# Patient Record
Sex: Female | Born: 1960
Health system: Southern US, Community
[De-identification: ages and names within clinical notes are randomized; demographics above are authoritative.]

## PROBLEM LIST (undated history)

## (undated) DIAGNOSIS — T8209XA Other mechanical complication of heart valve prosthesis, initial encounter: Secondary | ICD-10-CM

## (undated) DIAGNOSIS — I219 Acute myocardial infarction, unspecified: Secondary | ICD-10-CM

## (undated) DIAGNOSIS — C679 Malignant neoplasm of bladder, unspecified: Secondary | ICD-10-CM

## (undated) DIAGNOSIS — M1990 Unspecified osteoarthritis, unspecified site: Secondary | ICD-10-CM

## (undated) DIAGNOSIS — Z720 Tobacco use: Secondary | ICD-10-CM

## (undated) DIAGNOSIS — G629 Polyneuropathy, unspecified: Secondary | ICD-10-CM

## (undated) DIAGNOSIS — I472 Ventricular tachycardia, unspecified: Secondary | ICD-10-CM

## (undated) DIAGNOSIS — I480 Paroxysmal atrial fibrillation: Secondary | ICD-10-CM

## (undated) DIAGNOSIS — I2510 Atherosclerotic heart disease of native coronary artery without angina pectoris: Secondary | ICD-10-CM

## (undated) DIAGNOSIS — I5022 Chronic systolic (congestive) heart failure: Secondary | ICD-10-CM

## (undated) DIAGNOSIS — E7800 Pure hypercholesterolemia, unspecified: Secondary | ICD-10-CM

## (undated) DIAGNOSIS — Z95810 Presence of automatic (implantable) cardiac defibrillator: Secondary | ICD-10-CM

## (undated) DIAGNOSIS — E039 Hypothyroidism, unspecified: Secondary | ICD-10-CM

## (undated) DIAGNOSIS — Z953 Presence of xenogenic heart valve: Secondary | ICD-10-CM

## (undated) DIAGNOSIS — N183 Chronic kidney disease, stage 3 unspecified: Secondary | ICD-10-CM

## (undated) DIAGNOSIS — J45909 Unspecified asthma, uncomplicated: Secondary | ICD-10-CM

## (undated) DIAGNOSIS — I471 Supraventricular tachycardia, unspecified: Secondary | ICD-10-CM

## (undated) DIAGNOSIS — I255 Ischemic cardiomyopathy: Secondary | ICD-10-CM

## (undated) DIAGNOSIS — C569 Malignant neoplasm of unspecified ovary: Secondary | ICD-10-CM

## (undated) DIAGNOSIS — Z952 Presence of prosthetic heart valve: Secondary | ICD-10-CM

## (undated) DIAGNOSIS — Z9089 Acquired absence of other organs: Secondary | ICD-10-CM

## (undated) DIAGNOSIS — Z95828 Presence of other vascular implants and grafts: Secondary | ICD-10-CM

## (undated) DIAGNOSIS — Z923 Personal history of irradiation: Secondary | ICD-10-CM

## (undated) DIAGNOSIS — R52 Pain, unspecified: Secondary | ICD-10-CM

## (undated) HISTORY — PX: TONSILLECTOMY AND ADENOIDECTOMY: SUR1326

## (undated) HISTORY — DX: Atherosclerotic heart disease of native coronary artery without angina pectoris: I25.10

## (undated) HISTORY — DX: Presence of xenogenic heart valve: Z95.3

## (undated) HISTORY — DX: Other mechanical complication of heart valve prosthesis, initial encounter: T82.09XA

## (undated) HISTORY — PX: CARDIAC VALVE REPLACEMENT: SHX585

---

## 1987-06-25 HISTORY — PX: APPENDECTOMY: SHX54

## 1991-06-25 HISTORY — PX: TUBAL LIGATION: SHX77

## 1999-06-04 ENCOUNTER — Other Ambulatory Visit: Admission: RE | Admit: 1999-06-04 | Discharge: 1999-06-04 | Payer: Self-pay | Admitting: Obstetrics and Gynecology

## 1999-06-25 HISTORY — PX: VAGINAL HYSTERECTOMY: SUR661

## 1999-10-07 ENCOUNTER — Encounter: Payer: Self-pay | Admitting: Emergency Medicine

## 1999-10-07 ENCOUNTER — Emergency Department (HOSPITAL_COMMUNITY): Admission: EM | Admit: 1999-10-07 | Discharge: 1999-10-07 | Payer: Self-pay | Admitting: Emergency Medicine

## 1999-12-05 ENCOUNTER — Encounter: Payer: Self-pay | Admitting: Orthopedic Surgery

## 1999-12-05 ENCOUNTER — Ambulatory Visit (HOSPITAL_COMMUNITY): Admission: RE | Admit: 1999-12-05 | Discharge: 1999-12-05 | Payer: Self-pay | Admitting: Orthopedic Surgery

## 2000-01-03 ENCOUNTER — Encounter: Admission: RE | Admit: 2000-01-03 | Discharge: 2000-02-26 | Payer: Self-pay | Admitting: Orthopedic Surgery

## 2000-06-09 ENCOUNTER — Observation Stay (HOSPITAL_COMMUNITY): Admission: RE | Admit: 2000-06-09 | Discharge: 2000-06-10 | Payer: Self-pay | Admitting: Obstetrics and Gynecology

## 2000-06-09 ENCOUNTER — Encounter (INDEPENDENT_AMBULATORY_CARE_PROVIDER_SITE_OTHER): Payer: Self-pay | Admitting: Specialist

## 2003-06-14 ENCOUNTER — Encounter: Admission: RE | Admit: 2003-06-14 | Discharge: 2003-07-20 | Payer: Self-pay | Admitting: Family Medicine

## 2005-06-24 DIAGNOSIS — C569 Malignant neoplasm of unspecified ovary: Secondary | ICD-10-CM

## 2005-06-24 HISTORY — DX: Malignant neoplasm of unspecified ovary: C56.9

## 2005-06-24 HISTORY — PX: BILATERAL OOPHORECTOMY: SHX1221

## 2005-07-31 ENCOUNTER — Inpatient Hospital Stay (HOSPITAL_COMMUNITY): Admission: RE | Admit: 2005-07-31 | Discharge: 2005-08-02 | Payer: Self-pay | Admitting: Obstetrics and Gynecology

## 2005-07-31 ENCOUNTER — Encounter (INDEPENDENT_AMBULATORY_CARE_PROVIDER_SITE_OTHER): Payer: Self-pay | Admitting: *Deleted

## 2005-08-13 ENCOUNTER — Ambulatory Visit: Admission: RE | Admit: 2005-08-13 | Discharge: 2005-08-13 | Payer: Self-pay | Admitting: Gynecologic Oncology

## 2005-08-22 ENCOUNTER — Ambulatory Visit: Payer: Self-pay | Admitting: Hematology and Oncology

## 2005-08-27 ENCOUNTER — Ambulatory Visit (HOSPITAL_COMMUNITY): Admission: RE | Admit: 2005-08-27 | Discharge: 2005-08-27 | Payer: Self-pay | Admitting: Hematology and Oncology

## 2005-08-28 ENCOUNTER — Ambulatory Visit: Admission: RE | Admit: 2005-08-28 | Discharge: 2005-08-28 | Payer: Self-pay | Admitting: Hematology and Oncology

## 2005-08-29 ENCOUNTER — Ambulatory Visit (HOSPITAL_COMMUNITY): Admission: RE | Admit: 2005-08-29 | Discharge: 2005-08-29 | Payer: Self-pay | Admitting: Hematology and Oncology

## 2005-08-30 ENCOUNTER — Ambulatory Visit (HOSPITAL_COMMUNITY): Admission: RE | Admit: 2005-08-30 | Discharge: 2005-08-30 | Payer: Self-pay | Admitting: Hematology and Oncology

## 2005-09-02 ENCOUNTER — Inpatient Hospital Stay (HOSPITAL_COMMUNITY): Admission: EM | Admit: 2005-09-02 | Discharge: 2005-09-06 | Payer: Self-pay | Admitting: Hematology and Oncology

## 2005-09-06 ENCOUNTER — Ambulatory Visit: Payer: Self-pay | Admitting: Hematology and Oncology

## 2005-09-23 ENCOUNTER — Inpatient Hospital Stay (HOSPITAL_COMMUNITY): Admission: EM | Admit: 2005-09-23 | Discharge: 2005-09-27 | Payer: Self-pay | Admitting: Hematology and Oncology

## 2005-10-04 LAB — CBC WITH DIFFERENTIAL/PLATELET
BASO%: 0.2 % (ref 0.0–2.0)
Basophils Absolute: 0 10*3/uL (ref 0.0–0.1)
EOS%: 0 % (ref 0.0–7.0)
HCT: 30.7 % — ABNORMAL LOW (ref 34.8–46.6)
HGB: 10.6 g/dL — ABNORMAL LOW (ref 11.6–15.9)
LYMPH%: 23.4 % (ref 14.0–48.0)
MCH: 31.4 pg (ref 26.0–34.0)
MCHC: 34.5 g/dL (ref 32.0–36.0)
MCV: 91 fL (ref 81.0–101.0)
NEUT%: 71.1 % (ref 39.6–76.8)
Platelets: 90 10*3/uL — ABNORMAL LOW (ref 145–400)

## 2005-10-04 LAB — BASIC METABOLIC PANEL
BUN: 9 mg/dL (ref 6–23)
Creatinine, Ser: 0.7 mg/dL (ref 0.4–1.2)
Potassium: 3.2 mEq/L — ABNORMAL LOW (ref 3.5–5.3)

## 2005-10-07 ENCOUNTER — Ambulatory Visit: Payer: Self-pay | Admitting: Hematology and Oncology

## 2005-10-09 LAB — CBC WITH DIFFERENTIAL/PLATELET
BASO%: 0.4 % (ref 0.0–2.0)
Basophils Absolute: 0 10*3/uL (ref 0.0–0.1)
Eosinophils Absolute: 0 10*3/uL (ref 0.0–0.5)
HCT: 30.6 % — ABNORMAL LOW (ref 34.8–46.6)
HGB: 10.5 g/dL — ABNORMAL LOW (ref 11.6–15.9)
LYMPH%: 46.9 % (ref 14.0–48.0)
MCHC: 34.3 g/dL (ref 32.0–36.0)
MONO#: 0.7 10*3/uL (ref 0.1–0.9)
NEUT%: 31.2 % — ABNORMAL LOW (ref 39.6–76.8)
Platelets: 152 10*3/uL (ref 145–400)
WBC: 3.2 10*3/uL — ABNORMAL LOW (ref 3.9–10.0)
lymph#: 1.5 10*3/uL (ref 0.9–3.3)

## 2005-10-11 LAB — CBC WITH DIFFERENTIAL/PLATELET
Eosinophils Absolute: 0 10*3/uL (ref 0.0–0.5)
MONO#: 2 10*3/uL — ABNORMAL HIGH (ref 0.1–0.9)
NEUT#: 26 10*3/uL — ABNORMAL HIGH (ref 1.5–6.5)
RBC: 3.81 10*6/uL (ref 3.70–5.32)
RDW: 13.3 % (ref 11.3–14.5)
WBC: 31.2 10*3/uL — ABNORMAL HIGH (ref 3.9–10.0)
lymph#: 3.2 10*3/uL (ref 0.9–3.3)

## 2005-10-14 ENCOUNTER — Inpatient Hospital Stay (HOSPITAL_COMMUNITY): Admission: EM | Admit: 2005-10-14 | Discharge: 2005-10-18 | Payer: Self-pay | Admitting: Hematology and Oncology

## 2005-10-14 ENCOUNTER — Ambulatory Visit: Payer: Self-pay | Admitting: Hematology and Oncology

## 2005-10-20 ENCOUNTER — Observation Stay (HOSPITAL_COMMUNITY): Admission: EM | Admit: 2005-10-20 | Discharge: 2005-10-21 | Payer: Self-pay | Admitting: Emergency Medicine

## 2005-10-25 LAB — COMPREHENSIVE METABOLIC PANEL
ALT: 8 U/L (ref 0–40)
AST: 10 U/L (ref 0–37)
Albumin: 3.7 g/dL (ref 3.5–5.2)
Alkaline Phosphatase: 68 U/L (ref 39–117)
Calcium: 8.8 mg/dL (ref 8.4–10.5)
Chloride: 98 mEq/L (ref 96–112)
Creatinine, Ser: 0.6 mg/dL (ref 0.4–1.2)
Potassium: 3.5 mEq/L (ref 3.5–5.3)

## 2005-10-25 LAB — CBC WITH DIFFERENTIAL/PLATELET
BASO%: 0.2 % (ref 0.0–2.0)
Basophils Absolute: 0 10*3/uL (ref 0.0–0.1)
EOS%: 0.1 % (ref 0.0–7.0)
Eosinophils Absolute: 0 10*3/uL (ref 0.0–0.5)
HCT: 29.2 % — ABNORMAL LOW (ref 34.8–46.6)
HGB: 10.1 g/dL — ABNORMAL LOW (ref 11.6–15.9)
LYMPH%: 15.4 % (ref 14.0–48.0)
MCH: 30.9 pg (ref 26.0–34.0)
MCHC: 34.5 g/dL (ref 32.0–36.0)
MCV: 89.4 fL (ref 81.0–101.0)
MONO#: 0.2 10*3/uL (ref 0.1–0.9)
MONO%: 2.3 % (ref 0.0–13.0)
NEUT#: 8.6 10*3/uL — ABNORMAL HIGH (ref 1.5–6.5)
NEUT%: 82 % — ABNORMAL HIGH (ref 39.6–76.8)
Platelets: 45 10*3/uL — ABNORMAL LOW (ref 145–400)
RBC: 3.27 10*6/uL — ABNORMAL LOW (ref 3.70–5.32)
RDW: 14.1 % (ref 11.3–14.5)
WBC: 10.4 10*3/uL — ABNORMAL HIGH (ref 3.9–10.0)
lymph#: 1.6 10*3/uL (ref 0.9–3.3)

## 2005-10-31 LAB — COMPREHENSIVE METABOLIC PANEL
ALT: 15 U/L (ref 0–40)
BUN: 4 mg/dL — ABNORMAL LOW (ref 6–23)
CO2: 27 mEq/L (ref 19–32)
Creatinine, Ser: 0.7 mg/dL (ref 0.4–1.2)
Total Bilirubin: 0.3 mg/dL (ref 0.3–1.2)

## 2005-10-31 LAB — CBC WITH DIFFERENTIAL/PLATELET
BASO%: 0.1 % (ref 0.0–2.0)
Basophils Absolute: 0 10*3/uL (ref 0.0–0.1)
EOS%: 0.2 % (ref 0.0–7.0)
HCT: 29.1 % — ABNORMAL LOW (ref 34.8–46.6)
LYMPH%: 18.2 % (ref 14.0–48.0)
MCH: 31.1 pg (ref 26.0–34.0)
MCHC: 33.9 g/dL (ref 32.0–36.0)
MONO#: 0.8 10*3/uL (ref 0.1–0.9)
NEUT%: 73.8 % (ref 39.6–76.8)
Platelets: 178 10*3/uL (ref 145–400)

## 2005-11-26 ENCOUNTER — Ambulatory Visit: Payer: Self-pay | Admitting: Hematology and Oncology

## 2005-11-28 ENCOUNTER — Ambulatory Visit (HOSPITAL_COMMUNITY): Admission: RE | Admit: 2005-11-28 | Discharge: 2005-11-28 | Payer: Self-pay | Admitting: Hematology and Oncology

## 2005-11-28 LAB — COMPREHENSIVE METABOLIC PANEL
ALT: 19 U/L (ref 0–40)
Albumin: 4.1 g/dL (ref 3.5–5.2)
CO2: 23 mEq/L (ref 19–32)
Calcium: 8.9 mg/dL (ref 8.4–10.5)
Chloride: 107 mEq/L (ref 96–112)
Potassium: 3.9 mEq/L (ref 3.5–5.3)
Sodium: 136 mEq/L (ref 135–145)
Total Protein: 6.5 g/dL (ref 6.0–8.3)

## 2005-11-28 LAB — CBC WITH DIFFERENTIAL/PLATELET
BASO%: 0.7 % (ref 0.0–2.0)
HCT: 34.7 % — ABNORMAL LOW (ref 34.8–46.6)
MCHC: 34.1 g/dL (ref 32.0–36.0)
MONO#: 0.6 10*3/uL (ref 0.1–0.9)
NEUT%: 62 % (ref 39.6–76.8)
RDW: 18.8 % — ABNORMAL HIGH (ref 11.3–14.5)
WBC: 5.7 10*3/uL (ref 3.9–10.0)
lymph#: 1.5 10*3/uL (ref 0.9–3.3)

## 2005-11-28 LAB — LACTATE DEHYDROGENASE: LDH: 193 U/L (ref 94–250)

## 2005-12-04 ENCOUNTER — Ambulatory Visit (HOSPITAL_COMMUNITY): Admission: RE | Admit: 2005-12-04 | Discharge: 2005-12-04 | Payer: Self-pay | Admitting: Hematology and Oncology

## 2005-12-31 ENCOUNTER — Ambulatory Visit: Admission: RE | Admit: 2005-12-31 | Discharge: 2005-12-31 | Payer: Self-pay | Admitting: Gynecologic Oncology

## 2006-01-16 ENCOUNTER — Ambulatory Visit: Payer: Self-pay | Admitting: Hematology and Oncology

## 2006-02-28 ENCOUNTER — Ambulatory Visit (HOSPITAL_COMMUNITY): Admission: RE | Admit: 2006-02-28 | Discharge: 2006-02-28 | Payer: Self-pay | Admitting: Hematology and Oncology

## 2006-02-28 LAB — CBC WITH DIFFERENTIAL/PLATELET
BASO%: 0.7 % (ref 0.0–2.0)
Basophils Absolute: 0.1 10*3/uL (ref 0.0–0.1)
HCT: 41.2 % (ref 34.8–46.6)
LYMPH%: 27 % (ref 14.0–48.0)
MCH: 31.8 pg (ref 26.0–34.0)
MCHC: 35 g/dL (ref 32.0–36.0)
NEUT#: 5.1 10*3/uL (ref 1.5–6.5)
lymph#: 2.1 10*3/uL (ref 0.9–3.3)

## 2006-03-02 LAB — COMPREHENSIVE METABOLIC PANEL
AST: 14 U/L (ref 0–37)
CO2: 25 mEq/L (ref 19–32)
Calcium: 9.3 mg/dL (ref 8.4–10.5)
Chloride: 108 mEq/L (ref 96–112)
Glucose, Bld: 87 mg/dL (ref 70–99)
Potassium: 4.2 mEq/L (ref 3.5–5.3)
Total Protein: 6.3 g/dL (ref 6.0–8.3)

## 2006-03-02 LAB — AFP TUMOR MARKER: AFP-Tumor Marker: 2.8 ng/mL (ref 0.0–8.0)

## 2006-03-02 LAB — BETA HCG QUANT (REF LAB): Beta hCG, Tumor Marker: 1.6 m[IU]/mL (ref <5.0)

## 2006-03-05 ENCOUNTER — Ambulatory Visit: Payer: Self-pay | Admitting: Hematology and Oncology

## 2006-03-17 ENCOUNTER — Ambulatory Visit (HOSPITAL_COMMUNITY): Admission: RE | Admit: 2006-03-17 | Discharge: 2006-03-17 | Payer: Self-pay | Admitting: Hematology and Oncology

## 2006-05-22 ENCOUNTER — Observation Stay (HOSPITAL_COMMUNITY): Admission: EM | Admit: 2006-05-22 | Discharge: 2006-05-23 | Payer: Self-pay | Admitting: Emergency Medicine

## 2006-06-03 ENCOUNTER — Ambulatory Visit: Admission: RE | Admit: 2006-06-03 | Discharge: 2006-06-03 | Payer: Self-pay | Admitting: Gynecologic Oncology

## 2006-06-04 ENCOUNTER — Encounter: Admission: RE | Admit: 2006-06-04 | Discharge: 2006-06-04 | Payer: Self-pay | Admitting: Thoracic Surgery

## 2006-06-19 ENCOUNTER — Encounter (INDEPENDENT_AMBULATORY_CARE_PROVIDER_SITE_OTHER): Payer: Self-pay | Admitting: *Deleted

## 2006-06-19 ENCOUNTER — Inpatient Hospital Stay (HOSPITAL_COMMUNITY): Admission: RE | Admit: 2006-06-19 | Discharge: 2006-06-23 | Payer: Self-pay | Admitting: Thoracic Surgery

## 2006-07-01 ENCOUNTER — Encounter: Admission: RE | Admit: 2006-07-01 | Discharge: 2006-07-01 | Payer: Self-pay | Admitting: Thoracic Surgery

## 2006-08-06 ENCOUNTER — Ambulatory Visit: Payer: Self-pay | Admitting: Thoracic Surgery

## 2006-08-06 ENCOUNTER — Encounter: Admission: RE | Admit: 2006-08-06 | Discharge: 2006-08-06 | Payer: Self-pay | Admitting: Thoracic Surgery

## 2006-08-29 ENCOUNTER — Ambulatory Visit: Payer: Self-pay | Admitting: Hematology and Oncology

## 2006-09-02 ENCOUNTER — Ambulatory Visit (HOSPITAL_COMMUNITY): Admission: RE | Admit: 2006-09-02 | Discharge: 2006-09-02 | Payer: Self-pay | Admitting: Hematology and Oncology

## 2006-09-02 LAB — CBC WITH DIFFERENTIAL/PLATELET
Eosinophils Absolute: 0.1 10*3/uL (ref 0.0–0.5)
HCT: 38.7 % (ref 34.8–46.6)
LYMPH%: 35 % (ref 14.0–48.0)
MONO#: 0.6 10*3/uL (ref 0.1–0.9)
NEUT#: 4 10*3/uL (ref 1.5–6.5)
NEUT%: 54.9 % (ref 39.6–76.8)
Platelets: 217 10*3/uL (ref 145–400)
WBC: 7.3 10*3/uL (ref 3.9–10.0)

## 2006-09-02 LAB — COMPREHENSIVE METABOLIC PANEL
AST: 16 U/L (ref 0–37)
Albumin: 3.6 g/dL (ref 3.5–5.2)
Alkaline Phosphatase: 113 U/L (ref 39–117)
BUN: 7 mg/dL (ref 6–23)
Potassium: 4.4 mEq/L (ref 3.5–5.3)
Total Bilirubin: 0.6 mg/dL (ref 0.3–1.2)

## 2006-09-05 LAB — CA 125: CA 125: 10.2 U/mL (ref 0.0–30.2)

## 2006-12-09 ENCOUNTER — Ambulatory Visit: Admission: RE | Admit: 2006-12-09 | Discharge: 2006-12-09 | Payer: Self-pay | Admitting: Gynecologic Oncology

## 2007-02-27 ENCOUNTER — Ambulatory Visit: Payer: Self-pay | Admitting: Hematology and Oncology

## 2007-03-03 LAB — COMPREHENSIVE METABOLIC PANEL
AST: 17 U/L (ref 0–37)
Albumin: 3.6 g/dL (ref 3.5–5.2)
Alkaline Phosphatase: 98 U/L (ref 39–117)
BUN: 6 mg/dL (ref 6–23)
Creatinine, Ser: 0.82 mg/dL (ref 0.40–1.20)
Glucose, Bld: 98 mg/dL (ref 70–99)
Total Bilirubin: 0.7 mg/dL (ref 0.3–1.2)

## 2007-03-03 LAB — CBC WITH DIFFERENTIAL/PLATELET
Basophils Absolute: 0 10*3/uL (ref 0.0–0.1)
EOS%: 1.2 % (ref 0.0–7.0)
Eosinophils Absolute: 0.1 10*3/uL (ref 0.0–0.5)
HCT: 38.7 % (ref 34.8–46.6)
HGB: 14.1 g/dL (ref 11.6–15.9)
LYMPH%: 30.2 % (ref 14.0–48.0)
MCH: 33.8 pg (ref 26.0–34.0)
MCV: 92.7 fL (ref 81.0–101.0)
MONO%: 7 % (ref 0.0–13.0)
NEUT#: 4.8 10*3/uL (ref 1.5–6.5)
NEUT%: 61.2 % (ref 39.6–76.8)
Platelets: 183 10*3/uL (ref 145–400)
RDW: 12.8 % (ref 11.3–14.5)

## 2007-06-25 DIAGNOSIS — C679 Malignant neoplasm of bladder, unspecified: Secondary | ICD-10-CM

## 2007-06-25 HISTORY — PX: THYMECTOMY: SHX1063

## 2007-06-25 HISTORY — DX: Malignant neoplasm of bladder, unspecified: C67.9

## 2007-06-25 HISTORY — PX: TRANSURETHRAL RESECTION OF BLADDER: SUR1395

## 2007-08-06 ENCOUNTER — Encounter: Admission: RE | Admit: 2007-08-06 | Discharge: 2007-08-06 | Payer: Self-pay | Admitting: Orthopedic Surgery

## 2007-08-25 ENCOUNTER — Ambulatory Visit: Admission: RE | Admit: 2007-08-25 | Discharge: 2007-08-25 | Payer: Self-pay | Admitting: Gynecologic Oncology

## 2007-09-22 ENCOUNTER — Ambulatory Visit (HOSPITAL_BASED_OUTPATIENT_CLINIC_OR_DEPARTMENT_OTHER): Admission: RE | Admit: 2007-09-22 | Discharge: 2007-09-22 | Payer: Self-pay | Admitting: Urology

## 2007-09-22 ENCOUNTER — Encounter (INDEPENDENT_AMBULATORY_CARE_PROVIDER_SITE_OTHER): Payer: Self-pay | Admitting: Urology

## 2008-01-26 ENCOUNTER — Encounter (INDEPENDENT_AMBULATORY_CARE_PROVIDER_SITE_OTHER): Payer: Self-pay | Admitting: Urology

## 2008-01-26 ENCOUNTER — Ambulatory Visit (HOSPITAL_BASED_OUTPATIENT_CLINIC_OR_DEPARTMENT_OTHER): Admission: RE | Admit: 2008-01-26 | Discharge: 2008-01-26 | Payer: Self-pay | Admitting: Urology

## 2008-02-23 ENCOUNTER — Ambulatory Visit: Admission: RE | Admit: 2008-02-23 | Discharge: 2008-02-23 | Payer: Self-pay | Admitting: Gynecologic Oncology

## 2008-12-01 ENCOUNTER — Ambulatory Visit: Admission: RE | Admit: 2008-12-01 | Discharge: 2008-12-01 | Payer: Self-pay | Admitting: Gynecologic Oncology

## 2009-06-24 DIAGNOSIS — I219 Acute myocardial infarction, unspecified: Secondary | ICD-10-CM

## 2009-06-24 HISTORY — DX: Acute myocardial infarction, unspecified: I21.9

## 2009-08-09 ENCOUNTER — Ambulatory Visit: Admission: RE | Admit: 2009-08-09 | Discharge: 2009-08-09 | Payer: Self-pay | Admitting: Gynecologic Oncology

## 2009-10-20 ENCOUNTER — Encounter: Payer: Self-pay | Admitting: Internal Medicine

## 2009-10-21 DIAGNOSIS — Z953 Presence of xenogenic heart valve: Secondary | ICD-10-CM

## 2009-10-21 HISTORY — PX: MITRAL VALVE REPLACEMENT: SHX147

## 2009-10-21 HISTORY — DX: Presence of xenogenic heart valve: Z95.3

## 2009-10-22 HISTORY — PX: INSERT / REPLACE / REMOVE PACEMAKER: SUR710

## 2009-11-07 DIAGNOSIS — I5022 Chronic systolic (congestive) heart failure: Secondary | ICD-10-CM | POA: Insufficient documentation

## 2009-11-07 DIAGNOSIS — I472 Ventricular tachycardia, unspecified: Secondary | ICD-10-CM | POA: Insufficient documentation

## 2009-11-07 DIAGNOSIS — I4901 Ventricular fibrillation: Secondary | ICD-10-CM | POA: Insufficient documentation

## 2009-11-07 DIAGNOSIS — I25718 Atherosclerosis of autologous vein coronary artery bypass graft(s) with other forms of angina pectoris: Secondary | ICD-10-CM | POA: Insufficient documentation

## 2009-11-08 ENCOUNTER — Ambulatory Visit: Payer: Self-pay | Admitting: Internal Medicine

## 2009-11-08 DIAGNOSIS — Z95810 Presence of automatic (implantable) cardiac defibrillator: Secondary | ICD-10-CM | POA: Insufficient documentation

## 2009-11-16 ENCOUNTER — Encounter (HOSPITAL_COMMUNITY): Admission: RE | Admit: 2009-11-16 | Discharge: 2010-02-14 | Payer: Self-pay | Admitting: Interventional Cardiology

## 2010-02-15 ENCOUNTER — Encounter (HOSPITAL_COMMUNITY): Admission: RE | Admit: 2010-02-15 | Discharge: 2010-04-23 | Payer: Self-pay | Admitting: Interventional Cardiology

## 2010-02-21 ENCOUNTER — Ambulatory Visit: Payer: Self-pay | Admitting: Internal Medicine

## 2010-03-06 ENCOUNTER — Ambulatory Visit: Payer: Self-pay

## 2010-03-07 ENCOUNTER — Ambulatory Visit: Payer: Self-pay | Admitting: Internal Medicine

## 2010-03-21 ENCOUNTER — Telehealth (INDEPENDENT_AMBULATORY_CARE_PROVIDER_SITE_OTHER): Payer: Self-pay | Admitting: *Deleted

## 2010-07-15 ENCOUNTER — Encounter: Payer: Self-pay | Admitting: Hematology and Oncology

## 2010-07-16 ENCOUNTER — Encounter: Payer: Self-pay | Admitting: Hematology and Oncology

## 2010-07-26 NOTE — Assessment & Plan Note (Signed)
Summary: defib check   Visit Type:  Follow-up Primary Provider:  Dewain Penning, MD   History of Present Illness: [name] returns today for followup.   She is a pleasant 50 yo woman with an ICM, VT, s/p ICD implant and dyslipidemia.  The patient has done well since I last saw her.  She is walking regularly and denies c/p or sob.  No intercurrent ICD shocks.  Since I last saw her she has done well.  She has gone down to 200 mg a day of amiodarone and has tolerated this nicely.  Current Medications (verified): 1)  Simvastatin 40 Mg Tabs (Simvastatin) .... Take One Tablet By Mouth Daily At Bedtime 2)  Amiodarone Hcl 200 Mg Tabs (Amiodarone Hcl) .Marland Kitchen.. 1 Tab Once Daily 3)  Alprazolam 0.5 Mg Tabs (Alprazolam) .... As Needed 4)  Carvedilol 3.125 Mg Tabs (Carvedilol) .... Take One Tablet By Mouth Twice A Day 5)  Aspirin Ec 325 Mg Tbec (Aspirin) .... Take One Tablet By Mouth Daily 6)  Diclofenac Sodium 75 Mg Tbec (Diclofenac Sodium) .... Take One Tablet By Mouth Twice Daily. 7)  Furosemide 40 Mg Tabs (Furosemide) .... Take One Tablet By Mouth Daily. 8)  Potassium Chloride Crys Cr 20 Meq Cr-Tabs (Potassium Chloride Crys Cr) .... Take One Tablet By Mouth Daily 9)  Lexapro 10 Mg Tabs (Escitalopram Oxalate) .... Once Daily 10)  Krill Oil 1000 Mg Caps (Krill Oil) .... 2000mg  Once Daily 11)  Multivitamins   Tabs (Multiple Vitamin) .... Once Daily  Allergies (verified): 1)  ! Compazine 2)  ! * Ramapril 3)  ! * [place]  Past History:  Past Medical History: Last updated: 11/07/2009 Current Problems:  VENTRICULAR TACHYCARDIA (ICD-427.1) UNSPECIFIED SYSTOLIC HEART FAILURE (ICD-428.20) HEART VALVE REPLACED BY OTHER MEANS (ICD-V43.3) VENTRICULAR FIBRILLATION (ICD-427.41) CAD (ICD-414.00)    Past Surgical History: Last updated: 11/07/2009 Implantation of dual-chamber  St. Jude defibrillator  Review of Systems  The patient denies chest pain, syncope, dyspnea on exertion, and peripheral edema.     Vital Signs:  Patient profile:   50 year old female Height:      66 inches Weight:      201 pounds BMI:     32.56 Pulse rate:   74 / minute BP sitting:   100 / 70  (left arm)  Vitals Entered By: Laurance Flatten CMA (February 21, 2010 11:20 AM)  Physical Exam  General:  Well developed, well nourished, in no acute distress.  HEENT: normal Neck: supple. No JVD. Carotids 2+ bilaterally no bruits Cor: RRR no rubs, gallops or murmur Lungs: CTA. Well healed ICD incision. Ab: soft, nontender. nondistended. No HSM. Good bowel sounds Ext: warm. no cyanosis, clubbing or edema Neuro: alert and oriented. Grossly nonfocal. affect pleasant     ICD Specifications Following MD:  Lewayne Bunting, MD     ICD Vendor:  St Jude     ICD Model Number:  [phone]     ICD Serial Number:  [id number] ICD DOI:  10/24/2009     ICD Implanting MD:  Lewayne Bunting, MD  Lead 1:    Location: RA     DOI: 10/24/2009     Model #: 1888TC     Serial #: [id number]     Status: active Lead 2:    Location: RV     DOI: 10/24/2009     Model #: 0102V     Serial #: [id number]     Status: active  ICD Follow Up Battery Voltage:  95%  V     Charge Time:  8.5 seconds     Battery Est. Longevity:  4.9-9.3 YRS Underlying rhythm:  SR   ICD Device Measurements Atrium:  Amplitude: 4.2 mV, Impedance: 390 ohms, Threshold: 2.0 V at 0.5 msec Right Ventricle:  Amplitude: 12.0 mV, Impedance: 440 ohms, Threshold: 1.25 V at 0.5 msec Shock Impedance: 43 ohms   Episodes MS Episodes:  0     Shock:  0     ATP:  0     Nonsustained:  0     Atrial Therapies:  0 Atrial Pacing:  <1%     Ventricular Pacing:  <1%  Brady Parameters Mode DDD     Lower Rate Limit:  60     Upper Rate Limit 100 PAV 350     Sensed AV Delay:  325  Tachy Zones VF:  214     VT:  171     Next Cardiology Appt Due:  08/23/2010 Tech Comments:  NO EPISODES SINCE LAST CHECK.  NORMAL DEVICE FUNCTION--RA THRESHOLD 2.0 @ 0.5--PER GT RA OUTPUT AT 2.0 V.  RV THRESHOLD 1.25 @ 0.5.  [facility]  CHECK 05-24-10.  ROV IN 6 MTHS W/GT. Vella Kohler  February 21, 2010 11:54 AM MD Comments:  Agree with above.  Impression & Recommendations:  Problem # 1:  AUTOMATIC IMPLANTABLE CARDIAC DEFIBRILLATOR SITU (ICD-V45.02) Her device is working normally.  Will recheck in several months.  Problem # 2:  VENTRICULAR TACHYCARDIA (ICD-427.1) Since we last saw her she has had no recurrent ICD therapies.  I plan to wean her amiodarone down when I see her next. Her updated medication list for this problem includes:    Amiodarone Hcl 200 Mg Tabs (Amiodarone hcl) .Marland Kitchen... 1 tab once daily    Carvedilol 3.125 Mg Tabs (Carvedilol) .Marland Kitchen... Take one tablet by mouth twice a day    Aspirin Ec 325 Mg Tbec (Aspirin) .Marland Kitchen... Take one tablet by mouth daily  Problem # 3:  UNSPECIFIED SYSTOLIC HEART FAILURE (ICD-428.20) She remains class 1-2.  Continue meds as below and followup with Dr. Katrinka Blazing. Her updated medication list for this problem includes:    Amiodarone Hcl 200 Mg Tabs (Amiodarone hcl) .Marland Kitchen... 1 tab once daily    Carvedilol 3.125 Mg Tabs (Carvedilol) .Marland Kitchen... Take one tablet by mouth twice a day    Aspirin Ec 325 Mg Tbec (Aspirin) .Marland Kitchen... Take one tablet by mouth daily    Furosemide 40 Mg Tabs (Furosemide) .Marland Kitchen... Take one tablet by mouth daily.  Patient Instructions: 1)  Your physician recommends that you schedule a follow-up appointment in: 6 months 2)  Your physician recommends that you continue on your current medications as directed. Please refer to the Current Medication list given to you today.

## 2010-07-26 NOTE — Assessment & Plan Note (Signed)
Summary: device migration   Visit Type:  Follow-up Primary [name]:  Stacy Penning, MD  CC:  had dizzy spell went patient got out of car.  History of Present Illness: [name] returns today for followup.   She is a pleasant 50 yo woman with an ICM, VT, s/p ICD implant and dyslipidemia.  The patient has done well since I last saw her.  She is walking regularly and denies c/p or sob.  No intercurrent ICD shocks.  She has gone down to 200 mg a day of amiodarone and has tolerated this nicely.  Since I last saw her she notes that her device has shifted a bit and a portion of the top part of the device appears to be pressing on her skin. She denies fever,chills, drainage, or tenderness.  Current Medications (verified): 1)  Simvastatin 40 Mg Tabs (Simvastatin) .... Take One Tablet By Mouth Daily At Bedtime 2)  Amiodarone Hcl 200 Mg Tabs (Amiodarone Hcl) .Marland Kitchen.. 1 Tab Once Daily 3)  Alprazolam 0.5 Mg Tabs (Alprazolam) .... As Needed 4)  Carvedilol 3.125 Mg Tabs (Carvedilol) .... Take One Tablet By Mouth Twice A Day 5)  Aspirin Ec 325 Mg Tbec (Aspirin) .... Take One Tablet By Mouth Daily 6)  Diclofenac Sodium 75 Mg Tbec (Diclofenac Sodium) .... Take One Tablet By Mouth Twice Daily. 7)  Furosemide 40 Mg Tabs (Furosemide) .... Take One Tablet By Mouth Daily. 8)  Potassium Chloride Crys Cr 20 Meq Cr-Tabs (Potassium Chloride Crys Cr) .... Take One Tablet By Mouth Daily 9)  Lexapro 10 Mg Tabs (Escitalopram Oxalate) .... Once Daily 10)  Krill Oil 1000 Mg Caps (Krill Oil) .... 2000mg  Once Daily 11)  Multivitamins   Tabs (Multiple Vitamin) .... Once Daily  Allergies (verified): 1)  ! Compazine 2)  ! * Ramapril 3)  ! * [place]  Past History:  Past Medical History: Last updated: 11/07/2009 Current Problems:  VENTRICULAR TACHYCARDIA (ICD-427.1) UNSPECIFIED SYSTOLIC HEART FAILURE (ICD-428.20) HEART VALVE REPLACED BY OTHER MEANS (ICD-V43.3) VENTRICULAR FIBRILLATION (ICD-427.41) CAD (ICD-414.00)     Past Surgical History: Last updated: 11/07/2009 Implantation of dual-chamber  St. Jude defibrillator  Review of Systems  The patient denies chest pain, syncope, dyspnea on exertion, and peripheral edema.    Vital Signs:  Patient profile:   50 year old female Weight:      204 pounds Pulse rate:   71 / minute BP sitting:   99 / 70  (right arm)  Vitals Entered By: Dreama Saa, CNA (March 07, 2010 1:36 PM)  Physical Exam  General:  Well developed, well nourished, in no acute distress.  HEENT: normal Neck: supple. No JVD. Carotids 2+ bilaterally no bruits Cor: RRR no rubs, gallops or murmur Lungs: CTA. Well healed ICD incision except that there is mild thinning on the superior aspect of the incision. Ab: soft, nontender. nondistended. No HSM. Good bowel sounds Ext: warm. no cyanosis, clubbing or edema Neuro: alert and oriented. Grossly nonfocal. affect pleasant     ICD Specifications Following MD:  Lewayne Bunting, MD     ICD Vendor:  St Jude     ICD Model Number:  [phone]     ICD Serial Number:  [id number] ICD DOI:  10/24/2009     ICD Implanting MD:  Lewayne Bunting, MD  Lead 1:    Location: RA     DOI: 10/24/2009     Model #: 1888TC     Serial #: [id number]     Status: active Lead 2:  Location: RV     DOI: 10/24/2009     Model #: 1610R     Serial #: [id number]     Status: active  Brady Parameters Mode DDD     Lower Rate Limit:  60     Upper Rate Limit 100 PAV 350     Sensed AV Delay:  325  Tachy Zones VF:  214     VT:  171     Impression & Recommendations:  Problem # 1:  AUTOMATIC IMPLANTABLE CARDIAC DEFIBRILLATOR SITU (ICD-V45.02) Her device is working normally.  The thinning of the superior portion of her incision is likely due to movement and settling of the device.  There does not appear to be any active infection.  Will recommend a period of watchful waiting.  Problem # 2:  VENTRICULAR TACHYCARDIA (ICD-427.1) Her symptoms are stable.  Will continue meds as  below. Her updated medication list for this problem includes:    Amiodarone Hcl 200 Mg Tabs (Amiodarone hcl) .Marland Kitchen... 1 tab once daily    Carvedilol 3.125 Mg Tabs (Carvedilol) .Marland Kitchen... Take one tablet by mouth twice a day    Aspirin Ec 325 Mg Tbec (Aspirin) .Marland Kitchen... Take one tablet by mouth daily  Problem # 3:  CAD (ICD-414.00) She currently denies anginal symptoms. Her updated medication list for this problem includes:    Carvedilol 3.125 Mg Tabs (Carvedilol) .Marland Kitchen... Take one tablet by mouth twice a day    Aspirin Ec 325 Mg Tbec (Aspirin) .Marland Kitchen... Take one tablet by mouth daily  Patient Instructions: 1)  Your physician recommends that you schedule a follow-up appointment in: as scheduled

## 2010-07-26 NOTE — Letter (Signed)
Summary: operative report 10-21-09  operative report 10-21-09   Imported By: Faythe Ghee 11/08/2009 15:59:48  _____________________________________________________________________  External Attachment:    Type:   Image     Comment:   External Document

## 2010-07-26 NOTE — Letter (Signed)
Summary: ekg 11-01-09  ekg 11-01-09   Imported By: Faythe Ghee 11/08/2009 15:59:00  _____________________________________________________________________  External Attachment:    Type:   Image     Comment:   External Document

## 2010-07-26 NOTE — Letter (Signed)
Summary: progress note from [facility] physicians 11-01-09  progress note from [facility] physicians 11-01-09   Imported By: Faythe Ghee 11/08/2009 15:58:38  _____________________________________________________________________  External Attachment:    Type:   Image     Comment:   External Document

## 2010-07-26 NOTE — Procedures (Signed)
Summary: Cardiology Device Clinic    ICD Specifications Following MD:  Lewayne Bunting, MD      ICD Follow Up Battery Est. Longevity:  4.8-10.69YRS Underlying rhythm:  SR   ICD Device Measurements Atrium:  Amplitude: 2.7 mV, Impedance: 310 ohms, Threshold: 0.5 V at 0.5 msec Right Ventricle:  Amplitude: 12.0 mV, Impedance: 440 ohms, Threshold: 1.0 V at 0.5 msec Shock Impedance: 37 ohms   Episodes MS Episodes:  0     Percent Mode Switch:  0     Shock:  0     ATP:  0     Nonsustained:  0     Atrial Therapies:  0 Next Cardiology Appt Due:  01/22/2010 Tech Comments:  NORMAL DEVICE FUNCTION.  CHANGES: TURNED OFF ACAP CONFIRM--A AMPLITUDE SET AT 3.5 V/PAV DELAY TO 350/SAV DELAY TO 325/POST SHOCK A PULSE AMPLITUDE TO 5.0 @ 1.18ms/POST SHOCK BASE RATE 70bpm/AMS BASE RATE TO 70bpm/PMT DETECTION RATE 100bpm/RATE RESPONSIVE & SHORTEST PVARP/VREF OFF/RV AMPLITUDE TO 3.5 V/VT THERAPY 1 AND 2 ON/VT ENERGY CHANGED 3 TO 20 J AND 4 TO 30J. # OF INTERVALS FROM 16 TO 24/VF THERAPY 1 BURST CYCLE LENGTH TO 84%. ROV in 3 mths.[name]  Nov 09, 2009 3:35 PM   MD Comments:  Agree with above.

## 2010-07-26 NOTE — Assessment & Plan Note (Signed)
Summary: appt @ 12:15/nep/pt has defib st jude   Visit Type:  Initial Consult   History of Present Illness: Mrs. Swartz is referred today by Dr. Katrinka Blazing for evaluation of VT in the setting of a prior MI complicated by pseudoanuerysm.  The patient sustained an untreated MI approximately 6 weeks ago.  She was traveling to [facility] with her son when she experienced syncope and was taken to the hospital and found to be in VT.  She underwent urgent catheterization which demonstrated a proximal RCA occlusion and an akinetic diaphragmatic wall with pseudoaneursym.  She underwent aneursysmectomy and CABG.  Post op she had an ICD placed for recurrent VT.  She is on amiodarone and has had no recurrent symptoms.  She denies c/p, sob or peripheral edema and she has stopped smoking.  She was seen by Dr. Katrinka Blazing and placed on lasix.  Current Medications (verified): 1)  Simvastatin 20 Mg Tabs (Simvastatin) .... Take One Tablet By Mouth Daily At Bedtime 2)  Amiodarone Hcl 200 Mg Tabs (Amiodarone Hcl) .... Take 2 Tablets By Mouth Twice A Day 3)  Alprazolam 0.5 Mg Tabs (Alprazolam) .... As Needed 4)  Carvedilol 3.125 Mg Tabs (Carvedilol) .... Take One Tablet By Mouth Twice A Day 5)  Famotidine 20 Mg Tabs (Famotidine) .... Once Daily 6)  Oxycodone Hcl 5 Mg Tabs (Oxycodone Hcl) .... As Needed 7)  Aspirin 81 Mg Tbec (Aspirin) .... Take One Tablet By Mouth Daily 8)  Diclofenac Sodium 75 Mg Tbec (Diclofenac Sodium) .... Take One Tablet By Mouth Twice Daily.  Past History:  Past Medical History: Last updated: 11/07/2009 Current Problems:  VENTRICULAR TACHYCARDIA (ICD-427.1) UNSPECIFIED SYSTOLIC HEART FAILURE (ICD-428.20) HEART VALVE REPLACED BY OTHER MEANS (ICD-V43.3) VENTRICULAR FIBRILLATION (ICD-427.41) CAD (ICD-414.00)    Past Surgical History: Last updated: 11/07/2009 Implantation of dual-chamber  St. Jude defibrillator  Family History: Last updated: 11/07/2009  Positive for vascular disease in her  father.  Social History: Last updated: 11/07/2009 Full Time Single  Tobacco Use - Yes.   Review of Systems       All systems reviewed and negativ except as noted in the HPI.  Vital Signs:  Patient profile:   50 year old female Weight:      187 pounds Pulse rate:   82 / minute BP sitting:   100 / 70  (left arm)  Vitals Entered By: Laurance Flatten CMA (Nov 08, 2009 12:10 PM)  Physical Exam  General:  Well developed, well nourished, in no acute distress.  HEENT: normal Neck: supple. No JVD. Carotids 2+ bilaterally no bruits Cor: RRR no rubs, gallops or murmur Lungs: CTA. Well healed ICD incision. Ab: soft, nontender. nondistended. No HSM. Good bowel sounds Ext: warm. no cyanosis, clubbing or edema Neuro: alert and oriented. Grossly nonfocal. affect pleasant    MD Comments:  Normal device function.  ATP therapies and shock therapies were reprogrammed.  Impression & Recommendations:  Problem # 1:  VENTRICULAR TACHYCARDIA (ICD-427.1) Her symptoms are well controlled.  I have asked her to continue her current meds and will recheck in several months. Her updated medication list for this problem includes:    Amiodarone Hcl 200 Mg Tabs (Amiodarone hcl) .Marland Kitchen... Take 2 tablets by mouth twice a day    Carvedilol 3.125 Mg Tabs (Carvedilol) .Marland Kitchen... Take one tablet by mouth twice a day    Aspirin 81 Mg Tbec (Aspirin) .Marland Kitchen... Take one tablet by mouth daily  Problem # 2:  UNSPECIFIED SYSTOLIC HEART FAILURE (ICD-428.20) She appears  to be well compensated.  A low sodium diet is recommended. Her updated medication list for this problem includes:    Amiodarone Hcl 200 Mg Tabs (Amiodarone hcl) .Marland Kitchen... Take 2 tablets by mouth twice a day    Carvedilol 3.125 Mg Tabs (Carvedilol) .Marland Kitchen... Take one tablet by mouth twice a day    Aspirin 81 Mg Tbec (Aspirin) .Marland Kitchen... Take one tablet by mouth daily    Furosemide 20 Mg Tabs (Furosemide) .Marland Kitchen... Take one tablet by mouth daily.  Problem # 3:  AUTOMATIC IMPLANTABLE  CARDIAC DEFIBRILLATOR SITU (ICD-V45.02) Her device is working normally.  Her ICD therapies were reprogrammed.  We will recheck in several months.  Patient Instructions: 1)  Your physician recommends that you schedule a follow-up appointment in:  3 months with dr. Ladona Ridgel

## 2010-07-26 NOTE — [place] Notes (Signed)
  [employer] Request recieved sent to [facility]  March 21, 2010 3:29 PM

## 2010-07-26 NOTE — Cardiovascular Report (Signed)
Summary: Office Visit   Office Visit   Imported By: Roderic Ovens 02/22/2010 15:54:53  _____________________________________________________________________  External Attachment:    Type:   Image     Comment:   External Document

## 2010-08-31 ENCOUNTER — Encounter: Payer: Self-pay | Admitting: Internal Medicine

## 2010-09-05 ENCOUNTER — Encounter: Payer: Self-pay | Admitting: Internal Medicine

## 2010-09-07 ENCOUNTER — Encounter (INDEPENDENT_AMBULATORY_CARE_PROVIDER_SITE_OTHER): Payer: Self-pay | Admitting: *Deleted

## 2010-09-11 NOTE — Letter (Signed)
Summary: Appointment - Reminder 2  [facility], Main Office  [address]   [place], [place] [address]   Phone: [phone]  Fax: [phone]     September 07, 2010 MRN: [id number]   [name] [address] [place], [place]  [address]   Dear Ms. Bordwell,  Our records indicate that it is time to schedule a follow-up appointment.  Dr.Taylor  recommended that you follow up with [place] in  April. It is very important that we reach you to schedule this appointment. We look forward to participating in your health care needs. Please contact us at the number listed above at your earliest convenience to schedule your appointment.  If you are unable to make an appointment at this time, give [place] a call so we can update our records.     Sincerely,   Glass blower/designer

## 2010-09-19 ENCOUNTER — Encounter: Payer: Self-pay | Admitting: Internal Medicine

## 2010-09-19 ENCOUNTER — Ambulatory Visit (INDEPENDENT_AMBULATORY_CARE_PROVIDER_SITE_OTHER): Payer: BC Managed Care – PPO | Admitting: Internal Medicine

## 2010-09-19 DIAGNOSIS — I472 Ventricular tachycardia, unspecified: Secondary | ICD-10-CM

## 2010-09-19 DIAGNOSIS — I2510 Atherosclerotic heart disease of native coronary artery without angina pectoris: Secondary | ICD-10-CM

## 2010-09-19 DIAGNOSIS — Z95810 Presence of automatic (implantable) cardiac defibrillator: Secondary | ICD-10-CM

## 2010-09-19 NOTE — Assessment & Plan Note (Signed)
She denies anginal symptoms. Continue current meds and regular walking.

## 2010-09-19 NOTE — Patient Instructions (Signed)
Your physician wants you to follow-up in: 12 months.  You will receive a reminder letter in the mail two months in advance. If you don't receive a letter, please call our office to schedule the follow-up appointment.  Your physician recommends that you continue on your current medications as directed. Please refer to the Current Medication list given to you today.   

## 2010-09-19 NOTE — Assessment & Plan Note (Signed)
Her device is working normally except for an elevated atrial pacing threshold. We have reprogrammed her device today to allow for normal function.

## 2010-09-19 NOTE — [place] Notes (Signed)
HPI  Mrs. Reimers returns today for followup. She is a pleasant 50 yo woman with an ICM, VT, s/p ICD implant and dyslipidemia. The patient has done well since I last saw her. She is walking regularly and denies c/p or sob. No intercurrent ICD shocks. She has gone down to 200 mg a day of amiodarone and has tolerated this nicely.  Allergies  Allergen Reactions  . Prochlorperazine Edisylate      Current Outpatient Prescriptions  Medication Sig Dispense Refill  . amiodarone (PACERONE) 200 MG tablet Take 200 mg by mouth daily.        Marland Kitchen aspirin 325 MG EC tablet Take 325 mg by mouth daily.        . carvedilol (COREG) 6.25 MG tablet Take 6.25 mg by mouth 2 (two) times daily with a meal.        . Cholecalciferol (VITAMIN D) 1000 UNITS capsule Take 1,000 Units by mouth 2 (two) times daily.        . diclofenac (VOLTAREN) 75 MG EC tablet Take 75 mg by mouth 2 (two) times daily.        Marland Kitchen EVENING PRIMROSE OIL PO Take by mouth 2 (two) times daily.        Marland Kitchen KRILL OIL 1000 MG CAPS Take 2 capsules by mouth daily.        . Multiple Vitamin (MULTIVITAMIN) tablet Take 1 tablet by mouth daily.        . simvastatin (ZOCOR) 40 MG tablet Take 40 mg by mouth at bedtime.        Marland Kitchen spironolactone (ALDACTONE) 25 MG tablet Take 25 mg by mouth 2 (two) times daily.        Marland Kitchen DISCONTD: carvedilol (COREG) 3.125 MG tablet Take 3.125 mg by mouth 2 (two) times daily with a meal.        . DISCONTD: escitalopram (LEXAPRO) 10 MG tablet Take 10 mg by mouth daily.        Marland Kitchen DISCONTD: ALPRAZolam (XANAX) 0.5 MG tablet Take 0.5 mg by mouth at bedtime as needed.        Marland Kitchen DISCONTD: furosemide (LASIX) 40 MG tablet Take 40 mg by mouth daily.        Marland Kitchen DISCONTD: potassium chloride (KLOR-CON) 20 MEQ packet Take 20 mEq by mouth daily.           Past Medical History  Diagnosis Date  . Arrhythmia     ventricular tachycardia  . Unspecified systolic heart failure   . Heart valve replaced by other means   . Ventricular fibrillation   .  Coronary artery disease     ROS:   All systems reviewed and negative except as noted in the HPI.   Past Surgical History  Procedure Date  . Insert / replace / remove pacemaker     DUAL-CHAMBER St. JUDE DEFIBRILLATOR     No family history on file.   History   Social History  . Marital Status: Divorced    Spouse Name: N/A    Number of Children: N/A  . Years of Education: N/A   Occupational History  .      WORKS FULL TIME   Social History Main Topics  . Smoking status: Former Games developer  . Smokeless tobacco: Not on file  . Alcohol Use:   . Drug Use:   . Sexually Active:    Other Topics Concern  . Not on file   Social History Narrative   WORKS FULL TIMESINGLETOBACCO USE-YESIMPLANTATION OF  DUAL-CHAMBER St. JUDE DEFIBRILLATOR     BP 130/80  Pulse 60  Ht 5\' 6"  (1.676 m)  Wt 219 lb (99.338 kg)  BMI 35.35 kg/m2  Physical Exam:  Obese, middle aged well appearing woman NAD HEENT: Unremarkable Neck:  No JVD, no thyromegally Lymphatics:  No adenopathy Back:  No CVA tenderness Lungs:  Clear. Well healed ICD incision. HEART:  Regular rate rhythm, no murmurs, no rubs, no clicks Abd:  Flat, positive bowel sounds, no organomegally, no rebound, no guarding Ext:  2 plus pulses, no edema, no cyanosis, no clubbing Skin:  No rashes no nodules Neuro:  CN II through XII intact, motor grossly intact  DEVICE  Normal device function except for an elevated atrial pacing threshold.  See PaceArt for details.   Assess/Plan:

## 2010-09-19 NOTE — Assessment & Plan Note (Signed)
She has had no recurrent ventricular arrhythmias. Will continue low dose amiodarone.

## 2010-11-06 NOTE — Op Note (Signed)
[name], Ritter NO.:  [id number]   MEDICAL RECORD NO.:  [id number]         PATIENT TYPE:  HAMB   LOCATION:                               FACILITY:  [facility]   PHYSICIAN:  Jamison Neighbor, M.D.  DATE OF BIRTH:  02/07/1961   DATE OF PROCEDURE:  01/26/2008  DATE OF DISCHARGE:                               OPERATIVE REPORT   PREOPERATIVE DIAGNOSES:  1. Gross hematuria with recent history of transitional cell carcinoma.  2. Bladder pain.   POSTOPERATIVE DIAGNOSES:  1. Gross hematuria with recent history of transitional cell carcinoma.  2. Bladder pain.   PROCEDURES:  1. Cystoscopy.  2. Bladder biopsy times five.  3. Fulguration.   ANESTHESIA:  General.   COMPLICATIONS:  None.   DRAINS:  None.   BRIEF HISTORY:  This 50 year old female underwent TURBT and subsequently  underwent BCG therapy.  At first, the patient seemed to have good  control of her lower urinary tract, but towards the end of the planned  BCG therapy, she began to develop hematuria and problems with  significant pain.  She describes pain within the urethra that has been  difficult to control.  She also has very poorly controlled frequency and  urgency.  It has been felt that this is most likely due to some BCG  irritation and conservative therapy has been attempted using Ditropan  and Pyridium Plus, as well as antibiotics where indicated.  The patient  feels that she is not really improving.  She is concerned about the  gross hematuria.  She is now to undergo cystoscopy and bladder biopsies  to make sure that there is no residual bladder cancer left.  She will  also have the biopsies evaluated to see if there is evidence of  granulomatous disease, in which case a trial of anti-BCG therapy may be  appropriate.  Full informed consent was obtained.   PROCEDURE:  After successful induction of general anesthesia, the  patient was placed in the dorsal position, prepped with Betadine, and  draped in the usual sterile fashion.  Careful bimanual examination  revealed an unremarkable urethra with no signs of mass of any kind.  There were no signs of a diverticulum.  The urethra was of normal  caliber, accepting a 30-French female urethral sound, with no signs of  stenosis or stricture.  The cystoscope was inserted.  The bladder had a  very inflamed appearance.  There was no evidence of any clear-cut  transitional cell carcinoma, but there were reddened areas near the  areas of previous resection.  It is unclear whether any of this  represents carcinoma in situ or granulomatous cystitis, but several  biopsies were taken with the cold cup biopsy forceps.  These were taken  from the right side, left side, trigone, midline, and one on the right  bladder neck closest to the area of resection.  All of the sites were  then cauterized with a loop cauterization using a resectoscope.  Additional areas that were markedly reddened were fulgurated and the  urine looked quite a bit clearer by the end of  procedure.  The biopsies  will be sent to rule out carcinoma in situ, rule out occult cancer, and  also to make sure that there is no evidence of granulomatous cystitis.   The patient will be sent home on antibiotic therapy, as well as pain  medication.  She will be continued on her Pyridium Plus and Ditropan.  If there is evidence of granulomatous disease, she will be given a  course of INH.  Otherwise, if there is no cancer, she will simply be  given empiric therapy to help with her burning, frequency, pain, and  outlet symptoms.  Consideration will be given to muscle relaxant therapy  and we will at this point also give her some benzodiazepine for relaxing  her pelvic floor and helping her to urinate a little bit easier.      Jamison Neighbor, M.D.  Electronically Signed     RJE/MEDQ  D:  01/26/2008  T:  01/26/2008  Job:  [id number]

## 2010-11-06 NOTE — Consult Note (Signed)
NAMEKENZLY, ROGOFF NO.:  [id number]   MEDICAL RECORD NO.:  [id number]          PATIENT TYPE:  OUT   LOCATION:  GYN                          FACILITY:  [facility]   PHYSICIAN:  Paola A. 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 At that time, she underwent BSO  and partial omentectomy. After lengthy discussion of options that were  discussed with her, she underwent chemotherapy with BEP-based  chemotherapy which she completed in May 2007 under the care of Dr.  Darrold Span and Dr. Dalene Carrow.  She had a post-treatment MRI in May 2007 that  revealed a 2-3 cm mass on top of the vaginal cuff that had not been  detected previously and felt to be consistent with old scar or hematoma.  She was last seen by me December 2007 at which time her exam was  unremarkable.  In addition, she was seen by Dr. Dalene Carrow in March 2008 at  which time she had MRI of the abdomen and pelvis that revealed no  evidence of recurrent disease.  She also had an inhibin of less than 1  and a CA-125 of 10.2.  She comes in today for followup and is overall  doing fairly well.  She continues to her muscle and joint pains.  She  was diagnosed with a low vitamin D level.  She has been under the care  of Dr. Phylliss Bob, of rheumatology. She was placed on vitamin D replacement.  Her vitamin D levels did rise. She had a slight improvement in her  symptoms,  but they still persist. She denies any abdominal pain.  She  is very disappointed with her continued weight gain.  She states that  she has cut out junk food and is trying to drink more water and is  walking about 1/2 mile two times a week, and she has not been able to  lose weight.  She states that before she was menopausal, it was much  easier for her to  lose weight than it has been recently. She complains  of occasional abdominal distension when she wakes up in the morning.  There is no nausea, vomiting, and no early satiety.  The abdominal  distention does not seem to change with p.o. intake or bowel movements.   PHYSICAL EXAMINATION:  VITAL SIGNS:  Weight 192 pounds, blood pressure  110/84.  GENERAL:  Well-nourished, well-developed female in no acute distress.  NECK:  Supple.  There is no lymphadenopathy, no thyromegaly.  CHEST:  Anterior chest wall shows a well-healed surgical incision.  LUNGS:  Clear to auscultation bilaterally.  CARDIOVASCULAR:  Regular rate and rhythm.  ABDOMEN:  Shows well-healed surgical incisions.  Abdomen is soft,  nontender, nondistended.  No palpable masses or hepatosplenomegaly.  Groins are negative for adenopathy.  EXTREMITIES:  No edema.  PELVIC:  Bimanual examination reveals no masses or  nodularity.  Rectal  confirms.   ASSESSMENT:  This is a 49 year old with a technically unstaged, at least  stage IC granulosa cell tumor with clinically no evidence of recurrent  disease.   PLAN:  Will check a CA-125 and inhibin today.  The patient is already  scheduled to see Dr. Dalene Carrow in 3 months, and she will keep that  appointment.  After that time, I think she can go to evey-69-month visits  as we can decrease the frequency of her followup.  She should continue  following up with Dr. Phylliss Bob as scheduled.      Paola A. Duard Brady, MD  Electronically Signed     PAG/MEDQ  D:  12/09/2006  T:  12/09/2006  Job:  [id number]   cc:   Tammy R. Collins Scotland, M.D.  Fax: [phone]   Alfonse Alpers. Dagoberto Ligas, M.D.  Fax: [phone]   Lauretta I. Odogwu, M.D.  Fax: [phone]   Guy Sandifer. Henderson Cloud, M.D.  Fax: [phone]   Telford Nab, R.N.  [address]  [place], [place] [address]   Ines Bloomer, M.D.  [address]  [place]  [place] [address]   Lennis P. Darrold Span, M.D.  Fax: [phone]   W. Danella Maiers, M.D.  Fax: [phone]

## 2010-11-06 NOTE — Consult Note (Signed)
[name], [name] NO.:  [id number]   MEDICAL RECORD NO.:  [id number]          PATIENT TYPE:  OUT   LOCATION:  GYN                          FACILITY:  [facility]   PHYSICIAN:  Stacy Schimke, MD     DATE OF BIRTH:  Jun 29, 1960   DATE OF CONSULTATION:  DATE OF DISCHARGE:                                 CONSULTATION   REASON FOR VISIT:  Followup for granulosa cell tumor.   HISTORY OF PRESENT ILLNESS:  This is a 50 year old with stage 1C  granulosa cell tumor of the ovary who underwent a BSO and omentectomy in  February of 2007.  She has received 3 cycles of BEP based chemotherapy,  completed in May of 2007.  Postoperatively mass was noted at the top of  the vaginal cuff.  The patient in 2009 reported hematuria and was  subsequently seen by Dr. Logan Ritter in Urology and was diagnosed with  transition cell carcinoma of the bladder which was resected.  At this  time she is without any complaints.   PAST MEDICAL HISTORY:  No changes.   PAST SURGICAL HISTORY:  No interval changes.   MEDICATIONS:  Xanax and unknown medication for joint pains.   SCREENING HISTORY:  Mammogram in August of 2009.   REVIEW OF SYSTEMS:  A 10-point review of systems unremarkable.   PHYSICAL EXAMINATION:  Weight 202 pounds, blood pressure 132/78.  Well-  developed female in no acute distress.  ABDOMEN:  Soft, nontender, no palpable masses, no fluid waves,  laparoscopy incision is healed.  CHEST:  Clear to auscultation.  LYMPH NODES:  No cervical, supraclavicular or inguinal adenopathy.  PELVIC:  Normal external genitalia, Bartholin, urethral, and Skene's.  No masses noted on or within the vagina.  No nodularity.  RECTAL:  Good anal sphincter tone without any rectovaginal masses or  nodularity.   IMPRESSION:  Stage 1C ovarian granulosa cell tumor.  [name] remains  without any evidence of disease.   PLAN:  To have her follow up in 6 months.  No tumor markers have been  ordered.      Stacy Schimke, MD  Electronically Signed     WB/MEDQ  D:  12/01/2008  T:  12/01/2008  Job:  [id number]   cc:   [name], M.D.  Fax: [phone]   [name], R.N.  [address]  [place], [place] [address]   [name]

## 2010-11-06 NOTE — Consult Note (Signed)
[name], TIPPING NO.:  [id number]   MEDICAL RECORD NO.:  [id number]          PATIENT TYPE:  OUT   LOCATION:  GYN                          FACILITY:  [facility]   PHYSICIAN:  Paola A. Duard Brady, MD    DATE OF BIRTH:  1960-07-02   DATE OF CONSULTATION:  DATE OF DISCHARGE:                                 CONSULTATION   HISTORY OF PRESENT ILLNESS:  [name] is a 13, soon to be 50 year old  with a history of stage IC on stage granulosa cell tumor diagnosed in  February 2007.  At that time, she had undergone BSO and partial  omentectomy.  After lengthy discussion of her options, including staging  follow-up chemotherapy, the patient opted for her chemotherapy with the  capital BEP which she completed in May 2007 under the care of Dr.  Darrold Span and Odogwu.  She had a post-treatment MRI in May 2007 which  revealed a 2-3 cm mass at the top of the vaginal cuff that had not been  detected previously.  MRI in March 2008 revealed no evidence of  recurrent disease.  I last saw her in June 2008 at which time her exam  was unremarkable.  She had an inhibin A of less than one and a CA-125 of  10.1.  She comes in today for follow-up.  She is overall doing fairly  well.  She continues to have large hematuria and has been evaluated by  Dr. Collins Scotland.  A urine micro was negative for malignancy.  There was  transitional and squamous cells with no evidence of malignancy but she  is being referred to urologist.  She has otherwise been doing fairly  well.  She has some arthritis and hip-type pains and is currently on  Arthrotec.  She continues to have some weight gain, having gained about  5 pounds since we last saw her.  She denies any early satiety.  She does  complain of abdominal bloating, but she states more weight gain rather  than bloating.  She denies any nausea, vomiting, fevers, chills,  headaches or visual changes.  The abdominal distention when she wakes up  in the morning is no  longer there.   PHYSICAL EXAMINATION:  VITAL SIGNS:  Weight 197 pounds, blood pressure  108/78.  GENERAL:  Well-nourished, well-developed female in no acute distress.  NECK:  Supple with no lymphadenopathy, no thyromegaly.  LUNGS:  Clear to auscultation bilaterally.  CARDIOVASCULAR:  Regular rate and rhythm.  ABDOMEN:  Shows well-healed surgical incision including a low transverse  skin incision.  There is no evidence of any incisional hernias.  Abdomen  is soft, nontender, nondistended.  No palpable masses or positive  megaly.  Groins are negative for adenopathy.  EXTREMITIES:  There is no edema.  PELVIC:  External genitalia is within normal limits.  Bimanual  examination reveals no masses or nodularity.  Rectal confirms.   ASSESSMENT:  A 50, soon to be 50 year old with clinically unstaged but  at least stage IC granulosa cell tumor who has been free of disease for  2 years.  The patient inquired  what type of imaging we would be  performing.  At this point, I do not want to plan on any as she is going  to be referred to a urologist and will most likely proceed with imaging  at that time.  From my perspective, CT or MRI, either one, would be  adequate, but will defer to the urologic evaluation.  Plan to follow up  on the results of her total inhibin (inhibin A and inhibin B as well as  her CA-125).   In order for her to try to save money, she would like to limit her  visits to [place], and she will return to see [place] if all is well in 6 months.  She was given a card to call us as a reminder for her 51-month  appointment.  She was also given an additional card to have the  urologist have our information so we can have a copy of her evaluation  and note.      Paola A. Duard Brady, MD  Electronically Signed     PAG/MEDQ  D:  08/25/2007  T:  08/26/2007  Job:  [id number]   cc:   Tammy R. Collins Scotland, M.D.  Fax: [phone]   Alfonse Alpers. Dagoberto Ligas, M.D.  Fax: [phone]   Lauretta I. Odogwu, M.D.  Fax:  [phone]   Guy Sandifer. Henderson Cloud, M.D.  Fax: [phone]   Telford Nab, R.N.  [address]  [place], [place] [address]   Lennis P. Darrold Span, M.D.  Fax: [phone]

## 2010-11-06 NOTE — Op Note (Signed)
Stacy, Ritter NO.:  [id number]   MEDICAL RECORD NO.:  [id number]          PATIENT TYPE:  AMB   LOCATION:  [facility]                         FACILITY:  [facility]   PHYSICIAN:  Jamison Neighbor, M.D.  DATE OF BIRTH:  1961-05-28   DATE OF PROCEDURE:  09/22/2007  DATE OF DISCHARGE:                               OPERATIVE REPORT   SERVICE:  Urology.   PREOPERATIVE DIAGNOSES:  Transitional cell carcinoma.   POSTOPERATIVE DIAGNOSES:  Transitional cell carcinoma.   PROCEDURE:  Cystoscopy, bilateral retrogrades with interpretation,  transurethral resection of bladder tumor, Mitomycin C installation.   SURGEON:  Dr. Marcelyn Bruins.   ANESTHESIA:  General.   COMPLICATIONS:  None.   DRAINS:  None.   BRIEF HISTORY:  This patient was undergoing evaluation for gross  hematuria and was found to have what appeared to be superficial bladder  tumors.  The larger of these was lateral to the left ureter.  There was  a small satellite type lesion next to it and there was one tiny area  closer to the midline.  The remainder of the bladder appeared  unremarkable.  The patient was advised that these were most likely  transitional cell tumors and that she should undergo retrograde studies  to evaluate her upper tracts as well as TURBT. She understands that she  should stop smoking in order to try to prevent further tumor formation  and she also will require intravesical chemotherapy postoperatively.  She understands the risks and benefits of the procedure and gave full  informed consent.   PROCEDURE:  After successful induction of general anesthesia, the  patient was placed in the dorsal lithotomy position, prepped with  Betadine and draped in the usual sterile fashion.  Careful bimanual  examination revealed no palpable masses.  There was minimal cystocele,  there was no rectocele or enterocele. There was no vault prolapse, the  urethra was unremarkable.  The urethra was of  normal caliber with no  sign of stenosis.  The cystoscope was inserted.  The bladder was  carefully inspected. As noted in the previous evaluation, the patient  had two lesions just lateral to the left orifice, both of these appeared  to be very superficial.  There also was one tiny area towards the  midline that needed to be fulgurated but it was a little too small to  actually resect. There was one reddened area that also was felt to  require fulguration although this was minimally suspicious and may  simply have been some scope trauma.  The bilateral retrogrades were  performed.  The right ureteral orifice was cannulated with a 6-French  catheter. Retrograde studies showed normal ureter with no evidence of  obstruction, there were no filling defects seen.  The collecting system  was finely formed with no hydronephrosis. Drain out films were normal.  On the left-hand side, there was no involvement of the ureter from the  two tumors that were close by. The ureter was very normal in its  appearance with a normal caliber.  The collecting system was  unobstructed, there were no filling  defects or stones seen.  This was  also felt to be normal retrograde. Drain outs were carefully evaluated  including the distal ureter which was felt to be normal.  Following  completion of the retrogrades, the resectoscope was inserted.  The area  towards the midline was fulgurated without difficulty.  The reddened  area slightly to the right of midline was fulgurated without difficulty.  The small tumor was resected followed by resection of the large tumor.  This was taken down all way to the base and the base was carefully  fulgurated.  There was no evidence of deep invasion and this appear to  be a very superficial tumor.  Careful inspection of the bladder then  showed no evidence whatsoever of any other tumors. All the chips were  irrigated from the bladder and sent to pathology. A latex-free catheter   was inserted and the patient received mitomycin. The patient tolerated  this without difficulty and was taken to the recovery in good condition.  She will be sent home with Lorcet Plus, Pyridium plus, doxycycline as  well as a prescription for Chantix to try and help her stop smoking.      Jamison Neighbor, M.D.  Electronically Signed     RJE/MEDQ  D:  09/22/2007  T:  09/22/2007  Job:  [id number]

## 2010-11-06 NOTE — Consult Note (Signed)
[name], VANNOSTRAND NO.:  [id number]   MEDICAL RECORD NO.:  [id number]          PATIENT TYPE:  OUT   LOCATION:  GYN                          FACILITY:  [facility]   PHYSICIAN:  Paola A. Duard Brady, MD    DATE OF BIRTH:  15-Oct-1960   DATE OF CONSULTATION:  02/23/2008  DATE OF DISCHARGE:                                 CONSULTATION   HISTORY:  [name] is a 50 year old with history of stage IC granulosa  cell tumor diagnosed in February 2007.  At that time, she underwent BSO  and omentectomy.  After lengthy discussion of her options including  staging versus chemotherapy, she opted for chemotherapy and underwent 3  cycles of BAP based chemotherapy which she completed in May 2007 under  the care of Dr. Darrold Span and Dr. Dalene Carrow.  Post-treatment MRI revealed a 2-  3 cm mass at the top of the vaginal cuff that had not been detected.  Followup in March 2008 was negative.  I last saw her in March 2009 at  which time her exam was unremarkable.  Her inhibin was less than 1.  At  that time, she had been complaining of some hematuria.  She has  subsequently been seen by Dr. Logan Bores in urology.  The patient was  diagnosed with transitional cell carcinoma of the bladder on the left-  hand side which was resected.  It was recommended that she have close  followup.  She is overall doing quite well.  She feels very blessed to  have had 2 early cancers and is doing well from that standpoint.  She is  currently using some Pyridium and some oxybutynin for some bladder  spasms.   REVIEW OF SYSTEMS:  She denies any chest pain, shortness of breath,  nausea, vomiting, fevers, chills, headaches, visual changes.  She denies  any significant change in her bowel habits.  She does have the bowel  issues as stated above.  She continues to have discolored urine from the  Pyridium.  She does have some arthritis pains in her hip and has been  complaining of some increasing edema and swelling in her knees  particularly this weekend.  She has had previously some right knee  swelling, but now it is involving both knees which is new to her.  She  has had a rheumatologic workup in the past and has been placed on  vitamin D for a vitamin D deficiency.  She has not been discussing this  with Dr. Yehuda Budd yet.   PHYSICAL EXAMINATION:  VITAL SIGNS:  Weight 197 pounds which is up 5  pounds from her last visit which is stable, blood pressure 117/80, pulse  112.  GENERAL:  Well-nourished, well-developed female in no acute distress.  NECK:  Supple.  There is no lymphadenopathy, no thyromegaly.  LUNGS:  Clear to auscultation bilaterally.  CARDIOVASCULAR:  Regular rate and rhythm.  ABDOMEN:  Shows a well-healed transverse skin incision.  Abdomen is  soft, nontender, nondistended.  There are no palpable masses or  hepatosplenomegaly.  Groins are negative for adenopathy.  EXTREMITIES:  She does have  some edema overlying her kneecap on her  right leg.  There is a small amount on the left leg, but the right is  worse than the left.  There is no peripheral edema.  She has 2+ distal  pulses.  PELVIC:  External genitalia is within normal limits.  Bimanual  examination reveals no masses or nodularity.  Rectal confirms.   ASSESSMENT:  50 year old with stage IC granulosa cell tumor and an  early stage transitional cell carcinoma of the bladder.   PLAN:  1. We will check an inhibin today.  She will return to see me in 6      months.  2. She is due for a mammogram and we will get that scheduled.  3. She states that she will call Dr. Yehuda Budd to get set up and      scheduled.  She will call and speak to Dr. Yehuda Budd regarding the      lower extremity edema.  4. She has follow up with her urologist in November for repeat      evaluation and cystoscopy.      Paola A. Duard Brady, MD  Electronically Signed     PAG/MEDQ  D:  02/23/2008  T:  02/23/2008  Job:  [id number]   cc:   Vicente Serene I. Odogwu, M.D.  Fax:  [phone]   Tammy R. Collins Scotland, M.D.  Fax: [phone]   Alfonse Alpers. Dagoberto Ligas, M.D.  Fax: [phone]   Guy Sandifer. Henderson Cloud, M.D.  Fax: [phone]   Telford Nab, R.N.  [address]  [place], [place] [address]   Lennis P. Darrold Span, M.D.  Fax: [phone]   Jamison Neighbor, M.D.  Fax: [phone]

## 2010-11-09 NOTE — Discharge Summary (Signed)
NAMESMT., LODER NO.:  [id number]   MEDICAL RECORD NO.:  [id number]          PATIENT TYPE:  INP   LOCATION:  2007                         FACILITY:  [facility]   PHYSICIAN:  Corky Crafts, MDDATE OF BIRTH:  11/25/1960   DATE OF ADMISSION:  05/22/2006  DATE OF DISCHARGE:  05/23/2006                               DISCHARGE SUMMARY   ADMISSION DIAGNOSIS:  Unstableangina.   DISCHARGE DIAGNOSIS:  1. Probable non-cardiac chest pain.  s/p negative cardiolite stress      test.  2. Tobacco abuse with recommended smoking cessation.  3. History of ovarian cancer with chemotherapy.  4. History of hyperthyroidism and hypothyroidism after chemotherapy      treatment.  5. Enlarged thymus gland.  6. Insomnia.  7. Status post left foot cyst removal at age [age].  8. Status post tonsillectomy.  9. Status post appendectomy.  10.Status post hysterectomy.  11.Status post bilateral oophorectomy.  12.Status post pump bump removal.  13.Status post left femur head cyst removal.  14.Status post tubal ligation in 1993.  15.Family history of coronary artery disease in father and mitral      valve prolapse in her mother.   PROCEDURES:  Adenosine Cardiolite on 05/23/2006 without evidence of  ischemia, ejection fraction of 60%.   HOSPITAL COURSE:  [name] is a 50 year old Caucasian female without a  known history of coronary artery disease.  She was admitted to the [facility] on 05/22/2006 with acute coronary syndrome that was  consistent with unstable angina.  A 12-lead EKG revealed normal sinus  rhythm at 74 beats per minute.  There were no acute ST or T wave  changes.  Point of care enzymes were negative x3 with a peak troponin of  less than 0.05.  Serial cardiac enzymes were negative x4 with a peak  troponin of 0.01.  The patient was started on IV nitroglycerin and IV  heparin.   The following day, the patient underwent an adenosine Cardiolite that  was  negative for ischemia with an EF of 60%.  IV nitroglycerin and  heparin drips were stopped after the adenosine Cardiolite.  The patient  was discharged to home in stable condition on 05/23/2006 without further  complaints of chest pain, shortness of breath, diaphoresis, nausea,  vomiting, or dizziness.  She was instructed to continue her home medical  regimen with the exception of adding aspirin 81 mg daily.   A smoking cessation consul was called during this admission secondary to  tobacco abuse.   LABORATORY DATA:  Point of care enzymes:  Myoglobin 38.4, 39.2, and 48.2  respectively.  CK/MB 1.2, 1.1, and less than 1.0 respectively.  Troponin  I less than 0.05 x3.  Serial cardiac enzymes CK total 64, 62, 45, and 56  respectively.  CK/MB 1.3, 1.0, 0.9, and 0.9 respectively.  Troponin I  less than 0.01 and 0.01 respectively.  Heparin level less than 0.10  white blood count 8.7, hemoglobin 13.8, hematocrit 39.7, platelet  164,000.  Sodium 137, potassium 3.6, chloride 107 CO2 20, glucose 75,  BUN 7, creatinine 0.6.  Uncorrected calcium  9.0, magnesium 2.1, PT 13.1,  INR 1.0, PTT 32, D-dimer 0.23 magnesium 2.1.   X-RAYS:  Chest x-ray, single view, on 05/22/2006 no evidence of acute  cardiopulmonary disease.   EKG:  As stated in the hospital course.   CONDITION UPON DISCHARGE:  [name] was discharged to home in stable  condition on 05/23/2006 without further complaints of angina, shortness  of breath, diaphoresis, nausea, vomiting, or dizziness.   DISCHARGE MEDICATIONS:  1. Aspirin 81 mg daily.  This represented a new addition to her home      medical regimen.  2. Neurontin 300 mg 3x daily.  3. Synthroid 100 mcg daily.  4. Temazepam 15 mg at bedtime.  5. Percocet 5/500 mg 1-2 tablets as needed for pain   DISCHARGE INSTRUCTIONS:  Please stop smoking; call [phone] to enroll in  a smoking cessation class.   FOLLOWUP ARRANGEMENTS:  Follow up with primary care physician in  reference  to noncardiac chest pain.      [address] [place], [place]      Corky Crafts, MD  Electronically Signed    RDM/MEDQ  D:  07/11/2006  T:  07/11/2006  Job:  [id number]   cc:   Tammy R. Collins [place], M.D.

## 2010-11-09 NOTE — Op Note (Signed)
[facility]  Patient:    [name], [name]                     MRN: [id number] Proc. Date: 06/09/00 Adm. Date:  [id number] Attending:  Cordelia Pen Ii                           Operative Report  PREOPERATIVE DIAGNOSES:       1. Menorrhagia.                               2. Pelvic pain.  POSTOPERATIVE DIAGNOSES:      1. Menorrhagia.                               2. Endometriosis.  OPERATION:                    Laparoscopically assisted vaginal hysterectomy                               and ablation of endometriosis.  SURGEON:                      Guy Sandifer. Arleta Creek, M.D.  ASSISTANT:                    Beather Arbour. Thomasena Edis, M.D.  ANESTHESIA:                   General anesthesia with endotracheal intubation                               per Gretta Cool., M.D.  ESTIMATED BLOOD LOSS:         250 cc.  INDICATIONS AND CONSENT:      This patient is a 50 year old separated white female, G3, P2, AB1, status post tubal ligation with increasing symptoms of heavy vaginal bleeding as well as pelvic pain.  Details are dictated in the history and physical.  Laparoscopically assisted vaginal hysterectomy and removal of ovary only if distinctly abnormal was discussed.  Possible risks and complications were discussed including, but not limited to, infection, bowel, bladder or ureteral damage, bleeding requiring transfusion of transfusion of blood products with possible transfusion reaction, HIV and hepatitis acquisition, DVT, PE, pneumonia, fistula formation and laparotomy. All questions were answered and consent is signed on the chart.  FINDINGS:                     The upper abdomen looks normal.  Appendix has been removed.  The uterus is approximately six weeks in size, smooth in contour.  The anterior cul-de-sac is normal.  Posterior cul-de-sac contains at least two areas of white puckered peritoneum consistent with endometriosis. Pelvic  sidewalls are normal.  Tubes are status post ligation with clips bilaterally.  Right ovary is totally normal.  Left ovary contains a 2 cm smooth translucent type cyst.  DESCRIPTION OF PROCEDURE:     The patient is taken to the operating room and placed in the dorsal supine position where general anesthesia is induced via endotracheal intubation.  She is prepped abdominally and vaginally.  Bladder is straight catheterized and  she is draped in a sterile fashion.  Hulka tenaculum has been placed in the uterus as a manipulator.  Small infraumbilical incision is made and a 12 mm disposable trocar sleeve is placed on the first attempt without difficulty.  PLacement is verified with the laparoscope and no damage to the surrounding structures is noted.  The pneumoperitoneum is induced and a small suprapubic and later left lower quadrant incisions are made after careful transillumination and 5 mm nondisposable trocar sleeves are placed under direct visualization without difficulty.  The above findings are noted.  The implants and posterior cul-de-sac are ablated. Then using the 5 mm laparoscopic to the left lower trocar sleeve, the stapler with the disposable white cartridge is used to take down the proximal ligaments using two bites on the left and one bite on the right to get just below the level of the round ligament bilaterally. Switching back to the operative scope, good hemostasis is noted.  The vesicouterine peritoneum is incised in the midline, hydrodissected and dissected bilaterally.  Then instruments are removed and attention is turned to the vagina.  Posterior cul-de-sac is entered sharply without difficulty. Cervix is circumscribed with the scalpel and the vaginal mucosa is advanced sharply and bluntly.  The uterosacral ligaments are then taken bilaterally and are ligated with 0 Monocryl suture.  All suture will be 0 Monocryl unless otherwise designated.  Bladder pillars are then taken  bilaterally.  Anterior cul-de-sac is entered without difficulty.  The uterine vessels followed by two bites above this level are taken bilaterally.  Fundus is delivered posteriorly.  Proximal ligaments are clamped and cut and the specimen is delivered. The left pedicle is ligated with free tie.  The right pedicle is ligated with free tie then a suture.  Good hemostasis is noted.  The uterosacral ligaments are then plicated to the vagina bilaterally.  They are then plicated in the midline with single suture.  Cuff is then closed with figure-of-eight sutures.  Foley catheter is placed in the bladder and clear urine is noted.  Attention is returned to the abdomen and careful inspection reveals minor oozing from peritoneal edges which is controlled with Bipolar cautery.  Excessive fluid is removed.  Inferior trocar sleeves are removed and the peritoneum is reduced and no bleeding is noted from any site.  The pneumoperitoneum is completely reduced and the umbilical trocar sleeve is removed.  The umbilical incision is closed with a 0 Vicryl suture in the deeper underlying layers with care being taken not to pick up any underlying structures.  Skin incisions are then all injected with 0.5% plain Marcaine and are then closed with DermaBond.  All counts are correct. The patient is awakened and taken to the recovery room in stable condition. DD:  06/09/00 TD:  06/09/00 Job: [id number]

## 2010-11-09 NOTE — H&P (Signed)
[name], [name]              ACCOUNT NO.:  [id number]   MEDICAL RECORD NO.:  [id number]          PATIENT TYPE:  INP   LOCATION:  0103                         FACILITY:  [facility]   PHYSICIAN:  Lennis P. Darrold Span, M.D.DATE OF BIRTH:  April 05, 1961   DATE OF ADMISSION:  10/20/2005  DATE OF DISCHARGE:                                HISTORY & PHYSICAL   The patient is a 50 year old white female being treated adjuvantly by Dr.  Dalene Carrow for granulosa cell tumor of the ovary, having had her third cycle of  BEP chemotherapy begun October 17, 2005, and given in hospital at [facility].  She was discharged home on April 27 but has had increased nausea and  vomiting since then, unable to keep down any p.o.'s at all today.  She has  been using Compazine suppositories which she had not used previously and has  been extremely restless which suggests reaction to the COMPAZINE.  She has  had no fever, no problems with the Port-A-Cath.   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  She has had 3 of a  planned 3 to 4 cycles of BET chemotherapy with cisplatin and etoposide given  in hospital with each course.  She did not have nausea and vomiting to this  extent with the first 2 chemotherapy treatments.  She has required G-CSF for  neutropenia, and she has been on Cipro prophylaxis.  She is scheduled for  next chemotherapy with bleomycin at our office on May 1.   REVIEW OF SYSTEMS:  As above.   PAST MEDICAL HISTORY:  She is not know to have any drug allergies, but  likely is intolerant to COMPAZINE  as above.   Hysterectomy in 2001 for dysfunctional bleeding.  Post appendectomy and  tonsillectomy.  Gravida 3.   FAMILY HISTORY:  Noncontributory.   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   PHYSICAL EXAMINATION:  GENERAL:  Well-developed, well-nourished lady who  appears her stated age, looks moderately uncomfortable lying on her side on  the stretcher with IV infusing via the Port-A-Cath.  VITAL SIGNS: Temperature 97.6, heart rate 92 and regular, blood pressure  133/95, respirations unlabored at 18 on room air.  HEENT:  Alopecia.  Pupils equal and reactive.  She is no icteric.  Her mouth  is quite dry and without lesions.  Port-A-Cath site is not remarkable.  LUNGS: Clear.  HEART:  Regular rate and rhythm.  ABDOMEN:  Soft, quiet, nontender.  EXTREMITIES:  Lower extremities without edema or cords.  NEUROLOGIC:  Nonfocal just on the bed exam.   LABORATORY DATA:  Sodium 131, potassium 3.4, chloride 94, CO2 25, glucose  114, BUN 10, creatinine 0.8.  Last magnesium was 2 on April 6. CBC from  April 27: White count 7.4, hemoglobin 10.1, platelets 397.   KUB and urinalysis pending.   IMPRESSION AND PLAN:  1.  Nausea, vomiting, dehydration following third cycle of  BEP chemotherapy.      Admit, IV fluids, IV antiemetics.  Hold COMPAZINE.  Follow up her labs.      Hopefully she will improve just within the next 24 to 48 hours and be      able to be discharged home again.  2.  Granulosa cell tumor of the ovary post surgery as above and now      receiving adjuvant chemotherapy.  3.  History of tobacco abuse.  4.  Anemia, likely related to the chemotherapy.      Lennis P. Darrold Span, M.D.  Electronically Signed     LPL/MEDQ  D:  10/20/2005  T:  10/20/2005  Job:  [id number]   cc:   Vicente Serene I. Odogwu, M.D.  Fax: [phone]   Guy Sandifer. Henderson Cloud, M.D.  Fax: [phone]   Paola A. Duard Brady, MD  [address]  [place], [place] [address]   Telford Nab, R.N.  [address]  [place], [place] [address]

## 2010-11-09 NOTE — Discharge Summary (Signed)
[name], [name]              ACCOUNT NO.:  [id number]   MEDICAL RECORD NO.:  [id number]          PATIENT TYPE:  INP   LOCATION:  1320                         FACILITY:  [facility]   PHYSICIAN:  Lauretta I. Odogwu, M.D.DATE OF BIRTH:  02-Mar-1961   DATE OF ADMISSION:  10/14/2005  DATE OF DISCHARGE:  10/18/2005                                 DISCHARGE SUMMARY   DISCHARGE DIAGNOSES:  1.  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  2.  Status post cycle #3 of cisplatin, etoposide and bleomycin chemotherapy.   DISCHARGE MEDICATIONS:  1.  Ciprofloxacin 500 mg p.o. b.i.d. x5 days.  2.  Continue all other home medications including antiemetics.   PROCEDURES:  Chemotherapy.  Patient dosed with cisplatin at 20 mg/sq. m for  a total dose of 37.8 mg on day #1-5, etoposide 100 mg/sq, m for a total of  189 mg IV on day #1-5, and bleomycin 30 units given on day #2 of therapy.   CONDITION ON DISCHARGE:  Stable.   FOLLOW-UP:  The patient has follow-up appointments that have been previously  scheduled.  She is scheduled for bleomycin on Oct 22, 2005.   HISTORY OF PRESENT ILLNESS:  The patient is a 50 year old female diagnosed  with right granulosa cell tumor of the ovary.  She is status post laparotomy  and bilateral salpingo-oophorectomy on July 31, 2005.  Staging CT scans  and MRI demonstrated no evidence of metastatic disease, and the patient's  baseline pulmonary function tests are normal.   HOSPITAL COURSE:  The patient tolerated her third cycle of chemotherapy well  with only nausea and vomiting x3 reported on October 17, 2005, and diarrhea  which was controlled with her use of Imodium.  Otherwise, her chemotherapy  was uneventful.   EXAMINATION ON DISCHARGE:  VITAL SIGNS:  Temperature is 97.8, T-max over 24  hours is 98.5, pulse is 58, respirations 20, blood pressure is 125/77,  oxygen saturation is 97% on room air.  HEENT:  Alopecia.  Mucous membranes are moist without lesion and  thrush.  LUNGS:  Clear to auscultation without wheezes, rales or rhonchi.  CARDIAC:  Mild bradycardia with normal S1, S2.  ABDOMEN:  Soft, nontender, nondistended, with positive bowel sounds.  EXTREMITIES:  No cyanosis, clubbing or edema.   LABORATORY DATA:  WBC is 7.4, hemoglobin is 10.1, hematocrit is 29.5,  platelets are 397.  Sodium 139, potassium 3.9, chloride is 104, CO2 27, BUN  is 10, creatinine is 0.8, glucose is 81, total bilirubin is 0.8, alkaline  phosphatase 59, SGOT is 16, SGPT is 14, total protein is 5.2, albumin is  2.8.   PATIENT DISCHARGE INSTRUCTIONS:  She is instructed to call for temperatures  over 100.5 and to push fluids.      [name], PA      Lauretta I. Odogwu, M.D.  Electronically Signed    VET/MEDQ  D:  10/18/2005  T:  10/18/2005  Job:  [id number]

## 2010-11-09 NOTE — H&P (Signed)
NAMECINTHYA, BORS NO.:  [id number]   MEDICAL RECORD NO.:  [id number]           PATIENT TYPE:   LOCATION:                               FACILITY:  [facility]   PHYSICIAN:  Lauretta I. Odogwu, M.D.DATE OF BIRTH:  1960/06/26   DATE OF ADMISSION:  10/14/2005  DATE OF DISCHARGE:                                HISTORY & PHYSICAL   IDENTIFYING INFORMATION:  Ms. Warr is a pleasant 50 year old female who was  admitted to [facility] on October 14, 2005, for cycle 3 of  chemotherapy for a dysgerminoma of the ovary (adult type).   HISTORY OF PRESENT ILLNESS:  Ms. Blumenthal is a pleasant 50 year old female with  a history of a granulosa cell tumor of the ovary.  A CT scan of the abdomen  and pelvis completed on July 29, 2005, showed a 9.5 x 6 cm mass in the  right pelvis with free fluid in the cul-de-sac.  A pelvic ultrasound dated  July 29, 2005, demonstrated a mixed solid and cystic mass on the right  adnexa measuring 6.2 x 9.8 x 8.1 cm.  The patient underwent a laparotomy  with bilateral salpingo-oophorectomy.  Peritoneal biopsies were taken and a  partial omentectomy was performed.  She was noted to have hemorrhagic  peritoneal fluid.  Pathology demonstrated a 12 cm adult granulosa cell tumor  of the right ovary.  A chest CT scan dated August 28, 2005, showed no  metastatic disease to the chest but an enhancing lesion within the portion  of the liver, which was visualized, was noted.  An MRI of the abdomen was  completed on August 24, 2005, showing numerous hypervascular lesions  consistent with hemangiomas.  She was staged as a 1C. Pulmonary function  tests were normal.  A port was placed and she was admitted to [facility] on August 30, 2005, for cycle 1 of her Cisplatin, bleomycin and  etoposide chemotherapy.  Since that time, she has received her second cycle  of chemotherapy on admission dated September 23, 2005.  She is admitted to [facility] on  October 14, 2005, for cycle 3 of her bleomycin, etoposide and  Cisplatin chemotherapy.   PAST MEDICAL HISTORY:  1.  Status post partial hysterectomy in 2001 for dysfunctional uterine      bleeding.  2.  Status post appendectomy.  3.  Status post tonsillectomy.  4.  Status post excision of a left hip cyst.   SOCIAL HISTORY:  The patient is divorced with 3 children, she lives in the  [place] area, she works as a Diplomatic Services operational officer for [employer], sport and exercise, she has a 30-  pack year history of smoking, she denies alcohol use.   FAMILY HISTORY:  An uncle with prostate cancer.   HEALTH MAINTENANCE:  Her mammograms are up to date.   MEDICATIONS ON ADMISSION:  1.  Pepcid 20 mg p.o. b.i.d. p.r.n. reflux.  2.  Vicodin 5/500 1-2 p.o. q.6h. p.r.n. for pain.  3.  Ambien 10 mg p.o. q.h.s. p.r.n. for insomnia.  4.  Tranxene T-tabs 7 mg p.o. b.i.d.  p.r.n. for anxiety.  5.  Vitamin E 400 international units p.o. once daily.  6.  Iron 325 mg p.o. b.i.d.  7.  Xanax 0.25 mg p.o. every 8 hours prn anxiety.   ALLERGIES:  NO KNOWN ALLERGIES.   REVIEW OF SYSTEMS:  The patient is doing well without complaints, she is  afebrile, she denies mucositis or diarrhea.   PHYSICAL EXAM:  GENERAL:  The patient is an adult Caucasian female who  appears to be in no acute distress.  VITAL SIGNS:  Weight is 178, height 68 inches, body surface area is 1.89 m2,  pulse is 116, blood pressure is 108/75, temp is 98.3, respirations 20.  HEENT EXAM:  Shows alopecia with no ulcerations and no mucositis.  LUNGS:  Clear to auscultation and percussion bilaterally without wheezes,  rales or rhonchi.  Port-a-cath without erythema.  CARDIAC EXAM:  Reveals mild tachycardia with normal S1, S2.  ABDOMEN:  Is soft, nontender, nondistended with positive bowel sounds.  No  hepatosplenomegaly, masses, rebound or guarding is noted.  Port is present  without erythema or exudate.  EXTREMITIES:  Show no clubbing, cyanosis or edema.   LABORATORY  DATA:  WBC is 31.2, hemoglobin 11.6, hematocrit 34.7, platelets  are 315.  Comprehensive metabolic panel is pending.   ASSESSMENT AND PLAN:  1.  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  She is admitted to [facility] on October 14, 2005, for cycle 3 of her Cisplatin (D1-5),      bleomycin (D2 only) and etoposide (D1-5) chemotherapy.   Code status is a Full Code.   Patient seen and examined; Plan of care reviewed by Dr. Dalene Carrow.      [name], PA      Lauretta I. Odogwu, M.D.  Electronically Signed    VET/MEDQ  D:  10/11/2005  T:  10/11/2005  Job:  [id number]

## 2010-11-09 NOTE — Discharge Summary (Signed)
[name], [name]              ACCOUNT NO.:  [id number]   MEDICAL RECORD NO.:  [id number]          PATIENT TYPE:  INP   LOCATION:  1301                         FACILITY:  [facility]   PHYSICIAN:  Lauretta I. Odogwu, M.D.DATE OF BIRTH:  1961-03-20   DATE OF ADMISSION:  09/23/2005  DATE OF DISCHARGE:  09/27/2005                                 DISCHARGE SUMMARY   DISCHARGE DIAGNOSES:  1.  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  2.  Status post cycle 2 of cisplatin, etoposide, and bleomycin chemotherapy.   DISCHARGE MEDICATIONS:  1.  Ciprofloxacin 500 mg p.o. b.i.d. for seven days.  2.  Zofran 8 mg p.o. twice daily for five days and p.r.n. following      chemotherapy.  3.  Decadron 8 mg twice daily for four to five days following chemotherapy.  4.  Pepcid 20 mg p.o. b.i.d. p.r.n. for reflux.  5.  Vitamin E 400 international units daily.  6.  Continue with other home medications.   PROCEDURE:  Chemotherapy.  Patient dosed with cisplatin 20 mg/sq .m for a  total of 37.8 mg IV on days 1-5, etoposide dosed at 100 mg/sq .m for a total  of 189 mg IV on days 1-5, and bleomycin 30 units given on day 2 of therapy.   CONDITION ON DISCHARGE:  Stable.   FOLLOW UP:  The patient is to followup on October 01, 2005 for bleomycin and  October 04, 2005 to see Dr. Dalene Carrow with labs.   HISTORY OF PRESENT ILLNESS:  The patient is a 50 year old woman diagnosed  with right granulosa cell tumor of the ovary.  She is status post laparotomy  and bilateral salpingo-oophorectomy on July 31, 2005.  Staging CT's and  MRI demonstrated no evidence of metastatic disease.  The patient's baseline  pulmonary function test was normal.   HOSPITAL COURSE:  Besides heart burn controlled with antiemetics, the  patient tolerated chemotherapy well.   PHYSICAL EXAMINATION ON DISCHARGE:  VITAL SIGNS:  Pulse 55, blood pressure  134/88, temperature 98.5, respirations 20.  HEENT:  Mouth with thrush and ulcerations.  CHEST:  Unremarkable.  CARDIOVASCULAR:  Unremarkable.  Port-a-cath wihout erythema or discharge.  ABDOMEN:  Soft, nontender.  No hepatosplenomegaly.  Bowel sounds present.  EXTREMITIES:  No edema. Pulses present and symmetrical.   LABORATORY DATA:  White blood cell count 7.6, hemoglobin 11.3, hematocrit  30.4, platelets 279.  Sodium 139, potassium 4, chloride 105, CO2 27, BUN 10,  creatinine 0.8, glucose 80.   DISCHARGE INSTRUCTIONS:  1.  The patient is to periodically monitor her temperature and to call if      temperature is greater than 100.5.  2) Improve on fluid intake.      Lauretta I. Odogwu, M.D.  Electronically Signed     LIO/MEDQ  D:  09/27/2005  T:  09/27/2005  Job:  [id number]

## 2010-11-09 NOTE — H&P (Signed)
NAMEQUANIYA, DAMAS NO.:  [id number]   MEDICAL RECORD NO.:  [id number]          PATIENT TYPE:  OUT   LOCATION:  XRAY                         FACILITY:  [facility]   PHYSICIAN:  Lauretta I. Odogwu, M.D.DATE OF BIRTH:  1960/06/29   DATE OF ADMISSION:  08/30/2005  DATE OF DISCHARGE:  08/30/2005                                HISTORY & PHYSICAL   IDENTIFYING INFORMATION:  Mrs. Komorowski is pleasant 50 year old Caucasian female  who is admitted to [facility] on September 02, 2005 for chemotherapy  for a granulosa cell tumor of the ovary.   HISTORY OF PRESENT ILLNESS:  Mrs. Baldini is a pleasant 50 year old Caucasian  female with a history of a granulosa cell tumor of the ovary. The patient  noted intermittent mild pelvic pain with stomach cramps dating to January  2007.  CT scan of the abdomen and pelvis con July 29, 2005 showed a 9.5 x  6 cm mass in the right pelvis with free fluid in the cul-de-sac.  Pelvic  ultrasound of July 29, 2005 demonstrated a mixed solid and cystic mass  in the right adnexa measuring 6.2 x 9.8 x 8.1 cm.  The patient underwent a  laparotomy with bilateral salpingo-oophorectomy.  Peritoneal biopsies were  taken and a partial omentectomy was performed.  The patient was noted to  have a hemorrhagic peritoneal fluid. 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 The patient was staged as a 1-C  granulosa cell tumor.  A chest CT scan on August 28, 2005 with results showing  no metastatic disease  of the chest with enhancing lesions within the  portion of the liver which was visualized.  An MRI of the abdomen completed  on August 24, 2005 showed numerous hypervascular lesions consistent with  hemangiomas not demonstrated on ultrasound.  Pulmonary function tests were  normal. The pateint has had a port placed.   PAST MEDICAL HISTORY:  1.  Status post partial hysterectomy in 2001 for dysfunctional uterine  bleeding.  2.  Status post appendectomy.  3.  Status post tonsillectomy.  4.  Status post excision of a left hip cyst.   SOCIAL HISTORY:  The patient is divorced with three children.  She [place] in  the [place] area. 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 She has  a 30-pack-year history of smoking.  She denies alcohol use.   FAMILY HISTORY:  An uncle with prostate cancer.   HEALTH MAINTENANCE:  Mammograms are up to date.   ADMISSION MEDICATIONS:  1.  Percocet 5/325 mg one to two p.o. q.4h. p.r.n. for pain.  2.  Tylenol 650 mg p.o. q.4h. p.r.n. fever or pain.  3.  Sennokot prn.  4.  Ambien 5 mg qhs.   ALLERGIES:  No known drug allergies.   REVIEW OF SYSTEMS:  Positive for hot flashes with night sweats. She denies fever, chills,  headaches, constipation, nausea, vomiting, weakness, and fatigue.   ADMISSION PHYSICAL:  VITAL SIGNS:  Pulse 76, blood pressure 109/78,  Temperature 97.3, Respirations 18.  HEENT:  Atruamtic, Normocephalic.  Sclera - anicteric. Mouth - no  ulcerations/lesions.  LYMPH NODES:  No lymphadenopathy is appreciated in the cervical,  supraclavicular, axillary, epitrochlear, or inguinal areas.  LUNGS:  Clear to auscultation without wheezes, rales or rhonchi.  There is a  port in the right chest without erythema or exudate.  HEART:  Regular rate and rhythm without murmurs, rubs or gallops.  ABDOMEN:  Well-healed surgical incision at the umbilicus.  There is diffuse  tenderness of the abdomen with no organomegaly and with positive bowel  sounds.  EXTREMITIES:  No cyanosis, clubbing or edema.  CNS and PNS: No focal deficit.   LABORATORY DATA:  Pending.   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  She is admitted for chemotherapy consisting of  Cisplatin (d1-5), Bleomycin (d2 only), and etoposide (d1-5). The pateint  will continue with other home medicines with oncology standing orders.   The  patient is a full code.   The pateint seen and examined with Dr. Dalene Carrow.      [name], PA      Lauretta I. Odogwu, M.D.  Electronically Signed    VET/MEDQ  D:  09/01/2005  T:  09/02/2005  Job:  [id number]

## 2010-11-09 NOTE — Discharge Summary (Signed)
[name], WYNDER NO.:  [id number]   MEDICAL RECORD NO.:  [id number]          PATIENT TYPE:  INP   LOCATION:  3312                         FACILITY:  [facility]   PHYSICIAN:  Ines Bloomer, M.D. DATE OF BIRTH:  [birth year]   DATE OF ADMISSION:  06/19/2006  DATE OF DISCHARGE:                               DISCHARGE SUMMARY   PRIMARY ADMITTING DIAGNOSIS:  Thymic mass.   ADDITIONAL/DISCHARGE DIAGNOSES:  1. Thymic hyperplasia.  2. History of ovarian cancer status post surgical resection and      chemotherapy.  3. History of arthritis.  4. History of cystic hepatic lesions followed by routine MRIs.  5. History of stable thyroid nodules.  6. History of tobacco abuse.   PROCEDURES PERFORMED:  Partial median sternotomy with a thymectomy and  insertion of right chest tube.   HISTORY OF PRESENT ILLNESS:  The patient is a 50 year old female with a  history of ovarian cancer who was recently referred to Dr. Edwyna Shell for  evaluation of a large thymic mass. She underwent a recent CT scan which  showed a large mediastinal mass which had supposedly increased in size  and was felt to be thymic hyperplasia. She has no history of weight  loss, myasthenia gravis or other related symptoms.  After review of her  films Dr. Edwyna Shell felt that she should undergo a sternotomy __________  thymectomy in order to obtain a diagnosis. He explained the risks,  benefits, and alternatives of the procedure to the patient, and she  agreed to proceed with surgery.   HOSPITAL COURSE:  She was admitted to [facility] on June 19, 2006 and was taken to the operating room where she underwent a  partial median sternotomy with thymectomy by Dr. Edwyna Shell. She tolerated  the procedure well and was transferred to the floor in stable condition.  Postoperatively she has done well. Her intraoperative frozen section as  well as her final pathology were positive for thymic hyperplasia, and no  evidence of malignancy was found. Her diet has been progressed as  tolerated. She has slowly been able to mobilize, and at the time of  discharge is ambulating without problem. She has been treated with  aggressive pulmonary toilet measures and has been weaned from  supplemental oxygen. She has remained afebrile, and all vital signs have  been stable.  It is anticipated that her chest tube will be removed on  June 21, 2006. She will have repeat labs at that time as well as a  chest x-ray. Hopefully if she has continued to remain stable she will be  ready for discharge home within the next 48 hours.   CURRENT LABORATORIES:  Hemoglobin 12.6, hematocrit 36.4, white count  9.5, platelets 138. Sodium 138, potassium 3.8, BUN 2, creatinine 0.5.   Her incisions are healing well. Her JP drain has been removed.   DISCHARGE MEDICATIONS:  1. Tylox 1-2 q.4 h. p.r.n. for pain.  2. Synthroid 0.1 mg daily.  3. Temazepam 15 mg q.h.s.  4. Gabapentin 300 mg q.h.s.  5. Premarin 0.3 mg q.h.s.   DISCHARGE INSTRUCTIONS:  She is asked to refrain from driving, heavy  lifting or strenuous activity. She may continue ambulating daily and  using her incentive spirometer. She may shower daily and clean her  incisions with soap and water. She will continue her same preoperative  diet.   DISCHARGE FOLLOWUP:  She will see Dr. Edwyna Shell back in the office on  January 8 with a chest x-ray from [facility]. She is asked  to contact our office in the interim if she experiences problems or has  questions.      [name], P.A.    ______________________________  Ines Bloomer, M.D.    GC/MEDQ  D:  06/20/2006  T:  06/20/2006  Job:  [id number]   cc:   [name], M.D.

## 2010-11-09 NOTE — Op Note (Signed)
[name], [name] NO.:  [id number]   MEDICAL RECORD NO.:  [id number]          PATIENT TYPE:  OUT   LOCATION:  GYN                          FACILITY:  [facility]   PHYSICIAN:  Paola A. Duard Brady, MD    DATE OF BIRTH:  Dec 09, 1960   DATE OF PROCEDURE:  08/13/2005  DATE OF DISCHARGE:                                 OPERATIVE REPORT   REFERRING PHYSICIAN:  Guy Sandifer. Henderson Cloud, M.D.   The patient is seen in consultation at the request of Dr. Henderson Cloud.   [name] is a 50 year old, gravida 3, para 3 who in mid January began having  some pelvic pain and stomach cramping which eased off and then it repeated  again. She subsequently was seen in the emergency room and underwent a CT  scan of the abdomen and pelvis. Within the CT scan it showed a 9.5 x  6 cm  mass in the right pelvis with free fluid in the cul-de-sac. A differential  diagnosis was inflammatory ovarian mass versus an ovarian neoplasm versus a  hemorrhagic process. The uterus was surgically absent. The left ovary could  not be identified. There were no other changes noted. She subsequently  underwent a pelvic ultrasound that showed the left ovary to be normal in  appearance measuring 2.6 x  3.3 x 1.7 cm. Within the right adnexa, she had a  mixed solid cystic mass measuring 6.2 x 9.8 x 0.1 cm with some hyperemia  around the mass with free fluid in the cul-de-sac with debris. On July 31, 2005, she underwent diagnostic laparoscopy. At that time she had about  100 mL of clot in the pelvis, the upper abdomen appeared normal. There were  no adhesions or studding, the left ovary appeared normal. They attempted to  remove the mass however, with manipulation the capsule would be shredded.  Therefore they proceeded with laparotomy via a Pfannenstiel skin incision.  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  There  were five to six 2-3 mm implants on the cecum that were felt to be  most likely inflammatory. Final pathology reveals within the right ovary and  fallopian tube, a adult granulosa cell tumor measuring 12 cm. The cecal  biopsy was negative, the left ovary was unremarkable and the omentum was  negative. Washings were not sent at the time of surgery. She comes in for  evaluation of the above.   Since the time of surgery, she is overall doing well. She is starting to  feel better and better. She states she is eating fine, she denies any change  of bowel or bladder habits. She has suffered from some dyschezia and pelvic  cramps. She had those prior to surgery and they are essentially unchanged.  She does complain of some bladder pressure and pain which is no different.  She denies any vaginal pain.   PAST SURGICAL HISTORY:  Significant for hysterectomy in 2001 for  dysfunctional uterine bleeding. She had an appendectomy, tonsillectomy, hip  and foot cyst.  She has had two spontaneous vaginal deliveries.   ORAL PAIN MEDICATIONS:  Ibuprofen and iron.   ALLERGIES:  None.   SOCIAL HISTORY:  She is divorced, she smokes 1 to 1-1/2 packs of cigarettes  per day. She has done for 20+ years. She denies the use of alcohol. 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   FAMILY HISTORY:  She denies any significant family history of GYN cancer.  She has a maternal uncle who had oral cancer. He was a smoker. There is a  history of hypertension and diabetes in the family. She does have two boys,  ages 5 and 45.   HEALTH MAINTENANCE:  She has never had a mammogram.   PHYSICAL EXAMINATION:  VITAL SIGNS:  Weight 179 pounds, height 5 feet 5.5,  pulse 80, respirations 18.  GENERAL:  Well nourished, well-developed female in no acute distress.  NECK:  Supple with no lymphadenopathy, no thyromegaly.  LUNGS:  Clear to auscultation bilaterally.  CARDIOVASCULAR:  Regular rate and rhythm.  ABDOMEN:  She has  well healed laparoscopy skin incisions. She has a well  healed Pfannenstiel's skin incision. Abdomen is soft, nontender,  nondistended. There are no palpable masses or hepatosplenomegaly. Groins are  negative for adenopathy.  PELVIC:  Bimanual examination is somewhat limited secondary to abdominal  tenderness and voluntary guarding but there are no distinct masses palpable.  Rectovaginal confirms no nodularity.   ASSESSMENT:  A 50 year old with at least a stage IC granulosa cell tumor. I  discussed with her that with IC disease you would often consider  chemotherapy consisting of BEP chemotherapy. However, we do not have any  significant documentation that she is a stage IC as washings were not  obtained and it is unclear if the mass was actually ruptured or if it was  just bleeding. I will check a CA-125 and an inhibin level. I also discussed  with her that I will finalize the CT scan report to ensure that there is no  significant adenopathy noted. I will converse regarding her case with my  colleagues at [facility] and I will contact her on Thursday with  recommendations. Recommendations can include diagnostic laparoscopy with  appropriate staging to evaluate her true stage and then determined therapy  versus treating her empirically with chemotherapy. She can be reached at  home at [phone] or on her cell phone at [phone].      Paola A. Duard Brady, MD  Electronically Signed     PAG/MEDQ  D:  08/13/2005  T:  08/14/2005  Job:  [id number]   cc:   Guy Sandifer. Henderson Cloud, M.D.  Fax: [phone]   Telford Nab, R.N.  [address]  [place], [place] [address]

## 2010-11-09 NOTE — Consult Note (Signed)
[name], [name] NO.:  [id number]   MEDICAL RECORD NO.:  [id number]          PATIENT TYPE:  OUT   LOCATION:  GYN                          FACILITY:  [facility]   PHYSICIAN:  Paola A. Duard Brady, MD    DATE OF BIRTH:  1961/06/18   DATE OF CONSULTATION:  12/31/2005  DATE OF DISCHARGE:                                   CONSULTATION   [name] is a 50 year old who has at least stage I C granulosa cell tumor of  the ovary diagnosed in February 2007.  At that time,  she underwent BSO,  partial omentectomy.  She was dispositioned to three cycles of  BEP  chemotherapy which she completed Oct 31, 2005, under the care of Dr. Darrold Span  and Dr. Dalene Carrow.  After her last cycle, was given Compazine, and it is felt  that she may have had allergic reaction to the Compazine.  She did have a  post treatment MRI that shows 2-3 cm mass at the top of the vaginal cuff  that has not been detected prior as her other a imaging had been of the  abdomen and chest.  This lesion measured 2.9 x 2.2 cm which was superior and  posterior to the vaginal cuff.  It was felt that this ovoid structure was  most likely consistent with an old hematoma. She is otherwise doing quite  well.  She is complaining of fairly significant vasomotor symptoms. She was  taking oil of evening primrose tea with minimal benefit.  She is complaining  of significant feet and finger swelling. Her joints her sore, and she is  having hip pain.  She has been diagnosed with hyperthyroidism. The workup is  in progress, and she is having thyroid scans on Wednesday and Thursday this  week.   MEDICATIONS:  Rozerem p.r.n. and Xanax p.r.n.   PHYSICAL EXAMINATION:  VITAL SIGNS: Weight 183 pounds.  GENERAL:  Well-nourished, well-developed female in no acute distress.  NECK:  Neck is supple.  There is no lymphadenopathy, no thyromegaly.  LUNGS: Lungs were clear to auscultation bilaterally.  CARDIOVASCULAR:  Regular rate and rhythm.  ABDOMEN:  Shows well-healed transverse skin incision.  Abdomen is soft,  nontender, nondistended.  There are no palpable masses or  hepatosplenomegaly.  Groins are negative for adenopathy.  EXTREMITIES:  She has 1+ nonpitting edema equal bilaterally.  PELVIC:  External genitalia is within normal limits.  Vagina is atrophic.  The vaginal cuff is visualized.  There are no gross visible lesions.  Bimanual examination does reveal a fullness of the top of the vagina which  is minimally tender. Rectovaginal examination does not reveal the mass.  There is no associated nodularity.  I cannot state that there is a distinct  mass palpable.   ASSESSMENT:  53.  A 49 year old with a technically unstaged but at least stage IC      granulosa cell tumor who has completed three cycles of BEP-base      chemotherapy.  Post-treatment imaging does show a 2 x 2.5 cm mass at the      top of the  vagina.  I believe the likelihood of this being a malignant      process in the setting of no other disease and this being solely      adjuvant therapy as well; I believe this  likely represents      postoperative changes.  However, she has a followup MRI scheduled for      September, which I think is appropriate.  2.  The patient is suffering significantly from her vasomotor symptoms.  I      discussed with her hormone replacement therapy versus nonhormonal agent      such as clonidine and Effexor. She was very much like to proceed with      hormone replacement therapy.  She was given a prescription for Premarin      0.3 mg.  3.  She will continue following up with her medical oncologist, and once      this issue of a questionable vaginal cuff mass is evaluated, we can      alternate visits between medical oncology and ourselves.  She has      followup with Dr. Dalene Carrow in September, and we will be seeing her back in      December or p.r.n.      Paola A. Duard Brady, MD  Electronically Signed     PAG/MEDQ  D:   12/31/2005  T:  12/31/2005  Job:  [id number]   cc:   Guy Sandifer. Henderson Cloud, M.D.  Fax: [phone]   Telford Nab, R.N.  [address]  [place], [place] [address]   Vicente Serene I. Odogwu, M.D.  Fax: [phone]   Lennis P. Darrold Span, M.D.  Fax: [phone]

## 2010-11-09 NOTE — Consult Note (Signed)
NAMEALVERDA, NAZZARO NO.:  [id number]   MEDICAL RECORD NO.:  [id number]          PATIENT TYPE:  OUT   LOCATION:  GYN                          FACILITY:  [facility]   PHYSICIAN:  Paola A. Duard Brady, MD    DATE OF BIRTH:  [birth year]   DATE OF CONSULTATION:  DATE OF DISCHARGE:                                 CONSULTATION   Ms. Mordecai is a 50 year old who has at least a stage 1C granulosis cell  tumor of the ovary diagnosed in February of 2007. At that time she  underwent BSO and partial omentectomy. After a lengthy discussion the  patient opted for three cycles of BEP chemotherapy which she completed  in May of 2007 under the care of Dr. Darrold Span and Dr. Dalene Carrow. After her  last cycle she had a post treatment MRI that showed a 2-3 cm mass at the  top of the vaginal cuff that had not been detected prior.  It was felt  that it was an ovoid structure and could be consistent with an old  hematoma. She has recently been seen by Dr. Dalene Carrow in September of 2007.  At that time the mass anterior to the rectum was decreased in size,  measuring 1.8 x 1.3 cm which is down from 2.7 x 1.9 cm.  MRI of the  abdomen demonstrated multiple enhancing lesions in the liver which had  not changed and were felt to be benign cysts. The patient has also been  diagnosed with subacute thyroiditis and is being managed by Dr. Dagoberto Ligas  and is on thyroid supplementation. She states that she believes her  thyroid is now normal. She does complain of significant muscle aches and  joint pains which have been felt to be related to her thyroid issues.  She was told by one physician that it was related to her cisplatin. I  reassured her that cisplatin did not typically cause muscle and joint  aches in the absence of neuropathy, which she denies.  She states that  she wakes up in the morning stiff. She is better for part of the day and  then stiffness and discomfort increase. She states she has had a  rheumatologic  work up at least in part per her knowledge by Dr. Collins Scotland  and it has all been unremarkable. With regards to other issues, she is  scheduled to see Dr. Edwyna Shell of thoracic surgery as she has an enlarged  thymus and may need to consider surgical resection of her thymus.   She denies any chest pain, shortness of breath, nausea, vomiting,  fevers, chills, headaches or visual changes. She states her pain has  been about the same, independent of her thyroid. Occasionally her pain  is a 10 out of 10, if she takes a Percocet it will decreased to a 7-8  out of 10. She will take  Two pain pills about 2-3 o'clock during the day and again in the  evening. She is also taking Neurontin 600 mg q.h.s.   PHYSICAL EXAMINATION:  VITAL SIGNS: Weight 186 pounds which is up 3  pounds from her last  visit in July. Blood pressure 110/68.  GENERAL:  Well-nourished, well-developed female in no acute distress.  NECK: Supple, there is no lymphadenopathy, no thyromegaly.  LUNGS: Clear to auscultation bilaterally.  CARDIOVASCULAR EXAM: Regular rate and rhythm.  MUSCULOSKELETAL: Palpation of the shoulders, elbows, knees, does not  reproduce any tenderness, there are no palpable masses, there are no  effusions.  ABDOMEN: Shows a well-healed transverse skin incision. Abdomen is soft,  nontender, nondistended, there are no palpable masses or  hepatosplenomegaly. Groins were negative for adenopathy.  EXTREMITIES:  No edema.  PELVIC: Bimanual examination reveals no masses or nodularity.  RECTAL: Confirms.   IMPRESSION:  A 50 year old with history of at least stage 1C granulosis  cell tumor of the ovary, has no clinical evidence of recurrent disease.  The mass at the top of the vagina is most likely resulting hematoma and  it is smaller on successive imaging. She is scheduled for repeat imaging  in the spring of 2008 which will afford [place] another opportunity to  evaluate this.   PLAN:  1. She will return to see me in 6  months and will see Dr. Dalene Carrow in 3      months for lab testing as well as her imaging studies.  2. She will follow up with Dr. Edwyna Shell as scheduled.  3. She will continue follow up with Dr. Dagoberto Ligas. If it appears      reasonable to Dr. Dagoberto Ligas and Dr. Collins Scotland, it may be prudent to have      the patient be seen by a rheumatologist. Telford Nab will      assist with that referral. The patient is to call us this coming      Thursday for names of potential rheumatologists but she is to      discuss this with her primary physician as well.      Paola A. Duard Brady, MD  Electronically Signed     PAG/MEDQ  D:  06/03/2006  T:  06/03/2006  Job:  [id number]   cc:   Guy Sandifer. Henderson Cloud, M.D.  Fax: [phone]   Telford Nab, R.N.  [address]  [place], [place] [address]   Vicente Serene I. Odogwu, M.D.  Fax: [phone]   Lennis P. Darrold Span, M.D.  Fax: [phone]   Alfonse Alpers. Dagoberto Ligas, M.D.  Fax: [phone]   Tammy R. Collins Scotland, M.D.  Fax: [phone]   Ines Bloomer, M.D.  [address]  [place]  [place] [address]

## 2010-11-09 NOTE — Op Note (Signed)
[name], [name] NO.:  [id number]   MEDICAL RECORD NO.:  [id number]          PATIENT TYPE:  INP   LOCATION:  3312                         FACILITY:  [facility]   PHYSICIAN:  Ines Bloomer, M.D. DATE OF BIRTH:  [birth year]   DATE OF PROCEDURE:  06/19/2006  DATE OF DISCHARGE:                               OPERATIVE REPORT   PREOPERATIVE DIAGNOSIS:  Thymic enlargement, rule out thymoma, rule out  thymic hyperplasia, rule out lymphoma.   POSTOPERATIVE DIAGNOSIS:  Thymic hyperplasia.   OPERATION PERFORMED:  Partial median sternotomy with total thymectomy.   SURGEON:  Ines Bloomer, M.D.   FIRST ASSISTANT:  Coral Ceo, P.A.-C.   INDICATIONS FOR PROCEDURE:  This patient had a history of ovarian  cancer, as well as some cystic lesions on her liver, a small 4-mm nodule  on the right middle lobe, and was brought to the operating room with  progressive enlargement of the thymic gland.  This has been followed for  several months and had gotten progressively bigger.  She was brought for  removal of the thymus gland, rule out lymphoma or thymic hyperplasia.   DESCRIPTION OF PROCEDURE:  After percutaneous insertion of all  monitoring lines, the patient underwent general anesthesia.  After  general anesthesia and prepping and draping in the usual sterile manner,  a 10-cm incision was made from the sternal notch down towards the  xiphoid.  Subcutaneous tissue was divided with electrocautery.  The  sternum was then split with the sternal saw down to just below the angle  of Louis.  All bleeding was electrocoagulated.  Ostene was placed in the  bone marrow, and a small lamina spreader was placed.  Then, the  dissection was started superiorly, dissecting out 2 limbs of the  hypertrophic thymus.  The left was dissected down first, and then the  right dissected down, first staying close to the capsule.  All bleeding  was electrocoagulated or clipped with Ligaclips.   Then, the innominate  vein was identified and the superior branches of the vein were clipped  and divided.  Then, we went on the left side, dissecting it off the  pleura and off the pericardium.  The gland was markedly enlarged, being  at least 6 to 8 cm in length.  It was dissected off the pleura laterally  and down off the pericardium inferiorly, and then, starting on the right  side, it was dissected inferiorly up towards the innominate vein.  Several branches of the innominate vein to the thymus gland were clipped  and divided, and attention was carried more laterally, dissecting out  the large thymus gland until we were able to remove the thymus gland  completely.  We sent it for frozen section, and it was thymic  hyperplasia on the initial frozen section; and the rest of the gland, of  course, was sent for permanent section.  All bleeding was  electrocoagulated.  A #19 Blake drain was placed superiorly into the  wound.  All bleeding was electrocoagulated.  The chest was closed with  #5 wire in a twisted fashion, #2  Vicryl in the muscle layer, and 3-0  Vicryl in the subcutaneous tissue.  The left and right pleura had been  entered, and there appeared to be a small leak coming from the right  lung.  So, for this reason, after the dressing had been applied, the  area was then prepped laterally and a stab wound was made, and at the  fifth  intercostal space at the midaxillary line, a #20 chest tube was inserted  without difficulty and connected to a Pleur-evac.  It was sutured in  place with 0 silk.  A dry sterile dressing was applied.  The patient was  then returned to the recovery room in stable condition.           ______________________________  Ines Bloomer, M.D.     DPB/MEDQ  D:  06/19/2006  T:  06/19/2006  Job:  [id number]   cc:   Dorita Sciara, MD

## 2010-11-09 NOTE — H&P (Signed)
[name], [name] NO.:  [id number]   MEDICAL RECORD NO.:  [id number]          PATIENT TYPE:  INP   LOCATION:  2007                         FACILITY:  [facility]   PHYSICIAN:  Corky Crafts, MDDATE OF BIRTH:  [birth year]   DATE OF ADMISSION:  05/22/2006  DATE OF DISCHARGE:                              HISTORY & PHYSICAL   PRIMARY CARE PHYSICIAN:  Tammy R. Collins Scotland, M.D.   CARDIOLOGIST:  Corky Crafts, MD.   CHIEF COMPLAINTS:  Chest pressure.   HISTORY OF PRESENT ILLNESS:  [name] is a 50 year old Caucasian female  without a known history of coronary artery disease.  She presented to  the [facility] Emergency Department with a chief complaint of  nonexertional, midsternal chest pressure with a sudden onset at 10:00  p.m. last evening with a duration of 2 hours.  She states that it felt  like an elephant was sitting on her chest.  Chest pressure was  associated with shortness of breath, palpitations, dizziness and  occasional diaphoresis that she associated with menopause.  There was no  change in the chest pressure with inspiration, movement, or meals.  She  was brought to the [facility] Emergency Department by EMS and was given  aspirin 81 mg x4 plus sublingual nitroglycerin x1 with some relief.  A  12-lead EKG was obtained and revealed normal sinus rhythm at 74 beats  per minute without evidence of ischemia.  Point of care enzymes were  negative x3 with a peak troponin I of less than 0.05.  She denies a  previous history of chest pressure or stress test.  Chest x-ray did not  reveal any acute cardiopulmonary findings.  The patient is currently on  IV nitroglycerin and IV heparin and denies chest pain, shortness of  breath, nausea, vomiting, and dizziness.   PAST MEDICAL HISTORY:  1. History of ovarian cancer with chemotherapy treatment.  2. History of hyperthyroidism and hypothyroidism after chemotherapy      treatment.  3. Enlarged thymus  gland.  4. Insomnia.  5. Tobacco abuse.   ALLERGIES:  COMPAZINE.   CURRENT MEDICATIONS:  1. Oxycodone - acetaminophen (unknown dosage).  2. Gabapentin 2 tablets at bedtime (unknown dosage).  3. Temazepam at bedtime (unknown dosage).   PAST SURGICAL HISTORY:  1. Status post left foot cyst removal at age [age].  2. Status post tonsillectomy.  3. Status post appendectomy.  4. Status post hysterectomy.  5. Status post bilateral oophorectomy in February 2007.  6. Status post pump-bump removal.  7. Status post left femur head cyst removal.  8. Status post tubal ligation in 1993.   FAMILY HISTORY:  Father living age 74, CABG in 33.  Mother living with  a history of mitral valve prolapse.  Both sets of grandparents with  early coronary artery disease.   SOCIAL HISTORY:  Divorced with two sons.  Lives with sons.  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  Admits to tobacco use with a 40 pack  year history, now down to less than one pack per day.  She denies  alcohol or  illicit drug use.  She also denies a current exercise regimen  secondary to chronic muscle and joint pain.   REVIEW OF SYSTEMS:  Chronic muscle and joint pain.   PHYSICAL EXAMINATION:  GENERAL:  A 50 year old female, pleasant and  cooperative, NAD.  VITALS:  Temperature 97.1, blood pressure 109/61, pulse 80, respirations  18, O2 saturations 97% over room air.  HEENT: Unremarkable.  NECK:  Supple without JVD or carotid bruits, bilaterally.  Carotid  upstrokes 2+.  PULMONARY:  Breath sounds were equal and clear to auscultation,  bilaterally.  No use of accessory muscles.  CARDIOVASCULAR:  Regular rate and rhythm.  Normal S1 and S2 without  murmurs, gallops, clicks or rubs.  ABDOMEN:  Soft, nontender, nondistended with active bowel sounds.  No  masses, organomegaly, or bilateral bruits.  EXTREMITIES:  No peripheral  edema.  DP and PT pulses 2+/2, bilaterally.  NEUROLOGIC:  No focal motor or sensory deficits.   PSYCHIATRIC:  Normal mood and affect.  SKIN:  Warm and dry without rashes or lesions.  BACK:  No kyphosis or scoliosis.   LABORATORY DATA:  White blood count 8.3, hemoglobin 13.5, hematocrit  38.5, platelets 187,000.  Sodium 138, potassium 3.6, chloride 108, CO2  22.6, BUN 8, creatinine 0.9, glucose 118.  D-dimer 0.23. PT 13.1, INR 1,  PTT 32.  Myoglobin 38.4, 39.2, and 48.2, respectively; CK-MB 1.2, 1.1,  and less than 1, respectively; troponin I less than 0.05 x3.   CHEST X-RAY:  No acute cardiopulmonary disease.   EKG as stated in the HPI.   ASSESSMENT:  1. Acute coronary syndrome consistent with unstable angina.  2. Tobacco abuse.  3. Otherwise as stated in the past medical history.   PLAN:  1. Admit to cardiac telemetry unit under the service of Dr. Corky Crafts with a diagnosis of unstable angina.  2. Rule out myocardial infarction.  Point of care enzymes were      negative x3.  EKG without ischemia.  3. Cardiac panel q.8h. x3.  4. Continue IV nitroglycerin and IV heparin drip.  5. Initiate cardiology p.r.n. orders.  6. Adenosine Cardiolite in the a.m.  The patient is unable to walk on      the treadmill secondary to chronic muscle and joint pain.  7. NPO after midnight tonight on May 22, 2006.  8. Start coated aspirin 325 mg daily.  9. The tobacco cessation recommended.  Obtain smoking cessation      consult.  10.The patient was seen, interviewed and examined by Dr. Corky Crafts, who participated in the medical decision making and plan      of care.      [address] [place], [place]      Corky Crafts, MD  Electronically Signed    RDM/MEDQ  D:  05/22/2006  T:  05/23/2006  Job:  [id number]   cc:   Tammy R. Collins Scotland, M.D.

## 2010-11-09 NOTE — Discharge Summary (Signed)
[name], Stacy NO.:  [id number]   MEDICAL RECORD NO.:  [id number]          PATIENT TYPE:  INP   LOCATION:  9320                          FACILITY:  [facility]   PHYSICIAN:  Guy Sandifer. Henderson Cloud, M.D. DATE OF BIRTH:  [birth year]   DATE OF ADMISSION:  07/31/2005  DATE OF DISCHARGE:                                 DISCHARGE SUMMARY   ADMITTING DIAGNOSIS:  Right ovarian cyst.   DISCHARGE DIAGNOSIS:  Right ovarian cyst.   PROCEDURE:  On July 31, 2005, exploratory laparotomy with bilateral  salpingo-oophorectomy.   REASON FOR ADMISSION:  The patient is a 50 year old white female status post  LAVH and appendectomy with right lower quadrant pain. CAT scan and  ultrasound had been consistent with an 8-9 cm right adnexal cystic  structure. The patient is admitted for surgical management.   HOSPITAL COURSE:  The patient is taken to the operating room and undergoes  laparoscopy. This is converted to laparotomy with eventual bilateral  salpingo-oophorectomy, peritoneal biopsies, and partial omentectomy. 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 Estimated blood loss  was 1200 mL. On the evening of surgery she has good pain relief. Vital signs  are stable and she is afebrile. On the first postoperative day she has good  pain relief. She is not yet passing flatus and had nausea and vomiting with  breakfast. However, she is ambulating well and using incentive spirometry.  Temperature is 100.7. Hemoglobin is 8.8, white count 11.6. On the day of  discharge she is ambulating well, passing flatus, tolerating a regular diet.  Vital signs are stable and she is afebrile. Pathology is pending. Incision  is healing well.   CONDITION ON DISCHARGE:  Good.   DIET:  Regular as tolerated.   ACTIVITY:  No lifting, no operation of automobiles, no vaginal entry. She is  to follow up in the office next week. She is to call the office for problems  including but not  limited to temperature of 101 degrees, persistent  nausea/vomiting, or increasing pain.   MEDICATIONS:  1.  Percocet 5/325 mg #30 one to two p.o. q.6h. p.r.n.  2.  Iron daily.  3.  Ibuprofen 600 mg q.6h. p.r.n.      Guy Sandifer Henderson Cloud, M.D.  Electronically Signed    JET/MEDQ  D:  08/02/2005  T:  08/02/2005  Job:  [id number]

## 2010-11-09 NOTE — Discharge Summary (Signed)
[name], Ritter NO.:  [id number]   MEDICAL RECORD NO.:  [id number]          PATIENT TYPE:  INP   LOCATION:  1307                         [facility]ACILITY[facility]  [facility]   PHYSICIAN:  Lauretta I. Odogwu, M.D.DATE OF BIRTH:  06-17-1961   DATE OF ADMISSION:  09/02/2005  DATE OF DISCHARGE:  09/06/2005                                 DISCHARGE SUMMARY   DISCHARGE DIAGNOSES:  1.  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  2.  Status post cycle #1 of cisplatin, etoposide, and bleomycin      chemotherapy.   DISCHARGE MEDICATIONS:  1.  Percocet 5/325 one to two p.o. q. 4 h p.r.n. for pain.  2.  Zofran 8 mg p.o. twice daily for 5 days following chemotherapy, then as      needed for nausea.  3.  Decadron 8 mg twice daily 4 or 5 days following chemotherapy, then as      needed.  4.  Compazine 5 mg 1 to 2 tablets p.o. q. 8 h p.r.n. for nausea.  5.  Vitamin E 400 international units once daily.  6.  Continue all other home medications.   PROCEDURES:  CBC completed on September 02, 2005: WBC 7.9, hemoglobin 12.5,  hematocrit 36, platelets 152.  Comprehensive chemistry panel completed on  September 02, 2005: Sodium 141, potassium 3.6, chloride 107, CO2 28, glucose 97,  BUN 5, creatinine 0.7, calcium 8.9, total protein 5.8, albumin 3.4, AST 16,  ALT 12, alkaline phosphatase 54, total bilirubin 0.4. Magnesium completed  September 05, 2005: 2.   Chemotherapy: Patient dosed with cisplatin at 20 mg per meter squared for a  total of 37.8 mg IV on days #1 through #5, given on March 12 to September 06, 2005.  Etoposide dosed at 100 mg per meter squared for a total of 189 mg  given IV on days #1 through #5 on March 12 to September 06, 2005.  Bleomycin 30  units given IV on day #2, September 03, 2005.  Premedications include Zofran 16  mg IV days #1 through #5; Decadron 20 mg IV days #1 through #5; cisplatin  hydration per protocol.   CONDITION ON DISCHARGE:  Stable.   ADMISSION HISTORY AND PHYSICAL:  [name]  is a pleasant 50 year old  Caucasian female who was admitted to [facility] on September 02, 2005,  for chemotherapy for a granulosa cell tumor of the ovary.  She is status  post laparotomy and bilateral salpingo-opherectomy for a right ovarian adult  granulosa cell tumor on July 31, 2005.  Staging CTs have demonstrated no  evidence of metastatic disease.  Abdominal MRI of the liver dmonstrated  hemorrhagic cysts.  Baseline pulmonary function tests were normal. She had a  port a cath placed in readiness for chemotherapy.   HOSPITAL COURSE:  As previously stated, the patient was admitted to [facility] on September 02, 2005, for cycle #1 of chemotherapy with  cisplatin, etoposide, and bleomycin.  She tolerated chemotherapy well.  She  did report some ongoing hot flashes and was initiated on vitamin E  supplement.  The patient continued to be active and was ambulating daily.  She had no change in her appetite.  She tolerated chemotherapy well with  only some minimal nausea but no vomiting.  She was noted to have some  minimal bloody drainage from the surgical incision at her port site;  however, the port site continued to operate normally.  Based on the  patient's good tolerance of the first cycle of chemotherapy, she was  discharged home on September 06, 2005.   DISCHARGE INSTRUCTIONS:  1.  She is to monitor her temperature twice daily and to call for      temperature greater than 100.5.  2.  She is to increase her intake of fluids and to follow up on September 10, 2005, for chemotherapy with Dr. Arlan Ritter.      Stacy Greathouse Tanner, PA      Lauretta I. Odogwu, M.D.  Electronically Signed    VET/MEDQ  D:  09/06/2005  T:  09/06/2005  Job:  [id number]

## 2010-11-09 NOTE — Op Note (Signed)
[facility]  Patient:    [name], [name]                     MRN: [id number] Proc. Date: 06/09/00 Adm. Date:  [id number] Attending:  Cordelia Pen Ii                           Operative Report  ADDENDUM:  The left ovarian cyst was aspirated for straw-colored serous fluid which was sent for cytology.  Good hemostasis was noted on the ovary. DD:  06/09/00 TD:  06/09/00 Job: [id number]

## 2010-11-09 NOTE — Discharge Summary (Signed)
NAMEJONNELLE, [name]              ACCOUNT NO.:  [id number]   MEDICAL RECORD NO.:  [id number]          PATIENT TYPE:  INP   LOCATION:  1317                         FACILITY:  [facility]   PHYSICIAN:  Lennis P. Darrold Span, M.D.DATE OF BIRTH:  [birth year]   DATE OF ADMISSION:  10/20/2005  DATE OF DISCHARGE:  10/21/2005                                 DISCHARGE SUMMARY   DISCHARGE DIAGNOSES:  1.  Granulosis cell tumor of the ovary, status post cycle 3 of BEP      chemotherapy.  2.  Nausea, vomiting, dehydration, status post her third cycle of BEP      chemotherapy.  3.  History of anemia, suspected secondary to chemotherapy.   DISCHARGE MEDICATIONS:  Patient is instructed to restart all home  medications.   PROCEDURES:  CBC completed on October 21, 2005 with WBC 4.8, hemoglobin 10,  hematocrit 29, platelets 253.  General chemistry panel:  Sodium 137,  potassium 4.3, chloride 103, CO2 27, BUN 13, creatinine 0.8, glucose 93,  calcium 8.8, magnesium 2.  Urinalysis:  Color yellow, clarity turbid,  specific gravity is 1.019, pH 7.5, positive amorphous urates.   CONDITION ON DISCHARGE:  Stable.   HOSPITAL ADMISSION HISTORY AND PHYSICAL:  The patient is a 51 year old  female who is being treated adjuvantly by Dr. Arlan Organ for granulosis  cell tumor of the ovaries, having had her third cycle of BEP chemotherapy on  October 17, 2005 at [facility].  She was discharged home on October 18, 2005 but had increased nausea and vomiting since then and was unable to  keep down p.o. medications or food on October 20, 2005.  She had been using  Compazine suppositories, which she had not previously used and had been  extremely restless, suggesting a reaction to the Compazine.  She has had no  fevers and no problems with her port.  She was diagnosed with granulosis  cell tumor of the ovaries, adult type, in February, 2007 with bilateral  salpingo-oophorectomy and partial omentectomy.  She had three of  planned 3-4  cycles of BEP chemotherapy.  The cisplatin and etoposide given as an  inpatient.  She did not have nausea or vomiting to this extent with her  first two cycles.  She had required GCFF for neutropenia and had been on  Cipro prophylactically.  She was scheduled for chemotherapy in the office,  bleomycin, in May, 2001.   PHYSICAL EXAMINATION:  VITAL SIGNS:  Temp 97.6, pulse 92, blood pressure  133/95, respirations are unlabored.  GENERAL:  An adult white female who appears at her stated age and appears  moderately uncomfortable on a stretcher with IV fluids via her port.  HEENT:  She has alopecia.  Pupils are equal, round and reactive.  She is  anicteric.  Her mouth is dry with lesions.  CHEST:  Port is intact.  LUNGS:  Clear.  CARDIOVASCULAR:  Regular rate and rhythm.  ABDOMEN:  Tender, quiet, soft.  EXTREMITIES:  No edema or cords.   ADMISSION LABS:  WBC is 10.1, ANC 7.4, platelets 397.  Sodium 131, potassium  3.4, chloride 94, CO2 25, BUN 10, glucose 114, creatinine 0.8.   HOSPITAL COURSE:  As previously stated, the patient was admitted to [facility] on October 20, 2005 under the care of Dr. Jama Flavors and was  given IV antiemetics and hydration.  She was seen in followup by Dr.  Vicente Serene I. Odogwu's PA on October 21, 2005, at which time she states that she  is feeling better with only some mild nausea on the morning of October 21, 2005 and no vomiting since the prior evening.  She ate breakfast of clear  liquids without any increase in nausea.  She denies headaches or pain.  After being seen by Dr. Darrold Span, it was discussed that she could be  discharged home later in the evening, should she continue to do well.  The  patient stated that she had continued to do well with no additional vomiting  and only one episode of nausea.  She tolerated her lunch well and requested  to be discharged home.  She was discharged home on October 21, 2005.   DISCHARGE  INSTRUCTIONS:  She is to follow up for chemotherapy and IV fluids.  She is previously scheduled for Oct 22, 2005 and will then follow up with Dr.  Dalene Carrow as previously scheduled.      Stacy Greathouse Tanner, PA      Lennis P. Darrold Span, M.D.  Electronically Signed    VET/MEDQ  D:  10/21/2005  T:  10/21/2005  Job:  [id number]   cc:   Vicente Serene I. Odogwu, M.D.  Fax: [phone]

## 2010-11-09 NOTE — Op Note (Signed)
NAMERACHELANNE, [name] NO.:  [id number]   MEDICAL RECORD NO.:  [id number]          PATIENT TYPE:  AMB   LOCATION:  SDC                           FACILITY:  [facility]   PHYSICIAN:  Guy Sandifer. Henderson Cloud, M.D. DATE OF BIRTH:  1961-05-21   DATE OF PROCEDURE:  07/31/2005  DATE OF DISCHARGE:                                 OPERATIVE REPORT   PREOPERATIVE DIAGNOSIS:  Right ovarian cyst.   POSTOPERATIVE DIAGNOSIS:  Right ovarian tumor.   PROCEDURE:  Exploratory laparotomy with bilateral salpingo-oophorectomy,  peritoneal biopsies, partial omentectomy and laparoscopy.   SURGEON:  Guy Sandifer. Henderson Cloud, M.D.   ANESTHESIA:  General endotracheal intubation.   SPECIMENS:  1.  Bilateral ovaries.  2.  Peritoneal biopsy.  3.  Portion of omentum.   ESTIMATED BLOOD LOSS:  1200 mL.   INDICATIONS AND CONSENT:  The patient is a 50 year old white female status  post LAVH, who has had right lower quadrant pain for the past two or three  days.  Evaluation two days ago beginning with her primary care physician  revealed a white count of approximately 13 and CT and ultrasound findings  consistent with an 8-9 cm right adnexal mass.  The patient is status post  appendectomy.  It was felt the mass was most consistent with an ovarian  process.  The left ovary appeared normal.  There is no evidence of ascites,  free fluid, adenopathy or organomegaly throughout the abdomen and pelvis.  Laparoscopy, possible laparotomy, right salpingo-oophorectomy has been  discussed with the patient preoperatively.  The potential risks and  complications have been discussed preoperatively including but limited to  infection; bowel, bladder or ureteral damage; bleeding requiring transfusion  of blood products with possible transfusion reaction, HIV and hepatitis  acquisition; DVT, PE, pneumonia, laparotomy.  All questions were answered,  and consent is signed on the chart.   FINDINGS:  Upon entry into the abdomen  with a laparoscope, approximately 100  mL of clot is noted in the pelvis.  The upper abdomen appears normal.  There  are no adhesions or studding.  The left ovary appears normal.   PROCEDURE:  The patient is taken to operating room, where she is identified,  placed in the dorsal supine position, and general anesthesia is induced via  endotracheal intubation.  She is then placed in the dorsal lithotomy  position, where she is prepped abdominally and vaginally, the bladder is  straight catheterized.  A ring clamp with a sponge is placed in the vagina  and she is draped in a sterile fashion.  An infraumbilical incision is made  and a disposable Veress needle is placed with a normal syringe and drop  test.  Two liters of gas were insufflated to low pressure with good tympany  in the right upper quadrant.  The Veress needle is removed and a 10/11 Excel  disposable trocar sleeve is placed using direct visualization via the  diagnostic laparoscopic.  This is then replaced with the operative  laparoscope and the above findings are noted.  A small suprapubic incision  is made and a 5-mm Excel  disposable trocar sleeve is placed under direct  visualization without difficulty.  Copious irrigation is carried out to  remove the clot.  Any attempt to gently manipulate the adnexal mass easily  shreds the capsule.  This produces more bleeding.  A decision is therefore  made to proceed to laparotomy.  Trocar sleeves are removed and a  Pfannenstiel incision is made down to the peritoneum, which is extended  superiorly and inferiorly.  An O'Connor-O'Sullivan retractor is placed and  the bowel is packed away and the upper and lower blades are placed.  The the  right ovarian tumor is then elevated, and careful inspection reveals the  right ureter to be clear.  The infundibulopelvic ligament is then taken down  and doubly ligated with suture and a free tie of 0 Monocryl suture.  The  ovary is freed up with  remaining pedicles being taken.  The specimen is then  sent for frozen section.  Copious irrigation is carried out to irrigate the  paracolic gutters.  While waiting for the frozen section, a Foley catheter  is placed in the bladder.  Just a small amount of clear urine is noted.  Careful inspection abdominally reveals proper placement of the Foley  catheter and no evidence of bladder laceration.  The patient is then given  indigo carmine intravenously.  Blue urine is then noted in the Foley bag.  Again, careful inspection and irrigation reveals no evidence of bladder  perforation.  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  At that point a left salpingo-oophorectomy as well as a  partial omentectomy is performed.  Manual exploration reveals all the  surfaces to be smooth.  No adenopathy or organomegaly is palpated.  There  were five or six 2-3 mm implants on the cecum that were felt to probably be  adipose tissue secondary to her appendectomy.  However, these were biopsied  in the very superficial fascia and also sent to pathology.  Careful  inspection again revealed the course of the ureters to be clear of the  surgery bilaterally with peristalsis noted in each ureter.  The anterior  peritoneum is closed in running fashion with 0 Monocryl suture, which is  also used to reapproximate the pyramidalis muscle in the midline.  The  anterior rectus fascia is closed in running fashion with 0 PDS and the skin  is closed with clips.  All counts are correct.  The patient is awakened and  taken to the recovery room in stable condition.      Guy Sandifer Henderson Cloud, M.D.  Electronically Signed     JET/MEDQ  D:  07/31/2005  T:  07/31/2005  Job:  [id number]

## 2010-11-09 NOTE — H&P (Signed)
[name]              ACCOUNT NO.:  [id number]   MEDICAL RECORD NO.:  [id number]          PATIENT TYPE:  INP   LOCATION:                                 FACILITY:   PHYSICIAN:  Lauretta I. Odogwu, M.D.DATE OF BIRTH:  [birth year]   DATE OF ADMISSION:  09/23/2005  DATE OF DISCHARGE:                                HISTORY & PHYSICAL   IDENTIFYING INFORMATION:  [name] is a pleasant 50 year old Caucasian  female who is admitted to [facility] on September 23, 2005 for cycle  two of chemotherapy for a disgerminoma of the ovary (adult type).   HISTORY OF PRESENT ILLNESS:  [name] is a pleasant 50 year old Caucasian  female with a history of a granulosis cell tumor of the ovary. The patient  noted intermittent mild pelvic pain with stomach cramps dating to January of  2007. A CT scan of the abdomen and pelvis completed on July 29, 2005  showed a 9.5 x 6 cm mass in the right pelvis with free fluid in the cul-de-  sac. Pelvic ultrasound of July 29, 2005 demonstrated a mixed solid and  cystic mass in the right adnexa measuring 6.2 x 9.8 x 8.1 cm. The patient  underwent a laparotomy with bilateral salpingo-oophorectomy. Peritoneal  biopsies were taken and a partial omentectomy was performed. The patient was  noted to have a hemorrhagic peritoneal fluid. 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 The patient was staged as a  1C.  A chest CT scan of August 28, 2005 with results showing no metastatic  disease of the chest with enhancing lesions with the portion of the liver  which was visualized. An MRI of the abdomen completed on August 24, 2005  showed numerous hypervascular lesions consistent with hemangiomas not  demonstrated on ultrasound. Pulmonary function tests were normal. A port was  placed and the patient was admitted to [facility] on August 30, 2005  for cycle one of her cisplatin, bleomycin and etoposide chemotherapy.  Since  that time she has been treated with bleomycin on March 20 and September 17, 2005. Following this she was dosed with Neupogen on March 28 and September 19, 2005. During the weekend of September 15, 2005 she was noted to have a fever and  was treated with ciprofloxacin for 5 days. She is admitted to [facility] on September 23, 2005 for cycle two of her chemotherapy.   PAST MEDICAL HISTORY:  1.  Status post partial hysterectomy in 2001 for dysfunctional uterine      bleeding.  2.  Status post appendectomy.  3.  Status post tonsillectomy.  4.  Status post excision of a left hip cyst.   SOCIAL HISTORY:  The patient is divorced with three children. She lives in  the [place] area. 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 She has  a 30 pack year history of smoking. She denies alcohol use.   FAMILY HISTORY:  An uncle with prostate cancer.   HEALTH MAINTENANCE:  Her mammograms are up to  date.   MEDICATIONS:  1.  Vicodin 5/500 1-2 p.o. q.6 hours p.r.n. pain.  2.  Ambien 10 mg p.o. q.h.s. p.r.n.  3.  Tranxene T-Tabs 7 mg p.o. b.i.d. p.r.n.  4.  Vitamin E 400 international units once daily.  5.  Iron 325 mg b.i.d.   ALLERGIES:  No known allergies.   REVIEW OF SYSTEMS:  Positive for ongoing hot flashes with night sweats,  decreased from her prior presentation. Alopecia. Slight nausea following  chemotherapy with no vomiting. A history of fevers and shaking chills on  weekend of March 24 and 25, 2007, now resolved.   PHYSICAL EXAMINATION:  GENERAL:  The patient is an adult Caucasian female  who appears to be in no acute distress.  VITAL SIGNS:  Weight 176.7. Temperature 98.7, pulse 95, respirations 20,  blood pressure 117/71.  HEENT EXAM:  Shows alopecia. Sclerae anicteric. Mouth shows no ulcerations  or lesions.  LYMPH NODES:  No lymphadenopathy is appreciated in the cervical,  supraclavicular, axillary, epitrochlear, inguinal areas.  LUNGS:  Clear to auscultation without  wheezes, rales or rhonchi. There is a  port in the right chest wall without erythema or exudate.  CARDIOVASCULAR:  Regular rate and rhythm without murmurs, rubs, or gallops.  ABDOMEN:  There is a well-healed surgical incision at the umbilicus. No  organomegaly or tenderness, rebound or guarding are noted. Positive bowel  sounds are present.  EXTREMITIES:  Show no clubbing, cyanosis or edema.  CNS/PNS:  No focal deficits.   LABORATORY DATA:  WBC 9.4 thousand, hemoglobin 11.6, hematocrit 33.8,  platelets 224,000.   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 She is admitted  to [facility] on September 23, 2005 for cycle two of her cisplatin (D1-  5, bleomycin (D2 only) and etoposide (D1-5). Based on her anemia she was  dosed with Aranesp at the [facility] at 300 mcg on September 20, 2005. The patient is a full code.      [name], PA      Lauretta I. Odogwu, M.D.  Electronically Signed    VET/MEDQ  D:  09/20/2005  T:  09/20/2005  Job:  [id number]

## 2010-11-09 NOTE — H&P (Signed)
NAMEVERL, WHITMORE NO.:  [id number]   MEDICAL RECORD NO.:  [id number]            PATIENT TYPE:   LOCATION:                               FACILITY:  [facility]   PHYSICIAN:  Ines Bloomer, M.D.      DATE OF BIRTH:   DATE OF ADMISSION:  DATE OF DISCHARGE:                              HISTORY & PHYSICAL   CHIEF COMPLAINT:  Thymic mass.   HISTORY OF PRESENT ILLNESS:  This 50 year old Caucasian female has had a  long complicated history.  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  She has a history of having  ovarian cancer and had surgery and chemotherapy, and her recent CT scan  shows a mediastinal mass and a ground-glass nodule in the right middle  lobe that was unchanged.  She also has some stable thyroid nodules.  She  has been known to have hepatic lesions that have been thought to be  cystic and have been followed with MRIs.  She has no evidence of  excessive sputum, weight loss, myasthenia gravis.   PAST MEDICAL HISTORY:  Significant also for arthritis, and she had a  hysterectomy in 2001, appendectomy in 1989, tonsillectomy in 1965 and  tubal ligation in 1973.   MEDICATIONS:  1. Synthroid 0.4 mg a day.  2. Temazepam 15 mg a day.  3. Oxycodone 5-500 as needed.  4. __________ 300 mg at bedtime.   FAMILY HISTORY:  Positive for vascular disease in her father.   SOCIAL HISTORY:  She is single, has two children, works as a Diplomatic Services operational officer,  smokes a pack a day, does not drink alcohol on a regular basis.   REVIEW OF SYSTEMS:  VITAL SIGNS:  Weight is 178 pounds.  She is 5 foot 5  inches.  CARDIAC:  No angina or atrial fibrillation.  PULMONARY:  No  bronchitis, hemoptysis or wheezing.  GI:  No nausea, vomiting,  constipation or diarrhea.  GU:  No kidney disease.  VASCULAR:  She has  some pain in her legs when walking.  No TIAs or DVT.  NEUROLOGICAL:  No  headaches, blackouts, seizures.  MUSCULOSKELETAL:  She has joint and  muscular pain.  She felt this may be related to her chemotherapy.  Dr.  Dalene Carrow is her oncologist.  PSYCHIATRIC:  No psychiatric illnesses.  ENT:  No changes in her eyesight.  Recent decrease in her hearing.  HEMATOLOGICAL:  No problems with anemia or clotting disorders.   PHYSICAL EXAMINATION:  GENERAL:  She is a well-developed Caucasian  female, no acute distress.  VITAL SIGNS:  Her blood pressure is 112/80, pulse 72, respirations 18,  SATs are 99%  HEENT:  Head is atraumatic.  Eyes:  Pupils equal, reactive to light and  accommodation.  Extraocular movements are normal.  Ears:  Tympanic  membranes intact.  Nose:  There is no septal deviation.  Throat is  without lesions.  NECK:  Supple without thyromegaly or carotid bruits.  No supraclavicular  axillary  adenopathy.  CHEST:  Clear to auscultation and percussion.  HEART:  Regular sinus rhythm, no murmurs.  ABDOMEN:  Soft.  There is no hepatosplenomegaly.  EXTREMITIES:  Pulses are 2+.  There is no clubbing or edema.  NEUROLOGICAL:  She is oriented x3.  Sensory and motor intact.  Cranial  nerves intact.  SKIN:  Without lesions.   IMPRESSION:  1. Enlarging thymic mass.  2. History of ovarian cancer.  3. History of arthritis.  4. History of cystic disease.   PLAN:  Partial median sternotomy with thymectomy.           ______________________________  Ines Bloomer, M.D.     DPB/MEDQ  D:  06/18/2006  T:  06/18/2006  Job:  [id number]

## 2011-03-18 LAB — POCT HEMOGLOBIN-HEMACUE: Operator id: 268271

## 2011-03-22 LAB — POCT HEMOGLOBIN-HEMACUE: Hemoglobin: 13.1

## 2011-03-27 LAB — INHIBIN A: Inhibin-A: <1

## 2011-04-11 ENCOUNTER — Ambulatory Visit (INDEPENDENT_AMBULATORY_CARE_PROVIDER_SITE_OTHER): Payer: 59 | Admitting: *Deleted

## 2011-04-11 DIAGNOSIS — Z95810 Presence of automatic (implantable) cardiac defibrillator: Secondary | ICD-10-CM

## 2011-04-11 DIAGNOSIS — I472 Ventricular tachycardia: Secondary | ICD-10-CM

## 2011-04-12 ENCOUNTER — Other Ambulatory Visit: Payer: Self-pay | Admitting: Internal Medicine

## 2011-04-12 ENCOUNTER — Encounter: Payer: Self-pay | Admitting: Internal Medicine

## 2011-04-12 LAB — REMOTE ICD DEVICE
AL AMPLITUDE: 2.2 mv
AL IMPEDENCE ICD: 450 Ohm
BAMS-0003: 70 {beats}/min
RV LEAD IMPEDENCE ICD: 980 Ohm
TZAT-0004FASTVT: 8
TZAT-0012FASTVT: 200 ms
TZAT-0013FASTVT: 3
TZAT-0020FASTVT: 1 ms
TZON-0004FASTVT: 16
TZON-0005FASTVT: 6
TZST-0001FASTVT: 3
TZST-0001FASTVT: 5
TZST-0003FASTVT: 36 J
TZST-0003FASTVT: 40 J

## 2011-04-23 ENCOUNTER — Encounter: Payer: Self-pay | Admitting: *Deleted

## 2011-04-23 NOTE — Progress Notes (Signed)
icd remote check  

## 2011-07-11 ENCOUNTER — Encounter: Payer: Self-pay | Admitting: Internal Medicine

## 2011-07-11 ENCOUNTER — Ambulatory Visit (INDEPENDENT_AMBULATORY_CARE_PROVIDER_SITE_OTHER): Payer: 59 | Admitting: *Deleted

## 2011-07-11 DIAGNOSIS — I472 Ventricular tachycardia: Secondary | ICD-10-CM

## 2011-07-11 DIAGNOSIS — Z95810 Presence of automatic (implantable) cardiac defibrillator: Secondary | ICD-10-CM

## 2011-07-11 LAB — REMOTE ICD DEVICE
AL AMPLITUDE: 2.6 mv
ATRIAL PACING ICD: 0 pct
BAMS-0003: 70 {beats}/min
DEV-0020ICD: NEGATIVE
DEVICE MODEL ICD: 778164
RV LEAD AMPLITUDE: 12 mv
TZAT-0001FASTVT: 1
TZAT-0012FASTVT: 200 ms
TZAT-0013FASTVT: 3
TZAT-0020FASTVT: 1 ms
TZON-0005SLOWVT: 6
TZST-0001FASTVT: 2
TZST-0001FASTVT: 5
TZST-0003FASTVT: 25 J
TZST-0003FASTVT: 36 J
TZST-0003FASTVT: 40 J

## 2011-07-22 ENCOUNTER — Encounter: Payer: Self-pay | Admitting: *Deleted

## 2011-07-22 NOTE — Progress Notes (Signed)
Remote icd check  

## 2011-11-27 ENCOUNTER — Telehealth: Payer: Self-pay | Admitting: Internal Medicine

## 2011-11-27 NOTE — Telephone Encounter (Signed)
11-27-11 lmm @ 414pm for pt to call to set up past due defib ck with device clinic/mt

## 2012-02-04 ENCOUNTER — Encounter: Payer: Self-pay | Admitting: Cardiology

## 2012-02-04 ENCOUNTER — Ambulatory Visit (INDEPENDENT_AMBULATORY_CARE_PROVIDER_SITE_OTHER): Payer: BC Managed Care – PPO | Admitting: Cardiology

## 2012-02-04 ENCOUNTER — Encounter: Payer: Self-pay | Admitting: *Deleted

## 2012-02-04 ENCOUNTER — Encounter: Payer: Self-pay | Admitting: Internal Medicine

## 2012-02-04 VITALS — BP 110/78 | HR 84 | Ht 66.0 in | Wt 223.0 lb

## 2012-02-04 DIAGNOSIS — T82110A Breakdown (mechanical) of cardiac electrode, initial encounter: Secondary | ICD-10-CM

## 2012-02-04 DIAGNOSIS — Z95810 Presence of automatic (implantable) cardiac defibrillator: Secondary | ICD-10-CM

## 2012-02-04 DIAGNOSIS — I4729 Other ventricular tachycardia: Secondary | ICD-10-CM

## 2012-02-04 DIAGNOSIS — I428 Other cardiomyopathies: Secondary | ICD-10-CM

## 2012-02-04 DIAGNOSIS — T82198A Other mechanical complication of other cardiac electronic device, initial encounter: Secondary | ICD-10-CM

## 2012-02-04 DIAGNOSIS — I472 Ventricular tachycardia: Secondary | ICD-10-CM

## 2012-02-04 LAB — ICD DEVICE OBSERVATION
AL IMPEDENCE ICD: 690 Ohm
AL THRESHOLD: 0.5 V
BAMS-0003: 70 {beats}/min
DEV-0020ICD: NEGATIVE
DEVICE MODEL ICD: 778164
RV LEAD AMPLITUDE: 12 mv
RV LEAD THRESHOLD: 2.75 V
TZAT-0001FASTVT: 1
TZAT-0012FASTVT: 200 ms
TZAT-0020FASTVT: 1 ms
TZON-0005FASTVT: 6
TZON-0005SLOWVT: 6
TZON-0010SLOWVT: 40 ms
TZST-0001FASTVT: 2
TZST-0001FASTVT: 5
TZST-0003FASTVT: 25 J
TZST-0003FASTVT: 40 J
VENTRICULAR PACING ICD: 1 pct

## 2012-02-04 NOTE — Progress Notes (Signed)
 ELECTROPHYSIOLOGY OFFICE NOTE  Patient ID: [name] MRN: [id number], DOB/AGE: 07/15/1960   Date of Visit: 02/04/2012  Primary Physician: Tammy Spear, MD Primary Cardiologist: Henry Smith, MD Primary EP: Gregg Taylor, MD Reason for Visit: Device follow-up; RV lead impedance increased on remote check  History of Present Illness [name] is a pleasant 51 year old woman with CAD s/p MI complicated by pseudoaneurysm, s/p aneurysmectomy and CABG who had recurrent VT postoperatively with ICD placement while hospitalized in May 2011. She presents today for device follow-up after a remote check alerted a problem with her RV lead. The RV lead impedance is >2000 ohms. She is anxious but denies any other complaints. She denies CP, SOB, palpitations, dizziness, near syncope or syncope. She denies ICD shocks. She does not report any CHF symptoms.   Past Medical History  Diagnosis Date  . Ischemic cardiomyopathy   . Chronic systolic heart failure   . Ventricular tachycardia s/p dual chamber ICD implanted in [place] - May 2011   . Coronary artery disease s/p MI complicated by pseudoaneurysm s/p aneurysmectomy and CABG in [place], [place] - May 2011   . Ovarian cancer s/p resection and chemotherapy     Past Surgical History  Procedure Date  . Insert / replace / remove pacemaker     DUAL-CHAMBER [employer]UDE DEFIBRILLATOR    Allergies/Intolerances Allergen Reactions  . Prochlorperazine Edisylate    Current Home Medications Current Outpatient Prescriptions  Medication Sig Dispense Refill  . amiodarone (PACERONE) 200 MG tablet Take 200 mg by mouth daily.        . aspirin 325 MG EC tablet Take 325 mg by mouth daily.        . atorvastatin (LIPITOR) 40 MG tablet Take 40 mg by mouth daily.      . carvedilol (COREG) 6.25 MG tablet Take 6.25 mg by mouth 2 (two) times daily with a meal.        . Cholecalciferol (VITAMIN D) 1000 UNITS capsule Take 1,000 Units by mouth 2 (two) times daily.         . EVENING PRIMROSE OIL PO Take by mouth 2 (two) times daily.        . ezetimibe (ZETIA) 10 MG tablet Take 10 mg by mouth daily.      . KRILL OIL 1000 MG CAPS Take 2 capsules by mouth daily.        . levothyroxine (SYNTHROID, LEVOTHROID) 75 MCG tablet Take 75 mcg by mouth daily.      . Multiple Vitamin (MULTIVITAMIN) tablet Take 1 tablet by mouth daily.        . simvastatin (ZOCOR) 40 MG tablet Take 40 mg by mouth at bedtime.        . spironolactone (ALDACTONE) 25 MG tablet Take 25 mg by mouth 2 (two) times daily.          Social History Social History  . Marital Status: Divorced   Social History Main Topics  . Smoking status: Smoker  . Smokeless tobacco: Never Used  . Alcohol Use: No  . Drug Use: No   Review of Systems General: No chills, fever, night sweats or weight changes Cardiovascular: No chest pain, dyspnea on exertion, edema, orthopnea, palpitations, paroxysmal nocturnal dyspnea Dermatological: No rash, lesions or masses Respiratory: No cough, dyspnea Urologic: No hematuria, dysuria Abdominal: No nausea, vomiting, diarrhea, bright red blood per rectum, melena, or hematemesis Neurologic: No visual changes, weakness, changes in mental status All other systems reviewed and   are otherwise negative except as noted above.  Physical Exam Blood pressure 110/78, pulse 84, height 5' 6" (1.676 m), weight 223 lb (101.152 kg).  General: Well developed, well appearing 51 year old female in no acute distress. HEENT: Normocephalic, atraumatic. EOMs intact. Sclera nonicteric. Oropharynx clear.  Neck: Supple. No JVD. Lungs: Respirations regular and unlabored, CTA bilaterally. No wheezes, rales or rhonchi. Heart: RRR. S1, S2 present. No murmurs, rub, S3 or S4. Abdomen: Soft, non-distended.  Extremities: No clubbing, cyanosis or edema. DP/PT/Radials 2+ and equal bilaterally. Psych: Normal affect. Neuro: Alert and oriented X 3. Moves all extremities spontaneously.   Diagnostics Device  interrogation shows RV lead exit block with gradual rise in RV lead impedance, now >2000 ohms; RV lead threshold is high; atrial lead measurements are stable; shock impedance is stable; no programming changes made; see PaceArt report  Assessment and Plan 1. RV lead exit block - discussed/reviewed the indications for RV lead revision and the procedure involved, including risks and benefits; these risks include, but are not limited to, bleeding, infection, pneumothorax, pericardial perforation, tamponade, vascular damage, renal failure, lead dislodgement, MI, stroke and death; [name] expressed verbal understanding and wishes to proceed with RV lead revision on Thursday, February 06, 2012. 2. VT s/p ICD implantation for secondary prevention of SCD 3. CAD - stable without anginal symptoms; followed by Dr. Henry Smith  This plan of care was formulated with Dr. Gregg Taylor via phone. Signed, Guerino Caporale, PA-C 02/04/2012, 4:29 PM   

## 2012-02-04 NOTE — Patient Instructions (Signed)
See instruction sheet for lead revision   

## 2012-02-05 ENCOUNTER — Encounter (HOSPITAL_COMMUNITY): Payer: Self-pay | Admitting: Pharmacy Technician

## 2012-02-05 LAB — BASIC METABOLIC PANEL
BUN: 12 mg/dL (ref 6–23)
CO2: 25 mEq/L (ref 19–32)
Chloride: 98 mEq/L (ref 96–112)
Creatinine, Ser: 1.2 mg/dL (ref 0.4–1.2)
Glucose, Bld: 81 mg/dL (ref 70–99)
Potassium: 4.4 mEq/L (ref 3.5–5.1)

## 2012-02-05 LAB — CBC WITH DIFFERENTIAL/PLATELET
Basophils Relative: 0.1 % (ref 0.0–3.0)
Eosinophils Relative: 1 % (ref 0.0–5.0)
Hemoglobin: 13.2 g/dL (ref 12.0–15.0)
Lymphocytes Relative: 26.5 % (ref 12.0–46.0)
MCHC: 33 g/dL (ref 30.0–36.0)
Monocytes Relative: 6.6 % (ref 3.0–12.0)
Neutro Abs: 8.4 10*3/uL — ABNORMAL HIGH (ref 1.4–7.7)
Neutrophils Relative %: 65.8 % (ref 43.0–77.0)
RBC: 4.1 Mil/uL (ref 3.87–5.11)
WBC: 12.7 10*3/uL — ABNORMAL HIGH (ref 4.5–10.5)

## 2012-02-05 LAB — PROTIME-INR: Prothrombin Time: 10.6 s (ref 10.2–12.4)

## 2012-02-05 MED ORDER — SODIUM CHLORIDE 0.9 % IR SOLN
80.0000 mg | Status: DC
  Filled 2012-02-05: qty 2

## 2012-02-05 MED ORDER — SODIUM CHLORIDE 0.9 % IJ SOLN: 3.0000 mL | Freq: Two times a day (BID) | INTRAMUSCULAR | Status: DC

## 2012-02-05 MED ORDER — SODIUM CHLORIDE 0.9 % IJ SOLN: 3.0000 mL | INTRAMUSCULAR | Status: DC | PRN

## 2012-02-05 MED ORDER — SODIUM CHLORIDE 0.45 % IV SOLN
INTRAVENOUS | Status: DC
  Administered 2012-02-06: 12:00:00 via INTRAVENOUS

## 2012-02-05 MED ORDER — CEFAZOLIN SODIUM-DEXTROSE 2-3 GM-% IV SOLR
2.0000 g | INTRAVENOUS | Status: DC
  Filled 2012-02-05: qty 50

## 2012-02-05 MED ORDER — SODIUM CHLORIDE 0.9 % IV SOLN
250.0000 mL | INTRAVENOUS | Status: DC
  Administered 2012-02-06: 1000 mL via INTRAVENOUS

## 2012-02-05 MED ORDER — CHLORHEXIDINE GLUCONATE 4 % EX LIQD
60.0000 mL | Freq: Once | CUTANEOUS | Status: DC
  Filled 2012-02-05: qty 60

## 2012-02-06 ENCOUNTER — Encounter (HOSPITAL_COMMUNITY): Payer: Self-pay | Admitting: General Practice

## 2012-02-06 ENCOUNTER — Encounter (HOSPITAL_COMMUNITY)
Admission: RE | Disposition: A | Discharge status: Home / Self Care | Payer: Self-pay | Source: Ambulatory Visit | Attending: Internal Medicine

## 2012-02-06 ENCOUNTER — Ambulatory Visit (HOSPITAL_COMMUNITY)
Admission: RE | Admit: 2012-02-06 | Discharge: 2012-02-07 | Disposition: A | Discharge status: Home / Self Care | Payer: BC Managed Care – PPO | Source: Ambulatory Visit | Attending: Internal Medicine | Admitting: Internal Medicine

## 2012-02-06 ENCOUNTER — Ambulatory Visit (HOSPITAL_COMMUNITY): Payer: BC Managed Care – PPO

## 2012-02-06 DIAGNOSIS — Z951 Presence of aortocoronary bypass graft: Secondary | ICD-10-CM | POA: Insufficient documentation

## 2012-02-06 DIAGNOSIS — I4901 Ventricular fibrillation: Secondary | ICD-10-CM

## 2012-02-06 DIAGNOSIS — Z8543 Personal history of malignant neoplasm of ovary: Secondary | ICD-10-CM | POA: Insufficient documentation

## 2012-02-06 DIAGNOSIS — I2510 Atherosclerotic heart disease of native coronary artery without angina pectoris: Secondary | ICD-10-CM | POA: Insufficient documentation

## 2012-02-06 DIAGNOSIS — I2589 Other forms of chronic ischemic heart disease: Secondary | ICD-10-CM | POA: Insufficient documentation

## 2012-02-06 DIAGNOSIS — Z8674 Personal history of sudden cardiac arrest: Secondary | ICD-10-CM | POA: Insufficient documentation

## 2012-02-06 DIAGNOSIS — I509 Heart failure, unspecified: Secondary | ICD-10-CM | POA: Insufficient documentation

## 2012-02-06 DIAGNOSIS — I428 Other cardiomyopathies: Secondary | ICD-10-CM

## 2012-02-06 DIAGNOSIS — T82198A Other mechanical complication of other cardiac electronic device, initial encounter: Secondary | ICD-10-CM

## 2012-02-06 DIAGNOSIS — Z95810 Presence of automatic (implantable) cardiac defibrillator: Secondary | ICD-10-CM

## 2012-02-06 DIAGNOSIS — I472 Ventricular tachycardia: Secondary | ICD-10-CM

## 2012-02-06 DIAGNOSIS — Z954 Presence of other heart-valve replacement: Secondary | ICD-10-CM

## 2012-02-06 DIAGNOSIS — I5022 Chronic systolic (congestive) heart failure: Secondary | ICD-10-CM | POA: Insufficient documentation

## 2012-02-06 DIAGNOSIS — M1990 Unspecified osteoarthritis, unspecified site: Secondary | ICD-10-CM

## 2012-02-06 DIAGNOSIS — Y831 Surgical operation with implant of artificial internal device as the cause of abnormal reaction of the patient, or of later complication, without mention of misadventure at the time of the procedure: Secondary | ICD-10-CM | POA: Insufficient documentation

## 2012-02-06 DIAGNOSIS — T82110A Breakdown (mechanical) of cardiac electrode, initial encounter: Secondary | ICD-10-CM

## 2012-02-06 DIAGNOSIS — I5020 Unspecified systolic (congestive) heart failure: Secondary | ICD-10-CM

## 2012-02-06 HISTORY — DX: Pure hypercholesterolemia, unspecified: E78.00

## 2012-02-06 HISTORY — DX: Hypothyroidism, unspecified: E03.9

## 2012-02-06 HISTORY — DX: Presence of automatic (implantable) cardiac defibrillator: Z95.810

## 2012-02-06 HISTORY — DX: Malignant neoplasm of unspecified ovary: C56.9

## 2012-02-06 HISTORY — PX: CARDIAC DEFIBRILLATOR PLACEMENT: SHX171

## 2012-02-06 HISTORY — DX: Acute myocardial infarction, unspecified: I21.9

## 2012-02-06 HISTORY — PX: IMPLANTABLE CARDIOVERTER DEFIBRILLATOR REVISION: SHX5470

## 2012-02-06 HISTORY — DX: Unspecified osteoarthritis, unspecified site: M19.90

## 2012-02-06 HISTORY — DX: Malignant neoplasm of bladder, unspecified: C67.9

## 2012-02-06 SURGERY — IMPLANTABLE CARDIOVERTER DEFIBRILLATOR REVISION
Anesthesia: LOCAL

## 2012-02-06 MED ORDER — MIDAZOLAM HCL 5 MG/5ML IJ SOLN
INTRAMUSCULAR | Status: AC
  Filled 2012-02-06: qty 5

## 2012-02-06 MED ORDER — CEFAZOLIN SODIUM-DEXTROSE 2-3 GM-% IV SOLR
INTRAVENOUS | Status: AC
  Filled 2012-02-06: qty 50

## 2012-02-06 MED ORDER — CARVEDILOL 6.25 MG PO TABS
6.2500 mg | ORAL_TABLET | Freq: Two times a day (BID) | ORAL | Status: DC
  Filled 2012-02-06 (×4): qty 1

## 2012-02-06 MED ORDER — SPIRONOLACTONE 25 MG PO TABS
25.0000 mg | ORAL_TABLET | Freq: Two times a day (BID) | ORAL | Status: DC
  Filled 2012-02-06 (×5): qty 1

## 2012-02-06 MED ORDER — EZETIMIBE 10 MG PO TABS
10.0000 mg | ORAL_TABLET | Freq: Every day | ORAL | Status: DC
  Administered 2012-02-06: 10 mg via ORAL
  Filled 2012-02-06 (×2): qty 1

## 2012-02-06 MED ORDER — CEFAZOLIN SODIUM 1-5 GM-% IV SOLN
1.0000 g | Freq: Four times a day (QID) | INTRAVENOUS | Status: AC
  Administered 2012-02-06 – 2012-02-07 (×3): 1 g via INTRAVENOUS
  Filled 2012-02-06 (×3): qty 50

## 2012-02-06 MED ORDER — ONDANSETRON HCL 4 MG/2ML IJ SOLN: 4.0000 mg | Freq: Four times a day (QID) | INTRAMUSCULAR | Status: DC | PRN

## 2012-02-06 MED ORDER — YOU HAVE A PACEMAKER BOOK
Freq: Once | Status: AC
  Administered 2012-02-06: 19:00:00
  Filled 2012-02-06: qty 1

## 2012-02-06 MED ORDER — SIMVASTATIN 40 MG PO TABS
40.0000 mg | ORAL_TABLET | Freq: Every day | ORAL | Status: DC
  Administered 2012-02-06: 23:00:00 40 mg via ORAL
  Filled 2012-02-06 (×3): qty 1

## 2012-02-06 MED ORDER — HYDROCODONE-ACETAMINOPHEN 5-325 MG PO TABS
1.0000 | ORAL_TABLET | Freq: Four times a day (QID) | ORAL | Status: DC | PRN
  Administered 2012-02-06 – 2012-02-07 (×3): 1 via ORAL
  Filled 2012-02-06 (×3): qty 1

## 2012-02-06 MED ORDER — LEVOTHYROXINE SODIUM 75 MCG PO TABS
75.0000 ug | ORAL_TABLET | Freq: Every day | ORAL | Status: DC
  Administered 2012-02-07: 75 ug via ORAL
  Filled 2012-02-06 (×3): qty 1

## 2012-02-06 MED ORDER — SODIUM CHLORIDE 0.9 % IV SOLN
Freq: Once | INTRAVENOUS | Status: AC
  Administered 2012-02-06: 16:00:00 via INTRAVENOUS

## 2012-02-06 MED ORDER — ASPIRIN EC 325 MG PO TBEC
325.0000 mg | DELAYED_RELEASE_TABLET | Freq: Every day | ORAL | Status: DC
  Administered 2012-02-06 – 2012-02-07 (×2): 325 mg via ORAL
  Filled 2012-02-06 (×2): qty 1

## 2012-02-06 MED ORDER — LIDOCAINE HCL (PF) 1 % IJ SOLN
INTRAMUSCULAR | Status: AC
  Filled 2012-02-06: qty 60

## 2012-02-06 MED ORDER — FENTANYL CITRATE 0.05 MG/ML IJ SOLN
INTRAMUSCULAR | Status: AC
  Filled 2012-02-06: qty 2

## 2012-02-06 MED ORDER — AMIODARONE HCL 100 MG PO TABS
100.0000 mg | ORAL_TABLET | Freq: Every day | ORAL | Status: DC
  Administered 2012-02-07: 100 mg via ORAL
  Filled 2012-02-06: qty 1

## 2012-02-06 MED ORDER — MUPIROCIN 2 % EX OINT
TOPICAL_OINTMENT | Freq: Once | CUTANEOUS | Status: AC
  Administered 2012-02-06: 1 via NASAL

## 2012-02-06 MED ORDER — ACETAMINOPHEN 325 MG PO TABS: 325.0000 mg | ORAL_TABLET | ORAL | Status: DC | PRN

## 2012-02-06 NOTE — Op Note (Signed)
EP Procedure Note  ICD lead revision with insertion of a new ICD lead, contrast venography, device removal and reinsertion and pocket revision with DFT testing carried out without immediate complication. [id number].

## 2012-02-06 NOTE — H&P (View-Only) (Signed)
ELECTROPHYSIOLOGY OFFICE NOTE  Patient ID: [name] MRN: [id number], DOB/AGE: 1960/12/26   Date of Visit: 02/04/2012  Primary Physician: Herb Grays, MD Primary Cardiologist: Verdis Prime, MD Primary EP: Lewayne Bunting, MD Reason for Visit: Device follow-up; RV lead impedance increased on remote check  History of Present Illness [name] is a pleasant 51 year old woman with CAD s/p MI complicated by pseudoaneurysm, s/p aneurysmectomy and CABG who had recurrent VT postoperatively with ICD placement while hospitalized in May 2011. She presents today for device follow-up after a remote check alerted a problem with her RV lead. The RV lead impedance is >2000 ohms. She is anxious but denies any other complaints. She denies CP, SOB, palpitations, dizziness, near syncope or syncope. She denies ICD shocks. She does not report any CHF symptoms.   Past Medical History  Diagnosis Date  . Ischemic cardiomyopathy   . Chronic systolic heart failure   . Ventricular tachycardia s/p dual chamber ICD implanted in [place], [place] - May 2011   . Coronary artery disease s/p MI complicated by pseudoaneurysm s/p aneurysmectomy and CABG in [place], [place] - May 2011   . Ovarian cancer s/p resection and chemotherapy     Past Surgical History  Procedure Date  . Insert / replace / remove pacemaker     DUAL-CHAMBER St[employer] JUDE DEFIBRILLATOR    Allergies/Intolerances Allergen Reactions  . Prochlorperazine Edisylate    Current Home Medications Current Outpatient Prescriptions  Medication Sig Dispense Refill  . amiodarone (PACERONE) 200 MG tablet Take 200 mg by mouth daily.        Marland Kitchen aspirin 325 MG EC tablet Take 325 mg by mouth daily.        Marland Kitchen atorvastatin (LIPITOR) 40 MG tablet Take 40 mg by mouth daily.      . carvedilol (COREG) 6.25 MG tablet Take 6.25 mg by mouth 2 (two) times daily with a meal.        . Cholecalciferol (VITAMIN D) 1000 UNITS capsule Take 1,000 Units by mouth 2 (two) times daily.         Marland Kitchen EVENING PRIMROSE OIL PO Take by mouth 2 (two) times daily.        Marland Kitchen ezetimibe (ZETIA) 10 MG tablet Take 10 mg by mouth daily.      Marland Kitchen KRILL OIL 1000 MG CAPS Take 2 capsules by mouth daily.        Marland Kitchen levothyroxine (SYNTHROID, LEVOTHROID) 75 MCG tablet Take 75 mcg by mouth daily.      . Multiple Vitamin (MULTIVITAMIN) tablet Take 1 tablet by mouth daily.        . simvastatin (ZOCOR) 40 MG tablet Take 40 mg by mouth at bedtime.        Marland Kitchen spironolactone (ALDACTONE) 25 MG tablet Take 25 mg by mouth 2 (two) times daily.          Social History Social History  . Marital Status: Divorced   Social History Main Topics  . Smoking status: Smoker  . Smokeless tobacco: Never Used  . Alcohol Use: No  . Drug Use: No   Review of Systems General: No chills, fever, night sweats or weight changes Cardiovascular: No chest pain, dyspnea on exertion, edema, orthopnea, palpitations, paroxysmal nocturnal dyspnea Dermatological: No rash, lesions or masses Respiratory: No cough, dyspnea Urologic: No hematuria, dysuria Abdominal: No nausea, vomiting, diarrhea, bright red blood per rectum, melena, or hematemesis Neurologic: No visual changes, weakness, changes in mental status All other systems reviewed and  are otherwise negative except as noted above.  Physical Exam Blood pressure 110/78, pulse 84, height 5\' 6"  (1.676 m), weight 223 lb (101.152 kg).  General: Well developed, well appearing 51 year old female in no acute distress. HEENT: Normocephalic, atraumatic. EOMs intact. Sclera nonicteric. Oropharynx clear.  Neck: Supple. No JVD. Lungs: Respirations regular and unlabored, CTA bilaterally. No wheezes, rales or rhonchi. Heart: RRR. S1, S2 present. No murmurs, rub, S3 or S4. Abdomen: Soft, non-distended.  Extremities: No clubbing, cyanosis or edema. DP/PT/Radials 2+ and equal bilaterally. Psych: Normal affect. Neuro: Alert and oriented X 3. Moves all extremities spontaneously.   Diagnostics Device  interrogation shows RV lead exit block with gradual rise in RV lead impedance, now >2000 ohms; RV lead threshold is high; atrial lead measurements are stable; shock impedance is stable; no programming changes made; see PaceArt report  Assessment and Plan 1. RV lead exit block - discussed/reviewed the indications for RV lead revision and the procedure involved, including risks and benefits; these risks include, but are not limited to, bleeding, infection, pneumothorax, pericardial perforation, tamponade, vascular damage, renal failure, lead dislodgement, MI, stroke and death; Ms. Maradiaga expressed verbal understanding and wishes to proceed with RV lead revision on Thursday, February 06, 2012. 2. VT s/p ICD implantation for secondary prevention of SCD 3. CAD - stable without anginal symptoms; followed by Dr. Verdis Prime  This plan of care was formulated with Dr. Lewayne Bunting via phone. Signed, Rick Duff, PA-C 02/04/2012, 4:29 PM

## 2012-02-06 NOTE — Interval H&P Note (Signed)
History and Physical Interval Note: Patient seen and examined, I agree with the history and physical exam as noted by [employer]. She has developed RV lead impedence and pacing threshold problems and presents for lead revision.   02/06/2012 1:31 PM  [name]  has presented today for surgery, with the diagnosis of Bad lead  The various methods of treatment have been discussed with the patient and family. After consideration of risks, benefits and other options for treatment, the patient has consented to  Procedure(s) (LRB): IMPLANTABLE CARDIOVERTER DEFIBRILLATOR REVISION (N/A) as a surgical intervention .  The patient's history has been reviewed, patient examined, no change in status, stable for surgery.  I have reviewed the patient's chart and labs.  Questions were answered to the patient's satisfaction.     Buel Ream.D.

## 2012-02-07 ENCOUNTER — Ambulatory Visit (HOSPITAL_COMMUNITY): Payer: BC Managed Care – PPO

## 2012-02-07 NOTE — Discharge Summary (Signed)
ELECTROPHYSIOLOGY DISCHARGE SUMMARY    Patient ID: [name],  MRN: [id number], DOB/AGE: Nov 13, 1960 51 y.o.  Admit date: 02/06/2012 Discharge date: 02/07/2012  Primary Care Physician: Herb Grays, MD Primary Cardiologist: Verdis Prime, MD Primary EP: Lewayne Bunting, MD  Primary Discharge Diagnosis:  1. RV lead failure s/p ICD RV lead revision 02/06/2012  Secondary Discharge Diagnoses:  1. Ischemic CM 2. Chronic systolic CHF 3. VT arrest s/p dual chamber ICD May 2011 4. CAD s/p MI, complicated by pseudoaneurysm s/p aneurysmectomy and CABG May 2011 5. History of ovarian cancer  Procedures This Admission:  1. Insertion of a new defibrillator lead, removal of the old defibrillator, and re-insertion of the native of the device after the new lead was placed and the twiddled atrial and ventricular leads were untwiddled. Also defibrillation threshold testing and ICD pocket revision. New lead - St. Jude Durata defibrillator lead model 7122Q 58 cm lead, serial number [id number].   History and Hospital Course:  [name] is a pleasant 51 year old woman with CAD s/p MI, complicated by pseudoaneurysm, s/p aneurysmectomy and CABG who had recurrent VT postoperatively with ICD placement while hospitalized in May 2011. She presented to the office on 02/04/2012 for device follow-up after a remote check alerted a problem with her RV lead. The RV lead impedance was >2000 ohms and her pacing threshold had increased. Upon further questioning, she admits that the device would often "turn over" in her chest. This has occurred since implant. She was scheduled for RV lead revision which was done yesterday 02/06/2012.  Please see procedure report for full details. The patient tolerated this procedure well without any immediate complication. She remains hemodynamically stable and afebrile. She has chronically low BP, in the 90-100 range systolic. Her chest xray shows stable lead placement without pneumothorax. Her  device interrogation shows normal ICD function with stable lead parameters/measurements. Her implant site is intact without significant bleeding or hematoma. She has been given discharge instructions including wound care and activity restrictions. She will follow-up in 10 days for wound check. There were no changes made to her medications. She has been seen, examined and deemed stable for discharge today by Dr. Berton Mount.  Physical Exam: Vitals: Blood pressure 88/60, pulse 80, temperature 97.8 F (36.6 C), temperature source Oral, resp. rate 22, height 5\' 6"  (1.676 m), weight 230 lb 13.2 oz (104.7 kg), SpO2 95.00%.  General: Well developed 51 year old female in no acute distress.  Heart: RRR. S1, S2 without murmur, rub or gallop  Lungs: CTA bilaterally. No wheezes, rales or rhonchi.  Extremities: No cyanosis, clubbing or edema.  Skin: Intact, warm and dry. Left upper chest/implant site intact without bleeding, drainage or hematoma.   Labs: Lab Results  Component Value Date   WBC 12.7* 02/04/2012   HGB 13.2 02/04/2012   HCT 39.8 02/04/2012   MCV 97.1 02/04/2012   PLT 142.0* 02/04/2012     Lab 02/04/12 1608  NA 133*  K 4.4  CL 98  CO2 25  BUN 12  CREATININE 1.2  CALCIUM 9.4  PROT --  BILITOT --  ALKPHOS --  ALT --  AST --  GLUCOSE 81    Disposition:  The patient is being discharged in stable condition.  Follow-up: Follow-up Information    Follow up with [facility] on 02/19/2012. (At 4:30 PM for wound check)    Contact information:   [address] [place] [address] [phone]  Follow up with Lewayne Bunting, MD on 05/26/2012. (At 11:15 AM)    Contact information:   [address] [place] [address] [phone]       Follow up with Lesleigh Noe, MD. (As scheduled)    Contact information:   [address] [place] [address] [place] [address] [phone]          Discharge Medications:  Medication List  As of 02/07/2012 10:10 AM   ASK your doctor about these medications         amiodarone 200 MG tablet   Commonly known as: PACERONE   Take 100 mg by mouth daily.      aspirin 325 MG EC tablet   Take 325 mg by mouth daily.      atorvastatin 40 MG tablet   Commonly known as: LIPITOR   Take 40 mg by mouth daily.      carvedilol 6.25 MG tablet   Commonly known as: COREG   Take 6.25 mg by mouth 2 (two) times daily with a meal.      EVENING PRIMROSE OIL PO   Take 1 capsule by mouth 2 (two) times daily.      ezetimibe 10 MG tablet   Commonly known as: ZETIA   Take 10 mg by mouth daily.      HYDROcodone-acetaminophen 5-325 MG per tablet   Commonly known as: NORCO/VICODIN   Take 1 tablet by mouth 2 (two) times daily as needed. Hip pain      KRILL OIL 1000 MG Caps   Take 2,000 mg by mouth daily.      levothyroxine 75 MCG tablet   Commonly known as: SYNTHROID, LEVOTHROID   Take 75 mcg by mouth daily.      multivitamin tablet   Take 1 tablet by mouth daily.      MULTIVITAMIN/IRON PO   Take 1 tablet by mouth daily.      OVER THE COUNTER MEDICATION   Take 2 tablets by mouth every 4 (four) hours as needed. [facility] BRAND TYLENOL SINUS, for congestion      simvastatin 40 MG tablet   Commonly known as: ZOCOR   Take 40 mg by mouth at bedtime.      spironolactone 25 MG tablet   Commonly known as: ALDACTONE   Take 25 mg by mouth 2 (two) times daily.      Vitamin D 1000 UNITS capsule   Take 1,000 Units by mouth 2 (two) times daily.          Duration of Discharge Encounter: Greater than 30 minutes including physician time.  Signed, Rick Duff, PA-C 02/07/2012, 10:10 AM   CXR >> lead withdrawal  Will need close followup  Also with history now clear that device was flipping over along the long axis this may recur with the [facility] Jude device

## 2012-02-07 NOTE — Op Note (Signed)
NAMELUDELLA, PRANGER NO.:  [id number]  MEDICAL RECORD NO.:  [id number]  LOCATION:  6526                         FACILITY:  [facility]  PHYSICIAN:  Doylene Canning. Ladona Ridgel, MD    DATE OF BIRTH:  1961-06-12  DATE OF PROCEDURE:  02/06/2012 DATE OF DISCHARGE:                              OPERATIVE REPORT   PROCEDURE PERFORMED:  Insertion of a new defibrillator lead, removal of the old defibrillator, and re-insertion of the native of the device after the new lead was placed and the twiddled atrial and ventricular leads were untwiddled.  Also defibrillation threshold testing and ICD pocket revision were carried out.  INTRODUCTION:  The patient is a 51 year old woman with an ischemic cardiomyopathy status post ICD implantation.  She was subsequently found to have progressive worsening of her RV pacing impedances and pacing thresholds.  She had suggestion of coiled leads on her chest x-ray.  She is now referred for insertion of a new defibrillator lead.  PROCEDURE:  After informed consent was obtained, the patient was taken to the diagnostic EP lab in a fasting state.  10 mL of IV contrast was injected into the left upper extremity venous system because it was very clear that we need to put a new defibrillator lead in place.  The vein was patent.  The patient was then prepped and draped in a sterile manner.  30 mL of lidocaine was infiltrated into the left infraclavicular region.  A 6 cm incision was carried out over this region.  Electrocautery was utilized to dissect down to the fascial plane.  The ICD pocket was entered and it was very apparent that the leads had wrapped upon themselves in a twiddled fashion.  The atrial and defibrillation leads were removed from the can, the can was placed in antibiotic irrigation and the atrial and ventricular leads were untangled.  The new St. Jude Durata defibrillator lead model 7122Q 58 cm lead, serial number [id number] was then advanced  into the right ventricle and mapping was carried out.  At the final site on the RV septum, the R- waves measured 15 multivitamin and the pacing impedance was 790 ohms. The threshold was 1 V at 0.5 msec.  With active fixation of the lead, there was a satisfactory injury current.  The old atrial lead was evaluated and found to be working satisfactorily.  P-waves were 2 and the pace impedance was 600 ohms, and threshold was 1 V at 0.5 msec.  The old defibrillator lead was capped.  The pocket was irrigated with antibiotic irrigation.  The pocket was revised to accommodate the additional defibrillator lead.  The previously implanted device was reconnected to the atrial and new defibrillation lead and placed back in the subcutaneous pocket.  The pocket was again irrigated with antibiotic irrigation and the incision was closed with 2-0 and 3-0 Vicryl.  Benzoin and Steri-Strips were painted on the skin.  At this point, I scrubbed out of the case, supervised defibrillation threshold testing.  After the patient was more deeply sedated with fentanyl and Versed, VF was induced with a T-wave shock.  A 20-joule shock was delivered, which terminated ventricular fibrillation and restored sinus rhythm.  At  this point, no additional defibrillation threshold testing was carried out and the incision closed with 2-0 and 3-0 Vicryl.  Benzoin and Steri-Strips had previously been placed.  Pressure dressing was then placed and the patient was returned to her room in satisfactory condition.  COMPLICATIONS:  There were no immediate procedure complications.  RESULTS:  Demonstrates successful ICD revision of a patient who had twiddled her ventricular lead such that it was no longer pacing with satisfactory impedances or thresholds.  A new lead was placed and the previously implanted device was reconnected and secured to the pocket.     Doylene Canning. Ladona Ridgel, MD     GWT/MEDQ  D:  02/06/2012  T:  02/07/2012  Job:   [id number]

## 2012-02-11 ENCOUNTER — Encounter: Payer: Self-pay | Admitting: *Deleted

## 2012-02-19 ENCOUNTER — Ambulatory Visit (INDEPENDENT_AMBULATORY_CARE_PROVIDER_SITE_OTHER): Payer: BC Managed Care – PPO | Admitting: *Deleted

## 2012-02-19 ENCOUNTER — Encounter: Payer: Self-pay | Admitting: Internal Medicine

## 2012-02-19 DIAGNOSIS — I472 Ventricular tachycardia: Secondary | ICD-10-CM

## 2012-02-19 LAB — ICD DEVICE OBSERVATION
AL THRESHOLD: 0.75 V
ATRIAL PACING ICD: 0 pct
DEV-0020ICD: NEGATIVE
FVT: 0
MODE SWITCH EPISODES: 0
TOT-0006: 20130816000000
TOT-0007: 1
TOT-0008: 0
TOT-0009: 2
TOT-0010: 9
TZAT-0013FASTVT: 3
TZAT-0018FASTVT: NEGATIVE
TZAT-0020FASTVT: 1 ms
TZON-0003FASTVT: 315 ms
TZON-0003SLOWVT: 400 ms
TZON-0004FASTVT: 30
TZON-0005FASTVT: 6
TZON-0005SLOWVT: 6
TZST-0001FASTVT: 2
TZST-0003FASTVT: 36 J
TZST-0003FASTVT: 40 J
VENTRICULAR PACING ICD: 0 pct
VF: 0

## 2012-02-19 NOTE — Progress Notes (Signed)
Wound check lead revision ICD

## 2012-03-24 ENCOUNTER — Other Ambulatory Visit: Payer: Self-pay | Admitting: Orthopedic Surgery

## 2012-03-24 MED ORDER — BUPIVACAINE LIPOSOME 1.3 % IJ SUSP: 20.0000 mL | Freq: Once | INTRAMUSCULAR | Status: DC

## 2012-03-24 MED ORDER — DEXAMETHASONE SODIUM PHOSPHATE 10 MG/ML IJ SOLN: 10.0000 mg | Freq: Once | INTRAMUSCULAR | Status: DC

## 2012-03-24 NOTE — Progress Notes (Signed)
Preoperative surgical orders have been place into the Epic hospital system for [name] on 03/24/2012, 8:51 PM  by Patrica Duel for surgery on 05/06/2012.  Preop Total Hip orders including Experel Injecion, IV Tylenol, and IV Decadron as long as there are no contraindications to the above medications. Stacy Peace, PA-C

## 2012-04-28 ENCOUNTER — Encounter (HOSPITAL_COMMUNITY): Payer: Self-pay | Admitting: Pharmacy Technician

## 2012-04-30 ENCOUNTER — Encounter (HOSPITAL_COMMUNITY)
Admission: RE | Admit: 2012-04-30 | Discharge: 2012-04-30 | Disposition: A | Discharge status: Home / Self Care | Payer: BC Managed Care – PPO | Source: Ambulatory Visit | Attending: Orthopedic Surgery | Admitting: Orthopedic Surgery

## 2012-04-30 ENCOUNTER — Ambulatory Visit (HOSPITAL_COMMUNITY)
Admission: RE | Admit: 2012-04-30 | Discharge: 2012-04-30 | Disposition: A | Discharge status: Home / Self Care | Payer: BC Managed Care – PPO | Source: Ambulatory Visit | Attending: Orthopedic Surgery | Admitting: Orthopedic Surgery

## 2012-04-30 ENCOUNTER — Encounter (HOSPITAL_COMMUNITY): Payer: Self-pay

## 2012-04-30 DIAGNOSIS — Z01818 Encounter for other preprocedural examination: Secondary | ICD-10-CM | POA: Insufficient documentation

## 2012-04-30 DIAGNOSIS — Z01812 Encounter for preprocedural laboratory examination: Secondary | ICD-10-CM | POA: Insufficient documentation

## 2012-04-30 DIAGNOSIS — M25559 Pain in unspecified hip: Secondary | ICD-10-CM | POA: Insufficient documentation

## 2012-04-30 LAB — URINALYSIS, ROUTINE W REFLEX MICROSCOPIC
Bilirubin Urine: NEGATIVE
Hgb urine dipstick: NEGATIVE
Nitrite: NEGATIVE
Specific Gravity, Urine: 1.015 (ref 1.005–1.030)
pH: 5.5 (ref 5.0–8.0)

## 2012-04-30 LAB — COMPREHENSIVE METABOLIC PANEL
AST: 18 U/L (ref 0–37)
Albumin: 4.1 g/dL (ref 3.5–5.2)
Alkaline Phosphatase: 73 U/L (ref 39–117)
Chloride: 99 mEq/L (ref 96–112)
Potassium: 4.5 mEq/L (ref 3.5–5.1)
Total Bilirubin: 0.2 mg/dL — ABNORMAL LOW (ref 0.3–1.2)

## 2012-04-30 LAB — SURGICAL PCR SCREEN: Staphylococcus aureus: NEGATIVE

## 2012-04-30 LAB — CBC
Platelets: 204 10*3/uL (ref 150–400)
RDW: 13.5 % (ref 11.5–15.5)
WBC: 12.1 10*3/uL — ABNORMAL HIGH (ref 4.0–10.5)

## 2012-04-30 LAB — APTT: aPTT: 31 seconds (ref 24–37)

## 2012-04-30 LAB — PROTIME-INR: INR: 0.94 (ref 0.00–1.49)

## 2012-04-30 NOTE — Patient Instructions (Signed)
[name]  04/30/2012                           YOUR PROCEDURE IS SCHEDULED ON:  05/06/12 AT 10:45 AM               PLEASE REPORT TO SHORT STAY CENTER AT :  8:15 AM               CALL THIS NUMBER IF ANY PROBLEMS THE DAY OF SURGERY :               [phone]                      REMEMBER:   Do not eat food or drink liquids AFTER MIDNIGHT   Take these medicines the morning of surgery with A SIP OF WATER:  AMIODARONE / CARVEDILOL / LEVOTHYROXINE / HYDROCODONE IF NEEDED   Do not wear jewelry, make-up   Do not wear lotions, powders, or perfumes.   Do not shave legs or underarms 12 hrs. before surgery (men may shave face)  Do not bring valuables to the hospital.  Contacts, dentures or bridgework may not be worn into surgery.  Leave suitcase in the car. After surgery it may be brought to your room.  For patients admitted to the hospital more than one night, checkout time is 11:00                          The day of discharge.   Patients discharged the day of surgery will not be allowed to drive home                             If going home same day of surgery, must have someone stay with you first                           24 hrs at home and arrange for some one to drive you home from hospital.    Special Instructions:   Please read over the following fact sheets that you were given:               1. MRSA  INFORMATION                      2.  PREPARING FOR SURGERY SHEET               3. INCENTIVE SPIROMETER                                              X_____________________________________________________________________

## 2012-05-06 ENCOUNTER — Ambulatory Visit (HOSPITAL_COMMUNITY): Payer: BC Managed Care – PPO | Admitting: Anesthesiology

## 2012-05-06 ENCOUNTER — Encounter (HOSPITAL_COMMUNITY)
Admission: RE | Disposition: A | Discharge status: Home Health | Payer: Self-pay | Source: Ambulatory Visit | Attending: Orthopedic Surgery

## 2012-05-06 ENCOUNTER — Other Ambulatory Visit: Payer: Self-pay | Admitting: Orthopedic Surgery

## 2012-05-06 ENCOUNTER — Encounter (HOSPITAL_COMMUNITY): Payer: Self-pay | Admitting: Anesthesiology

## 2012-05-06 ENCOUNTER — Inpatient Hospital Stay (HOSPITAL_COMMUNITY)
Admission: RE | Admit: 2012-05-06 | Discharge: 2012-05-08 | DRG: 818 | Disposition: A | Discharge status: Home Health | Payer: BC Managed Care – PPO | Source: Ambulatory Visit | Attending: Orthopedic Surgery | Admitting: Orthopedic Surgery

## 2012-05-06 ENCOUNTER — Ambulatory Visit (HOSPITAL_COMMUNITY): Payer: BC Managed Care – PPO

## 2012-05-06 ENCOUNTER — Encounter (HOSPITAL_COMMUNITY): Payer: Self-pay | Admitting: *Deleted

## 2012-05-06 ENCOUNTER — Encounter (HOSPITAL_COMMUNITY): Payer: Self-pay | Admitting: Orthopedic Surgery

## 2012-05-06 DIAGNOSIS — Z95810 Presence of automatic (implantable) cardiac defibrillator: Secondary | ICD-10-CM

## 2012-05-06 DIAGNOSIS — E669 Obesity, unspecified: Secondary | ICD-10-CM | POA: Diagnosis present

## 2012-05-06 DIAGNOSIS — E871 Hypo-osmolality and hyponatremia: Secondary | ICD-10-CM

## 2012-05-06 DIAGNOSIS — M1610 Unilateral primary osteoarthritis, unspecified hip: Principal | ICD-10-CM | POA: Diagnosis present

## 2012-05-06 DIAGNOSIS — I2510 Atherosclerotic heart disease of native coronary artery without angina pectoris: Secondary | ICD-10-CM | POA: Diagnosis present

## 2012-05-06 DIAGNOSIS — M169 Osteoarthritis of hip, unspecified: Secondary | ICD-10-CM | POA: Diagnosis present

## 2012-05-06 DIAGNOSIS — I252 Old myocardial infarction: Secondary | ICD-10-CM

## 2012-05-06 DIAGNOSIS — I10 Essential (primary) hypertension: Secondary | ICD-10-CM | POA: Diagnosis present

## 2012-05-06 DIAGNOSIS — D62 Acute posthemorrhagic anemia: Secondary | ICD-10-CM

## 2012-05-06 DIAGNOSIS — T82110A Breakdown (mechanical) of cardiac electrode, initial encounter: Secondary | ICD-10-CM

## 2012-05-06 DIAGNOSIS — I428 Other cardiomyopathies: Secondary | ICD-10-CM

## 2012-05-06 DIAGNOSIS — Z96649 Presence of unspecified artificial hip joint: Secondary | ICD-10-CM

## 2012-05-06 DIAGNOSIS — Z951 Presence of aortocoronary bypass graft: Secondary | ICD-10-CM

## 2012-05-06 DIAGNOSIS — Z6835 Body mass index (BMI) 35.0-35.9, adult: Secondary | ICD-10-CM

## 2012-05-06 DIAGNOSIS — I509 Heart failure, unspecified: Secondary | ICD-10-CM | POA: Diagnosis present

## 2012-05-06 DIAGNOSIS — I472 Ventricular tachycardia: Secondary | ICD-10-CM

## 2012-05-06 DIAGNOSIS — E039 Hypothyroidism, unspecified: Secondary | ICD-10-CM | POA: Diagnosis present

## 2012-05-06 HISTORY — PX: TOTAL HIP ARTHROPLASTY: SHX124

## 2012-05-06 LAB — TYPE AND SCREEN: Antibody Screen: NEGATIVE

## 2012-05-06 SURGERY — ARTHROPLASTY, HIP, TOTAL,POSTERIOR APPROACH
Anesthesia: General | Site: Hip | Laterality: Right | Wound class: Clean

## 2012-05-06 MED ORDER — DOCUSATE SODIUM 100 MG PO CAPS
100.0000 mg | ORAL_CAPSULE | Freq: Two times a day (BID) | ORAL | Status: DC
  Administered 2012-05-06 – 2012-05-08 (×4): 100 mg via ORAL

## 2012-05-06 MED ORDER — ACETAMINOPHEN 10 MG/ML IV SOLN
INTRAVENOUS | Status: DC | PRN
  Administered 2012-05-06: 1000 mg via INTRAVENOUS

## 2012-05-06 MED ORDER — HYDROMORPHONE HCL PF 1 MG/ML IJ SOLN
INTRAMUSCULAR | Status: DC | PRN
  Administered 2012-05-06: 1 mg via INTRAVENOUS
  Administered 2012-05-06 (×2): 0.5 mg via INTRAVENOUS

## 2012-05-06 MED ORDER — ROCURONIUM BROMIDE 100 MG/10ML IV SOLN
INTRAVENOUS | Status: DC | PRN
  Administered 2012-05-06: 40 mg via INTRAVENOUS

## 2012-05-06 MED ORDER — OXYCODONE HCL 5 MG PO TABS
15.0000 mg | ORAL_TABLET | ORAL | Status: DC | PRN
  Administered 2012-05-07: 15 mg via ORAL
  Administered 2012-05-07: 10 mg via ORAL
  Administered 2012-05-07 (×2): 15 mg via ORAL
  Filled 2012-05-06 (×4): qty 3

## 2012-05-06 MED ORDER — ACETAMINOPHEN 325 MG PO TABS
650.0000 mg | ORAL_TABLET | Freq: Four times a day (QID) | ORAL | Status: DC | PRN
  Administered 2012-05-07: 650 mg via ORAL
  Filled 2012-05-06: qty 2

## 2012-05-06 MED ORDER — CEFAZOLIN SODIUM-DEXTROSE 2-3 GM-% IV SOLR
2.0000 g | Freq: Four times a day (QID) | INTRAVENOUS | Status: AC
  Administered 2012-05-06 (×2): 2 g via INTRAVENOUS
  Filled 2012-05-06 (×2): qty 50

## 2012-05-06 MED ORDER — EPHEDRINE SULFATE 50 MG/ML IJ SOLN
INTRAMUSCULAR | Status: DC | PRN
  Administered 2012-05-06 (×2): 5 mg via INTRAVENOUS

## 2012-05-06 MED ORDER — ACETAMINOPHEN 10 MG/ML IV SOLN: 1000.0000 mg | Freq: Once | INTRAVENOUS | Status: DC

## 2012-05-06 MED ORDER — PHENOL 1.4 % MT LIQD
1.0000 | OROMUCOSAL | Status: DC | PRN
  Filled 2012-05-06: qty 177

## 2012-05-06 MED ORDER — HYDROMORPHONE HCL PF 1 MG/ML IJ SOLN
0.2500 mg | INTRAMUSCULAR | Status: DC | PRN
  Administered 2012-05-06 (×3): 0.5 mg via INTRAVENOUS

## 2012-05-06 MED ORDER — CEFAZOLIN SODIUM-DEXTROSE 2-3 GM-% IV SOLR
2.0000 g | INTRAVENOUS | Status: AC
  Administered 2012-05-06: 2 g via INTRAVENOUS

## 2012-05-06 MED ORDER — NEOSTIGMINE METHYLSULFATE 1 MG/ML IJ SOLN
INTRAMUSCULAR | Status: DC | PRN
  Administered 2012-05-06: 4 mg via INTRAVENOUS

## 2012-05-06 MED ORDER — HYDROMORPHONE HCL PF 1 MG/ML IJ SOLN
1.0000 mg | INTRAMUSCULAR | Status: DC | PRN
  Administered 2012-05-06 (×2): 1 mg via INTRAVENOUS
  Filled 2012-05-06 (×2): qty 1

## 2012-05-06 MED ORDER — CEFAZOLIN SODIUM-DEXTROSE 2-3 GM-% IV SOLR
INTRAVENOUS | Status: AC
  Filled 2012-05-06: qty 50

## 2012-05-06 MED ORDER — METHOCARBAMOL 100 MG/ML IJ SOLN
500.0000 mg | Freq: Four times a day (QID) | INTRAVENOUS | Status: DC | PRN
  Administered 2012-05-06 (×2): 500 mg via INTRAVENOUS
  Filled 2012-05-06 (×2): qty 5

## 2012-05-06 MED ORDER — DEXAMETHASONE 4 MG PO TABS
10.0000 mg | ORAL_TABLET | Freq: Once | ORAL | Status: AC
  Administered 2012-05-07: 10 mg via ORAL
  Filled 2012-05-06: qty 2.5

## 2012-05-06 MED ORDER — BISACODYL 10 MG RE SUPP: 10.0000 mg | Freq: Every day | RECTAL | Status: DC | PRN

## 2012-05-06 MED ORDER — BUPIVACAINE LIPOSOME 1.3 % IJ SUSP
20.0000 mL | INTRAMUSCULAR | Status: AC
  Administered 2012-05-06: 70 mL
  Filled 2012-05-06: qty 20

## 2012-05-06 MED ORDER — MENTHOL 3 MG MT LOZG
1.0000 | LOZENGE | OROMUCOSAL | Status: DC | PRN
  Filled 2012-05-06: qty 9

## 2012-05-06 MED ORDER — CARVEDILOL 12.5 MG PO TABS
12.5000 mg | ORAL_TABLET | Freq: Two times a day (BID) | ORAL | Status: DC
  Administered 2012-05-06 – 2012-05-08 (×4): 12.5 mg via ORAL
  Filled 2012-05-06 (×6): qty 1

## 2012-05-06 MED ORDER — DEXTROSE-NACL 5-0.9 % IV SOLN
INTRAVENOUS | Status: DC
  Administered 2012-05-06 – 2012-05-07 (×2): via INTRAVENOUS

## 2012-05-06 MED ORDER — SODIUM CHLORIDE 0.9 % IV SOLN
INTRAVENOUS | Status: DC
Start: 2012-05-06 — End: 2012-05-06

## 2012-05-06 MED ORDER — DEXAMETHASONE 6 MG PO TABS
10.0000 mg | ORAL_TABLET | Freq: Once | ORAL | Status: DC
  Filled 2012-05-06: qty 1

## 2012-05-06 MED ORDER — POLYETHYLENE GLYCOL 3350 17 G PO PACK: 17.0000 g | PACK | Freq: Every day | ORAL | Status: DC | PRN

## 2012-05-06 MED ORDER — PROPOFOL 10 MG/ML IV BOLUS
INTRAVENOUS | Status: DC | PRN
  Administered 2012-05-06: 200 mg via INTRAVENOUS

## 2012-05-06 MED ORDER — HYDROMORPHONE HCL PF 1 MG/ML IJ SOLN
INTRAMUSCULAR | Status: AC
  Filled 2012-05-06: qty 1

## 2012-05-06 MED ORDER — 0.9 % SODIUM CHLORIDE (POUR BTL) OPTIME
TOPICAL | Status: DC | PRN
  Administered 2012-05-06: 1000 mL

## 2012-05-06 MED ORDER — TRAMADOL HCL 50 MG PO TABS: 50.0000 mg | ORAL_TABLET | Freq: Four times a day (QID) | ORAL | Status: DC | PRN

## 2012-05-06 MED ORDER — AMIODARONE HCL 100 MG PO TABS
100.0000 mg | ORAL_TABLET | Freq: Every morning | ORAL | Status: DC
  Administered 2012-05-07 – 2012-05-08 (×2): 100 mg via ORAL
  Filled 2012-05-06 (×2): qty 1

## 2012-05-06 MED ORDER — CARVEDILOL 12.5 MG PO TABS
12.5000 mg | ORAL_TABLET | Freq: Once | ORAL | Status: AC
  Administered 2012-05-06: 12.5 mg via ORAL
  Filled 2012-05-06: qty 1

## 2012-05-06 MED ORDER — FLEET ENEMA 7-19 GM/118ML RE ENEM: 1.0000 | ENEMA | Freq: Once | RECTAL | Status: AC | PRN

## 2012-05-06 MED ORDER — LEVOTHYROXINE SODIUM 75 MCG PO TABS
75.0000 ug | ORAL_TABLET | Freq: Every day | ORAL | Status: DC
  Administered 2012-05-07 – 2012-05-08 (×2): 75 ug via ORAL
  Filled 2012-05-06 (×3): qty 1

## 2012-05-06 MED ORDER — EZETIMIBE 10 MG PO TABS
10.0000 mg | ORAL_TABLET | Freq: Every evening | ORAL | Status: DC
  Administered 2012-05-06 – 2012-05-07 (×2): 10 mg via ORAL
  Filled 2012-05-06 (×3): qty 1

## 2012-05-06 MED ORDER — MIDAZOLAM HCL 5 MG/5ML IJ SOLN
INTRAMUSCULAR | Status: DC | PRN
  Administered 2012-05-06: 2 mg via INTRAVENOUS

## 2012-05-06 MED ORDER — ACETAMINOPHEN 10 MG/ML IV SOLN
1000.0000 mg | Freq: Four times a day (QID) | INTRAVENOUS | Status: AC
  Administered 2012-05-06 – 2012-05-07 (×3): 1000 mg via INTRAVENOUS
  Filled 2012-05-06 (×6): qty 100

## 2012-05-06 MED ORDER — ACETAMINOPHEN 10 MG/ML IV SOLN
INTRAVENOUS | Status: AC
  Filled 2012-05-06: qty 100

## 2012-05-06 MED ORDER — OXYCODONE HCL 5 MG PO TABS
5.0000 mg | ORAL_TABLET | ORAL | Status: DC | PRN
Start: 2012-05-06 — End: 2012-05-06
  Administered 2012-05-06 (×3): 10 mg via ORAL
  Filled 2012-05-06 (×3): qty 2

## 2012-05-06 MED ORDER — METOCLOPRAMIDE HCL 10 MG PO TABS
5.0000 mg | ORAL_TABLET | Freq: Three times a day (TID) | ORAL | Status: DC | PRN
  Filled 2012-05-06: qty 1

## 2012-05-06 MED ORDER — SODIUM CHLORIDE 0.9 % IJ SOLN
INTRAMUSCULAR | Status: DC | PRN
  Administered 2012-05-06: 50 mL via INTRAVENOUS

## 2012-05-06 MED ORDER — LACTATED RINGERS IV SOLN
INTRAVENOUS | Status: DC | PRN
  Administered 2012-05-06 (×2): via INTRAVENOUS

## 2012-05-06 MED ORDER — ACETAMINOPHEN 650 MG RE SUPP: 650.0000 mg | Freq: Four times a day (QID) | RECTAL | Status: DC | PRN

## 2012-05-06 MED ORDER — GLYCOPYRROLATE 0.2 MG/ML IJ SOLN
INTRAMUSCULAR | Status: DC | PRN
  Administered 2012-05-06: .7 mg via INTRAVENOUS

## 2012-05-06 MED ORDER — RIVAROXABAN 10 MG PO TABS
10.0000 mg | ORAL_TABLET | Freq: Every day | ORAL | Status: DC
  Administered 2012-05-07 – 2012-05-08 (×2): 10 mg via ORAL
  Filled 2012-05-06 (×3): qty 1

## 2012-05-06 MED ORDER — MORPHINE SULFATE 2 MG/ML IJ SOLN
1.0000 mg | INTRAMUSCULAR | Status: DC | PRN
  Administered 2012-05-06: 2 mg via INTRAVENOUS
  Administered 2012-05-06: 1 mg via INTRAVENOUS
  Administered 2012-05-06 (×2): 2 mg via INTRAVENOUS
  Filled 2012-05-06 (×4): qty 1

## 2012-05-06 MED ORDER — METHOCARBAMOL 500 MG PO TABS
500.0000 mg | ORAL_TABLET | Freq: Four times a day (QID) | ORAL | Status: DC | PRN
  Administered 2012-05-07 – 2012-05-08 (×3): 500 mg via ORAL
  Filled 2012-05-06 (×5): qty 1

## 2012-05-06 MED ORDER — CHLORHEXIDINE GLUCONATE 4 % EX LIQD
60.0000 mL | Freq: Once | CUTANEOUS | Status: DC
  Filled 2012-05-06: qty 60

## 2012-05-06 MED ORDER — ONDANSETRON HCL 4 MG PO TABS: 4.0000 mg | ORAL_TABLET | Freq: Four times a day (QID) | ORAL | Status: DC | PRN

## 2012-05-06 MED ORDER — LACTATED RINGERS IV SOLN: INTRAVENOUS | Status: DC

## 2012-05-06 MED ORDER — ONDANSETRON HCL 4 MG/2ML IJ SOLN
4.0000 mg | Freq: Four times a day (QID) | INTRAMUSCULAR | Status: DC | PRN
  Administered 2012-05-07: 4 mg via INTRAVENOUS
  Filled 2012-05-06: qty 2

## 2012-05-06 MED ORDER — FENTANYL CITRATE 0.05 MG/ML IJ SOLN
INTRAMUSCULAR | Status: DC | PRN
  Administered 2012-05-06: 25 ug via INTRAVENOUS
  Administered 2012-05-06: 50 ug via INTRAVENOUS
  Administered 2012-05-06: 25 ug via INTRAVENOUS
  Administered 2012-05-06: 100 ug via INTRAVENOUS
  Administered 2012-05-06: 50 ug via INTRAVENOUS

## 2012-05-06 MED ORDER — DEXAMETHASONE SODIUM PHOSPHATE 10 MG/ML IJ SOLN
10.0000 mg | Freq: Once | INTRAMUSCULAR | Status: AC
  Administered 2012-05-07: 10 mg via INTRAVENOUS
  Filled 2012-05-06: qty 1

## 2012-05-06 MED ORDER — ATORVASTATIN CALCIUM 40 MG PO TABS
40.0000 mg | ORAL_TABLET | Freq: Every evening | ORAL | Status: DC
  Administered 2012-05-06 – 2012-05-07 (×2): 40 mg via ORAL
  Filled 2012-05-06 (×3): qty 1

## 2012-05-06 MED ORDER — SPIRONOLACTONE 25 MG PO TABS
25.0000 mg | ORAL_TABLET | Freq: Two times a day (BID) | ORAL | Status: DC
  Administered 2012-05-06 – 2012-05-08 (×4): 25 mg via ORAL
  Filled 2012-05-06 (×5): qty 1

## 2012-05-06 MED ORDER — DIPHENHYDRAMINE HCL 12.5 MG/5ML PO ELIX: 12.5000 mg | ORAL_SOLUTION | ORAL | Status: DC | PRN

## 2012-05-06 MED ORDER — METOCLOPRAMIDE HCL 5 MG/ML IJ SOLN
5.0000 mg | Freq: Three times a day (TID) | INTRAMUSCULAR | Status: DC | PRN
  Administered 2012-05-07: 10 mg via INTRAVENOUS
  Filled 2012-05-06 (×2): qty 2

## 2012-05-06 SURGICAL SUPPLY — 53 items
BAG SPEC THK2 15X12 ZIP CLS (MISCELLANEOUS) ×1
BAG ZIPLOCK 12X15 (MISCELLANEOUS) ×2 IMPLANT
BIT DRILL 2.8X128 (BIT) ×2 IMPLANT
BLADE EXTENDED COATED 6.5IN (ELECTRODE) ×2 IMPLANT
BLADE SAW SAG 73X25 THK (BLADE) ×1
BLADE SAW SGTL 73X25 THK (BLADE) ×1 IMPLANT
CLOTH BEACON ORANGE TIMEOUT ST (SAFETY) ×2 IMPLANT
DECANTER SPIKE VIAL GLASS SM (MISCELLANEOUS) ×2 IMPLANT
DRAPE INCISE IOBAN 66X45 STRL (DRAPES) ×2 IMPLANT
DRAPE ORTHO SPLIT 77X108 STRL (DRAPES) ×4
DRAPE POUCH INSTRU U-SHP 10X18 (DRAPES) ×2 IMPLANT
DRAPE SURG ORHT 6 SPLT 77X108 (DRAPES) ×2 IMPLANT
DRAPE U-SHAPE 47X51 STRL (DRAPES) ×2 IMPLANT
DRSG ADAPTIC 3X8 NADH LF (GAUZE/BANDAGES/DRESSINGS) ×2 IMPLANT
DRSG EMULSION OIL 3X16 NADH (GAUZE/BANDAGES/DRESSINGS) ×1 IMPLANT
DRSG MEPILEX BORDER 4X4 (GAUZE/BANDAGES/DRESSINGS) ×2 IMPLANT
DRSG MEPILEX BORDER 4X8 (GAUZE/BANDAGES/DRESSINGS) ×2 IMPLANT
DURAPREP 26ML APPLICATOR (WOUND CARE) ×2 IMPLANT
ELECT REM PT RETURN 9FT ADLT (ELECTROSURGICAL) ×2
ELECTRODE REM PT RTRN 9FT ADLT (ELECTROSURGICAL) ×1 IMPLANT
EVACUATOR 1/8 PVC DRAIN (DRAIN) ×2 IMPLANT
FACESHIELD LNG OPTICON STERILE (SAFETY) ×8 IMPLANT
GLOVE BIO SURGEON STRL SZ8 (GLOVE) ×1 IMPLANT
GLOVE BIOGEL PI IND STRL 8 (GLOVE) ×2 IMPLANT
GLOVE BIOGEL PI INDICATOR 8 (GLOVE) ×2
GLOVE ECLIPSE 8.0 STRL XLNG CF (GLOVE) ×1 IMPLANT
GLOVE SURG SS PI 6.5 STRL IVOR (GLOVE) ×4 IMPLANT
GLOVE SURG SS PI 7.5 STRL IVOR (GLOVE) ×2 IMPLANT
GLOVE SURG SS PI 8.0 STRL IVOR (GLOVE) ×2 IMPLANT
GOWN STRL NON-REIN LRG LVL3 (GOWN DISPOSABLE) ×4 IMPLANT
GOWN STRL REIN XL XLG (GOWN DISPOSABLE) ×2 IMPLANT
IMMOBILIZER KNEE 20 (SOFTGOODS)
IMMOBILIZER KNEE 20 THIGH 36 (SOFTGOODS) IMPLANT
KIT BASIN OR (CUSTOM PROCEDURE TRAY) ×2 IMPLANT
MANIFOLD NEPTUNE II (INSTRUMENTS) ×2 IMPLANT
NDL SAFETY ECLIPSE 18X1.5 (NEEDLE) ×1 IMPLANT
NEEDLE HYPO 18GX1.5 SHARP (NEEDLE) ×2
NS IRRIG 1000ML POUR BTL (IV SOLUTION) ×2 IMPLANT
PACK TOTAL JOINT (CUSTOM PROCEDURE TRAY) ×2 IMPLANT
PASSER SUT SWANSON 36MM LOOP (INSTRUMENTS) ×2 IMPLANT
POSITIONER SURGICAL ARM (MISCELLANEOUS) ×2 IMPLANT
SPONGE GAUZE 4X4 12PLY (GAUZE/BANDAGES/DRESSINGS) ×2 IMPLANT
STRIP CLOSURE SKIN 1/2X4 (GAUZE/BANDAGES/DRESSINGS) ×4 IMPLANT
SUT ETHIBOND NAB CT1 #1 30IN (SUTURE) ×4 IMPLANT
SUT MNCRL AB 4-0 PS2 18 (SUTURE) ×2 IMPLANT
SUT VIC AB 2-0 CT1 27 (SUTURE) ×6
SUT VIC AB 2-0 CT1 TAPERPNT 27 (SUTURE) ×3 IMPLANT
SUT VLOC 180 0 24IN GS25 (SUTURE) ×4 IMPLANT
SYR 50ML LL SCALE MARK (SYRINGE) ×2 IMPLANT
TOWEL OR 17X26 10 PK STRL BLUE (TOWEL DISPOSABLE) ×4 IMPLANT
TOWEL OR NON WOVEN STRL DISP B (DISPOSABLE) ×2 IMPLANT
TRAY FOLEY CATH 14FRSI W/METER (CATHETERS) ×2 IMPLANT
WATER STERILE IRR 1500ML POUR (IV SOLUTION) ×3 IMPLANT

## 2012-05-06 NOTE — Progress Notes (Signed)
Utilization review completed.  

## 2012-05-06 NOTE — Interval H&P Note (Signed)
History and Physical Interval Note:  05/06/2012 9:39 AM  [name]  has presented today for surgery, with the diagnosis of osteoarthritis right hip  The various methods of treatment have been discussed with the patient and family. After consideration of risks, benefits and other options for treatment, the patient has consented to  Procedure(s) (LRB) with comments: TOTAL HIP ARTHROPLASTY (Right) as a surgical intervention .  The patient's history has been reviewed, patient examined, no change in status, stable for surgery.  I have reviewed the patient's chart and labs.  Questions were answered to the patient's satisfaction.     Loanne Drilling

## 2012-05-06 NOTE — Progress Notes (Signed)
PT Cancellation Note  Patient Details Name: [name] MRN: [id number] DOB: [birth year]   Cancelled Treatment:    Reason Eval/Treat Not Completed: Pain limiting ability to participate  Pt prefers to start therapy tomorrow due to just receiving morphine for pain.   Jorey Dollard,KATHrine E 05/06/2012, 3:55 PM Pager: [phone]

## 2012-05-06 NOTE — Anesthesia Postprocedure Evaluation (Signed)
  Anesthesia Post-op Note  Patient: [name]  Procedure(s) Performed: Procedure(s) (LRB): TOTAL HIP ARTHROPLASTY (Right)  Patient Location: PACU  Anesthesia Type: General  Level of Consciousness: awake and alert   Airway and Oxygen Therapy: Patient Spontanous Breathing  Post-op Pain: mild  Post-op Assessment: Post-op Vital signs reviewed, Patient's Cardiovascular Status Stable, Respiratory Function Stable, Patent Airway and No signs of Nausea or vomiting  Post-op Vital Signs: stable  Complications: No apparent anesthesia complications

## 2012-05-06 NOTE — Transfer of Care (Signed)
Immediate Anesthesia Transfer of Care Note  Patient: [name]  Procedure(s) Performed: Procedure(s) (LRB): TOTAL HIP ARTHROPLASTY (Right)  Patient Location: PACU  Anesthesia Type: General  Level of Consciousness: sedated, patient cooperative and responds to stimulaton  Airway & Oxygen Therapy: Patient Spontanous Breathing and Patient connected to face mask oxgen  Post-op Assessment: Report given to PACU RN and Post -op Vital signs reviewed and stable  Post vital signs: Reviewed and stable  Complications: No apparent anesthesia complications

## 2012-05-06 NOTE — Op Note (Signed)
Pre-operative diagnosis- Osteoarthritis Right hip  Post-operative diagnosis- Osteoarthritis  Right hip  Procedure-  RightTotal Hip Arthroplasty  Surgeon- Gus Rankin. Adith Tejada, MD  Assistant- Leilani Able, PA-C   Anesthesia  General  EBL- 300   Drain Hemovac   Complication- None  Condition-PACU - hemodynamically stable.   Brief Clinical Note-  [name] is a 51 y.o. female with end stage arthritis of her right hip with progressively worsening pain and dysfunction. Pain occurs with activity and rest including pain at night. She has tried analgesics, protected weight bearing and rest without benefit. Pain is too severe to attempt physical therapy. Radiographs demonstrate bone on bone arthritis with subchondral cyst formation. She presents now for right THA.  Procedure in detail-   The patient is brought into the operating room and placed on the operating table. After successful administration of General  anesthesia, the patient is placed in the  Left lateral decubitus position with the  Right side up and held in place with the hip positioner. The lower extremity is isolated from the perineum with plastic drapes and time-out is performed by the surgical team. The lower extremity is then prepped and draped in the usual sterile fashion. A short posterolateral incision is made with a ten blade through the subcutaneous tissue to the level of the fascia lata which is incised in line with the skin incision. The sciatic nerve is palpated and protected and the short external rotators and capsule are isolated from the femur. The hip is then dislocated and the center of the femoral head is marked. A trial prosthesis is placed such that the trial head corresponds to the center of the patients' native femoral head. The resection level is marked on the femoral neck and the resection is made with an oscillating saw. The femoral head is removed and femoral retractors placed to gain access to the femoral  canal.      The canal finder is passed into the femoral canal and the canal is thoroughly irrigated with sterile saline to remove the fatty contents. Axial reaming is performed to 13.5  mm, proximal reaming to 81F  and the sleeve machined to a large. A 18 F large trial sleeve is placed into the proximal femur.      The femur is then retracted anteriorly to gain acetabular exposure. Acetabular retractors are placed and the labrum and osteophytes are removed, Acetabular reaming is performed to 49  mm and a 50 mm Pinnacle acetabular shell is placed in anatomic position with excellent purchase. Additional dome screws were not needed.  The permanent 32 mm neutral + 4 Marathon liner is placed into the acetabular shell.      The trial femur is then placed into the femoral canal. The size is 18 x 13  stem with a 36 + 12  neck and a 32 + 3 head with the neck version matching  the patients' native anteversion. The hip is reduced with excellent stability with full extension and full external rotation, 70 degrees flexion with 40 degrees adduction and 90 degrees internal rotation and 90 degrees of flexion with 70 degrees of internal rotation. The operative leg is placed on top of the non-operative leg and the leg lengths are found to be equal. The trials are then removed and the permanent implant of the same size is impacted into the femoral canal. The ceramic femoral head of the same size as the trial is placed and the hip is reduced with the same stability  parameters. The operative leg is again placed on top of the non-operative leg and the leg lengths are found to be equal.      The wound is then copiously irrigated with saline solution and the capsule and short external rotators are re-attached to the femur through drill holes with Ethibond suture. The fascia lata is closed over a hemovac drain with #1 vicryl suture and the fascia lata, gluteal muscles and subcutaneous tissues are injected with Exparel 20ml diluted with  saline 50ml. The subcutaneous tissues are closed with #1 and2-0 vicryl and the subcuticular layer closed with running 4-0 Monocryl. The drain is hooked to suction, incision cleaned and dried, and steri-srips and a bulky sterile dressing applied. The limb is placed into a knee immobilizer and the patient is awakened and transported to recovery in stable condition.      Please note that a surgical assistant was a medical necessity for this procedure in order to perform it in a safe and expeditious manner. The assistant was necessary to provide retraction to the vital neurovascular structures and to retract and position the limb to allow for anatomic placement of the prosthetic components.  Gus Rankin Stacy Petrak, MD    05/06/2012, 12:06 PM

## 2012-05-06 NOTE — H&P (View-Only) (Signed)
[name]  DOB: 09/18/1960 Divorced / Language: English / Race: White Female  Date of Admission:  05/06/12  Chief Complaint:  Right Hip pain  History of Present Illness The patient is a 51 year old female who comes in  for a preoperative History and Physical. The patient is scheduled for a right total hip arthroplasty to be performed by Dr. Gus Rankin. Aluisio, MD at [facility] on 05/06/2012. The patient is a 51 year old female who presents today for follow up of their hip. The patient is being followed for their right hip pain and osteoarthritis. They are 3 week(s) out from intra articular injection with Dr. Ethelene Hal. Note for "Follow-up Hip": She said the injection only helped for about 10 days. She said the pain is back to the level it was before. The pain in the hip is hurting at all times. It is limiting what she can and cannot do. She has had more limitations in motion. It has been going on for quite a while now. It is just getting progressively worse over time. She is at a stage where she cannot tolerate it anymore. She was hoping that the intraarticular injection would have been more beneficial, but unfortunately did not provide much benefit. Se is now ready to get the hip fixed. They have been treated conservatively in the past for the above stated problem and despite conservative measures, they continue to have progressive pain and severe functional limitations and dysfunction. They have failed non-operative management including home exercise, medications, and injections. It is felt that they would benefit from undergoing total joint replacement. Risks and benefits of the procedure have been discussed with the patient and they elect to proceed with surgery. There are no active contraindications to surgery such as ongoing infection or rapidly progressive neurological disease.      Problem List Osteoarthritis, Hip (715.35)   Allergies Compazine  *ANTIPSYCHOTICS/ANTIMANIC AGENTS* Adhesive Tape *MEDICAL DEVICES*. Skin irritation - CAN USE PAPER TAPE   Family History Congestive Heart Failure. father Heart Disease. mother and father Hypertension. father Osteoarthritis. mother Osteoporosis. mother   Social History Exercise. Exercises rarely; does other Illicit drug use. no Living situation. live with partner Drug/Alcohol Rehab (Previously). no Children. 2 Current work status. working full time Drug/Alcohol Rehab (Currently). no Marital status. divorced Tobacco use. former smoker; smoke(d) 1 1/2 pack(s) per day Number of flights of stairs before winded. 1 Pain Contract. no Tobacco / smoke exposure. no Alcohol use. current drinker; drinks beer; only occasionally per week Post-Surgical Plans. Plan for home Advance Directives. Living Will, Healthcare POA   Medication History Atorvastatin Calcium (40MG  Tablet, Oral) Active. Vitamin D ( Oral) Specific dose unknown - Active. Spironolactone (25MG  Tablet, Oral) Active. Evening Primrose Oil (1000MG  Capsule, Oral) Active. Multiple Vitamin ( Oral) Active. Omega 3 ( Oral) Specific dose unknown - Active. Amiodarone HCl (200MG  Tablet, Oral) Active. Zetia (10MG  Tablet, Oral) Active. Bayer Aspirin (325MG  Tablet, Oral) Active. Carvedilol (12.5MG  Tablet, Oral) Active. Levothyroxine Sodium ( Tablet, Oral) Active. Aloe Vera ( Oral) Active.   Past Surgical History Tubal Ligation. Date: 21. Appendectomy. Date: 80. Hysterectomy. Date: 2001. partial (non-cancerous) Tonsillectomy. Mid 1960's Oophorectomy; Bilateral. Date: 2002. Mitral Valve Replacement. Date: 09/2009. Pacemaker with Defribillator. Date: 10/2009.   Medical history Myocardial infarction. Date: 09/2009. Osteoarthritis Hypothyroidism Cancer. Bladder Congestive Heart Failure. Chronic Systolic Valvular heart disease. Mitral Valve Ventricular Tachycardia (Post  MI) Hyperlipidemia Coronary Artery Disease/Heart Disease   Review of Systems General:Not Present- Chills, Fever, Night Sweats, Fatigue, Weight  Gain, Weight Loss and Memory Loss. Skin:Not Present- Hives, Itching, Rash, Eczema and Lesions. HEENT:Not Present- Tinnitus, Headache, Double Vision, Visual Loss, Hearing Loss and Dentures. Respiratory:Not Present- Shortness of breath with exertion, Shortness of breath at rest, Allergies, Coughing up blood and Chronic Cough. Cardiovascular:Not Present- Chest Pain, Racing/skipping heartbeats, Difficulty Breathing Lying Down, Murmur, Swelling and Palpitations. Gastrointestinal:Not Present- Bloody Stool, Heartburn, Abdominal Pain, Vomiting, Nausea, Constipation, Diarrhea, Difficulty Swallowing, Jaundice and Loss of appetitie. Female Genitourinary:Not Present- Blood in Urine, Urinary frequency, Weak urinary stream, Discharge, Flank Pain, Incontinence, Painful Urination, Urgency, Urinary Retention and Urinating at Night. Musculoskeletal:Present- Muscle Pain and Joint Pain. Not Present- Muscle Weakness, Joint Swelling, Back Pain, Morning Stiffness and Spasms. Neurological:Not Present- Tremor, Dizziness, Blackout spells, Paralysis, Difficulty with balance and Weakness. Psychiatric:Not Present- Insomnia.   Vitals Weight: 217 lb Height: 66 in Body Surface Area: 2.14 m Body Mass Index: 35.02 kg/m Pulse: 64 (Regular) Resp.: 12 (Unlabored) BP: 102/68 (Sitting, Left Arm, Standard)    Physical Exam The physical exam findings are as follows:  Note: Patient is a 51 year old female with continued hip pain.   General Mental Status - Alert, cooperative and good historian. General Appearance- pleasant. Not in acute distress. Orientation- Oriented X3. Build & Nutrition- Well nourished and Well developed.   Head and Neck Head- normocephalic, atraumatic . Neck Global Assessment- supple. no bruit auscultated on the right and  no bruit auscultated on the left.   Eye Pupil- Bilateral- Regular and Round. Motion- Bilateral- EOMI.   Chest and Lung Exam Inspection: Chest Wall:Inspection of the chest wall reveals - Note: Defribillator/Pacemaker in left upper chest wall Auscultation: Breath sounds:- clear at anterior chest wall and - clear at posterior chest wall. Adventitious sounds:- No Adventitious sounds.   Cardiovascular Auscultation:Rhythm- Regular rate and rhythm. Heart Sounds- S1 WNL and S2 WNL. Murmurs & Other Heart Sounds:Auscultation of the heart reveals - No Murmurs.   Abdomen Palpation/Percussion:Tenderness- Abdomen is non-tender to palpation. Rigidity (guarding)- Abdomen is soft. Auscultation:Auscultation of the abdomen reveals - Bowel sounds normal.   Female Genitourinary   Note: Not done, not pertinent to present illness  Musculoskeletal   Note: She has full extension, flexion to 115 with pain, external rotation of 30 degrees with pain, and internal rotation of 5 degrees with pain. Sensation and circulation are intact.  RADIOGRAPHS: X rays show bone on bone osteoarthritic findings in the right hip with a relatively large degenerative cyst in the femoral head without collapse.  Assessment & Plan Osteoarthritis, Hip (715.35) Impression: Right Knee  Note: Plan is for a right total hip replacement.  Plan is to go home.  Med - Dr. Dewain Penning Cards - Dr. Verdis Prime - Patient has been seen preoperatively and felt to be stable for surgery. She may stop her aspirin one week prior to surgery.   Signed electronically by Roberts Gaudy, PA-C

## 2012-05-06 NOTE — H&P (Signed)
[name]  DOB: 11/29/1960 Divorced / Language: English / Race: White Female  Date of Admission:  05/06/12  Chief Complaint:  Right Hip pain  History of Present Illness The patient is a 51 year old female who comes in  for a preoperative History and Physical. The patient is scheduled for a right total hip arthroplasty to be performed by Dr. Frank V. Aluisio, MD at [facility] on 05/06/2012. The patient is a 51 year old female who presents today for follow up of their hip. The patient is being followed for their right hip pain and osteoarthritis. They are 3 week(s) out from intra articular injection with Dr. Ramos. Note for "Follow-up Hip": She said the injection only helped for about 10 days. She said the pain is back to the level it was before. The pain in the hip is hurting at all times. It is limiting what she can and cannot do. She has had more limitations in motion. It has been going on for quite a while now. It is just getting progressively worse over time. She is at a stage where she cannot tolerate it anymore. She was hoping that the intraarticular injection would have been more beneficial, but unfortunately did not provide much benefit. Se is now ready to get the hip fixed. They have been treated conservatively in the past for the above stated problem and despite conservative measures, they continue to have progressive pain and severe functional limitations and dysfunction. They have failed non-operative management including home exercise, medications, and injections. It is felt that they would benefit from undergoing total joint replacement. Risks and benefits of the procedure have been discussed with the patient and they elect to proceed with surgery. There are no active contraindications to surgery such as ongoing infection or rapidly progressive neurological disease.      Problem List Osteoarthritis, Hip (715.35)   Allergies Compazine  *ANTIPSYCHOTICS/ANTIMANIC AGENTS* Adhesive Tape *MEDICAL DEVICES*. Skin irritation - CAN USE PAPER TAPE   Family History Congestive Heart Failure. father Heart Disease. mother and father Hypertension. father Osteoarthritis. mother Osteoporosis. mother   Social History Exercise. Exercises rarely; does other Illicit drug use. no Living situation. live with partner Drug/Alcohol Rehab (Previously). no Children. 2 Current work status. working full time Drug/Alcohol Rehab (Currently). no Marital status. divorced Tobacco use. former smoker; smoke(d) 1 1/2 pack(s) per day Number of flights of stairs before winded. 1 Pain Contract. no Tobacco / smoke exposure. no Alcohol use. current drinker; drinks beer; only occasionally per week Post-Surgical Plans. Plan for home Advance Directives. Living Will, Healthcare POA   Medication History Atorvastatin Calcium (40MG Tablet, Oral) Active. Vitamin D ( Oral) Specific dose unknown - Active. Spironolactone (25MG Tablet, Oral) Active. Evening Primrose Oil (1000MG Capsule, Oral) Active. Multiple Vitamin ( Oral) Active. Omega 3 ( Oral) Specific dose unknown - Active. Amiodarone HCl (200MG Tablet, Oral) Active. Zetia (10MG Tablet, Oral) Active. Bayer Aspirin (325MG Tablet, Oral) Active. Carvedilol (12.5MG Tablet, Oral) Active. Levothyroxine Sodium (75MCG Tablet, Oral) Active. Aloe Vera ( Oral) Active.   Past Surgical History Tubal Ligation. Date: 1993. Appendectomy. Date: 1989. Hysterectomy. Date: 2001. partial (non-cancerous) Tonsillectomy. Mid 1960's Oophorectomy; Bilateral. Date: 2002. Mitral Valve Replacement. Date: 09/2009. Pacemaker with Defribillator. Date: 10/2009.   Medical history Myocardial infarction. Date: 09/2009. Osteoarthritis Hypothyroidism Cancer. Bladder Congestive Heart Failure. Chronic Systolic Valvular heart disease. Mitral Valve Ventricular Tachycardia (Post  MI) Hyperlipidemia Coronary Artery Disease/Heart Disease   Review of Systems General:Not Present- Chills, Fever, Night Sweats, Fatigue, Weight   Gain, Weight Loss and Memory Loss. Skin:Not Present- Hives, Itching, Rash, Eczema and Lesions. HEENT:Not Present- Tinnitus, Headache, Double Vision, Visual Loss, Hearing Loss and Dentures. Respiratory:Not Present- Shortness of breath with exertion, Shortness of breath at rest, Allergies, Coughing up blood and Chronic Cough. Cardiovascular:Not Present- Chest Pain, Racing/skipping heartbeats, Difficulty Breathing Lying Down, Murmur, Swelling and Palpitations. Gastrointestinal:Not Present- Bloody Stool, Heartburn, Abdominal Pain, Vomiting, Nausea, Constipation, Diarrhea, Difficulty Swallowing, Jaundice and Loss of appetitie. Female Genitourinary:Not Present- Blood in Urine, Urinary frequency, Weak urinary stream, Discharge, Flank Pain, Incontinence, Painful Urination, Urgency, Urinary Retention and Urinating at Night. Musculoskeletal:Present- Muscle Pain and Joint Pain. Not Present- Muscle Weakness, Joint Swelling, Back Pain, Morning Stiffness and Spasms. Neurological:Not Present- Tremor, Dizziness, Blackout spells, Paralysis, Difficulty with balance and Weakness. Psychiatric:Not Present- Insomnia.   Vitals Weight: 217 lb Height: 66 in Body Surface Area: 2.14 m Body Mass Index: 35.02 kg/m Pulse: 64 (Regular) Resp.: 12 (Unlabored) BP: 102/68 (Sitting, Left Arm, Standard)    Physical Exam The physical exam findings are as follows:  Note: Patient is a 51 year old female with continued hip pain.   General Mental Status - Alert, cooperative and good historian. General Appearance- pleasant. Not in acute distress. Orientation- Oriented X3. Build & Nutrition- Well nourished and Well developed.   Head and Neck Head- normocephalic, atraumatic . Neck Global Assessment- supple. no bruit auscultated on the right and  no bruit auscultated on the left.   Eye Pupil- Bilateral- Regular and Round. Motion- Bilateral- EOMI.   Chest and Lung Exam Inspection: Chest Wall:Inspection of the chest wall reveals - Note: Defribillator/Pacemaker in left upper chest wall Auscultation: Breath sounds:- clear at anterior chest wall and - clear at posterior chest wall. Adventitious sounds:- No Adventitious sounds.   Cardiovascular Auscultation:Rhythm- Regular rate and rhythm. Heart Sounds- S1 WNL and S2 WNL. Murmurs & Other Heart Sounds:Auscultation of the heart reveals - No Murmurs.   Abdomen Palpation/Percussion:Tenderness- Abdomen is non-tender to palpation. Rigidity (guarding)- Abdomen is soft. Auscultation:Auscultation of the abdomen reveals - Bowel sounds normal.   Female Genitourinary   Note: Not done, not pertinent to present illness  Musculoskeletal   Note: She has full extension, flexion to 115 with pain, external rotation of 30 degrees with pain, and internal rotation of 5 degrees with pain. Sensation and circulation are intact.  RADIOGRAPHS: X rays show bone on bone osteoarthritic findings in the right hip with a relatively large degenerative cyst in the femoral head without collapse.  Assessment & Plan Osteoarthritis, Hip (715.35) Impression: Right Knee  Note: Plan is for a right total hip replacement.  Plan is to go home.  Med - Dr. Tammy Spears Cards - Dr. Henry Smith - Patient has been seen preoperatively and felt to be stable for surgery. She may stop her aspirin one week prior to surgery.   Signed electronically by DREW L PERKINS, PA-C 

## 2012-05-06 NOTE — Anesthesia Preprocedure Evaluation (Addendum)
Anesthesia Evaluation  Patient identified by MRN, date of birth, ID band Patient awake    Reviewed: Allergy & Precautions, H&P , NPO status , Patient's Chart, lab work & pertinent test results  Airway Mallampati: II TM Distance: >3 FB Neck ROM: Full    Dental No notable dental hx.    Pulmonary neg pulmonary ROS,  breath sounds clear to auscultation  Pulmonary exam normal       Cardiovascular Exercise Tolerance: Good hypertension, Pt. on home beta blockers + angina + CAD, + Past MI and +CHF + dysrhythmias Ventricular Tachycardia + pacemaker + Cardiac Defibrillator Rhythm:Regular Rate:Normal  S/P MI and CABG in 2011. Postop V tach requiring an AICD. Recent lead replacement in August 2013. AICD has never fired. Prescription on chart from Dr. Ladona Ridgel. No intervention required today.  Dr. Mendel Ryder is regular cardiologist. No recent angina.   Neuro/Psych negative neurological ROS  negative psych ROS   GI/Hepatic negative GI ROS, Neg liver ROS,   Endo/Other  Hypothyroidism   Renal/GU negative Renal ROS  negative genitourinary   Musculoskeletal negative musculoskeletal ROS (+)   Abdominal (+) + obese,   Peds negative pediatric ROS (+)  Hematology negative hematology ROS (+)   Anesthesia Other Findings   Reproductive/Obstetrics negative OB ROS                          Anesthesia Physical Anesthesia Plan  ASA: III  Anesthesia Plan: General   Post-op Pain Management:    Induction: Intravenous  Airway Management Planned:   Additional Equipment:   Intra-op Plan:   Post-operative Plan: Extubation in OR  Informed Consent: I have reviewed the patients History and Physical, chart, labs and discussed the procedure including the risks, benefits and alternatives for the proposed anesthesia with the patient or authorized representative who has indicated his/her understanding and acceptance.   Dental  advisory given  Plan Discussed with: CRNA  Anesthesia Plan Comments: (Discussed general versus spinal. Patient prefers general.)       Anesthesia Quick Evaluation

## 2012-05-07 ENCOUNTER — Encounter (HOSPITAL_COMMUNITY): Payer: Self-pay | Admitting: Orthopedic Surgery

## 2012-05-07 DIAGNOSIS — D62 Acute posthemorrhagic anemia: Secondary | ICD-10-CM

## 2012-05-07 DIAGNOSIS — E871 Hypo-osmolality and hyponatremia: Secondary | ICD-10-CM

## 2012-05-07 LAB — BASIC METABOLIC PANEL
Calcium: 8 mg/dL — ABNORMAL LOW (ref 8.4–10.5)
GFR calc Af Amer: 70 mL/min — ABNORMAL LOW (ref >90)
GFR calc non Af Amer: 60 mL/min — ABNORMAL LOW (ref >90)
Potassium: 4 mEq/L (ref 3.5–5.1)
Sodium: 132 mEq/L — ABNORMAL LOW (ref 135–145)

## 2012-05-07 LAB — CBC
MCH: 32.5 pg (ref 26.0–34.0)
MCHC: 34.7 g/dL (ref 30.0–36.0)
Platelets: 135 10*3/uL — ABNORMAL LOW (ref 150–400)
RDW: 13.2 % (ref 11.5–15.5)

## 2012-05-07 MED ORDER — OXYCODONE HCL 5 MG PO TABS
5.0000 mg | ORAL_TABLET | ORAL | Status: DC | PRN
  Administered 2012-05-07: 10 mg via ORAL
  Administered 2012-05-08: 5 mg via ORAL
  Administered 2012-05-08: 10 mg via ORAL
  Filled 2012-05-07 (×2): qty 2
  Filled 2012-05-07: qty 1

## 2012-05-07 NOTE — Evaluation (Signed)
Physical Therapy Evaluation Patient Details Name: MAKENZEE CHOUDHRY MRN: [id number] DOB: 12-27-1960 Today's Date: 05/07/2012 Time: 5621-3086 PT Time Calculation (min): 15 min  PT Assessment / Plan / Recommendation Clinical Impression  Pt s/p R THR.  Pt would benefit from acute PT services in order to improve independence with transfers, ambulation and one step to prepare for d/c home with ex-husband.  Pt became nauseated and vomitted upon sitting down in recliner; pt given ginger ale and cool cloth.    PT Assessment  Patient needs continued PT services    Follow Up Recommendations  Home health PT    Does the patient have the potential to tolerate intense rehabilitation      Barriers to Discharge        Equipment Recommendations  Rolling walker with 5" wheels;3 in 1 bedside comode    Recommendations for Other Services     Frequency 7X/week    Precautions / Restrictions Precautions Precautions: Posterior Hip Precaution Comments: handout posted in room Restrictions RLE Weight Bearing: Weight bearing as tolerated   Pertinent Vitals/Pain 3/10 R hip pain, ice applied, premedicated      Mobility  Bed Mobility Bed Mobility: Supine to Sit Supine to Sit: 4: Min guard Details for Bed Mobility Assistance: verbal cues for technique and precautions, pt able to move R LE off bed without assist Transfers Transfers: Stand to Sit;Sit to Stand Sit to Stand: 4: Min guard;With upper extremity assist;From bed Stand to Sit: 4: Min guard;With upper extremity assist;To chair/3-in-1 Details for Transfer Assistance: verbal cues for safe technique within precautions Ambulation/Gait Ambulation/Gait Assistance: 4: Min guard Ambulation Distance (Feet): 40 Feet Assistive device: Rolling walker Ambulation/Gait Assistance Details: verbal cues for RW distance, step length, sequence Gait Pattern: Step-to pattern;Antalgic    Shoulder Instructions     Exercises     PT Diagnosis: Difficulty  walking;Acute pain  PT Problem List: Decreased strength;Decreased activity tolerance;Decreased mobility;Decreased knowledge of precautions;Pain;Decreased knowledge of use of DME PT Treatment Interventions: DME instruction;Gait training;Stair training;Functional mobility training;Patient/family education;Therapeutic activities;Therapeutic exercise   PT Goals Acute Rehab PT Goals PT Goal Formulation: With patient Time For Goal Achievement: 05/14/12 Potential to Achieve Goals: Good Pt will go Supine/Side to Sit: with modified independence PT Goal: Supine/Side to Sit - Progress: Goal set today Pt will go Sit to Supine/Side: with modified independence PT Goal: Sit to Supine/Side - Progress: Goal set today Pt will go Sit to Stand: with modified independence PT Goal: Sit to Stand - Progress: Goal set today Pt will go Stand to Sit: with modified independence PT Goal: Stand to Sit - Progress: Goal set today Pt will Ambulate: 51 - 150 feet;with modified independence;with least restrictive assistive device PT Goal: Ambulate - Progress: Goal set today Pt will Go Up / Down Stairs: 1-2 stairs;with least restrictive assistive device;with supervision PT Goal: Up/Down Stairs - Progress: Goal set today Pt will Perform Home Exercise Program: with supervision, verbal cues required/provided PT Goal: Perform Home Exercise Program - Progress: Goal set today  Visit Information  Last PT Received On: 05/07/12 Assistance Needed: +1    Subjective Data  Subjective: I'm ready to try.   Prior Functioning  Home Living Lives With: Other (Comment) (ex-husband) Type of Home: House Home Access: Stairs to enter [employer] of Steps: 1 Entrance Stairs-Rails: None Home Layout: One level Home Adaptive Equipment: None Prior Function Level of Independence: Independent Communication Communication: No difficulties    Cognition  Overall Cognitive Status: Appears within functional limits for tasks  assessed/performed  Arousal/Alertness: Awake/alert Orientation Level: Appears intact for tasks assessed Behavior During Session: [facility] for tasks performed    Extremity/Trunk Assessment Right Upper Extremity Assessment RUE ROM/Strength/Tone: [facility] for tasks assessed Left Upper Extremity Assessment LUE ROM/Strength/Tone: WFL for tasks assessed Right Lower Extremity Assessment RLE ROM/Strength/Tone: Deficits RLE ROM/Strength/Tone Deficits: decreased hip strength per functional observation Left Lower Extremity Assessment LLE ROM/Strength/Tone: WFL for tasks assessed   Balance    End of Session PT - End of Session Activity Tolerance: Other (comment) (nausea) Patient left: in chair;with call bell/phone within reach  GP     Aeisha Minarik,KATHrine E 05/07/2012, 12:14 PM Pager: [phone]

## 2012-05-07 NOTE — Progress Notes (Signed)
Physical Therapy Treatment Note   05/07/12 1500  PT Visit Information  Last PT Received On 05/07/12  Assistance Needed +1  PT Time Calculation  PT Start Time 1515  PT Stop Time 1539  PT Time Calculation (min) 24 min  Subjective Data  Subjective I tried to wait up for you but I just had to get back in the bed.  Precautions  Precautions Posterior Hip  Precaution Comments pt able to verbalize all hip precautions  Restrictions  RLE Weight Bearing WBAT  Cognition  Overall Cognitive Status Appears within functional limits for tasks assessed/performed  Bed Mobility  Bed Mobility Supine to Sit;Sit to Supine  Supine to Sit 5: Supervision  Sit to Supine 5: Supervision  Details for Bed Mobility Assistance increased time and effort, HOB elevated  Transfers  Transfers Stand to Sit;Sit to Stand  Sit to Stand 4: Min guard;With upper extremity assist;From bed  Stand to Sit 4: Min guard;With upper extremity assist;To bed  Details for Transfer Assistance verbal cues for technique to rise however able to recall with sitting  Ambulation/Gait  Ambulation/Gait Assistance 4: Min guard  Ambulation Distance (Feet) 40 Feet  Assistive device Rolling walker  Ambulation/Gait Assistance Details verbal cues for sequence, RW distance, step length  Gait Pattern Step-to pattern;Antalgic  Exercises  Exercises Total Joint  Total Joint Exercises  Ankle Circles/Pumps AROM;20 reps;Both  [employer] reps;Both  Gluteal Sets AROM;Both;20 reps  Towel Squeeze AROM;Both;20 reps  Short Arc Quad AROM;Right;15 reps  Heel Slides AROM;Right;15 reps  Hip ABduction/ADduction AROM;Right;15 reps  Straight Leg Raises Right;AAROM;10 reps  PT - End of Session  Activity Tolerance Patient tolerated treatment well  Patient left in bed;with call bell/phone within reach  Nurse Communication Patient requests pain meds  PT - Assessment/Plan  Comments on Treatment Session Pt ambulated in hallway again and performed  exercises.  PT Plan Discharge plan remains appropriate;Frequency remains appropriate  Follow Up Recommendations Home health PT  Equipment Recommended Rolling walker with 5" wheels;3 in 1 bedside comode  Acute Rehab PT Goals  PT Goal: Supine/Side to Sit - Progress Progressing toward goal  PT Goal: Sit to Supine/Side - Progress Progressing toward goal  PT Goal: Sit to Stand - Progress Progressing toward goal  PT Goal: Stand to Sit - Progress Progressing toward goal  PT Goal: Ambulate - Progress Progressing toward goal  PT Goal: Perform Home Exercise Program - Progress Progressing toward goal  PT General Charges  $$ ACUTE PT VISIT 1 Procedure  PT Treatments  $Gait Training 8-22 mins  $Therapeutic Exercise 8-22 mins  Pt with 6/10 R hip pain.  RN notified.   Zenovia Jarred, PT Pager: [phone]

## 2012-05-07 NOTE — Progress Notes (Signed)
OT Note:  Attempted OT eval twice today (in pain once and just finished PT second attempt).  Will plan to see pt in the morning.  [place], OTR/L [phone] 05/07/2012

## 2012-05-07 NOTE — Progress Notes (Signed)
   Subjective: 1 Day Post-Op Procedure(s) (LRB): TOTAL HIP ARTHROPLASTY (Right) Patient reports pain as mild.   Patient seen in rounds with Dr. Lequita Halt. Patient is well, and has had no acute complaints or problems We will start therapy today.  Plan is to go Home after hospital stay.  Objective: Vital signs in last 24 hours: Temp:  [96.9 F (36.1 C)-98.7 F (37.1 C)] 97.2 F (36.2 C) (11/14 0655) Pulse Rate:  [66-90] 86  (11/14 0800) Resp:  [9-20] 14  (11/14 0655) BP: (90-135)/(55-78) 94/64 mmHg (11/14 0800) SpO2:  [98 %-100 %] 100 % (11/14 0655) Weight:  [99.338 kg (219 lb)] 99.338 kg (219 lb) (11/13 1350)  Intake/Output from previous day:  Intake/Output Summary (Last 24 hours) at 05/07/12 0901 Last data filed at 05/07/12 0656  Gross per 24 hour  Intake   3940 ml  Output   3110 ml  Net    830 ml    Intake/Output this shift: uop 1300 +830  Labs:  [facility] 05/07/12 0428  HGB 10.9*    Basename 05/07/12 0428  WBC 12.9*  RBC 3.35*  HCT 31.4*  PLT 135*    Basename 05/07/12 0428  NA 132*  K 4.0  CL 99  CO2 23  BUN 9  CREATININE 1.05  GLUCOSE 117*  CALCIUM 8.0*   No results found for this basename: LABPT:2,INR:2 in the last 72 hours  EXAM General - Patient is Alert, Appropriate and Oriented Extremity - Neurovascular intact Sensation intact distally Dorsiflexion/Plantar flexion intact Dressing - dressing C/D/I Motor Function - intact, moving foot and toes well on exam.  Hemovac pulled without difficulty.  Past Medical History  Diagnosis Date  . Arrhythmia     ventricular tachycardia  . Unspecified systolic heart failure   . Ventricular fibrillation   . Coronary artery disease   . ICD (implantable cardiac defibrillator) in place   . Pacemaker   . High cholesterol   . CHF (congestive heart failure)   . Myocardial infarction 2011  . Anginal pain   . Hypothyroidism   . Arthritis 02/06/2012    "everywhere"  . Bladder cancer 2009    "injected  medicine to get rid of it"  . 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   Weight as of this encounter: 219 lb(99.338 kg). Advance diet Up with therapy Plan for discharge tomorrow Discharge home with home health  DVT Prophylaxis - Xarelto, Aspirin 325 on hold Weight Bearing As Tolerated right Leg D/C Knee Immobilizer Hemovac Pulled Begin Therapy Hip Preacutions No vaccines.  PERKINS, ALEXZANDREW 05/07/2012, 9:01 AM

## 2012-05-08 LAB — CBC
MCH: 32.3 pg (ref 26.0–34.0)
MCHC: 35 g/dL (ref 30.0–36.0)
MCV: 92.4 fL (ref 78.0–100.0)
Platelets: 124 10*3/uL — ABNORMAL LOW (ref 150–400)
RDW: 13.3 % (ref 11.5–15.5)

## 2012-05-08 LAB — BASIC METABOLIC PANEL
CO2: 23 mEq/L (ref 19–32)
Calcium: 8.7 mg/dL (ref 8.4–10.5)
Creatinine, Ser: 0.96 mg/dL (ref 0.50–1.10)
GFR calc non Af Amer: 67 mL/min — ABNORMAL LOW (ref >90)
Sodium: 133 mEq/L — ABNORMAL LOW (ref 135–145)

## 2012-05-08 MED ORDER — RIVAROXABAN 10 MG PO TABS: 10.0000 mg | ORAL_TABLET | Freq: Every day | ORAL | Status: DC

## 2012-05-08 MED ORDER — ASPIRIN 81 MG PO TBEC: 81.0000 mg | DELAYED_RELEASE_TABLET | Freq: Every day | ORAL | Status: DC

## 2012-05-08 MED ORDER — ASPIRIN EC 81 MG PO TBEC
81.0000 mg | DELAYED_RELEASE_TABLET | Freq: Every day | ORAL | Status: DC
  Administered 2012-05-08: 81 mg via ORAL
  Filled 2012-05-08: qty 1

## 2012-05-08 MED ORDER — OXYCODONE HCL 5 MG PO TABS: 5.0000 mg | ORAL_TABLET | ORAL | Status: DC | PRN

## 2012-05-08 MED ORDER — METHOCARBAMOL 500 MG PO TABS: 500.0000 mg | ORAL_TABLET | Freq: Four times a day (QID) | ORAL | Status: DC | PRN

## 2012-05-08 NOTE — Progress Notes (Signed)
Physical Therapy Treatment Patient Details Name: [name] MRN: [id number] DOB: 1960/12/05 Today's Date: 05/08/2012 Time: 4696-2952 PT Time Calculation (min): 24 min  PT Assessment / Plan / Recommendation Comments on Treatment Session  Pt ambulated, practiced step, and performed exercises.  Pt also given handout for exercises.  Pt feels ready to d/c home with ex-husband.    Follow Up Recommendations  Home health PT     Does the patient have the potential to tolerate intense rehabilitation     Barriers to Discharge        Equipment Recommendations  Rolling walker with 5" wheels;3 in 1 bedside comode    Recommendations for Other Services    Frequency     Plan Discharge plan remains appropriate;Frequency remains appropriate    Precautions / Restrictions Precautions Precautions: Posterior Hip Precaution Comments: pt able to verbalize all precautions Restrictions RLE Weight Bearing: Weight bearing as tolerated   Pertinent Vitals/Pain 3/10 R hip increased to 6/10 after session, premedicated, repositioned    Mobility  Bed Mobility Bed Mobility: Supine to Sit Supine to Sit: 5: Supervision Details for Bed Mobility Assistance: HOB flat, verbal cues for technique Transfers Transfers: Stand to Sit;Sit to Stand Sit to Stand: 5: Supervision;With upper extremity assist;From bed Stand to Sit: 5: Supervision;With upper extremity assist;To chair/3-in-1 Details for Transfer Assistance: verbal cues for R LE forward Ambulation/Gait Ambulation/Gait Assistance: 5: Supervision Ambulation Distance (Feet): 140 Feet Assistive device: Rolling walker Ambulation/Gait Assistance Details: verbal cues for RW distance and step length Gait Pattern: Step-to pattern;Antalgic Stairs: Yes Stairs Assistance: 4: Min guard Stairs Assistance Details (indicate cue type and reason): verbal cues for safety and sequence Stair Management Technique: Step to pattern;Forwards;With walker Number of Stairs:  1     Exercises Total Joint Exercises Ankle Circles/Pumps: AROM;20 reps;Both Quad Sets: AROM;20 reps;Both Gluteal Sets: AROM;Both;20 reps Towel Squeeze: AROM;Both;20 reps Short Arc Quad: AROM;Right;15 reps Heel Slides: AAROM;Right;15 reps Hip ABduction/ADduction: AROM;Right;15 reps Straight Leg Raises: Right;AAROM;10 reps Long Arc Quad: AROM;Right;15 reps   PT Diagnosis:    PT Problem List:   PT Treatment Interventions:     PT Goals Acute Rehab PT Goals PT Goal: Supine/Side to Sit - Progress: Progressing toward goal PT Goal: Sit to Stand - Progress: Progressing toward goal PT Goal: Stand to Sit - Progress: Progressing toward goal PT Goal: Ambulate - Progress: Progressing toward goal PT Goal: Up/Down Stairs - Progress: Progressing toward goal PT Goal: Perform Home Exercise Program - Progress: Progressing toward goal  Visit Information  Last PT Received On: 05/08/12 Assistance Needed: +1    Subjective Data  Subjective: I just have one step into the house.   Cognition  Overall Cognitive Status: Appears within functional limits for tasks assessed/performed    Balance     End of Session PT - End of Session Activity Tolerance: Patient tolerated treatment well Patient left: in chair;with call bell/phone within reach;with family/visitor present   GP     Monteen Toops,KATHrine E 05/08/2012, 2:25 PM Pager: [phone]

## 2012-05-08 NOTE — Progress Notes (Signed)
Discharge summary sent to payer through [facility]  

## 2012-05-08 NOTE — Evaluation (Signed)
Occupational Therapy Evaluation Patient Details Name: [name] MRN: [id number] DOB: [birth year] Today's Date: 05/08/2012 Time: 7846-9629 OT Time Calculation (min): 19 min  OT Assessment / Plan / Recommendation Clinical Impression  This 51 year old female was admitted for R posterior approach THA.  She will benefit from skilled OT to continue education to safely perform adls following thps    OT Assessment  Patient needs continued OT Services    Follow Up Recommendations  Home health OT    Barriers to Discharge      Equipment Recommendations  Rolling walker with 5" wheels;3 in 1 bedside comode    Recommendations for Other Services    Frequency  Min 2X/week    Precautions / Restrictions Precautions Precautions: Posterior Hip Precaution Comments: min cues for internal rotation during adls Restrictions Weight Bearing Restrictions: No RLE Weight Bearing: Weight bearing as tolerated   Pertinent Vitals/Pain 5/10 R hip.  Repositioned.  Ice had just been removed   ADL  Grooming: Performed;Supervision/safety Where Assessed - Grooming: Supported standing Lower Body Bathing: Simulated;Supervision/safety Where Assessed - Lower Body Bathing: Supported sit to stand (min cues ) Lower Body Dressing: Performed;Minimal assistance (with ae) Where Assessed - Lower Body Dressing: Supported sit to Pharmacist, hospital: Research scientist (life sciences) Method: Sit to Barista: Raised toilet seat with arms (or 3-in-1 over toilet) Toileting - Clothing Manipulation and Hygiene: Performed;Supervision/safety Where Assessed - Engineer, mining and Hygiene: Standing Equipment Used: Rolling walker Transfers/Ambulation Related to ADLs: educated on shower transfer; pt verbalizes ADL Comments: Pt needs min cues for internal rotation during adls  Pt is set up/supervision for upper body adls    OT Diagnosis: Generalized weakness  OT Problem List:  Decreased knowledge of precautions;Decreased knowledge of use of DME or AE;Decreased strength;Decreased activity tolerance;Pain OT Treatment Interventions: Self-care/ADL training;DME and/or AE instruction;Patient/family education   OT Goals Acute Rehab OT Goals OT Goal Formulation: With patient Time For Goal Achievement: 05/08/12 Potential to Achieve Goals: Good Miscellaneous OT Goals Miscellaneous OT Goal #1: Pt will complete adls with ae following thps at supervision level with no more than 1 vc  Visit Information  Last OT Received On: 05/08/12 Assistance Needed: +1    Subjective Data  Subjective: I'm ready Patient Stated Goal: Get hip precautions mastered and be independent   Prior Functioning     Home Living Bathroom Shower/Tub: Walk-in Stage manager: Standard Prior Function Level of Independence: Independent Communication Communication: No difficulties         Vision/Perception     Cognition  Overall Cognitive Status: Appears within functional limits for tasks assessed/performed Behavior During Session: [facility] for tasks performed Cognition - Other Comments: needs reinforcement with hip precautions    Extremity/Trunk Assessment Right Upper Extremity Assessment RUE ROM/Strength/Tone: [facility] for tasks assessed Left Upper Extremity Assessment LUE ROM/Strength/Tone: WFL for tasks assessed     Mobility Bed Mobility Supine to Sit: 5: Supervision Transfers Sit to Stand: 5: Supervision;From chair/3-in-1;With upper extremity assist     Shoulder Instructions     Exercise     Balance     End of Session OT - End of Session Equipment Utilized During Treatment:  (reacher sock aid)  GO     Kayti Poss 05/08/2012, 9:19 AM Marica Otter, OTR/L [phone] 05/08/2012

## 2012-05-08 NOTE — Care Management Note (Signed)
    Page 1 of 2   05/08/2012     4:38:32 PM   CARE MANAGEMENT NOTE 05/08/2012  Patient:  [name], [name]   Account Number:  [id number]  Date Initiated:  05/08/2012  Documentation initiated by:  Colleen Can  Subjective/Objective Assessment:   dx osteoarthritis right hip; total hip replacemnt     Action/Plan:   Cm spoke with patient. Plans are for pt to return to her home where spouse will be caregiver. Plans to use [facility] for [facility] services and they will also arrange for DME   Anticipated DC Date:  05/08/2012   Anticipated DC Plan:  HOME W HOME HEALTH SERVICES  In-house referral  NA      DC Planning Services  CM consult      [facility] Choice  HOME HEALTH   Choice offered to / List presented to:  C-1 Patient   DME arranged  NA      DME agency  NA     HH arranged  HH-2 PT      [facility] agency  [facility]   Status of service:  Completed, signed off Medicare Important Message given?  NO (If response is "NO", the following Medicare IM given date fields will be blank) Date Medicare IM given:   Date Additional Medicare IM given:    Discharge Disposition:  HOME W HOME HEALTH SERVICES  Per UR Regulation:    If discussed at Long Length of Stay Meetings, dates discussed:    Comments:  05/08/2012 Dory Peru RN CCM [phone] Genevieve Norlander will start [facility] services tomorrow 05/09/2012. pt discharging today.

## 2012-05-08 NOTE — Progress Notes (Signed)
Pt to d/c home. AVS reviewed and "My Chart" discussed with pt. Pt capable of verbalizing medications and follow-up appointments. Remains hemodynamically stable. No signs and symptoms of distress. Educated pt to return to ER in the case of SOB, dizziness, or chest pain.  

## 2012-05-08 NOTE — Progress Notes (Signed)
   Subjective: 2 Days Post-Op Procedure(s) (LRB): TOTAL HIP ARTHROPLASTY (Right) Patient reports pain as mild.   Patient seen in rounds for Dr. Lequita Halt. Sitting up in chair. Patient is well, and has had no acute complaints or problems Patient is ready to go home today after therapy.  Objective: Vital signs in last 24 hours: Temp:  [97.5 F (36.4 C)-99.2 F (37.3 C)] 98.2 F (36.8 C) (11/15 0605) Pulse Rate:  [78-89] 87  (11/15 0605) Resp:  [14-20] 20  (11/15 0605) BP: (94-110)/(62-72) 100/62 mmHg (11/15 0605) SpO2:  [93 %-97 %] 93 % (11/15 0605)  Intake/Output from previous day:  Intake/Output Summary (Last 24 hours) at 05/08/12 0726 Last data filed at 05/08/12 9562  Gross per 24 hour  Intake 1548.75 ml  Output   1525 ml  Net  23.75 ml    Intake/Output this shift: Total I/O In: 240 [P.O.:240] Out: 350 [Urine:350]  Labs:  [facility] 05/08/12 0425 05/07/12 0428  HGB 9.8* 10.9*    Basename 05/08/12 0425 05/07/12 0428  WBC 19.4* 12.9*  RBC 3.03* 3.35*  HCT 28.0* 31.4*  PLT 124* 135*    Basename 05/08/12 0425 05/07/12 0428  NA 133* 132*  K 4.0 4.0  CL 98 99  CO2 23 23  BUN 8 9  CREATININE 0.96 1.05  GLUCOSE 132* 117*  CALCIUM 8.7 8.0*   No results found for this basename: LABPT:2,INR:2 in the last 72 hours  EXAM: General - Patient is Alert, Appropriate and Oriented Extremity - Neurovascular intact Sensation intact distally Dorsiflexion/Plantar flexion intact No cellulitis present Incision - clean, dry, no drainage, healing Motor Function - intact, moving foot and toes well on exam.   Assessment/Plan: 2 Days Post-Op Procedure(s) (LRB): TOTAL HIP ARTHROPLASTY (Right) Procedure(s) (LRB): TOTAL HIP ARTHROPLASTY (Right) Past Medical History  Diagnosis Date  . Arrhythmia     ventricular tachycardia  . Unspecified systolic heart failure   . Ventricular fibrillation   . Coronary artery disease   . ICD (implantable cardiac defibrillator) in place   .  Pacemaker   . High cholesterol   . CHF (congestive heart failure)   . Myocardial infarction 2011  . Anginal pain   . Hypothyroidism   . Arthritis 02/06/2012    "everywhere"  . Bladder cancer 2009    "injected medicine to get rid of it"  . 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   Weight as of this encounter: 219 lb(99.338 kg). Discharge home with home health Diet - Cardiac diet Follow up - in 2 weeks Activity - WBAT Disposition - Home Condition Upon Discharge - Good D/C Meds - See DC Summary DVT Prophylaxis - Xarelto  Tamika Shropshire 05/08/2012, 7:26 AM

## 2012-05-13 ENCOUNTER — Other Ambulatory Visit: Payer: Self-pay | Admitting: Orthopedic Surgery

## 2012-05-14 MED ORDER — AMIODARONE HCL 100 MG PO TABS: 100.0000 mg | ORAL_TABLET | Freq: Every morning | ORAL | Status: DC

## 2012-05-14 NOTE — Progress Notes (Signed)
LATE ENTRY NOTE Date of Service of Visit - 05/14/12  The patient was discharged to home on 05/08/12 following her Right Total Hip Replacement by Dr. Lequita Halt.  The patient called our office yesterday on 05/13/12 questioning about one of her medications that was discontinued at time of discharge.  Her Amiodarone (Pacerone) was mistakenly discontinued at the time of reconciliation of her medications.  She did not stop the medication and we informed her that it should not have been discontinued.  I have reordered the medication today in the EPIC system so it will show that she is currently taking the medication and that is was not supposed to be stopped.  Her final discharge summary was pending at the time of this note and the finalized summary will reflect that she should resume and remain on the medication. Avel Peace, PA-C

## 2012-05-14 NOTE — Discharge Summary (Signed)
Physician Discharge Summary   Patient ID: [name] MRN: [id number] DOB/AGE: 10-05-1960 51 y.o.  Admit date: 05/06/2012 Discharge date: 05/08/2012  Primary Diagnosis: Osteoarthritis Right hip   Admission Diagnoses:  Past Medical History  Diagnosis Date  . Arrhythmia     ventricular tachycardia  . Unspecified systolic heart failure   . Ventricular fibrillation   . Coronary artery disease   . ICD (implantable cardiac defibrillator) in place   . Pacemaker   . High cholesterol   . CHF (congestive heart failure)   . Myocardial infarction 2011  . Anginal pain   . Hypothyroidism   . Arthritis 02/06/2012    "everywhere"  . Bladder cancer 2009    "injected medicine to get rid of it"  . Granulosa cell carcinoma of ovary 2007    right; "had chemo"   Discharge Diagnoses:   Principal Problem:  *OA (osteoarthritis) of hip Active Problems:  Postop Hyponatremia  Postop Acute blood loss anemia  Estimated Body mass index is 35.35 kg/(m^2) as calculated from the following:   Height as of this encounter: 5\' 6" (1.676 m).   Weight as of this encounter: 219 lb(99.338 kg).  Classification of overweight in adults according to BMI (WHO, 1998)   Procedure: Procedure(s) (LRB): TOTAL HIP ARTHROPLASTY (Right)   Consults: None  HPI: FREDRICK DRAY is a 51 y.o. female with end stage arthritis of her right hip with progressively worsening pain and dysfunction. Pain occurs with activity and rest including pain at night. She has tried analgesics, protected weight bearing and rest without benefit. Pain is too severe to attempt physical therapy. Radiographs demonstrate bone on bone arthritis with subchondral cyst formation. She presents now for right THA.  Laboratory Data: Admission on 05/06/2012, Discharged on 05/08/2012  Component Date Value Range Status  . ABO/RH(D) 05/06/2012 AB POS   Final  . Antibody Screen 05/06/2012 NEG   Final  . Sample Expiration 05/06/2012 05/09/2012   Final   . ABO/RH(D) 05/06/2012 AB POS   Final  . WBC 05/07/2012 12.9* 4.0 - 10.5 K/uL Final  . RBC 05/07/2012 3.35* 3.87 - 5.11 MIL/uL Final  . Hemoglobin 05/07/2012 10.9* 12.0 - 15.0 g/dL Final  . HCT 40/98/1191 31.4* 36.0 - 46.0 % Final  . MCV 05/07/2012 93.7  78.0 - 100.0 fL Final  . MCH 05/07/2012 32.5  26.0 - 34.0 pg Final  . MCHC 05/07/2012 34.7  30.0 - 36.0 g/dL Final  . RDW 47/82/9562 13.2  11.5 - 15.5 % Final  . Platelets 05/07/2012 135* 150 - 400 K/uL Final  . Sodium 05/07/2012 132* 135 - 145 mEq/L Final  . Potassium 05/07/2012 4.0  3.5 - 5.1 mEq/L Final  . Chloride 05/07/2012 99  96 - 112 mEq/L Final  . CO2 05/07/2012 23  19 - 32 mEq/L Final  . Glucose, Bld 05/07/2012 117* 70 - 99 mg/dL Final  . BUN 13/01/6577 9  6 - 23 mg/dL Final  . Creatinine, Ser 05/07/2012 1.05  0.50 - 1.10 mg/dL Final  . Calcium 46/96/2952 8.0* 8.4 - 10.5 mg/dL Final  . GFR calc non Af Amer 05/07/2012 60* >90 mL/min Final  . GFR calc Af Amer 05/07/2012 70* >90 mL/min Final   Comment:                                 The eGFR has been calculated  using the CKD EPI equation.                          This calculation has not been                          validated in all clinical                          situations.                          eGFR's persistently                          <90 mL/min signify                          possible Chronic Kidney Disease.  . WBC 05/08/2012 19.4* 4.0 - 10.5 K/uL Final  . RBC 05/08/2012 3.03* 3.87 - 5.11 MIL/uL Final  . Hemoglobin 05/08/2012 9.8* 12.0 - 15.0 g/dL Final  . HCT 09/81/1914 28.0* 36.0 - 46.0 % Final  . MCV 05/08/2012 92.4  78.0 - 100.0 fL Final  . MCH 05/08/2012 32.3  26.0 - 34.0 pg Final  . MCHC 05/08/2012 35.0  30.0 - 36.0 g/dL Final  . RDW 78/29/5621 13.3  11.5 - 15.5 % Final  . Platelets 05/08/2012 124* 150 - 400 K/uL Final  . Sodium 05/08/2012 133* 135 - 145 mEq/L Final  . Potassium 05/08/2012 4.0  3.5 - 5.1 mEq/L Final  .  Chloride 05/08/2012 98  96 - 112 mEq/L Final  . CO2 05/08/2012 23  19 - 32 mEq/L Final  . Glucose, Bld 05/08/2012 132* 70 - 99 mg/dL Final  . BUN 30/86/5784 8  6 - 23 mg/dL Final  . Creatinine, Ser 05/08/2012 0.96  0.50 - 1.10 mg/dL Final  . Calcium 69/62/9528 8.7  8.4 - 10.5 mg/dL Final  . GFR calc non Af Amer 05/08/2012 67* >90 mL/min Final  . GFR calc Af Amer 05/08/2012 77* >90 mL/min Final   Comment:                                 The eGFR has been calculated                          using the CKD EPI equation.                          This calculation has not been                          validated in all clinical                          situations.                          eGFR's persistently                          <90 mL/min signify  possible Chronic Kidney Disease.  Hospital Outpatient Visit on 04/30/2012  Component Date Value Range Status  . MRSA, PCR 04/30/2012 NEGATIVE  NEGATIVE Final  . Staphylococcus aureus 04/30/2012 NEGATIVE  NEGATIVE Final   Comment:                                 The Xpert SA Assay (FDA                          approved for NASAL specimens                          in patients over 41 years of age),                          is one component of                          a comprehensive surveillance                          program.  Test performance has                          been validated by [employer] for patients greater                          than or equal to 86 year old.                          It is not intended                          to diagnose infection nor to                          guide or monitor treatment.  Marland Kitchen aPTT 04/30/2012 31  24 - 37 seconds Final  . WBC 04/30/2012 12.1* 4.0 - 10.5 K/uL Final  . RBC 04/30/2012 4.26  3.87 - 5.11 MIL/uL Final  . Hemoglobin 04/30/2012 13.6  12.0 - 15.0 g/dL Final  . HCT 28/41/3244 40.0  36.0 - 46.0 % Final  . MCV 04/30/2012 93.9  78.0 -  100.0 fL Final  . MCH 04/30/2012 31.9  26.0 - 34.0 pg Final  . MCHC 04/30/2012 34.0  30.0 - 36.0 g/dL Final  . RDW 06/26/7251 13.5  11.5 - 15.5 % Final  . Platelets 04/30/2012 204  150 - 400 K/uL Final  . Sodium 04/30/2012 138  135 - 145 mEq/L Final  . Potassium 04/30/2012 4.5  3.5 - 5.1 mEq/L Final  . Chloride 04/30/2012 99  96 - 112 mEq/L Final  . CO2 04/30/2012 28  19 - 32 mEq/L Final  . Glucose, Bld 04/30/2012 78  70 - 99 mg/dL Final  . BUN 66/44/0347 9  6 - 23 mg/dL Final  . Creatinine, Ser 04/30/2012 1.21* 0.50 - 1.10 mg/dL Final  . Calcium 42/59/5638 10.0  8.4 - 10.5 mg/dL Final  .  Total Protein 04/30/2012 7.1  6.0 - 8.3 g/dL Final  . Albumin 84/69/6295 4.1  3.5 - 5.2 g/dL Final  . AST 28/41/3244 18  0 - 37 U/L Final  . ALT 04/30/2012 12  0 - 35 U/L Final  . Alkaline Phosphatase 04/30/2012 73  39 - 117 U/L Final  . Total Bilirubin 04/30/2012 0.2* 0.3 - 1.2 mg/dL Final  . GFR calc non Af Amer 04/30/2012 51* >90 mL/min Final  . GFR calc Af Amer 04/30/2012 59* >90 mL/min Final   Comment:                                 The eGFR has been calculated                          using the CKD EPI equation.                          This calculation has not been                          validated in all clinical                          situations.                          eGFR's persistently                          <90 mL/min signify                          possible Chronic Kidney Disease.  Marland Kitchen Prothrombin Time 04/30/2012 12.5  11.6 - 15.2 seconds Final  . INR 04/30/2012 0.94  0.00 - 1.49 Final  . Color, Urine 04/30/2012 YELLOW  YELLOW Final  . APPearance 04/30/2012 CLOUDY* CLEAR Final  . Specific Gravity, Urine 04/30/2012 1.015  1.005 - 1.030 Final  . pH 04/30/2012 5.5  5.0 - 8.0 Final  . Glucose, UA 04/30/2012 NEGATIVE  NEGATIVE mg/dL Final  . Hgb urine dipstick 04/30/2012 NEGATIVE  NEGATIVE Final  . Bilirubin Urine 04/30/2012 NEGATIVE  NEGATIVE Final  . Ketones, ur 04/30/2012  NEGATIVE  NEGATIVE mg/dL Final  . Protein, ur 06/26/7251 NEGATIVE  NEGATIVE mg/dL Final  . Urobilinogen, UA 04/30/2012 0.2  0.0 - 1.0 mg/dL Final  . Nitrite 66/44/0347 NEGATIVE  NEGATIVE Final  . Leukocytes, UA 04/30/2012 NEGATIVE  NEGATIVE Final   MICROSCOPIC NOT DONE ON URINES WITH NEGATIVE PROTEIN, BLOOD, LEUKOCYTES, NITRITE, OR GLUCOSE <1000 mg/dL.     X-Rays:Dg Hip Complete Right  04/30/2012  *RADIOLOGY REPORT*  Clinical Data: Preop for right hip total joint replacement with diffuse right hip pain  RIGHT HIP - COMPLETE 2+ VIEW  Comparison: None.  Findings: There is some lucency and surrounding irregular sclerosis from the superior medial right femoral head as well as marked concentric right hip joint space narrowing with some mild subchondral sclerosis along the adjacent acetabulum.  Marginal osteophytes project from the base of the right femoral head.  The findings suggest avascular necrosis and superimposed advanced degenerative joint disease.  No fracture.  There is axial hip joint space narrowing on the left with evidence of subchondral  cystic change along the articular margin of the humeral head and some mild subchondral bone irregularity but no discrete area suggesting avascular necrosis.  There are no fractures.  The bones are demineralized.  The soft tissues are unremarkable.  IMPRESSION: Advanced arthropathic changes of the right hip with less prominent arthropathic changes of the left hip.  There may be superimposed avascular necrosis from the right femoral head.   Original Report Authenticated By: Amie Portland, M.D.    Dg Pelvis Portable  05/06/2012  *RADIOLOGY REPORT*  Clinical Data: Postop right hip surgery.  PORTABLE PELVIS  Comparison: Hip radiographs 04/30/2012.  Findings: Postop right total hip arthroplasty.  A surgical drain is in place.  There is soft tissue emphysema surrounding the right hip.  No acute fracture or dislocation is seen.  The distal end of the femoral prosthesis  is not imaged on this view. Left hip osteoarthritis and right-sided tubal ligation clips are noted.  IMPRESSION: No demonstrated complication following right total hip arthroplasty.   Original Report Authenticated By: Carey Bullocks, M.D.    Dg Hip Portable 1 View Right  05/06/2012  *RADIOLOGY REPORT*  Clinical Data: Postop right total hip arthroplasty.  PORTABLE RIGHT HIP - 1 VIEW  Comparison: 04/30/2012 radiographs.  Findings: 1240 hours.  Interval right total hip arthroplasty. Hardware appears well positioned.  A surgical drain is in place. There is some soft tissue emphysema surrounding the hip.  No acute fracture or dislocation is seen.  IMPRESSION: No demonstrated complication following right total hip arthroplasty.   Original Report Authenticated By: Carey Bullocks, M.D.     EKG: Orders placed during the hospital encounter of 02/06/12  . EKG 12-LEAD  . EKG 12-LEAD  . EKG 12-LEAD  . EKG 12-LEAD  . EKG 12-LEAD  . EKG 12-LEAD  . EKG     Hospital Course: Patient was admitted to [facility] and taken to the OR and underwent the above state procedure without complications.  Patient tolerated the procedure well and was later transferred to the recovery room and then to the orthopaedic floor for postoperative care.  They were given PO and IV analgesics for pain control following their surgery.  They were given 24 hours of postoperative antibiotics of  Anti-infectives     Start     Dose/Rate Route Frequency Ordered Stop   05/06/12 1700   ceFAZolin (ANCEF) IVPB 2 g/50 mL premix        2 g 100 mL/hr over 30 Minutes Intravenous Every 6 hours 05/06/12 1355 05/07/12 0003   05/06/12 0859   ceFAZolin (ANCEF) IVPB 2 g/50 mL premix        2 g 100 mL/hr over 30 Minutes Intravenous 60 min pre-op 05/06/12 0859 05/06/12 1115         and started on DVT prophylaxis in the form of Arixtra.   PT and OT were ordered for total hip protocol.  The patient was allowed to be WBAT with therapy.  Discharge planning was consulted to help with postop disposition and equipment needs.  Patient had a decent night on the evening of surgery and started to get up OOB with therapy on day one.  Hemovac drain was pulled without difficulty.  The knee immobilizer was removed and discontinued.  Continued to work with therapy into day two.  Dressing was changed on day two and the incision was healing well.  Patient was seen in rounds and was ready to go home later that same day after therapy.  Discharge Medications: Prior to Admission medications   Medication Sig Start Date End Date Taking? Authorizing Provider  atorvastatin (LIPITOR) 40 MG tablet Take 40 mg by mouth every evening.    Yes Historical Provider, MD  carvedilol (COREG) 12.5 MG tablet Take 12.5 mg by mouth 2 (two) times daily with a meal.   Yes Historical Provider, MD  ezetimibe (ZETIA) 10 MG tablet Take 10 mg by mouth every evening.    Yes Historical Provider, MD  levothyroxine (SYNTHROID, LEVOTHROID) 75 MCG tablet Take 75 mcg by mouth daily before breakfast.    Yes Historical Provider, MD  spironolactone (ALDACTONE) 25 MG tablet Take 25 mg by mouth 2 (two) times daily.    Yes Historical Provider, MD  aspirin EC 81 MG EC tablet Take 1 tablet (81 mg total) by mouth daily. Take for three weeks along with the Xarelto.  When completed the Xarelto also stop the 81 mg aspirin and then resume the 325 mg aspirin. 05/08/12   Mickeal Daws Julien Girt, PA  methocarbamol (ROBAXIN) 500 MG tablet Take 1 tablet (500 mg total) by mouth every 6 (six) hours as needed. 05/08/12   Amira Podolak, PA  oxyCODONE (OXY IR/ROXICODONE) 5 MG immediate release tablet Take 1-3 tablets (5-15 mg total) by mouth every 3 (three) hours as needed. 05/08/12   Roxsana Riding Julien Girt, PA  rivaroxaban (XARELTO) 10 MG TABS tablet Take 1 tablet (10 mg total) by mouth daily with breakfast. Take Xarelto for two and a half more weeks, then discontinue Xarelto. Once the patient has completed  the Xarelto, they may resume the 325 mg Aspirin. 05/08/12   Fatumata Kashani Julien Girt, PA    amiodarone 200 MG tablet daily by mouth   Commonly known as: PACERONE    Diet: Cardiac diet Activity:WBAT No bending hip over 90 degrees- A "L" Angle Do not cross legs Do not let foot roll inward When turning these patients a pillow should be placed between the patient's legs to prevent crossing. Patients should have the affected knee fully extended when trying to sit or stand from all surfaces to prevent excessive hip flexion. When ambulating and turning toward the affected side the affected leg should have the toes turned out prior to moving the walker and the rest of patient's body as to prevent internal rotation/ turning in of the leg. Abduction pillows are the most effective way to prevent a patient from not crossing legs or turning toes in at rest. If an abduction pillow is not ordered placing a regular pillow length wise between the patient's legs is also an effective reminder. It is imperative that these precautions be maintained so that the surgical hip does not dislocate. Follow-up:in 2 weeks Disposition - Home Discharged Condition: good   Discharge Orders    Future Appointments: Provider: Department: Dept Phone: Center:   05/26/2012 11:15 AM Marinus Maw, MD [facility] Main Office [place]) [phone] [id number]     Future Orders Please Complete By Expires   Diet - low sodium heart healthy      Call MD / Call 911      Comments:   If you experience chest pain or shortness of breath, CALL 911 and be transported to the hospital emergency room.  If you develope a fever above 101 F, pus (white drainage) or increased drainage or redness at the wound, or calf pain, call your surgeon's office.   Discharge instructions      Comments:   Pick up stool softner and laxative for home. Do not submerge  incision under water. May shower. Continue to use ice for pain and swelling from  surgery. Hip precautions.  Total Hip Protocol.  Take Xarelto for two and a half more weeks, then discontinue Xarelto. Once the patient has completed the Xarelto, they may resume the 325 mg Aspirin.   Constipation Prevention      Comments:   Drink plenty of fluids.  Prune juice may be helpful.  You may use a stool softener, such as Colace (over the counter) 100 mg twice a day.  Use MiraLax (over the counter) for constipation as needed.   Increase activity slowly as tolerated      Patient may shower      Comments:   You may shower without a dressing once there is no drainage.  Do not wash over the wound.  If drainage remains, do not shower until drainage stops.   Weight bearing as tolerated      Driving restrictions      Comments:   No driving until released by the physician.   Lifting restrictions      Comments:   No lifting until released by the physician.   Follow the hip precautions as taught in Physical Therapy      Change dressing      Comments:   You may change your dressing dressing daily with sterile 4 x 4 inch gauze dressing and paper tape.  Do not submerge the incision under water.   TED hose      Comments:   Use stockings (TED hose) for 3 weeks on both leg(s).  You may remove them at night for sleeping.   Do not sit on low chairs, stoools or toilet seats, as it may be difficult to get up from low surfaces          Medication List     As of 05/14/2012  9:05 AM    STOP taking these medications         ALOE VERA JUICE PO         EVENING PRIMROSE OIL PO      HYDROcodone-acetaminophen 5-325 MG per tablet   Commonly known as: NORCO/VICODIN      KRILL OIL 1000 MG Caps      MULTIVITAMIN/IRON PO      OVER THE COUNTER MEDICATION      Vitamin D 1000 UNITS capsule      TAKE these medications         aspirin 81 MG EC tablet   Take 1 tablet (81 mg total) by mouth daily. Take for three weeks along with the Xarelto.  When completed the Xarelto also stop the 81 mg  aspirin and then resume the 325 mg aspirin.      atorvastatin 40 MG tablet   Commonly known as: LIPITOR   Take 40 mg by mouth every evening.      carvedilol 12.5 MG tablet   Commonly known as: COREG   Take 12.5 mg by mouth 2 (two) times daily with a meal.      ezetimibe 10 MG tablet   Commonly known as: ZETIA   Take 10 mg by mouth every evening.      levothyroxine 75 MCG tablet   Commonly known as: SYNTHROID, LEVOTHROID   Take 75 mcg by mouth daily before breakfast.      methocarbamol 500 MG tablet   Commonly known as: ROBAXIN   Take 1 tablet (500 mg total) by mouth every 6 (six) hours as needed.  oxyCODONE 5 MG immediate release tablet   Commonly known as: Oxy IR/ROXICODONE   Take 1-3 tablets (5-15 mg total) by mouth every 3 (three) hours as needed.      rivaroxaban 10 MG Tabs tablet   Commonly known as: XARELTO   Take 1 tablet (10 mg total) by mouth daily with breakfast. Take Xarelto for two and a half more weeks, then discontinue Xarelto.  Once the patient has completed the Xarelto, they may resume the 325 mg Aspirin.      spironolactone 25 MG tablet   Commonly known as: ALDACTONE   Take 25 mg by mouth 2 (two) times daily.     amiodarone 200 MG tablet   Commonly known as: PACERONE         Follow-up Information    Follow up with Loanne Drilling, MD. Schedule an appointment as soon as possible for a visit in 2 weeks.   Contact information:   [address], [address] [name] [address] [phone]          Signed: Patrica Duel 05/14/2012, 9:05 AM

## 2012-05-26 ENCOUNTER — Encounter: Payer: Self-pay | Admitting: Internal Medicine

## 2012-05-26 ENCOUNTER — Ambulatory Visit (INDEPENDENT_AMBULATORY_CARE_PROVIDER_SITE_OTHER)
Admission: RE | Admit: 2012-05-26 | Discharge: 2012-05-26 | Disposition: A | Discharge status: Home / Self Care | Payer: BC Managed Care – PPO | Source: Ambulatory Visit | Attending: Internal Medicine | Admitting: Internal Medicine

## 2012-05-26 ENCOUNTER — Ambulatory Visit (INDEPENDENT_AMBULATORY_CARE_PROVIDER_SITE_OTHER): Payer: BC Managed Care – PPO | Admitting: Internal Medicine

## 2012-05-26 VITALS — BP 97/62 | HR 80 | Ht 66.0 in | Wt 213.6 lb

## 2012-05-26 DIAGNOSIS — T82110A Breakdown (mechanical) of cardiac electrode, initial encounter: Secondary | ICD-10-CM

## 2012-05-26 DIAGNOSIS — T82198A Other mechanical complication of other cardiac electronic device, initial encounter: Secondary | ICD-10-CM

## 2012-05-26 DIAGNOSIS — I4901 Ventricular fibrillation: Secondary | ICD-10-CM

## 2012-05-26 DIAGNOSIS — Z95810 Presence of automatic (implantable) cardiac defibrillator: Secondary | ICD-10-CM

## 2012-05-26 DIAGNOSIS — I472 Ventricular tachycardia: Secondary | ICD-10-CM

## 2012-05-26 LAB — ICD DEVICE OBSERVATION
AL AMPLITUDE: 2.5 mv
AL IMPEDENCE ICD: 787.5 Ohm
ATRIAL PACING ICD: 0 pct
BAMS-0001: 150 {beats}/min
BAMS-0003: 70 {beats}/min
CHARGE TIME: 9.5 s
DEVICE MODEL ICD: 778164
FVT: 0
RV LEAD AMPLITUDE: 11.4 mv
RV LEAD IMPEDENCE ICD: 362.5 Ohm
TOT-0007: 1
TOT-0008: 0
TOT-0009: 2
TZAT-0004FASTVT: 8
TZAT-0012FASTVT: 200 ms
TZAT-0018FASTVT: NEGATIVE
TZAT-0019FASTVT: 7.5 V
TZAT-0020FASTVT: 1 ms
TZON-0003FASTVT: 315 ms
TZON-0003SLOWVT: 400 ms
TZON-0004FASTVT: 30
TZON-0004SLOWVT: 40
TZON-0005FASTVT: 6
TZST-0001FASTVT: 3
TZST-0001FASTVT: 4
TZST-0003FASTVT: 36 J
TZST-0003FASTVT: 40 J
VENTRICULAR PACING ICD: 0 pct
VF: 0

## 2012-05-26 NOTE — Assessment & Plan Note (Signed)
Her ICD pacing thresholds are stable. I will send her over today for chest x-ray and I still do not understand whether or not her lead is moved or not. Her pacing thresholds are not appreciably different from 3 months ago however.

## 2012-05-26 NOTE — Progress Notes (Signed)
HPI [name] returns today for followup. She is a very pleasant 51 year old woman with a history of ventricular fibrillation cardiac arrest, status post ICD implantation. 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 She underwent ICD revision several months ago. 2 weeks after her procedure, she was noted to have an increased pacing threshold. Initially, chest x-ray was ordered but the exam did not demonstrate the ventricular lead satisfactorily. She has had no ICD shocks and no other symptoms. She denies chest pain or shortness of breath. Allergies  Allergen Reactions  . Latex Hives, Rash and Other (See Comments)    "makes almost a raw spot"; tolerates latex gloves; tolerates paper tape  . Prochlorperazine Edisylate Other (See Comments)    Nervous/ flutter/ shakes     Current Outpatient Prescriptions  Medication Sig Dispense Refill  . amiodarone (PACERONE) 200 MG tablet Take 200 mg by mouth daily. 1/2 tablet by mouth daily      . aspirin 325 MG tablet Take 325 mg by mouth daily.      Marland Kitchen atorvastatin (LIPITOR) 40 MG tablet Take 40 mg by mouth every evening.       . carvedilol (COREG) 12.5 MG tablet Take 12.5 mg by mouth 2 (two) times daily with a meal.      . ezetimibe (ZETIA) 10 MG tablet Take 10 mg by mouth every evening.       Marland Kitchen levothyroxine (SYNTHROID, LEVOTHROID) 75 MCG tablet Take 75 mcg by mouth daily before breakfast.       . methocarbamol (ROBAXIN) 500 MG tablet Take 1 tablet (500 mg total) by mouth every 6 (six) hours as needed.  80 tablet  0  . oxyCODONE (OXY IR/ROXICODONE) 5 MG immediate release tablet Take 1-3 tablets (5-15 mg total) by mouth every 3 (three) hours as needed.  80 tablet  0  . spironolactone (ALDACTONE) 25 MG tablet Take 25 mg by mouth 2 (two) times daily.          Past Medical History  Diagnosis Date  . Arrhythmia     ventricular tachycardia  . Unspecified systolic heart failure   . Ventricular fibrillation    . Coronary artery disease   . ICD (implantable cardiac defibrillator) in place   . Pacemaker   . High cholesterol   . CHF (congestive heart failure)   . Myocardial infarction 2011  . Anginal pain   . Hypothyroidism   . Arthritis 02/06/2012    "everywhere"  . Bladder cancer 2009    "injected medicine to get rid of it"  . 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   Past Surgical History  Procedure Date  . Cardiac defibrillator placement 02/06/2012    "lead change"  . Insert / replace / remove pacemaker 10/2009    DUAL-CHAMBER St. JUDE DEFIBRILLATOR  . Tonsillectomy and adenoidectomy ~ 1967  . Appendectomy 1989  . Vaginal hysterectomy 2001  . Tubal ligation 1993  . Bilateral oophorectomy 2007  . Mitral valve replacement 2011    "pig valve"  . Cardiac valve replacement   . Transurethral resection of bladder 2009    "for bladder cancer"  . Thymus removed   . Total hip arthroplasty 05/06/2012    Procedure: TOTAL HIP ARTHROPLASTY;  Surgeon: Loanne Drilling, MD;  Location: [facility];  Service: Orthopedics;  Laterality: Right;  No family history on file.   History   Social History  . Marital Status: Divorced    Spouse Name: N/A    Number of Children: N/A  . Years of Education: N/A   Occupational History  .      WORKS FULL TIME   Social History Main Topics  . Smoking status: Former Smoker -- 1.5 packs/day for 20 years    Types: Cigarettes    Quit date: 10/22/2009  . Smokeless tobacco: Never Used  . Alcohol Use: No  . Drug Use: No  . Sexually Active: Not Currently   Other Topics Concern  . 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 HEART:  Regular rate rhythm, no murmurs, no rubs, no clicks Abd:  soft, positive bowel sounds, no organomegally, no rebound, no guarding Ext:  2 plus pulses, no edema, no cyanosis, no clubbing Skin:  No rashes no nodules Neuro:  CN II through XII intact, motor grossly intact   DEVICE  Normal device function.  See PaceArt for details.   Assess/Plan:

## 2012-05-26 NOTE — Patient Instructions (Signed)
Your physician wants you to follow-up in: Aug with Dr Court Joy will receive a reminder letter in the mail two months in advance. If you don't receive a letter, please call our office to schedule the follow-up appointment.  Remote monitoring is used to monitor your Pacemaker of ICD from home. This monitoring reduces the number of office visits required to check your device to one time per year. It allows [place] to keep an eye on the functioning of your device to ensure it is working properly. You are scheduled for a device check from home on 09/01/11. You may send your transmission at any time that day. If you have a wireless device, the transmission will be sent automatically. After your physician reviews your transmission, you will receive a postcard with your next transmission date.

## 2012-05-26 NOTE — Assessment & Plan Note (Signed)
She has had no recurrent ventricular arrhythmias. She will continue her current dose of amiodarone.

## 2012-08-31 ENCOUNTER — Ambulatory Visit (INDEPENDENT_AMBULATORY_CARE_PROVIDER_SITE_OTHER): Payer: BC Managed Care – PPO | Admitting: *Deleted

## 2012-08-31 ENCOUNTER — Other Ambulatory Visit: Payer: Self-pay | Admitting: Internal Medicine

## 2012-08-31 DIAGNOSIS — I4729 Other ventricular tachycardia: Secondary | ICD-10-CM

## 2012-08-31 DIAGNOSIS — I5020 Unspecified systolic (congestive) heart failure: Secondary | ICD-10-CM

## 2012-08-31 DIAGNOSIS — I472 Ventricular tachycardia: Secondary | ICD-10-CM

## 2012-08-31 DIAGNOSIS — Z95810 Presence of automatic (implantable) cardiac defibrillator: Secondary | ICD-10-CM

## 2012-09-01 LAB — REMOTE ICD DEVICE
BAMS-0001: 150 {beats}/min
BAMS-0003: 70 {beats}/min
DEV-0020ICD: NEGATIVE
RV LEAD AMPLITUDE: 9.1 mv
TZAT-0001FASTVT: 1
TZAT-0004FASTVT: 8
TZAT-0018FASTVT: NEGATIVE
TZON-0003FASTVT: 315 ms
TZON-0010FASTVT: 40 ms
TZON-0010SLOWVT: 40 ms
TZST-0001FASTVT: 3
TZST-0003FASTVT: 30 J
TZST-0003FASTVT: 40 J
VENTRICULAR PACING ICD: 1 pct

## 2012-09-17 MED ORDER — ASPIRIN 81 MG PO TBEC: 81.0000 mg | DELAYED_RELEASE_TABLET | Freq: Every day | ORAL | Status: DC

## 2012-09-17 NOTE — Addendum Note (Signed)
Addended by: Patrica Duel on: 09/17/2012 11:45 AM   Modules accepted: Orders

## 2012-11-11 ENCOUNTER — Telehealth: Payer: Self-pay | Admitting: Hematology & Oncology

## 2012-11-11 NOTE — Telephone Encounter (Signed)
Pt aware of 5-28 appointment °

## 2012-11-18 ENCOUNTER — Ambulatory Visit: Payer: BC Managed Care – PPO

## 2012-11-18 ENCOUNTER — Other Ambulatory Visit: Payer: Self-pay | Admitting: *Deleted

## 2012-11-18 ENCOUNTER — Ambulatory Visit (HOSPITAL_BASED_OUTPATIENT_CLINIC_OR_DEPARTMENT_OTHER): Payer: BC Managed Care – PPO | Admitting: Hematology & Oncology

## 2012-11-18 ENCOUNTER — Other Ambulatory Visit (HOSPITAL_BASED_OUTPATIENT_CLINIC_OR_DEPARTMENT_OTHER): Payer: BC Managed Care – PPO | Admitting: Lab

## 2012-11-18 VITALS — BP 115/71 | HR 80 | Temp 98.0°F | Resp 16 | Ht 65.0 in | Wt 217.0 lb

## 2012-11-18 DIAGNOSIS — R109 Unspecified abdominal pain: Secondary | ICD-10-CM

## 2012-11-18 DIAGNOSIS — R1900 Intra-abdominal and pelvic swelling, mass and lump, unspecified site: Secondary | ICD-10-CM

## 2012-11-18 DIAGNOSIS — Z8543 Personal history of malignant neoplasm of ovary: Secondary | ICD-10-CM

## 2012-11-18 LAB — CBC WITH DIFFERENTIAL (CANCER CENTER ONLY)
BASO%: 0.3 % (ref 0.0–2.0)
Eosinophils Absolute: 0.1 10*3/uL (ref 0.0–0.5)
LYMPH#: 2.8 10*3/uL (ref 0.9–3.3)
MCV: 95 fL (ref 81–101)
MONO#: 1 10*3/uL — ABNORMAL HIGH (ref 0.1–0.9)
NEUT#: 10.5 10*3/uL — ABNORMAL HIGH (ref 1.5–6.5)
Platelets: 227 10*3/uL (ref 145–400)
RBC: 4.07 10*6/uL (ref 3.70–5.32)
RDW: 13.3 % (ref 11.1–15.7)
WBC: 14.4 10*3/uL — ABNORMAL HIGH (ref 3.9–10.0)

## 2012-11-18 LAB — COMPREHENSIVE METABOLIC PANEL
ALT: 12 U/L (ref 0–35)
Albumin: 4.2 g/dL (ref 3.5–5.2)
CO2: 23 mEq/L (ref 19–32)
Glucose, Bld: 100 mg/dL — ABNORMAL HIGH (ref 70–99)
Potassium: 4.6 mEq/L (ref 3.5–5.3)
Sodium: 136 mEq/L (ref 135–145)
Total Protein: 6.7 g/dL (ref 6.0–8.3)

## 2012-11-18 LAB — CA 125: CA 125: 23.9 U/mL (ref 0.0–30.2)

## 2012-11-18 LAB — CEA: CEA: 3.1 ng/mL (ref 0.0–5.0)

## 2012-11-18 LAB — PREALBUMIN: Prealbumin: 33.7 mg/dL (ref 17.0–34.0)

## 2012-11-18 MED ORDER — HYDROCODONE-ACETAMINOPHEN 5-325 MG PO TABS: ORAL_TABLET | ORAL | Status: DC

## 2012-11-18 NOTE — Progress Notes (Signed)
Order was changed multiple times for US guided biopsy. IR was having difficulty getting it scheduled with the way it was ordered. Confirmation was obtained after this entry that it was the correct one. The case will now be sent on for review by the radiologists to determine if a biopsy can be done.

## 2012-11-18 NOTE — Progress Notes (Signed)
This office note has been dictated.

## 2012-11-19 NOTE — Progress Notes (Signed)
CC:   Tammy R. Collins Scotland, M.D. Guy Sandifer Henderson Cloud, M.D. Paola A. Duard Brady, MD  DIAGNOSES: 1. Pelvic mass. 2. History of ovarian granulosis cell tumor.  HISTORY OF PRESENT ILLNESS:  Ms. Lastinger is a very charming, 52 year old white female patient from [place].  She went to [employer]. She initially presented back in 08/2005.  At that point in time, she was having some pelvic pain.  She also underwent a CT scan which showed a large mass in the right pelvis.  This was ultimately resected.  She was operated on by Dr. Harold Hedge.  She underwent a bilateral salpingo- oophorectomy.  Partial omentectomy was done.  Pathology revealed a granulosa cell tumor.  She was stage IC.  She subsequently underwent systemic chemotherapy for this.  She received 3 cycles of chemotherapy with BEP.  She tolerated this quite well.  I do not think she had elevated tumor marker  Since then, she has been doing okay.  She did have cardiac surgery a couple of years ago.  She was down at [facility]  She had defibrillator placed.  She is on aspirin right now  She began to have some abdominal pain a few months ago.  She had no problems with bowels or bladder.  She had no bleeding.  She had no fevers, sweats, or chills.  She did not note any cough or shortness of breath  She sees Dr. Herb Grays.  Dr. Collins Scotland being very astute, went ahead and ordered a CT scan on her.  This was done on May 20th.  Shockingly enough, the CT scan showed an anterior pelvic mass measuring 9.5 x 10 x 11.5 cm.  It was cystic and solid components.  The mass abuts the dorsal aspect of the anterior abdominal wall.  There were 2 rim enhancing fluid collections in the posterior pelvis.  One measured 5.3 x 4.3 cm on the right and the other measured 5 x 5 cm on the left.  She also noticed some solid nodules adjacent to the distal sigmoid colon.  Largest measured 3.7 x 2.8 cm.  Also noted is a 3 x 2 cm soft tissue mass anterior to the pelvic  musculature.  Upon finding out the CT scan results, Ms. Hook was kindly referred to the [facility] for an evaluation  Again, she has had no weight loss or weight gain.  She is on some Vicodin to help with abdominal pain.  She has had no rashes.  There has been no leg swelling.  She has had no headache.  PAST MEDICAL HISTORY:  Remarkable for: 1. Myocardial infarction, status post defibrillator. 2. Right hip replacement. 3. Hypothyroidism. 4. 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 5. Hyperlipidemia.  ALLERGIES: 1. Latex. 2. Compazine.  MEDICATIONS:  Vicodin (5/325) 1-2 p.o. q.6 hours p.r.n., amiodarone 100 mg p.o. daily, aspirin 325 mg p.o. daily, Lipitor 40 mg p.o. q.h.s., Coreg 12.5 mg p.o. b.i.d., Synthroid 0.075 mg p.o. daily, Aldactone 25 mg p.o. b.i.d.  SOCIAL HISTORY:  Remarkable for occasional tobacco use.  She has no significant alcohol use.  There is no occupational exposures.  FAMILY HISTORY:  Remarkable for mother who recently had ovarian cancer.  REVIEW OF SYSTEMS:  As stated in history present illness.  She does have some abdominal discomfort.  PHYSICAL EXAMINATION:  General:  This is a well-developed, well- nourished, white female in no obvious distress.  Vital signs: Temperature of 97.8, pulse 80, respiratory rate 16, blood pressure 115/71.  Weight  is 217.  Head and neck:  Shows a normocephalic, atraumatic skull.  There are no ocular or oral lesions.  There are no palpable cervical or supraclavicular lymph nodes.  Lungs:  Clear to percussion and auscultation bilaterally.  Cardiac:  Regular rate and rhythm with a normal S1 and S2.  There are no murmurs, rubs or bruits. Abdomen:  Soft.  She has good bowel sounds.  There is some slight fullness in the right lower quadrant.  There is some tenderness to palpation in this region.  She does have a palpable subcutaneous mass in the right lower quadrant.  Again, just adjacent to the  midline.  There is no palpable hepatosplenomegaly.  Back:  No tenderness over the spine, ribs, or hips.  Extremities:  Show no clubbing, cyanosis or edema.  She has good range motion of her joints.  Neurological:  Shows no focal neurological deficits.  LABORATORY STUDIES:  White cell count 14.4, hemoglobin 13.1, hematocrit 38.5, platelet count 227.  IMPRESSION:  Ms. Hanaway is a very charming, 52 year old white female with a past history of stage IC granulosa cell tumor of the ovary.  She had her ovaries taken out 7 years ago.  She had chemotherapy for this.  I am not sure as to what this mass could be.  One might consider a teratoma.  One could consider an ovarian cancer even though her ovaries were removed.  A sarcoma is also a possibility although she has not had radiation therapy to the pelvis.  I think a biopsy is clearly indicated.  This will guide [place] as a how we need to proceed.  I would have to think that surgical resection is ultimately going to be necessary for this.  I do not see that we need to do any additional x-rays studies on her right now.  I think the results of the biopsy will guide [place] as to how we need to proceed with her staging studies.  I spent a good hour or so with Mr. Lomanto and her husband and I think sister.  They are all very, very nice.  I gave Ms. Christenson a prayer blanket, which she appreciated quite a bit.  We will go ahead and get a biopsy set up for her.  We will get this set up for next week.  I will be back in touch with Ms. Degrave once we get the results back from the biopsy.  I did send off CEA and CA-125 studies.   ______________________________ Josph Macho, M.D. PRE/MEDQ  D:  11/18/2012  T:  11/19/2012  Job:  [id number]

## 2012-11-23 ENCOUNTER — Encounter (HOSPITAL_COMMUNITY): Payer: Self-pay | Admitting: Pharmacy Technician

## 2012-11-24 ENCOUNTER — Other Ambulatory Visit: Payer: Self-pay | Admitting: Radiology

## 2012-11-26 ENCOUNTER — Other Ambulatory Visit: Payer: Self-pay | Admitting: Radiology

## 2012-11-27 ENCOUNTER — Telehealth: Payer: Self-pay | Admitting: *Deleted

## 2012-11-27 NOTE — Telephone Encounter (Signed)
Called patient to let her know that it is ok to take Amoxicillin 500 mg bid prior to Biopsy per dr. Myna Hidalgo

## 2012-11-30 ENCOUNTER — Ambulatory Visit (INDEPENDENT_AMBULATORY_CARE_PROVIDER_SITE_OTHER): Payer: BC Managed Care – PPO | Admitting: *Deleted

## 2012-11-30 ENCOUNTER — Ambulatory Visit (HOSPITAL_COMMUNITY)
Admission: RE | Admit: 2012-11-30 | Discharge: 2012-11-30 | Disposition: A | Discharge status: Home / Self Care | Payer: BC Managed Care – PPO | Source: Ambulatory Visit | Attending: Hematology & Oncology | Admitting: Hematology & Oncology

## 2012-11-30 ENCOUNTER — Encounter: Payer: Self-pay | Admitting: Internal Medicine

## 2012-11-30 ENCOUNTER — Encounter (HOSPITAL_COMMUNITY): Payer: Self-pay

## 2012-11-30 DIAGNOSIS — R1900 Intra-abdominal and pelvic swelling, mass and lump, unspecified site: Secondary | ICD-10-CM

## 2012-11-30 DIAGNOSIS — I509 Heart failure, unspecified: Secondary | ICD-10-CM | POA: Insufficient documentation

## 2012-11-30 DIAGNOSIS — I5020 Unspecified systolic (congestive) heart failure: Secondary | ICD-10-CM

## 2012-11-30 DIAGNOSIS — Z8551 Personal history of malignant neoplasm of bladder: Secondary | ICD-10-CM | POA: Insufficient documentation

## 2012-11-30 DIAGNOSIS — I472 Ventricular tachycardia: Secondary | ICD-10-CM

## 2012-11-30 DIAGNOSIS — I4729 Other ventricular tachycardia: Secondary | ICD-10-CM

## 2012-11-30 DIAGNOSIS — I2510 Atherosclerotic heart disease of native coronary artery without angina pectoris: Secondary | ICD-10-CM | POA: Insufficient documentation

## 2012-11-30 DIAGNOSIS — E039 Hypothyroidism, unspecified: Secondary | ICD-10-CM | POA: Insufficient documentation

## 2012-11-30 DIAGNOSIS — Z79899 Other long term (current) drug therapy: Secondary | ICD-10-CM | POA: Insufficient documentation

## 2012-11-30 DIAGNOSIS — Z95810 Presence of automatic (implantable) cardiac defibrillator: Secondary | ICD-10-CM

## 2012-11-30 DIAGNOSIS — Z8543 Personal history of malignant neoplasm of ovary: Secondary | ICD-10-CM

## 2012-11-30 DIAGNOSIS — I252 Old myocardial infarction: Secondary | ICD-10-CM | POA: Insufficient documentation

## 2012-11-30 DIAGNOSIS — R1909 Other intra-abdominal and pelvic swelling, mass and lump: Secondary | ICD-10-CM | POA: Insufficient documentation

## 2012-11-30 DIAGNOSIS — E7800 Pure hypercholesterolemia, unspecified: Secondary | ICD-10-CM | POA: Insufficient documentation

## 2012-11-30 LAB — REMOTE ICD DEVICE
AL IMPEDENCE ICD: 1075 Ohm
BAMS-0001: 150 {beats}/min
BAMS-0003: 70 {beats}/min
DEV-0020ICD: NEGATIVE
RV LEAD AMPLITUDE: 9.7 mv
TZAT-0004FASTVT: 8
TZAT-0012FASTVT: 200 ms
TZAT-0013FASTVT: 3
TZAT-0018FASTVT: NEGATIVE
TZON-0003FASTVT: 315 ms
TZON-0003SLOWVT: 400 ms
TZON-0010FASTVT: 40 ms
TZON-0010SLOWVT: 40 ms
TZST-0001FASTVT: 3
TZST-0003FASTVT: 40 J

## 2012-11-30 LAB — CBC
HCT: 36.2 % (ref 36.0–46.0)
MCHC: 35.1 g/dL (ref 30.0–36.0)
MCV: 93.1 fL (ref 78.0–100.0)
Platelets: 177 10*3/uL (ref 150–400)
RDW: 13.8 % (ref 11.5–15.5)

## 2012-11-30 LAB — APTT: aPTT: 29 seconds (ref 24–37)

## 2012-11-30 MED ORDER — FENTANYL CITRATE 0.05 MG/ML IJ SOLN
INTRAMUSCULAR | Status: AC
  Filled 2012-11-30: qty 6

## 2012-11-30 MED ORDER — SODIUM CHLORIDE 0.9 % IV SOLN
INTRAVENOUS | Status: DC
  Administered 2012-11-30: 08:00:00 via INTRAVENOUS

## 2012-11-30 MED ORDER — FENTANYL CITRATE 0.05 MG/ML IJ SOLN
INTRAMUSCULAR | Status: AC | PRN
  Administered 2012-11-30: 50 ug via INTRAVENOUS
  Administered 2012-11-30 (×2): 25 ug via INTRAVENOUS

## 2012-11-30 MED ORDER — MIDAZOLAM HCL 2 MG/2ML IJ SOLN
INTRAMUSCULAR | Status: AC
  Filled 2012-11-30: qty 6

## 2012-11-30 MED ORDER — MIDAZOLAM HCL 2 MG/2ML IJ SOLN
INTRAMUSCULAR | Status: AC | PRN
  Administered 2012-11-30: 1 mg via INTRAVENOUS
  Administered 2012-11-30 (×2): 0.5 mg via INTRAVENOUS

## 2012-11-30 NOTE — Procedures (Signed)
Technically successful CT guided biopsy of mass within the ventral aspect of midline of lower abdomen.  No immediate post procedural complications.

## 2012-11-30 NOTE — H&P (Signed)
Chief Complaint: "I'm here for a biopsy" Referring Physician:Ennever HPI: [name] is an 52 y.o. female with prior hx of both ovarian and bladder cancer. She developed some abdominal pain and a new CT scan shows a large pelvic mass. She is referred for biopsy. PMHx and meds reviewed.  Past Medical History:  Past Medical History  Diagnosis Date  . Arrhythmia     ventricular tachycardia  . Unspecified systolic heart failure   . Ventricular fibrillation   . Coronary artery disease   . ICD (implantable cardiac defibrillator) in place   . Pacemaker   . High cholesterol   . CHF (congestive heart failure)   . Myocardial infarction 2011  . Anginal pain   . Hypothyroidism   . Arthritis 02/06/2012    "everywhere"  . Bladder cancer 2009    "injected medicine to get rid of it"  . Granulosa cell carcinoma of ovary 2007    right; "had chemo"    Past Surgical History:  Past Surgical History  Procedure Laterality Date  . Cardiac defibrillator placement  02/06/2012    "lead change"  . Insert / replace / remove pacemaker  10/2009    DUAL-CHAMBER St. JUDE DEFIBRILLATOR  . Tonsillectomy and adenoidectomy  ~ 1967  . Appendectomy  1989  . Vaginal hysterectomy  2001  . Tubal ligation  1993  . Bilateral oophorectomy  2007  . Mitral valve replacement  2011    "pig valve"  . Cardiac valve replacement    . Transurethral resection of bladder  2009    "for bladder cancer"  . Thymus removed    . Total hip arthroplasty  05/06/2012    Procedure: TOTAL HIP ARTHROPLASTY;  Surgeon: Loanne Drilling, MD;  Location: [facility];  Service: Orthopedics;  Laterality: Right;    Family History: History reviewed. No pertinent family history.  Social History:  reports that she quit smoking about 3 years ago. Her smoking use included Cigarettes. She has a 30 pack-year smoking history. She has never used smokeless tobacco. She reports that she does not drink alcohol or use illicit drugs.  Allergies:   Allergies  Allergen Reactions  . Latex Hives, Rash and Other (See Comments)    LATEX TAPE "makes almost a raw spot"; tolerates latex gloves; tolerates paper tape  . Prochlorperazine Edisylate Other (See Comments)    Nervous/ flutter/ shakes    Medications: amiodarone (PACERONE) 200 MG tablet (Taking) Sig - Route: Take 100 mg by mouth every morning. 1/2 tablet by mouth daily - Oral Class: Historical Med Number of times this order has been changed since signing: 2 Order [address] aspirin 325 MG tablet (Taking) Sig - Route: Take 325 mg by mouth daily. - Oral Class: Historical Med Number of times this order has been changed since signing: 1 Order [address] atorvastatin (LIPITOR) 40 MG tablet (Taking) Sig - Route: Take 40 mg by mouth every evening. - Oral Class: Historical Med Number of times this order has been changed since signing: 3 Order [address] carvedilol (COREG) 12.5 MG tablet (Taking) Sig - Route: Take 12.5 mg by mouth 2 (two) times daily with a meal. - Oral Class: Historical Med HYDROcodone-acetaminophen (NORCO/VICODIN) 5-325 MG per tablet (Taking) 90 tablet 2 11/18/2012 Sig: Take 1-2 tablets, IF NEEDED, for pain every 4-6 hrs. Class: Print Number of times this order has been changed since signing: 1 Order [address] levothyroxine (SYNTHROID, LEVOTHROID) 75 MCG tablet (Taking) Sig - Route: Take 75 mcg by mouth daily  before breakfast. - Oral Class: Historical Med Number of times this order has been changed since signing: 3 Order [address] Multiple Vitamins-Minerals (MULTIVITAMIN PO) (Taking) Sig - Route: Take by mouth every morning. - Oral Class: Historical Med Number of times this order has been changed since signing: 1 Order [address] spironolactone (ALDACTONE) 25 MG tablet (Taking) Sig - Route: Take 25 mg by mouth 2 (two) times     Please HPI for pertinent positives, otherwise complete 10 system ROS negative.  Physical Exam: BP 120/70  Pulse 78  Temp(Src) 98.1 F (36.7 C)  (Oral)  Resp 16  Ht 5\' 6"  (1.676 m)  Wt 217 lb (98.431 kg)  BMI 35.04 kg/m2  SpO2 97% Body mass index is 35.04 kg/(m^2).   General Appearance:  Alert, cooperative, no distress, appears stated age  Head:  Normocephalic, without obvious abnormality, atraumatic  ENT: Unremarkable  Neck: Supple, symmetrical, trachea midline  Lungs:   Clear to auscultation bilaterally, no w/r/r, respirations unlabored without use of accessory muscles.  Heart:  Regular rate and rhythm, S1, S2 normal, no murmur, rub or gallop.  Abdomen:   Soft, non-tender, non distended.  Neurologic: Normal affect, no gross deficits.   Results for orders placed during the hospital encounter of 11/30/12 (from the past 48 hour(s))  APTT     Status: None   Collection Time    11/30/12  7:25 AM      Result Value Range   aPTT 29  24 - 37 seconds  CBC     Status: None   Collection Time    11/30/12  7:25 AM      Result Value Range   WBC 10.3  4.0 - 10.5 K/uL   RBC 3.89  3.87 - 5.11 MIL/uL   Hemoglobin 12.7  12.0 - 15.0 g/dL   HCT 19.1  47.8 - 29.5 %   MCV 93.1  78.0 - 100.0 fL   MCH 32.6  26.0 - 34.0 pg   MCHC 35.1  30.0 - 36.0 g/dL   RDW 62.1  30.8 - 65.7 %   Platelets 177  150 - 400 K/uL  PROTIME-INR     Status: None   Collection Time    11/30/12  7:25 AM      Result Value Range   Prothrombin Time 12.9  11.6 - 15.2 seconds   INR 0.98  0.00 - 1.49   No results found.  Assessment/Plan Pelvic mass with history of ovarian and bladder cancer For CT biopsy. Discussed procedure, risks, complications, use of sedation Labs reviewed, consent signed in chart  Brayton El PA-C 11/30/2012, 8:54 AM

## 2012-12-18 ENCOUNTER — Ambulatory Visit: Payer: BC Managed Care – PPO | Attending: Gynecology | Admitting: Gynecology

## 2012-12-18 ENCOUNTER — Encounter: Payer: Self-pay | Admitting: Gynecology

## 2012-12-18 VITALS — BP 110/70 | HR 64 | Temp 98.1°F | Resp 18 | Ht 66.34 in | Wt 216.3 lb

## 2012-12-18 DIAGNOSIS — Z96649 Presence of unspecified artificial hip joint: Secondary | ICD-10-CM | POA: Insufficient documentation

## 2012-12-18 DIAGNOSIS — Z95810 Presence of automatic (implantable) cardiac defibrillator: Secondary | ICD-10-CM | POA: Insufficient documentation

## 2012-12-18 DIAGNOSIS — Z9221 Personal history of antineoplastic chemotherapy: Secondary | ICD-10-CM | POA: Insufficient documentation

## 2012-12-18 DIAGNOSIS — Z79899 Other long term (current) drug therapy: Secondary | ICD-10-CM | POA: Insufficient documentation

## 2012-12-18 DIAGNOSIS — Z7982 Long term (current) use of aspirin: Secondary | ICD-10-CM | POA: Insufficient documentation

## 2012-12-18 DIAGNOSIS — Z90710 Acquired absence of both cervix and uterus: Secondary | ICD-10-CM | POA: Insufficient documentation

## 2012-12-18 DIAGNOSIS — E7800 Pure hypercholesterolemia, unspecified: Secondary | ICD-10-CM | POA: Insufficient documentation

## 2012-12-18 DIAGNOSIS — Z87891 Personal history of nicotine dependence: Secondary | ICD-10-CM | POA: Insufficient documentation

## 2012-12-18 DIAGNOSIS — D3910 Neoplasm of uncertain behavior of unspecified ovary: Secondary | ICD-10-CM

## 2012-12-18 DIAGNOSIS — E039 Hypothyroidism, unspecified: Secondary | ICD-10-CM | POA: Insufficient documentation

## 2012-12-18 DIAGNOSIS — Z954 Presence of other heart-valve replacement: Secondary | ICD-10-CM | POA: Insufficient documentation

## 2012-12-18 DIAGNOSIS — I2510 Atherosclerotic heart disease of native coronary artery without angina pectoris: Secondary | ICD-10-CM | POA: Insufficient documentation

## 2012-12-18 NOTE — Progress Notes (Signed)
Consult Note: Gyn-Onc   HANALEI GLACE 52 y.o. female  Chief Complaint  Patient presents with  . Recurrent Granulosa Cell tumor    New Consult    Assessment : Recurrent granulosa cell tumor.  Plan: I recommend that we attempt to surgically resect the recurrent tumors in the abdomen and pelvis. Certainly, chemotherapy will be recommended postoperatively.  Patient wished to go ahead with surgery. She is offered the opportunity have surgery at [facility] in early July but would prefer to have surgery hearing [facility]. Her next available operating day in 01/19/2013.  The risks of surgery including hemorrhage infection injury to adjacent viscera thromboembolic complications and anesthetic risks were reviewed with the patient and her cousin. She is aware that this could indeed require extensive resection including bowel surgery.  HPI: 52 year old white female seen in consultation request of Dr. Junie Panning regarding management of recurrent granulosa cell tumor. Patient first underwent surgery in 2007 vitamin stage I C. granulosa cell tumor. She received 3 cycles of bleomycin, etoposide, and cisplatin chemotherapy as an adjunct.  Recently the patient developed a lower nominal pain and noticed a "knot" in the region of her prior Pfannenstiel incision. A CT scan was obtained showing a large mass in the anterior abdominal wall as well as 2 other masses in the pelvis. The anterior mass measures 9.5 x 10 x 11.5 cm with cystic and solid components. In addition there 2 masses in the pelvis measuring 5.3 x 4.3 and 5.5 x 5.0 cm. A fine-needle biopsy of the mass is then obtained confirming recurrent granulosa cell tumor. The mass seems to extend into the anterior abdominal wall in the pelvis.  The patient denies any weight loss or any GI or GU symptoms. Review of Systems:10 point review of systems is negative except as noted in interval history.   Vitals: Blood pressure 110/70, pulse 64, temperature 98.1 F  (36.7 C), temperature source Oral, resp. rate 18, height 5' 6.34" (1.685 m), weight 98.113 kg (216 lb 4.8 oz).  Physical Exam: General : The patient is a healthy woman in no acute distress.  HEENT: normocephalic, extraoccular movements normal; neck is supple without thyromegally  Lynphnodes: Supraclavicular and inguinal nodes not enlarged  Abdomen: Soft, non-tender, no ascites, no organomegally,  no her there is a palpable mass just anterior to the Pfannenstiel incision measuring approximately 3 cm. There is also fullness throughout the right lower quadrant of the abdomen consistent with the mass.nias  Pelvic:  EGBUS: Normal female  Vagina: Normal, no lesions  Urethra and Bladder: Normal, non-tender  Cervix: Surgically absent  Uterus: Surgically absent  Bi-manual examination:  there is nodularity in the vaginal cuff and cul-de-sac.  Rectal: normal sphincter tone, no masses, no blood  Lower extremities: No edema or varicosities. Normal range of motion      Allergies  Allergen Reactions  . Latex Hives, Rash and Other (See Comments)    LATEX TAPE "makes almost a raw spot"; tolerates latex gloves; tolerates paper tape  . Prochlorperazine Edisylate Other (See Comments)    Nervous/ flutter/ shakes    Past Medical History  Diagnosis Date  . Arrhythmia     ventricular tachycardia  . Unspecified systolic heart failure   . Ventricular fibrillation   . Coronary artery disease   . ICD (implantable cardiac defibrillator) in place   . Pacemaker   . High cholesterol   . CHF (congestive heart failure)   . Myocardial infarction 2011  . Anginal pain   . Hypothyroidism   .  Arthritis 02/06/2012    "everywhere"  . Bladder cancer 2009    "injected medicine to get rid of it"  . Granulosa cell carcinoma of ovary 2007    right; "had chemo"    Past Surgical History  Procedure Laterality Date  . Cardiac defibrillator placement  02/06/2012    "lead change"  . Insert / replace / remove  pacemaker  10/2009    DUAL-CHAMBER St. JUDE DEFIBRILLATOR  . Tonsillectomy and adenoidectomy  ~ 1967  . Appendectomy  1989  . Vaginal hysterectomy  2001  . Tubal ligation  1993  . Bilateral oophorectomy  2007  . Mitral valve replacement  2011    "pig valve"  . Cardiac valve replacement    . Transurethral resection of bladder  2009    "for bladder cancer"  . Thymus removed    . Total hip arthroplasty  05/06/2012    Procedure: TOTAL HIP ARTHROPLASTY;  Surgeon: Loanne Drilling, MD;  Location: [facility];  Service: Orthopedics;  Laterality: Right;    Current Outpatient Prescriptions  Medication Sig Dispense Refill  . amiodarone (PACERONE) 200 MG tablet Take 100 mg by mouth every morning. 1/2 tablet by mouth daily      . aspirin 325 MG tablet Take 325 mg by mouth daily.      Marland Kitchen atorvastatin (LIPITOR) 40 MG tablet Take 40 mg by mouth every evening.       . carvedilol (COREG) 12.5 MG tablet Take 12.5 mg by mouth 2 (two) times daily with a meal.      . cholecalciferol (VITAMIN D) 1000 UNITS tablet Take 1,000 Units by mouth 2 (two) times daily.      Marland Kitchen docusate sodium (COLACE) 100 MG capsule Take 200-300 mg by mouth daily as needed for constipation.      . fish oil-omega-3 fatty acids 1000 MG capsule Take 1 g by mouth 2 (two) times daily.      Marland Kitchen HYDROcodone-acetaminophen (NORCO/VICODIN) 5-325 MG per tablet Take 1-2 tablets, IF NEEDED, for pain every 4-6 hrs.  90 tablet  2  . levothyroxine (SYNTHROID, LEVOTHROID) 75 MCG tablet Take 75 mcg by mouth daily before breakfast.       . Multiple Vitamins-Minerals (MULTIVITAMIN PO) Take by mouth every morning.      Marland Kitchen spironolactone (ALDACTONE) 25 MG tablet Take 25 mg by mouth 2 (two) times daily.        No current facility-administered medications for this visit.    History   Social History  . Marital Status: Divorced    Spouse Name: N/A    Number of Children: N/A  . Years of Education: N/A   Occupational History  .      WORKS FULL TIME   Social  History Main Topics  . Smoking status: Former Smoker -- 1.50 packs/day for 20 years    Types: Cigarettes    Quit date: 10/22/2009  . Smokeless tobacco: Never Used  . Alcohol Use: No  . Drug Use: No  . Sexually Active: Not Currently   Other Topics Concern  . Not on file   Social History Narrative   WORKS FULL TIME   SINGLE   TOBACCO USE-YES   IMPLANTATION OF DUAL-CHAMBER St. JUDE DEFIBRILLATOR    History reviewed. No pertinent family history.    Jeannette Corpus, MD 12/18/2012, 9:13 AM

## 2012-12-18 NOTE — Patient Instructions (Addendum)
We will schedule surgery for 01/19/2013 it was a long hospital.

## 2012-12-22 NOTE — Addendum Note (Signed)
Addended by: Warner Mccreedy D on: 12/22/2012 11:33 AM   Modules accepted: Level of Service

## 2012-12-30 ENCOUNTER — Other Ambulatory Visit: Payer: Self-pay | Admitting: Hematology & Oncology

## 2013-01-01 ENCOUNTER — Encounter: Payer: Self-pay | Admitting: *Deleted

## 2013-01-08 ENCOUNTER — Encounter (HOSPITAL_COMMUNITY): Payer: Self-pay | Admitting: Pharmacy Technician

## 2013-01-12 ENCOUNTER — Ambulatory Visit (HOSPITAL_COMMUNITY)
Admission: RE | Admit: 2013-01-12 | Discharge: 2013-01-12 | Disposition: A | Discharge status: Home / Self Care | Payer: BC Managed Care – PPO | Source: Ambulatory Visit | Attending: Gynecologic Oncology | Admitting: Gynecologic Oncology

## 2013-01-12 ENCOUNTER — Encounter (HOSPITAL_COMMUNITY): Payer: Self-pay

## 2013-01-12 ENCOUNTER — Encounter (HOSPITAL_COMMUNITY)
Admission: RE | Admit: 2013-01-12 | Discharge: 2013-01-12 | Disposition: A | Discharge status: Home / Self Care | Payer: BC Managed Care – PPO | Source: Ambulatory Visit | Attending: Gynecology | Admitting: Gynecology

## 2013-01-12 DIAGNOSIS — Z01812 Encounter for preprocedural laboratory examination: Secondary | ICD-10-CM | POA: Insufficient documentation

## 2013-01-12 DIAGNOSIS — M24019 Loose body in unspecified shoulder: Secondary | ICD-10-CM | POA: Insufficient documentation

## 2013-01-12 DIAGNOSIS — Z954 Presence of other heart-valve replacement: Secondary | ICD-10-CM | POA: Insufficient documentation

## 2013-01-12 DIAGNOSIS — Z01818 Encounter for other preprocedural examination: Secondary | ICD-10-CM | POA: Insufficient documentation

## 2013-01-12 DIAGNOSIS — Z95810 Presence of automatic (implantable) cardiac defibrillator: Secondary | ICD-10-CM | POA: Insufficient documentation

## 2013-01-12 DIAGNOSIS — D3910 Neoplasm of uncertain behavior of unspecified ovary: Secondary | ICD-10-CM | POA: Insufficient documentation

## 2013-01-12 LAB — CBC WITH DIFFERENTIAL/PLATELET
Hemoglobin: 13.7 g/dL (ref 12.0–15.0)
Lymphs Abs: 3 10*3/uL (ref 0.7–4.0)
Monocytes Relative: 8 % (ref 3–12)
Neutro Abs: 6.2 10*3/uL (ref 1.7–7.7)
Neutrophils Relative %: 61 % (ref 43–77)
Platelets: 186 10*3/uL (ref 150–400)
RBC: 4.35 MIL/uL (ref 3.87–5.11)
WBC: 10.2 10*3/uL (ref 4.0–10.5)

## 2013-01-12 LAB — COMPREHENSIVE METABOLIC PANEL
ALT: 10 U/L (ref 0–35)
Albumin: 3.8 g/dL (ref 3.5–5.2)
Alkaline Phosphatase: 69 U/L (ref 39–117)
BUN: 15 mg/dL (ref 6–23)
Chloride: 100 mEq/L (ref 96–112)
GFR calc Af Amer: 48 mL/min — ABNORMAL LOW (ref >90)
Glucose, Bld: 88 mg/dL (ref 70–99)
Potassium: 4.4 mEq/L (ref 3.5–5.1)
Sodium: 138 mEq/L (ref 135–145)
Total Bilirubin: 0.2 mg/dL — ABNORMAL LOW (ref 0.3–1.2)
Total Protein: 7 g/dL (ref 6.0–8.3)

## 2013-01-12 NOTE — Progress Notes (Signed)
EKG 05/26/12 on EPIC, Chest x-ray 05/26/12 on EPIC, perioperative prescription for implanted cardiac device form Dr. Ladona Ridgel on chart

## 2013-01-12 NOTE — Patient Instructions (Signed)
20 [name]  01/12/2013   Your procedure is scheduled on: 01/19/13  Report to [facility] at 11:00 AM.  Call this number if you have problems the morning of surgery [phone]: [phone]   Remember: please follow clear liquid diet for 24 hours   Do not eat food or drink liquids After Midnight.     Take these medicines the morning of surgery with A SIP OF WATER: amiodarone, carvedilol, levothyroxine   Do not wear jewelry, make-up or nail polish.  Do not wear lotions, powders, or perfumes. You may wear deodorant.  Do not shave 48 hours prior to surgery. Men may shave face and neck.  Do not bring valuables to the hospital.  Contacts, dentures or bridgework may not be worn into surgery.  Leave suitcase in the car. After surgery it may be brought to your room.  For patients admitted to the hospital, checkout time is 11:00 AM the day of discharge.    Please read over the following fact sheets that you were given: incentive spirometry fact sheet, blood fact sheet Birdie Sons, RN  pre op nurse call if needed [phone]    FAILURE TO FOLLOW THESE INSTRUCTIONS MAY RESULT IN CANCELLATION OF YOUR SURGERY   Patient Signature: ___________________________________________

## 2013-01-18 ENCOUNTER — Telehealth: Payer: Self-pay | Admitting: Gynecologic Oncology

## 2013-01-18 NOTE — Telephone Encounter (Signed)
Called to speak with patient about pre-operative status.  Message left.  Instructed to take in clear liquids only this am with plans to begin bowel prep in at 10 am today.  Instructed to call for any needs or concerns.

## 2013-01-19 ENCOUNTER — Inpatient Hospital Stay (HOSPITAL_COMMUNITY)
Admission: RE | Admit: 2013-01-19 | Discharge: 2013-01-25 | DRG: 573 | Disposition: A | Discharge status: Home Health | Payer: BC Managed Care – PPO | Source: Ambulatory Visit | Attending: Gynecology | Admitting: Gynecology

## 2013-01-19 ENCOUNTER — Encounter (HOSPITAL_COMMUNITY)
Admission: RE | Disposition: A | Discharge status: Home Health | Payer: Self-pay | Source: Ambulatory Visit | Attending: Gynecology

## 2013-01-19 ENCOUNTER — Inpatient Hospital Stay (HOSPITAL_COMMUNITY): Payer: BC Managed Care – PPO | Admitting: Anesthesiology

## 2013-01-19 ENCOUNTER — Encounter (HOSPITAL_COMMUNITY): Payer: Self-pay | Admitting: *Deleted

## 2013-01-19 ENCOUNTER — Encounter (HOSPITAL_COMMUNITY): Payer: Self-pay | Admitting: Anesthesiology

## 2013-01-19 DIAGNOSIS — E7800 Pure hypercholesterolemia, unspecified: Secondary | ICD-10-CM | POA: Diagnosis present

## 2013-01-19 DIAGNOSIS — D371 Neoplasm of uncertain behavior of stomach: Secondary | ICD-10-CM | POA: Diagnosis present

## 2013-01-19 DIAGNOSIS — I5020 Unspecified systolic (congestive) heart failure: Secondary | ICD-10-CM | POA: Diagnosis present

## 2013-01-19 DIAGNOSIS — N736 Female pelvic peritoneal adhesions (postinfective): Secondary | ICD-10-CM | POA: Diagnosis present

## 2013-01-19 DIAGNOSIS — E039 Hypothyroidism, unspecified: Secondary | ICD-10-CM | POA: Diagnosis present

## 2013-01-19 DIAGNOSIS — Z95810 Presence of automatic (implantable) cardiac defibrillator: Secondary | ICD-10-CM

## 2013-01-19 DIAGNOSIS — I2510 Atherosclerotic heart disease of native coronary artery without angina pectoris: Secondary | ICD-10-CM | POA: Diagnosis present

## 2013-01-19 DIAGNOSIS — K639 Disease of intestine, unspecified: Secondary | ICD-10-CM | POA: Diagnosis present

## 2013-01-19 DIAGNOSIS — C569 Malignant neoplasm of unspecified ovary: Secondary | ICD-10-CM

## 2013-01-19 DIAGNOSIS — K669 Disorder of peritoneum, unspecified: Secondary | ICD-10-CM | POA: Diagnosis present

## 2013-01-19 DIAGNOSIS — D3910 Neoplasm of uncertain behavior of unspecified ovary: Principal | ICD-10-CM | POA: Diagnosis present

## 2013-01-19 DIAGNOSIS — D62 Acute posthemorrhagic anemia: Secondary | ICD-10-CM | POA: Diagnosis not present

## 2013-01-19 DIAGNOSIS — I252 Old myocardial infarction: Secondary | ICD-10-CM

## 2013-01-19 DIAGNOSIS — I509 Heart failure, unspecified: Secondary | ICD-10-CM | POA: Diagnosis present

## 2013-01-19 DIAGNOSIS — N2889 Other specified disorders of kidney and ureter: Secondary | ICD-10-CM | POA: Diagnosis present

## 2013-01-19 DIAGNOSIS — Z96649 Presence of unspecified artificial hip joint: Secondary | ICD-10-CM

## 2013-01-19 DIAGNOSIS — D375 Neoplasm of uncertain behavior of rectum: Secondary | ICD-10-CM | POA: Diagnosis present

## 2013-01-19 DIAGNOSIS — Z9221 Personal history of antineoplastic chemotherapy: Secondary | ICD-10-CM

## 2013-01-19 HISTORY — PX: LAPAROTOMY: SHX154

## 2013-01-19 LAB — HEMOGLOBIN AND HEMATOCRIT, BLOOD
HCT: 25.3 % — ABNORMAL LOW (ref 36.0–46.0)
Hemoglobin: 8.5 g/dL — ABNORMAL LOW (ref 12.0–15.0)

## 2013-01-19 SURGERY — LAPAROTOMY, EXPLORATORY
Anesthesia: General | Site: Abdomen | Wound class: Clean Contaminated

## 2013-01-19 MED ORDER — PROPOFOL 10 MG/ML IV BOLUS
INTRAVENOUS | Status: DC | PRN
  Administered 2013-01-19: 170 mg via INTRAVENOUS

## 2013-01-19 MED ORDER — BUPIVACAINE LIPOSOME 1.3 % IJ SUSP
20.0000 mL | Freq: Once | INTRAMUSCULAR | Status: DC
  Filled 2013-01-19: qty 20

## 2013-01-19 MED ORDER — CEFAZOLIN SODIUM-DEXTROSE 2-3 GM-% IV SOLR
2.0000 g | INTRAVENOUS | Status: AC
  Administered 2013-01-19: 2 g via INTRAVENOUS

## 2013-01-19 MED ORDER — INDIGOTINDISULFONATE SODIUM 8 MG/ML IJ SOLN
INTRAMUSCULAR | Status: DC | PRN
  Administered 2013-01-19: 5 mL via INTRAVENOUS

## 2013-01-19 MED ORDER — ZOLPIDEM TARTRATE 5 MG PO TABS
5.0000 mg | ORAL_TABLET | Freq: Every evening | ORAL | Status: DC | PRN
  Administered 2013-01-23 – 2013-01-24 (×3): 5 mg via ORAL
  Filled 2013-01-19 (×3): qty 1

## 2013-01-19 MED ORDER — LACTATED RINGERS IV SOLN: INTRAVENOUS | Status: DC

## 2013-01-19 MED ORDER — NALOXONE HCL 0.4 MG/ML IJ SOLN: 0.4000 mg | INTRAMUSCULAR | Status: DC | PRN

## 2013-01-19 MED ORDER — DIPHENHYDRAMINE HCL 12.5 MG/5ML PO ELIX: 12.5000 mg | ORAL_SOLUTION | Freq: Four times a day (QID) | ORAL | Status: DC | PRN

## 2013-01-19 MED ORDER — CISATRACURIUM BESYLATE (PF) 10 MG/5ML IV SOLN
INTRAVENOUS | Status: DC | PRN
  Administered 2013-01-19: 2 mg via INTRAVENOUS
  Administered 2013-01-19: 10 mg via INTRAVENOUS
  Administered 2013-01-19: 6 mg via INTRAVENOUS
  Administered 2013-01-19: 4 mg via INTRAVENOUS
  Administered 2013-01-19: 2 mg via INTRAVENOUS
  Administered 2013-01-19 (×2): 1 mg via INTRAVENOUS

## 2013-01-19 MED ORDER — LACTATED RINGERS IV SOLN
INTRAVENOUS | Status: DC
  Administered 2013-01-19 (×2): via INTRAVENOUS
  Administered 2013-01-19: 1000 mL via INTRAVENOUS
  Administered 2013-01-19: 15:00:00 via INTRAVENOUS

## 2013-01-19 MED ORDER — ONDANSETRON HCL 4 MG/2ML IJ SOLN
INTRAMUSCULAR | Status: DC | PRN
  Administered 2013-01-19: 4 mg via INTRAVENOUS

## 2013-01-19 MED ORDER — ACETAMINOPHEN 10 MG/ML IV SOLN
1000.0000 mg | Freq: Four times a day (QID) | INTRAVENOUS | Status: AC
  Administered 2013-01-19 – 2013-01-20 (×3): 1000 mg via INTRAVENOUS
  Filled 2013-01-19 (×3): qty 100

## 2013-01-19 MED ORDER — ENOXAPARIN SODIUM 40 MG/0.4ML ~~LOC~~ SOLN
40.0000 mg | SUBCUTANEOUS | Status: AC
Start: 2013-01-19 — End: 2013-01-19
  Administered 2013-01-19: 40 mg via SUBCUTANEOUS
  Filled 2013-01-19: qty 0.4

## 2013-01-19 MED ORDER — GLYCOPYRROLATE 0.2 MG/ML IJ SOLN
INTRAMUSCULAR | Status: DC | PRN
  Administered 2013-01-19: .6 mg via INTRAVENOUS

## 2013-01-19 MED ORDER — CEFAZOLIN SODIUM-DEXTROSE 2-3 GM-% IV SOLR
2.0000 g | Freq: Once | INTRAVENOUS | Status: AC
  Administered 2013-01-19: 2 g via INTRAVENOUS
  Filled 2013-01-19 (×2): qty 50

## 2013-01-19 MED ORDER — FENTANYL CITRATE 0.05 MG/ML IJ SOLN
INTRAMUSCULAR | Status: DC | PRN
  Administered 2013-01-19: 50 ug via INTRAVENOUS
  Administered 2013-01-19 (×2): 25 ug via INTRAVENOUS
  Administered 2013-01-19: 50 ug via INTRAVENOUS
  Administered 2013-01-19: 100 ug via INTRAVENOUS

## 2013-01-19 MED ORDER — HYDROMORPHONE HCL PF 1 MG/ML IJ SOLN
0.2500 mg | INTRAMUSCULAR | Status: DC | PRN
  Administered 2013-01-19: 0.5 mg via INTRAVENOUS
  Administered 2013-01-19: 0.25 mg via INTRAVENOUS

## 2013-01-19 MED ORDER — AMIODARONE HCL 100 MG PO TABS
100.0000 mg | ORAL_TABLET | Freq: Every morning | ORAL | Status: DC
  Administered 2013-01-20 – 2013-01-25 (×6): 100 mg via ORAL
  Filled 2013-01-19 (×7): qty 1

## 2013-01-19 MED ORDER — DEXTROSE 5 % IV SOLN
20.0000 ug/min | INTRAVENOUS | Status: DC
  Administered 2013-01-19: 20 ug/min via INTRAVENOUS
  Filled 2013-01-19: qty 1

## 2013-01-19 MED ORDER — PROMETHAZINE HCL 25 MG/ML IJ SOLN
6.2500 mg | Freq: Once | INTRAMUSCULAR | Status: AC
  Administered 2013-01-19: 6.25 mg via INTRAVENOUS

## 2013-01-19 MED ORDER — PROMETHAZINE HCL 25 MG/ML IJ SOLN
6.2500 mg | INTRAMUSCULAR | Status: DC | PRN
  Administered 2013-01-19: 6.25 mg via INTRAVENOUS

## 2013-01-19 MED ORDER — PROMETHAZINE HCL 25 MG/ML IJ SOLN: 6.2500 mg | Freq: Four times a day (QID) | INTRAMUSCULAR | Status: DC | PRN

## 2013-01-19 MED ORDER — 0.9 % SODIUM CHLORIDE (POUR BTL) OPTIME
TOPICAL | Status: DC | PRN
  Administered 2013-01-19: 2000 mL

## 2013-01-19 MED ORDER — PHENYLEPHRINE HCL 10 MG/ML IJ SOLN
10.0000 mg | INTRAVENOUS | Status: DC | PRN
  Administered 2013-01-19: 15 ug/min via INTRAVENOUS

## 2013-01-19 MED ORDER — OXYCODONE-ACETAMINOPHEN 5-325 MG PO TABS
1.0000 | ORAL_TABLET | ORAL | Status: DC | PRN
  Administered 2013-01-21 – 2013-01-25 (×14): 1 via ORAL
  Filled 2013-01-19 (×14): qty 1

## 2013-01-19 MED ORDER — DIPHENHYDRAMINE HCL 50 MG/ML IJ SOLN: 12.5000 mg | Freq: Four times a day (QID) | INTRAMUSCULAR | Status: DC | PRN

## 2013-01-19 MED ORDER — CARVEDILOL 12.5 MG PO TABS
12.5000 mg | ORAL_TABLET | Freq: Two times a day (BID) | ORAL | Status: DC
  Administered 2013-01-20 – 2013-01-25 (×8): 12.5 mg via ORAL
  Filled 2013-01-19 (×13): qty 1

## 2013-01-19 MED ORDER — SODIUM CHLORIDE 0.9 % IJ SOLN
INTRAMUSCULAR | Status: DC | PRN
  Administered 2013-01-19: 17:00:00

## 2013-01-19 MED ORDER — NEOSTIGMINE METHYLSULFATE 1 MG/ML IJ SOLN
INTRAMUSCULAR | Status: DC | PRN
  Administered 2013-01-19: 4 mg via INTRAVENOUS

## 2013-01-19 MED ORDER — DEXAMETHASONE SODIUM PHOSPHATE 10 MG/ML IJ SOLN
INTRAMUSCULAR | Status: DC | PRN
  Administered 2013-01-19: 10 mg via INTRAVENOUS

## 2013-01-19 MED ORDER — HYDROMORPHONE 0.3 MG/ML IV SOLN
INTRAVENOUS | Status: DC
  Administered 2013-01-19: 22:00:00 via INTRAVENOUS
  Administered 2013-01-20: 0.3 mg via INTRAVENOUS
  Administered 2013-01-20: 09:00:00 via INTRAVENOUS
  Administered 2013-01-20: 1.8 mg via INTRAVENOUS
  Administered 2013-01-20: 3.9 mg via INTRAVENOUS
  Administered 2013-01-20 (×2): 1.5 mg via INTRAVENOUS
  Administered 2013-01-20: 2.1 mg via INTRAVENOUS
  Administered 2013-01-21: 1.5 mg via INTRAVENOUS
  Administered 2013-01-21 (×2): 0.9 mg via INTRAVENOUS
  Administered 2013-01-21: 1.5 mg via INTRAVENOUS
  Administered 2013-01-21: 0.3 mg via INTRAVENOUS
  Filled 2013-01-19 (×3): qty 25

## 2013-01-19 MED ORDER — METRONIDAZOLE IN NACL 5-0.79 MG/ML-% IV SOLN
500.0000 mg | Freq: Three times a day (TID) | INTRAVENOUS | Status: DC
  Administered 2013-01-19 – 2013-01-23 (×9): 500 mg via INTRAVENOUS
  Filled 2013-01-19 (×18): qty 100

## 2013-01-19 MED ORDER — FENTANYL CITRATE 0.05 MG/ML IJ SOLN
25.0000 ug | INTRAMUSCULAR | Status: DC | PRN
  Administered 2013-01-19: 50 ug via INTRAVENOUS
  Administered 2013-01-19: 25 ug via INTRAVENOUS

## 2013-01-19 MED ORDER — LACTATED RINGERS IV SOLN
INTRAVENOUS | Status: DC | PRN
  Administered 2013-01-19: 15:00:00 via INTRAVENOUS

## 2013-01-19 MED ORDER — ONDANSETRON HCL 4 MG/2ML IJ SOLN
4.0000 mg | Freq: Once | INTRAMUSCULAR | Status: AC
  Administered 2013-01-19: 4 mg via INTRAVENOUS

## 2013-01-19 MED ORDER — ONDANSETRON HCL 4 MG/2ML IJ SOLN: 4.0000 mg | Freq: Four times a day (QID) | INTRAMUSCULAR | Status: DC | PRN

## 2013-01-19 MED ORDER — MIDAZOLAM HCL 5 MG/5ML IJ SOLN
INTRAMUSCULAR | Status: DC | PRN
  Administered 2013-01-19: 2 mg via INTRAVENOUS

## 2013-01-19 MED ORDER — ONDANSETRON HCL 4 MG PO TABS
4.0000 mg | ORAL_TABLET | Freq: Four times a day (QID) | ORAL | Status: DC | PRN
  Administered 2013-01-23 – 2013-01-25 (×2): 4 mg via ORAL
  Filled 2013-01-19 (×2): qty 1

## 2013-01-19 MED ORDER — KCL IN DEXTROSE-NACL 20-5-0.45 MEQ/L-%-% IV SOLN
INTRAVENOUS | Status: DC
  Administered 2013-01-19: 125 mL/h via INTRAVENOUS
  Administered 2013-01-20 – 2013-01-21 (×3): via INTRAVENOUS
  Filled 2013-01-19 (×11): qty 1000

## 2013-01-19 MED ORDER — PROMETHAZINE HCL 25 MG/ML IJ SOLN: 6.2500 mg | Freq: Once | INTRAMUSCULAR | Status: DC

## 2013-01-19 MED ORDER — SODIUM CHLORIDE 0.9 % IJ SOLN: 9.0000 mL | INTRAMUSCULAR | Status: DC | PRN

## 2013-01-19 SURGICAL SUPPLY — 70 items
ATTRACTOMAT 16X20 MAGNETIC DRP (DRAPES) ×2 IMPLANT
BAG URINE DRAINAGE (UROLOGICAL SUPPLIES) ×1 IMPLANT
BLADE EXTENDED COATED 6.5IN (ELECTRODE) ×1 IMPLANT
CANISTER SUCTION 2500CC (MISCELLANEOUS) ×3 IMPLANT
CATH FOLEY LATEX FREE 16FR (CATHETERS) ×1 IMPLANT
CHLORAPREP W/TINT 26ML (MISCELLANEOUS) ×2 IMPLANT
CLIP TI MEDIUM 6 (CLIP) ×1 IMPLANT
CLIP TI MEDIUM LARGE 6 (CLIP) ×9 IMPLANT
CLOTH BEACON ORANGE TIMEOUT ST (SAFETY) ×2 IMPLANT
COUNTER NEEDLE 20 DBL MAG RED (NEEDLE) ×1 IMPLANT
COVER MAYO STAND STRL (DRAPES) ×1 IMPLANT
COVER SURGICAL LIGHT HANDLE (MISCELLANEOUS) ×2 IMPLANT
DRAIN CHANNEL 19F RND (DRAIN) ×1 IMPLANT
DRAPE UTILITY XL STRL (DRAPES) ×2 IMPLANT
DRAPE WARM FLUID 44X44 (DRAPE) ×2 IMPLANT
DRSG OPSITE POSTOP 4X10 (GAUZE/BANDAGES/DRESSINGS) ×1 IMPLANT
DRSG TEGADERM 4X4.75 (GAUZE/BANDAGES/DRESSINGS) ×2 IMPLANT
ELECT BLADE 6.5 EXT (BLADE) ×2 IMPLANT
ELECT REM PT RETURN 9FT ADLT (ELECTROSURGICAL) ×2
ELECTRODE REM PT RTRN 9FT ADLT (ELECTROSURGICAL) ×1 IMPLANT
EVACUATOR SILICONE 100CC (DRAIN) ×1 IMPLANT
GAUZE SPONGE 4X4 16PLY XRAY LF (GAUZE/BANDAGES/DRESSINGS) ×2 IMPLANT
GLOVE BIOGEL M STRL SZ7.5 (GLOVE) ×5 IMPLANT
GLOVE BIOGEL PI IND STRL 7.5 (GLOVE) IMPLANT
GLOVE BIOGEL PI INDICATOR 7.5 (GLOVE) ×1
GLOVE SURG SS PI 6.5 STRL IVOR (GLOVE) ×2 IMPLANT
GLOVE SURG SS PI 7.5 STRL IVOR (GLOVE) ×4 IMPLANT
GOWN PREVENTION PLUS LG XLONG (DISPOSABLE) ×1 IMPLANT
GOWN STRL NON-REIN LRG LVL3 (GOWN DISPOSABLE) ×1 IMPLANT
GOWN STRL REIN XL XLG (GOWN DISPOSABLE) ×1 IMPLANT
GUIDEWIRE STR DUAL SENSOR (WIRE) ×1 IMPLANT
LIGASURE IMPACT 36 18CM CVD LR (INSTRUMENTS) ×1 IMPLANT
NS IRRIG 1000ML POUR BTL (IV SOLUTION) ×6 IMPLANT
PACK ABDOMINAL WL (CUSTOM PROCEDURE TRAY) ×2 IMPLANT
PENCIL BUTTON HOLSTER BLD 10FT (ELECTRODE) ×1 IMPLANT
SHEET LAVH (DRAPES) ×2 IMPLANT
SPONGE DRAIN TRACH 4X4 STRL 2S (GAUZE/BANDAGES/DRESSINGS) ×2 IMPLANT
SPONGE LAP 18X18 X RAY DECT (DISPOSABLE) ×7 IMPLANT
STAPLER CIRC CVD 29MM 37CM (STAPLE) ×1 IMPLANT
STAPLER CUT CVD 40MM GREEN (STAPLE) ×1 IMPLANT
STAPLER PROXIMATE 75MM BLUE (STAPLE) ×1 IMPLANT
STAPLER SKIN PROX WIDE 3.9 (STAPLE) ×2 IMPLANT
STAPLER VISISTAT 35W (STAPLE) ×1 IMPLANT
STENT CONTOUR 7FRX24 (STENTS) ×1 IMPLANT
SUT ETHILON 1 LR 30 (SUTURE) IMPLANT
SUT ETHILON 2 0 PS N (SUTURE) ×1 IMPLANT
SUT PDS AB 1 CTXB1 36 (SUTURE) ×4 IMPLANT
SUT PROLENE 2 0 BLUE (SUTURE) ×1 IMPLANT
SUT SILK 2 0 (SUTURE)
SUT SILK 2 0 30  PSL (SUTURE)
SUT SILK 2 0 30 PSL (SUTURE) IMPLANT
SUT SILK 2 0 SH CR/8 (SUTURE) ×1 IMPLANT
SUT SILK 2-0 18XBRD TIE 12 (SUTURE) ×1 IMPLANT
SUT VIC AB 0 CT1 36 (SUTURE) ×7 IMPLANT
SUT VIC AB 2-0 CT2 27 (SUTURE) ×16 IMPLANT
SUT VIC AB 2-0 SH 27 (SUTURE) ×6
SUT VIC AB 2-0 SH 27X BRD (SUTURE) ×6 IMPLANT
SUT VIC AB 3-0 CTX 36 (SUTURE) ×2 IMPLANT
SUT VIC AB 3-0 SH 27 (SUTURE) ×12
SUT VIC AB 3-0 SH 27X BRD (SUTURE) IMPLANT
SUT VICRYL 2 0 18  UND BR (SUTURE) ×1
SUT VICRYL 2 0 18 UND BR (SUTURE) ×1 IMPLANT
SYR 20CC LL (SYRINGE) ×1 IMPLANT
SYR 30ML LL (SYRINGE) ×1 IMPLANT
SYR BULB IRRIGATION 50ML (SYRINGE) ×1 IMPLANT
TOWEL OR 17X26 10 PK STRL BLUE (TOWEL DISPOSABLE) ×2 IMPLANT
TOWEL OR NON WOVEN STRL DISP B (DISPOSABLE) ×2 IMPLANT
TRAY FOLEY CATH 14FRSI W/METER (CATHETERS) ×2 IMPLANT
WATER STERILE IRR 1500ML POUR (IV SOLUTION) ×1 IMPLANT
YANKAUER SUCT BULB TIP 10FT TU (MISCELLANEOUS) ×1 IMPLANT

## 2013-01-19 NOTE — H&P (Signed)
[name] 52 y.o. female  Chief Complaint   Patient presents with   .  Recurrent Granulosa Cell tumor     New Consult   Assessment : Recurrent granulosa cell tumor.  Plan: The patient is admitted today to undergo debulking of recurrent granulosa cell tumor. CT scan demonstrates 3 dominant masses one involving the anterior abdominal wall and to deepen the pelvis. The risks of surgery including hemorrhage infection injury to adjacent viscera thromboembolic complications and anesthetic risks were reviewed with the patient and her cousin. She is aware that this could indeed require extensive resection including bowel surgery. Further, she will likely require postoperative chemotherapy. HPI: 52 year old white female seen in consultation request of Dr. Junie Panning regarding management of recurrent granulosa cell tumor. Patient first underwent surgery in 2007 vitamin stage I C. granulosa cell tumor. She received 3 cycles of bleomycin, etoposide, and cisplatin chemotherapy as an adjunct.  Recently the patient developed a lower nominal pain and noticed a "knot" in the region of her prior Pfannenstiel incision. A CT scan was obtained showing a large mass in the anterior abdominal wall as well as 2 other masses in the pelvis. The anterior mass measures 9.5 x 10 x 11.5 cm with cystic and solid components. In addition there 2 masses in the pelvis measuring 5.3 x 4.3 and 5.5 x 5.0 cm. A fine-needle biopsy of the mass is then obtained confirming recurrent granulosa cell tumor. The mass seems to extend into the anterior abdominal wall in the pelvis.  The patient denies any weight loss or any GI or GU symptoms.  Review of Systems:10 point review of systems is negative except as noted in interval history.  Vitals: Blood pressure 110/70, pulse 64, temperature 98.1 F (36.7 C), temperature source Oral, resp. rate 18, height 5' 6.34" (1.685 m), weight 98.113 kg (216 lb 4.8 oz).  Physical Exam:  General : The  patient is a healthy woman in no acute distress.  HEENT: normocephalic, extraoccular movements normal; neck is supple without thyromegally  Lynphnodes: Supraclavicular and inguinal nodes not enlarged  Abdomen: Soft, non-tender, no ascites, no organomegally, no her there is a palpable mass just anterior to the Pfannenstiel incision measuring approximately 3 cm. There is also fullness throughout the right lower quadrant of the abdomen consistent with the mass.nias  Pelvic:  EGBUS: Normal female  Vagina: Normal, no lesions  Urethra and Bladder: Normal, non-tender  Cervix: Surgically absent  Uterus: Surgically absent  Bi-manual examination: there is nodularity in the vaginal cuff and cul-de-sac.  Rectal: normal sphincter tone, no masses, no blood  Lower extremities: No edema or varicosities. Normal range of motion  Allergies   Allergen  Reactions   .  Latex  Hives, Rash and Other (See Comments)     LATEX TAPE "makes almost a raw spot"; tolerates latex gloves; tolerates paper tape   .  Prochlorperazine Edisylate  Other (See Comments)     Nervous/ flutter/ shakes    Past Medical History   Diagnosis  Date   .  Arrhythmia      ventricular tachycardia   .  Unspecified systolic heart failure    .  Ventricular fibrillation    .  Coronary artery disease    .  ICD (implantable cardiac defibrillator) in place    .  Pacemaker    .  High cholesterol    .  CHF (congestive heart failure)    .  Myocardial infarction  2011   .  Anginal pain    .  Hypothyroidism    .  Arthritis  02/06/2012     "everywhere"   .  Bladder cancer  2009     "injected medicine to get rid of it"   .  Granulosa cell carcinoma of ovary  2007     right; "had chemo"    Past Surgical History   Procedure  Laterality  Date   .  Cardiac defibrillator placement   02/06/2012     "lead change"   .  Insert / replace / remove pacemaker   10/2009     DUAL-CHAMBER St. JUDE DEFIBRILLATOR   .  Tonsillectomy and adenoidectomy   ~ 1967    .  Appendectomy   1989   .  Vaginal hysterectomy   2001   .  Tubal ligation   1993   .  Bilateral oophorectomy   2007   .  Mitral valve replacement   2011     "pig valve"   .  Cardiac valve replacement     .  Transurethral resection of bladder   2009     "for bladder cancer"   .  Thymus removed     .  Total hip arthroplasty   05/06/2012     Procedure: TOTAL HIP ARTHROPLASTY; Surgeon: Loanne Drilling, MD; Location: [facility]; Service: Orthopedics; Laterality: Right;    Current Outpatient Prescriptions   Medication  Sig  Dispense  Refill   .  amiodarone (PACERONE) 200 MG tablet  Take 100 mg by mouth every morning. 1/2 tablet by mouth daily     .  aspirin 325 MG tablet  Take 325 mg by mouth daily.     Marland Kitchen  atorvastatin (LIPITOR) 40 MG tablet  Take 40 mg by mouth every evening.     .  carvedilol (COREG) 12.5 MG tablet  Take 12.5 mg by mouth 2 (two) times daily with a meal.     .  cholecalciferol (VITAMIN D) 1000 UNITS tablet  Take 1,000 Units by mouth 2 (two) times daily.     Marland Kitchen  docusate sodium (COLACE) 100 MG capsule  Take 200-300 mg by mouth daily as needed for constipation.     .  fish oil-omega-3 fatty acids 1000 MG capsule  Take 1 g by mouth 2 (two) times daily.     Marland Kitchen  HYDROcodone-acetaminophen (NORCO/VICODIN) 5-325 MG per tablet  Take 1-2 tablets, IF NEEDED, for pain every 4-6 hrs.  90 tablet  2   .  levothyroxine (SYNTHROID, LEVOTHROID) 75 MCG tablet  Take 75 mcg by mouth daily before breakfast.     .  Multiple Vitamins-Minerals (MULTIVITAMIN PO)  Take by mouth every morning.     Marland Kitchen  spironolactone (ALDACTONE) 25 MG tablet  Take 25 mg by mouth 2 (two) times daily.      No current facility-administered medications for this visit.    History    Social History   .  Marital Status:  Divorced     Spouse Name:  N/A     Number of Children:  N/A   .  Years of Education:  N/A    Occupational History   .       WORKS FULL TIME    Social History Main Topics   .  Smoking status:  Former  Smoker -- 1.50 packs/day for 20 years     Types:  Cigarettes     Quit date:  10/22/2009   .  Smokeless tobacco:  Never Used   .  Alcohol Use:  No   .  Drug Use:  No   .  Sexually Active:  Not Currently    Other Topics  Concern   .  Not on file    Social History Narrative    WORKS FULL TIME    SINGLE    TOBACCO USE-YES    IMPLANTATION OF DUAL-CHAMBER St[employer] JUDE DEFIBRILLATOR   History reviewed. No pertinent family history.  Jeannette Corpus, MD

## 2013-01-19 NOTE — Op Note (Signed)
[name]  female MEDICAL RECORD [id number] DATE OF BIRTH: [birth year] PHYSICIAN: De Blanch, M.D  01/19/2013   OPERATIVE REPORT  PREOPERATIVE DIAGNOSIS: Recurrent granulosa cell tumor of the ovary  POSTOPERATIVE DIAGNOSIS: Same  PROCEDURE: Radical debulking of recurrent ovarian cancer including rectosigmoid resection with low rectal anastomosis, resection of right distal ureter. Ureteroneocystostomy with psoas hitch, omentectomy  SURGEON: De Blanch, M.D ASSISTANT: Antionette Char, MD ANESTHESIA: Gen. with oral tracheal tube ESTIMATED BLOOD LOSS: 1600 mL  SURGICAL FINDINGS: At exploratory laparotomy the patient's upper abdomen was free of disease. She had a 9 cm mass adherent to the anterior parietal peritoneum which had previously been biopsied. There were extensive adhesions of the sigmoid colon in the pelvis to the pelvic sidewall and bladder and folded upon itself. Once these adhesions were lysed there were cystic masses granulosa cell tumor encasing the rectosigmoid colon and distal right ureter.  The completion surgical procedure there was no gross residual disease.  PROCEDURE: Patient is brought to the operating room and the defibrillator inactivated. After satisfactory attainment of general anesthesia was placed in a modified lithotomy position in [place] stirrups. The anterior abdominal wall, perineum, and vagina were prepped, a Foley catheter was inserted, and the patient was draped. Surgical timeout was taken. The abdomen was entered through midline incision extending slightly above the umbilicus. The tumor adherent to the anterior bowel wall was dissected free from the peritoneal attachments and resected completely. The incision was extended so as to allow good exposure the pelvis. A Bookwalter retractor was positioned. Extensive lysis of adhesions was undertaken to free the sigmoid colon from its attachments to the bladder pelvic sidewall and onto  itself. Once this was accomplished we encountered cystic ovarian neoplasms which were very vascular and considerable bleeding ensued. In order to completely resect the gross tumor which was involving the sigmoid colon, it was elected to perform a rectosigmoid resection.  The pelvic sidewalls were opened identifying the ureter and vessels. The ureter was dissected free from its peritoneal attachments and the incision of peritoneum was extended medially. The presacral space was opened. The sigmoid colon was divided using a GIA stapler. The remainder the sigmoid mesentery was divided using the LigaSure. We further dissected in the presacral space. Sigmoid colon was freed from its attachments to the bladder. The apex of the vagina was identified and incised. The anterior vagina was opened so as to view the posterior vagina. The posterior vagina was incised and the rectovaginal septum was created. The pararectal fat was then cleared from the rectal muscularis using the LigaSure and cautery. Once the rectum was isolated it was transected below the tumor using the contour stapling device. The rectosigmoid colon and all pelvic tumor was handed off the operative field as a single specimen.  The vaginal cuff was closed with a running locking suture of #0 Vicryl. The proximal sigmoid colon was opened and sized to a 29 mm caliper. A pursestring suture was placed around the distal sigmoid colon and the EEA anvil was inserted in the proximal sigmoid colon and the pursestring suture tied. The EEA stapler was then inserted through the anus up to the staple line of the rectum. The trocar was deployed and the proximal: Brought into proximity with the distal rectum. The stapler was fired opened and then removed. There were 2 complete doughnuts of rectum identified and submitted to pathology a surgical margins. A bubble test was then performed. Sigmoidoscope was inserted into the rectum and the pelvis was filled with saline.  The  sigmoid colon was insufflated and found to be airtight. The anastomosis was then inspected and no weakness was noted either. Gloves and gowns were then changed and a new set of instruments were employed.  Attention was then turned to reimplanting the right ureter which had been resected en bloc with the rectosigmoid colon and pelvic tumor. An incision was made in the dome of the bladder and the retropubic space opened allowing mobility of the bladder toward the right pelvic sidewall. 2 sutures were placed in the bladder and attached to the bladder to the psoas muscle (psoas hitch). The ureter was "freshened" up, spatulated, and brought into the bladder through a stab incision in the posterior dome of the bladder. A #7 [place] double J stent was placed in the ureter and up into the kidney. A mucosa mucosa anastomosis was then created using 3-0 Vi.cryl suture. An additional sutureTo approximate the external ureter to the bladder. The pelvis was irrigated and found to be hemostatic. A Blake drain was placed through a stab incision in the right lower quadrant and entered into the presacral space. This was sutured to the skin with 2-0 nylon.  An infracolic omentectomy was then performed using the LigaSure. The pelvis and upper abdomen revealed reinspected and found to be hemostatic. No other sites of tumor were identified.  The anterior abdominal wall was closed in layers the first being a running mass closure using #1 PDS. Subcutaneous tissue was irrigated and experl was injected into the subcutaneous cutaneous layer. Subcutaneous tissue reapproximated with interrupted 3-0 Vicryl sutures. The skin was closed skin staples. A dressing was applied the patient was awakened from anesthesia and taken to the recovery room in satisfactory condition. Sponge needle and isthmic counts correct x2.   De Blanch, M.D

## 2013-01-19 NOTE — Interval H&P Note (Signed)
History and Physical Interval Note:  01/19/2013 12:15 PM  [name]  has presented today for surgery, with the diagnosis of Recurrent Granulosa Cell Tumor  The various methods of treatment have been discussed with the patient and family. After consideration of risks, benefits and other options for treatment, the patient has consented to  Procedure(s): TUMOR DEBULKING POSSIBLE BOWEL RESECTION (N/A) as a surgical intervention .  The patient's history has been reviewed, patient examined, no change in status, stable for surgery.  I have reviewed the patient's chart and labs.  Questions were answered to the patient's satisfaction.     CLARKE-PEARSON,Sharaine Delange L

## 2013-01-19 NOTE — Progress Notes (Signed)
Neo restarted at 48mcg/min  For BP 79/51 per Dr Rica Mast.  H and h sent. Promethazine given for nausea per Dr Rica Mast.

## 2013-01-19 NOTE — Progress Notes (Signed)
PACU NOTE:  Dr Rica Mast frequently  at bedside, for extended length of time.  Pt transferred to [address] after receiving 2 units PRBC's, neo gtt weaned off, pain controlled, , and nausea subsided.  Family visited briefly.

## 2013-01-19 NOTE — Transfer of Care (Signed)
Immediate Anesthesia Transfer of Care Note  Patient: [name]  Procedure(s) Performed: Procedure(s): TUMOR DEBULKING / BOWEL RESECTION/INSERTION RIGHT UTERERAL DOUBLE J STENT/OMENTECTOMY (N/A)  Patient Location: PACU  Anesthesia Type:General  Level of Consciousness: awake, oriented and sedated  Airway & Oxygen Therapy: Patient Spontanous Breathing and Patient connected to face mask oxygen  Post-op Assessment: Report given to PACU RN and Post -op Vital signs reviewed and stable  Post vital signs: Reviewed and stable  Complications: No apparent anesthesia complications

## 2013-01-19 NOTE — Anesthesia Preprocedure Evaluation (Signed)
Anesthesia Evaluation  Patient identified by MRN, date of birth, ID band Patient awake    Reviewed: Allergy & Precautions, H&P , NPO status , Patient's Chart, lab work & pertinent test results  Airway Mallampati: II TM Distance: >3 FB Neck ROM: Full    Dental no notable dental hx. (+) Teeth Intact and Dental Advisory Given,    Pulmonary neg pulmonary ROS, pneumonia -, Current Smoker,  breath sounds clear to auscultation  Pulmonary exam normal       Cardiovascular Exercise Tolerance: Good hypertension, Pt. on home beta blockers + angina + CAD, + Past MI and +CHF + dysrhythmias Ventricular Tachycardia + pacemaker + Cardiac Defibrillator Rhythm:Regular Rate:Normal  S/P MI and CABG in 2011. Postop V tach requiring an AICD. Recent lead replacement in August 2013. AICD has never fired. Prescription on chart from Dr. Ladona Ridgel. No intervention required today.  Dr. Mendel Ryder is regular cardiologist. No recent angina.   Neuro/Psych negative neurological ROS  negative psych ROS   GI/Hepatic negative GI ROS, Neg liver ROS, GERD-  ,  Endo/Other  Hypothyroidism   Renal/GU negative Renal ROS  negative genitourinary   Musculoskeletal negative musculoskeletal ROS (+)   Abdominal (+) + obese,   Peds  Hematology negative hematology ROS (+)   Anesthesia Other Findings   Reproductive/Obstetrics negative OB ROS                           Anesthesia Physical Anesthesia Plan  ASA: III  Anesthesia Plan: General   Post-op Pain Management:    Induction: Intravenous  Airway Management Planned: Oral ETT  Additional Equipment:   Intra-op Plan:   Post-operative Plan: Extubation in OR  Informed Consent: I have reviewed the patients History and Physical, chart, labs and discussed the procedure including the risks, benefits and alternatives for the proposed anesthesia with the patient or authorized representative who  has indicated his/her understanding and acceptance.   Dental advisory given  Plan Discussed with: CRNA  Anesthesia Plan Comments:         Anesthesia Quick Evaluation

## 2013-01-19 NOTE — Progress Notes (Signed)
Dr Rica Mast at bedsdie, ordered PRBC's to infuse at 500 mls/Hr.  Orders received for nausea.

## 2013-01-19 NOTE — Progress Notes (Signed)
Dr Tamela Oddi aware of H and h results and pt receiving 2 units of blood per Dr Rica Mast.

## 2013-01-19 NOTE — Anesthesia Postprocedure Evaluation (Signed)
  Anesthesia Post-op Note  Patient: [name]  Procedure(s) Performed: Procedure(s): TUMOR DEBULKING / BOWEL RESECTION/INSERTION RIGHT UTERERAL DOUBLE J STENT/OMENTECTOMY (N/A)  Patient Location: PACU  Anesthesia Type:General  Level of Consciousness: awake, alert  and oriented  Airway and Oxygen Therapy: Patient Spontanous Breathing and Patient connected to nasal cannula oxygen  Post-op Pain: moderate  Post-op Assessment: PATIENT'S CARDIOVASCULAR STATUS UNSTABLE  Post-op Vital Signs: Reviewed  Complications: Recovery complicated by hypotension in PACU,  secondary to intra-op EBL of 1600cc. Pressors and blood replacement given prior transfer ICU Stepdown.

## 2013-01-19 NOTE — Progress Notes (Signed)
BP dropped to 81/50 after phenergan and fentanyl given. H and H results.  Neo gtt increased to 40 mcg/min.  Orders received. Dr Rica Mast at bedside.

## 2013-01-20 ENCOUNTER — Encounter (HOSPITAL_COMMUNITY): Payer: Self-pay | Admitting: Gynecology

## 2013-01-20 DIAGNOSIS — D3910 Neoplasm of uncertain behavior of unspecified ovary: Secondary | ICD-10-CM | POA: Diagnosis present

## 2013-01-20 LAB — CBC
HCT: 30.8 % — ABNORMAL LOW (ref 36.0–46.0)
Hemoglobin: 10.7 g/dL — ABNORMAL LOW (ref 12.0–15.0)
MCH: 30.7 pg (ref 26.0–34.0)
MCHC: 34.7 g/dL (ref 30.0–36.0)
RBC: 3.48 MIL/uL — ABNORMAL LOW (ref 3.87–5.11)

## 2013-01-20 LAB — BASIC METABOLIC PANEL
BUN: 12 mg/dL (ref 6–23)
CO2: 19 mEq/L (ref 19–32)
Chloride: 101 mEq/L (ref 96–112)
Glucose, Bld: 159 mg/dL — ABNORMAL HIGH (ref 70–99)
Potassium: 4.5 mEq/L (ref 3.5–5.1)
Sodium: 133 mEq/L — ABNORMAL LOW (ref 135–145)

## 2013-01-20 LAB — MRSA PCR SCREENING: MRSA by PCR: NEGATIVE

## 2013-01-20 LAB — POCT I-STAT 4, (NA,K, GLUC, HGB,HCT)
Glucose, Bld: 133 mg/dL — ABNORMAL HIGH (ref 70–99)
HCT: 30 % — ABNORMAL LOW (ref 36.0–46.0)
Hemoglobin: 10.2 g/dL — ABNORMAL LOW (ref 12.0–15.0)

## 2013-01-20 MED ORDER — ADULT MULTIVITAMIN W/MINERALS CH
1.0000 | ORAL_TABLET | Freq: Every day | ORAL | Status: DC
  Administered 2013-01-22 – 2013-01-25 (×4): 1 via ORAL
  Filled 2013-01-20 (×7): qty 1

## 2013-01-20 MED ORDER — VITAMINS A & D EX OINT
TOPICAL_OINTMENT | CUTANEOUS | Status: AC
  Administered 2013-01-20: 04:00:00
  Filled 2013-01-20: qty 5

## 2013-01-20 NOTE — Progress Notes (Signed)
1 Day Post-Op Procedure(s) (LRB): TUMOR DEBULKING / BOWEL RESECTION/INSERTION RIGHT UTERERAL DOUBLE J STENT/OMENTECTOMY (N/A)  Subjective: Patient reports doing well post-operatively.  Reports the resolution of nausea from yesterday pm.  Requesting something to drink.  No emesis reported.  Minimal pain.  "Ready to get to the floor."  No concerns voiced.  Denies chest pain, dyspnea, dizziness, or passing flatus.   Objective: Vital signs in last 24 hours: Temp:  [97.6 F (36.4 C)-98.7 F (37.1 C)] 97.8 F (36.6 C) (07/30 0800) Pulse Rate:  [73-103] 81 (07/30 0800) Resp:  [4-18] 14 (07/30 0927) BP: (60-134)/(39-75) 92/66 mmHg (07/30 0800) SpO2:  [28 %-100 %] 28 % (07/30 0927) Weight:  [226 lb 13.7 oz (102.9 kg)] 226 lb 13.7 oz (102.9 kg) (07/29 2143) Last BM Date: 01/18/13  Intake/Output from previous day: 07/29 0701 - 07/30 0700 In: 7212.5 [I.V.:6225; Blood:637.5; IV Piggyback:350] Out: 3085 [Urine:1075; Drains:410; Blood:1600]  Physical Examination: General: alert, cooperative and no distress Resp: clear to auscultation bilaterally Cardio: regular rate and rhythm, S1, S2 normal, no murmur, click, rub or gallop GI: incision: midline dressing lightly stained, intact and dry, JP site dry and intact with minimal amount of serosanguinous drainage noted and abdomen soft, faint bowel sounds, non-tender Extremities: extremities normal, atraumatic, no cyanosis or edema Foley to straight drain, hematuria present  Labs: WBC/Hgb/Hct/Plts:  28.4/10.7/30.8/100 (07/30 0352) BUN/Cr/glu/ALT/AST/amyl/lip:  12/1.41/--/--/--/--/-- (07/30 1610)  Assessment: 52 y.o. s/p Procedure(s): TUMOR DEBULKING / BOWEL RESECTION/INSERTION RIGHT UTERERAL DOUBLE J STENT/OMENTECTOMY: stable Pain:  Pain is well-controlled on PRN medications.  Heme:  Hgb 10.7 and Hct 30.8 this am.  Post transfusion 2 units PRBCs yesterday evening due to acute blood loss related to surgery.  Continue to monitor.  ID: WBC 28.4 this  am.  On Flagyl 500 mg IV Q8H.  Continue to monitor.  CV:  History of CHF, CAD, pacemaker, MI.  Current treatment:  Amiodarone, Coreg.  GI:  Tolerating po: No: NPO    GU:  Urine output 1000 cc overnight.  Creat 1.41 this am.  Recheck in the am.    FEN:  Stable post-operatively.  Recheck labs in the am.  Prophylaxis: intermittent pneumatic compression boots.  Plan: Transfer to [facility] ambulation, IS use, deep breathing, and coughing Advance diet when +bowel sounds AM labs Continue post-operative plan of care   LOS: 1 day    [name] 01/20/2013, 11:16 AM

## 2013-01-20 NOTE — Progress Notes (Signed)
INITIAL NUTRITION ASSESSMENT  DOCUMENTATION CODES Per approved criteria  -Obesity Unspecified   INTERVENTION: Diet advancement per MD discretion Provide Multivitamin with minerals daily RD to provide protein supplements as needed  NUTRITION DIAGNOSIS: Inadequate oral intake related to inability to eat as evidenced by NPO status.   Goal: Pt to meet >/= 90% of their estimated nutrition needs   Monitor:  Diet advancement/PO intake Weight Labs  Reason for Assessment: Malnutrition Screening Tool, score of 2  52 y.o. female  Admitting Dx: Ovarian cancer  ASSESSMENT: 52 year old female presents for surgery, with the diagnosis of Recurrent Granulosa Cell Tumor. Pt has history of CHF, CAD, high cholesterol, and hypothyroidism. Pt is now s/p radical debulking of recurrent ovarian cancer including rectosigmoid resection with low rectal anastomosis, resection of right distal ureter. Ureteroneocystostomy with psoas hitch, omentectomy. Pt states the last solid food she has had was Sunday 7/27 due to being on clear liquids in preparation for surgery and current NPO status. Pt reports that PTA she didn't have much of an appetite and was eating 3 small meals and 2-3 snacks daily. Pt states her appetite is good today. Her usual body weight is 210 lbs; wt at admission was 215 lbs. Encouraged adequate PO intake once diet is advanced to promote healing from surgery.    Height: Ht Readings from Last 1 Encounters:  01/19/13 5\' 6"  (1.676 m)    Weight: Wt Readings from Last 1 Encounters:  01/19/13 226 lb 13.7 oz (102.9 kg)    Ideal Body Weight: 130 lbs  % Ideal Body Weight: 174%  Wt Readings from Last 10 Encounters:  01/19/13 226 lb 13.7 oz (102.9 kg)  01/19/13 226 lb 13.7 oz (102.9 kg)  01/12/13 215 lb (97.523 kg)  12/18/12 216 lb 4.8 oz (98.113 kg)  11/30/12 217 lb (98.431 kg)  11/18/12 217 lb (98.431 kg)  05/26/12 213 lb 9.6 oz (96.888 kg)  05/06/12 219 lb (99.338 kg)  05/06/12 219  lb (99.338 kg)  04/30/12 219 lb 2 oz (99.394 kg)    Usual Body Weight: 210 lbs  % Usual Body Weight: 108%  BMI:  Body mass index is 36.63 kg/(m^2).  Estimated Nutritional Needs: Kcal: 1950-2110 Protein: 115-130 grams Fluid: 2.6 L  Skin: incisions on abdomen and perineum  Diet Order: NPO  EDUCATION NEEDS: -No education needs identified at this time   Intake/Output Summary (Last 24 hours) at 01/20/13 1241 Last data filed at 01/20/13 0600  Gross per 24 hour  Intake 6987.5 ml  Output   3085 ml  Net 3902.5 ml    Last BM: 7/28  Labs:   Recent Labs Lab 01/19/13 1510 01/20/13 0352  NA 135 133*  K 4.8 4.5  CL  --  101  CO2  --  19  BUN  --  12  CREATININE  --  1.41*  CALCIUM  --  8.1*  GLUCOSE 133* 159*    CBG (last 3)  No results found for this basename: GLUCAP,  in the last 72 hours  Scheduled Meds: . amiodarone  100 mg Oral q morning - 10a  . carvedilol  12.5 mg Oral BID WC  . HYDROmorphone PCA 0.3 mg/mL   Intravenous Q4H  . metronidazole  500 mg Intravenous Q8H    Continuous Infusions: . dextrose 5 % and 0.45 % NaCl with KCl 20 mEq/L 125 mL/hr at 01/20/13 0837  . phenylephrine (NEO-SYNEPHRINE) Adult infusion Stopped (01/19/13 2011)    Past Medical History  Diagnosis Date  .  Arrhythmia     ventricular tachycardia  . Unspecified systolic heart failure   . Ventricular fibrillation   . Coronary artery disease   . ICD (implantable cardiac defibrillator) in place   . Pacemaker   . High cholesterol   . CHF (congestive heart failure)   . Myocardial infarction 2011  . Hypothyroidism   . Arthritis 02/06/2012    "everywhere"  . Bladder cancer 2009    "injected medicine to get rid of it"  . Granulosa cell carcinoma of ovary 2007    right; "had chemo"  . Pneumonia     as child  . GERD (gastroesophageal reflux disease)     Past Surgical History  Procedure Laterality Date  . Cardiac defibrillator placement  02/06/2012    "lead change"  . Insert /  replace / remove pacemaker  10/2009    DUAL-CHAMBER [employer] JUDE DEFIBRILLATOR  . Tonsillectomy and adenoidectomy  ~ 1967  . Appendectomy  1989  . Vaginal hysterectomy  2001  . Tubal ligation  1993  . Bilateral oophorectomy  2007  . Mitral valve replacement  2011    "pig valve"  . Cardiac valve replacement    . Transurethral resection of bladder  2009    "for bladder cancer"  . Thymus removed    . Total hip arthroplasty  05/06/2012    Procedure: TOTAL HIP ARTHROPLASTY;  Surgeon: Loanne Drilling, MD;  Location: [facility];  Service: Orthopedics;  Laterality: Right;    Ian Malkin RD, LDN Inpatient Clinical Dietitian Pager: [phone] After Hours Pager: [phone]

## 2013-01-20 NOTE — Progress Notes (Signed)
CARE MANAGEMENT NOTE 01/20/2013  Patient:  SADIA, BELFIORE   Account Number:  [id number]  Date Initiated:  01/20/2013  Documentation initiated by:  Tandy Grawe  Subjective/Objective Assessment:   had debulking surg done on 07292014,postop course unstable due to hypotensive episode and req iv pressors and bld products.     Action/Plan:   from home with family   Anticipated DC Date:  01/23/2013   Anticipated DC Plan:  HOME/SELF CARE  In-house referral  NA      DC Planning Services  NA      [facility] Choice  NA   Choice offered to / List presented to:  NA   DME arranged  NA      DME agency  NA     HH arranged  NA      HH agency  NA   Status of service:  In process, will continue to follow Medicare Important Message given?  NA - LOS <3 / Initial given by admissions (If response is "NO", the following Medicare IM given date fields will be blank) Date Medicare IM given:   Date Additional Medicare IM given:    Discharge Disposition:    Per UR Regulation:  Reviewed for med. necessity/level of care/duration of stay  If discussed at Long Length of Stay Meetings, dates discussed:    Comments:  [id number]/Jen Benedict Earlene Plater RN, BSN, CCN: [phone] Case management. Chart reviewed for discharge planning and present needs. Discharge needs: none present at time of review. Next chart review due:  [id number]

## 2013-01-21 LAB — CBC
HCT: 22.2 % — ABNORMAL LOW (ref 36.0–46.0)
HCT: 22.8 % — ABNORMAL LOW (ref 36.0–46.0)
Hemoglobin: 7.7 g/dL — ABNORMAL LOW (ref 12.0–15.0)
Hemoglobin: 7.8 g/dL — ABNORMAL LOW (ref 12.0–15.0)
MCH: 31.1 pg (ref 26.0–34.0)
MCHC: 34.2 g/dL (ref 30.0–36.0)
RBC: 2.49 MIL/uL — ABNORMAL LOW (ref 3.87–5.11)
RBC: 2.51 MIL/uL — ABNORMAL LOW (ref 3.87–5.11)
WBC: 20.9 10*3/uL — ABNORMAL HIGH (ref 4.0–10.5)

## 2013-01-21 LAB — BASIC METABOLIC PANEL
BUN: 9 mg/dL (ref 6–23)
CO2: 24 mEq/L (ref 19–32)
Chloride: 102 mEq/L (ref 96–112)
GFR calc non Af Amer: 58 mL/min — ABNORMAL LOW (ref >90)
Glucose, Bld: 115 mg/dL — ABNORMAL HIGH (ref 70–99)
Potassium: 4.2 mEq/L (ref 3.5–5.1)
Sodium: 134 mEq/L — ABNORMAL LOW (ref 135–145)

## 2013-01-21 LAB — PREPARE RBC (CROSSMATCH)

## 2013-01-21 NOTE — Progress Notes (Signed)
2 Days Post-Op Procedure(s) (LRB): TUMOR DEBULKING / BOWEL RESECTION/INSERTION RIGHT UTERERAL DOUBLE J STENT/OMENTECTOMY (N/A)  Subjective: Patient reports doing well post-operatively. No nausea or emesis reported.  Minimal pain.  Denies chest pain, dyspnea, dizziness, passing flatus, or having a bowel movement.  Requesting to have PCA discontinue so she could stop wearing the nasal cannula.  Stating that she can feel her tummy "rumbling."  No concerns voiced.   Objective: Vital signs in last 24 hours: Temp:  [98 F (36.7 C)-98.5 F (36.9 C)] 98 F (36.7 C) (07/31 0641) Pulse Rate:  [76-82] 79 (07/31 0641) Resp:  [11-22] 16 (07/31 0657) BP: (90-117)/(52-79) 91/54 mmHg (07/31 0641) SpO2:  [98 %-100 %] 100 % (07/31 0657) Last BM Date: 01/18/13  Intake/Output from previous day: 07/30 0701 - 07/31 0700 In: 3135.4 [I.V.:3035.4; IV Piggyback:100] Out: 3640 [Urine:3575; Drains:65]  Physical Examination: General: alert, cooperative and no distress Resp: clear to auscultation bilaterally Cardio: regular rate and rhythm, S1, S2 normal, no murmur, click, rub or gallop GI: incision: midline dressing lightly stained, intact and dry, JP site dry and intact with minimal amount of serosanguinous drainage noted and abdomen soft, hypoactive bowel sounds, non-tender Extremities: extremities normal, atraumatic, no cyanosis or edema Foley to straight drain, hematuria present  Labs: WBC/Hgb/Hct/Plts:  20.9/7.7/22.2/75 (07/31 0404) BUN/Cr/glu/ALT/AST/amyl/lip:  9/1.08/--/--/--/--/-- (07/31 0404)  Assessment: 52 y.o. s/p Procedure(s): TUMOR DEBULKING / BOWEL RESECTION/INSERTION RIGHT UTERERAL DOUBLE J STENT/OMENTECTOMY: stable Pain:  Pain is well-controlled on PRN medications.  Heme:  Hgb 7.7 and Hct 22.2 this am.  Post transfusion 2 units PRBCs on 01/19/13 evening due to acute blood loss related to surgery.  Continue to monitor.  Recheck labs later this am.  ID: WBC 20.4 this am.  On Flagyl 500 mg  IV Q8H.  Continue to monitor.  CV:  History of CHF, CAD, pacemaker, MI.  Current treatment:  Amiodarone, Coreg.  GI:  Tolerating po: No: NPO.    GU:  Urine output 3575 cc overnight.  Creat improved this am.  Recheck in the am.    FEN:  Stable post-operatively.  Recheck labs in the am.  Prophylaxis: intermittent pneumatic compression boots.  Plan: Sips of clear liquids If sips tolerated, plan to saline lock IV Recheck H&H later this am Encourage ambulation, IS use, deep breathing, and coughing AM labs Continue post-operative plan of care   LOS: 2 days    Alson Mcpheeters DEAL 01/21/2013, 10:25 AM   Update 01/21/13 at 17:30: Repeat CBC resulted Hgb of 7.8 and Hct 22.8.  Plan to transfuse two units PRBCs per Dr. Stanford Breed and Dr. Tamela Oddi due to acute blood loss related to surgery.  Patient informed of plan and verbal consent obtained.   Warner Mccreedy NP

## 2013-01-22 LAB — TYPE AND SCREEN
ABO/RH(D): AB POS
Unit division: 0
Unit division: 0

## 2013-01-22 LAB — CBC
HCT: 27.6 % — ABNORMAL LOW (ref 36.0–46.0)
Hemoglobin: 9.5 g/dL — ABNORMAL LOW (ref 12.0–15.0)
MCHC: 34.4 g/dL (ref 30.0–36.0)
MCV: 91.1 fL (ref 78.0–100.0)
RDW: 14.4 % (ref 11.5–15.5)

## 2013-01-22 LAB — BASIC METABOLIC PANEL
BUN: 5 mg/dL — ABNORMAL LOW (ref 6–23)
Chloride: 104 mEq/L (ref 96–112)
Creatinine, Ser: 1.06 mg/dL (ref 0.50–1.10)
GFR calc Af Amer: 68 mL/min — ABNORMAL LOW (ref >90)
GFR calc non Af Amer: 59 mL/min — ABNORMAL LOW (ref >90)
Glucose, Bld: 85 mg/dL (ref 70–99)
Potassium: 3.7 mEq/L (ref 3.5–5.1)

## 2013-01-22 NOTE — Progress Notes (Signed)
3 Days Post-Op Procedure(s) (LRB): TUMOR DEBULKING / BOWEL RESECTION/INSERTION RIGHT UTERERAL DOUBLE J STENT/OMENTECTOMY (N/A)  Subjective: Patient reports doing well post-operatively. Tolerating clear liquids.  No nausea or emesis reported.  Minimal pain.  Denies chest pain, dyspnea, dizziness, passing flatus, or having a bowel movement.  Tolerated transfusion of 2 units PRBCs well.  No concerns voiced.   Objective: Vital signs in last 24 hours: Temp:  [97.9 F (36.6 C)-99.3 F (37.4 C)] 97.9 F (36.6 C) (08/01 0605) Pulse Rate:  [77-89] 79 (08/01 0850) Resp:  [16-22] 18 (08/01 0605) BP: (90-113)/(48-69) 113/59 mmHg (08/01 0850) SpO2:  [97 %-100 %] 97 % (08/01 0605) Last BM Date: 01/18/13  Intake/Output from previous day: 07/31 0701 - 08/01 0700 In: 2815.1 [P.O.:240; I.V.:1540.1; Blood:535; IV Piggyback:500] Out: 3400 [Urine:3300; Drains:100]  Physical Examination: General: alert, cooperative and no distress Resp: clear to auscultation bilaterally Cardio: regular rate and rhythm, S1, S2 normal, no murmur, click, rub or gallop GI: incision: midline dressing lightly stained, intact and dry, JP site dry and intact with minimal amount of serosanguinous drainage noted and abdomen soft, hypoactive bowel sounds, non-tender Extremities: extremities normal, atraumatic, no cyanosis or edema Foley to straight drain, hematuria resolving, urine in the tubing clear and yellow  Labs: WBC/Hgb/Hct/Plts:  15.3/9.5/27.6/74 (08/01 0351) BUN/Cr/glu/ALT/AST/amyl/lip:  5/1.06/--/--/--/--/-- (08/01 0351)  Assessment: 51 y.o. s/p Procedure(s): TUMOR DEBULKING / BOWEL RESECTION/INSERTION RIGHT UTERERAL DOUBLE J STENT/OMENTECTOMY: stable Pain:  Pain is well-controlled on PRN medications.  Heme:  Hgb 9.5 and Hct 27.6 this am.  Post transfusion 2 units PRBCs on 01/19/13 evening and 01/21/13 due to acute blood loss related to surgery.  Continue to monitor.  Patient asymptomatic except for  hypotension.  ID: WBC 15.3 this am.  On Flagyl 500 mg IV Q8H.  Continue to monitor.  CV:  History of CHF, CAD, pacemaker, MI.  Current treatment:  Amiodarone, Coreg.  GI:  Tolerating po: Yes, clear liquids.    GU:  Urine output adequate.  Foley to stay in place at discharge.  FEN:  Stable post-operatively.    Prophylaxis: intermittent pneumatic compression boots.  Plan: Advance to low fiber diet Begin foley catheter teaching Encourage ambulation, IS use, deep breathing, and coughing Continue post-operative plan of care   LOS: 3 days    CROSS, MELISSA DEAL 01/22/2013, 11:02 AM   Update 01/21/13 at 17:30: Repeat CBC resulted Hgb of 7.8 and Hct 22.8.  Plan to transfuse two units PRBCs per Dr. Stanford Breed and Dr. Tamela Oddi due to acute blood loss related to surgery.  Patient informed of plan and verbal consent obtained.   Warner Mccreedy NP

## 2013-01-23 MED ORDER — METRONIDAZOLE 500 MG PO TABS
500.0000 mg | ORAL_TABLET | Freq: Three times a day (TID) | ORAL | Status: DC
  Filled 2013-01-23 (×3): qty 1

## 2013-01-23 MED ORDER — METRONIDAZOLE 500 MG PO TABS
500.0000 mg | ORAL_TABLET | Freq: Three times a day (TID) | ORAL | Status: DC
  Administered 2013-01-23 – 2013-01-25 (×4): 500 mg via ORAL
  Filled 2013-01-23 (×8): qty 1

## 2013-01-23 MED ORDER — METRONIDAZOLE IN NACL 5-0.79 MG/ML-% IV SOLN
500.0000 mg | Freq: Three times a day (TID) | INTRAVENOUS | Status: DC
  Administered 2013-01-23: 500 mg via INTRAVENOUS
  Filled 2013-01-23 (×2): qty 100

## 2013-01-23 NOTE — Progress Notes (Signed)
Dr. Clearance Coots in to see pt. MD aware pt's IV expired. See new orders received.

## 2013-01-23 NOTE — Progress Notes (Signed)
4 Days Post-Op Procedure(s) (LRB): TUMOR DEBULKING / BOWEL RESECTION/INSERTION RIGHT UTERERAL DOUBLE J STENT/OMENTECTOMY (N/A)  Subjective: Patient reports tolerating PO.    Objective: I have reviewed patient's vital signs, intake and output, medications and labs.  General: alert and no distress GI: normal findings: soft, non-tender Extremities: extremities normal, atraumatic, no cyanosis or edema Vaginal Bleeding: none  Assessment: s/p Procedure(s): TUMOR DEBULKING / BOWEL RESECTION/INSERTION RIGHT UTERERAL DOUBLE J STENT/OMENTECTOMY (N/A): stable and progressing well  Plan: Continue current care.  LOS: 4 days    Cletus Paris A 01/23/2013, 10:49 PM

## 2013-01-24 NOTE — Progress Notes (Signed)
5 Days Post-Op Procedure(s) (LRB): TUMOR DEBULKING / BOWEL RESECTION/INSERTION RIGHT UTERERAL DOUBLE J STENT/OMENTECTOMY (N/A)  Subjective: Patient reports tolerating PO.    Objective: I have reviewed patient's vital signs, intake and output, medications and labs.  General: alert and no distress GI: normal findings: soft, non-tender Extremities: extremities normal, atraumatic, no cyanosis or edema Vaginal Bleeding: none  Assessment: s/p Procedure(s): TUMOR DEBULKING / BOWEL RESECTION/INSERTION RIGHT UTERERAL DOUBLE J STENT/OMENTECTOMY (N/A): stable and progressing well  Plan: Continue current managemnt.  LOS: 5 days    HARPER,CHARLES A 01/24/2013, 7:27 AM

## 2013-01-25 MED ORDER — OXYCODONE-ACETAMINOPHEN 5-325 MG PO TABS: 1.0000 | ORAL_TABLET | ORAL | Status: DC | PRN

## 2013-01-25 MED ORDER — ENOXAPARIN (LOVENOX) PATIENT EDUCATION KIT
PACK | Freq: Once | Status: DC
  Filled 2013-01-25: qty 1

## 2013-01-25 MED ORDER — ENOXAPARIN SODIUM 40 MG/0.4ML ~~LOC~~ SOLN: 40.0000 mg | Freq: Every day | SUBCUTANEOUS | Status: DC

## 2013-01-25 MED ORDER — SILVER NITRATE-POT NITRATE 75-25 % EX MISC
2.0000 | Freq: Once | CUTANEOUS | Status: DC
Start: 2013-01-25 — End: 2013-01-25
  Filled 2013-01-25: qty 2

## 2013-01-25 NOTE — Care Management Note (Signed)
    Page 1 of 2   01/25/2013     1:13:22 PM   CARE MANAGEMENT NOTE 01/25/2013  Patient:  [name], [name]   Account Number:  [id number]  Date Initiated:  01/20/2013  Documentation initiated by:  DAVIS,RHONDA  Subjective/Objective Assessment:   had debulking surg done on 07292014,postop course unstable due to hypotensive episode and req iv pressors and bld products.     Action/Plan:   from home with family   Anticipated DC Date:  01/25/2013   Anticipated DC Plan:  HOME W HOME HEALTH SERVICES  In-house referral  NA      DC Planning Services  CM consult      [facility] Choice  HOME HEALTH   Choice offered to / List presented to:  C-1 Patient   DME arranged  NA      DME agency  NA     HH arranged  HH-1 RN      [facility] agency  [facility]   Status of service:  Completed, signed off Medicare Important Message given?  NA - LOS <3 / Initial given by admissions (If response is "NO", the following Medicare IM given date fields will be blank) Date Medicare IM given:   Date Additional Medicare IM given:    Discharge Disposition:  HOME W HOME HEALTH SERVICES  Per UR Regulation:  Reviewed for med. necessity/level of care/duration of stay  If discussed at Long Length of Stay Meetings, dates discussed:    Comments:  01-25-13 Stacy Ishihara RN CM 1311 Spoke with patient at bedside regarding need for [facility] RN for dressing changes. Patient had used [place] previously and wanted to use them again. Contacted [name] but they were unable to provide services due to lack of staffing. Patient then had no preference so contacted [facility] and they are able to provide services.  [id number] Stacy Plater RN, BSN, CCN: [phone] Case management. Chart reviewed for discharge planning and present needs. Discharge needs: none present at time of review. Next chart review due:  [id number]

## 2013-01-25 NOTE — Discharge Summary (Signed)
Physician Discharge Summary  Patient ID: [name] MRN: [id number] DOB/AGE: [birth year] 52 y.o.  Admit date: 01/19/2013 Discharge date: 01/25/2013  Admission Diagnoses: Granulosa cell tumor of ovary  Discharge Diagnoses:  Principal Problem:   Granulosa cell tumor of ovary  Discharged Condition:  The patient is in good condition and stable for discharge.   Hospital Course: On 01/19/2013, the patient underwent the following: Procedure(s):  TUMOR DEBULKING / BOWEL RESECTION/INSERTION RIGHT UTERERAL DOUBLE J STENT/OMENTECTOMY.  Postoperative course included transfusion of 2 units PRBCs on 01/19/13 and 01/21/13 due to acute blood loss related to surgery blood loss.  She was discharged to home on postoperative day 6 tolerating a regular diet and passing flatus.  23 staples removed from midline dressing and blake drain removed without difficulty.  Hematoma noted superficially under the incision and incision superficially opened by Dr. Tamela Oddi.  No active signs of bleeding or signs of infection noted.  Incision packed and dry dressing placed.  Case Management contacted for home health needs including daily dressing changes.  Catheter care discussed with patient returning demonstration without difficulty.  Verbalizing understanding.  Plans for patient to have cystogram outpatient in two weeks with possible catheter removal at that time per Dr. Stanford Breed.  Consults:  Case Management for home health needs.  Significant Diagnostic Studies: None  Treatments: antibiotics: metronidazole post-op, surgery: see above  Discharge Exam: Blood pressure 97/65, pulse 84, temperature 97.5 F (36.4 C), temperature source Oral, resp. rate 18, height 5\' 6"  (1.676 m), weight 226 lb 13.7 oz (102.9 kg), SpO2 94.00%. General appearance: alert, cooperative and no distress Resp: clear to auscultation bilaterally Cardio: regular rate and rhythm, S1, S2 normal, no murmur, click, rub or gallop GI: soft,  non-tender; bowel sounds normal; no masses,  no organomegaly Extremities: extremities normal, atraumatic, no cyanosis or edema Incision/Wound: Midline incision with staples, dressing clean, dry, and intact.  Blake drain site without drainage or erythema.  Drain and staples removed without difficulty.  Incision mildly oozy with dark red blood.  Hematoma noted superficially underneath the incision.  Incision superficially opened by Dr. Tamela Oddi and packed.  Disposition: 06-Home-Health Care Svc       Future Appointments Provider Department Dept Phone   02/12/2013 9:45 AM Jeannette Corpus, MD [facility] GYNECOLOGICAL ONCOLOGY [phone]       Medication List    ASK your doctor about these medications       amiodarone 200 MG tablet  Commonly known as:  PACERONE  Take 100 mg by mouth every morning. 1/2 tablet by mouth daily     aspirin 325 MG tablet  Take 325 mg by mouth daily.     atorvastatin 40 MG tablet  Commonly known as:  LIPITOR  Take 40 mg by mouth every evening.     carvedilol 12.5 MG tablet  Commonly known as:  COREG  Take 12.5 mg by mouth 2 (two) times daily with a meal.     cholecalciferol 1000 UNITS tablet  Commonly known as:  VITAMIN D  Take 1,000 Units by mouth 2 (two) times daily.     fish oil-omega-3 fatty acids 1000 MG capsule  Take 1 g by mouth 2 (two) times daily.     HYDROcodone-acetaminophen 5-325 MG per tablet  Commonly known as:  NORCO/VICODIN  TAKE 1-2 TABLET(S) BY MOUTH FOR PAIN EVERY 4-6 HOURS, IF NEEDED     levothyroxine 75 MCG tablet  Commonly known as:  SYNTHROID, LEVOTHROID  Take 75 mcg by mouth daily  before breakfast.     MULTIVITAMIN PO  Take by mouth every morning.     spironolactone 25 MG tablet  Commonly known as:  ALDACTONE  Take 25 mg by mouth 2 (two) times daily.       Follow-up Information   Follow up with Jeannette Corpus, MD On 02/12/2013. (at 9:45 am at the [facility])    Contact  information:   [address] [place] [address] [phone]       Greater than thirty minutes were spend for face to face discharge instructions and discharge orders/summary in EPIC.   Signed: Jlyn Bracamonte DEAL 01/25/2013, 11:08 AM

## 2013-01-27 ENCOUNTER — Telehealth: Payer: Self-pay | Admitting: Gynecologic Oncology

## 2013-01-27 DIAGNOSIS — Z960 Presence of urogenital implants: Secondary | ICD-10-CM

## 2013-01-27 DIAGNOSIS — C569 Malignant neoplasm of unspecified ovary: Secondary | ICD-10-CM

## 2013-01-27 NOTE — Telephone Encounter (Signed)
Patient called back to the office with complaints of brownish liquid in her foley catheter tubing.  No bright bleeding reported.  Denies vaginal bleeding.  Adequate PO intake reported.  Advised to monitor urine and call the office if bright red bleeding develops or if the brownish colored urine does not resolve.  Patient has a stent post surgery on 01/19/13.  Patient to have cystogram 2 weeks post-op- stent placed during surgery and foley in place.

## 2013-01-27 NOTE — Telephone Encounter (Signed)
Called to check on patient's current post-op status.  Stating that home health has been coming out for dressing changes.  No issues voiced.  Tolerating diet, passing flatus, and having bowel movements.  Follow up appointment with lab work arranged for this Friday at 11:15.  Instructed to call for any needs before that time.

## 2013-01-28 ENCOUNTER — Telehealth: Payer: Self-pay | Admitting: Gynecologic Oncology

## 2013-01-28 NOTE — Telephone Encounter (Signed)
Contacted patient to follow up on complaints of brownish hematuria reported yesterday.  Stating that her urine has lightened up with "just a light tint of brownish blood in the urine."  Increasing fluids.  Appointments confirmed for tomorrow am.

## 2013-01-29 ENCOUNTER — Other Ambulatory Visit: Payer: Self-pay | Admitting: Gynecologic Oncology

## 2013-01-29 ENCOUNTER — Other Ambulatory Visit (HOSPITAL_BASED_OUTPATIENT_CLINIC_OR_DEPARTMENT_OTHER): Payer: BC Managed Care – PPO | Admitting: Lab

## 2013-01-29 ENCOUNTER — Ambulatory Visit: Payer: BC Managed Care – PPO | Attending: Gynecologic Oncology | Admitting: Gynecologic Oncology

## 2013-01-29 ENCOUNTER — Encounter: Payer: Self-pay | Admitting: Gynecologic Oncology

## 2013-01-29 VITALS — BP 98/58 | HR 70 | Temp 98.2°F | Resp 16 | Ht 66.34 in | Wt 213.2 lb

## 2013-01-29 DIAGNOSIS — Z8543 Personal history of malignant neoplasm of ovary: Secondary | ICD-10-CM

## 2013-01-29 DIAGNOSIS — R1900 Intra-abdominal and pelvic swelling, mass and lump, unspecified site: Secondary | ICD-10-CM

## 2013-01-29 DIAGNOSIS — D72829 Elevated white blood cell count, unspecified: Secondary | ICD-10-CM

## 2013-01-29 DIAGNOSIS — G4700 Insomnia, unspecified: Secondary | ICD-10-CM

## 2013-01-29 DIAGNOSIS — C561 Malignant neoplasm of right ovary: Secondary | ICD-10-CM

## 2013-01-29 DIAGNOSIS — R8290 Unspecified abnormal findings in urine: Secondary | ICD-10-CM

## 2013-01-29 DIAGNOSIS — R8289 Other abnormal findings on cytological and histological examination of urine: Secondary | ICD-10-CM

## 2013-01-29 LAB — URINALYSIS, MICROSCOPIC - CHCC
Glucose: NEGATIVE mg/dL
Nitrite: NEGATIVE
Urobilinogen, UR: 0.2 mg/dL (ref 0.2–1)

## 2013-01-29 LAB — BASIC METABOLIC PANEL (CC13)
BUN: 9.5 mg/dL (ref 7.0–26.0)
Calcium: 9 mg/dL (ref 8.4–10.4)
Glucose: 99 mg/dl (ref 70–140)
Sodium: 138 mEq/L (ref 136–145)

## 2013-01-29 LAB — CBC WITH DIFFERENTIAL/PLATELET
BASO%: 0.4 % (ref 0.0–2.0)
Basophils Absolute: 0.1 10*3/uL (ref 0.0–0.1)
EOS%: 3.7 % (ref 0.0–7.0)
HGB: 11 g/dL — ABNORMAL LOW (ref 11.6–15.9)
MCH: 30.6 pg (ref 25.1–34.0)
MCHC: 33.2 g/dL (ref 31.5–36.0)
MCV: 92.2 fL (ref 79.5–101.0)
MONO%: 6.4 % (ref 0.0–14.0)
NEUT%: 71.5 % (ref 38.4–76.8)
RDW: 14 % (ref 11.2–14.5)

## 2013-01-29 MED ORDER — ZOLPIDEM TARTRATE 5 MG PO TABS: 5.0000 mg | ORAL_TABLET | Freq: Every evening | ORAL | Status: DC | PRN

## 2013-01-29 MED ORDER — SULFAMETHOXAZOLE-TRIMETHOPRIM 800-160 MG PO TABS: 1.0000 | ORAL_TABLET | Freq: Two times a day (BID) | ORAL | Status: DC

## 2013-01-29 NOTE — Patient Instructions (Signed)
Doing great.  Continue daily wet to dry dressing changes.  Please keep a log of your temperatures and call for elevated temp.  Please call for any concerns.

## 2013-01-29 NOTE — Progress Notes (Signed)
Follow Up Note: Gyn-Onc  [name] 52 y.o. female  CC:  Chief Complaint  Patient presents with  . Follow up post-op    Follow up    HPI: [name] is a 52 year old caucasian female seen in consultation request of Dr. Arlan Organ regarding management of recurrent granulosa cell tumor. Patient first underwent surgery in 2007 with final path resulting a stage I C. granulosa cell tumor. She received 3 cycles of bleomycin, etoposide, and cisplatin chemotherapy as an adjunct.  Recently, the patient developed lower abdominal pain and noticed a "knot" in the region of her prior Pfannenstiel incision. A CT scan was obtained showing a large mass in the anterior abdominal wall as well as 2 other masses in the pelvis. The anterior mass measures 9.5 x 10 x 11.5 cm with cystic and solid components. In addition there 2 masses in the pelvis measuring 5.3 x 4.3 and 5.5 x 5.0 cm. A fine-needle biopsy of the mass was then obtained confirming recurrent granulosa cell tumor. The mass seemed to extend into the anterior abdominal wall in the pelvis.  On 01/19/13, she underwent radical debulking of recurrent ovarian cancer including rectosigmoid resection with low rectal anastomosis, resection of right distal ureter, ureteroneocystostomy with psoas hitch, and omentectomy by Dr. Stanford Breed.  Final pathology resulted: 1. Soft tissue mass, simple excision - RECURRENT ADULT GRANULOSA CELL TUMOR, 7.7 CM.  2. Rectum, resection - RECURRENT ADULT GRANULOSA CELL TUMOR, 14.5 CM.  3. Colon, resection margin (donut), rectal surgical margins - BENIGN COLONIC MUCOSA, NO EVIDENCE OF TUMOR.  4. Omentum, resection other than for tumor - BENIGN MATURE ADIPOSE TISSUE WITH LYMPHOID AGGREGATE, NO EVIDENCE OF TUMOR.  Postoperative course included transfusion of 2 units PRBCs on 01/19/13 and 01/21/13 due to acute blood loss related to surgery blood loss. She was discharged to  home on postoperative day 6 tolerating a regular diet and passing flatus. 23 staples removed from midline dressing and blake drain removed without difficulty. Hematoma noted superficially under the incision and incision superficially opened by Dr. Tamela Oddi. No active signs of bleeding or signs of infection noted.  She was discharged home with home health for incision assessment and dressing changes.      Interval History:  She presents today for post-operative follow up, lab work, and incision check.  She reports doing well. Adequate PO intake reported.  Bowels functioning without difficulty and passing flatus.  Pain minimal.  Catheter in place with dark yellow urine noted in the leg bag.  She has been ambulating without difficulty.  Home health has been coming to her home to teach daily wet to dry dressing changes to the midline abdominal incision.  She reports that the incision looks smaller with no drainage, odor, or bleeding reported from the incision.  She reports monitoring her temperature at home with no febrile readings reported.  She states when she sits down to have a bowel movement, she experiences a burning sensation coming from her bladder.  No foul urine reported.  No other concerns voiced.    Review of Systems Constitutional: Feels well.  No fever, chills, early satiety, unintentional weight loss or gain, or change in appetite.  Cardiovascular: No chest pain, shortness of breath, or edema.  Pulmonary: No cough or wheeze.  Gastrointestinal: No nausea, vomiting, or diarrhea. No bright red blood per rectum or change in bowel movement.  Genitourinary: Burning sensation reported as internal near the bladder per pt when having a bowel movement.  No vaginal bleeding or  discharge.  Musculoskeletal: No myalgia or joint pain. Neurologic: No weakness, numbness, or change in gait.  Psychology: Insomnia since discharge from the hospital.  No depression or anxiety.  Current Meds:  Outpatient  Encounter Prescriptions as of 01/29/2013  Medication Sig Dispense Refill  . amiodarone (PACERONE) 200 MG tablet Take 100 mg by mouth every morning. 1/2 tablet by mouth daily      . atorvastatin (LIPITOR) 40 MG tablet Take 40 mg by mouth every evening.       . carvedilol (COREG) 12.5 MG tablet Take 12.5 mg by mouth 2 (two) times daily with a meal.      . cholecalciferol (VITAMIN D) 1000 UNITS tablet Take 1,000 Units by mouth 2 (two) times daily.      Marland Kitchen enoxaparin (LOVENOX) 40 MG/0.4ML injection Inject 0.4 mLs (40 mg total) into the skin daily.  28 Syringe  0  . fish oil-omega-3 fatty acids 1000 MG capsule Take 1 g by mouth 2 (two) times daily.      Marland Kitchen levothyroxine (SYNTHROID, LEVOTHROID) 75 MCG tablet Take 75 mcg by mouth daily before breakfast.       . Multiple Vitamins-Minerals (MULTIVITAMIN PO) Take by mouth every morning.      Marland Kitchen oxyCODONE-acetaminophen (PERCOCET/ROXICET) 5-325 MG per tablet Take 1-2 tablets by mouth every 4 (four) hours as needed (moderate to severe pain.).  60 tablet  0  . spironolactone (ALDACTONE) 25 MG tablet Take 25 mg by mouth 2 (two) times daily.       Marland Kitchen zolpidem (AMBIEN) 5 MG tablet Take 1 tablet (5 mg total) by mouth at bedtime as needed for sleep.  15 tablet  0   No facility-administered encounter medications on file as of 01/29/2013.    Allergy:  Allergies  Allergen Reactions  . Latex Hives, Rash and Other (See Comments)    LATEX TAPE "makes almost a raw spot"; tolerates latex gloves; tolerates paper tape  . Prochlorperazine Edisylate Other (See Comments)    Nervous/ flutter/ shakes    Social Hx:   History   Social History  . Marital Status: Divorced    Spouse Name: N/A    Number of Children: N/A  . Years of Education: N/A   Occupational History  .      WORKS FULL TIME   Social History Main Topics  . Smoking status: Current Some Day Smoker -- 0.25 packs/day for 30 years    Types: Cigarettes  . Smokeless tobacco: Never Used  . Alcohol Use: No  .  Drug Use: No  . Sexually Active: Not Currently   Other Topics Concern  . Not on file   Social History Narrative   WORKS FULL TIME   SINGLE   TOBACCO USE-YES   IMPLANTATION OF DUAL-CHAMBER St. JUDE DEFIBRILLATOR    Past Surgical Hx:  Past Surgical History  Procedure Laterality Date  . Cardiac defibrillator placement  02/06/2012    "lead change"  . Insert / replace / remove pacemaker  10/2009    DUAL-CHAMBER St. JUDE DEFIBRILLATOR  . Tonsillectomy and adenoidectomy  ~ 1967  . Appendectomy  1989  . Vaginal hysterectomy  2001  . Tubal ligation  1993  . Bilateral oophorectomy  2007  . Mitral valve replacement  2011    "pig valve"  . Cardiac valve replacement    . Transurethral resection of bladder  2009    "for bladder cancer"  . Thymus removed    . Total hip arthroplasty  05/06/2012  Procedure: TOTAL HIP ARTHROPLASTY;  Surgeon: Loanne Drilling, MD;  Location: [facility];  Service: Orthopedics;  Laterality: Right;  . Laparotomy N/A 01/19/2013    Procedure: TUMOR DEBULKING / BOWEL RESECTION/INSERTION RIGHT UTERERAL DOUBLE J STENT/OMENTECTOMY;  Surgeon: Jeannette Corpus, MD;  Location: [facility];  Service: Gynecology;  Laterality: N/A;    Past Medical Hx:  Past Medical History  Diagnosis Date  . Arrhythmia     ventricular tachycardia  . Unspecified systolic heart failure   . Ventricular fibrillation   . Coronary artery disease   . ICD (implantable cardiac defibrillator) in place   . Pacemaker   . High cholesterol   . CHF (congestive heart failure)   . Myocardial infarction 2011  . Hypothyroidism   . Arthritis 02/06/2012    "everywhere"  . Bladder cancer 2009    "injected medicine to get rid of it"  . Granulosa cell carcinoma of ovary 2007    right; "had chemo"  . Pneumonia     as child  . GERD (gastroesophageal reflux disease)     Family Hx: History reviewed. No pertinent family history.  Vitals:  Blood pressure 98/58, pulse 70, temperature 98.2 F (36.8 C),  temperature source Oral, resp. rate 16, height 5' 6.34" (1.685 m), weight 213 lb 3.2 oz (96.707 kg).  Physical Exam:  General: Well developed, well nourished female in no acute distress. Alert and oriented x 3.  Skin: No rashes or lesions present. Back: No CVA tenderness.  Abdomen: Abdomen soft, non-tender and obese. Active bowel sounds in all quadrants.  JP site to the right lower abdomen well healed with no erythema or drainage noted.  Midline incision superficially open, healing well with red/pink wound bed.  No drainage or foul odor.  No tunneling noted.  One 4x4 gauze moistened with saline and placed over the wound bed.  Dry dressing applied. Genitourinary:  Urine in leg bag clear with dark yellow appearance.  Urine sample obtained.  Extremities: No bilateral cyanosis, edema, or clubbing.   Assessment/Plan:  52 year old with recurrent granulosa cell tumor s/p radical debulking of recurrent ovarian cancer including rectosigmoid resection with low rectal anastomosis, resection of right distal ureter, ureteroneocystostomy with psoas hitch, and omentectomy by Dr. Stanford Breed on 01/19/13.  She is doing well post-operatively.  CBC reviewed with the patient and Dr. Tamela Oddi via phone.  Will obtain urine sample today to evaluate complaints of dysuria and leukocytosis.  She is advised to continue daily dressing changes.  She is scheduled to have a cystogram on Wednesday, August 13.  Ambien given for insomnia (patient has taken in the past and tolerated well) with instructions for 5 mg at bedtime as needed.  She is advised to call for any questions or concerns.      Update: 15:00pm:  Patient notified of Bmet and urinalysis results.  Advised of Dr. Nelwyn Salisbury recommendations to begin Bactrim DS until the urine culture is available.    Rayanne Padmanabhan DEAL, NP 01/29/2013, 1:01 PM

## 2013-02-01 ENCOUNTER — Telehealth: Payer: Self-pay | Admitting: Gynecologic Oncology

## 2013-02-01 NOTE — Telephone Encounter (Signed)
Patient informed of urine culture results.  Reporting improvement in the burning sensation around the bladder with having a bowel movement.  Informed of Dr. Nelwyn Salisbury recommendations to continue the antibiotics for a total of seven days.  Verbalizing understanding.  For a cystogram on Wed. August 13.  Instructed to call for any questions or concerns.

## 2013-02-03 ENCOUNTER — Ambulatory Visit (HOSPITAL_COMMUNITY)
Admission: RE | Admit: 2013-02-03 | Discharge: 2013-02-03 | Disposition: A | Discharge status: Home / Self Care | Payer: BC Managed Care – PPO | Source: Ambulatory Visit | Attending: Gynecologic Oncology | Admitting: Gynecologic Oncology

## 2013-02-03 ENCOUNTER — Encounter: Payer: Self-pay | Admitting: Gynecologic Oncology

## 2013-02-03 DIAGNOSIS — Z960 Presence of urogenital implants: Secondary | ICD-10-CM

## 2013-02-03 DIAGNOSIS — C569 Malignant neoplasm of unspecified ovary: Secondary | ICD-10-CM

## 2013-02-04 NOTE — Progress Notes (Signed)
Patient presented to the office after her cystogram for foley catheter removal.  Cystogram resulted no leaks.  She reports doing well with mild nausea when taking antibiotics for UTI.  She reports having solid bowel movements and passing flatus.  Foley catheter removed without difficulty at 11:10am.  Patient continues to perform daily wet to dry dressing changes to the midline abdomen incision.  She voided 250 cc of clear, yellow urine at 12:36pm without difficulty.  Reportable signs and symptoms reviewed.  She was advised of Dr. Nelwyn Salisbury recommendations to void every three hours to avoid the bladder distention.  Instructed to call for any needs.  Follow up arranged for August 22.

## 2013-02-05 ENCOUNTER — Other Ambulatory Visit (HOSPITAL_COMMUNITY): Payer: BC Managed Care – PPO

## 2013-02-05 ENCOUNTER — Telehealth: Payer: Self-pay | Admitting: Gynecologic Oncology

## 2013-02-05 ENCOUNTER — Other Ambulatory Visit: Payer: Self-pay | Admitting: Gynecologic Oncology

## 2013-02-05 DIAGNOSIS — R112 Nausea with vomiting, unspecified: Secondary | ICD-10-CM

## 2013-02-05 MED ORDER — ONDANSETRON HCL 4 MG PO TABS: 4.0000 mg | ORAL_TABLET | Freq: Three times a day (TID) | ORAL | Status: DC | PRN

## 2013-02-05 NOTE — Telephone Encounter (Signed)
Returned call to patient about complaints of nausea.  Reporting nausea intermittently, more around the time when she takes Bactrim.  Her last dose was this am.  No emesis reported.  Tolerating PO liquids.  Reporting continued bowel movements and flatus.  Voiding without difficulty.  No abdominal pain.  Advised that zofran PO was sent to [facility] for her but to use sparingly and continue stool softeners to prevent constipation.  Reportable signs and symptoms reviewed.  Advised to monitor nausea and call the office if symptoms persist.

## 2013-02-05 NOTE — Progress Notes (Signed)
Patient called and left message requesting nausea medication for intermittent nausea related to taking antibiotics.  Having bowel movements and passing flatus.  Returned call and spoke with family member.  Stating that the patient was asleep.  Asked the family member to have the patient call the office when she woke up.

## 2013-02-08 ENCOUNTER — Telehealth: Payer: Self-pay | Admitting: Gynecologic Oncology

## 2013-02-08 NOTE — Telephone Encounter (Signed)
Called and spoke with the patient about current status.  Stating that she has no nausea this am with +bowel movements and +flatus.  Reporting that her midline incision is almost completely healed.  No concerns voiced.  Follow up scheduled for this Friday.  Advised to call for any needs.

## 2013-02-09 ENCOUNTER — Ambulatory Visit: Payer: BC Managed Care – PPO | Attending: Gynecology | Admitting: Gynecology

## 2013-02-09 ENCOUNTER — Encounter: Payer: Self-pay | Admitting: Gynecology

## 2013-02-09 VITALS — BP 102/60 | HR 80 | Temp 99.0°F | Resp 18 | Ht 66.34 in | Wt 204.6 lb

## 2013-02-09 DIAGNOSIS — E039 Hypothyroidism, unspecified: Secondary | ICD-10-CM | POA: Insufficient documentation

## 2013-02-09 DIAGNOSIS — Z79899 Other long term (current) drug therapy: Secondary | ICD-10-CM | POA: Insufficient documentation

## 2013-02-09 DIAGNOSIS — I509 Heart failure, unspecified: Secondary | ICD-10-CM | POA: Insufficient documentation

## 2013-02-09 DIAGNOSIS — Z95810 Presence of automatic (implantable) cardiac defibrillator: Secondary | ICD-10-CM | POA: Insufficient documentation

## 2013-02-09 DIAGNOSIS — Z9221 Personal history of antineoplastic chemotherapy: Secondary | ICD-10-CM | POA: Insufficient documentation

## 2013-02-09 DIAGNOSIS — Z96649 Presence of unspecified artificial hip joint: Secondary | ICD-10-CM | POA: Insufficient documentation

## 2013-02-09 DIAGNOSIS — C569 Malignant neoplasm of unspecified ovary: Secondary | ICD-10-CM | POA: Insufficient documentation

## 2013-02-09 DIAGNOSIS — D3910 Neoplasm of uncertain behavior of unspecified ovary: Secondary | ICD-10-CM

## 2013-02-09 DIAGNOSIS — G4700 Insomnia, unspecified: Secondary | ICD-10-CM

## 2013-02-09 DIAGNOSIS — I2510 Atherosclerotic heart disease of native coronary artery without angina pectoris: Secondary | ICD-10-CM | POA: Insufficient documentation

## 2013-02-09 DIAGNOSIS — I252 Old myocardial infarction: Secondary | ICD-10-CM | POA: Insufficient documentation

## 2013-02-09 MED ORDER — ZOLPIDEM TARTRATE 5 MG PO TABS: 5.0000 mg | ORAL_TABLET | Freq: Every evening | ORAL | Status: DC | PRN

## 2013-02-09 NOTE — Patient Instructions (Signed)
Continue wound dressing changes as your currently doing. We will schedule stent removal for September 2.

## 2013-02-09 NOTE — Progress Notes (Signed)
Consult Note: Gyn-Onc   [name] 52 y.o. female  Chief Complaint  Patient presents with  . Granulosa Cell Tumor    Follow up    Assessment : Recurrent granulosa cell tumor. She's had a good postoperative recovery.  Plan:  Patiet will continue dressing changes daily until the wound is completely healed. We will schedule cystoscopy with stent removal on September 2.  Thereafter we will return the patient to Dr. Myna Hidalgo to initiate chemotherapy. Given that she recurred after receiving BEP I would recommend she now be treated with adjuvant carboplatin and Taxol given every 3 weeks for 6 cycles.  Interval history: Patient returns for continued postoperative followup. Overall she's doing well and is feeling stronger. She is having normal bowel movements. Initially she was having some urinary frequency but this seems to have resolved. Her Foley catheter was removed after a normal cystogram. Her appetite is good  HPI: 52 year old white female seen in consultation request of Dr. Junie Panning regarding management of recurrent granulosa cell tumor. Patient first underwent surgery in 2007 vitamin stage I C. granulosa cell tumor. She received 3 cycles of bleomycin, etoposide, and cisplatin chemotherapy as an adjunct.  Recently the patient developed a lower nominal pain and noticed a "knot" in the region of her prior Pfannenstiel incision. A CT scan was obtained showing a large mass in the anterior abdominal wall as well as 2 other masses in the pelvis. The anterior mass measures 9.5 x 10 x 11.5 cm with cystic and solid components. In addition there 2 masses in the pelvis measuring 5.3 x 4.3 and 5.5 x 5.0 cm. A fine-needle biopsy of the mass is then obtained confirming recurrent granulosa cell tumor. The mass seems to extend into the anterior abdominal wall in the pelvis.  She underwent radical debulking of her recurrent granulosa cell tumor on 01/20/2013. All disease was resected. This required a  low rectal anastomosis and resection of distal ureter with reimplantation.   Review of Systems:10 point review of systems is negative except as noted in interval history.   Vitals: Blood pressure 102/60, pulse 80, temperature 99 F (37.2 C), temperature source Oral, resp. rate 18, height 5' 6.34" (1.685 m), weight 204 lb 9.6 oz (92.806 kg).  Physical Exam: General : The patient is a healthy woman in no acute distress.  HEENT: normocephalic, extraoccular movements normal; neck is supple without thyromegally  Lynphnodes: Supraclavicular and inguinal nodes not enlarged  Abdomen: Soft, non-tender, no ascites, no organomegally, midline incision is nearly completely healed with minimal granulation tissue.  Pelvic:  Deferred   Lower extremities: No edema or varicosities. Normal range of motion      Allergies  Allergen Reactions  . Latex Hives, Rash and Other (See Comments)    LATEX TAPE "makes almost a raw spot"; tolerates latex gloves; tolerates paper tape  . Prochlorperazine Edisylate Other (See Comments)    Nervous/ flutter/ shakes    Past Medical History  Diagnosis Date  . Arrhythmia     ventricular tachycardia  . Unspecified systolic heart failure   . Ventricular fibrillation   . Coronary artery disease   . ICD (implantable cardiac defibrillator) in place   . Pacemaker   . High cholesterol   . CHF (congestive heart failure)   . Myocardial infarction 2011  . Hypothyroidism   . Arthritis 02/06/2012    "everywhere"  . Bladder cancer 2009    "injected medicine to get rid of it"  . Granulosa cell carcinoma of ovary 2007  right; "had chemo"  . Pneumonia     as child  . GERD (gastroesophageal reflux disease)     Past Surgical History  Procedure Laterality Date  . Cardiac defibrillator placement  02/06/2012    "lead change"  . Insert / replace / remove pacemaker  10/2009    DUAL-CHAMBER St. JUDE DEFIBRILLATOR  . Tonsillectomy and adenoidectomy  ~ 1967  . Appendectomy   1989  . Vaginal hysterectomy  2001  . Tubal ligation  1993  . Bilateral oophorectomy  2007  . Mitral valve replacement  2011    "pig valve"  . Cardiac valve replacement    . Transurethral resection of bladder  2009    "for bladder cancer"  . Thymus removed    . Total hip arthroplasty  05/06/2012    Procedure: TOTAL HIP ARTHROPLASTY;  Surgeon: Loanne Drilling, MD;  Location: [facility];  Service: Orthopedics;  Laterality: Right;  . Laparotomy N/A 01/19/2013    Procedure: TUMOR DEBULKING / BOWEL RESECTION/INSERTION RIGHT UTERERAL DOUBLE J STENT/OMENTECTOMY;  Surgeon: Jeannette Corpus, MD;  Location: [facility];  Service: Gynecology;  Laterality: N/A;    Current Outpatient Prescriptions  Medication Sig Dispense Refill  . amiodarone (PACERONE) 200 MG tablet Take 100 mg by mouth every morning. 1/2 tablet by mouth daily      . atorvastatin (LIPITOR) 40 MG tablet Take 40 mg by mouth every evening.       . carvedilol (COREG) 12.5 MG tablet Take 12.5 mg by mouth 2 (two) times daily with a meal.      . cholecalciferol (VITAMIN D) 1000 UNITS tablet Take 1,000 Units by mouth 2 (two) times daily.      Marland Kitchen enoxaparin (LOVENOX) 40 MG/0.4ML injection Inject 0.4 mLs (40 mg total) into the skin daily.  28 Syringe  0  . fish oil-omega-3 fatty acids 1000 MG capsule Take 1 g by mouth 2 (two) times daily.      Marland Kitchen levothyroxine (SYNTHROID, LEVOTHROID) 75 MCG tablet Take 75 mcg by mouth daily before breakfast.       . Multiple Vitamins-Minerals (MULTIVITAMIN PO) Take by mouth every morning.      . ondansetron (ZOFRAN) 4 MG tablet Take 1 tablet (4 mg total) by mouth every 8 (eight) hours as needed for nausea.  15 tablet  0  . oxyCODONE-acetaminophen (PERCOCET/ROXICET) 5-325 MG per tablet Take 1-2 tablets by mouth every 4 (four) hours as needed (moderate to severe pain.).  60 tablet  0  . spironolactone (ALDACTONE) 25 MG tablet Take 25 mg by mouth 2 (two) times daily.       Marland Kitchen sulfamethoxazole-trimethoprim (BACTRIM  DS,SEPTRA DS) 800-160 MG per tablet Take 1 tablet by mouth 2 (two) times daily.  20 tablet  0  . zolpidem (AMBIEN) 5 MG tablet Take 1 tablet (5 mg total) by mouth at bedtime as needed for sleep.  15 tablet  0   No current facility-administered medications for this visit.    History   Social History  . Marital Status: Divorced    Spouse Name: N/A    Number of Children: N/A  . Years of Education: N/A   Occupational History  .      WORKS FULL TIME   Social History Main Topics  . Smoking status: Current Some Day Smoker -- 0.25 packs/day for 30 years    Types: Cigarettes  . Smokeless tobacco: Never Used  . Alcohol Use: No  . Drug Use: No  . Sexual Activity: Not  Currently   Other Topics Concern  . Not on file   Social History Narrative   WORKS FULL TIME   SINGLE   TOBACCO USE-YES   IMPLANTATION OF DUAL-CHAMBER St. JUDE DEFIBRILLATOR    History reviewed. No pertinent family history.    Jeannette Corpus, MD 02/09/2013, 2:04 PM

## 2013-02-10 ENCOUNTER — Other Ambulatory Visit: Payer: Self-pay | Admitting: Hematology & Oncology

## 2013-02-10 DIAGNOSIS — D3911 Neoplasm of uncertain behavior of right ovary: Secondary | ICD-10-CM

## 2013-02-11 ENCOUNTER — Telehealth: Payer: Self-pay | Admitting: Hematology & Oncology

## 2013-02-11 NOTE — Telephone Encounter (Signed)
Pt aware of 9-8 appointment, she scheduled 9-12 but changed it

## 2013-02-12 ENCOUNTER — Ambulatory Visit: Payer: BC Managed Care – PPO | Admitting: Gynecology

## 2013-02-12 ENCOUNTER — Encounter (HOSPITAL_COMMUNITY): Payer: Self-pay | Admitting: Pharmacy Technician

## 2013-02-15 ENCOUNTER — Ambulatory Visit (HOSPITAL_BASED_OUTPATIENT_CLINIC_OR_DEPARTMENT_OTHER): Payer: BC Managed Care – PPO | Admitting: Lab

## 2013-02-15 ENCOUNTER — Other Ambulatory Visit: Payer: Self-pay | Admitting: Gynecologic Oncology

## 2013-02-15 DIAGNOSIS — R300 Dysuria: Secondary | ICD-10-CM

## 2013-02-15 LAB — URINALYSIS, MICROSCOPIC - CHCC
Ketones: NEGATIVE mg/dL
Protein: 300 mg/dL
Specific Gravity, Urine: 1.015 (ref 1.003–1.035)
pH: 6.5 (ref 4.6–8.0)

## 2013-02-15 NOTE — Progress Notes (Signed)
Patient called with complaints of moderate burning with urination and cloudy urine.  She denies hematuria, fever, chills, abdominal pain.  Bowels functioning without difficulty.  Ad[facility] to give a urine specimen.  Verbalizing understanding.  Appointment made.  Instructed to call for any needs and that she would be contacted with the results later this afternoon of the urinalysis.

## 2013-02-16 ENCOUNTER — Other Ambulatory Visit: Payer: Self-pay | Admitting: Gynecologic Oncology

## 2013-02-16 DIAGNOSIS — N390 Urinary tract infection, site not specified: Secondary | ICD-10-CM

## 2013-02-16 MED ORDER — CIPROFLOXACIN HCL 500 MG PO TABS: 500.0000 mg | ORAL_TABLET | Freq: Two times a day (BID) | ORAL | Status: DC

## 2013-02-16 NOTE — Progress Notes (Signed)
Message left with instructions to begin Cipro twice daily for a total of 7 days.  Reportable signs and symptoms reviewed.  Instructed to call for any questions or concerns.

## 2013-02-17 ENCOUNTER — Encounter (HOSPITAL_COMMUNITY)
Admission: RE | Admit: 2013-02-17 | Discharge: 2013-02-17 | Disposition: A | Discharge status: Home / Self Care | Payer: BC Managed Care – PPO | Source: Ambulatory Visit | Attending: Gynecology | Admitting: Gynecology

## 2013-02-17 ENCOUNTER — Telehealth: Payer: Self-pay | Admitting: Gynecologic Oncology

## 2013-02-17 ENCOUNTER — Encounter (HOSPITAL_COMMUNITY): Payer: Self-pay

## 2013-02-17 DIAGNOSIS — Z01812 Encounter for preprocedural laboratory examination: Secondary | ICD-10-CM | POA: Insufficient documentation

## 2013-02-17 LAB — BASIC METABOLIC PANEL
BUN: 9 mg/dL (ref 6–23)
Chloride: 100 mEq/L (ref 96–112)
GFR calc Af Amer: 51 mL/min — ABNORMAL LOW (ref >90)
Potassium: 4.3 mEq/L (ref 3.5–5.1)

## 2013-02-17 LAB — CBC
HCT: 36.9 % (ref 36.0–46.0)
Hemoglobin: 12.5 g/dL (ref 12.0–15.0)
RDW: 14 % (ref 11.5–15.5)
WBC: 10.1 10*3/uL (ref 4.0–10.5)

## 2013-02-17 NOTE — Progress Notes (Signed)
Chest x-ray 01/12/13 on [facility], EKG 05/26/12 on [facility], perioperative prescription for implanted cardiac device on chart.

## 2013-02-17 NOTE — Telephone Encounter (Signed)
Family member instructed to please have the patient call the office with an update on her current status.

## 2013-02-17 NOTE — Telephone Encounter (Signed)
Patient informed of culture results.  No complaints voiced with taking Cipro twice daily.  Instructed to please call with an update in one to two days.  Reportable signs or symptoms reviewed.

## 2013-02-17 NOTE — Patient Instructions (Addendum)
20 [name]  02/17/2013   Your procedure is scheduled on: 02/23/13  Report to [facility] at 5:15 AM.  Call this number if you have problems the morning of surgery [phone]: [phone]   Remember:   Do not eat food or drink liquids After Midnight.     Take these medicines the morning of surgery with A SIP OF WATER: amiodarone, carvedilol, cipro, synthroid, hydrocodone if needed   Do not wear jewelry, make-up or nail polish.  Do not wear lotions, powders, or perfumes. You may wear deodorant.  Do not shave 48 hours prior to surgery. Men may shave face and neck.  Do not bring valuables to the hospital.  Contacts, dentures or bridgework may not be worn into surgery.     Patients discharged the day of surgery will not be allowed to drive home.  Name and phone number of your driver: [name] [phone]    Please read over the following fact sheets that you were given: incentive spirometry fact sheet Birdie Sons, RN  pre op nurse call if needed [phone]    FAILURE TO FOLLOW THESE INSTRUCTIONS MAY RESULT IN CANCELLATION OF YOUR SURGERY   Patient Signature: ___________________________________________

## 2013-02-19 ENCOUNTER — Telehealth: Payer: Self-pay | Admitting: Gynecologic Oncology

## 2013-02-19 NOTE — Telephone Encounter (Signed)
Spoke with patient about pre-op status.  Reporting some improvement in dysuria symptoms.  No fever, chills, or abdominal pain.  Reportable signs and symptoms reviewed.  Advised to call for any questions or concerns.

## 2013-02-22 NOTE — Anesthesia Preprocedure Evaluation (Addendum)
Anesthesia Evaluation  Patient identified by MRN, date of birth, ID band Patient awake    Reviewed: Allergy & Precautions, H&P , NPO status , Patient's Chart, lab work & pertinent test results, reviewed documented beta blocker date and time   Airway Mallampati: II TM Distance: >3 FB Neck ROM: full    Dental no notable dental hx. (+) Teeth Intact and Dental Advisory Given   Pulmonary neg pulmonary ROS,  breath sounds clear to auscultation  Pulmonary exam normal       Cardiovascular Exercise Tolerance: Good + CAD, + Past MI and +CHF negative cardio ROS  + pacemaker + Cardiac Defibrillator Rhythm:regular Rate:Normal  MVR. Systolic sysfunction. MI 2011   Neuro/Psych negative neurological ROS  negative psych ROS   GI/Hepatic negative GI ROS, Neg liver ROS,   Endo/Other  negative endocrine ROSHypothyroidism   Renal/GU negative Renal ROS  negative genitourinary   Musculoskeletal   Abdominal   Peds  Hematology negative hematology ROS (+)   Anesthesia Other Findings   Reproductive/Obstetrics negative OB ROS                          Anesthesia Physical Anesthesia Plan  ASA: III  Anesthesia Plan: General   Post-op Pain Management:    Induction: Intravenous  Airway Management Planned: LMA  Additional Equipment:   Intra-op Plan:   Post-operative Plan:   Informed Consent: I have reviewed the patients History and Physical, chart, labs and discussed the procedure including the risks, benefits and alternatives for the proposed anesthesia with the patient or authorized representative who has indicated his/her understanding and acceptance.   Dental Advisory Given  Plan Discussed with: CRNA and Surgeon  Anesthesia Plan Comments:         Anesthesia Quick Evaluation

## 2013-02-23 ENCOUNTER — Ambulatory Visit (HOSPITAL_COMMUNITY)
Admission: RE | Admit: 2013-02-23 | Discharge: 2013-02-23 | Disposition: A | Discharge status: Home / Self Care | Payer: BC Managed Care – PPO | Source: Ambulatory Visit | Attending: Gynecology | Admitting: Gynecology

## 2013-02-23 ENCOUNTER — Encounter (HOSPITAL_COMMUNITY): Admission: RE | Payer: Self-pay | Source: Ambulatory Visit

## 2013-02-23 ENCOUNTER — Ambulatory Visit (HOSPITAL_COMMUNITY)
Admission: RE | Admit: 2013-02-23 | Payer: BC Managed Care – PPO | Source: Ambulatory Visit | Admitting: Obstetrics & Gynecology

## 2013-02-23 ENCOUNTER — Encounter (HOSPITAL_BASED_OUTPATIENT_CLINIC_OR_DEPARTMENT_OTHER): Admission: RE | Payer: Self-pay | Source: Ambulatory Visit

## 2013-02-23 ENCOUNTER — Encounter (HOSPITAL_COMMUNITY): Payer: Self-pay | Admitting: Anesthesiology

## 2013-02-23 ENCOUNTER — Ambulatory Visit (HOSPITAL_BASED_OUTPATIENT_CLINIC_OR_DEPARTMENT_OTHER): Admission: RE | Admit: 2013-02-23 | Payer: BC Managed Care – PPO | Source: Ambulatory Visit | Admitting: Gynecology

## 2013-02-23 ENCOUNTER — Ambulatory Visit (HOSPITAL_COMMUNITY): Payer: BC Managed Care – PPO | Admitting: Anesthesiology

## 2013-02-23 ENCOUNTER — Encounter (HOSPITAL_COMMUNITY)
Admission: RE | Disposition: A | Discharge status: Home / Self Care | Payer: Self-pay | Source: Ambulatory Visit | Attending: Gynecology

## 2013-02-23 ENCOUNTER — Encounter (HOSPITAL_COMMUNITY): Payer: Self-pay | Admitting: *Deleted

## 2013-02-23 DIAGNOSIS — I471 Supraventricular tachycardia, unspecified: Secondary | ICD-10-CM | POA: Insufficient documentation

## 2013-02-23 DIAGNOSIS — F17200 Nicotine dependence, unspecified, uncomplicated: Secondary | ICD-10-CM | POA: Insufficient documentation

## 2013-02-23 DIAGNOSIS — E7800 Pure hypercholesterolemia, unspecified: Secondary | ICD-10-CM | POA: Insufficient documentation

## 2013-02-23 DIAGNOSIS — I5020 Unspecified systolic (congestive) heart failure: Secondary | ICD-10-CM | POA: Insufficient documentation

## 2013-02-23 DIAGNOSIS — Z9221 Personal history of antineoplastic chemotherapy: Secondary | ICD-10-CM | POA: Insufficient documentation

## 2013-02-23 DIAGNOSIS — Z95810 Presence of automatic (implantable) cardiac defibrillator: Secondary | ICD-10-CM | POA: Insufficient documentation

## 2013-02-23 DIAGNOSIS — C569 Malignant neoplasm of unspecified ovary: Secondary | ICD-10-CM | POA: Insufficient documentation

## 2013-02-23 DIAGNOSIS — I509 Heart failure, unspecified: Secondary | ICD-10-CM | POA: Insufficient documentation

## 2013-02-23 DIAGNOSIS — K219 Gastro-esophageal reflux disease without esophagitis: Secondary | ICD-10-CM | POA: Insufficient documentation

## 2013-02-23 DIAGNOSIS — Z483 Aftercare following surgery for neoplasm: Secondary | ICD-10-CM | POA: Insufficient documentation

## 2013-02-23 DIAGNOSIS — Z79899 Other long term (current) drug therapy: Secondary | ICD-10-CM | POA: Insufficient documentation

## 2013-02-23 DIAGNOSIS — E039 Hypothyroidism, unspecified: Secondary | ICD-10-CM | POA: Insufficient documentation

## 2013-02-23 DIAGNOSIS — I252 Old myocardial infarction: Secondary | ICD-10-CM | POA: Insufficient documentation

## 2013-02-23 DIAGNOSIS — D3910 Neoplasm of uncertain behavior of unspecified ovary: Secondary | ICD-10-CM

## 2013-02-23 DIAGNOSIS — Z8551 Personal history of malignant neoplasm of bladder: Secondary | ICD-10-CM | POA: Insufficient documentation

## 2013-02-23 DIAGNOSIS — I2510 Atherosclerotic heart disease of native coronary artery without angina pectoris: Secondary | ICD-10-CM | POA: Insufficient documentation

## 2013-02-23 DIAGNOSIS — Z466 Encounter for fitting and adjustment of urinary device: Secondary | ICD-10-CM | POA: Insufficient documentation

## 2013-02-23 HISTORY — PX: CYSTOSCOPY: SHX5120

## 2013-02-23 SURGERY — CYSTOSCOPY
Anesthesia: General | Laterality: Right | Wound class: Clean Contaminated

## 2013-02-23 SURGERY — CYSTOSCOPY
Anesthesia: Choice

## 2013-02-23 MED ORDER — FENTANYL CITRATE 0.05 MG/ML IJ SOLN: 25.0000 ug | INTRAMUSCULAR | Status: DC | PRN

## 2013-02-23 MED ORDER — ACETAMINOPHEN 650 MG RE SUPP
650.0000 mg | RECTAL | Status: DC | PRN
  Filled 2013-02-23: qty 1

## 2013-02-23 MED ORDER — SODIUM CHLORIDE 0.9 % IJ SOLN: 3.0000 mL | Freq: Two times a day (BID) | INTRAMUSCULAR | Status: DC

## 2013-02-23 MED ORDER — FENTANYL CITRATE 0.05 MG/ML IJ SOLN
INTRAMUSCULAR | Status: AC
  Filled 2013-02-23: qty 2

## 2013-02-23 MED ORDER — ACETAMINOPHEN 500 MG PO TABS
1000.0000 mg | ORAL_TABLET | Freq: Four times a day (QID) | ORAL | Status: DC
  Filled 2013-02-23 (×4): qty 2

## 2013-02-23 MED ORDER — LACTATED RINGERS IV SOLN
INTRAVENOUS | Status: DC | PRN
  Administered 2013-02-23: 07:00:00 via INTRAVENOUS

## 2013-02-23 MED ORDER — ONDANSETRON HCL 4 MG/2ML IJ SOLN
INTRAMUSCULAR | Status: DC | PRN
  Administered 2013-02-23: 4 mg via INTRAVENOUS

## 2013-02-23 MED ORDER — STERILE WATER FOR IRRIGATION IR SOLN
Status: DC | PRN
  Administered 2013-02-23: 3000 mL

## 2013-02-23 MED ORDER — PROPOFOL 10 MG/ML IV BOLUS
INTRAVENOUS | Status: DC | PRN
  Administered 2013-02-23: 150 mg via INTRAVENOUS

## 2013-02-23 MED ORDER — OXYCODONE HCL 5 MG PO TABS
5.0000 mg | ORAL_TABLET | ORAL | Status: DC | PRN
  Administered 2013-02-23: 5 mg via ORAL
  Filled 2013-02-23: qty 1

## 2013-02-23 MED ORDER — FENTANYL CITRATE 0.05 MG/ML IJ SOLN
INTRAMUSCULAR | Status: DC | PRN
  Administered 2013-02-23: 50 ug via INTRAVENOUS

## 2013-02-23 MED ORDER — SODIUM CHLORIDE 0.9 % IJ SOLN
3.0000 mL | INTRAMUSCULAR | Status: DC | PRN
Start: 2013-02-23 — End: 2013-02-23
  Administered 2013-02-23: 10:00:00 via INTRAVENOUS

## 2013-02-23 MED ORDER — MIDAZOLAM HCL 5 MG/5ML IJ SOLN
INTRAMUSCULAR | Status: DC | PRN
  Administered 2013-02-23: 2 mg via INTRAVENOUS

## 2013-02-23 MED ORDER — LIDOCAINE HCL 1 % IJ SOLN
INTRAMUSCULAR | Status: DC | PRN
  Administered 2013-02-23: 50 mg via INTRADERMAL

## 2013-02-23 MED ORDER — ONDANSETRON HCL 4 MG/2ML IJ SOLN
4.0000 mg | Freq: Four times a day (QID) | INTRAMUSCULAR | Status: DC | PRN
  Administered 2013-02-23: 4 mg via INTRAVENOUS
  Filled 2013-02-23: qty 2

## 2013-02-23 MED ORDER — SODIUM CHLORIDE 0.9 % IV SOLN: 250.0000 mL | INTRAVENOUS | Status: DC | PRN

## 2013-02-23 MED ORDER — LACTATED RINGERS IV SOLN: INTRAVENOUS | Status: DC

## 2013-02-23 MED ORDER — ACETAMINOPHEN 325 MG PO TABS: 650.0000 mg | ORAL_TABLET | ORAL | Status: DC | PRN

## 2013-02-23 SURGICAL SUPPLY — 9 items
BAG URO CATCHER STRL LF (DRAPE) ×2 IMPLANT
CATH ROBINSON RED A/P 16FR (CATHETERS) IMPLANT
CLOTH BEACON ORANGE TIMEOUT ST (SAFETY) ×2 IMPLANT
DRAPE CAMERA CLOSED 9X96 (DRAPES) ×2 IMPLANT
GLOVE BIO SURGEON STRL SZ7.5 (GLOVE) ×3 IMPLANT
GOWN STRL REIN XL XLG (GOWN DISPOSABLE) ×2 IMPLANT
MANIFOLD NEPTUNE II (INSTRUMENTS) ×1 IMPLANT
PACK CYSTO (CUSTOM PROCEDURE TRAY) ×2 IMPLANT
TUBING CONNECTING 10 (TUBING) ×2 IMPLANT

## 2013-02-23 NOTE — Anesthesia Postprocedure Evaluation (Signed)
  Anesthesia Post-op Note  Patient: [name]  Procedure(s) Performed: Procedure(s) (LRB): CYSTOSCOPY WITH STENT REMOVAL (Right)  Patient Location: PACU  Anesthesia Type: General  Level of Consciousness: awake and alert   Airway and Oxygen Therapy: Patient Spontanous Breathing  Post-op Pain: mild  Post-op Assessment: Post-op Vital signs reviewed, Patient's Cardiovascular Status Stable, Respiratory Function Stable, Patent Airway and No signs of Nausea or vomiting  Last Vitals:  Filed Vitals:   02/23/13 0741  BP: 102/53  Pulse: 67  Temp: 36.7 C  Resp: 12    Post-op Vital Signs: stable   Complications: No apparent anesthesia complications

## 2013-02-23 NOTE — Discharge Instructions (Signed)
Cystoscopy       Cystoscopy is a procedure that is used to help your caregiver diagnose and sometimes treat conditions that affect your lower urinary tract. Your lower urinary tract includes your bladder and the tube through which urine passes from your bladder out of your body (urethra). Cystoscopy is performed with a thin, tube-shaped instrument (cystoscope). The cystoscope has lenses and a light at the end so that your caregiver can see inside your bladder. The cystoscope is inserted at the entrance of your urethra. Your caregiver guides it through your urethra and into your bladder. There are two main types of cystoscopy:   Flexible cystoscopy (with a flexible cystoscope).   Rigid cystoscopy (with a rigid cystoscope).  Cystoscopy may be recommended for many conditions, including:   Urinary tract infections.   Blood in your urine (hematuria).   Loss of bladder control (urinary incontinence) or overactive bladder.   Unusual cells found in a urine sample.   Urinary blockage.   Painful urination.  Cystoscopy may also be done to remove a sample of your tissue to be checked under a microscope (biopsy). It may also be done to remove or destroy bladder stones.   LET YOUR CAREGIVER KNOW ABOUT:   Allergies to food or medicine.   Medicines taken, including vitamins, herbs, eyedrops, over-the-counter medicines, and creams.   Use of steroids (by mouth or creams).   Previous problems with anesthetics or numbing medicines.   History of bleeding problems or blood clots.   Previous surgery.   Other health problems, including diabetes and kidney problems.   Possibility of pregnancy, if this applies.  PROCEDURE   The area around the opening to your urethra will be cleaned. A medicine to numb your urethra (local anesthetic) is used. If a tissue sample or stone is removed during the procedure, you may be given a medicine to make you sleep (general anesthetic).   Your caregiver will gently insert the tip of the cystoscope into your  urethra. The cystoscope will be slowly glided through your urethra and into your bladder. Sterile fluid will flow through the cystoscope and into your bladder. The fluid will expand and stretch your bladder. This gives your caregiver a better view of your bladder walls. The procedure lasts about 15 20 minutes.   AFTER THE PROCEDURE   If a local anesthetic is used, you will be allowed to go home as soon as you are ready. If a general anesthetic is used, you will be taken to a recovery area until you are stable. You may have temporary bleeding and burning on urination.   Document Released: 06/07/2000 Document Revised: 03/04/2012 Document Reviewed: 12/02/2011   [employer]® Patient Information ©2014 [employer].

## 2013-02-23 NOTE — Op Note (Signed)
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 Right ureteral stent removal POSTOPERATIVE DIAGNOSIS: Same PROCEDURE:  Cystoscopy with stent removal SURGEON: De Blanch, M.D ASSISTANT:  ANESTHESIA: LMA  ESTIMATED BLOOD LOSS: None SURGICAL FINDINGS:  PROCEDURE: The patient was brought to the operating room and after satisfactory attainment of general anesthesia was placed in a modified lithotomy position in Lafferty stirrups. A surgical timeout was taken. The vagina urethra and perineum were prepped, and the patient was draped. Cystoscope was introduced into the bladder and the stent was identified. The stent was grasped and removed using alligator forceps. The cystoscope was replaced and no bleeding or trauma was noted. The ureteral orifice appeared healthy.  The patient was awakened from anesthesia and taken to the recovery room in satisfactory condition. Sponge needle and isthmic counts correct x2.  De Blanch, M.D

## 2013-02-23 NOTE — Progress Notes (Signed)
Barcode willnot scan for pt. Pain 6.Fentanyl IV given.

## 2013-02-23 NOTE — H&P (View-Only) (Signed)
Consult Note: Gyn-Onc   [name] 52 y.o. female  Chief Complaint  Patient presents with  . Granulosa Cell Tumor    Follow up    Assessment : Recurrent granulosa cell tumor. She's had a good postoperative recovery.  Plan:  Patiet will continue dressing changes daily until the wound is completely healed. We will schedule cystoscopy with stent removal on September 2.  Thereafter we will return the patient to Dr. Ennever to initiate chemotherapy. Given that she recurred after receiving BEP I would recommend she now be treated with adjuvant carboplatin and Taxol given every 3 weeks for 6 cycles.  Interval history: Patient returns for continued postoperative followup. Overall she's doing well and is feeling stronger. She is having normal bowel movements. Initially she was having some urinary frequency but this seems to have resolved. Her Foley catheter was removed after a normal cystogram. Her appetite is good  HPI: 52-year-old white female seen in consultation request of Dr. PeterEnnever regarding management of recurrent granulosa cell tumor. Patient first underwent surgery in 2007 vitamin stage I C. granulosa cell tumor. She received 3 cycles of bleomycin, etoposide, and cisplatin chemotherapy as an adjunct.  Recently the patient developed a lower nominal pain and noticed a "knot" in the region of her prior Pfannenstiel incision. A CT scan was obtained showing a large mass in the anterior abdominal wall as well as 2 other masses in the pelvis. The anterior mass measures 9.5 x 10 x 11.5 cm with cystic and solid components. In addition there 2 masses in the pelvis measuring 5.3 x 4.3 and 5.5 x 5.0 cm. A fine-needle biopsy of the mass is then obtained confirming recurrent granulosa cell tumor. The mass seems to extend into the anterior abdominal wall in the pelvis.  She underwent radical debulking of her recurrent granulosa cell tumor on 01/20/2013. All disease was resected. This required a  low rectal anastomosis and resection of distal ureter with reimplantation.   Review of Systems:10 point review of systems is negative except as noted in interval history.   Vitals: Blood pressure 102/60, pulse 80, temperature 99 F (37.2 C), temperature source Oral, resp. rate 18, height 5' 6.34" (1.685 m), weight 204 lb 9.6 oz (92.806 kg).  Physical Exam: General : The patient is a healthy woman in no acute distress.  HEENT: normocephalic, extraoccular movements normal; neck is supple without thyromegally  Lynphnodes: Supraclavicular and inguinal nodes not enlarged  Abdomen: Soft, non-tender, no ascites, no organomegally, midline incision is nearly completely healed with minimal granulation tissue.  Pelvic:  Deferred   Lower extremities: No edema or varicosities. Normal range of motion      Allergies  Allergen Reactions  . Latex Hives, Rash and Other (See Comments)    LATEX TAPE "makes almost a raw spot"; tolerates latex gloves; tolerates paper tape  . Prochlorperazine Edisylate Other (See Comments)    Nervous/ flutter/ shakes    Past Medical History  Diagnosis Date  . Arrhythmia     ventricular tachycardia  . Unspecified systolic heart failure   . Ventricular fibrillation   . Coronary artery disease   . ICD (implantable cardiac defibrillator) in place   . Pacemaker   . High cholesterol   . CHF (congestive heart failure)   . Myocardial infarction 2011  . Hypothyroidism   . Arthritis 02/06/2012    "everywhere"  . Bladder cancer 2009    "injected medicine to get rid of it"  . Granulosa cell carcinoma of ovary 2007      right; "had chemo"  . Pneumonia     as child  . GERD (gastroesophageal reflux disease)     Past Surgical History  Procedure Laterality Date  . Cardiac defibrillator placement  02/06/2012    "lead change"  . Insert / replace / remove pacemaker  10/2009    DUAL-CHAMBER St. JUDE DEFIBRILLATOR  . Tonsillectomy and adenoidectomy  ~ 1967  . Appendectomy   1989  . Vaginal hysterectomy  2001  . Tubal ligation  1993  . Bilateral oophorectomy  2007  . Mitral valve replacement  2011    "pig valve"  . Cardiac valve replacement    . Transurethral resection of bladder  2009    "for bladder cancer"  . Thymus removed    . Total hip arthroplasty  05/06/2012    Procedure: TOTAL HIP ARTHROPLASTY;  Surgeon: Frank V Aluisio, MD;  Location: [facility];  Service: Orthopedics;  Laterality: Right;  . Laparotomy N/A 01/19/2013    Procedure: TUMOR DEBULKING / BOWEL RESECTION/INSERTION RIGHT UTERERAL DOUBLE J STENT/OMENTECTOMY;  Surgeon: Rondia Higginbotham L Clarke-Pearson, MD;  Location: [facility];  Service: Gynecology;  Laterality: N/A;    Current Outpatient Prescriptions  Medication Sig Dispense Refill  . amiodarone (PACERONE) 200 MG tablet Take 100 mg by mouth every morning. 1/2 tablet by mouth daily      . atorvastatin (LIPITOR) 40 MG tablet Take 40 mg by mouth every evening.       . carvedilol (COREG) 12.5 MG tablet Take 12.5 mg by mouth 2 (two) times daily with a meal.      . cholecalciferol (VITAMIN D) 1000 UNITS tablet Take 1,000 Units by mouth 2 (two) times daily.      . enoxaparin (LOVENOX) 40 MG/0.4ML injection Inject 0.4 mLs (40 mg total) into the skin daily.  28 Syringe  0  . fish oil-omega-3 fatty acids 1000 MG capsule Take 1 g by mouth 2 (two) times daily.      . levothyroxine (SYNTHROID, LEVOTHROID) 75 MCG tablet Take 75 mcg by mouth daily before breakfast.       . Multiple Vitamins-Minerals (MULTIVITAMIN PO) Take by mouth every morning.      . ondansetron (ZOFRAN) 4 MG tablet Take 1 tablet (4 mg total) by mouth every 8 (eight) hours as needed for nausea.  15 tablet  0  . oxyCODONE-acetaminophen (PERCOCET/ROXICET) 5-325 MG per tablet Take 1-2 tablets by mouth every 4 (four) hours as needed (moderate to severe pain.).  60 tablet  0  . spironolactone (ALDACTONE) 25 MG tablet Take 25 mg by mouth 2 (two) times daily.       . sulfamethoxazole-trimethoprim (BACTRIM  DS,SEPTRA DS) 800-160 MG per tablet Take 1 tablet by mouth 2 (two) times daily.  20 tablet  0  . zolpidem (AMBIEN) 5 MG tablet Take 1 tablet (5 mg total) by mouth at bedtime as needed for sleep.  15 tablet  0   No current facility-administered medications for this visit.    History   Social History  . Marital Status: Divorced    Spouse Name: N/A    Number of Children: N/A  . Years of Education: N/A   Occupational History  .      WORKS FULL TIME   Social History Main Topics  . Smoking status: Current Some Day Smoker -- 0.25 packs/day for 30 years    Types: Cigarettes  . Smokeless tobacco: Never Used  . Alcohol Use: No  . Drug Use: No  . Sexual Activity: Not   Currently   Other Topics Concern  . Not on file   Social History Narrative   WORKS FULL TIME   SINGLE   TOBACCO USE-YES   IMPLANTATION OF DUAL-CHAMBER St[employer] JUDE DEFIBRILLATOR    History reviewed. No pertinent family history.    CLARKE-PEARSON,Talen Poser L, MD 02/09/2013, 2:04 PM        

## 2013-02-23 NOTE — Preoperative (Signed)
Beta Blockers   Reason not to administer Beta Blockers:Not Applicable 

## 2013-02-23 NOTE — Interval H&P Note (Signed)
History and Physical Interval Note:  02/23/2013 6:57 AM  [name]  has presented today for surgery, with the diagnosis of GRANULOSA CELL TUMOR, STENT  The various methods of treatment have been discussed with the patient and family. After consideration of risks, benefits and other options for treatment, the patient has consented to  Procedure(s): CYSTOSCOPY WITH STENT REMOVAL (N/A) as a surgical intervention .  The patient's history has been reviewed, patient examined, no change in status, stable for surgery.  I have reviewed the patient's chart and labs.  Questions were answered to the patient's satisfaction.     CLARKE-PEARSON,Kjersti Dittmer L

## 2013-02-23 NOTE — Transfer of Care (Signed)
Immediate Anesthesia Transfer of Care Note  Patient: [name]  Procedure(s) Performed: Procedure(s): CYSTOSCOPY WITH STENT REMOVAL (Right)  Patient Location: PACU  Anesthesia Type:General  Level of Consciousness: awake, alert  and oriented  Airway & Oxygen Therapy: Patient Spontanous Breathing and Patient connected to face mask oxygen  Post-op Assessment: Report given to PACU RN and Post -op Vital signs reviewed and stable  Post vital signs: Reviewed and stable  Complications: No apparent anesthesia complications

## 2013-02-23 NOTE — Progress Notes (Signed)
Barcode on armband still not scanning. Fentanyl IV given.

## 2013-02-24 ENCOUNTER — Encounter (HOSPITAL_COMMUNITY): Payer: Self-pay | Admitting: Gynecology

## 2013-02-26 ENCOUNTER — Telehealth: Payer: Self-pay | Admitting: Gynecologic Oncology

## 2013-02-26 NOTE — Telephone Encounter (Signed)
Patient doing well s/p stent removal.  No complaints of dysuria or hematuria.  No concerns voiced.  Instructed to call for any needs or concerns.

## 2013-03-01 ENCOUNTER — Other Ambulatory Visit (HOSPITAL_BASED_OUTPATIENT_CLINIC_OR_DEPARTMENT_OTHER): Payer: BC Managed Care – PPO | Admitting: Lab

## 2013-03-01 ENCOUNTER — Telehealth: Payer: Self-pay | Admitting: Hematology & Oncology

## 2013-03-01 ENCOUNTER — Ambulatory Visit (HOSPITAL_BASED_OUTPATIENT_CLINIC_OR_DEPARTMENT_OTHER): Payer: BC Managed Care – PPO | Admitting: Hematology & Oncology

## 2013-03-01 VITALS — BP 106/60 | HR 73 | Temp 98.3°F | Resp 16 | Ht <= 58 in | Wt 202.0 lb

## 2013-03-01 DIAGNOSIS — D3911 Neoplasm of uncertain behavior of right ovary: Secondary | ICD-10-CM

## 2013-03-01 DIAGNOSIS — D3910 Neoplasm of uncertain behavior of unspecified ovary: Secondary | ICD-10-CM

## 2013-03-01 LAB — CBC WITH DIFFERENTIAL (CANCER CENTER ONLY)
BASO#: 0 10*3/uL (ref 0.0–0.2)
EOS%: 1.5 % (ref 0.0–7.0)
Eosinophils Absolute: 0.1 10*3/uL (ref 0.0–0.5)
HCT: 38.7 % (ref 34.8–46.6)
HGB: 12.8 g/dL (ref 11.6–15.9)
LYMPH%: 25.9 % (ref 14.0–48.0)
MCH: 31.4 pg (ref 26.0–34.0)
MCHC: 33.1 g/dL (ref 32.0–36.0)
MCV: 95 fL (ref 81–101)
MONO%: 7.3 % (ref 0.0–13.0)
NEUT%: 65.2 % (ref 39.6–80.0)
RBC: 4.07 10*6/uL (ref 3.70–5.32)

## 2013-03-01 LAB — COMPREHENSIVE METABOLIC PANEL
ALT: 8 U/L (ref 0–35)
CO2: 23 mEq/L (ref 19–32)
Calcium: 9.5 mg/dL (ref 8.4–10.5)
Chloride: 106 mEq/L (ref 96–112)
Creatinine, Ser: 1.16 mg/dL — ABNORMAL HIGH (ref 0.50–1.10)
Glucose, Bld: 101 mg/dL — ABNORMAL HIGH (ref 70–99)
Total Bilirubin: 0.3 mg/dL (ref 0.3–1.2)
Total Protein: 6.2 g/dL (ref 6.0–8.3)

## 2013-03-01 MED ORDER — ONDANSETRON HCL 8 MG PO TABS: 8.0000 mg | ORAL_TABLET | Freq: Two times a day (BID) | ORAL | Status: DC

## 2013-03-01 MED ORDER — LORAZEPAM 0.5 MG PO TABS: 0.5000 mg | ORAL_TABLET | Freq: Four times a day (QID) | ORAL | Status: DC | PRN

## 2013-03-01 MED ORDER — DEXAMETHASONE 4 MG PO TABS: 8.0000 mg | ORAL_TABLET | Freq: Two times a day (BID) | ORAL | Status: DC

## 2013-03-01 NOTE — Progress Notes (Signed)
This office note has been dictated.

## 2013-03-01 NOTE — Telephone Encounter (Signed)
Pt aware of 9-12 MUGA, 9-16 chemo education,9-17 port to be NPO after midnight and she needs a driver, and 1-61 chemo.

## 2013-03-02 NOTE — Progress Notes (Signed)
CC:   Tammy R. Collins Scotland, M.D. Paola A. Duard Brady, MD Guy Sandifer Henderson Cloud, M.D.  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  CURRENT THERAPY:  Patient is status post optimal debulking.  INTERIM HISTORY:  [name] comes in for followup.  We first saw her back in May.  She ultimately was found to have granulosa cell tumor.  She underwent surgical debulking.  This was done on July 30th.  The pathology report ([id number]) showed recurrent adult granulosa cell tumor.  Dr. De Blanch performed the resection.  This was a very difficult surgery.  However, with his incredible technical skills, he was able to resect out all that was present.  She did well postop.  He recently took out the ureteral stent that had to be placed during surgery.  She is somewhat emotional.  I certainly understand this.  She has been through a whole lot.  She really is reluctant to take anymore chemotherapy.  I can certainly understand this.  I told her that the standard of care would be adjuvant chemotherapy. The fact that her tumor came back is significant and that without further therapy, there is an increased risk of her having recurrence.  She finally agreed to take chemotherapy.  When we first saw her, the CA-125 was slightly elevated at 24.  She had normal beta hCG and alpha fetoprotein.  She is eating okay.  She has had no nausea or vomiting.  There has been no cough.  She has had no leg swelling.  Currently, her performance status is ECOG 1.  PHYSICAL EXAMINATION:  General:  This is a well-developed, well- nourished white female in no obvious distress.  Vital signs: Temperature of 98.3, pulse 73, respiratory rate 16, blood pressure 106/60.  Weight is 203 pounds.  Head and neck:  Normocephalic, atraumatic skull.  There are no ocular or oral lesions.  There are no palpable cervical or supraclavicular lymph nodes.  Lungs:  Clear bilaterally.  Cardiac:  Regular rate and rhythm with a  normal S1 and S2. There are no murmurs, rubs, or bruits.  Abdomen:  Soft.  She has good bowel sounds.  There is no fluid wave.  There is no palpable hepatosplenomegaly.  She has a healed laparotomy scar.  Extremities:  No clubbing, cyanosis, or edema.  Neurological:  No focal neurological deficits.  LABORATORY STUDIES:  White cell count is 9.6, hemoglobin 12.8, hematocrit 38.7, platelet count 147.  Sodium 134, potassium 4.3, BUN 9, creatinine 1.34.  IMPRESSION:  [name] is a very charming 52 year old white female with recurrent granulosa cell tumor.  She had her initial surgery and debulking back in March 2007.  She received adjuvant chemotherapy with 3 cycles of BEP.  Again, the fact that she has recurred certainly is a sign of more aggressive disease.  I went over the chemotherapy with her.  Six cycles of carboplatin/Taxol would be appropriate for her.  She has a decent performance status so that she would be able to tolerate chemo.  She will need to have a Port-A-Cath placed.  We will try to get this set up for next week.  I want to try to get chemo started on her next week.  She was wondering about work.  I told her I think that it might be tough for her to work, but it certainly is a possibility that she could.  We will plan to see her back on the day of her second cycle of chemotherapy.  I spent  a good 45 minutes with her and her husband.  Again I went over the side effects of chemotherapy with them.  I answered all their questions.    ______________________________ Josph Macho, M.D. PRE/MEDQ  D:  03/01/2013  T:  03/02/2013  Job:  [id number]

## 2013-03-04 ENCOUNTER — Other Ambulatory Visit: Payer: Self-pay | Admitting: Radiology

## 2013-03-05 ENCOUNTER — Other Ambulatory Visit: Payer: BC Managed Care – PPO | Admitting: Lab

## 2013-03-05 ENCOUNTER — Ambulatory Visit: Payer: BC Managed Care – PPO | Admitting: Hematology & Oncology

## 2013-03-05 ENCOUNTER — Encounter (HOSPITAL_COMMUNITY)
Admission: RE | Admit: 2013-03-05 | Discharge: 2013-03-05 | Disposition: A | Discharge status: Home / Self Care | Payer: BC Managed Care – PPO | Source: Ambulatory Visit | Attending: Hematology & Oncology | Admitting: Hematology & Oncology

## 2013-03-05 DIAGNOSIS — D3910 Neoplasm of uncertain behavior of unspecified ovary: Secondary | ICD-10-CM | POA: Insufficient documentation

## 2013-03-05 MED ORDER — TECHNETIUM TC 99M-LABELED RED BLOOD CELLS IV KIT
21.7000 | PACK | Freq: Once | INTRAVENOUS | Status: AC | PRN
  Administered 2013-03-05: 22 via INTRAVENOUS

## 2013-03-08 ENCOUNTER — Other Ambulatory Visit: Payer: Self-pay | Admitting: Hematology & Oncology

## 2013-03-09 ENCOUNTER — Other Ambulatory Visit: Payer: BC Managed Care – PPO

## 2013-03-09 ENCOUNTER — Encounter (HOSPITAL_COMMUNITY): Payer: Self-pay | Admitting: Pharmacy Technician

## 2013-03-09 ENCOUNTER — Other Ambulatory Visit: Payer: Self-pay | Admitting: *Deleted

## 2013-03-09 ENCOUNTER — Encounter: Payer: Self-pay | Admitting: *Deleted

## 2013-03-09 DIAGNOSIS — D3911 Neoplasm of uncertain behavior of right ovary: Secondary | ICD-10-CM

## 2013-03-09 MED ORDER — LIDOCAINE-PRILOCAINE 2.5-2.5 % EX CREA
TOPICAL_CREAM | CUTANEOUS | Status: DC | PRN
Start: 2013-03-09 — End: 2013-07-14

## 2013-03-09 NOTE — Telephone Encounter (Signed)
Sent rx for Emla cream via e-rx to [facility] in [place]. Port being inserted tomorrow.

## 2013-03-10 ENCOUNTER — Other Ambulatory Visit: Payer: Self-pay | Admitting: Hematology & Oncology

## 2013-03-10 ENCOUNTER — Ambulatory Visit (HOSPITAL_COMMUNITY)
Admission: RE | Admit: 2013-03-10 | Discharge: 2013-03-10 | Disposition: A | Discharge status: Home / Self Care | Payer: BC Managed Care – PPO | Source: Ambulatory Visit | Attending: Hematology & Oncology | Admitting: Hematology & Oncology

## 2013-03-10 ENCOUNTER — Encounter (HOSPITAL_COMMUNITY): Payer: Self-pay

## 2013-03-10 ENCOUNTER — Ambulatory Visit (HOSPITAL_COMMUNITY)
Admission: RE | Admit: 2013-03-10 | Discharge: 2013-03-10 | Disposition: A | Discharge status: Home / Self Care | Payer: BC Managed Care – PPO | Source: Ambulatory Visit | Attending: Diagnostic Radiology | Admitting: Diagnostic Radiology

## 2013-03-10 DIAGNOSIS — D3910 Neoplasm of uncertain behavior of unspecified ovary: Secondary | ICD-10-CM | POA: Insufficient documentation

## 2013-03-10 LAB — CBC WITH DIFFERENTIAL/PLATELET
Hemoglobin: 12.4 g/dL (ref 12.0–15.0)
Lymphocytes Relative: 23 % (ref 12–46)
Lymphs Abs: 2.3 10*3/uL (ref 0.7–4.0)
Monocytes Relative: 7 % (ref 3–12)
Neutro Abs: 7 10*3/uL (ref 1.7–7.7)
Neutrophils Relative %: 69 % (ref 43–77)
RBC: 4.01 MIL/uL (ref 3.87–5.11)
WBC: 10.1 10*3/uL (ref 4.0–10.5)

## 2013-03-10 LAB — BASIC METABOLIC PANEL
GFR calc Af Amer: 65 mL/min — ABNORMAL LOW (ref >90)
GFR calc non Af Amer: 56 mL/min — ABNORMAL LOW (ref >90)
Glucose, Bld: 93 mg/dL (ref 70–99)
Potassium: 4.1 mEq/L (ref 3.5–5.1)
Sodium: 138 mEq/L (ref 135–145)

## 2013-03-10 LAB — PROTIME-INR: INR: 0.98 (ref 0.00–1.49)

## 2013-03-10 MED ORDER — HEPARIN SOD (PORK) LOCK FLUSH 100 UNIT/ML IV SOLN
500.0000 [IU] | Freq: Once | INTRAVENOUS | Status: AC
  Administered 2013-03-10: 500 [IU] via INTRAVENOUS

## 2013-03-10 MED ORDER — FENTANYL CITRATE 0.05 MG/ML IJ SOLN
INTRAMUSCULAR | Status: AC
  Filled 2013-03-10: qty 4

## 2013-03-10 MED ORDER — CEFAZOLIN SODIUM-DEXTROSE 2-3 GM-% IV SOLR
2.0000 g | Freq: Once | INTRAVENOUS | Status: AC
  Administered 2013-03-10: 2 g via INTRAVENOUS

## 2013-03-10 MED ORDER — MIDAZOLAM HCL 2 MG/2ML IJ SOLN
INTRAMUSCULAR | Status: AC | PRN
  Administered 2013-03-10 (×2): 2 mg via INTRAVENOUS

## 2013-03-10 MED ORDER — FENTANYL CITRATE 0.05 MG/ML IJ SOLN
INTRAMUSCULAR | Status: AC | PRN
  Administered 2013-03-10: 100 ug via INTRAVENOUS

## 2013-03-10 MED ORDER — LIDOCAINE-EPINEPHRINE (PF) 2 %-1:200000 IJ SOLN
INTRAMUSCULAR | Status: AC
  Filled 2013-03-10: qty 20

## 2013-03-10 MED ORDER — LIDOCAINE HCL 1 % IJ SOLN
INTRAMUSCULAR | Status: AC
  Filled 2013-03-10: qty 20

## 2013-03-10 MED ORDER — SODIUM CHLORIDE 0.9 % IV SOLN
INTRAVENOUS | Status: DC
  Administered 2013-03-10: 12:00:00 via INTRAVENOUS

## 2013-03-10 MED ORDER — MIDAZOLAM HCL 2 MG/2ML IJ SOLN
INTRAMUSCULAR | Status: AC
  Filled 2013-03-10: qty 4

## 2013-03-10 NOTE — Procedures (Signed)
Post-Procedure Note  Pre-operative Diagnosis: Ovarian cancer       Post-operative Diagnosis: same   Indications:  Needs IV access for chemotherapy  Procedure Details:   Placed right subclavian portacath.  Tip in lower SVC.    Findings: Right internal jugular is very small and not adequate for catheter placement.  Patent right subclavian vein by ultrasound and left subclavian cardiac ICD.  8 French port in right subclavian vein and tip in lower SVC.   Complications: None     Condition: stable  Plan: Discharge home and port is ready to use.

## 2013-03-10 NOTE — H&P (Signed)
[name] is an 52 y.o. female.   Chief Complaint: "I'm here for a port a cath" HPI: Patient with history of recurrent granulosa cell tumor of ovary presents today for port a cath placement for chemotherapy.  Past Medical History  Diagnosis Date  . Arrhythmia     ventricular tachycardia  . Unspecified systolic heart failure   . Ventricular fibrillation   . Coronary artery disease   . ICD (implantable cardiac defibrillator) in place   . Pacemaker   . High cholesterol   . CHF (congestive heart failure)   . Myocardial infarction 2011  . Hypothyroidism   . Arthritis 02/06/2012    "everywhere"  . Bladder cancer 2009    "injected medicine to get rid of it"  . Granulosa cell carcinoma of ovary 2007    right; "had chemo"  . Pneumonia     as child    Past Surgical History  Procedure Laterality Date  . Cardiac defibrillator placement  02/06/2012    "lead change"  . Insert / replace / remove pacemaker  10/2009    DUAL-CHAMBER [employer]UDE DEFIBRILLATOR  . Tonsillectomy and adenoidectomy  ~ 1967  . Appendectomy  1989  . Vaginal hysterectomy  2001  . Tubal ligation  1993  . Bilateral oophorectomy  2007  . Mitral valve replacement  2011    "pig valve"  . Cardiac valve replacement    . Transurethral resection of bladder  2009    "for bladder cancer"  . Thymus removed    . Total hip arthroplasty  05/06/2012    Procedure: TOTAL HIP ARTHROPLASTY;  Surgeon: Loanne Drilling, MD;  Location: [facility];  Service: Orthopedics;  Laterality: Right;  . Laparotomy N/A 01/19/2013    Procedure: TUMOR DEBULKING / BOWEL RESECTION/INSERTION RIGHT UTERERAL DOUBLE J STENT/OMENTECTOMY;  Surgeon: Jeannette Corpus, MD;  Location: [facility];  Service: Gynecology;  Laterality: N/A;  . Cystoscopy Right 02/23/2013    Procedure: CYSTOSCOPY WITH STENT REMOVAL;  Surgeon: Jeannette Corpus, MD;  Location: [facility];  Service: Gynecology;  Laterality: Right;    History reviewed. No pertinent family  history. Social History:  reports that she has been smoking Cigarettes.  She has a 7.5 pack-year smoking history. She has never used smokeless tobacco. She reports that she does not drink alcohol or use illicit drugs.  Allergies:  Allergies  Allergen Reactions  . Latex Hives, Rash and Other (See Comments)    LATEX TAPE "makes almost a raw spot"; tolerates latex gloves; tolerates paper tape  . Prochlorperazine Edisylate Other (See Comments)    Nervous/ flutter/ shakes    Current outpatient prescriptions:amiodarone (PACERONE) 200 MG tablet, Take 100 mg by mouth every morning. 1/2 tablet by mouth daily, Disp: , Rfl: ;  atorvastatin (LIPITOR) 40 MG tablet, Take 40 mg by mouth every evening. , Disp: , Rfl: ;  carvedilol (COREG) 12.5 MG tablet, Take 12.5 mg by mouth 2 (two) times daily with a meal., Disp: , Rfl:  cholecalciferol (VITAMIN D) 1000 UNITS tablet, Take 1,000 Units by mouth 2 (two) times daily., Disp: , Rfl: ;  fish oil-omega-3 fatty acids 1000 MG capsule, Take 1 g by mouth 2 (two) times daily., Disp: , Rfl: ;  HYDROcodone-acetaminophen (NORCO/VICODIN) 5-325 MG per tablet, Take 1-2 tablets by mouth every 4 (four) hours as needed for pain., Disp: , Rfl:  levothyroxine (SYNTHROID, LEVOTHROID) 75 MCG tablet, Take 75 mcg by mouth daily before breakfast. , Disp: , Rfl: ;  Multiple Vitamins-Minerals (MULTIVITAMIN  PO), Take 1 tablet by mouth every morning. , Disp: , Rfl: ;  spironolactone (ALDACTONE) 25 MG tablet, Take 25 mg by mouth 2 (two) times daily. , Disp: , Rfl: ;  aspirin 325 MG tablet, Take 325 mg by mouth daily., Disp: , Rfl:  dexamethasone (DECADRON) 4 MG tablet, Take 2 tablets (8 mg total) by mouth 2 (two) times daily with a meal. Take two times a day starting the day after chemotherapy for 3 days., Disp: 30 tablet, Rfl: 3;  lidocaine-prilocaine (EMLA) cream, Apply topically as needed. Place a cotton ball size amt to port 45 min prior to use & cover with plastic wrap., Disp: 30 g, Rfl:  2 LORazepam (ATIVAN) 0.5 MG tablet, Take 1 tablet (0.5 mg total) by mouth every 6 (six) hours as needed (Nausea or vomiting)., Disp: 60 tablet, Rfl: 2;  ondansetron (ZOFRAN) 8 MG tablet, Take 1 tablet (8 mg total) by mouth 2 (two) times daily. Take two times a day starting the day after chemo for 3 days. Then take two times a day as needed for nausea or vomiting., Disp: 30 tablet, Rfl: 4 zolpidem (AMBIEN) 5 MG tablet, Take 5 mg by mouth at bedtime as needed for sleep., Disp: , Rfl:  Current facility-administered medications:0.9 %  sodium chloride infusion, , Intravenous, Continuous, Brayton El, PA-C, Last Rate: 20 mL/hr at 03/10/13 1200;  ceFAZolin (ANCEF) IVPB 2 g/50 mL premix, 2 g, Intravenous, Once, Brayton El, PA-C   Results for orders placed during the hospital encounter of 03/10/13 (from the past 48 hour(s))  CBC WITH DIFFERENTIAL     Status: None   Collection Time    03/10/13 11:55 AM      Result Value Range   WBC 10.1  4.0 - 10.5 K/uL   RBC 4.01  3.87 - 5.11 MIL/uL   Hemoglobin 12.4  12.0 - 15.0 g/dL   HCT 57.8  46.9 - 62.9 %   MCV 91.8  78.0 - 100.0 fL   MCH 30.9  26.0 - 34.0 pg   MCHC 33.7  30.0 - 36.0 g/dL   RDW 52.8  41.3 - 24.4 %   Platelets 163  150 - 400 K/uL   Neutrophils Relative % 69  43 - 77 %   Neutro Abs 7.0  1.7 - 7.7 K/uL   Lymphocytes Relative 23  12 - 46 %   Lymphs Abs 2.3  0.7 - 4.0 K/uL   Monocytes Relative 7  3 - 12 %   Monocytes Absolute 0.7  0.1 - 1.0 K/uL   Eosinophils Relative 1  0 - 5 %   Eosinophils Absolute 0.1  0.0 - 0.7 K/uL   Basophils Relative 0  0 - 1 %   Basophils Absolute 0.0  0.0 - 0.1 K/uL  PROTIME-INR     Status: None   Collection Time    03/10/13 11:55 AM      Result Value Range   Prothrombin Time 12.8  11.6 - 15.2 seconds   INR 0.98  0.00 - 1.49   No results found.  Review of Systems  Constitutional: Negative for fever and chills.  Respiratory: Negative for cough and shortness of breath.   Cardiovascular: Negative for  chest pain.  Gastrointestinal: Negative for nausea, vomiting and abdominal pain.  Musculoskeletal: Negative for back pain.  Neurological: Negative for headaches.  Endo/Heme/Allergies: Does not bruise/bleed easily.   Vitals: BP 112/67  HR 73  R 18  TEMP 98.6  O2 SATS 98% RA Physical Exam  Constitutional: She is oriented to person, place, and time. She appears well-developed and well-nourished.  Cardiovascular: Normal rate and regular rhythm.   Respiratory: Effort normal and breath sounds normal.  Left chest wall AICD  GI: Soft. Bowel sounds are normal. There is no tenderness.  Musculoskeletal: Normal range of motion. She exhibits no edema.  Neurological: She is alert and oriented to person, place, and time.     Assessment/Plan Pt with hx of recurrent granulosa cell tumor of ovary. Plan is for port a cath placement today for chemotherapy. Details/risks of procedure d/w pt/husband with their understanding and consent.  ALLRED,D KEVIN 03/10/2013, 12:40 PM

## 2013-03-11 ENCOUNTER — Encounter: Payer: Self-pay | Admitting: Internal Medicine

## 2013-03-11 ENCOUNTER — Other Ambulatory Visit: Payer: Self-pay | Admitting: Hematology & Oncology

## 2013-03-11 ENCOUNTER — Ambulatory Visit (INDEPENDENT_AMBULATORY_CARE_PROVIDER_SITE_OTHER): Payer: BC Managed Care – PPO | Admitting: Internal Medicine

## 2013-03-11 VITALS — BP 150/70 | HR 67 | Ht 66.0 in | Wt 201.0 lb

## 2013-03-11 DIAGNOSIS — I472 Ventricular tachycardia: Secondary | ICD-10-CM

## 2013-03-11 DIAGNOSIS — I5020 Unspecified systolic (congestive) heart failure: Secondary | ICD-10-CM

## 2013-03-11 DIAGNOSIS — Z95810 Presence of automatic (implantable) cardiac defibrillator: Secondary | ICD-10-CM

## 2013-03-11 DIAGNOSIS — I4901 Ventricular fibrillation: Secondary | ICD-10-CM

## 2013-03-11 LAB — ICD DEVICE OBSERVATION
AL AMPLITUDE: 2.8 mv
AL THRESHOLD: 0.5 V
DEV-0020ICD: NEGATIVE
HV IMPEDENCE: 65 Ohm
MODE SWITCH EPISODES: 0
PACEART VT: 0
RV LEAD THRESHOLD: 2.5 V
TOT-0007: 1
TOT-0009: 2
TOT-0010: 12
TZAT-0018FASTVT: NEGATIVE
TZAT-0019FASTVT: 7.5 V
TZAT-0020FASTVT: 1 ms
TZON-0003FASTVT: 315 ms
TZON-0003SLOWVT: 400 ms
TZON-0004SLOWVT: 40
TZON-0005FASTVT: 6
TZON-0005SLOWVT: 6
TZON-0010FASTVT: 40 ms
TZON-0010SLOWVT: 40 ms
TZST-0001FASTVT: 2
TZST-0001FASTVT: 4
TZST-0003FASTVT: 36 J
TZST-0003FASTVT: 40 J
VF: 0

## 2013-03-11 NOTE — Progress Notes (Signed)
HPI [name] returns today for followup. She is a very pleasant 52 year old woman with a nonischemic cardiomyopathy, chronic systolic heart failure, status post ICD implantation. She has recurrent cancer, and is pending chemotherapy. A recent radionuclide ventriculogram done straight at an ejection fraction of 41%. She has class II heart failure symptoms.  Allergies  Allergen Reactions  . Latex Hives, Rash and Other (See Comments)    LATEX TAPE "makes almost a raw spot"; tolerates latex gloves; tolerates paper tape  . Prochlorperazine Edisylate Other (See Comments)    Nervous/ flutter/ shakes     Current Outpatient Prescriptions  Medication Sig Dispense Refill  . amiodarone (PACERONE) 200 MG tablet every morning. 1/2 tablet by mouth daily      . aspirin 325 MG tablet Take 325 mg by mouth daily.      Marland Kitchen atorvastatin (LIPITOR) 40 MG tablet Take 40 mg by mouth every evening.       . carvedilol (COREG) 12.5 MG tablet Take 12.5 mg by mouth 2 (two) times daily with a meal.      . cholecalciferol (VITAMIN D) 1000 UNITS tablet Take 1,000 Units by mouth 2 (two) times daily.      Marland Kitchen dexamethasone (DECADRON) 4 MG tablet Take 2 tablets (8 mg total) by mouth 2 (two) times daily with a meal. Take two times a day starting the day after chemotherapy for 3 days.  30 tablet  3  . fish oil-omega-3 fatty acids 1000 MG capsule Take 1 g by mouth 2 (two) times daily.      Marland Kitchen levothyroxine (SYNTHROID, LEVOTHROID) 75 MCG tablet Take 75 mcg by mouth daily before breakfast.       . lidocaine-prilocaine (EMLA) cream Apply topically as needed. Place a cotton ball size amt to port 45 min prior to use & cover with plastic wrap.  30 g  2  . LORazepam (ATIVAN) 0.5 MG tablet Take 1 tablet (0.5 mg total) by mouth every 6 (six) hours as needed (Nausea or vomiting).  60 tablet  2  . Multiple Vitamins-Minerals (MULTIVITAMIN PO) Take 1 tablet by mouth every morning.       . ondansetron (ZOFRAN) 8 MG tablet Take 1 tablet (8 mg total)  by mouth 2 (two) times daily. Take two times a day starting the day after chemo for 3 days. Then take two times a day as needed for nausea or vomiting.  30 tablet  4  . spironolactone (ALDACTONE) 25 MG tablet Take 25 mg by mouth 2 (two) times daily.       Marland Kitchen zolpidem (AMBIEN) 5 MG tablet Take 5 mg by mouth at bedtime as needed for sleep.      Marland Kitchen HYDROcodone-acetaminophen (NORCO/VICODIN) 5-325 MG per tablet TAKE 2 TABLETS BY MOUTH EVERY 4 TO 6 HOURS AS NEEDED  90 tablet  2   No current facility-administered medications for this visit.     Past Medical History  Diagnosis Date  . Arrhythmia     ventricular tachycardia  . Unspecified systolic heart failure   . Ventricular fibrillation   . Coronary artery disease   . ICD (implantable cardiac defibrillator) in place   . Pacemaker   . High cholesterol   . CHF (congestive heart failure)   . Myocardial infarction 2011  . Hypothyroidism   . Arthritis 02/06/2012    "everywhere"  . Bladder cancer 2009    "injected medicine to get rid of it"  . Granulosa cell carcinoma of ovary 2007    right; "  had chemo"  . Pneumonia     as child    ROS:   All systems reviewed and negative except as noted in the HPI.   Past Surgical History  Procedure Laterality Date  . Cardiac defibrillator placement  02/06/2012    "lead change"  . Insert / replace / remove pacemaker  10/2009    DUAL-CHAMBER [employer] JUDE DEFIBRILLATOR  . Tonsillectomy and adenoidectomy  ~ 1967  . Appendectomy  1989  . Vaginal hysterectomy  2001  . Tubal ligation  1993  . Bilateral oophorectomy  2007  . Mitral valve replacement  2011    "pig valve"  . Cardiac valve replacement    . Transurethral resection of bladder  2009    "for bladder cancer"  . Thymus removed    . Total hip arthroplasty  05/06/2012    Procedure: TOTAL HIP ARTHROPLASTY;  Surgeon: Stacy Drilling, MD;  Location: [facility];  Service: Orthopedics;  Laterality: Right;  . Laparotomy N/A 01/19/2013    Procedure: TUMOR  DEBULKING / BOWEL RESECTION/INSERTION RIGHT UTERERAL DOUBLE J STENT/OMENTECTOMY;  Surgeon: Stacy Corpus, MD;  Location: [facility];  Service: Gynecology;  Laterality: N/A;  . Cystoscopy Right 02/23/2013    Procedure: CYSTOSCOPY WITH STENT REMOVAL;  Surgeon: Stacy Corpus, MD;  Location: [facility];  Service: Gynecology;  Laterality: Right;     History reviewed. No pertinent family history.   History   Social History  . Marital Status: Divorced    Spouse Name: N/A    Number of Children: N/A  . Years of Education: N/A   Occupational History  .      WORKS FULL TIME   Social History Main Topics  . Smoking status: Current Some Day Smoker -- 0.25 packs/day for 30 years    Types: Cigarettes  . Smokeless tobacco: Never Used  . Alcohol Use: No  . Drug Use: No  . Sexual Activity: Not Currently   Other Topics Concern  . Not on file   Social History Narrative   WORKS FULL TIME   SINGLE   TOBACCO USE-YES   IMPLANTATION OF DUAL-CHAMBER St[employer] JUDE DEFIBRILLATOR     BP 150/70  Pulse 67  Ht 5\' 6"  (1.676 m)  Wt 201 lb (91.173 kg)  BMI 32.46 kg/m2  Physical Exam:  Well appearing middle-age woman,NAD HEENT: Unremarkable Neck:  No JVD, no thyromegally Back:  No CVA tenderness Lungs:  Clear with no wheezes, rales, or rhonchi. Right chest has a healing Port-A-Cath HEART:  Regular rate rhythm, no murmurs, no rubs, no clicks Abd:  soft, positive bowel sounds, no organomegally, no rebound, no guarding Ext:  2 plus pulses, no edema, no cyanosis, no clubbing Skin:  No rashes no nodules Neuro:  CN II through XII intact, motor grossly intact  DEVICE  Normal device function.  See PaceArt for details.   Assess/Plan:

## 2013-03-11 NOTE — Assessment & Plan Note (Signed)
Her [employer] ICD is working normally. We'll plan to recheck in several months. 

## 2013-03-11 NOTE — Patient Instructions (Addendum)
Remote monitoring is used to monitor your Pacemaker of ICD from home. This monitoring reduces the number of office visits required to check your device to one time per year. It allows [place] to keep an eye on the functioning of your device to ensure it is working properly. You are scheduled for a device check from home on 06/14/2013 Merlin. You may send your transmission at any time that day. If you have a wireless device, the transmission will be sent automatically. After your physician reviews your transmission, you will receive a postcard with your next transmission date.  Your physician wants you to follow-up in: 12 months Dr. Ladona Ridgel. You will receive a reminder letter in the mail two months in advance. If you don't receive a letter, please call our office to schedule the follow-up appointment.  Your physician recommends that you continue on your current medications as directed. Please refer to the Current Medication list given to you today.

## 2013-03-11 NOTE — Assessment & Plan Note (Signed)
Her chronic systolic heart failure is well compensated, and her ejection fraction is 41%. She'll continue her current medical therapy, and maintain a low-sodium diet.

## 2013-03-11 NOTE — Assessment & Plan Note (Signed)
She has had no recurrent ventricular arrhythmias. She'll continue low-dose amiodarone.

## 2013-03-12 ENCOUNTER — Other Ambulatory Visit: Payer: Self-pay | Admitting: Hematology & Oncology

## 2013-03-12 ENCOUNTER — Ambulatory Visit (HOSPITAL_BASED_OUTPATIENT_CLINIC_OR_DEPARTMENT_OTHER): Payer: BC Managed Care – PPO

## 2013-03-12 VITALS — BP 100/65 | HR 72 | Temp 98.6°F | Resp 20

## 2013-03-12 DIAGNOSIS — D3910 Neoplasm of uncertain behavior of unspecified ovary: Secondary | ICD-10-CM

## 2013-03-12 DIAGNOSIS — Z5111 Encounter for antineoplastic chemotherapy: Secondary | ICD-10-CM

## 2013-03-12 MED ORDER — SODIUM CHLORIDE 0.9 % IJ SOLN
10.0000 mL | INTRAMUSCULAR | Status: DC | PRN
  Administered 2013-03-12: 10 mL
  Filled 2013-03-12: qty 10

## 2013-03-12 MED ORDER — DEXAMETHASONE SODIUM PHOSPHATE 20 MG/5ML IJ SOLN
20.0000 mg | Freq: Once | INTRAMUSCULAR | Status: AC
  Administered 2013-03-12: 20 mg via INTRAVENOUS
  Filled 2013-03-12: qty 5

## 2013-03-12 MED ORDER — HEPARIN SOD (PORK) LOCK FLUSH 100 UNIT/ML IV SOLN
500.0000 [IU] | Freq: Once | INTRAVENOUS | Status: AC | PRN
  Administered 2013-03-12: 500 [IU]
  Filled 2013-03-12: qty 5

## 2013-03-12 MED ORDER — SODIUM CHLORIDE 0.9 % IV SOLN
Freq: Once | INTRAVENOUS | Status: AC
  Administered 2013-03-12: 10:00:00 via INTRAVENOUS

## 2013-03-12 MED ORDER — FAMOTIDINE IN NACL 20-0.9 MG/50ML-% IV SOLN
20.0000 mg | Freq: Once | INTRAVENOUS | Status: AC
  Administered 2013-03-12: 20 mg via INTRAVENOUS

## 2013-03-12 MED ORDER — ONDANSETRON 16 MG/50ML IVPB (CHCC)
16.0000 mg | Freq: Once | INTRAVENOUS | Status: AC
  Administered 2013-03-12: 16 mg via INTRAVENOUS

## 2013-03-12 MED ORDER — DIPHENHYDRAMINE HCL 50 MG/ML IJ SOLN
50.0000 mg | Freq: Once | INTRAMUSCULAR | Status: AC
  Administered 2013-03-12: 50 mg via INTRAVENOUS

## 2013-03-12 MED ORDER — SODIUM CHLORIDE 0.9 % IV SOLN
500.0000 mg | Freq: Once | INTRAVENOUS | Status: AC
  Administered 2013-03-12: 500 mg via INTRAVENOUS
  Filled 2013-03-12: qty 50

## 2013-03-12 MED ORDER — PACLITAXEL CHEMO INJECTION 300 MG/50ML
175.0000 mg/m2 | Freq: Once | INTRAVENOUS | Status: AC
  Administered 2013-03-12: 330 mg via INTRAVENOUS
  Filled 2013-03-12: qty 55

## 2013-03-12 NOTE — Patient Instructions (Addendum)
[facility] Discharge Instructions for Patients Receiving Chemotherapy  Today you received chemotherapy agents.  Carboplatin and Taxol.  To help prevent nausea and vomiting after your treatment, we encourage you to take your nausea medication as prescribed. If you develop nausea and vomiting that is not controlled by your nausea medication, call the clinic. If it is after clinic hours your family physician or the after hours number for the clinic or go to the Emergency Department.   BELOW ARE SYMPTOMS THAT SHOULD BE REPORTED IMMEDIATELY:  *FEVER GREATER THAN 100.5 F  *CHILLS WITH OR WITHOUT FEVER  NAUSEA AND VOMITING THAT IS NOT CONTROLLED WITH YOUR NAUSEA MEDICATION  *UNUSUAL SHORTNESS OF BREATH  *UNUSUAL BRUISING OR BLEEDING  TENDERNESS IN MOUTH AND THROAT WITH OR WITHOUT PRESENCE OF ULCERS  *URINARY PROBLEMS  *BOWEL PROBLEMS  UNUSUAL RASH Items with * indicate a potential emergency and should be followed up as soon as possible.   Please let the nurse know about any problems that you may have experienced. Feel free to call the clinic you have any questions or concerns. The clinic phone number is [phone].   I have been informed and understand all the instructions given to me. I know to contact the clinic, my physician, or go to the Emergency Department if any problems should occur. I do not have any questions at this time, but understand that I may call the clinic during office hours   should I have any questions or need assistance in obtaining follow up care.    __________________________________________  _____________  __________ Signature of Patient or Authorized Representative            Date                   Time    __________________________________________ Nurse's Signature   Paclitaxel injection What is this medicine? PACLITAXEL (PAK li TAX el) is a chemotherapy drug. It targets fast dividing cells, like cancer cells, and causes these  cells to die. This medicine is used to treat ovarian cancer, breast cancer, and other cancers. This medicine may be used for other purposes; ask your health care provider or pharmacist if you have questions. What should I tell my health care provider before I take this medicine? They need to know if you have any of these conditions: -blood disorders -irregular heartbeat -infection (especially a virus infection such as chickenpox, cold sores, or herpes) -liver disease -previous or ongoing radiation therapy -an unusual or allergic reaction to paclitaxel, alcohol, polyoxyethylated castor oil, other chemotherapy agents, other medicines, foods, dyes, or preservatives -pregnant or trying to get pregnant -breast-feeding How should I use this medicine? This drug is given as an infusion into a vein. It is administered in a hospital or clinic by a specially trained health care professional. Talk to your pediatrician regarding the use of this medicine in children. Special care may be needed. Overdosage: If you think you have taken too much of this medicine contact a poison control center or emergency room at once. NOTE: This medicine is only for you. Do not share this medicine with others. What if I miss a dose? It is important not to miss your dose. Call your doctor or health care professional if you are unable to keep an appointment. What may interact with this medicine? Do not take this medicine with any of the following medications: -disulfiram -metronidazole This medicine may also interact with the following medications: -cyclosporine -dexamethasone -diazepam -ketoconazole -medicines to increase  blood counts like filgrastim, pegfilgrastim, sargramostim -other chemotherapy drugs like cisplatin, doxorubicin, epirubicin, etoposide, teniposide, vincristine -quinidine -testosterone -vaccines -verapamil Talk to your doctor or health care professional before taking any of these  medicines: -acetaminophen -aspirin -ibuprofen -ketoprofen -naproxen This list may not describe all possible interactions. Give your health care provider a list of all the medicines, herbs, non-prescription drugs, or dietary supplements you use. Also tell them if you smoke, drink alcohol, or use illegal drugs. Some items may interact with your medicine. What should I watch for while using this medicine? Your condition will be monitored carefully while you are receiving this medicine. You will need important blood work done while you are taking this medicine. This drug may make you feel generally unwell. This is not uncommon, as chemotherapy can affect healthy cells as well as cancer cells. Report any side effects. Continue your course of treatment even though you feel ill unless your doctor tells you to stop. In some cases, you may be given additional medicines to help with side effects. Follow all directions for their use. Call your doctor or health care professional for advice if you get a fever, chills or sore throat, or other symptoms of a cold or flu. Do not treat yourself. This drug decreases your body's ability to fight infections. Try to avoid being around people who are sick. This medicine may increase your risk to bruise or bleed. Call your doctor or health care professional if you notice any unusual bleeding. Be careful brushing and flossing your teeth or using a toothpick because you may get an infection or bleed more easily. If you have any dental work done, tell your dentist you are receiving this medicine. Avoid taking products that contain aspirin, acetaminophen, ibuprofen, naproxen, or ketoprofen unless instructed by your doctor. These medicines may hide a fever. Do not become pregnant while taking this medicine. Women should inform their doctor if they wish to become pregnant or think they might be pregnant. There is a potential for serious side effects to an unborn child. Talk to  your health care professional or pharmacist for more information. Do not breast-feed an infant while taking this medicine. Men are advised not to father a child while receiving this medicine. What side effects may I notice from receiving this medicine? Side effects that you should report to your doctor or health care professional as soon as possible: -allergic reactions like skin rash, itching or hives, swelling of the face, lips, or tongue -low blood counts - This drug may decrease the number of white blood cells, red blood cells and platelets. You may be at increased risk for infections and bleeding. -signs of infection - fever or chills, cough, sore throat, pain or difficulty passing urine -signs of decreased platelets or bleeding - bruising, pinpoint red spots on the skin, black, tarry stools, nosebleeds -signs of decreased red blood cells - unusually weak or tired, fainting spells, lightheadedness -breathing problems -chest pain -high or low blood pressure -mouth sores -nausea and vomiting -pain, swelling, redness or irritation at the injection site -pain, tingling, numbness in the hands or feet -slow or irregular heartbeat -swelling of the ankle, feet, hands Side effects that usually do not require medical attention (report to your doctor or health care professional if they continue or are bothersome): -bone pain -complete hair loss including hair on your head, underarms, pubic hair, eyebrows, and eyelashes -changes in the color of fingernails -diarrhea -loosening of the fingernails -loss of appetite -muscle or joint pain -red  flush to skin -sweating This list may not describe all possible side effects. Call your doctor for medical advice about side effects. You may report side effects to FDA at 1-800-FDA-1088. Where should I keep my medicine? This drug is given in a hospital or clinic and will not be stored at home. NOTE: This sheet is a summary. It may not cover all possible  information. If you have questions about this medicine, talk to your doctor, pharmacist, or health care provider.  2012, [employer]. (05/23/2008 11:54:26 AM)Carboplatin injection What is this medicine? CARBOPLATIN (KAR boe pla tin) is a chemotherapy drug. It targets fast dividing cells, like cancer cells, and causes these cells to die. This medicine is used to treat ovarian cancer and many other cancers. This medicine may be used for other purposes; ask your health care provider or pharmacist if you have questions. What should I tell my health care provider before I take this medicine? They need to know if you have any of these conditions: -blood disorders -hearing problems -kidney disease -recent or ongoing radiation therapy -an unusual or allergic reaction to carboplatin, cisplatin, other chemotherapy, other medicines, foods, dyes, or preservatives -pregnant or trying to get pregnant -breast-feeding How should I use this medicine? This drug is usually given as an infusion into a vein. It is administered in a hospital or clinic by a specially trained health care professional. Talk to your pediatrician regarding the use of this medicine in children. Special care may be needed. Overdosage: If you think you have taken too much of this medicine contact a poison control center or emergency room at once. NOTE: This medicine is only for you. Do not share this medicine with others. What if I miss a dose? It is important not to miss a dose. Call your doctor or health care professional if you are unable to keep an appointment. What may interact with this medicine? -medicines for seizures -medicines to increase blood counts like filgrastim, pegfilgrastim, sargramostim -some antibiotics like amikacin, gentamicin, neomycin, streptomycin, tobramycin -vaccines Talk to your doctor or health care professional before taking any of these  medicines: -acetaminophen -aspirin -ibuprofen -ketoprofen -naproxen This list may not describe all possible interactions. Give your health care provider a list of all the medicines, herbs, non-prescription drugs, or dietary supplements you use. Also tell them if you smoke, drink alcohol, or use illegal drugs. Some items may interact with your medicine. What should I watch for while using this medicine? Your condition will be monitored carefully while you are receiving this medicine. You will need important blood work done while you are taking this medicine. This drug may make you feel generally unwell. This is not uncommon, as chemotherapy can affect healthy cells as well as cancer cells. Report any side effects. Continue your course of treatment even though you feel ill unless your doctor tells you to stop. In some cases, you may be given additional medicines to help with side effects. Follow all directions for their use. Call your doctor or health care professional for advice if you get a fever, chills or sore throat, or other symptoms of a cold or flu. Do not treat yourself. This drug decreases your body's ability to fight infections. Try to avoid being around people who are sick. This medicine may increase your risk to bruise or bleed. Call your doctor or health care professional if you notice any unusual bleeding. Be careful brushing and flossing your teeth or using a toothpick because you may get an infection  or bleed more easily. If you have any dental work done, tell your dentist you are receiving this medicine. Avoid taking products that contain aspirin, acetaminophen, ibuprofen, naproxen, or ketoprofen unless instructed by your doctor. These medicines may hide a fever. Do not become pregnant while taking this medicine. Women should inform their doctor if they wish to become pregnant or think they might be pregnant. There is a potential for serious side effects to an unborn child. Talk to  your health care professional or pharmacist for more information. Do not breast-feed an infant while taking this medicine. What side effects may I notice from receiving this medicine? Side effects that you should report to your doctor or health care professional as soon as possible: -allergic reactions like skin rash, itching or hives, swelling of the face, lips, or tongue -signs of infection - fever or chills, cough, sore throat, pain or difficulty passing urine -signs of decreased platelets or bleeding - bruising, pinpoint red spots on the skin, black, tarry stools, nosebleeds -signs of decreased red blood cells - unusually weak or tired, fainting spells, lightheadedness -breathing problems -changes in hearing -changes in vision -chest pain -high blood pressure -low blood counts - This drug may decrease the number of white blood cells, red blood cells and platelets. You may be at increased risk for infections and bleeding. -nausea and vomiting -pain, swelling, redness or irritation at the injection site -pain, tingling, numbness in the hands or feet -problems with balance, talking, walking -trouble passing urine or change in the amount of urine Side effects that usually do not require medical attention (report to your doctor or health care professional if they continue or are bothersome): -hair loss -loss of appetite -metallic taste in the mouth or changes in taste This list may not describe all possible side effects. Call your doctor for medical advice about side effects. You may report side effects to FDA at 1-800-FDA-1088. Where should I keep my medicine? This drug is given in a hospital or clinic and will not be stored at home. NOTE: This sheet is a summary. It may not cover all possible information. If you have questions about this medicine, talk to your doctor, pharmacist, or health care provider.  2012, [employer]. (09/15/2007 2:38:05 PM)

## 2013-03-15 ENCOUNTER — Encounter: Payer: Self-pay | Admitting: Internal Medicine

## 2013-03-16 ENCOUNTER — Other Ambulatory Visit: Payer: Self-pay | Admitting: Hematology & Oncology

## 2013-03-17 ENCOUNTER — Encounter: Payer: Self-pay | Admitting: Hematology & Oncology

## 2013-03-29 ENCOUNTER — Encounter: Payer: Self-pay | Admitting: *Deleted

## 2013-03-29 NOTE — Progress Notes (Signed)
[facility] Psychosocial Distress Screening Clinical Social Work  Clinical Social Work was referred by distress screening protocol.  The patient scored a 7 on the Psychosocial Distress Thermometer which indicates moderate distress. Clinical Social Worker contacted pt at home to assess for distress and other psychosocial needs.  Pt stated she was doing "ok" and was aware of our support programs.  CSW informed pt of additional support programs.  Pt was appreciative of contact and stated she would call with any needs or concerns.    Tamala Julian, MSW, LCSW Clinical Social Worker [facility] [phone]

## 2013-04-01 ENCOUNTER — Other Ambulatory Visit: Payer: Self-pay | Admitting: *Deleted

## 2013-04-01 DIAGNOSIS — D3910 Neoplasm of uncertain behavior of unspecified ovary: Secondary | ICD-10-CM

## 2013-04-02 ENCOUNTER — Ambulatory Visit (HOSPITAL_BASED_OUTPATIENT_CLINIC_OR_DEPARTMENT_OTHER): Payer: BC Managed Care – PPO | Admitting: Hematology & Oncology

## 2013-04-02 ENCOUNTER — Ambulatory Visit (HOSPITAL_BASED_OUTPATIENT_CLINIC_OR_DEPARTMENT_OTHER): Payer: BC Managed Care – PPO

## 2013-04-02 ENCOUNTER — Other Ambulatory Visit (HOSPITAL_BASED_OUTPATIENT_CLINIC_OR_DEPARTMENT_OTHER): Payer: BC Managed Care – PPO | Admitting: Lab

## 2013-04-02 VITALS — BP 98/62 | HR 82 | Temp 98.2°F | Resp 14 | Ht 66.0 in | Wt 198.0 lb

## 2013-04-02 DIAGNOSIS — R6889 Other general symptoms and signs: Secondary | ICD-10-CM

## 2013-04-02 DIAGNOSIS — Z5111 Encounter for antineoplastic chemotherapy: Secondary | ICD-10-CM

## 2013-04-02 DIAGNOSIS — D3910 Neoplasm of uncertain behavior of unspecified ovary: Secondary | ICD-10-CM

## 2013-04-02 LAB — CBC WITH DIFFERENTIAL (CANCER CENTER ONLY)
BASO#: 0 10*3/uL (ref 0.0–0.2)
Eosinophils Absolute: 0.1 10*3/uL (ref 0.0–0.5)
HCT: 37.3 % (ref 34.8–46.6)
HGB: 12.6 g/dL (ref 11.6–15.9)
LYMPH%: 31.4 % (ref 14.0–48.0)
MCH: 31.3 pg (ref 26.0–34.0)
MCV: 93 fL (ref 81–101)
MONO#: 0.8 10*3/uL (ref 0.1–0.9)
NEUT%: 58.9 % (ref 39.6–80.0)
Platelets: 128 10*3/uL — ABNORMAL LOW (ref 145–400)
RBC: 4.02 10*6/uL (ref 3.70–5.32)
WBC: 9.6 10*3/uL (ref 3.9–10.0)

## 2013-04-02 MED ORDER — HEPARIN SOD (PORK) LOCK FLUSH 100 UNIT/ML IV SOLN
500.0000 [IU] | Freq: Once | INTRAVENOUS | Status: AC | PRN
  Administered 2013-04-02: 500 [IU]
  Filled 2013-04-02: qty 5

## 2013-04-02 MED ORDER — SODIUM CHLORIDE 0.9 % IV SOLN
Freq: Once | INTRAVENOUS | Status: AC
  Administered 2013-04-02: 10:00:00 via INTRAVENOUS

## 2013-04-02 MED ORDER — PACLITAXEL CHEMO INJECTION 300 MG/50ML
157.5000 mg/m2 | Freq: Once | INTRAVENOUS | Status: AC
  Administered 2013-04-02: 300 mg via INTRAVENOUS
  Filled 2013-04-02: qty 50

## 2013-04-02 MED ORDER — ONDANSETRON 16 MG/50ML IVPB (CHCC)
INTRAVENOUS | Status: AC
  Filled 2013-04-02: qty 16

## 2013-04-02 MED ORDER — SODIUM CHLORIDE 0.9 % IV SOLN
500.0000 mg | Freq: Once | INTRAVENOUS | Status: AC
  Administered 2013-04-02: 500 mg via INTRAVENOUS
  Filled 2013-04-02: qty 50

## 2013-04-02 MED ORDER — ONDANSETRON 16 MG/50ML IVPB (CHCC)
16.0000 mg | Freq: Once | INTRAVENOUS | Status: AC
  Administered 2013-04-02: 16 mg via INTRAVENOUS

## 2013-04-02 MED ORDER — DIPHENHYDRAMINE HCL 50 MG/ML IJ SOLN
INTRAMUSCULAR | Status: AC
  Filled 2013-04-02: qty 1

## 2013-04-02 MED ORDER — ZOLPIDEM TARTRATE 5 MG PO TABS: 5.0000 mg | ORAL_TABLET | Freq: Every evening | ORAL | Status: DC | PRN

## 2013-04-02 MED ORDER — FAMOTIDINE IN NACL 20-0.9 MG/50ML-% IV SOLN
20.0000 mg | Freq: Once | INTRAVENOUS | Status: AC
  Administered 2013-04-02: 20 mg via INTRAVENOUS

## 2013-04-02 MED ORDER — DIPHENHYDRAMINE HCL 50 MG/ML IJ SOLN
50.0000 mg | Freq: Once | INTRAMUSCULAR | Status: AC
  Administered 2013-04-02: 50 mg via INTRAVENOUS

## 2013-04-02 MED ORDER — DEXAMETHASONE SODIUM PHOSPHATE 20 MG/5ML IJ SOLN
20.0000 mg | Freq: Once | INTRAMUSCULAR | Status: AC
  Administered 2013-04-02: 20 mg via INTRAVENOUS

## 2013-04-02 MED ORDER — DEXAMETHASONE SODIUM PHOSPHATE 20 MG/5ML IJ SOLN
INTRAMUSCULAR | Status: AC
  Filled 2013-04-02: qty 5

## 2013-04-02 MED ORDER — FAMOTIDINE IN NACL 20-0.9 MG/50ML-% IV SOLN
INTRAVENOUS | Status: AC
  Filled 2013-04-02: qty 50

## 2013-04-02 MED ORDER — HYDROCODONE-ACETAMINOPHEN 5-325 MG PO TABS: 1.0000 | ORAL_TABLET | Freq: Four times a day (QID) | ORAL | Status: DC | PRN

## 2013-04-02 MED ORDER — SODIUM CHLORIDE 0.9 % IJ SOLN
10.0000 mL | INTRAMUSCULAR | Status: DC | PRN
  Administered 2013-04-02: 10 mL
  Filled 2013-04-02: qty 10

## 2013-04-02 NOTE — Patient Instructions (Signed)
[facility] Discharge Instructions for Patients Receiving Chemotherapy  Today you received chemotherapy agents.  Carboplatin and Taxol.  To help prevent nausea and vomiting after your treatment, we encourage you to take your nausea medication as prescribed. If you develop nausea and vomiting that is not controlled by your nausea medication, call the clinic. If it is after clinic hours your family physician or the after hours number for the clinic or go to the Emergency Department.   BELOW ARE SYMPTOMS THAT SHOULD BE REPORTED IMMEDIATELY:  *FEVER GREATER THAN 100.5 F  *CHILLS WITH OR WITHOUT FEVER  NAUSEA AND VOMITING THAT IS NOT CONTROLLED WITH YOUR NAUSEA MEDICATION  *UNUSUAL SHORTNESS OF BREATH  *UNUSUAL BRUISING OR BLEEDING  TENDERNESS IN MOUTH AND THROAT WITH OR WITHOUT PRESENCE OF ULCERS  *URINARY PROBLEMS  *BOWEL PROBLEMS  UNUSUAL RASH Items with * indicate a potential emergency and should be followed up as soon as possible.   Please let the nurse know about any problems that you may have experienced. Feel free to call the clinic you have any questions or concerns. The clinic phone number is [phone].   I have been informed and understand all the instructions given to me. I know to contact the clinic, my physician, or go to the Emergency Department if any problems should occur. I do not have any questions at this time, but understand that I may call the clinic during office hours   should I have any questions or need assistance in obtaining follow up care.    __________________________________________  _____________  __________ Signature of Patient or Authorized Representative            Date                   Time    __________________________________________ Nurse's Signature   Paclitaxel injection What is this medicine? PACLITAXEL (PAK li TAX el) is a chemotherapy drug. It targets fast dividing cells, like cancer cells, and causes these  cells to die. This medicine is used to treat ovarian cancer, breast cancer, and other cancers. This medicine may be used for other purposes; ask your health care provider or pharmacist if you have questions. What should I tell my health care provider before I take this medicine? They need to know if you have any of these conditions: -blood disorders -irregular heartbeat -infection (especially a virus infection such as chickenpox, cold sores, or herpes) -liver disease -previous or ongoing radiation therapy -an unusual or allergic reaction to paclitaxel, alcohol, polyoxyethylated castor oil, other chemotherapy agents, other medicines, foods, dyes, or preservatives -pregnant or trying to get pregnant -breast-feeding How should I use this medicine? This drug is given as an infusion into a vein. It is administered in a hospital or clinic by a specially trained health care professional. Talk to your pediatrician regarding the use of this medicine in children. Special care may be needed. Overdosage: If you think you have taken too much of this medicine contact a poison control center or emergency room at once. NOTE: This medicine is only for you. Do not share this medicine with others. What if I miss a dose? It is important not to miss your dose. Call your doctor or health care professional if you are unable to keep an appointment. What may interact with this medicine? Do not take this medicine with any of the following medications: -disulfiram -metronidazole This medicine may also interact with the following medications: -cyclosporine -dexamethasone -diazepam -ketoconazole -medicines to increase  blood counts like filgrastim, pegfilgrastim, sargramostim -other chemotherapy drugs like cisplatin, doxorubicin, epirubicin, etoposide, teniposide, vincristine -quinidine -testosterone -vaccines -verapamil Talk to your doctor or health care professional before taking any of these  medicines: -acetaminophen -aspirin -ibuprofen -ketoprofen -naproxen This list may not describe all possible interactions. Give your health care provider a list of all the medicines, herbs, non-prescription drugs, or dietary supplements you use. Also tell them if you smoke, drink alcohol, or use illegal drugs. Some items may interact with your medicine. What should I watch for while using this medicine? Your condition will be monitored carefully while you are receiving this medicine. You will need important blood work done while you are taking this medicine. This drug may make you feel generally unwell. This is not uncommon, as chemotherapy can affect healthy cells as well as cancer cells. Report any side effects. Continue your course of treatment even though you feel ill unless your doctor tells you to stop. In some cases, you may be given additional medicines to help with side effects. Follow all directions for their use. Call your doctor or health care professional for advice if you get a fever, chills or sore throat, or other symptoms of a cold or flu. Do not treat yourself. This drug decreases your body's ability to fight infections. Try to avoid being around people who are sick. This medicine may increase your risk to bruise or bleed. Call your doctor or health care professional if you notice any unusual bleeding. Be careful brushing and flossing your teeth or using a toothpick because you may get an infection or bleed more easily. If you have any dental work done, tell your dentist you are receiving this medicine. Avoid taking products that contain aspirin, acetaminophen, ibuprofen, naproxen, or ketoprofen unless instructed by your doctor. These medicines may hide a fever. Do not become pregnant while taking this medicine. Women should inform their doctor if they wish to become pregnant or think they might be pregnant. There is a potential for serious side effects to an unborn child. Talk to  your health care professional or pharmacist for more information. Do not breast-feed an infant while taking this medicine. Men are advised not to father a child while receiving this medicine. What side effects may I notice from receiving this medicine? Side effects that you should report to your doctor or health care professional as soon as possible: -allergic reactions like skin rash, itching or hives, swelling of the face, lips, or tongue -low blood counts - This drug may decrease the number of white blood cells, red blood cells and platelets. You may be at increased risk for infections and bleeding. -signs of infection - fever or chills, cough, sore throat, pain or difficulty passing urine -signs of decreased platelets or bleeding - bruising, pinpoint red spots on the skin, black, tarry stools, nosebleeds -signs of decreased red blood cells - unusually weak or tired, fainting spells, lightheadedness -breathing problems -chest pain -high or low blood pressure -mouth sores -nausea and vomiting -pain, swelling, redness or irritation at the injection site -pain, tingling, numbness in the hands or feet -slow or irregular heartbeat -swelling of the ankle, feet, hands Side effects that usually do not require medical attention (report to your doctor or health care professional if they continue or are bothersome): -bone pain -complete hair loss including hair on your head, underarms, pubic hair, eyebrows, and eyelashes -changes in the color of fingernails -diarrhea -loosening of the fingernails -loss of appetite -muscle or joint pain -red  flush to skin -sweating This list may not describe all possible side effects. Call your doctor for medical advice about side effects. You may report side effects to FDA at 1-800-FDA-1088. Where should I keep my medicine? This drug is given in a hospital or clinic and will not be stored at home. NOTE: This sheet is a summary. It may not cover all possible  information. If you have questions about this medicine, talk to your doctor, pharmacist, or health care provider.  2012, Elsevier/Gold Standard. (05/23/2008 11:54:26 AM)Carboplatin injection What is this medicine? CARBOPLATIN (KAR boe pla tin) is a chemotherapy drug. It targets fast dividing cells, like cancer cells, and causes these cells to die. This medicine is used to treat ovarian cancer and many other cancers. This medicine may be used for other purposes; ask your health care provider or pharmacist if you have questions. What should I tell my health care provider before I take this medicine? They need to know if you have any of these conditions: -blood disorders -hearing problems -kidney disease -recent or ongoing radiation therapy -an unusual or allergic reaction to carboplatin, cisplatin, other chemotherapy, other medicines, foods, dyes, or preservatives -pregnant or trying to get pregnant -breast-feeding How should I use this medicine? This drug is usually given as an infusion into a vein. It is administered in a hospital or clinic by a specially trained health care professional. Talk to your pediatrician regarding the use of this medicine in children. Special care may be needed. Overdosage: If you think you have taken too much of this medicine contact a poison control center or emergency room at once. NOTE: This medicine is only for you. Do not share this medicine with others. What if I miss a dose? It is important not to miss a dose. Call your doctor or health care professional if you are unable to keep an appointment. What may interact with this medicine? -medicines for seizures -medicines to increase blood counts like filgrastim, pegfilgrastim, sargramostim -some antibiotics like amikacin, gentamicin, neomycin, streptomycin, tobramycin -vaccines Talk to your doctor or health care professional before taking any of these  medicines: -acetaminophen -aspirin -ibuprofen -ketoprofen -naproxen This list may not describe all possible interactions. Give your health care provider a list of all the medicines, herbs, non-prescription drugs, or dietary supplements you use. Also tell them if you smoke, drink alcohol, or use illegal drugs. Some items may interact with your medicine. What should I watch for while using this medicine? Your condition will be monitored carefully while you are receiving this medicine. You will need important blood work done while you are taking this medicine. This drug may make you feel generally unwell. This is not uncommon, as chemotherapy can affect healthy cells as well as cancer cells. Report any side effects. Continue your course of treatment even though you feel ill unless your doctor tells you to stop. In some cases, you may be given additional medicines to help with side effects. Follow all directions for their use. Call your doctor or health care professional for advice if you get a fever, chills or sore throat, or other symptoms of a cold or flu. Do not treat yourself. This drug decreases your body's ability to fight infections. Try to avoid being around people who are sick. This medicine may increase your risk to bruise or bleed. Call your doctor or health care professional if you notice any unusual bleeding. Be careful brushing and flossing your teeth or using a toothpick because you may get an infection  or bleed more easily. If you have any dental work done, tell your dentist you are receiving this medicine. Avoid taking products that contain aspirin, acetaminophen, ibuprofen, naproxen, or ketoprofen unless instructed by your doctor. These medicines may hide a fever. Do not become pregnant while taking this medicine. Women should inform their doctor if they wish to become pregnant or think they might be pregnant. There is a potential for serious side effects to an unborn child. Talk to  your health care professional or pharmacist for more information. Do not breast-feed an infant while taking this medicine. What side effects may I notice from receiving this medicine? Side effects that you should report to your doctor or health care professional as soon as possible: -allergic reactions like skin rash, itching or hives, swelling of the face, lips, or tongue -signs of infection - fever or chills, cough, sore throat, pain or difficulty passing urine -signs of decreased platelets or bleeding - bruising, pinpoint red spots on the skin, black, tarry stools, nosebleeds -signs of decreased red blood cells - unusually weak or tired, fainting spells, lightheadedness -breathing problems -changes in hearing -changes in vision -chest pain -high blood pressure -low blood counts - This drug may decrease the number of white blood cells, red blood cells and platelets. You may be at increased risk for infections and bleeding. -nausea and vomiting -pain, swelling, redness or irritation at the injection site -pain, tingling, numbness in the hands or feet -problems with balance, talking, walking -trouble passing urine or change in the amount of urine Side effects that usually do not require medical attention (report to your doctor or health care professional if they continue or are bothersome): -hair loss -loss of appetite -metallic taste in the mouth or changes in taste This list may not describe all possible side effects. Call your doctor for medical advice about side effects. You may report side effects to FDA at 1-800-FDA-1088. Where should I keep my medicine? This drug is given in a hospital or clinic and will not be stored at home. NOTE: This sheet is a summary. It may not cover all possible information. If you have questions about this medicine, talk to your doctor, pharmacist, or health care provider.  2012, Elsevier/Gold Standard. (09/15/2007 2:38:05 PM)

## 2013-04-02 NOTE — Progress Notes (Signed)
This office note has been dictated.

## 2013-04-05 LAB — COMPREHENSIVE METABOLIC PANEL
BUN: 11 mg/dL (ref 6–23)
CO2: 26 mEq/L (ref 19–32)
Creatinine, Ser: 1.39 mg/dL — ABNORMAL HIGH (ref 0.50–1.10)
Glucose, Bld: 87 mg/dL (ref 70–99)
Sodium: 138 mEq/L (ref 135–145)
Total Bilirubin: 0.3 mg/dL (ref 0.3–1.2)
Total Protein: 6.1 g/dL (ref 6.0–8.3)

## 2013-04-05 LAB — LACTATE DEHYDROGENASE: LDH: 148 U/L (ref 94–250)

## 2013-04-05 LAB — BETA HCG QUANT (REF LAB): Beta hCG, Tumor Marker: 6.6 m[IU]/mL — ABNORMAL HIGH (ref <5.0)

## 2013-04-05 LAB — AFP TUMOR MARKER: AFP-Tumor Marker: 5.4 ng/mL (ref 0.0–8.0)

## 2013-04-05 LAB — TESTOSTERONE: Testosterone: 24 ng/dL (ref 10–70)

## 2013-04-05 NOTE — Addendum Note (Signed)
Addended by: Josph Macho on: 04/05/2013 06:25 PM   Modules accepted: Orders

## 2013-04-07 ENCOUNTER — Other Ambulatory Visit: Payer: Self-pay | Admitting: *Deleted

## 2013-04-07 DIAGNOSIS — D3910 Neoplasm of uncertain behavior of unspecified ovary: Secondary | ICD-10-CM

## 2013-04-07 DIAGNOSIS — M898X9 Other specified disorders of bone, unspecified site: Secondary | ICD-10-CM

## 2013-04-07 MED ORDER — MELOXICAM 15 MG PO TABS: 15.0000 mg | ORAL_TABLET | Freq: Every day | ORAL | Status: DC

## 2013-04-07 NOTE — Telephone Encounter (Signed)
Pt called with c/o "hard, throbbing pain in her arms, legs, and feet. It travels around". She had some pain with her 1st cycle of chemo but it really bothered her after the 2nd one on 04/02/13. Has been taking Vicodin 2 tabs every 4 hours with about 1 hour of relief. Reviewed with Dr Myna Hidalgo. To try Mobic 15 mg PO daily. Ok to continue Vicodin but to try to cut back once the Mobic starts to work. Asked the pt not to take any additional NSAIDS ie Aleve or Advil. She verbalized understanding and knows to call if her pain worsens or doesn't improve within a few days.

## 2013-04-07 NOTE — Progress Notes (Signed)
CC:   Tammy R. Collins Scotland, M.D. Guy Sandifer Henderson Cloud, M.D. Paola A. 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  CURRENT THERAPY:  The patient is status post cycle 1 of Taxol/carboplatin.  INTERIM HISTORY:  Ms. Henault comes in for her followup.  She tolerated her first cycle of chemotherapy fairly well.  She has lost her hair.  She has a little bit of tingling in the fingers.  We will have to watch this. She has had no cough.  There has been no rashes.  She has had no headache.  There was no mouth sores.  When we last saw her, we did her tumor markers.  Everything came back normal, without fetoprotein, beta HCG, and LDH.  PHYSICAL EXAMINATION:  General:  This is a well-developed, well- nourished white female in no obvious distress.  Vital signs: Temperature of 98.2, pulse 82, respiratory rate 14, blood pressure 98/62.  Weight is 198 pounds.  Head and neck:  Normocephalic, atraumatic skull.  There are no ocular or oral lesions.  There are no palpable cervical or supraclavicular lymph nodes.  Lungs:  Clear bilaterally. Cardiac exam:  Shows regular rate and rhythm with occasional extra beat. She has a 1/6 systolic ejection murmur.  Abdomen:  Soft.  She has a well- healed laparotomy scar.  There is no fluid wave.  There is no guarding or rebound tenderness.  There is no palpable hepatosplenomegaly. Extremities:  Show no clubbing, cyanosis or edema.  Skin:  No rashes, ecchymosis, or petechia.  LABORATORY STUDIES:  White cell count is 9.6, hemoglobin 12.6, hematocrit 37.3, platelet count 128.  Alpha fetoprotein is 5.4.  Beta HCG is 6.6.  LDH is 148.  IMPRESSION:  Mr. Dannenberg is a very nice 52 year old white female with recurrent granulosa cell tumor of the ovary.  She has had 1 of 6 cycles of chemotherapy with carboplatin/Taxol.  I am not sure what to make of this beta HCG elevation.  We are going to have to watch this closely while we get her back.  We will have her  plan to come back in another 3 weeks for a 3rd cycle of chemotherapy.    ______________________________ Josph Macho, M.D. PRE/MEDQ  D:  04/05/2013  T:  04/06/2013  Job:  [id number]

## 2013-04-16 ENCOUNTER — Ambulatory Visit: Payer: BC Managed Care – PPO | Attending: Gynecology | Admitting: Gynecology

## 2013-04-16 ENCOUNTER — Ambulatory Visit: Payer: BC Managed Care – PPO | Admitting: Lab

## 2013-04-16 VITALS — BP 120/85 | HR 79 | Temp 99.2°F | Resp 16 | Ht 66.0 in | Wt 200.1 lb

## 2013-04-16 DIAGNOSIS — D3910 Neoplasm of uncertain behavior of unspecified ovary: Secondary | ICD-10-CM | POA: Insufficient documentation

## 2013-04-16 DIAGNOSIS — C569 Malignant neoplasm of unspecified ovary: Secondary | ICD-10-CM

## 2013-04-16 DIAGNOSIS — Z79899 Other long term (current) drug therapy: Secondary | ICD-10-CM | POA: Insufficient documentation

## 2013-04-16 NOTE — Progress Notes (Signed)
Consult Note: Gyn-Onc   [name] 52 y.o. female  Chief Complaint  Patient presents with  . Granulaosa cell tumor    Follow up     Assessment : Recurrent granulosa cell tumor. Status post 2 cycles of carboplatin Taxol chemotherapy.  Plan:   We will obtain an inhibin B. and AMH  today as these are the best tumor markers for a granulosa cell tumor. She will continue in the care of Dr Myna Hidalgo continuing carboplatin and Taxol chemotherapy. She returned to see me nearing the end of her 6 cycles of chemotherapy in early January.    Interval history: The patient returns today for continuing followup as previously scheduled. She is now received 2 cycles of carboplatin and Taxol. She reports she has approximately one week of nausea after each chemotherapy cycle. In addition it sounds as though she is having early peripheral neuropathy in her hands and feet. She is on vitamin B6. She has not had any significant difficulty with nadir counts. To date I do not see any monitoring of serum tumor markers (inhibin B. and AMH.)  She denies any GI or GU symptoms. Specifically her bladder seems to be doing fine. She denies any flank pain.   HPI: 52 year old white female seen in consultation request of Dr. Junie Panning regarding management of recurrent granulosa cell tumor. Patient first underwent surgery in 2007 vitamin stage I C. granulosa cell tumor. She received 3 cycles of bleomycin, etoposide, and cisplatin chemotherapy as an adjunct.  Recently the patient developed a lower nominal pain and noticed a "knot" in the region of her prior Pfannenstiel incision. A CT scan was obtained showing a large mass in the anterior abdominal wall as well as 2 other masses in the pelvis. The anterior mass measures 9.5 x 10 x 11.5 cm with cystic and solid components. In addition there 2 masses in the pelvis measuring 5.3 x 4.3 and 5.5 x 5.0 cm. A fine-needle biopsy of the mass is then obtained confirming recurrent  granulosa cell tumor. The mass seems to extend into the anterior abdominal wall in the pelvis.  She underwent radical debulking of her recurrent granulosa cell tumor on 01/20/2013. All disease was resected. This required a low rectal anastomosis and resection of distal ureter with reimplantation.  Postoperatively, adjuvant therapy was recommended using RO plan and Taxol.   Review of Systems:10 point review of systems is negative except as noted in interval history.   Vitals: Blood pressure 120/85, pulse 79, temperature 99.2 F (37.3 C), temperature source Oral, resp. rate 16, height 5\' 6"  (1.676 m), weight 200 lb 1.6 oz (90.765 kg).  Physical Exam: General : The patient is a healthy woman in no acute distress.  HEENT: normocephalic, extraoccular movements normal; neck is supple without thyromegally  Lynphnodes: Supraclavicular and inguinal nodes not enlarged  Abdomen: Soft, non-tender, no ascites, no organomegally, midline incision is nearly completely healed with minimal granulation tissue.  Pelvic:  Deferred   Lower extremities: No edema or varicosities. Normal range of motion      Allergies  Allergen Reactions  . Latex Hives, Rash and Other (See Comments)    LATEX TAPE "makes almost a raw spot"; tolerates latex gloves; tolerates paper tape  . Prochlorperazine Edisylate Other (See Comments)    Nervous/ flutter/ shakes--compazine    Past Medical History  Diagnosis Date  . Arrhythmia     ventricular tachycardia  . Unspecified systolic heart failure   . Ventricular fibrillation   . Coronary artery disease   .  ICD (implantable cardiac defibrillator) in place   . Pacemaker   . High cholesterol   . CHF (congestive heart failure)   . Myocardial infarction 2011  . Hypothyroidism   . Arthritis 02/06/2012    "everywhere"  . Bladder cancer 2009    "injected medicine to get rid of it"  . Granulosa cell carcinoma of ovary 2007    right; "had chemo"  . Pneumonia     as child     Past Surgical History  Procedure Laterality Date  . Cardiac defibrillator placement  02/06/2012    "lead change"  . Insert / replace / remove pacemaker  10/2009    DUAL-CHAMBER St[employer] JUDE DEFIBRILLATOR  . Tonsillectomy and adenoidectomy  ~ 1967  . Appendectomy  1989  . Vaginal hysterectomy  2001  . Tubal ligation  1993  . Bilateral oophorectomy  2007  . Mitral valve replacement  2011    "pig valve"  . Cardiac valve replacement    . Transurethral resection of bladder  2009    "for bladder cancer"  . Thymus removed    . Total hip arthroplasty  05/06/2012    Procedure: TOTAL HIP ARTHROPLASTY;  Surgeon: Loanne Drilling, MD;  Location: [facility];  Service: Orthopedics;  Laterality: Right;  . Laparotomy N/A 01/19/2013    Procedure: TUMOR DEBULKING / BOWEL RESECTION/INSERTION RIGHT UTERERAL DOUBLE J STENT/OMENTECTOMY;  Surgeon: Jeannette Corpus, MD;  Location: [facility];  Service: Gynecology;  Laterality: N/A;  . Cystoscopy Right 02/23/2013    Procedure: CYSTOSCOPY WITH STENT REMOVAL;  Surgeon: Jeannette Corpus, MD;  Location: [facility];  Service: Gynecology;  Laterality: Right;    Current Outpatient Prescriptions  Medication Sig Dispense Refill  . amiodarone (PACERONE) 200 MG tablet every morning. 1/2 tablet by mouth daily      . atorvastatin (LIPITOR) 40 MG tablet Take 40 mg by mouth every evening.       . carvedilol (COREG) 12.5 MG tablet Take 12.5 mg by mouth 2 (two) times daily with a meal.      . cholecalciferol (VITAMIN D) 1000 UNITS tablet Take 1,000 Units by mouth 2 (two) times daily.      Marland Kitchen dexamethasone (DECADRON) 4 MG tablet Take 2 tablets (8 mg total) by mouth 2 (two) times daily with a meal. Take two times a day starting the day after chemotherapy for 3 days.  30 tablet  3  . fish oil-omega-3 fatty acids 1000 MG capsule Take 1 g by mouth 2 (two) times daily.      Marland Kitchen HYDROcodone-acetaminophen (NORCO/VICODIN) 5-325 MG per tablet Take 1 tablet by mouth every 6 (six) hours as  needed for pain.  90 tablet  0  . levothyroxine (SYNTHROID, LEVOTHROID) 75 MCG tablet Take 75 mcg by mouth daily before breakfast.       . lidocaine-prilocaine (EMLA) cream Apply topically as needed. Place a cotton ball size amt to port 45 min prior to use & cover with plastic wrap.  30 g  2  . LORazepam (ATIVAN) 0.5 MG tablet Take 1 tablet (0.5 mg total) by mouth every 6 (six) hours as needed (Nausea or vomiting).  60 tablet  2  . meloxicam (MOBIC) 15 MG tablet Take 1 tablet (15 mg total) by mouth daily.  30 tablet  2  . Multiple Vitamins-Minerals (MULTIVITAMIN PO) Take 1 tablet by mouth every morning.       . NON FORMULARY Take by mouth 2 (two) times daily. Evening primrose      .  ondansetron (ZOFRAN) 8 MG tablet Take 1 tablet (8 mg total) by mouth 2 (two) times daily. Take two times a day starting the day after chemo for 3 days. Then take two times a day as needed for nausea or vomiting.  30 tablet  4  . spironolactone (ALDACTONE) 25 MG tablet Take 25 mg by mouth 2 (two) times daily.       Marland Kitchen zolpidem (AMBIEN) 5 MG tablet Take 1 tablet (5 mg total) by mouth at bedtime as needed for sleep.  30 tablet  2   No current facility-administered medications for this visit.    History   Social History  . Marital Status: Divorced    Spouse Name: N/A    Number of Children: N/A  . Years of Education: N/A   Occupational History  .      WORKS FULL TIME   Social History Main Topics  . Smoking status: Current Some Day Smoker -- 0.25 packs/day for 30 years    Types: Cigarettes  . Smokeless tobacco: Never Used  . Alcohol Use: No  . Drug Use: No  . Sexual Activity: Not Currently   Other Topics Concern  . Not on file   Social History Narrative   WORKS FULL TIME   SINGLE   TOBACCO USE-YES   IMPLANTATION OF DUAL-CHAMBER [employer]DE DEFIBRILLATOR    No family history on file.    Jeannette Corpus, MD 04/16/2013, 3:59 PM

## 2013-04-16 NOTE — Patient Instructions (Signed)
Return to see me in January. We'll contact you with results of her laboratory work from today.

## 2013-04-23 ENCOUNTER — Ambulatory Visit (HOSPITAL_BASED_OUTPATIENT_CLINIC_OR_DEPARTMENT_OTHER): Payer: BC Managed Care – PPO | Admitting: Hematology & Oncology

## 2013-04-23 ENCOUNTER — Other Ambulatory Visit (HOSPITAL_BASED_OUTPATIENT_CLINIC_OR_DEPARTMENT_OTHER): Payer: BC Managed Care – PPO | Admitting: Lab

## 2013-04-23 ENCOUNTER — Ambulatory Visit (HOSPITAL_BASED_OUTPATIENT_CLINIC_OR_DEPARTMENT_OTHER): Payer: BC Managed Care – PPO

## 2013-04-23 VITALS — BP 108/64 | HR 81 | Temp 98.1°F | Resp 14 | Ht 66.0 in | Wt 199.0 lb

## 2013-04-23 DIAGNOSIS — D3910 Neoplasm of uncertain behavior of unspecified ovary: Secondary | ICD-10-CM

## 2013-04-23 DIAGNOSIS — Z5111 Encounter for antineoplastic chemotherapy: Secondary | ICD-10-CM

## 2013-04-23 LAB — CBC WITH DIFFERENTIAL (CANCER CENTER ONLY)
BASO#: 0 10*3/uL (ref 0.0–0.2)
Eosinophils Absolute: 0 10*3/uL (ref 0.0–0.5)
HCT: 33.9 % — ABNORMAL LOW (ref 34.8–46.6)
HGB: 11.2 g/dL — ABNORMAL LOW (ref 11.6–15.9)
LYMPH%: 34.3 % (ref 14.0–48.0)
MCV: 97 fL (ref 81–101)
MONO#: 0.7 10*3/uL (ref 0.1–0.9)
NEUT%: 56.7 % (ref 39.6–80.0)
Platelets: 83 10*3/uL — ABNORMAL LOW (ref 145–400)
RBC: 3.51 10*6/uL — ABNORMAL LOW (ref 3.70–5.32)
WBC: 8.3 10*3/uL (ref 3.9–10.0)

## 2013-04-23 MED ORDER — SODIUM CHLORIDE 0.9 % IV SOLN
Freq: Once | INTRAVENOUS | Status: AC
  Administered 2013-04-23: 09:00:00 via INTRAVENOUS

## 2013-04-23 MED ORDER — DEXAMETHASONE SODIUM PHOSPHATE 20 MG/5ML IJ SOLN
INTRAMUSCULAR | Status: AC
  Filled 2013-04-23: qty 5

## 2013-04-23 MED ORDER — ALTEPLASE 2 MG IJ SOLR
2.0000 mg | Freq: Once | INTRAMUSCULAR | Status: DC | PRN
  Filled 2013-04-23: qty 2

## 2013-04-23 MED ORDER — FAMOTIDINE IN NACL 20-0.9 MG/50ML-% IV SOLN
20.0000 mg | Freq: Once | INTRAVENOUS | Status: AC
  Administered 2013-04-23: 20 mg via INTRAVENOUS

## 2013-04-23 MED ORDER — DEXAMETHASONE SODIUM PHOSPHATE 20 MG/5ML IJ SOLN
20.0000 mg | Freq: Once | INTRAMUSCULAR | Status: AC
  Administered 2013-04-23: 20 mg via INTRAVENOUS

## 2013-04-23 MED ORDER — SODIUM CHLORIDE 0.9 % IJ SOLN
10.0000 mL | INTRAMUSCULAR | Status: DC | PRN
  Administered 2013-04-23: 10 mL
  Filled 2013-04-23: qty 10

## 2013-04-23 MED ORDER — HEPARIN SOD (PORK) LOCK FLUSH 100 UNIT/ML IV SOLN
500.0000 [IU] | Freq: Once | INTRAVENOUS | Status: AC | PRN
  Administered 2013-04-23: 500 [IU]
  Filled 2013-04-23: qty 5

## 2013-04-23 MED ORDER — SODIUM CHLORIDE 0.9 % IV SOLN
500.0000 mg | Freq: Once | INTRAVENOUS | Status: AC
  Administered 2013-04-23: 500 mg via INTRAVENOUS
  Filled 2013-04-23: qty 50

## 2013-04-23 MED ORDER — DIPHENHYDRAMINE HCL 50 MG/ML IJ SOLN
50.0000 mg | Freq: Once | INTRAMUSCULAR | Status: AC
  Administered 2013-04-23: 50 mg via INTRAVENOUS

## 2013-04-23 MED ORDER — STERILE WATER FOR INJECTION IJ SOLN
INTRAMUSCULAR | Status: AC
  Filled 2013-04-23: qty 10

## 2013-04-23 MED ORDER — FAMOTIDINE IN NACL 20-0.9 MG/50ML-% IV SOLN
INTRAVENOUS | Status: AC
  Filled 2013-04-23: qty 50

## 2013-04-23 MED ORDER — ALTEPLASE 2 MG IJ SOLR
INTRAMUSCULAR | Status: AC
  Filled 2013-04-23: qty 2

## 2013-04-23 MED ORDER — PACLITAXEL CHEMO INJECTION 300 MG/50ML
157.5000 mg/m2 | Freq: Once | INTRAVENOUS | Status: AC
  Administered 2013-04-23: 300 mg via INTRAVENOUS
  Filled 2013-04-23: qty 50

## 2013-04-23 MED ORDER — ONDANSETRON 16 MG/50ML IVPB (CHCC)
INTRAVENOUS | Status: AC
  Filled 2013-04-23: qty 16

## 2013-04-23 MED ORDER — ONDANSETRON 16 MG/50ML IVPB (CHCC)
16.0000 mg | Freq: Once | INTRAVENOUS | Status: AC
  Administered 2013-04-23: 16 mg via INTRAVENOUS

## 2013-04-23 MED ORDER — DIPHENHYDRAMINE HCL 50 MG/ML IJ SOLN
INTRAMUSCULAR | Status: AC
  Filled 2013-04-23: qty 1

## 2013-04-23 NOTE — Patient Instructions (Signed)
[facility] Discharge Instructions for Patients Receiving Chemotherapy  Today you received chemotherapy agents.  Carboplatin and Taxol.  To help prevent nausea and vomiting after your treatment, we encourage you to take your nausea medication as prescribed. If you develop nausea and vomiting that is not controlled by your nausea medication, call the clinic. If it is after clinic hours your family physician or the after hours number for the clinic or go to the Emergency Department.   BELOW ARE SYMPTOMS THAT SHOULD BE REPORTED IMMEDIATELY:  *FEVER GREATER THAN 100.5 F  *CHILLS WITH OR WITHOUT FEVER  NAUSEA AND VOMITING THAT IS NOT CONTROLLED WITH YOUR NAUSEA MEDICATION  *UNUSUAL SHORTNESS OF BREATH  *UNUSUAL BRUISING OR BLEEDING  TENDERNESS IN MOUTH AND THROAT WITH OR WITHOUT PRESENCE OF ULCERS  *URINARY PROBLEMS  *BOWEL PROBLEMS  UNUSUAL RASH Items with * indicate a potential emergency and should be followed up as soon as possible.   Please let the nurse know about any problems that you may have experienced. Feel free to call the clinic you have any questions or concerns. The clinic phone number is [phone].   I have been informed and understand all the instructions given to me. I know to contact the clinic, my physician, or go to the Emergency Department if any problems should occur. I do not have any questions at this time, but understand that I may call the clinic during office hours   should I have any questions or need assistance in obtaining follow up care.    __________________________________________  _____________  __________ Signature of Patient or Authorized Representative            Date                   Time    __________________________________________ Nurse's Signature   Paclitaxel injection What is this medicine? PACLITAXEL (PAK li TAX el) is a chemotherapy drug. It targets fast dividing cells, like cancer cells, and causes these  cells to die. This medicine is used to treat ovarian cancer, breast cancer, and other cancers. This medicine may be used for other purposes; ask your health care provider or pharmacist if you have questions. What should I tell my health care provider before I take this medicine? They need to know if you have any of these conditions: -blood disorders -irregular heartbeat -infection (especially a virus infection such as chickenpox, cold sores, or herpes) -liver disease -previous or ongoing radiation therapy -an unusual or allergic reaction to paclitaxel, alcohol, polyoxyethylated castor oil, other chemotherapy agents, other medicines, foods, dyes, or preservatives -pregnant or trying to get pregnant -breast-feeding How should I use this medicine? This drug is given as an infusion into a vein. It is administered in a hospital or clinic by a specially trained health care professional. Talk to your pediatrician regarding the use of this medicine in children. Special care may be needed. Overdosage: If you think you have taken too much of this medicine contact a poison control center or emergency room at once. NOTE: This medicine is only for you. Do not share this medicine with others. What if I miss a dose? It is important not to miss your dose. Call your doctor or health care professional if you are unable to keep an appointment. What may interact with this medicine? Do not take this medicine with any of the following medications: -disulfiram -metronidazole This medicine may also interact with the following medications: -cyclosporine -dexamethasone -diazepam -ketoconazole -medicines to increase   blood counts like filgrastim, pegfilgrastim, sargramostim -other chemotherapy drugs like cisplatin, doxorubicin, epirubicin, etoposide, teniposide, vincristine -quinidine -testosterone -vaccines -verapamil Talk to your doctor or health care professional before taking any of these  medicines: -acetaminophen -aspirin -ibuprofen -ketoprofen -naproxen This list may not describe all possible interactions. Give your health care provider a list of all the medicines, herbs, non-prescription drugs, or dietary supplements you use. Also tell them if you smoke, drink alcohol, or use illegal drugs. Some items may interact with your medicine. What should I watch for while using this medicine? Your condition will be monitored carefully while you are receiving this medicine. You will need important blood work done while you are taking this medicine. This drug may make you feel generally unwell. This is not uncommon, as chemotherapy can affect healthy cells as well as cancer cells. Report any side effects. Continue your course of treatment even though you feel ill unless your doctor tells you to stop. In some cases, you may be given additional medicines to help with side effects. Follow all directions for their use. Call your doctor or health care professional for advice if you get a fever, chills or sore throat, or other symptoms of a cold or flu. Do not treat yourself. This drug decreases your body's ability to fight infections. Try to avoid being around people who are sick. This medicine may increase your risk to bruise or bleed. Call your doctor or health care professional if you notice any unusual bleeding. Be careful brushing and flossing your teeth or using a toothpick because you may get an infection or bleed more easily. If you have any dental work done, tell your dentist you are receiving this medicine. Avoid taking products that contain aspirin, acetaminophen, ibuprofen, naproxen, or ketoprofen unless instructed by your doctor. These medicines may hide a fever. Do not become pregnant while taking this medicine. Women should inform their doctor if they wish to become pregnant or think they might be pregnant. There is a potential for serious side effects to an unborn child. Talk to  your health care professional or pharmacist for more information. Do not breast-feed an infant while taking this medicine. Men are advised not to father a child while receiving this medicine. What side effects may I notice from receiving this medicine? Side effects that you should report to your doctor or health care professional as soon as possible: -allergic reactions like skin rash, itching or hives, swelling of the face, lips, or tongue -low blood counts - This drug may decrease the number of white blood cells, red blood cells and platelets. You may be at increased risk for infections and bleeding. -signs of infection - fever or chills, cough, sore throat, pain or difficulty passing urine -signs of decreased platelets or bleeding - bruising, pinpoint red spots on the skin, black, tarry stools, nosebleeds -signs of decreased red blood cells - unusually weak or tired, fainting spells, lightheadedness -breathing problems -chest pain -high or low blood pressure -mouth sores -nausea and vomiting -pain, swelling, redness or irritation at the injection site -pain, tingling, numbness in the hands or feet -slow or irregular heartbeat -swelling of the ankle, feet, hands Side effects that usually do not require medical attention (report to your doctor or health care professional if they continue or are bothersome): -bone pain -complete hair loss including hair on your head, underarms, pubic hair, eyebrows, and eyelashes -changes in the color of fingernails -diarrhea -loosening of the fingernails -loss of appetite -muscle or joint pain -red   flush to skin -sweating This list may not describe all possible side effects. Call your doctor for medical advice about side effects. You may report side effects to FDA at 1-800-FDA-1088. Where should I keep my medicine? This drug is given in a hospital or clinic and will not be stored at home. NOTE: This sheet is a summary. It may not cover all possible  information. If you have questions about this medicine, talk to your doctor, pharmacist, or health care provider.  2012, [employer]vier/Gold Standard. (05/23/2008 11:54:26 AM)Carboplatin injection What is this medicine? CARBOPLATIN (KAR boe pla tin) is a chemotherapy drug. It targets fast dividing cells, like cancer cells, and causes these cells to die. This medicine is used to treat ovarian cancer and many other cancers. This medicine may be used for other purposes; ask your health care provider or pharmacist if you have questions. What should I tell my health care provider before I take this medicine? They need to know if you have any of these conditions: -blood disorders -hearing problems -kidney disease -recent or ongoing radiation therapy -an unusual or allergic reaction to carboplatin, cisplatin, other chemotherapy, other medicines, foods, dyes, or preservatives -pregnant or trying to get pregnant -breast-feeding How should I use this medicine? This drug is usually given as an infusion into a vein. It is administered in a hospital or clinic by a specially trained health care professional. Talk to your pediatrician regarding the use of this medicine in children. Special care may be needed. Overdosage: If you think you have taken too much of this medicine contact a poison control center or emergency room at once. NOTE: This medicine is only for you. Do not share this medicine with others. What if I miss a dose? It is important not to miss a dose. Call your doctor or health care professional if you are unable to keep an appointment. What may interact with this medicine? -medicines for seizures -medicines to increase blood counts like filgrastim, pegfilgrastim, sargramostim -some antibiotics like amikacin, gentamicin, neomycin, streptomycin, tobramycin -vaccines Talk to your doctor or health care professional before taking any of these  medicines: -acetaminophen -aspirin -ibuprofen -ketoprofen -naproxen This list may not describe all possible interactions. Give your health care provider a list of all the medicines, herbs, non-prescription drugs, or dietary supplements you use. Also tell them if you smoke, drink alcohol, or use illegal drugs. Some items may interact with your medicine. What should I watch for while using this medicine? Your condition will be monitored carefully while you are receiving this medicine. You will need important blood work done while you are taking this medicine. This drug may make you feel generally unwell. This is not uncommon, as chemotherapy can affect healthy cells as well as cancer cells. Report any side effects. Continue your course of treatment even though you feel ill unless your doctor tells you to stop. In some cases, you may be given additional medicines to help with side effects. Follow all directions for their use. Call your doctor or health care professional for advice if you get a fever, chills or sore throat, or other symptoms of a cold or flu. Do not treat yourself. This drug decreases your body's ability to fight infections. Try to avoid being around people who are sick. This medicine may increase your risk to bruise or bleed. Call your doctor or health care professional if you notice any unusual bleeding. Be careful brushing and flossing your teeth or using a toothpick because you may get an infection   or bleed more easily. If you have any dental work done, tell your dentist you are receiving this medicine. Avoid taking products that contain aspirin, acetaminophen, ibuprofen, naproxen, or ketoprofen unless instructed by your doctor. These medicines may hide a fever. Do not become pregnant while taking this medicine. Women should inform their doctor if they wish to become pregnant or think they might be pregnant. There is a potential for serious side effects to an unborn child. Talk to  your health care professional or pharmacist for more information. Do not breast-feed an infant while taking this medicine. What side effects may I notice from receiving this medicine? Side effects that you should report to your doctor or health care professional as soon as possible: -allergic reactions like skin rash, itching or hives, swelling of the face, lips, or tongue -signs of infection - fever or chills, cough, sore throat, pain or difficulty passing urine -signs of decreased platelets or bleeding - bruising, pinpoint red spots on the skin, black, tarry stools, nosebleeds -signs of decreased red blood cells - unusually weak or tired, fainting spells, lightheadedness -breathing problems -changes in hearing -changes in vision -chest pain -high blood pressure -low blood counts - This drug may decrease the number of white blood cells, red blood cells and platelets. You may be at increased risk for infections and bleeding. -nausea and vomiting -pain, swelling, redness or irritation at the injection site -pain, tingling, numbness in the hands or feet -problems with balance, talking, walking -trouble passing urine or change in the amount of urine Side effects that usually do not require medical attention (report to your doctor or health care professional if they continue or are bothersome): -hair loss -loss of appetite -metallic taste in the mouth or changes in taste This list may not describe all possible side effects. Call your doctor for medical advice about side effects. You may report side effects to FDA at 1-800-FDA-1088. Where should I keep my medicine? This drug is given in a hospital or clinic and will not be stored at home. NOTE: This sheet is a summary. It may not cover all possible information. If you have questions about this medicine, talk to your doctor, pharmacist, or health care provider.  2012, [employer]vier/Gold Standard. (09/15/2007 2:38:05 PM) 

## 2013-04-23 NOTE — Progress Notes (Signed)
This office note has been dictated.

## 2013-04-24 NOTE — Progress Notes (Signed)
CC:   [name], M.D.  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  CURRENT THERAPY:  The patient is status post 2 cycles of carboplatin/Taxol.  INTERIM HISTORY:  [name] comes in for followup.  She saw Dr. Stanford Ritter last week.  He is very pleased with how well she is doing.  He sent off an antimullerian hormone level.  This was normal at less than 0.03.  He also sent off an inhibin B level.  This was normal at 19.  She has done okay.  She does have a tough time the first week of chemotherapy.  She has a lot of nausea and vomiting.  We may want to try her on Zyprexa.  This has been shown to help with chemotherapy-induced nausea and vomiting. She has had no abdominal pain.  She has had some slight spot tenderness in the abdomen.  This is just to the left of the umbilicus.  This seems to come and go.  She has had no problem with bowels or bladder.  She has had no cough or shortness of breath.  She has had no leg swelling.  There has been no bleeding.  There has been no mouth sores.  PHYSICAL EXAMINATION:  General:  This is a well-developed, well- nourished white female in no obvious distress.  Vital signs: Temperature of 98.1, pulse 81, respiratory rate 14, blood pressure 108/64.  Weight is 199 pounds.  Head and neck:  Normocephalic, atraumatic skull.  There are no ocular or oral lesions.  There are no palpable cervical or supraclavicular lymph nodes.  Lungs:  Clear to percussion and auscultation bilaterally.  Cardiac:  Regular rate and rhythm with a normal S1 and S2.  There are no murmurs, rubs, or bruits. Abdomen:  Soft.  She has a well-healed laparotomy scar.  There is no fluid wave.  There is no palpable abdominal mass.  There is no palpable hepatosplenomegaly.  Extremities:  No clubbing, cyanosis, or edema. Neurologic:  No focal neurological deficits.  LABORATORY STUDIES:  White cell count is 8.3, hemoglobin 11.2, hematocrit 33.9, platelet count  83,000.  IMPRESSION:  [name] is a very charming 52 year old white female with recurrent granulosa cell tumor of the ovary.  She underwent complete debulking.  She has done very well.  We are giving her adjuvant chemotherapy.  We will go ahead and plan for treatment today.  Even though her platelet count is on the low side, I think she should still be able to tolerate chemotherapy.  We will plan to get her back in 3 weeks' time.  Again, we will plan for 6 cycles of chemotherapy in total.    ______________________________ Josph Macho, M.D. PRE/MEDQ  D:  04/23/2013  T:  04/24/2013  Job:  [id number]

## 2013-04-26 LAB — BETA HCG QUANT (REF LAB): Beta hCG, Tumor Marker: 7 m[IU]/mL — ABNORMAL HIGH (ref <5.0)

## 2013-04-29 ENCOUNTER — Other Ambulatory Visit: Payer: Self-pay

## 2013-05-04 ENCOUNTER — Other Ambulatory Visit: Payer: Self-pay

## 2013-05-04 ENCOUNTER — Encounter: Payer: Self-pay | Admitting: Interventional Cardiology

## 2013-05-04 ENCOUNTER — Ambulatory Visit (INDEPENDENT_AMBULATORY_CARE_PROVIDER_SITE_OTHER): Payer: BC Managed Care – PPO | Admitting: Interventional Cardiology

## 2013-05-04 VITALS — BP 106/68 | HR 77 | Ht 66.0 in | Wt 193.0 lb

## 2013-05-04 DIAGNOSIS — I472 Ventricular tachycardia: Secondary | ICD-10-CM

## 2013-05-04 DIAGNOSIS — I2510 Atherosclerotic heart disease of native coronary artery without angina pectoris: Secondary | ICD-10-CM

## 2013-05-04 DIAGNOSIS — I428 Other cardiomyopathies: Secondary | ICD-10-CM

## 2013-05-04 DIAGNOSIS — D3910 Neoplasm of uncertain behavior of unspecified ovary: Secondary | ICD-10-CM

## 2013-05-04 DIAGNOSIS — Z79899 Other long term (current) drug therapy: Secondary | ICD-10-CM

## 2013-05-04 DIAGNOSIS — Z95810 Presence of automatic (implantable) cardiac defibrillator: Secondary | ICD-10-CM

## 2013-05-04 DIAGNOSIS — Z954 Presence of other heart-valve replacement: Secondary | ICD-10-CM

## 2013-05-04 MED ORDER — SPIRONOLACTONE 25 MG PO TABS: 25.0000 mg | ORAL_TABLET | Freq: Two times a day (BID) | ORAL | Status: DC

## 2013-05-04 MED ORDER — CARVEDILOL 12.5 MG PO TABS: 12.5000 mg | ORAL_TABLET | Freq: Two times a day (BID) | ORAL | Status: DC

## 2013-05-04 NOTE — Progress Notes (Signed)
Patient ID: [name], female   DOB: 1961-02-23, 52 y.o.   MRN: [id number]    [name], [address] [place], [place]  [address] Phone: [phone] Fax:  [phone]  Date:  05/04/2013   ID:  [name], DOB 1961/05/08, MRN [id number]  PCP:  Herb Grays, MD   ASSESSMENT:  1. 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 Coronary atherosclerosis, asymptomatic 3. Chronic combined systolic and diastolic heart failure, stable 4. Ovarian cancer on chemotherapy consisting of cisplatin and Taxol  PLAN:  1. No cardiac evaluation or change in therapy at this time. 2. Clinical followup in one year   SUBJECTIVE: [name] is a 52 y.o. female is tolerating chemotherapy for ovarian cancer. She has no cardiac complaints. She denies orthopnea, PND, and edema. There is mild exertional dyspnea. She has not had syncope, palpitations, or and AICD discharge. No cardiac medication side effects are noted.   Wt Readings from Last 3 Encounters:  05/04/13 193 lb (87.544 kg)  04/23/13 199 lb (90.266 kg)  04/16/13 200 lb 1.6 oz (90.765 kg)     Past Medical History  Diagnosis Date  . Arrhythmia     ventricular tachycardia  . Unspecified systolic heart failure   . Ventricular fibrillation   . Coronary artery disease   . ICD (implantable cardiac defibrillator) in place   . Pacemaker   . High cholesterol   . CHF (congestive heart failure)   . Myocardial infarction 2011  . Hypothyroidism   . Arthritis 02/06/2012    "everywhere"  . Bladder cancer 2009    "injected medicine to get rid of it"  . Granulosa cell carcinoma of ovary 2007    right; "had chemo"  . Pneumonia     as child    Current Outpatient Prescriptions  Medication Sig Dispense Refill  . amiodarone (PACERONE) 200 MG tablet every morning. 1/2 tablet by mouth daily      . aspirin 325 MG EC tablet Take 325 mg by mouth daily.      Marland Kitchen atorvastatin (LIPITOR) 40 MG  tablet Take 40 mg by mouth every evening.       . carvedilol (COREG) 12.5 MG tablet Take 12.5 mg by mouth 2 (two) times daily with a meal.      . cholecalciferol (VITAMIN D) 1000 UNITS tablet Take 1,000 Units by mouth 2 (two) times daily.      Marland Kitchen dexamethasone (DECADRON) 4 MG tablet Take 2 tablets (8 mg total) by mouth 2 (two) times daily with a meal. Take two times a day starting the day after chemotherapy for 3 days.  30 tablet  3  . fish oil-omega-3 fatty acids 1000 MG capsule Take 1 g by mouth 2 (two) times daily.      Marland Kitchen HYDROcodone-acetaminophen (NORCO/VICODIN) 5-325 MG per tablet Take 1 tablet by mouth every 6 (six) hours as needed for pain.  90 tablet  0  . levothyroxine (SYNTHROID, LEVOTHROID) 75 MCG tablet Take 75 mcg by mouth daily before breakfast.       . lidocaine-prilocaine (EMLA) cream Apply topically as needed. Place a cotton ball size amt to port 45 min prior to use & cover with plastic wrap.  30 g  2  . LORazepam (ATIVAN) 0.5 MG tablet Take 1 tablet (0.5 mg total) by mouth every 6 (six) hours as needed (Nausea or vomiting).  60 tablet  2  . meloxicam (MOBIC) 15 MG tablet Take  1 tablet (15 mg total) by mouth daily.  30 tablet  2  . Multiple Vitamins-Minerals (MULTIVITAMIN PO) Take 1 tablet by mouth every morning.       . NON FORMULARY Take by mouth 2 (two) times daily. Evening primrose      . ondansetron (ZOFRAN) 8 MG tablet Take 1 tablet (8 mg total) by mouth 2 (two) times daily. Take two times a day starting the day after chemo for 3 days. Then take two times a day as needed for nausea or vomiting.  30 tablet  4  . pyridOXINE (VITAMIN B-6) 100 MG tablet Take 100 mg by mouth daily.      Marland Kitchen spironolactone (ALDACTONE) 25 MG tablet Take 25 mg by mouth 2 (two) times daily.       Marland Kitchen zolpidem (AMBIEN) 5 MG tablet Take 1 tablet (5 mg total) by mouth at bedtime as needed for sleep.  30 tablet  2   No current facility-administered medications for this visit.    Allergies:    Allergies    Allergen Reactions  . Latex Hives, Rash and Other (See Comments)    LATEX TAPE "makes almost a raw spot"; tolerates latex gloves; tolerates paper tape  . Prochlorperazine Edisylate Other (See Comments)    Nervous/ flutter/ shakes--compazine    Social History:  The patient  reports that she has been smoking Cigarettes.  She has a 7.5 pack-year smoking history. She has never used smokeless tobacco. She reports that she does not drink alcohol or use illicit drugs.   ROS:  Please see the history of present illness. All other systems reviewed and negative.   OBJECTIVE: VS:  BP 106/68  Pulse 77  Ht 5\' 6"  (1.676 m)  Wt 193 lb (87.544 kg)  BMI 31.17 kg/m2 Well nourished, well developed, in no acute distress, pale, losing her hair due to chemotherapy.  HEENT: normal Neck: JVD flat. Carotid bruit absent  Cardiac:  normal S1, S2; RRR; no murmur.  Lungs:  clear to auscultation bilaterally, no wheezing, rhonchi or rales Abd: soft, nontender, no hepatomegaly Ext: Edema  Absent . Pulses 2+  Skin: warm and dry Neuro:  CNs 2-12 intact, no focal abnormalities noted  EKG:  Normal sinus rhythm, inferior Q waves, lateral Q waves, interventricular conduction delay.    Signed, Darci Needle III, MD 05/04/2013 10:10 AM

## 2013-05-04 NOTE — Patient Instructions (Addendum)
Your physician recommends that you continue on your current medications as directed. Please refer to the Current Medication list given to you today.  Your physician wants you to follow-up in: 1 year You will receive a reminder letter in the mail two months in advance. If you don't receive a letter, please call our office to schedule the follow-up appointment.  Please call  The office if you are having any cardiac [phone]

## 2013-05-06 ENCOUNTER — Telehealth: Payer: Self-pay | Admitting: *Deleted

## 2013-05-06 ENCOUNTER — Other Ambulatory Visit: Payer: Self-pay | Admitting: *Deleted

## 2013-05-06 DIAGNOSIS — D3910 Neoplasm of uncertain behavior of unspecified ovary: Secondary | ICD-10-CM

## 2013-05-06 MED ORDER — HYDROCODONE-ACETAMINOPHEN 5-325 MG PO TABS: 1.0000 | ORAL_TABLET | Freq: Every day | ORAL | Status: DC | PRN

## 2013-05-06 NOTE — Telephone Encounter (Signed)
Pt left message that she would like a refill on her hydrocodone but if it is ok with Dr. Myna Hidalgo she needs a extra dose between 1500 and 1600.  This is ok per Dr. Myna Hidalgo.  Pt made aware that her rx will be ready for pickup by 1200 today.  Pt voiced understanding.

## 2013-05-14 ENCOUNTER — Ambulatory Visit (HOSPITAL_BASED_OUTPATIENT_CLINIC_OR_DEPARTMENT_OTHER): Payer: BC Managed Care – PPO | Admitting: Hematology & Oncology

## 2013-05-14 ENCOUNTER — Other Ambulatory Visit (HOSPITAL_BASED_OUTPATIENT_CLINIC_OR_DEPARTMENT_OTHER): Payer: BC Managed Care – PPO | Admitting: Lab

## 2013-05-14 ENCOUNTER — Ambulatory Visit (HOSPITAL_BASED_OUTPATIENT_CLINIC_OR_DEPARTMENT_OTHER): Payer: BC Managed Care – PPO

## 2013-05-14 VITALS — BP 108/65 | HR 82 | Temp 98.0°F | Resp 14 | Ht 66.0 in | Wt 197.0 lb

## 2013-05-14 DIAGNOSIS — D3910 Neoplasm of uncertain behavior of unspecified ovary: Secondary | ICD-10-CM

## 2013-05-14 DIAGNOSIS — Z5111 Encounter for antineoplastic chemotherapy: Secondary | ICD-10-CM

## 2013-05-14 DIAGNOSIS — D3911 Neoplasm of uncertain behavior of right ovary: Secondary | ICD-10-CM

## 2013-05-14 LAB — CBC WITH DIFFERENTIAL (CANCER CENTER ONLY)
BASO#: 0 10*3/uL (ref 0.0–0.2)
BASO%: 0.2 % (ref 0.0–2.0)
HCT: 29.9 % — ABNORMAL LOW (ref 34.8–46.6)
HGB: 10 g/dL — ABNORMAL LOW (ref 11.6–15.9)
LYMPH%: 24.9 % (ref 14.0–48.0)
MCHC: 33.4 g/dL (ref 32.0–36.0)
MCV: 97 fL (ref 81–101)
MONO#: 0.8 10*3/uL (ref 0.1–0.9)
NEUT%: 65.5 % (ref 39.6–80.0)
RBC: 3.08 10*6/uL — ABNORMAL LOW (ref 3.70–5.32)
RDW: 17.8 % — ABNORMAL HIGH (ref 11.1–15.7)
WBC: 8.5 10*3/uL (ref 3.9–10.0)

## 2013-05-14 LAB — CMP (CANCER CENTER ONLY)
ALT(SGPT): 16 U/L (ref 10–47)
Alkaline Phosphatase: 54 U/L (ref 26–84)
BUN, Bld: 14 mg/dL (ref 7–22)
CO2: 26 mEq/L (ref 18–33)
Calcium: 9.2 mg/dL (ref 8.0–10.3)
Chloride: 103 mEq/L (ref 98–108)
Creat: 1.3 mg/dl — ABNORMAL HIGH (ref 0.6–1.2)
Total Bilirubin: 0.5 mg/dl (ref 0.20–1.60)

## 2013-05-14 MED ORDER — DEXTROSE 5 % IV SOLN
157.5000 mg/m2 | Freq: Once | INTRAVENOUS | Status: AC
  Administered 2013-05-14: 300 mg via INTRAVENOUS
  Filled 2013-05-14: qty 50

## 2013-05-14 MED ORDER — FAMOTIDINE IN NACL 20-0.9 MG/50ML-% IV SOLN
INTRAVENOUS | Status: AC
  Filled 2013-05-14: qty 50

## 2013-05-14 MED ORDER — FAMOTIDINE IN NACL 20-0.9 MG/50ML-% IV SOLN
20.0000 mg | Freq: Once | INTRAVENOUS | Status: AC
  Administered 2013-05-14: 20 mg via INTRAVENOUS

## 2013-05-14 MED ORDER — DEXAMETHASONE SODIUM PHOSPHATE 20 MG/5ML IJ SOLN
INTRAMUSCULAR | Status: AC
  Filled 2013-05-14: qty 5

## 2013-05-14 MED ORDER — SODIUM CHLORIDE 0.9 % IV SOLN: Freq: Once | INTRAVENOUS | Status: DC

## 2013-05-14 MED ORDER — HEPARIN SOD (PORK) LOCK FLUSH 100 UNIT/ML IV SOLN
500.0000 [IU] | Freq: Once | INTRAVENOUS | Status: AC | PRN
Start: 2013-05-14 — End: 2013-05-14
  Administered 2013-05-14: 500 [IU]
  Filled 2013-05-14: qty 5

## 2013-05-14 MED ORDER — SODIUM CHLORIDE 0.9 % IV SOLN
500.0000 mg | Freq: Once | INTRAVENOUS | Status: AC
  Administered 2013-05-14: 500 mg via INTRAVENOUS
  Filled 2013-05-14: qty 50

## 2013-05-14 MED ORDER — PALONOSETRON HCL INJECTION 0.25 MG/5ML
0.2500 mg | Freq: Once | INTRAVENOUS | Status: AC
  Administered 2013-05-14: 0.25 mg via INTRAVENOUS

## 2013-05-14 MED ORDER — PALONOSETRON HCL INJECTION 0.25 MG/5ML
INTRAVENOUS | Status: AC
  Filled 2013-05-14: qty 5

## 2013-05-14 MED ORDER — DIPHENHYDRAMINE HCL 50 MG/ML IJ SOLN
INTRAMUSCULAR | Status: AC
  Filled 2013-05-14: qty 1

## 2013-05-14 MED ORDER — DEXAMETHASONE SODIUM PHOSPHATE 20 MG/5ML IJ SOLN
12.0000 mg | Freq: Once | INTRAMUSCULAR | Status: AC
  Administered 2013-05-14: 12 mg via INTRAVENOUS

## 2013-05-14 MED ORDER — DIPHENHYDRAMINE HCL 50 MG/ML IJ SOLN
50.0000 mg | Freq: Once | INTRAMUSCULAR | Status: AC
  Administered 2013-05-14: 50 mg via INTRAVENOUS

## 2013-05-14 MED ORDER — SODIUM CHLORIDE 0.9 % IV SOLN
150.0000 mg | Freq: Once | INTRAVENOUS | Status: AC
  Administered 2013-05-14: 150 mg via INTRAVENOUS
  Filled 2013-05-14: qty 5

## 2013-05-14 NOTE — Patient Instructions (Addendum)
Complete Blood Count A complete blood count is a group of tests that measure several characteristics of the three types of cells in your blood. The liquid portion of your blood (plasma) is not used in these tests. Irregularities found in results from these tests can indicate different conditions, such as anemia, infections, bleeding problems, and cancers. The blood tests included in a complete blood count can be broken down into the cell types that they examine and what they measure:   White blood cells.  White blood cell count This is a measurement of the number of white blood cells in a standard volume (concentration) in your blood sample.  White blood cell differential This identifies the types of white blood cells and the concentration of each in the sample of your blood. There are five different types of white blood cells. They all help you fight infection but in different ways. The differential also identifies immature white blood cells.  Red blood cells.  Red blood cell count This is a measurement of the concentration of red blood cells in your blood sample.  Hemoglobin This is a measurement of the amount of hemoglobin in the sample of your blood. This measurement indicates your blood's overall oxygen-carrying capacity.  Hematocrit This is a measurement of the percentage of space that the red blood cells take up in your blood sample.  Mean corpuscular volume This is a measurement of the average size of your red blood cells.  Mean corpuscular hemoglobin This is a measurement of the average amount of hemoglobin inside each of your red blood cells.  Mean corpuscular hemoglobin concentration This is a calculation of the average concentration of hemoglobin inside each of your red blood cells in your blood sample.  Red blood cell distribution width This is a measurement of the variation in the size of your red blood cells.  Platelets.  The platelet count This is a measurement of the  concentration of platelets in your blood sample.  Mean platelet volume This is a measurement of the average size of the platelets in your blood sample. RESULTS It is your responsibility to obtain your test results. Ask the laboratory or department performing the test when and how you will get your results. Contact your caregiver to discuss any questions you have about your results. Results outside of normal ranges can be an indication of an illness. Examples of abnormal results and possible causes are listed as follows:   White blood cells.  An abnormally low concentration of white blood cells can be caused by certain infections and by conditions that interfere with white blood cell production that happens in the inner part of your bone (bone marrow).  An abnormally high concentration of white blood cells often indicates infections and conditions that cause inflammation. It can also be an indication of blood-related cancer.  Immature white blood cells can indicate an infection or an abnormal condition affecting your bone marrow.  Red blood cells.  An abnormally low concentration of red blood cells, hemoglobin, or hematocrit is called anemia.  An abnormally high concentration of red blood cells, hemoglobin, or hematocrit is called polycythemia. Abnormally high levels of red blood cells can indicate mild thalassemia. Thalassemia is a type of anemia that is passed down through families (hereditary).  When your mean corpuscular volume is abnormally low, your red blood cells are smaller than normal. This can be caused by thalassemia or iron deficiency anemia. Iron deficiency anemia is a type of anemia that is the result of   a deficiency of a nutrient (deficiency anemia). In this case, the nutrient is iron.  An abnormally high mean corpuscular volume means your red blood cells are larger than normal. This can indicate a deficiency anemia caused by a lack of vitamin B12. An abnormally high mean  corpuscular volume also can be caused by a lot of new red blood cells in your blood. This can happen after you have suddenly lost a lot of blood.  Abnormally low mean corpuscular hemoglobin concentration can indicate conditions in which your hemoglobin is abnormally diluted inside the red cells. Examples of these conditions are iron deficiency anemia and thalassemia.  An abnormally high mean corpuscular hemoglobin concentration can indicate the presence of certain hemolytic anemias. Hemolytic anemia is anemia that results from the abnormal breakdown of your red blood cells.  Red cell distribution width is abnormally increased in certain anemias, when new red blood cells are produced after acute blood loss, and with severe thalassemia.  Platelets  An abnormally low concentration of platelets can be a sign of a bleeding disorder.  An abnormally high concentration of platelets can occur with iron deficiency anemia, inflammatory disorders, and cancers or result from physical stresses, such as exercise or blood loss. However, it may also be a sign of a clotting disorder.  New platelets are larger than old platelets. An abnormally high mean platelet volume occurs with a large increase in the number of new platelets being produced by your bone marrow. This can happen after the loss of a large amount of blood or the destruction of your platelets by antibodies. An abnormally high mean platelet volume also can occur with certain bone marrow cancers. Document Released: 07/13/2004 Document Revised: 12/10/2011 Document Reviewed: 10/23/2011 [facility] Patient Information 2014 [place], [place]. [facility] Discharge Instructions for Patients Receiving Chemotherapy  Today you received chemotherapy agents.  Carboplatin and Taxol.  To help prevent nausea and vomiting after your treatment, we encourage you to take your nausea medication as prescribed. If you develop nausea and vomiting that is not  controlled by your nausea medication, call the clinic. If it is after clinic hours your family physician or the after hours number for the clinic or go to the Emergency Department.   BELOW ARE SYMPTOMS THAT SHOULD BE REPORTED IMMEDIATELY:  *FEVER GREATER THAN 100.5 F  *CHILLS WITH OR WITHOUT FEVER  NAUSEA AND VOMITING THAT IS NOT CONTROLLED WITH YOUR NAUSEA MEDICATION  *UNUSUAL SHORTNESS OF BREATH  *UNUSUAL BRUISING OR BLEEDING  TENDERNESS IN MOUTH AND THROAT WITH OR WITHOUT PRESENCE OF ULCERS  *URINARY PROBLEMS  *BOWEL PROBLEMS  UNUSUAL RASH Items with * indicate a potential emergency and should be followed up as soon as possible.   Please let the nurse know about any problems that you may have experienced. Feel free to call the clinic you have any questions or concerns. The clinic phone number is [phone].   I have been informed and understand all the instructions given to me. I know to contact the clinic, my physician, or go to the Emergency Department if any problems should occur. I do not have any questions at this time, but understand that I may call the clinic during office hours   should I have any questions or need assistance in obtaining follow up care.    __________________________________________  _____________  __________ Signature of Patient or Authorized Representative            Date  Time    __________________________________________ Nurse's Signature   Paclitaxel injection What is this medicine? PACLITAXEL (PAK li TAX el) is a chemotherapy drug. It targets fast dividing cells, like cancer cells, and causes these cells to die. This medicine is used to treat ovarian cancer, breast cancer, and other cancers. This medicine may be used for other purposes; ask your health care provider or pharmacist if you have questions. What should I tell my health care provider before I take this medicine? They need to know if you have any of these  conditions: -blood disorders -irregular heartbeat -infection (especially a virus infection such as chickenpox, cold sores, or herpes) -liver disease -previous or ongoing radiation therapy -an unusual or allergic reaction to paclitaxel, alcohol, polyoxyethylated castor oil, other chemotherapy agents, other medicines, foods, dyes, or preservatives -pregnant or trying to get pregnant -breast-feeding How should I use this medicine? This drug is given as an infusion into a vein. It is administered in a hospital or clinic by a specially trained health care professional. Talk to your pediatrician regarding the use of this medicine in children. Special care may be needed. Overdosage: If you think you have taken too much of this medicine contact a poison control center or emergency room at once. NOTE: This medicine is only for you. Do not share this medicine with others. What if I miss a dose? It is important not to miss your dose. Call your doctor or health care professional if you are unable to keep an appointment. What may interact with this medicine? Do not take this medicine with any of the following medications: -disulfiram -metronidazole This medicine may also interact with the following medications: -cyclosporine -dexamethasone -diazepam -ketoconazole -medicines to increase blood counts like filgrastim, pegfilgrastim, sargramostim -other chemotherapy drugs like cisplatin, doxorubicin, epirubicin, etoposide, teniposide, vincristine -quinidine -testosterone -vaccines -verapamil Talk to your doctor or health care professional before taking any of these medicines: -acetaminophen -aspirin -ibuprofen -ketoprofen -naproxen This list may not describe all possible interactions. Give your health care provider a list of all the medicines, herbs, non-prescription drugs, or dietary supplements you use. Also tell them if you smoke, drink alcohol, or use illegal drugs. Some items may interact  with your medicine. What should I watch for while using this medicine? Your condition will be monitored carefully while you are receiving this medicine. You will need important blood work done while you are taking this medicine. This drug may make you feel generally unwell. This is not uncommon, as chemotherapy can affect healthy cells as well as cancer cells. Report any side effects. Continue your course of treatment even though you feel ill unless your doctor tells you to stop. In some cases, you may be given additional medicines to help with side effects. Follow all directions for their use. Call your doctor or health care professional for advice if you get a fever, chills or sore throat, or other symptoms of a cold or flu. Do not treat yourself. This drug decreases your body's ability to fight infections. Try to avoid being around people who are sick. This medicine may increase your risk to bruise or bleed. Call your doctor or health care professional if you notice any unusual bleeding. Be careful brushing and flossing your teeth or using a toothpick because you may get an infection or bleed more easily. If you have any dental work done, tell your dentist you are receiving this medicine. Avoid taking products that contain aspirin, acetaminophen, ibuprofen, naproxen, or ketoprofen unless instructed by your doctor.  These medicines may hide a fever. Do not become pregnant while taking this medicine. Women should inform their doctor if they wish to become pregnant or think they might be pregnant. There is a potential for serious side effects to an unborn child. Talk to your health care professional or pharmacist for more information. Do not breast-feed an infant while taking this medicine. Men are advised not to father a child while receiving this medicine. What side effects may I notice from receiving this medicine? Side effects that you should report to your doctor or health care professional as soon  as possible: -allergic reactions like skin rash, itching or hives, swelling of the face, lips, or tongue -low blood counts - This drug may decrease the number of white blood cells, red blood cells and platelets. You may be at increased risk for infections and bleeding. -signs of infection - fever or chills, cough, sore throat, pain or difficulty passing urine -signs of decreased platelets or bleeding - bruising, pinpoint red spots on the skin, black, tarry stools, nosebleeds -signs of decreased red blood cells - unusually weak or tired, fainting spells, lightheadedness -breathing problems -chest pain -high or low blood pressure -mouth sores -nausea and vomiting -pain, swelling, redness or irritation at the injection site -pain, tingling, numbness in the hands or feet -slow or irregular heartbeat -swelling of the ankle, feet, hands Side effects that usually do not require medical attention (report to your doctor or health care professional if they continue or are bothersome): -bone pain -complete hair loss including hair on your head, underarms, pubic hair, eyebrows, and eyelashes -changes in the color of fingernails -diarrhea -loosening of the fingernails -loss of appetite -muscle or joint pain -red flush to skin -sweating This list may not describe all possible side effects. Call your doctor for medical advice about side effects. You may report side effects to FDA at 1-800-FDA-1088. Where should I keep my medicine? This drug is given in a hospital or clinic and will not be stored at home. NOTE: This sheet is a summary. It may not cover all possible information. If you have questions about this medicine, talk to your doctor, pharmacist, or health care provider.  2012, [employer]. (05/23/2008 11:54:26 AM)Carboplatin injection What is this medicine? CARBOPLATIN (KAR boe pla tin) is a chemotherapy drug. It targets fast dividing cells, like cancer cells, and causes these cells  to die. This medicine is used to treat ovarian cancer and many other cancers. This medicine may be used for other purposes; ask your health care provider or pharmacist if you have questions. What should I tell my health care provider before I take this medicine? They need to know if you have any of these conditions: -blood disorders -hearing problems -kidney disease -recent or ongoing radiation therapy -an unusual or allergic reaction to carboplatin, cisplatin, other chemotherapy, other medicines, foods, dyes, or preservatives -pregnant or trying to get pregnant -breast-feeding How should I use this medicine? This drug is usually given as an infusion into a vein. It is administered in a hospital or clinic by a specially trained health care professional. Talk to your pediatrician regarding the use of this medicine in children. Special care may be needed. Overdosage: If you think you have taken too much of this medicine contact a poison control center or emergency room at once. NOTE: This medicine is only for you. Do not share this medicine with others. What if I miss a dose? It is important not to miss a dose. Call  your doctor or health care professional if you are unable to keep an appointment. What may interact with this medicine? -medicines for seizures -medicines to increase blood counts like filgrastim, pegfilgrastim, sargramostim -some antibiotics like amikacin, gentamicin, neomycin, streptomycin, tobramycin -vaccines Talk to your doctor or health care professional before taking any of these medicines: -acetaminophen -aspirin -ibuprofen -ketoprofen -naproxen This list may not describe all possible interactions. Give your health care provider a list of all the medicines, herbs, non-prescription drugs, or dietary supplements you use. Also tell them if you smoke, drink alcohol, or use illegal drugs. Some items may interact with your medicine. What should I watch for while using this  medicine? Your condition will be monitored carefully while you are receiving this medicine. You will need important blood work done while you are taking this medicine. This drug may make you feel generally unwell. This is not uncommon, as chemotherapy can affect healthy cells as well as cancer cells. Report any side effects. Continue your course of treatment even though you feel ill unless your doctor tells you to stop. In some cases, you may be given additional medicines to help with side effects. Follow all directions for their use. Call your doctor or health care professional for advice if you get a fever, chills or sore throat, or other symptoms of a cold or flu. Do not treat yourself. This drug decreases your body's ability to fight infections. Try to avoid being around people who are sick. This medicine may increase your risk to bruise or bleed. Call your doctor or health care professional if you notice any unusual bleeding. Be careful brushing and flossing your teeth or using a toothpick because you may get an infection or bleed more easily. If you have any dental work done, tell your dentist you are receiving this medicine. Avoid taking products that contain aspirin, acetaminophen, ibuprofen, naproxen, or ketoprofen unless instructed by your doctor. These medicines may hide a fever. Do not become pregnant while taking this medicine. Women should inform their doctor if they wish to become pregnant or think they might be pregnant. There is a potential for serious side effects to an unborn child. Talk to your health care professional or pharmacist for more information. Do not breast-feed an infant while taking this medicine. What side effects may I notice from receiving this medicine? Side effects that you should report to your doctor or health care professional as soon as possible: -allergic reactions like skin rash, itching or hives, swelling of the face, lips, or tongue -signs of infection -  fever or chills, cough, sore throat, pain or difficulty passing urine -signs of decreased platelets or bleeding - bruising, pinpoint red spots on the skin, black, tarry stools, nosebleeds -signs of decreased red blood cells - unusually weak or tired, fainting spells, lightheadedness -breathing problems -changes in hearing -changes in vision -chest pain -high blood pressure -low blood counts - This drug may decrease the number of white blood cells, red blood cells and platelets. You may be at increased risk for infections and bleeding. -nausea and vomiting -pain, swelling, redness or irritation at the injection site -pain, tingling, numbness in the hands or feet -problems with balance, talking, walking -trouble passing urine or change in the amount of urine Side effects that usually do not require medical attention (report to your doctor or health care professional if they continue or are bothersome): -hair loss -loss of appetite -metallic taste in the mouth or changes in taste This list may not describe  all possible side effects. Call your doctor for medical advice about side effects. You may report side effects to FDA at 1-800-FDA-1088. Where should I keep my medicine? This drug is given in a hospital or clinic and will not be stored at home. NOTE: This sheet is a summary. It may not cover all possible information. If you have questions about this medicine, talk to your doctor, pharmacist, or health care provider.  2012, [employer] Standard. (09/15/2007 2:38:05 PM)

## 2013-05-16 NOTE — Progress Notes (Signed)
This office note has been dictated.

## 2013-05-19 ENCOUNTER — Other Ambulatory Visit: Payer: Self-pay

## 2013-05-19 DIAGNOSIS — I472 Ventricular tachycardia: Secondary | ICD-10-CM

## 2013-05-19 DIAGNOSIS — I428 Other cardiomyopathies: Secondary | ICD-10-CM

## 2013-05-19 MED ORDER — LEVOTHYROXINE SODIUM 75 MCG PO TABS: 75.0000 ug | ORAL_TABLET | Freq: Every day | ORAL | Status: DC

## 2013-05-24 LAB — ANTI MULLERIAN HORMONE: AMH AssessR: <0.03 ng/mL

## 2013-05-30 NOTE — Progress Notes (Signed)
CC:   Tammy R. Collins [place], M.D.  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  CURRENT THERAPY:  The patient is status post 3 cycles of adjuvant carboplatin/Taxol.  INTERIM HISTORY:  [name] comes in for followup.  She has had a tough time with the treatment.  We have tried to make some dosage adjustments. Despite this, she still is having some issues.  She really does not want 6 cycles of treatment.  She does not think she can manage 6 cycles of treatment.  As such, we will try to get her to 5 cycles.  We are doing some tumor markers on her.  We follow her inhibin B level. We also follow her anti-mullerian hormone level.  Both have been normal.  She does have quite a lot of nausea and vomiting.  Again, we will see about changing her antiemetics up a little bit.  She has had no bleeding.  She has had no fevers.  There has been no cough.  She has had no leg swelling.  PHYSICAL EXAMINATION:  General:  This is a well-developed, well- nourished white female in no obvious distress.  Vital Signs: Temperature of 98, pulse 82, respiratory rate 14, blood pressure 108/65, weight is 197 pounds.  Head and Neck:  Normocephalic, atraumatic skull. There are no ocular or oral lesions.  She has no palpable cervical or supraclavicular lymph nodes.  Lungs:  Clear bilaterally.  Cardiac: Regular rate and rhythm with a normal S1, S2.  There are no murmurs, rubs, or bruits.  Abdomen:  Soft.  She has a well-healed laparotomy scar.  There is no fluid wave.  There is no guarding or rebound tenderness.  There is no palpable hepatosplenomegaly.  Extremities:  No clubbing, cyanosis or edema.  Neurologic:  No focal neurological deficits.  LABORATORY STUDIES:  White cell count 8.5, hemoglobin 10, hematocrit 30, platelet count 96,000.  BUN 14, creatinine 1.3.  IMPRESSION:  [name] is a very nice 52 year old white female with recurrent granulosa cell tumor.  She was debulked.  All gross  disease was removed.  We will go ahead with treatment today.  We will again try for 5 cycles. This will be her 4th.  We will get her back in 3 weeks time.  Hopefully, making some adjustments with her antiemetics will help her out.    ______________________________ Josph Macho, M.D. PRE/MEDQ  D:  05/16/2013  T:  05/30/2013  Job:  [id number]

## 2013-06-02 ENCOUNTER — Other Ambulatory Visit: Payer: Self-pay | Admitting: *Deleted

## 2013-06-02 ENCOUNTER — Other Ambulatory Visit: Payer: Self-pay | Admitting: Nurse Practitioner

## 2013-06-02 DIAGNOSIS — I472 Ventricular tachycardia: Secondary | ICD-10-CM

## 2013-06-02 DIAGNOSIS — M169 Osteoarthritis of hip, unspecified: Secondary | ICD-10-CM

## 2013-06-02 DIAGNOSIS — I428 Other cardiomyopathies: Secondary | ICD-10-CM

## 2013-06-02 DIAGNOSIS — D3911 Neoplasm of uncertain behavior of right ovary: Secondary | ICD-10-CM

## 2013-06-02 MED ORDER — ATORVASTATIN CALCIUM 40 MG PO TABS: 40.0000 mg | ORAL_TABLET | Freq: Every evening | ORAL | Status: DC

## 2013-06-02 MED ORDER — HYDROCODONE-ACETAMINOPHEN 5-325 MG PO TABS: 1.0000 | ORAL_TABLET | Freq: Every day | ORAL | Status: DC | PRN

## 2013-06-09 ENCOUNTER — Telehealth: Payer: Self-pay | Admitting: Hematology & Oncology

## 2013-06-09 ENCOUNTER — Ambulatory Visit (HOSPITAL_BASED_OUTPATIENT_CLINIC_OR_DEPARTMENT_OTHER): Payer: BC Managed Care – PPO | Admitting: Hematology & Oncology

## 2013-06-09 ENCOUNTER — Other Ambulatory Visit (HOSPITAL_BASED_OUTPATIENT_CLINIC_OR_DEPARTMENT_OTHER): Payer: BC Managed Care – PPO | Admitting: Lab

## 2013-06-09 ENCOUNTER — Ambulatory Visit: Payer: BC Managed Care – PPO

## 2013-06-09 VITALS — BP 115/53 | HR 82 | Temp 97.8°F | Resp 14 | Ht 66.0 in | Wt 191.0 lb

## 2013-06-09 DIAGNOSIS — D3910 Neoplasm of uncertain behavior of unspecified ovary: Secondary | ICD-10-CM

## 2013-06-09 DIAGNOSIS — D3911 Neoplasm of uncertain behavior of right ovary: Secondary | ICD-10-CM

## 2013-06-09 DIAGNOSIS — C569 Malignant neoplasm of unspecified ovary: Secondary | ICD-10-CM

## 2013-06-09 LAB — CBC WITH DIFFERENTIAL (CANCER CENTER ONLY)
BASO#: 0 10*3/uL (ref 0.0–0.2)
EOS%: 1.6 % (ref 0.0–7.0)
Eosinophils Absolute: 0.2 10*3/uL (ref 0.0–0.5)
HCT: 30.5 % — ABNORMAL LOW (ref 34.8–46.6)
HGB: 10.2 g/dL — ABNORMAL LOW (ref 11.6–15.9)
LYMPH#: 3.4 10*3/uL — ABNORMAL HIGH (ref 0.9–3.3)
MCH: 33.6 pg (ref 26.0–34.0)
MCHC: 33.4 g/dL (ref 32.0–36.0)
MONO#: 0.9 10*3/uL (ref 0.1–0.9)
MONO%: 10.1 % (ref 0.0–13.0)
NEUT%: 51 % (ref 39.6–80.0)
Platelets: 171 10*3/uL (ref 145–400)
RBC: 3.04 10*6/uL — ABNORMAL LOW (ref 3.70–5.32)

## 2013-06-09 NOTE — Progress Notes (Signed)
This office note has been dictated.

## 2013-06-09 NOTE — Telephone Encounter (Signed)
Per Dr. Myna Hidalgo it's ok for pt to go 10 weeks between scheduled flush appointments

## 2013-06-11 NOTE — Progress Notes (Signed)
CC:   [name] [place], M.D.  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  CURRENT THERAPY:  The patient is status post 4 cycles of adjuvant carboplatin/Taxol-patient has declined any further therapy.  INTERIM HISTORY:  [name] comes in for followup.  She had a real hard time with her last cycle of chemotherapy.  We even cut her dose back of chemotherapy a little bit.  Even with this, she really had a tough time.  She does have a lot of weakness.  She had a lot of nausea.  There was some vomiting.  She just felt very tired.  We had to follow her tumor markers.  Her inhibin B level was 12.  Her anti-mullerian hormone assay was less than 0.03.  She has had no diarrhea.  She has had no leg swelling.  There has been no rashes.  PHYSICAL EXAMINATION:  General:  This is a well-developed, well- nourished white female, in no obvious distress.  Vital Signs: Temperature of 97.8, pulse 82, respiratory rate 14, blood pressure 115/53, weight is 191 pounds.  Head and Neck:  Normocephalic, atraumatic skull.  There are no ocular or oral lesions.  There are no palpable cervical or supraclavicular lymph nodes.  Lungs:  Clear bilaterally. Cardiac:  Regular rate and rhythm with a normal S1 and S2.  There are no murmurs, rubs, or bruits.  Abdomen:  Soft.  She has good bowel sounds. There is no palpable abdominal mass.  There is no palpable hepatosplenomegaly.  She has well-healed laparotomy scar.  She has a little bit of a keloid.  Back:  No tenderness over the spine, ribs, or hips.  Extremities:  No clubbing, cyanosis, or edema.  Neurological:  No focal neurological deficits.  Skin:  No rashes, ecchymosis or petechia.  LABORATORY STUDIES:  White cell count is 9.2, hemoglobin 10.2, hematocrit 38.5, platelet count 171.  IMPRESSION:  [name] is a very charming 52 year old white female.  She has history of recurrent granulosa cell tumor of the ovary.  We went ahead and had her do both.   This was done by Dr. Chestine Spore.  He did a great job with this.  We did give her a "adjuvant" chemotherapy with carboplatin/Taxol.  She really had a tough time with treatment.  Even with dose reductions, she had a hard time.  We will go ahead and stop her treatments now.  We will go ahead and plan for a followup CT scan to follow her along. We probably need to follow her scans every 3-4 months for the first year.  We will also follow up her tumor levels, the inhibin B and anti- mullerian hormone assays.  I will plan to see her back myself in probably about 6 weeks.    ______________________________ Josph Macho, M.D. PRE/MEDQ  D:  06/09/2013  T:  06/10/2013  Job:  [id number]

## 2013-06-14 ENCOUNTER — Ambulatory Visit (INDEPENDENT_AMBULATORY_CARE_PROVIDER_SITE_OTHER): Payer: BC Managed Care – PPO | Admitting: *Deleted

## 2013-06-14 ENCOUNTER — Encounter: Payer: Self-pay | Admitting: Internal Medicine

## 2013-06-14 DIAGNOSIS — I472 Ventricular tachycardia: Secondary | ICD-10-CM

## 2013-06-15 LAB — COMPREHENSIVE METABOLIC PANEL
AST: 17 U/L (ref 0–37)
Albumin: 3.8 g/dL (ref 3.5–5.2)
BUN: 16 mg/dL (ref 6–23)
Calcium: 10 mg/dL (ref 8.4–10.5)
Chloride: 107 mEq/L (ref 96–112)
Creatinine, Ser: 1.28 mg/dL — ABNORMAL HIGH (ref 0.50–1.10)
Glucose, Bld: 81 mg/dL (ref 70–99)

## 2013-06-15 LAB — INHIBIN B: Inhibin B: 39 pg/mL

## 2013-06-15 LAB — LACTATE DEHYDROGENASE: LDH: 186 U/L (ref 94–250)

## 2013-06-19 LAB — MDC_IDC_ENUM_SESS_TYPE_REMOTE
Battery Remaining Longevity: 53 mo
Battery Voltage: 2.96 V
Brady Statistic AP VP Percent: 0 %
Brady Statistic AP VS Percent: 1 %
Brady Statistic AS VP Percent: 1 %
Brady Statistic AS VS Percent: 99 %
HighPow Impedance: 55 Ohm
HighPow Impedance: 55 Ohm
Implantable Pulse Generator Serial Number: 778164
Lead Channel Impedance Value: 1225 Ohm
Lead Channel Pacing Threshold Pulse Width: 1.5 ms
Lead Channel Sensing Intrinsic Amplitude: 3.1 mV
Lead Channel Setting Pacing Amplitude: 2 V
Lead Channel Setting Pacing Pulse Width: 1.3 ms
Zone Setting Detection Interval: 260 ms
Zone Setting Detection Interval: 315 ms
Zone Setting Detection Interval: 400 ms

## 2013-06-23 ENCOUNTER — Other Ambulatory Visit: Payer: Self-pay

## 2013-06-23 MED ORDER — AMIODARONE HCL 200 MG PO TABS: ORAL_TABLET | ORAL | Status: DC

## 2013-06-30 ENCOUNTER — Ambulatory Visit: Payer: BC Managed Care – PPO | Admitting: Hematology & Oncology

## 2013-06-30 ENCOUNTER — Other Ambulatory Visit: Payer: BC Managed Care – PPO | Admitting: Lab

## 2013-06-30 ENCOUNTER — Ambulatory Visit: Payer: BC Managed Care – PPO

## 2013-07-05 ENCOUNTER — Ambulatory Visit (HOSPITAL_BASED_OUTPATIENT_CLINIC_OR_DEPARTMENT_OTHER)
Admission: RE | Admit: 2013-07-05 | Discharge: 2013-07-05 | Disposition: A | Discharge status: Home / Self Care | Payer: BC Managed Care – PPO | Source: Ambulatory Visit | Attending: Hematology & Oncology | Admitting: Hematology & Oncology

## 2013-07-05 ENCOUNTER — Ambulatory Visit (HOSPITAL_BASED_OUTPATIENT_CLINIC_OR_DEPARTMENT_OTHER): Payer: BC Managed Care – PPO

## 2013-07-05 ENCOUNTER — Encounter (HOSPITAL_BASED_OUTPATIENT_CLINIC_OR_DEPARTMENT_OTHER): Payer: Self-pay

## 2013-07-05 VITALS — BP 101/71 | HR 83 | Temp 97.1°F | Resp 16

## 2013-07-05 DIAGNOSIS — R911 Solitary pulmonary nodule: Secondary | ICD-10-CM | POA: Insufficient documentation

## 2013-07-05 DIAGNOSIS — D3910 Neoplasm of uncertain behavior of unspecified ovary: Secondary | ICD-10-CM

## 2013-07-05 DIAGNOSIS — Z950 Presence of cardiac pacemaker: Secondary | ICD-10-CM | POA: Insufficient documentation

## 2013-07-05 DIAGNOSIS — K449 Diaphragmatic hernia without obstruction or gangrene: Secondary | ICD-10-CM | POA: Insufficient documentation

## 2013-07-05 DIAGNOSIS — Z96649 Presence of unspecified artificial hip joint: Secondary | ICD-10-CM | POA: Insufficient documentation

## 2013-07-05 DIAGNOSIS — Z8543 Personal history of malignant neoplasm of ovary: Secondary | ICD-10-CM | POA: Insufficient documentation

## 2013-07-05 DIAGNOSIS — Z452 Encounter for adjustment and management of vascular access device: Secondary | ICD-10-CM

## 2013-07-05 DIAGNOSIS — I517 Cardiomegaly: Secondary | ICD-10-CM | POA: Insufficient documentation

## 2013-07-05 DIAGNOSIS — K7689 Other specified diseases of liver: Secondary | ICD-10-CM | POA: Insufficient documentation

## 2013-07-05 DIAGNOSIS — R599 Enlarged lymph nodes, unspecified: Secondary | ICD-10-CM | POA: Insufficient documentation

## 2013-07-05 DIAGNOSIS — Z90710 Acquired absence of both cervix and uterus: Secondary | ICD-10-CM | POA: Insufficient documentation

## 2013-07-05 MED ORDER — IOHEXOL 300 MG/ML  SOLN
100.0000 mL | Freq: Once | INTRAMUSCULAR | Status: AC | PRN
  Administered 2013-07-05: 100 mL via INTRAVENOUS

## 2013-07-05 MED ORDER — HEPARIN SOD (PORK) LOCK FLUSH 100 UNIT/ML IV SOLN
500.0000 [IU] | Freq: Once | INTRAVENOUS | Status: AC
  Administered 2013-07-05: 500 [IU] via INTRAVENOUS
  Filled 2013-07-05: qty 5

## 2013-07-05 MED ORDER — SODIUM CHLORIDE 0.9 % IJ SOLN
10.0000 mL | INTRAMUSCULAR | Status: DC | PRN
  Administered 2013-07-05: 10 mL via INTRAVENOUS
  Filled 2013-07-05: qty 10

## 2013-07-05 NOTE — Patient Instructions (Signed)

## 2013-07-07 ENCOUNTER — Other Ambulatory Visit: Payer: Self-pay | Admitting: Hematology & Oncology

## 2013-07-07 ENCOUNTER — Encounter: Payer: Self-pay | Admitting: *Deleted

## 2013-07-07 DIAGNOSIS — D3910 Neoplasm of uncertain behavior of unspecified ovary: Secondary | ICD-10-CM

## 2013-07-08 ENCOUNTER — Telehealth: Payer: Self-pay | Admitting: Hematology & Oncology

## 2013-07-08 NOTE — Telephone Encounter (Signed)
Per note on order biopsy is under review in scheduling

## 2013-07-09 ENCOUNTER — Other Ambulatory Visit: Payer: Self-pay | Admitting: Hematology & Oncology

## 2013-07-09 ENCOUNTER — Telehealth: Payer: Self-pay | Admitting: Hematology & Oncology

## 2013-07-09 ENCOUNTER — Encounter: Payer: Self-pay | Admitting: Gynecology

## 2013-07-09 ENCOUNTER — Ambulatory Visit: Payer: BC Managed Care – PPO | Attending: Gynecology | Admitting: Gynecology

## 2013-07-09 ENCOUNTER — Other Ambulatory Visit: Payer: Self-pay | Admitting: Radiology

## 2013-07-09 VITALS — BP 122/59 | HR 78 | Temp 98.9°F | Resp 16 | Ht 66.0 in | Wt 190.1 lb

## 2013-07-09 DIAGNOSIS — R229 Localized swelling, mass and lump, unspecified: Secondary | ICD-10-CM | POA: Insufficient documentation

## 2013-07-09 DIAGNOSIS — Z954 Presence of other heart-valve replacement: Secondary | ICD-10-CM | POA: Insufficient documentation

## 2013-07-09 DIAGNOSIS — F17200 Nicotine dependence, unspecified, uncomplicated: Secondary | ICD-10-CM | POA: Insufficient documentation

## 2013-07-09 DIAGNOSIS — Z9221 Personal history of antineoplastic chemotherapy: Secondary | ICD-10-CM | POA: Insufficient documentation

## 2013-07-09 DIAGNOSIS — I5022 Chronic systolic (congestive) heart failure: Secondary | ICD-10-CM | POA: Insufficient documentation

## 2013-07-09 DIAGNOSIS — E7800 Pure hypercholesterolemia, unspecified: Secondary | ICD-10-CM | POA: Insufficient documentation

## 2013-07-09 DIAGNOSIS — I2510 Atherosclerotic heart disease of native coronary artery without angina pectoris: Secondary | ICD-10-CM | POA: Insufficient documentation

## 2013-07-09 DIAGNOSIS — D3910 Neoplasm of uncertain behavior of unspecified ovary: Secondary | ICD-10-CM

## 2013-07-09 DIAGNOSIS — Z8551 Personal history of malignant neoplasm of bladder: Secondary | ICD-10-CM | POA: Insufficient documentation

## 2013-07-09 DIAGNOSIS — G609 Hereditary and idiopathic neuropathy, unspecified: Secondary | ICD-10-CM | POA: Insufficient documentation

## 2013-07-09 DIAGNOSIS — Z95810 Presence of automatic (implantable) cardiac defibrillator: Secondary | ICD-10-CM | POA: Insufficient documentation

## 2013-07-09 DIAGNOSIS — I252 Old myocardial infarction: Secondary | ICD-10-CM | POA: Insufficient documentation

## 2013-07-09 DIAGNOSIS — Z96649 Presence of unspecified artificial hip joint: Secondary | ICD-10-CM | POA: Insufficient documentation

## 2013-07-09 DIAGNOSIS — C569 Malignant neoplasm of unspecified ovary: Secondary | ICD-10-CM

## 2013-07-09 DIAGNOSIS — E039 Hypothyroidism, unspecified: Secondary | ICD-10-CM | POA: Insufficient documentation

## 2013-07-09 DIAGNOSIS — Z79899 Other long term (current) drug therapy: Secondary | ICD-10-CM | POA: Insufficient documentation

## 2013-07-09 NOTE — Telephone Encounter (Signed)
Dr. Marin Olp aware he needs to change biopsy order.

## 2013-07-09 NOTE — Progress Notes (Signed)
Consult Note: Gyn-Onc   LABERTA EGELAND 53 y.o. female  Chief Complaint  Patient presents with  . 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 Status post 4 cycles of carboplatin Taxol chemotherapy.  Plan:    Fine-needle aspiration of the abdominal wall mass is scheduled the performed next week. If this confirms recurrent disease, I would recommend surgical excision. We will hold operating time on February 3. It may be that needle localization or  A tattoo would be helpful since his masses not palpable.    Interval history: The patient returns today for continuing followup as previously scheduled. She is now received 4 cycles of carboplatin and Taxol. Chemotherapy was discontinued after 4 cycles because the patient was not tolerating it. Noted that her inhibin B. in mid-December had risen from 12-39. Her AMH is been normal. A repeat CT scan  Shows a subcutaneous mass in the lower abdomen just to the right of midline incision. Review of her prior CT in May shows a similar mass although the mass now slightly smaller.  Patient seizure recovered from chemotherapy well and has returned to work part-time basis. She has minimal peripheral neuropathy. She denies any GI or GU symptoms.    HPI: 53 year old white female seen in consultation request of [name] regarding management of recurrent granulosa cell tumor. Patient first underwent surgery in 2007 vitamin stage I C. granulosa cell tumor. She received 3 cycles of bleomycin, etoposide, and cisplatin chemotherapy as an adjunct.  Recently the patient developed a lower nominal pain and noticed a "knot" in the region of her prior Pfannenstiel incision. A CT scan was obtained showing a large mass in the anterior abdominal wall as well as 2 other masses in the pelvis. The anterior mass measures 9.5 x 10 x 11.5 cm with cystic and solid components. In addition there 2 masses in the pelvis measuring  5.3 x 4.3 and 5.5 x 5.0 cm. A fine-needle biopsy of the mass is then obtained confirming recurrent granulosa cell tumor. The mass seems to extend into the anterior abdominal wall in the pelvis.  She underwent radical debulking of her recurrent granulosa cell tumor on 01/20/2013. All disease was resected. This required a low rectal anastomosis and resection of distal ureter with reimplantation.  Postoperatively, adjuvant therapy was recommended using carboplatin and Taxol. She received 4 cycles but did not tolerated very well and therefore discontinued. He CT scan in January 2015 showed a subcutaneous nodule in the lower abdomen and the inhibin Bl is a 39 units per mL.   Review of Systems:10 point review of systems is negative except as noted in interval history.   Vitals: Blood pressure 122/59, pulse 78, temperature 98.9 F (37.2 C), resp. rate 16, height 5\' 6"  (1.676 m), weight 190 lb 1.6 oz (86.229 kg), SpO2 99.00%.  Physical Exam: General : The patient is a healthy woman in no acute distress.  HEENT: normocephalic, extraoccular movements normal; neck is supple without thyromegally  Lynphnodes: Supraclavicular and inguinal nodes not enlarged  Abdomen: Soft, non-tender, no ascites, no organomegally, midline incision is well-healed. On careful palpation am unable to locate the mass that was identified on CT scan.  Pelvic:  EGBUS: Normal  Vagina: Normal no lesions are noted  Cervix and uterus are surgically absent  Bimanual and rectovaginal exam revealed no masses induration or nodularity.   Lower extremities: No edema or varicosities. Normal range of motion  Allergies  Allergen Reactions  . Latex Hives, Rash and Other (See Comments)    LATEX TAPE "makes almost a raw spot"; tolerates latex gloves; tolerates paper tape  . Prochlorperazine Edisylate Other (See Comments)    Nervous/ flutter/ shakes--compazine    Past Medical History  Diagnosis Date  . Arrhythmia      ventricular tachycardia  . Unspecified systolic heart failure   . Ventricular fibrillation   . Coronary artery disease   . ICD (implantable cardiac defibrillator) in place   . Pacemaker   . High cholesterol   . CHF (congestive heart failure)   . Myocardial infarction 2011  . Hypothyroidism   . Arthritis 02/06/2012    "everywhere"  . Bladder cancer 2009    "injected medicine to get rid of it"  . Granulosa cell carcinoma of ovary 2007    right; "had chemo"  . Pneumonia     as child    Past Surgical History  Procedure Laterality Date  . Cardiac defibrillator placement  02/06/2012    "lead change"  . Insert / replace / remove pacemaker  10/2009    [place]  . Tonsillectomy and adenoidectomy  ~ 1967  . Appendectomy  1989  . Vaginal hysterectomy  2001  . Tubal ligation  1993  . Bilateral oophorectomy  2007  . Mitral valve replacement  2011    "pig valve"  . Cardiac valve replacement    . Transurethral resection of bladder  2009    "for bladder cancer"  . Thymus removed    . Total hip arthroplasty  05/06/2012    Procedure: TOTAL HIP ARTHROPLASTY;  Surgeon: Gearlean Alf, MD;  Location: [facility];  Service: Orthopedics;  Laterality: Right;  . Laparotomy N/A 01/19/2013    Procedure: TUMOR DEBULKING / BOWEL RESECTION/INSERTION RIGHT UTERERAL DOUBLE J STENT/OMENTECTOMY;  Surgeon: Alvino Chapel, MD;  Location: [facility];  Service: Gynecology;  Laterality: N/A;  . Cystoscopy Right 02/23/2013    Procedure: CYSTOSCOPY WITH STENT REMOVAL;  Surgeon: Alvino Chapel, MD;  Location: [facility];  Service: Gynecology;  Laterality: Right;    Current Outpatient Prescriptions  Medication Sig Dispense Refill  . amiodarone (PACERONE) 200 MG tablet 1/2 tablet by mouth daily  30 tablet  6  . aspirin 325 MG EC tablet Take 325 mg by mouth daily.      Marland Kitchen atorvastatin (LIPITOR) 40 MG tablet Take 1 tablet (40 mg total) by mouth every evening.  30 tablet  5  . carvedilol  (COREG) 12.5 MG tablet Take 1 tablet (12.5 mg total) by mouth 2 (two) times daily with a meal.  60 tablet  11  . cholecalciferol (VITAMIN D) 1000 UNITS tablet Take 1,000 Units by mouth 2 (two) times daily.      . fish oil-omega-3 fatty acids 1000 MG capsule Take 1 g by mouth 2 (two) times daily.      Marland Kitchen HYDROcodone-acetaminophen (NORCO/VICODIN) 5-325 MG per tablet Take 1 tablet by mouth 5 (five) times daily as needed.  120 tablet  0  . levothyroxine (SYNTHROID, LEVOTHROID) 75 MCG tablet Take 1 tablet (75 mcg total) by mouth daily before breakfast.  30 tablet  6  . lidocaine-prilocaine (EMLA) cream Apply topically as needed. Place a cotton ball size amt to port 45 min prior to use & cover with plastic wrap.  30 g  2  . meloxicam (MOBIC) 15 MG tablet Take 1 tablet (15 mg total) by mouth daily.  30 tablet  2  . Multiple Vitamins-Minerals (MULTIVITAMIN PO) Take 1 tablet by mouth every morning.       . pyridOXINE (VITAMIN B-6) 100 MG tablet Take 100 mg by mouth daily.      Marland Kitchen spironolactone (ALDACTONE) 25 MG tablet Take 1 tablet (25 mg total) by mouth 2 (two) times daily.  60 tablet  11  . zolpidem (AMBIEN) 5 MG tablet Take 1 tablet (5 mg total) by mouth at bedtime as needed for sleep.  30 tablet  2   No current facility-administered medications for this visit.    History   Social History  . Marital Status: Divorced    Spouse Name: N/A    Number of Children: N/A  . Years of Education: N/A   Occupational History  .      WORKS FULL TIME   Social History Main Topics  . Smoking status: Current Some Day Smoker -- 0.25 packs/day for 30 years    Types: Cigarettes  . Smokeless tobacco: Never Used  . Alcohol Use: No  . Drug Use: No  . Sexual Activity: Not Currently   Other Topics Concern  . Not on file   Social History Narrative   WORKS FULL TIME   SINGLE   TOBACCO USE-YES   IMPLANTATION OF DUAL-CHAMBER [employer] DEFIBRILLATOR    No family history on file.    Alvino Chapel,  MD 07/09/2013, 12:20 PM

## 2013-07-09 NOTE — Patient Instructions (Signed)
We will contact you to arrange future surgery if needed.

## 2013-07-12 ENCOUNTER — Other Ambulatory Visit: Payer: Self-pay | Admitting: Hematology & Oncology

## 2013-07-13 ENCOUNTER — Encounter: Payer: Self-pay | Admitting: Gynecologic Oncology

## 2013-07-13 ENCOUNTER — Other Ambulatory Visit (HOSPITAL_COMMUNITY): Payer: Self-pay | Admitting: Radiology

## 2013-07-14 ENCOUNTER — Encounter (HOSPITAL_COMMUNITY): Payer: Self-pay | Admitting: Pharmacy Technician

## 2013-07-15 ENCOUNTER — Other Ambulatory Visit: Payer: Self-pay | Admitting: *Deleted

## 2013-07-15 MED ORDER — HYDROCODONE-ACETAMINOPHEN 5-325 MG PO TABS: 1.0000 | ORAL_TABLET | Freq: Three times a day (TID) | ORAL | Status: DC | PRN

## 2013-07-16 ENCOUNTER — Other Ambulatory Visit (HOSPITAL_COMMUNITY): Payer: BC Managed Care – PPO

## 2013-07-16 ENCOUNTER — Ambulatory Visit (HOSPITAL_COMMUNITY): Payer: BC Managed Care – PPO

## 2013-07-16 ENCOUNTER — Ambulatory Visit (HOSPITAL_COMMUNITY)
Admission: RE | Admit: 2013-07-16 | Discharge: 2013-07-16 | Disposition: A | Discharge status: Home / Self Care | Payer: BC Managed Care – PPO | Source: Ambulatory Visit | Attending: Hematology & Oncology | Admitting: Hematology & Oncology

## 2013-07-16 DIAGNOSIS — C569 Malignant neoplasm of unspecified ovary: Secondary | ICD-10-CM | POA: Insufficient documentation

## 2013-07-16 DIAGNOSIS — D3910 Neoplasm of uncertain behavior of unspecified ovary: Secondary | ICD-10-CM

## 2013-07-16 LAB — CBC
HCT: 33.2 % — ABNORMAL LOW (ref 36.0–46.0)
HEMOGLOBIN: 11.4 g/dL — AB (ref 12.0–15.0)
MCH: 34.2 pg — AB (ref 26.0–34.0)
MCHC: 34.3 g/dL (ref 30.0–36.0)
MCV: 99.7 fL (ref 78.0–100.0)
PLATELETS: 183 10*3/uL (ref 150–400)
RBC: 3.33 MIL/uL — ABNORMAL LOW (ref 3.87–5.11)
RDW: 13 % (ref 11.5–15.5)
WBC: 9.3 10*3/uL (ref 4.0–10.5)

## 2013-07-16 LAB — PROTIME-INR
INR: 0.96 (ref 0.00–1.49)
PROTHROMBIN TIME: 12.6 s (ref 11.6–15.2)

## 2013-07-16 LAB — APTT: APTT: 30 s (ref 24–37)

## 2013-07-16 MED ORDER — FENTANYL CITRATE 0.05 MG/ML IJ SOLN
INTRAMUSCULAR | Status: AC | PRN
  Administered 2013-07-16: 12.5 ug via INTRAVENOUS
  Administered 2013-07-16: 25 ug via INTRAVENOUS

## 2013-07-16 MED ORDER — FENTANYL CITRATE 0.05 MG/ML IJ SOLN
INTRAMUSCULAR | Status: AC
  Filled 2013-07-16: qty 2

## 2013-07-16 MED ORDER — MIDAZOLAM HCL 2 MG/2ML IJ SOLN
INTRAMUSCULAR | Status: AC
  Filled 2013-07-16: qty 2

## 2013-07-16 MED ORDER — MIDAZOLAM HCL 2 MG/2ML IJ SOLN
INTRAMUSCULAR | Status: AC | PRN
  Administered 2013-07-16: 2 mg via INTRAVENOUS

## 2013-07-16 MED ORDER — SODIUM CHLORIDE 0.9 % IV SOLN
INTRAVENOUS | Status: DC
  Administered 2013-07-16: 10:00:00 via INTRAVENOUS

## 2013-07-16 NOTE — Discharge Instructions (Signed)
Needle Biopsy °Care After °These instructions give you information on caring for yourself after your procedure. Your doctor may also give you more specific instructions. Call your doctor if you have any problems or questions after your procedure. °HOME CARE °· Rest for 4 hours after your biopsy, except for getting up to go to the bathroom or as told. °· Keep the places where the needles were put in clean and dry. °· Do not put powder or lotion on the sites. °· Do not shower until 24 hours after the test. Remove all bandages (dressings) before showering. °· Remove all bandages at least once every day. Gently clean the sites with soap and water. Keep putting a new bandage on until the skin is closed. °Finding out the results of your test °Ask your doctor when your test results will be ready. Make sure you follow up and get the test results. °GET HELP RIGHT AWAY IF:  °· You have shortness of breath or trouble breathing. °· You have pain or cramping in your belly (abdomen). °· You feel sick to your stomach (nauseous) or throw up (vomit). °· Any of the places where the needles were put in: °· Are puffy (swollen) or red. °· Are sore or hot to the touch. °· Are draining yellowish-white fluid (pus). °· Are bleeding after 10 minutes of pressing down on the site. Have someone keep pressing on any place that is bleeding until you see a doctor. °· You have any unusual pain that will not stop. °· You have a fever. °If you go to the emergency room, tell the nurse that you had a biopsy. Take this paper with you to show the nurse. °MAKE SURE YOU:  °· Understand these instructions. °· Will watch your condition. °· Will get help right away if you are not doing well or get worse. °Document Released: 05/23/2008 Document Revised: 09/02/2011 Document Reviewed: 05/23/2008 °[employer]® Patient Information ©2014 [employer]. ° ° °

## 2013-07-16 NOTE — H&P (Signed)
[name] is an 53 y.o. female.   Chief Complaint: pt with Ovarian Ca 2007 Surgery for pelvic mass - recurrent granulosa cell tumor-performed 12/2012 Follow up CT reveals low anterior abdominal wall mass/lymph node Possible metastasis Scheduled for biopsy of same today  HPI: CAD/MI; ICD; pacemaker; HLD; CHF; Bladder Ca; Ov CA  Past Medical History  Diagnosis Date  . Arrhythmia     ventricular tachycardia  . Unspecified systolic heart failure   . Ventricular fibrillation   . Coronary artery disease   . ICD (implantable cardiac defibrillator) in place   . Pacemaker   . High cholesterol   . CHF (congestive heart failure)   . Myocardial infarction 2011  . Hypothyroidism   . Arthritis 02/06/2012    "everywhere"  . Bladder cancer 2009    "injected medicine to get rid of it"  . Granulosa cell carcinoma of ovary 2007    right; "had chemo"  . Pneumonia     as child    Past Surgical History  Procedure Laterality Date  . Cardiac defibrillator placement  02/06/2012    "lead change"  . Insert / replace / remove pacemaker  10/2009    [place]  . Tonsillectomy and adenoidectomy  ~ 1967  . Appendectomy  1989  . Vaginal hysterectomy  2001  . Tubal ligation  1993  . Bilateral oophorectomy  2007  . Mitral valve replacement  2011    "pig valve"  . Cardiac valve replacement    . Transurethral resection of bladder  2009    "for bladder cancer"  . Thymus removed    . Total hip arthroplasty  05/06/2012    Procedure: TOTAL HIP ARTHROPLASTY;  Surgeon: Gearlean Alf, MD;  Location: [facility];  Service: Orthopedics;  Laterality: Right;  . Laparotomy N/A 01/19/2013    Procedure: TUMOR DEBULKING / BOWEL RESECTION/INSERTION RIGHT UTERERAL DOUBLE J STENT/OMENTECTOMY;  Surgeon: Alvino Chapel, MD;  Location: [facility];  Service: Gynecology;  Laterality: N/A;  . Cystoscopy Right 02/23/2013    Procedure: CYSTOSCOPY WITH STENT REMOVAL;  Surgeon: Alvino Chapel, MD;  Location: [facility];  Service: Gynecology;  Laterality: Right;    No family history on file. Social History:  reports that she has been smoking Cigarettes.  She has a 7.5 pack-year smoking history. She has never used smokeless tobacco. She reports that she does not drink alcohol or use illicit drugs.  Allergies:  Allergies  Allergen Reactions  . Latex Hives, Rash and Other (See Comments)    LATEX TAPE "makes almost a raw spot"; tolerates latex gloves; tolerates paper tape  . Prochlorperazine Edisylate Other (See Comments)    Nervous/ flutter/ shakes--compazine     (Not in a hospital admission)  No results found for this or any previous visit (from the past 48 hour(s)). No results found.  Review of Systems  Constitutional: Negative for fever and weight loss.  Respiratory: Negative for shortness of breath.   Cardiovascular: Negative for chest pain.  Gastrointestinal: Negative for nausea, vomiting and abdominal pain.  Neurological: Positive for weakness. Negative for headaches.  Psychiatric/Behavioral: Negative for substance abuse.    Blood pressure 140/66, pulse 80, temperature 98.4 F (36.9 C), temperature source Oral, resp. rate 18, height 5\' 6"  (1.676 m), weight 188 lb (85.276 kg), SpO2 100.00%. Physical Exam  Constitutional: She is oriented to person, place, and time. She appears well-developed.  Cardiovascular: Normal rate and regular rhythm.   No murmur heard. Respiratory: Effort normal  and breath sounds normal. She has no wheezes.  GI: Soft. Bowel sounds are normal. There is no tenderness.  Musculoskeletal: Normal range of motion.  Neurological: She is alert and oriented to person, place, and time.  Skin: Skin is warm and dry.  Psychiatric: She has a normal mood and affect. Her behavior is normal. Judgment and thought content normal.     Assessment/Plan Pt with Hx Ovarian Ca 2007 Recurrence and pelvic mass removal 2014 Follow up CT reveals small  LAN/mass ant abd wall Scheduled for bx Pt aware of procedure benefits and risks and agreeable to proceed Consent signed and in chart  Epsie Walthall A 07/16/2013, 9:57 AM

## 2013-07-16 NOTE — Procedures (Signed)
Technically successful US guided biopsy of indeterminate LN within the subcutaneous tissue of the right side of the lower abdomen.  No immediate complications.

## 2013-07-20 ENCOUNTER — Telehealth: Payer: Self-pay | Admitting: Hematology & Oncology

## 2013-07-20 NOTE — Telephone Encounter (Signed)
Patient called and stated she spoke with Dr.Ennever and they agreed to cx lab/md apt fore 07/21/13 but to keep Flush apt.  So patient will be coming in for flush apt only

## 2013-07-21 ENCOUNTER — Ambulatory Visit: Payer: BC Managed Care – PPO | Admitting: Lab

## 2013-07-21 ENCOUNTER — Ambulatory Visit: Payer: BC Managed Care – PPO | Admitting: Hematology & Oncology

## 2013-07-22 ENCOUNTER — Encounter (HOSPITAL_COMMUNITY): Payer: Self-pay | Admitting: Pharmacy Technician

## 2013-07-23 ENCOUNTER — Encounter (HOSPITAL_COMMUNITY)
Admission: RE | Admit: 2013-07-23 | Discharge: 2013-07-23 | Disposition: A | Discharge status: Home / Self Care | Payer: BC Managed Care – PPO | Source: Ambulatory Visit | Attending: Gynecology | Admitting: Gynecology

## 2013-07-23 ENCOUNTER — Encounter (HOSPITAL_COMMUNITY): Payer: Self-pay

## 2013-07-23 DIAGNOSIS — Z01812 Encounter for preprocedural laboratory examination: Secondary | ICD-10-CM | POA: Insufficient documentation

## 2013-07-23 HISTORY — DX: Presence of automatic (implantable) cardiac defibrillator: Z95.810

## 2013-07-23 HISTORY — DX: Pain, unspecified: R52

## 2013-07-23 HISTORY — DX: Polyneuropathy, unspecified: G62.9

## 2013-07-23 HISTORY — DX: Presence of other vascular implants and grafts: Z95.828

## 2013-07-23 LAB — COMPREHENSIVE METABOLIC PANEL
ALK PHOS: 62 U/L (ref 39–117)
ALT: 17 U/L (ref 0–35)
AST: 18 U/L (ref 0–37)
Albumin: 3.8 g/dL (ref 3.5–5.2)
BUN: 16 mg/dL (ref 6–23)
CHLORIDE: 101 meq/L (ref 96–112)
CO2: 23 mEq/L (ref 19–32)
Calcium: 10 mg/dL (ref 8.4–10.5)
Creatinine, Ser: 1.39 mg/dL — ABNORMAL HIGH (ref 0.50–1.10)
GFR calc Af Amer: 49 mL/min — ABNORMAL LOW (ref >90)
GFR calc non Af Amer: 42 mL/min — ABNORMAL LOW (ref >90)
GLUCOSE: 87 mg/dL (ref 70–99)
POTASSIUM: 4.2 meq/L (ref 3.7–5.3)
SODIUM: 138 meq/L (ref 137–147)
TOTAL PROTEIN: 6.9 g/dL (ref 6.0–8.3)
Total Bilirubin: <0.2 mg/dL — ABNORMAL LOW (ref 0.3–1.2)

## 2013-07-23 LAB — CBC
HCT: 34.2 % — ABNORMAL LOW (ref 36.0–46.0)
HEMOGLOBIN: 11.9 g/dL — AB (ref 12.0–15.0)
MCH: 34.7 pg — ABNORMAL HIGH (ref 26.0–34.0)
MCHC: 34.8 g/dL (ref 30.0–36.0)
MCV: 99.7 fL (ref 78.0–100.0)
Platelets: 185 10*3/uL (ref 150–400)
RBC: 3.43 MIL/uL — AB (ref 3.87–5.11)
RDW: 13.2 % (ref 11.5–15.5)
WBC: 11.3 10*3/uL — ABNORMAL HIGH (ref 4.0–10.5)

## 2013-07-23 NOTE — Pre-Procedure Instructions (Signed)
PERIOPERATIVE PRESCRIPTION FOR IMPLANTED CARDIAC DEVICE PROGRAMMING ON PT'S CHART FROM DR. G. TAYLOR - MAGNET TO BE PLACED OVER DEVICE DURING SURGERY BUT POST OP INTERROGATION NOT NEEDED. PT HAS EKG REPORT AND CARDIOLOGY OFFICE NOTE IN EPIC FROM DR. [place] 05/04/13. CHEST CT REPORT IS IN EPIC FROM 07/05/13.

## 2013-07-23 NOTE — Patient Instructions (Addendum)
PER [facility] FLU POLICY  - NO VISITORS UNDER 54 YEARS OF AGE.   YOUR SURGERY IS SCHEDULED AT [facility]  ON:  Tuesday  2/3  REPORT TO  SHORT STAY CENTER AT:  5:15 AM      PHONE # FOR SHORT STAY IS [phone]  DO NOT EAT OR DRINK ANYTHING AFTER MIDNIGHT THE NIGHT BEFORE YOUR SURGERY.  YOU MAY BRUSH YOUR TEETH, RINSE OUT YOUR MOUTH--BUT NO WATER, NO FOOD, NO CHEWING GUM, NO MINTS, NO CANDIES, NO CHEWING TOBACCO.  PLEASE TAKE THE FOLLOWING MEDICATIONS THE AM OF YOUR SURGERY WITH A FEW SIPS OF WATER:  AMIODARONE, CARVEDILOL, HYDROCODONE / ACETAMINOPHEN, LEVOTHYROXINE.    DO NOT BRING VALUABLES, MONEY, CREDIT CARDS.  DO NOT WEAR JEWELRY, MAKE-UP, NAIL POLISH AND NO METAL PINS OR CLIPS IN YOUR HAIR. CONTACT LENS, DENTURES / PARTIALS, GLASSES SHOULD NOT BE WORN TO SURGERY AND IN MOST CASES-HEARING AIDS WILL NEED TO BE REMOVED.  BRING YOUR GLASSES CASE, ANY EQUIPMENT NEEDED FOR YOUR CONTACT LENS. FOR PATIENTS ADMITTED TO THE HOSPITAL--CHECK OUT TIME THE DAY OF DISCHARGE IS 11:00 AM.  ALL INPATIENT ROOMS ARE PRIVATE - WITH BATHROOM, TELEPHONE, TELEVISION AND WIFI INTERNET.  IF YOU ARE BEING DISCHARGED THE SAME DAY OF YOUR SURGERY--YOU CAN NOT DRIVE YOURSELF HOME--AND SHOULD NOT GO HOME ALONE BY TAXI OR BUS.  NO DRIVING OR OPERATING MACHINERY, DO NOT MAKE LEGAL DECISIONS FOR 24 HOURS FOLLOWING ANESTHESIA / PAIN MEDICATIONS.  PLEASE MAKE ARRANGEMENTS FOR SOMEONE TO BE WITH YOU AT HOME THE FIRST 24 HOURS AFTER SURGERY. RESPONSIBLE DRIVER'S NAME / PHONE                                                   PLEASE READ OVER ANY  FACT SHEETS THAT YOU WERE GIVEN:  INCENTIVE SPIROMETER INFORMATION.  AFTER YOUR SURGERY - REMEMBER TO DO DEEP BREATHING, COUGHING, LEG EXERCISES TO PREVENT BLOOD CLOTS AND LUNG COMPLICATIONS.  FAILURE TO FOLLOW THESE INSTRUCTIONS MAY RESULT IN THE CANCELLATION OF YOUR SURGERY. PLEASE BE AWARE THAT YOU MAY NEED ADDITIONAL BLOOD DRAWN DAY OF YOUR SURGERY  PATIENT  SIGNATURE_________________________________

## 2013-07-26 ENCOUNTER — Telehealth: Payer: Self-pay | Admitting: *Deleted

## 2013-07-26 NOTE — Telephone Encounter (Signed)
Called pt with reminder for Clear liquids only today and NPO after midnight. Pt verbalized understanding. No further concerns.

## 2013-07-26 NOTE — Anesthesia Preprocedure Evaluation (Addendum)
Anesthesia Evaluation  Patient identified by MRN, date of birth, ID band Patient awake    Reviewed: Allergy & Precautions, H&P , NPO status , Patient's Chart, lab work & pertinent test results  Airway Mallampati: II TM Distance: >3 FB Neck ROM: Full    Dental no notable dental hx. (+) Teeth Intact and Dental Advisory Given,    Pulmonary neg pulmonary ROS, pneumonia -, Current Smoker,  breath sounds clear to auscultation  Pulmonary exam normal       Cardiovascular Exercise Tolerance: Good hypertension, Pt. on home beta blockers + angina + CAD, + Past MI and +CHF + dysrhythmias Ventricular Tachycardia + pacemaker + Cardiac Defibrillator Rhythm:Regular Rate:Normal  S/P MI and CABG in 2011. Postop V tach requiring an AICD. Recent lead replacement in August 2013. AICD has never fired. Prescription on chart from Dr. Lovena Le. No intervention required today.  Dr. Linard Millers is regular cardiologist. No recent angina.   Neuro/Psych negative neurological ROS  negative psych ROS   GI/Hepatic negative GI ROS, Neg liver ROS, GERD-  ,  Endo/Other  Hypothyroidism   Renal/GU negative Renal ROS  negative genitourinary   Musculoskeletal negative musculoskeletal ROS (+)   Abdominal (+) + obese,   Peds  Hematology negative hematology ROS (+) anemia ,   Anesthesia Other Findings   Reproductive/Obstetrics negative OB ROS                         Anesthesia Physical Anesthesia Plan  ASA: III  Anesthesia Plan: General   Post-op Pain Management:    Induction: Intravenous  Airway Management Planned: LMA  Additional Equipment:   Intra-op Plan:   Post-operative Plan: Extubation in OR  Informed Consent: I have reviewed the patients History and Physical, chart, labs and discussed the procedure including the risks, benefits and alternatives for the proposed anesthesia with the patient or authorized representative who  has indicated his/her understanding and acceptance.   Dental advisory given  Plan Discussed with: CRNA  Anesthesia Plan Comments:        Anesthesia Quick Evaluation

## 2013-07-27 ENCOUNTER — Encounter (HOSPITAL_COMMUNITY)
Admission: RE | Disposition: A | Discharge status: Home / Self Care | Payer: Self-pay | Source: Ambulatory Visit | Attending: Gynecology

## 2013-07-27 ENCOUNTER — Ambulatory Visit (HOSPITAL_COMMUNITY)
Admission: RE | Admit: 2013-07-27 | Discharge: 2013-07-27 | Disposition: A | Discharge status: Home / Self Care | Payer: BC Managed Care – PPO | Source: Ambulatory Visit | Attending: Gynecology | Admitting: Gynecology

## 2013-07-27 ENCOUNTER — Encounter (HOSPITAL_COMMUNITY): Payer: BC Managed Care – PPO | Admitting: Anesthesiology

## 2013-07-27 ENCOUNTER — Ambulatory Visit (HOSPITAL_COMMUNITY): Payer: BC Managed Care – PPO | Admitting: Anesthesiology

## 2013-07-27 ENCOUNTER — Encounter (HOSPITAL_COMMUNITY): Payer: Self-pay | Admitting: *Deleted

## 2013-07-27 DIAGNOSIS — Z79899 Other long term (current) drug therapy: Secondary | ICD-10-CM | POA: Insufficient documentation

## 2013-07-27 DIAGNOSIS — D3910 Neoplasm of uncertain behavior of unspecified ovary: Secondary | ICD-10-CM

## 2013-07-27 DIAGNOSIS — F17200 Nicotine dependence, unspecified, uncomplicated: Secondary | ICD-10-CM | POA: Insufficient documentation

## 2013-07-27 DIAGNOSIS — I252 Old myocardial infarction: Secondary | ICD-10-CM | POA: Insufficient documentation

## 2013-07-27 DIAGNOSIS — Z9079 Acquired absence of other genital organ(s): Secondary | ICD-10-CM | POA: Insufficient documentation

## 2013-07-27 DIAGNOSIS — E039 Hypothyroidism, unspecified: Secondary | ICD-10-CM | POA: Insufficient documentation

## 2013-07-27 DIAGNOSIS — Z8551 Personal history of malignant neoplasm of bladder: Secondary | ICD-10-CM | POA: Insufficient documentation

## 2013-07-27 DIAGNOSIS — I10 Essential (primary) hypertension: Secondary | ICD-10-CM | POA: Insufficient documentation

## 2013-07-27 DIAGNOSIS — Z954 Presence of other heart-valve replacement: Secondary | ICD-10-CM | POA: Insufficient documentation

## 2013-07-27 DIAGNOSIS — I509 Heart failure, unspecified: Secondary | ICD-10-CM | POA: Insufficient documentation

## 2013-07-27 DIAGNOSIS — Z90710 Acquired absence of both cervix and uterus: Secondary | ICD-10-CM | POA: Insufficient documentation

## 2013-07-27 DIAGNOSIS — Z96649 Presence of unspecified artificial hip joint: Secondary | ICD-10-CM | POA: Insufficient documentation

## 2013-07-27 DIAGNOSIS — C779 Secondary and unspecified malignant neoplasm of lymph node, unspecified: Principal | ICD-10-CM

## 2013-07-27 DIAGNOSIS — Z9089 Acquired absence of other organs: Secondary | ICD-10-CM | POA: Insufficient documentation

## 2013-07-27 DIAGNOSIS — C569 Malignant neoplasm of unspecified ovary: Secondary | ICD-10-CM | POA: Insufficient documentation

## 2013-07-27 DIAGNOSIS — C50919 Malignant neoplasm of unspecified site of unspecified female breast: Secondary | ICD-10-CM | POA: Insufficient documentation

## 2013-07-27 DIAGNOSIS — Z95810 Presence of automatic (implantable) cardiac defibrillator: Secondary | ICD-10-CM | POA: Insufficient documentation

## 2013-07-27 DIAGNOSIS — K219 Gastro-esophageal reflux disease without esophagitis: Secondary | ICD-10-CM | POA: Insufficient documentation

## 2013-07-27 DIAGNOSIS — I2510 Atherosclerotic heart disease of native coronary artery without angina pectoris: Secondary | ICD-10-CM | POA: Insufficient documentation

## 2013-07-27 DIAGNOSIS — Z951 Presence of aortocoronary bypass graft: Secondary | ICD-10-CM | POA: Insufficient documentation

## 2013-07-27 HISTORY — DX: Unspecified asthma, uncomplicated: J45.909

## 2013-07-27 HISTORY — PX: ABDOMINAL HYSTERECTOMY: SHX81

## 2013-07-27 SURGERY — HYSTERECTOMY, ABDOMINAL
Anesthesia: General

## 2013-07-27 MED ORDER — NEOSTIGMINE METHYLSULFATE 1 MG/ML IJ SOLN
INTRAMUSCULAR | Status: AC
  Filled 2013-07-27: qty 10

## 2013-07-27 MED ORDER — ONDANSETRON HCL 4 MG/2ML IJ SOLN
INTRAMUSCULAR | Status: AC
  Filled 2013-07-27: qty 2

## 2013-07-27 MED ORDER — CISATRACURIUM BESYLATE 20 MG/10ML IV SOLN
INTRAVENOUS | Status: AC
  Filled 2013-07-27: qty 10

## 2013-07-27 MED ORDER — DEXAMETHASONE SODIUM PHOSPHATE 10 MG/ML IJ SOLN
INTRAMUSCULAR | Status: AC
  Filled 2013-07-27: qty 1

## 2013-07-27 MED ORDER — EPHEDRINE SULFATE 50 MG/ML IJ SOLN
INTRAMUSCULAR | Status: AC
  Filled 2013-07-27: qty 1

## 2013-07-27 MED ORDER — PROPOFOL 10 MG/ML IV BOLUS
INTRAVENOUS | Status: DC | PRN
  Administered 2013-07-27: 160 mg via INTRAVENOUS

## 2013-07-27 MED ORDER — SODIUM CHLORIDE 0.9 % IJ SOLN
INTRAMUSCULAR | Status: AC
  Filled 2013-07-27: qty 10

## 2013-07-27 MED ORDER — LIDOCAINE HCL (CARDIAC) 20 MG/ML IV SOLN
INTRAVENOUS | Status: DC | PRN
  Administered 2013-07-27: 50 mg via INTRAVENOUS

## 2013-07-27 MED ORDER — ACETAMINOPHEN 650 MG RE SUPP
650.0000 mg | RECTAL | Status: DC | PRN
  Filled 2013-07-27: qty 1

## 2013-07-27 MED ORDER — LIDOCAINE HCL (CARDIAC) 20 MG/ML IV SOLN
INTRAVENOUS | Status: AC
  Filled 2013-07-27: qty 5

## 2013-07-27 MED ORDER — ONDANSETRON HCL 4 MG/2ML IJ SOLN: 4.0000 mg | Freq: Four times a day (QID) | INTRAMUSCULAR | Status: DC | PRN

## 2013-07-27 MED ORDER — MIDAZOLAM HCL 5 MG/5ML IJ SOLN
INTRAMUSCULAR | Status: DC | PRN
  Administered 2013-07-27 (×2): 1 mg via INTRAVENOUS

## 2013-07-27 MED ORDER — OXYCODONE HCL 5 MG PO TABS: 5.0000 mg | ORAL_TABLET | ORAL | Status: DC | PRN

## 2013-07-27 MED ORDER — SUFENTANIL CITRATE 50 MCG/ML IV SOLN
INTRAVENOUS | Status: AC
  Filled 2013-07-27: qty 1

## 2013-07-27 MED ORDER — SODIUM CHLORIDE 0.9 % IJ SOLN: 3.0000 mL | INTRAMUSCULAR | Status: DC | PRN

## 2013-07-27 MED ORDER — OXYCODONE-ACETAMINOPHEN 5-325 MG PO TABS: 2.0000 | ORAL_TABLET | Freq: Four times a day (QID) | ORAL | Status: DC | PRN

## 2013-07-27 MED ORDER — EPHEDRINE SULFATE 50 MG/ML IJ SOLN
INTRAMUSCULAR | Status: DC | PRN
  Administered 2013-07-27 (×2): 5 mg via INTRAVENOUS

## 2013-07-27 MED ORDER — SODIUM CHLORIDE 0.9 % IV SOLN: 250.0000 mL | INTRAVENOUS | Status: DC | PRN

## 2013-07-27 MED ORDER — HYDROMORPHONE HCL PF 1 MG/ML IJ SOLN
INTRAMUSCULAR | Status: AC
  Filled 2013-07-27: qty 1

## 2013-07-27 MED ORDER — PROMETHAZINE HCL 25 MG/ML IJ SOLN
6.2500 mg | INTRAMUSCULAR | Status: DC | PRN
  Administered 2013-07-27: 6.25 mg via INTRAVENOUS

## 2013-07-27 MED ORDER — SODIUM CHLORIDE 0.9 % IJ SOLN: 3.0000 mL | Freq: Two times a day (BID) | INTRAMUSCULAR | Status: DC

## 2013-07-27 MED ORDER — ACETAMINOPHEN 325 MG PO TABS
650.0000 mg | ORAL_TABLET | ORAL | Status: DC | PRN
Start: 2013-07-27 — End: 2013-07-27

## 2013-07-27 MED ORDER — GLYCOPYRROLATE 0.2 MG/ML IJ SOLN
INTRAMUSCULAR | Status: AC
  Filled 2013-07-27: qty 3

## 2013-07-27 MED ORDER — PROMETHAZINE HCL 25 MG/ML IJ SOLN
INTRAMUSCULAR | Status: AC
  Filled 2013-07-27: qty 1

## 2013-07-27 MED ORDER — LACTATED RINGERS IV SOLN: INTRAVENOUS | Status: DC | PRN

## 2013-07-27 MED ORDER — DEXAMETHASONE SODIUM PHOSPHATE 10 MG/ML IJ SOLN
INTRAMUSCULAR | Status: DC | PRN
  Administered 2013-07-27: 10 mg via INTRAVENOUS

## 2013-07-27 MED ORDER — ONDANSETRON HCL 4 MG/2ML IJ SOLN
INTRAMUSCULAR | Status: DC | PRN
  Administered 2013-07-27: 4 mg via INTRAVENOUS

## 2013-07-27 MED ORDER — BUPIVACAINE LIPOSOME 1.3 % IJ SUSP
20.0000 mL | Freq: Once | INTRAMUSCULAR | Status: DC
Start: 2013-07-27 — End: 2013-07-27
  Filled 2013-07-27: qty 20

## 2013-07-27 MED ORDER — SODIUM CHLORIDE 0.9 % IV SOLN
INTRAVENOUS | Status: DC | PRN
  Administered 2013-07-27 (×2): via INTRAVENOUS

## 2013-07-27 MED ORDER — BUPIVACAINE LIPOSOME 1.3 % IJ SUSP
INTRAMUSCULAR | Status: DC | PRN
  Administered 2013-07-27: 20 mL

## 2013-07-27 MED ORDER — MIDAZOLAM HCL 2 MG/2ML IJ SOLN
INTRAMUSCULAR | Status: AC
  Filled 2013-07-27: qty 2

## 2013-07-27 MED ORDER — PROPOFOL 10 MG/ML IV BOLUS
INTRAVENOUS | Status: AC
  Filled 2013-07-27: qty 20

## 2013-07-27 MED ORDER — HYDROMORPHONE HCL PF 1 MG/ML IJ SOLN
0.2500 mg | INTRAMUSCULAR | Status: DC | PRN
  Administered 2013-07-27: 0.5 mg via INTRAVENOUS

## 2013-07-27 MED ORDER — LACTATED RINGERS IV SOLN: INTRAVENOUS | Status: DC

## 2013-07-27 MED ORDER — FENTANYL CITRATE 0.05 MG/ML IJ SOLN
INTRAMUSCULAR | Status: AC
  Filled 2013-07-27: qty 5

## 2013-07-27 MED ORDER — FENTANYL CITRATE 0.05 MG/ML IJ SOLN
INTRAMUSCULAR | Status: DC | PRN
  Administered 2013-07-27 (×2): 50 ug via INTRAVENOUS

## 2013-07-27 SURGICAL SUPPLY — 36 items
ATTRACTOMAT 16X20 MAGNETIC DRP (DRAPES) ×2 IMPLANT
BLADE EXTENDED COATED 6.5IN (ELECTRODE) ×2 IMPLANT
CANISTER SUCTION 2500CC (MISCELLANEOUS) ×2 IMPLANT
CHLORAPREP W/TINT 26ML (MISCELLANEOUS) ×2 IMPLANT
CLIP TI MEDIUM LARGE 6 (CLIP) ×2 IMPLANT
CONT SPEC 4OZ CLIKSEAL STRL BL (MISCELLANEOUS) ×2 IMPLANT
COVER SURGICAL LIGHT HANDLE (MISCELLANEOUS) IMPLANT
DRAPE UTILITY W/TAPE 26X15 (DRAPES) ×2 IMPLANT
DRAPE WARM FLUID 44X44 (DRAPE) ×2 IMPLANT
ELECT REM PT RETURN 9FT ADLT (ELECTROSURGICAL) ×2
ELECTRODE REM PT RTRN 9FT ADLT (ELECTROSURGICAL) ×1 IMPLANT
GAUZE SPONGE 4X4 16PLY XRAY LF (GAUZE/BANDAGES/DRESSINGS) IMPLANT
GLOVE BIO SURGEON STRL SZ 6.5 (GLOVE) ×2 IMPLANT
GLOVE BIOGEL M STRL SZ7.5 (GLOVE) ×10 IMPLANT
GOWN STRL REUS W/TWL LRG LVL3 (GOWN DISPOSABLE) ×5 IMPLANT
GOWN STRL REUS W/TWL XL LVL3 (GOWN DISPOSABLE) ×1 IMPLANT
KIT BASIN OR (CUSTOM PROCEDURE TRAY) ×2 IMPLANT
NS IRRIG 1000ML POUR BTL (IV SOLUTION) ×5 IMPLANT
PACK GENERAL/GYN (CUSTOM PROCEDURE TRAY) ×2 IMPLANT
SHEET LAVH (DRAPES) ×2 IMPLANT
SPONGE GAUZE 4X4 12PLY (GAUZE/BANDAGES/DRESSINGS) ×2 IMPLANT
SPONGE LAP 18X18 X RAY DECT (DISPOSABLE) ×2 IMPLANT
STAPLER VISISTAT 35W (STAPLE) ×2 IMPLANT
SUT MNCRL AB 4-0 PS2 18 (SUTURE) ×1 IMPLANT
SUT PDS AB 1 CTXB1 36 (SUTURE) ×4 IMPLANT
SUT VIC AB 0 CT1 36 (SUTURE) ×4 IMPLANT
SUT VIC AB 2-0 CT1 27 (SUTURE) ×2
SUT VIC AB 2-0 CT1 TAPERPNT 27 (SUTURE) IMPLANT
SUT VIC AB 2-0 CT2 27 (SUTURE) ×6 IMPLANT
SUT VIC AB 2-0 SH 27 (SUTURE) ×2
SUT VIC AB 2-0 SH 27X BRD (SUTURE) ×2 IMPLANT
SUT VICRYL 2 0 18  UND BR (SUTURE) ×1
SUT VICRYL 2 0 18 UND BR (SUTURE) ×1 IMPLANT
TOWEL OR 17X26 10 PK STRL BLUE (TOWEL DISPOSABLE) ×2 IMPLANT
TOWEL OR NON WOVEN STRL DISP B (DISPOSABLE) ×2 IMPLANT
TRAY FOLEY CATH 14FRSI W/METER (CATHETERS) ×2 IMPLANT

## 2013-07-27 NOTE — Interval H&P Note (Signed)
History and Physical Interval Note:  07/27/2013 7:04 AM  [name]  has presented today for surgery, with the diagnosis of RECURRENT GRANULOSA CELL TUMOR  The various methods of treatment have been discussed with the patient and family. After consideration of risks, benefits and other options for treatment, the patient has consented to  Procedure(s): TUMOR EXCISION OF ABDOMINAL MASS (N/A) as a surgical intervention .  The patient's history has been reviewed, patient examined, no change in status, stable for surgery.  I have reviewed the patient's chart and labs.  Questions were answered to the patient's satisfaction.     CLARKE-PEARSON,Lauralynn Loeb L

## 2013-07-27 NOTE — Transfer of Care (Signed)
Immediate Anesthesia Transfer of Care Note  Patient: [name]  Procedure(s) Performed: Procedure(s): TUMOR EXCISION OF ABDOMINAL MASS (N/A)  Patient Location: PACU  Anesthesia Type:General  Level of Consciousness: awake, alert , oriented, patient cooperative and responds to stimulation  Airway & Oxygen Therapy: Patient Spontanous Breathing and Patient connected to face mask oxygen  Post-op Assessment: Report given to PACU RN, Post -op Vital signs reviewed and stable and Patient moving all extremities  Post vital signs: Reviewed and stable  Complications: No apparent anesthesia complications

## 2013-07-27 NOTE — Discharge Instructions (Addendum)
Wound Care  HOME CARE   Only take medicine as told by your doctor.  Clean the wound daily with mild soap and water.  Change any bandages (dressings) as told by your doctor.  Put medicated cream and a bandage on the wound as told by your doctor.  Change the bandage if it gets wet, dirty, or starts to smell.  Take showers. Do not take baths, swim, or do anything that puts your wound under water.  Rest and raise (elevate) the wound until the pain and puffiness (swelling) are better.  Keep all doctor visits as told. GET HELP RIGHT AWAY IF:   Yellowish-white fluid (pus) comes from the wound.  Medicine does not lessen your pain.  There is a red streak going away from the wound.  You have a fever. MAKE SURE YOU:   Understand these instructions.  Will watch your condition.  Will get help right away if you are not doing well or get worse. Document Released: 03/19/2008 Document Revised: 09/02/2011 Document Reviewed: 10/14/2010 [facility] Patient Information 2014 [place]. 07/27/2013  Return to work: 1 day  Activity: Do Not drive if you are taking narcotic pain medicine.  Shower daily.  Use soap and water on your incision and pat dry; don't rub.     Diet: 1. Low sodium Heart Healthy Diet is recommended.  2. It is safe to use a laxative if you have difficulty moving your bowels.   Wound Care: 1. Keep clean and dry.  Shower daily.  Reasons to call the Doctor:   Fever - Oral temperature greater than 100.4 degrees Fahrenheit  Foul-smelling vaginal discharge  Difficulty urinating  Nausea and vomiting  Increased pain at the site of the incision that is unrelieved with pain medicine.  Difficulty breathing with or without chest pain  New calf pain especially if only on one side  Sudden, continuing increased vaginal bleeding with or without clots.     Contacts: For questions or concerns you should contact:  Dr. Lahoma Crocker at [phone]  Dr.  Marti Sleigh at [facility] [phone]

## 2013-07-27 NOTE — Preoperative (Signed)
Beta Blockers   Reason not to administer Beta Blockers:Not Applicable 

## 2013-07-27 NOTE — Op Note (Signed)
MARLA POULIOT  female MEDICAL RECORD TK:[id number] DATE OF BIRTH: 05-19-1961 PHYSICIAN: Marti Sleigh, M.D  07/27/2013   OPERATIVE REPORT  PREOPERATIVE DIAGNOSIS: Recurrent granulosa cell tumor  POSTOPERATIVE DIAGNOSIS: Same  PROCEDURE: Wide local excision of subcutaneous tumor nodule (3 cm)  SURGEON: Marti Sleigh, M.D ASSISTANT: Lahoma Crocker, MD ANESTHESIA: LMA ESTIMATED BLOOD LOSS: Minimal  SURGICAL FINDINGS: In the subcutaneous layer to the right lower abdomen the knees a prior Pfannenstiel incision was a 3 cm tumor nodule recognized on CT scan. This been previously biopsied and confirmed to be recurrent granulosa cell tumor. At the conclusion of procedure no gross disease remained.  PROCEDURE: Patient brought to the operating room and after satisfactory attainment of general LMA anesthesia was prepped, a Foley catheter was inserted, and the patient was draped. Surgical timeout was taken. The tumor nodule in the right lower abdomen was palpated. A 4-5 cm incision along the prior Pfannenstiel incision was made and carried down in the subcutaneous tissue. Initially we dissected wide of the tumor nodule working in adipose tissue until a nodule somewhat mobilized. Figure-of-eight suture was then placed in the nodule for traction. With upward traction we continued to excise the nodule using the Bovie. The dissection was carried down to the rectus fascia. The fascia was not entered. Hemostasis achieved with cautery. 20 cc of experil was injected in the subcutaneous layer. The wound was irrigated. Subcutaneous layer was reapproximated with interrupted 2-0 Vicryl sutures. The skin was closed in a running subcuticular suture of 4-0 Monocryl. Steri-Strips were applied. Dressing was applied. Patient was awakened from anesthesia and taken to the recovery room in satisfactory condition. Sponge needle and isthmic counts correct x2.   Marti Sleigh, M.D

## 2013-07-27 NOTE — H&P (View-Only) (Signed)
Consult Note: Gyn-Onc   CHAMIRA GOEGLEIN 53 y.o. female  Chief Complaint  Patient presents with  . 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 Status post 4 cycles of carboplatin Taxol chemotherapy.  Plan:    Fine-needle aspiration of the abdominal wall mass is scheduled the performed next week. If this confirms recurrent disease, I would recommend surgical excision. We will hold operating time on February 3. It may be that needle localization or  A tattoo would be helpful since his masses not palpable.    Interval history: The patient returns today for continuing followup as previously scheduled. She is now received 4 cycles of carboplatin and Taxol. Chemotherapy was discontinued after 4 cycles because the patient was not tolerating it. Noted that her inhibin B. in mid-December had risen from 12-39. Her AMH is been normal. A repeat CT scan  Shows a subcutaneous mass in the lower abdomen just to the right of midline incision. Review of her prior CT in May shows a similar mass although the mass now slightly smaller.  Patient seizure recovered from chemotherapy well and has returned to work part-time basis. She has minimal peripheral neuropathy. She denies any GI or GU symptoms.    HPI: 53 year old white female seen in consultation request of [name] regarding management of recurrent granulosa cell tumor. Patient first underwent surgery in 2007 vitamin stage I C. granulosa cell tumor. She received 3 cycles of bleomycin, etoposide, and cisplatin chemotherapy as an adjunct.  Recently the patient developed a lower nominal pain and noticed a "knot" in the region of her prior Pfannenstiel incision. A CT scan was obtained showing a large mass in the anterior abdominal wall as well as 2 other masses in the pelvis. The anterior mass measures 9.5 x 10 x 11.5 cm with cystic and solid components. In addition there 2 masses in the pelvis measuring  5.3 x 4.3 and 5.5 x 5.0 cm. A fine-needle biopsy of the mass is then obtained confirming recurrent granulosa cell tumor. The mass seems to extend into the anterior abdominal wall in the pelvis.  She underwent radical debulking of her recurrent granulosa cell tumor on 01/20/2013. All disease was resected. This required a low rectal anastomosis and resection of distal ureter with reimplantation.  Postoperatively, adjuvant therapy was recommended using carboplatin and Taxol. She received 4 cycles but did not tolerated very well and therefore discontinued. He CT scan in January 2015 showed a subcutaneous nodule in the lower abdomen and the inhibin Bl is a 39 units per mL.   Review of Systems:10 point review of systems is negative except as noted in interval history.   Vitals: Blood pressure 122/59, pulse 78, temperature 98.9 F (37.2 C), resp. rate 16, height 5\' 6"  (1.676 m), weight 190 lb 1.6 oz (86.229 kg), SpO2 99.00%.  Physical Exam: General : The patient is a healthy woman in no acute distress.  HEENT: normocephalic, extraoccular movements normal; neck is supple without thyromegally  Lynphnodes: Supraclavicular and inguinal nodes not enlarged  Abdomen: Soft, non-tender, no ascites, no organomegally, midline incision is well-healed. On careful palpation am unable to locate the mass that was identified on CT scan.  Pelvic:  EGBUS: Normal  Vagina: Normal no lesions are noted  Cervix and uterus are surgically absent  Bimanual and rectovaginal exam revealed no masses induration or nodularity.   Lower extremities: No edema or varicosities. Normal range of motion  Allergies  Allergen Reactions  . Latex Hives, Rash and Other (See Comments)    LATEX TAPE "makes almost a raw spot"; tolerates latex gloves; tolerates paper tape  . Prochlorperazine Edisylate Other (See Comments)    Nervous/ flutter/ shakes--compazine    Past Medical History  Diagnosis Date  . Arrhythmia      ventricular tachycardia  . Unspecified systolic heart failure   . Ventricular fibrillation   . Coronary artery disease   . ICD (implantable cardiac defibrillator) in place   . Pacemaker   . High cholesterol   . CHF (congestive heart failure)   . Myocardial infarction 2011  . Hypothyroidism   . Arthritis 02/06/2012    "everywhere"  . Bladder cancer 2009    "injected medicine to get rid of it"  . Granulosa cell carcinoma of ovary 2007    right; "had chemo"  . Pneumonia     as child    Past Surgical History  Procedure Laterality Date  . Cardiac defibrillator placement  02/06/2012    "lead change"  . Insert / replace / remove pacemaker  10/2009    [place]  . Tonsillectomy and adenoidectomy  ~ 1967  . Appendectomy  1989  . Vaginal hysterectomy  2001  . Tubal ligation  1993  . Bilateral oophorectomy  2007  . Mitral valve replacement  2011    "pig valve"  . Cardiac valve replacement    . Transurethral resection of bladder  2009    "for bladder cancer"  . Thymus removed    . Total hip arthroplasty  05/06/2012    Procedure: TOTAL HIP ARTHROPLASTY;  Surgeon: Gearlean Alf, MD;  Location: [facility];  Service: Orthopedics;  Laterality: Right;  . Laparotomy N/A 01/19/2013    Procedure: TUMOR DEBULKING / BOWEL RESECTION/INSERTION RIGHT UTERERAL DOUBLE J STENT/OMENTECTOMY;  Surgeon: Alvino Chapel, MD;  Location: [facility];  Service: Gynecology;  Laterality: N/A;  . Cystoscopy Right 02/23/2013    Procedure: CYSTOSCOPY WITH STENT REMOVAL;  Surgeon: Alvino Chapel, MD;  Location: [facility];  Service: Gynecology;  Laterality: Right;    Current Outpatient Prescriptions  Medication Sig Dispense Refill  . amiodarone (PACERONE) 200 MG tablet 1/2 tablet by mouth daily  30 tablet  6  . aspirin 325 MG EC tablet Take 325 mg by mouth daily.      Marland Kitchen atorvastatin (LIPITOR) 40 MG tablet Take 1 tablet (40 mg total) by mouth every evening.  30 tablet  5  . carvedilol  (COREG) 12.5 MG tablet Take 1 tablet (12.5 mg total) by mouth 2 (two) times daily with a meal.  60 tablet  11  . cholecalciferol (VITAMIN D) 1000 UNITS tablet Take 1,000 Units by mouth 2 (two) times daily.      . fish oil-omega-3 fatty acids 1000 MG capsule Take 1 g by mouth 2 (two) times daily.      Marland Kitchen HYDROcodone-acetaminophen (NORCO/VICODIN) 5-325 MG per tablet Take 1 tablet by mouth 5 (five) times daily as needed.  120 tablet  0  . levothyroxine (SYNTHROID, LEVOTHROID) 75 MCG tablet Take 1 tablet (75 mcg total) by mouth daily before breakfast.  30 tablet  6  . lidocaine-prilocaine (EMLA) cream Apply topically as needed. Place a cotton ball size amt to port 45 min prior to use & cover with plastic wrap.  30 g  2  . meloxicam (MOBIC) 15 MG tablet Take 1 tablet (15 mg total) by mouth daily.  30 tablet  2  . Multiple Vitamins-Minerals (MULTIVITAMIN PO) Take 1 tablet by mouth every morning.       . pyridOXINE (VITAMIN B-6) 100 MG tablet Take 100 mg by mouth daily.      Marland Kitchen spironolactone (ALDACTONE) 25 MG tablet Take 1 tablet (25 mg total) by mouth 2 (two) times daily.  60 tablet  11  . zolpidem (AMBIEN) 5 MG tablet Take 1 tablet (5 mg total) by mouth at bedtime as needed for sleep.  30 tablet  2   No current facility-administered medications for this visit.    History   Social History  . Marital Status: Divorced    Spouse Name: N/A    Number of Children: N/A  . Years of Education: N/A   Occupational History  .      WORKS FULL TIME   Social History Main Topics  . Smoking status: Current Some Day Smoker -- 0.25 packs/day for 30 years    Types: Cigarettes  . Smokeless tobacco: Never Used  . Alcohol Use: No  . Drug Use: No  . Sexual Activity: Not Currently   Other Topics Concern  . Not on file   Social History Narrative   WORKS FULL TIME   SINGLE   TOBACCO USE-YES   IMPLANTATION OF DUAL-CHAMBER [employer]DE DEFIBRILLATOR    No family history on file.    Alvino Chapel,  MD 07/09/2013, 12:20 PM

## 2013-07-27 NOTE — Progress Notes (Signed)
Dr. Winfred Leeds made aware of patient's blood pressures in PACU.

## 2013-07-27 NOTE — Anesthesia Procedure Notes (Signed)
Procedure Name: LMA Insertion Date/Time: 07/27/2013 7:30 AM Performed by: Ofilia Neas Pre-anesthesia Checklist: Patient identified, Patient being monitored, Timeout performed, Emergency Drugs available and Suction available Patient Re-evaluated:Patient Re-evaluated prior to inductionOxygen Delivery Method: Circle system utilized Preoxygenation: Pre-oxygenation with 100% oxygen Intubation Type: IV induction LMA: LMA inserted LMA Size: 4.0 Number of attempts: 1 Placement Confirmation: positive ETCO2 Tube secured with: Tape (paper tape) Dental Injury: Teeth and Oropharynx as per pre-operative assessment

## 2013-07-27 NOTE — Progress Notes (Signed)
Foley catheter discontinued with 50 cc of yellow-colored urine in bag

## 2013-07-28 ENCOUNTER — Encounter (HOSPITAL_COMMUNITY): Payer: Self-pay | Admitting: Gynecology

## 2013-07-28 ENCOUNTER — Other Ambulatory Visit: Payer: Self-pay | Admitting: Hematology & Oncology

## 2013-07-28 NOTE — Anesthesia Postprocedure Evaluation (Signed)
Anesthesia Post Note  Patient: [name]  Procedure(s) Performed: Procedure(s) (LRB): TUMOR EXCISION OF ABDOMINAL MASS (N/A)  Anesthesia type: General  Patient location: PACU  Post pain: Pain level controlled  Post assessment: Post-op Vital signs reviewed  Last Vitals:  Filed Vitals:   07/27/13 1024  BP: 106/54  Pulse: 67  Temp:   Resp: 16    Post vital signs: Reviewed  Level of consciousness: sedated  Complications: No apparent anesthesia complications

## 2013-07-29 ENCOUNTER — Telehealth: Payer: Self-pay | Admitting: Gynecologic Oncology

## 2013-07-29 NOTE — Telephone Encounter (Signed)
Called to check on patient's post-op status.  Message left asking the patient to please call the office.

## 2013-08-02 ENCOUNTER — Telehealth: Payer: Self-pay | Admitting: Gynecologic Oncology

## 2013-08-02 NOTE — Telephone Encounter (Signed)
Telephone call to check on patient post-operatively.  Reporting doing well with abdominal incision healing nicely.  No concerns voiced.  Final path results discussed.  Instructed to call for any questions or concerns.

## 2013-08-26 ENCOUNTER — Other Ambulatory Visit (HOSPITAL_BASED_OUTPATIENT_CLINIC_OR_DEPARTMENT_OTHER): Payer: BC Managed Care – PPO | Admitting: Lab

## 2013-08-26 ENCOUNTER — Ambulatory Visit (HOSPITAL_BASED_OUTPATIENT_CLINIC_OR_DEPARTMENT_OTHER): Payer: BC Managed Care – PPO

## 2013-08-26 ENCOUNTER — Ambulatory Visit (HOSPITAL_BASED_OUTPATIENT_CLINIC_OR_DEPARTMENT_OTHER): Payer: BC Managed Care – PPO | Admitting: Hematology & Oncology

## 2013-08-26 ENCOUNTER — Encounter: Payer: Self-pay | Admitting: Hematology & Oncology

## 2013-08-26 ENCOUNTER — Other Ambulatory Visit: Payer: Self-pay | Admitting: *Deleted

## 2013-08-26 VITALS — BP 97/57 | HR 76 | Temp 98.1°F | Resp 14 | Ht 66.0 in | Wt 194.0 lb

## 2013-08-26 DIAGNOSIS — Z87898 Personal history of other specified conditions: Secondary | ICD-10-CM

## 2013-08-26 DIAGNOSIS — D3910 Neoplasm of uncertain behavior of unspecified ovary: Secondary | ICD-10-CM

## 2013-08-26 LAB — CMP (CANCER CENTER ONLY)
ALK PHOS: 52 U/L (ref 26–84)
ALT(SGPT): 20 U/L (ref 10–47)
AST: 31 U/L (ref 11–38)
Albumin: 3.7 g/dL (ref 3.3–5.5)
BUN: 14 mg/dL (ref 7–22)
CO2: 29 mEq/L (ref 18–33)
Calcium: 10.6 mg/dL — ABNORMAL HIGH (ref 8.0–10.3)
Chloride: 100 mEq/L (ref 98–108)
Creat: 1.5 mg/dl — ABNORMAL HIGH (ref 0.6–1.2)
Glucose, Bld: 96 mg/dL (ref 73–118)
POTASSIUM: 4.2 meq/L (ref 3.3–4.7)
SODIUM: 140 meq/L (ref 128–145)
TOTAL PROTEIN: 6.9 g/dL (ref 6.4–8.1)
Total Bilirubin: 0.5 mg/dl (ref 0.20–1.60)

## 2013-08-26 LAB — CBC WITH DIFFERENTIAL (CANCER CENTER ONLY)
BASO#: 0 10*3/uL (ref 0.0–0.2)
BASO%: 0.2 % (ref 0.0–2.0)
EOS ABS: 0.1 10*3/uL (ref 0.0–0.5)
EOS%: 1.4 % (ref 0.0–7.0)
HCT: 37.6 % (ref 34.8–46.6)
HEMOGLOBIN: 12.6 g/dL (ref 11.6–15.9)
LYMPH#: 2.8 10*3/uL (ref 0.9–3.3)
LYMPH%: 29.5 % (ref 14.0–48.0)
MCH: 33.2 pg (ref 26.0–34.0)
MCHC: 33.5 g/dL (ref 32.0–36.0)
MCV: 99 fL (ref 81–101)
MONO#: 0.9 10*3/uL (ref 0.1–0.9)
MONO%: 9 % (ref 0.0–13.0)
NEUT%: 59.9 % (ref 39.6–80.0)
NEUTROS ABS: 5.7 10*3/uL (ref 1.5–6.5)
Platelets: 158 10*3/uL (ref 145–400)
RBC: 3.79 10*6/uL (ref 3.70–5.32)
RDW: 12.4 % (ref 11.1–15.7)
WBC: 9.5 10*3/uL (ref 3.9–10.0)

## 2013-08-26 MED ORDER — HYDROCODONE-ACETAMINOPHEN 5-325 MG PO TABS: 1.0000 | ORAL_TABLET | Freq: Three times a day (TID) | ORAL | Status: DC | PRN

## 2013-08-26 MED ORDER — HYDROCODONE-ACETAMINOPHEN 5-325 MG PO TABS
1.0000 | ORAL_TABLET | Freq: Three times a day (TID) | ORAL | Status: DC | PRN
Start: 2013-08-26 — End: 2014-03-08

## 2013-08-26 MED ORDER — SODIUM CHLORIDE 0.9 % IJ SOLN
10.0000 mL | INTRAMUSCULAR | Status: DC | PRN
  Administered 2013-08-26: 10 mL via INTRAVENOUS
  Filled 2013-08-26: qty 10

## 2013-08-26 MED ORDER — HEPARIN SOD (PORK) LOCK FLUSH 100 UNIT/ML IV SOLN
500.0000 [IU] | Freq: Once | INTRAVENOUS | Status: AC
  Administered 2013-08-26: 500 [IU] via INTRAVENOUS
  Filled 2013-08-26: qty 5

## 2013-08-26 NOTE — Telephone Encounter (Signed)
Printed out another RX for patients pain medicine because Dr. Marin Olp wanted to increase quantity from 30 to 90.

## 2013-08-26 NOTE — Patient Instructions (Signed)
Implanted Port Insertion, Care After Refer to this sheet in the next few weeks. These instructions provide you with information on caring for yourself after your procedure. Your health care provider may also give you more specific instructions. Your treatment has been planned according to current medical practices, but problems sometimes occur. Call your health care provider if you have any problems or questions after your procedure. WHAT TO EXPECT AFTER THE PROCEDURE After your procedure, it is typical to have the following:   Discomfort at the port insertion site. Ice packs to the area will help.  Bruising on the skin over the port. This will subside in 3 4 days. HOME CARE INSTRUCTIONS  After your port is placed, you will get a manufacturer's information card. The card has information about your port. Keep this card with you at all times.   Know what kind of port you have. There are many types of ports available.   Wear a medical alert bracelet in case of an emergency. This can help alert health care workers that you have a port.   The port can stay in for as long as your health care provider believes it is necessary.   A home health care nurse may give medicines and take care of the port.   You or a family member can get special training and directions for giving medicine and taking care of the port at home.  SEEK MEDICAL CARE IF:  Your port does not flush or you are unable to get a blood return.   SEEK IMMEDIATE MEDICAL CARE IF:  You have new fluid or pus coming from your incision.   You notice a bad smell coming from your incision site.   You have swelling, pain, or more redness at the incision or port site.   You have a fever or chills.   You have chest pain or shortness of breath. Document Released: 03/31/2013 Document Reviewed: 02/15/2013 Exit[employer] Patient Information 2014 [employer].  

## 2013-08-26 NOTE — Patient Instructions (Signed)
You Can Quit Smoking If you are ready to quit smoking or are thinking about it, congratulations! You have chosen to help yourself be healthier and live longer! There are lots of different ways to quit smoking. Nicotine gum, nicotine patches, a nicotine inhaler, or nicotine nasal spray can help with physical craving. Hypnosis, support groups, and medicines help break the habit of smoking. TIPS TO GET OFF AND STAY OFF CIGARETTES  Learn to predict your moods. Do not let a bad situation be your excuse to have a cigarette. Some situations in your life might tempt you to have a cigarette.  Ask friends and co-workers not to smoke around you.  Make your home smoke-free.  Never have "just one" cigarette. It leads to wanting another and another. Remind yourself of your decision to quit.  On a card, make a list of your reasons for not smoking. Read it at least the same number of times a day as you have a cigarette. Tell yourself everyday, "I do not want to smoke. I choose not to smoke."  Ask someone at home or work to help you with your plan to quit smoking.  Have something planned after you eat or have a cup of coffee. Take a walk or get other exercise to perk you up. This will help to keep you from overeating.  Try a relaxation exercise to calm you down and decrease your stress. Remember, you may be tense and nervous the first two weeks after you quit. This will pass.  Find new activities to keep your hands busy. Play with a pen, coin, or rubber band. Doodle or draw things on paper.  Brush your teeth right after eating. This will help cut down the craving for the taste of tobacco after meals. You can try mouthwash too.  Try gum, breath mints, or diet candy to keep something in your mouth. IF YOU SMOKE AND WANT TO QUIT:  Do not stock up on cigarettes. Never buy a carton. Wait until one pack is finished before you buy another.  Never carry cigarettes with you at work or at home.  Keep cigarettes  as far away from you as possible. Leave them with someone else.  Never carry matches or a lighter with you.  Ask yourself, "Do I need this cigarette or is this just a reflex?"  Bet with someone that you can quit. Put cigarette money in a piggy bank every morning. If you smoke, you give up the money. If you do not smoke, by the end of the week, you keep the money.  Keep trying. It takes 21 days to change a habit!  Talk to your doctor about using medicines to help you quit. These include nicotine replacement gum, lozenges, or skin patches. Document Released: 04/06/2009 Document Revised: 09/02/2011 Document Reviewed: 04/06/2009 ExitCare Patient Information 2014 [employer].  

## 2013-08-31 LAB — INHIBIN B: Inhibin B: 18 pg/mL

## 2013-08-31 LAB — LACTATE DEHYDROGENASE: LDH: 138 U/L (ref 94–250)

## 2013-09-02 ENCOUNTER — Other Ambulatory Visit: Payer: Self-pay | Admitting: Radiology

## 2013-09-02 ENCOUNTER — Telehealth: Payer: Self-pay | Admitting: *Deleted

## 2013-09-02 NOTE — Telephone Encounter (Addendum)
Message copied by Orlando Penner on Thu Sep 02, 2013  5:13 PM ------      Message from: Burney Gauze R      Created: Thu Sep 02, 2013  6:34 AM       Call - labs look good!!  [name] ------This message given to pt's husband.

## 2013-09-03 ENCOUNTER — Ambulatory Visit: Payer: BC Managed Care – PPO | Attending: Gynecology | Admitting: Gynecology

## 2013-09-03 ENCOUNTER — Encounter: Payer: Self-pay | Admitting: Gynecology

## 2013-09-03 VITALS — BP 112/68 | HR 76 | Temp 98.4°F | Resp 18 | Wt 197.2 lb

## 2013-09-03 DIAGNOSIS — Z9221 Personal history of antineoplastic chemotherapy: Secondary | ICD-10-CM | POA: Insufficient documentation

## 2013-09-03 DIAGNOSIS — Z950 Presence of cardiac pacemaker: Secondary | ICD-10-CM | POA: Insufficient documentation

## 2013-09-03 DIAGNOSIS — Z7982 Long term (current) use of aspirin: Secondary | ICD-10-CM | POA: Insufficient documentation

## 2013-09-03 DIAGNOSIS — I499 Cardiac arrhythmia, unspecified: Secondary | ICD-10-CM | POA: Insufficient documentation

## 2013-09-03 DIAGNOSIS — I2510 Atherosclerotic heart disease of native coronary artery without angina pectoris: Secondary | ICD-10-CM | POA: Insufficient documentation

## 2013-09-03 DIAGNOSIS — E039 Hypothyroidism, unspecified: Secondary | ICD-10-CM | POA: Insufficient documentation

## 2013-09-03 DIAGNOSIS — I509 Heart failure, unspecified: Secondary | ICD-10-CM | POA: Insufficient documentation

## 2013-09-03 DIAGNOSIS — E7800 Pure hypercholesterolemia, unspecified: Secondary | ICD-10-CM | POA: Insufficient documentation

## 2013-09-03 DIAGNOSIS — C569 Malignant neoplasm of unspecified ovary: Secondary | ICD-10-CM

## 2013-09-03 DIAGNOSIS — I252 Old myocardial infarction: Secondary | ICD-10-CM | POA: Insufficient documentation

## 2013-09-03 DIAGNOSIS — F17200 Nicotine dependence, unspecified, uncomplicated: Secondary | ICD-10-CM | POA: Insufficient documentation

## 2013-09-03 DIAGNOSIS — Z79899 Other long term (current) drug therapy: Secondary | ICD-10-CM | POA: Insufficient documentation

## 2013-09-03 DIAGNOSIS — I5020 Unspecified systolic (congestive) heart failure: Secondary | ICD-10-CM | POA: Insufficient documentation

## 2013-09-03 NOTE — Patient Instructions (Signed)
You are to follow up with Dr. Fermin Schwab after blood work results have come back from Dr. Zettie Pho office. Please call us at [phone] after you have received these results and we will gladly make you a follow up appt.

## 2013-09-03 NOTE — Progress Notes (Signed)
Consult Note: Gyn-Onc   [name] 53 y.o. female  Chief Complaint  Patient presents with  . Malignant granulosa cell tumor of ovary    Assessment : Recurrent granulosa cell tumor status post resection of the only known gross disease. Plan: The patient is scheduled to have an inhibin B. obtained in May and she'll see Dr. Marin Olp shortly thereafter. If the inhibin B. has returned to normal, I would not recommend any additional imaging.  I recommend the patient have counseling to help with the stress level and anger that seems to bother her. She will contact us if she chooses to have [place] help her with arranging for a counselor.  Interval history: The patient returns today having undergone resection of a metastatic granulosa cell tumor in a right inguinal lymph node on 07/27/2013. Patient had an uncomplicated postoperative course from this minor procedure. On March 5 inhibin B. was obtained measuring 18 units (prior to surgical resection was 39 units) the patient has done well postoperatively and has no complaints. She does note that she is particularly irritable and feels that she been under a lot of stress.   She denies any GI or GU symptoms.    HPI: 54 year old white female seen in consultation request of [name] regarding management of recurrent granulosa cell tumor. Patient first underwent surgery in 2007 vitamin stage I C. granulosa cell tumor. She received 3 cycles of bleomycin, etoposide, and cisplatin chemotherapy as an adjunct.  Recently the patient developed a lower nominal pain and noticed a "knot" in the region of her prior Pfannenstiel incision. A CT scan was obtained showing a large mass in the anterior abdominal wall as well as 2 other masses in the pelvis. The anterior mass measures 9.5 x 10 x 11.5 cm with cystic and solid components. In addition there 2 masses in the pelvis measuring 5.3 x 4.3 and 5.5 x 5.0 cm. A fine-needle biopsy of the mass is then obtained  confirming recurrent granulosa cell tumor. The mass seems to extend into the anterior abdominal wall in the pelvis.  She underwent radical debulking of her recurrent granulosa cell tumor on 01/20/2013. All disease was resected. This required a low rectal anastomosis and resection of distal ureter with reimplantation.  Postoperatively, adjuvant therapy was recommended using carboplatin and Taxol. She received 4 cycles but did not tolerated very well and therefore discontinued. He CT scan in January 2015 showed a subcutaneous nodule in the lower abdomen and the inhibin B is a 39 units per mL.  Imaging revealed a 3 cm right inguinal lymph node which was resected on 07/27/2013.   Review of Systems:10 point review of systems is negative except as noted in interval history.   Vitals: Blood pressure 112/68, pulse 76, temperature 98.4 F (36.9 C), temperature source Oral, resp. rate 18, weight 197 lb 3.2 oz (89.449 kg).  Physical Exam: General : The patient is a healthy woman in no acute distress.  HEENT: normocephalic, extraoccular movements normal; neck is supple without thyromegally  Lynphnodes: Supraclavicular and inguinal nodes not enlarged right inguinal incision is well-healed. Abdomen: Soft, non-tender, no ascites, no organomegally, midline incision is well-healed.  Pelvic:  EGBUS: Normal  Vagina: Normal no lesions are noted  Cervix and uterus are surgically absent  Bimanual and rectovaginal exam revealed no masses induration or nodularity.   Lower extremities: No edema or varicosities. Normal range of motion      Allergies  Allergen Reactions  . Prochlorperazine Edisylate Other (See Comments)  Nervous/ flutter/ shakes--compazine  . Adhesive [Tape] Other (See Comments)    Redness/hives from latex tape( tolerates latex gloves); tolerates paper tape    Past Medical History  Diagnosis Date  . Arrhythmia     ventricular tachycardia  . Unspecified systolic heart failure    . Ventricular fibrillation   . ICD (implantable cardiac defibrillator) in place   . Pacemaker   . High cholesterol   . CHF (congestive heart failure)   . Myocardial infarction 2011  . Hypothyroidism   . Arthritis 02/06/2012    "everywhere"  . Bladder cancer 2009    "injected medicine to get rid of it"  . Granulosa cell carcinoma of ovary 2007    right; "had chemo"  . Pneumonia     as child  . Neuropathy     FEET AND HANDS - FROM CHEMO - BUT MUCH IMPROVED  . Port-a-cath in place     RIGHT UPPER CHEST  . Pain     JOINT PAINS AND MUSCLE ACHES ALL OVER.  Marland Kitchen Automatic implantable cardioverter-defibrillator in situ     DR. Beckie Salts   . Coronary artery disease     DR. Linard Millers  . Asthma     as a child    Past Surgical History  Procedure Laterality Date  . Cardiac defibrillator placement  02/06/2012    "lead change"  . Insert / replace / remove pacemaker  10/2009    [place]  . Tonsillectomy and adenoidectomy  ~ 1967  . Appendectomy  1989  . Vaginal hysterectomy  2001  . Tubal ligation  1993  . Bilateral oophorectomy  2007  . Mitral valve replacement  2011    "pig valve"  . Cardiac valve replacement    . Transurethral resection of bladder  2009    "for bladder cancer"  . Thymus removed    . Total hip arthroplasty  05/06/2012    Procedure: TOTAL HIP ARTHROPLASTY;  Surgeon: Gearlean Alf, MD;  Location: [facility];  Service: Orthopedics;  Laterality: Right;  . Laparotomy N/A 01/19/2013    Procedure: TUMOR DEBULKING / BOWEL RESECTION/INSERTION RIGHT UTERERAL DOUBLE J STENT/OMENTECTOMY;  Surgeon: Alvino Chapel, MD;  Location: [facility];  Service: Gynecology;  Laterality: N/A;  . Cystoscopy Right 02/23/2013    Procedure: CYSTOSCOPY WITH STENT REMOVAL;  Surgeon: Alvino Chapel, MD;  Location: [facility];  Service: Gynecology;  Laterality: Right;  . Abdominal hysterectomy N/A 07/27/2013    Procedure: TUMOR EXCISION OF ABDOMINAL MASS;  Surgeon:  Alvino Chapel, MD;  Location: [facility];  Service: Gynecology;  Laterality: N/A;    Current Outpatient Prescriptions  Medication Sig Dispense Refill  . amiodarone (PACERONE) 200 MG tablet Take 100 mg by mouth every morning.       Marland Kitchen aspirin 325 MG EC tablet Take 325 mg by mouth every morning.       Marland Kitchen atorvastatin (LIPITOR) 40 MG tablet Take 40 mg by mouth every evening.      . carvedilol (COREG) 12.5 MG tablet Take 12.5 mg by mouth 2 (two) times daily with a meal.      . cholecalciferol (VITAMIN D) 1000 UNITS tablet Take 1,000 Units by mouth 2 (two) times daily.      . diphenhydrAMINE (BENADRYL) 25 MG tablet Take 25 mg by mouth every 6 (six) hours as needed for itching or allergies.      . Evening Primrose Oil 1000 MG CAPS Take 1,000 mg by mouth 2 (  two) times daily.      . fish oil-omega-3 fatty acids 1000 MG capsule Take 1 g by mouth 2 (two) times daily.      Marland Kitchen HYDROcodone-acetaminophen (NORCO/VICODIN) 5-325 MG per tablet Take 1 tablet by mouth every 8 (eight) hours as needed for moderate pain.  90 tablet  0  . levothyroxine (SYNTHROID, LEVOTHROID) 75 MCG tablet Take 75 mcg by mouth daily before breakfast.      . lidocaine-prilocaine (EMLA) cream Apply 1 application topically as needed. Place a cotton ball size amount to port 45 minutes prior to use and cover with plastic wrap.      . Multiple Vitamins-Minerals (MULTIVITAMIN PO) Take 1 tablet by mouth daily.       Marland Kitchen pyridOXINE (VITAMIN B-6) 50 MG tablet Take 50 mg by mouth 2 (two) times daily.      Marland Kitchen spironolactone (ALDACTONE) 25 MG tablet Take 25 mg by mouth 2 (two) times daily.      Marland Kitchen zolpidem (AMBIEN) 5 MG tablet Take 5 mg by mouth at bedtime.        No current facility-administered medications for this visit.    History   Social History  . Marital Status: Divorced    Spouse Name: N/A    Number of Children: N/A  . Years of Education: N/A   Occupational History  .      WORKS FULL TIME   Social History Main Topics  .  Smoking status: Current Some Day Smoker -- 0.25 packs/day for 30 years    Types: Cigarettes    Start date: 03/28/1974  . Smokeless tobacco: Never Used     Comment: still smoking daily  . Alcohol Use: No  . Drug Use: No  . Sexual Activity: Not Currently   Other Topics Concern  . Not on file   Social History Narrative   WORKS FULL TIME   SINGLE   TOBACCO USE-YES   IMPLANTATION OF DUAL-CHAMBER [employer]DE DEFIBRILLATOR    No family history on file.    Alvino Chapel, MD 09/03/2013, 2:04 PM

## 2013-09-06 ENCOUNTER — Encounter (HOSPITAL_COMMUNITY): Payer: Self-pay | Admitting: Pharmacy Technician

## 2013-09-07 ENCOUNTER — Ambulatory Visit (HOSPITAL_COMMUNITY)
Admission: RE | Admit: 2013-09-07 | Discharge: 2013-09-07 | Disposition: A | Discharge status: Home / Self Care | Payer: BC Managed Care – PPO | Source: Ambulatory Visit | Attending: Hematology & Oncology | Admitting: Hematology & Oncology

## 2013-09-07 ENCOUNTER — Encounter (HOSPITAL_COMMUNITY): Payer: Self-pay

## 2013-09-07 DIAGNOSIS — Z9089 Acquired absence of other organs: Secondary | ICD-10-CM | POA: Insufficient documentation

## 2013-09-07 DIAGNOSIS — I2510 Atherosclerotic heart disease of native coronary artery without angina pectoris: Secondary | ICD-10-CM | POA: Insufficient documentation

## 2013-09-07 DIAGNOSIS — T451X5A Adverse effect of antineoplastic and immunosuppressive drugs, initial encounter: Secondary | ICD-10-CM | POA: Insufficient documentation

## 2013-09-07 DIAGNOSIS — E039 Hypothyroidism, unspecified: Secondary | ICD-10-CM | POA: Insufficient documentation

## 2013-09-07 DIAGNOSIS — I252 Old myocardial infarction: Secondary | ICD-10-CM | POA: Insufficient documentation

## 2013-09-07 DIAGNOSIS — Z8551 Personal history of malignant neoplasm of bladder: Secondary | ICD-10-CM | POA: Insufficient documentation

## 2013-09-07 DIAGNOSIS — Z79899 Other long term (current) drug therapy: Secondary | ICD-10-CM | POA: Insufficient documentation

## 2013-09-07 DIAGNOSIS — Z96649 Presence of unspecified artificial hip joint: Secondary | ICD-10-CM | POA: Insufficient documentation

## 2013-09-07 DIAGNOSIS — F17200 Nicotine dependence, unspecified, uncomplicated: Secondary | ICD-10-CM | POA: Insufficient documentation

## 2013-09-07 DIAGNOSIS — G5690 Unspecified mononeuropathy of unspecified upper limb: Secondary | ICD-10-CM | POA: Insufficient documentation

## 2013-09-07 DIAGNOSIS — Z95810 Presence of automatic (implantable) cardiac defibrillator: Secondary | ICD-10-CM | POA: Insufficient documentation

## 2013-09-07 DIAGNOSIS — Z452 Encounter for adjustment and management of vascular access device: Secondary | ICD-10-CM | POA: Insufficient documentation

## 2013-09-07 DIAGNOSIS — Z7982 Long term (current) use of aspirin: Secondary | ICD-10-CM | POA: Insufficient documentation

## 2013-09-07 DIAGNOSIS — D3910 Neoplasm of uncertain behavior of unspecified ovary: Secondary | ICD-10-CM

## 2013-09-07 DIAGNOSIS — Z90710 Acquired absence of both cervix and uterus: Secondary | ICD-10-CM | POA: Insufficient documentation

## 2013-09-07 DIAGNOSIS — Z8543 Personal history of malignant neoplasm of ovary: Secondary | ICD-10-CM | POA: Insufficient documentation

## 2013-09-07 DIAGNOSIS — E7800 Pure hypercholesterolemia, unspecified: Secondary | ICD-10-CM | POA: Insufficient documentation

## 2013-09-07 DIAGNOSIS — G5790 Unspecified mononeuropathy of unspecified lower limb: Secondary | ICD-10-CM | POA: Insufficient documentation

## 2013-09-07 LAB — CBC
HCT: 37 % (ref 36.0–46.0)
Hemoglobin: 12.7 g/dL (ref 12.0–15.0)
MCH: 32.8 pg (ref 26.0–34.0)
MCHC: 34.3 g/dL (ref 30.0–36.0)
MCV: 95.6 fL (ref 78.0–100.0)
Platelets: 156 10*3/uL (ref 150–400)
RBC: 3.87 MIL/uL (ref 3.87–5.11)
RDW: 12.7 % (ref 11.5–15.5)
WBC: 9.2 10*3/uL (ref 4.0–10.5)

## 2013-09-07 LAB — APTT: aPTT: 29 seconds (ref 24–37)

## 2013-09-07 LAB — PROTIME-INR
INR: 0.93 (ref 0.00–1.49)
Prothrombin Time: 12.3 seconds (ref 11.6–15.2)

## 2013-09-07 MED ORDER — FENTANYL CITRATE 0.05 MG/ML IJ SOLN
INTRAMUSCULAR | Status: AC
  Filled 2013-09-07: qty 6

## 2013-09-07 MED ORDER — MIDAZOLAM HCL 2 MG/2ML IJ SOLN
INTRAMUSCULAR | Status: AC | PRN
  Administered 2013-09-07: 0.5 mg via INTRAVENOUS
  Administered 2013-09-07: 1 mg via INTRAVENOUS
  Administered 2013-09-07: 0.5 mg via INTRAVENOUS

## 2013-09-07 MED ORDER — SODIUM CHLORIDE 0.9 % IV SOLN
INTRAVENOUS | Status: DC
  Administered 2013-09-07: 10 mL/h via INTRAVENOUS

## 2013-09-07 MED ORDER — CEFAZOLIN SODIUM-DEXTROSE 2-3 GM-% IV SOLR
2.0000 g | INTRAVENOUS | Status: AC
  Administered 2013-09-07: 2 g via INTRAVENOUS

## 2013-09-07 MED ORDER — FENTANYL CITRATE 0.05 MG/ML IJ SOLN
INTRAMUSCULAR | Status: AC | PRN
  Administered 2013-09-07: 50 ug via INTRAVENOUS

## 2013-09-07 MED ORDER — CEFAZOLIN SODIUM-DEXTROSE 2-3 GM-% IV SOLR
INTRAVENOUS | Status: AC
  Administered 2013-09-07: 2 g via INTRAVENOUS
  Filled 2013-09-07: qty 50

## 2013-09-07 MED ORDER — LIDOCAINE-EPINEPHRINE (PF) 2 %-1:200000 IJ SOLN
INTRAMUSCULAR | Status: AC
  Filled 2013-09-07: qty 20

## 2013-09-07 MED ORDER — MIDAZOLAM HCL 2 MG/2ML IJ SOLN
INTRAMUSCULAR | Status: AC
  Filled 2013-09-07: qty 6

## 2013-09-07 NOTE — Procedures (Signed)
Successful removal of right anterior chest wall port-a-cath. No immediate post procedural complications.  

## 2013-09-07 NOTE — Discharge Instructions (Signed)
Moderate Sedation, Adult °Moderate sedation is given to help you relax or even sleep through a procedure. You may remain sleepy, be clumsy, or have poor balance for several hours following this procedure. Arrange for a responsible adult, family member, or friend to take you home. A responsible adult should stay with you for at least 24 hours or until the medicines have worn off. °· Do not participate in any activities where you could become injured for the next 24 hours, or until you feel normal again. Do not: °· Drive. °· Swim. °· Ride a bicycle. °· Operate heavy machinery. °· Cook. °· Use power tools. °· Climb ladders. °· Work at heights. °· Do not make important decisions or sign legal documents until you are improved. °· Vomiting may occur if you eat too soon. When you can drink without vomiting, try water, juice, or soup. Try solid foods if you feel little or no nausea. °· Only take over-the-counter or prescription medications for pain, discomfort, or fever as directed by your caregiver.If pain medications have been prescribed for you, ask your caregiver how soon it is safe to take them. °· Make sure you and your family fully understands everything about the medication given to you. Make sure you understand what side effects may occur. °· You should not drink alcohol, take sleeping pills, or medications that cause drowsiness for at least 24 hours. °· If you smoke, do not smoke alone. °· If you are feeling better, you may resume normal activities 24 hours after receiving sedation. °· Keep all appointments as scheduled. Follow all instructions. °· Ask questions if you do not understand. °SEEK MEDICAL CARE IF:  °· Your skin is pale or bluish in color. °· You continue to feel sick to your stomach (nauseous) or throw up (vomit). °· Your pain is getting worse and not helped by medication. °· You have bleeding or swelling. °· You are still sleepy or feeling clumsy after 24 hours. °SEEK IMMEDIATE MEDICAL CARE IF:   °· You develop a rash. °· You have difficulty breathing. °· You develop any type of allergic problem. °· You have a fever. °Document Released: 03/05/2001 Document Revised: 09/02/2011 Document Reviewed: 02/15/2013 °ExitCare® Patient Information ©2014 [employer]. °Incision Care °An incision is when a surgeon cuts into your body tissues. After surgery, the incision needs to be cared for properly to prevent infection.  °HOME CARE INSTRUCTIONS  °· Take all medicine as directed by your caregiver. Only take over-the-counter or prescription medicines for pain, discomfort, or fever as directed by your caregiver. °· Do not remove your bandage (dressing) or get your incision wet until your surgeon gives you permission. In the event that your dressing becomes wet, dirty, or starts to smell, change the dressing and call your surgeon for instructions as soon as possible. °· Take showers. Do not take tub baths, swim, or do anything that may soak the wound until it is healed. °· Resume your normal diet and activities as directed or allowed. °· Avoid lifting any weight until you are instructed otherwise. °· Use anti-itch antihistamine medicine as directed by your caregiver. The wound may itch when it is healing. Do not pick or scratch at the wound. °· Follow up with your caregiver for stitch (suture) or staple removal as directed. °· Drink enough fluids to keep your urine clear or pale yellow. °SEEK MEDICAL CARE IF:  °· You have redness, swelling, or increasing pain in the wound that is not controlled with medicine. °· You have drainage,   blood, or pus coming from the wound that lasts longer than 1 day. °· You develop muscle aches, chills, or a general ill feeling. °· You notice a bad smell coming from the wound or dressing. °· Your wound edges separate after the sutures, staples, or skin adhesive strips have been removed. °· You develop persistent nausea or vomiting. °SEEK IMMEDIATE MEDICAL CARE IF:  °· You have a fever. °· You  develop a rash. °· You develop dizzy episodes or faint while standing. °· You have difficulty breathing. °· You develop any reaction or side effects to medicine given. °MAKE SURE YOU:  °· Understand these instructions. °· Will watch your condition. °· Will get help right away if you are not doing well or get worse. °Document Released: 12/28/2004 Document Revised: 09/02/2011 Document Reviewed: 10/14/2010 °ExitCare® Patient Information ©2014 [employer]. ° ° °

## 2013-09-07 NOTE — H&P (Signed)
[name] is an 53 y.o. female.   Chief Complaint: "I'm here to get my port out" HPI: Patient with prior history of malignant granulosa cell tumor of ovary, s/p completion of therapy, presents today for port a cath removal.  Past Medical History  Diagnosis Date  . Arrhythmia     ventricular tachycardia  . Unspecified systolic heart failure   . Ventricular fibrillation   . ICD (implantable cardiac defibrillator) in place   . Pacemaker   . High cholesterol   . CHF (congestive heart failure)   . Myocardial infarction 2011  . Hypothyroidism   . Arthritis 02/06/2012    "everywhere"  . Bladder cancer 2009    "injected medicine to get rid of it"  . Granulosa cell carcinoma of ovary 2007    right; "had chemo"  . Pneumonia     as child  . Neuropathy     FEET AND HANDS - FROM CHEMO - BUT MUCH IMPROVED  . Port-a-cath in place     RIGHT UPPER CHEST  . Pain     JOINT PAINS AND MUSCLE ACHES ALL OVER.  Marland Kitchen Automatic implantable cardioverter-defibrillator in situ     DR. Beckie Salts   . Coronary artery disease     DR. Linard Millers  . Asthma     as a child    Past Surgical History  Procedure Laterality Date  . Cardiac defibrillator placement  02/06/2012    "lead change"  . Insert / replace / remove pacemaker  10/2009    [place]  . Tonsillectomy and adenoidectomy  ~ 1967  . Appendectomy  1989  . Vaginal hysterectomy  2001  . Tubal ligation  1993  . Bilateral oophorectomy  2007  . Mitral valve replacement  2011    "pig valve"  . Cardiac valve replacement    . Transurethral resection of bladder  2009    "for bladder cancer"  . Thymus removed    . Total hip arthroplasty  05/06/2012    Procedure: TOTAL HIP ARTHROPLASTY;  Surgeon: Gearlean Alf, MD;  Location: [facility];  Service: Orthopedics;  Laterality: Right;  . Laparotomy N/A 01/19/2013    Procedure: TUMOR DEBULKING / BOWEL RESECTION/INSERTION RIGHT UTERERAL DOUBLE J STENT/OMENTECTOMY;  Surgeon: Alvino Chapel, MD;  Location: [facility];  Service: Gynecology;  Laterality: N/A;  . Cystoscopy Right 02/23/2013    Procedure: CYSTOSCOPY WITH STENT REMOVAL;  Surgeon: Alvino Chapel, MD;  Location: [facility];  Service: Gynecology;  Laterality: Right;  . Abdominal hysterectomy N/A 07/27/2013    Procedure: TUMOR EXCISION OF ABDOMINAL MASS;  Surgeon: Alvino Chapel, MD;  Location: [facility];  Service: Gynecology;  Laterality: N/A;    History reviewed. No pertinent family history. Social History:  reports that she has been smoking Cigarettes.  She started smoking about 39 years ago. She has a 7.5 pack-year smoking history. She has never used smokeless tobacco. She reports that she does not drink alcohol or use illicit drugs.  Allergies:  Allergies  Allergen Reactions  . Prochlorperazine Edisylate Other (See Comments)    Nervous/ flutter/ shakes--compazine  . Adhesive [Tape] Other (See Comments)    Redness/hives from latex tape( tolerates latex gloves); tolerates paper tape    Current outpatient prescriptions:amiodarone (PACERONE) 200 MG tablet, Take 100 mg by mouth every morning. , Disp: , Rfl: ;  aspirin 325 MG EC tablet, Take 325 mg by mouth every morning. , Disp: , Rfl: ;  atorvastatin (LIPITOR)  40 MG tablet, Take 40 mg by mouth every evening., Disp: , Rfl: ;  carvedilol (COREG) 12.5 MG tablet, Take 12.5 mg by mouth 2 (two) times daily with a meal., Disp: , Rfl:  cholecalciferol (VITAMIN D) 1000 UNITS tablet, Take 1,000 Units by mouth 2 (two) times daily., Disp: , Rfl: ;  diphenhydrAMINE (BENADRYL) 25 MG tablet, Take 25 mg by mouth every 6 (six) hours as needed for itching or allergies., Disp: , Rfl: ;  Evening Primrose Oil 1000 MG CAPS, Take 1,000 mg by mouth 2 (two) times daily., Disp: , Rfl: ;  fish oil-omega-3 fatty acids 1000 MG capsule, Take 1 g by mouth 2 (two) times daily., Disp: , Rfl:  HYDROcodone-acetaminophen (NORCO/VICODIN) 5-325 MG per tablet, Take 1 tablet by mouth every 8  (eight) hours as needed for moderate pain., Disp: 90 tablet, Rfl: 0;  levothyroxine (SYNTHROID, LEVOTHROID) 75 MCG tablet, Take 75 mcg by mouth daily before breakfast., Disp: , Rfl:  lidocaine-prilocaine (EMLA) cream, Apply 1 application topically as needed. Place a cotton ball size amount to port 45 minutes prior to use and cover with plastic wrap., Disp: , Rfl: ;  Multiple Vitamins-Minerals (MULTIVITAMIN PO), Take 1 tablet by mouth daily. , Disp: , Rfl: ;  pyridOXINE (VITAMIN B-6) 50 MG tablet, Take 50 mg by mouth 2 (two) times daily., Disp: , Rfl:  spironolactone (ALDACTONE) 25 MG tablet, Take 25 mg by mouth 2 (two) times daily., Disp: , Rfl: ;  zolpidem (AMBIEN) 5 MG tablet, Take 5 mg by mouth at bedtime. , Disp: , Rfl:  Current facility-administered medications:0.9 %  sodium chloride infusion, , Intravenous, Continuous, D Rowe Robert, PA-C, Last Rate: 10 mL/hr at 09/07/13 0755, 10 mL/hr at 09/07/13 0755;  ceFAZolin (ANCEF) IVPB 2 g/50 mL premix, 2 g, Intravenous, On Call, D Rowe Robert, PA-C   Results for orders placed during the hospital encounter of 09/07/13 (from the past 48 hour(s))  APTT     Status: None   Collection Time    09/07/13  7:54 AM      Result Value Ref Range   aPTT 29  24 - 37 seconds  CBC     Status: None   Collection Time    09/07/13  7:54 AM      Result Value Ref Range   WBC 9.2  4.0 - 10.5 K/uL   RBC 3.87  3.87 - 5.11 MIL/uL   Hemoglobin 12.7  12.0 - 15.0 g/dL   HCT 37.0  36.0 - 46.0 %   MCV 95.6  78.0 - 100.0 fL   MCH 32.8  26.0 - 34.0 pg   MCHC 34.3  30.0 - 36.0 g/dL   RDW 12.7  11.5 - 15.5 %   Platelets 156  150 - 400 K/uL  PROTIME-INR     Status: None   Collection Time    09/07/13  7:54 AM      Result Value Ref Range   Prothrombin Time 12.3  11.6 - 15.2 seconds   INR 0.93  0.00 - 1.49   No results found.  Review of Systems  Constitutional: Negative for fever and chills.  Respiratory: Negative for cough.   Cardiovascular: Negative for chest pain.   Gastrointestinal: Negative for nausea, vomiting and abdominal pain.  Neurological: Negative for headaches.  Endo/Heme/Allergies: Does not bruise/bleed easily.    Blood pressure 100/69, pulse 77, temperature 97.8 F (36.6 C), temperature source Oral, resp. rate 18, SpO2 98.00%. Physical Exam  Constitutional: She is oriented to  person, place, and time. She appears well-developed and well-nourished.  Cardiovascular: Normal rate and regular rhythm.   Respiratory: Effort normal and breath sounds normal.  Clean, intact rt chest wall port a cath; left sided pacemaker  GI: Soft. Bowel sounds are normal. There is no tenderness.  Musculoskeletal: Normal range of motion. She exhibits no edema.  Neurological: She is oriented to person, place, and time.     Assessment/Plan Patient with prior history of malignant granulosa cell tumor of ovary, s/p completion of therapy, presents today for port a cath removal. Details/risks of procedure d/w pt with her understanding and consent.  Monseratt Ledin,D KEVIN 09/07/2013, 9:03 AM

## 2013-09-13 LAB — ANTI MULLERIAN HORMONE

## 2013-09-13 NOTE — Progress Notes (Signed)
Hematology and Oncology Follow Up Visit  [name] [id number] 04-10-1961 53 y.o. 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 is back for followup. She did undergo surgery. This was done back in February. This did show recurrent so they granulosa cell tumor.  She feels well. She got to surgery nicely. She's had no complications. There's been no nausea vomiting. She's had no palpable her bowels or bladder. She's had no cough.  We are following her inhibin B. and anti-mllerian hormone.  There's been no fever. She's had no bleeding. Medications: Current outpatient prescriptions:amiodarone (PACERONE) 200 MG tablet, Take 100 mg by mouth every morning. , Disp: , Rfl: ;  aspirin 325 MG EC tablet, Take 325 mg by mouth every morning. , Disp: , Rfl: ;  atorvastatin (LIPITOR) 40 MG tablet, Take 40 mg by mouth every evening., Disp: , Rfl: ;  carvedilol (COREG) 12.5 MG tablet, Take 12.5 mg by mouth 2 (two) times daily with a meal., Disp: , Rfl:  cholecalciferol (VITAMIN D) 1000 UNITS tablet, Take 1,000 Units by mouth 2 (two) times daily., Disp: , Rfl: ;  diphenhydrAMINE (BENADRYL) 25 MG tablet, Take 25 mg by mouth every 6 (six) hours as needed for itching or allergies., Disp: , Rfl: ;  Evening Primrose Oil 1000 MG CAPS, Take 1,000 mg by mouth 2 (two) times daily., Disp: , Rfl: ;  fish oil-omega-3 fatty acids 1000 MG capsule, Take 1 g by mouth 2 (two) times daily., Disp: , Rfl:  levothyroxine (SYNTHROID, LEVOTHROID) 75 MCG tablet, Take 75 mcg by mouth daily before breakfast., Disp: , Rfl: ;  lidocaine-prilocaine (EMLA) cream, Apply 1 application topically as needed. Place a cotton ball size amount to port 45 minutes prior to use and cover with plastic wrap., Disp: , Rfl: ;  Multiple Vitamins-Minerals (MULTIVITAMIN PO), Take 1 tablet by mouth daily. , Disp: , Rfl:  pyridOXINE (VITAMIN B-6) 50 MG  tablet, Take 50 mg by mouth 2 (two) times daily., Disp: , Rfl: ;  spironolactone (ALDACTONE) 25 MG tablet, Take 25 mg by mouth 2 (two) times daily., Disp: , Rfl: ;  zolpidem (AMBIEN) 5 MG tablet, Take 5 mg by mouth at bedtime. , Disp: , Rfl: ;  HYDROcodone-acetaminophen (NORCO/VICODIN) 5-325 MG per tablet, Take 1 tablet by mouth every 8 (eight) hours as needed for moderate pain., Disp: 90 tablet, Rfl: 0  Allergies:  Allergies  Allergen Reactions  . Prochlorperazine Edisylate Other (See Comments)    Nervous/ flutter/ shakes--compazine  . Adhesive [Tape] Other (See Comments)    Redness/hives from latex tape( tolerates latex gloves); tolerates paper tape    Past Medical History, Surgical history, Social history, and Family History were reviewed and updated.  Review of Systems: As above  Physical Exam:  height is 5\' 6"  (1.676 m) and weight is 194 lb (87.998 kg). Her oral temperature is 98.1 F (36.7 C). Her blood pressure is 97/57 and her pulse is 76. Her respiration is 14.   Well-developed well-nourished white female. Lungs are clear. Cardiac exam regular in rhythm. Abdomen soft. Well-healed laparotomy scar. Some slight tenderness in the hypoumbilical region. No mass. No fluid. No palpable liver or spleen. Extremities no clubbing cyanosis or edema. Strength in her legs. Skin exam no rashes.  Lab Results  Component Value Date   WBC 9.2 09/07/2013   HGB 12.7 09/07/2013   HCT 37.0 09/07/2013  MCV 95.6 09/07/2013   PLT 156 09/07/2013     Chemistry      Component Value Date/Time   NA 140 08/26/2013 1103   NA 138 07/23/2013 1150   NA 138 01/29/2013 1114   K 4.2 08/26/2013 1103   K 4.2 07/23/2013 1150   K 4.3 01/29/2013 1114   CL 100 08/26/2013 1103   CL 101 07/23/2013 1150   CO2 29 08/26/2013 1103   CO2 23 07/23/2013 1150   CO2 23 01/29/2013 1114   BUN 14 08/26/2013 1103   BUN 16 07/23/2013 1150   BUN 9.5 01/29/2013 1114   CREATININE 1.5* 08/26/2013 1103   CREATININE 1.39* 07/23/2013 1150   CREATININE 1.3*  01/29/2013 1114      Component Value Date/Time   CALCIUM 10.6* 08/26/2013 1103   CALCIUM 10.0 07/23/2013 1150   CALCIUM 9.0 01/29/2013 1114   ALKPHOS 52 08/26/2013 1103   ALKPHOS 62 07/23/2013 1150   AST 31 08/26/2013 1103   AST 18 07/23/2013 1150   ALT 20 08/26/2013 1103   ALT 17 07/23/2013 1150   BILITOT 0.50 08/26/2013 1103   BILITOT <0.2* 07/23/2013 1150         Impression and Plan: [name] is 53 year old white female. She had a recurrence of her granulosa cell tumor back in 2014. She had this resected. With a day [place] chemotherapy. Despite dosage reduction, she had a hard time with this. We stopped after 5 cycles.  She there was noted to have an increase in her inhibin B. level. Skin showed a omental nodule. This was biopsied. This was ultimately resected.  We're following her inhibin B. level. It was 18 today.  She wants her Port-A-Cath. We'll go ahead and get this taken out.  We'll plan to get her back to see [place] in another 2 or 3 months.   Volanda Napoleon, MD 3/23/20156:47 AM

## 2013-09-15 ENCOUNTER — Ambulatory Visit (INDEPENDENT_AMBULATORY_CARE_PROVIDER_SITE_OTHER): Payer: BC Managed Care – PPO | Admitting: *Deleted

## 2013-09-15 DIAGNOSIS — I4729 Other ventricular tachycardia: Secondary | ICD-10-CM

## 2013-09-15 DIAGNOSIS — I5020 Unspecified systolic (congestive) heart failure: Secondary | ICD-10-CM

## 2013-09-15 DIAGNOSIS — I472 Ventricular tachycardia: Secondary | ICD-10-CM

## 2013-09-15 DIAGNOSIS — I4901 Ventricular fibrillation: Secondary | ICD-10-CM

## 2013-09-15 LAB — MDC_IDC_ENUM_SESS_TYPE_REMOTE
Battery Remaining Longevity: 52 mo
Battery Voltage: 2.93 V
Brady Statistic AP VS Percent: 1 %
Brady Statistic AS VS Percent: 99 %
Date Time Interrogation Session: 20150325103056
HIGH POWER IMPEDANCE MEASURED VALUE: 60 Ohm
HighPow Impedance: 60 Ohm
Implantable Pulse Generator Serial Number: 778164
Lead Channel Impedance Value: 1425 Ohm
Lead Channel Pacing Threshold Amplitude: 0.5 V
Lead Channel Pacing Threshold Pulse Width: 0.5 ms
Lead Channel Setting Pacing Amplitude: 5 V
Lead Channel Setting Pacing Pulse Width: 1.3 ms
Lead Channel Setting Sensing Sensitivity: 0.5 mV
MDC IDC MSMT BATTERY REMAINING PERCENTAGE: 62 %
MDC IDC MSMT LEADCHNL RA SENSING INTR AMPL: 2.8 mV
MDC IDC MSMT LEADCHNL RV IMPEDANCE VALUE: 410 Ohm
MDC IDC MSMT LEADCHNL RV PACING THRESHOLD AMPLITUDE: 2.5 V
MDC IDC MSMT LEADCHNL RV PACING THRESHOLD PULSEWIDTH: 1.5 ms
MDC IDC MSMT LEADCHNL RV SENSING INTR AMPL: 12 mV
MDC IDC SET LEADCHNL RA PACING AMPLITUDE: 2 V
MDC IDC SET ZONE DETECTION INTERVAL: 260 ms
MDC IDC SET ZONE DETECTION INTERVAL: 315 ms
MDC IDC SET ZONE DETECTION INTERVAL: 400 ms
MDC IDC STAT BRADY AP VP PERCENT: 0 %
MDC IDC STAT BRADY AS VP PERCENT: 1 %
MDC IDC STAT BRADY RA PERCENT PACED: 1 %
MDC IDC STAT BRADY RV PERCENT PACED: 1 %

## 2013-10-06 ENCOUNTER — Telehealth: Payer: Self-pay | Admitting: Hematology & Oncology

## 2013-10-06 NOTE — Telephone Encounter (Signed)
Pt called cx 5-6 CT said Dr. Fermin Schwab told her to cx until after she see's Dr. Marin Olp on 5-6. RN aware to let Dr. Marin Olp know

## 2013-10-14 ENCOUNTER — Other Ambulatory Visit: Payer: Self-pay | Admitting: Hematology & Oncology

## 2013-10-19 ENCOUNTER — Encounter: Payer: Self-pay | Admitting: Internal Medicine

## 2013-10-27 ENCOUNTER — Ambulatory Visit (HOSPITAL_BASED_OUTPATIENT_CLINIC_OR_DEPARTMENT_OTHER): Payer: BC Managed Care – PPO

## 2013-10-27 ENCOUNTER — Ambulatory Visit (HOSPITAL_BASED_OUTPATIENT_CLINIC_OR_DEPARTMENT_OTHER): Payer: BC Managed Care – PPO | Admitting: Hematology & Oncology

## 2013-10-27 ENCOUNTER — Encounter: Payer: Self-pay | Admitting: Hematology & Oncology

## 2013-10-27 ENCOUNTER — Other Ambulatory Visit (HOSPITAL_BASED_OUTPATIENT_CLINIC_OR_DEPARTMENT_OTHER): Payer: BC Managed Care – PPO | Admitting: Lab

## 2013-10-27 VITALS — BP 100/57 | HR 53 | Temp 97.9°F | Resp 14 | Ht 66.0 in | Wt 205.0 lb

## 2013-10-27 DIAGNOSIS — D3910 Neoplasm of uncertain behavior of unspecified ovary: Secondary | ICD-10-CM

## 2013-10-27 LAB — CBC WITH DIFFERENTIAL (CANCER CENTER ONLY)
BASO#: 0 10*3/uL (ref 0.0–0.2)
BASO%: 0.3 % (ref 0.0–2.0)
EOS%: 1.7 % (ref 0.0–7.0)
Eosinophils Absolute: 0.2 10*3/uL (ref 0.0–0.5)
HCT: 36.5 % (ref 34.8–46.6)
HGB: 12.5 g/dL (ref 11.6–15.9)
LYMPH#: 3.5 10*3/uL — ABNORMAL HIGH (ref 0.9–3.3)
LYMPH%: 31 % (ref 14.0–48.0)
MCH: 32.9 pg (ref 26.0–34.0)
MCHC: 34.2 g/dL (ref 32.0–36.0)
MCV: 96 fL (ref 81–101)
MONO#: 1 10*3/uL — ABNORMAL HIGH (ref 0.1–0.9)
MONO%: 8.9 % (ref 0.0–13.0)
NEUT#: 6.6 10*3/uL — ABNORMAL HIGH (ref 1.5–6.5)
NEUT%: 58.1 % (ref 39.6–80.0)
Platelets: 149 10*3/uL (ref 145–400)
RBC: 3.8 10*6/uL (ref 3.70–5.32)
RDW: 13.4 % (ref 11.1–15.7)
WBC: 11.3 10*3/uL — ABNORMAL HIGH (ref 3.9–10.0)

## 2013-10-27 NOTE — Patient Instructions (Signed)
You Can Quit Smoking If you are ready to quit smoking or are thinking about it, congratulations! You have chosen to help yourself be healthier and live longer! There are lots of different ways to quit smoking. Nicotine gum, nicotine patches, a nicotine inhaler, or nicotine nasal spray can help with physical craving. Hypnosis, support groups, and medicines help break the habit of smoking. TIPS TO GET OFF AND STAY OFF CIGARETTES  Learn to predict your moods. Do not let a bad situation be your excuse to have a cigarette. Some situations in your life might tempt you to have a cigarette.  Ask friends and co-workers not to smoke around you.  Make your home smoke-free.  Never have "just one" cigarette. It leads to wanting another and another. Remind yourself of your decision to quit.  On a card, make a list of your reasons for not smoking. Read it at least the same number of times a day as you have a cigarette. Tell yourself everyday, "I do not want to smoke. I choose not to smoke."  Ask someone at home or work to help you with your plan to quit smoking.  Have something planned after you eat or have a cup of coffee. Take a walk or get other exercise to perk you up. This will help to keep you from overeating.  Try a relaxation exercise to calm you down and decrease your stress. Remember, you may be tense and nervous the first two weeks after you quit. This will pass.  Find new activities to keep your hands busy. Play with a pen, coin, or rubber band. Doodle or draw things on paper.  Brush your teeth right after eating. This will help cut down the craving for the taste of tobacco after meals. You can try mouthwash too.  Try gum, breath mints, or diet candy to keep something in your mouth. IF YOU SMOKE AND WANT TO QUIT:  Do not stock up on cigarettes. Never buy a carton. Wait until one pack is finished before you buy another.  Never carry cigarettes with you at work or at home.  Keep cigarettes  as far away from you as possible. Leave them with someone else.  Never carry matches or a lighter with you.  Ask yourself, "Do I need this cigarette or is this just a reflex?"  Bet with someone that you can quit. Put cigarette money in a piggy bank every morning. If you smoke, you give up the money. If you do not smoke, by the end of the week, you keep the money.  Keep trying. It takes 21 days to change a habit!  Talk to your doctor about using medicines to help you quit. These include nicotine replacement gum, lozenges, or skin patches. Document Released: 04/06/2009 Document Revised: 09/02/2011 Document Reviewed: 04/06/2009 ExitCare Patient Information 2014 [employer].  

## 2013-10-27 NOTE — Progress Notes (Signed)
Hematology and Oncology Follow Up Visit  [name] [id number] [birth year] 53 y.o. 10/27/2013   Principle Diagnosis:   Recurrent granulosa cell tumor of the ovary  Current Therapy:    Observation     Interim History:  [name] is back for followup. Last saw her back in early March. She's been doing pretty well. We are following her hormone levels. Her anti-mllerian hormone was less than 0.03. Her inhibin B. was 18.  She's had no abdominal pain. She's had no cough shortness of breath. She's had no change in bowel or bladder habits. His been no leg swelling. Patient does have a pacemaker in. She is on amiodarone.  She's had no rashes. She's had no headache.  Medications: Current outpatient prescriptions:amiodarone (PACERONE) 200 MG tablet, Take 100 mg by mouth every morning. , Disp: , Rfl: ;  aspirin 325 MG EC tablet, Take 325 mg by mouth every morning. , Disp: , Rfl: ;  atorvastatin (LIPITOR) 40 MG tablet, Take 40 mg by mouth every evening., Disp: , Rfl: ;  carvedilol (COREG) 12.5 MG tablet, Take 12.5 mg by mouth 2 (two) times daily with a meal., Disp: , Rfl:  cholecalciferol (VITAMIN D) 1000 UNITS tablet, Take 1,000 Units by mouth 2 (two) times daily., Disp: , Rfl: ;  diphenhydrAMINE (BENADRYL) 25 MG tablet, Take 25 mg by mouth as needed for itching or allergies. ALLERGIES, Disp: , Rfl: ;  Evening Primrose Oil 1000 MG CAPS, Take 1,000 mg by mouth 2 (two) times daily., Disp: , Rfl: ;  fish oil-omega-3 fatty acids 1000 MG capsule, Take 1 g by mouth 2 (two) times daily., Disp: , Rfl:  HYDROcodone-acetaminophen (NORCO/VICODIN) 5-325 MG per tablet, Take 1 tablet by mouth every 8 (eight) hours as needed for moderate pain., Disp: 90 tablet, Rfl: 0;  levothyroxine (SYNTHROID, LEVOTHROID) 75 MCG tablet, Take 75 mcg by mouth daily before breakfast., Disp: , Rfl:  lidocaine-prilocaine (EMLA) cream, Apply 1 application topically as needed. Place a cotton ball size amount to port 45 minutes prior to  use and cover with plastic wrap., Disp: , Rfl: ;  Multiple Vitamins-Minerals (MULTIVITAMIN PO), Take 1 tablet by mouth daily. , Disp: , Rfl: ;  pyridOXINE (VITAMIN B-6) 50 MG tablet, Take 50 mg by mouth 2 (two) times daily., Disp: , Rfl:  spironolactone (ALDACTONE) 25 MG tablet, Take 25 mg by mouth 2 (two) times daily., Disp: , Rfl: ;  zolpidem (AMBIEN) 5 MG tablet, TAKE 1 TABLET BY MOUTH AT BEDTIME AS NEEDED SLEEP, Disp: 30 tablet, Rfl: 2  Allergies:  Allergies  Allergen Reactions  . Prochlorperazine Edisylate Other (See Comments)    Nervous/ flutter/ shakes--compazine  . Adhesive [Tape] Other (See Comments)    Redness/hives from latex tape( tolerates latex gloves); tolerates paper tape    Past Medical History, Surgical history, Social history, and Family History were reviewed and updated.  Review of Systems: As above  Physical Exam:  height is 5\' 6"  (1.676 m) and weight is 205 lb (92.987 kg). Her oral temperature is 97.9 F (36.6 C). Her blood pressure is 100/57 and her pulse is 53. Her respiration is 14.   Well-developed well-nourished white female. Lungs are clear. Head and neck exam shows no ocular or oral lesion. There is no adenopathy. Lungs are clear. Cardiac exam regular rate rhythm. She is a 1/6 systolic murmur. Abdomen soft presumably a laparotomy scars. She has no fluid wave. No palpable liver or spleen. There is no obvious abdominal mass. Negative no tenderness  over the spine ribs or hips. Extremities shows no clubbing cyanosis or edema. Skin exam no rashes. Muscular exam shows good resolution of her joints. Physical muscle strength. Neurological exam is nonfocal.  Lab Results  Component Value Date   WBC 11.3* 10/27/2013   HGB 12.5 10/27/2013   HCT 36.5 10/27/2013   MCV 96 10/27/2013   PLT 149 10/27/2013     Chemistry      Component Value Date/Time   NA 140 08/26/2013 1103   NA 138 07/23/2013 1150   NA 138 01/29/2013 1114   K 4.2 08/26/2013 1103   K 4.2 07/23/2013 1150   K 4.3  01/29/2013 1114   CL 100 08/26/2013 1103   CL 101 07/23/2013 1150   CO2 29 08/26/2013 1103   CO2 23 07/23/2013 1150   CO2 23 01/29/2013 1114   BUN 14 08/26/2013 1103   BUN 16 07/23/2013 1150   BUN 9.5 01/29/2013 1114   CREATININE 1.5* 08/26/2013 1103   CREATININE 1.39* 07/23/2013 1150   CREATININE 1.3* 01/29/2013 1114      Component Value Date/Time   CALCIUM 10.6* 08/26/2013 1103   CALCIUM 10.0 07/23/2013 1150   CALCIUM 9.0 01/29/2013 1114   ALKPHOS 52 08/26/2013 1103   ALKPHOS 62 07/23/2013 1150   AST 31 08/26/2013 1103   AST 18 07/23/2013 1150   ALT 20 08/26/2013 1103   ALT 17 07/23/2013 1150   BILITOT 0.50 08/26/2013 1103   BILITOT <0.2* 07/23/2013 1150         Impression and Plan: [name] is 53 year old white female with a recurrent granulosa cell tumor. She underwent debulking. She did have adjuvant chemotherapy. She subsequently had another recurrence. She had this resected.  So far, everything looks good.  Her Port-A-Cath was taken out.  Will plan to get her back now in about 4 months. I think this would be reasonable.   Volanda Napoleon, MD 5/6/201512:27 PM

## 2013-10-30 LAB — COMPREHENSIVE METABOLIC PANEL
ALK PHOS: 56 U/L (ref 39–117)
ALT: 17 U/L (ref 0–35)
AST: 19 U/L (ref 0–37)
Albumin: 4.1 g/dL (ref 3.5–5.2)
BUN: 19 mg/dL (ref 6–23)
CO2: 25 meq/L (ref 19–32)
Calcium: 9.6 mg/dL (ref 8.4–10.5)
Chloride: 101 mEq/L (ref 96–112)
Creatinine, Ser: 1.5 mg/dL — ABNORMAL HIGH (ref 0.50–1.10)
Glucose, Bld: 83 mg/dL (ref 70–99)
POTASSIUM: 3.9 meq/L (ref 3.5–5.3)
Sodium: 135 mEq/L (ref 135–145)
Total Bilirubin: 0.4 mg/dL (ref 0.2–1.2)
Total Protein: 6.3 g/dL (ref 6.0–8.3)

## 2013-10-30 LAB — INHIBIN B: INHIBIN B: 13 pg/mL

## 2013-10-30 LAB — LACTATE DEHYDROGENASE: LDH: 138 U/L (ref 94–250)

## 2013-11-03 ENCOUNTER — Telehealth: Payer: Self-pay | Admitting: *Deleted

## 2013-11-03 IMAGING — RF DG CYSTOGRAM 3+V
8 series · 8 of 8 positions shown · non-contrast
Comparison: None.

CLINICAL DATA: Radical debulking of recurrent ovarian cancer with
resection of right distal ureter

CYSTOGRAM
TECHNIQUE: After catheterization of the urinary bladder following
sterile technique the bladder was filled with 200 mL Cysto-Hypaque
30% by drip infusion.  Serial spot images were obtained during
bladder filling and post draining.
Fluoroscopy Time: 37 seconds

[Series 1: run · 1 of 1 slices shown (1 of 7)]
[im 1/1]
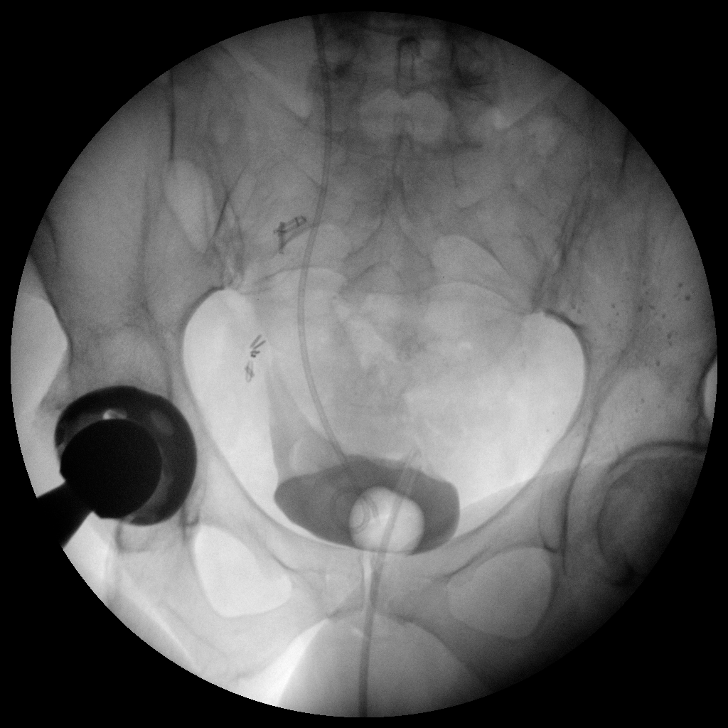

[Series 2: run · 1 of 1 slices shown (2 of 7)]
[im 1/1]
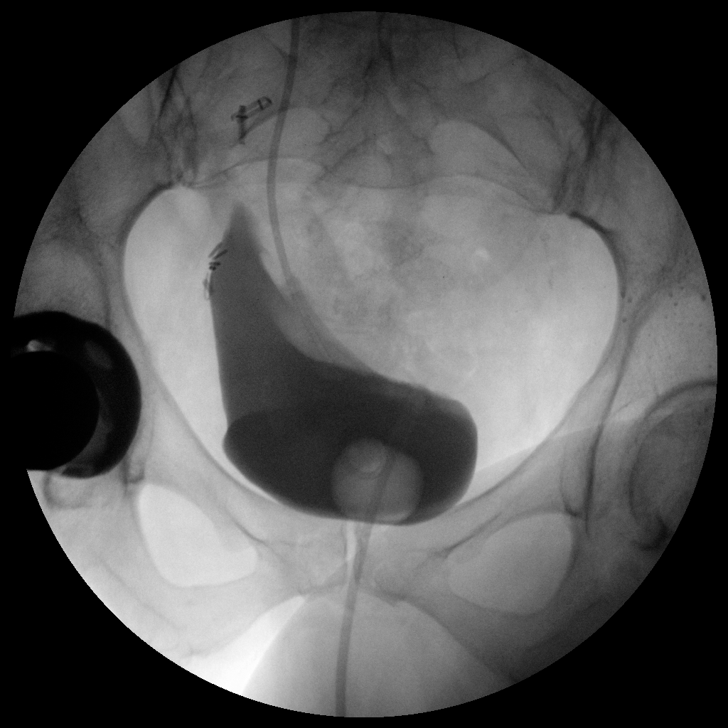

[Series 3: run · 1 of 1 slices shown (3 of 7)]
[im 1/1]
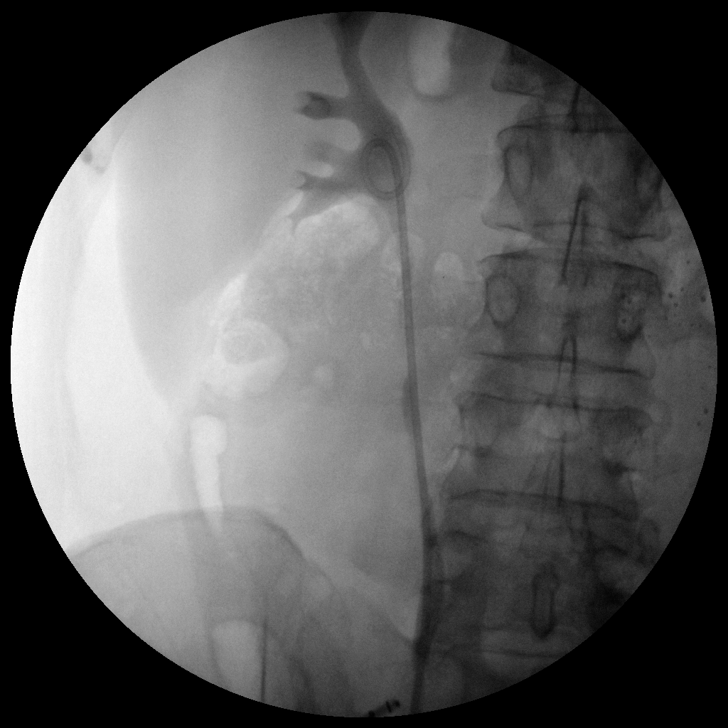

[Series 4: run · 1 of 1 slices shown (4 of 7)]
[im 1/1]
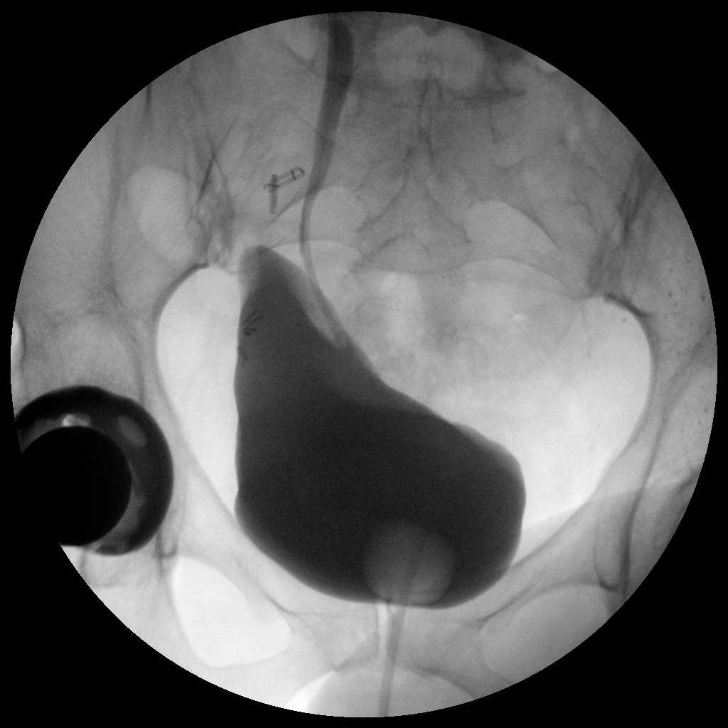

[Series 5: run · 1 of 1 slices shown (5 of 7)]
[im 1/1]
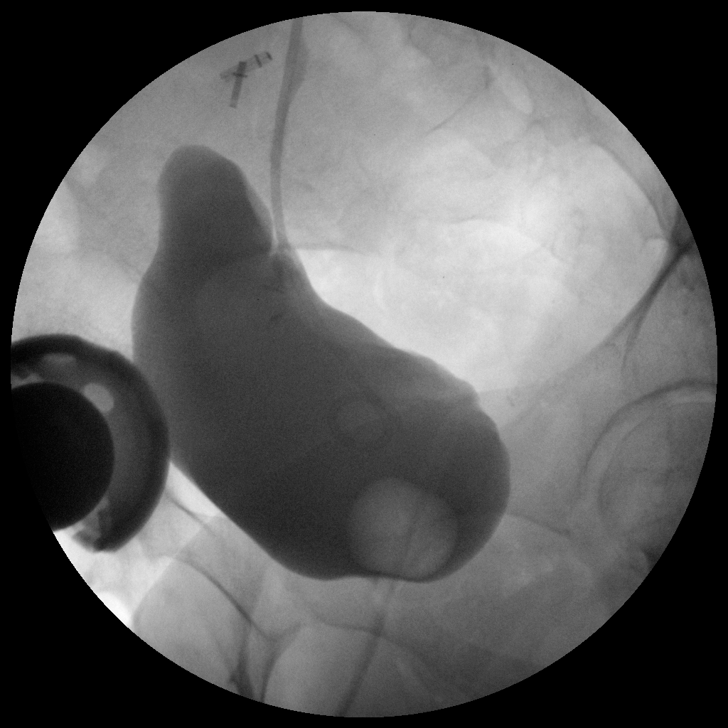

[Series 6: run · 1 of 1 slices shown (6 of 7)]
[im 1/1]
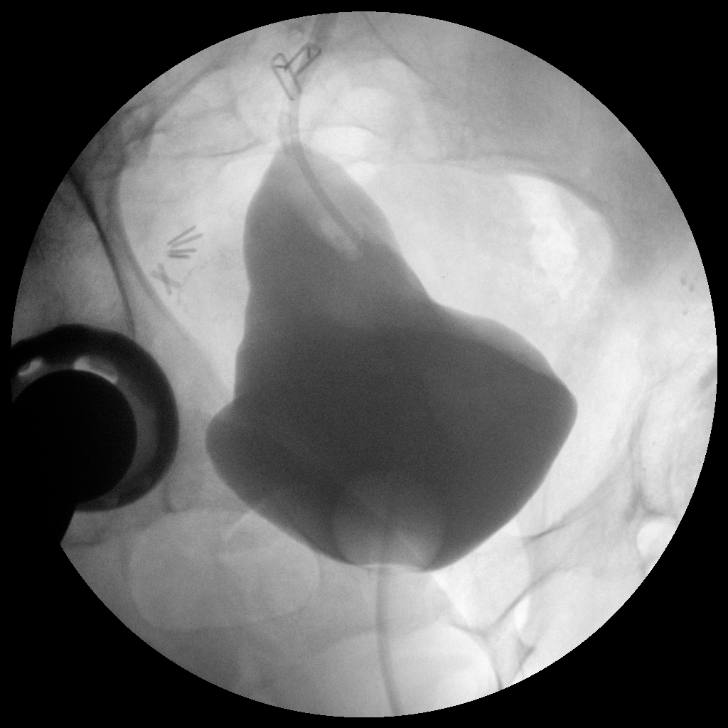

[Series 7: run · 1 of 1 slices shown (7 of 7)]
[im 1/1]
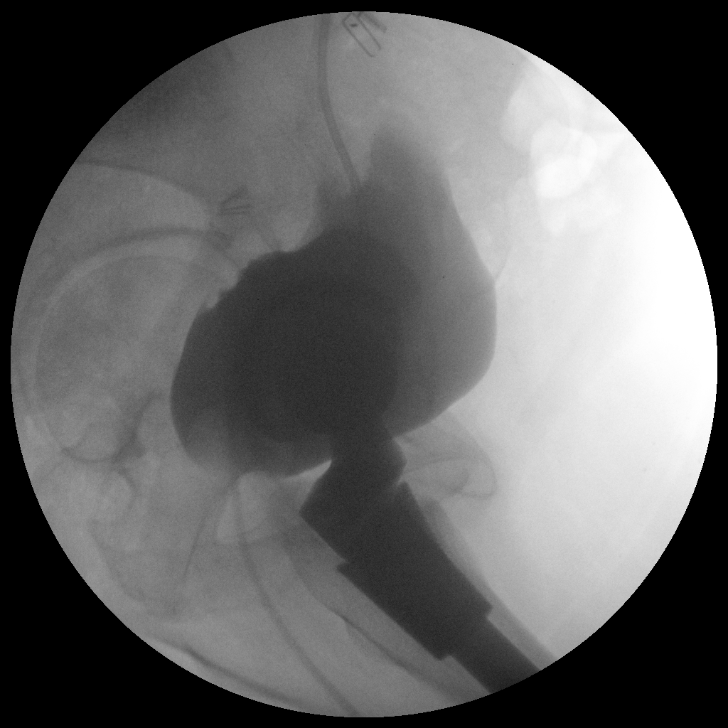

[Series 1001: view not recorded · 0.20mm/px · 1 of 1 slices shown]
[im 1/1]
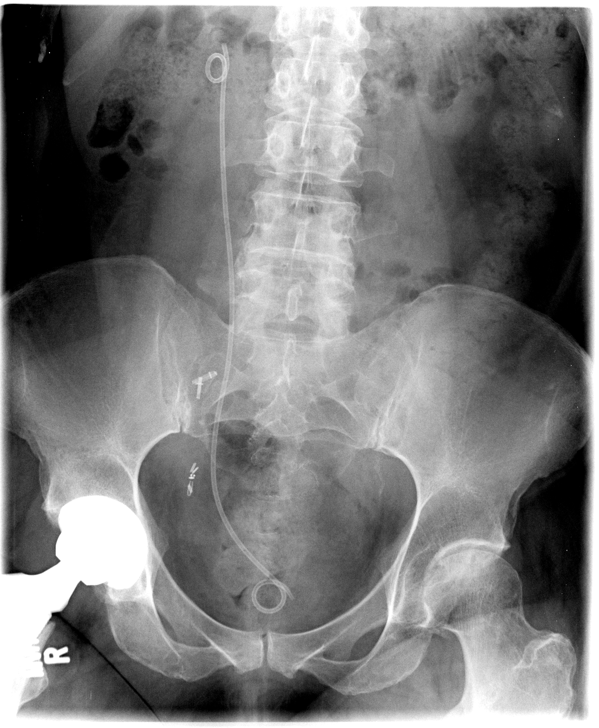

[8 of 8 positions shown; findings below may reference images not displayed]

FINDINGS: Scout radiograph demonstrates a right double pigtail
ureteral stent, surgical clips along the right pelvis, and a right
hip arthroplasty.

Early filling demonstrates a Foley catheter balloon within the
bladder.  Configuration of the bladder is compatible with right
psoas [place].

With moderate filling, contrast refluxes along the right ureteral
catheter into the right collecting system.

After filling with 200 mL, and the patient was rotated in multiple
positions to confirm that no leak was present.
IMPRESSION: Status post right distal ureterectomy with psoas bladder [place].

No evidence of leak.

## 2013-11-03 NOTE — Telephone Encounter (Signed)
Called patient to let her know that her tumor marker levels were normal per dr. Marin Olp

## 2013-11-08 LAB — ANTI MULLERIAN HORMONE: AMH AssessR: 0.07 ng/mL

## 2013-12-14 ENCOUNTER — Other Ambulatory Visit: Payer: Self-pay | Admitting: *Deleted

## 2013-12-14 MED ORDER — ATORVASTATIN CALCIUM 40 MG PO TABS: 40.0000 mg | ORAL_TABLET | Freq: Every evening | ORAL | Status: DC

## 2013-12-20 ENCOUNTER — Ambulatory Visit (INDEPENDENT_AMBULATORY_CARE_PROVIDER_SITE_OTHER): Payer: BC Managed Care – PPO | Admitting: *Deleted

## 2013-12-20 ENCOUNTER — Telehealth: Payer: Self-pay | Admitting: Cardiology

## 2013-12-20 DIAGNOSIS — I4901 Ventricular fibrillation: Secondary | ICD-10-CM

## 2013-12-20 DIAGNOSIS — I5020 Unspecified systolic (congestive) heart failure: Secondary | ICD-10-CM

## 2013-12-20 NOTE — Telephone Encounter (Signed)
Confirmed remote transmission with pt husband.  

## 2013-12-21 NOTE — Progress Notes (Signed)
Remote ICD transmission.   

## 2013-12-23 LAB — MDC_IDC_ENUM_SESS_TYPE_REMOTE
Battery Remaining Longevity: 51 mo
Battery Remaining Percentage: 60 %
Battery Voltage: 2.96 V
Brady Statistic AS VP Percent: 1 %
Brady Statistic AS VS Percent: 99 %
Brady Statistic RA Percent Paced: 1 %
Date Time Interrogation Session: 20150629075124
HIGH POWER IMPEDANCE MEASURED VALUE: 78 Ohm
HIGH POWER IMPEDANCE MEASURED VALUE: 78 Ohm
Implantable Pulse Generator Serial Number: 778164
Lead Channel Impedance Value: 400 Ohm
Lead Channel Pacing Threshold Amplitude: 0.5 V
Lead Channel Pacing Threshold Pulse Width: 0.5 ms
Lead Channel Sensing Intrinsic Amplitude: 11.4 mV
Lead Channel Sensing Intrinsic Amplitude: 2.3 mV
Lead Channel Setting Pacing Amplitude: 2 V
Lead Channel Setting Pacing Pulse Width: 1.3 ms
MDC IDC MSMT LEADCHNL RA IMPEDANCE VALUE: 1475 Ohm
MDC IDC MSMT LEADCHNL RV PACING THRESHOLD AMPLITUDE: 2.5 V
MDC IDC MSMT LEADCHNL RV PACING THRESHOLD PULSEWIDTH: 1.5 ms
MDC IDC SET LEADCHNL RV PACING AMPLITUDE: 5 V
MDC IDC SET LEADCHNL RV SENSING SENSITIVITY: 0.5 mV
MDC IDC SET ZONE DETECTION INTERVAL: 400 ms
MDC IDC STAT BRADY AP VP PERCENT: 0 %
MDC IDC STAT BRADY AP VS PERCENT: 1 %
MDC IDC STAT BRADY RV PERCENT PACED: 1 %
Zone Setting Detection Interval: 260 ms
Zone Setting Detection Interval: 315 ms

## 2013-12-29 ENCOUNTER — Encounter: Payer: Self-pay | Admitting: Cardiology

## 2014-01-03 ENCOUNTER — Encounter: Payer: Self-pay | Admitting: Internal Medicine

## 2014-01-13 ENCOUNTER — Other Ambulatory Visit: Payer: Self-pay | Admitting: Hematology & Oncology

## 2014-03-08 ENCOUNTER — Encounter: Payer: Self-pay | Admitting: Hematology & Oncology

## 2014-03-08 ENCOUNTER — Other Ambulatory Visit: Payer: Self-pay | Admitting: Family

## 2014-03-08 ENCOUNTER — Ambulatory Visit (HOSPITAL_BASED_OUTPATIENT_CLINIC_OR_DEPARTMENT_OTHER): Payer: BC Managed Care – PPO | Admitting: Hematology & Oncology

## 2014-03-08 ENCOUNTER — Other Ambulatory Visit (HOSPITAL_BASED_OUTPATIENT_CLINIC_OR_DEPARTMENT_OTHER): Payer: BC Managed Care – PPO | Admitting: Lab

## 2014-03-08 VITALS — BP 125/72 | HR 83 | Temp 97.8°F | Resp 20 | Wt 215.0 lb

## 2014-03-08 DIAGNOSIS — Z8543 Personal history of malignant neoplasm of ovary: Secondary | ICD-10-CM

## 2014-03-08 DIAGNOSIS — D3910 Neoplasm of uncertain behavior of unspecified ovary: Secondary | ICD-10-CM

## 2014-03-08 DIAGNOSIS — F411 Generalized anxiety disorder: Secondary | ICD-10-CM

## 2014-03-08 DIAGNOSIS — F419 Anxiety disorder, unspecified: Secondary | ICD-10-CM

## 2014-03-08 LAB — CBC WITH DIFFERENTIAL (CANCER CENTER ONLY)
BASO#: 0 10*3/uL (ref 0.0–0.2)
BASO%: 0.2 % (ref 0.0–2.0)
EOS%: 1.5 % (ref 0.0–7.0)
Eosinophils Absolute: 0.2 10*3/uL (ref 0.0–0.5)
HCT: 38 % (ref 34.8–46.6)
HGB: 13.2 g/dL (ref 11.6–15.9)
LYMPH#: 3.3 10*3/uL (ref 0.9–3.3)
LYMPH%: 27 % (ref 14.0–48.0)
MCH: 33.9 pg (ref 26.0–34.0)
MCHC: 34.7 g/dL (ref 32.0–36.0)
MCV: 98 fL (ref 81–101)
MONO#: 1 10*3/uL — ABNORMAL HIGH (ref 0.1–0.9)
MONO%: 8.1 % (ref 0.0–13.0)
NEUT#: 7.7 10*3/uL — ABNORMAL HIGH (ref 1.5–6.5)
NEUT%: 63.2 % (ref 39.6–80.0)
PLATELETS: 148 10*3/uL (ref 145–400)
RBC: 3.89 10*6/uL (ref 3.70–5.32)
RDW: 13.6 % (ref 11.1–15.7)
WBC: 12.2 10*3/uL — ABNORMAL HIGH (ref 3.9–10.0)

## 2014-03-08 LAB — CMP (CANCER CENTER ONLY)
ALBUMIN: 3.7 g/dL (ref 3.3–5.5)
ALK PHOS: 59 U/L (ref 26–84)
ALT(SGPT): 23 U/L (ref 10–47)
AST: 22 U/L (ref 11–38)
BUN: 19 mg/dL (ref 7–22)
CO2: 24 mEq/L (ref 18–33)
Calcium: 10.2 mg/dL (ref 8.0–10.3)
Chloride: 105 mEq/L (ref 98–108)
Creat: 1.6 mg/dl — ABNORMAL HIGH (ref 0.6–1.2)
Glucose, Bld: 101 mg/dL (ref 73–118)
POTASSIUM: 3.9 meq/L (ref 3.3–4.7)
SODIUM: 142 meq/L (ref 128–145)
Total Bilirubin: 0.5 mg/dl (ref 0.20–1.60)
Total Protein: 7.3 g/dL (ref 6.4–8.1)

## 2014-03-08 MED ORDER — ESCITALOPRAM OXALATE 10 MG PO TABS: 10.0000 mg | ORAL_TABLET | Freq: Every day | ORAL | Status: DC

## 2014-03-08 NOTE — Progress Notes (Signed)
[facility]  Telephone:([phone]) [phone] [phone]) [phone]  ID: [name] OB: 10-01-1960 MR#: [id number] [id number] Patient Care Team: Florina Ou, MD as PCP - General (Family Medicine)  DIAGNOSIS: Recurrent granulosa cell tumor of the ovary  INTERVAL HISTORY: [name] is here today for a follow-up. She states that she is feeling pretty good. Her only complaint is anxiety and feeling "on edge". She denies fever, chills, n/v, cough, rash, headaches, dizziness, SOB, chest pain, palpitations, abdominal pain, blood in urine or stool. She has had no swelling, tenderness, numbness or tingling. We are following her hormone levels. In May her anti-mllerian hormone was 0.07. Her inhibin B. was 13. She does have a pacemaker and is on amiodarone. She states that she is due for a mammogram next month so she plans on calling and making the appointment tomorrow. She also states that since she had a complete hysterectomy including her cervix she does not see a gynecologist. She also has not seen her PCP in a while and I encouraged her to call and make an appointment. Overall, she seems to be doing very well.   CURRENT TREATMENT: Observation  REVIEW OF SYSTEMS: All other 10 point review of systems is negative except for those issues mentioned above.   PAST MEDICAL HISTORY: Past Medical History  Diagnosis Date  . Arrhythmia     ventricular tachycardia  . Unspecified systolic heart failure   . Ventricular fibrillation   . ICD (implantable cardiac defibrillator) in place   . Pacemaker   . High cholesterol   . CHF (congestive heart failure)   . Myocardial infarction 2011  . Hypothyroidism   . Arthritis 02/06/2012    "everywhere"  . Bladder cancer 2009    "injected medicine to get rid of it"  . Granulosa cell carcinoma of ovary 2007    right; "had chemo"  . Pneumonia     as child  . Neuropathy     FEET AND HANDS - FROM CHEMO - BUT MUCH IMPROVED  . Port-a-cath in place    RIGHT UPPER CHEST  . Pain     JOINT PAINS AND MUSCLE ACHES ALL OVER.  Marland Kitchen Automatic implantable cardioverter-defibrillator in situ     DR. Beckie Salts   . Coronary artery disease     DR. Linard Millers  . Asthma     as a child   PAST SURGICAL HISTORY: Past Surgical History  Procedure Laterality Date  . Cardiac defibrillator placement  02/06/2012    "lead change"  . Insert / replace / remove pacemaker  10/2009    [place]  . Tonsillectomy and adenoidectomy  ~ 1967  . Appendectomy  1989  . Vaginal hysterectomy  2001  . Tubal ligation  1993  . Bilateral oophorectomy  2007  . Mitral valve replacement  2011    "pig valve"  . Cardiac valve replacement    . Transurethral resection of bladder  2009    "for bladder cancer"  . Thymus removed    . Total hip arthroplasty  05/06/2012    Procedure: TOTAL HIP ARTHROPLASTY;  Surgeon: Gearlean Alf, MD;  Location: [facility];  Service: Orthopedics;  Laterality: Right;  . Laparotomy N/A 01/19/2013    Procedure: TUMOR DEBULKING / BOWEL RESECTION/INSERTION RIGHT UTERERAL DOUBLE J STENT/OMENTECTOMY;  Surgeon: Alvino Chapel, MD;  Location: [facility];  Service: Gynecology;  Laterality: N/A;  . Cystoscopy Right 02/23/2013    Procedure: CYSTOSCOPY WITH STENT REMOVAL;  Surgeon: Cherly Anderson  Clarke-Pearson, MD;  Location: [facility];  Service: Gynecology;  Laterality: Right;  . Abdominal hysterectomy N/A 07/27/2013    Procedure: TUMOR EXCISION OF ABDOMINAL MASS;  Surgeon: Alvino Chapel, MD;  Location: [facility];  Service: Gynecology;  Laterality: N/A;   FAMILY HISTORY No family history on file.  GYNECOLOGIC HISTORY:  No LMP recorded. Patient has had a hysterectomy.   SOCIAL HISTORY:  History   Social History  . Marital Status: Divorced    Spouse Name: N/A    Number of Children: N/A  . Years of Education: N/A   Occupational History  .      WORKS FULL TIME   Social History Main Topics  . Smoking status: Current Some Day  Smoker -- 0.25 packs/day for 30 years    Types: Cigarettes    Start date: 03/28/1974  . Smokeless tobacco: Never Used     Comment:  10-27-13  still smoking daily  . Alcohol Use: No  . Drug Use: No  . Sexual Activity: Not Currently   Other Topics Concern  . Not on file   Social History Narrative   WORKS FULL TIME   SINGLE   TOBACCO USE-YES   IMPLANTATION OF DUAL-CHAMBER [place]UDE DEFIBRILLATOR   ADVANCED DIRECTIVES: <no information>  HEALTH MAINTENANCE: History  Substance Use Topics  . Smoking status: Current Some Day Smoker -- 0.25 packs/day for 30 years    Types: Cigarettes    Start date: 03/28/1974  . Smokeless tobacco: Never Used     Comment:  10-27-13  still smoking daily  . Alcohol Use: No   Colonoscopy: PAP: Bone density: Lipid panel:  Allergies  Allergen Reactions  . Prochlorperazine Edisylate Other (See Comments)    Nervous/ flutter/ shakes--compazine  . Adhesive [Tape] Other (See Comments)    Redness/hives from latex tape( tolerates latex gloves); tolerates paper tape   Current Outpatient Prescriptions  Medication Sig Dispense Refill  . amiodarone (PACERONE) 200 MG tablet Take 100 mg by mouth every morning.       Marland Kitchen aspirin 325 MG EC tablet Take 325 mg by mouth every morning.       Marland Kitchen atorvastatin (LIPITOR) 40 MG tablet Take 1 tablet (40 mg total) by mouth every evening.  30 tablet  4  . carvedilol (COREG) 12.5 MG tablet Take 12.5 mg by mouth 2 (two) times daily with a meal.      . cholecalciferol (VITAMIN D) 1000 UNITS tablet Take 1,000 Units by mouth 2 (two) times daily.      . diphenhydrAMINE (BENADRYL) 25 MG tablet Take 25 mg by mouth as needed for itching or allergies. ALLERGIES      . escitalopram (LEXAPRO) 10 MG tablet Take 1 tablet (10 mg total) by mouth daily.  30 tablet  3  . Evening Primrose Oil 1000 MG CAPS Take 1,000 mg by mouth 2 (two) times daily.      . fish oil-omega-3 fatty acids 1000 MG capsule Take 1 g by mouth 2 (two) times daily.      Marland Kitchen  levothyroxine (SYNTHROID, LEVOTHROID) 75 MCG tablet Take 75 mcg by mouth daily before breakfast.      . Multiple Vitamins-Minerals (MULTIVITAMIN PO) Take 1 tablet by mouth daily.       Marland Kitchen pyridOXINE (VITAMIN B-6) 50 MG tablet Take 50 mg by mouth 2 (two) times daily.      Marland Kitchen spironolactone (ALDACTONE) 25 MG tablet Take 25 mg by mouth 2 (two) times daily.      Marland Kitchen  zolpidem (AMBIEN) 5 MG tablet TAKE 1 TABLET BY MOUTH AT BEDTIME AS NEEDED SLEEP  30 tablet  2   No current facility-administered medications for this visit.   OBJECTIVE: Filed Vitals:   03/08/14 1136  BP: 125/72  Pulse: 83  Temp: 97.8 F (36.6 C)  Resp: 20   Body mass index is 34.72 kg/(m^2). ECOG FS:0 - Asymptomatic Ocular: Sclerae unicteric, pupils equal, round and reactive to light Ear-nose-throat: Oropharynx clear, dentition fair Lymphatic: No cervical or supraclavicular adenopathy Lungs no rales or rhonchi, good excursion bilaterally Heart regular rate and rhythm, no murmur appreciated Abd soft, nontender, positive bowel sounds MSK no focal spinal tenderness, no joint edema Neuro: non-focal, well-oriented, appropriate affect Breasts: Deferred  LAB RESULTS: CMP     Component Value Date/Time   NA 142 03/08/2014 0950   NA 135 10/27/2013 1127   NA 138 01/29/2013 1114   K 3.9 03/08/2014 0950   K 3.9 10/27/2013 1127   K 4.3 01/29/2013 1114   CL 105 03/08/2014 0950   CL 101 10/27/2013 1127   CO2 24 03/08/2014 0950   CO2 25 10/27/2013 1127   CO2 23 01/29/2013 1114   GLUCOSE 101 03/08/2014 0950   GLUCOSE 83 10/27/2013 1127   GLUCOSE 99 01/29/2013 1114   BUN 19 03/08/2014 0950   BUN 19 10/27/2013 1127   BUN 9.5 01/29/2013 1114   CREATININE 1.6* 03/08/2014 0950   CREATININE 1.50* 10/27/2013 1127   CREATININE 1.3* 01/29/2013 1114   CALCIUM 10.2 03/08/2014 0950   CALCIUM 9.6 10/27/2013 1127   CALCIUM 9.0 01/29/2013 1114   PROT 7.3 03/08/2014 0950   PROT 6.3 10/27/2013 1127   ALBUMIN 4.1 10/27/2013 1127   AST 22 03/08/2014 0950   AST 19 10/27/2013 1127   ALT  23 03/08/2014 0950   ALT 17 10/27/2013 1127   ALKPHOS 59 03/08/2014 0950   ALKPHOS 56 10/27/2013 1127   BILITOT 0.50 03/08/2014 0950   BILITOT 0.4 10/27/2013 1127   GFRNONAA 42* 07/23/2013 1150   GFRAA 49* 07/23/2013 1150   No results found for this basename: SPEP, UPEP,  kappa and lambda light chains   Lab Results  Component Value Date   WBC 12.2* 03/08/2014   NEUTROABS 7.7* 03/08/2014   HGB 13.2 03/08/2014   HCT 38.0 03/08/2014   MCV 98 03/08/2014   PLT 148 03/08/2014   No results found for this basename: LABCA2   No components found with this basename: CNOBS962   No results found for this basename: INR,  in the last 168 hours  STUDIES: No results found.  ASSESSMENT/PLAN: Ms. Bisaillon is 53 year old white female with a recurrent granulosa cell tumor. She underwent debulking for this. She did have adjuvant chemotherapy. She subsequently had another recurrence and had this resected.  She is asymptomatic at this time and there is no sign of recurrence.  I sent a prescription for Lexapro to her pharmacy. Hopefully this will help with her anxiety.  Will will see her back in 4 months for labs and follow-up.  She knows to call here with any questions or concerns and to go to the Ed in the event of an emergency. We can certainly see her back sooner if need be.   Eliezer Bottom, NP 03/08/2014 1:30 PM

## 2014-03-29 ENCOUNTER — Ambulatory Visit (INDEPENDENT_AMBULATORY_CARE_PROVIDER_SITE_OTHER): Payer: BC Managed Care – PPO | Admitting: Internal Medicine

## 2014-03-29 ENCOUNTER — Encounter: Payer: Self-pay | Admitting: Internal Medicine

## 2014-03-29 VITALS — BP 126/70 | HR 75 | Ht 66.0 in | Wt 215.2 lb

## 2014-03-29 DIAGNOSIS — Z95810 Presence of automatic (implantable) cardiac defibrillator: Secondary | ICD-10-CM

## 2014-03-29 DIAGNOSIS — I472 Ventricular tachycardia: Secondary | ICD-10-CM

## 2014-03-29 DIAGNOSIS — I4729 Other ventricular tachycardia: Secondary | ICD-10-CM

## 2014-03-29 LAB — MDC_IDC_ENUM_SESS_TYPE_INCLINIC
Battery Remaining Longevity: 67.2 mo
Brady Statistic RA Percent Paced: 0 %
HighPow Impedance: 74.25 Ohm
Implantable Pulse Generator Serial Number: 778164
Lead Channel Impedance Value: 1500 Ohm
Lead Channel Pacing Threshold Amplitude: 0.5 V
Lead Channel Pacing Threshold Amplitude: 1 V
Lead Channel Pacing Threshold Pulse Width: 0.5 ms
Lead Channel Pacing Threshold Pulse Width: 1.3 ms
Lead Channel Sensing Intrinsic Amplitude: 3 mV
Lead Channel Setting Pacing Amplitude: 2 V
Lead Channel Setting Pacing Pulse Width: 1.3 ms
MDC IDC MSMT LEADCHNL RA PACING THRESHOLD AMPLITUDE: 0.5 V
MDC IDC MSMT LEADCHNL RA PACING THRESHOLD PULSEWIDTH: 0.5 ms
MDC IDC MSMT LEADCHNL RV IMPEDANCE VALUE: 437.5 Ohm
MDC IDC MSMT LEADCHNL RV PACING THRESHOLD AMPLITUDE: 1 V
MDC IDC MSMT LEADCHNL RV PACING THRESHOLD PULSEWIDTH: 1.3 ms
MDC IDC MSMT LEADCHNL RV SENSING INTR AMPL: 11.5 mV
MDC IDC SESS DTM: 20151006114206
MDC IDC SET LEADCHNL RV PACING AMPLITUDE: 2.5 V
MDC IDC SET LEADCHNL RV SENSING SENSITIVITY: 0.5 mV
MDC IDC SET ZONE DETECTION INTERVAL: 315 ms
MDC IDC STAT BRADY RV PERCENT PACED: 0.01 %
Zone Setting Detection Interval: 260 ms
Zone Setting Detection Interval: 400 ms

## 2014-03-29 NOTE — Patient Instructions (Addendum)
Remote monitoring is used to monitor your Pacemaker of ICD from home. This monitoring reduces the number of office visits required to check your device to one time per year. It allows [place] to keep an eye on the functioning of your device to ensure it is working properly. You are scheduled for a device check from home on 09/26/14. You may send your transmission at any time that day. If you have a wireless device, the transmission will be sent automatically. After your physician reviews your transmission, you will receive a postcard with your next transmission date.  Your physician wants you to follow-up in: 1 year with Dr. Lovena Le.  You will receive a reminder letter in the mail two months in advance. If you don't receive a letter, please call our office to schedule the follow-up appointment.

## 2014-03-29 NOTE — Progress Notes (Signed)
HPI Mrs. Whitmyer returns today for followup. She is a very pleasant 53 year old woman with a nonischemic cardiomyopathy, chronic systolic heart failure, status post ICD implantation. She has class II heart failure symptoms. In the interim, she has been stable. She would like to stop taking amiodarone, because she would like to start taking Lexapro. Allergies  Allergen Reactions  . Prochlorperazine Edisylate Other (See Comments)    Nervous/ flutter/ shakes--compazine  . Adhesive [Tape] Other (See Comments)    Redness/hives from adhesive tape( tolerates latex gloves); tolerates paper tape     Current Outpatient Prescriptions  Medication Sig Dispense Refill  . aspirin 325 MG EC tablet Take 325 mg by mouth every morning.       Marland Kitchen atorvastatin (LIPITOR) 40 MG tablet Take 1 tablet (40 mg total) by mouth every evening.  30 tablet  4  . carvedilol (COREG) 12.5 MG tablet Take 12.5 mg by mouth 2 (two) times daily with a meal.      . cholecalciferol (VITAMIN D) 1000 UNITS tablet Take 1,000 Units by mouth 2 (two) times daily.      . diphenhydrAMINE (BENADRYL) 25 MG tablet Take 25 mg by mouth as needed for itching or allergies. ALLERGIES      . Evening Primrose Oil 1000 MG CAPS Take 1,000 mg by mouth 2 (two) times daily.      . fish oil-omega-3 fatty acids 1000 MG capsule Take 1 g by mouth 2 (two) times daily.      Marland Kitchen levothyroxine (SYNTHROID, LEVOTHROID) 75 MCG tablet Take 75 mcg by mouth daily before breakfast.      . Multiple Vitamins-Minerals (MULTIVITAMIN PO) Take 1 tablet by mouth daily.       Marland Kitchen pyridOXINE (VITAMIN B-6) 50 MG tablet Take 50 mg by mouth 2 (two) times daily.      Marland Kitchen spironolactone (ALDACTONE) 25 MG tablet Take 25 mg by mouth 2 (two) times daily.      Marland Kitchen zolpidem (AMBIEN) 5 MG tablet TAKE 1 TABLET BY MOUTH AT BEDTIME AS NEEDED SLEEP  30 tablet  2   No current facility-administered medications for this visit.     Past Medical History  Diagnosis Date  . Arrhythmia     ventricular  tachycardia  . Unspecified systolic heart failure   . Ventricular fibrillation   . ICD (implantable cardiac defibrillator) in place   . Pacemaker   . High cholesterol   . CHF (congestive heart failure)   . Myocardial infarction 2011  . Hypothyroidism   . Arthritis 02/06/2012    "everywhere"  . Bladder cancer 2009    "injected medicine to get rid of it"  . Granulosa cell carcinoma of ovary 2007    right; "had chemo"  . Pneumonia     as child  . Neuropathy     FEET AND HANDS - FROM CHEMO - BUT MUCH IMPROVED  . Port-a-cath in place     RIGHT UPPER CHEST  . Pain     JOINT PAINS AND MUSCLE ACHES ALL OVER.  Marland Kitchen Automatic implantable cardioverter-defibrillator in situ     DR. Beckie Salts   . Coronary artery disease     DR. Linard Millers  . Asthma     as a child    ROS:   All systems reviewed and negative except as noted in the HPI.   Past Surgical History  Procedure Laterality Date  . Cardiac defibrillator placement  02/06/2012    "lead change"  . Insert / replace /  remove pacemaker  10/2009    DUAL-CHAMBER [place]  . Tonsillectomy and adenoidectomy  ~ 1967  . Appendectomy  1989  . Vaginal hysterectomy  2001  . Tubal ligation  1993  . Bilateral oophorectomy  2007  . Mitral valve replacement  2011    "pig valve"  . Cardiac valve replacement    . Transurethral resection of bladder  2009    "for bladder cancer"  . Thymus removed    . Total hip arthroplasty  05/06/2012    Procedure: TOTAL HIP ARTHROPLASTY;  Surgeon: Gearlean Alf, MD;  Location: [facility];  Service: Orthopedics;  Laterality: Right;  . Laparotomy N/A 01/19/2013    Procedure: TUMOR DEBULKING / BOWEL RESECTION/INSERTION RIGHT UTERERAL DOUBLE J STENT/OMENTECTOMY;  Surgeon: Alvino Chapel, MD;  Location: [facility];  Service: Gynecology;  Laterality: N/A;  . Cystoscopy Right 02/23/2013    Procedure: CYSTOSCOPY WITH STENT REMOVAL;  Surgeon: Alvino Chapel, MD;  Location: [facility];  Service:  Gynecology;  Laterality: Right;  . Abdominal hysterectomy N/A 07/27/2013    Procedure: TUMOR EXCISION OF ABDOMINAL MASS;  Surgeon: Alvino Chapel, MD;  Location: [facility];  Service: Gynecology;  Laterality: N/A;     History reviewed. No pertinent family history.   History   Social History  . Marital Status: Divorced    Spouse Name: N/A    Number of Children: N/A  . Years of Education: N/A   Occupational History  .      WORKS FULL TIME   Social History Main Topics  . Smoking status: Current Some Day Smoker -- 0.25 packs/day for 30 years    Types: Cigarettes    Start date: 03/28/1974  . Smokeless tobacco: Never Used     Comment:  10-27-13  still smoking daily  . Alcohol Use: No  . Drug Use: No  . Sexual Activity: Not Currently   Other Topics Concern  . Not on file   Social History Narrative   WORKS FULL TIME   SINGLE   TOBACCO USE-YES   IMPLANTATION OF DUAL-CHAMBER [employer] DEFIBRILLATOR     BP 126/70  Pulse 75  Ht 5\' 6"  (1.676 m)  Wt 215 lb 3.2 oz (97.614 kg)  BMI 34.75 kg/m2  Physical Exam:  Well appearing middle-age woman,NAD HEENT: Unremarkable Neck:  No JVD, no thyromegally Back:  No CVA tenderness Lungs:  Clear with no wheezes, rales, or rhonchi. Right chest has a healing Port-A-Cath HEART:  Regular rate rhythm, no murmurs, no rubs, no clicks Abd:  soft, positive bowel sounds, no organomegally, no rebound, no guarding Ext:  2 plus pulses, no edema, no cyanosis, no clubbing Skin:  No rashes no nodules Neuro:  CN II through XII intact, motor grossly intact  DEVICE  Normal device function.  See PaceArt for details.   Assess/Plan:

## 2014-03-29 NOTE — Assessment & Plan Note (Signed)
Her [employer] biventricular ICD is working normally. We'll plan to recheck in several months. 

## 2014-03-29 NOTE — Assessment & Plan Note (Signed)
She has had no recurrent ventricular tachycardia. She has been on low-dose amiodarone. I recommended that she stop amiodarone and undergo watchful waiting. This will allow the patient to restart Lexapro.

## 2014-03-30 ENCOUNTER — Encounter: Payer: Self-pay | Admitting: Internal Medicine

## 2014-04-11 ENCOUNTER — Other Ambulatory Visit: Payer: Self-pay | Admitting: *Deleted

## 2014-04-11 MED ORDER — ZOLPIDEM TARTRATE 5 MG PO TABS: ORAL_TABLET | ORAL | Status: DC

## 2014-05-13 ENCOUNTER — Other Ambulatory Visit: Payer: Self-pay

## 2014-05-13 MED ORDER — ATORVASTATIN CALCIUM 40 MG PO TABS: 40.0000 mg | ORAL_TABLET | Freq: Every evening | ORAL | Status: DC

## 2014-05-18 ENCOUNTER — Ambulatory Visit (INDEPENDENT_AMBULATORY_CARE_PROVIDER_SITE_OTHER): Payer: BC Managed Care – PPO | Admitting: Interventional Cardiology

## 2014-05-18 ENCOUNTER — Encounter: Payer: Self-pay | Admitting: Interventional Cardiology

## 2014-05-18 VITALS — BP 92/74 | HR 76 | Ht 66.0 in | Wt 220.1 lb

## 2014-05-18 DIAGNOSIS — Z954 Presence of other heart-valve replacement: Secondary | ICD-10-CM

## 2014-05-18 DIAGNOSIS — Z95810 Presence of automatic (implantable) cardiac defibrillator: Secondary | ICD-10-CM

## 2014-05-18 DIAGNOSIS — I5022 Chronic systolic (congestive) heart failure: Secondary | ICD-10-CM

## 2014-05-18 DIAGNOSIS — Z952 Presence of prosthetic heart valve: Secondary | ICD-10-CM

## 2014-05-18 DIAGNOSIS — D3910 Neoplasm of uncertain behavior of unspecified ovary: Secondary | ICD-10-CM

## 2014-05-18 DIAGNOSIS — I25718 Atherosclerosis of autologous vein coronary artery bypass graft(s) with other forms of angina pectoris: Secondary | ICD-10-CM

## 2014-05-18 NOTE — Progress Notes (Signed)
Patient ID: [name], female   DOB: 15-Apr-1961, 53 y.o.   MRN: [id number]    [address], [address], [place]  [address] Phone: [phone] Fax:  [phone]  Date:  05/18/2014   ID:  [name], DOB June 23, 1961, MRN [id number]  PCP:  Florina Ou, MD   ASSESSMENT:  1. Coronary atherosclerotic heart disease with prior inferior infarction, complicated by ventricular fibrillation and heart failure, stable without angina 2. AICD without discharge or complications 3. Chronic systolic heart failure, current LV status unknown, with class II functional status 4. Mitral valve replacement with bioprosthesis 2011 current status unknown 5. Granulosa cell tumor of the ovary, treated with debulking and chemotherapy. Current approximate 8 year history of living with the disease  PLAN:  1. 2-D Doppler echocardiogram to assess LV function and mitral valve prosthesis 2. No change in current medical regimen 3. Clinical follow-up in one year   SUBJECTIVE: [name] is a 53 y.o. female doing well. She has not had cardiovascular complaints. She specifically denies dyspnea, orthopnea, PND, palpitations, syncope, angina, and edema.   Wt Readings from Last 3 Encounters:  05/18/14 220 lb 1.9 oz (99.846 kg)  03/29/14 215 lb 3.2 oz (97.614 kg)  03/08/14 215 lb (97.523 kg)     Past Medical History  Diagnosis Date  . Arrhythmia     ventricular tachycardia  . Unspecified systolic heart failure   . Ventricular fibrillation   . ICD (implantable cardiac defibrillator) in place   . Pacemaker   . High cholesterol   . CHF (congestive heart failure)   . Myocardial infarction 2011  . Hypothyroidism   . Arthritis 02/06/2012    "everywhere"  . Bladder cancer 2009    "injected medicine to get rid of it"  . Granulosa cell carcinoma of ovary 2007    right; "had chemo"  . Pneumonia     as child  . Neuropathy     FEET AND HANDS - FROM CHEMO - BUT MUCH IMPROVED  .  Port-a-cath in place     RIGHT UPPER CHEST  . Pain     JOINT PAINS AND MUSCLE ACHES ALL OVER.  Marland Kitchen Automatic implantable cardioverter-defibrillator in situ     DR. Beckie Salts   . Coronary artery disease     DR. Linard Millers  . Asthma     as a child    Current Outpatient Prescriptions  Medication Sig Dispense Refill  . aspirin 325 MG EC tablet Take 325 mg by mouth every morning.     Marland Kitchen atorvastatin (LIPITOR) 40 MG tablet Take 1 tablet (40 mg total) by mouth every evening. 30 tablet 4  . carvedilol (COREG) 12.5 MG tablet Take 12.5 mg by mouth 2 (two) times daily with a meal.    . cholecalciferol (VITAMIN D) 1000 UNITS tablet Take 1,000 Units by mouth 2 (two) times daily.    . diphenhydrAMINE (BENADRYL) 25 MG tablet Take 25 mg by mouth as needed for itching or allergies. ALLERGIES    . Evening Primrose Oil 1000 MG CAPS Take 1,000 mg by mouth 2 (two) times daily.    . fish oil-omega-3 fatty acids 1000 MG capsule Take 1 g by mouth 2 (two) times daily.    Marland Kitchen levothyroxine (SYNTHROID, LEVOTHROID) 75 MCG tablet Take 75 mcg by mouth daily before breakfast.    . Multiple Vitamins-Minerals (MULTIVITAMIN PO) Take 1 tablet by mouth daily.     Marland Kitchen pyridOXINE (VITAMIN B-6) 50 MG  tablet Take 50 mg by mouth 2 (two) times daily.    Marland Kitchen spironolactone (ALDACTONE) 25 MG tablet Take 25 mg by mouth 2 (two) times daily.    Marland Kitchen zolpidem (AMBIEN) 5 MG tablet TAKE 1 TABLET BY MOUTH AT BEDTIME AS NEEDED SLEEP 30 tablet 2   No current facility-administered medications for this visit.    Allergies:    Allergies  Allergen Reactions  . Prochlorperazine Edisylate Other (See Comments)    Nervous/ flutter/ shakes--compazine  . Adhesive [Tape] Other (See Comments)    Redness/hives from adhesive tape( tolerates latex gloves); tolerates paper tape    Social History:  The patient  reports that she has been smoking Cigarettes.  She started smoking about 40 years ago. She has a 7.5 pack-year smoking history. She has never used  smokeless tobacco. She reports that she does not drink alcohol or use illicit drugs.   ROS:  Please see the history of present illness.   Appetite is been stable. No transient neurological complaints. Denies palpitations and atrial fibrillation.   All other systems reviewed and negative.   OBJECTIVE: VS:  BP 92/74 mmHg  Pulse 76  Ht 5\' 6"  (1.676 m)  Wt 220 lb 1.9 oz (99.846 kg)  BMI 35.55 kg/m2 Well nourished, well developed, in no acute distress, obese HEENT: normal Neck: JVD flat. Carotid bruit absent  Cardiac:  normal S1, S2; RRR; no murmur Lungs:  clear to auscultation bilaterally, no wheezing, rhonchi or rales Abd: soft, nontender, no hepatomegaly Ext: Edema absent. Pulses 2+ and symmetric Skin: warm and dry Neuro:  CNs 2-12 intact, no focal abnormalities noted  EKG:  Normal sinus rhythm with left atrial abnormality and inferior Q waves consistent with old infarct. Q waves extend into V4 through V6.       Signed, Illene Labrador III, MD 05/18/2014 11:50 AM

## 2014-05-18 NOTE — Patient Instructions (Signed)
Your physician wants you to follow-up in:  12 months.  You will receive a reminder letter in the mail two months in advance. If you don't receive a letter, please call our office to schedule the follow-up appointment.  Your physician has requested that you have an echocardiogram. Echocardiography is a painless test that uses sound waves to create images of your heart. It provides your doctor with information about the size and shape of your heart and how well your heart's chambers and valves are working. This procedure takes approximately one hour. There are no restrictions for this procedure.  Patient would like to do in January.

## 2014-06-02 ENCOUNTER — Encounter (HOSPITAL_COMMUNITY): Payer: Self-pay | Admitting: Internal Medicine

## 2014-06-14 ENCOUNTER — Other Ambulatory Visit: Payer: Self-pay

## 2014-06-14 MED ORDER — CARVEDILOL 12.5 MG PO TABS: 12.5000 mg | ORAL_TABLET | Freq: Two times a day (BID) | ORAL | Status: DC

## 2014-06-14 MED ORDER — SPIRONOLACTONE 25 MG PO TABS: 25.0000 mg | ORAL_TABLET | Freq: Two times a day (BID) | ORAL | Status: DC

## 2014-06-28 ENCOUNTER — Telehealth: Payer: Self-pay | Admitting: Hematology & Oncology

## 2014-06-28 NOTE — Telephone Encounter (Signed)
Pt made 2-5 appointment

## 2014-07-11 ENCOUNTER — Ambulatory Visit (HOSPITAL_COMMUNITY): Payer: BLUE CROSS/BLUE SHIELD | Attending: Internal Medicine | Admitting: Cardiology

## 2014-07-11 DIAGNOSIS — I509 Heart failure, unspecified: Secondary | ICD-10-CM | POA: Diagnosis present

## 2014-07-11 DIAGNOSIS — I059 Rheumatic mitral valve disease, unspecified: Secondary | ICD-10-CM | POA: Diagnosis not present

## 2014-07-11 DIAGNOSIS — Z954 Presence of other heart-valve replacement: Secondary | ICD-10-CM

## 2014-07-11 DIAGNOSIS — Z952 Presence of prosthetic heart valve: Secondary | ICD-10-CM

## 2014-07-11 NOTE — Progress Notes (Signed)
Echo performed. 

## 2014-07-12 ENCOUNTER — Other Ambulatory Visit: Payer: Self-pay | Admitting: Hematology & Oncology

## 2014-07-13 ENCOUNTER — Encounter: Payer: Self-pay | Admitting: Interventional Cardiology

## 2014-07-14 ENCOUNTER — Other Ambulatory Visit: Payer: Self-pay | Admitting: Hematology & Oncology

## 2014-07-14 ENCOUNTER — Telehealth: Payer: Self-pay

## 2014-07-14 NOTE — Telephone Encounter (Signed)
Called to give pt echo results.lmtcb 

## 2014-07-14 NOTE — Telephone Encounter (Signed)
-----   Message from Biddle, MD sent at 07/13/2014 10:25 AM EST ----- LV function is moderately to severely decreased.but stable. Valve okay but with some evidence of leaflet stiffness. We will follow over time. F/U appt as previously scheduled 1 year from now.

## 2014-07-18 ENCOUNTER — Other Ambulatory Visit: Payer: Self-pay | Admitting: Hematology & Oncology

## 2014-07-29 ENCOUNTER — Other Ambulatory Visit (HOSPITAL_BASED_OUTPATIENT_CLINIC_OR_DEPARTMENT_OTHER): Payer: BLUE CROSS/BLUE SHIELD | Admitting: Lab

## 2014-07-29 ENCOUNTER — Ambulatory Visit (HOSPITAL_BASED_OUTPATIENT_CLINIC_OR_DEPARTMENT_OTHER): Payer: BLUE CROSS/BLUE SHIELD | Admitting: Hematology & Oncology

## 2014-07-29 ENCOUNTER — Telehealth: Payer: Self-pay | Admitting: Hematology & Oncology

## 2014-07-29 VITALS — BP 113/70 | HR 78 | Temp 97.2°F | Resp 18 | Wt 221.0 lb

## 2014-07-29 DIAGNOSIS — D3910 Neoplasm of uncertain behavior of unspecified ovary: Secondary | ICD-10-CM

## 2014-07-29 LAB — CBC WITH DIFFERENTIAL (CANCER CENTER ONLY)
BASO#: 0 10*3/uL (ref 0.0–0.2)
BASO%: 0.2 % (ref 0.0–2.0)
EOS%: 1.5 % (ref 0.0–7.0)
Eosinophils Absolute: 0.2 10*3/uL (ref 0.0–0.5)
HCT: 37.1 % (ref 34.8–46.6)
HGB: 12.6 g/dL (ref 11.6–15.9)
LYMPH#: 2.7 10*3/uL (ref 0.9–3.3)
LYMPH%: 26.5 % (ref 14.0–48.0)
MCH: 33.6 pg (ref 26.0–34.0)
MCHC: 34 g/dL (ref 32.0–36.0)
MCV: 99 fL (ref 81–101)
MONO#: 1 10*3/uL — AB (ref 0.1–0.9)
MONO%: 9.3 % (ref 0.0–13.0)
NEUT#: 6.4 10*3/uL (ref 1.5–6.5)
NEUT%: 62.5 % (ref 39.6–80.0)
Platelets: 138 10*3/uL — ABNORMAL LOW (ref 145–400)
RBC: 3.75 10*6/uL (ref 3.70–5.32)
RDW: 13.3 % (ref 11.1–15.7)
WBC: 10.3 10*3/uL — ABNORMAL HIGH (ref 3.9–10.0)

## 2014-07-29 NOTE — Telephone Encounter (Signed)
Pt wanted 8-12 appointment instead of 8-5

## 2014-07-29 NOTE — Progress Notes (Signed)
Hematology and Oncology Follow Up Visit  [name] [id number] 06-11-1961 54 y.o. 07/29/2014   Principle Diagnosis:   Recurrent granulosa cell tumor of the ovary  Current Therapy:    Observation     Interim History:  Ms.  [name] is back for followup. Last saw her back in early September. She's been doing pretty well. We are following her hormone levels. Her anti-mllerian hormone was less than 0.03. Her inhibin B. was 13.  She is off amiodarone right now. Her heart is doing pretty well. She's had no problems with respect to arrhythmias or heart failure. She has a pacemaker in place which is working quite well. She does see cardiology on a routine basis.  She's working. Work is a little bit slow right now.  She's had no leg swelling. She's had no rashes.  She's had no abdominal pain. She's had no cough shortness of breath. She's had no change in bowel or bladder habits. His been no leg swelling.  She's had no rashes. She's had no headache.  Medications:  Current outpatient prescriptions:  .  aspirin 325 MG EC tablet, Take 325 mg by mouth every morning. , Disp: , Rfl:  .  atorvastatin (LIPITOR) 40 MG tablet, Take 1 tablet (40 mg total) by mouth every evening., Disp: 30 tablet, Rfl: 4 .  carvedilol (COREG) 12.5 MG tablet, Take 1 tablet (12.5 mg total) by mouth 2 (two) times daily with a meal., Disp: 60 tablet, Rfl: 6 .  cholecalciferol (VITAMIN D) 1000 UNITS tablet, Take 1,000 Units by mouth 2 (two) times daily., Disp: , Rfl:  .  diphenhydrAMINE (BENADRYL) 25 MG tablet, Take 25 mg by mouth as needed for itching or allergies. ALLERGIES, Disp: , Rfl:  .  Evening Primrose Oil 1000 MG CAPS, Take 1,000 mg by mouth 2 (two) times daily., Disp: , Rfl:  .  fish oil-omega-3 fatty acids 1000 MG capsule, Take 1 g by mouth 2 (two) times daily., Disp: , Rfl:  .  levothyroxine (SYNTHROID, LEVOTHROID) 75 MCG tablet, Take 75 mcg by mouth daily before breakfast., Disp: , Rfl:  .  Multiple  Vitamins-Minerals (MULTIVITAMIN PO), Take 1 tablet by mouth daily. , Disp: , Rfl:  .  pyridOXINE (VITAMIN B-6) 50 MG tablet, Take 50 mg by mouth 2 (two) times daily., Disp: , Rfl:  .  spironolactone (ALDACTONE) 25 MG tablet, Take 1 tablet (25 mg total) by mouth 2 (two) times daily., Disp: 60 tablet, Rfl: 6 .  zolpidem (AMBIEN) 5 MG tablet, TAKE 1 TABLET BY MOUTH AT BEDTIME AS NEEDED, Disp: 30 tablet, Rfl: 2  Allergies:  Allergies  Allergen Reactions  . Prochlorperazine Edisylate Other (See Comments)    Nervous/ flutter/ shakes--compazine  . Adhesive [Tape] Other (See Comments)    Redness/hives from adhesive tape( tolerates latex gloves); tolerates paper tape    Past Medical History, Surgical history, Social history, and Family History were reviewed and updated.  Review of Systems: As above  Physical Exam:  weight is 221 lb (100.245 kg). Her oral temperature is 97.2 F (36.2 C). Her blood pressure is 113/70 and her pulse is 78. Her respiration is 18.   Well-developed well-nourished white female. Lungs are clear. Head and neck exam shows no ocular or oral lesion. There is no adenopathy. Lungs are clear. Cardiac exam regular rate and rhythm. She is a 1/6 systolic murmur. Abdomen soft presumably a laparotomy scars. She has no fluid wave. No palpable liver or spleen. There is no obvious abdominal mass.  Back exam shows no tenderness over the spine ribs or hips. Extremities shows no clubbing cyanosis or edema. She has good range of motion of her joints. She has good strength in her muscle groups. Skin exam no rashes, ecchymoses or petechia. Neurological exam shows no focal neurological deficits.  Lab Results  Component Value Date   WBC 10.3* 07/29/2014   HGB 12.6 07/29/2014   HCT 37.1 07/29/2014   MCV 99 07/29/2014   PLT 138* 07/29/2014     Chemistry      Component Value Date/Time   NA 142 03/08/2014 0950   NA 135 10/27/2013 1127   NA 138 01/29/2013 1114   K 3.9 03/08/2014 0950   K  3.9 10/27/2013 1127   K 4.3 01/29/2013 1114   CL 105 03/08/2014 0950   CL 101 10/27/2013 1127   CO2 24 03/08/2014 0950   CO2 25 10/27/2013 1127   CO2 23 01/29/2013 1114   BUN 19 03/08/2014 0950   BUN 19 10/27/2013 1127   BUN 9.5 01/29/2013 1114   CREATININE 1.6* 03/08/2014 0950   CREATININE 1.50* 10/27/2013 1127   CREATININE 1.3* 01/29/2013 1114      Component Value Date/Time   CALCIUM 10.2 03/08/2014 0950   CALCIUM 9.6 10/27/2013 1127   CALCIUM 9.0 01/29/2013 1114   ALKPHOS 59 03/08/2014 0950   ALKPHOS 56 10/27/2013 1127   AST 22 03/08/2014 0950   AST 19 10/27/2013 1127   ALT 23 03/08/2014 0950   ALT 17 10/27/2013 1127   BILITOT 0.50 03/08/2014 0950   BILITOT 0.4 10/27/2013 1127         Impression and Plan: [name] is 54 year old white female with a recurrent granulosa cell tumor. She underwent debulking.She did have adjuvant chemotherapy. She subsequently had another recurrence. She had this resected. This was resected back on thyroid fifth 2015.  So far, everything looks good.  Her Port-A-Cath was taken out.  Will plan to get her back now in about 6 was months. I think this would be reasonable.   Volanda Napoleon, MD 2/5/201611:46 AM

## 2014-08-03 LAB — COMPREHENSIVE METABOLIC PANEL
ALT: 14 U/L (ref 0–35)
AST: 17 U/L (ref 0–37)
Albumin: 4.1 g/dL (ref 3.5–5.2)
Alkaline Phosphatase: 68 U/L (ref 39–117)
BUN: 20 mg/dL (ref 6–23)
CALCIUM: 10.5 mg/dL (ref 8.4–10.5)
CO2: 25 mEq/L (ref 19–32)
CREATININE: 1.74 mg/dL — AB (ref 0.50–1.10)
Chloride: 102 mEq/L (ref 96–112)
Glucose, Bld: 77 mg/dL (ref 70–99)
POTASSIUM: 4.2 meq/L (ref 3.5–5.3)
SODIUM: 139 meq/L (ref 135–145)
Total Bilirubin: 0.4 mg/dL (ref 0.2–1.2)
Total Protein: 6.6 g/dL (ref 6.0–8.3)

## 2014-08-03 LAB — INHIBIN B: Inhibin B: 20 pg/mL

## 2014-08-03 LAB — LACTATE DEHYDROGENASE: LDH: 139 U/L (ref 94–250)

## 2014-08-03 LAB — ANTI MULLERIAN HORMONE: AMH AssessR: 0.18 ng/mL

## 2014-08-08 ENCOUNTER — Telehealth: Payer: Self-pay | Admitting: *Deleted

## 2014-08-08 NOTE — Telephone Encounter (Addendum)
Spoke with patient, she is in understanding and will increase her fluid intake  ----- Message from Volanda Napoleon, MD sent at 08/05/2014  4:36 PM EST ----- Call - labs are normal!! Your kidney function is a little low, but nothing you would notice.  Please drink a lot of fluid as this will help your kidneys!!!!  [name]

## 2014-09-26 ENCOUNTER — Encounter: Payer: Self-pay | Admitting: Internal Medicine

## 2014-09-26 ENCOUNTER — Ambulatory Visit (INDEPENDENT_AMBULATORY_CARE_PROVIDER_SITE_OTHER): Payer: 59 | Admitting: *Deleted

## 2014-09-26 DIAGNOSIS — I472 Ventricular tachycardia: Secondary | ICD-10-CM | POA: Diagnosis not present

## 2014-09-26 DIAGNOSIS — I5022 Chronic systolic (congestive) heart failure: Secondary | ICD-10-CM

## 2014-09-26 DIAGNOSIS — I4729 Other ventricular tachycardia: Secondary | ICD-10-CM

## 2014-09-26 NOTE — Progress Notes (Signed)
Remote ICD transmission.   

## 2014-09-28 LAB — MDC_IDC_ENUM_SESS_TYPE_REMOTE
Battery Remaining Longevity: 55 mo
Battery Remaining Percentage: 53 %
Brady Statistic AP VP Percent: 1 %
Brady Statistic AS VS Percent: 99 %
HIGH POWER IMPEDANCE MEASURED VALUE: 68 Ohm
HighPow Impedance: 68 Ohm
Implantable Pulse Generator Serial Number: 778164
Lead Channel Impedance Value: 1500 Ohm
Lead Channel Impedance Value: 390 Ohm
Lead Channel Pacing Threshold Amplitude: 0.5 V
Lead Channel Pacing Threshold Amplitude: 1 V
Lead Channel Pacing Threshold Pulse Width: 0.5 ms
Lead Channel Pacing Threshold Pulse Width: 1.3 ms
Lead Channel Sensing Intrinsic Amplitude: 3.3 mV
Lead Channel Setting Sensing Sensitivity: 0.5 mV
MDC IDC MSMT BATTERY VOLTAGE: 2.95 V
MDC IDC MSMT LEADCHNL RV SENSING INTR AMPL: 11.4 mV
MDC IDC SESS DTM: 20160404092653
MDC IDC SET LEADCHNL RA PACING AMPLITUDE: 2 V
MDC IDC SET LEADCHNL RV PACING AMPLITUDE: 2.5 V
MDC IDC SET LEADCHNL RV PACING PULSEWIDTH: 1.3 ms
MDC IDC SET ZONE DETECTION INTERVAL: 260 ms
MDC IDC STAT BRADY AP VS PERCENT: 1 %
MDC IDC STAT BRADY AS VP PERCENT: 1 %
MDC IDC STAT BRADY RA PERCENT PACED: 1 %
MDC IDC STAT BRADY RV PERCENT PACED: 1 %
Zone Setting Detection Interval: 315 ms
Zone Setting Detection Interval: 400 ms

## 2014-10-04 ENCOUNTER — Telehealth: Payer: Self-pay | Admitting: Interventional Cardiology

## 2014-10-04 ENCOUNTER — Other Ambulatory Visit: Payer: Self-pay

## 2014-10-04 DIAGNOSIS — Z9229 Personal history of other drug therapy: Secondary | ICD-10-CM

## 2014-10-04 MED ORDER — LEVOTHYROXINE SODIUM 75 MCG PO TABS: 75.0000 ug | ORAL_TABLET | Freq: Every day | ORAL | Status: DC

## 2014-10-04 NOTE — Telephone Encounter (Signed)
Per Dr.Smith ok to refill pt levothyroxine.pt needs to come to the office for a TSH lab. Lab appt scheduled for 4/13. Rx sent to pt pharmacy. Pt verbalized understanding.

## 2014-10-04 NOTE — Telephone Encounter (Signed)
New Message    Patient needs blood work to get levothyoroxine 72mcg/. Please give patient a call , [phone]

## 2014-10-05 ENCOUNTER — Other Ambulatory Visit (INDEPENDENT_AMBULATORY_CARE_PROVIDER_SITE_OTHER): Payer: 59 | Admitting: *Deleted

## 2014-10-05 DIAGNOSIS — Z9229 Personal history of other drug therapy: Secondary | ICD-10-CM

## 2014-10-05 DIAGNOSIS — Z09 Encounter for follow-up examination after completed treatment for conditions other than malignant neoplasm: Secondary | ICD-10-CM | POA: Diagnosis not present

## 2014-10-05 LAB — TSH: TSH: 4.46 u[IU]/mL (ref 0.35–4.50)

## 2014-10-06 ENCOUNTER — Encounter: Payer: Self-pay | Admitting: Cardiology

## 2014-10-16 ENCOUNTER — Other Ambulatory Visit: Payer: Self-pay | Admitting: Hematology & Oncology

## 2014-10-19 ENCOUNTER — Other Ambulatory Visit: Payer: Self-pay | Admitting: Hematology & Oncology

## 2015-01-08 ENCOUNTER — Other Ambulatory Visit: Payer: Self-pay | Admitting: Interventional Cardiology

## 2015-01-23 ENCOUNTER — Other Ambulatory Visit: Payer: Self-pay | Admitting: Hematology & Oncology

## 2015-02-02 ENCOUNTER — Telehealth: Payer: Self-pay | Admitting: Hematology & Oncology

## 2015-02-02 NOTE — Telephone Encounter (Signed)
LT MESS REGARDING APPT FOR 8/12

## 2015-02-03 ENCOUNTER — Other Ambulatory Visit (HOSPITAL_BASED_OUTPATIENT_CLINIC_OR_DEPARTMENT_OTHER): Payer: 59

## 2015-02-03 ENCOUNTER — Ambulatory Visit (HOSPITAL_BASED_OUTPATIENT_CLINIC_OR_DEPARTMENT_OTHER): Payer: 59 | Admitting: Hematology & Oncology

## 2015-02-03 ENCOUNTER — Encounter: Payer: Self-pay | Admitting: Hematology & Oncology

## 2015-02-03 VITALS — BP 108/66 | HR 76 | Temp 97.9°F | Resp 20 | Ht 65.0 in | Wt 222.0 lb

## 2015-02-03 DIAGNOSIS — D3910 Neoplasm of uncertain behavior of unspecified ovary: Secondary | ICD-10-CM

## 2015-02-03 DIAGNOSIS — N390 Urinary tract infection, site not specified: Secondary | ICD-10-CM

## 2015-02-03 DIAGNOSIS — R110 Nausea: Secondary | ICD-10-CM

## 2015-02-03 LAB — CBC WITH DIFFERENTIAL (CANCER CENTER ONLY)
BASO#: 0 10*3/uL (ref 0.0–0.2)
BASO%: 0.4 % (ref 0.0–2.0)
EOS%: 2.1 % (ref 0.0–7.0)
Eosinophils Absolute: 0.2 10*3/uL (ref 0.0–0.5)
HCT: 38.5 % (ref 34.8–46.6)
HGB: 13.1 g/dL (ref 11.6–15.9)
LYMPH#: 2.1 10*3/uL (ref 0.9–3.3)
LYMPH%: 22 % (ref 14.0–48.0)
MCH: 32.3 pg (ref 26.0–34.0)
MCHC: 34 g/dL (ref 32.0–36.0)
MCV: 95 fL (ref 81–101)
MONO#: 1 10*3/uL — AB (ref 0.1–0.9)
MONO%: 10 % (ref 0.0–13.0)
NEUT%: 65.5 % (ref 39.6–80.0)
NEUTROS ABS: 6.3 10*3/uL (ref 1.5–6.5)
PLATELETS: 151 10*3/uL (ref 145–400)
RBC: 4.05 10*6/uL (ref 3.70–5.32)
RDW: 13.3 % (ref 11.1–15.7)
WBC: 9.6 10*3/uL (ref 3.9–10.0)

## 2015-02-03 MED ORDER — LORAZEPAM 0.5 MG PO TABS: 0.5000 mg | ORAL_TABLET | Freq: Four times a day (QID) | ORAL | Status: DC | PRN

## 2015-02-03 NOTE — Progress Notes (Signed)
Hematology and Oncology Follow Up Visit  [name] [id number] May 31, 1961 54 y.o. 02/03/2015   Principle Diagnosis:   Recurrent granulosa cell tumor of the ovary  Current Therapy:    Observation     Interim History:  [name] is back for followup. She is doing pretty well. She is still working. She and her family will be going to the [place] for Labor Day.  She has had no problems with abdominal pain. He's been no change in bowel or bladder habits. 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 This is not causing any issues for her. She is being followed by cardiology for this.  She's had no leg swelling.  There's been no weakness.  She's had no cough or shortness of breath.  Her last inhibin B level was 20. This is holding fairly stable.  She's had her last mammogram done about a year ago.   Overall, her performance status is ECOG 0.  Medications:  Current outpatient prescriptions:  .  aspirin 325 MG EC tablet, Take 325 mg by mouth every morning. , Disp: , Rfl:  .  atorvastatin (LIPITOR) 40 MG tablet, Take 1 tablet (40 mg total) by mouth every evening., Disp: 30 tablet, Rfl: 4 .  carvedilol (COREG) 12.5 MG tablet, TAKE 1 TABLET (12.5 MG TOTAL) BY MOUTH 2 (TWO) TIMES DAILY WITH A MEAL., Disp: 60 tablet, Rfl: 3 .  cholecalciferol (VITAMIN D) 1000 UNITS tablet, Take 1,000 Units by mouth 2 (two) times daily., Disp: , Rfl:  .  diphenhydrAMINE (BENADRYL) 25 MG tablet, Take 25 mg by mouth as needed for itching or allergies. ALLERGIES, Disp: , Rfl:  .  Evening Primrose Oil 1000 MG CAPS, Take 1,000 mg by mouth 2 (two) times daily., Disp: , Rfl:  .  fish oil-omega-3 fatty acids 1000 MG capsule, Take 1 g by mouth 2 (two) times daily., Disp: , Rfl:  .  levothyroxine (SYNTHROID, LEVOTHROID) 75 MCG tablet, TAKE 1 TABLET (75 MCG TOTAL) BY MOUTH DAILY BEFORE BREAKFAST., Disp: 30 tablet, Rfl: 3 .  Multiple Vitamins-Minerals (MULTIVITAMIN PO), Take 1 tablet by mouth daily. , Disp: , Rfl:  .   spironolactone (ALDACTONE) 25 MG tablet, TAKE 1 TABLET (25 MG TOTAL) BY MOUTH 2 (TWO) TIMES DAILY., Disp: 60 tablet, Rfl: 3 .  zolpidem (AMBIEN) 5 MG tablet, Take 1 tablet (5 mg total) by mouth at bedtime., Disp: 30 tablet, Rfl: 2 .  LORazepam (ATIVAN) 0.5 MG tablet, Take 1 tablet (0.5 mg total) by mouth every 6 (six) hours as needed (Nausea or vomiting)., Disp: 60 tablet, Rfl: 2 .  pyridOXINE (VITAMIN B-6) 50 MG tablet, Take 50 mg by mouth 2 (two) times daily., Disp: , Rfl:   Allergies:  Allergies  Allergen Reactions  . Prochlorperazine Edisylate Other (See Comments)    Nervous/ flutter/ shakes--compazine  . Adhesive [Tape] Other (See Comments)    Redness/hives from adhesive tape( tolerates latex gloves); tolerates paper tape    Past Medical History, Surgical history, Social history, and Family History were reviewed and updated.  Review of Systems: As above  Physical Exam:  height is 5\' 5"  (1.651 m) and weight is 222 lb (100.699 kg). Her temperature is 97.9 F (36.6 C). Her blood pressure is 108/66 and her pulse is 76. Her respiration is 20.   Well-developed well-nourished white female. Lungs are clear. Head and neck exam shows no ocular or oral lesion. There is no adenopathy. Lungs are clear. Cardiac exam regular rate and rhythm. She is a  1/6 systolic murmur. Abdomen is soft with well-healed laparotomy scars. She has no fluid wave. No palpable liver or spleen. There is no obvious abdominal mass. Back exam shows no tenderness over the spine ribs or hips. Extremities shows no clubbing cyanosis or edema. She has good range of motion of her joints. She has good strength in her muscle groups. Skin exam no rashes, ecchymoses or petechia. Neurological exam shows no focal neurological deficits.  Lab Results  Component Value Date   WBC 9.6 02/03/2015   HGB 13.1 02/03/2015   HCT 38.5 02/03/2015   MCV 95 02/03/2015   PLT 151 02/03/2015     Chemistry      Component Value Date/Time   NA 139  07/29/2014 1053   NA 142 03/08/2014 0950   NA 138 01/29/2013 1114   K 4.2 07/29/2014 1053   K 3.9 03/08/2014 0950   K 4.3 01/29/2013 1114   CL 102 07/29/2014 1053   CL 105 03/08/2014 0950   CO2 25 07/29/2014 1053   CO2 24 03/08/2014 0950   CO2 23 01/29/2013 1114   BUN 20 07/29/2014 1053   BUN 19 03/08/2014 0950   BUN 9.5 01/29/2013 1114   CREATININE 1.74* 07/29/2014 1053   CREATININE 1.6* 03/08/2014 0950   CREATININE 1.3* 01/29/2013 1114      Component Value Date/Time   CALCIUM 10.5 07/29/2014 1053   CALCIUM 10.2 03/08/2014 0950   CALCIUM 9.0 01/29/2013 1114   ALKPHOS 68 07/29/2014 1053   ALKPHOS 59 03/08/2014 0950   AST 17 07/29/2014 1053   AST 22 03/08/2014 0950   ALT 14 07/29/2014 1053   ALT 23 03/08/2014 0950   BILITOT 0.4 07/29/2014 1053   BILITOT 0.50 03/08/2014 0950         Impression and Plan: [name] is 54 year old white female with a recurrent granulosa cell tumor. She underwent debulking.She did have adjuvant chemotherapy. She subsequently had another recurrence. She had this resected. This was resected back on February of 2015.  So far, everything looks good.  We are following her Inhibin B level.  Will plan to get her back now in about 6 was months. I think this would be reasonable.   Volanda Napoleon, MD 8/12/201612:55 PM

## 2015-02-09 LAB — COMPREHENSIVE METABOLIC PANEL
ALT: 10 U/L (ref 6–29)
AST: 15 U/L (ref 10–35)
Albumin: 4 g/dL (ref 3.6–5.1)
Alkaline Phosphatase: 77 U/L (ref 33–130)
BUN: 14 mg/dL (ref 7–25)
CHLORIDE: 100 mmol/L (ref 98–110)
CO2: 19 mmol/L — ABNORMAL LOW (ref 20–31)
Calcium: 9.2 mg/dL (ref 8.6–10.4)
Creatinine, Ser: 1.37 mg/dL — ABNORMAL HIGH (ref 0.50–1.05)
GLUCOSE: 94 mg/dL (ref 65–99)
Potassium: 4.1 mmol/L (ref 3.5–5.3)
SODIUM: 134 mmol/L — AB (ref 135–146)
Total Bilirubin: 0.3 mg/dL (ref 0.2–1.2)
Total Protein: 6.3 g/dL (ref 6.1–8.1)

## 2015-02-09 LAB — INHIBIN B: Inhibin B: 40 pg/mL

## 2015-02-15 ENCOUNTER — Telehealth: Payer: Self-pay | Admitting: *Deleted

## 2015-02-15 ENCOUNTER — Telehealth: Payer: Self-pay | Admitting: Hematology & Oncology

## 2015-02-15 ENCOUNTER — Other Ambulatory Visit: Payer: Self-pay | Admitting: *Deleted

## 2015-02-15 DIAGNOSIS — D3910 Neoplasm of uncertain behavior of unspecified ovary: Secondary | ICD-10-CM

## 2015-02-15 NOTE — Telephone Encounter (Signed)
Attempted to contact pt but phone just rings, mailed out calendar and insturctions

## 2015-02-15 NOTE — Telephone Encounter (Signed)
Patient viewed her inhibin B results on MyChart and was concerned with the jump in level from 6 months ago. Relayed concerns to Dr Marin Olp who want patient to be re-scanned to ensure that rising levels aren't due to recurrence of  granulosa cell tumor. Orders placed and patient notified.

## 2015-03-01 ENCOUNTER — Other Ambulatory Visit (HOSPITAL_BASED_OUTPATIENT_CLINIC_OR_DEPARTMENT_OTHER): Payer: 59

## 2015-03-08 ENCOUNTER — Ambulatory Visit (HOSPITAL_BASED_OUTPATIENT_CLINIC_OR_DEPARTMENT_OTHER)
Admission: RE | Admit: 2015-03-08 | Discharge: 2015-03-08 | Disposition: A | Discharge status: Home / Self Care | Payer: 59 | Source: Ambulatory Visit | Attending: Hematology & Oncology | Admitting: Hematology & Oncology

## 2015-03-08 ENCOUNTER — Encounter (HOSPITAL_BASED_OUTPATIENT_CLINIC_OR_DEPARTMENT_OTHER): Payer: Self-pay

## 2015-03-08 DIAGNOSIS — D3910 Neoplasm of uncertain behavior of unspecified ovary: Secondary | ICD-10-CM | POA: Insufficient documentation

## 2015-03-08 MED ORDER — IOHEXOL 300 MG/ML  SOLN
75.0000 mL | Freq: Once | INTRAMUSCULAR | Status: AC | PRN
  Administered 2015-03-08: 75 mL via INTRAVENOUS

## 2015-03-10 ENCOUNTER — Encounter: Payer: Self-pay | Admitting: Gynecology

## 2015-03-10 ENCOUNTER — Ambulatory Visit: Payer: 59 | Attending: Gynecology | Admitting: Gynecology

## 2015-03-10 VITALS — BP 104/72 | HR 76 | Temp 97.7°F | Resp 18 | Ht 65.0 in | Wt 225.8 lb

## 2015-03-10 DIAGNOSIS — C569 Malignant neoplasm of unspecified ovary: Secondary | ICD-10-CM | POA: Insufficient documentation

## 2015-03-10 MED ORDER — LETROZOLE 2.5 MG PO TABS: 2.5000 mg | ORAL_TABLET | Freq: Every day | ORAL | Status: DC

## 2015-03-10 NOTE — Progress Notes (Signed)
Consult Note: Gyn-Onc   [name] 54 y.o. female  No chief complaint on file.   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   Plan: Surgical resection of the abdominal nodules versus other treatment were discussed with the patient. Given that these nodules were small and asymptomatic, I would recommend we treat with letrozole 2.5 mg daily for the next 3 months. At that 3 month anniversary, which just the patient have an inhibin B obtained as well as a repeat CT scan of the chest abdomen and pelvis. The patient will return to [name] care and I look forward to learning of the results in 3 months. Certainly if she continues to have significant progression then surgical resection should be reconsidered.   Interval history:  The patient returns today because of findings of recurrent granulosa cell tumor. The patient was seen for routine visit on 02/03/2015. At that time. Inhibin B was obtained and returned as 40 (previously the value was 20 patient subsequently was evaluated with a CT scan showing new soft tissue lesions in the right paracolic gutter measuring 2.1 x 1.6 cm and 1.4 x 1.4 cm. In addition the left pelvis was a 1.5 cm soft tissue mass. Further, there is a 7 mm lesion in the right upper lobe of the lung (previously 3 mm) and a 6 mm left upper lobe nodule (previously 4 mm) there is no pleural effusions.  The patient herself is entirely asymptomatic she specifically denies any GI or GU symptoms any abdominal pain pressure or bloating. She does not have a cough or any significant pulmonary symptoms.       HPI: 54 year old white female seen in initial consultation request of [name] regarding management of recurrent granulosa cell tumor. Patient first underwent surgery in 2007 in [place] finding a stage I C. granulosa cell tumor. She received 3 cycles of bleomycin, etoposide, and cisplatin chemotherapy as  an adjunct.  The patient was not compliant with follow-up and subsequently presented in June 2014 with a relatively large abdominal mass.  A CT scan was obtained showing a large mass in the anterior abdominal wall as well as 2 other masses in the pelvis. The anterior mass measures 9.5 x 10 x 11.5 cm with cystic and solid components. In addition there 2 masses in the pelvis measuring 5.3 x 4.3 and 5.5 x 5.0 cm. A fine-needle biopsy of the mass is then obtained confirming recurrent granulosa cell tumor. The mass seemed to extend into the anterior abdominal wall in the pelvis.  She underwent radical debulking of her recurrent granulosa cell tumor on 01/20/2013. All disease was resected. This required a low rectal anastomosis and resection of distal ureter with reimplantation.  Postoperatively, adjuvant therapy was recommended using carboplatin and Taxol. She received 4 cycles but did not tolerated very well and therefore discontinued. He CT scan in January 2015 showed a subcutaneous nodule in the lower abdomen and the inhibin B was 39 units per mL.  Imaging revealed a 3 cm right inguinal lymph node which was resected on 07/27/2013.   Review of Systems:10 point review of systems is negative except as noted in interval history.   Vitals: Blood pressure 104/72, pulse 76, temperature 97.7 F (36.5 C), temperature source Oral, resp. rate 18, height 5\' 5"  (1.651 m), weight 225 lb 12.8 oz (102.422 kg), SpO2 99 %.  Physical Exam: General : The patient is a healthy woman in no acute distress.  HEENT:  normocephalic, extraoccular movements normal; neck is supple without thyromegally  Lynphnodes: Supraclavicular and inguinal nodes not enlarged right inguinal incision is well-healed. Abdomen: Soft, non-tender, no ascites, no organomegally, midline incision is well-healed.  Pelvic:  EGBUS: Normal  Vagina: Normal no lesions are noted  Cervix and uterus are surgically absent  Bimanual and rectovaginal exam  revealed no masses induration or nodularity.   Lower extremities: No edema or varicosities. Normal range of motion      Allergies  Allergen Reactions  . Prochlorperazine Edisylate Other (See Comments)    Nervous/ flutter/ shakes--compazine  . Adhesive [Tape] Other (See Comments)    Redness/hives from adhesive tape( tolerates latex gloves); tolerates paper tape    Past Medical History  Diagnosis Date  . Arrhythmia     ventricular tachycardia  . Unspecified systolic heart failure   . Ventricular fibrillation   . ICD (implantable cardiac defibrillator) in place   . Pacemaker   . High cholesterol   . CHF (congestive heart failure)   . Myocardial infarction 2011  . Hypothyroidism   . Arthritis 02/06/2012    "everywhere"  . Bladder cancer 2009    "injected medicine to get rid of it"  . Granulosa cell carcinoma of ovary 2007    right; "had chemo"  . Pneumonia     as child  . Neuropathy     FEET AND HANDS - FROM CHEMO - BUT MUCH IMPROVED  . Port-a-cath in place     RIGHT UPPER CHEST  . Pain     JOINT PAINS AND MUSCLE ACHES ALL OVER.  Marland Kitchen Automatic implantable cardioverter-defibrillator in situ     DR. Beckie Salts   . Coronary artery disease     DR. Linard Millers  . Asthma     as a child    Past Surgical History  Procedure Laterality Date  . Cardiac defibrillator placement  02/06/2012    "lead change"  . Insert / replace / remove pacemaker  10/2009    [place]  . Tonsillectomy and adenoidectomy  ~ 1967  . Appendectomy  1989  . Vaginal hysterectomy  2001  . Tubal ligation  1993  . Bilateral oophorectomy  2007  . Mitral valve replacement  2011    "pig valve"  . Cardiac valve replacement    . Transurethral resection of bladder  2009    "for bladder cancer"  . Thymus removed    . Total hip arthroplasty  05/06/2012    Procedure: TOTAL HIP ARTHROPLASTY;  Surgeon: Gearlean Alf, MD;  Location: [facility];  Service: Orthopedics;  Laterality: Right;  .  Laparotomy N/A 01/19/2013    Procedure: TUMOR DEBULKING / BOWEL RESECTION/INSERTION RIGHT UTERERAL DOUBLE J STENT/OMENTECTOMY;  Surgeon: Alvino Chapel, MD;  Location: [facility];  Service: Gynecology;  Laterality: N/A;  . Cystoscopy Right 02/23/2013    Procedure: CYSTOSCOPY WITH STENT REMOVAL;  Surgeon: Alvino Chapel, MD;  Location: [facility];  Service: Gynecology;  Laterality: Right;  . Abdominal hysterectomy N/A 07/27/2013    Procedure: TUMOR EXCISION OF ABDOMINAL MASS;  Surgeon: Alvino Chapel, MD;  Location: [facility];  Service: Gynecology;  Laterality: N/A;  . Implantable cardioverter defibrillator revision N/A 02/06/2012    Procedure: IMPLANTABLE CARDIOVERTER DEFIBRILLATOR REVISION;  Surgeon: Evans Lance, MD;  Location: [facility] CATH LAB;  Service: Cardiovascular;  Laterality: N/A;    Current Outpatient Prescriptions  Medication Sig Dispense Refill  . aspirin 325 MG EC tablet Take 325 mg by mouth every  morning.     Marland Kitchen atorvastatin (LIPITOR) 40 MG tablet Take 1 tablet (40 mg total) by mouth every evening. 30 tablet 4  . b complex vitamins tablet Take 1 tablet by mouth daily.    . carvedilol (COREG) 12.5 MG tablet TAKE 1 TABLET (12.5 MG TOTAL) BY MOUTH 2 (TWO) TIMES DAILY WITH A MEAL. 60 tablet 3  . cholecalciferol (VITAMIN D) 1000 UNITS tablet Take 1,000 Units by mouth 2 (two) times daily.    . diphenhydrAMINE (BENADRYL) 25 MG tablet Take 25 mg by mouth as needed for itching or allergies. ALLERGIES    . Evening Primrose Oil 1000 MG CAPS Take 1,000 mg by mouth 2 (two) times daily.    . fish oil-omega-3 fatty acids 1000 MG capsule Take 1 g by mouth 2 (two) times daily.    Marland Kitchen levothyroxine (SYNTHROID, LEVOTHROID) 75 MCG tablet TAKE 1 TABLET (75 MCG TOTAL) BY MOUTH DAILY BEFORE BREAKFAST. 30 tablet 3  . LORazepam (ATIVAN) 0.5 MG tablet Take 1 tablet (0.5 mg total) by mouth every 6 (six) hours as needed (Nausea or vomiting). 60 tablet 2  . Multiple Vitamins-Minerals (MULTIVITAMIN PO)  Take 1 tablet by mouth daily.     Marland Kitchen spironolactone (ALDACTONE) 25 MG tablet TAKE 1 TABLET (25 MG TOTAL) BY MOUTH 2 (TWO) TIMES DAILY. 60 tablet 3  . zolpidem (AMBIEN) 5 MG tablet Take 1 tablet (5 mg total) by mouth at bedtime. 30 tablet 2   No current facility-administered medications for this visit.    Social History   Social History  . Marital Status: Divorced    Spouse Name: N/A  . Number of Children: N/A  . Years of Education: N/A   Occupational History  .      WORKS FULL TIME   Social History Main Topics  . Smoking status: Current Some Day Smoker -- 0.50 packs/day for 30 years    Types: Cigarettes    Start date: 03/28/1974  . Smokeless tobacco: Never Used     Comment:  10-27-13  still smoking daily  . Alcohol Use: No  . Drug Use: No  . Sexual Activity: Not Currently   Other Topics Concern  . Not on file   Social History Narrative   WORKS FULL TIME   SINGLE   TOBACCO USE-YES   IMPLANTATION OF DUAL-CHAMBER [place]    History reviewed. No pertinent family history.    Alvino Chapel, MD 03/10/2015, 4:19 PM

## 2015-03-10 NOTE — Patient Instructions (Signed)
Begin taking letrozole 2.5 mg daily.  Plan to follow up with Dr. Marin Olp in three months or sooner if needed.  Please call for any questions or concerns.  Letrozole tablets What is this medicine? LETROZOLE (LET roe zole) blocks the production of estrogen. Certain types of breast cancer grow under the influence of estrogen. Letrozole helps block tumor growth. This medicine is used to treat advanced breast cancer in postmenopausal women. This medicine may be used for other purposes; ask your health care provider or pharmacist if you have questions. COMMON BRAND NAME(S): Femara What should I tell my health care provider before I take this medicine? They need to know if you have any of these conditions: -liver disease -osteoporosis (weak bones) -an unusual or allergic reaction to letrozole, other medicines, foods, dyes, or preservatives -pregnant or trying to get pregnant -breast-feeding How should I use this medicine? Take this medicine by mouth with a glass of water. You may take it with or without food. Follow the directions on the prescription label. Take your medicine at regular intervals. Do not take your medicine more often than directed. Do not stop taking except on your doctor's advice. Talk to your pediatrician regarding the use of this medicine in children. Special care may be needed. Overdosage: If you think you have taken too much of this medicine contact a poison control center or emergency room at once. NOTE: This medicine is only for you. Do not share this medicine with others. What if I miss a dose? If you miss a dose, take it as soon as you can. If it is almost time for your next dose, take only that dose. Do not take double or extra doses. What may interact with this medicine? Do not take this medicine with any of the following medications: -estrogens, like hormone replacement therapy or birth control pills This medicine may also interact with the following  medications: -dietary supplements such as androstenedione or DHEA -prasterone -tamoxifen This list may not describe all possible interactions. Give your health care provider a list of all the medicines, herbs, non-prescription drugs, or dietary supplements you use. Also tell them if you smoke, drink alcohol, or use illegal drugs. Some items may interact with your medicine. What should I watch for while using this medicine? Visit your doctor or health care professional for regular check-ups to monitor your condition. Do not use this drug if you are pregnant. Serious side effects to an unborn child are possible. Talk to your doctor or pharmacist for more information. You may get drowsy or dizzy. Do not drive, use machinery, or do anything that needs mental alertness until you know how this medicine affects you. Do not stand or sit up quickly, especially if you are an older patient. This reduces the risk of dizzy or fainting spells. What side effects may I notice from receiving this medicine? Side effects that you should report to your doctor or health care professional as soon as possible: -allergic reactions like skin rash, itching, or hives -bone fracture -chest pain -difficulty breathing or shortness of breath -severe pain, swelling, warmth in the leg -unusually weak or tired -vaginal bleeding Side effects that usually do not require medical attention (report to your doctor or health care professional if they continue or are bothersome): -bone, back, joint, or muscle pain -dizziness -fatigue -fluid retention -headache -hot flashes, night sweats -nausea -weight gain This list may not describe all possible side effects. Call your doctor for medical advice about side effects. You may  report side effects to FDA at 1-800-FDA-1088. Where should I keep my medicine? Keep out of the reach of children. Store between 15 and 30 degrees C (59 and 86 degrees F). Throw away any unused medicine after  the expiration date. NOTE: This sheet is a summary. It may not cover all possible information. If you have questions about this medicine, talk to your doctor, pharmacist, or health care provider.  2015, Elsevier/Gold Standard. (2007-08-21 16:43:44)

## 2015-03-15 ENCOUNTER — Telehealth: Payer: Self-pay | Admitting: Hematology & Oncology

## 2015-03-15 ENCOUNTER — Other Ambulatory Visit: Payer: Self-pay | Admitting: Hematology & Oncology

## 2015-03-15 DIAGNOSIS — D3910 Neoplasm of uncertain behavior of unspecified ovary: Secondary | ICD-10-CM

## 2015-03-15 NOTE — Telephone Encounter (Signed)
Called patient's cell phone. Talked to patient. Notified patient of her upcoming appt for next month.       [facility].

## 2015-03-30 ENCOUNTER — Ambulatory Visit (INDEPENDENT_AMBULATORY_CARE_PROVIDER_SITE_OTHER): Payer: 59 | Admitting: Internal Medicine

## 2015-03-30 ENCOUNTER — Encounter: Payer: Self-pay | Admitting: Internal Medicine

## 2015-03-30 VITALS — BP 118/84 | HR 87 | Ht 66.0 in | Wt 220.8 lb

## 2015-03-30 DIAGNOSIS — I4729 Other ventricular tachycardia: Secondary | ICD-10-CM

## 2015-03-30 DIAGNOSIS — I5022 Chronic systolic (congestive) heart failure: Secondary | ICD-10-CM | POA: Diagnosis not present

## 2015-03-30 DIAGNOSIS — I472 Ventricular tachycardia: Secondary | ICD-10-CM

## 2015-03-30 DIAGNOSIS — Z95810 Presence of automatic (implantable) cardiac defibrillator: Secondary | ICD-10-CM

## 2015-03-30 LAB — CUP PACEART INCLINIC DEVICE CHECK
Brady Statistic RV Percent Paced: 0.12 %
HIGH POWER IMPEDANCE MEASURED VALUE: 72 Ohm
Lead Channel Impedance Value: 437.5 Ohm
Lead Channel Pacing Threshold Amplitude: 1.25 V
Lead Channel Pacing Threshold Pulse Width: 0.5 ms
Lead Channel Pacing Threshold Pulse Width: 1.3 ms
MDC IDC MSMT BATTERY REMAINING LONGEVITY: 56.4 mo
MDC IDC MSMT LEADCHNL RA IMPEDANCE VALUE: 1550 Ohm
MDC IDC MSMT LEADCHNL RA PACING THRESHOLD AMPLITUDE: 0.5 V
MDC IDC MSMT LEADCHNL RA PACING THRESHOLD AMPLITUDE: 0.5 V
MDC IDC MSMT LEADCHNL RA PACING THRESHOLD PULSEWIDTH: 0.5 ms
MDC IDC MSMT LEADCHNL RA SENSING INTR AMPL: 4 mV
MDC IDC MSMT LEADCHNL RV PACING THRESHOLD AMPLITUDE: 1.25 V
MDC IDC MSMT LEADCHNL RV PACING THRESHOLD PULSEWIDTH: 1.3 ms
MDC IDC MSMT LEADCHNL RV SENSING INTR AMPL: 11.4 mV
MDC IDC PG SERIAL: 778164
MDC IDC SESS DTM: 20161006104442
MDC IDC SET LEADCHNL RA PACING AMPLITUDE: 2 V
MDC IDC SET LEADCHNL RV PACING AMPLITUDE: 2.5 V
MDC IDC SET LEADCHNL RV PACING PULSEWIDTH: 1.3 ms
MDC IDC SET LEADCHNL RV SENSING SENSITIVITY: 0.5 mV
MDC IDC SET ZONE DETECTION INTERVAL: 315 ms
MDC IDC STAT BRADY RA PERCENT PACED: 0.21 %
Zone Setting Detection Interval: 260 ms
Zone Setting Detection Interval: 400 ms

## 2015-03-30 MED ORDER — FUROSEMIDE 20 MG PO TABS: 20.0000 mg | ORAL_TABLET | Freq: Every day | ORAL | Status: DC

## 2015-03-30 NOTE — Assessment & Plan Note (Addendum)
Her symptoms appear to be a bit worse. She has not changed her medications. Her echo 10 months ago unrevealing except for chronic LV dysfunction. Her ECG demonstrates no worsening of her QRS duration. Will follow. She will maintain a low sodium diet. I would like her to start lasix 20 mg daily. Will check a BMP.

## 2015-03-30 NOTE — Assessment & Plan Note (Signed)
Her [employer] Jude ICD is working normally except for some occaisional atrial lead noise.

## 2015-03-30 NOTE — Assessment & Plan Note (Signed)
She has been off of the amio for over a year and has had no recurrent VT. Will follow.

## 2015-03-30 NOTE — Progress Notes (Signed)
HPI [name] returns today for followup. She is a very pleasant 54 year old woman with a nonischemic cardiomyopathy, chronic systolic heart failure, status post ICD implantation. She has class II heart failure symptoms. In the interim, she has noted worsening sob. She stopped amiodarone last year. No edema. No ICD therapies. Allergies  Allergen Reactions  . Prochlorperazine Edisylate Other (See Comments)    Nervous/ flutter/ shakes--compazine  . Adhesive [Tape] Other (See Comments)    Redness/hives from adhesive tape( tolerates latex gloves); tolerates paper tape     Current Outpatient Prescriptions  Medication Sig Dispense Refill  . aspirin 325 MG EC tablet Take 325 mg by mouth every morning.     Marland Kitchen atorvastatin (LIPITOR) 40 MG tablet Take 1 tablet (40 mg total) by mouth every evening. 30 tablet 4  . b complex vitamins tablet Take 1 tablet by mouth daily.    . carvedilol (COREG) 12.5 MG tablet TAKE 1 TABLET (12.5 MG TOTAL) BY MOUTH 2 (TWO) TIMES DAILY WITH A MEAL. 60 tablet 3  . cholecalciferol (VITAMIN D) 1000 UNITS tablet Take 1,000 Units by mouth 2 (two) times daily.    . diphenhydrAMINE (BENADRYL) 25 MG tablet Take 25 mg by mouth as needed for itching or allergies. ALLERGIES    . Evening Primrose Oil 1000 MG CAPS Take 1,000 mg by mouth 2 (two) times daily.    . fish oil-omega-3 fatty acids 1000 MG capsule Take 1 g by mouth 2 (two) times daily.    Marland Kitchen letrozole (FEMARA) 2.5 MG tablet Take 1 tablet (2.5 mg total) by mouth daily. 90 tablet 3  . levothyroxine (SYNTHROID, LEVOTHROID) 75 MCG tablet TAKE 1 TABLET (75 MCG TOTAL) BY MOUTH DAILY BEFORE BREAKFAST. 30 tablet 3  . LORazepam (ATIVAN) 0.5 MG tablet Take 1 tablet (0.5 mg total) by mouth every 6 (six) hours as needed (Nausea or vomiting). 60 tablet 2  . Multiple Vitamins-Minerals (MULTIVITAMIN PO) Take 1 tablet by mouth daily.     Marland Kitchen spironolactone (ALDACTONE) 25 MG tablet TAKE 1 TABLET (25 MG TOTAL) BY MOUTH 2 (TWO) TIMES DAILY. 60 tablet  3  . zolpidem (AMBIEN) 5 MG tablet Take 1 tablet (5 mg total) by mouth at bedtime. 30 tablet 2   No current facility-administered medications for this visit.     Past Medical History  Diagnosis Date  . Arrhythmia     ventricular tachycardia  . Unspecified systolic heart failure   . Ventricular fibrillation ([place])   . ICD (implantable cardiac defibrillator) in place   . Pacemaker   . High cholesterol   . CHF (congestive heart failure) ([place])   . Myocardial infarction ([name]) 2011  . Hypothyroidism   . Arthritis 02/06/2012    "everywhere"  . Bladder cancer [facility]) 2009    "injected medicine to get rid of it"  . Granulosa cell carcinoma of ovary ([place]) 2007    right; "had chemo"  . Pneumonia     as child  . Neuropathy ([facility])     FEET AND HANDS - FROM CHEMO - BUT MUCH IMPROVED  . Port-a-cath in place     RIGHT UPPER CHEST  . Pain     JOINT PAINS AND MUSCLE ACHES ALL OVER.  Marland Kitchen Automatic implantable cardioverter-defibrillator in situ     DR. Beckie Salts   . Coronary artery disease     DR. Linard Millers  . Asthma     as a child    ROS:   All systems reviewed and negative except as  noted in the HPI.   Past Surgical History  Procedure Laterality Date  . Cardiac defibrillator placement  02/06/2012    "lead change"  . Insert / replace / remove pacemaker  10/2009    [place]  . Tonsillectomy and adenoidectomy  ~ 1967  . Appendectomy  1989  . Vaginal hysterectomy  2001  . Tubal ligation  1993  . Bilateral oophorectomy  2007  . Mitral valve replacement  2011    "pig valve"  . Cardiac valve replacement    . Transurethral resection of bladder  2009    "for bladder cancer"  . Thymus removed    . Total hip arthroplasty  05/06/2012    Procedure: TOTAL HIP ARTHROPLASTY;  Surgeon: Gearlean Alf, MD;  [facility]: [facility];  Service: Orthopedics;  Laterality: Right;  . Laparotomy N/A 01/19/2013    Procedure: TUMOR DEBULKING / BOWEL RESECTION/INSERTION RIGHT UTERERAL  DOUBLE J STENT/OMENTECTOMY;  Surgeon: Alvino Chapel, MD;  Location: [facility];  Service: Gynecology;  Laterality: N/A;  . Cystoscopy Right 02/23/2013    Procedure: CYSTOSCOPY WITH STENT REMOVAL;  Surgeon: Alvino Chapel, MD;  [facility]: [facility];  Service: Gynecology;  Laterality: Right;  . Abdominal hysterectomy N/A 07/27/2013    Procedure: TUMOR EXCISION OF ABDOMINAL MASS;  Surgeon: Alvino Chapel, MD;  [facility]: [facility];  Service: Gynecology;  Laterality: N/A;  . Implantable cardioverter defibrillator revision N/A 02/06/2012    Procedure: IMPLANTABLE CARDIOVERTER DEFIBRILLATOR REVISION;  Surgeon: Evans Lance, MD;  [facility] C[facility] LAB;  Service: Cardiovascular;  Laterality: N/A;     No family history on file.   Social History   Social History  . Marital Status: Divorced    Spouse Name: N/A  . Number of Children: N/A  . Years of Education: N/A   Occupational History  .      WORKS FULL TIME   Social History Main Topics  . Smoking status: Current Some Day Smoker -- 0.50 packs/day for 30 years    Types: Cigarettes    Start date: 03/28/1974  . Smokeless tobacco: Never Used     Comment:  10-27-13  still smoking daily  . Alcohol Use: No  . Drug Use: No  . Sexual Activity: Not Currently   Other Topics Concern  . Not on file   Social History Narrative   WORKS FULL TIME   SINGLE   TOBACCO USE-YES   IMPLANTATION OF DUAL-CHAMBER [employer] DEFIBRILLATOR     BP 118/84 mmHg  Pulse 87  Ht 5\' 6"  (1.676 m)  Wt 220 lb 12.8 oz (100.154 kg)  BMI 35.65 kg/m2  Physical Exam:  Well appearing middle-age woman,NAD HEENT: Unremarkable Neck:  No JVD, no thyromegally Back:  No CVA tenderness Lungs:  Clear with no wheezes, rales, or rhonchi. Right chest has a healing Port-A-Cath HEART:  Regular rate rhythm, no murmurs, no rubs, no clicks Abd:  soft, positive bowel sounds, no organomegally, no rebound, no guarding Ext:  2 plus pulses, no edema, no cyanosis, no  clubbing Skin:  No rashes no nodules Neuro:  CN II through XII intact, motor grossly intact  DEVICE  Normal device function.  See PaceArt for details.   Assess/Plan:

## 2015-03-30 NOTE — Patient Instructions (Signed)
Medication Instructions:   START FUROSEMIDE 20 MG ONCE DAILY  Labwork:  IN ONE WEEK  Follow-Up:  Your physician wants you to follow-up in: [place] will receive a reminder letter in the mail two months in advance. If you don't receive a letter, please call our office to schedule the follow-up appointment.

## 2015-04-04 ENCOUNTER — Other Ambulatory Visit: Payer: 59

## 2015-04-11 ENCOUNTER — Other Ambulatory Visit: Payer: 59

## 2015-04-11 ENCOUNTER — Ambulatory Visit: Payer: 59 | Admitting: Hematology & Oncology

## 2015-04-11 ENCOUNTER — Other Ambulatory Visit (INDEPENDENT_AMBULATORY_CARE_PROVIDER_SITE_OTHER): Payer: 59

## 2015-04-11 DIAGNOSIS — I5022 Chronic systolic (congestive) heart failure: Secondary | ICD-10-CM | POA: Diagnosis not present

## 2015-04-11 DIAGNOSIS — I472 Ventricular tachycardia: Secondary | ICD-10-CM

## 2015-04-11 DIAGNOSIS — Z95810 Presence of automatic (implantable) cardiac defibrillator: Secondary | ICD-10-CM

## 2015-04-11 DIAGNOSIS — I4729 Other ventricular tachycardia: Secondary | ICD-10-CM

## 2015-04-11 LAB — BASIC METABOLIC PANEL
BUN: 20 mg/dL (ref 7–25)
CALCIUM: 9.6 mg/dL (ref 8.6–10.4)
CHLORIDE: 99 mmol/L (ref 98–110)
CO2: 20 mmol/L (ref 20–31)
CREATININE: 1.42 mg/dL — AB (ref 0.50–1.05)
GLUCOSE: 100 mg/dL — AB (ref 65–99)
Potassium: 3.9 mmol/L (ref 3.5–5.3)
Sodium: 134 mmol/L — ABNORMAL LOW (ref 135–146)

## 2015-04-26 ENCOUNTER — Other Ambulatory Visit (HOSPITAL_BASED_OUTPATIENT_CLINIC_OR_DEPARTMENT_OTHER): Payer: 59

## 2015-04-26 ENCOUNTER — Ambulatory Visit (HOSPITAL_BASED_OUTPATIENT_CLINIC_OR_DEPARTMENT_OTHER): Payer: 59 | Admitting: Hematology & Oncology

## 2015-04-26 ENCOUNTER — Encounter: Payer: Self-pay | Admitting: Hematology & Oncology

## 2015-04-26 VITALS — BP 98/64 | HR 80 | Temp 97.4°F | Wt 218.1 lb

## 2015-04-26 DIAGNOSIS — R928 Other abnormal and inconclusive findings on diagnostic imaging of breast: Secondary | ICD-10-CM

## 2015-04-26 DIAGNOSIS — G4701 Insomnia due to medical condition: Secondary | ICD-10-CM

## 2015-04-26 DIAGNOSIS — R918 Other nonspecific abnormal finding of lung field: Secondary | ICD-10-CM

## 2015-04-26 DIAGNOSIS — D3910 Neoplasm of uncertain behavior of unspecified ovary: Secondary | ICD-10-CM

## 2015-04-26 DIAGNOSIS — C569 Malignant neoplasm of unspecified ovary: Secondary | ICD-10-CM

## 2015-04-26 DIAGNOSIS — M899 Disorder of bone, unspecified: Secondary | ICD-10-CM | POA: Diagnosis not present

## 2015-04-26 DIAGNOSIS — Z720 Tobacco use: Secondary | ICD-10-CM | POA: Diagnosis not present

## 2015-04-26 DIAGNOSIS — M8580 Other specified disorders of bone density and structure, unspecified site: Secondary | ICD-10-CM

## 2015-04-26 LAB — COMPREHENSIVE METABOLIC PANEL (CC13)
ALT: 17 U/L (ref 0–55)
AST: 17 U/L (ref 5–34)
Albumin: 3.8 g/dL (ref 3.5–5.0)
Alkaline Phosphatase: 80 U/L (ref 40–150)
Anion Gap: 9 mEq/L (ref 3–11)
BUN: 14.6 mg/dL (ref 7.0–26.0)
CALCIUM: 10 mg/dL (ref 8.4–10.4)
CHLORIDE: 107 meq/L (ref 98–109)
CO2: 22 meq/L (ref 22–29)
CREATININE: 1.3 mg/dL — AB (ref 0.6–1.1)
EGFR: 45 mL/min/{1.73_m2} — ABNORMAL LOW (ref >90)
GLUCOSE: 96 mg/dL (ref 70–140)
POTASSIUM: 3.9 meq/L (ref 3.5–5.1)
SODIUM: 138 meq/L (ref 136–145)
Total Bilirubin: <0.3 mg/dL (ref 0.20–1.20)
Total Protein: 7 g/dL (ref 6.4–8.3)

## 2015-04-26 LAB — CBC WITH DIFFERENTIAL (CANCER CENTER ONLY)
BASO#: 0 10*3/uL (ref 0.0–0.2)
BASO%: 0.3 % (ref 0.0–2.0)
EOS%: 1.8 % (ref 0.0–7.0)
Eosinophils Absolute: 0.2 10*3/uL (ref 0.0–0.5)
HCT: 37.8 % (ref 34.8–46.6)
HGB: 12.9 g/dL (ref 11.6–15.9)
LYMPH#: 2.2 10*3/uL (ref 0.9–3.3)
LYMPH%: 21.1 % (ref 14.0–48.0)
MCH: 31.9 pg (ref 26.0–34.0)
MCHC: 34.1 g/dL (ref 32.0–36.0)
MCV: 94 fL (ref 81–101)
MONO#: 1 10*3/uL — AB (ref 0.1–0.9)
MONO%: 10.1 % (ref 0.0–13.0)
NEUT%: 66.7 % (ref 39.6–80.0)
NEUTROS ABS: 6.8 10*3/uL — AB (ref 1.5–6.5)
PLATELETS: 175 10*3/uL (ref 145–400)
RBC: 4.04 10*6/uL (ref 3.70–5.32)
RDW: 13.1 % (ref 11.1–15.7)
WBC: 10.2 10*3/uL — ABNORMAL HIGH (ref 3.9–10.0)

## 2015-04-26 MED ORDER — ZOLPIDEM TARTRATE 5 MG PO TABS: 5.0000 mg | ORAL_TABLET | Freq: Every day | ORAL | Status: DC

## 2015-04-26 NOTE — Progress Notes (Signed)
Hematology and Oncology Follow Up Visit  ODYSSEY VASBINDER [id number] [birth year] 54 y.o. 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  [name] is back for followup. She, unfortunately, has another episode of recurrence of her cancer. We last saw her, her inhibin B level was up to 40.  We went ahead and got scans on her. The scans showed that she had interval development of soft tissue masses within the right paracolic gutter. Also noted was a new soft tissue mass in the left aspect of the pelvis. This measured 1.5 cm. In the paracolic gutters, one mass measuring 2.1 x 1.6 cm and the other measuring 1.4 x 1.4 cm.  A CT of the chest showed a 7 mm right upper lobe nodule. A 6 mm left upper lobe nodule was also noted.  We referred her back to her surgical oncologist. Dr. Carlis Abbott- Dianah Field did not feel that there is any indication for surgical resection. He recommended that she start Femara. This was a great idea. I thought this would be very reasonable for her. She is doing well on Femara.  A concern for his now is that she had a mammogram done in October. This apparently showed some type of abnormality over in the left breast. She is concerned that recommendation was for 6 month follow-up. I agree with this. Because she cannot have an MRI secondary to her pacemaker, I felt that a mammogram in 3 months would be reasonable.  She is working. She unfortunately still smoking but smokes less than 10 cigarettes a day.  She's not having any problems with bowels or bladder. She's having no abdominal pain. There is no cough. She's had no leg swelling. She's had no rashes.  Overall, her performance status is ECOG 1. Overall, her performance status is ECOG 0.  Medications:  Current outpatient prescriptions:  .  aspirin 325 MG EC tablet, Take 325 mg by mouth every morning. , Disp: , Rfl:  .   atorvastatin (LIPITOR) 40 MG tablet, Take 1 tablet (40 mg total) by mouth every evening., Disp: 30 tablet, Rfl: 4 .  b complex vitamins tablet, Take 1 tablet by mouth daily., Disp: , Rfl:  .  carvedilol (COREG) 12.5 MG tablet, TAKE 1 TABLET (12.5 MG TOTAL) BY MOUTH 2 (TWO) TIMES DAILY WITH A MEAL., Disp: 60 tablet, Rfl: 3 .  cholecalciferol (VITAMIN D) 1000 UNITS tablet, Take 1,000 Units by mouth 2 (two) times daily., Disp: , Rfl:  .  diphenhydrAMINE (BENADRYL) 25 MG tablet, Take 25 mg by mouth as needed for itching or allergies. ALLERGIES, Disp: , Rfl:  .  Evening Primrose Oil 1000 MG CAPS, Take 1,000 mg by mouth 2 (two) times daily., Disp: , Rfl:  .  fish oil-omega-3 fatty acids 1000 MG capsule, Take 1 g by mouth 2 (two) times daily., Disp: , Rfl:  .  furosemide (LASIX) 20 MG tablet, Take 1 tablet (20 mg total) by mouth daily., Disp: 90 tablet, Rfl: 3 .  letrozole (FEMARA) 2.5 MG tablet, Take 1 tablet (2.5 mg total) by mouth daily., Disp: 90 tablet, Rfl: 3 .  levothyroxine (SYNTHROID, LEVOTHROID) 75 MCG tablet, TAKE 1 TABLET (75 MCG TOTAL) BY MOUTH DAILY BEFORE BREAKFAST., Disp: 30 tablet, Rfl: 3 .  LORazepam (ATIVAN) 0.5 MG tablet, Take 1 tablet (0.5 mg total) by mouth every 6 (six) hours as needed (  Nausea or vomiting)., Disp: 60 tablet, Rfl: 2 .  Multiple Vitamins-Minerals (MULTIVITAMIN PO), Take 1 tablet by mouth daily. , Disp: , Rfl:  .  spironolactone (ALDACTONE) 25 MG tablet, TAKE 1 TABLET (25 MG TOTAL) BY MOUTH 2 (TWO) TIMES DAILY., Disp: 60 tablet, Rfl: 3 .  zolpidem (AMBIEN) 5 MG tablet, Take 1 tablet (5 mg total) by mouth at bedtime., Disp: 30 tablet, Rfl: 2  Allergies:  Allergies  Allergen Reactions  . Prochlorperazine Edisylate Other (See Comments)    Nervous/ flutter/ shakes--compazine  . Adhesive [Tape] Other (See Comments)    Redness/hives from adhesive tape( tolerates latex gloves); tolerates paper tape    Past Medical History, Surgical history, Social history, and Family  History were reviewed and updated.  Review of Systems: As above  Physical Exam:  weight is 218 lb 1.3 oz (98.92 kg). Her oral temperature is 97.4 F (36.3 C). Her blood pressure is 98/64 and her pulse is 80.   Well-developed well-nourished white female. Lungs are clear. Head and neck exam shows no ocular or oral lesion. There is no adenopathy. Lungs are clear. Cardiac exam regular rate and rhythm. She is a 1/6 systolic murmur. Abdomen is soft with well-healed laparotomy scars. She has no fluid wave. No palpable liver or spleen. There is no obvious abdominal mass. Back exam shows no tenderness over the spine ribs or hips. Extremities shows no clubbing cyanosis or edema. She has good range of motion of her joints. She has good strength in her muscle groups. Skin exam no rashes, ecchymoses or petechia. Neurological exam shows no focal neurological deficits.  Lab Results  Component Value Date   WBC 10.2* 04/26/2015   HGB 12.9 04/26/2015   HCT 37.8 04/26/2015   MCV 94 04/26/2015   PLT 175 04/26/2015     Chemistry      Component Value Date/Time   NA 134* 04/11/2015 1507   NA 142 03/08/2014 0950   NA 138 01/29/2013 1114   K 3.9 04/11/2015 1507   K 3.9 03/08/2014 0950   K 4.3 01/29/2013 1114   CL 99 04/11/2015 1507   CL 105 03/08/2014 0950   CO2 20 04/11/2015 1507   CO2 24 03/08/2014 0950   CO2 23 01/29/2013 1114   BUN 20 04/11/2015 1507   BUN 19 03/08/2014 0950   BUN 9.5 01/29/2013 1114   CREATININE 1.42* 04/11/2015 1507   CREATININE 1.37* 02/03/2015 0946   CREATININE 1.3* 01/29/2013 1114      Component Value Date/Time   CALCIUM 9.6 04/11/2015 1507   CALCIUM 10.2 03/08/2014 0950   CALCIUM 9.0 01/29/2013 1114   ALKPHOS 77 02/03/2015 0946   ALKPHOS 59 03/08/2014 0950   AST 15 02/03/2015 0946   AST 22 03/08/2014 0950   ALT 10 02/03/2015 0946   ALT 23 03/08/2014 0950   BILITOT 0.3 02/03/2015 0946   BILITOT 0.50 03/08/2014 0950         Impression and Plan: [name] is  54 year old white female with a recurrent granulosa cell tumor. She underwent debulking.She did have adjuvant chemotherapy. She subsequently had another recurrence. She had this resected. This was resected back on February of 2015.  I am just surprised that she has had another recurrence.  Hopefully, the Femara will help [place].  I will get her set up with scans in December. At that point, she would have been on Femara for 3 months.  As far as these lung nodules are concerned with her CT scan, I need  to keep in mind that with her tobacco use, it is conceivable that she may have a second malignancy. It is possible that if these lung nodules continue to increase in size, that we may have to get a biopsy to prove that she does not have bronchogenic carcinoma.  She does need a bone density test. I will get this set up for her in 3 weeks.  I refilled her Ambien.  I spent about 40-45 minutes with her today.    Volanda Napoleon, MD 11/2/201610:52 AM

## 2015-04-29 LAB — INHIBIN B: INHIBIN B: 52 pg/mL

## 2015-05-01 ENCOUNTER — Encounter: Payer: Self-pay | Admitting: Hematology & Oncology

## 2015-05-04 ENCOUNTER — Telehealth: Payer: Self-pay | Admitting: *Deleted

## 2015-05-04 NOTE — Telephone Encounter (Addendum)
Patient aware of results  ----- Message from Volanda Napoleon, MD sent at 05/04/2015  2:05 PM EST ----- Call - the Inhibin B level is still on the high side.  When we do the next scans, this will really tell us if the Femara is working.  It often can take 2 months for the Femara to start working.  pete

## 2015-05-16 ENCOUNTER — Telehealth: Payer: Self-pay | Admitting: Cardiology

## 2015-05-16 NOTE — Telephone Encounter (Signed)
Pt called in today stating that she only wants to check her ICD every 6 months. She said that she discussed this with MD at [facility] in 03-2014. I informed pt that I would ask MD if this ok and would call her back. Pt verbalized understanding.

## 2015-05-17 ENCOUNTER — Encounter: Payer: Self-pay | Admitting: *Deleted

## 2015-05-17 ENCOUNTER — Telehealth: Payer: Self-pay | Admitting: *Deleted

## 2015-05-17 NOTE — Telephone Encounter (Signed)
Spoke with patient about her family history and status.

## 2015-05-21 NOTE — Telephone Encounter (Signed)
Not recommended but her decision. GT

## 2015-05-22 ENCOUNTER — Ambulatory Visit (HOSPITAL_BASED_OUTPATIENT_CLINIC_OR_DEPARTMENT_OTHER)
Admission: RE | Admit: 2015-05-22 | Discharge: 2015-05-22 | Disposition: A | Discharge status: Home / Self Care | Payer: 59 | Source: Ambulatory Visit | Attending: Hematology & Oncology | Admitting: Hematology & Oncology

## 2015-05-22 DIAGNOSIS — R928 Other abnormal and inconclusive findings on diagnostic imaging of breast: Secondary | ICD-10-CM

## 2015-05-22 DIAGNOSIS — Z780 Asymptomatic menopausal state: Secondary | ICD-10-CM | POA: Diagnosis not present

## 2015-05-22 DIAGNOSIS — M899 Disorder of bone, unspecified: Secondary | ICD-10-CM | POA: Insufficient documentation

## 2015-05-22 DIAGNOSIS — E039 Hypothyroidism, unspecified: Secondary | ICD-10-CM | POA: Diagnosis not present

## 2015-05-22 DIAGNOSIS — M8580 Other specified disorders of bone density and structure, unspecified site: Secondary | ICD-10-CM | POA: Diagnosis not present

## 2015-05-22 DIAGNOSIS — F17200 Nicotine dependence, unspecified, uncomplicated: Secondary | ICD-10-CM | POA: Diagnosis not present

## 2015-05-22 DIAGNOSIS — G4701 Insomnia due to medical condition: Secondary | ICD-10-CM

## 2015-05-22 DIAGNOSIS — D3910 Neoplasm of uncertain behavior of unspecified ovary: Secondary | ICD-10-CM

## 2015-05-22 NOTE — Telephone Encounter (Signed)
Informed pt that MD stated that checking her ICD is not recommended but her decision. She stated that she only wants to follow her ICD every 6 months. Noted in [facility].

## 2015-05-23 ENCOUNTER — Other Ambulatory Visit (HOSPITAL_BASED_OUTPATIENT_CLINIC_OR_DEPARTMENT_OTHER): Payer: 59

## 2015-05-23 ENCOUNTER — Ambulatory Visit (INDEPENDENT_AMBULATORY_CARE_PROVIDER_SITE_OTHER): Payer: 59 | Admitting: Interventional Cardiology

## 2015-05-23 ENCOUNTER — Encounter: Payer: Self-pay | Admitting: Interventional Cardiology

## 2015-05-23 VITALS — BP 90/68 | HR 84 | Ht 66.0 in | Wt 218.0 lb

## 2015-05-23 DIAGNOSIS — Z09 Encounter for follow-up examination after completed treatment for conditions other than malignant neoplasm: Secondary | ICD-10-CM

## 2015-05-23 DIAGNOSIS — I472 Ventricular tachycardia: Secondary | ICD-10-CM

## 2015-05-23 DIAGNOSIS — I4729 Other ventricular tachycardia: Secondary | ICD-10-CM

## 2015-05-23 DIAGNOSIS — I5022 Chronic systolic (congestive) heart failure: Secondary | ICD-10-CM | POA: Diagnosis not present

## 2015-05-23 DIAGNOSIS — Z952 Presence of prosthetic heart valve: Secondary | ICD-10-CM

## 2015-05-23 DIAGNOSIS — Z954 Presence of other heart-valve replacement: Secondary | ICD-10-CM

## 2015-05-23 DIAGNOSIS — Z9229 Personal history of other drug therapy: Secondary | ICD-10-CM

## 2015-05-23 DIAGNOSIS — I25718 Atherosclerosis of autologous vein coronary artery bypass graft(s) with other forms of angina pectoris: Secondary | ICD-10-CM | POA: Diagnosis not present

## 2015-05-23 DIAGNOSIS — Z95810 Presence of automatic (implantable) cardiac defibrillator: Secondary | ICD-10-CM

## 2015-05-23 MED ORDER — SPIRONOLACTONE 25 MG PO TABS: ORAL_TABLET | ORAL | Status: DC

## 2015-05-23 MED ORDER — CARVEDILOL 12.5 MG PO TABS: ORAL_TABLET | ORAL | Status: DC

## 2015-05-23 NOTE — Progress Notes (Signed)
Cardiology Office Note   Date:  05/23/2015   ID:  [name], DOB 05/30/1961, MRN [id number]  PCP:  Stacy Ou, MD  Cardiologist:  Stacy Grooms, MD   Chief Complaint  Patient presents with  . Coronary Artery Disease      History of Present Illness: [name] is a 54 y.o. female who presents for prior history of large right coronary/right ventricular/inferior wall infarction with aneurysm and papillary muscle rupture requiring mitral valve replacement with tissue prosthesis, ventricular tachycardia, AICD, and ovarian cancer.  She found out in the past 12 months if she has recurrence of the granulosa cell ovarian cancer.  She has had no significant cardiac complaints. She specifically denies AICD discharge, syncope, angina, fluid accumulation, orthopnea, PND, and strokelike symptoms. No medication side effects. Recent laboratory data revealed normal potassium and kidney function.    Past Medical History  Diagnosis Date  . Arrhythmia     ventricular tachycardia  . Unspecified systolic heart failure   . Ventricular fibrillation ([place])   . ICD (implantable cardiac defibrillator) in place   . Pacemaker   . High cholesterol   . CHF (congestive heart failure) ([place])   . Myocardial infarction ([name]) 2011  . Hypothyroidism   . Arthritis 02/06/2012    "everywhere"  . Bladder cancer [facility]) 2009    "injected medicine to get rid of it"  . Granulosa cell carcinoma of ovary ([place]) 2007    right; "had chemo"  . Pneumonia     as child  . Neuropathy (HCC)     FEET AND HANDS - FROM CHEMO - BUT MUCH IMPROVED  . Port-a-cath in place     RIGHT UPPER CHEST  . Pain     JOINT PAINS AND MUSCLE ACHES ALL OVER.  Marland Kitchen Automatic implantable cardioverter-defibrillator in situ     [name]   . Coronary artery disease     [name]  . Asthma     as a child    Past Surgical History  Procedure Laterality Date  . Cardiac defibrillator placement  02/06/2012    "lead  change"  . Insert / replace / remove pacemaker  10/2009    [place]  . Tonsillectomy and adenoidectomy  ~ 1967  . Appendectomy  1989  . Vaginal hysterectomy  2001  . Tubal ligation  1993  . Bilateral oophorectomy  2007  . Mitral valve replacement  2011    "pig valve"  . Cardiac valve replacement    . Transurethral resection of bladder  2009    "for bladder cancer"  . Thymus removed    . Total hip arthroplasty  05/06/2012    Procedure: TOTAL HIP ARTHROPLASTY;  Surgeon: Stacy Alf, MD;  Location: [facility];  Service: Orthopedics;  Laterality: Right;  . Laparotomy N/A 01/19/2013    Procedure: TUMOR DEBULKING / BOWEL RESECTION/INSERTION RIGHT UTERERAL DOUBLE J STENT/OMENTECTOMY;  Surgeon: Stacy Chapel, MD;  Location: [facility];  Service: Gynecology;  Laterality: N/A;  . Cystoscopy Right 02/23/2013    Procedure: CYSTOSCOPY WITH STENT REMOVAL;  Surgeon: Stacy Chapel, MD;  Location: [facility];  Service: Gynecology;  Laterality: Right;  . Abdominal hysterectomy N/A 07/27/2013    Procedure: TUMOR EXCISION OF ABDOMINAL MASS;  Surgeon: Stacy Chapel, MD;  Location: [facility];  Service: Gynecology;  Laterality: N/A;  . Implantable cardioverter defibrillator revision N/A 02/06/2012    Procedure: IMPLANTABLE CARDIOVERTER DEFIBRILLATOR REVISION;  Surgeon: Stacy Mungo  Lovena Le, MD;  Location: [facility] C[facility] LAB;  Service: Cardiovascular;  Laterality: N/A;     Current Outpatient Prescriptions  Medication Sig Dispense Refill  . aspirin 325 MG EC tablet Take 325 mg by mouth every morning.     Marland Kitchen atorvastatin (LIPITOR) 40 MG tablet Take 1 tablet (40 mg total) by mouth every evening. 30 tablet 4  . b complex vitamins tablet Take 1 tablet by mouth daily.    . carvedilol (COREG) 12.5 MG tablet TAKE 1 TABLET (12.5 MG TOTAL) BY MOUTH 2 (TWO) TIMES DAILY WITH A MEAL. 60 tablet 3  . cholecalciferol (VITAMIN D-400) 400 UNITS TABS tablet Take 400 Units by mouth 2 (two) times daily.     . diphenhydrAMINE (BENADRYL) 25 MG tablet Take 25 mg by mouth as needed for itching or allergies. ALLERGIES    . fish oil-omega-3 fatty acids 1000 MG capsule Take 1 g by mouth 2 (two) times daily.    . furosemide (LASIX) 20 MG tablet Take 1 tablet (20 mg total) by mouth daily. 90 tablet 3  . letrozole (FEMARA) 2.5 MG tablet Take 1 tablet (2.5 mg total) by mouth daily. 90 tablet 3  . levothyroxine (SYNTHROID, LEVOTHROID) 75 MCG tablet TAKE 1 TABLET (75 MCG TOTAL) BY MOUTH DAILY BEFORE BREAKFAST. 30 tablet 3  . LORazepam (ATIVAN) 0.5 MG tablet Take 1 tablet (0.5 mg total) by mouth every 6 (six) hours as needed (Nausea or vomiting). 60 tablet 2  . Multiple Vitamins-Minerals (MULTIVITAMIN PO) Take 1 tablet by mouth daily.     Marland Kitchen spironolactone (ALDACTONE) 25 MG tablet TAKE 1 TABLET (25 MG TOTAL) BY MOUTH 2 (TWO) TIMES DAILY. 60 tablet 3  . zolpidem (AMBIEN) 5 MG tablet Take 1 tablet (5 mg total) by mouth at bedtime. 30 tablet 2   No current facility-administered medications for this visit.    Allergies:   Prochlorperazine edisylate and Adhesive    Social History:  The patient  reports that she has been smoking Cigarettes.  She started smoking about 41 years ago. She has a 15 pack-year smoking history. She has never used smokeless tobacco. She reports that she does not drink alcohol or use illicit drugs.   Family History:  The patient's family history includes Heart attack in her brother, maternal grandfather, and paternal grandfather; Stroke in her maternal grandmother.    ROS:  Please see the history of present illness.   Otherwise, review of systems are positive for recurrence of granulosis cell tumor of the ovary. Now taking letrozole for suppression..   All other systems are reviewed and negative.    PHYSICAL EXAM: VS:  BP 90/68 mmHg  Pulse 84  Ht 5\' 6"  (1.676 m)  Wt 218 lb (98.884 kg)  BMI 35.20 kg/m2  SpO2 97% , BMI Body mass index is 35.2 kg/(m^2). GEN: Well nourished, well  developed, in no acute distress HEENT: normal Neck: no JVD, carotid bruits, or masses Cardiac: RRR.  There is no murmur, rub, or gallop. There is no edema. Respiratory:  clear to auscultation bilaterally, normal work of breathing. GI: soft, nontender, nondistended, + BS MS: no deformity or atrophy Skin: warm and dry, no rash Neuro:  Strength and sensation are intact Psych: euthymic mood, full affect   EKG:  EKG is ordered today. The ekg reveals sinus rhythm with nonspecific T-wave flattening, first-degree AV block, and nonspecific ST abnormality. No acute changes noted.   Recent Labs: 10/05/2014: TSH 4.46 04/26/2015: ALT 17; BUN 14.6; Creatinine 1.3*; HGB 12.9; Platelets  175; Potassium 3.9; Sodium 138    Lipid Panel No results found for: CHOL, TRIG, HDL, CHOLHDL, VLDL, LDLCALC, LDLDIRECT    Wt Readings from Last 3 Encounters:  05/23/15 218 lb (98.884 kg)  04/26/15 218 lb 1.3 oz (98.92 kg)  03/30/15 220 lb 12.8 oz (100.154 kg)      Other studies Reviewed: Additional studies/ records that were reviewed today include: Review of prior echocardiogram from less than a year ago. The findings include results include:. Study Conclusions  - Left ventricle: LVEF is approximately 35% with hypokinesis of the inferior, inferoseptal, posterior and apical walls. Wall thickness was increased in a pattern of moderate LVH. - Mitral valve: MV prosthesis difficult to see well Peak and mean gradients through the valve are 22 and 13 mm Hg respectively. There was mild regurgitation. - Left atrium: The atrium was severely dilated.   ASSESSMENT AND PLAN:  1. Chronic systolic HF (heart failure) (HCC) No evidence of volume overload  2. VENTRICULAR TACHYCARDIA Recent device interrogation without evidence of device discharge  3. 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 History of amiodarone therapy Amiodarone is been discontinued  5. S/P MVR (mitral valve  replacement) There is mild gradient across the bioprosthetic mitral valve  6. Automatic implantable cardioverter-defibrillator in situ Recently evaluated and demonstrated no evidence of malfunction or discharge    Current medicines are reviewed at length with the patient today.  The patient has the following concerns regarding medicines: None.  The following changes/actions have been instituted:    Continue the current medical regimen  Call if palpitations, chest pain, or dyspnea  Labs/ tests ordered today include:  No orders of the defined types were placed in this encounter.     Disposition:   FU with HS in 1 year  Signed, Stacy Grooms, MD  05/23/2015 10:19 AM    [address] [place], [place],   [address] Phone: [phone]; Fax: [phone]

## 2015-05-23 NOTE — Patient Instructions (Signed)

## 2015-05-24 ENCOUNTER — Telehealth: Payer: Self-pay | Admitting: *Deleted

## 2015-05-24 NOTE — Telephone Encounter (Addendum)
Patient aware of results. Will begin taking 2000 units a day  ----- Message from Volanda Napoleon, MD sent at 05/24/2015  1:51 PM EST ----- Call - bone density shows some osteopenia.  Is she on vit D?   If not, she needs 2000 units q day.  pete

## 2015-05-27 ENCOUNTER — Other Ambulatory Visit: Payer: Self-pay | Admitting: Interventional Cardiology

## 2015-05-28 ENCOUNTER — Encounter (HOSPITAL_BASED_OUTPATIENT_CLINIC_OR_DEPARTMENT_OTHER): Payer: Self-pay | Admitting: *Deleted

## 2015-05-28 ENCOUNTER — Emergency Department (HOSPITAL_BASED_OUTPATIENT_CLINIC_OR_DEPARTMENT_OTHER)
Admission: EM | Admit: 2015-05-28 | Discharge: 2015-05-28 | Disposition: A | Discharge status: Home / Self Care | Payer: 59 | Source: Home / Self Care | Attending: Emergency Medicine | Admitting: Emergency Medicine

## 2015-05-28 ENCOUNTER — Inpatient Hospital Stay (HOSPITAL_BASED_OUTPATIENT_CLINIC_OR_DEPARTMENT_OTHER)
Admission: EM | Admit: 2015-05-28 | Discharge: 2015-06-01 | DRG: 291 | Disposition: A | Discharge status: Home / Self Care | Payer: 59 | Attending: Internal Medicine | Admitting: Internal Medicine

## 2015-05-28 ENCOUNTER — Emergency Department (HOSPITAL_BASED_OUTPATIENT_CLINIC_OR_DEPARTMENT_OTHER): Payer: 59

## 2015-05-28 DIAGNOSIS — Z888 Allergy status to other drugs, medicaments and biological substances status: Secondary | ICD-10-CM

## 2015-05-28 DIAGNOSIS — Z8543 Personal history of malignant neoplasm of ovary: Secondary | ICD-10-CM

## 2015-05-28 DIAGNOSIS — J45909 Unspecified asthma, uncomplicated: Secondary | ICD-10-CM | POA: Diagnosis present

## 2015-05-28 DIAGNOSIS — Z95810 Presence of automatic (implantable) cardiac defibrillator: Secondary | ICD-10-CM | POA: Diagnosis not present

## 2015-05-28 DIAGNOSIS — J9601 Acute respiratory failure with hypoxia: Secondary | ICD-10-CM | POA: Diagnosis present

## 2015-05-28 DIAGNOSIS — J209 Acute bronchitis, unspecified: Secondary | ICD-10-CM | POA: Diagnosis not present

## 2015-05-28 DIAGNOSIS — R0600 Dyspnea, unspecified: Secondary | ICD-10-CM | POA: Diagnosis not present

## 2015-05-28 DIAGNOSIS — R0902 Hypoxemia: Secondary | ICD-10-CM | POA: Diagnosis present

## 2015-05-28 DIAGNOSIS — Z96641 Presence of right artificial hip joint: Secondary | ICD-10-CM | POA: Diagnosis present

## 2015-05-28 DIAGNOSIS — E039 Hypothyroidism, unspecified: Secondary | ICD-10-CM | POA: Diagnosis present

## 2015-05-28 DIAGNOSIS — D3910 Neoplasm of uncertain behavior of unspecified ovary: Secondary | ICD-10-CM | POA: Diagnosis present

## 2015-05-28 DIAGNOSIS — Z8249 Family history of ischemic heart disease and other diseases of the circulatory system: Secondary | ICD-10-CM | POA: Diagnosis not present

## 2015-05-28 DIAGNOSIS — N183 Chronic kidney disease, stage 3 unspecified: Secondary | ICD-10-CM | POA: Diagnosis present

## 2015-05-28 DIAGNOSIS — D638 Anemia in other chronic diseases classified elsewhere: Secondary | ICD-10-CM | POA: Diagnosis present

## 2015-05-28 DIAGNOSIS — Z91048 Other nonmedicinal substance allergy status: Secondary | ICD-10-CM | POA: Diagnosis not present

## 2015-05-28 DIAGNOSIS — I5023 Acute on chronic systolic (congestive) heart failure: Principal | ICD-10-CM | POA: Diagnosis present

## 2015-05-28 DIAGNOSIS — I255 Ischemic cardiomyopathy: Secondary | ICD-10-CM | POA: Diagnosis present

## 2015-05-28 DIAGNOSIS — C569 Malignant neoplasm of unspecified ovary: Secondary | ICD-10-CM | POA: Diagnosis present

## 2015-05-28 DIAGNOSIS — F17210 Nicotine dependence, cigarettes, uncomplicated: Secondary | ICD-10-CM | POA: Diagnosis present

## 2015-05-28 DIAGNOSIS — Z952 Presence of prosthetic heart valve: Secondary | ICD-10-CM | POA: Diagnosis not present

## 2015-05-28 DIAGNOSIS — I5022 Chronic systolic (congestive) heart failure: Secondary | ICD-10-CM | POA: Diagnosis not present

## 2015-05-28 DIAGNOSIS — J40 Bronchitis, not specified as acute or chronic: Secondary | ICD-10-CM

## 2015-05-28 DIAGNOSIS — Z8551 Personal history of malignant neoplasm of bladder: Secondary | ICD-10-CM | POA: Diagnosis not present

## 2015-05-28 DIAGNOSIS — I252 Old myocardial infarction: Secondary | ICD-10-CM

## 2015-05-28 DIAGNOSIS — Z954 Presence of other heart-valve replacement: Secondary | ICD-10-CM | POA: Diagnosis not present

## 2015-05-28 DIAGNOSIS — Z7982 Long term (current) use of aspirin: Secondary | ICD-10-CM

## 2015-05-28 DIAGNOSIS — R0602 Shortness of breath: Secondary | ICD-10-CM | POA: Diagnosis present

## 2015-05-28 DIAGNOSIS — D649 Anemia, unspecified: Secondary | ICD-10-CM | POA: Diagnosis present

## 2015-05-28 DIAGNOSIS — I2510 Atherosclerotic heart disease of native coronary artery without angina pectoris: Secondary | ICD-10-CM | POA: Diagnosis present

## 2015-05-28 DIAGNOSIS — J208 Acute bronchitis due to other specified organisms: Secondary | ICD-10-CM | POA: Diagnosis not present

## 2015-05-28 DIAGNOSIS — Z79899 Other long term (current) drug therapy: Secondary | ICD-10-CM | POA: Diagnosis not present

## 2015-05-28 DIAGNOSIS — Z953 Presence of xenogenic heart valve: Secondary | ICD-10-CM

## 2015-05-28 LAB — CBC WITH DIFFERENTIAL/PLATELET
BASOS ABS: 0 10*3/uL (ref 0.0–0.1)
Basophils Relative: 0 %
EOS PCT: 2 %
Eosinophils Absolute: 0.1 10*3/uL (ref 0.0–0.7)
HEMATOCRIT: 35.9 % — AB (ref 36.0–46.0)
HEMOGLOBIN: 11.8 g/dL — AB (ref 12.0–15.0)
LYMPHS ABS: 1.2 10*3/uL (ref 0.7–4.0)
LYMPHS PCT: 14 %
MCH: 31.2 pg (ref 26.0–34.0)
MCHC: 32.9 g/dL (ref 30.0–36.0)
MCV: 95 fL (ref 78.0–100.0)
Monocytes Absolute: 1.2 10*3/uL — ABNORMAL HIGH (ref 0.1–1.0)
Monocytes Relative: 15 %
NEUTROS ABS: 6 10*3/uL (ref 1.7–7.7)
NEUTROS PCT: 69 %
PLATELETS: 142 10*3/uL — AB (ref 150–400)
RBC: 3.78 MIL/uL — AB (ref 3.87–5.11)
RDW: 13.7 % (ref 11.5–15.5)
WBC: 8.5 10*3/uL (ref 4.0–10.5)

## 2015-05-28 LAB — COMPREHENSIVE METABOLIC PANEL
ALK PHOS: 73 U/L (ref 38–126)
ALT: 14 U/L (ref 14–54)
AST: 24 U/L (ref 15–41)
Albumin: 3.9 g/dL (ref 3.5–5.0)
Anion gap: 9 (ref 5–15)
BILIRUBIN TOTAL: 0.7 mg/dL (ref 0.3–1.2)
BUN: 19 mg/dL (ref 6–20)
CALCIUM: 9.1 mg/dL (ref 8.9–10.3)
CHLORIDE: 105 mmol/L (ref 101–111)
CO2: 26 mmol/L (ref 22–32)
CREATININE: 1.31 mg/dL — AB (ref 0.44–1.00)
GFR, EST AFRICAN AMERICAN: 52 mL/min — AB (ref >60)
GFR, EST NON AFRICAN AMERICAN: 45 mL/min — AB (ref >60)
Glucose, Bld: 102 mg/dL — ABNORMAL HIGH (ref 65–99)
Potassium: 3.9 mmol/L (ref 3.5–5.1)
Sodium: 140 mmol/L (ref 135–145)
Total Protein: 6.9 g/dL (ref 6.5–8.1)

## 2015-05-28 LAB — TROPONIN I: Troponin I: <0.03 ng/mL (ref <0.031)

## 2015-05-28 LAB — BRAIN NATRIURETIC PEPTIDE: B NATRIURETIC PEPTIDE 5: 261.6 pg/mL — AB (ref 0.0–100.0)

## 2015-05-28 MED ORDER — ATORVASTATIN CALCIUM 40 MG PO TABS
40.0000 mg | ORAL_TABLET | Freq: Every evening | ORAL | Status: DC
  Administered 2015-05-29 – 2015-05-31 (×3): 40 mg via ORAL
  Filled 2015-05-28 (×3): qty 1

## 2015-05-28 MED ORDER — AMOXICILLIN 500 MG PO CAPS: 1000.0000 mg | ORAL_CAPSULE | Freq: Two times a day (BID) | ORAL | Status: DC

## 2015-05-28 MED ORDER — METHYLPREDNISOLONE SODIUM SUCC 125 MG IJ SOLR
125.0000 mg | Freq: Once | INTRAMUSCULAR | Status: AC
  Administered 2015-05-28: 125 mg via INTRAVENOUS
  Filled 2015-05-28: qty 2

## 2015-05-28 MED ORDER — LEVOTHYROXINE SODIUM 75 MCG PO TABS
75.0000 ug | ORAL_TABLET | Freq: Every day | ORAL | Status: DC
  Administered 2015-05-29 – 2015-06-01 (×4): 75 ug via ORAL
  Filled 2015-05-28 (×4): qty 1

## 2015-05-28 MED ORDER — METHYLPREDNISOLONE SODIUM SUCC 40 MG IJ SOLR: 40.0000 mg | Freq: Every day | INTRAMUSCULAR | Status: DC

## 2015-05-28 MED ORDER — IPRATROPIUM-ALBUTEROL 0.5-2.5 (3) MG/3ML IN SOLN
3.0000 mL | Freq: Once | RESPIRATORY_TRACT | Status: DC
  Filled 2015-05-28: qty 3

## 2015-05-28 MED ORDER — ACETAMINOPHEN 325 MG PO TABS: 650.0000 mg | ORAL_TABLET | Freq: Four times a day (QID) | ORAL | Status: DC | PRN

## 2015-05-28 MED ORDER — DOXYCYCLINE HYCLATE 100 MG IV SOLR
100.0000 mg | Freq: Two times a day (BID) | INTRAVENOUS | Status: DC
  Administered 2015-05-29 – 2015-05-30 (×4): 100 mg via INTRAVENOUS
  Filled 2015-05-28 (×6): qty 100

## 2015-05-28 MED ORDER — ASPIRIN EC 325 MG PO TBEC
325.0000 mg | DELAYED_RELEASE_TABLET | Freq: Every morning | ORAL | Status: DC
  Administered 2015-05-29 – 2015-06-01 (×4): 325 mg via ORAL
  Filled 2015-05-28 (×4): qty 1

## 2015-05-28 MED ORDER — LORAZEPAM 0.5 MG PO TABS: 0.5000 mg | ORAL_TABLET | Freq: Four times a day (QID) | ORAL | Status: DC | PRN

## 2015-05-28 MED ORDER — ZOLPIDEM TARTRATE 5 MG PO TABS
5.0000 mg | ORAL_TABLET | Freq: Every day | ORAL | Status: DC
  Administered 2015-05-29 – 2015-05-31 (×4): 5 mg via ORAL
  Filled 2015-05-28 (×4): qty 1

## 2015-05-28 MED ORDER — PREDNISONE 20 MG PO TABS: ORAL_TABLET | ORAL | Status: DC

## 2015-05-28 MED ORDER — IPRATROPIUM-ALBUTEROL 0.5-2.5 (3) MG/3ML IN SOLN
RESPIRATORY_TRACT | Status: AC
  Administered 2015-05-28: 3 mL
  Filled 2015-05-28: qty 3

## 2015-05-28 MED ORDER — ONDANSETRON HCL 4 MG/2ML IJ SOLN
4.0000 mg | Freq: Four times a day (QID) | INTRAMUSCULAR | Status: DC | PRN
Start: 2015-05-28 — End: 2015-06-01

## 2015-05-28 MED ORDER — OMEGA-3-ACID ETHYL ESTERS 1 G PO CAPS
1.0000 g | ORAL_CAPSULE | Freq: Every day | ORAL | Status: DC
  Administered 2015-05-29 – 2015-06-01 (×4): 1 g via ORAL
  Filled 2015-05-28 (×4): qty 1

## 2015-05-28 MED ORDER — SODIUM CHLORIDE 0.9 % IJ SOLN
3.0000 mL | Freq: Two times a day (BID) | INTRAMUSCULAR | Status: DC
  Administered 2015-05-29 – 2015-06-01 (×7): 3 mL via INTRAVENOUS

## 2015-05-28 MED ORDER — SPIRONOLACTONE 25 MG PO TABS
25.0000 mg | ORAL_TABLET | Freq: Two times a day (BID) | ORAL | Status: DC
  Administered 2015-05-29 – 2015-06-01 (×7): 25 mg via ORAL
  Filled 2015-05-28 (×7): qty 1

## 2015-05-28 MED ORDER — BUDESONIDE 0.25 MG/2ML IN SUSP: 0.2500 mg | Freq: Two times a day (BID) | RESPIRATORY_TRACT | Status: DC

## 2015-05-28 MED ORDER — IPRATROPIUM-ALBUTEROL 0.5-2.5 (3) MG/3ML IN SOLN
3.0000 mL | Freq: Once | RESPIRATORY_TRACT | Status: AC
  Administered 2015-05-28: 3 mL via RESPIRATORY_TRACT

## 2015-05-28 MED ORDER — IPRATROPIUM-ALBUTEROL 0.5-2.5 (3) MG/3ML IN SOLN
3.0000 mL | Freq: Once | RESPIRATORY_TRACT | Status: AC
  Administered 2015-05-28: 3 mL via RESPIRATORY_TRACT
  Filled 2015-05-28: qty 3

## 2015-05-28 MED ORDER — ALBUTEROL SULFATE (2.5 MG/3ML) 0.083% IN NEBU
2.5000 mg | INHALATION_SOLUTION | RESPIRATORY_TRACT | Status: DC
  Administered 2015-05-29: 2.5 mg via RESPIRATORY_TRACT
  Filled 2015-05-28: qty 3

## 2015-05-28 MED ORDER — ALBUTEROL SULFATE HFA 108 (90 BASE) MCG/ACT IN AERS: 2.0000 | INHALATION_SPRAY | RESPIRATORY_TRACT | Status: DC | PRN

## 2015-05-28 MED ORDER — ONDANSETRON HCL 4 MG PO TABS: 4.0000 mg | ORAL_TABLET | Freq: Four times a day (QID) | ORAL | Status: DC | PRN

## 2015-05-28 MED ORDER — ALBUTEROL SULFATE (2.5 MG/3ML) 0.083% IN NEBU: 2.5000 mg | INHALATION_SOLUTION | RESPIRATORY_TRACT | Status: DC | PRN

## 2015-05-28 MED ORDER — ALBUTEROL SULFATE (2.5 MG/3ML) 0.083% IN NEBU
2.5000 mg | INHALATION_SOLUTION | Freq: Once | RESPIRATORY_TRACT | Status: AC
  Administered 2015-05-28: 2.5 mg via RESPIRATORY_TRACT
  Filled 2015-05-28: qty 3

## 2015-05-28 MED ORDER — CHOLECALCIFEROL 10 MCG (400 UNIT) PO TABS
400.0000 [IU] | ORAL_TABLET | Freq: Two times a day (BID) | ORAL | Status: DC
  Administered 2015-05-29 – 2015-06-01 (×8): 400 [IU] via ORAL
  Filled 2015-05-28 (×10): qty 1

## 2015-05-28 MED ORDER — LETROZOLE 2.5 MG PO TABS
2.5000 mg | ORAL_TABLET | Freq: Every day | ORAL | Status: DC
  Filled 2015-05-28: qty 1

## 2015-05-28 MED ORDER — ACETAMINOPHEN 650 MG RE SUPP: 650.0000 mg | Freq: Four times a day (QID) | RECTAL | Status: DC | PRN

## 2015-05-28 MED ORDER — IPRATROPIUM BROMIDE 0.02 % IN SOLN
0.5000 mg | RESPIRATORY_TRACT | Status: DC
  Administered 2015-05-29: 0.5 mg via RESPIRATORY_TRACT
  Filled 2015-05-28: qty 2.5

## 2015-05-28 MED ORDER — AEROCHAMBER PLUS W/MASK MISC: Status: DC

## 2015-05-28 MED ORDER — AZITHROMYCIN 250 MG PO TABS: ORAL_TABLET | ORAL | Status: DC

## 2015-05-28 MED ORDER — ENOXAPARIN SODIUM 40 MG/0.4ML ~~LOC~~ SOLN
40.0000 mg | SUBCUTANEOUS | Status: DC
  Administered 2015-06-01: 40 mg via SUBCUTANEOUS
  Filled 2015-05-28 (×2): qty 0.4

## 2015-05-28 MED ORDER — ALBUTEROL (5 MG/ML) CONTINUOUS INHALATION SOLN
10.0000 mg/h | INHALATION_SOLUTION | RESPIRATORY_TRACT | Status: AC
  Administered 2015-05-28: 10 mg/h via RESPIRATORY_TRACT
  Filled 2015-05-28: qty 20

## 2015-05-28 MED ORDER — FUROSEMIDE 20 MG PO TABS
20.0000 mg | ORAL_TABLET | Freq: Every day | ORAL | Status: DC
  Administered 2015-05-29: 20 mg via ORAL
  Filled 2015-05-28: qty 1

## 2015-05-28 MED ORDER — CARVEDILOL 12.5 MG PO TABS: 12.5000 mg | ORAL_TABLET | Freq: Two times a day (BID) | ORAL | Status: DC

## 2015-05-28 NOTE — ED Notes (Signed)
Pt states feels better after HHN tx by RT

## 2015-05-28 NOTE — H&P (Signed)
[facility] History and Physical  [name] [id number] DOB: 08-11-1960 DOA: 05/28/2015  Referring physician: Patient was transferred from [facility] [place]. PCP: No PCP Per Patient Dr. Charleston Poot. Specialists: Dr. Daneen Schick. Cardiologist.  Chief Complaint: Shortness of breath.  HPI: [name] is a 54 y.o. female with history of chronic systolic heart failure last year measured was 35%, AICD placement, tissue mitral valve replacement, chronic kidney disease presents to ER with complaints of shortness of breath. Patient has been experiencing shortness of breath over the last 2 days with productive cough. Patient come to the ER earlier and was discharged home on prednisone and antibiotics despite the patient's wheezing worsened and came to the ER. Chest x-ray shows bronchitis features and on exam patient has bilateral expiratory wheeze. Patient's shortness of breath increases on exertion. On exam patient does not have any signs of fluid overload. Denies any fever chills chest pain nausea vomiting or abdominal pain. Patient has been admitted for acute bronchitis.   Review of Systems: As presented in the history of presenting illness, rest negative.  Past Medical History  Diagnosis Date  . Arrhythmia     ventricular tachycardia  . Unspecified systolic heart failure   . Ventricular fibrillation ([place])   . ICD (implantable cardiac defibrillator) in place   . Pacemaker   . High cholesterol   . CHF (congestive heart failure) ([place])   . Myocardial infarction ([place]) 2011  . Hypothyroidism   . Arthritis 02/06/2012    "everywhere"  . Bladder cancer [facility]) 2009    "injected medicine to get rid of it"  . 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 Pneumonia     as child  . Neuropathy (HCC)     FEET AND HANDS - FROM CHEMO - BUT MUCH IMPROVED  . Port-a-cath in place     RIGHT UPPER CHEST  . Pain     JOINT PAINS AND MUSCLE ACHES ALL OVER.  Marland Kitchen  Automatic implantable cardioverter-defibrillator in situ     DR. Beckie Salts   . Coronary artery disease     DR. Linard Millers  . Asthma     as a child   Past Surgical History  Procedure Laterality Date  . Cardiac defibrillator placement  02/06/2012    "lead change"  . Insert / replace / remove pacemaker  10/2009    [place]  . Tonsillectomy and adenoidectomy  ~ 1967  . Appendectomy  1989  . Vaginal hysterectomy  2001  . Tubal ligation  1993  . Bilateral oophorectomy  2007  . Mitral valve replacement  2011    "pig valve"  . Cardiac valve replacement    . Transurethral resection of bladder  2009    "for bladder cancer"  . Thymus removed    . Total hip arthroplasty  05/06/2012    Procedure: TOTAL HIP ARTHROPLASTY;  Surgeon: Gearlean Alf, MD;  Location: [facility];  Service: Orthopedics;  Laterality: Right;  . Laparotomy N/A 01/19/2013    Procedure: TUMOR DEBULKING / BOWEL RESECTION/INSERTION RIGHT UTERERAL DOUBLE J STENT/OMENTECTOMY;  Surgeon: Alvino Chapel, MD;  Location: [facility];  Service: Gynecology;  Laterality: N/A;  . Cystoscopy Right 02/23/2013    Procedure: CYSTOSCOPY WITH STENT REMOVAL;  Surgeon: Alvino Chapel, MD;  Location: [facility];  Service: Gynecology;  Laterality: Right;  . Abdominal hysterectomy N/A 07/27/2013    Procedure: TUMOR EXCISION OF ABDOMINAL MASS;  Surgeon: Alvino Chapel, MD;  Location: [facility];  Service: Gynecology;  Laterality: N/A;  . Implantable cardioverter defibrillator revision N/A 02/06/2012    Procedure: IMPLANTABLE CARDIOVERTER DEFIBRILLATOR REVISION;  Surgeon: Evans Lance, MD;  [facility] LAB;  Service: Cardiovascular;  Laterality: N/A;   Social History:  reports that she has been smoking Cigarettes.  She started smoking about 41 years ago. She has a 15 pack-year smoking history. She has never used smokeless tobacco. She reports that she does not drink alcohol or use illicit drugs. Where does patient  live home. Can patient participate in ADLs? Yes.  Allergies  Allergen Reactions  . Prochlorperazine Edisylate Other (See Comments)    Nervous/ flutter/ shakes--compazine  . Adhesive [Tape] Other (See Comments)    Redness/hives from adhesive tape( tolerates latex gloves); tolerates paper tape    Family History:  Family History  Problem Relation Age of Onset  . Heart attack Maternal Grandfather   . Heart attack Paternal Grandfather   . Stroke Maternal Grandmother   . Heart attack Brother       Prior to Admission medications   Medication Sig Start Date End Date Taking? Authorizing Provider  albuterol (PROVENTIL HFA;VENTOLIN HFA) 108 (90 BASE) MCG/ACT inhaler Inhale 2 puffs into the lungs every 4 (four) hours as needed for wheezing or shortness of breath. 05/28/15   Charlesetta Shanks, MD  amoxicillin (AMOXIL) 500 MG capsule Take 2 capsules (1,000 mg total) by mouth 2 (two) times daily. 05/28/15   Charlesetta Shanks, MD  aspirin 325 MG EC tablet Take 325 mg by mouth every morning.     Historical Provider, MD  atorvastatin (LIPITOR) 40 MG tablet Take 1 tablet (40 mg total) by mouth every evening. 05/13/14   Evans Lance, MD  b complex vitamins tablet Take 1 tablet by mouth daily.    Historical Provider, MD  carvedilol (COREG) 12.5 MG tablet TAKE 1 TABLET (12.5 MG TOTAL) BY MOUTH 2 (TWO) TIMES DAILY WITH A MEAL. 05/23/15   Belva Crome, MD  cholecalciferol (VITAMIN D-400) 400 UNITS TABS tablet Take 400 Units by mouth 2 (two) times daily.    Historical Provider, MD  diphenhydrAMINE (BENADRYL) 25 MG tablet Take 25 mg by mouth as needed for itching or allergies. ALLERGIES    Historical Provider, MD  fish oil-omega-3 fatty acids 1000 MG capsule Take 1 g by mouth 2 (two) times daily.    Historical Provider, MD  furosemide (LASIX) 20 MG tablet Take 1 tablet (20 mg total) by mouth daily. 03/30/15   Evans Lance, MD  letrozole [facility]) 2.5 MG tablet Take 1 tablet (2.5 mg total) by mouth daily. 03/10/15    Melissa D Cross, NP  levothyroxine (SYNTHROID, LEVOTHROID) 75 MCG tablet TAKE 1 TABLET (75 MCG TOTAL) BY MOUTH DAILY BEFORE BREAKFAST. 01/09/15   Belva Crome, MD  LORazepam (ATIVAN) 0.5 MG tablet Take 1 tablet (0.5 mg total) by mouth every 6 (six) hours as needed (Nausea or vomiting). 02/03/15   Volanda Napoleon, MD  Multiple Vitamins-Minerals (MULTIVITAMIN PO) Take 1 tablet by mouth daily.     Historical Provider, MD  predniSONE (DELTASONE) 20 MG tablet 3 tabs po day one, then 2 po daily x 4 days 05/28/15   Charlesetta Shanks, MD  Spacer/Aero-Holding Chambers (AEROCHAMBER PLUS WITH MASK) inhaler Use as instructed 05/28/15   Charlesetta Shanks, MD  spironolactone (ALDACTONE) 25 MG tablet TAKE 1 TABLET (25 MG TOTAL) BY MOUTH 2 (TWO) TIMES DAILY. 05/23/15   Mallie Mussel  Nicholes Stairs, MD  zolpidem (AMBIEN) 5 MG tablet Take 1 tablet (5 mg total) by mouth at bedtime. 04/26/15   Volanda Napoleon, MD    Physical Exam: Filed Vitals:   05/28/15 1857 05/28/15 2030 05/28/15 2100 05/28/15 2239  BP:  111/64 100/68 117/70  Pulse: 110 100 101 104  Temp:    98.1 F (36.7 C)  TempSrc:    Oral  Resp: 20 23 12 15   Height:    5\' 6"  (1.676 m)  Weight:    97.796 kg (215 lb 9.6 oz)  SpO2: 96% 94% 95% 94%     General:  Moderately built and nourished.  Eyes: Anicteric. No pallor.  ENT: No discharge from the ears eyes nose or mouth.  Neck: No JVD felt. No mass felt.  Cardiovascular: S1 and S2 heard.  Respiratory: Bilateral expiratory wheeze and no crepitations.  Abdomen: Soft nontender bowel sounds present.  Skin: No rash.  Musculoskeletal: No edema.  Psychiatric: Appears normal.  Neurologic: Alert awake oriented to time place and person. Moves all extremities.  Labs on Admission:  Basic Metabolic Panel:  Recent Labs Lab 05/28/15 0725  NA 140  K 3.9  CL 105  CO2 26  GLUCOSE 102*  BUN 19  CREATININE 1.31*  CALCIUM 9.1   Liver Function Tests:  Recent Labs Lab 05/28/15 0725  AST 24  ALT 14  ALKPHOS 73   BILITOT 0.7  PROT 6.9  ALBUMIN 3.9   No results for input(s): LIPASE, AMYLASE in the last 168 hours. No results for input(s): AMMONIA in the last 168 hours. CBC:  Recent Labs Lab 05/28/15 0725  WBC 8.5  NEUTROABS 6.0  HGB 11.8*  HCT 35.9*  MCV 95.0  PLT 142*   Cardiac Enzymes:  Recent Labs Lab 05/28/15 0725 05/28/15 1730  TROPONINI <0.03 <0.03    BNP (last 3 results)  Recent Labs  05/28/15 0725  BNP 261.6*    ProBNP (last 3 results) No results for input(s): PROBNP in the last 8760 hours.  CBG: No results for input(s): GLUCAP in the last 168 hours.  Radiological Exams on Admission: Dg Chest 2 View  05/28/2015  CLINICAL DATA:  Worsening chest pain and shortness of breath. EXAM: CHEST  2 VIEW COMPARISON:  Earlier today. FINDINGS: The heart remains normal in size with stable median sternotomy wires, prosthetic mitral valve and left subclavian pacer and AICD leads. Clear lungs with normal vascularity. Mild central peribronchial thickening. Mild thoracic spine degenerative changes. IMPRESSION: Mild bronchitic changes.  No acute abnormality. Electronically Signed   By: Claudie Revering M.D.   On: 05/28/2015 18:10   Dg Chest 2 View  05/28/2015  CLINICAL DATA:  Shortness of breath and productive cough for 3 days, congestion. History of CHF, asthma, mitral valve replacement, defibrillator. EXAM: CHEST  2 VIEW COMPARISON:  Chest x-ray dated 03/10/2013. FINDINGS: Heart size is upper normal, unchanged. Median sternotomy wires appear intact and stable in alignment. Mitral valve replacement hardware appears stable in position. Left chest wall pacemaker/AICD is stable in position. Lungs are clear. No evidence of CHF. No evidence of pneumonia. No pleural effusion seen. No pneumothorax seen. Again noted are degenerative changes of the left shoulder with associated calcified loose bodies and/or sequela of chronic calcific tendinopathy. No acute- appearing osseous abnormality. IMPRESSION:  Stable chest x-ray. No evidence of acute cardiopulmonary abnormality. Electronically Signed   By: Franki Cabot M.D.   On: 05/28/2015 08:02    EKG: Independently reviewed. Sinus tachycardia.  Assessment/Plan Principal  Problem:   Acute bronchitis Active Problems:   Chronic systolic HF (heart failure) (HCC)   S/P MVR (mitral valve replacement)   Automatic implantable cardioverter-defibrillator in situ   Granulosa cell tumor of ovary   Hypoxia   CKD (chronic kidney disease) stage 3, GFR 30-59 ml/min   Chronic anemia   1. Acute bronchitis - patient on exam has bilateral expiratory wheeze and also has history of productive cough. Patient has been placed on doxycycline Pulmicort nebulizer and IV steroid. Check influenza PCR. 2. Chronic systolic heart failure last EF measured in July 2016 was 35% - appears compensated continue Lasix and spironolactone. 3. Chronic kidney disease stage III - creatinine appears to be at baseline. 4. Chronic anemia - follow CBC. 5. Status post tissue mitral valve replacement. 6. History of recurrence of ovarian cancer being followed by Dr. Marin Olp.  I have reviewed patient's old charts and labs. Personally reviewed the patient's EKG and chest x-ray.   DVT Prophylaxis Lovenox.  Code Status: Full code.  Family Communication: Discussed with patient.  Disposition Plan: Admit to inpatient.    Hosanna Betley N. [facility] Pager [phone].  If 7PM-7AM, please contact night-coverage [email or web address] Password [email or web address] 05/28/2015, 11:25 PM

## 2015-05-28 NOTE — ED Notes (Signed)
Having prod coughs also, thick yellow sec

## 2015-05-28 NOTE — ED Notes (Signed)
EDP notified of pts arrival

## 2015-05-28 NOTE — ED Notes (Signed)
DC instructions reviewed with pt, discussed each prescription as written by EDP, pt teaching done re: when to call cardiologist or to return to ED, also provided information on finding a primary MD. Opportunity for questions provided

## 2015-05-28 NOTE — ED Notes (Signed)
Pt states after going home from being DC today from this ED, utilized inhaler twice after arriving home, returns for further c/o Shortness of Breath, denies any chest pain

## 2015-05-28 NOTE — ED Provider Notes (Signed)
CSN: [id number]     Arrival date & time 05/28/15  [id number] History   First MD Initiated Contact with Patient 05/28/15 0715     Chief Complaint  Patient presents with  . Cough     (Consider location/radiation/quality/duration/timing/severity/associated sxs/prior Treatment) HPI 2 days ago developed cough and chest congestion. Cough is productive of a thick yellow mucus. Patient reports temperature elevated for her. She reports normal temperature is around 97 and temperature measured at home has been close to 100. No chest pain. Shortness of breath lying flat. Patient denies congestive heart failure since her mitral valve rupture. She does however report that she was having exertional dyspnea and 3 months ago her cardiologist started her on Lasix which resolved that symptom completely. He denies any lower extremity swelling. No calf pain. No history of PE or DVT. No abdominal pain, nausea or vomiting. Denies history of COPD or home inhaler\nebulizer use. Past Medical History  Diagnosis Date  . Arrhythmia     ventricular tachycardia  . Unspecified systolic heart failure   . Ventricular fibrillation ([place])   . ICD (implantable cardiac defibrillator) in place   . Pacemaker   . High cholesterol   . CHF (congestive heart failure) ([place])   . Myocardial infarction ([place]) 2011  . Hypothyroidism   . Arthritis 02/06/2012    "everywhere"  . Bladder cancer [facility]) 2009    "injected medicine to get rid of it"  . 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 Pneumonia     as child  . Neuropathy (HCC)     FEET AND HANDS - FROM CHEMO - BUT MUCH IMPROVED  . Port-a-cath in place     RIGHT UPPER CHEST  . Pain     JOINT PAINS AND MUSCLE ACHES ALL OVER.  Marland Kitchen Automatic implantable cardioverter-defibrillator in situ     DR. Beckie Salts   . Coronary artery disease     DR. Linard Millers  . Asthma     as a child   Past Surgical History  Procedure Laterality Date  . Cardiac defibrillator  placement  02/06/2012    "lead change"  . Insert / replace / remove pacemaker  10/2009    [place]  . Tonsillectomy and adenoidectomy  ~ 1967  . Appendectomy  1989  . Vaginal hysterectomy  2001  . Tubal ligation  1993  . Bilateral oophorectomy  2007  . Mitral valve replacement  2011    "pig valve"  . Cardiac valve replacement    . Transurethral resection of bladder  2009    "for bladder cancer"  . Thymus removed    . Total hip arthroplasty  05/06/2012    Procedure: TOTAL HIP ARTHROPLASTY;  Surgeon: Gearlean Alf, MD;  [facility];  Service: Orthopedics;  Laterality: Right;  . Laparotomy N/A 01/19/2013    Procedure: TUMOR DEBULKING / BOWEL RESECTION/INSERTION RIGHT UTERERAL DOUBLE J STENT/OMENTECTOMY;  Surgeon: Alvino Chapel, MD;  [facility]: [facility];  Service: Gynecology;  Laterality: N/A;  . Cystoscopy Right 02/23/2013    Procedure: CYSTOSCOPY WITH STENT REMOVAL;  Surgeon: Alvino Chapel, MD;  [facility]: [facility];  Service: Gynecology;  Laterality: Right;  . Abdominal hysterectomy N/A 07/27/2013    Procedure: TUMOR EXCISION OF ABDOMINAL MASS;  Surgeon: Alvino Chapel, MD;  [facility]: [facility];  Service: Gynecology;  Laterality: N/A;  . Implantable cardioverter defibrillator revision N/A 02/06/2012    Procedure: IMPLANTABLE CARDIOVERTER DEFIBRILLATOR REVISION;  Surgeon: Evans Lance, MD;  Location: [facility] CATH LAB;  Service: Cardiovascular;  Laterality: N/A;   Family History  Problem Relation Age of Onset  . Heart attack Maternal Grandfather   . Heart attack Paternal Grandfather   . Stroke Maternal Grandmother   . Heart attack Brother    Social History  Substance Use Topics  . Smoking status: Current Some Day Smoker -- 0.50 packs/day for 30 years    Types: Cigarettes    Start date: 03/28/1974  . Smokeless tobacco: Never Used     Comment:  10-27-13  still smoking daily  . Alcohol Use: No   OB History    No data available     Review  of Systems  10 Systems reviewed and are negative for acute change except as noted in the HPI.   Allergies  Prochlorperazine edisylate and Adhesive  Home Medications   Prior to Admission medications   Medication Sig Start Date End Date Taking? Authorizing Provider  albuterol (PROVENTIL HFA;VENTOLIN HFA) 108 (90 BASE) MCG/ACT inhaler Inhale 2 puffs into the lungs every 4 (four) hours as needed for wheezing or shortness of breath. 05/28/15   Charlesetta Shanks, MD  aspirin 325 MG EC tablet Take 325 mg by mouth every morning.     Historical Provider, MD  atorvastatin (LIPITOR) 40 MG tablet Take 1 tablet (40 mg total) by mouth every evening. 05/13/14   Evans Lance, MD  azithromycin (ZITHROMAX Z-PAK) 250 MG tablet 2 po day one, then 1 daily x 4 days 05/28/15   Charlesetta Shanks, MD  b complex vitamins tablet Take 1 tablet by mouth daily.    Historical Provider, MD  carvedilol (COREG) 12.5 MG tablet TAKE 1 TABLET (12.5 MG TOTAL) BY MOUTH 2 (TWO) TIMES DAILY WITH A MEAL. 05/23/15   Belva Crome, MD  cholecalciferol (VITAMIN D-400) 400 UNITS TABS tablet Take 400 Units by mouth 2 (two) times daily.    Historical Provider, MD  diphenhydrAMINE (BENADRYL) 25 MG tablet Take 25 mg by mouth as needed for itching or allergies. ALLERGIES    Historical Provider, MD  fish oil-omega-3 fatty acids 1000 MG capsule Take 1 g by mouth 2 (two) times daily.    Historical Provider, MD  furosemide (LASIX) 20 MG tablet Take 1 tablet (20 mg total) by mouth daily. 03/30/15   Evans Lance, MD  letrozole [facility]) 2.5 MG tablet Take 1 tablet (2.5 mg total) by mouth daily. 03/10/15   Melissa D Cross, NP  levothyroxine (SYNTHROID, LEVOTHROID) 75 MCG tablet TAKE 1 TABLET (75 MCG TOTAL) BY MOUTH DAILY BEFORE BREAKFAST. 01/09/15   Belva Crome, MD  LORazepam (ATIVAN) 0.5 MG tablet Take 1 tablet (0.5 mg total) by mouth every 6 (six) hours as needed (Nausea or vomiting). 02/03/15   Volanda Napoleon, MD  Multiple Vitamins-Minerals  (MULTIVITAMIN PO) Take 1 tablet by mouth daily.     Historical Provider, MD  predniSONE (DELTASONE) 20 MG tablet 3 tabs po day one, then 2 po daily x 4 days 05/28/15   Charlesetta Shanks, MD  Spacer/Aero-Holding Chambers (AEROCHAMBER PLUS WITH MASK) inhaler Use as instructed 05/28/15   Charlesetta Shanks, MD  spironolactone (ALDACTONE) 25 MG tablet TAKE 1 TABLET (25 MG TOTAL) BY MOUTH 2 (TWO) TIMES DAILY. 05/23/15   Belva Crome, MD  zolpidem (AMBIEN) 5 MG tablet Take 1 tablet (5 mg total) by mouth at bedtime. 04/26/15   Volanda Napoleon, MD   BP 106/76 mmHg  Pulse 86  Temp(Src) 97.7 F (36.5 C) (Oral)  Resp 12  SpO2 97% Physical Exam  Constitutional: She is oriented to person, place, and time. She appears well-developed and well-nourished. No distress.  HENT:  Head: Normocephalic and atraumatic.  Nose: Nose normal.  Mouth/Throat: Oropharynx is clear and moist.  Eyes: EOM are normal. Pupils are equal, round, and reactive to light.  Neck: Neck supple.  Cardiovascular: Normal rate, regular rhythm, normal heart sounds and intact distal pulses.   Pulmonary/Chest:  Mild increased work of breathing. Wheeze diffusely throughout lung fields. Adequate air flow to bases. No gross rails.  Abdominal: Soft. Bowel sounds are normal. She exhibits no distension. There is no tenderness.  Musculoskeletal: Normal range of motion. She exhibits no edema or tenderness.  Neurological: She is alert and oriented to person, place, and time. She has normal strength. She exhibits normal muscle tone. Coordination normal. GCS eye subscore is 4. GCS verbal subscore is 5. GCS motor subscore is 6.  Skin: Skin is warm, dry and intact.  Psychiatric: She has a normal mood and affect.    ED Course  Procedures (including critical care time) Labs Review Labs Reviewed  CBC WITH DIFFERENTIAL/PLATELET - Abnormal; Notable for the following:    RBC 3.78 (*)    Hemoglobin 11.8 (*)    HCT 35.9 (*)    Platelets 142 (*)    Monocytes  Absolute 1.2 (*)    All other components within normal limits  COMPREHENSIVE METABOLIC PANEL - Abnormal; Notable for the following:    Glucose, Bld 102 (*)    Creatinine, Ser 1.31 (*)    GFR calc non Af Amer 45 (*)    GFR calc Af Amer 52 (*)    All other components within normal limits  BRAIN NATRIURETIC PEPTIDE - Abnormal; Notable for the following:    B Natriuretic Peptide 261.6 (*)    All other components within normal limits  CULTURE, BLOOD (ROUTINE X 2)  CULTURE, BLOOD (ROUTINE X 2)  TROPONIN I    Imaging Review Dg Chest 2 View  05/28/2015  CLINICAL DATA:  Shortness of breath and productive cough for 3 days, congestion. History of CHF, asthma, mitral valve replacement, defibrillator. EXAM: CHEST  2 VIEW COMPARISON:  Chest x-ray dated 03/10/2013. FINDINGS: Heart size is upper normal, unchanged. Median sternotomy wires appear intact and stable in alignment. Mitral valve replacement hardware appears stable in position. Left chest wall pacemaker/AICD is stable in position. Lungs are clear. No evidence of CHF. No evidence of pneumonia. No pleural effusion seen. No pneumothorax seen. Again noted are degenerative changes of the left shoulder with associated calcified loose bodies and/or sequela of chronic calcific tendinopathy. No acute- appearing osseous abnormality. IMPRESSION: Stable chest x-ray. No evidence of acute cardiopulmonary abnormality. Electronically Signed   By: Franki Cabot M.D.   On: 05/28/2015 08:02   I have personally reviewed and evaluated these images and lab results as part of my medical decision-making.   EKG Interpretation   Date/Time:  Sunday May 28 2015 07:28:12 EST Ventricular Rate:  83 PR Interval:  180 QRS Duration: 114 QT Interval:  392 QTC Calculation: 460 R Axis:   78 Text Interpretation:  Normal sinus rhythm Cannot rule out Inferior infarct  , age undetermined Abnormal ECG IVCD. inferior Q waves. No acute STEMI.  Nonspecific T-wave abnormalities.  No recent old EKG for comparison.Some  change  compared to 2013. Confirmed by Johnney Killian, MD, Jeannie Done [phone]) on  05/28/2015 7:34:57 AM     Patient recheck  11:20 patient reports she feels much better at this time. She reports now she feels that she can take a deep breath and is not feeling constricted the chest. Repeat auscultation continues to have audible expiratory wheeze but good air flow to the bases. MDM   Final diagnoses:  Acute bronchitis, unspecified organism   The patient has cough and wheeze consistent with bronchitis. She reports fever based on her baseline temperature of being 97 and temperature is monitored at home up to 99. She does report chills with this. No focal pneumonia is present on chest x-ray. Patient has significant cardiac history but has a stable with recent cardiology follow-up and no acute changes or deterioration. Patient denies history of recurrent bronchitis or wheeze. Today she reports yellow sputum and chills. Chest x-ray does not show focal infiltrate however the patient will be started on amoxicillin (avoiding Zithromax due to patient's cardiac history). Patient is placed on prednisone and every 4 hours albuterol. She is counseled on no secondhand smoke exposure. She lives with her ex-husband however she reports to live on opposite sides of the house and she will not have secondhand smoke exposure. Patient is counseled on the necessity for immediate return to the hospital should she develop worsening shortness of breath, chest pain or other concerning symptoms.    Charlesetta Shanks, MD 05/28/15 [phone]

## 2015-05-28 NOTE — ED Notes (Signed)
RT at bedside.

## 2015-05-28 NOTE — ED Notes (Signed)
Cough, congestion, SOB, since Friday, having prod cough, thick yellow, last night, difficulty lying down

## 2015-05-28 NOTE — ED Notes (Signed)
Patient ambulated, R/A SpO2 89% at lowest HR 110, RR 18 pursed lip, +DOE.

## 2015-05-28 NOTE — ED Notes (Signed)
Pt returns from radiology, placed back on cont cardiac monitoring with cont POX

## 2015-05-28 NOTE — Progress Notes (Signed)
54 yo F with h/o CHF due to ischemic cardiomyopathy, MVR.  Patient has hypoxia, despite very significant cardiac history, this looks more like bronchitis.  Also has ovarian cancer too.  Going to tele for steroids, breathing treatments.

## 2015-05-28 NOTE — ED Provider Notes (Signed)
CSN: [id number]     Arrival date & time 05/28/15  1709 History   First MD Initiated Contact with Patient 05/28/15 1714     Chief Complaint  Patient presents with  . Shortness of Breath     (Consider location/radiation/quality/duration/timing/severity/associated sxs/prior Treatment) HPI   Patient has a significant PMH of ICD/pacemaker, CHF, MI in 2011, hypothyroidism, bladder cancer- currently undergoing treatment for reoccurrence, CAD without a history of asthma or COPD came to the ER earlier today with complaints of cough, congestion, SOB, productive cough since Friday with exertional dyspnea and orthopnea.  She had chest xray and blood work done in the ED which  Shows no acute cardiopulmonary abnormality. She had a BNP of 260. She was given 3 nebulizer treatments (per chart review) and Prednisone, she was doing significantly better and was discharged with rx for abx, albuterol and prednisone. Per patient she went home and rest for a few hours, when she got up she had dyspnea with exertion and was unable to go more than 10 steps without stopping. She then attempted to take a shower, after the shower she was so short of breath and fatigued that she was unable to recover therefore she returns to the ED. She is speaking in full sentences, has audible wheezing and increased effort of breathing.    Past Medical History  Diagnosis Date  . Arrhythmia     ventricular tachycardia  . Unspecified systolic heart failure   . Ventricular fibrillation ([place])   . ICD (implantable cardiac defibrillator) in place   . Pacemaker   . High cholesterol   . CHF (congestive heart failure) ([place])   . Myocardial infarction ([place]) 2011  . Hypothyroidism   . Arthritis 02/06/2012    "everywhere"  . Bladder cancer [facility]) 2009    "injected medicine to get rid of it"  . 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 Pneumonia     as child  . Neuropathy ([facility])     FEET AND HANDS - FROM CHEMO -  BUT MUCH IMPROVED  . Port-a-cath in place     RIGHT UPPER CHEST  . Pain     JOINT PAINS AND MUSCLE ACHES ALL OVER.  Marland Kitchen Automatic implantable cardioverter-defibrillator in situ     DR. Beckie Salts   . Coronary artery disease     DR. Linard Millers  . Asthma     as a child   Past Surgical History  Procedure Laterality Date  . Cardiac defibrillator placement  02/06/2012    "lead change"  . Insert / replace / remove pacemaker  10/2009    [place]  . Tonsillectomy and adenoidectomy  ~ 1967  . Appendectomy  1989  . Vaginal hysterectomy  2001  . Tubal ligation  1993  . Bilateral oophorectomy  2007  . Mitral valve replacement  2011    "pig valve"  . Cardiac valve replacement    . Transurethral resection of bladder  2009    "for bladder cancer"  . Thymus removed    . Total hip arthroplasty  05/06/2012    Procedure: TOTAL HIP ARTHROPLASTY;  Surgeon: Gearlean Alf, MD;  Location: [facility];  Service: Orthopedics;  Laterality: Right;  . Laparotomy N/A 01/19/2013    Procedure: TUMOR DEBULKING / BOWEL RESECTION/INSERTION RIGHT UTERERAL DOUBLE J STENT/OMENTECTOMY;  Surgeon: Alvino Chapel, MD;  Location: [facility];  Service: Gynecology;  Laterality: N/A;  . Cystoscopy Right 02/23/2013  Procedure: CYSTOSCOPY WITH STENT REMOVAL;  Surgeon: Alvino Chapel, MD;  Location: [facility];  Service: Gynecology;  Laterality: Right;  . Abdominal hysterectomy N/A 07/27/2013    Procedure: TUMOR EXCISION OF ABDOMINAL MASS;  Surgeon: Alvino Chapel, MD;  Location: [facility];  Service: Gynecology;  Laterality: N/A;  . Implantable cardioverter defibrillator revision N/A 02/06/2012    Procedure: IMPLANTABLE CARDIOVERTER DEFIBRILLATOR REVISION;  Surgeon: Evans Lance, MD;  Location: [facility] C[facility] LAB;  Service: Cardiovascular;  Laterality: N/A;   Family History  Problem Relation Age of Onset  . Heart attack Maternal Grandfather   . Heart attack Paternal Grandfather   . Stroke Maternal  Grandmother   . Heart attack Brother    Social History  Substance Use Topics  . Smoking status: Current Some Day Smoker -- 0.50 packs/day for 30 years    Types: Cigarettes    Start date: 03/28/1974  . Smokeless tobacco: Never Used     Comment:  10-27-13  still smoking daily  . Alcohol Use: No   OB History    No data available     Review of Systems  All other systems reviewed and are negative.     Allergies  Prochlorperazine edisylate and Adhesive  Home Medications   Prior to Admission medications   Medication Sig Start Date End Date Taking? Authorizing Provider  albuterol (PROVENTIL HFA;VENTOLIN HFA) 108 (90 BASE) MCG/ACT inhaler Inhale 2 puffs into the lungs every 4 (four) hours as needed for wheezing or shortness of breath. 05/28/15   Charlesetta Shanks, MD  amoxicillin (AMOXIL) 500 MG capsule Take 2 capsules (1,000 mg total) by mouth 2 (two) times daily. 05/28/15   Charlesetta Shanks, MD  aspirin 325 MG EC tablet Take 325 mg by mouth every morning.     Historical Provider, MD  atorvastatin (LIPITOR) 40 MG tablet Take 1 tablet (40 mg total) by mouth every evening. 05/13/14   Evans Lance, MD  b complex vitamins tablet Take 1 tablet by mouth daily.    Historical Provider, MD  carvedilol (COREG) 12.5 MG tablet TAKE 1 TABLET (12.5 MG TOTAL) BY MOUTH 2 (TWO) TIMES DAILY WITH A MEAL. 05/23/15   Belva Crome, MD  cholecalciferol (VITAMIN D-400) 400 UNITS TABS tablet Take 400 Units by mouth 2 (two) times daily.    Historical Provider, MD  diphenhydrAMINE (BENADRYL) 25 MG tablet Take 25 mg by mouth as needed for itching or allergies. ALLERGIES    Historical Provider, MD  fish oil-omega-3 fatty acids 1000 MG capsule Take 1 g by mouth 2 (two) times daily.    Historical Provider, MD  furosemide (LASIX) 20 MG tablet Take 1 tablet (20 mg total) by mouth daily. 03/30/15   Evans Lance, MD  letrozole [facility]) 2.5 MG tablet Take 1 tablet (2.5 mg total) by mouth daily. 03/10/15   Melissa D Cross, NP   levothyroxine (SYNTHROID, LEVOTHROID) 75 MCG tablet TAKE 1 TABLET (75 MCG TOTAL) BY MOUTH DAILY BEFORE BREAKFAST. 01/09/15   Belva Crome, MD  LORazepam (ATIVAN) 0.5 MG tablet Take 1 tablet (0.5 mg total) by mouth every 6 (six) hours as needed (Nausea or vomiting). 02/03/15   Volanda Napoleon, MD  Multiple Vitamins-Minerals (MULTIVITAMIN PO) Take 1 tablet by mouth daily.     Historical Provider, MD  predniSONE (DELTASONE) 20 MG tablet 3 tabs po day one, then 2 po daily x 4 days 05/28/15   Charlesetta Shanks, MD  Spacer/Aero-Holding Chambers (AEROCHAMBER PLUS WITH MASK) inhaler Use as  instructed 05/28/15   Charlesetta Shanks, MD  spironolactone (ALDACTONE) 25 MG tablet TAKE 1 TABLET (25 MG TOTAL) BY MOUTH 2 (TWO) TIMES DAILY. 05/23/15   Belva Crome, MD  zolpidem (AMBIEN) 5 MG tablet Take 1 tablet (5 mg total) by mouth at bedtime. 04/26/15   Volanda Napoleon, MD   BP 99/68 mmHg  Pulse 110  Temp(Src) 99.1 F (37.3 C) (Oral)  Resp 20  SpO2 96% Physical Exam  Constitutional: She appears well-developed and well-nourished. No distress.  HENT:  Head: Normocephalic and atraumatic.  Eyes: Pupils are equal, round, and reactive to light.  Neck: Normal range of motion. Neck supple.  Cardiovascular: Normal rate and regular rhythm.   Pulmonary/Chest: Effort normal.  Diffuse expiratory wheezing, increased effort of breathing. She is not in respiratory distress speaking in full sentences.   Abdominal: Soft.  Musculoskeletal:  No LE swelling  Neurological: She is alert.  Skin: Skin is warm and dry.  Nursing note and vitals reviewed.   ED Course  Procedures (including critical care time) Labs Review Labs Reviewed  TROPONIN I    Imaging Review Dg Chest 2 View  05/28/2015  CLINICAL DATA:  Worsening chest pain and shortness of breath. EXAM: CHEST  2 VIEW COMPARISON:  Earlier today. FINDINGS: The heart remains normal in size with stable median sternotomy wires, prosthetic mitral valve and left subclavian  pacer and AICD leads. Clear lungs with normal vascularity. Mild central peribronchial thickening. Mild thoracic spine degenerative changes. IMPRESSION: Mild bronchitic changes.  No acute abnormality. Electronically Signed   By: Claudie Revering M.D.   On: 05/28/2015 18:10   Dg Chest 2 View  05/28/2015  CLINICAL DATA:  Shortness of breath and productive cough for 3 days, congestion. History of CHF, asthma, mitral valve replacement, defibrillator. EXAM: CHEST  2 VIEW COMPARISON:  Chest x-ray dated 03/10/2013. FINDINGS: Heart size is upper normal, unchanged. Median sternotomy wires appear intact and stable in alignment. Mitral valve replacement hardware appears stable in position. Left chest wall pacemaker/AICD is stable in position. Lungs are clear. No evidence of CHF. No evidence of pneumonia. No pleural effusion seen. No pneumothorax seen. Again noted are degenerative changes of the left shoulder with associated calcified loose bodies and/or sequela of chronic calcific tendinopathy. No acute- appearing osseous abnormality. IMPRESSION: Stable chest x-ray. No evidence of acute cardiopulmonary abnormality. Electronically Signed   By: Franki Cabot M.D.   On: 05/28/2015 08:02   I have personally reviewed and evaluated these images and lab results as part of my medical decision-making.   EKG Interpretation None      MDM   Final diagnoses:  Shortness of breath  Hypoxia  Bronchitis    Patient has had 3 nebulizer treatments, prednisone as well as an hour-long neb. When she is ambulated she drops into the mid to upper 80s heart rate of 110. She's having dyspnea on exertion with pursed lips.   Her chest x-ray shows bronchitic changes and she has a negative troponin.  Dr. Lita Mains as inpatient as well and agrees with the plan is for admission. We'll consult: [facility] hospitalists for request.  Dr. Alcario Drought has agreed to accept admission. Inpatient, Tele, [facility] admits, [facility]  Filed Vitals:    05/28/15 1843 05/28/15 1857  BP: 99/68   Pulse: 97 110  Temp:    Resp: 14 [id number], PA-C 05/28/15 2000  Julianne Rice, MD 05/29/15 2240

## 2015-05-28 NOTE — ED Notes (Signed)
MD at bedside. 

## 2015-05-28 NOTE — ED Notes (Signed)
Pt to exam room 7 via wc. RT with pt also, immediately placed on cont POX with cont Cardiac Monitoring

## 2015-05-28 NOTE — Discharge Instructions (Signed)
Acute Bronchitis Bronchitis is inflammation of the airways that extend from the windpipe into the lungs (bronchi). The inflammation often causes mucus to develop. This leads to a cough, which is the most common symptom of bronchitis.  In acute bronchitis, the condition usually develops suddenly and goes away over time, usually in a couple weeks. Smoking, allergies, and asthma can make bronchitis worse. Repeated episodes of bronchitis may cause further lung problems.  CAUSES Acute bronchitis is most often caused by the same virus that causes a cold. The virus can spread from person to person (contagious) through coughing, sneezing, and touching contaminated objects. SIGNS AND SYMPTOMS   Cough.   Fever.   Coughing up mucus.   Body aches.   Chest congestion.   Chills.   Shortness of breath.   Sore throat.  DIAGNOSIS  Acute bronchitis is usually diagnosed through a physical exam. Your health care provider will also ask you questions about your medical history. Tests, such as chest X-rays, are sometimes done to rule out other conditions.  TREATMENT  Acute bronchitis usually goes away in a couple weeks. Oftentimes, no medical treatment is necessary. Medicines are sometimes given for relief of fever or cough. Antibiotic medicines are usually not needed but may be prescribed in certain situations. In some cases, an inhaler may be recommended to help reduce shortness of breath and control the cough. A cool mist vaporizer may also be used to help thin bronchial secretions and make it easier to clear the chest.  HOME CARE INSTRUCTIONS  Get plenty of rest.   Drink enough fluids to keep your urine clear or pale yellow (unless you have a medical condition that requires fluid restriction). Increasing fluids may help thin your respiratory secretions (sputum) and reduce chest congestion, and it will prevent dehydration.   Take medicines only as directed by your health care provider.  If  you were prescribed an antibiotic medicine, finish it all even if you start to feel better.  Avoid smoking and secondhand smoke. Exposure to cigarette smoke or irritating chemicals will make bronchitis worse. If you are a smoker, consider using nicotine gum or skin patches to help control withdrawal symptoms. Quitting smoking will help your lungs heal faster.   Reduce the chances of another bout of acute bronchitis by washing your hands frequently, avoiding people with cold symptoms, and trying not to touch your hands to your mouth, nose, or eyes.   Keep all follow-up visits as directed by your health care provider.  SEEK MEDICAL CARE IF: Your symptoms do not improve after 1 week of treatment.  SEEK IMMEDIATE MEDICAL CARE IF:  You develop an increased fever or chills.   You have chest pain.   You have severe shortness of breath.  You have bloody sputum.   You develop dehydration.  You faint or repeatedly feel like you are going to pass out.  You develop repeated vomiting.  You develop a severe headache. MAKE SURE YOU:   Understand these instructions.  Will watch your condition.  Will get help right away if you are not doing well or get worse.   This information is not intended to replace advice given to you by your health care provider. Make sure you discuss any questions you have with your health care provider.   Document Released: 07/18/2004 Document Revised: 07/01/2014 Document Reviewed: 12/01/2012 Elsevier Interactive Patient Education 2016 [employer].  Emergency Department Resource Guide 1) Find a Doctor and Pay Out of Pocket Although you won't have to  find out who is covered by your insurance plan, it is a good idea to ask around and get recommendations. You will then need to call the office and see if the doctor you have chosen will accept you as a new patient and what types of options they offer for patients who are self-pay. Some doctors offer discounts or  will set up payment plans for their patients who do not have insurance, but you will need to ask so you aren't surprised when you get to your appointment.  2) Contact Your Local Health Department Not all health departments have doctors that can see patients for sick visits, but many do, so it is worth a call to see if yours does. If you don't know where your local health department is, you can check in your phone book. The CDC also has a tool to help you locate your state's health department, and many state websites also have listings of all of their local health departments.  3) Find a [facility] If your illness is not likely to be very severe or complicated, you may want to try a walk in clinic. These are popping up all over the country in pharmacies, drugstores, and shopping centers. They're usually staffed by nurse practitioners or physician assistants that have been trained to treat common illnesses and complaints. They're usually fairly quick and inexpensive. However, if you have serious medical issues or chronic medical problems, these are probably not your best option.  No Primary Care Doctor: - Call Health Connect at  [phone] - they can help you locate a primary care doctor that  accepts your insurance, provides certain services, etc. - Physician Referral Service- [phone]  Chronic Pain Problems: Organization         Address  Phone   Notes  [facility]  [phone] Patients need to be referred by their primary care doctor.   Medication Assistance: Organization         Address  Phone   Notes  [facility] Medication [facility] [place], [place] [address] [phone] --Must be a resident of [facility] -- Must have NO insurance coverage whatsoever (no Medicaid/ Medicare, etc.) -- The pt. MUST have a primary care doctor that directs their care regularly and follows them in the community   MedAssist  [phone]   [employer]  [phone]    Agencies that provide inexpensive medical care: Organization         Address  Phone   Notes  [facility]  [phone]   [facility]    [phone]   [facility] [place], [place] [address] [phone]   [place] [address] [place] [phone]   Planned Parenthood    [phone]   [facility]    [phone]   [place] and [place], [place] Phone:  [phone], Fax:  [phone] Hours of Operation:  9 am - 6 pm, M-F.  Also accepts Medicaid/Medicare and self-pay.  [facility] [place], [address], [place] Phone: [phone], Fax: [phone]. Hours of Operation:  8:30 am - 5:30 pm, M-F.  Also accepts Medicaid and self-pay.  [facility] [address], [employer] Phone: [phone]   [place], [place]  [place], [place] [phone], Ext. [phone] Mondays & Thursdays: 7-9 AM.  First 15 patients are seen on a first come, first serve basis.    [place] Providers:  Organization         Address  Phone   Notes  [facility] [address], [place] [phone] Also accepts self-pay patients.  [facility] [id number] [place], [place]  [phone]   [place], [address], [place] [phone]   [facility] [address] [place] [phone]   Lucianne Lei [address], [place]   [phone] Only accepts [place] Access [place] patients after they have their name applied to their card.   Self-Pay (no insurance) in [facility]:  Organization         Address  Phone   Notes  Sickle Cell Patients, [facility] Internal Medicine [place] [phone]   [facility] [place] [phone]   [facility] [place]  [place], [place], [place] [phone]   [facility]/Dr. Osei-Bonsu  [address] [place] or [address], [place] [phone] Phone number for both [name] and [place] locations is the same.  [facility] [address], [place] [phone]   [facility] [address], [place] or [address] [phone]   [facility] [address] [place] [phone], phone; [phone], fax Sees patients 1st and 3rd Saturday of every month.  Must not qualify for public or private insurance (i.e. Medicaid, Medicare, [place] Health Choice, Veterans' Benefits)  Household income should be no more than 200% of the poverty level The clinic cannot treat you if you are pregnant or think you are pregnant  Sexually transmitted diseases are not treated at the clinic.    Dental Care: Organization         Address  Phone  Notes  [facility] Department of [facility] [place] [phone] Accepts children up to age 69 who are enrolled in [place] or [place]; pregnant women with a Medicaid card; and children who have applied for Medicaid or [place] Health Choice, but were declined, whose parents can pay a reduced fee at time of service.  [facility] Department of [facility]  [address], [place] [phone] Accepts children up to age 6 who are enrolled in [place] or [place]; pregnant women with a Medicaid card; and children who have applied for Medicaid or [place] Health Choice, but were declined, whose parents can pay a reduced fee at time of service.  [facility] Access PRO[facility]  [place] [phone] Patients are seen by appointment only. Walk-ins are not accepted. [place] will see patients 53 years of age and older. Monday - Tuesday (8am-5pm) Most Wednesdays (8:30-5pm) $30 per visit, cash only  [facility] Adult Dental Access PROGRAM  [address], [facility] [phone] Patients are seen by appointment only. Walk-ins are not accepted. [place] will see patients 70 years of age and older. One Wednesday Evening (Monthly: Volunteer Based).  $30 per visit, cash only  [employer]  (  [phone] for adults; Children under age 81, call Graduate Pediatric Dentistry at [phone]. Children aged 13-14, please call [phone] to request a pediatric application.  Dental services are provided in all areas of dental care including fillings, crowns and bridges, complete and partial dentures, implants, gum treatment, root canals, and extractions. Preventive care is also provided. Treatment is provided to both adults and children. Patients are selected via a lottery and there is often a waiting list.   [facility] [address] [place]  [phone] [email or web address]   [facility] [address] [place], [place] [phone], Ext. [phone] Second and Fourth Thursday of each month, opens at 6:30 AM; Clinic ends at 9 AM.  Patients are seen on a first-come first-served basis, and a limited number are seen during each clinic.   [facility]  [address] Hillard Danker [place], [place] [phone]   Eligibility Requirements You must have lived in [place], [place], or [place] counties for at least the last three months.   You cannot be eligible for state or federal sponsored [employer], including [employer], [place], or [employer].   You generally cannot be eligible for healthcare insurance through your employer.    How to apply: Eligibility screenings are held every Tuesday and Wednesday afternoon  from 1:00 pm until 4:00 pm. You do not need an appointment for the interview!  [facility] [address], [place], [place]   [place]  [place] Department  [place]  [phone]    Behavioral Health Resources in the Community: Intensive Outpatient Programs Organization         Address  Phone  Notes  [place]. [address] [place], [place] [phone]   [facility] Outpatient [address] [place], [place]   ADS: Alcohol & Drug [facility] [address] [place], [place]   [place] [address],  [place], [place] or [phone]   Substance Abuse Resources Organization         Address  Phone  Notes  Alcohol and Drug Services  [phone]   [place]  [phone]   [facility] [place]   Chinita Pester  [phone]   Residential & Outpatient Substance Abuse Program  [phone]   Psychological Services Organization         Address  Phone  Notes  [facility] [place]  [place]  [phone]   [place] [address], [place] or [phone]    Mobile Crisis Teams Organization         Address  Phone  Notes  [facility], Mobile Crisis Care Unit  [phone]   [facility] Psychotherapeutic Services  [address] [place], [place]   [place] Levels [address], [place] [phone]    Self-Help/Support Groups Organization         Address  Phone             Notes  [place]. of [place] - variety of support groups  [place] Call for more information  Narcotics Anonymous (NA), [facility] [address], [employer] [place]  2 meetings at this location   Cytogeneticist  Notes  [facility] [address]    [name]  [phone]   [facility]  [address] [place] [id number], [place], [place]   [place], [place] [phone] Admissions: 8am-3pm M-F  Incentives Substance [place] [address],    [place], [place] [phone]   [facility] [address] [place], [place], [place]   [facility] [address]  [place], [place]   [facility] - Intensive Outpatient [address] Kristeen Mans [address], [place], [place]   [facility] ([place] [place].,  [place], [place] [phone] or [phone]   [facility]idential Treatment Services (RTS) [address], [place], [place] Accepts Medicaid  [facility] [address]  [place] [phone] Substance Abuse/Addiction Treatment   [facility] Organization         Address  Phone  Notes  [facility]  [phone]   Domenic Schwab, PhD [address] Arlis [place], [place]   [phone] or [phone]   [place], [place] [phone]   [facility] [address] [place], [place] [phone] Insurance/Medicaid/sponsorship through [facility] and Families [address], [address] [place]                                    [place], [place] [phone] [place] [address], [place] [phone]    Dr. Adele Schilder  [phone]   [facility] Dept. 1) [address], [place] 2) [place] 3)  [place] [address], [place] [phone]  [phone]   [place] [phone] or [phone] (After Hours)

## 2015-05-28 NOTE — ED Notes (Signed)
EDP at bedside to speak with pt in re: exam, results and plan of care

## 2015-05-28 NOTE — ED Notes (Signed)
Placed on cont pox monitoring with int NBP, also placed on cont cardiac monitoring

## 2015-05-28 NOTE — Progress Notes (Addendum)
Pt. Arrived to unit via [place] from [place]. Pt. Alert and oriented. No s/s of distress noted. VSS. Pt. Oriented to room and informed of fall prevention plan. Pt. Placed on telemetry box 3e33. CCMD and Buyer, retail notified of pts. Arrival to floor.

## 2015-05-29 DIAGNOSIS — Z954 Presence of other heart-valve replacement: Secondary | ICD-10-CM

## 2015-05-29 LAB — COMPREHENSIVE METABOLIC PANEL
ALBUMIN: 3.5 g/dL (ref 3.5–5.0)
ALK PHOS: 67 U/L (ref 38–126)
ALT: 17 U/L (ref 14–54)
AST: 23 U/L (ref 15–41)
Anion gap: 11 (ref 5–15)
BILIRUBIN TOTAL: 0.4 mg/dL (ref 0.3–1.2)
BUN: 15 mg/dL (ref 6–20)
CO2: 27 mmol/L (ref 22–32)
Calcium: 9.5 mg/dL (ref 8.9–10.3)
Chloride: 99 mmol/L — ABNORMAL LOW (ref 101–111)
Creatinine, Ser: 1.3 mg/dL — ABNORMAL HIGH (ref 0.44–1.00)
GFR calc Af Amer: 53 mL/min — ABNORMAL LOW (ref >60)
GFR calc non Af Amer: 45 mL/min — ABNORMAL LOW (ref >60)
GLUCOSE: 124 mg/dL — AB (ref 65–99)
POTASSIUM: 3.5 mmol/L (ref 3.5–5.1)
Sodium: 137 mmol/L (ref 135–145)
TOTAL PROTEIN: 6.4 g/dL — AB (ref 6.5–8.1)

## 2015-05-29 LAB — INFLUENZA PANEL BY PCR (TYPE A & B)
H1N1 flu by pcr: NOT DETECTED
INFLBPCR: NEGATIVE
Influenza A By PCR: NEGATIVE

## 2015-05-29 LAB — CBC WITH DIFFERENTIAL/PLATELET
BASOS ABS: 0 10*3/uL (ref 0.0–0.1)
Basophils Relative: 0 %
EOS PCT: 0 %
Eosinophils Absolute: 0 10*3/uL (ref 0.0–0.7)
HCT: 33.8 % — ABNORMAL LOW (ref 36.0–46.0)
Hemoglobin: 11.5 g/dL — ABNORMAL LOW (ref 12.0–15.0)
LYMPHS PCT: 9 %
Lymphs Abs: 1.1 10*3/uL (ref 0.7–4.0)
MCH: 32.1 pg (ref 26.0–34.0)
MCHC: 34 g/dL (ref 30.0–36.0)
MCV: 94.4 fL (ref 78.0–100.0)
MONO ABS: 1 10*3/uL (ref 0.1–1.0)
Monocytes Relative: 9 %
Neutro Abs: 9.7 10*3/uL — ABNORMAL HIGH (ref 1.7–7.7)
Neutrophils Relative %: 82 %
PLATELETS: 159 10*3/uL (ref 150–400)
RBC: 3.58 MIL/uL — ABNORMAL LOW (ref 3.87–5.11)
RDW: 13.8 % (ref 11.5–15.5)
WBC: 11.8 10*3/uL — ABNORMAL HIGH (ref 4.0–10.5)

## 2015-05-29 LAB — CBC
HEMATOCRIT: 35.4 % — AB (ref 36.0–46.0)
HEMOGLOBIN: 11.5 g/dL — AB (ref 12.0–15.0)
MCH: 30.7 pg (ref 26.0–34.0)
MCHC: 32.5 g/dL (ref 30.0–36.0)
MCV: 94.4 fL (ref 78.0–100.0)
Platelets: 161 10*3/uL (ref 150–400)
RBC: 3.75 MIL/uL — ABNORMAL LOW (ref 3.87–5.11)
RDW: 13.7 % (ref 11.5–15.5)
WBC: 8.7 10*3/uL (ref 4.0–10.5)

## 2015-05-29 LAB — TROPONIN I: TROPONIN I: 0.03 ng/mL (ref <0.031)

## 2015-05-29 LAB — CREATININE, SERUM
Creatinine, Ser: 1.39 mg/dL — ABNORMAL HIGH (ref 0.44–1.00)
GFR calc Af Amer: 48 mL/min — ABNORMAL LOW (ref >60)
GFR, EST NON AFRICAN AMERICAN: 42 mL/min — AB (ref >60)

## 2015-05-29 LAB — TSH: TSH: 1.096 u[IU]/mL (ref 0.350–4.500)

## 2015-05-29 MED ORDER — LETROZOLE 2.5 MG PO TABS
2.5000 mg | ORAL_TABLET | Freq: Every day | ORAL | Status: DC
  Administered 2015-05-29 – 2015-05-31 (×3): 2.5 mg via ORAL
  Filled 2015-05-29 (×4): qty 1

## 2015-05-29 MED ORDER — METHYLPREDNISOLONE SODIUM SUCC 125 MG IJ SOLR
60.0000 mg | Freq: Two times a day (BID) | INTRAMUSCULAR | Status: DC
  Administered 2015-05-29 – 2015-05-31 (×5): 60 mg via INTRAVENOUS
  Filled 2015-05-29 (×5): qty 2

## 2015-05-29 MED ORDER — CARVEDILOL 12.5 MG PO TABS
12.5000 mg | ORAL_TABLET | Freq: Two times a day (BID) | ORAL | Status: DC
  Administered 2015-05-29 – 2015-06-01 (×7): 12.5 mg via ORAL
  Filled 2015-05-29 (×7): qty 1

## 2015-05-29 MED ORDER — IPRATROPIUM-ALBUTEROL 0.5-2.5 (3) MG/3ML IN SOLN
3.0000 mL | RESPIRATORY_TRACT | Status: DC
Start: 2015-05-29 — End: 2015-05-31
  Administered 2015-05-29 – 2015-05-31 (×15): 3 mL via RESPIRATORY_TRACT
  Filled 2015-05-29 (×15): qty 3

## 2015-05-29 MED ORDER — ARFORMOTEROL TARTRATE 15 MCG/2ML IN NEBU
15.0000 ug | INHALATION_SOLUTION | Freq: Two times a day (BID) | RESPIRATORY_TRACT | Status: DC
  Administered 2015-05-29 – 2015-06-01 (×7): 15 ug via RESPIRATORY_TRACT
  Filled 2015-05-29 (×7): qty 2

## 2015-05-29 MED ORDER — BUDESONIDE 0.5 MG/2ML IN SUSP
0.5000 mg | Freq: Two times a day (BID) | RESPIRATORY_TRACT | Status: DC
  Administered 2015-05-29 – 2015-06-01 (×7): 0.5 mg via RESPIRATORY_TRACT
  Filled 2015-05-29 (×7): qty 2

## 2015-05-29 MED ORDER — PNEUMOCOCCAL VAC POLYVALENT 25 MCG/0.5ML IJ INJ
0.5000 mL | INJECTION | INTRAMUSCULAR | Status: AC
  Administered 2015-05-30: 0.5 mL via INTRAMUSCULAR
  Filled 2015-05-29: qty 0.5

## 2015-05-29 MED ORDER — CETYLPYRIDINIUM CHLORIDE 0.05 % MT LIQD
7.0000 mL | Freq: Two times a day (BID) | OROMUCOSAL | Status: DC
  Administered 2015-05-29 – 2015-06-01 (×5): 7 mL via OROMUCOSAL

## 2015-05-29 NOTE — Progress Notes (Addendum)
[facility] PROGRESS NOTE  [name] [id number] DOB: 10/02/1960 DOA: 05/28/2015 PCP: No PCP Per Patient  Brief Summary  [name] is a 54 y.o. female with history of chronic systolic heart failure last year measured was 35%, AICD placement, tissue mitral valve replacement, chronic kidney disease presents to ER with complaints of shortness of breath.  Patient has been experiencing shortness of breath over the last 2 days with productive cough.  Patient come to the ER earlier and was discharged home on prednisone and antibiotics despite the patient's wheezing worsened and came to the ER. Chest x-ray shows bronchitis features and on exam patient has bilateral expiratory wheeze. Patient's shortness of breath increases on exertion. On exam patient does not have any signs of fluid overload. Denies any fever chills chest pain nausea vomiting or abdominal pain. Patient has been admitted for acute bronchitis.    Assessment/Plan  Acute hypoxic respiratory failure secondary to reactive airway disease, however I suspect she has underlying COPD because she has had frequent bronchitis over the last several years and is a smoker.  Chest x-ray demonstrated some hyperinflation.  -  Continue soluMedrol -  Continue doxycycline -  Continue Pulmicort and Brovana with duonebs every 4 hours -  Add flutter valve  Chronic systolic heart failure with ejection fraction of 35% last in July 2006  -  Appears euvolemic, no evidence of interstitial edema or effusions on chest x-ray, no lower extremity edema -  If minimal improvement with steroids and breathing treatments by tomorrow, however, would try some IV diuresis -  Continue Lasix and spironolactone -  Continue beta blocker -  No ACEI due to kidney function  -  Daily weights and strict I/O  Hx of VF s/p pacemaker/ICD placement  CKD stage III, creatinine appears to be near baseline of 1.3  Chronic anemia, likely anemia of chronic  disease  Status post bovine mitral valve replacement, not on anticoagulation  Ovarian cancer followed by Dr. Marin Olp, continue letrozole  Diet:  Healthy heart Access:  PIV IVF:  off Proph:  lovenox  Code Status: full Family Communication: patient alone Disposition Plan:  Pending improvement in breathing   Consultants:  none  Procedures:  none  Antibiotics:  Doxycycline 12/4 >    HPI/Subjective:  Still very SOB, minimal to no improvement since admission.  Breathing treatments not helping much.     Objective: Filed Vitals:   05/29/15 0732 05/29/15 0935 05/29/15 1149 05/29/15 1511  BP:  111/69    Pulse:  90    Temp:  97.8 F (36.6 C)    TempSrc:  Oral    Resp:  18    Height:      Weight:      SpO2: 97% 98% 98% 97%    Intake/Output Summary (Last 24 hours) at 05/29/15 1744 Last data filed at 05/29/15 1424  Gross per 24 hour  Intake   1550 ml  Output   1800 ml  Net   -250 ml   Filed Weights   05/28/15 2239 05/29/15 0432  Weight: 97.796 kg (215 lb 9.6 oz) 98.068 kg (216 lb 3.2 oz)   Body mass index is 34.91 kg/(m^2).  Exam:   General:  Adult female, No acute distress  HEENT:  NCAT, MMM  Cardiovascular:  RRR, nl S1, S2 no mrg, 2+ pulses, warm extremities  Respiratory:  Musical wheeze, diminished only at the bases, no focal rales or rhonchi, no increased WOB  Abdomen:   NABS, soft, NT/ND  MSK:   Normal tone and bulk, no LEE  Neuro:  Grossly intact  Data Reviewed: Basic Metabolic Panel:  Recent Labs Lab 05/28/15 0725 05/29/15 0001 05/29/15 0505  NA 140  --  137  K 3.9  --  3.5  CL 105  --  99*  CO2 26  --  27  GLUCOSE 102*  --  124*  BUN 19  --  15  CREATININE 1.31* 1.39* 1.30*  CALCIUM 9.1  --  9.5   Liver Function Tests:  Recent Labs Lab 05/28/15 0725 05/29/15 0505  AST 24 23  ALT 14 17  ALKPHOS 73 67  BILITOT 0.7 0.4  PROT 6.9 6.4*  ALBUMIN 3.9 3.5   No results for input(s): LIPASE, AMYLASE in the last 168 hours. No  results for input(s): AMMONIA in the last 168 hours. CBC:  Recent Labs Lab 05/28/15 0725 05/29/15 0001 05/29/15 0505  WBC 8.5 8.7 11.8*  NEUTROABS 6.0  --  9.7*  HGB 11.8* 11.5* 11.5*  HCT 35.9* 35.4* 33.8*  MCV 95.0 94.4 94.4  PLT 142* 161 159    No results found for this or any previous visit (from the past 240 hour(s)).   Studies: Dg Chest 2 View  05/28/2015  CLINICAL DATA:  Worsening chest pain and shortness of breath. EXAM: CHEST  2 VIEW COMPARISON:  Earlier today. FINDINGS: The heart remains normal in size with stable median sternotomy wires, prosthetic mitral valve and left subclavian pacer and AICD leads. Clear lungs with normal vascularity. Mild central peribronchial thickening. Mild thoracic spine degenerative changes. IMPRESSION: Mild bronchitic changes.  No acute abnormality. Electronically Signed   By: Claudie Revering M.D.   On: 05/28/2015 18:10   Dg Chest 2 View  05/28/2015  CLINICAL DATA:  Shortness of breath and productive cough for 3 days, congestion. History of CHF, asthma, mitral valve replacement, defibrillator. EXAM: CHEST  2 VIEW COMPARISON:  Chest x-ray dated 03/10/2013. FINDINGS: Heart size is upper normal, unchanged. Median sternotomy wires appear intact and stable in alignment. Mitral valve replacement hardware appears stable in position. Left chest wall pacemaker/AICD is stable in position. Lungs are clear. No evidence of CHF. No evidence of pneumonia. No pleural effusion seen. No pneumothorax seen. Again noted are degenerative changes of the left shoulder with associated calcified loose bodies and/or sequela of chronic calcific tendinopathy. No acute- appearing osseous abnormality. IMPRESSION: Stable chest x-ray. No evidence of acute cardiopulmonary abnormality. Electronically Signed   By: Franki Cabot M.D.   On: 05/28/2015 08:02    Scheduled Meds: . arformoterol  15 mcg Nebulization BID  . aspirin  325 mg Oral q morning - 10a  . atorvastatin  40 mg Oral QPM  .  budesonide (PULMICORT) nebulizer solution  0.5 mg Nebulization BID  . carvedilol  12.5 mg Oral BID WC  . cholecalciferol  400 Units Oral BID  . doxycycline (VIBRAMYCIN) IV  100 mg Intravenous Q12H  . enoxaparin (LOVENOX) injection  40 mg Subcutaneous Q24H  . furosemide  20 mg Oral Daily  . ipratropium-albuterol  3 mL Nebulization Q4H  . letrozole  2.5 mg Oral Daily  . levothyroxine  75 mcg Oral QAC breakfast  . methylPREDNISolone (SOLU-MEDROL) injection  60 mg Intravenous BID  . omega-3 acid ethyl esters  1 g Oral Daily  . [START ON 05/30/2015] pneumococcal 23 valent vaccine  0.5 mL Intramuscular Tomorrow-1000  . sodium chloride  3 mL Intravenous Q12H  . spironolactone  25 mg Oral BID  . zolpidem  5 mg Oral QHS   Continuous Infusions:   Principal Problem:   Acute bronchitis Active Problems:   Chronic systolic HF (heart failure) (HCC)   S/P MVR (mitral valve replacement)   Automatic implantable cardioverter-defibrillator in situ   Granulosa cell tumor of ovary   Hypoxia   CKD (chronic kidney disease) stage 3, GFR 30-59 ml/min   Chronic anemia    Time spent: 30 min    Ewan Grau, [place] [facility] Pager [phone]. If 7PM-7AM, please contact night-coverage at [email or web address], password [facility] 05/29/2015, 5:44 PM  LOS: 1 day

## 2015-05-30 LAB — BASIC METABOLIC PANEL
ANION GAP: 9 (ref 5–15)
BUN: 19 mg/dL (ref 6–20)
CALCIUM: 9.5 mg/dL (ref 8.9–10.3)
CO2: 26 mmol/L (ref 22–32)
Chloride: 101 mmol/L (ref 101–111)
Creatinine, Ser: 1.17 mg/dL — ABNORMAL HIGH (ref 0.44–1.00)
GFR, EST AFRICAN AMERICAN: 60 mL/min — AB (ref >60)
GFR, EST NON AFRICAN AMERICAN: 51 mL/min — AB (ref >60)
Glucose, Bld: 129 mg/dL — ABNORMAL HIGH (ref 65–99)
Potassium: 4.1 mmol/L (ref 3.5–5.1)
SODIUM: 136 mmol/L (ref 135–145)

## 2015-05-30 LAB — CBC
HCT: 34.9 % — ABNORMAL LOW (ref 36.0–46.0)
HEMOGLOBIN: 11.5 g/dL — AB (ref 12.0–15.0)
MCH: 31.3 pg (ref 26.0–34.0)
MCHC: 33 g/dL (ref 30.0–36.0)
MCV: 95.1 fL (ref 78.0–100.0)
Platelets: 171 10*3/uL (ref 150–400)
RBC: 3.67 MIL/uL — AB (ref 3.87–5.11)
RDW: 13.6 % (ref 11.5–15.5)
WBC: 15.6 10*3/uL — AB (ref 4.0–10.5)

## 2015-05-30 MED ORDER — DOXYCYCLINE HYCLATE 100 MG PO TABS
100.0000 mg | ORAL_TABLET | Freq: Two times a day (BID) | ORAL | Status: DC
  Administered 2015-05-30 – 2015-06-01 (×4): 100 mg via ORAL
  Filled 2015-05-30 (×4): qty 1

## 2015-05-30 MED ORDER — FUROSEMIDE 10 MG/ML IJ SOLN
40.0000 mg | Freq: Two times a day (BID) | INTRAMUSCULAR | Status: DC
  Administered 2015-05-30 – 2015-05-31 (×2): 40 mg via INTRAVENOUS
  Filled 2015-05-30 (×2): qty 4

## 2015-05-30 MED ORDER — FUROSEMIDE 10 MG/ML IJ SOLN
40.0000 mg | Freq: Once | INTRAMUSCULAR | Status: AC
  Administered 2015-05-30: 40 mg via INTRAVENOUS
  Filled 2015-05-30: qty 4

## 2015-05-30 NOTE — Progress Notes (Signed)
[facility] PROGRESS NOTE  [name] [id number] DOB: 1960/11/17 DOA: 05/28/2015 PCP: No PCP Per Patient  Brief Summary  [name] is a 54 y.o. female with history of chronic systolic heart failure last year measured was 35%, AICD placement, bovine mitral valve replacement, chronic kidney disease presented to ER with progressive shortness of breath x 2 days.  She had come to the ER earlier and was discharged home on prednisone and antibiotics, but had worsening wheezing so she came back to the ER. Chest x-ray demonstrated bronchitic features.  On exam patient did not have any signs of fluid overload.   Assessment/Plan  Acute hypoxic respiratory failure secondary to reactive airway disease, however I suspect she has underlying COPD because she has had frequent bronchitis over the last several years and is a smoker.  Chest x-ray demonstrated some hyperinflation.  She has had minimal improvement with typical treatment for RAD, however, and so I wonder if she has a component of heart failure also -  Continue solumedrol -  Continue doxycycline -  Continue Pulmicort and Brovana with duonebs every 4 hours -  Add flutter valve  Possible acute on chronic systolic heart failure with ejection fraction of 35% last in July 2006.   -  Start lasix 40mg  IV BID -  Continue spironolactone -  Continue beta blocker -  No ACEI due to kidney function  -  Daily weights and strict I/O  Hx of VF s/p pacemaker/ICD placement  CKD stage III, creatinine appears to be near baseline of 1.3 -  Repeat BMP in AM   Chronic anemia, likely anemia of chronic disease  Status post bovine mitral valve replacement, not on anticoagulation  Ovarian cancer followed by Dr. Marin Olp, continue letrozole  Leukocytosis, likely due to steroids  Diet:  Healthy heart Access:  PIV IVF:  off Proph:  lovenox  Code Status: full Family Communication: patient alone Disposition Plan:  Pending improvement in  breathing   Consultants:  none  Procedures:  none  Antibiotics:  Doxycycline 12/4 >    HPI/Subjective:  Still very SOB at rest.  Breathing treatments not helping as far as she can tell.    Objective: Filed Vitals:   05/30/15 0320 05/30/15 0416 05/30/15 0752 05/30/15 1243  BP:  107/66  96/72  Pulse: 93 90  87  Temp:  98.4 F (36.9 C)  98 F (36.7 C)  TempSrc:  Oral  Oral  Resp: 18 18  18   Height:      Weight:  97.387 kg (214 lb 11.2 oz)    SpO2: 94% 98% 96% 94%    Intake/Output Summary (Last 24 hours) at 05/30/15 1638 Last data filed at 05/30/15 1530  Gross per 24 hour  Intake   1340 ml  Output   4350 ml  Net  -3010 ml   Filed Weights   05/28/15 2239 05/29/15 0432 05/30/15 0416  Weight: 97.796 kg (215 lb 9.6 oz) 98.068 kg (216 lb 3.2 oz) 97.387 kg (214 lb 11.2 oz)   Body mass index is 34.67 kg/(m^2).  Exam:   General:  Adult female, wheezy when talking, forced exhalations  HEENT:  NCAT, MMM  Cardiovascular:  RRR, nl S1, S2 no mrg, 2+ pulses, warm extremities  Respiratory:  Diminished only at the bases, end-expiratory wheeze, no focal rales or rhonchi  Abdomen:   NABS, soft, NT/ND  MSK:   Normal tone and bulk, no LEE  Neuro:  Grossly intact  Data Reviewed: Basic Metabolic Panel:  Recent Labs Lab 05/28/15 0725 05/29/15 0001 05/29/15 0505 05/30/15 0410  NA 140  --  137 136  K 3.9  --  3.5 4.1  CL 105  --  99* 101  CO2 26  --  27 26  GLUCOSE 102*  --  124* 129*  BUN 19  --  15 19  CREATININE 1.31* 1.39* 1.30* 1.17*  CALCIUM 9.1  --  9.5 9.5   Liver Function Tests:  Recent Labs Lab 05/28/15 0725 05/29/15 0505  AST 24 23  ALT 14 17  ALKPHOS 73 67  BILITOT 0.7 0.4  PROT 6.9 6.4*  ALBUMIN 3.9 3.5   No results for input(s): LIPASE, AMYLASE in the last 168 hours. No results for input(s): AMMONIA in the last 168 hours. CBC:  Recent Labs Lab 05/28/15 0725 05/29/15 0001 05/29/15 0505 05/30/15 0410  WBC 8.5 8.7 11.8* 15.6*   NEUTROABS 6.0  --  9.7*  --   HGB 11.8* 11.5* 11.5* 11.5*  HCT 35.9* 35.4* 33.8* 34.9*  MCV 95.0 94.4 94.4 95.1  PLT 142* 161 159 171    No results found for this or any previous visit (from the past 240 hour(s)).   Studies: Dg Chest 2 View  05/28/2015  CLINICAL DATA:  Worsening chest pain and shortness of breath. EXAM: CHEST  2 VIEW COMPARISON:  Earlier today. FINDINGS: The heart remains normal in size with stable median sternotomy wires, prosthetic mitral valve and left subclavian pacer and AICD leads. Clear lungs with normal vascularity. Mild central peribronchial thickening. Mild thoracic spine degenerative changes. IMPRESSION: Mild bronchitic changes.  No acute abnormality. Electronically Signed   By: Claudie Revering M.D.   On: 05/28/2015 18:10    Scheduled Meds: . antiseptic oral rinse  7 mL Mouth Rinse BID  . arformoterol  15 mcg Nebulization BID  . aspirin  325 mg Oral q morning - 10a  . atorvastatin  40 mg Oral QPM  . budesonide (PULMICORT) nebulizer solution  0.5 mg Nebulization BID  . carvedilol  12.5 mg Oral BID WC  . cholecalciferol  400 Units Oral BID  . doxycycline  100 mg Oral Q12H  . enoxaparin (LOVENOX) injection  40 mg Subcutaneous Q24H  . furosemide  40 mg Intravenous BID WC  . ipratropium-albuterol  3 mL Nebulization Q4H  . letrozole  2.5 mg Oral Daily  . levothyroxine  75 mcg Oral QAC breakfast  . methylPREDNISolone (SOLU-MEDROL) injection  60 mg Intravenous BID  . omega-3 acid ethyl esters  1 g Oral Daily  . sodium chloride  3 mL Intravenous Q12H  . spironolactone  25 mg Oral BID  . zolpidem  5 mg Oral QHS   Continuous Infusions:   Principal Problem:   Acute bronchitis Active Problems:   Chronic systolic HF (heart failure) (HCC)   S/P MVR (mitral valve replacement)   Automatic implantable cardioverter-defibrillator in situ   Granulosa cell tumor of ovary   Hypoxia   CKD (chronic kidney disease) stage 3, GFR 30-59 ml/min   Chronic anemia    Time  spent: 30 min    Shahab Polhamus, [facility] Pager [phone]. If 7PM-7AM, please contact night-coverage at [email or web address], password [facility] 05/30/2015, 4:38 PM  LOS: 2 days

## 2015-05-31 DIAGNOSIS — I5022 Chronic systolic (congestive) heart failure: Secondary | ICD-10-CM

## 2015-05-31 DIAGNOSIS — J209 Acute bronchitis, unspecified: Secondary | ICD-10-CM

## 2015-05-31 DIAGNOSIS — N183 Chronic kidney disease, stage 3 (moderate): Secondary | ICD-10-CM

## 2015-05-31 LAB — BASIC METABOLIC PANEL
Anion gap: 13 (ref 5–15)
BUN: 30 mg/dL — AB (ref 6–20)
CALCIUM: 9.6 mg/dL (ref 8.9–10.3)
CHLORIDE: 96 mmol/L — AB (ref 101–111)
CO2: 27 mmol/L (ref 22–32)
CREATININE: 1.38 mg/dL — AB (ref 0.44–1.00)
GFR calc Af Amer: 49 mL/min — ABNORMAL LOW (ref >60)
GFR, EST NON AFRICAN AMERICAN: 42 mL/min — AB (ref >60)
GLUCOSE: 113 mg/dL — AB (ref 65–99)
Potassium: 3.8 mmol/L (ref 3.5–5.1)
SODIUM: 136 mmol/L (ref 135–145)

## 2015-05-31 LAB — BRAIN NATRIURETIC PEPTIDE: B NATRIURETIC PEPTIDE 5: 256.8 pg/mL — AB (ref 0.0–100.0)

## 2015-05-31 MED ORDER — FUROSEMIDE 40 MG PO TABS
40.0000 mg | ORAL_TABLET | Freq: Every day | ORAL | Status: DC
  Administered 2015-06-01: 40 mg via ORAL
  Filled 2015-05-31: qty 1

## 2015-05-31 MED ORDER — PREDNISONE 20 MG PO TABS
40.0000 mg | ORAL_TABLET | Freq: Two times a day (BID) | ORAL | Status: DC
  Administered 2015-05-31: 40 mg via ORAL
  Filled 2015-05-31: qty 2

## 2015-05-31 MED ORDER — IPRATROPIUM-ALBUTEROL 0.5-2.5 (3) MG/3ML IN SOLN
3.0000 mL | Freq: Four times a day (QID) | RESPIRATORY_TRACT | Status: DC
  Administered 2015-05-31 – 2015-06-01 (×4): 3 mL via RESPIRATORY_TRACT
  Filled 2015-05-31 (×4): qty 3

## 2015-05-31 MED ORDER — GUAIFENESIN-DM 100-10 MG/5ML PO SYRP: 5.0000 mL | ORAL_SOLUTION | ORAL | Status: DC | PRN

## 2015-05-31 NOTE — Progress Notes (Signed)
[facility] PROGRESS NOTE  LEN QUINONES [id number] DOB: 11/16/1960 DOA: 05/28/2015 PCP: No PCP Per Patient  Brief Summary  [name] is a 54 y.o. female with history of chronic systolic heart failure last year measured was 35%, AICD placement, bovine mitral valve replacement, chronic kidney disease presented to ER with progressive shortness of breath x 2 days.  She had come to the ER earlier and was discharged home on prednisone and antibiotics, but had worsening wheezing so she came back to the ER. Chest x-ray demonstrated bronchitic features.  On exam patient did not have any signs of fluid overload.   Assessment/Plan  Acute hypoxic respiratory failure secondary to reactive airway disease, with possible component of acute systolic heart failure. Patient has improved, but remains dyspneic on exertion. No wheeze currently. Will change Solu-Medrol to prednisone. Change Lasix to by mouth. Check echocardiogram to rule out worsening ejection fraction. Check BNP. Her weight is actually down from previous. Walk in halls today  Possible acute on chronic systolic heart failure with ejection fraction of 35% January 2016. See above. No ACE inhibitor due to renal insufficiency.  On carvedilol and spironolactone.  Hx of VF s/p pacemaker/ICD placement  CKD stage III, creatinine appears to be near baseline of 1.3 Monitor  Chronic anemia, likely anemia of chronic disease  Status post bovine mitral valve replacement, not on anticoagulation  Ovarian cancer followed by Dr. Marin Olp, continue letrozole  Diet:  Healthy heart Access:  PIV IVF:  off Proph:  lovenox  Code Status: full Family Communication: mother at bedside Disposition Plan:  Home 1-2 days if echo ok and breathing improving   Consultants:  none  Procedures:  none  Antibiotics:  Doxycycline 12/4 >    HPI/Subjective: Breathing easier. Complaining of cough. Gets winded with minimal exertion, even up to  bathroom. Has multiple questions and is worried about her heart.  Objective: Filed Vitals:   05/30/15 2157 05/30/15 2341 05/31/15 0341 05/31/15 0446  BP: 105/62   111/78  Pulse: 85 84 88 88  Temp: 97.8 F (36.6 C)   97.6 F (36.4 C)  TempSrc: Oral   Oral  Resp: 18 18 18 20   Height:      Weight:    96.934 kg (213 lb 11.2 oz)  SpO2: 98% 93% 94% 93%    Intake/Output Summary (Last 24 hours) at 05/31/15 1152 Last data filed at 05/31/15 0921  Gross per 24 hour  Intake   1200 ml  Output   5550 ml  Net  -4350 ml   Filed Weights   05/29/15 0432 05/30/15 0416 05/31/15 0446  Weight: 98.068 kg (216 lb 3.2 oz) 97.387 kg (214 lb 11.2 oz) 96.934 kg (213 lb 11.2 oz)   Body mass index is 34.51 kg/(m^2).  Exam:   General:  Talkative. Appears comfortable at rest. Alert and oriented.  Cardiovascular:  RRR, no murmurs gallops rubs  Respiratory:  Clear to auscultation bilaterally without wheezes rhonchi or rales  Abdomen:   NABS, soft, NT/ND  Extremities:   No clubbing cyanosis or edema  Data Reviewed: Basic Metabolic Panel:  Recent Labs Lab 05/28/15 0725 05/29/15 0001 05/29/15 0505 05/30/15 0410 05/31/15 0358  NA 140  --  137 136 136  K 3.9  --  3.5 4.1 3.8  CL 105  --  99* 101 96*  CO2 26  --  27 26 27   GLUCOSE 102*  --  124* 129* 113*  BUN 19  --  15 19 30*  CREATININE 1.31* 1.39* 1.30* 1.17* 1.38*  CALCIUM 9.1  --  9.5 9.5 9.6   Liver Function Tests:  Recent Labs Lab 05/28/15 0725 05/29/15 0505  AST 24 23  ALT 14 17  ALKPHOS 73 67  BILITOT 0.7 0.4  PROT 6.9 6.4*  ALBUMIN 3.9 3.5   No results for input(s): LIPASE, AMYLASE in the last 168 hours. No results for input(s): AMMONIA in the last 168 hours. CBC:  Recent Labs Lab 05/28/15 0725 05/29/15 0001 05/29/15 0505 05/30/15 0410  WBC 8.5 8.7 11.8* 15.6*  NEUTROABS 6.0  --  9.7*  --   HGB 11.8* 11.5* 11.5* 11.5*  HCT 35.9* 35.4* 33.8* 34.9*  MCV 95.0 94.4 94.4 95.1  PLT 142* 161 159 171    No  results found for this or any previous visit (from the past 240 hour(s)).   Studies: No results found.  Scheduled Meds: . antiseptic oral rinse  7 mL Mouth Rinse BID  . arformoterol  15 mcg Nebulization BID  . aspirin  325 mg Oral q morning - 10a  . atorvastatin  40 mg Oral QPM  . budesonide (PULMICORT) nebulizer solution  0.5 mg Nebulization BID  . carvedilol  12.5 mg Oral BID WC  . cholecalciferol  400 Units Oral BID  . doxycycline  100 mg Oral Q12H  . enoxaparin (LOVENOX) injection  40 mg Subcutaneous Q24H  . [START ON 06/01/2015] furosemide  40 mg Oral Daily  . ipratropium-albuterol  3 mL Nebulization Q4H  . letrozole  2.5 mg Oral Daily  . levothyroxine  75 mcg Oral QAC breakfast  . omega-3 acid ethyl esters  1 g Oral Daily  . predniSONE  40 mg Oral BID WC  . sodium chloride  3 mL Intravenous Q12H  . spironolactone  25 mg Oral BID  . zolpidem  5 mg Oral QHS   Continuous Infusions:   Principal Problem:   Acute bronchitis Active Problems:   Chronic systolic HF (heart failure) (HCC)   S/P MVR (mitral valve replacement)   Automatic implantable cardioverter-defibrillator in situ   Granulosa cell tumor of ovary   Hypoxia   CKD (chronic kidney disease) stage 3, GFR 30-59 ml/min   Chronic anemia  Time spent: 30 min  [facility] [email or web address], password [facility] 05/31/2015, 11:52 AM  LOS: 3 days

## 2015-06-01 ENCOUNTER — Inpatient Hospital Stay (HOSPITAL_COMMUNITY): Payer: 59

## 2015-06-01 DIAGNOSIS — J208 Acute bronchitis due to other specified organisms: Secondary | ICD-10-CM

## 2015-06-01 DIAGNOSIS — R0600 Dyspnea, unspecified: Secondary | ICD-10-CM

## 2015-06-01 LAB — BASIC METABOLIC PANEL
Anion gap: 12 (ref 5–15)
BUN: 36 mg/dL — AB (ref 6–20)
CALCIUM: 9.5 mg/dL (ref 8.9–10.3)
CHLORIDE: 97 mmol/L — AB (ref 101–111)
CO2: 28 mmol/L (ref 22–32)
CREATININE: 1.36 mg/dL — AB (ref 0.44–1.00)
GFR calc non Af Amer: 43 mL/min — ABNORMAL LOW (ref >60)
GFR, EST AFRICAN AMERICAN: 50 mL/min — AB (ref >60)
GLUCOSE: 122 mg/dL — AB (ref 65–99)
Potassium: 3.7 mmol/L (ref 3.5–5.1)
Sodium: 137 mmol/L (ref 135–145)

## 2015-06-01 MED ORDER — PREDNISONE 20 MG PO TABS
40.0000 mg | ORAL_TABLET | Freq: Two times a day (BID) | ORAL | Status: DC
  Administered 2015-06-01: 40 mg via ORAL
  Filled 2015-06-01: qty 2

## 2015-06-01 NOTE — Progress Notes (Signed)
  Echocardiogram 2D Echocardiogram has been performed.  [name] 06/01/2015, 10:53 AM

## 2015-06-01 NOTE — Progress Notes (Signed)
Pt d/c'd home with family. D/c instructions given to and d/w pt. Pt verbalizes understanding

## 2015-06-01 NOTE — Discharge Summary (Signed)
Physician Discharge Summary  [name] [id number] DOB: 08-Nov-1960 DOA: 05/28/2015  PCP: No PCP Per Patient  Admit date: 05/28/2015 Discharge date: 06/01/2015  Time spent: greater than 30 minutes  Recommendations for Outpatient Follow-up:  1.    Discharge Diagnoses:  Principal Problem:   Acute bronchitis with bronchospasm Active Problems:   Chronic systolic HF (heart failure) (HCC)   S/P MVR (mitral valve replacement)   Automatic implantable cardioverter-defibrillator in situ   Granulosa cell tumor of ovary   Acute hypoxic respiratory failure   CKD (chronic kidney disease) stage 3, GFR 30-59 ml/min   Chronic anemia  Discharge Condition: stable  Diet recommendation: heart healthy  Filed Weights   05/30/15 0416 05/31/15 0446 06/01/15 0541  Weight: 97.387 kg (214 lb 11.2 oz) 96.934 kg (213 lb 11.2 oz) 95.482 kg (210 lb 8 oz)    History of present illness/Hospital Course:  54 y.o. female with history of chronic systolic heart failure last year measured was 35%, AICD placement, bovine mitral valve replacement, chronic kidney disease presented to ER with progressive shortness of breath x 2 days. She had come to the ER earlier and was discharged home on prednisone and antibiotics, but had worsening wheezing so she came back to the ER. Chest x-ray demonstrated bronchitic features. On exam patient did not have any signs of fluid overload.   Acute hypoxic respiratory failure secondary to acute bronchitis withreactive airway disease, improved slowly but steadily on steroids, antibiotics, bronchodilators.    Possible acute on chronic systolic heart failure with ejection fraction of 35% January 2016.  Given IV lasix with good urine output.  bnp 400.  Repeat echo with improved EF 45% - 50% and bioprosthesis unchanged. No ACE inhibitor due to renal insufficiency. On carvedilol and spironolactone.  Hx of VF s/p pacemaker/ICD placement  CKD stage III, stable  Chronic anemia, no  evidence of bleeding  Status post bovine mitral valve replacement,   Ovarian cancer followed by Dr. Marin Olp, continue letrozole   Procedures:  none  Consultations:  none  Discharge Exam: Filed Vitals:   06/01/15 0727 06/01/15 1119  BP:  118/68  Pulse: 85 82  Temp:  98 F (36.7 C)  Resp: 20     General: comfortable. A and o Cardiovascular: RRR Respiratory: CTA Ext no edema  Discharge Instructions   Discharge Instructions    Diet - low sodium heart healthy    Complete by:  As directed      Increase activity slowly    Complete by:  As directed           Current Discharge Medication List    CONTINUE these medications which have NOT CHANGED   Details  albuterol (PROVENTIL HFA;VENTOLIN HFA) 108 (90 BASE) MCG/ACT inhaler Inhale 2 puffs into the lungs every 4 (four) hours as needed for wheezing or shortness of breath. Qty: 1 Inhaler, Refills: 1    aspirin 325 MG EC tablet Take 325 mg by mouth every morning.     atorvastatin (LIPITOR) 40 MG tablet Take 1 tablet (40 mg total) by mouth every evening. Qty: 30 tablet, Refills: 4    b complex vitamins tablet Take 1 tablet by mouth daily.    carvedilol (COREG) 12.5 MG tablet TAKE 1 TABLET (12.5 MG TOTAL) BY MOUTH 2 (TWO) TIMES DAILY WITH A MEAL. Qty: 60 tablet, Refills: 11    cholecalciferol (VITAMIN D-400) 400 UNITS TABS tablet Take 400 Units by mouth 2 (two) times daily.    fish oil-omega-3 fatty acids  1000 MG capsule Take 1 g by mouth 2 (two) times daily.    furosemide (LASIX) 20 MG tablet Take 1 tablet (20 mg total) by mouth daily. Qty: 90 tablet, Refills: 3   Associated Diagnoses: Chronic systolic HF (heart failure) ([place]); Paroxysmal ventricular tachycardia ([place]); Automatic implantable cardioverter-defibrillator in situ    letrozole (FEMARA) 2.5 MG tablet Take 1 tablet (2.5 mg total) by mouth daily. Qty: 90 tablet, Refills: 3   Associated Diagnoses: Malignant granulosa cell tumor of ovary, unspecified  laterality (HCC)    levothyroxine (SYNTHROID, LEVOTHROID) 75 MCG tablet TAKE 1 TABLET (75 MCG TOTAL) BY MOUTH DAILY BEFORE BREAKFAST. Qty: 30 tablet, Refills: 3    LORazepam (ATIVAN) 0.5 MG tablet Take 1 tablet (0.5 mg total) by mouth every 6 (six) hours as needed (Nausea or vomiting). Qty: 60 tablet, Refills: 2   Associated Diagnoses: Granulosa cell tumor of ovary, unspecified laterality; Nausea without vomiting    Multiple Vitamins-Minerals (MULTIVITAMIN PO) Take 1 tablet by mouth daily.     spironolactone (ALDACTONE) 25 MG tablet TAKE 1 TABLET (25 MG TOTAL) BY MOUTH 2 (TWO) TIMES DAILY. Qty: 60 tablet, Refills: 11    zolpidem (AMBIEN) 5 MG tablet Take 1 tablet (5 mg total) by mouth at bedtime. Qty: 30 tablet, Refills: 2   Associated Diagnoses: Insomnia due to medical condition; Abnormal mammogram of left breast; Osteopenia due to cancer therapy; Granulosa cell tumor of ovary, unspecified laterality    diphenhydrAMINE (BENADRYL) 25 MG tablet Take 25 mg by mouth as needed for itching or allergies. ALLERGIES    predniSONE (DELTASONE) 20 MG tablet 3 tabs po day one, then 2 po daily x 4 days Qty: 11 tablet, Refills: 0    Spacer/Aero-Holding Chambers (AEROCHAMBER PLUS WITH MASK) inhaler Use as instructed Qty: 1 each, Refills: 2      STOP taking these medications     amoxicillin (AMOXIL) 500 MG capsule        Allergies  Allergen Reactions  . Prochlorperazine Edisylate Other (See Comments)    Nervous/ flutter/ shakes--compazine  . Adhesive [Tape] Other (See Comments)    Redness/hives from adhesive tape( tolerates latex gloves); tolerates paper tape      The results of significant diagnostics from this hospitalization (including imaging, microbiology, ancillary and laboratory) are listed below for reference.    Significant Diagnostic Studies: Dg Chest 2 View  05/28/2015  CLINICAL DATA:  Worsening chest pain and shortness of breath. EXAM: CHEST  2 VIEW COMPARISON:  Earlier  today. FINDINGS: The heart remains normal in size with stable median sternotomy wires, prosthetic mitral valve and left subclavian pacer and AICD leads. Clear lungs with normal vascularity. Mild central peribronchial thickening. Mild thoracic spine degenerative changes. IMPRESSION: Mild bronchitic changes.  No acute abnormality. Electronically Signed   By: Claudie Revering M.D.   On: 05/28/2015 18:10   Dg Chest 2 View  05/28/2015  CLINICAL DATA:  Shortness of breath and productive cough for 3 days, congestion. History of CHF, asthma, mitral valve replacement, defibrillator. EXAM: CHEST  2 VIEW COMPARISON:  Chest x-ray dated 03/10/2013. FINDINGS: Heart size is upper normal, unchanged. Median sternotomy wires appear intact and stable in alignment. Mitral valve replacement hardware appears stable in position. Left chest wall pacemaker/AICD is stable in position. Lungs are clear. No evidence of CHF. No evidence of pneumonia. No pleural effusion seen. No pneumothorax seen. Again noted are degenerative changes of the left shoulder with associated calcified loose bodies and/or sequela of chronic calcific tendinopathy. No acute- appearing  osseous abnormality. IMPRESSION: Stable chest x-ray. No evidence of acute cardiopulmonary abnormality. Electronically Signed   By: Franki Cabot M.D.   On: 05/28/2015 08:02   Dg Bone Density  05/22/2015  EXAM: DUAL X-RAY ABSORPTIOMETRY (DXA) FOR BONE MINERAL DENSITY IMPRESSION: Referring Physician:  Volanda Napoleon PATIENT: Name: Charia, Rosenthal Patient ID: [id number] Birth Date: 1960/07/28 Height: 66.0 in. Sex: Female Measured: 05/22/2015 Weight: 218.1 lbs. Indications: Caucasian, Chemotherapy for Cancer, Family Hx of Osteoporosis, Hypothyroidism, Hysterectomy, Oophorectomy ( Bilateral), Post Menopausal, Tobacco User(Current), Secondary Osteoporosis, Tobacco User (Current Smoker) Fractures: Treatments: Levothyroxine, Multivitamin, Vitamin D ASSESSMENT: The BMD measured at Femur Neck is  0.831 g/cm2 with a T-score of -1.5. This patient is considered osteopenic according to [facility]) criteria. Site Region Measured Date Measured Age WHO YA BMD Classification T-score AP Spine L1-L4 05/22/2015 55.6 Osteopenia -1.1 1.051 g/cm2 DualFemur Neck 05/22/2015 55.5 years Osteopenia -1.5 0.831 g/cm2 World Health Organization [facility]) criteria for post-menopausal, Caucasian Women: Normal       T-score at or above -1 SD Osteopenia   T-score between -1 and -2.5 SD Osteoporosis T-score at or below -2.5 [place] recommends that FDA-approved medical therapies be considered in postmenopausal women and men age 67 or older with a: 1. Hip or vertebral (clinical or morphometric) fracture. 2. T-score of < -2.5 at the spine or hip. 3. Ten-year fracture probability by FRAX of 3% or greater for hip fracture or 20% or greater for major osteoporotic fracture. All treatment decisions require clinical judgment and consideration of individual patient factors, including patient preferences, co-morbidities, previous drug use, risk factors not captured in the FRAX model (e.g. falls, vitamin D deficiency, increased bone turnover, interval significant decline in bone density) and possible under - or over-estimation of fracture risk by FRAX. All patients should ensure an adequate intake of dietary calcium (1200 mg/d) and vitamin D (800 IU daily) unless contraindicated. FOLLOW-UP: People with diagnosed cases of osteoporosis or at high risk for fracture should have regular bone mineral density tests. For patients eligible for Medicare, routine testing is allowed once every 2 years. The testing frequency can be increased to one year for patients who have rapidly progressing disease, those who are receiving or discontinuing medical therapy to restore bone mass, or have additional risk factors. I have reviewed this report and agree with the above findings. [facility]  Patient: [name] Referring Physician: Volanda Napoleon Birth Date: December 29, 1960 [age]:       [age] Patient ID: [id number] Height: 66.0 in. Weight: 218.1 lbs. Measured: 05/22/2015 9:00:05 AM (16 SP 2) Sex: Female Ethnicity: White Analyzed: 05/22/2015 9:03:20 AM (16 SP 2) FRAX* 10-year Probability of Fracture Based on femoral neck BMD: Femur (Left) Major Osteoporotic Fracture: 6.3% Hip Fracture:                0.8% Population:                  [place] (Caucasian) Risk Factors: Secondary Osteoporosis, Tobacco User (Current Smoker) ASSESSMENT: The probability of a major osteoporotic fracture is 6.3% within the next ten years. The probability of a hip fracture is 0.8% within the next ten years. 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 Electronically Signed   By: David  Martinique M.D.   On: 05/22/2015 09:23   Echo Left ventricle: The cavity size was normal. Systolic function was mildly reduced. The estimated  ejection fraction was in the range of 45% to 50%. There is akinesis and aneurysmal deformity of the basal-midinferior myocardium. - Aortic valve: Trileaflet; mildly thickened, mildly calcified leaflets. - Mitral valve: A bioprosthesis was present. The findings are consistent with severe stenosis by mean gradient (33mmHg) however valve area by pressure half time is consistent with mild (1.68cm squared) . - Left atrium: The atrium was moderately dilated. - Right ventricle: Pacer wire or catheter noted in right ventricle.  Impressions:  - EF is improved when compared to prior study. Mitral valve bioprosthesis remains unchanged.  Microbiology: No results found for this or any previous visit (from the past 240 hour(s)).   Labs: Basic Metabolic Panel:  Recent Labs Lab 05/28/15 0725 05/29/15 0001 05/29/15 0505 05/30/15 0410 05/31/15 0358 06/01/15 0209  NA  140  --  137 136 136 137  K 3.9  --  3.5 4.1 3.8 3.7  CL 105  --  99* 101 96* 97*  CO2 26  --  27 26 27 28   GLUCOSE 102*  --  124* 129* 113* 122*  BUN 19  --  15 19 30* 36*  CREATININE 1.31* 1.39* 1.30* 1.17* 1.38* 1.36*  CALCIUM 9.1  --  9.5 9.5 9.6 9.5   Liver Function Tests:  Recent Labs Lab 05/28/15 0725 05/29/15 0505  AST 24 23  ALT 14 17  ALKPHOS 73 67  BILITOT 0.7 0.4  PROT 6.9 6.4*  ALBUMIN 3.9 3.5   No results for input(s): LIPASE, AMYLASE in the last 168 hours. No results for input(s): AMMONIA in the last 168 hours. CBC:  Recent Labs Lab 05/28/15 0725 05/29/15 0001 05/29/15 0505 05/30/15 0410  WBC 8.5 8.7 11.8* 15.6*  NEUTROABS 6.0  --  9.7*  --   HGB 11.8* 11.5* 11.5* 11.5*  HCT 35.9* 35.4* 33.8* 34.9*  MCV 95.0 94.4 94.4 95.1  PLT 142* 161 159 171   Cardiac Enzymes:  Recent Labs Lab 05/28/15 0725 05/28/15 1730 05/29/15 0816  TROPONINI <0.03 <0.03 0.03   BNP: BNP (last 3 results)  Recent Labs  05/28/15 0725 05/31/15 1256  BNP 261.6* 256.8*    ProBNP (last 3 results) No results for input(s): PROBNP in the last 8760 hours.  CBG: No results for input(s): GLUCAP in the last 168 hours.     SignedDelfina Redwood  [facility] 06/01/2015, 4:02 PM

## 2015-06-12 ENCOUNTER — Ambulatory Visit: Payer: 59 | Admitting: Family

## 2015-06-12 ENCOUNTER — Other Ambulatory Visit: Payer: 59

## 2015-06-13 ENCOUNTER — Encounter: Payer: Self-pay | Admitting: Internal Medicine

## 2015-06-15 ENCOUNTER — Ambulatory Visit (HOSPITAL_BASED_OUTPATIENT_CLINIC_OR_DEPARTMENT_OTHER)
Admission: RE | Admit: 2015-06-15 | Discharge: 2015-06-15 | Disposition: A | Discharge status: Home / Self Care | Payer: 59 | Source: Ambulatory Visit | Attending: Hematology & Oncology | Admitting: Hematology & Oncology

## 2015-06-15 ENCOUNTER — Ambulatory Visit (HOSPITAL_BASED_OUTPATIENT_CLINIC_OR_DEPARTMENT_OTHER): Payer: 59 | Admitting: Hematology & Oncology

## 2015-06-15 ENCOUNTER — Encounter: Payer: Self-pay | Admitting: Hematology & Oncology

## 2015-06-15 ENCOUNTER — Other Ambulatory Visit (HOSPITAL_BASED_OUTPATIENT_CLINIC_OR_DEPARTMENT_OTHER): Payer: 59

## 2015-06-15 VITALS — BP 98/61 | HR 87 | Temp 97.8°F | Resp 18 | Ht 66.0 in | Wt 216.0 lb

## 2015-06-15 DIAGNOSIS — D3910 Neoplasm of uncertain behavior of unspecified ovary: Secondary | ICD-10-CM

## 2015-06-15 DIAGNOSIS — I700 Atherosclerosis of aorta: Secondary | ICD-10-CM | POA: Diagnosis not present

## 2015-06-15 DIAGNOSIS — Z90710 Acquired absence of both cervix and uterus: Secondary | ICD-10-CM | POA: Insufficient documentation

## 2015-06-15 DIAGNOSIS — R928 Other abnormal and inconclusive findings on diagnostic imaging of breast: Secondary | ICD-10-CM

## 2015-06-15 DIAGNOSIS — Z952 Presence of prosthetic heart valve: Secondary | ICD-10-CM | POA: Insufficient documentation

## 2015-06-15 DIAGNOSIS — M8580 Other specified disorders of bone density and structure, unspecified site: Secondary | ICD-10-CM | POA: Diagnosis not present

## 2015-06-15 DIAGNOSIS — Z96641 Presence of right artificial hip joint: Secondary | ICD-10-CM | POA: Diagnosis not present

## 2015-06-15 DIAGNOSIS — G4701 Insomnia due to medical condition: Secondary | ICD-10-CM | POA: Insufficient documentation

## 2015-06-15 DIAGNOSIS — R918 Other nonspecific abnormal finding of lung field: Secondary | ICD-10-CM | POA: Insufficient documentation

## 2015-06-15 DIAGNOSIS — Z90722 Acquired absence of ovaries, bilateral: Secondary | ICD-10-CM | POA: Diagnosis not present

## 2015-06-15 DIAGNOSIS — C569 Malignant neoplasm of unspecified ovary: Secondary | ICD-10-CM | POA: Insufficient documentation

## 2015-06-15 DIAGNOSIS — D3912 Neoplasm of uncertain behavior of left ovary: Secondary | ICD-10-CM | POA: Diagnosis not present

## 2015-06-15 DIAGNOSIS — R110 Nausea: Secondary | ICD-10-CM

## 2015-06-15 DIAGNOSIS — K7689 Other specified diseases of liver: Secondary | ICD-10-CM | POA: Diagnosis not present

## 2015-06-15 LAB — CBC WITH DIFFERENTIAL (CANCER CENTER ONLY)
BASO#: 0 10*3/uL (ref 0.0–0.2)
BASO%: 0.3 % (ref 0.0–2.0)
EOS ABS: 0.1 10*3/uL (ref 0.0–0.5)
EOS%: 0.9 % (ref 0.0–7.0)
HEMATOCRIT: 36.4 % (ref 34.8–46.6)
HGB: 11.9 g/dL (ref 11.6–15.9)
LYMPH#: 2 10*3/uL (ref 0.9–3.3)
LYMPH%: 17.9 % (ref 14.0–48.0)
MCH: 30.9 pg (ref 26.0–34.0)
MCHC: 32.7 g/dL (ref 32.0–36.0)
MCV: 95 fL (ref 81–101)
MONO#: 0.8 10*3/uL (ref 0.1–0.9)
MONO%: 6.9 % (ref 0.0–13.0)
NEUT#: 8.3 10*3/uL — ABNORMAL HIGH (ref 1.5–6.5)
NEUT%: 74 % (ref 39.6–80.0)
Platelets: 165 10*3/uL (ref 145–400)
RBC: 3.85 10*6/uL (ref 3.70–5.32)
RDW: 13.2 % (ref 11.1–15.7)
WBC: 11.2 10*3/uL — ABNORMAL HIGH (ref 3.9–10.0)

## 2015-06-15 LAB — CMP (CANCER CENTER ONLY)
ALBUMIN: 3 g/dL — AB (ref 3.3–5.5)
ALK PHOS: 62 U/L (ref 26–84)
ALT: 35 U/L (ref 10–47)
AST: 29 U/L (ref 11–38)
BILIRUBIN TOTAL: 0.5 mg/dL (ref 0.20–1.60)
BUN, Bld: 14 mg/dL (ref 7–22)
CALCIUM: 8.7 mg/dL (ref 8.0–10.3)
CO2: 24 mEq/L (ref 18–33)
CREATININE: 1.4 mg/dL — AB (ref 0.6–1.2)
Chloride: 101 mEq/L (ref 98–108)
GLUCOSE: 127 mg/dL — AB (ref 73–118)
Potassium: 3.7 mEq/L (ref 3.3–4.7)
SODIUM: 136 meq/L (ref 128–145)
Total Protein: 7 g/dL (ref 6.4–8.1)

## 2015-06-15 LAB — LACTATE DEHYDROGENASE: LDH: 197 U/L (ref 125–245)

## 2015-06-15 MED ORDER — IOHEXOL 300 MG/ML  SOLN
80.0000 mL | Freq: Once | INTRAMUSCULAR | Status: AC | PRN
  Administered 2015-06-15: 80 mL via INTRAVENOUS

## 2015-06-15 NOTE — Progress Notes (Signed)
Hematology and Oncology Follow Up Visit  [name] [id number] 12/08/1960 54 y.o. 06/15/2015   Principle Diagnosis:   Recurrent granulosa cell tumor of the ovary  Current Therapy:    Femara 2.5 mg by mouth daily     Interim History:  [name] is back for followup. She looks good. She feels pretty good. She actually was in the hospital recently with a severe case of bronchitis. She had a very or a workup. There is no evidence of pneumonia. She had no thromboembolic disease.  We did go ahead and get scans on her today. The scans since he showed some response. She does have a increase in a left pelvic sidewall lesion measuring 2.4 x 1.8 cm. Everything else has significantly decreased in size. She has stability of 2 subcentimeter pulmonary nodules.  She's had no pelvic pain. She's had no change in bowel or bladder habits. She's had no rashes. She's had no leg swelling.  She is still working.  She is looking for to Christmas. Her family will be coming in.   Overall, her performance status is ECOG 1.   Medications:  Current outpatient prescriptions:  .  albuterol (PROVENTIL HFA;VENTOLIN HFA) 108 (90 BASE) MCG/ACT inhaler, Inhale 2 puffs into the lungs every 4 (four) hours as needed for wheezing or shortness of breath., Disp: 1 Inhaler, Rfl: 1 .  amoxicillin (AMOXIL) 500 MG capsule, TAKE 2 CAPSULES BY MOUTH TWICE DAILY., Disp: , Rfl: 0 .  aspirin 325 MG EC tablet, Take 325 mg by mouth every morning. , Disp: , Rfl:  .  atorvastatin (LIPITOR) 40 MG tablet, Take 1 tablet (40 mg total) by mouth every evening., Disp: 30 tablet, Rfl: 4 .  b complex vitamins tablet, Take 1 tablet by mouth daily., Disp: , Rfl:  .  carvedilol (COREG) 12.5 MG tablet, TAKE 1 TABLET (12.5 MG TOTAL) BY MOUTH 2 (TWO) TIMES DAILY WITH A MEAL., Disp: 60 tablet, Rfl: 11 .  cholecalciferol (VITAMIN D-400) 400 UNITS TABS tablet, Take 400 Units by mouth 2 (two) times daily., Disp: , Rfl:  .  diphenhydrAMINE  (BENADRYL) 25 MG tablet, Take 25 mg by mouth as needed for itching or allergies. ALLERGIES, Disp: , Rfl:  .  fish oil-omega-3 fatty acids 1000 MG capsule, Take 1 g by mouth 2 (two) times daily., Disp: , Rfl:  .  furosemide (LASIX) 20 MG tablet, Take 1 tablet (20 mg total) by mouth daily., Disp: 90 tablet, Rfl: 3 .  letrozole (FEMARA) 2.5 MG tablet, Take 1 tablet (2.5 mg total) by mouth daily., Disp: 90 tablet, Rfl: 3 .  levothyroxine (SYNTHROID, LEVOTHROID) 75 MCG tablet, TAKE 1 TABLET (75 MCG TOTAL) BY MOUTH DAILY BEFORE BREAKFAST., Disp: 30 tablet, Rfl: 3 .  LORazepam (ATIVAN) 0.5 MG tablet, Take 1 tablet (0.5 mg total) by mouth every 6 (six) hours as needed (Nausea or vomiting)., Disp: 60 tablet, Rfl: 2 .  Multiple Vitamins-Minerals (MULTIVITAMIN PO), Take 1 tablet by mouth daily. , Disp: , Rfl:  .  predniSONE (DELTASONE) 20 MG tablet, 3 tabs po day one, then 2 po daily x 4 days, Disp: 11 tablet, Rfl: 0 .  Spacer/Aero-Holding Chambers (AEROCHAMBER PLUS WITH MASK) inhaler, Use as instructed, Disp: 1 each, Rfl: 2 .  spironolactone (ALDACTONE) 25 MG tablet, TAKE 1 TABLET (25 MG TOTAL) BY MOUTH 2 (TWO) TIMES DAILY., Disp: 60 tablet, Rfl: 11 .  zolpidem (AMBIEN) 5 MG tablet, Take 1 tablet (5 mg total) by mouth at bedtime., Disp: 30  tablet, Rfl: 2  Allergies:  Allergies  Allergen Reactions  . Prochlorperazine Edisylate Other (See Comments)    Nervous/ flutter/ shakes--compazine  . Adhesive [Tape] Other (See Comments)    Redness/hives from adhesive tape( tolerates latex gloves); tolerates paper tape    Past Medical History, Surgical history, Social history, and Family History were reviewed and updated.  Review of Systems: As above  Physical Exam:  height is 5\' 6"  (1.676 m) and weight is 216 lb (97.977 kg). Her oral temperature is 97.8 F (36.6 C). Her blood pressure is 98/61 and her pulse is 87. Her respiration is 18.   Well-developed well-nourished white female. Lungs are clear. Head and  neck exam shows no ocular or oral lesion. There is no adenopathy. Lungs are clear. Cardiac exam regular rate and rhythm. She is a 1/6 systolic murmur. Abdomen is soft with well-healed laparotomy scars. She has no fluid wave. No palpable liver or spleen. There is no obvious abdominal mass. Back exam shows no tenderness over the spine ribs or hips. Extremities shows no clubbing cyanosis or edema. She has good range of motion of her joints. She has good strength in her muscle groups. Skin exam no rashes, ecchymoses or petechia. Neurological exam shows no focal neurological deficits.  Lab Results  Component Value Date   WBC 11.2* 06/15/2015   HGB 11.9 06/15/2015   HCT 36.4 06/15/2015   MCV 95 06/15/2015   PLT 165 small platelet clumps present 06/15/2015     Chemistry      Component Value Date/Time   NA 137 06/01/2015 0209   NA 138 04/26/2015 0943   NA 142 03/08/2014 0950   K 3.7 06/01/2015 0209   K 3.9 04/26/2015 0943   K 3.9 03/08/2014 0950   CL 97* 06/01/2015 0209   CL 105 03/08/2014 0950   CO2 28 06/01/2015 0209   CO2 22 04/26/2015 0943   CO2 24 03/08/2014 0950   BUN 36* 06/01/2015 0209   BUN 14.6 04/26/2015 0943   BUN 19 03/08/2014 0950   CREATININE 1.36* 06/01/2015 0209   CREATININE 1.3* 04/26/2015 0943   CREATININE 1.42* 04/11/2015 1507      Component Value Date/Time   CALCIUM 9.5 06/01/2015 0209   CALCIUM 10.0 04/26/2015 0943   CALCIUM 10.2 03/08/2014 0950   ALKPHOS 67 05/29/2015 0505   ALKPHOS 80 04/26/2015 0943   ALKPHOS 59 03/08/2014 0950   AST 23 05/29/2015 0505   AST 17 04/26/2015 0943   AST 22 03/08/2014 0950   ALT 17 05/29/2015 0505   ALT 17 04/26/2015 0943   ALT 23 03/08/2014 0950   BILITOT 0.4 05/29/2015 0505   BILITOT <0.30 04/26/2015 0943   BILITOT 0.50 03/08/2014 0950         Impression and Plan: [name] is 54 year old white female with a recurrent granulosa cell tumor. She underwent debulking.She did have adjuvant chemotherapy. She subsequently  had another recurrence. She had this resected. This was resected back on February of 2015.  I will continue on the Femara. I did this is still very reasonable. She is asymptomatic. She is tolerating Femara well. She is responding overall.   We probably do not have to do another scan on her until February or March.   I will plan to get her back in 6 weeks.   I spent about 30 minutes with her today.    Volanda Napoleon, MD 12/22/20169:33 AM

## 2015-06-21 LAB — INHIBIN B: Inhibin B: 65 pg/mL

## 2015-07-07 ENCOUNTER — Encounter: Payer: Self-pay | Admitting: Hematology & Oncology

## 2015-08-03 ENCOUNTER — Other Ambulatory Visit: Payer: Self-pay | Admitting: Hematology & Oncology

## 2015-08-07 ENCOUNTER — Other Ambulatory Visit (HOSPITAL_BASED_OUTPATIENT_CLINIC_OR_DEPARTMENT_OTHER): Payer: 59

## 2015-08-07 ENCOUNTER — Ambulatory Visit (HOSPITAL_BASED_OUTPATIENT_CLINIC_OR_DEPARTMENT_OTHER): Payer: 59 | Admitting: Hematology & Oncology

## 2015-08-07 ENCOUNTER — Encounter: Payer: Self-pay | Admitting: Hematology & Oncology

## 2015-08-07 VITALS — BP 97/63 | HR 84 | Temp 97.3°F | Resp 16 | Ht 66.0 in | Wt 217.0 lb

## 2015-08-07 DIAGNOSIS — D3910 Neoplasm of uncertain behavior of unspecified ovary: Secondary | ICD-10-CM

## 2015-08-07 DIAGNOSIS — D3912 Neoplasm of uncertain behavior of left ovary: Secondary | ICD-10-CM

## 2015-08-07 DIAGNOSIS — M2550 Pain in unspecified joint: Secondary | ICD-10-CM

## 2015-08-07 LAB — CBC WITH DIFFERENTIAL (CANCER CENTER ONLY)
BASO#: 0 10*3/uL (ref 0.0–0.2)
BASO%: 0.2 % (ref 0.0–2.0)
EOS ABS: 0.1 10*3/uL (ref 0.0–0.5)
EOS%: 1.1 % (ref 0.0–7.0)
HCT: 37.9 % (ref 34.8–46.6)
HEMOGLOBIN: 12.7 g/dL (ref 11.6–15.9)
LYMPH#: 2 10*3/uL (ref 0.9–3.3)
LYMPH%: 20.5 % (ref 14.0–48.0)
MCH: 30.9 pg (ref 26.0–34.0)
MCHC: 33.5 g/dL (ref 32.0–36.0)
MCV: 92 fL (ref 81–101)
MONO#: 0.8 10*3/uL (ref 0.1–0.9)
MONO%: 8 % (ref 0.0–13.0)
NEUT%: 70.2 % (ref 39.6–80.0)
NEUTROS ABS: 6.8 10*3/uL — AB (ref 1.5–6.5)
Platelets: 174 10*3/uL (ref 145–400)
RBC: 4.11 10*6/uL (ref 3.70–5.32)
RDW: 14 % (ref 11.1–15.7)
WBC: 9.8 10*3/uL (ref 3.9–10.0)

## 2015-08-07 LAB — COMPREHENSIVE METABOLIC PANEL
ALBUMIN: 3.8 g/dL (ref 3.5–5.0)
ALK PHOS: 79 U/L (ref 40–150)
ALT: 13 U/L (ref 0–55)
AST: 16 U/L (ref 5–34)
Anion Gap: 13 mEq/L — ABNORMAL HIGH (ref 3–11)
BUN: 19.1 mg/dL (ref 7.0–26.0)
CO2: 20 mEq/L — ABNORMAL LOW (ref 22–29)
Calcium: 9.6 mg/dL (ref 8.4–10.4)
Chloride: 106 mEq/L (ref 98–109)
Creatinine: 1.4 mg/dL — ABNORMAL HIGH (ref 0.6–1.1)
EGFR: 41 mL/min/{1.73_m2} — ABNORMAL LOW (ref >90)
GLUCOSE: 120 mg/dL (ref 70–140)
POTASSIUM: 3.8 meq/L (ref 3.5–5.1)
SODIUM: 138 meq/L (ref 136–145)
Total Bilirubin: 0.35 mg/dL (ref 0.20–1.20)
Total Protein: 7.2 g/dL (ref 6.4–8.3)

## 2015-08-07 MED ORDER — VITAMIN D (ERGOCALCIFEROL) 1.25 MG (50000 UNIT) PO CAPS
50000.0000 [IU] | ORAL_CAPSULE | ORAL | Status: DC
Start: 2015-08-07 — End: 2016-08-12

## 2015-08-07 NOTE — Addendum Note (Signed)
Addended by: Volanda Napoleon on: 08/07/2015 03:32 PM   Modules accepted: Orders

## 2015-08-07 NOTE — Progress Notes (Addendum)
Hematology and Oncology Follow Up Visit  ARINN MIHALOVICH [id number] Dec 11, 1960 55 y.o. 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  Durman is back for followup. She looks good. Showed a regular Christians holiday. She was with her family.  She is still working. She is quite busy at work.  Her inhibin B level has been going up slowly. Back in December he was up to 33.  Her last set of scans were done back in December. She seemed to have stable disease although there may been an area that looked to be larger.  She's had no change in bowel or bladder habits. She's had no cough. She had no nausea or vomiting. She's had no leg swelling. She's had no rashes.  She is complaining more of arthralgias. I suspect that this probably is from the Femara area and she is on vitamin D 1000 units twice day. I think she has to go up to the 50,000 unit weekly dose.  Overall, her performance status is ECOG 1.   Medications:  Current outpatient prescriptions:  .  albuterol (PROVENTIL HFA;VENTOLIN HFA) 108 (90 BASE) MCG/ACT inhaler, Inhale 2 puffs into the lungs every 4 (four) hours as needed for wheezing or shortness of breath., Disp: 1 Inhaler, Rfl: 1 .  amoxicillin (AMOXIL) 500 MG capsule, TAKE 2 CAPSULES BY MOUTH TWICE DAILY., Disp: , Rfl: 0 .  aspirin 325 MG EC tablet, Take 325 mg by mouth every morning. , Disp: , Rfl:  .  atorvastatin (LIPITOR) 40 MG tablet, Take 1 tablet (40 mg total) by mouth every evening., Disp: 30 tablet, Rfl: 4 .  b complex vitamins tablet, Take 1 tablet by mouth daily., Disp: , Rfl:  .  carvedilol (COREG) 12.5 MG tablet, TAKE 1 TABLET (12.5 MG TOTAL) BY MOUTH 2 (TWO) TIMES DAILY WITH A MEAL., Disp: 60 tablet, Rfl: 11 .  cholecalciferol (VITAMIN D-400) 400 UNITS TABS tablet, Take 400 Units by mouth 2 (two) times daily., Disp: , Rfl:  .  diphenhydrAMINE (BENADRYL)  25 MG tablet, Take 25 mg by mouth as needed for itching or allergies. ALLERGIES, Disp: , Rfl:  .  fish oil-omega-3 fatty acids 1000 MG capsule, Take 1 g by mouth 2 (two) times daily., Disp: , Rfl:  .  furosemide (LASIX) 20 MG tablet, Take 1 tablet (20 mg total) by mouth daily., Disp: 90 tablet, Rfl: 3 .  letrozole (FEMARA) 2.5 MG tablet, Take 1 tablet (2.5 mg total) by mouth daily., Disp: 90 tablet, Rfl: 3 .  levothyroxine (SYNTHROID, LEVOTHROID) 75 MCG tablet, TAKE 1 TABLET (75 MCG TOTAL) BY MOUTH DAILY BEFORE BREAKFAST., Disp: 30 tablet, Rfl: 3 .  LORazepam (ATIVAN) 0.5 MG tablet, Take 1 tablet (0.5 mg total) by mouth every 6 (six) hours as needed (Nausea or vomiting)., Disp: 60 tablet, Rfl: 2 .  Multiple Vitamins-Minerals (MULTIVITAMIN PO), Take 1 tablet by mouth daily. , Disp: , Rfl:  .  predniSONE (DELTASONE) 20 MG tablet, 3 tabs po day one, then 2 po daily x 4 days, Disp: 11 tablet, Rfl: 0 .  Spacer/Aero-Holding Chambers (AEROCHAMBER PLUS WITH MASK) inhaler, Use as instructed, Disp: 1 each, Rfl: 2 .  spironolactone (ALDACTONE) 25 MG tablet, TAKE 1 TABLET (25 MG TOTAL) BY MOUTH 2 (TWO) TIMES DAILY., Disp: 60 tablet, Rfl: 11 .  zolpidem (AMBIEN) 5 MG tablet, TAKE 1 TABLET  BY MOUTH AT BEDTIME, Disp: 30 tablet, Rfl: 2  Allergies:  Allergies  Allergen Reactions  . Prochlorperazine Edisylate Other (See Comments)    Nervous/ flutter/ shakes--compazine  . Adhesive [Tape] Other (See Comments)    Redness/hives from adhesive tape( tolerates latex gloves); tolerates paper tape    Past Medical History, Surgical history, Social history, and Family History were reviewed and updated.  Review of Systems: As above  Physical Exam:  height is 5\' 6"  (1.676 m) and weight is 217 lb (98.431 kg). Her oral temperature is 97.3 F (36.3 C). Her blood pressure is 97/63 and her pulse is 84. Her respiration is 16.   Well-developed well-nourished white female. Lungs are clear. Head and neck exam shows no ocular  or oral lesion. There is no adenopathy. Lungs are clear. Cardiac exam regular rate and rhythm. She has a 1/6 systolic murmur. Abdomen is soft with well-healed laparotomy scars. She has no fluid wave. No palpable liver or spleen. There is no obvious abdominal mass. Back exam shows no tenderness over the spine ribs or hips. Extremities shows no clubbing cyanosis or edema. She has good range of motion of her joints. She has good strength in her muscle groups. Skin exam no rashes, ecchymoses or petechia. Neurological exam shows no focal neurological deficits.  Lab Results  Component Value Date   WBC 9.8 08/07/2015   HGB 12.7 08/07/2015   HCT 37.9 08/07/2015   MCV 92 08/07/2015   PLT 174 08/07/2015     Chemistry      Component Value Date/Time   NA 138 08/07/2015 0937   NA 136 06/15/2015 0850   NA 137 06/01/2015 0209   K 3.8 08/07/2015 0937   K 3.7 06/15/2015 0850   K 3.7 06/01/2015 0209   CL 101 06/15/2015 0850   CL 97* 06/01/2015 0209   CO2 20* 08/07/2015 0937   CO2 24 06/15/2015 0850   CO2 28 06/01/2015 0209   BUN 19.1 08/07/2015 0937   BUN 14 06/15/2015 0850   BUN 36* 06/01/2015 0209   CREATININE 1.4* 08/07/2015 0937   CREATININE 1.4* 06/15/2015 0850   CREATININE 1.36* 06/01/2015 0209      Component Value Date/Time   CALCIUM 9.6 08/07/2015 0937   CALCIUM 8.7 06/15/2015 0850   CALCIUM 9.5 06/01/2015 0209   ALKPHOS 79 08/07/2015 0937   ALKPHOS 62 06/15/2015 0850   ALKPHOS 67 05/29/2015 0505   AST 16 08/07/2015 0937   AST 29 06/15/2015 0850   AST 23 05/29/2015 0505   ALT 13 08/07/2015 0937   ALT 35 06/15/2015 0850   ALT 17 05/29/2015 0505   BILITOT 0.35 08/07/2015 0937   BILITOT 0.50 06/15/2015 0850   BILITOT 0.4 05/29/2015 0505         Impression and Plan: Ms. Mechling is 55 year old white female with a recurrent granulosa cell tumor. She underwent debulking.She did have adjuvant chemotherapy. She subsequently had another recurrence. She had this resected. This was  resected back on February of 2015.  I will continue on the Femara. I think that this is still very reasonable. She is asymptomatic. She is tolerating Femara well. She is responding overall.   I will plan for a another CT scan in March. I think we'll have to do a scan of the abdomen and pelvis.  With the arthralgias that she is having from the Femara, I think we have to get on vitamin D 50,000 units weekly. I will send this to her pharmacy.  I will  plan to get her back in 4 weeks.   I spent about 30 minutes with her today.    Volanda Napoleon, MD 2/13/20173:04 PM

## 2015-08-08 LAB — INHIBIN B: Inhibin B: 112.7 pg/mL — ABNORMAL HIGH (ref 0.0–16.9)

## 2015-08-16 ENCOUNTER — Encounter: Payer: Self-pay | Admitting: Hematology & Oncology

## 2015-08-31 ENCOUNTER — Encounter: Payer: Self-pay | Admitting: Internal Medicine

## 2015-08-31 ENCOUNTER — Ambulatory Visit (INDEPENDENT_AMBULATORY_CARE_PROVIDER_SITE_OTHER): Payer: 59 | Admitting: Internal Medicine

## 2015-08-31 VITALS — BP 108/78 | HR 66 | Ht 66.0 in | Wt 219.2 lb

## 2015-08-31 DIAGNOSIS — I5022 Chronic systolic (congestive) heart failure: Secondary | ICD-10-CM | POA: Diagnosis not present

## 2015-08-31 NOTE — Progress Notes (Signed)
HPI [name] returns today for followup. She is a very pleasant 55 year old woman with a nonischemic cardiomyopathy, chronic systolic heart failure, status post ICD implantation. She has class II heart failure symptoms. In the interim, she has noted worsening sob. She stopped amiodarone last year. No edema. No ICD therapies. She has a recall St. Jude device which has been working normally.  Allergies  Allergen Reactions  . Prochlorperazine Edisylate Other (See Comments)    Nervous/ flutter/ shakes--compazine  . Adhesive [Tape] Other (See Comments)    Redness/hives from adhesive tape( tolerates latex gloves); tolerates paper tape  . Prochlorperazine Other (See Comments)    Jitters, hyper     Current Outpatient Prescriptions  Medication Sig Dispense Refill  . albuterol (PROVENTIL HFA;VENTOLIN HFA) 108 (90 BASE) MCG/ACT inhaler Inhale 2 puffs into the lungs every 4 (four) hours as needed for wheezing or shortness of breath. 1 Inhaler 1  . aspirin 325 MG EC tablet Take 325 mg by mouth every morning.     Marland Kitchen atorvastatin (LIPITOR) 40 MG tablet Take 1 tablet (40 mg total) by mouth every evening. 30 tablet 4  . b complex vitamins tablet Take 1 tablet by mouth daily.    . carvedilol (COREG) 12.5 MG tablet TAKE 1 TABLET (12.5 MG TOTAL) BY MOUTH 2 (TWO) TIMES DAILY WITH A MEAL. 60 tablet 11  . cholecalciferol (VITAMIN D-400) 400 UNITS TABS tablet Take 400 Units by mouth 2 (two) times daily.    . diphenhydrAMINE (BENADRYL) 25 MG tablet Take 25 mg by mouth as needed for itching or allergies. ALLERGIES    . fish oil-omega-3 fatty acids 1000 MG capsule Take 1 g by mouth 2 (two) times daily.    . furosemide (LASIX) 20 MG tablet Take 1 tablet (20 mg total) by mouth daily. 90 tablet 3  . letrozole (FEMARA) 2.5 MG tablet Take 1 tablet (2.5 mg total) by mouth daily. 90 tablet 3  . levothyroxine (SYNTHROID, LEVOTHROID) 75 MCG tablet TAKE 1 TABLET (75 MCG TOTAL) BY MOUTH DAILY BEFORE BREAKFAST. 30 tablet 3  .  LORazepam (ATIVAN) 0.5 MG tablet Take 1 tablet (0.5 mg total) by mouth every 6 (six) hours as needed (Nausea or vomiting). 60 tablet 2  . Multiple Vitamins-Minerals (MULTIVITAMIN PO) Take 1 tablet by mouth daily.     Marland Kitchen spironolactone (ALDACTONE) 25 MG tablet TAKE 1 TABLET (25 MG TOTAL) BY MOUTH 2 (TWO) TIMES DAILY. 60 tablet 11  . Vitamin D, Ergocalciferol, (DRISDOL) 50000 units CAPS capsule Take 1 capsule (50,000 Units total) by mouth every 7 (seven) days. 24 capsule 3  . zolpidem (AMBIEN) 5 MG tablet TAKE 1 TABLET BY MOUTH AT BEDTIME 30 tablet 2   No current facility-administered medications for this visit.     Past Medical History  Diagnosis Date  . Arrhythmia     ventricular tachycardia  . Unspecified systolic heart failure   . Ventricular fibrillation ([place])   . ICD (implantable cardiac defibrillator) in place   . Pacemaker   . High cholesterol   . CHF (congestive heart failure) ([place])   . Myocardial infarction ([place]) 2011  . Hypothyroidism   . Arthritis 02/06/2012    "everywhere"  . Bladder cancer [facility]) 2009    "injected medicine to get rid of it"  . Granulosa cell carcinoma of ovary ([place]) 2007    right; "had chemo"  . Pneumonia     as child  . Neuropathy ([facility])     FEET AND HANDS - FROM CHEMO -  BUT MUCH IMPROVED  . Port-a-cath in place     RIGHT UPPER CHEST  . Pain     JOINT PAINS AND MUSCLE ACHES ALL OVER.  Marland Kitchen Automatic implantable cardioverter-defibrillator in situ     DR. Beckie Salts   . Coronary artery disease     DR. Linard Millers  . Asthma     as a child    ROS:   All systems reviewed and negative except as noted in the HPI.   Past Surgical History  Procedure Laterality Date  . Cardiac defibrillator placement  02/06/2012    "lead change"  . Insert / replace / remove pacemaker  10/2009    [place]  . Tonsillectomy and adenoidectomy  ~ 1967  . Appendectomy  1989  . Vaginal hysterectomy  2001  . Tubal ligation  1993  . Bilateral  oophorectomy  2007  . Mitral valve replacement  2011    "pig valve"  . Cardiac valve replacement    . Transurethral resection of bladder  2009    "for bladder cancer"  . Thymus removed    . Total hip arthroplasty  05/06/2012    Procedure: TOTAL HIP ARTHROPLASTY;  Surgeon: Gearlean Alf, MD;  Location: [facility];  Service: Orthopedics;  Laterality: Right;  . Laparotomy N/A 01/19/2013    Procedure: TUMOR DEBULKING / BOWEL RESECTION/INSERTION RIGHT UTERERAL DOUBLE J STENT/OMENTECTOMY;  Surgeon: Alvino Chapel, MD;  Location: [facility];  Service: Gynecology;  Laterality: N/A;  . Cystoscopy Right 02/23/2013    Procedure: CYSTOSCOPY WITH STENT REMOVAL;  Surgeon: Alvino Chapel, MD;  Location: [facility];  Service: Gynecology;  Laterality: Right;  . Abdominal hysterectomy N/A 07/27/2013    Procedure: TUMOR EXCISION OF ABDOMINAL MASS;  Surgeon: Alvino Chapel, MD;  Location: [facility];  Service: Gynecology;  Laterality: N/A;  . Implantable cardioverter defibrillator revision N/A 02/06/2012    Procedure: IMPLANTABLE CARDIOVERTER DEFIBRILLATOR REVISION;  Surgeon: Evans Lance, MD;  Location: [facility] CATH LAB;  Service: Cardiovascular;  Laterality: N/A;     Family History  Problem Relation Age of Onset  . Heart attack Maternal Grandfather   . Heart attack Paternal Grandfather   . Stroke Maternal Grandmother   . Heart attack Brother      Social History   Social History  . Marital Status: Divorced    Spouse Name: N/A  . Number of Children: N/A  . Years of Education: N/A   Occupational History  .      WORKS FULL TIME   Social History Main Topics  . Smoking status: Current Some Day Smoker -- 0.50 packs/day for 30 years    Types: Cigarettes    Start date: 03/28/1974  . Smokeless tobacco: Never Used     Comment:  10-27-13  still smoking daily  . Alcohol Use: No  . Drug Use: No  . Sexual Activity: Not Currently   Other Topics Concern  . Not on file   Social History Narrative    WORKS FULL TIME   SINGLE   TOBACCO USE-YES   IMPLANTATION OF DUAL-CHAMBER St. JUDE DEFIBRILLATOR     BP 108/78 mmHg  Pulse 66  Ht 5\' 6"  (1.676 m)  Wt 219 lb 3.2 oz (99.428 kg)  BMI 35.40 kg/m2  Physical Exam:  Well appearing middle-age woman,NAD HEENT: Unremarkable Neck:  No JVD, no thyromegally Back:  No CVA tenderness Lungs:  Clear with no wheezes, rales, or rhonchi. Right chest has a healing Port-A-Cath HEART:  Regular rate rhythm, no murmurs, no rubs, no clicks Abd:  soft, positive bowel sounds, no organomegally, no rebound, no guarding Ext:  2 plus pulses, no edema, no cyanosis, no clubbing Skin:  No rashes no nodules Neuro:  CN II through XII intact, motor grossly intact  DEVICE  Normal device function.  See PaceArt for details.  Over 4 years on battery  Assess/Plan: 1. Chronic systolic heart failure - her symptoms are class 2. Will continue her current meds 2. ICD - her St. Jude recall device is working normally. Will recheck in several months.  Mikle Bosworth.D.

## 2015-08-31 NOTE — Patient Instructions (Signed)
Medication Instructions:  Your physician recommends that you continue on your current medications as directed. Please refer to the Current Medication list given to you today.   Labwork: None ordered   Testing/Procedures: None ordered   Follow-Up: Your physician wants you to follow-up in: 12 months with Dr Knox Saliva will receive a reminder letter in the mail two months in advance. If you don't receive a letter, please call our office to schedule the follow-up appointment.  Remote monitoring is used to monitor your Pacemaker from home. This monitoring reduces the number of office visits required to check your device to one time per year. It allows [place] to keep an eye on the functioning of your device to ensure it is working properly. You are scheduled for a device check from home on 11/30/15. You may send your transmission at any time that day. If you have a wireless device, the transmission will be sent automatically. After your physician reviews your transmission, you will receive a postcard with your next transmission date.      Any Other Special Instructions Will Be Listed Below (If Applicable).     If you need a refill on your cardiac medications before your next appointment, please call your pharmacy.

## 2015-09-01 ENCOUNTER — Encounter (HOSPITAL_BASED_OUTPATIENT_CLINIC_OR_DEPARTMENT_OTHER): Payer: Self-pay

## 2015-09-01 ENCOUNTER — Ambulatory Visit (HOSPITAL_BASED_OUTPATIENT_CLINIC_OR_DEPARTMENT_OTHER)
Admission: RE | Admit: 2015-09-01 | Discharge: 2015-09-01 | Disposition: A | Discharge status: Home / Self Care | Payer: 59 | Source: Ambulatory Visit | Attending: Hematology & Oncology | Admitting: Hematology & Oncology

## 2015-09-01 DIAGNOSIS — D3910 Neoplasm of uncertain behavior of unspecified ovary: Secondary | ICD-10-CM | POA: Diagnosis present

## 2015-09-01 LAB — CUP PACEART INCLINIC DEVICE CHECK
Battery Remaining Longevity: 49.2
Brady Statistic RA Percent Paced: 44 %
Date Time Interrogation Session: 20170307211817
HIGH POWER IMPEDANCE MEASURED VALUE: 65 Ohm
Implantable Lead Implant Date: 20110503
Implantable Lead Location: 753859
Implantable Lead Location: 753860
Lead Channel Impedance Value: 362.5 Ohm
Lead Channel Impedance Value: 412.5 Ohm
Lead Channel Pacing Threshold Pulse Width: 0.5 ms
Lead Channel Sensing Intrinsic Amplitude: 3 mV
Lead Channel Setting Pacing Amplitude: 1.75 V
Lead Channel Setting Pacing Pulse Width: 0.5 ms
MDC IDC LEAD IMPLANT DT: 20130815
MDC IDC MSMT LEADCHNL RA IMPEDANCE VALUE: 412.5 Ohm
MDC IDC MSMT LEADCHNL RA PACING THRESHOLD AMPLITUDE: 0.5 V
MDC IDC MSMT LEADCHNL RV IMPEDANCE VALUE: 362.5 Ohm
MDC IDC MSMT LEADCHNL RV PACING THRESHOLD AMPLITUDE: 1 V
MDC IDC MSMT LEADCHNL RV PACING THRESHOLD PULSEWIDTH: 0.7 ms
MDC IDC MSMT LEADCHNL RV SENSING INTR AMPL: 11.4 mV
MDC IDC SET LEADCHNL RV PACING AMPLITUDE: 1.375
MDC IDC SET LEADCHNL RV SENSING SENSITIVITY: 0.5 mV
MDC IDC STAT BRADY RV PERCENT PACED: 77 %
Pulse Gen Serial Number: 644997

## 2015-09-01 MED ORDER — IOHEXOL 300 MG/ML  SOLN
100.0000 mL | Freq: Once | INTRAMUSCULAR | Status: AC | PRN
  Administered 2015-09-01: 60 mL via INTRAVENOUS

## 2015-09-08 ENCOUNTER — Encounter: Payer: Self-pay | Admitting: Hematology & Oncology

## 2015-09-08 ENCOUNTER — Other Ambulatory Visit (HOSPITAL_BASED_OUTPATIENT_CLINIC_OR_DEPARTMENT_OTHER): Payer: 59

## 2015-09-08 ENCOUNTER — Ambulatory Visit (HOSPITAL_BASED_OUTPATIENT_CLINIC_OR_DEPARTMENT_OTHER): Payer: 59 | Admitting: Hematology & Oncology

## 2015-09-08 VITALS — BP 120/70 | HR 88 | Temp 97.6°F | Resp 18 | Ht 66.0 in | Wt 219.0 lb

## 2015-09-08 DIAGNOSIS — D3912 Neoplasm of uncertain behavior of left ovary: Secondary | ICD-10-CM

## 2015-09-08 DIAGNOSIS — D3910 Neoplasm of uncertain behavior of unspecified ovary: Secondary | ICD-10-CM

## 2015-09-08 LAB — COMPREHENSIVE METABOLIC PANEL
ALT: 14 U/L (ref 0–55)
AST: 15 U/L (ref 5–34)
Albumin: 3.8 g/dL (ref 3.5–5.0)
Alkaline Phosphatase: 80 U/L (ref 40–150)
Anion Gap: 10 mEq/L (ref 3–11)
BUN: 13.3 mg/dL (ref 7.0–26.0)
CHLORIDE: 109 meq/L (ref 98–109)
CO2: 23 mEq/L (ref 22–29)
Calcium: 9.5 mg/dL (ref 8.4–10.4)
Creatinine: 1.3 mg/dL — ABNORMAL HIGH (ref 0.6–1.1)
EGFR: 45 mL/min/{1.73_m2} — ABNORMAL LOW (ref >90)
GLUCOSE: 101 mg/dL (ref 70–140)
POTASSIUM: 3.8 meq/L (ref 3.5–5.1)
SODIUM: 141 meq/L (ref 136–145)
Total Bilirubin: 0.3 mg/dL (ref 0.20–1.20)
Total Protein: 7.1 g/dL (ref 6.4–8.3)

## 2015-09-08 LAB — CBC WITH DIFFERENTIAL (CANCER CENTER ONLY)
BASO#: 0 10*3/uL (ref 0.0–0.2)
BASO%: 0.2 % (ref 0.0–2.0)
EOS%: 1.6 % (ref 0.0–7.0)
Eosinophils Absolute: 0.1 10*3/uL (ref 0.0–0.5)
HCT: 37.5 % (ref 34.8–46.6)
HGB: 12.7 g/dL (ref 11.6–15.9)
LYMPH#: 2 10*3/uL (ref 0.9–3.3)
LYMPH%: 22.1 % (ref 14.0–48.0)
MCH: 30.8 pg (ref 26.0–34.0)
MCHC: 33.9 g/dL (ref 32.0–36.0)
MCV: 91 fL (ref 81–101)
MONO#: 0.9 10*3/uL (ref 0.1–0.9)
MONO%: 9.9 % (ref 0.0–13.0)
NEUT%: 66.2 % (ref 39.6–80.0)
NEUTROS ABS: 6 10*3/uL (ref 1.5–6.5)
Platelets: 186 10*3/uL (ref 145–400)
RBC: 4.13 10*6/uL (ref 3.70–5.32)
RDW: 14.1 % (ref 11.1–15.7)
WBC: 9 10*3/uL (ref 3.9–10.0)

## 2015-09-08 MED ORDER — EXEMESTANE 25 MG PO TABS: 25.0000 mg | ORAL_TABLET | Freq: Every day | ORAL | Status: DC

## 2015-09-08 NOTE — Progress Notes (Signed)
Hematology and Oncology Follow Up Visit  [name] [id number] 04/22/1961 55 y.o. 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 is back for followup. She looks good. It is her birthday tomorrow. She is looking for to have a nice birthday. She will not have a birthday cake however.  Her inhibin B level and keeps going up. We'll last saw her in February, the level was 112.  We did go ahead and do a repeat CT scan. This shows increase in size of the left pelvic wall nodule. It now measures 3 x 2.2 cm. She has a couple other nodules one measures 15 mm and one measures 13 mm. Everything else looks stable.  At this point, I'm not sure what else we need to do. I will speak with her gynecologic oncologist. It is possible that she may need surgery to resect of this disease. I have seen some case reports of intraperitoneal hyperthermic chemotherapy in situations of recurrent granulosa cell tumors.  She's having no proms go to the bathroom. Having no pain. The may be some occasional discomfort over in the left pelvic area. She takes Tylenol for this.  She's had no bleeding.  She's had no leg swelling.  She's had no cough.  Overall, I would see her performance status is ECOG 0.  Medications:  Current outpatient prescriptions:  .  albuterol (PROVENTIL HFA;VENTOLIN HFA) 108 (90 BASE) MCG/ACT inhaler, Inhale 2 puffs into the lungs every 4 (four) hours as needed for wheezing or shortness of breath., Disp: 1 Inhaler, Rfl: 1 .  aspirin 325 MG EC tablet, Take 325 mg by mouth every morning. , Disp: , Rfl:  .  atorvastatin (LIPITOR) 40 MG tablet, Take 1 tablet (40 mg total) by mouth every evening., Disp: 30 tablet, Rfl: 4 .  b complex vitamins tablet, Take 1 tablet by mouth daily., Disp: , Rfl:  .  carvedilol (COREG) 12.5 MG tablet, TAKE 1 TABLET (12.5 MG TOTAL) BY MOUTH 2 (TWO) TIMES  DAILY WITH A MEAL., Disp: 60 tablet, Rfl: 11 .  cholecalciferol (VITAMIN D-400) 400 UNITS TABS tablet, Take 400 Units by mouth 2 (two) times daily., Disp: , Rfl:  .  diphenhydrAMINE (BENADRYL) 25 MG tablet, Take 25 mg by mouth as needed for itching or allergies. ALLERGIES, Disp: , Rfl:  .  fish oil-omega-3 fatty acids 1000 MG capsule, Take 1 g by mouth 2 (two) times daily., Disp: , Rfl:  .  furosemide (LASIX) 20 MG tablet, Take 1 tablet (20 mg total) by mouth daily., Disp: 90 tablet, Rfl: 3 .  levothyroxine (SYNTHROID, LEVOTHROID) 75 MCG tablet, TAKE 1 TABLET (75 MCG TOTAL) BY MOUTH DAILY BEFORE BREAKFAST., Disp: 30 tablet, Rfl: 3 .  LORazepam (ATIVAN) 0.5 MG tablet, Take 1 tablet (0.5 mg total) by mouth every 6 (six) hours as needed (Nausea or vomiting)., Disp: 60 tablet, Rfl: 2 .  Multiple Vitamins-Minerals (MULTIVITAMIN PO), Take 1 tablet by mouth daily. , Disp: , Rfl:  .  spironolactone (ALDACTONE) 25 MG tablet, TAKE 1 TABLET (25 MG TOTAL) BY MOUTH 2 (TWO) TIMES DAILY., Disp: 60 tablet, Rfl: 11 .  Vitamin D, Ergocalciferol, (DRISDOL) 50000 units CAPS capsule, Take 1 capsule (50,000 Units total) by mouth every 7 (seven) days., Disp: 24 capsule, Rfl: 3 .  zolpidem (AMBIEN) 5 MG tablet, TAKE 1 TABLET BY MOUTH AT BEDTIME,  Disp: 30 tablet, Rfl: 2 .  exemestane (AROMASIN) 25 MG tablet, Take 1 tablet (25 mg total) by mouth daily after breakfast., Disp: 30 tablet, Rfl: 3  Allergies:  Allergies  Allergen Reactions  . Prochlorperazine Edisylate Other (See Comments)    Nervous/ flutter/ shakes--compazine  . Adhesive [Tape] Other (See Comments)    Redness/hives from adhesive tape( tolerates latex gloves); tolerates paper tape  . Prochlorperazine Other (See Comments)    Jitters, hyper    Past Medical History, Surgical history, Social history, and Family History were reviewed and updated.  Review of Systems: As above  Physical Exam:  height is 5\' 6"  (1.676 m) and weight is 219 lb (99.338 kg). Her  oral temperature is 97.6 F (36.4 C). Her blood pressure is 120/70 and her pulse is 88. Her respiration is 18.   Well-developed well-nourished white female. Lungs are clear. Head and neck exam shows no ocular or oral lesion. There is no adenopathy. Lungs are clear. Cardiac exam regular rate and rhythm. She has a 1/6 systolic murmur. Abdomen is soft with well-healed laparotomy scars. She has no fluid wave. No palpable liver or spleen. There is no obvious abdominal mass.There may be some slight tenderness over the left pelvic area. Back exam shows no tenderness over the spine ribs or hips. Extremities shows no clubbing cyanosis or edema. She has good range of motion of her joints. She has good strength in her muscle groups. Skin exam no rashes, ecchymoses or petechia. Neurological exam shows no focal neurological deficits.  Lab Results  Component Value Date   WBC 9.0 09/08/2015   HGB 12.7 09/08/2015   HCT 37.5 09/08/2015   MCV 91 09/08/2015   PLT 186 09/08/2015     Chemistry      Component Value Date/Time   NA 138 08/07/2015 0937   NA 136 06/15/2015 0850   NA 137 06/01/2015 0209   K 3.8 08/07/2015 0937   K 3.7 06/15/2015 0850   K 3.7 06/01/2015 0209   CL 101 06/15/2015 0850   CL 97* 06/01/2015 0209   CO2 20* 08/07/2015 0937   CO2 24 06/15/2015 0850   CO2 28 06/01/2015 0209   BUN 19.1 08/07/2015 0937   BUN 14 06/15/2015 0850   BUN 36* 06/01/2015 0209   CREATININE 1.4* 08/07/2015 0937   CREATININE 1.4* 06/15/2015 0850   CREATININE 1.36* 06/01/2015 0209      Component Value Date/Time   CALCIUM 9.6 08/07/2015 0937   CALCIUM 8.7 06/15/2015 0850   CALCIUM 9.5 06/01/2015 0209   ALKPHOS 79 08/07/2015 0937   ALKPHOS 62 06/15/2015 0850   ALKPHOS 67 05/29/2015 0505   AST 16 08/07/2015 0937   AST 29 06/15/2015 0850   AST 23 05/29/2015 0505   ALT 13 08/07/2015 0937   ALT 35 06/15/2015 0850   ALT 17 05/29/2015 0505   BILITOT 0.35 08/07/2015 0937   BILITOT 0.50 06/15/2015 0850    BILITOT 0.4 05/29/2015 0505         Impression and Plan: [name] is 55 year old white female with a recurrent granulosa cell tumor. She underwent debulking.She did have adjuvant chemotherapy.   For now, I will try her on Aromasin. This is another aromatase inhibitor but more irreversible. This might be reasonable.  I'm not sure she really would want chemotherapy. I'm not sure how well chemotherapy really helps in situations like this.  I think that she might need surgery. I will talk to her gynecologic oncologist regarding this. He is  very very good and knows her well.  I will plan to get her back in about 3 weeks' time.  I'm glad that she will have a nice birthday.  I spent about 30 minutes with her today.    Volanda Napoleon, MD 3/17/201710:35 AM

## 2015-09-12 LAB — INHIBIN B: INHIBIN B: 153.3 pg/mL — AB (ref 0.0–16.9)

## 2015-09-15 ENCOUNTER — Telehealth: Payer: Self-pay | Admitting: Interventional Cardiology

## 2015-09-15 NOTE — Telephone Encounter (Signed)
New Message  [name] was told to inform Dr Tamala Julian of her upcoming surgery- [name] stated that clearance is not required but she was to let our office know about the surgery- she stated she is having tumors removed from her stomach. Please advise.

## 2015-09-15 NOTE — Telephone Encounter (Signed)
Let her know we wish her will and will pray for great success.

## 2015-09-15 NOTE — Telephone Encounter (Signed)
Pt is updating Dr Tamala Julian about tumor removal no clearance needed. Procedure is being done at [facility].

## 2015-09-22 ENCOUNTER — Ambulatory Visit: Payer: 59 | Admitting: Hematology & Oncology

## 2015-09-22 ENCOUNTER — Other Ambulatory Visit: Payer: 59

## 2015-09-26 ENCOUNTER — Ambulatory Visit: Payer: 59 | Attending: Gynecologic Oncology | Admitting: Gynecologic Oncology

## 2015-09-26 ENCOUNTER — Encounter: Payer: Self-pay | Admitting: Gynecologic Oncology

## 2015-09-26 VITALS — BP 96/56 | HR 79 | Temp 98.3°F | Resp 16 | Wt 215.6 lb

## 2015-09-26 DIAGNOSIS — D3911 Neoplasm of uncertain behavior of right ovary: Secondary | ICD-10-CM | POA: Diagnosis not present

## 2015-09-26 DIAGNOSIS — I472 Ventricular tachycardia: Secondary | ICD-10-CM | POA: Insufficient documentation

## 2015-09-26 DIAGNOSIS — I252 Old myocardial infarction: Secondary | ICD-10-CM | POA: Insufficient documentation

## 2015-09-26 DIAGNOSIS — Z96649 Presence of unspecified artificial hip joint: Secondary | ICD-10-CM | POA: Diagnosis not present

## 2015-09-26 DIAGNOSIS — I2510 Atherosclerotic heart disease of native coronary artery without angina pectoris: Secondary | ICD-10-CM | POA: Insufficient documentation

## 2015-09-26 DIAGNOSIS — E039 Hypothyroidism, unspecified: Secondary | ICD-10-CM | POA: Insufficient documentation

## 2015-09-26 DIAGNOSIS — Z950 Presence of cardiac pacemaker: Secondary | ICD-10-CM | POA: Insufficient documentation

## 2015-09-26 DIAGNOSIS — F17210 Nicotine dependence, cigarettes, uncomplicated: Secondary | ICD-10-CM | POA: Diagnosis not present

## 2015-09-26 DIAGNOSIS — I509 Heart failure, unspecified: Secondary | ICD-10-CM | POA: Insufficient documentation

## 2015-09-26 DIAGNOSIS — N135 Crossing vessel and stricture of ureter without hydronephrosis: Secondary | ICD-10-CM | POA: Insufficient documentation

## 2015-09-26 DIAGNOSIS — Z95810 Presence of automatic (implantable) cardiac defibrillator: Secondary | ICD-10-CM | POA: Diagnosis not present

## 2015-09-26 DIAGNOSIS — M1990 Unspecified osteoarthritis, unspecified site: Secondary | ICD-10-CM | POA: Insufficient documentation

## 2015-09-26 DIAGNOSIS — Z9889 Other specified postprocedural states: Secondary | ICD-10-CM | POA: Diagnosis not present

## 2015-09-26 DIAGNOSIS — C569 Malignant neoplasm of unspecified ovary: Secondary | ICD-10-CM

## 2015-09-26 DIAGNOSIS — E7800 Pure hypercholesterolemia, unspecified: Secondary | ICD-10-CM | POA: Diagnosis not present

## 2015-09-26 DIAGNOSIS — Z7982 Long term (current) use of aspirin: Secondary | ICD-10-CM | POA: Insufficient documentation

## 2015-09-26 DIAGNOSIS — G629 Polyneuropathy, unspecified: Secondary | ICD-10-CM | POA: Insufficient documentation

## 2015-09-26 DIAGNOSIS — Z8551 Personal history of malignant neoplasm of bladder: Secondary | ICD-10-CM | POA: Insufficient documentation

## 2015-09-26 NOTE — Patient Instructions (Signed)
The steri strips on your incision should stay in place for around one week.  After that, you can remove them.  Plan to follow up on April 28 or sooner if needed.  Please call for any questions or concerns.

## 2015-09-27 ENCOUNTER — Telehealth: Payer: Self-pay | Admitting: Gynecologic Oncology

## 2015-09-27 ENCOUNTER — Encounter: Payer: Self-pay | Admitting: Gynecologic Oncology

## 2015-09-27 NOTE — Progress Notes (Signed)
Follow Up Note: Gyn-Onc  [name] 55 y.o. female  CC:  Chief Complaint  Patient presents with  . Granulosa Cell Tumor of ovary    Follow up post-op    HPI:  [name] is a 55 year old female initially seen in consultation at the request of Dr. Burney Gauze regarding management of recurrent granulosa cell tumor.  She first underwent surgery in Feb 2007 for a stage I C granulosa cell tumor. She received 3 cycles of bleomycin, etoposide, and cisplatin chemotherapy as an adjunct.  Due to recurrence on CT imaging, she underwent a radical debulking of recurrent ovarian cancer including rectosigmoid resection with low rectal anastomosis, resection of right distal ureter, ureteroneocystostomy with psoas hitch, omentectomy with Dr. Fermin Schwab on 01/19/13.  Postoperatively, adjuvant therapy was recommended using carboplatin and Taxol. She received 4 cycles but did not tolerated very well and therefore discontinued. He CT scan in January 2015 showed a subcutaneous nodule in the lower abdomen and the inhibin B is a 39 units per mL.  On 07/27/2013, she underwent a wide local excision of subcutaneous tumor nodule (3 cm) with Dr. Fermin Schwab for recurrent disease.  She was placed on Femara and has been tolerating well.  Recently, CT imaging was performed on 06/15/15 that resulted mixed treatment response in the peritoneal tumor implants, significantly decreased size of the right paracolic gutter tumor implants, interval growth of the left pelvic side wall tumor, with no new sites of metastatic disease.  She was advised to continue taking the Femara with follow up scan for Feb or March.  Inhibin B increasing with value in Feb 2017 at 112.  Follow up imaging in March 2017 resulted: Mild progression of tumor in the left pelvis with increasing sidewall and perirectal nodularity. 2. Stable minimal nodularity in the right pericolic gutter. No other peritoneal disease identified. 3. No new lesions  identified.     On September 19, 2015, she underwent an Exploratory laparotomy, radical resection of recurrent granulosa cell tumor including complete dissection of the right pararectal space and excision of tumor nodules, left ureterolysis for retroperitoneal fibrosis, resection of nodule from the left obturator space, resection of nodules from the right paracolic gutter, right para-aortic lymphadenectomy by Dr. Fermin Schwab.  Her post-operative course was uneventful except for low grade temperatures on post-op day 3.  Blood cultures still pending.  Urine culture with mixed flora.  No further fevers reported during hospital stay.  Final path revealed: Final Diagnosis  A: Gutter nodule, left paracolic, removal  - Implant of granulosa cell tumor, at least 1.0 cm (see comment)  B: Lymph node, right paraaortic, removal  - No tumor identified in one lymph node (0/1)  C: Mass and right ovarian vessels, excision  - Segment of right fallopian tube with evidence of prior surgical transection, focal dilatation, and chronic perisalpingitis  - Medium-caliber vessels with intraluminal thrombus  - No granulosa cell tumor identified  D: Pelvic side wall nodule, right, biopsy  - Two implants of granulosa cell tumor, 0.7 cm and 0.6 cm  E: Mass, left pararectal, biopsy  - Cystic implant of granulosa cell tumor, 0.8 cm  F: Mass, left, pararectal, biopsy  - Three implants of granulosa cell tumor, up to 1.2 cm  - One lymph node negative for granulosa cell tumor (0/1)  G: Nodule, left pelvis bladder, biopsy  - Minute focus of granulosa cell tumor identified at the edge of fibrovascular and adipose tissue, may represent artifact  H: Pelvic mass, deep left,  excision  - Implant of granulosa cell tumor, 2.2 cm, with cystic change Electronically signed by Marilynne Halsted, MD on 09/20/2015 at 1434 Comment  The tumor implants noted in specimens A and D-H display the characteristic architectural and cytologic features  of a granulosa cell tumor, including microfollicular pattern, blood-filled cysts, nuclear grooves, and Call-Exner bodies. In some sections, the tumor nodules have apparent fibrous capsules. We favor that these are reactive capsules, but cannot exclude the possibility of granulosa cell tumor involving completely-replaced lymph nodes.   Interval History:  She presents today with her mother for post-operative follow up and staple removal from surgery at [facility] on 09/19/15.  She reports doing well since surgery.  Tolerating diet and appetite increasing.  No nausea or emesis reported.  Ambulating without difficulty with no edema in lower extremities reported.  Bowels and bladder functioning without difficulty.  Minimal pain reported.  Stating she had only taken 4 pain pills.  No concerns voiced about her incision.  She reports having a low grade temp at night time with intermittent chills for the past two nights with her temp running 98.9 but she reports having a subnormal temperature normally.  Also reports having low grade temperatures while inpatient at [facility].  Urine culture and blood cultures were sent.  No concerns voiced.   Review of Systems Constitutional: Feels well.  Intermittent low grade temperatures with chills.  No early satiety.  Appetite increasing. Cardiovascular: No chest pain, shortness of breath, or edema.  Pulmonary: No cough or wheeze.  Gastrointestinal: No nausea, vomiting, or diarrhea. No bright red blood per rectum or change in bowel movement.  Genitourinary: No frequency, urgency, or dysuria. No vaginal bleeding or discharge.  Musculoskeletal: No myalgia or joint pain. Neurologic: No weakness, numbness, or change in gait.  Psychology: No depression, anxiety, or insomnia.  Current Meds:  Outpatient Encounter Prescriptions as of 09/26/2015  Medication Sig  . albuterol (PROVENTIL HFA;VENTOLIN HFA) 108 (90 BASE) MCG/ACT inhaler Inhale 2 puffs into the lungs every 4 (four) hours as needed for  wheezing or shortness of breath.  Marland Kitchen aspirin 325 MG EC tablet Take 325 mg by mouth every morning.   Marland Kitchen atorvastatin (LIPITOR) 40 MG tablet Take 1 tablet (40 mg total) by mouth every evening.  Marland Kitchen b complex vitamins tablet Take 1 tablet by mouth daily.  . carvedilol (COREG) 12.5 MG tablet TAKE 1 TABLET (12.5 MG TOTAL) BY MOUTH 2 (TWO) TIMES DAILY WITH A MEAL.  . cholecalciferol (VITAMIN D-400) 400 UNITS TABS tablet Take 400 Units by mouth 2 (two) times daily.  . diphenhydrAMINE (BENADRYL) 25 MG tablet Take 25 mg by mouth as needed for itching or allergies. ALLERGIES  . exemestane (AROMASIN) 25 MG tablet Take 1 tablet (25 mg total) by mouth daily after breakfast.  . fish oil-omega-3 fatty acids 1000 MG capsule Take 1 g by mouth 2 (two) times daily.  . furosemide (LASIX) 20 MG tablet Take 1 tablet (20 mg total) by mouth daily.  Marland Kitchen levothyroxine (SYNTHROID, LEVOTHROID) 75 MCG tablet TAKE 1 TABLET (75 MCG TOTAL) BY MOUTH DAILY BEFORE BREAKFAST.  Marland Kitchen LORazepam (ATIVAN) 0.5 MG tablet Take 1 tablet (0.5 mg total) by mouth every 6 (six) hours as needed (Nausea or vomiting).  . Multiple Vitamins-Minerals (MULTIVITAMIN PO) Take 1 tablet by mouth daily.   Marland Kitchen spironolactone (ALDACTONE) 25 MG tablet TAKE 1 TABLET (25 MG TOTAL) BY MOUTH 2 (TWO) TIMES DAILY.  Marland Kitchen Vitamin D, Ergocalciferol, (DRISDOL) 50000 units CAPS capsule Take 1 capsule (50,000  Units total) by mouth every 7 (seven) days.  Marland Kitchen zolpidem (AMBIEN) 5 MG tablet TAKE 1 TABLET BY MOUTH AT BEDTIME   No facility-administered encounter medications on file as of 09/26/2015.    Allergy:  Allergies  Allergen Reactions  . Prochlorperazine Edisylate Other (See Comments)    Nervous/ flutter/ shakes--compazine  . Adhesive [Tape] Other (See Comments)    Redness/hives from adhesive tape( tolerates latex gloves); tolerates paper tape  . Prochlorperazine Other (See Comments)    Jitters, hyper    Social Hx:   Social History   Social History  . Marital Status:  Divorced    Spouse Name: N/A  . Number of Children: N/A  . Years of Education: N/A   Occupational History  .      WORKS FULL TIME   Social History Main Topics  . Smoking status: Current Some Day Smoker -- 0.50 packs/day for 30 years    Types: Cigarettes    Start date: 03/28/1974  . Smokeless tobacco: Never Used     Comment:  10-27-13  still smoking daily  . Alcohol Use: No  . Drug Use: No  . Sexual Activity: Not Currently   Other Topics Concern  . Not on file   Social History Narrative   WORKS FULL TIME   SINGLE   TOBACCO USE-YES   IMPLANTATION OF DUAL-CHAMBER St. JUDE DEFIBRILLATOR    Past Surgical Hx:  Past Surgical History  Procedure Laterality Date  . Cardiac defibrillator placement  02/06/2012    "lead change"  . Insert / replace / remove pacemaker  10/2009    [place]uck  . Tonsillectomy and adenoidectomy  ~ 1967  . Appendectomy  1989  . Vaginal hysterectomy  2001  . Tubal ligation  1993  . Bilateral oophorectomy  2007  . Mitral valve replacement  2011    "pig valve"  . Cardiac valve replacement    . Transurethral resection of bladder  2009    "for bladder cancer"  . Thymus removed    . Total hip arthroplasty  05/06/2012    Procedure: TOTAL HIP ARTHROPLASTY;  Surgeon: Gearlean Alf, MD;  Location: [facility];  Service: Orthopedics;  Laterality: Right;  . Laparotomy N/A 01/19/2013    Procedure: TUMOR DEBULKING / BOWEL RESECTION/INSERTION RIGHT UTERERAL DOUBLE J STENT/OMENTECTOMY;  Surgeon: Alvino Chapel, MD;  Location: [facility];  Service: Gynecology;  Laterality: N/A;  . Cystoscopy Right 02/23/2013    Procedure: CYSTOSCOPY WITH STENT REMOVAL;  Surgeon: Alvino Chapel, MD;  Location: [facility];  Service: Gynecology;  Laterality: Right;  . Abdominal hysterectomy N/A 07/27/2013    Procedure: TUMOR EXCISION OF ABDOMINAL MASS;  Surgeon: Alvino Chapel, MD;  Location: [facility];  Service: Gynecology;  Laterality: N/A;  .  Implantable cardioverter defibrillator revision N/A 02/06/2012    Procedure: IMPLANTABLE CARDIOVERTER DEFIBRILLATOR REVISION;  Surgeon: Evans Lance, MD;  Location[facility] C[facility] LAB;  Service: Cardiovascular;  Laterality: N/A;    Past Medical Hx:  Past Medical History  Diagnosis Date  . Arrhythmia     ventricular tachycardia  . Unspecified systolic heart failure   . Ventricular fibrillation ([place])   . ICD (implantable cardiac defibrillator) in place   . Pacemaker   . High cholesterol   . CHF (congestive heart failure) ([place])   . Myocardial infarction ([place]) 2011  . Hypothyroidism   . Arthritis 02/06/2012    "everywhere"  . Bladder cancer [facility]) 2009    "injected medicine to get  rid of it"  . Granulosa cell carcinoma of ovary ([place]) 2007    right; "had chemo"  . Pneumonia     as child  . Neuropathy ([facility])     FEET AND HANDS - FROM CHEMO - BUT MUCH IMPROVED  . Port-a-cath in place     RIGHT UPPER CHEST  . Pain     JOINT PAINS AND MUSCLE ACHES ALL OVER.  Marland Kitchen Automatic implantable cardioverter-defibrillator in situ     DR. Beckie Salts   . Coronary artery disease     DR. Linard Millers  . Asthma     as a child    Family Hx:  Family History  Problem Relation Age of Onset  . Heart attack Maternal Grandfather   . Heart attack Paternal Grandfather   . Stroke Maternal Grandmother   . Heart attack Brother     Vitals:  Blood pressure 96/56, pulse 79, temperature 98.3 F (36.8 C), temperature source Oral, resp. rate 16, weight 215 lb 9.6 oz (97.796 kg).  Physical Exam:  General: Well developed, well nourished female in no acute distress. Alert and oriented x 3.  Cardiovascular: Regular rate and rhythm. S1 and S2 normal.  Lungs: Clear to auscultation bilaterally. No wheezes/crackles/rhonchi noted.  Skin: No rashes or lesions present. Back: No CVA tenderness.  Abdomen: Abdomen soft, non-tender and mildly obese. Active bowel sounds in all quadrants. 22 staples removed from the midline incision  without diffifculty.  No drainage or erythema noted.  1/2 inch steri strips applied.  Extremities: No bilateral cyanosis, edema, or clubbing.   Assessment/Plan:  55 year old female s/p exploratory laparotomy, radical resection of recurrent granulosa cell tumor including complete dissection of the right pararectal space and excision of tumor nodules, left ureterolysis for retroperitoneal fibrosis, resection of nodule from the left obturator space, resection of nodules from the right paracolic gutter, right para-aortic lymphadenectomy by Dr. Fermin Schwab on 09/19/15 for recurrent granulosa cell tumor.  She is doing well post-operatively.  Advised to continue monitoring her temperature and to call our office with an update in the am.  Blood cultures pending from hospital stay at [facility].  Urine culture resulted mixed flora.  Post-operative instructions reinforced.  Follow up appointment made in [facility] with Dr. Fermin Schwab for April 28 or sooner if needed.  Reportable signs and symptoms reviewed.  She is advised to call for any questions or concerns.  We will also contact her when the results of her blood cultures are available.    CROSS, MELISSA DEAL, NP 09/27/2015, 1:40 PM

## 2015-09-27 NOTE — Telephone Encounter (Signed)
Called to check on patient's current status.  Stating she is doing well with no elevated temperature or chills last pm. Advised to continue to monitor.  Advised to call our office for any changes.  Informed of blood culture results from [facility].

## 2015-10-03 ENCOUNTER — Other Ambulatory Visit: Payer: Self-pay | Admitting: Hematology & Oncology

## 2015-10-12 ENCOUNTER — Encounter: Payer: Self-pay | Admitting: Internal Medicine

## 2015-10-12 ENCOUNTER — Telehealth: Payer: Self-pay | Admitting: Internal Medicine

## 2015-10-12 NOTE — Telephone Encounter (Signed)
Patient made aware of atrial lead impedance out of range. She is asymptomatic. I will review with Dr. Lovena Le when he is in the office tomorrow and call her back with any recommendations. She is agreeable.

## 2015-10-12 NOTE — Telephone Encounter (Signed)
New Message  Pt called states that her defib vibrated a few times this morning. Req a call back to discuss

## 2015-10-12 NOTE — Telephone Encounter (Signed)
Spoke w/ and requested that she send a manual transmission from her home monitor. Pt is at work and said she would do it sometime today. It may be after 5 PM. Pt is aware that if it is after 5 PM she may not get a call back until tomorrow. Pt stated that she feels fine. She stated that they device vibrated twice about a minute apart.

## 2015-10-13 NOTE — Telephone Encounter (Signed)
Reviewed with GT- no intervention necessary due to stable sensing, threshold and low pacing percentage. Patient made aware, she is agreeable. See scanned documentation.

## 2015-10-20 ENCOUNTER — Encounter: Payer: Self-pay | Admitting: Gynecology

## 2015-10-20 ENCOUNTER — Ambulatory Visit: Payer: 59 | Attending: Gynecology | Admitting: Gynecology

## 2015-10-20 VITALS — BP 105/62 | HR 82 | Temp 97.8°F | Resp 18 | Ht 66.0 in | Wt 210.3 lb

## 2015-10-20 DIAGNOSIS — E039 Hypothyroidism, unspecified: Secondary | ICD-10-CM | POA: Diagnosis not present

## 2015-10-20 DIAGNOSIS — I252 Old myocardial infarction: Secondary | ICD-10-CM | POA: Diagnosis not present

## 2015-10-20 DIAGNOSIS — F17210 Nicotine dependence, cigarettes, uncomplicated: Secondary | ICD-10-CM | POA: Insufficient documentation

## 2015-10-20 DIAGNOSIS — Z952 Presence of prosthetic heart valve: Secondary | ICD-10-CM | POA: Diagnosis not present

## 2015-10-20 DIAGNOSIS — Z79899 Other long term (current) drug therapy: Secondary | ICD-10-CM | POA: Insufficient documentation

## 2015-10-20 DIAGNOSIS — Z96649 Presence of unspecified artificial hip joint: Secondary | ICD-10-CM | POA: Insufficient documentation

## 2015-10-20 DIAGNOSIS — E7800 Pure hypercholesterolemia, unspecified: Secondary | ICD-10-CM | POA: Insufficient documentation

## 2015-10-20 DIAGNOSIS — M1990 Unspecified osteoarthritis, unspecified site: Secondary | ICD-10-CM | POA: Diagnosis not present

## 2015-10-20 DIAGNOSIS — Z95810 Presence of automatic (implantable) cardiac defibrillator: Secondary | ICD-10-CM | POA: Insufficient documentation

## 2015-10-20 DIAGNOSIS — C569 Malignant neoplasm of unspecified ovary: Secondary | ICD-10-CM | POA: Diagnosis not present

## 2015-10-20 DIAGNOSIS — Z7982 Long term (current) use of aspirin: Secondary | ICD-10-CM | POA: Diagnosis not present

## 2015-10-20 DIAGNOSIS — I509 Heart failure, unspecified: Secondary | ICD-10-CM | POA: Diagnosis not present

## 2015-10-20 NOTE — Progress Notes (Signed)
Follow Up Note: Gyn-Onc  [name] 55 y.o. female  CC:  Chief Complaint  Patient presents with  . 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 Good postoperative recovery. The patient given the okay to return to full levels of activity as she feels up to it. She will return to see me in early June at which time we will discuss the pros and cons of starting combination of tamoxifen and Megace. We'll obtain a new inhibin as a baseline at that visit.  Interval history: Patient returns today for postoperative checkup. Overall she's doing well. She's using the abdominal binder and feels this makes her abdomen feels much better. Otherwise she is not really having any pain. She denies any GI or GU symptoms she has worked return to work [employer] job) and is doing well except slightly fatigued at the end of the day. She is finishing her Lovenox in 3 days.   HPI:  [name] is a 55 year old female initially seen in consultation at the request of Dr. Burney Gauze regarding management of recurrent granulosa cell tumor.  She first underwent surgery in Feb 2007 for a stage I C granulosa cell tumor. She received 3 cycles of bleomycin, etoposide, and cisplatin chemotherapy as an adjunct.  Due to recurrence on CT imaging, she underwent a radical debulking of recurrent ovarian cancer including rectosigmoid resection with low rectal anastomosis, resection of right distal ureter, ureteroneocystostomy with psoas hitch, omentectomy with Dr. Fermin Schwab on 01/19/13.  Postoperatively, adjuvant therapy was recommended using carboplatin and Taxol. She received 4 cycles but did not tolerated very well and therefore discontinued. He CT scan in January 2015 showed a subcutaneous nodule in the lower abdomen and the inhibin B is a 39 units per mL.  On 07/27/2013, she underwent a  wide local excision of subcutaneous tumor nodule (3 cm) with Dr. Fermin Schwab for recurrent disease.  She was placed on Femara and has been tolerating well.  Recently, CT imaging was performed on 06/15/15 that resulted mixed treatment response in the peritoneal tumor implants, significantly decreased size of the right paracolic gutter tumor implants, interval growth of the left pelvic side wall tumor, with no new sites of metastatic disease.  She was advised to continue taking the Femara with follow up scan for Feb or March.  Inhibin B increasing with value in Feb 2017 at 112.  Follow up imaging in March 2017 resulted: Mild progression of tumor in the left pelvis with increasing sidewall and perirectal nodularity. 2. Stable minimal nodularity in the right pericolic gutter. No other peritoneal disease identified. 3. No new lesions identified.     On September 19, 2015, she underwent an Exploratory laparotomy, radical resection of recurrent granulosa cell tumor including complete dissection of the right pararectal space and excision of tumor nodules, left ureterolysis for retroperitoneal fibrosis, resection of nodule from the left obturator space, resection of nodules from the right paracolic gutter, right para-aortic lymphadenectomy by Dr. Fermin Schwab.  Her post-operative course was uneventful except for low grade temperatures on post-op day 3.  Blood cultures still pending.  Urine culture with mixed flora.  No further fevers reported during hospital stay.  Final path revealed: Final Diagnosis  A: Gutter nodule, left paracolic, removal  - Implant of granulosa cell tumor, at least 1.0 cm (see comment)  B: Lymph node, right paraaortic, removal  - No tumor  identified in one lymph node (0/1)  C: Mass and right ovarian vessels, excision  - Segment of right fallopian tube with evidence of prior surgical transection, focal dilatation, and chronic perisalpingitis  - Medium-caliber vessels with intraluminal  thrombus  - No granulosa cell tumor identified  D: Pelvic side wall nodule, right, biopsy  - Two implants of granulosa cell tumor, 0.7 cm and 0.6 cm  E: Mass, left pararectal, biopsy  - Cystic implant of granulosa cell tumor, 0.8 cm  F: Mass, left, pararectal, biopsy  - Three implants of granulosa cell tumor, up to 1.2 cm  - One lymph node negative for granulosa cell tumor (0/1)  G: Nodule, left pelvis bladder, biopsy  - Minute focus of granulosa cell tumor identified at the edge of fibrovascular and adipose tissue, may represent artifact  H: Pelvic mass, deep left, excision  - Implant of granulosa cell tumor, 2.2 cm, with cystic change   Comment  The tumor implants noted in specimens A and D-H display the characteristic architectural and cytologic features of a granulosa cell tumor, including microfollicular pattern, blood-filled cysts, nuclear grooves, and Call-Exner bodies. In some sections, the tumor nodules have apparent fibrous capsules. We favor that these are reactive capsules, but cannot exclude the possibility of granulosa cell tumor involving completely-replaced lymph nodes. The tumor was 3+ progesterone receptor, 1+ estrogen receptor positive.     Review of Systems 10 point Are answered review of systems is negative except as noted above.  Vitals:  Blood pressure 105/62, pulse 82, temperature 97.8 F (36.6 C), temperature source Oral, resp. rate 18, height 5\' 6"  (1.676 m), weight 210 lb 4.8 oz (95.391 kg), SpO2 100 %.  Physical Exam:  General: Well developed, well nourished female in no acute distress. Alert and oriented x 3.  Cardiovascular: Regular rate and rhythm. S1 and S2 normal.  Lungs: Clear to auscultation bilaterally. No wheezes/crackles/rhonchi noted.  Skin: No rashes or lesions present. Back: No CVA tenderness.  Abdomen: Abdomen soft, non-tender and mildly obese. Active bowel sounds in all quadrants. Midline incisions well-healed.  Extremities: No bilateral  cyanosis, edema, or clubbing.   Pelvic exam: Deferred  Current Meds:  Outpatient Encounter Prescriptions as of 10/20/2015  Medication Sig  . albuterol (PROVENTIL HFA;VENTOLIN HFA) 108 (90 BASE) MCG/ACT inhaler Inhale 2 puffs into the lungs every 4 (four) hours as needed for wheezing or shortness of breath.  Marland Kitchen b complex vitamins tablet Take 1 tablet by mouth daily.  . carvedilol (COREG) 12.5 MG tablet TAKE 1 TABLET (12.5 MG TOTAL) BY MOUTH 2 (TWO) TIMES DAILY WITH A MEAL.  . cholecalciferol (VITAMIN D-400) 400 UNITS TABS tablet Take 400 Units by mouth 2 (two) times daily.  . diphenhydrAMINE (BENADRYL) 25 MG tablet Take 25 mg by mouth as needed for itching or allergies. ALLERGIES  . enoxaparin (LOVENOX) 40 MG/0.4ML injection Inject 40 mg into the skin.  Marland Kitchen exemestane (AROMASIN) 25 MG tablet Take 1 tablet (25 mg total) by mouth daily after breakfast.  . fish oil-omega-3 fatty acids 1000 MG capsule Take 1 g by mouth 2 (two) times daily.  . furosemide (LASIX) 20 MG tablet Take 1 tablet (20 mg total) by mouth daily.  Marland Kitchen LORazepam (ATIVAN) 0.5 MG tablet TAKE ONE TABLET BY MOUTH EVERY SIX HOURS AS NEEDED FOR NAUSEA OR VOMITING  . Multiple Vitamins-Minerals (MULTIVITAMIN PO) Take 1 tablet by mouth daily.   . ondansetron (ZOFRAN-ODT) 4 MG disintegrating tablet   . spironolactone (ALDACTONE) 25 MG tablet TAKE 1 TABLET (25  MG TOTAL) BY MOUTH 2 (TWO) TIMES DAILY.  Marland Kitchen Vitamin D, Ergocalciferol, (DRISDOL) 50000 units CAPS capsule Take 1 capsule (50,000 Units total) by mouth every 7 (seven) days.  Marland Kitchen zolpidem (AMBIEN) 5 MG tablet TAKE 1 TABLET BY MOUTH AT BEDTIME  . aspirin 325 MG EC tablet Take 325 mg by mouth every morning. Reported on 10/20/2015  . [DISCONTINUED] atorvastatin (LIPITOR) 40 MG tablet Take 1 tablet (40 mg total) by mouth every evening.  . [DISCONTINUED] levothyroxine (SYNTHROID, LEVOTHROID) 75 MCG tablet TAKE 1 TABLET (75 MCG TOTAL) BY MOUTH DAILY BEFORE BREAKFAST.   No facility-administered  encounter medications on file as of 10/20/2015.    Allergy:  Allergies  Allergen Reactions  . Prochlorperazine Edisylate Other (See Comments)    Nervous/ flutter/ shakes--compazine  . Adhesive [Tape] Other (See Comments)    Redness/hives from adhesive tape( tolerates latex gloves); tolerates paper tape  . Prochlorperazine Other (See Comments)    Jitters, hyper    Social Hx:   Social History   Social History  . Marital Status: Divorced    Spouse Name: N/A  . Number of Children: N/A  . Years of Education: N/A   Occupational History  .      WORKS FULL TIME   Social History Main Topics  . Smoking status: Current Some Day Smoker -- 0.50 packs/day for 30 years    Types: Cigarettes    Start date: 03/28/1974  . Smokeless tobacco: Never Used     Comment:  10-27-13  still smoking daily  . Alcohol Use: No  . Drug Use: No  . Sexual Activity: Not Currently   Other Topics Concern  . Not on file   Social History Narrative   WORKS FULL TIME   SINGLE   TOBACCO USE-YES   IMPLANTATION OF DUAL-CHAMBER St. JUDE DEFIBRILLATOR    Past Surgical Hx:  Past Surgical History  Procedure Laterality Date  . Cardiac defibrillator placement  02/06/2012    "lead change"  . Insert / replace / remove pacemaker  10/2009    [place]  . Tonsillectomy and adenoidectomy  ~ 1967  . Appendectomy  1989  . Vaginal hysterectomy  2001  . Tubal ligation  1993  . Bilateral oophorectomy  2007  . Mitral valve replacement  2011    "pig valve"  . Cardiac valve replacement    . Transurethral resection of bladder  2009    "for bladder cancer"  . Thymus removed    . Total hip arthroplasty  05/06/2012    Procedure: TOTAL HIP ARTHROPLASTY;  Surgeon: Gearlean Alf, MD;  Location: [facility];  Service: Orthopedics;  Laterality: Right;  . Laparotomy N/A 01/19/2013    Procedure: TUMOR DEBULKING / BOWEL RESECTION/INSERTION RIGHT UTERERAL DOUBLE J STENT/OMENTECTOMY;  Surgeon: Alvino Chapel, MD;  Location: [facility];  Service: Gynecology;  Laterality: N/A;  . Cystoscopy Right 02/23/2013    Procedure: CYSTOSCOPY WITH STENT REMOVAL;  Surgeon: Alvino Chapel, MD;  Location: [facility];  Service: Gynecology;  Laterality: Right;  . Abdominal hysterectomy N/A 07/27/2013    Procedure: TUMOR EXCISION OF ABDOMINAL MASS;  Surgeon: Alvino Chapel, MD;  Location: [facility];  Service: Gynecology;  Laterality: N/A;  . Implantable cardioverter defibrillator revision N/A 02/06/2012    Procedure: IMPLANTABLE CARDIOVERTER DEFIBRILLATOR REVISION;  Surgeon: Evans Lance, MD;  Location: [facility] CATH LAB;  Service: Cardiovascular;  Laterality: N/A;    Past Medical Hx:  Past Medical History  Diagnosis Date  .  Arrhythmia     ventricular tachycardia  . Unspecified systolic heart failure   . Ventricular fibrillation ([place])   . ICD (implantable cardiac defibrillator) in place   . Pacemaker   . High cholesterol   . CHF (congestive heart failure) ([place])   . Myocardial infarction ([place]) 2011  . Hypothyroidism   . Arthritis 02/06/2012    "everywhere"  . Bladder cancer [facility]) 2009    "injected medicine to get rid of it"  . Granulosa cell carcinoma of ovary ([place]) 2007    right; "had chemo"  . Pneumonia     as child  . Neuropathy ([facility])     FEET AND HANDS - FROM CHEMO - BUT MUCH IMPROVED  . Port-a-cath in place     RIGHT UPPER CHEST  . Pain     JOINT PAINS AND MUSCLE ACHES ALL OVER.  Marland Kitchen Automatic implantable cardioverter-defibrillator in situ     DR. Beckie Salts   . Coronary artery disease     DR. Linard Millers  . Asthma     as a child    Family Hx:  Family History  Problem Relation Age of Onset  . Heart attack Maternal Grandfather   . Heart attack Paternal Grandfather   . Stroke Maternal Grandmother   . Heart attack Brother         CLARKE-PEARSON,Amarie Viles L, NP 10/20/2015, 11:32 AM

## 2015-10-20 NOTE — Patient Instructions (Signed)
Plan to follow up on June 2 or sooner if needed.  Please call for any questions or concerns.  Wait two more weeks before starting to lift heavier things.  Tamoxifen oral tablet What is this medicine? TAMOXIFEN (ta MOX i fen) blocks the effects of estrogen. It is commonly used to treat breast cancer. It is also used to decrease the chance of breast cancer coming back in women who have received treatment for the disease. It may also help prevent breast cancer in women who have a high risk of developing breast cancer. This medicine may be used for other purposes; ask your health care provider or pharmacist if you have questions. What should I tell my health care provider before I take this medicine? They need to know if you have any of these conditions: -blood clots -blood disease -cataracts or impaired eyesight -endometriosis -high calcium levels -high cholesterol -irregular menstrual cycles -liver disease -stroke -uterine fibroids -an unusual or allergic reaction to tamoxifen, other medicines, foods, dyes, or preservatives -pregnant or trying to get pregnant -breast-feeding How should I use this medicine? Take this medicine by mouth with a glass of water. Follow the directions on the prescription label. You can take it with or without food. Take your medicine at regular intervals. Do not take your medicine more often than directed. Do not stop taking except on your doctor's advice. A special MedGuide will be given to you by the pharmacist with each prescription and refill. Be sure to read this information carefully each time. Talk to your pediatrician regarding the use of this medicine in children. While this drug may be prescribed for selected conditions, precautions do apply. Overdosage: If you think you have taken too much of this medicine contact a poison control center or emergency room at once. NOTE: This medicine is only for you. Do not share this medicine with others. What if I miss  a dose? If you miss a dose, take it as soon as you can. If it is almost time for your next dose, take only that dose. Do not take double or extra doses. What may interact with this medicine? -aminoglutethimide -bromocriptine -chemotherapy drugs -female hormones, like estrogens and birth control pills -letrozole -medroxyprogesterone -phenobarbital -rifampin -warfarin This list may not describe all possible interactions. Give your health care provider a list of all the medicines, herbs, non-prescription drugs, or dietary supplements you use. Also tell them if you smoke, drink alcohol, or use illegal drugs. Some items may interact with your medicine. What should I watch for while using this medicine? Visit your doctor or health care professional for regular checks on your progress. You will need regular pelvic exams, breast exams, and mammograms. If you are taking this medicine to reduce your risk of getting breast cancer, you should know that this medicine does not prevent all types of breast cancer. If breast cancer or other problems occur, there is no guarantee that it will be found at an early stage. Do not become pregnant while taking this medicine or for 2 months after stopping this medicine. Stop taking this medicine if you get pregnant or think you are pregnant and contact your doctor. This medicine may harm your unborn baby. Women who can possibly become pregnant should use birth control methods that do not use hormones during tamoxifen treatment and for 2 months after therapy has stopped. Talk with your health care provider for birth control advice. Do not breast feed while taking this medicine. What side effects may I notice  from receiving this medicine? Side effects that you should report to your doctor or health care professional as soon as possible: -changes in vision (blurred vision) -changes in your menstrual cycle -difficulty breathing or shortness of breath -difficulty walking or  talking -new breast lumps -numbness -pelvic pain or pressure -redness, blistering, peeling or loosening of the skin, including inside the mouth -skin rash or itching (hives) -sudden chest pain -swelling of lips, face, or tongue -swelling, pain or tenderness in your calf or leg -unusual bruising or bleeding -vaginal discharge that is bloody, brown, or rust -weakness -yellowing of the whites of the eyes or skin Side effects that usually do not require medical attention (report to your doctor or health care professional if they continue or are bothersome): -fatigue -hair loss, although uncommon and is usually mild -headache -hot flashes -impotence (in men) -nausea, vomiting (mild) -vaginal discharge (white or clear) This list may not describe all possible side effects. Call your doctor for medical advice about side effects. You may report side effects to FDA at 1-800-FDA-1088. Where should I keep my medicine? Keep out of the reach of children. Store at room temperature between 20 and 25 degrees C (68 and 77 degrees F). Protect from light. Keep container tightly closed. Throw away any unused medicine after the expiration date. NOTE: This sheet is a summary. It may not cover all possible information. If you have questions about this medicine, talk to your doctor, pharmacist, or health care provider.    2016, Elsevier/Gold Standard. (2008-02-25 12:01:56)  Megestrol tablets What is this medicine? MEGESTROL (me JES trol) belongs to a class of drugs known as progestins. Megestrol tablets are used to treat advanced breast or endometrial cancer. This medicine may be used for other purposes; ask your health care provider or pharmacist if you have questions. What should I tell my health care provider before I take this medicine? They need to know if you have any of these conditions: -adrenal gland problems -history of blood clots of the legs, lungs, or other parts of the  body -diabetes -kidney disease -liver disease -stroke -an unusual or allergic reaction to megestrol, other medicines, foods, dyes, or preservatives -pregnant or trying to get pregnant -breast-feeding How should I use this medicine? Take this medicine by mouth. Follow the directions on the prescription label. Do not take your medicine more often than directed. Take your doses at regular intervals. Do not stop taking except on the advice of your doctor or health care professional. Talk to your pediatrician regarding the use of this medicine in children. Special care may be needed. Overdosage: If you think you have taken too much of this medicine contact a poison control center or emergency room at once. NOTE: This medicine is only for you. Do not share this medicine with others. What if I miss a dose? If you miss a dose, take it as soon as you can. If it is almost time for your next dose, take only that dose. Do not take double or extra doses. What may interact with this medicine? Do not take this medicine with any of the following medications: -dofetilide This medicine may also interact with the following medications: -carbamazepine -indinavir -phenobarbital -phenytoin -primidone -rifampin -warfarin This list may not describe all possible interactions. Give your health care provider a list of all the medicines, herbs, non-prescription drugs, or dietary supplements you use. Also tell them if you smoke, drink alcohol, or use illegal drugs. Some items may interact with your medicine. What should  I watch for while using this medicine? Visit your doctor or health care professional for regular checks on your progress. Continue taking this medicine even if you feel better. It may take 2 months of regular use before you know if this medicine is working for your condition. If you are a female of child-bearing age, use an effective method of birth control while you are taking this medicine. This  medicine should not be used by females who are pregnant or breast-feeding. There is a potential for serious side effects to an unborn child or to an infant. Talk to your health care professional or pharmacist for more information. If you have diabetes, this medicine may affect blood sugar levels. Check your blood sugar and talk to your doctor or health care professional if you notice changes. What side effects may I notice from receiving this medicine? Side effects that you should report to your doctor or health care professional as soon as possible: -difficulty breathing or shortness of breath -chest pain -dizziness -fluid retention -increased blood pressure -leg pain or swelling -nausea and vomiting -skin rash or itching -weakness Side effects that usually do not require medical attention (report to your doctor or health care professional if they continue or are bothersome): -breakthrough menstrual bleeding -hot flashes or flushing -increased appetite -mood changes -sweating -weight gain This list may not describe all possible side effects. Call your doctor for medical advice about side effects. You may report side effects to FDA at 1-800-FDA-1088. Where should I keep my medicine? Keep out of the reach of children. Store at controlled room temperature between 15 and 30 degrees C (59 and 86 degrees F). Protect from heat above 40 degrees C (104 degrees F). Throw away any unused medicine after the expiration date. NOTE: This sheet is a summary. It may not cover all possible information. If you have questions about this medicine, talk to your doctor, pharmacist, or health care provider.    2016, Elsevier/Gold Standard. (2007-12-28 15:57:10)

## 2015-11-06 ENCOUNTER — Other Ambulatory Visit: Payer: Self-pay | Admitting: Hematology & Oncology

## 2015-11-24 ENCOUNTER — Ambulatory Visit: Payer: 59 | Attending: Gynecology | Admitting: Gynecology

## 2015-11-24 ENCOUNTER — Other Ambulatory Visit (HOSPITAL_BASED_OUTPATIENT_CLINIC_OR_DEPARTMENT_OTHER): Payer: 59

## 2015-11-24 ENCOUNTER — Encounter: Payer: Self-pay | Admitting: Gynecology

## 2015-11-24 VITALS — BP 90/57 | HR 74 | Temp 98.4°F | Resp 18 | Ht 66.0 in | Wt 206.5 lb

## 2015-11-24 DIAGNOSIS — I2510 Atherosclerotic heart disease of native coronary artery without angina pectoris: Secondary | ICD-10-CM | POA: Diagnosis not present

## 2015-11-24 DIAGNOSIS — Z95810 Presence of automatic (implantable) cardiac defibrillator: Secondary | ICD-10-CM | POA: Insufficient documentation

## 2015-11-24 DIAGNOSIS — Z96649 Presence of unspecified artificial hip joint: Secondary | ICD-10-CM | POA: Diagnosis not present

## 2015-11-24 DIAGNOSIS — Z79810 Long term (current) use of selective estrogen receptor modulators (SERMs): Secondary | ICD-10-CM | POA: Diagnosis not present

## 2015-11-24 DIAGNOSIS — I252 Old myocardial infarction: Secondary | ICD-10-CM | POA: Insufficient documentation

## 2015-11-24 DIAGNOSIS — Z7189 Other specified counseling: Secondary | ICD-10-CM

## 2015-11-24 DIAGNOSIS — F17210 Nicotine dependence, cigarettes, uncomplicated: Secondary | ICD-10-CM | POA: Diagnosis not present

## 2015-11-24 DIAGNOSIS — I509 Heart failure, unspecified: Secondary | ICD-10-CM | POA: Insufficient documentation

## 2015-11-24 DIAGNOSIS — C569 Malignant neoplasm of unspecified ovary: Secondary | ICD-10-CM | POA: Diagnosis not present

## 2015-11-24 DIAGNOSIS — Z8543 Personal history of malignant neoplasm of ovary: Secondary | ICD-10-CM | POA: Diagnosis present

## 2015-11-24 DIAGNOSIS — E039 Hypothyroidism, unspecified: Secondary | ICD-10-CM | POA: Diagnosis not present

## 2015-11-24 DIAGNOSIS — J45909 Unspecified asthma, uncomplicated: Secondary | ICD-10-CM | POA: Diagnosis not present

## 2015-11-24 DIAGNOSIS — C7989 Secondary malignant neoplasm of other specified sites: Secondary | ICD-10-CM | POA: Diagnosis present

## 2015-11-24 DIAGNOSIS — Z950 Presence of cardiac pacemaker: Secondary | ICD-10-CM | POA: Insufficient documentation

## 2015-11-24 DIAGNOSIS — Z9889 Other specified postprocedural states: Secondary | ICD-10-CM | POA: Diagnosis present

## 2015-11-24 DIAGNOSIS — Z7982 Long term (current) use of aspirin: Secondary | ICD-10-CM | POA: Diagnosis not present

## 2015-11-24 DIAGNOSIS — E7800 Pure hypercholesterolemia, unspecified: Secondary | ICD-10-CM | POA: Insufficient documentation

## 2015-11-24 NOTE — Progress Notes (Signed)
Follow Up Note: Gyn-Onc  [name] 55 y.o. female  CC:  Chief Complaint  Patient presents with  . 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 Good postoperative recovery.    Based on disposition conference recommendations, we will begin adjuvant therapy using tamoxifen 20 mg daily for 2 weeks followed by Megace 40 mg 3 times a day for 2 weeks. Inhibin B will be obtained today as a baseline. The patient return to see me in 3 months. Side effects of tamoxifen and Megace were discussed. She specifically warned about the possibility of DVT and the symptoms.   Interval history: Patient returns today for postoperative checkup. Overall she's doing well and has returned to work full-time.. She denies any GI or GU symptoms   HPI:  Jahara Clucas is a 55 year old female initially seen in consultation at the request of Dr. Burney Gauze regarding management of recurrent granulosa cell tumor.  She first underwent surgery in Feb 2007 for a stage I C granulosa cell tumor. She received 3 cycles of bleomycin, etoposide, and cisplatin chemotherapy as an adjunct.  Due to recurrence on CT imaging, she underwent a radical debulking of recurrent ovarian cancer including rectosigmoid resection with low rectal anastomosis, resection of right distal ureter, ureteroneocystostomy with psoas hitch, omentectomy with Dr. Fermin Schwab on 01/19/13.  Postoperatively, adjuvant therapy was recommended using carboplatin and Taxol. She received 4 cycles but did not tolerated very well and therefore discontinued. He CT scan in January 2015 showed a subcutaneous nodule in the lower abdomen and the inhibin B is a 39 units per mL.  On 07/27/2013, she underwent a wide local excision of subcutaneous tumor nodule (3 cm) with Dr. Fermin Schwab for recurrent disease.  She was placed on Femara  and has been tolerating well.  Recently, CT imaging was performed on 06/15/15 that resulted mixed treatment response in the peritoneal tumor implants, significantly decreased size of the right paracolic gutter tumor implants, interval growth of the left pelvic side wall tumor, with no new sites of metastatic disease.  She was advised to continue taking the Femara with follow up scan for Feb or March.  Inhibin B increasing with value in Feb 2017 at 112.  Follow up imaging in March 2017 resulted: Mild progression of tumor in the left pelvis with increasing sidewall and perirectal nodularity. 2. Stable minimal nodularity in the right pericolic gutter. No other peritoneal disease identified. 3. No new lesions identified.     On September 19, 2015, she underwent an Exploratory laparotomy, radical resection of recurrent granulosa cell tumor including complete dissection of the right pararectal space and excision of tumor nodules, left ureterolysis for retroperitoneal fibrosis, resection of nodule from the left obturator space, resection of nodules from the right paracolic gutter, right para-aortic lymphadenectomy by Dr. Fermin Schwab.  Her post-operative course was uneventful except for low grade temperatures on post-op day 3.  Blood cultures still pending.  Urine culture with mixed flora.  No further fevers reported during hospital stay.  Final path revealed: Final Diagnosis  A: Gutter nodule, left paracolic, removal  - Implant of granulosa cell tumor, at least 1.0 cm (see comment)  B: Lymph node, right paraaortic, removal  - No tumor identified in one lymph node (0/1)  C: Mass and right ovarian vessels, excision  - Segment of right fallopian tube with evidence of prior surgical transection, focal dilatation,  and chronic perisalpingitis  - Medium-caliber vessels with intraluminal thrombus  - No granulosa cell tumor identified  D: Pelvic side wall nodule, right, biopsy  - Two implants of granulosa cell tumor,  0.7 cm and 0.6 cm  E: Mass, left pararectal, biopsy  - Cystic implant of granulosa cell tumor, 0.8 cm  F: Mass, left, pararectal, biopsy  - Three implants of granulosa cell tumor, up to 1.2 cm  - One lymph node negative for granulosa cell tumor (0/1)  G: Nodule, left pelvis bladder, biopsy  - Minute focus of granulosa cell tumor identified at the edge of fibrovascular and adipose tissue, may represent artifact  H: Pelvic mass, deep left, excision  - Implant of granulosa cell tumor, 2.2 cm, with cystic change   Comment  The tumor implants noted in specimens A and D-H display the characteristic architectural and cytologic features of a granulosa cell tumor, including microfollicular pattern, blood-filled cysts, nuclear grooves, and Call-Exner bodies. In some sections, the tumor nodules have apparent fibrous capsules. We favor that these are reactive capsules, but cannot exclude the possibility of granulosa cell tumor involving completely-replaced lymph nodes. The tumor was 3+ progesterone receptor, 1+ estrogen receptor positive.    Patient had oncology to postoperative course. She was placed on tamoxifen 20 mg daily for 2 weeks followed by Megace 40 mg 3 times a day for 2 weeks. His is begun on 11/24/2015. At that time and baseline inhibin B was obtained.   Review of Systems 10 point Are answered review of systems is negative except as noted above.  Vitals:  Blood pressure 105/62, pulse 82, temperature 97.8 F (36.6 C), temperature source Oral, resp. rate 18, height 5\' 6"  (1.676 m), weight 210 lb 4.8 oz (95.391 kg), SpO2 100 %.  Physical Exam:  General: Well developed, well nourished female in no acute distress. Alert and oriented x 3.  Cardiovascular: Regular rate and rhythm. S1 and S2 normal.  Lungs: Clear to auscultation bilaterally. No wheezes/crackles/rhonchi noted.  Skin: No rashes or lesions present. Back: No CVA tenderness.  Abdomen: Abdomen soft, non-tender and mildly obese.  Active bowel sounds in all quadrants. Midline incisions well-healed.  Extremities: No bilateral cyanosis, edema, or clubbing.   Pelvic exam: Deferred  Current Meds:  Outpatient Encounter Prescriptions as of 11/24/2015  Medication Sig  . albuterol (PROVENTIL HFA;VENTOLIN HFA) 108 (90 BASE) MCG/ACT inhaler Inhale 2 puffs into the lungs every 4 (four) hours as needed for wheezing or shortness of breath.  Marland Kitchen aspirin 325 MG EC tablet Take 325 mg by mouth every morning. Reported on 10/20/2015  . b complex vitamins tablet Take 1 tablet by mouth daily.  . carvedilol (COREG) 12.5 MG tablet TAKE 1 TABLET (12.5 MG TOTAL) BY MOUTH 2 (TWO) TIMES DAILY WITH A MEAL.  . cholecalciferol (VITAMIN D-400) 400 UNITS TABS tablet Take 400 Units by mouth 2 (two) times daily.  . diphenhydrAMINE (BENADRYL) 25 MG tablet Take 25 mg by mouth as needed for itching or allergies. ALLERGIES  . exemestane (AROMASIN) 25 MG tablet Take 1 tablet (25 mg total) by mouth daily after breakfast.  . fish oil-omega-3 fatty acids 1000 MG capsule Take 1 g by mouth 2 (two) times daily.  . furosemide (LASIX) 20 MG tablet Take 1 tablet (20 mg total) by mouth daily.  Marland Kitchen LORazepam (ATIVAN) 0.5 MG tablet TAKE ONE TABLET BY MOUTH EVERY SIX HOURS AS NEEDED FOR NAUSEA OR VOMITING  . Multiple Vitamins-Minerals (MULTIVITAMIN PO) Take 1 tablet by mouth daily.   Marland Kitchen  ondansetron (ZOFRAN-ODT) 4 MG disintegrating tablet   . spironolactone (ALDACTONE) 25 MG tablet TAKE 1 TABLET (25 MG TOTAL) BY MOUTH 2 (TWO) TIMES DAILY.  Marland Kitchen Vitamin D, Ergocalciferol, (DRISDOL) 50000 units CAPS capsule Take 1 capsule (50,000 Units total) by mouth every 7 (seven) days.  Marland Kitchen zolpidem (AMBIEN) 5 MG tablet TAKE ONE TABLET BY MOUTH AT BEDTIME   No facility-administered encounter medications on file as of 11/24/2015.    Allergy:  Allergies  Allergen Reactions  . Prochlorperazine Edisylate Other (See Comments)    Nervous/ flutter/ shakes--compazine  . Adhesive [Tape] Other (See  Comments)    Redness/hives from adhesive tape( tolerates latex gloves); tolerates paper tape  . Prochlorperazine Other (See Comments)    Jitters, hyper    Social Hx:   Social History   Social History  . Marital Status: Divorced    Spouse Name: N/A  . Number of Children: N/A  . Years of Education: N/A   Occupational History  .      WORKS FULL TIME   Social History Main Topics  . Smoking status: Current Some Day Smoker -- 0.50 packs/day for 30 years    Types: Cigarettes    Start date: 03/28/1974  . Smokeless tobacco: Never Used     Comment:  10-27-13  still smoking daily  . Alcohol Use: No  . Drug Use: No  . Sexual Activity: Not Currently   Other Topics Concern  . Not on file   Social History Narrative   WORKS FULL TIME   SINGLE   TOBACCO USE-YES   IMPLANTATION OF DUAL-CHAMBER [employer]. [employer]UDE DEFIBRILLATOR    Past Surgical Hx:  Past Surgical History  Procedure Laterality Date  . Cardiac defibrillator placement  02/06/2012    "lead change"  . Insert / replace / remove pacemaker  10/2009    [place]  . Tonsillectomy and adenoidectomy  ~ 1967  . Appendectomy  1989  . Vaginal hysterectomy  2001  . Tubal ligation  1993  . Bilateral oophorectomy  2007  . Mitral valve replacement  2011    "pig valve"  . Cardiac valve replacement    . Transurethral resection of bladder  2009    "for bladder cancer"  . Thymus removed    . Total hip arthroplasty  05/06/2012    Procedure: TOTAL HIP ARTHROPLASTY;  Surgeon: Gearlean Alf, MD;  Location: [facility];  Service: Orthopedics;  Laterality: Right;  . Laparotomy N/A 01/19/2013    Procedure: TUMOR DEBULKING / BOWEL RESECTION/INSERTION RIGHT UTERERAL DOUBLE J STENT/OMENTECTOMY;  Surgeon: Alvino Chapel, MD;  Location: [facility];  Service: Gynecology;  Laterality: N/A;  . Cystoscopy Right 02/23/2013    Procedure: CYSTOSCOPY WITH STENT REMOVAL;  Surgeon: Alvino Chapel, MD;  Location: [facility];  Service:  Gynecology;  Laterality: Right;  . Abdominal hysterectomy N/A 07/27/2013    Procedure: TUMOR EXCISION OF ABDOMINAL MASS;  Surgeon: Alvino Chapel, MD;  Location: [facility];  Service: Gynecology;  Laterality: N/A;  . Implantable cardioverter defibrillator revision N/A 02/06/2012    Procedure: IMPLANTABLE CARDIOVERTER DEFIBRILLATOR REVISION;  Surgeon: Evans Lance, MD;  Location: [facility] CATH LAB;  Service: Cardiovascular;  Laterality: N/A;    Past Medical Hx:  Past Medical History  Diagnosis Date  . Arrhythmia     ventricular tachycardia  . Unspecified systolic heart failure   . Ventricular fibrillation ([place])   . ICD (implantable cardiac defibrillator) in place   . Pacemaker   . High cholesterol   .  CHF (congestive heart failure) ([place])   . Myocardial infarction ([place]) 2011  . Hypothyroidism   . Arthritis 02/06/2012    "everywhere"  . Bladder cancer [facility]) 2009    "injected medicine to get rid of it"  . Granulosa cell carcinoma of ovary ([place]) 2007    right; "had chemo"  . Pneumonia     as child  . Neuropathy ([facility])     FEET AND HANDS - FROM CHEMO - BUT MUCH IMPROVED  . Port-a-cath in place     RIGHT UPPER CHEST  . Pain     JOINT PAINS AND MUSCLE ACHES ALL OVER.  Marland Kitchen Automatic implantable cardioverter-defibrillator in situ     DR. Beckie Salts   . Coronary artery disease     DR. Linard Millers  . Asthma     as a child    Family Hx:  Family History  Problem Relation Age of Onset  . Heart attack Maternal Grandfather   . Heart attack Paternal Grandfather   . Stroke Maternal Grandmother   . Heart attack Brother         CLARKE-PEARSON,Caila Cirelli L, NP 11/24/2015, 11:41 AM

## 2015-11-24 NOTE — Patient Instructions (Signed)
We will call you with the results of your Inhibin B from today.  Plan to follow up in three months or sooner if needed.  Please call for any questions or concerns.

## 2015-11-28 LAB — INHIBIN B: INHIBIN B: 201.5 pg/mL — AB (ref 0.0–16.9)

## 2015-11-29 ENCOUNTER — Telehealth: Payer: Self-pay | Admitting: Gynecologic Oncology

## 2015-11-29 NOTE — Telephone Encounter (Signed)
Spoke with the patient about Dr. Mora Bellman recommendations.  Inhibin B  Results of 201.5 discussed along with Dr. C-P's recommendations for a new CT CAP.  Patient concerned about the Tamoxifen/Megace because she has started having shortness of breath, urinary issues, and has had a heart attack in 2011.  Dr. C-P notified with the response of: Sometimes megace stimulates the respiratory center and folks increase respirations.  I don't think it has anything to do with her urinarytract or MI.  I'd encourage her to stay with the current Tamoxifen/megace regimen.  Patient also wanting her cardiologist to be informed of the medication regimen.  Message sent via EPIC to Dr. Daneen Schick.    Returned call to patient informing her of Dr. C-P's recommendations to continue therapy.  Informed her that we would reach out to her once we had heard from her cardiologist.  Message also sent to Dr. Marin Olp at [facility] about Dr. Lunette Stands recommendations.

## 2015-11-30 ENCOUNTER — Other Ambulatory Visit: Payer: Self-pay | Admitting: Hematology & Oncology

## 2015-11-30 ENCOUNTER — Ambulatory Visit (INDEPENDENT_AMBULATORY_CARE_PROVIDER_SITE_OTHER): Payer: 59 | Admitting: *Deleted

## 2015-11-30 DIAGNOSIS — I4729 Other ventricular tachycardia: Secondary | ICD-10-CM

## 2015-11-30 DIAGNOSIS — I472 Ventricular tachycardia: Secondary | ICD-10-CM

## 2015-11-30 DIAGNOSIS — D3911 Neoplasm of uncertain behavior of right ovary: Secondary | ICD-10-CM

## 2015-11-30 NOTE — Progress Notes (Signed)
Remote ICD transmission.   

## 2015-12-01 ENCOUNTER — Encounter: Payer: Self-pay | Admitting: Nurse Practitioner

## 2015-12-04 ENCOUNTER — Telehealth: Payer: Self-pay | Admitting: Gynecologic Oncology

## 2015-12-04 NOTE — Telephone Encounter (Signed)
Left message for the patient about Dr. Daneen Schick, Cardiologist, stating it would be ok to proceed with the prescribed treatment recommendation of Tamoxifen and Megace.  Advised to call for any needs.  Also informed that Dr. Marin Olp is going to be arranging her scans.

## 2015-12-06 ENCOUNTER — Other Ambulatory Visit: Payer: Self-pay | Admitting: Hematology & Oncology

## 2015-12-06 ENCOUNTER — Other Ambulatory Visit (HOSPITAL_BASED_OUTPATIENT_CLINIC_OR_DEPARTMENT_OTHER): Payer: 59

## 2015-12-08 LAB — CUP PACEART REMOTE DEVICE CHECK
Implantable Lead Implant Date: 20110503
Implantable Lead Location: 753859
MDC IDC LEAD IMPLANT DT: 20130815
MDC IDC LEAD LOCATION: 753860
MDC IDC SESS DTM: 20170616133518
MDC IDC SET LEADCHNL RA PACING AMPLITUDE: 1.75 V
MDC IDC SET LEADCHNL RV PACING AMPLITUDE: 1.375
MDC IDC SET LEADCHNL RV PACING PULSEWIDTH: 0.5 ms
MDC IDC SET LEADCHNL RV SENSING SENSITIVITY: 0.5 mV
Pulse Gen Serial Number: 644997

## 2015-12-13 ENCOUNTER — Ambulatory Visit (HOSPITAL_BASED_OUTPATIENT_CLINIC_OR_DEPARTMENT_OTHER): Payer: 59

## 2015-12-13 ENCOUNTER — Encounter: Payer: Self-pay | Admitting: Cardiology

## 2015-12-13 ENCOUNTER — Other Ambulatory Visit: Payer: Self-pay | Admitting: *Deleted

## 2015-12-13 ENCOUNTER — Encounter (HOSPITAL_BASED_OUTPATIENT_CLINIC_OR_DEPARTMENT_OTHER): Payer: Self-pay

## 2015-12-13 ENCOUNTER — Ambulatory Visit (HOSPITAL_BASED_OUTPATIENT_CLINIC_OR_DEPARTMENT_OTHER)
Admission: RE | Admit: 2015-12-13 | Discharge: 2015-12-13 | Disposition: A | Discharge status: Home / Self Care | Payer: 59 | Source: Ambulatory Visit | Attending: Hematology & Oncology | Admitting: Hematology & Oncology

## 2015-12-13 DIAGNOSIS — D3911 Neoplasm of uncertain behavior of right ovary: Secondary | ICD-10-CM

## 2015-12-13 DIAGNOSIS — I700 Atherosclerosis of aorta: Secondary | ICD-10-CM | POA: Diagnosis not present

## 2015-12-13 DIAGNOSIS — I517 Cardiomegaly: Secondary | ICD-10-CM | POA: Diagnosis not present

## 2015-12-13 DIAGNOSIS — R222 Localized swelling, mass and lump, trunk: Secondary | ICD-10-CM | POA: Insufficient documentation

## 2015-12-13 DIAGNOSIS — R918 Other nonspecific abnormal finding of lung field: Secondary | ICD-10-CM | POA: Insufficient documentation

## 2015-12-13 DIAGNOSIS — D3910 Neoplasm of uncertain behavior of unspecified ovary: Secondary | ICD-10-CM

## 2015-12-13 LAB — CMP (CANCER CENTER ONLY)
ALBUMIN: 3.7 g/dL (ref 3.3–5.5)
ALK PHOS: 75 U/L (ref 26–84)
ALT(SGPT): 20 U/L (ref 10–47)
AST: 23 U/L (ref 11–38)
BILIRUBIN TOTAL: 0.7 mg/dL (ref 0.20–1.60)
BUN, Bld: 20 mg/dL (ref 7–22)
CALCIUM: 9.5 mg/dL (ref 8.0–10.3)
CO2: 26 meq/L (ref 18–33)
Chloride: 102 mEq/L (ref 98–108)
Creat: 1.4 mg/dl — ABNORMAL HIGH (ref 0.6–1.2)
Glucose, Bld: 95 mg/dL (ref 73–118)
POTASSIUM: 4 meq/L (ref 3.3–4.7)
Sodium: 136 mEq/L (ref 128–145)
Total Protein: 7.2 g/dL (ref 6.4–8.1)

## 2015-12-13 MED ORDER — IOPAMIDOL (ISOVUE-300) INJECTION 61%
80.0000 mL | Freq: Once | INTRAVENOUS | Status: AC | PRN
  Administered 2015-12-13: 80 mL via INTRAVENOUS

## 2015-12-19 ENCOUNTER — Telehealth: Payer: Self-pay | Admitting: Gynecologic Oncology

## 2015-12-19 NOTE — Telephone Encounter (Signed)
Patient notified of CT scan and Dr. Mora Bellman recommendations:   It looks like there are a few small lesions.  I think we should continue the tamoxifen and megace as planned and repeat the Inhibin B in 3 months.    Pt verbalizing understanding.  Advised to call for any questions or concerns.

## 2016-01-29 ENCOUNTER — Other Ambulatory Visit: Payer: Self-pay | Admitting: Hematology & Oncology

## 2016-02-01 ENCOUNTER — Other Ambulatory Visit: Payer: Self-pay | Admitting: Hematology & Oncology

## 2016-02-05 ENCOUNTER — Other Ambulatory Visit: Payer: Self-pay | Admitting: Hematology & Oncology

## 2016-02-08 ENCOUNTER — Other Ambulatory Visit: Payer: Self-pay | Admitting: Hematology & Oncology

## 2016-02-12 ENCOUNTER — Other Ambulatory Visit: Payer: Self-pay | Admitting: Hematology & Oncology

## 2016-02-29 ENCOUNTER — Telehealth: Payer: Self-pay | Admitting: Cardiology

## 2016-02-29 ENCOUNTER — Encounter: Payer: 59 | Admitting: *Deleted

## 2016-02-29 NOTE — Telephone Encounter (Signed)
LMOVM reminding pt to send remote transmission.   

## 2016-03-01 ENCOUNTER — Encounter: Payer: Self-pay | Admitting: Cardiology

## 2016-03-04 ENCOUNTER — Ambulatory Visit (INDEPENDENT_AMBULATORY_CARE_PROVIDER_SITE_OTHER): Payer: 59 | Admitting: *Deleted

## 2016-03-04 DIAGNOSIS — I4729 Other ventricular tachycardia: Secondary | ICD-10-CM

## 2016-03-04 DIAGNOSIS — I472 Ventricular tachycardia: Secondary | ICD-10-CM

## 2016-03-04 NOTE — Progress Notes (Signed)
Remote ICD transmission.   

## 2016-03-07 ENCOUNTER — Encounter: Payer: Self-pay | Admitting: Cardiology

## 2016-03-13 ENCOUNTER — Other Ambulatory Visit: Payer: Self-pay

## 2016-03-13 ENCOUNTER — Other Ambulatory Visit: Payer: Self-pay | Admitting: Gynecologic Oncology

## 2016-03-13 DIAGNOSIS — D3910 Neoplasm of uncertain behavior of unspecified ovary: Secondary | ICD-10-CM

## 2016-03-13 DIAGNOSIS — D3911 Neoplasm of uncertain behavior of right ovary: Secondary | ICD-10-CM

## 2016-03-13 LAB — CUP PACEART REMOTE DEVICE CHECK
Battery Remaining Percentage: 40 %
Battery Voltage: 2.92 V
Brady Statistic AP VS Percent: 1 %
Brady Statistic RA Percent Paced: 3.2 %
Brady Statistic RV Percent Paced: 1 %
HIGH POWER IMPEDANCE MEASURED VALUE: 62 Ohm
HIGH POWER IMPEDANCE MEASURED VALUE: 62 Ohm
Implantable Lead Implant Date: 20130815
Implantable Lead Location: 753860
Lead Channel Impedance Value: 360 Ohm
Lead Channel Impedance Value: 830 Ohm
Lead Channel Pacing Threshold Amplitude: 1 V
Lead Channel Pacing Threshold Pulse Width: 0.7 ms
Lead Channel Sensing Intrinsic Amplitude: 2.7 mV
Lead Channel Setting Pacing Amplitude: 2.5 V
Lead Channel Setting Pacing Pulse Width: 1.3 ms
MDC IDC LEAD IMPLANT DT: 20110503
MDC IDC LEAD LOCATION: 753859
MDC IDC MSMT BATTERY REMAINING LONGEVITY: 41 mo
MDC IDC MSMT LEADCHNL RA PACING THRESHOLD AMPLITUDE: 0.5 V
MDC IDC MSMT LEADCHNL RA PACING THRESHOLD PULSEWIDTH: 0.5 ms
MDC IDC MSMT LEADCHNL RV SENSING INTR AMPL: 11.4 mV
MDC IDC SESS DTM: 20170910181858
MDC IDC SET LEADCHNL RA PACING AMPLITUDE: 2 V
MDC IDC SET LEADCHNL RV SENSING SENSITIVITY: 0.5 mV
MDC IDC STAT BRADY AP VP PERCENT: 0 %
MDC IDC STAT BRADY AS VP PERCENT: 1 %
MDC IDC STAT BRADY AS VS PERCENT: 99 %
Pulse Gen Serial Number: 778164

## 2016-03-14 ENCOUNTER — Other Ambulatory Visit: Payer: Self-pay | Admitting: *Deleted

## 2016-03-14 DIAGNOSIS — D3910 Neoplasm of uncertain behavior of unspecified ovary: Secondary | ICD-10-CM

## 2016-03-15 ENCOUNTER — Other Ambulatory Visit (HOSPITAL_BASED_OUTPATIENT_CLINIC_OR_DEPARTMENT_OTHER): Payer: 59

## 2016-03-15 DIAGNOSIS — D3910 Neoplasm of uncertain behavior of unspecified ovary: Secondary | ICD-10-CM | POA: Diagnosis not present

## 2016-03-15 LAB — COMPREHENSIVE METABOLIC PANEL
ALBUMIN: 3.6 g/dL (ref 3.5–5.0)
ALK PHOS: 55 U/L (ref 40–150)
ALT: 23 U/L (ref 0–55)
AST: 17 U/L (ref 5–34)
Anion Gap: 10 mEq/L (ref 3–11)
BILIRUBIN TOTAL: 0.38 mg/dL (ref 0.20–1.20)
BUN: 15.9 mg/dL (ref 7.0–26.0)
CALCIUM: 9.3 mg/dL (ref 8.4–10.4)
CO2: 19 mEq/L — ABNORMAL LOW (ref 22–29)
Chloride: 111 mEq/L — ABNORMAL HIGH (ref 98–109)
Creatinine: 1.4 mg/dL — ABNORMAL HIGH (ref 0.6–1.1)
EGFR: 41 mL/min/{1.73_m2} — ABNORMAL LOW (ref >90)
GLUCOSE: 93 mg/dL (ref 70–140)
Potassium: 3.9 mEq/L (ref 3.5–5.1)
SODIUM: 140 meq/L (ref 136–145)
TOTAL PROTEIN: 6.8 g/dL (ref 6.4–8.3)

## 2016-03-15 LAB — CBC WITH DIFFERENTIAL (CANCER CENTER ONLY)
BASO#: 0 10*3/uL (ref 0.0–0.2)
BASO%: 0.3 % (ref 0.0–2.0)
EOS ABS: 0.1 10*3/uL (ref 0.0–0.5)
EOS%: 1.3 % (ref 0.0–7.0)
HEMATOCRIT: 35.7 % (ref 34.8–46.6)
HEMOGLOBIN: 12.3 g/dL (ref 11.6–15.9)
LYMPH#: 2.4 10*3/uL (ref 0.9–3.3)
LYMPH%: 22.4 % (ref 14.0–48.0)
MCH: 31.7 pg (ref 26.0–34.0)
MCHC: 34.5 g/dL (ref 32.0–36.0)
MCV: 92 fL (ref 81–101)
MONO#: 1 10*3/uL — ABNORMAL HIGH (ref 0.1–0.9)
MONO%: 9.5 % (ref 0.0–13.0)
NEUT%: 66.5 % (ref 39.6–80.0)
NEUTROS ABS: 7.1 10*3/uL — AB (ref 1.5–6.5)
Platelets: 133 10*3/uL — ABNORMAL LOW (ref 145–400)
RBC: 3.88 10*6/uL (ref 3.70–5.32)
RDW: 13.8 % (ref 11.1–15.7)
WBC: 10.7 10*3/uL — AB (ref 3.9–10.0)

## 2016-03-19 LAB — INHIBIN B: Inhibin B: 66.1 pg/mL — ABNORMAL HIGH (ref 0.0–16.9)

## 2016-03-20 ENCOUNTER — Telehealth: Payer: Self-pay | Admitting: *Deleted

## 2016-03-20 NOTE — Telephone Encounter (Addendum)
Patient aware of results  ----- Message from Volanda Napoleon, MD sent at 03/20/2016  7:25 AM EDT ----- Call - the tumor marker level is much better!!!!  Please send this result to Dr. Fermin Schwab at [facility].  Thanks!!  [name]

## 2016-03-22 ENCOUNTER — Encounter: Payer: Self-pay | Admitting: Gynecology

## 2016-03-22 ENCOUNTER — Ambulatory Visit: Payer: 59 | Attending: Gynecology | Admitting: Gynecology

## 2016-03-22 VITALS — BP 93/58 | HR 79 | Temp 99.1°F | Resp 18 | Ht 66.0 in | Wt 205.0 lb

## 2016-03-22 DIAGNOSIS — Z888 Allergy status to other drugs, medicaments and biological substances status: Secondary | ICD-10-CM | POA: Diagnosis not present

## 2016-03-22 DIAGNOSIS — E7800 Pure hypercholesterolemia, unspecified: Secondary | ICD-10-CM | POA: Diagnosis not present

## 2016-03-22 DIAGNOSIS — I2510 Atherosclerotic heart disease of native coronary artery without angina pectoris: Secondary | ICD-10-CM | POA: Insufficient documentation

## 2016-03-22 DIAGNOSIS — C569 Malignant neoplasm of unspecified ovary: Secondary | ICD-10-CM | POA: Diagnosis not present

## 2016-03-22 DIAGNOSIS — Z95810 Presence of automatic (implantable) cardiac defibrillator: Secondary | ICD-10-CM | POA: Diagnosis not present

## 2016-03-22 DIAGNOSIS — I252 Old myocardial infarction: Secondary | ICD-10-CM | POA: Insufficient documentation

## 2016-03-22 DIAGNOSIS — Z8551 Personal history of malignant neoplasm of bladder: Secondary | ICD-10-CM | POA: Diagnosis not present

## 2016-03-22 DIAGNOSIS — Z8249 Family history of ischemic heart disease and other diseases of the circulatory system: Secondary | ICD-10-CM | POA: Diagnosis not present

## 2016-03-22 DIAGNOSIS — C7989 Secondary malignant neoplasm of other specified sites: Secondary | ICD-10-CM | POA: Diagnosis not present

## 2016-03-22 DIAGNOSIS — Z9221 Personal history of antineoplastic chemotherapy: Secondary | ICD-10-CM | POA: Diagnosis not present

## 2016-03-22 DIAGNOSIS — C561 Malignant neoplasm of right ovary: Secondary | ICD-10-CM | POA: Insufficient documentation

## 2016-03-22 DIAGNOSIS — I5020 Unspecified systolic (congestive) heart failure: Secondary | ICD-10-CM | POA: Insufficient documentation

## 2016-03-22 DIAGNOSIS — F17210 Nicotine dependence, cigarettes, uncomplicated: Secondary | ICD-10-CM | POA: Insufficient documentation

## 2016-03-22 DIAGNOSIS — E039 Hypothyroidism, unspecified: Secondary | ICD-10-CM | POA: Diagnosis not present

## 2016-03-22 NOTE — Patient Instructions (Signed)
Plan to follow up in three months with an inhibin B before your appointment.  Please call for any questions or concerns.

## 2016-03-22 NOTE — Progress Notes (Signed)
Follow Up Note: Gyn-Onc  [name] 55 y.o. female  CC:  Chief Complaint  Patient presents with  . 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   Her inhibin B is fallen from 201 PG per mL to 66.1 after 3 months of adjuvant therapy. We are very happy with this response. She is tolerating the therapy well.  She will continue adjuvant therapy using tamoxifen 20 mg daily for 2 weeks followed by Megace 40 mg 3 times a day for 2 weeks. The patient return to see me in 3 months. Side effects of tamoxifen and Megace were discussed. She specifically warned about the possibility of DVT and the symptoms.   Interval history: Patient returns today  for continuing follow-up. At her last visit she was placed on a regimen of tamoxifen for 2 weeks followed by 2 weeks of Megace. She sees be tolerating the therapy well. She denies any pelvic pain pressure vaginal bleeding or discharge. Hot flushes are minimal.  HPI:  [name] is a 55 year old female initially seen in consultation at the request of Dr. Burney Gauze regarding management of recurrent granulosa cell tumor.  She first underwent surgery in Feb 2007 for a stage I C granulosa cell tumor. She received 3 cycles of bleomycin, etoposide, and cisplatin chemotherapy as an adjunct.  Due to recurrence on CT imaging, she underwent a radical debulking of recurrent ovarian cancer including rectosigmoid resection with low rectal anastomosis, resection of right distal ureter, ureteroneocystostomy with psoas hitch, omentectomy with Dr. Fermin Schwab on 01/19/13.  Postoperatively, adjuvant therapy was recommended using carboplatin and Taxol. She received 4 cycles but did not tolerated very well and therefore discontinued. He CT scan in January 2015  showed a subcutaneous nodule in the lower abdomen and the inhibin B is a 39 units per mL.  On 07/27/2013, she underwent a wide local excision of subcutaneous tumor nodule (3 cm) with Dr. Fermin Schwab for recurrent disease.  She was placed on Femara and has been tolerating well.  Recently, CT imaging was performed on 06/15/15 that resulted mixed treatment response in the peritoneal tumor implants, significantly decreased size of the right paracolic gutter tumor implants, interval growth of the left pelvic side wall tumor, with no new sites of metastatic disease.  She was advised to continue taking the Femara with follow up scan for Feb or March.  Inhibin B increasing with value in Feb 2017 at 112.  Follow up imaging in March 2017 resulted: Mild progression of tumor in the left pelvis with increasing sidewall and perirectal nodularity. 2. Stable minimal nodularity in the right pericolic gutter. No other peritoneal disease identified. 3. No new lesions identified.     On September 19, 2015, she underwent an Exploratory laparotomy, radical resection of recurrent granulosa cell tumor including complete dissection of the right pararectal space and excision of tumor nodules, left ureterolysis for retroperitoneal fibrosis, resection of nodule from the left obturator space, resection of nodules from the right paracolic gutter, right para-aortic lymphadenectomy by Dr. Fermin Schwab.  Her post-operative course was uneventful except for low grade temperatures on post-op day 3.  Blood cultures still pending.  Urine culture with mixed flora.  No further fevers reported during hospital stay.  Final path revealed: Final Diagnosis  A: Gutter nodule, left paracolic, removal  -  Implant of granulosa cell tumor, at least 1.0 cm (see comment)  B: Lymph node, right paraaortic, removal  - No tumor identified in one lymph node (0/1)  C: Mass and right ovarian vessels, excision  - Segment of right fallopian tube with evidence of  prior surgical transection, focal dilatation, and chronic perisalpingitis  - Medium-caliber vessels with intraluminal thrombus  - No granulosa cell tumor identified  D: Pelvic side wall nodule, right, biopsy  - Two implants of granulosa cell tumor, 0.7 cm and 0.6 cm  E: Mass, left pararectal, biopsy  - Cystic implant of granulosa cell tumor, 0.8 cm  F: Mass, left, pararectal, biopsy  - Three implants of granulosa cell tumor, up to 1.2 cm  - One lymph node negative for granulosa cell tumor (0/1)  G: Nodule, left pelvis bladder, biopsy  - Minute focus of granulosa cell tumor identified at the edge of fibrovascular and adipose tissue, may represent artifact  H: Pelvic mass, deep left, excision  - Implant of granulosa cell tumor, 2.2 cm, with cystic change   Comment  The tumor implants noted in specimens A and D-H display the characteristic architectural and cytologic features of a granulosa cell tumor, including microfollicular pattern, blood-filled cysts, nuclear grooves, and Call-Exner bodies. In some sections, the tumor nodules have apparent fibrous capsules. We favor that these are reactive capsules, but cannot exclude the possibility of granulosa cell tumor involving completely-replaced lymph nodes. The tumor was 3+ progesterone receptor, 1+ estrogen receptor positive.   She was placed on tamoxifen 20 mg daily for 2 weeks followed by Megace 40 mg 3 times a day for 2 weeks. This was begun on 11/24/2015. At that time and baseline inhibin B was obtained (201) after 3 months of therapy the inhibin B had fallen to 66..   Review of Systems 10 point Are answered review of systems is negative except as noted above.  Vitals:  Blood pressure 105/62, pulse 82, temperature 97.8 F (36.6 C), temperature source Oral, resp. rate 18, height 5\' 6"  (1.676 m), weight 210 lb 4.8 oz (95.391 kg), SpO2 100 %.  Physical Exam:  General: Well developed, well nourished female in no acute distress. Alert and  oriented x 3.  Cardiovascular: Regular rate and rhythm. S1 and S2 normal.  Lungs: Clear to auscultation bilaterally. No wheezes/crackles/rhonchi noted.  Skin: No rashes or lesions present. Back: No CVA tenderness.  Abdomen: Abdomen soft, non-tender and mildly obese. Active bowel sounds in all quadrants. Midline incision well-healed.  Extremities: No bilateral cyanosis, edema, or clubbing.   Pelvic exam:  EGBUS: Normal  Vagina: Normal without lesions  Cervix and uterus surgically absent. Bimanual rectovaginal exam reveal no masses induration or nodularity.  Current Meds:  Outpatient Encounter Prescriptions as of 03/22/2016  Medication Sig  . albuterol (PROVENTIL HFA;VENTOLIN HFA) 108 (90 BASE) MCG/ACT inhaler Inhale 2 puffs into the lungs every 4 (four) hours as needed for wheezing or shortness of breath.  Marland Kitchen aspirin 325 MG EC tablet Take 325 mg by mouth every morning. Reported on 10/20/2015  . b complex vitamins tablet Take 1 tablet by mouth daily.  . carvedilol (COREG) 12.5 MG tablet TAKE 1 TABLET (12.5 MG TOTAL) BY MOUTH 2 (TWO) TIMES DAILY WITH A MEAL.  . cholecalciferol (VITAMIN D-400) 400 UNITS TABS tablet Take 400 Units by mouth 2 (two) times daily.  . diphenhydrAMINE (BENADRYL) 25 MG tablet Take 25 mg by mouth as needed for itching or allergies. ALLERGIES  . fish oil-omega-3 fatty acids 1000 MG  capsule Take 1 g by mouth 2 (two) times daily.  . furosemide (LASIX) 20 MG tablet Take 1 tablet (20 mg total) by mouth daily.  Marland Kitchen LORazepam (ATIVAN) 0.5 MG tablet TAKE 1 TABLET BY MOUTH EVERY 6 HOURS AS NEEDED FOR NAUSEA AND VOMITING.  . megestrol (MEGACE) 40 MG tablet   . Multiple Vitamins-Minerals (MULTIVITAMIN PO) Take 1 tablet by mouth daily.   Marland Kitchen spironolactone (ALDACTONE) 25 MG tablet TAKE 1 TABLET (25 MG TOTAL) BY MOUTH 2 (TWO) TIMES DAILY.  . tamoxifen (NOLVADEX) 20 MG tablet   . Vitamin D, Ergocalciferol, (DRISDOL) 50000 units CAPS capsule Take 1 capsule (50,000 Units total) by  mouth every 7 (seven) days.  Marland Kitchen zolpidem (AMBIEN) 5 MG tablet TAKE 1 TABLET BY MOUTH AT BEDTIME.  . [DISCONTINUED] exemestane (AROMASIN) 25 MG tablet Take 1 tablet (25 mg total) by mouth daily after breakfast.  . [DISCONTINUED] ondansetron (ZOFRAN-ODT) 4 MG disintegrating tablet    No facility-administered encounter medications on file as of 03/22/2016.     Allergy:  Allergies  Allergen Reactions  . Prochlorperazine Edisylate Other (See Comments)    Nervous/ flutter/ shakes--compazine  . Adhesive [Tape] Other (See Comments)    Redness/hives from adhesive tape( tolerates latex gloves); tolerates paper tape  . Prochlorperazine Other (See Comments)    Jitters, hyper    Social Hx:   Social History   Social History  . Marital status: Divorced    Spouse name: N/A  . Number of children: N/A  . Years of education: N/A   Occupational History  .      WORKS FULL TIME   Social History Main Topics  . Smoking status: Current Some Day Smoker    Packs/day: 0.50    Years: 30.00    Types: Cigarettes    Start date: 03/28/1974  . Smokeless tobacco: Never Used     Comment:  10-27-13  still smoking daily  . Alcohol use No  . Drug use: No  . Sexual activity: Not Currently   Other Topics Concern  . Not on file   Social History Narrative   WORKS FULL TIME   SINGLE   TOBACCO USE-YES   IMPLANTATION OF DUAL-CHAMBER St. JUDE DEFIBRILLATOR    Past Surgical Hx:  Past Surgical History:  Procedure Laterality Date  . ABDOMINAL HYSTERECTOMY N/A 07/27/2013   Procedure: TUMOR EXCISION OF ABDOMINAL MASS;  Surgeon: Alvino Chapel, MD;  Location: [facility];  Service: Gynecology;  Laterality: N/A;  . APPENDECTOMY  1989  . BILATERAL OOPHORECTOMY  2007  . CARDIAC DEFIBRILLATOR PLACEMENT  02/06/2012   "lead change"  . CARDIAC VALVE REPLACEMENT    . CYSTOSCOPY Right 02/23/2013   Procedure: CYSTOSCOPY WITH STENT REMOVAL;  Surgeon: Alvino Chapel, MD;  Location: [facility];  Service: Gynecology;   Laterality: Right;  . IMPLANTABLE CARDIOVERTER DEFIBRILLATOR REVISION N/A 02/06/2012   Procedure: IMPLANTABLE CARDIOVERTER DEFIBRILLATOR REVISION;  Surgeon: Evans Lance, MD;  Location[facility] C[facility];  Service: Cardiovascular;  Laterality: N/A;  . INSERT / REPLACE / REMOVE PACEMAKER  10/2009   DUAL-CHAMBER St. JUDE DEFIBRILLATOR  . LAPAROTOMY N/A 01/19/2013   Procedure: TUMOR DEBULKING / BOWEL RESECTION/INSERTION RIGHT UTERERAL DOUBLE J STENT/OMENTECTOMY;  Surgeon: Alvino Chapel, MD;  Location: [facility];  Service: Gynecology;  Laterality: N/A;  . MITRAL VALVE REPLACEMENT  2011   "pig valve"  . THYMUS REMOVED    . TONSILLECTOMY AND ADENOIDECTOMY  ~ 1967  . TOTAL HIP ARTHROPLASTY  05/06/2012   Procedure: TOTAL HIP  ARTHROPLASTY;  Surgeon: Gearlean Alf, MD;  Location: [facility];  Service: Orthopedics;  Laterality: Right;  . TRANSURETHRAL RESECTION OF BLADDER  2009   "for bladder cancer"  . TUBAL LIGATION  1993  . VAGINAL HYSTERECTOMY  2001    Past Medical Hx:  Past Medical History:  Diagnosis Date  . Arrhythmia    ventricular tachycardia  . Arthritis 02/06/2012   "everywhere"  . Asthma    as a child  . Automatic implantable cardioverter-defibrillator in situ    DR. Beckie Salts   . Bladder cancer [facility]) 2009   "injected medicine to get rid of it"  . CHF (congestive heart failure) ([place])   . Coronary artery disease    DR. Linard Millers  . Granulosa cell carcinoma of ovary ([place]) 2007   right; "had chemo"  . High cholesterol   . Hypothyroidism   . ICD (implantable cardiac defibrillator) in place   . Myocardial infarction ([place]) 2011  . Neuropathy ([facility])    FEET AND HANDS - FROM CHEMO - BUT MUCH IMPROVED  . Pacemaker   . Pain    JOINT PAINS AND MUSCLE ACHES ALL OVER.  Marland Kitchen Pneumonia    as child  . Port-a-cath in place    RIGHT UPPER CHEST  . Unspecified systolic heart failure   . Ventricular fibrillation ([place])     Family Hx:  Family History  Problem Relation Age of Onset  .  Heart attack Paternal Grandfather   . Heart attack Maternal Grandfather   . Stroke Maternal Grandmother   . Heart attack Brother         Marti Sleigh, NP 03/22/2016, 11:06 AM

## 2016-04-01 ENCOUNTER — Other Ambulatory Visit: Payer: Self-pay | Admitting: Internal Medicine

## 2016-04-01 DIAGNOSIS — I472 Ventricular tachycardia: Secondary | ICD-10-CM

## 2016-04-01 DIAGNOSIS — I4729 Other ventricular tachycardia: Secondary | ICD-10-CM

## 2016-04-01 DIAGNOSIS — Z95810 Presence of automatic (implantable) cardiac defibrillator: Secondary | ICD-10-CM

## 2016-04-01 DIAGNOSIS — I5022 Chronic systolic (congestive) heart failure: Secondary | ICD-10-CM

## 2016-04-29 ENCOUNTER — Emergency Department (HOSPITAL_BASED_OUTPATIENT_CLINIC_OR_DEPARTMENT_OTHER): Payer: 59

## 2016-04-29 ENCOUNTER — Encounter (HOSPITAL_BASED_OUTPATIENT_CLINIC_OR_DEPARTMENT_OTHER): Payer: Self-pay | Admitting: Emergency Medicine

## 2016-04-29 ENCOUNTER — Emergency Department (HOSPITAL_BASED_OUTPATIENT_CLINIC_OR_DEPARTMENT_OTHER)
Admission: EM | Admit: 2016-04-29 | Discharge: 2016-04-29 | Disposition: A | Discharge status: Home / Self Care | Payer: 59 | Attending: Emergency Medicine | Admitting: Emergency Medicine

## 2016-04-29 DIAGNOSIS — N183 Chronic kidney disease, stage 3 (moderate): Secondary | ICD-10-CM | POA: Insufficient documentation

## 2016-04-29 DIAGNOSIS — Z8551 Personal history of malignant neoplasm of bladder: Secondary | ICD-10-CM | POA: Insufficient documentation

## 2016-04-29 DIAGNOSIS — F17210 Nicotine dependence, cigarettes, uncomplicated: Secondary | ICD-10-CM | POA: Insufficient documentation

## 2016-04-29 DIAGNOSIS — R0602 Shortness of breath: Secondary | ICD-10-CM | POA: Diagnosis present

## 2016-04-29 DIAGNOSIS — Z7982 Long term (current) use of aspirin: Secondary | ICD-10-CM | POA: Insufficient documentation

## 2016-04-29 DIAGNOSIS — J45909 Unspecified asthma, uncomplicated: Secondary | ICD-10-CM | POA: Insufficient documentation

## 2016-04-29 DIAGNOSIS — E039 Hypothyroidism, unspecified: Secondary | ICD-10-CM | POA: Insufficient documentation

## 2016-04-29 DIAGNOSIS — I2510 Atherosclerotic heart disease of native coronary artery without angina pectoris: Secondary | ICD-10-CM | POA: Diagnosis not present

## 2016-04-29 DIAGNOSIS — Z8543 Personal history of malignant neoplasm of ovary: Secondary | ICD-10-CM | POA: Insufficient documentation

## 2016-04-29 DIAGNOSIS — Z79899 Other long term (current) drug therapy: Secondary | ICD-10-CM | POA: Diagnosis not present

## 2016-04-29 LAB — CBC WITH DIFFERENTIAL/PLATELET
BASOS PCT: 0 %
Basophils Absolute: 0 10*3/uL (ref 0.0–0.1)
EOS ABS: 0.3 10*3/uL (ref 0.0–0.7)
Eosinophils Relative: 2 %
HEMATOCRIT: 34 % — AB (ref 36.0–46.0)
HEMOGLOBIN: 11.5 g/dL — AB (ref 12.0–15.0)
LYMPHS ABS: 1.5 10*3/uL (ref 0.7–4.0)
Lymphocytes Relative: 12 %
MCH: 32.4 pg (ref 26.0–34.0)
MCHC: 33.8 g/dL (ref 30.0–36.0)
MCV: 95.8 fL (ref 78.0–100.0)
MONO ABS: 1.2 10*3/uL — AB (ref 0.1–1.0)
MONOS PCT: 9 %
NEUTROS ABS: 9.8 10*3/uL — AB (ref 1.7–7.7)
Neutrophils Relative %: 77 %
Platelets: 132 10*3/uL — ABNORMAL LOW (ref 150–400)
RBC: 3.55 MIL/uL — ABNORMAL LOW (ref 3.87–5.11)
RDW: 12.4 % (ref 11.5–15.5)
WBC: 12.7 10*3/uL — ABNORMAL HIGH (ref 4.0–10.5)

## 2016-04-29 LAB — BASIC METABOLIC PANEL
Anion gap: 8 (ref 5–15)
BUN: 22 mg/dL — AB (ref 6–20)
CALCIUM: 8.5 mg/dL — AB (ref 8.9–10.3)
CHLORIDE: 111 mmol/L (ref 101–111)
CO2: 19 mmol/L — AB (ref 22–32)
CREATININE: 1.27 mg/dL — AB (ref 0.44–1.00)
GFR calc non Af Amer: 46 mL/min — ABNORMAL LOW (ref >60)
GFR, EST AFRICAN AMERICAN: 54 mL/min — AB (ref >60)
Glucose, Bld: 108 mg/dL — ABNORMAL HIGH (ref 65–99)
Potassium: 3.7 mmol/L (ref 3.5–5.1)
Sodium: 138 mmol/L (ref 135–145)

## 2016-04-29 LAB — TROPONIN I: Troponin I: <0.03 ng/mL (ref <0.03)

## 2016-04-29 LAB — D-DIMER, QUANTITATIVE: D-Dimer, Quant: 0.42 ug/mL-FEU (ref 0.00–0.50)

## 2016-04-29 LAB — BRAIN NATRIURETIC PEPTIDE: B NATRIURETIC PEPTIDE 5: 191.1 pg/mL — AB (ref 0.0–100.0)

## 2016-04-29 MED ORDER — ALBUTEROL SULFATE (2.5 MG/3ML) 0.083% IN NEBU
INHALATION_SOLUTION | RESPIRATORY_TRACT | Status: AC
  Administered 2016-04-29: 2.5 mg
  Filled 2016-04-29: qty 3

## 2016-04-29 MED ORDER — ALBUTEROL SULFATE HFA 108 (90 BASE) MCG/ACT IN AERS
2.0000 | INHALATION_SPRAY | Freq: Once | RESPIRATORY_TRACT | Status: AC
  Administered 2016-04-29: 2 via RESPIRATORY_TRACT
  Filled 2016-04-29: qty 6.7

## 2016-04-29 MED ORDER — IPRATROPIUM-ALBUTEROL 0.5-2.5 (3) MG/3ML IN SOLN
RESPIRATORY_TRACT | Status: AC
  Administered 2016-04-29: 3 mL
  Filled 2016-04-29: qty 3

## 2016-04-29 MED ORDER — PREDNISONE 50 MG PO TABS
60.0000 mg | ORAL_TABLET | Freq: Once | ORAL | Status: AC
  Administered 2016-04-29: 60 mg via ORAL
  Filled 2016-04-29: qty 1

## 2016-04-29 MED ORDER — BENZONATATE 100 MG PO CAPS: 200.0000 mg | ORAL_CAPSULE | Freq: Two times a day (BID) | ORAL | 0 refills | Status: DC | PRN

## 2016-04-29 MED ORDER — PREDNISONE 20 MG PO TABS: 40.0000 mg | ORAL_TABLET | Freq: Every day | ORAL | 0 refills | Status: DC

## 2016-04-29 MED ORDER — ALBUTEROL (5 MG/ML) CONTINUOUS INHALATION SOLN
10.0000 mg/h | INHALATION_SOLUTION | RESPIRATORY_TRACT | Status: DC
  Administered 2016-04-29: 10 mg/h via RESPIRATORY_TRACT
  Filled 2016-04-29: qty 20

## 2016-04-29 NOTE — ED Provider Notes (Signed)
[place] DEPT [facility] Provider Note   CSN: [id number] Arrival date & time: 04/29/16  1743  By signing my name below, I, Irene Pap, attest that this documentation has been prepared under the direction and in the presence of Harlene Ramus, [place]. Electronically Signed: Irene Pap, ED Scribe. 04/29/16. 6:46 PM.  History   Chief Complaint Chief Complaint  Patient presents with  . Shortness of Breath   The history is provided by the patient. No language interpreter was used.   HPI Comments: [name] is a 55 y.o. female with a hx of asthma, CHF, granulosa cell carcinoma of ovary, CKD, defibrillator in place, and pneumonia who presents to the Emergency Department complaining of gradually worsening SOB onset yesterday. Pt reports associated chest congestion, productive cough with yellow sputum, and wheezing. Pt says that she has been more SOB today with mild activity. Pt was admitted with similar symptoms last December. She is not sure what exactly was the cause of her symptoms. Pt has been using Dayquil at home to no relief. She denies hx of blood clots, radiation therapy, recent hospitalizations, recent surgeries, or recent medication changes. Pt denies fever, ear pain, rhinorrhea, sore throat, chest tightness, chest pain, leg swelling, abdominal pain, diarrhea, nausea, vomiting, dysuria, dizziness, or lightheadedness.  Pt is followed by oncology (Dr. Marin Olp) And notes she is currently taking oral chemotherapy drugs. Cardiologist: Dr. Daneen Schick  Pt says that she smokes < 10 cigarettes a day.   Past Medical History:  Diagnosis Date  . Arrhythmia    ventricular tachycardia  . Arthritis 02/06/2012   "everywhere"  . Asthma    as a child  . Automatic implantable cardioverter-defibrillator in situ    DR. Beckie Salts   . Bladder cancer [facility]) 2009   "injected medicine to get rid of it"  . CHF (congestive heart failure) ([place])   . Coronary artery disease    DR. Linard Millers  .  Granulosa cell carcinoma of ovary ([place]) 2007   right; "had chemo"  . High cholesterol   . Hypothyroidism   . ICD (implantable cardiac defibrillator) in place   . Myocardial infarction 2011  . Neuropathy (HCC)    FEET AND HANDS - FROM CHEMO - BUT MUCH IMPROVED  . Pacemaker   . Pain    JOINT PAINS AND MUSCLE ACHES ALL OVER.  Marland Kitchen Pneumonia    as child  . Port-a-cath in place    RIGHT UPPER CHEST  . Unspecified systolic heart failure   . Ventricular fibrillation [facility])     Patient Active Problem List   Diagnosis Date Noted  . Hypoxia 05/28/2015  . Acute bronchitis 05/28/2015  . CKD (chronic kidney disease) stage 3, GFR 30-59 ml/min 05/28/2015  . Chronic anemia 05/28/2015  . Anxiety 03/08/2014  . Granulosa cell tumor of ovary ([place]) 01/20/2013  . OA (osteoarthritis) of hip 05/06/2012  . Automatic implantable cardioverter-defibrillator in situ 11/08/2009  . 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 VENTRICULAR TACHYCARDIA 11/07/2009  . VENTRICULAR FIBRILLATION 11/07/2009  . Chronic systolic HF (heart failure) ([place]) 11/07/2009  . S/P MVR (mitral valve replacement) 11/07/2009    Past Surgical History:  Procedure Laterality Date  . ABDOMINAL HYSTERECTOMY N/A 07/27/2013   Procedure: TUMOR EXCISION OF ABDOMINAL MASS;  Surgeon: Alvino Chapel, MD;  Location: [facility] ORS;  Service: Gynecology;  Laterality: N/A;  . APPENDECTOMY  1989  . BILATERAL OOPHORECTOMY  2007  . CARDIAC DEFIBRILLATOR PLACEMENT  02/06/2012   "lead  change"  . CARDIAC VALVE REPLACEMENT    . CYSTOSCOPY Right 02/23/2013   Procedure: CYSTOSCOPY WITH STENT REMOVAL;  Surgeon: Alvino Chapel, MD;  Location: [facility] OR[facility];  Service: Gynecology;  Laterality: Right;  . IMPLANTABLE CARDIOVERTER DEFIBRILLATOR REVISION N/A 02/06/2012   Procedure: IMPLANTABLE CARDIOVERTER DEFIBRILLATOR REVISION;  Surgeon: Evans Lance, MD;  Location: [facility] CATH LAB;  Service: Cardiovascular;  Laterality: N/A;  . INSERT  / REPLACE / REMOVE PACEMAKER  10/2009   DUAL-CHAMBER [facility]. JUDE DEFIBRILLATOR  . LAPAROTOMY N/A 01/19/2013   Procedure: TUMOR DEBULKING / BOWEL RESECTION/INSERTION RIGHT UTERERAL DOUBLE J STENT/OMENTECTOMY;  Surgeon: Alvino Chapel, MD;  Location: [facility];  Service: Gynecology;  Laterality: N/A;  . MITRAL VALVE REPLACEMENT  2011   "pig valve"  . THYMUS REMOVED    . TONSILLECTOMY AND ADENOIDECTOMY  ~ 1967  . TOTAL HIP ARTHROPLASTY  05/06/2012   Procedure: TOTAL HIP ARTHROPLASTY;  Surgeon: Gearlean Alf, MD;  Location: [facility];  Service: Orthopedics;  Laterality: Right;  . TRANSURETHRAL RESECTION OF BLADDER  2009   "for bladder cancer"  . TUBAL LIGATION  1993  . VAGINAL HYSTERECTOMY  2001    OB History    No data available       Home Medications    Prior to Admission medications   Medication Sig Start Date End Date Taking? Authorizing Provider  aspirin 325 MG EC tablet Take 325 mg by mouth every morning. Reported on 10/20/2015   Yes Historical Provider, MD  b complex vitamins tablet Take 1 tablet by mouth daily.   Yes Historical Provider, MD  carvedilol (COREG) 12.5 MG tablet TAKE 1 TABLET (12.5 MG TOTAL) BY MOUTH 2 (TWO) TIMES DAILY WITH A MEAL. 05/23/15  Yes Belva Crome, MD  cholecalciferol (VITAMIN D-400) 400 UNITS TABS tablet Take 400 Units by mouth 2 (two) times daily.   Yes Historical Provider, MD  furosemide (LASIX) 20 MG tablet TAKE 1 TABLET (20 MG TOTAL) BY MOUTH DAILY. 04/01/16  Yes Evans Lance, MD  LORazepam (ATIVAN) 0.5 MG tablet TAKE 1 TABLET BY MOUTH EVERY 6 HOURS AS NEEDED FOR NAUSEA AND VOMITING. 02/09/16  Yes Volanda Napoleon, MD  spironolactone (ALDACTONE) 25 MG tablet TAKE 1 TABLET (25 MG TOTAL) BY MOUTH 2 (TWO) TIMES DAILY. 05/23/15  Yes Belva Crome, MD  Vitamin D, Ergocalciferol, (DRISDOL) 50000 units CAPS capsule Take 1 capsule (50,000 Units total) by mouth every 7 (seven) days. 08/07/15  Yes Volanda Napoleon, MD  zolpidem (AMBIEN) 5 MG tablet TAKE 1 TABLET  BY MOUTH AT BEDTIME. 02/09/16  Yes Volanda Napoleon, MD  albuterol (PROVENTIL HFA;VENTOLIN HFA) 108 (90 BASE) MCG/ACT inhaler Inhale 2 puffs into the lungs every 4 (four) hours as needed for wheezing or shortness of breath. 05/28/15   Charlesetta Shanks, MD  benzonatate (TESSALON) 100 MG capsule Take 2 capsules (200 mg total) by mouth 2 (two) times daily as needed for cough. 04/29/16   Nona Dell, PA-C  diphenhydrAMINE (BENADRYL) 25 MG tablet Take 25 mg by mouth as needed for itching or allergies. ALLERGIES    Historical Provider, MD  fish oil-omega-3 fatty acids 1000 MG capsule Take 1 g by mouth 2 (two) times daily.    Historical Provider, MD  megestrol (MEGACE) 40 MG tablet  03/14/16   Historical Provider, MD  Multiple Vitamins-Minerals (MULTIVITAMIN PO) Take 1 tablet by mouth daily.     Historical Provider, MD  predniSONE (DELTASONE) 20 MG tablet Take 2 tablets (40 mg  total) by mouth daily. 04/29/16   Nona Dell, PA-C  tamoxifen (NOLVADEX) 20 MG tablet  03/14/16   Historical Provider, MD    Family History Family History  Problem Relation Age of Onset  . Heart attack Paternal Grandfather   . Heart attack Maternal Grandfather   . Stroke Maternal Grandmother   . Heart attack Brother     Social History Social History  Substance Use Topics  . Smoking status: Current Some Day Smoker    Packs/day: 0.50    Years: 30.00    Types: Cigarettes    Start date: 03/28/1974  . Smokeless tobacco: Never Used     Comment:  10-27-13  still smoking daily  . Alcohol use No     Allergies   Prochlorperazine edisylate; Adhesive [tape]; and Prochlorperazine   Review of Systems Review of Systems  Constitutional: Negative for fever.  HENT: Positive for congestion. Negative for ear pain, rhinorrhea and sore throat.   Respiratory: Positive for cough, shortness of breath and wheezing. Negative for chest tightness.   Cardiovascular: Negative for chest pain and leg swelling.    Gastrointestinal: Negative for abdominal pain, diarrhea, nausea and vomiting.  Genitourinary: Negative for dysuria.  Neurological: Negative for dizziness and light-headedness.  All other systems reviewed and are negative.  Physical Exam Updated Vital Signs BP 103/68   Pulse 87   Temp 99 F (37.2 C) (Oral)   Resp 20   Ht 5\' 6"  (1.676 m)   Wt 93 kg   SpO2 99%   BMI 33.09 kg/m   Physical Exam  Constitutional: She is oriented to person, place, and time. She appears well-developed and well-nourished. No distress.  HENT:  Head: Normocephalic and atraumatic.  Mouth/Throat: Uvula is midline, oropharynx is clear and moist and mucous membranes are normal. No oropharyngeal exudate, posterior oropharyngeal edema, posterior oropharyngeal erythema or tonsillar abscesses. No tonsillar exudate.  Eyes: Conjunctivae and EOM are normal. Right eye exhibits no discharge. Left eye exhibits no discharge. No scleral icterus.  Neck: Normal range of motion. Neck supple.  Cardiovascular: Normal rate, regular rhythm, normal heart sounds and intact distal pulses.   Pulmonary/Chest: Effort normal. No respiratory distress. She has wheezes. She has no rales. She exhibits no tenderness.  Diffuse wheezing throughout, worse in lower lobes  Abdominal: Soft. Bowel sounds are normal. She exhibits no distension and no mass. There is no tenderness. There is no rebound and no guarding. No hernia.  Musculoskeletal: Normal range of motion. She exhibits no edema.  Lymphadenopathy:    She has no cervical adenopathy.  Neurological: She is alert and oriented to person, place, and time.  Skin: Skin is warm and dry. Capillary refill takes less than 2 seconds. She is not diaphoretic.  Nursing note and vitals reviewed.    ED Treatments / Results  DIAGNOSTIC STUDIES: Oxygen Saturation is 98% on RA, normal by my interpretation.    COORDINATION OF CARE: 6:45 PM-Discussed treatment plan which includes chest x-ray and  breathing treatment with pt at bedside and pt agreed to plan.    Labs (all labs ordered are listed, but only abnormal results are displayed) Labs Reviewed  CBC WITH DIFFERENTIAL/PLATELET - Abnormal; Notable for the following:       Result Value   WBC 12.7 (*)    RBC 3.55 (*)    Hemoglobin 11.5 (*)    HCT 34.0 (*)    Platelets 132 (*)    Neutro Abs 9.8 (*)    Monocytes Absolute 1.2 (*)  All other components within normal limits  BASIC METABOLIC PANEL - Abnormal; Notable for the following:    CO2 19 (*)    Glucose, Bld 108 (*)    BUN 22 (*)    Creatinine, Ser 1.27 (*)    Calcium 8.5 (*)    GFR calc non Af Amer 46 (*)    GFR calc Af Amer 54 (*)    All other components within normal limits  BRAIN NATRIURETIC PEPTIDE - Abnormal; Notable for the following:    B Natriuretic Peptide 191.1 (*)    All other components within normal limits  TROPONIN I  D-DIMER, QUANTITATIVE (NOT AT [facility])    EKG  EKG Interpretation  Date/Time:  Monday April 29 2016 19:03:49 EST Ventricular Rate:  86 PR Interval:    QRS Duration: 122 QT Interval:  397 QTC Calculation: 475 R Axis:   89 Text Interpretation:  Sinus rhythm Nonspecific intraventricular conduction delay Inferior infarct, age indeterminate No STEMI. Similar to prior.  Confirmed by LONG MD, JOSHUA [phone]) on 04/29/2016 7:28:50 PM       Radiology Dg Chest 2 View  Result Date: 04/29/2016 CLINICAL DATA:  Productive cough with shortness of breath EXAM: CHEST  2 VIEW COMPARISON:  05/28/2015 FINDINGS: Left-sided ICD device, similar in position. Median sternotomy and valvular prosthesis. Surgical clips in the mediastinum. No acute infiltrate or effusion. Stable cardiomediastinal silhouette with atherosclerosis. No pneumothorax. Multiple probable calcified loose bodies at the left shoulder. IMPRESSION: 1. No acute infiltrate or edema 2. Atherosclerosis of the aorta 3. Multiple calcifications around the left shoulder, probable loose bodies.  Electronically Signed   By: Donavan Foil M.D.   On: 04/29/2016 18:30    Procedures Procedures (including critical care time)  Medications Ordered in ED Medications  albuterol (PROVENTIL,VENTOLIN) solution continuous neb (0 mg/hr Nebulization Stopped 04/29/16 2000)  albuterol (PROVENTIL) (2.5 MG/3ML) 0.083% nebulizer solution (2.5 mg  Given 04/29/16 1755)  ipratropium-albuterol (DUONEB) 0.5-2.5 (3) MG/3ML nebulizer solution (3 mLs  Given 04/29/16 1755)  predniSONE (DELTASONE) tablet 60 mg (60 mg Oral Given 04/29/16 2028)  albuterol (PROVENTIL HFA;VENTOLIN HFA) 108 (90 Base) MCG/ACT inhaler 2 puff (2 puffs Inhalation Given 04/29/16 2031)     Initial Impression / Assessment and Plan / ED Course  I have reviewed the triage vital signs and the nursing notes.  Pertinent labs & imaging results that were available during my care of the patient were reviewed by me and considered in my medical decision making (see chart for details).  Clinical Course     Patient presents with worsening shortness of breath over the past 2 days with associated wheezing, cough and chest congestion. Patient reports shortness of breath is worse with exertion. Denies fever or chest pain. History of CAD, CHF, ICD and granulomatous cell tumor she is currently on oral chemotherapy. VSS. Exam revealed diffuse wheezing throughout, worse in lower lobes, no signs of respiratory distress. Remaining exam unremarkable. Patient given neb treatment.  EKG showed sinus rhythm with no acute ischemic changes. Troponin negative. Chest x-ray unremarkable. D-dimer negative. BNP 191. On reevaluation patient reports mild improvement of wheezing/shortness of breath. Given continuous neb treatment and steroids. On reevaluation patient reports significant improvement of shortness of breath. Reexamination revealed significant improvement of wheezing throughout. Suspect patient's symptoms are likely due to asthma exacerbation versus viral bronchitis. I  have a low suspicion for ACS, PE, dissection, CHF exacerbation or other acute cardiac event at this time. Plan to discharge patient home with albuterol inhaler, steroids and symptomatic  treatment. Advised patient to follow up with her oncologist within the next week. Discussed return precautions.  Final Clinical Impressions(s) / ED Diagnoses   Final diagnoses:  Shortness of breath   I personally performed the services described in this documentation, which was scribed in my presence. The recorded information has been reviewed and is accurate.   New Prescriptions New Prescriptions   BENZONATATE (TESSALON) 100 MG CAPSULE    Take 2 capsules (200 mg total) by mouth 2 (two) times daily as needed for cough.   PREDNISONE (DELTASONE) 20 MG TABLET    Take 2 tablets (40 mg total) by mouth daily.     Chesley Noon [place], [place] 04/29/16 2034    Margette Fast, MD 04/30/16 [phone]

## 2016-04-29 NOTE — ED Notes (Signed)
Patient denies pain and is resting comfortably.  

## 2016-04-29 NOTE — ED Triage Notes (Addendum)
Productive Cough and SOB since yesterday. Pt also reports wheezing. No fevers at home

## 2016-04-29 NOTE — Discharge Instructions (Signed)
Take your medications as prescribed. I also recommend using your albuterol inhaler as prescribed as needed for wheezing/shortness of breath. Continue drinking fluids at home to remain hydrated. I recommend refraining from smoking. Follow-up with your oncologist within the next 3-4 days if her symptoms have not improved. Please return to the Emergency Department if symptoms worsen or new onset of fever, lightheadedness, coughing up blood, difficulty breathing, chest pain, abdominal pain, vomiting, leg swelling.

## 2016-05-20 ENCOUNTER — Other Ambulatory Visit: Payer: Self-pay | Admitting: Hematology & Oncology

## 2016-05-26 ENCOUNTER — Other Ambulatory Visit: Payer: Self-pay | Admitting: Interventional Cardiology

## 2016-06-11 ENCOUNTER — Other Ambulatory Visit: Payer: Self-pay

## 2016-06-11 DIAGNOSIS — D3910 Neoplasm of uncertain behavior of unspecified ovary: Secondary | ICD-10-CM

## 2016-06-12 ENCOUNTER — Other Ambulatory Visit: Payer: Self-pay | Admitting: Internal Medicine

## 2016-06-12 DIAGNOSIS — Z95810 Presence of automatic (implantable) cardiac defibrillator: Secondary | ICD-10-CM

## 2016-06-12 DIAGNOSIS — I4729 Other ventricular tachycardia: Secondary | ICD-10-CM

## 2016-06-12 DIAGNOSIS — I5022 Chronic systolic (congestive) heart failure: Secondary | ICD-10-CM

## 2016-06-12 DIAGNOSIS — I472 Ventricular tachycardia: Secondary | ICD-10-CM

## 2016-06-13 ENCOUNTER — Other Ambulatory Visit: Payer: Self-pay | Admitting: Hematology & Oncology

## 2016-06-18 ENCOUNTER — Other Ambulatory Visit: Payer: Self-pay

## 2016-06-18 ENCOUNTER — Ambulatory Visit (HOSPITAL_BASED_OUTPATIENT_CLINIC_OR_DEPARTMENT_OTHER): Payer: 59

## 2016-06-18 DIAGNOSIS — C569 Malignant neoplasm of unspecified ovary: Secondary | ICD-10-CM | POA: Diagnosis not present

## 2016-06-18 DIAGNOSIS — D3910 Neoplasm of uncertain behavior of unspecified ovary: Secondary | ICD-10-CM

## 2016-06-19 ENCOUNTER — Other Ambulatory Visit: Payer: Self-pay | Admitting: Hematology & Oncology

## 2016-06-20 LAB — INHIBIN B: INHIBIN B: 81.4 pg/mL — AB (ref 0.0–16.9)

## 2016-06-21 ENCOUNTER — Ambulatory Visit: Payer: 59 | Attending: Gynecology | Admitting: Gynecology

## 2016-06-21 ENCOUNTER — Encounter: Payer: Self-pay | Admitting: Gynecology

## 2016-06-21 VITALS — BP 103/49 | HR 87 | Temp 98.6°F | Resp 18 | Ht 66.0 in | Wt 204.3 lb

## 2016-06-21 DIAGNOSIS — Z888 Allergy status to other drugs, medicaments and biological substances status: Secondary | ICD-10-CM | POA: Insufficient documentation

## 2016-06-21 DIAGNOSIS — Z170 Estrogen receptor positive status [ER+]: Secondary | ICD-10-CM | POA: Insufficient documentation

## 2016-06-21 DIAGNOSIS — F17210 Nicotine dependence, cigarettes, uncomplicated: Secondary | ICD-10-CM | POA: Insufficient documentation

## 2016-06-21 DIAGNOSIS — E039 Hypothyroidism, unspecified: Secondary | ICD-10-CM | POA: Insufficient documentation

## 2016-06-21 DIAGNOSIS — Z8551 Personal history of malignant neoplasm of bladder: Secondary | ICD-10-CM | POA: Diagnosis not present

## 2016-06-21 DIAGNOSIS — E7800 Pure hypercholesterolemia, unspecified: Secondary | ICD-10-CM | POA: Diagnosis not present

## 2016-06-21 DIAGNOSIS — Z952 Presence of prosthetic heart valve: Secondary | ICD-10-CM | POA: Diagnosis not present

## 2016-06-21 DIAGNOSIS — Z7982 Long term (current) use of aspirin: Secondary | ICD-10-CM | POA: Diagnosis not present

## 2016-06-21 DIAGNOSIS — C569 Malignant neoplasm of unspecified ovary: Secondary | ICD-10-CM | POA: Diagnosis not present

## 2016-06-21 DIAGNOSIS — Z79810 Long term (current) use of selective estrogen receptor modulators (SERMs): Secondary | ICD-10-CM | POA: Insufficient documentation

## 2016-06-21 DIAGNOSIS — I2510 Atherosclerotic heart disease of native coronary artery without angina pectoris: Secondary | ICD-10-CM | POA: Insufficient documentation

## 2016-06-21 DIAGNOSIS — Z95810 Presence of automatic (implantable) cardiac defibrillator: Secondary | ICD-10-CM | POA: Diagnosis not present

## 2016-06-21 DIAGNOSIS — Z8249 Family history of ischemic heart disease and other diseases of the circulatory system: Secondary | ICD-10-CM | POA: Insufficient documentation

## 2016-06-21 DIAGNOSIS — I509 Heart failure, unspecified: Secondary | ICD-10-CM | POA: Diagnosis not present

## 2016-06-21 DIAGNOSIS — Z9889 Other specified postprocedural states: Secondary | ICD-10-CM | POA: Diagnosis not present

## 2016-06-21 DIAGNOSIS — Z79899 Other long term (current) drug therapy: Secondary | ICD-10-CM | POA: Diagnosis not present

## 2016-06-21 DIAGNOSIS — I252 Old myocardial infarction: Secondary | ICD-10-CM | POA: Diagnosis not present

## 2016-06-21 DIAGNOSIS — J45909 Unspecified asthma, uncomplicated: Secondary | ICD-10-CM | POA: Diagnosis not present

## 2016-06-21 DIAGNOSIS — Z96641 Presence of right artificial hip joint: Secondary | ICD-10-CM | POA: Diagnosis not present

## 2016-06-21 NOTE — Progress Notes (Signed)
Follow Up Note: Gyn-Onc  [name] 55 y.o. female  CC:  Chief Complaint  Patient presents with  . 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   Her inhibin B is fallen from 201 PG per mL to 66.1 after 3 months of adjuvant therapy(the [place] lab was used for this test). Inhibin B in the [facility] lab this week was 81. It's unclear to me whether there is some laboratory variation going on here whether the patient is actually having an increase in her tumor marker. Therefore we will ask her to return in 3 months we'll repeat a another inhibin B just prior to that visit in the [facility] lab.   She will continue adjuvant therapy using tamoxifen 20 mg daily for 2 weeks followed by Megace 40 mg 3 times a day for 2 weeks.  . Side effects of tamoxifen and Megace were discussed. She specifically warned about the possibility of DVT and the symptoms.  I was just she see a hand surgeon for evaluation as she may have carpal tunnel syndrome.   Interval history: Patient returns today  for continuing follow-up. She has been on a regimen of tamoxifen for 2 weeks followed by 2 weeks of Megace. In the initial 3 months after starting that regimen she had a significant fall in her inhibin B level. She seems be tolerating the therapy well. She denies any pelvic pain pressure vaginal bleeding or discharge. Hot flushes are minimal. Her inhibin B2 days ago in the [facility] lab was 37 (previously in the [place] lab was 66)  She does complain of some numbness in her right hand.   HPI:  [name] is a 55 year old female initially seen in consultation at the request of Dr. Burney Gauze regarding management of recurrent granulosa cell tumor.  She first underwent surgery in Feb 2007 for a stage I C granulosa cell tumor. She received  3 cycles of bleomycin, etoposide, and cisplatin chemotherapy as an adjunct.  Due to recurrence on CT imaging, she underwent a radical debulking of recurrent ovarian cancer including rectosigmoid resection with low rectal anastomosis, resection of right distal ureter, ureteroneocystostomy with psoas hitch, omentectomy with Dr. Fermin Schwab on 01/19/13.  Postoperatively, adjuvant therapy was recommended using carboplatin and Taxol. She received 4 cycles but did not tolerated very well and therefore discontinued. He CT scan in January 2015 showed a subcutaneous nodule in the lower abdomen and the inhibin B is a 39 units per mL.  On 07/27/2013, she underwent a wide local excision of subcutaneous tumor nodule (3 cm) with Dr. Fermin Schwab for recurrent disease.  She was placed on Femara and has been tolerating well.  Recently, CT imaging was performed on 06/15/15 that resulted mixed treatment response in the peritoneal tumor implants, significantly decreased size of the right paracolic gutter tumor implants, interval growth of the left pelvic side wall tumor, with no new sites of metastatic disease.  She was advised to continue taking the Femara with follow up scan for Feb or March.  Inhibin B increasing with value in Feb 2017 at 112.  Follow up imaging in March 2017 resulted: Mild progression of tumor in the left pelvis with increasing sidewall and perirectal nodularity. 2. Stable minimal nodularity in the right pericolic gutter. No other peritoneal disease identified. 3. No new lesions identified.  On September 19, 2015, she underwent an Exploratory laparotomy, radical resection of recurrent granulosa cell tumor including complete dissection of the right pararectal space and excision of tumor nodules, left ureterolysis for retroperitoneal fibrosis, resection of nodule from the left obturator space, resection of nodules from the right paracolic gutter, right para-aortic lymphadenectomy by Dr. Fermin Schwab.  Her  post-operative course was uneventful except for low grade temperatures on post-op day 3.  Blood cultures still pending.  Urine culture with mixed flora.  No further fevers reported during hospital stay.  Final path revealed: Final Diagnosis  A: Gutter nodule, left paracolic, removal  - Implant of granulosa cell tumor, at least 1.0 cm (see comment)  B: Lymph node, right paraaortic, removal  - No tumor identified in one lymph node (0/1)  C: Mass and right ovarian vessels, excision  - Segment of right fallopian tube with evidence of prior surgical transection, focal dilatation, and chronic perisalpingitis  - Medium-caliber vessels with intraluminal thrombus  - No granulosa cell tumor identified  D: Pelvic side wall nodule, right, biopsy  - Two implants of granulosa cell tumor, 0.7 cm and 0.6 cm  E: Mass, left pararectal, biopsy  - Cystic implant of granulosa cell tumor, 0.8 cm  F: Mass, left, pararectal, biopsy  - Three implants of granulosa cell tumor, up to 1.2 cm  - One lymph node negative for granulosa cell tumor (0/1)  G: Nodule, left pelvis bladder, biopsy  - Minute focus of granulosa cell tumor identified at the edge of fibrovascular and adipose tissue, may represent artifact  H: Pelvic mass, deep left, excision  - Implant of granulosa cell tumor, 2.2 cm, with cystic change   Comment  The tumor implants noted in specimens A and D-H display the characteristic architectural and cytologic features of a granulosa cell tumor, including microfollicular pattern, blood-filled cysts, nuclear grooves, and Call-Exner bodies. In some sections, the tumor nodules have apparent fibrous capsules. We favor that these are reactive capsules, but cannot exclude the possibility of granulosa cell tumor involving completely-replaced lymph nodes. The tumor was 3+ progesterone receptor, 1+ estrogen receptor positive.   She was placed on tamoxifen 20 mg daily for 2 weeks followed by Megace 40 mg 3 times a day for  2 weeks. This was begun on 11/24/2015. At that time and baseline inhibin B was obtained (201) after 3 months of therapy the inhibin B had fallen to 66..   Review of Systems 10 point Are answered review of systems is negative except as noted above.  Vitals:  Blood pressure 105/62, pulse 82, temperature 97.8 F (36.6 C), temperature source Oral, resp. rate 18, height 5\' 6"  (1.676 m), weight 210 lb 4.8 oz (95.391 kg), SpO2 100 %.  Physical Exam:  General: Well developed, well nourished female in no acute distress. Alert and oriented x 3.   .  Skin: No rashes or lesions present. Back: No CVA tenderness.  Abdomen: Abdomen soft, non-tender and mildly obese. Active bowel sounds in all quadrants. Midline incision well-healed.  Extremities: No bilateral cyanosis, edema, or clubbing.   Pelvic exam:  EGBUS: Normal  Vagina: Normal without lesions  Cervix and uterus surgically absent. Bimanual rectovaginal exam reveal no masses induration or nodularity.  Current Meds:  Outpatient Encounter Prescriptions as of 06/21/2016  Medication Sig  . albuterol (PROVENTIL HFA;VENTOLIN HFA) 108 (90 BASE) MCG/ACT inhaler Inhale 2 puffs into the lungs every 4 (four) hours as needed for wheezing or shortness of breath.  Marland Kitchen aspirin 325 MG EC tablet  Take 325 mg by mouth every morning. Reported on 10/20/2015  . b complex vitamins tablet Take 1 tablet by mouth daily.  . carvedilol (COREG) 12.5 MG tablet TAKE 1 TABLET (12.5 MG TOTAL) BY MOUTH 2 (TWO) TIMES DAILY WITH A MEAL.  . cholecalciferol (VITAMIN D-400) 400 UNITS TABS tablet Take 400 Units by mouth 2 (two) times daily.  . diphenhydrAMINE (BENADRYL) 25 MG tablet Take 25 mg by mouth as needed for itching or allergies. ALLERGIES  . fish oil-omega-3 fatty acids 1000 MG capsule Take 1 g by mouth 2 (two) times daily.  . furosemide (LASIX) 20 MG tablet TAKE 1 TABLET (20 MG TOTAL) BY MOUTH DAILY.  Marland Kitchen LORazepam (ATIVAN) 0.5 MG tablet TAKE 1 TABLET BY MOUTH EVERY 6  HOURS AS NEEDED FOR NAUSEA AND VOMITING  . megestrol (MEGACE) 40 MG tablet   . Multiple Vitamins-Minerals (MULTIVITAMIN PO) Take 1 tablet by mouth daily.   . predniSONE (DELTASONE) 20 MG tablet Take 2 tablets (40 mg total) by mouth daily.  Marland Kitchen spironolactone (ALDACTONE) 25 MG tablet TAKE 1 TABLET (25 MG TOTAL) BY MOUTH 2 (TWO) TIMES DAILY.  . tamoxifen (NOLVADEX) 20 MG tablet   . Vitamin D, Ergocalciferol, (DRISDOL) 50000 units CAPS capsule Take 1 capsule (50,000 Units total) by mouth every 7 (seven) days.  Marland Kitchen zolpidem (AMBIEN) 5 MG tablet TAKE 1 TABLET BY MOUTH AT BEDTIME  . [DISCONTINUED] benzonatate (TESSALON) 100 MG capsule Take 2 capsules (200 mg total) by mouth 2 (two) times daily as needed for cough. (Patient not taking: Reported on 06/21/2016)   No facility-administered encounter medications on file as of 06/21/2016.     Allergy:  Allergies  Allergen Reactions  . Prochlorperazine Edisylate Other (See Comments)    Nervous/ flutter/ shakes--compazine  . Adhesive [Tape] Other (See Comments)    Redness/hives from adhesive tape( tolerates latex gloves); tolerates paper tape  . Prochlorperazine Other (See Comments)    Jitters, hyper    Social Hx:   Social History   Social History  . Marital status: Divorced    Spouse name: N/A  . Number of children: N/A  . Years of education: N/A   Occupational History  .      WORKS FULL TIME   Social History Main Topics  . Smoking status: Current Some Day Smoker    Packs/day: 0.50    Years: 30.00    Types: Cigarettes    Start date: 03/28/1974  . Smokeless tobacco: Never Used     Comment:  10-27-13  still smoking daily  . Alcohol use No  . Drug use: No  . Sexual activity: Not Currently   Other Topics Concern  . Not on file   Social History Narrative   WORKS FULL TIME   SINGLE   TOBACCO USE-YES   IMPLANTATION OF DUAL-CHAMBER St. JUDE DEFIBRILLATOR    Past Surgical Hx:  Past Surgical History:  Procedure Laterality Date  .  ABDOMINAL HYSTERECTOMY N/A 07/27/2013   Procedure: TUMOR EXCISION OF ABDOMINAL MASS;  Surgeon: Alvino Chapel, MD;  Location: [facility];  Service: Gynecology;  Laterality: N/A;  . APPENDECTOMY  1989  . BILATERAL OOPHORECTOMY  2007  . CARDIAC DEFIBRILLATOR PLACEMENT  02/06/2012   "lead change"  . CARDIAC VALVE REPLACEMENT    . CYSTOSCOPY Right 02/23/2013   Procedure: CYSTOSCOPY WITH STENT REMOVAL;  Surgeon: Alvino Chapel, MD;  Location: [facility];  Service: Gynecology;  Laterality: Right;  . IMPLANTABLE CARDIOVERTER DEFIBRILLATOR REVISION N/A 02/06/2012   Procedure: IMPLANTABLE CARDIOVERTER  DEFIBRILLATOR REVISION;  Surgeon: Evans Lance, MD;  Location: [facility] CATH LAB;  Service: Cardiovascular;  Laterality: N/A;  . INSERT / REPLACE / REMOVE PACEMAKER  10/2009   DUAL-CHAMBER St. JUDE DEFIBRILLATOR  . LAPAROTOMY N/A 01/19/2013   Procedure: TUMOR DEBULKING / BOWEL RESECTION/INSERTION RIGHT UTERERAL DOUBLE J STENT/OMENTECTOMY;  Surgeon: Alvino Chapel, MD;  Location: [facility];  Service: Gynecology;  Laterality: N/A;  . MITRAL VALVE REPLACEMENT  2011   "pig valve"  . THYMUS REMOVED    . TONSILLECTOMY AND ADENOIDECTOMY  ~ 1967  . TOTAL HIP ARTHROPLASTY  05/06/2012   Procedure: TOTAL HIP ARTHROPLASTY;  Surgeon: Gearlean Alf, MD;  Location: [facility];  Service: Orthopedics;  Laterality: Right;  . TRANSURETHRAL RESECTION OF BLADDER  2009   "for bladder cancer"  . TUBAL LIGATION  1993  . VAGINAL HYSTERECTOMY  2001    Past Medical Hx:  Past Medical History:  Diagnosis Date  . Arrhythmia    ventricular tachycardia  . Arthritis 02/06/2012   "everywhere"  . Asthma    as a child  . Automatic implantable cardioverter-defibrillator in situ    DR. Beckie Salts   . Bladder cancer [facility]) 2009   "injected medicine to get rid of it"  . CHF (congestive heart failure) ([place])   . Coronary artery disease    DR. Linard Millers  . Granulosa cell carcinoma of ovary ([place]) 2007   right; "had chemo"  .  High cholesterol   . Hypothyroidism   . ICD (implantable cardiac defibrillator) in place   . Myocardial infarction 2011  . Neuropathy (HCC)    FEET AND HANDS - FROM CHEMO - BUT MUCH IMPROVED  . Pacemaker   . Pain    JOINT PAINS AND MUSCLE ACHES ALL OVER.  Marland Kitchen Pneumonia    as child  . Port-a-cath in place    RIGHT UPPER CHEST  . Unspecified systolic heart failure   . Ventricular fibrillation ([place])     Family Hx:  Family History  Problem Relation Age of Onset  . Heart attack Paternal Grandfather   . Heart attack Maternal Grandfather   . Stroke Maternal Grandmother   . Heart attack Brother         Marti Sleigh, NP 06/21/2016, 11:26 AM

## 2016-06-21 NOTE — Patient Instructions (Signed)
Plan to follow up with Dr. Fermin Schwab in three months with an Inhibin B lab draw the Monday before.  Please call for any questions or concerns.

## 2016-06-26 ENCOUNTER — Other Ambulatory Visit: Payer: Self-pay | Admitting: Interventional Cardiology

## 2016-08-12 ENCOUNTER — Other Ambulatory Visit: Payer: Self-pay | Admitting: Hematology & Oncology

## 2016-08-12 DIAGNOSIS — M2550 Pain in unspecified joint: Secondary | ICD-10-CM

## 2016-08-13 ENCOUNTER — Other Ambulatory Visit: Payer: Self-pay

## 2016-08-13 DIAGNOSIS — I4729 Other ventricular tachycardia: Secondary | ICD-10-CM

## 2016-08-13 DIAGNOSIS — I472 Ventricular tachycardia: Secondary | ICD-10-CM

## 2016-08-13 DIAGNOSIS — Z95810 Presence of automatic (implantable) cardiac defibrillator: Secondary | ICD-10-CM

## 2016-08-13 DIAGNOSIS — I5022 Chronic systolic (congestive) heart failure: Secondary | ICD-10-CM

## 2016-08-13 MED ORDER — SPIRONOLACTONE 25 MG PO TABS: 25.0000 mg | ORAL_TABLET | Freq: Two times a day (BID) | ORAL | 0 refills | Status: DC

## 2016-08-13 MED ORDER — FUROSEMIDE 20 MG PO TABS: 20.0000 mg | ORAL_TABLET | Freq: Every day | ORAL | 0 refills | Status: DC

## 2016-08-13 MED ORDER — CARVEDILOL 12.5 MG PO TABS: ORAL_TABLET | ORAL | 0 refills | Status: DC

## 2016-08-19 ENCOUNTER — Other Ambulatory Visit: Payer: Self-pay | Admitting: Hematology & Oncology

## 2016-08-27 ENCOUNTER — Encounter: Payer: Self-pay | Admitting: Internal Medicine

## 2016-08-27 ENCOUNTER — Ambulatory Visit (INDEPENDENT_AMBULATORY_CARE_PROVIDER_SITE_OTHER): Payer: 59 | Admitting: Internal Medicine

## 2016-08-27 DIAGNOSIS — I5022 Chronic systolic (congestive) heart failure: Secondary | ICD-10-CM

## 2016-08-27 DIAGNOSIS — Z95810 Presence of automatic (implantable) cardiac defibrillator: Secondary | ICD-10-CM

## 2016-08-27 DIAGNOSIS — I4729 Other ventricular tachycardia: Secondary | ICD-10-CM

## 2016-08-27 DIAGNOSIS — I472 Ventricular tachycardia: Secondary | ICD-10-CM

## 2016-08-27 LAB — CUP PACEART INCLINIC DEVICE CHECK
Brady Statistic RA Percent Paced: 2.9 %
HighPow Impedance: 66.375
Implantable Lead Implant Date: 20110503
Implantable Lead Location: 753860
Implantable Pulse Generator Implant Date: 20110503
Lead Channel Impedance Value: 425 Ohm
Lead Channel Pacing Threshold Amplitude: 0.5 V
Lead Channel Pacing Threshold Amplitude: 1.25 V
Lead Channel Pacing Threshold Amplitude: 1.25 V
Lead Channel Pacing Threshold Pulse Width: 0.5 ms
Lead Channel Pacing Threshold Pulse Width: 1.3 ms
Lead Channel Sensing Intrinsic Amplitude: 3.1 mV
Lead Channel Setting Pacing Amplitude: 2.5 V
MDC IDC LEAD IMPLANT DT: 20130815
MDC IDC LEAD LOCATION: 753859
MDC IDC MSMT LEADCHNL RA PACING THRESHOLD AMPLITUDE: 0.5 V
MDC IDC MSMT LEADCHNL RA PACING THRESHOLD PULSEWIDTH: 0.5 ms
MDC IDC MSMT LEADCHNL RV IMPEDANCE VALUE: 425 Ohm
MDC IDC MSMT LEADCHNL RV PACING THRESHOLD PULSEWIDTH: 1.3 ms
MDC IDC MSMT LEADCHNL RV SENSING INTR AMPL: 11.4 mV
MDC IDC PG SERIAL: 778164
MDC IDC SESS DTM: 20180306145543
MDC IDC SET LEADCHNL RA PACING AMPLITUDE: 2 V
MDC IDC SET LEADCHNL RV PACING PULSEWIDTH: 1.3 ms
MDC IDC SET LEADCHNL RV SENSING SENSITIVITY: 0.5 mV
MDC IDC STAT BRADY RV PERCENT PACED: 0.16 %

## 2016-08-27 MED ORDER — FUROSEMIDE 20 MG PO TABS: 20.0000 mg | ORAL_TABLET | Freq: Every day | ORAL | 3 refills | Status: DC

## 2016-08-27 NOTE — Patient Instructions (Addendum)
Medication Instructions:  Your physician has recommended you make the following change in your medication:  1) May take an extra Lasix tablet (20 mg) as needed for leg swelling or shortness of breath.  Labwork: None Ordered   Testing/Procedures: None Ordered   Follow-Up: Your physician wants you to follow-up in: 1 year with Dr. Lovena Le. You will receive a reminder letter in the mail two months in advance. If you don't receive a letter, please call our office to schedule the follow-up appointment.  Remote monitoring is used to monitor your Pacemaker of ICD from home. This monitoring reduces the number of office visits required to check your device to one time per year. It allows [place] to keep an eye on the functioning of your device to ensure it is working properly. You are scheduled for a device check from home on 02/25/17. You may send your transmission at any time that day. If you have a wireless device, the transmission will be sent automatically. After your physician reviews your transmission, you will receive a postcard with your next transmission date.    Any Other Special Instructions Will Be Listed Below (If Applicable).     If you need a refill on your cardiac medications before your next appointment, please call your pharmacy.

## 2016-08-27 NOTE — Progress Notes (Signed)
HPI [name] returns today for followup. She is a very pleasant 56 year old woman with a nonischemic cardiomyopathy, chronic systolic heart failure class 2, status post ICD implantation. In the interim, she has had no edema. No ICD therapies. She has a recall St. Jude device which has been working normally. She has had a slightly reduced pacing impedence in her atrial lead. No palpitations. She is getting chemo therapy and has had improvement in her biomarkers. Allergies  Allergen Reactions  . Prochlorperazine Edisylate Other (See Comments)    Nervous/ flutter/ shakes--compazine  . Adhesive [Tape] Other (See Comments)    Redness/skin peeling Redness/hives from adhesive tape( tolerates latex gloves); tolerates paper tape  . Prochlorperazine Other (See Comments)    Jitters, hyper     Current Outpatient Prescriptions  Medication Sig Dispense Refill  . albuterol (PROVENTIL HFA;VENTOLIN HFA) 108 (90 BASE) MCG/ACT inhaler Inhale 2 puffs into the lungs every 4 (four) hours as needed for wheezing or shortness of breath. 1 Inhaler 1  . aspirin 325 MG EC tablet Take 325 mg by mouth every morning. Reported on 10/20/2015    . b complex vitamins tablet Take 1 tablet by mouth daily.    . carvedilol (COREG) 12.5 MG tablet TAKE 1 TABLET BY MOUTH 2 TIMES DAILY WITH A MEAL. 90 tablet 0  . cholecalciferol (VITAMIN D-400) 400 UNITS TABS tablet Take 400 Units by mouth 2 (two) times daily.    . diphenhydrAMINE (BENADRYL) 25 MG tablet Take 25 mg by mouth as needed for itching or allergies. ALLERGIES    . fish oil-omega-3 fatty acids 1000 MG capsule Take 1 g by mouth 2 (two) times daily.    . furosemide (LASIX) 20 MG tablet Take 1 tablet (20 mg total) by mouth daily. 90 tablet 0  . LORazepam (ATIVAN) 0.5 MG tablet TAKE 1 TABLET BY MOUTH EVERY 6 HOURS AS NEEDED FOR NAUSEA AND VOMITING 60 tablet 2  . megestrol (MEGACE) 40 MG tablet     . Multiple Vitamins-Minerals (MULTIVITAMIN PO) Take 1 tablet by mouth daily.      Marland Kitchen spironolactone (ALDACTONE) 25 MG tablet Take 1 tablet (25 mg total) by mouth 2 (two) times daily. 180 tablet 0  . tamoxifen (NOLVADEX) 20 MG tablet     . Vitamin D, Ergocalciferol, (DRISDOL) 50000 units CAPS capsule TAKE 1 CAPSULE (50,000 UNITS TOTAL) BY MOUTH EVERY 7 (SEVEN) DAYS. 24 capsule 2  . zolpidem (AMBIEN) 5 MG tablet TAKE 1 TABLET BY MOUTH AT BEDTIME 30 tablet 2   No current facility-administered medications for this visit.      Past Medical History:  Diagnosis Date  . Arrhythmia    ventricular tachycardia  . Arthritis 02/06/2012   "everywhere"  . Asthma    as a child  . Automatic implantable cardioverter-defibrillator in situ    DR. Beckie Salts   . Bladder cancer [name]) 2009   "injected medicine to get rid of it"  . CHF (congestive heart failure) ([place])   . Coronary artery disease    DR. Linard Millers  . Granulosa cell carcinoma of ovary ([place]) 2007   right; "had chemo"  . High cholesterol   . Hypothyroidism   . ICD (implantable cardiac defibrillator) in place   . Myocardial infarction 2011  . Neuropathy (HCC)    FEET AND HANDS - FROM CHEMO - BUT MUCH IMPROVED  . Pacemaker   . Pain    JOINT PAINS AND MUSCLE ACHES ALL OVER.  Marland Kitchen Pneumonia    as child  .  Port-a-cath in place    RIGHT UPPER CHEST  . Unspecified systolic heart failure   . Ventricular fibrillation ([place])     ROS:   All systems reviewed and negative except as noted in the HPI.   Past Surgical History:  Procedure Laterality Date  . ABDOMINAL HYSTERECTOMY N/A 07/27/2013   Procedure: TUMOR EXCISION OF ABDOMINAL MASS;  Surgeon: Alvino Chapel, MD;  [facility]: [facility];  Service: Gynecology;  Laterality: N/A;  . APPENDECTOMY  1989  . BILATERAL OOPHORECTOMY  2007  . CARDIAC DEFIBRILLATOR PLACEMENT  02/06/2012   "lead change"  . CARDIAC VALVE REPLACEMENT    . CYSTOSCOPY Right 02/23/2013   Procedure: CYSTOSCOPY WITH STENT REMOVAL;  Surgeon: Alvino Chapel, MD;  Location: [facility];  Service:  Gynecology;  Laterality: Right;  . IMPLANTABLE CARDIOVERTER DEFIBRILLATOR REVISION N/A 02/06/2012   Procedure: IMPLANTABLE CARDIOVERTER DEFIBRILLATOR REVISION;  Surgeon: Evans Lance, MD;  Location: [facility] CATH LAB;  Service: Cardiovascular;  Laterality: N/A;  . INSERT / REPLACE / REMOVE PACEMAKER  10/2009   DUAL-CHAMBER St. JUDE DEFIBRILLATOR  . LAPAROTOMY N/A 01/19/2013   Procedure: TUMOR DEBULKING / BOWEL RESECTION/INSERTION RIGHT UTERERAL DOUBLE J STENT/OMENTECTOMY;  Surgeon: Alvino Chapel, MD;  Location: [facility];  Service: Gynecology;  Laterality: N/A;  . MITRAL VALVE REPLACEMENT  2011   "pig valve"  . THYMUS REMOVED    . TONSILLECTOMY AND ADENOIDECTOMY  ~ 1967  . TOTAL HIP ARTHROPLASTY  05/06/2012   Procedure: TOTAL HIP ARTHROPLASTY;  Surgeon: Gearlean Alf, MD;  Location: [facility];  Service: Orthopedics;  Laterality: Right;  . TRANSURETHRAL RESECTION OF BLADDER  2009   "for bladder cancer"  . TUBAL LIGATION  1993  . VAGINAL HYSTERECTOMY  2001     Family History  Problem Relation Age of Onset  . Heart attack Paternal Grandfather   . Heart attack Maternal Grandfather   . Stroke Maternal Grandmother   . Heart attack Brother      Social History   Social History  . Marital status: Divorced    Spouse name: N/A  . Number of children: N/A  . Years of education: N/A   Occupational History  .      WORKS FULL TIME   Social History Main Topics  . Smoking status: Current Some Day Smoker    Packs/day: 0.50    Years: 30.00    Types: Cigarettes    Start date: 03/28/1974  . Smokeless tobacco: Never Used     Comment:  10-27-13  still smoking daily  . Alcohol use No  . Drug use: No  . Sexual activity: Not Currently   Other Topics Concern  . Not on file   Social History Narrative   WORKS FULL TIME   SINGLE   TOBACCO USE-YES   IMPLANTATION OF DUAL-CHAMBER St. JUDE DEFIBRILLATOR     BP 136/78   Pulse 89   Resp (!) 98   Ht 5\' 6"  (1.676 m)   Wt 206 lb 2 oz (93.5  kg)   BMI 33.27 kg/m   Physical Exam:  Well appearing middle-age woman, NAD HEENT: Unremarkable Neck:  6 cm JVD, no thyromegally Back:  No CVA tenderness Lungs:  Clear with no wheezes, rales, or rhonchi.  HEART:  Regular rate rhythm, no murmurs, no rubs, no clicks Abd:  soft, positive bowel sounds, no organomegally, no rebound, no guarding Ext:  2 plus pulses, no edema, no cyanosis, no clubbing Skin:  No rashes no nodules Neuro:  CN II  through XII intact, motor grossly intact  DEVICE  Normal device function.  See PaceArt for details.  about 3 years on battery  Assess/Plan: 1. Chronic systolic heart failure - her symptoms are class 2. Will continue her current meds 2. ICD - her St. Jude recall device is working normally. Will recheck in several months. 3. PAF - she has a few minutes of atrial fib on her device interogation. We discussed the possibility of systemic anti-coagulation if her atrial fib burden increases. 4. VT - she has had no symptoms and no shocks. No change in her meds.  Mikle Bosworth.D.

## 2016-09-02 ENCOUNTER — Ambulatory Visit (INDEPENDENT_AMBULATORY_CARE_PROVIDER_SITE_OTHER): Payer: 59 | Admitting: Interventional Cardiology

## 2016-09-02 ENCOUNTER — Encounter: Payer: Self-pay | Admitting: Interventional Cardiology

## 2016-09-02 VITALS — BP 116/64 | HR 98 | Ht 66.0 in | Wt 206.1 lb

## 2016-09-02 DIAGNOSIS — I480 Paroxysmal atrial fibrillation: Secondary | ICD-10-CM | POA: Insufficient documentation

## 2016-09-02 DIAGNOSIS — Z95810 Presence of automatic (implantable) cardiac defibrillator: Secondary | ICD-10-CM

## 2016-09-02 DIAGNOSIS — Z952 Presence of prosthetic heart valve: Secondary | ICD-10-CM

## 2016-09-02 DIAGNOSIS — I5022 Chronic systolic (congestive) heart failure: Secondary | ICD-10-CM

## 2016-09-02 DIAGNOSIS — N183 Chronic kidney disease, stage 3 unspecified: Secondary | ICD-10-CM

## 2016-09-02 DIAGNOSIS — I25718 Atherosclerosis of autologous vein coronary artery bypass graft(s) with other forms of angina pectoris: Secondary | ICD-10-CM | POA: Diagnosis not present

## 2016-09-02 DIAGNOSIS — J449 Chronic obstructive pulmonary disease, unspecified: Secondary | ICD-10-CM | POA: Insufficient documentation

## 2016-09-02 DIAGNOSIS — J410 Simple chronic bronchitis: Secondary | ICD-10-CM | POA: Diagnosis not present

## 2016-09-02 MED ORDER — ALBUTEROL SULFATE HFA 108 (90 BASE) MCG/ACT IN AERS: 2.0000 | INHALATION_SPRAY | RESPIRATORY_TRACT | 0 refills | Status: DC | PRN

## 2016-09-02 NOTE — Patient Instructions (Signed)

## 2016-09-02 NOTE — Progress Notes (Signed)
Cardiology Office Note    Date:  09/02/2016   ID:  [name], [place] 06/14/1961, MRN [id number]  PCP:  No PCP Per Patient  Cardiologist: Sinclair Grooms, MD   Chief Complaint  Patient presents with  . Congestive Heart Failure    History of Present Illness:  [name] is a 56 y.o. female with prior history of large right coronary/right ventricular/inferior wall infarction with aneurysm and papillary muscle rupture requiring mitral valve replacement with tissue prosthesis, ventricular tachycardia, AICD, ischemic cardiomyopathy, chronic systolic heart failure class 2, status post ICD implantation and ovarian cancer.  Overall, [name] is doing relatively well. She is still undergoing chronic chemotherapy for ovarian cancer. Other problems outlined above. She recently saw Dr. Lovena Le who discovered a very 15% atrial tach/ atrial fibrillation-noise, on device interrogation. Anticoagulation was not recommended.  She denies angina, dyspnea, orthopnea, PND, lower extremity swelling, AICD discharge, is in neurological symptoms.   Past Medical History:  Diagnosis Date  . Arrhythmia    ventricular tachycardia  . Arthritis 02/06/2012   "everywhere"  . Asthma    as a child  . Automatic implantable cardioverter-defibrillator in situ    DR. Beckie Salts   . Bladder cancer [facility]) 2009   "injected medicine to get rid of it"  . CHF (congestive heart failure) ([place])   . Coronary artery disease    DR. Linard Millers  . 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 High cholesterol   . Hypothyroidism   . ICD (implantable cardiac defibrillator) in place   . Myocardial infarction 2011  . Neuropathy (HCC)    FEET AND HANDS - FROM CHEMO - BUT MUCH IMPROVED  . Pacemaker   . Pain    JOINT PAINS AND MUSCLE ACHES ALL OVER.  Marland Kitchen Pneumonia    as child  . Port-a-cath in place    RIGHT UPPER CHEST  . Unspecified systolic heart failure   . Ventricular fibrillation [facility])      Past Surgical History:  Procedure Laterality Date  . ABDOMINAL HYSTERECTOMY N/A 07/27/2013   Procedure: TUMOR EXCISION OF ABDOMINAL MASS;  Surgeon: Alvino Chapel, MD;  [facility];  Service: Gynecology;  Laterality: N/A;  . APPENDECTOMY  1989  . BILATERAL OOPHORECTOMY  2007  . CARDIAC DEFIBRILLATOR PLACEMENT  02/06/2012   "lead change"  . CARDIAC VALVE REPLACEMENT    . CYSTOSCOPY Right 02/23/2013   Procedure: CYSTOSCOPY WITH STENT REMOVAL;  Surgeon: Alvino Chapel, MD;  [facility];  Service: Gynecology;  Laterality: Right;  . IMPLANTABLE CARDIOVERTER DEFIBRILLATOR REVISION N/A 02/06/2012   Procedure: IMPLANTABLE CARDIOVERTER DEFIBRILLATOR REVISION;  Surgeon: Evans Lance, MD;  Location: [facility] CATH LAB;  Service: Cardiovascular;  Laterality: N/A;  . INSERT / REPLACE / REMOVE PACEMAKER  10/2009   DUAL-CHAMBER St. JUDE DEFIBRILLATOR  . LAPAROTOMY N/A 01/19/2013   Procedure: TUMOR DEBULKING / BOWEL RESECTION/INSERTION RIGHT UTERERAL DOUBLE J STENT/OMENTECTOMY;  Surgeon: Alvino Chapel, MD;  [facility];  Service: Gynecology;  Laterality: N/A;  . MITRAL VALVE REPLACEMENT  2011   "pig valve"  . THYMUS REMOVED    . TONSILLECTOMY AND ADENOIDECTOMY  ~ 1967  . TOTAL HIP ARTHROPLASTY  05/06/2012   Procedure: TOTAL HIP ARTHROPLASTY;  Surgeon: Gearlean Alf, MD;  [facility];  Service: Orthopedics;  Laterality: Right;  . TRANSURETHRAL RESECTION OF BLADDER  2009   "for bladder cancer"  . TUBAL LIGATION  1993  .  VAGINAL HYSTERECTOMY  2001    Current Medications: Outpatient Medications Prior to Visit  Medication Sig Dispense Refill  . aspirin 325 MG EC tablet Take 325 mg by mouth every morning. Reported on 10/20/2015    . b complex vitamins tablet Take 1 tablet by mouth daily.    . carvedilol (COREG) 12.5 MG tablet TAKE 1 TABLET BY MOUTH 2 TIMES DAILY WITH A MEAL. 90 tablet 0  . cholecalciferol (VITAMIN D-400) 400 UNITS TABS tablet Take 400 Units by  mouth 2 (two) times daily.    . diphenhydrAMINE (BENADRYL) 25 MG tablet Take 25 mg by mouth as needed for itching or allergies. ALLERGIES    . fish oil-omega-3 fatty acids 1000 MG capsule Take 1 g by mouth 2 (two) times daily.    . furosemide (LASIX) 20 MG tablet Take 1 tablet (20 mg total) by mouth daily. May take an extra tablet (20 mg) as needed for leg swelling or shortness of breath. 135 tablet 3  . LORazepam (ATIVAN) 0.5 MG tablet TAKE 1 TABLET BY MOUTH EVERY 6 HOURS AS NEEDED FOR NAUSEA AND VOMITING 60 tablet 2  . megestrol (MEGACE) 40 MG tablet     . Multiple Vitamins-Minerals (MULTIVITAMIN PO) Take 1 tablet by mouth daily.     Marland Kitchen spironolactone (ALDACTONE) 25 MG tablet Take 1 tablet (25 mg total) by mouth 2 (two) times daily. 180 tablet 0  . tamoxifen (NOLVADEX) 20 MG tablet     . Vitamin D, Ergocalciferol, (DRISDOL) 50000 units CAPS capsule TAKE 1 CAPSULE (50,000 UNITS TOTAL) BY MOUTH EVERY 7 (SEVEN) DAYS. 24 capsule 2  . zolpidem (AMBIEN) 5 MG tablet TAKE 1 TABLET BY MOUTH AT BEDTIME 30 tablet 2  . albuterol (PROVENTIL HFA;VENTOLIN HFA) 108 (90 BASE) MCG/ACT inhaler Inhale 2 puffs into the lungs every 4 (four) hours as needed for wheezing or shortness of breath. 1 Inhaler 1   No facility-administered medications prior to visit.      Allergies:   Prochlorperazine edisylate; Adhesive [tape]; and Prochlorperazine   Social History   Social History  . Marital status: Divorced    Spouse name: N/A  . Number of children: N/A  . Years of education: N/A   Occupational History  .      WORKS FULL TIME   Social History Main Topics  . Smoking status: Current Some Day Smoker    Packs/day: 0.50    Years: 30.00    Types: Cigarettes    Start date: 03/28/1974  . Smokeless tobacco: Never Used     Comment:  10-27-13  still smoking daily  . Alcohol use No  . Drug use: No  . Sexual activity: Not Currently   Other Topics Concern  . None   Social History Narrative   WORKS FULL TIME    SINGLE   TOBACCO USE-YES   IMPLANTATION OF DUAL-CHAMBER St. JUDE DEFIBRILLATOR     Family History:  The patient's family history includes Heart attack in her brother, maternal grandfather, and paternal grandfather; Stroke in her maternal grandmother.   ROS:   Please see the history of present illness.    She does notice some shortness of breath with activity. There is no edema. She continues to smoke cigarettes. All other systems reviewed and are negative.   PHYSICAL EXAM:   VS:  BP 116/64 (BP Location: Left Arm)   Pulse 98   Ht 5\' 6"  (1.676 m)   Wt 206 lb 1.9 oz (93.5 kg)   BMI 33.27 kg/m  GEN: Well nourished, well developed, in no acute distress . She continues to smoke cigarettes, verified by older of cigarette smoke. HEENT: normal  Neck: no JVD, carotid bruits, or masses Cardiac: RRR; no murmurs, rubs, or gallops,no edema  Respiratory:  clear to auscultation bilaterally, normal work of breathing GI: soft, nontender, nondistended, + BS MS: no deformity or atrophy  Skin: warm and dry, no rash Neuro:  Alert and Oriented x 3, Strength and sensation are intact Psych: euthymic mood, full affect  Wt Readings from Last 3 Encounters:  09/02/16 206 lb 1.9 oz (93.5 kg)  08/27/16 206 lb 2 oz (93.5 kg)  06/21/16 204 lb 4.8 oz (92.7 kg)      Studies/Labs Reviewed:   EKG:  EKG  Not repeated. Reviewed the EKG done by Dr. Lovena Le on 04/29/2016 which demonstrates evidence of old inferior infarct. Otherwise unremarkable.  Recent Labs: 03/15/2016: ALT 23 04/29/2016: B Natriuretic Peptide 191.1; BUN 22; Creatinine, Ser 1.27; Hemoglobin 11.5; Platelets 132; Potassium 3.7; Sodium 138   Lipid Panel No results found for: CHOL, TRIG, HDL, CHOLHDL, VLDL, LDLCALC, LDLDIRECT  Additional studies/ records that were reviewed today include:   Reviewed device interrogation from 08/27/16 and as outlined above.  Echocardiogram 06/01/15: Study Conclusions  - Left ventricle: The cavity size was  normal. Systolic function was   mildly reduced. The estimated ejection fraction was in the range   of 45% to 50%. There is akinesis and aneurysmal deformity of the   basal-midinferior myocardium. - Aortic valve: Trileaflet; mildly thickened, mildly calcified   leaflets. - Mitral valve: A bioprosthesis was present. The findings are   consistent with severe stenosis by mean gradient (6mmHg) however   valve area by pressure half time is consistent with mild (1.68cm   squared) . - Left atrium: The atrium was moderately dilated. - Right ventricle: Pacer wire or catheter noted in right ventricle.  Impressions:  - EF is improved when compared to prior study. Mitral valve   bioprosthesis remains unchanged.   ASSESSMENT:    1. Athscl autologous vein CABG w oth angina pectoris ([place])   2. Chronic systolic HF (heart failure) ([place])   3. S/P MVR (mitral valve replacement)   4. Paroxysmal atrial fibrillation (HCC)   5. Simple chronic bronchitis ([place])   6. CKD (chronic kidney disease) stage 3, GFR 30-59 ml/min   7. Automatic implantable cardioverter-defibrillator in situ      PLAN:  In order of problems listed above:  1. Stable without angina. Prior history of acute inferior infarction and subsequent development of papillary muscle rupture with mitral regurgitation leaned a mitral valve replacement. No ischemic symptoms. 2. No evidence of volume overload. Last LVEF by echo was 45-50%. No symptoms to suggest decompensation. 3. No clinical evidence of valve dysfunction. She denies orthopnea. No murmurs heard. 4. Possible atrial fibrillation identified on device telemetry. Less than 15% burden..Dr. Lovena Le felt that this could be followed rather than active management currently. He did mention this to the patient. 5. The Proventil inhaler is refilled. This should typically be done by her primary care physician.   If she continues to have the possibility of atrial fibrillation, she will need  anticoagulation. She has ovarian cancer may be hypercoagulable. I encouraged her to stop smoking. This is difficult to do given ongoing cancer therapy and related stress/anxiety for which cigarette smoking is a crutch one-year follow-up, earlier if cardiac symptoms.   Medication Adjustments/Labs and Tests Ordered: Current medicines are reviewed at length with the patient  today.  Concerns regarding medicines are outlined above.  Medication changes, Labs and Tests ordered today are listed in the Patient Instructions below. Patient Instructions  Medication Instructions:  None  Labwork: None  Testing/Procedures: None  Follow-Up: Your physician wants you to follow-up in: 1 year with Dr. Tamala Julian.  You will receive a reminder letter in the mail two months in advance. If you don't receive a letter, please call our office to schedule the follow-up appointment.   Any Other Special Instructions Will Be Listed Below (If Applicable).     If you need a refill on your cardiac medications before your next appointment, please call your pharmacy.      Signed, Sinclair Grooms, MD  09/02/2016 12:09 PM    [facility] [address], [place], [place]  [address] Phone: [phone]; Fax: [phone]

## 2016-09-16 ENCOUNTER — Other Ambulatory Visit: Payer: 59

## 2016-09-16 DIAGNOSIS — D3910 Neoplasm of uncertain behavior of unspecified ovary: Secondary | ICD-10-CM

## 2016-09-19 LAB — INHIBIN B: Inhibin B: 122.5 pg/mL — ABNORMAL HIGH (ref 0.0–16.9)

## 2016-09-20 ENCOUNTER — Encounter: Payer: Self-pay | Admitting: Gynecology

## 2016-09-20 ENCOUNTER — Ambulatory Visit: Payer: 59 | Attending: Gynecology | Admitting: Gynecology

## 2016-09-20 VITALS — BP 115/70 | HR 87 | Temp 98.2°F | Resp 18 | Ht 65.95 in | Wt 189.5 lb

## 2016-09-20 DIAGNOSIS — F17210 Nicotine dependence, cigarettes, uncomplicated: Secondary | ICD-10-CM | POA: Insufficient documentation

## 2016-09-20 DIAGNOSIS — Z95810 Presence of automatic (implantable) cardiac defibrillator: Secondary | ICD-10-CM | POA: Diagnosis not present

## 2016-09-20 DIAGNOSIS — Z923 Personal history of irradiation: Secondary | ICD-10-CM | POA: Insufficient documentation

## 2016-09-20 DIAGNOSIS — Z8249 Family history of ischemic heart disease and other diseases of the circulatory system: Secondary | ICD-10-CM | POA: Diagnosis not present

## 2016-09-20 DIAGNOSIS — C569 Malignant neoplasm of unspecified ovary: Secondary | ICD-10-CM | POA: Diagnosis not present

## 2016-09-20 DIAGNOSIS — Z7982 Long term (current) use of aspirin: Secondary | ICD-10-CM | POA: Diagnosis not present

## 2016-09-20 DIAGNOSIS — E7800 Pure hypercholesterolemia, unspecified: Secondary | ICD-10-CM | POA: Diagnosis not present

## 2016-09-20 DIAGNOSIS — Z823 Family history of stroke: Secondary | ICD-10-CM | POA: Insufficient documentation

## 2016-09-20 DIAGNOSIS — Z952 Presence of prosthetic heart valve: Secondary | ICD-10-CM | POA: Diagnosis not present

## 2016-09-20 DIAGNOSIS — Z9889 Other specified postprocedural states: Secondary | ICD-10-CM | POA: Diagnosis not present

## 2016-09-20 DIAGNOSIS — E039 Hypothyroidism, unspecified: Secondary | ICD-10-CM | POA: Insufficient documentation

## 2016-09-20 DIAGNOSIS — Z9221 Personal history of antineoplastic chemotherapy: Secondary | ICD-10-CM | POA: Diagnosis not present

## 2016-09-20 DIAGNOSIS — Z8551 Personal history of malignant neoplasm of bladder: Secondary | ICD-10-CM | POA: Insufficient documentation

## 2016-09-20 DIAGNOSIS — Z96641 Presence of right artificial hip joint: Secondary | ICD-10-CM | POA: Diagnosis not present

## 2016-09-20 DIAGNOSIS — I252 Old myocardial infarction: Secondary | ICD-10-CM | POA: Insufficient documentation

## 2016-09-20 DIAGNOSIS — J45909 Unspecified asthma, uncomplicated: Secondary | ICD-10-CM | POA: Diagnosis not present

## 2016-09-20 DIAGNOSIS — Z79899 Other long term (current) drug therapy: Secondary | ICD-10-CM | POA: Insufficient documentation

## 2016-09-20 DIAGNOSIS — Z888 Allergy status to other drugs, medicaments and biological substances status: Secondary | ICD-10-CM | POA: Insufficient documentation

## 2016-09-20 DIAGNOSIS — I509 Heart failure, unspecified: Secondary | ICD-10-CM | POA: Insufficient documentation

## 2016-09-20 DIAGNOSIS — I2510 Atherosclerotic heart disease of native coronary artery without angina pectoris: Secondary | ICD-10-CM | POA: Diagnosis not present

## 2016-09-20 DIAGNOSIS — Z79810 Long term (current) use of selective estrogen receptor modulators (SERMs): Secondary | ICD-10-CM | POA: Diagnosis not present

## 2016-09-20 DIAGNOSIS — D3910 Neoplasm of uncertain behavior of unspecified ovary: Secondary | ICD-10-CM | POA: Diagnosis present

## 2016-09-20 NOTE — Progress Notes (Signed)
To be more specific regarding possible chemotherapy the regimen I would recommend is as follows:  Taxotere 40 mg/m on day 1 and day 8  A Avastin 15 mg/kg on day 1. This is a 21 day cycle.

## 2016-09-20 NOTE — Patient Instructions (Signed)
Expect a call from scheduling ( approx 1-2 weeks )regarding your appt for a CT scan of chest abdomen, pelvis. Your insurance will require 5-7 business days to preauthorize this scan. After Dr. Fermin Schwab has reviewed the CT Scan results,  you will be notified of these results and when to follow up with our office.

## 2016-09-20 NOTE — Progress Notes (Signed)
Follow Up Note: Gyn-Onc  [name] 56 y.o. female  CC:  No chief complaint on file.  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 I would like to obtain a CT scan of the chest abdomen and pelvis to reassess her disease status before making further recommendations. Certainly, if disease is confined the pelvis on May consider whole pelvis radiation therapy. Alternatively a regimen of Taxotere and a Avastin has had reasonable activity in recent patient's managed at [facility]. We will contact the patient following the CT scan to make further recommendations in collaboration with [name].     Interval history: Patient returns today  for continuing follow-up. She has been on a regimen of tamoxifen for 2 weeks followed by 2 weeks of Megace.  At her last visit 3 months ago her inhibin B had become slightly more elevated. Repeated this week it continues to climb now at 922 (previously 30.) The patient denies any particular symptoms except for diffuse aching which she attributes to the use of Megace. She specifically denies any GI GU or pelvic symptoms.  HPI:  [name] is a 56 year old female initially seen in consultation at the request of [name] regarding management of recurrent granulosa cell tumor.  She first underwent surgery in Feb 2007 for a stage I C granulosa cell tumor. She received 3 cycles of bleomycin, etoposide, and cisplatin chemotherapy as an adjunct.  Due to recurrence on CT imaging, she underwent a radical debulking of recurrent ovarian cancer including rectosigmoid resection with low rectal anastomosis, resection of right distal ureter, ureteroneocystostomy with psoas hitch, omentectomy with [name] on 01/19/13.  Postoperatively, adjuvant therapy was recommended using carboplatin and Taxol. She received 4 cycles but did not tolerated very well and therefore discontinued. He CT scan in  January 2015 showed a subcutaneous nodule in the lower abdomen and the inhibin B is a 39 units per mL.  On 07/27/2013, she underwent a wide local excision of subcutaneous tumor nodule (3 cm) with [name] for recurrent disease.  She was placed on Femara and has been tolerating well.  Recently, CT imaging was performed on 06/15/15 that resulted mixed treatment response in the peritoneal tumor implants, significantly decreased size of the right paracolic gutter tumor implants, interval growth of the left pelvic side wall tumor, with no new sites of metastatic disease.  She was advised to continue taking the Femara with follow up scan for Feb or March.  Inhibin B increasing with value in Feb 2017 at 112.  Follow up imaging in March 2017 resulted: Mild progression of tumor in the left pelvis with increasing sidewall and perirectal nodularity. 2. Stable minimal nodularity in the right pericolic gutter. No other peritoneal disease identified. 3. No new lesions identified.     On September 19, 2015, she underwent an Exploratory laparotomy, radical resection of recurrent granulosa cell tumor including complete dissection of the right pararectal space and excision of tumor nodules, left ureterolysis for retroperitoneal fibrosis, resection of nodule from the left obturator space, resection of nodules from the right paracolic gutter, right para-aortic lymphadenectomy by [name].  Her post-operative course was uneventful except for low grade temperatures on post-op day 3.  Blood cultures still pending.  Urine culture with mixed flora.  No further fevers reported during hospital stay.  Final path revealed: Final Diagnosis  A: Gutter nodule, left paracolic, removal  - Implant of granulosa cell tumor, at least  1.0 cm (see comment)  B: Lymph node, right paraaortic, removal  - No tumor identified in one lymph node (0/1)  C: Mass and right ovarian vessels, excision  - Segment of right fallopian tube with  evidence of prior surgical transection, focal dilatation, and chronic perisalpingitis  - Medium-caliber vessels with intraluminal thrombus  - No granulosa cell tumor identified  D: Pelvic side wall nodule, right, biopsy  - Two implants of granulosa cell tumor, 0.7 cm and 0.6 cm  E: Mass, left pararectal, biopsy  - Cystic implant of granulosa cell tumor, 0.8 cm  F: Mass, left, pararectal, biopsy  - Three implants of granulosa cell tumor, up to 1.2 cm  - One lymph node negative for granulosa cell tumor (0/1)  G: Nodule, left pelvis bladder, biopsy  - Minute focus of granulosa cell tumor identified at the edge of fibrovascular and adipose tissue, may represent artifact  H: Pelvic mass, deep left, excision  - Implant of granulosa cell tumor, 2.2 cm, with cystic change   Comment  The tumor implants noted in specimens A and D-H display the characteristic architectural and cytologic features of a granulosa cell tumor, including microfollicular pattern, blood-filled cysts, nuclear grooves, and Call-Exner bodies. In some sections, the tumor nodules have apparent fibrous capsules. We favor that these are reactive capsules, but cannot exclude the possibility of granulosa cell tumor involving completely-replaced lymph nodes. The tumor was 3+ progesterone receptor, 1+ estrogen receptor positive.   She was placed on tamoxifen 20 mg daily for 2 weeks followed by Megace 40 mg 3 times a day for 2 weeks. This was begun on 11/24/2015. At that time and baseline inhibin B was obtained (201) after 3 months of therapy the inhibin B had fallen to 66.Marland Kitchen Unfortunately, beginning in December 2017 hertz inhibin B began to rise and was 122 units in March 2018. At this juncture we discontinued tamoxifen and Megace.   Review of Systems 10 point Are answered review of systems is negative except as noted above.  Vitals:  Blood pressure 105/62, pulse 82, temperature 97.8 F (36.6 C), temperature source Oral, resp. rate 18,  height 5\' 6"  (1.676 m), weight 210 lb 4.8 oz (95.391 kg), SpO2 100 %.  Physical Exam:  General: Well developed, well nourished female in no acute distress. Alert and oriented x 3.   .  Skin: No rashes or lesions present. Back: No CVA tenderness.  Abdomen: Abdomen soft, non-tender and mildly obese. Active bowel sounds in all quadrants. Midline incision well-healed.  Extremities: No bilateral cyanosis, edema, or clubbing.   Pelvic exam:  EGBUS: Normal  Vagina: Normal without lesions  Cervix and uterus surgically absent. Bimanual rectovaginal exam reveal no masses induration or nodularity.  Current Meds:  Outpatient Encounter Prescriptions as of 09/20/2016  Medication Sig  . albuterol (PROVENTIL HFA;VENTOLIN HFA) 108 (90 Base) MCG/ACT inhaler Inhale 2 puffs into the lungs every 4 (four) hours as needed for wheezing or shortness of breath.  Marland Kitchen aspirin 325 MG EC tablet Take 325 mg by mouth every morning. Reported on 10/20/2015  . b complex vitamins tablet Take 1 tablet by mouth daily.  . carvedilol (COREG) 12.5 MG tablet TAKE 1 TABLET BY MOUTH 2 TIMES DAILY WITH A MEAL.  . cholecalciferol (VITAMIN D-400) 400 UNITS TABS tablet Take 400 Units by mouth 2 (two) times daily.  . diphenhydrAMINE (BENADRYL) 25 MG tablet Take 25 mg by mouth as needed for itching or allergies. ALLERGIES  . fish oil-omega-3 fatty acids 1000 MG capsule  Take 1 g by mouth 2 (two) times daily.  . furosemide (LASIX) 20 MG tablet Take 1 tablet (20 mg total) by mouth daily. May take an extra tablet (20 mg) as needed for leg swelling or shortness of breath.  Marland Kitchen LORazepam (ATIVAN) 0.5 MG tablet TAKE 1 TABLET BY MOUTH EVERY 6 HOURS AS NEEDED FOR NAUSEA AND VOMITING  . megestrol (MEGACE) 40 MG tablet   . Multiple Vitamins-Minerals (MULTIVITAMIN PO) Take 1 tablet by mouth daily.   Marland Kitchen spironolactone (ALDACTONE) 25 MG tablet Take 1 tablet (25 mg total) by mouth 2 (two) times daily.  . tamoxifen (NOLVADEX) 20 MG tablet   . Vitamin  D, Ergocalciferol, (DRISDOL) 50000 units CAPS capsule TAKE 1 CAPSULE (50,000 UNITS TOTAL) BY MOUTH EVERY 7 (SEVEN) DAYS.  Marland Kitchen zolpidem (AMBIEN) 5 MG tablet TAKE 1 TABLET BY MOUTH AT BEDTIME   No facility-administered encounter medications on file as of 09/20/2016.     Allergy:  Allergies  Allergen Reactions  . Prochlorperazine Edisylate Other (See Comments)    Nervous/ flutter/ shakes--compazine  . Adhesive [Tape] Other (See Comments)    Redness/skin peeling Redness/hives from adhesive tape( tolerates latex gloves); tolerates paper tape  . Prochlorperazine Other (See Comments)    Jitters, hyper    Social Hx:   Social History   Social History  . Marital status: Divorced    Spouse name: N/A  . Number of children: N/A  . Years of education: N/A   Occupational History  .      WORKS FULL TIME   Social History Main Topics  . Smoking status: Current Some Day Smoker    Packs/day: 0.50    Years: 30.00    Types: Cigarettes    Start date: 03/28/1974  . Smokeless tobacco: Never Used     Comment:  10-27-13  still smoking daily  . Alcohol use No  . Drug use: No  . Sexual activity: Not Currently   Other Topics Concern  . Not on file   Social History Narrative   WORKS FULL TIME   SINGLE   TOBACCO USE-YES   IMPLANTATION OF DUAL-CHAMBER St. JUDE DEFIBRILLATOR    Past Surgical Hx:  Past Surgical History:  Procedure Laterality Date  . ABDOMINAL HYSTERECTOMY N/A 07/27/2013   Procedure: TUMOR EXCISION OF ABDOMINAL MASS;  Surgeon: Alvino Chapel, MD;  [facility];  Service: Gynecology;  Laterality: N/A;  . APPENDECTOMY  1989  . BILATERAL OOPHORECTOMY  2007  . CARDIAC DEFIBRILLATOR PLACEMENT  02/06/2012   "lead change"  . CARDIAC VALVE REPLACEMENT    . CYSTOSCOPY Right 02/23/2013   Procedure: CYSTOSCOPY WITH STENT REMOVAL;  Surgeon: Alvino Chapel, MD;  [facility];  Service: Gynecology;  Laterality: Right;  . IMPLANTABLE CARDIOVERTER DEFIBRILLATOR REVISION  N/A 02/06/2012   Procedure: IMPLANTABLE CARDIOVERTER DEFIBRILLATOR REVISION;  Surgeon: Evans Lance, MD;  Location: [facility] CATH LAB;  Service: Cardiovascular;  Laterality: N/A;  . INSERT / REPLACE / REMOVE PACEMAKER  10/2009   DUAL-CHAMBER St. JUDE DEFIBRILLATOR  . LAPAROTOMY N/A 01/19/2013   Procedure: TUMOR DEBULKING / BOWEL RESECTION/INSERTION RIGHT UTERERAL DOUBLE J STENT/OMENTECTOMY;  Surgeon: Alvino Chapel, MD;  [facility];  Service: Gynecology;  Laterality: N/A;  . MITRAL VALVE REPLACEMENT  2011   "pig valve"  . THYMUS REMOVED    . TONSILLECTOMY AND ADENOIDECTOMY  ~ 1967  . TOTAL HIP ARTHROPLASTY  05/06/2012   Procedure: TOTAL HIP ARTHROPLASTY;  Surgeon: Gearlean Alf, MD;  [facility];  Service: Orthopedics;  Laterality: Right;  . TRANSURETHRAL RESECTION OF BLADDER  2009   "for bladder cancer"  . TUBAL LIGATION  1993  . VAGINAL HYSTERECTOMY  2001    Past Medical Hx:  Past Medical History:  Diagnosis Date  . Arrhythmia    ventricular tachycardia  . Arthritis 02/06/2012   "everywhere"  . Asthma    as a child  . Automatic implantable cardioverter-defibrillator in situ    DR. Beckie Salts   . Bladder cancer [facility]) 2009   "injected medicine to get rid of it"  . CHF (congestive heart failure) ([place])   . Coronary artery disease    DR. Linard Millers  . 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 High cholesterol   . Hypothyroidism   . ICD (implantable cardiac defibrillator) in place   . Myocardial infarction 2011  . Neuropathy (HCC)    FEET AND HANDS - FROM CHEMO - BUT MUCH IMPROVED  . Pacemaker   . Pain    JOINT PAINS AND MUSCLE ACHES ALL OVER.  Marland Kitchen Pneumonia    as child  . Port-a-cath in place    RIGHT UPPER CHEST  . Unspecified systolic heart failure   . Ventricular fibrillation ([place])     Family Hx:  Family History  Problem Relation Age of Onset  . Heart attack Paternal Grandfather   . Heart attack Maternal Grandfather   .  Stroke Maternal Grandmother   . Heart attack Brother         Marti Sleigh, NP 09/20/2016, 11:59 AM

## 2016-09-23 ENCOUNTER — Telehealth: Payer: Self-pay

## 2016-09-23 NOTE — Telephone Encounter (Signed)
-----   Message from Loletta Parish sent at 09/23/2016 11:21 AM EDT ----- Regarding: RE: CT pre auth needed Authorization Approved.  ----- Message ----- From: Daralene Milch, RN Sent: 09/20/2016  12:22 PM To: Loletta Parish Subject: CT pre auth needed                             Need Pre Auth for CT Scan of Abdomen/Pelvis Dx: malignant granulosa cell tumor of the ovary Last CT scan was 12/13/15

## 2016-09-23 NOTE — Telephone Encounter (Signed)
Left message for pt to call to schedule her CT Scan and the pre auth number #[id number]. Message left on home phone and cell phone.

## 2016-10-01 ENCOUNTER — Telehealth: Payer: Self-pay | Admitting: *Deleted

## 2016-10-01 NOTE — Telephone Encounter (Signed)
I have called and spoke with the patient regarding her CT scan. Patient unable to have scan on this Friday. I have given the patient the number to call and reschedule that appt.

## 2016-10-04 ENCOUNTER — Ambulatory Visit (HOSPITAL_COMMUNITY): Admission: RE | Admit: 2016-10-04 | Payer: BLUE CROSS/BLUE SHIELD | Source: Ambulatory Visit

## 2016-10-14 ENCOUNTER — Telehealth: Payer: Self-pay | Admitting: *Deleted

## 2016-10-14 NOTE — Telephone Encounter (Signed)
I have called and left a message for the patient to call the office. Patient needs to reschedule her CT scan.

## 2016-11-04 ENCOUNTER — Telehealth: Payer: Self-pay | Admitting: *Deleted

## 2016-11-04 NOTE — Telephone Encounter (Signed)
I have called and left the patient a message to call the office back regarding her CT scan. Patient needs to reschedule her scan.

## 2016-11-09 ENCOUNTER — Other Ambulatory Visit: Payer: Self-pay | Admitting: Interventional Cardiology

## 2016-11-09 ENCOUNTER — Other Ambulatory Visit: Payer: Self-pay | Admitting: Internal Medicine

## 2016-11-09 DIAGNOSIS — I5022 Chronic systolic (congestive) heart failure: Secondary | ICD-10-CM

## 2016-11-09 DIAGNOSIS — I4729 Other ventricular tachycardia: Secondary | ICD-10-CM

## 2016-11-09 DIAGNOSIS — Z95810 Presence of automatic (implantable) cardiac defibrillator: Secondary | ICD-10-CM

## 2016-11-09 DIAGNOSIS — I472 Ventricular tachycardia: Secondary | ICD-10-CM

## 2016-11-11 NOTE — Telephone Encounter (Signed)
Medication Detail    Disp Refills Start End   furosemide (LASIX) 20 MG tablet 135 tablet 3 08/27/2016    Sig - Route: Take 1 tablet (20 mg total) by mouth daily. May take an extra tablet (20 mg) as needed for leg swelling or shortness of breath. - Oral   E-Prescribing Status: Receipt confirmed by pharmacy (08/27/2016 10:19 AM EST)   Associated Diagnoses   Chronic systolic HF (heart failure) (HCC)     VENTRICULAR TACHYCARDIA     Automatic implantable cardioverter-defibrillator in situ     Pharmacy   [id number] - [place], [place] - [address] AT [address]

## 2016-11-12 ENCOUNTER — Other Ambulatory Visit: Payer: Self-pay | Admitting: Hematology & Oncology

## 2016-11-16 ENCOUNTER — Other Ambulatory Visit: Payer: Self-pay | Admitting: Hematology & Oncology

## 2016-12-06 ENCOUNTER — Encounter (HOSPITAL_BASED_OUTPATIENT_CLINIC_OR_DEPARTMENT_OTHER): Payer: Self-pay

## 2016-12-06 ENCOUNTER — Ambulatory Visit (HOSPITAL_BASED_OUTPATIENT_CLINIC_OR_DEPARTMENT_OTHER)
Admission: RE | Admit: 2016-12-06 | Discharge: 2016-12-06 | Disposition: A | Discharge status: Home / Self Care | Payer: BLUE CROSS/BLUE SHIELD | Source: Ambulatory Visit | Attending: Gynecology | Admitting: Gynecology

## 2016-12-06 DIAGNOSIS — M5136 Other intervertebral disc degeneration, lumbar region: Secondary | ICD-10-CM | POA: Insufficient documentation

## 2016-12-06 DIAGNOSIS — C569 Malignant neoplasm of unspecified ovary: Secondary | ICD-10-CM | POA: Insufficient documentation

## 2016-12-06 DIAGNOSIS — Z90710 Acquired absence of both cervix and uterus: Secondary | ICD-10-CM | POA: Insufficient documentation

## 2016-12-06 DIAGNOSIS — D3910 Neoplasm of uncertain behavior of unspecified ovary: Secondary | ICD-10-CM

## 2016-12-06 DIAGNOSIS — Z96641 Presence of right artificial hip joint: Secondary | ICD-10-CM | POA: Diagnosis not present

## 2016-12-06 DIAGNOSIS — R911 Solitary pulmonary nodule: Secondary | ICD-10-CM | POA: Insufficient documentation

## 2016-12-06 DIAGNOSIS — M19012 Primary osteoarthritis, left shoulder: Secondary | ICD-10-CM | POA: Diagnosis not present

## 2016-12-06 DIAGNOSIS — Z951 Presence of aortocoronary bypass graft: Secondary | ICD-10-CM | POA: Insufficient documentation

## 2016-12-06 DIAGNOSIS — M5134 Other intervertebral disc degeneration, thoracic region: Secondary | ICD-10-CM | POA: Diagnosis not present

## 2016-12-06 DIAGNOSIS — I700 Atherosclerosis of aorta: Secondary | ICD-10-CM | POA: Insufficient documentation

## 2016-12-06 MED ORDER — IOPAMIDOL (ISOVUE-300) INJECTION 61%
100.0000 mL | Freq: Once | INTRAVENOUS | Status: AC | PRN
  Administered 2016-12-06: 100 mL via INTRAVENOUS

## 2016-12-12 ENCOUNTER — Other Ambulatory Visit: Payer: Self-pay | Admitting: Interventional Cardiology

## 2017-01-10 ENCOUNTER — Other Ambulatory Visit: Payer: Self-pay | Admitting: Internal Medicine

## 2017-02-12 ENCOUNTER — Other Ambulatory Visit: Payer: Self-pay

## 2017-02-12 MED ORDER — CARVEDILOL 12.5 MG PO TABS: ORAL_TABLET | ORAL | 1 refills | Status: DC

## 2017-02-13 ENCOUNTER — Other Ambulatory Visit: Payer: Self-pay | Admitting: Hematology & Oncology

## 2017-02-25 ENCOUNTER — Ambulatory Visit (INDEPENDENT_AMBULATORY_CARE_PROVIDER_SITE_OTHER): Payer: BLUE CROSS/BLUE SHIELD | Admitting: *Deleted

## 2017-02-25 DIAGNOSIS — I472 Ventricular tachycardia: Secondary | ICD-10-CM

## 2017-02-25 DIAGNOSIS — I4729 Other ventricular tachycardia: Secondary | ICD-10-CM

## 2017-02-26 NOTE — Progress Notes (Signed)
Remote ICD transmission.   

## 2017-03-05 ENCOUNTER — Encounter: Payer: Self-pay | Admitting: Cardiology

## 2017-03-07 LAB — CUP PACEART REMOTE DEVICE CHECK
Date Time Interrogation Session: 20180914081450
HighPow Impedance: 71 Ohm
Implantable Lead Implant Date: 20110503
Implantable Pulse Generator Implant Date: 20110503
Lead Channel Impedance Value: 490 Ohm
Lead Channel Sensing Intrinsic Amplitude: 11.4 mV
Lead Channel Sensing Intrinsic Amplitude: 2.8 mV
MDC IDC LEAD IMPLANT DT: 20130815
MDC IDC LEAD LOCATION: 753859
MDC IDC LEAD LOCATION: 753860
MDC IDC MSMT BATTERY REMAINING LONGEVITY: 28 mo
MDC IDC MSMT LEADCHNL RV IMPEDANCE VALUE: 430 Ohm
MDC IDC PG SERIAL: 778164
MDC IDC SET LEADCHNL RA PACING AMPLITUDE: 2 V
MDC IDC SET LEADCHNL RV PACING AMPLITUDE: 2.5 V
MDC IDC SET LEADCHNL RV PACING PULSEWIDTH: 1.3 ms
MDC IDC SET LEADCHNL RV SENSING SENSITIVITY: 0.5 mV
MDC IDC STAT BRADY RA PERCENT PACED: 2.3 %
MDC IDC STAT BRADY RV PERCENT PACED: 1 % — AB

## 2017-03-17 ENCOUNTER — Other Ambulatory Visit: Payer: Self-pay | Admitting: Hematology & Oncology

## 2017-05-06 ENCOUNTER — Other Ambulatory Visit: Payer: Self-pay | Admitting: Interventional Cardiology

## 2017-05-07 ENCOUNTER — Other Ambulatory Visit: Payer: Self-pay | Admitting: Hematology & Oncology

## 2017-05-12 ENCOUNTER — Other Ambulatory Visit: Payer: Self-pay | Admitting: Hematology & Oncology

## 2017-05-22 ENCOUNTER — Telehealth: Payer: Self-pay | Admitting: Cardiology

## 2017-05-22 NOTE — Telephone Encounter (Signed)
Spoke w/ pt and requested that she send a manual transmission b/c her home monitor has not updated in at least 7 days.   

## 2017-05-27 ENCOUNTER — Telehealth: Payer: Self-pay | Admitting: Cardiology

## 2017-05-27 ENCOUNTER — Ambulatory Visit (INDEPENDENT_AMBULATORY_CARE_PROVIDER_SITE_OTHER): Payer: BLUE CROSS/BLUE SHIELD | Admitting: *Deleted

## 2017-05-27 DIAGNOSIS — I4729 Other ventricular tachycardia: Secondary | ICD-10-CM

## 2017-05-27 DIAGNOSIS — I472 Ventricular tachycardia: Secondary | ICD-10-CM | POA: Diagnosis not present

## 2017-05-27 NOTE — Telephone Encounter (Signed)
Spoke with pt and reminded pt of remote transmission that is due today. Pt verbalized understanding.   

## 2017-05-28 ENCOUNTER — Encounter: Payer: Self-pay | Admitting: Cardiology

## 2017-05-28 LAB — CUP PACEART REMOTE DEVICE CHECK
Battery Remaining Longevity: 31 mo
Battery Remaining Percentage: 30 %
Battery Voltage: 2.87 V
Brady Statistic AS VP Percent: 1 %
Brady Statistic RA Percent Paced: 2 %
HighPow Impedance: 72 Ohm
HighPow Impedance: 72 Ohm
Implantable Lead Implant Date: 20130815
Implantable Lead Location: 753860
Implantable Pulse Generator Implant Date: 20110503
Lead Channel Impedance Value: 440 Ohm
Lead Channel Impedance Value: 600 Ohm
Lead Channel Pacing Threshold Amplitude: 1.25 V
Lead Channel Pacing Threshold Pulse Width: 1.3 ms
Lead Channel Sensing Intrinsic Amplitude: 11.4 mV
Lead Channel Setting Pacing Amplitude: 2 V
Lead Channel Setting Pacing Pulse Width: 1.3 ms
MDC IDC LEAD IMPLANT DT: 20110503
MDC IDC LEAD LOCATION: 753859
MDC IDC MSMT LEADCHNL RA PACING THRESHOLD AMPLITUDE: 0.5 V
MDC IDC MSMT LEADCHNL RA PACING THRESHOLD PULSEWIDTH: 0.5 ms
MDC IDC MSMT LEADCHNL RA SENSING INTR AMPL: 2.7 mV
MDC IDC PG SERIAL: 778164
MDC IDC SESS DTM: 20181205104707
MDC IDC SET LEADCHNL RV PACING AMPLITUDE: 2.5 V
MDC IDC SET LEADCHNL RV SENSING SENSITIVITY: 0.5 mV
MDC IDC STAT BRADY AP VP PERCENT: 1 %
MDC IDC STAT BRADY AP VS PERCENT: 1 %
MDC IDC STAT BRADY AS VS PERCENT: 99 %
MDC IDC STAT BRADY RV PERCENT PACED: 1 %

## 2017-05-28 NOTE — Progress Notes (Signed)
Remote ICD transmission.   

## 2017-06-03 ENCOUNTER — Encounter (HOSPITAL_BASED_OUTPATIENT_CLINIC_OR_DEPARTMENT_OTHER): Payer: Self-pay

## 2017-06-03 ENCOUNTER — Emergency Department (HOSPITAL_BASED_OUTPATIENT_CLINIC_OR_DEPARTMENT_OTHER): Payer: BLUE CROSS/BLUE SHIELD

## 2017-06-03 ENCOUNTER — Inpatient Hospital Stay (HOSPITAL_BASED_OUTPATIENT_CLINIC_OR_DEPARTMENT_OTHER)
Admission: EM | Admit: 2017-06-03 | Discharge: 2017-06-06 | DRG: 386 | Disposition: A | Discharge status: Home / Self Care | Payer: BLUE CROSS/BLUE SHIELD | Attending: Internal Medicine | Admitting: Internal Medicine

## 2017-06-03 ENCOUNTER — Other Ambulatory Visit (HOSPITAL_BASED_OUTPATIENT_CLINIC_OR_DEPARTMENT_OTHER): Payer: BLUE CROSS/BLUE SHIELD

## 2017-06-03 ENCOUNTER — Other Ambulatory Visit: Payer: Self-pay

## 2017-06-03 DIAGNOSIS — I25718 Atherosclerosis of autologous vein coronary artery bypass graft(s) with other forms of angina pectoris: Secondary | ICD-10-CM | POA: Diagnosis present

## 2017-06-03 DIAGNOSIS — I471 Supraventricular tachycardia: Secondary | ICD-10-CM | POA: Diagnosis present

## 2017-06-03 DIAGNOSIS — K5100 Ulcerative (chronic) pancolitis without complications: Secondary | ICD-10-CM | POA: Diagnosis present

## 2017-06-03 DIAGNOSIS — Z95810 Presence of automatic (implantable) cardiac defibrillator: Secondary | ICD-10-CM | POA: Diagnosis not present

## 2017-06-03 DIAGNOSIS — D631 Anemia in chronic kidney disease: Secondary | ICD-10-CM | POA: Diagnosis present

## 2017-06-03 DIAGNOSIS — I493 Ventricular premature depolarization: Secondary | ICD-10-CM | POA: Diagnosis present

## 2017-06-03 DIAGNOSIS — J440 Chronic obstructive pulmonary disease with acute lower respiratory infection: Secondary | ICD-10-CM | POA: Diagnosis present

## 2017-06-03 DIAGNOSIS — N183 Chronic kidney disease, stage 3 unspecified: Secondary | ICD-10-CM | POA: Diagnosis present

## 2017-06-03 DIAGNOSIS — I252 Old myocardial infarction: Secondary | ICD-10-CM

## 2017-06-03 DIAGNOSIS — R197 Diarrhea, unspecified: Secondary | ICD-10-CM | POA: Diagnosis not present

## 2017-06-03 DIAGNOSIS — E7800 Pure hypercholesterolemia, unspecified: Secondary | ICD-10-CM | POA: Diagnosis present

## 2017-06-03 DIAGNOSIS — E039 Hypothyroidism, unspecified: Secondary | ICD-10-CM | POA: Diagnosis present

## 2017-06-03 DIAGNOSIS — R791 Abnormal coagulation profile: Secondary | ICD-10-CM | POA: Diagnosis present

## 2017-06-03 DIAGNOSIS — Z951 Presence of aortocoronary bypass graft: Secondary | ICD-10-CM | POA: Diagnosis not present

## 2017-06-03 DIAGNOSIS — I480 Paroxysmal atrial fibrillation: Secondary | ICD-10-CM | POA: Diagnosis present

## 2017-06-03 DIAGNOSIS — D3910 Neoplasm of uncertain behavior of unspecified ovary: Secondary | ICD-10-CM | POA: Diagnosis not present

## 2017-06-03 DIAGNOSIS — I5022 Chronic systolic (congestive) heart failure: Secondary | ICD-10-CM | POA: Diagnosis present

## 2017-06-03 DIAGNOSIS — E876 Hypokalemia: Secondary | ICD-10-CM | POA: Diagnosis present

## 2017-06-03 DIAGNOSIS — G629 Polyneuropathy, unspecified: Secondary | ICD-10-CM | POA: Diagnosis present

## 2017-06-03 DIAGNOSIS — J410 Simple chronic bronchitis: Secondary | ICD-10-CM | POA: Diagnosis not present

## 2017-06-03 DIAGNOSIS — C786 Secondary malignant neoplasm of retroperitoneum and peritoneum: Secondary | ICD-10-CM | POA: Diagnosis present

## 2017-06-03 DIAGNOSIS — I700 Atherosclerosis of aorta: Secondary | ICD-10-CM | POA: Diagnosis present

## 2017-06-03 DIAGNOSIS — R109 Unspecified abdominal pain: Secondary | ICD-10-CM | POA: Diagnosis not present

## 2017-06-03 DIAGNOSIS — I130 Hypertensive heart and chronic kidney disease with heart failure and stage 1 through stage 4 chronic kidney disease, or unspecified chronic kidney disease: Secondary | ICD-10-CM | POA: Diagnosis present

## 2017-06-03 DIAGNOSIS — Z79818 Long term (current) use of other agents affecting estrogen receptors and estrogen levels: Secondary | ICD-10-CM

## 2017-06-03 DIAGNOSIS — I272 Pulmonary hypertension, unspecified: Secondary | ICD-10-CM | POA: Diagnosis present

## 2017-06-03 DIAGNOSIS — Z8551 Personal history of malignant neoplasm of bladder: Secondary | ICD-10-CM

## 2017-06-03 DIAGNOSIS — Z79899 Other long term (current) drug therapy: Secondary | ICD-10-CM

## 2017-06-03 DIAGNOSIS — Z888 Allergy status to other drugs, medicaments and biological substances status: Secondary | ICD-10-CM | POA: Diagnosis not present

## 2017-06-03 DIAGNOSIS — E871 Hypo-osmolality and hyponatremia: Secondary | ICD-10-CM | POA: Diagnosis present

## 2017-06-03 DIAGNOSIS — J209 Acute bronchitis, unspecified: Secondary | ICD-10-CM | POA: Diagnosis not present

## 2017-06-03 DIAGNOSIS — E872 Acidosis: Secondary | ICD-10-CM | POA: Diagnosis present

## 2017-06-03 DIAGNOSIS — J40 Bronchitis, not specified as acute or chronic: Secondary | ICD-10-CM | POA: Diagnosis not present

## 2017-06-03 DIAGNOSIS — I255 Ischemic cardiomyopathy: Secondary | ICD-10-CM | POA: Diagnosis present

## 2017-06-03 DIAGNOSIS — Z7982 Long term (current) use of aspirin: Secondary | ICD-10-CM

## 2017-06-03 DIAGNOSIS — Z823 Family history of stroke: Secondary | ICD-10-CM

## 2017-06-03 DIAGNOSIS — Z9221 Personal history of antineoplastic chemotherapy: Secondary | ICD-10-CM | POA: Diagnosis not present

## 2017-06-03 DIAGNOSIS — Z91048 Other nonmedicinal substance allergy status: Secondary | ICD-10-CM

## 2017-06-03 DIAGNOSIS — Z90710 Acquired absence of both cervix and uterus: Secondary | ICD-10-CM

## 2017-06-03 DIAGNOSIS — Z96641 Presence of right artificial hip joint: Secondary | ICD-10-CM | POA: Diagnosis present

## 2017-06-03 DIAGNOSIS — Z8249 Family history of ischemic heart disease and other diseases of the circulatory system: Secondary | ICD-10-CM

## 2017-06-03 DIAGNOSIS — I959 Hypotension, unspecified: Secondary | ICD-10-CM | POA: Diagnosis present

## 2017-06-03 DIAGNOSIS — I2729 Other secondary pulmonary hypertension: Secondary | ICD-10-CM | POA: Diagnosis not present

## 2017-06-03 DIAGNOSIS — C569 Malignant neoplasm of unspecified ovary: Secondary | ICD-10-CM | POA: Diagnosis present

## 2017-06-03 DIAGNOSIS — J449 Chronic obstructive pulmonary disease, unspecified: Secondary | ICD-10-CM | POA: Diagnosis present

## 2017-06-03 DIAGNOSIS — R0600 Dyspnea, unspecified: Secondary | ICD-10-CM | POA: Diagnosis present

## 2017-06-03 DIAGNOSIS — I2510 Atherosclerotic heart disease of native coronary artery without angina pectoris: Secondary | ICD-10-CM | POA: Diagnosis present

## 2017-06-03 DIAGNOSIS — I050 Rheumatic mitral stenosis: Secondary | ICD-10-CM | POA: Diagnosis not present

## 2017-06-03 DIAGNOSIS — I342 Nonrheumatic mitral (valve) stenosis: Secondary | ICD-10-CM | POA: Diagnosis not present

## 2017-06-03 DIAGNOSIS — E785 Hyperlipidemia, unspecified: Secondary | ICD-10-CM | POA: Diagnosis present

## 2017-06-03 DIAGNOSIS — K529 Noninfective gastroenteritis and colitis, unspecified: Secondary | ICD-10-CM | POA: Diagnosis not present

## 2017-06-03 DIAGNOSIS — Z953 Presence of xenogenic heart valve: Secondary | ICD-10-CM

## 2017-06-03 DIAGNOSIS — F17210 Nicotine dependence, cigarettes, uncomplicated: Secondary | ICD-10-CM | POA: Diagnosis present

## 2017-06-03 DIAGNOSIS — Z79810 Long term (current) use of selective estrogen receptor modulators (SERMs): Secondary | ICD-10-CM

## 2017-06-03 DIAGNOSIS — R0602 Shortness of breath: Secondary | ICD-10-CM | POA: Diagnosis not present

## 2017-06-03 DIAGNOSIS — Z952 Presence of prosthetic heart valve: Secondary | ICD-10-CM | POA: Diagnosis not present

## 2017-06-03 HISTORY — DX: Acquired absence of other organs: Z90.89

## 2017-06-03 HISTORY — DX: Ventricular tachycardia, unspecified: I47.20

## 2017-06-03 HISTORY — DX: Chronic kidney disease, stage 3 unspecified: N18.30

## 2017-06-03 HISTORY — DX: Supraventricular tachycardia, unspecified: I47.10

## 2017-06-03 HISTORY — DX: Chronic systolic (congestive) heart failure: I50.22

## 2017-06-03 HISTORY — DX: Ventricular tachycardia: I47.2

## 2017-06-03 HISTORY — DX: Ischemic cardiomyopathy: I25.5

## 2017-06-03 HISTORY — DX: Supraventricular tachycardia: I47.1

## 2017-06-03 HISTORY — DX: Chronic kidney disease, stage 3 (moderate): N18.3

## 2017-06-03 HISTORY — DX: Tobacco use: Z72.0

## 2017-06-03 HISTORY — DX: Paroxysmal atrial fibrillation: I48.0

## 2017-06-03 LAB — CBC
HCT: 32 % — ABNORMAL LOW (ref 36.0–46.0)
Hemoglobin: 10.9 g/dL — ABNORMAL LOW (ref 12.0–15.0)
MCH: 31.6 pg (ref 26.0–34.0)
MCHC: 34.1 g/dL (ref 30.0–36.0)
MCV: 92.8 fL (ref 78.0–100.0)
PLATELETS: 155 10*3/uL (ref 150–400)
RBC: 3.45 MIL/uL — AB (ref 3.87–5.11)
RDW: 13.6 % (ref 11.5–15.5)
WBC: 14.8 10*3/uL — ABNORMAL HIGH (ref 4.0–10.5)

## 2017-06-03 LAB — C DIFFICILE QUICK SCREEN W PCR REFLEX
C DIFFICLE (CDIFF) ANTIGEN: NEGATIVE
C Diff interpretation: NOT DETECTED
C Diff toxin: NEGATIVE

## 2017-06-03 LAB — CBC WITH DIFFERENTIAL/PLATELET
BASOS ABS: 0 10*3/uL (ref 0.0–0.1)
BASOS PCT: 0 %
Eosinophils Absolute: 0.6 10*3/uL (ref 0.0–0.7)
Eosinophils Relative: 4 %
HEMATOCRIT: 34.5 % — AB (ref 36.0–46.0)
Hemoglobin: 11.9 g/dL — ABNORMAL LOW (ref 12.0–15.0)
Lymphocytes Relative: 8 %
Lymphs Abs: 1.3 10*3/uL (ref 0.7–4.0)
MCH: 31.9 pg (ref 26.0–34.0)
MCHC: 34.5 g/dL (ref 30.0–36.0)
MCV: 92.5 fL (ref 78.0–100.0)
MONO ABS: 1.2 10*3/uL — AB (ref 0.1–1.0)
Monocytes Relative: 8 %
NEUTROS ABS: 12 10*3/uL — AB (ref 1.7–7.7)
NEUTROS PCT: 80 %
Platelets: 151 10*3/uL (ref 150–400)
RBC: 3.73 MIL/uL — AB (ref 3.87–5.11)
RDW: 13.1 % (ref 11.5–15.5)
WBC: 15.1 10*3/uL — AB (ref 4.0–10.5)

## 2017-06-03 LAB — CREATININE, SERUM
CREATININE: 1.26 mg/dL — AB (ref 0.44–1.00)
GFR calc Af Amer: 54 mL/min — ABNORMAL LOW (ref >60)
GFR calc non Af Amer: 46 mL/min — ABNORMAL LOW (ref >60)

## 2017-06-03 LAB — BRAIN NATRIURETIC PEPTIDE: B NATRIURETIC PEPTIDE 5: 464 pg/mL — AB (ref 0.0–100.0)

## 2017-06-03 LAB — COMPREHENSIVE METABOLIC PANEL
ALBUMIN: 3.4 g/dL — AB (ref 3.5–5.0)
ALK PHOS: 61 U/L (ref 38–126)
ALT: 19 U/L (ref 14–54)
AST: 23 U/L (ref 15–41)
Anion gap: 10 (ref 5–15)
BILIRUBIN TOTAL: 0.7 mg/dL (ref 0.3–1.2)
BUN: 18 mg/dL (ref 6–20)
CALCIUM: 8.6 mg/dL — AB (ref 8.9–10.3)
CO2: 24 mmol/L (ref 22–32)
CREATININE: 1.23 mg/dL — AB (ref 0.44–1.00)
Chloride: 98 mmol/L — ABNORMAL LOW (ref 101–111)
GFR calc Af Amer: 55 mL/min — ABNORMAL LOW (ref >60)
GFR calc non Af Amer: 48 mL/min — ABNORMAL LOW (ref >60)
GLUCOSE: 98 mg/dL (ref 65–99)
Potassium: 3.4 mmol/L — ABNORMAL LOW (ref 3.5–5.1)
SODIUM: 132 mmol/L — AB (ref 135–145)
TOTAL PROTEIN: 6.8 g/dL (ref 6.5–8.1)

## 2017-06-03 LAB — LIPASE, BLOOD: Lipase: 29 U/L (ref 11–51)

## 2017-06-03 LAB — TROPONIN I: Troponin I: <0.03 ng/mL (ref <0.03)

## 2017-06-03 LAB — LACTIC ACID, PLASMA: LACTIC ACID, VENOUS: 0.6 mmol/L (ref 0.5–1.9)

## 2017-06-03 MED ORDER — ONDANSETRON HCL 4 MG/2ML IJ SOLN
4.0000 mg | Freq: Four times a day (QID) | INTRAMUSCULAR | Status: DC | PRN
  Administered 2017-06-04 – 2017-06-05 (×3): 4 mg via INTRAVENOUS
  Filled 2017-06-03: qty 2

## 2017-06-03 MED ORDER — SODIUM CHLORIDE 0.9 % IV SOLN: INTRAVENOUS | Status: DC

## 2017-06-03 MED ORDER — IOPAMIDOL (ISOVUE-300) INJECTION 61%
100.0000 mL | Freq: Once | INTRAVENOUS | Status: AC | PRN
  Administered 2017-06-03: 100 mL via INTRAVENOUS

## 2017-06-03 MED ORDER — ONDANSETRON HCL 4 MG/2ML IJ SOLN
4.0000 mg | Freq: Four times a day (QID) | INTRAMUSCULAR | Status: DC | PRN
  Filled 2017-06-03 (×2): qty 2

## 2017-06-03 MED ORDER — PIPERACILLIN-TAZOBACTAM 3.375 G IVPB 30 MIN
3.3750 g | Freq: Once | INTRAVENOUS | Status: DC
  Filled 2017-06-03: qty 50

## 2017-06-03 MED ORDER — SODIUM CHLORIDE 0.9 % IV SOLN
INTRAVENOUS | Status: AC
  Administered 2017-06-04: via INTRAVENOUS

## 2017-06-03 MED ORDER — FENTANYL CITRATE (PF) 100 MCG/2ML IJ SOLN
50.0000 ug | INTRAMUSCULAR | Status: DC | PRN
  Administered 2017-06-03 – 2017-06-06 (×2): 50 ug via INTRAVENOUS
  Filled 2017-06-03 (×2): qty 2

## 2017-06-03 MED ORDER — LORAZEPAM 0.5 MG PO TABS: 0.5000 mg | ORAL_TABLET | Freq: Four times a day (QID) | ORAL | Status: DC | PRN

## 2017-06-03 MED ORDER — SODIUM CHLORIDE 0.9 % IV BOLUS (SEPSIS): 1000.0000 mL | Freq: Once | INTRAVENOUS | Status: DC

## 2017-06-03 MED ORDER — ONDANSETRON HCL 4 MG/2ML IJ SOLN
4.0000 mg | Freq: Once | INTRAMUSCULAR | Status: AC
  Administered 2017-06-03: 4 mg via INTRAVENOUS
  Filled 2017-06-03: qty 2

## 2017-06-03 MED ORDER — CARVEDILOL 12.5 MG PO TABS: 12.5000 mg | ORAL_TABLET | Freq: Two times a day (BID) | ORAL | Status: DC

## 2017-06-03 MED ORDER — ALBUTEROL SULFATE (2.5 MG/3ML) 0.083% IN NEBU
5.0000 mg | INHALATION_SOLUTION | Freq: Once | RESPIRATORY_TRACT | Status: AC
  Administered 2017-06-03: 5 mg via RESPIRATORY_TRACT
  Filled 2017-06-03: qty 6

## 2017-06-03 MED ORDER — ACETAMINOPHEN 650 MG RE SUPP: 650.0000 mg | Freq: Four times a day (QID) | RECTAL | Status: DC | PRN

## 2017-06-03 MED ORDER — ENOXAPARIN SODIUM 40 MG/0.4ML ~~LOC~~ SOLN
40.0000 mg | Freq: Every day | SUBCUTANEOUS | Status: DC
  Administered 2017-06-04: 40 mg via SUBCUTANEOUS
  Filled 2017-06-03: qty 0.4

## 2017-06-03 MED ORDER — ASPIRIN EC 325 MG PO TBEC
325.0000 mg | DELAYED_RELEASE_TABLET | Freq: Every morning | ORAL | Status: DC
  Administered 2017-06-04: 325 mg via ORAL
  Filled 2017-06-03: qty 1

## 2017-06-03 MED ORDER — ZOLPIDEM TARTRATE 5 MG PO TABS
5.0000 mg | ORAL_TABLET | Freq: Every day | ORAL | Status: DC
  Administered 2017-06-04 – 2017-06-05 (×3): 5 mg via ORAL
  Filled 2017-06-03 (×3): qty 1

## 2017-06-03 MED ORDER — IPRATROPIUM BROMIDE 0.02 % IN SOLN: 0.5000 mg | RESPIRATORY_TRACT | Status: DC

## 2017-06-03 MED ORDER — POTASSIUM CHLORIDE CRYS ER 20 MEQ PO TBCR
40.0000 meq | EXTENDED_RELEASE_TABLET | Freq: Four times a day (QID) | ORAL | Status: AC
  Administered 2017-06-03 (×2): 40 meq via ORAL
  Filled 2017-06-03 (×2): qty 2

## 2017-06-03 MED ORDER — SODIUM CHLORIDE 0.9 % IV BOLUS (SEPSIS)
1000.0000 mL | Freq: Once | INTRAVENOUS | Status: AC
  Administered 2017-06-03: 1000 mL via INTRAVENOUS

## 2017-06-03 MED ORDER — IPRATROPIUM-ALBUTEROL 0.5-2.5 (3) MG/3ML IN SOLN
3.0000 mL | RESPIRATORY_TRACT | Status: DC | PRN
  Administered 2017-06-04: 3 mL via RESPIRATORY_TRACT
  Filled 2017-06-03: qty 3

## 2017-06-03 MED ORDER — FENTANYL CITRATE (PF) 100 MCG/2ML IJ SOLN
50.0000 ug | Freq: Once | INTRAMUSCULAR | Status: AC
  Administered 2017-06-03: 50 ug via INTRAVENOUS
  Filled 2017-06-03: qty 2

## 2017-06-03 MED ORDER — POTASSIUM CHLORIDE IN NACL 40-0.9 MEQ/L-% IV SOLN
INTRAVENOUS | Status: DC
  Filled 2017-06-03 (×2): qty 1000

## 2017-06-03 MED ORDER — ACETAMINOPHEN 325 MG PO TABS: 650.0000 mg | ORAL_TABLET | Freq: Four times a day (QID) | ORAL | Status: DC | PRN

## 2017-06-03 MED ORDER — IPRATROPIUM-ALBUTEROL 0.5-2.5 (3) MG/3ML IN SOLN: 3.0000 mL | RESPIRATORY_TRACT | Status: DC | PRN

## 2017-06-03 MED ORDER — METRONIDAZOLE IN NACL 5-0.79 MG/ML-% IV SOLN
500.0000 mg | Freq: Three times a day (TID) | INTRAVENOUS | Status: DC
  Administered 2017-06-03 – 2017-06-06 (×9): 500 mg via INTRAVENOUS
  Filled 2017-06-03 (×9): qty 100

## 2017-06-03 MED ORDER — IOPAMIDOL (ISOVUE-300) INJECTION 61%: 100.0000 mL | Freq: Once | INTRAVENOUS | Status: DC | PRN

## 2017-06-03 MED ORDER — ALBUTEROL SULFATE (2.5 MG/3ML) 0.083% IN NEBU: 2.5000 mg | INHALATION_SOLUTION | RESPIRATORY_TRACT | Status: DC

## 2017-06-03 MED ORDER — ONDANSETRON HCL 4 MG PO TABS: 4.0000 mg | ORAL_TABLET | Freq: Four times a day (QID) | ORAL | Status: DC | PRN

## 2017-06-03 NOTE — ED Provider Notes (Signed)
TIME SEEN: 12:53 PM  CHIEF COMPLAINT: Diarrhea, shortness of breath, abdominal pain  HPI: Patient is a 56 year old female with history of asthma, CAD, hypertension, hyperlipidemia, ovarian cancer who has had pelvic tumors removed but is not on chemotherapy or radiation who is followed with Dr. Marin Olp but has not been seen in 7 months who presents to the emergency department with diarrhea that started on Friday, December 7 and shortness of breath that started on Saturday, December 8.  She states shortness of breath feels similar to when she has been told she has had bronchitis in the past.  Normally gets better with breathing treatments.  No history of PE or DVT.  No calf swelling or tenderness.  She reports diffuse abdominal pain that is sharp in nature and cramping.  She has had multiple episodes of diarrhea since Friday approximately once every 15 minutes.  No bloody stools or melena.  Has had nausea but no vomiting.  No chest pain or chest discomfort.  Denies sick contacts, recent travel.  Has been on antibiotics for 1 dose for recent dental work on December 6.  States she took 1 dose of amoxicillin.  She denies ever having a C. difficile before.  Denies any known fever.  ROS: See HPI Constitutional: no fever  Eyes: no drainage  ENT: no runny nose   Cardiovascular:  no chest pain  Resp: no SOB  GI: no vomiting; + diarrhea GU: no dysuria Integumentary: no rash  Allergy: no hives  Musculoskeletal: no leg swelling  Neurological: no slurred speech ROS otherwise negative  PAST MEDICAL HISTORY/PAST SURGICAL HISTORY:  Past Medical History:  Diagnosis Date  . Arrhythmia    ventricular tachycardia  . Arthritis 02/06/2012   "everywhere"  . Asthma    as a child  . Automatic implantable cardioverter-defibrillator in situ    DR. Beckie Salts   . Bladder cancer [facility]) 2009   "injected medicine to get rid of it"  . CHF (congestive heart failure) ([place])   . Coronary artery disease    DR. Linard Millers   . 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 High cholesterol   . Hypothyroidism   . ICD (implantable cardiac defibrillator) in place   . Myocardial infarction ([place]) 2011  . Neuropathy    FEET AND HANDS - FROM CHEMO - BUT MUCH IMPROVED  . Pacemaker   . Pain    JOINT PAINS AND MUSCLE ACHES ALL OVER.  Marland Kitchen Pneumonia    as child  . Port-A-Cath in place    RIGHT UPPER CHEST  . Unspecified systolic heart failure   . Ventricular fibrillation (HCC)     MEDICATIONS:  Prior to Admission medications   Medication Sig Start Date End Date Taking? Authorizing Provider  albuterol (PROVENTIL HFA;VENTOLIN HFA) 108 (90 Base) MCG/ACT inhaler Inhale 2 puffs into the lungs every 4 (four) hours as needed for wheezing or shortness of breath. 09/02/16   Belva Crome, MD  aspirin 325 MG EC tablet Take 325 mg by mouth every morning. Reported on 10/20/2015    [provider]  b complex vitamins tablet Take 1 tablet by mouth daily.    [provider]  carvedilol (COREG) 12.5 MG tablet TAKE 1 TABLET BY MOUTH 2 TIMES DAILY WITH A MEAL. 02/12/17   Belva Crome, MD  carvedilol (COREG) 12.5 MG tablet Take 1 tablet (12.5 mg total) 2 (two) times daily with a meal by mouth. 05/06/17   Tamala Julian,  Lynnell Dike, MD  cholecalciferol (VITAMIN D-400) 400 UNITS TABS tablet Take 400 Units by mouth 2 (two) times daily.    [provider]  diphenhydrAMINE (BENADRYL) 25 MG tablet Take 25 mg by mouth as needed for itching or allergies. ALLERGIES    [provider]  fish oil-omega-3 fatty acids 1000 MG capsule Take 1 g by mouth 2 (two) times daily.    [provider]  furosemide (LASIX) 20 MG tablet Take 1 tablet (20 mg total) by mouth daily. May take an extra tablet (20 mg) as needed for leg swelling or shortness of breath. 08/27/16   Evans Lance, MD  LORazepam (ATIVAN) 0.5 MG tablet TAKE 1 TABLET BY MOUTH EVERY 6 HOURS AS NEEDED FOR NAUSEA AND VOMITING 05/08/17    Volanda Napoleon, MD  megestrol (MEGACE) 40 MG tablet  03/14/16   [provider]  Multiple Vitamins-Minerals (MULTIVITAMIN PO) Take 1 tablet by mouth daily.     [provider]  spironolactone (ALDACTONE) 25 MG tablet TAKE 1 TABLET (25 MG TOTAL) BY MOUTH 2 (TWO) TIMES DAILY. 11/11/16   Belva Crome, MD  tamoxifen (NOLVADEX) 20 MG tablet  03/14/16   [provider]  Vitamin D, Ergocalciferol, (DRISDOL) 50000 units CAPS capsule TAKE 1 CAPSULE (50,000 UNITS TOTAL) BY MOUTH EVERY 7 (SEVEN) DAYS. 08/12/16   Volanda Napoleon, MD  zolpidem (AMBIEN) 5 MG tablet TAKE ONE TABLET BY MOUTH AT BEDTIME 05/12/17   Volanda Napoleon, MD    ALLERGIES:  Allergies  Allergen Reactions  . Prochlorperazine Edisylate Other (See Comments)    Nervous/ flutter/ shakes--compazine  . Adhesive [Tape] Other (See Comments)    Redness/skin peeling Redness/hives from adhesive tape( tolerates latex gloves); tolerates paper tape  . Prochlorperazine Other (See Comments)    Jitters, hyper    SOCIAL HISTORY:  Social History   Tobacco Use  . Smoking status: Current Some Day Smoker    Packs/day: 0.50    Years: 30.00    Pack years: 15.00    Types: Cigarettes    Start date: 03/28/1974  . Smokeless tobacco: Never Used  . Tobacco comment:  10-27-13  still smoking daily  Substance Use Topics  . Alcohol use: No    Alcohol/week: 0.0 oz    FAMILY HISTORY: Family History  Problem Relation Age of Onset  . Heart attack Paternal Grandfather   . Heart attack Maternal Grandfather   . Stroke Maternal Grandmother   . Heart attack Brother     EXAM: BP (!) 102/59 (BP Location: Left Arm)   Pulse 92   Temp 98.1 F (36.7 C) (Oral)   Resp 20   SpO2 96%  CONSTITUTIONAL: Alert and oriented and responds appropriately to questions.  Appears pale and uncomfortable but is afebrile and nontoxic HEAD: Normocephalic EYES: Conjunctivae clear, pupils appear equal, EOMI ENT: normal nose; dry mucous  membranes NECK: Supple, no meningismus, no nuchal rigidity, no LAD  CARD: RRR; S1 and S2 appreciated; no murmurs, no clicks, no rubs, no gallops RESP: Normal chest excursion without splinting or tachypnea; breath sounds clear and equal bilaterally; no wheezes, no rhonchi, no rales, no hypoxia or respiratory distress, speaking full sentences ABD/GI: Normal bowel sounds; non-distended; soft, diffusely tender to palpation, no rebound, no guarding, no peritoneal signs, no hepatosplenomegaly BACK:  The back appears normal and is non-tender to palpation, there is no CVA tenderness EXT: Normal ROM in all joints; non-tender to palpation; no edema; normal capillary refill; no cyanosis, no calf  tenderness or swelling    SKIN: Normal color for age and race; warm; no rash NEURO: Moves all extremities equally PSYCH: The patient's mood and manner are appropriate. Grooming and personal hygiene are appropriate.  MEDICAL DECISION MAKING: Patient here with diarrhea and abdominal pain.  Differential diagnosis includes colitis, appendicitis, bowel obstruction, diverticulitis.  Will obtain labs, urine and a CT of the abdomen pelvis.  Will send stool cultures.  Will give IV fluids, pain and nausea medicine.  Patient also complains of shortness of breath.  Her lungs are currently clear but she states this feels similar to when she has had bronchitis before.  Has history of asthma.  Will give nebulizer treatment and reassess.  EKG shows no ischemic changes.  No chest pain.  Doubt ACS or dissection.  She does have risk factors for PE but is not tachycardic, hypoxic or tachypneic here.  Will obtain chest x-ray.  ED PROGRESS: Patient reports feeling better after breathing treatment.  Chest x-ray shows bronchitic changes with no acute abnormality.  No pneumonia or CHF.  She has a soft tissue density noted on CT scan in June 2018 is not as evident today on the chest x-ray.  Troponin negative.  CT of her abdomen pelvis shows  pancolitis without perforation or abscess.  She does have progression of the multifocal soft tissue nodularity compatible with peritoneal metastasis secondary to her known ovarian cancer.  Have discussed with patient the possibility of admission versus going home with oral antibiotics.  She states that she would prefer admission to the hospital.  I do not feel this is unreasonable.  We will give her IV Zosyn.  Stool cultures are pending.  She does not currently have a primary care provider.  Labs show leukocytosis with left shift.  Mildly elevated creatinine but otherwise normal electrolytes.  4:06 PM Discussed patient's case with hospitalist, Dr. Candiss Norse.  I have recommended admission and patient (and family if present) agree with this plan.  Patient would prefer admission of her discharge home.  Hospitalist will change her IV Zosyn to IV Flagyl.  He would like for me to place admission orders to telemetry, inpatient at [facility].  I reviewed all nursing notes, vitals, pertinent previous records, EKGs, lab and urine results, imaging (as available).    EKG Interpretation  Date/Time:  Tuesday June 03 2017 12:45:21 EST Ventricular Rate:  92 PR Interval:    QRS Duration: 121 QT Interval:  365 QTC Calculation: 452 R Axis:   89 Text Interpretation:  Sinus rhythm Nonspecific intraventricular conduction delay Nonspecific T abnormalities, lateral leads Baseline wander in lead(s) III aVF V2 No significant change since last tracing Confirmed by [employer] Mcjunkins, Cyril Mourning [phone]) on 06/03/2017 1:18:14 PM          Estil Vallee, Delice Bison, DO 06/03/17 1607

## 2017-06-03 NOTE — ED Notes (Signed)
[facility] called with the ready bed [place] [address]

## 2017-06-03 NOTE — ED Notes (Signed)
Report to [facility] at [facility].

## 2017-06-03 NOTE — ED Triage Notes (Addendum)
C/o intermittent diarrhea x 1 week-c/o SOB x 4 days-states her SOB is usually r/t to bronchitis once a year -NAD-pt is pale

## 2017-06-03 NOTE — ED Notes (Signed)
[facility] arrived at 1945, pt transported at 2000.

## 2017-06-03 NOTE — H&P (Signed)
History and Physical    MYRIKAL MESSMER VQM:[id number] DOB: [birth year] DOA: 06/03/2017  PCP: Default, Provider, MD  Patient coming from: Home.  Chief Complaint: Diarrhea and shortness of breath.  HPI: [name] is a 56 y.o. female with known history of nonischemic cardiomyopathy status post AICD placement, tissue mitral valve replacement, paroxysmal atrial fibrillation, chronic kidney disease chronic anemia and history of ovarian cancer presents to the ER at [facility] with complaints of persistent diarrhea multiple episodes for last 4 days.  Last 2 days patient also has been having increasing shortness of breath.  Shortness of breath present even at rest no associated chest pain productive cough fever or chills.  Patient did get 1 dose of amoxicillin prior to her dental appointment last week.  Patient diarrhea is associated with some abdominal discomfort denies any vomiting.  Denies any blood in the diarrhea.  ED Course: In the ER patient had a CT scan of the abdomen which shows pancolitis and also features concerning for peritoneal metastasis.  Stool for C. difficile has been negative.  GI pathogen panel is pending.  Patient was given fluids in the ER and also potassium replacement due to hypokalemia.  Patient shortness of breath patient feels that the symptoms happen when she gets bronchitis and was given nebulizer following which it improved but at the time of my exam patient also has mild shortness of breath.  Patient is being admitted for persistent diarrhea and shortness of breath.  EKG shows normal sinus rhythm troponin was negative.  Review of Systems: As per HPI, rest all negative.   Past Medical History:  Diagnosis Date  . Arrhythmia    ventricular tachycardia  . Arthritis 02/06/2012   "everywhere"  . Asthma    as a child  . Automatic implantable cardioverter-defibrillator in situ    DR. Beckie Salts   . Bladder cancer [facility]) 2009   "injected medicine to get  rid of it"  . CHF (congestive heart failure) ([place])   . Coronary artery disease    DR. Linard Millers  . 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 High cholesterol   . Hypothyroidism   . ICD (implantable cardiac defibrillator) in place   . Myocardial infarction ([place]) 2011  . Neuropathy    FEET AND HANDS - FROM CHEMO - BUT MUCH IMPROVED  . Pacemaker   . Pain    JOINT PAINS AND MUSCLE ACHES ALL OVER.  Marland Kitchen Pneumonia    as child  . Port-A-Cath in place    RIGHT UPPER CHEST  . Unspecified systolic heart failure   . Ventricular fibrillation [facility])     Past Surgical History:  Procedure Laterality Date  . ABDOMINAL HYSTERECTOMY N/A 07/27/2013   Procedure: TUMOR EXCISION OF ABDOMINAL MASS;  Surgeon: Alvino Chapel, MD;  Location: [facility] ORS;  Service: Gynecology;  Laterality: N/A;  . APPENDECTOMY  1989  . BILATERAL OOPHORECTOMY  2007  . CARDIAC DEFIBRILLATOR PLACEMENT  02/06/2012   "lead change"  . CARDIAC VALVE REPLACEMENT    . CYSTOSCOPY Right 02/23/2013   Procedure: CYSTOSCOPY WITH STENT REMOVAL;  Surgeon: Alvino Chapel, MD;  Location: [facility] ORS;  Service: Gynecology;  Laterality: Right;  . IMPLANTABLE CARDIOVERTER DEFIBRILLATOR REVISION N/A 02/06/2012   Procedure: IMPLANTABLE CARDIOVERTER DEFIBRILLATOR REVISION;  Surgeon: Evans Lance, MD;  Location: [facility] CATH LAB;  Service: Cardiovascular;  Laterality: N/A;  . INSERT / REPLACE / REMOVE PACEMAKER  10/2009  [name]  . LAPAROTOMY N/A 01/19/2013   Procedure: TUMOR DEBULKING / BOWEL RESECTION/INSERTION RIGHT UTERERAL DOUBLE J STENT/OMENTECTOMY;  Surgeon: Alvino Chapel, MD;  Location: [facility]S;  Service: Gynecology;  Laterality: N/A;  . MITRAL VALVE REPLACEMENT  2011   "pig valve"  . THYMUS REMOVED    . TONSILLECTOMY AND ADENOIDECTOMY  ~ 1967  . TOTAL HIP ARTHROPLASTY  05/06/2012   Procedure: TOTAL HIP ARTHROPLASTY;  Surgeon: Gearlean Alf, MD;  Location: [facility] ORS;  Service:  Orthopedics;  Laterality: Right;  . TRANSURETHRAL RESECTION OF BLADDER  2009   "for bladder cancer"  . TUBAL LIGATION  1993  . VAGINAL HYSTERECTOMY  2001     reports that she has been smoking cigarettes.  She started smoking about 43 years ago. She has a 15.00 pack-year smoking history. she has never used smokeless tobacco. She reports that she does not drink alcohol or use drugs.  Allergies  Allergen Reactions  . Prochlorperazine Edisylate Other (See Comments)    Nervous/ flutter/ shakes--compazine  . Adhesive [Tape] Other (See Comments)    Redness/skin peeling Redness/hives from adhesive tape( tolerates latex gloves); tolerates paper tape  . Prochlorperazine Other (See Comments)    Jitters, hyper    Family History  Problem Relation Age of Onset  . Heart attack Paternal Grandfather   . Heart attack Maternal Grandfather   . Stroke Maternal Grandmother   . Heart attack Brother     Prior to Admission medications   Medication Sig Start Date End Date Taking? Authorizing Provider  albuterol (PROVENTIL HFA;VENTOLIN HFA) 108 (90 Base) MCG/ACT inhaler Inhale 2 puffs into the lungs every 4 (four) hours as needed for wheezing or shortness of breath. 09/02/16  Yes Belva Crome, MD  aspirin 325 MG EC tablet Take 325 mg by mouth every morning. Reported on 10/20/2015   Yes [provider]  b complex vitamins tablet Take 1 tablet by mouth daily.   Yes [provider]  carvedilol (COREG) 12.5 MG tablet TAKE 1 TABLET BY MOUTH 2 TIMES DAILY WITH A MEAL. 02/12/17  Yes Belva Crome, MD  cholecalciferol (VITAMIN D-400) 400 UNITS TABS tablet Take 400 Units by mouth 2 (two) times daily.   Yes [provider]  diphenhydrAMINE (BENADRYL) 25 MG tablet Take 25 mg by mouth as needed for itching or allergies. ALLERGIES   Yes [provider]  furosemide (LASIX) 20 MG tablet Take 1 tablet (20 mg total) by mouth daily. May take an extra tablet (20 mg) as needed for leg  swelling or shortness of breath. 08/27/16  Yes Evans Lance, MD  LORazepam (ATIVAN) 0.5 MG tablet TAKE 1 TABLET BY MOUTH EVERY 6 HOURS AS NEEDED FOR NAUSEA AND VOMITING 05/08/17  Yes Volanda Napoleon, MD  Multiple Vitamins-Minerals (MULTIVITAMIN PO) Take 1 tablet by mouth daily.    Yes [provider]  spironolactone (ALDACTONE) 25 MG tablet TAKE 1 TABLET (25 MG TOTAL) BY MOUTH 2 (TWO) TIMES DAILY. 11/11/16  Yes Belva Crome, MD  Vitamin D, Ergocalciferol, (DRISDOL) 50000 units CAPS capsule TAKE 1 CAPSULE (50,000 UNITS TOTAL) BY MOUTH EVERY 7 (SEVEN) DAYS. 08/12/16  Yes Ennever, Rudell Cobb, MD  zolpidem (AMBIEN) 5 MG tablet TAKE ONE TABLET BY MOUTH AT BEDTIME 05/12/17  Yes Ennever, Rudell Cobb, MD  carvedilol (COREG) 12.5 MG tablet Take 1 tablet (12.5 mg total) 2 (two) times daily with a meal by mouth. Patient not taking: Reported on 06/03/2017 05/06/17   Tamala Julian,  Lynnell Dike, MD    Physical Exam: Vitals:   06/03/17 1800 06/03/17 1932 06/03/17 2126 06/03/17 2127  BP: 102/75 114/73 108/72   Pulse: 99 100 (!) 103   Resp: 19  19   Temp:   99.1 F (37.3 C)   TempSrc:   Oral   SpO2: 94% 96% 97%   Weight:    91.2 kg (201 lb)  Height:    5\' 6"  (1.676 m)      Constitutional: Moderately built and nourished. Vitals:   06/03/17 1800 06/03/17 1932 06/03/17 2126 06/03/17 2127  BP: 102/75 114/73 108/72   Pulse: 99 100 (!) 103   Resp: 19  19   Temp:   99.1 F (37.3 C)   TempSrc:   Oral   SpO2: 94% 96% 97%   Weight:    91.2 kg (201 lb)  Height:    5\' 6"  (1.676 m)   Eyes: Anicteric no pallor. ENMT: No discharge from the ears eyes nose or mouth. Neck: No mass felt.  No JVD appreciated. Respiratory: No rhonchi or crepitations. Cardiovascular: S1-S2 heard no murmurs appreciated. Abdomen: Soft nontender bowel sounds present. Musculoskeletal: No edema.  No joint effusion. Skin: No rash. Neurologic: Alert awake oriented to time place and person.  Moves all extremities. Psychiatric: Appears  normal.  Normal affect.   Labs on Admission: I have personally reviewed following labs and imaging studies  CBC: Recent Labs  Lab 06/03/17 1241  WBC 15.1*  NEUTROABS 12.0*  HGB 11.9*  HCT 34.5*  MCV 92.5  PLT 027   Basic Metabolic Panel: Recent Labs  Lab 06/03/17 1241  NA 132*  K 3.4*  CL 98*  CO2 24  GLUCOSE 98  BUN 18  CREATININE 1.23*  CALCIUM 8.6*   GFR: Estimated Creatinine Clearance: 57.4 mL/min (A) (by C-G formula based on SCr of 1.23 mg/dL (H)). Liver Function Tests: Recent Labs  Lab 06/03/17 1241  AST 23  ALT 19  ALKPHOS 61  BILITOT 0.7  PROT 6.8  ALBUMIN 3.4*   Recent Labs  Lab 06/03/17 1241  LIPASE 29   No results for input(s): AMMONIA in the last 168 hours. Coagulation Profile: No results for input(s): INR, PROTIME in the last 168 hours. Cardiac Enzymes: Recent Labs  Lab 06/03/17 1241  TROPONINI <0.03   BNP (last 3 results) No results for input(s): PROBNP in the last 8760 hours. HbA1C: No results for input(s): HGBA1C in the last 72 hours. CBG: No results for input(s): GLUCAP in the last 168 hours. Lipid Profile: No results for input(s): CHOL, HDL, LDLCALC, TRIG, CHOLHDL, LDLDIRECT in the last 72 hours. Thyroid Function Tests: No results for input(s): TSH, T4TOTAL, FREET4, T3FREE, THYROIDAB in the last 72 hours. Anemia Panel: No results for input(s): VITAMINB12, FOLATE, FERRITIN, TIBC, IRON, RETICCTPCT in the last 72 hours. Urine analysis:    Component Value Date/Time   COLORURINE YELLOW 04/30/2012 1231   APPEARANCEUR CLOUDY (A) 04/30/2012 1231   LABSPEC 1.015 02/15/2013 1036   PHURINE 6.5 02/15/2013 1036   PHURINE 5.5 04/30/2012 1231   GLUCOSEU Negative 02/15/2013 1036   HGBUR Large 02/15/2013 1036   HGBUR NEGATIVE 04/30/2012 1231   BILIRUBINUR Negative 02/15/2013 1036   KETONESUR Negative 02/15/2013 1036   KETONESUR NEGATIVE 04/30/2012 1231   PROTEINUR 300 02/15/2013 1036   PROTEINUR NEGATIVE 04/30/2012 1231    UROBILINOGEN 0.2 02/15/2013 1036   NITRITE Positive 02/15/2013 1036   NITRITE NEGATIVE 04/30/2012 1231   LEUKOCYTESUR Moderate 02/15/2013 1036   Sepsis Labs: @  LABRCNTIP(procalcitonin:4,lacticidven:4) ) Recent Results (from the past 240 hour(s))  C difficile quick scan w PCR reflex     Status: None   Collection Time: 06/03/17  2:29 PM  Result Value Ref Range Status   C Diff antigen NEGATIVE NEGATIVE Final   C Diff toxin NEGATIVE NEGATIVE Final   C Diff interpretation No C. difficile detected.  Final     Radiological Exams on Admission: Dg Chest 2 View  Result Date: 06/03/2017 CLINICAL DATA:  Four days of shortness of breath. Current smoker. History of coronary artery disease and previous MI, childhood asthma, CHF, mitral valve replacement. 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 EXAM: CHEST  2 VIEW COMPARISON:  Chest x-ray of April 29, 2016 and CT scan chest of December 06, 2016. FINDINGS: The lungs are well-expanded. There is no focal infiltrate. There is no pleural effusion. No pulmonary parenchymal masses are observed. The heart and pulmonary vascularity are normal. The patient has undergone previous mitral valve replacement. The ICD is in stable position. There is calcification in the wall of the aortic arch. There is no pleural effusion. The observed bony thorax exhibits no acute abnormality. IMPRESSION: Mild chronic bronchitic changes, stable. No pneumonia, CHF, nor other acute cardiopulmonary abnormality. Known soft tissue density pulmonary parenchymal masses seen on the CT scan of June 2018 are not clearly evident on today's study. Thoracic aortic atherosclerosis. Electronically Signed   By: David  Martinique M.D.   On: 06/03/2017 11:56   Ct Abdomen Pelvis W Contrast  Result Date: 06/03/2017 CLINICAL DATA:  Abdominal pain and diarrhea. EXAM: CT ABDOMEN AND PELVIS WITH CONTRAST TECHNIQUE: Multidetector CT imaging of the abdomen and pelvis was performed using the standard  protocol following bolus administration of intravenous contrast. CONTRAST:  100 cc of Omni 300 COMPARISON:  12/06/2016 FINDINGS: Lower chest: Mild interstitial prominence is identified within the lung bases bilaterally. No pleural effusion. Hepatobiliary: 1.2 cm low-attenuation structure and caudate lobe of liver is unchanged from 2016 and likely represents a small cyst. No suspicious liver abnormalities. The gallbladder appears tiny stones within the dependent portion of the gallbladder suspected. No gallbladder wall thickening. No biliary dilatation. Pancreas: Unremarkable. No pancreatic ductal dilatation or surrounding inflammatory changes. Spleen: Normal in size without focal abnormality. Adrenals/Urinary Tract: The adrenal glands appear within normal limits. Unremarkable appearance of both kidneys. No mass or hydronephrosis. Urinary bladder unremarkable. Stomach/Bowel: Small hiatal hernia. The small bowel loops have a normal course and caliber. There is abnormal wall thickening and inflammation involving the entire colon up to the level of the rectum compatible with pancolitis. No pneumatosis or bowel perforation. No abscess identified. Vascular/Lymphatic: Aortic atherosclerosis. No aneurysm. Small retroperitoneal lymph nodes identified but no adenopathy. Reproductive: Previous hysterectomy.  No adnexal mass. Other: Multiple peritoneal nodules are identified and appear progressive when compared with previous exam. Adjacent to the ascending colon is a soft tissue nodule measuring 3.1 cm wall, image 55 of series 2. Previously 0.8 cm. Mass in the right side of pelvis measures, image 72 of series 2. Previously 3.9 cm. Central pelvic soft tissue lesion measures 4.1 cm, image 75 of series 2. Previously 3.7 cm. Left posterior pelvis nodule measures 2.7 cm, image 74 of series 2. Previously 2.5 cm. Musculoskeletal: Previous right total hip arthroplasty. No aggressive lytic or sclerotic bone lesions. IMPRESSION: 1.  Examination is stool ball positive for pancolitis. No pneumatosis, perforation or abscess. 2. Progression of multifocal soft tissue nodularity compatible with peritoneal metastasis secondary to known ovarian cancer. 3.  Aortic Atherosclerosis (ICD10-I70.0). Electronically Signed   By: Kerby Moors M.D.   On: 06/03/2017 15:31    EKG: Independently reviewed.  Normal sinus rhythm.  Assessment/Plan Principal Problem:   Pancolitis (HCC) Active Problems:   Athscl autologous vein CABG w oth angina pectoris (HCC)   Chronic systolic HF (heart failure) (HCC)   S/P MVR (mitral valve replacement)   Granulosa cell tumor of ovary   Acute bronchitis   CKD (chronic kidney disease) stage 3, GFR 30-59 ml/min (HCC)   COPD (chronic obstructive pulmonary disease) ([place])    1. Diarrhea with pancolitis -stool for C. difficile was negative.  GI pathogen panel is pending.  Check lactic acid to rule out any ischemic process.  Patient is placed on Cipro and Flagyl.  Patient did receive fluids but since patient is mildly short of breath and with history of nonischemic cardiomyopathy we will hold on any further fluids. 2. Shortness of breath -unclear of the cause.  Will check BNP d-dimer and since patient also has mitral valve replacement and nonischemic cardia myopathy will check 2D echo.  If d-dimer is positive will get VQ scan.  Requested cardiology input.  Since patient did have relief with nebulizer we will continue with that. 3. Hypokalemia likely from diarrhea -replace and recheck. 4. History of nonischemic cardiomyopathy last EF measured in 2016 was 45-50% status post AICD placement -Lasix and spironolactone is on hold due to diarrhea and low normal blood pressure.  Once blood pressure improves may need to restart these medications. 5. History of ovarian cancer with CT scan showing peritoneal metastasis -will need to get [name] consult in the morning. 6. Chronic kidney disease stage III -creatinine appears  to be at baseline. 7. Chronic anemia likely from renal disease -follow CBC.  I have reviewed patient's old charts and labs.   DVT prophylaxis: Lovenox. Code Status: Full code. Family Communication: Discussed with patient. Disposition Plan: Home. Consults called: Cardiology. Admission status: Inpatient.   Rise Patience MD [facility] Pager [phone].  If 7PM-7AM, please contact night-coverage [email or web address] Password [facility]  06/03/2017, 10:44 PM

## 2017-06-04 ENCOUNTER — Inpatient Hospital Stay (HOSPITAL_COMMUNITY): Payer: BLUE CROSS/BLUE SHIELD

## 2017-06-04 ENCOUNTER — Encounter (HOSPITAL_COMMUNITY): Payer: Self-pay | Admitting: Physician Assistant

## 2017-06-04 DIAGNOSIS — I5022 Chronic systolic (congestive) heart failure: Secondary | ICD-10-CM

## 2017-06-04 DIAGNOSIS — K5100 Ulcerative (chronic) pancolitis without complications: Principal | ICD-10-CM

## 2017-06-04 DIAGNOSIS — N183 Chronic kidney disease, stage 3 (moderate): Secondary | ICD-10-CM

## 2017-06-04 DIAGNOSIS — I342 Nonrheumatic mitral (valve) stenosis: Secondary | ICD-10-CM

## 2017-06-04 DIAGNOSIS — R0600 Dyspnea, unspecified: Secondary | ICD-10-CM

## 2017-06-04 DIAGNOSIS — R109 Unspecified abdominal pain: Secondary | ICD-10-CM

## 2017-06-04 DIAGNOSIS — I2729 Other secondary pulmonary hypertension: Secondary | ICD-10-CM

## 2017-06-04 DIAGNOSIS — D3910 Neoplasm of uncertain behavior of unspecified ovary: Secondary | ICD-10-CM

## 2017-06-04 DIAGNOSIS — Z951 Presence of aortocoronary bypass graft: Secondary | ICD-10-CM

## 2017-06-04 DIAGNOSIS — R0602 Shortness of breath: Secondary | ICD-10-CM

## 2017-06-04 LAB — GASTROINTESTINAL PANEL BY PCR, STOOL (REPLACES STOOL CULTURE)

## 2017-06-04 LAB — HEPATIC FUNCTION PANEL
ALBUMIN: 3.2 g/dL — AB (ref 3.5–5.0)
ALT: 19 U/L (ref 14–54)
AST: 24 U/L (ref 15–41)
Alkaline Phosphatase: 61 U/L (ref 38–126)
Bilirubin, Direct: 0.2 mg/dL (ref 0.1–0.5)
Indirect Bilirubin: 0.7 mg/dL (ref 0.3–0.9)
TOTAL PROTEIN: 6.4 g/dL — AB (ref 6.5–8.1)
Total Bilirubin: 0.9 mg/dL (ref 0.3–1.2)

## 2017-06-04 LAB — BASIC METABOLIC PANEL
Anion gap: 11 (ref 5–15)
BUN: 16 mg/dL (ref 6–20)
CALCIUM: 8.2 mg/dL — AB (ref 8.9–10.3)
CO2: 21 mmol/L — ABNORMAL LOW (ref 22–32)
CREATININE: 1.2 mg/dL — AB (ref 0.44–1.00)
Chloride: 101 mmol/L (ref 101–111)
GFR calc non Af Amer: 49 mL/min — ABNORMAL LOW (ref >60)
GFR, EST AFRICAN AMERICAN: 57 mL/min — AB (ref >60)
Glucose, Bld: 84 mg/dL (ref 65–99)
Potassium: 3.8 mmol/L (ref 3.5–5.1)
SODIUM: 133 mmol/L — AB (ref 135–145)

## 2017-06-04 LAB — CBC
HCT: 31.5 % — ABNORMAL LOW (ref 36.0–46.0)
Hemoglobin: 10.6 g/dL — ABNORMAL LOW (ref 12.0–15.0)
MCH: 31.4 pg (ref 26.0–34.0)
MCHC: 33.7 g/dL (ref 30.0–36.0)
MCV: 93.2 fL (ref 78.0–100.0)
PLATELETS: 151 10*3/uL (ref 150–400)
RBC: 3.38 MIL/uL — AB (ref 3.87–5.11)
RDW: 13.6 % (ref 11.5–15.5)
WBC: 14 10*3/uL — AB (ref 4.0–10.5)

## 2017-06-04 LAB — ECHOCARDIOGRAM COMPLETE
Height: 66 in
Weight: 3216 oz

## 2017-06-04 LAB — D-DIMER, QUANTITATIVE (NOT AT ARMC): D DIMER QUANT: 2.08 ug{FEU}/mL — AB (ref 0.00–0.50)

## 2017-06-04 LAB — HIV ANTIBODY (ROUTINE TESTING W REFLEX): HIV Screen 4th Generation wRfx: NONREACTIVE

## 2017-06-04 MED ORDER — TECHNETIUM TO 99M ALBUMIN AGGREGATED
4.3000 | Freq: Once | INTRAVENOUS | Status: AC | PRN
  Administered 2017-06-04: 4.3 via INTRAVENOUS

## 2017-06-04 MED ORDER — TECHNETIUM TC 99M DIETHYLENETRIAME-PENTAACETIC ACID
32.4000 | Freq: Once | INTRAVENOUS | Status: AC | PRN
  Administered 2017-06-04: 32.4 via INTRAVENOUS

## 2017-06-04 MED ORDER — ASPIRIN EC 81 MG PO TBEC
81.0000 mg | DELAYED_RELEASE_TABLET | Freq: Every morning | ORAL | Status: DC
  Administered 2017-06-05 – 2017-06-06 (×2): 81 mg via ORAL
  Filled 2017-06-04 (×2): qty 1

## 2017-06-04 MED ORDER — IPRATROPIUM-ALBUTEROL 0.5-2.5 (3) MG/3ML IN SOLN
3.0000 mL | Freq: Two times a day (BID) | RESPIRATORY_TRACT | Status: DC
  Administered 2017-06-04: 3 mL via RESPIRATORY_TRACT
  Filled 2017-06-04: qty 3

## 2017-06-04 MED ORDER — CIPROFLOXACIN IN D5W 400 MG/200ML IV SOLN
400.0000 mg | Freq: Two times a day (BID) | INTRAVENOUS | Status: DC
  Administered 2017-06-04 – 2017-06-06 (×5): 400 mg via INTRAVENOUS
  Filled 2017-06-04 (×5): qty 200

## 2017-06-04 MED ORDER — ALBUTEROL SULFATE (2.5 MG/3ML) 0.083% IN NEBU
2.5000 mg | INHALATION_SOLUTION | RESPIRATORY_TRACT | Status: DC | PRN
  Administered 2017-06-04 – 2017-06-05 (×2): 2.5 mg via RESPIRATORY_TRACT
  Filled 2017-06-04 (×2): qty 3

## 2017-06-04 MED ORDER — LOPERAMIDE HCL 2 MG PO CAPS
4.0000 mg | ORAL_CAPSULE | Freq: Once | ORAL | Status: AC
  Administered 2017-06-04: 4 mg via ORAL
  Filled 2017-06-04: qty 2

## 2017-06-04 NOTE — Progress Notes (Signed)
PROGRESS NOTE    [name]  JXB:[id number] DOB: 1960/07/11 DOA: 06/03/2017 PCP: Default, Provider, MD    Brief Narrative:  [name] is a 56 y.o. female with known history of nonischemic cardiomyopathy status post AICD placement, tissue mitral valve replacement, paroxysmal atrial fibrillation, chronic kidney disease chronic anemia and history of ovarian cancer presents to the ER at [facility] with complaints of persistent diarrhea multiple episodes for last 4 days.  Last 2 days patient also has been having increasing shortness of breath.  Shortness of breath present even at rest no associated chest pain productive cough fever or chills.  Patient did get 1 dose of amoxicillin prior to her dental appointment last week.  Patient diarrhea is associated with some abdominal discomfort denies any vomiting.  Denies any blood in the diarrhea.  ED Course: In the ER patient had a CT scan of the abdomen which shows pancolitis and also features concerning for peritoneal metastasis.  Stool for C. difficile has been negative.  GI pathogen panel is pending.  Patient was given fluids in the ER and also potassium replacement due to hypokalemia.  Patient shortness of breath patient feels that the symptoms happen when she gets bronchitis and was given nebulizer following which it improved but at the time of my exam patient also has mild shortness of breath.  Patient is being admitted for persistent diarrhea and shortness of breath.  EKG shows normal sinus rhythm troponin was negative.     Assessment & Plan:   Principal Problem:   Pancolitis ([place]) Active Problems:   Dyspnea   Athscl autologous vein CABG w oth angina pectoris (HCC)   Chronic systolic HF (heart failure) (HCC)   S/P MVR (mitral valve replacement)   Granulosa cell tumor of ovary   Acute bronchitis   CKD (chronic kidney disease) stage 3, GFR 30-59 ml/min (HCC)   COPD (chronic obstructive pulmonary disease)  (HCC)   #1 pancolitis Questionable etiology.  Patient presented with diarrhea and abdominal discomfort CT abdomen and pelvis consistent with a pancolitis.  CT abdomen and pelvis also consistent with peritoneal metastases.  Patient still with some abdominal pain and some diarrhea.  Patient with a leukocytosis.  Currently afebrile.  He did PCR negative.  GI pathogen panel negative.  Continue empiric IV ciprofloxacin and IV Flagyl.  Gastroenterology has been consulted following.  2.  Dyspnea/shortness of breath Likely secondary to worsening pulmonary hypertension and mitral stenosis as noted on 2D echo which showed a depressed EF of 35-40% from previous 45-50% with global hypokinesia.  Degenerated bioprosthetic mitral valve with severe stenosis of mild regurgitation.  Mean gradient of 21 mmHg up from 16 mmHg.  Moderate pulmonary hypertension with a gradient of 65 mmHg.  VQ scan done with low probability for PE.  Patient has been assessed by cardiology who feel patient will ultimately need a redo MVR and possible redo CABG however will likely need a right and left heart cardiac catheterization prior to that.  Cardiology try to estimate patient's oncological prognosis to see how aggressive they can be with patient's cardiac treatment.  Patient's oncologist has been notified via epic of patient's admission.  3.  Hypokalemia Likely secondary to GI losses.  Repleted.  Check a magnesium level in the morning.  Follow.  4.  History of nonischemic cardiomyopathy/status post AICD placement See problem #2.  This is a spironolactone on hold due to patient's diarrhea and soft blood pressure.  Follow for now.  Blood pressure improves  these will be resumed.  Cardiology following.  5.  Chronic kidney disease stage III Stable.  6.  History of ovarian cancer with CT scan showing peritoneal metastases Patient's oncologist, Dr.Ennever notified via epic of patient's admission.  7.  Chronic anemia H&H stable.   Follow.    DVT prophylaxis: Lovenox Code Status: Full Family Communication: Updated patient.  No family at bedside. Disposition Plan: Once medically stable with clinical improvement.   Consultants:   Cardiology: Dr. Meda Coffee 06/05/1999  Gastroenterology Dr. Therisa Doyne 06/04/2017  Procedures:   VQ scan 06/04/2017  CT abdomen and pelvis 06/03/2017  Chest X-ray 06/03/2017  2D echo 06/04/2017    Antimicrobials:  IV ciprofloxacin 06/04/2017  IV Flagyl 06/03/2017  IV Zosyn 06/03/2017>>>> 06/03/2017   Subjective: Patient in bed about to go for VQ scan.  Patient states some improvement with shortness of breath since admission.  Patient states abdominal pain slowly improving however not at baseline.  No chest pain.  Still with diarrhea.  Objective: Vitals:   06/04/17 0514 06/04/17 0841 06/04/17 0845 06/04/17 1443  BP: 98/70   113/69  Pulse: 99   (!) 102  Resp: 18   18  Temp: 98 F (36.7 C)   97.7 F (36.5 C)  TempSrc: Oral   Oral  SpO2: 93% 94% 94% 95%  Weight:      Height:        Intake/Output Summary (Last 24 hours) at 06/04/2017 1624 Last data filed at 06/04/2017 0600 Gross per 24 hour  Intake 256.25 ml  Output -  Net 256.25 ml   Filed Weights   06/03/17 2127  Weight: 91.2 kg (201 lb)    Examination:  General exam: Appears calm and comfortable  Respiratory system: Clear to auscultation. Respiratory effort normal. Cardiovascular system: S1 & S2 heard, RRR. No JVD, murmurs, rubs, gallops or clicks. No pedal edema. Gastrointestinal system: Abdomen is nondistended, soft and less tender to palpation.  Positive bowel sounds.  Central nervous system: Alert and oriented. No focal neurological deficits. Extremities: Symmetric 5 x 5 power. Skin: No rashes, lesions or ulcers Psychiatry: Judgement and insight appear normal. Mood & affect appropriate.     Data Reviewed: I have personally reviewed following labs and imaging studies  CBC: Recent Labs  Lab  06/03/17 1241 06/03/17 2311 06/04/17 0047  WBC 15.1* 14.8* 14.0*  NEUTROABS 12.0*  --   --   HGB 11.9* 10.9* 10.6*  HCT 34.5* 32.0* 31.5*  MCV 92.5 92.8 93.2  PLT 151 155 220   Basic Metabolic Panel: Recent Labs  Lab 06/03/17 1241 06/03/17 2311 06/04/17 0047  NA 132*  --  133*  K 3.4*  --  3.8  CL 98*  --  101  CO2 24  --  21*  GLUCOSE 98  --  84  BUN 18  --  16  CREATININE 1.23* 1.26* 1.20*  CALCIUM 8.6*  --  8.2*   GFR: Estimated Creatinine Clearance: 58.9 mL/min (A) (by C-G formula based on SCr of 1.2 mg/dL (H)). Liver Function Tests: Recent Labs  Lab 06/03/17 1241 06/04/17 0047  AST 23 24  ALT 19 19  ALKPHOS 61 61  BILITOT 0.7 0.9  PROT 6.8 6.4*  ALBUMIN 3.4* 3.2*   Recent Labs  Lab 06/03/17 1241  LIPASE 29   No results for input(s): AMMONIA in the last 168 hours. Coagulation Profile: No results for input(s): INR, PROTIME in the last 168 hours. Cardiac Enzymes: Recent Labs  Lab 06/03/17 1241  TROPONINI <0.03  BNP (last 3 results) No results for input(s): PROBNP in the last 8760 hours. HbA1C: No results for input(s): HGBA1C in the last 72 hours. CBG: No results for input(s): GLUCAP in the last 168 hours. Lipid Profile: No results for input(s): CHOL, HDL, LDLCALC, TRIG, CHOLHDL, LDLDIRECT in the last 72 hours. Thyroid Function Tests: No results for input(s): TSH, T4TOTAL, FREET4, T3FREE, THYROIDAB in the last 72 hours. Anemia Panel: No results for input(s): VITAMINB12, FOLATE, FERRITIN, TIBC, IRON, RETICCTPCT in the last 72 hours. Sepsis Labs: Recent Labs  Lab 06/03/17 2311  LATICACIDVEN 0.6    Recent Results (from the past 240 hour(s))  C difficile quick scan w PCR reflex     Status: None   Collection Time: 06/03/17  2:29 PM  Result Value Ref Range Status   C Diff antigen NEGATIVE NEGATIVE Final   C Diff toxin NEGATIVE NEGATIVE Final   C Diff interpretation No C. difficile detected.  Final  Gastrointestinal Panel by PCR , Stool      Status: None   Collection Time: 06/03/17  2:29 PM  Result Value Ref Range Status   Campylobacter species NOT DETECTED NOT DETECTED Final   Plesimonas shigelloides NOT DETECTED NOT DETECTED Final   Salmonella species NOT DETECTED NOT DETECTED Final   Yersinia enterocolitica NOT DETECTED NOT DETECTED Final   Vibrio species NOT DETECTED NOT DETECTED Final   Vibrio cholerae NOT DETECTED NOT DETECTED Final   Enteroaggregative E coli (EAEC) NOT DETECTED NOT DETECTED Final   Enteropathogenic E coli (EPEC) NOT DETECTED NOT DETECTED Final   Enterotoxigenic E coli (ETEC) NOT DETECTED NOT DETECTED Final   Shiga like toxin producing E coli (STEC) NOT DETECTED NOT DETECTED Final   Shigella/Enteroinvasive E coli (EIEC) NOT DETECTED NOT DETECTED Final   Cryptosporidium NOT DETECTED NOT DETECTED Final   Cyclospora cayetanensis NOT DETECTED NOT DETECTED Final   Entamoeba histolytica NOT DETECTED NOT DETECTED Final   Giardia lamblia NOT DETECTED NOT DETECTED Final   Adenovirus F40/41 NOT DETECTED NOT DETECTED Final   Astrovirus NOT DETECTED NOT DETECTED Final   Norovirus GI/GII NOT DETECTED NOT DETECTED Final   Rotavirus A NOT DETECTED NOT DETECTED Final   Sapovirus (I, II, IV, and V) NOT DETECTED NOT DETECTED Final         Radiology Studies: Dg Chest 2 View  Result Date: 06/03/2017 CLINICAL DATA:  Four days of shortness of breath. Current smoker. History of coronary artery disease and previous MI, childhood asthma, CHF, mitral valve replacement. 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 EXAM: CHEST  2 VIEW COMPARISON:  Chest x-ray of April 29, 2016 and CT scan chest of December 06, 2016. FINDINGS: The lungs are well-expanded. There is no focal infiltrate. There is no pleural effusion. No pulmonary parenchymal masses are observed. The heart and pulmonary vascularity are normal. The patient has undergone previous mitral valve replacement. The ICD is in stable position. There is  calcification in the wall of the aortic arch. There is no pleural effusion. The observed bony thorax exhibits no acute abnormality. IMPRESSION: Mild chronic bronchitic changes, stable. No pneumonia, CHF, nor other acute cardiopulmonary abnormality. Known soft tissue density pulmonary parenchymal masses seen on the CT scan of June 2018 are not clearly evident on today's study. Thoracic aortic atherosclerosis. Electronically Signed   By: David  Martinique M.D.   On: 06/03/2017 11:56   Ct Abdomen Pelvis W Contrast  Result Date: 06/03/2017 CLINICAL DATA:  Abdominal pain and diarrhea. EXAM: CT ABDOMEN AND  PELVIS WITH CONTRAST TECHNIQUE: Multidetector CT imaging of the abdomen and pelvis was performed using the standard protocol following bolus administration of intravenous contrast. CONTRAST:  100 cc of Omni 300 COMPARISON:  12/06/2016 FINDINGS: Lower chest: Mild interstitial prominence is identified within the lung bases bilaterally. No pleural effusion. Hepatobiliary: 1.2 cm low-attenuation structure and caudate lobe of liver is unchanged from 2016 and likely represents a small cyst. No suspicious liver abnormalities. The gallbladder appears tiny stones within the dependent portion of the gallbladder suspected. No gallbladder wall thickening. No biliary dilatation. Pancreas: Unremarkable. No pancreatic ductal dilatation or surrounding inflammatory changes. Spleen: Normal in size without focal abnormality. Adrenals/Urinary Tract: The adrenal glands appear within normal limits. Unremarkable appearance of both kidneys. No mass or hydronephrosis. Urinary bladder unremarkable. Stomach/Bowel: Small hiatal hernia. The small bowel loops have a normal course and caliber. There is abnormal wall thickening and inflammation involving the entire colon up to the level of the rectum compatible with pancolitis. No pneumatosis or bowel perforation. No abscess identified. Vascular/Lymphatic: Aortic atherosclerosis. No aneurysm. Small  retroperitoneal lymph nodes identified but no adenopathy. Reproductive: Previous hysterectomy.  No adnexal mass. Other: Multiple peritoneal nodules are identified and appear progressive when compared with previous exam. Adjacent to the ascending colon is a soft tissue nodule measuring 3.1 cm wall, image 55 of series 2. Previously 0.8 cm. Mass in the right side of pelvis measures, image 72 of series 2. Previously 3.9 cm. Central pelvic soft tissue lesion measures 4.1 cm, image 75 of series 2. Previously 3.7 cm. Left posterior pelvis nodule measures 2.7 cm, image 74 of series 2. Previously 2.5 cm. Musculoskeletal: Previous right total hip arthroplasty. No aggressive lytic or sclerotic bone lesions. IMPRESSION: 1. Examination is stool ball positive for pancolitis. No pneumatosis, perforation or abscess. 2. Progression of multifocal soft tissue nodularity compatible with peritoneal metastasis secondary to known ovarian cancer. 3.  Aortic Atherosclerosis (ICD10-I70.0). Electronically Signed   By: Kerby Moors M.D.   On: 06/03/2017 15:31   Nm Pulmonary Perf And Vent  Result Date: 06/04/2017 CLINICAL DATA:  Shortness of Breath EXAM: NUCLEAR MEDICINE VENTILATION - PERFUSION LUNG SCAN VIEWS: Anterior, posterior, left lateral, right lateral, RPO, LPO, RAO, LAO -ventilation and perfusion RADIOPHARMACEUTICALS:  32.4 mCi Technetium-62m DTPA aerosol inhalation and 4.3 mCi Technetium-37m MAA IV COMPARISON:  Chest radiograph June 03, 2017 FINDINGS: Ventilation: There are multiple subsegmental ventilation defects, slightly more on the right than on the left. No segmental ventilation defects are evident. Perfusion: There are scattered subsegmental defects, largest involving a portion of the anterior segment of the left upper lobe. This defect matches the ventilation study. There are fewer perfusion defects than ventilation defects. There is no appreciable perfusion defect disproportionate to ventilation defects.  IMPRESSION: Scattered subsegmental defects, more notable on the ventilation and perfusion study. Largest defect on the perfusion study is in the left upper lobe which matches the ventilation defect. There is no significant ventilation/perfusion mismatch. This study constitutes an overall low probability of pulmonary embolus based on PIOPED II guidelines. Electronically Signed   By: Lowella Grip III M.D.   On: 06/04/2017 14:23        Scheduled Meds: . [START ON 06/05/2017] aspirin  81 mg Oral q morning - 10a  . enoxaparin (LOVENOX) injection  40 mg Subcutaneous QHS  . zolpidem  5 mg Oral QHS   Continuous Infusions: . sodium chloride Stopped (06/04/17 0054)  . ciprofloxacin Stopped (06/04/17 1200)  . metronidazole 500 mg (06/04/17 1600)  LOS: 1 day    Time spent: 40 mins    Irine Seal, MD [facility] Pager [phone]  If 7PM-7AM, please contact night-coverage [email or web address] Password [facility] [place] 06/04/2017, 4:24 PM

## 2017-06-04 NOTE — Progress Notes (Signed)
  Echocardiogram 2D Echocardiogram has been performed.  Darlina Sicilian M 06/04/2017, 9:45 AM

## 2017-06-04 NOTE — Consult Note (Signed)
Cardiology Consultation:   Patient ID: SUEANNE MANIACI; [id number]; 12-21-1960   Admit date: 06/03/2017 Date of Consult: 06/04/2017  Primary Care Provider: Default, Provider, MD Primary Cardiologist: Dr. Tamala Julian Primary Electrophysiologist:  Dr. Lovena Le  Chief Complaint: diarrhea, shortness of breath  Patient Profile:   ZABELLA WEASE is a 56 y.o. female with a hx of CAD with large RV/inferior MI complicated by pseudoaneurysm s/p aneurysemectomy and CABG 10/2009 with papillary muscle rupture requiring bioprosthetic mitral valve replacement in [facility] MVR, ventricular tachycardia, ICM and VT arrest 10/2009 s/p AICD (revision 01/2012 due to RV lead problem), chronic systolic CHF, possible paroxysmal atrial fib/SVT, CKD stage III, thyroid disease, tobacco abuse, thymus enlargement s/p thymectomy, bladder cancer s/p resection 2009, active ovarian cancer (with progressive mets identified this admission) who is being seen today for the evaluation of SOB at the request of Dr. Hal Hope.  History of Present Illness:   She does not appear to have had a cath in our system since CABG, and no nucs since prior to bypass. Last echo was in 05/2015 showing EF 45-50%, with akinesis and aneurysmal deformity of the basal mid inferior myocardium, severe mitral stenosis by mean gradient but mild by valve area, c/w prior, moderately dilated LA. She had last seen Dr. Tamala Julian in 08/2016 at which time he noted Dr. Lovena Le had discovered 15% atrial tach/possible atrial fib noise on device interrogation - anticoagulation was not recommended at that time; it was advised that this continue to be followed and consider anticoagulation if this continues. Subsequent device checks show low AT/AF burden 5-6% with continued surveillance recommended, Dr. Lovena Le stated "looks like AVNRT to me." Her ovarian cancer has been followed by GYN Onc with surgery and chemotherapy.   She presented to [facility] initially with the CC of  diarrhea for several days. She had been trying to manage at home but it got significantly worse so she came tot he ED. CT abd/pelvis shows pancolitis, as well as progression of multifocal soft tissue nodularity compatible with peritoneal metastasis, + aortic atherosclerosis. She also reported SOB upon ROS to admitting provider, stating this time of year she typically develops dyspnea and wheezing. She says this actually hasn't been as bad this year. It improves with nebs. This occurs in the form of DOE. No orthopnea, LEE, weight gain, CP. Labs otherwise reveal leukocytosis of 15k, hyponatremia to 132, Cr 1.23, albumin 3.4, BNP 464, neg troponin x 1, Hgb down to 10.6, d-dimer 2.08. She has low grade temp elevation Tmax 99.4 and is mildly tachycardic and borderline hypotensive as well. VQ scan pending.  Past Medical History:  Diagnosis Date  . Arthritis 02/06/2012   "everywhere"  . Asthma    as a child  . Automatic implantable cardioverter-defibrillator in situ    DR. Beckie Salts   . Bladder cancer [facility]) 2009   "injected medicine to get rid of it"  . Chronic systolic CHF (congestive heart failure) ([place])   . CKD (chronic kidney disease), stage III ([place])   . Coronary artery disease    a. large RV/inferior MI complicated by pseudoaneurysm s/p aneurysemectomy and CABG 10/2009 with papillary muscle rupture requiring bioprosthetic mitral valve replacement in [facility].  . 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 H/O thymectomy   . High cholesterol   . History of mitral valve replacement with tissue graft 2011  . Hypothyroidism   . ICD (implantable cardiac defibrillator) in place   . Ischemic  cardiomyopathy    a.  ICM and VT arrest 10/2009 s/p AICD (revision 01/2012 due to RV lead problem).  . Myocardial infarction ([place]) 2011  . Neuropathy    FEET AND HANDS - FROM CHEMO - BUT MUCH IMPROVED  . PAF (paroxysmal atrial fibrillation) ([place])   . Pain    JOINT PAINS AND MUSCLE  ACHES ALL OVER.  Marland Kitchen Port-A-Cath in place    RIGHT UPPER CHEST  . SVT (supraventricular tachycardia) ([place])   . Tobacco abuse   . Ventricular tachycardia [facility])     Past Surgical History:  Procedure Laterality Date  . ABDOMINAL HYSTERECTOMY N/A 07/27/2013   Procedure: TUMOR EXCISION OF ABDOMINAL MASS;  Surgeon: Alvino Chapel, MD;  Location: [facility] ORS;  Service: Gynecology;  Laterality: N/A;  . APPENDECTOMY  1989  . BILATERAL OOPHORECTOMY  2007  . CARDIAC DEFIBRILLATOR PLACEMENT  02/06/2012   "lead change"  . CARDIAC VALVE REPLACEMENT    . CYSTOSCOPY Right 02/23/2013   Procedure: CYSTOSCOPY WITH STENT REMOVAL;  Surgeon: Alvino Chapel, MD;  Location: [facility];  Service: Gynecology;  Laterality: Right;  . IMPLANTABLE CARDIOVERTER DEFIBRILLATOR REVISION N/A 02/06/2012   Procedure: IMPLANTABLE CARDIOVERTER DEFIBRILLATOR REVISION;  Surgeon: Evans Lance, MD;  Location: [facility] CATH LAB;  Service: Cardiovascular;  Laterality: N/A;  . INSERT / REPLACE / REMOVE PACEMAKER  10/2009   DUAL-CHAMBER St. JUDE DEFIBRILLATOR  . LAPAROTOMY N/A 01/19/2013   Procedure: TUMOR DEBULKING / BOWEL RESECTION/INSERTION RIGHT UTERERAL DOUBLE J STENT/OMENTECTOMY;  Surgeon: Alvino Chapel, MD;  Location: [facility];  Service: Gynecology;  Laterality: N/A;  . MITRAL VALVE REPLACEMENT  2011   "pig valve"  . THYMUS REMOVED    . TONSILLECTOMY AND ADENOIDECTOMY  ~ 1967  . TOTAL HIP ARTHROPLASTY  05/06/2012   Procedure: TOTAL HIP ARTHROPLASTY;  Surgeon: Gearlean Alf, MD;  Location: [facility];  Service: Orthopedics;  Laterality: Right;  . TRANSURETHRAL RESECTION OF BLADDER  2009   "for bladder cancer"  . TUBAL LIGATION  1993  . VAGINAL HYSTERECTOMY  2001     Inpatient Medications: Scheduled Meds: . aspirin  325 mg Oral q morning - 10a  . carvedilol  12.5 mg Oral BID WC  . enoxaparin (LOVENOX) injection  40 mg Subcutaneous QHS  . ipratropium-albuterol  3 mL Nebulization BID  . zolpidem  5 mg Oral QHS    Continuous Infusions: . sodium chloride Stopped (06/04/17 0054)  . ciprofloxacin    . metronidazole Stopped (06/04/17 0148)   PRN Meds: acetaminophen **OR** acetaminophen, albuterol, fentaNYL (SUBLIMAZE) injection, LORazepam, ondansetron, ondansetron **OR** ondansetron (ZOFRAN) IV  Home Meds: Prior to Admission medications   Medication Sig Start Date End Date Taking? Authorizing Provider  albuterol (PROVENTIL HFA;VENTOLIN HFA) 108 (90 Base) MCG/ACT inhaler Inhale 2 puffs into the lungs every 4 (four) hours as needed for wheezing or shortness of breath. 09/02/16  Yes Belva Crome, MD  aspirin 325 MG EC tablet Take 325 mg by mouth every morning. Reported on 10/20/2015   Yes [provider]  b complex vitamins tablet Take 1 tablet by mouth daily.   Yes [provider]  carvedilol (COREG) 12.5 MG tablet TAKE 1 TABLET BY MOUTH 2 TIMES DAILY WITH A MEAL. 02/12/17  Yes Belva Crome, MD  cholecalciferol (VITAMIN D-400) 400 UNITS TABS tablet Take 400 Units by mouth 2 (two) times daily.   Yes [provider]  diphenhydrAMINE (BENADRYL) 25 MG tablet Take 25 mg by mouth as needed for itching or allergies.  ALLERGIES   Yes [provider]  furosemide (LASIX) 20 MG tablet Take 1 tablet (20 mg total) by mouth daily. May take an extra tablet (20 mg) as needed for leg swelling or shortness of breath. 08/27/16  Yes Evans Lance, MD  LORazepam (ATIVAN) 0.5 MG tablet TAKE 1 TABLET BY MOUTH EVERY 6 HOURS AS NEEDED FOR NAUSEA AND VOMITING 05/08/17  Yes Volanda Napoleon, MD  Multiple Vitamins-Minerals (MULTIVITAMIN PO) Take 1 tablet by mouth daily.    Yes [provider]  spironolactone (ALDACTONE) 25 MG tablet TAKE 1 TABLET (25 MG TOTAL) BY MOUTH 2 (TWO) TIMES DAILY. 11/11/16  Yes Belva Crome, MD  Vitamin D, Ergocalciferol, (DRISDOL) 50000 units CAPS capsule TAKE 1 CAPSULE (50,000 UNITS TOTAL) BY MOUTH EVERY 7 (SEVEN) DAYS. 08/12/16  Yes Ennever, Rudell Cobb, MD   zolpidem (AMBIEN) 5 MG tablet TAKE ONE TABLET BY MOUTH AT BEDTIME 05/12/17  Yes Ennever, Rudell Cobb, MD  carvedilol (COREG) 12.5 MG tablet Take 1 tablet (12.5 mg total) 2 (two) times daily with a meal by mouth. Patient not taking: Reported on 06/03/2017 05/06/17   Belva Crome, MD    Allergies:    Allergies  Allergen Reactions  . Prochlorperazine Edisylate Other (See Comments)    Nervous/ flutter/ shakes--compazine  . Adhesive [Tape] Other (See Comments)    Redness/skin peeling Redness/hives from adhesive tape( tolerates latex gloves); tolerates paper tape  . Prochlorperazine Other (See Comments)    Jitters, hyper    Social History:   Social History   Socioeconomic History  . Marital status: Divorced    Spouse name: Not on file  . Number of children: Not on file  . Years of education: Not on file  . Highest education level: Not on file  Social Needs  . Financial resource strain: Not on file  . Food insecurity - worry: Not on file  . Food insecurity - inability: Not on file  . Transportation needs - medical: Not on file  . Transportation needs - non-medical: Not on file  Occupational History    Comment: WORKS FULL TIME  Tobacco Use  . Smoking status: Current Some Day Smoker    Packs/day: 0.50    Years: 30.00    Pack years: 15.00    Types: Cigarettes    Start date: 03/28/1974  . Smokeless tobacco: Never Used  Substance and Sexual Activity  . Alcohol use: No    Alcohol/week: 0.0 oz  . Drug use: No  . Sexual activity: Not on file  Other Topics Concern  . Not on file  Social History Narrative   WORKS FULL TIME   SINGLE   TOBACCO USE-YES   IMPLANTATION OF DUAL-CHAMBER St. JUDE DEFIBRILLATOR    Family History:   The patient's family history includes Heart attack in her brother, maternal grandfather, and paternal grandfather; Stroke in her maternal grandmother.  ROS:  Please see the history of present illness.  All other ROS reviewed and negative.     Physical  Exam/Data:   Vitals:   06/03/17 2127 06/04/17 0009 06/04/17 0014 06/04/17 0514  BP:    98/70  Pulse:    99  Resp:    18  Temp:  99.4 F (37.4 C)  98 F (36.7 C)  TempSrc:  Oral  Oral  SpO2:   94% 93%  Weight: 201 lb (91.2 kg)     Height: 5\' 6"  (1.676 m)       Intake/Output Summary (Last 24 hours) at  06/04/2017 0809 Last data filed at 06/04/2017 0600 Gross per 24 hour  Intake 1256.25 ml  Output -  Net 1256.25 ml   Filed Weights   06/03/17 2127  Weight: 201 lb (91.2 kg)   Body mass index is 32.44 kg/m.  General: Well developed, well nourished overweight WF in no acute distress. Head: Normocephalic, atraumatic, sclera non-icteric, no xanthomas, nares are without discharge.  Neck: Negative for carotid bruits. JVD not elevated. Lungs: Diminished BS right base 1/3 up, no wheezes, rales or rhonchi. Breathing is unlabored. Heart: RRR with S1 S2. No murmurs, rubs, or gallops appreciated. Abdomen: Soft, non-tender, non-distended with normoactive bowel sounds. No hepatomegaly. No rebound/guarding. No obvious abdominal masses. Msk:  Strength and tone appear normal for age. Extremities: No clubbing or cyanosis. No edema.  Distal pedal pulses are 2+ and equal bilaterally. Neuro: Alert and oriented X 3. No facial asymmetry. No focal deficit. Moves all extremities spontaneously. Psych:  Responds to questions appropriately with a normal affect.  EKG:  The EKG was personally reviewed and demonstrates NSR 92bpm, NSIVCD, nonspecific ST-T changes similar to prior  Relevant CV Studies: Referenced above  Laboratory Data:  Chemistry Recent Labs  Lab 06/03/17 1241 06/03/17 2311 06/04/17 0047  NA 132*  --  133*  K 3.4*  --  3.8  CL 98*  --  101  CO2 24  --  21*  GLUCOSE 98  --  84  BUN 18  --  16  CREATININE 1.23* 1.26* 1.20*  CALCIUM 8.6*  --  8.2*  GFRNONAA 48* 46* 49*  GFRAA 55* 54* 57*  ANIONGAP 10  --  11    Recent Labs  Lab 06/03/17 1241 06/04/17 0047  PROT 6.8 6.4*   ALBUMIN 3.4* 3.2*  AST 23 24  ALT 19 19  ALKPHOS 61 61  BILITOT 0.7 0.9   Hematology Recent Labs  Lab 06/03/17 1241 06/03/17 2311 06/04/17 0047  WBC 15.1* 14.8* 14.0*  RBC 3.73* 3.45* 3.38*  HGB 11.9* 10.9* 10.6*  HCT 34.5* 32.0* 31.5*  MCV 92.5 92.8 93.2  MCH 31.9 31.6 31.4  MCHC 34.5 34.1 33.7  RDW 13.1 13.6 13.6  PLT 151 155 151   Cardiac Enzymes Recent Labs  Lab 06/03/17 1241  TROPONINI <0.03   No results for input(s): TROPIPOC in the last 168 hours.  BNP Recent Labs  Lab 06/03/17 2311  BNP 464.0*    DDimer  Recent Labs  Lab 06/04/17 0047  DDIMER 2.08*    Radiology/Studies:  Dg Chest 2 View  Result Date: 06/03/2017 CLINICAL DATA:  Four days of shortness of breath. Current smoker. History of coronary artery disease and previous MI, childhood asthma, CHF, mitral valve replacement. 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 EXAM: CHEST  2 VIEW COMPARISON:  Chest x-ray of April 29, 2016 and CT scan chest of December 06, 2016. FINDINGS: The lungs are well-expanded. There is no focal infiltrate. There is no pleural effusion. No pulmonary parenchymal masses are observed. The heart and pulmonary vascularity are normal. The patient has undergone previous mitral valve replacement. The ICD is in stable position. There is calcification in the wall of the aortic arch. There is no pleural effusion. The observed bony thorax exhibits no acute abnormality. IMPRESSION: Mild chronic bronchitic changes, stable. No pneumonia, CHF, nor other acute cardiopulmonary abnormality. Known soft tissue density pulmonary parenchymal masses seen on the CT scan of June 2018 are not clearly evident on today's study. Thoracic aortic atherosclerosis. Electronically Signed  By: David  Martinique M.D.   On: 06/03/2017 11:56   Ct Abdomen Pelvis W Contrast  Result Date: 06/03/2017 CLINICAL DATA:  Abdominal pain and diarrhea. EXAM: CT ABDOMEN AND PELVIS WITH CONTRAST TECHNIQUE: Multidetector  CT imaging of the abdomen and pelvis was performed using the standard protocol following bolus administration of intravenous contrast. CONTRAST:  100 cc of Omni 300 COMPARISON:  12/06/2016 FINDINGS: Lower chest: Mild interstitial prominence is identified within the lung bases bilaterally. No pleural effusion. Hepatobiliary: 1.2 cm low-attenuation structure and caudate lobe of liver is unchanged from 2016 and likely represents a small cyst. No suspicious liver abnormalities. The gallbladder appears tiny stones within the dependent portion of the gallbladder suspected. No gallbladder wall thickening. No biliary dilatation. Pancreas: Unremarkable. No pancreatic ductal dilatation or surrounding inflammatory changes. Spleen: Normal in size without focal abnormality. Adrenals/Urinary Tract: The adrenal glands appear within normal limits. Unremarkable appearance of both kidneys. No mass or hydronephrosis. Urinary bladder unremarkable. Stomach/Bowel: Small hiatal hernia. The small bowel loops have a normal course and caliber. There is abnormal wall thickening and inflammation involving the entire colon up to the level of the rectum compatible with pancolitis. No pneumatosis or bowel perforation. No abscess identified. Vascular/Lymphatic: Aortic atherosclerosis. No aneurysm. Small retroperitoneal lymph nodes identified but no adenopathy. Reproductive: Previous hysterectomy.  No adnexal mass. Other: Multiple peritoneal nodules are identified and appear progressive when compared with previous exam. Adjacent to the ascending colon is a soft tissue nodule measuring 3.1 cm wall, image 55 of series 2. Previously 0.8 cm. Mass in the right side of pelvis measures, image 72 of series 2. Previously 3.9 cm. Central pelvic soft tissue lesion measures 4.1 cm, image 75 of series 2. Previously 3.7 cm. Left posterior pelvis nodule measures 2.7 cm, image 74 of series 2. Previously 2.5 cm. Musculoskeletal: Previous right total hip  arthroplasty. No aggressive lytic or sclerotic bone lesions. IMPRESSION: 1. Examination is stool ball positive for pancolitis. No pneumatosis, perforation or abscess. 2. Progression of multifocal soft tissue nodularity compatible with peritoneal metastasis secondary to known ovarian cancer. 3.  Aortic Atherosclerosis (ICD10-I70.0). Electronically Signed   By: Kerby Moors M.D.   On: 06/03/2017 15:31   Assessment and Plan:   1. Pancolitis/diarrhea - per IM.  2. SOB/dyspnea on exertion - patient states she was not surprised to experience this as she typically gets this cyclically this time of year. She has not been clinically diagnosed with COPD although has smoked for over 30 years and has generally diminshed BS on the right. It's not clear there is a cardiac etiology. Troponin is negative, EKG nonacute. BNP mildly elevated but clinically she does not appear to be significantly volume overloaded. I would recommend to await echo and continue to treat respiratory hygiene as you are doing. With elevated d-dimer and malignancy, agree with VQ to evaluate for PE. Will follow along and discuss with MD.  3. CAD s/p CABG, aneurysmectomy, mitral valve replacement with tissue valve 2011 - await echo. Troponin negative. No recent CP.  4. Chronic systolic CHF - does not appear markedly volume overloaded. Weight is down 5lb from previous (likely exacerbated by diarrhea). Last EF 45-50%. F/u echo. Given her hypotension this admission, I will hold carvedilol and discuss with Dr. Meda Coffee -  consideration could be given to changing to Toprol given pt's report of periodic wheezing (although not currently present). Spironolactone appears to be on hold per IM. She is not on ACEi/ARB at home, unclear why. This can be readdressed with  primary cardiologist as OP.  5. PAF/SVT - low burden per EP notes, being followed for now - can f/u Dr. Tamala Julian to further review anticoagulation. Currently in NSR.  For questions or updates,  please contact [place] Please consult [email or web address] for contact info under Cardiology/STEMI. Signed, Charlie Pitter, PA-C  06/04/2017 8:09 AM   The patient was seen, examined and discussed with Melina Copa, PA-C and I agree with the above.   56 y.o. female with a hx of CAD with large RV/inferior MI complicated by pseudoaneurysm s/p aneurysemectomy and CABG 10/2009 with papillary muscle rupture requiring bioprosthetic mitral valve replacement in [facility] MVR, ventricular tachycardia, ICM and VT arrest 10/2009 s/p AICD (revision 01/2012 due to RV lead problem), chronic systolic CHF, possible paroxysmal atrial fib/SVT, CKD stage III, thyroid disease, tobacco abuse, thymus enlargement s/p thymectomy, bladder cancer s/p resection 2009, active ovarian cancer (with progressive mets identified this admission) who is being seen today for the evaluation of SOB at the request of Dr. Hal Hope. 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  She has been experiencing on and off orthopnea and PND, no LE edema, no chest pain, palpitations or syncope.   She is awaiting V/Q scan for pul;monary embolism.  On physical exam she has regular rate and rhythm, mild diastolic murmur, no S4, minimal crackles at the bases, no LE edema.  ECG shows SR, NSIVCD, non-specific ST T Wave abnormalities, unchaged from 2017.  Her echocardiogram today shows LVEF is lower at 35-40%, previously 45-50%, with global hypokinesis and inferior akinesis. There is a degenerate bioprosthetic mitral valve with severe stenosis and mild regurgitation. The leafelts are calcified and poorly mobile. Mean gradient of 21 mmHg (up from 16 mmHg) with calculated AVA of around 1.1 cm2 at HR of 95 bpm, moderate LAE and moderate pulmonary hypertension with RVSP of 65 mmHg is noted.  The patient is chest pain free and doesn't appear to be  fluid overloaded. She doesn't need additional workup in the acute settings.  However, her LVEF is deteriorating, her bioprosthetic mitral valve is now severely stenosed and pulmonary hypertension is worsening. Ultimately she will need a re-do MVR and possibly redo CABG - if she is considered for redo MVR we will arrange for a right and left cardiac catheterization.  However, further plan depends on her prognosis given metastatic cancer. We would highly appreciate a prognosis estimate by oncology so we know how aggressive we can be with her cardiac therapy.  She can be discharged from cardiac standpoint, we will arrange for a follow up with Dr Tamala Julian.   Ena Dawley, MD 06/04/2017

## 2017-06-04 NOTE — Consult Note (Signed)
[facility] Consult  Referring Provider: Rise Patience, MD Primary Care Physician:  Default, Provider, MD Primary Gastroenterologist: Althia Forts  Reason for Consultation:  Diarrhea, colitis on CAT scan  HPI: [name] is a 56 y.o. Caucasian female was in her usual state of health until Saturday, 5 days ago when she developed multiple episodes of loose, watery, nonbloody bowel movements. For the first 3 days, she would have a bowel movement every 30 minutes to every hour, worse at night. This was not associated with nausea, vomiting, fever, chills or rigors. Patient reports getting amoxicillin 3 days prior to onset of diarrhea for dental work as she has history of hip surgery. Patient denies sick contacts, starting any new medications, recent travel.  Normally patient has a bowel movement every 3 days, stools are soft, nonbloody and not black. No prior colonoscopy. Patient reports he also had worsening shortness of breath, which would improve after rest but would get worse on exertion for the last few days. Patient also complains of lower abdominal pain described more as cramps, but also present all over the abdomen in a generalized fashion.  Patient denies recent weight loss, but at present has loss of appetite and feels weak. Since admission she has been started on antibiotics and Imodium, and reports improvement in the frequency of bowel movements, however, stools are still loose and watery.    Past Medical History:  Diagnosis Date  . Arthritis 02/06/2012   "everywhere"  . Asthma    as a child  . Automatic implantable cardioverter-defibrillator in situ    DR. Beckie Salts   . Bladder cancer [facility]) 2009   "injected medicine to get rid of it"  . Chronic systolic CHF (congestive heart failure) ([place])   . CKD (chronic kidney disease), stage III ([place])   . Coronary artery disease    a. large RV/inferior MI complicated by pseudoaneurysm s/p aneurysemectomy and CABG  10/2009 with papillary muscle rupture requiring bioprosthetic mitral valve replacement in [facility].  . 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 H/O thymectomy   . High cholesterol   . History of mitral valve replacement with tissue graft 2011  . Hypothyroidism   . ICD (implantable cardiac defibrillator) in place   . Ischemic cardiomyopathy    a.  ICM and VT arrest 10/2009 s/p AICD (revision 01/2012 due to RV lead problem).  . Myocardial infarction ([place]) 2011  . Neuropathy    FEET AND HANDS - FROM CHEMO - BUT MUCH IMPROVED  . PAF (paroxysmal atrial fibrillation) ([place])   . Pain    JOINT PAINS AND MUSCLE ACHES ALL OVER.  Marland Kitchen Port-A-Cath in place    RIGHT UPPER CHEST  . SVT (supraventricular tachycardia) ([place])   . Tobacco abuse   . Ventricular tachycardia [facility])     Past Surgical History:  Procedure Laterality Date  . ABDOMINAL HYSTERECTOMY N/A 07/27/2013   Procedure: TUMOR EXCISION OF ABDOMINAL MASS;  Surgeon: Alvino Chapel, MD;  Location: [facility] ORS;  Service: Gynecology;  Laterality: N/A;  . APPENDECTOMY  1989  . BILATERAL OOPHORECTOMY  2007  . CARDIAC DEFIBRILLATOR PLACEMENT  02/06/2012   "lead change"  . CARDIAC VALVE REPLACEMENT    . CYSTOSCOPY Right 02/23/2013   Procedure: CYSTOSCOPY WITH STENT REMOVAL;  Surgeon: Alvino Chapel, MD;  Location: [facility];  Service: Gynecology;  Laterality: Right;  . IMPLANTABLE CARDIOVERTER DEFIBRILLATOR REVISION N/A 02/06/2012   Procedure: IMPLANTABLE CARDIOVERTER DEFIBRILLATOR REVISION;  Surgeon: Carleene Overlie  Peyton Najjar, MD;  Location: [facility] CATH LAB;  Service: Cardiovascular;  Laterality: N/A;  . INSERT / REPLACE / REMOVE PACEMAKER  10/2009   DUAL-CHAMBER St. JUDE DEFIBRILLATOR  . LAPAROTOMY N/A 01/19/2013   Procedure: TUMOR DEBULKING / BOWEL RESECTION/INSERTION RIGHT UTERERAL DOUBLE J STENT/OMENTECTOMY;  Surgeon: Alvino Chapel, MD;  Location: [facility];  Service: Gynecology;  Laterality: N/A;  . MITRAL VALVE  REPLACEMENT  2011   "pig valve"  . THYMUS REMOVED    . TONSILLECTOMY AND ADENOIDECTOMY  ~ 1967  . TOTAL HIP ARTHROPLASTY  05/06/2012   Procedure: TOTAL HIP ARTHROPLASTY;  Surgeon: Gearlean Alf, MD;  Location: [facility];  Service: Orthopedics;  Laterality: Right;  . TRANSURETHRAL RESECTION OF BLADDER  2009   "for bladder cancer"  . TUBAL LIGATION  1993  . VAGINAL HYSTERECTOMY  2001    Prior to Admission medications   Medication Sig Start Date End Date Taking? Authorizing Provider  albuterol (PROVENTIL HFA;VENTOLIN HFA) 108 (90 Base) MCG/ACT inhaler Inhale 2 puffs into the lungs every 4 (four) hours as needed for wheezing or shortness of breath. 09/02/16  Yes Belva Crome, MD  aspirin 325 MG EC tablet Take 325 mg by mouth every morning. Reported on 10/20/2015   Yes [provider]  b complex vitamins tablet Take 1 tablet by mouth daily.   Yes [provider]  carvedilol (COREG) 12.5 MG tablet TAKE 1 TABLET BY MOUTH 2 TIMES DAILY WITH A MEAL. 02/12/17  Yes Belva Crome, MD  cholecalciferol (VITAMIN D-400) 400 UNITS TABS tablet Take 400 Units by mouth 2 (two) times daily.   Yes [provider]  diphenhydrAMINE (BENADRYL) 25 MG tablet Take 25 mg by mouth as needed for itching or allergies. ALLERGIES   Yes [provider]  furosemide (LASIX) 20 MG tablet Take 1 tablet (20 mg total) by mouth daily. May take an extra tablet (20 mg) as needed for leg swelling or shortness of breath. 08/27/16  Yes Evans Lance, MD  LORazepam (ATIVAN) 0.5 MG tablet TAKE 1 TABLET BY MOUTH EVERY 6 HOURS AS NEEDED FOR NAUSEA AND VOMITING 05/08/17  Yes Volanda Napoleon, MD  Multiple Vitamins-Minerals (MULTIVITAMIN PO) Take 1 tablet by mouth daily.    Yes [provider]  spironolactone (ALDACTONE) 25 MG tablet TAKE 1 TABLET (25 MG TOTAL) BY MOUTH 2 (TWO) TIMES DAILY. 11/11/16  Yes Belva Crome, MD  Vitamin D, Ergocalciferol, (DRISDOL) 50000 units CAPS capsule TAKE 1 CAPSULE  (50,000 UNITS TOTAL) BY MOUTH EVERY 7 (SEVEN) DAYS. 08/12/16  Yes Ennever, Rudell Cobb, MD  zolpidem (AMBIEN) 5 MG tablet TAKE ONE TABLET BY MOUTH AT BEDTIME 05/12/17  Yes Ennever, Rudell Cobb, MD  carvedilol (COREG) 12.5 MG tablet Take 1 tablet (12.5 mg total) 2 (two) times daily with a meal by mouth. Patient not taking: Reported on 06/03/2017 05/06/17   Belva Crome, MD    Current Facility-Administered Medications  Medication Dose Route Frequency Provider Last Rate Last Dose  . 0.9 %  sodium chloride infusion   Intravenous Continuous Rise Patience, MD   Stopped at 06/04/17 [phone]  . acetaminophen (TYLENOL) tablet 650 mg  650 mg Oral Q6H PRN Rise Patience, MD       Or  . acetaminophen (TYLENOL) suppository 650 mg  650 mg Rectal Q6H PRN Rise Patience, MD      . albuterol (PROVENTIL) (2.5 MG/3ML) 0.083% nebulizer solution 2.5 mg  2.5 mg Nebulization Q4H PRN Gean Birchwood  N, MD      . aspirin EC tablet 325 mg  325 mg Oral q morning - 10a Rise Patience, MD   325 mg at 06/04/17 0931  . ciprofloxacin (CIPRO) IVPB 400 mg  400 mg Intravenous Q12H Green, Terri L, RPH 200 mL/hr at 06/04/17 1056 400 mg at 06/04/17 1056  . enoxaparin (LOVENOX) injection 40 mg  40 mg Subcutaneous QHS Rise Patience, MD      . fentaNYL (SUBLIMAZE) injection 50 mcg  50 mcg Intravenous Q2H PRN Ward, Delice Bison, DO   50 mcg at 06/03/17 2146  . LORazepam (ATIVAN) tablet 0.5 mg  0.5 mg Oral Q6H PRN Rise Patience, MD      . metroNIDAZOLE (FLAGYL) IVPB 500 mg  500 mg Intravenous Q8H Thurnell Lose, MD   Stopped at 06/04/17 1049  . ondansetron (ZOFRAN) injection 4 mg  4 mg Intravenous Q6H PRN Ward, Kristen N, DO      . ondansetron (ZOFRAN) tablet 4 mg  4 mg Oral Q6H PRN Rise Patience, MD       Or  . ondansetron [facility]) injection 4 mg  4 mg Intravenous Q6H PRN Rise Patience, MD      . zolpidem [facility]) tablet 5 mg  5 mg Oral QHS Rise Patience, MD   5 mg at 06/04/17 0009     Allergies as of 06/03/2017 - Review Complete 06/03/2017  Allergen Reaction Noted  . Prochlorperazine edisylate Other (See Comments)   . Adhesive [tape] Other (See Comments) 02/05/2012  . Prochlorperazine Other (See Comments) 08/31/2015    Family History  Problem Relation Age of Onset  . Heart attack Paternal Grandfather   . Heart attack Maternal Grandfather   . Stroke Maternal Grandmother   . Heart attack Brother     Social History   Socioeconomic History  . Marital status: Divorced    Spouse name: Not on file  . Number of children: Not on file  . Years of education: Not on file  . Highest education level: Not on file  Social Needs  . Financial resource strain: Not on file  . Food insecurity - worry: Not on file  . Food insecurity - inability: Not on file  . Transportation needs - medical: Not on file  . Transportation needs - non-medical: Not on file  Occupational History    Comment: WORKS FULL TIME  Tobacco Use  . Smoking status: Current Some Day Smoker    Packs/day: 0.50    Years: 30.00    Pack years: 15.00    Types: Cigarettes    Start date: 03/28/1974  . Smokeless tobacco: Never Used  Substance and Sexual Activity  . Alcohol use: No    Alcohol/week: 0.0 oz  . Drug use: No  . Sexual activity: Not on file  Other Topics Concern  . Not on file  Social History Narrative   WORKS FULL TIME   SINGLE   TOBACCO USE-YES   IMPLANTATION OF DUAL-CHAMBER St[employer]UDE DEFIBRILLATOR    Review of Systems: Positive for: GI: Described in detail in HPI.    Gen: anorexia, fatigue, weakness,Denies any fever, chills, rigors, night sweats,  malaise, involuntary weight loss, and sleep disorder CV: Denies chest pain, angina, palpitations, syncope, orthopnea, PND, peripheral edema, and claudication. Resp: dyspnea,Denies  cough, sputum, wheezing, coughing up blood. GU : Denies urinary burning, blood in urine, urinary frequency, urinary hesitancy, nocturnal urination, and urinary  incontinence. MS: Denies joint pain or swelling.  Denies muscle weakness, cramps, atrophy.  Derm: Denies rash, itching, oral ulcerations, hives, unhealing ulcers.  Psych: Denies depression, anxiety, memory loss, suicidal ideation, hallucinations,  and confusion. Heme: Denies bruising, bleeding, and enlarged lymph nodes. Neuro:  Denies any headaches, dizziness, paresthesias. Endo:  Denies any problems with DM, thyroid, adrenal function.  Physical Exam: Vital signs in last 24 hours: Temp:  [98 F (36.7 C)-99.4 F (37.4 C)] 98 F (36.7 C) (12/12 0514) Pulse Rate:  [88-103] 99 (12/12 0514) Resp:  [13-20] 18 (12/12 0514) BP: (91-114)/(59-75) 98/70 (12/12 0514) SpO2:  [91 %-100 %] 94 % (12/12 0845) Weight:  [91.2 kg (201 lb)] 91.2 kg (201 lb) (12/11 2127) Last BM Date: 06/03/17  General:   Alert,  Well-developed, well-nourished, pleasant and cooperative in NAD Head:  Normocephalic and atraumatic. Eyes:  Sclera clear, no icterus.   Conjunctiva pink. Ears:  Normal auditory acuity. Nose:  No deformity, discharge,  or lesions. Mouth:  No deformity or lesions.  Oropharynx pink & moist. Neck:  Supple; no masses or thyromegaly. Lungs:  Clear throughout to auscultation.   No wheezes, crackles, or rhonchi. No acute distress. Heart:  Regular rate and rhythm; no murmurs, clicks, rubs,  or gallops. Extremities:  Without clubbing or edema. Neurologic:  Alert and  oriented x4;  grossly normal neurologically. Skin:  Intact without significant lesions or rashes. Psych:  Alert and cooperative. Normal mood and affect. Abdomen:  Soft, mild generalized tenderness more prominent in suprapubic and bilateral lower quadrants and nondistended. No masses, hepatosplenomegaly or hernias noted. Hyperactive bowel sounds, without guarding, and without rebound.         Lab Results: Recent Labs    06/03/17 1241 06/03/17 2311 06/04/17 0047  WBC 15.1* 14.8* 14.0*  HGB 11.9* 10.9* 10.6*  HCT 34.5* 32.0* 31.5*   PLT 151 155 151   BMET Recent Labs    06/03/17 1241 06/03/17 2311 06/04/17 0047  NA 132*  --  133*  K 3.4*  --  3.8  CL 98*  --  101  CO2 24  --  21*  GLUCOSE 98  --  84  BUN 18  --  16  CREATININE 1.23* 1.26* 1.20*  CALCIUM 8.6*  --  8.2*   LFT Recent Labs    06/04/17 0047  PROT 6.4*  ALBUMIN 3.2*  AST 24  ALT 19  ALKPHOS 61  BILITOT 0.9  BILIDIR 0.2  IBILI 0.7   PT/INR No results for input(s): LABPROT, INR in the last 72 hours.  Studies/Results: Dg Chest 2 View  Result Date: 06/03/2017 CLINICAL DATA:  Four days of shortness of breath. Current smoker. History of coronary artery disease and previous MI, childhood asthma, CHF, mitral valve replacement. 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 EXAM: CHEST  2 VIEW COMPARISON:  Chest x-ray of April 29, 2016 and CT scan chest of December 06, 2016. FINDINGS: The lungs are well-expanded. There is no focal infiltrate. There is no pleural effusion. No pulmonary parenchymal masses are observed. The heart and pulmonary vascularity are normal. The patient has undergone previous mitral valve replacement. The ICD is in stable position. There is calcification in the wall of the aortic arch. There is no pleural effusion. The observed bony thorax exhibits no acute abnormality. IMPRESSION: Mild chronic bronchitic changes, stable. No pneumonia, CHF, nor other acute cardiopulmonary abnormality. Known soft tissue density pulmonary parenchymal masses seen on the CT scan of June 2018 are not clearly evident on today's study. Thoracic aortic atherosclerosis. Electronically  Signed   By: David  Martinique M.D.   On: 06/03/2017 11:56   Ct Abdomen Pelvis W Contrast  Result Date: 06/03/2017 CLINICAL DATA:  Abdominal pain and diarrhea. EXAM: CT ABDOMEN AND PELVIS WITH CONTRAST TECHNIQUE: Multidetector CT imaging of the abdomen and pelvis was performed using the standard protocol following bolus administration of intravenous contrast.  CONTRAST:  100 cc of Omni 300 COMPARISON:  12/06/2016 FINDINGS: Lower chest: Mild interstitial prominence is identified within the lung bases bilaterally. No pleural effusion. Hepatobiliary: 1.2 cm low-attenuation structure and caudate lobe of liver is unchanged from 2016 and likely represents a small cyst. No suspicious liver abnormalities. The gallbladder appears tiny stones within the dependent portion of the gallbladder suspected. No gallbladder wall thickening. No biliary dilatation. Pancreas: Unremarkable. No pancreatic ductal dilatation or surrounding inflammatory changes. Spleen: Normal in size without focal abnormality. Adrenals/Urinary Tract: The adrenal glands appear within normal limits. Unremarkable appearance of both kidneys. No mass or hydronephrosis. Urinary bladder unremarkable. Stomach/Bowel: Small hiatal hernia. The small bowel loops have a normal course and caliber. There is abnormal wall thickening and inflammation involving the entire colon up to the level of the rectum compatible with pancolitis. No pneumatosis or bowel perforation. No abscess identified. Vascular/Lymphatic: Aortic atherosclerosis. No aneurysm. Small retroperitoneal lymph nodes identified but no adenopathy. Reproductive: Previous hysterectomy.  No adnexal mass. Other: Multiple peritoneal nodules are identified and appear progressive when compared with previous exam. Adjacent to the ascending colon is a soft tissue nodule measuring 3.1 cm wall, image 55 of series 2. Previously 0.8 cm. Mass in the right side of pelvis measures, image 72 of series 2. Previously 3.9 cm. Central pelvic soft tissue lesion measures 4.1 cm, image 75 of series 2. Previously 3.7 cm. Left posterior pelvis nodule measures 2.7 cm, image 74 of series 2. Previously 2.5 cm. Musculoskeletal: Previous right total hip arthroplasty. No aggressive lytic or sclerotic bone lesions. IMPRESSION: 1. Examination is stool ball positive for pancolitis. No pneumatosis,  perforation or abscess. 2. Progression of multifocal soft tissue nodularity compatible with peritoneal metastasis secondary to known ovarian cancer. 3.  Aortic Atherosclerosis (ICD10-I70.0). Electronically Signed   By: Kerby Moors M.D.   On: 06/03/2017 15:31    Impression: 1. Pancolitis noted on CAT scan, leukocytosis(WBC 15.1 on admission) 2. Diarrhea, hyponatremia, hypokalemia, mild acidosis, mild renal impairment(BUN 16/creatinine 1.2/GFR 49) 3. History of ovarian cancer, progressive peritoneal metastases, prior chemotherapy(last in 2014) 4. History of bladder cancer,s/p surgery(tumor debulking/bowel resection/omentectomy) 5. Ischemic cardiomyopathy, AICD 6. Normocytic anemia, hemoglobin 10.6, MCV 93.2  Plan: C. difficile negative GI pathogen panel negative On full liquid diet, normal saline at 75 mL per hour, IV Cipro 400 milligrams every 12 hours and Flagyl 500 mg every 8 hours. Received Imodium 4 mg today morning.  Recommend changing aspirin from 325 mg to 81 mg, as if there is no improvement in diarrhea patient may need a diagnostic colonoscopy with biopsies. If diarrhea improves with antibiotics, plan colonoscopy as an outpatient.     LOS: 1 day   Ronnette Juniper, M.D.  06/04/2017, 11:22 AM  Pager [phone] If no answer or after 5 PM call [phone]

## 2017-06-04 NOTE — Progress Notes (Signed)
PHARMACY NOTE -  ANTIBIOTIC RENAL DOSE ADJUSTMENT   Request received for Pharmacy to assist with antibiotic renal dose adjustment.  Patient has been initiated on Cipro for Intra-abd infection. SCr 1.2, estimated CrCl 59 ml/min Current dosage is appropriate and need for further dosage adjustment appears unlikely at present. Will sign off at this time.  Please reconsult if a change in clinical status warrants re-evaluation of dosage.  Minda Ditto PharmD Pager [phone] 06/04/2017, 6:31 AM

## 2017-06-05 ENCOUNTER — Inpatient Hospital Stay (HOSPITAL_COMMUNITY): Payer: BLUE CROSS/BLUE SHIELD

## 2017-06-05 ENCOUNTER — Encounter (HOSPITAL_COMMUNITY): Payer: Self-pay | Admitting: Radiology

## 2017-06-05 DIAGNOSIS — I255 Ischemic cardiomyopathy: Secondary | ICD-10-CM

## 2017-06-05 DIAGNOSIS — I25718 Atherosclerosis of autologous vein coronary artery bypass graft(s) with other forms of angina pectoris: Secondary | ICD-10-CM

## 2017-06-05 DIAGNOSIS — I050 Rheumatic mitral stenosis: Secondary | ICD-10-CM

## 2017-06-05 DIAGNOSIS — C569 Malignant neoplasm of unspecified ovary: Secondary | ICD-10-CM

## 2017-06-05 DIAGNOSIS — J410 Simple chronic bronchitis: Secondary | ICD-10-CM

## 2017-06-05 DIAGNOSIS — K529 Noninfective gastroenteritis and colitis, unspecified: Secondary | ICD-10-CM

## 2017-06-05 DIAGNOSIS — R197 Diarrhea, unspecified: Secondary | ICD-10-CM

## 2017-06-05 LAB — CBC WITH DIFFERENTIAL/PLATELET
BASOS PCT: 1 %
Basophils Absolute: 0.1 10*3/uL (ref 0.0–0.1)
Eosinophils Absolute: 0.6 10*3/uL (ref 0.0–0.7)
Eosinophils Relative: 4 %
HEMATOCRIT: 32.9 % — AB (ref 36.0–46.0)
HEMOGLOBIN: 11.3 g/dL — AB (ref 12.0–15.0)
Lymphocytes Relative: 12 %
Lymphs Abs: 1.6 10*3/uL (ref 0.7–4.0)
MCH: 31.6 pg (ref 26.0–34.0)
MCHC: 34.3 g/dL (ref 30.0–36.0)
MCV: 91.9 fL (ref 78.0–100.0)
MONOS PCT: 11 %
Monocytes Absolute: 1.4 10*3/uL — ABNORMAL HIGH (ref 0.1–1.0)
NEUTROS ABS: 9.6 10*3/uL — AB (ref 1.7–7.7)
NEUTROS PCT: 72 %
Platelets: 171 10*3/uL (ref 150–400)
RBC: 3.58 MIL/uL — AB (ref 3.87–5.11)
RDW: 13.7 % (ref 11.5–15.5)
WBC: 13.2 10*3/uL — AB (ref 4.0–10.5)

## 2017-06-05 LAB — BASIC METABOLIC PANEL
ANION GAP: 10 (ref 5–15)
BUN: 11 mg/dL (ref 6–20)
CALCIUM: 8.3 mg/dL — AB (ref 8.9–10.3)
CHLORIDE: 100 mmol/L — AB (ref 101–111)
CO2: 22 mmol/L (ref 22–32)
Creatinine, Ser: 1.05 mg/dL — ABNORMAL HIGH (ref 0.44–1.00)
GFR calc non Af Amer: 58 mL/min — ABNORMAL LOW (ref >60)
Glucose, Bld: 97 mg/dL (ref 65–99)
Potassium: 3.5 mmol/L (ref 3.5–5.1)
SODIUM: 132 mmol/L — AB (ref 135–145)

## 2017-06-05 LAB — MAGNESIUM: MAGNESIUM: 1.7 mg/dL (ref 1.7–2.4)

## 2017-06-05 MED ORDER — LOSARTAN POTASSIUM 25 MG PO TABS
12.5000 mg | ORAL_TABLET | Freq: Every day | ORAL | Status: DC
  Administered 2017-06-05: 12.5 mg via ORAL
  Filled 2017-06-05 (×2): qty 1

## 2017-06-05 MED ORDER — MAGNESIUM SULFATE 4 GM/100ML IV SOLN
4.0000 g | Freq: Once | INTRAVENOUS | Status: AC
Start: 2017-06-05 — End: 2017-06-05
  Administered 2017-06-05: 4 g via INTRAVENOUS
  Filled 2017-06-05: qty 100

## 2017-06-05 MED ORDER — POTASSIUM CHLORIDE CRYS ER 20 MEQ PO TBCR
40.0000 meq | EXTENDED_RELEASE_TABLET | Freq: Once | ORAL | Status: AC
  Administered 2017-06-05: 40 meq via ORAL
  Filled 2017-06-05: qty 2

## 2017-06-05 MED ORDER — CARVEDILOL 3.125 MG PO TABS
3.1250 mg | ORAL_TABLET | Freq: Two times a day (BID) | ORAL | Status: DC
  Administered 2017-06-05 – 2017-06-06 (×2): 3.125 mg via ORAL
  Filled 2017-06-05 (×2): qty 1

## 2017-06-05 MED ORDER — ALBUTEROL SULFATE (2.5 MG/3ML) 0.083% IN NEBU
2.5000 mg | INHALATION_SOLUTION | Freq: Two times a day (BID) | RESPIRATORY_TRACT | Status: DC
  Administered 2017-06-05 – 2017-06-06 (×2): 2.5 mg via RESPIRATORY_TRACT
  Filled 2017-06-05 (×2): qty 3

## 2017-06-05 MED ORDER — IOPAMIDOL (ISOVUE-300) INJECTION 61%
INTRAVENOUS | Status: AC
  Administered 2017-06-05: 75 mL
  Filled 2017-06-05: qty 100

## 2017-06-05 NOTE — Progress Notes (Signed)
Subjective: She was seen and examined at bedside. She reports decrease in frequency of bowel movement, along with increased consistency of stool from watery to more semi-liquid. She also reports that lower abdominal pain and tenderness have almost resolved. She wants her diet to be progressed to something more solid.  Objective: Vital signs in last 24 hours: Temp:  [97.7 F (36.5 C)-99.6 F (37.6 C)] 98.3 F (36.8 C) (12/13 0437) Pulse Rate:  [94-103] 94 (12/13 1000) Resp:  [17-18] 18 (12/13 0437) BP: (94-113)/(59-69) 111/69 (12/13 1000) SpO2:  [93 %-96 %] 93 % (12/13 0832) Weight:  [93.3 kg (205 lb 11 oz)] 93.3 kg (205 lb 11 oz) (12/13 0501) Weight change: 2.127 kg (4 lb 11 oz) Last BM Date: 06/04/17  PE: Not in acute distress GENERAL: No pallor, no icterus, oral mucosa moist ABDOMEN: Soft, nondistended, nontender, normoactive bowel sounds EXTREMITIES: No deformity  Lab Results: Results for orders placed or performed during the hospital encounter of 06/03/17 (from the past 48 hour(s))  CBC with Differential     Status: Abnormal   Collection Time: 06/03/17 12:41 PM  Result Value Ref Range   WBC 15.1 (H) 4.0 - 10.5 K/uL   RBC 3.73 (L) 3.87 - 5.11 MIL/uL   Hemoglobin 11.9 (L) 12.0 - 15.0 g/dL   HCT 34.5 (L) 36.0 - 46.0 %   MCV 92.5 78.0 - 100.0 fL   MCH 31.9 26.0 - 34.0 pg   MCHC 34.5 30.0 - 36.0 g/dL   RDW 13.1 11.5 - 15.5 %   Platelets 151 150 - 400 K/uL   Neutrophils Relative % 80 %   Neutro Abs 12.0 (H) 1.7 - 7.7 K/uL   Lymphocytes Relative 8 %   Lymphs Abs 1.3 0.7 - 4.0 K/uL   Monocytes Relative 8 %   Monocytes Absolute 1.2 (H) 0.1 - 1.0 K/uL   Eosinophils Relative 4 %   Eosinophils Absolute 0.6 0.0 - 0.7 K/uL   Basophils Relative 0 %   Basophils Absolute 0.0 0.0 - 0.1 K/uL  Comprehensive metabolic panel     Status: Abnormal   Collection Time: 06/03/17 12:41 PM  Result Value Ref Range   Sodium 132 (L) 135 - 145 mmol/L   Potassium 3.4 (L) 3.5 - 5.1 mmol/L    Chloride 98 (L) 101 - 111 mmol/L   CO2 24 22 - 32 mmol/L   Glucose, Bld 98 65 - 99 mg/dL   BUN 18 6 - 20 mg/dL   Creatinine, Ser 1.23 (H) 0.44 - 1.00 mg/dL   Calcium 8.6 (L) 8.9 - 10.3 mg/dL   Total Protein 6.8 6.5 - 8.1 g/dL   Albumin 3.4 (L) 3.5 - 5.0 g/dL   AST 23 15 - 41 U/L   ALT 19 14 - 54 U/L   Alkaline Phosphatase 61 38 - 126 U/L   Total Bilirubin 0.7 0.3 - 1.2 mg/dL   GFR calc non Af Amer 48 (L) >60 mL/min   GFR calc Af Amer 55 (L) >60 mL/min    Comment: (NOTE) The eGFR has been calculated using the CKD EPI equation. This calculation has not been validated in all clinical situations. eGFR's persistently <60 mL/min signify possible Chronic Kidney Disease.    Anion gap 10 5 - 15  Lipase, blood     Status: None   Collection Time: 06/03/17 12:41 PM  Result Value Ref Range   Lipase 29 11 - 51 U/L  Troponin I     Status: None   Collection Time:  06/03/17 12:41 PM  Result Value Ref Range   Troponin I <0.03 <0.03 ng/mL  C difficile quick scan w PCR reflex     Status: None   Collection Time: 06/03/17  2:29 PM  Result Value Ref Range   C Diff antigen NEGATIVE NEGATIVE   C Diff toxin NEGATIVE NEGATIVE   C Diff interpretation No C. difficile detected.   Gastrointestinal Panel by PCR , Stool     Status: None   Collection Time: 06/03/17  2:29 PM  Result Value Ref Range   Campylobacter species NOT DETECTED NOT DETECTED   Plesimonas shigelloides NOT DETECTED NOT DETECTED   Salmonella species NOT DETECTED NOT DETECTED   Yersinia enterocolitica NOT DETECTED NOT DETECTED   Vibrio species NOT DETECTED NOT DETECTED   Vibrio cholerae NOT DETECTED NOT DETECTED   Enteroaggregative E coli (EAEC) NOT DETECTED NOT DETECTED   Enteropathogenic E coli (EPEC) NOT DETECTED NOT DETECTED   Enterotoxigenic E coli (ETEC) NOT DETECTED NOT DETECTED   Shiga like toxin producing E coli (STEC) NOT DETECTED NOT DETECTED   Shigella/Enteroinvasive E coli (EIEC) NOT DETECTED NOT DETECTED    Cryptosporidium NOT DETECTED NOT DETECTED   Cyclospora cayetanensis NOT DETECTED NOT DETECTED   Entamoeba histolytica NOT DETECTED NOT DETECTED   Giardia lamblia NOT DETECTED NOT DETECTED   Adenovirus F40/41 NOT DETECTED NOT DETECTED   Astrovirus NOT DETECTED NOT DETECTED   Norovirus GI/GII NOT DETECTED NOT DETECTED   Rotavirus A NOT DETECTED NOT DETECTED   Sapovirus (I, II, IV, and V) NOT DETECTED NOT DETECTED  HIV antibody (Routine Testing)     Status: None   Collection Time: 06/03/17 11:11 PM  Result Value Ref Range   HIV Screen 4th Generation wRfx Non Reactive Non Reactive    Comment: (NOTE) Performed At: [facility] [place], [place] [id number] Rush Farmer MD [id number]   CBC     Status: Abnormal   Collection Time: 06/03/17 11:11 PM  Result Value Ref Range   WBC 14.8 (H) 4.0 - 10.5 K/uL   RBC 3.45 (L) 3.87 - 5.11 MIL/uL   Hemoglobin 10.9 (L) 12.0 - 15.0 g/dL   HCT 32.0 (L) 36.0 - 46.0 %   MCV 92.8 78.0 - 100.0 fL   MCH 31.6 26.0 - 34.0 pg   MCHC 34.1 30.0 - 36.0 g/dL   RDW 13.6 11.5 - 15.5 %   Platelets 155 150 - 400 K/uL  Creatinine, serum     Status: Abnormal   Collection Time: 06/03/17 11:11 PM  Result Value Ref Range   Creatinine, Ser 1.26 (H) 0.44 - 1.00 mg/dL   GFR calc non Af Amer 46 (L) >60 mL/min   GFR calc Af Amer 54 (L) >60 mL/min    Comment: (NOTE) The eGFR has been calculated using the CKD EPI equation. This calculation has not been validated in all clinical situations. eGFR's persistently <60 mL/min signify possible Chronic Kidney Disease.   Lactic acid, plasma     Status: None   Collection Time: 06/03/17 11:11 PM  Result Value Ref Range   Lactic Acid, Venous 0.6 0.5 - 1.9 mmol/L  Brain natriuretic peptide     Status: Abnormal   Collection Time: 06/03/17 11:11 PM  Result Value Ref Range   B Natriuretic Peptide 464.0 (H) 0.0 - 100.0 pg/mL  Basic metabolic panel     Status: Abnormal   Collection Time: 06/04/17 12:47 AM   Result Value Ref Range   Sodium 133 (L) 135 -  145 mmol/L   Potassium 3.8 3.5 - 5.1 mmol/L   Chloride 101 101 - 111 mmol/L   CO2 21 (L) 22 - 32 mmol/L   Glucose, Bld 84 65 - 99 mg/dL   BUN 16 6 - 20 mg/dL   Creatinine, Ser 1.20 (H) 0.44 - 1.00 mg/dL   Calcium 8.2 (L) 8.9 - 10.3 mg/dL   GFR calc non Af Amer 49 (L) >60 mL/min   GFR calc Af Amer 57 (L) >60 mL/min    Comment: (NOTE) The eGFR has been calculated using the CKD EPI equation. This calculation has not been validated in all clinical situations. eGFR's persistently <60 mL/min signify possible Chronic Kidney Disease.    Anion gap 11 5 - 15  CBC     Status: Abnormal   Collection Time: 06/04/17 12:47 AM  Result Value Ref Range   WBC 14.0 (H) 4.0 - 10.5 K/uL   RBC 3.38 (L) 3.87 - 5.11 MIL/uL   Hemoglobin 10.6 (L) 12.0 - 15.0 g/dL   HCT 31.5 (L) 36.0 - 46.0 %   MCV 93.2 78.0 - 100.0 fL   MCH 31.4 26.0 - 34.0 pg   MCHC 33.7 30.0 - 36.0 g/dL   RDW 13.6 11.5 - 15.5 %   Platelets 151 150 - 400 K/uL  Hepatic function panel     Status: Abnormal   Collection Time: 06/04/17 12:47 AM  Result Value Ref Range   Total Protein 6.4 (L) 6.5 - 8.1 g/dL   Albumin 3.2 (L) 3.5 - 5.0 g/dL   AST 24 15 - 41 U/L   ALT 19 14 - 54 U/L   Alkaline Phosphatase 61 38 - 126 U/L   Total Bilirubin 0.9 0.3 - 1.2 mg/dL   Bilirubin, Direct 0.2 0.1 - 0.5 mg/dL   Indirect Bilirubin 0.7 0.3 - 0.9 mg/dL  D-dimer, quantitative (not at [facility])     Status: Abnormal   Collection Time: 06/04/17 12:47 AM  Result Value Ref Range   D-Dimer, Quant 2.08 (H) 0.00 - 0.50 ug/mL-FEU    Comment: (NOTE) At the manufacturer cut-off of 0.50 ug/mL FEU, this assay has been documented to exclude PE with a sensitivity and negative predictive value of 97 to 99%.  At this time, this assay has not been approved by the FDA to exclude DVT/VTE. Results should be correlated with clinical presentation.   Basic metabolic panel     Status: Abnormal   Collection Time: 06/05/17  5:16  AM  Result Value Ref Range   Sodium 132 (L) 135 - 145 mmol/L   Potassium 3.5 3.5 - 5.1 mmol/L   Chloride 100 (L) 101 - 111 mmol/L   CO2 22 22 - 32 mmol/L   Glucose, Bld 97 65 - 99 mg/dL   BUN 11 6 - 20 mg/dL   Creatinine, Ser 1.05 (H) 0.44 - 1.00 mg/dL   Calcium 8.3 (L) 8.9 - 10.3 mg/dL   GFR calc non Af Amer 58 (L) >60 mL/min   GFR calc Af Amer >60 >60 mL/min    Comment: (NOTE) The eGFR has been calculated using the CKD EPI equation. This calculation has not been validated in all clinical situations. eGFR's persistently <60 mL/min signify possible Chronic Kidney Disease.    Anion gap 10 5 - 15  CBC with Differential/Platelet     Status: Abnormal   Collection Time: 06/05/17  5:16 AM  Result Value Ref Range   WBC 13.2 (H) 4.0 - 10.5 K/uL   RBC 3.58 (L)  3.87 - 5.11 MIL/uL   Hemoglobin 11.3 (L) 12.0 - 15.0 g/dL   HCT 32.9 (L) 36.0 - 46.0 %   MCV 91.9 78.0 - 100.0 fL   MCH 31.6 26.0 - 34.0 pg   MCHC 34.3 30.0 - 36.0 g/dL   RDW 13.7 11.5 - 15.5 %   Platelets 171 150 - 400 K/uL   Neutrophils Relative % 72 %   Neutro Abs 9.6 (H) 1.7 - 7.7 K/uL   Lymphocytes Relative 12 %   Lymphs Abs 1.6 0.7 - 4.0 K/uL   Monocytes Relative 11 %   Monocytes Absolute 1.4 (H) 0.1 - 1.0 K/uL   Eosinophils Relative 4 %   Eosinophils Absolute 0.6 0.0 - 0.7 K/uL   Basophils Relative 1 %   Basophils Absolute 0.1 0.0 - 0.1 K/uL  Magnesium     Status: None   Collection Time: 06/05/17  5:16 AM  Result Value Ref Range   Magnesium 1.7 1.7 - 2.4 mg/dL    Studies/Results: Ct Chest W Contrast  Result Date: 06/05/2017 CLINICAL DATA:  Inpatient. Metastatic granulosa cell ovarian carcinoma. Dyspnea. Chest staging. EXAM: CT CHEST WITH CONTRAST TECHNIQUE: Multidetector CT imaging of the chest was performed during intravenous contrast administration. CONTRAST:  78m ISOVUE-300 IOPAMIDOL (ISOVUE-300) INJECTION 61% COMPARISON:  12/06/2016 CT chest.  06/03/2017 chest radiograph. FINDINGS: Cardiovascular:  Top-normal heart size. No significant pericardial fluid/thickening. Three lead left subclavian ICD is noted with lead tips in the right atrium and right ventricle. Mitral valve prosthesis is in place. Coronary atherosclerosis. Atherosclerotic nonaneurysmal thoracic aorta. Stable top-normal caliber main pulmonary artery (3.0 cm diameter) . No central pulmonary emboli. Mediastinum/Nodes: No discrete thyroid nodules. Unremarkable esophagus. No axillary adenopathy. Right paratracheal adenopathy up to 1.5 cm (series 2/ image 52), previously 1.2 cm, mildly increased. Newly enlarged 1.7 cm subcarinal node (series 2/ image 67). New mild bilateral hilar adenopathy measuring up to 1.1 cm on the right (series 2/image 64) and 1.1 cm on the left (series 2/image 71). Lungs/Pleura: No pneumothorax. New small dependent bilateral pleural effusions, left greater than right. Mild centrilobular emphysema with mild diffuse bronchial wall thickening. Moderate interlobular septal thickening throughout both lungs has increased since 12/06/2016 chest CT, relatively symmetrically involving the lungs. Hypoventilatory changes are noted in the dependent lungs. Subsolid 1.0 cm posterior right upper lobe (series 7/ image 41) and 0.5 cm left upper lobe (series 7/ image 42) pulmonary nodules are stable using similar measurement technique. No new significant pulmonary nodules. No lung masses. No acute consolidative airspace disease. Upper abdomen: Small hiatal hernia. Simple 1.3 cm caudate lobe liver cyst. Musculoskeletal: No aggressive appearing focal osseous lesions. Loose intra-articular osseous bodies are again noted throughout the left glenohumeral joint. Intact sternotomy wires. Moderate thoracic spondylosis. IMPRESSION: 1. Nonspecific interlobular septal thickening throughout both lungs, increased since 12/06/2016 chest CT. In a patient with a history of ovarian cancer, this finding raises concern for lymphangitic tumor spread, however the  patient also has a history of cardiac valvular disease and the findings may simply represent mild pulmonary edema. 2. Small dependent bilateral pleural effusions, new, left greater than right. 3. Nonspecific increased right paratracheal adenopathy, new subcarinal adenopathy and new bilateral hilar adenopathy. Findings are worrisome for progressive nodal metastatic disease. 4. Stable pulmonary nodules.  No new pulmonary nodules. 5. Chronic findings include: Coronary atherosclerosis. Small hiatal hernia. Aortic Atherosclerosis (ICD10-I70.0) and Emphysema (ICD10-J43.9). Electronically Signed   By: JIlona SorrelM.D.   On: 06/05/2017 12:02   Ct Abdomen Pelvis W Contrast  Result Date: 06/03/2017 CLINICAL DATA:  Abdominal pain and diarrhea. EXAM: CT ABDOMEN AND PELVIS WITH CONTRAST TECHNIQUE: Multidetector CT imaging of the abdomen and pelvis was performed using the standard protocol following bolus administration of intravenous contrast. CONTRAST:  100 cc of Omni 300 COMPARISON:  12/06/2016 FINDINGS: Lower chest: Mild interstitial prominence is identified within the lung bases bilaterally. No pleural effusion. Hepatobiliary: 1.2 cm low-attenuation structure and caudate lobe of liver is unchanged from 2016 and likely represents a small cyst. No suspicious liver abnormalities. The gallbladder appears tiny stones within the dependent portion of the gallbladder suspected. No gallbladder wall thickening. No biliary dilatation. Pancreas: Unremarkable. No pancreatic ductal dilatation or surrounding inflammatory changes. Spleen: Normal in size without focal abnormality. Adrenals/Urinary Tract: The adrenal glands appear within normal limits. Unremarkable appearance of both kidneys. No mass or hydronephrosis. Urinary bladder unremarkable. Stomach/Bowel: Small hiatal hernia. The small bowel loops have a normal course and caliber. There is abnormal wall thickening and inflammation involving the entire colon up to the level of  the rectum compatible with pancolitis. No pneumatosis or bowel perforation. No abscess identified. Vascular/Lymphatic: Aortic atherosclerosis. No aneurysm. Small retroperitoneal lymph nodes identified but no adenopathy. Reproductive: Previous hysterectomy.  No adnexal mass. Other: Multiple peritoneal nodules are identified and appear progressive when compared with previous exam. Adjacent to the ascending colon is a soft tissue nodule measuring 3.1 cm wall, image 55 of series 2. Previously 0.8 cm. Mass in the right side of pelvis measures, image 72 of series 2. Previously 3.9 cm. Central pelvic soft tissue lesion measures 4.1 cm, image 75 of series 2. Previously 3.7 cm. Left posterior pelvis nodule measures 2.7 cm, image 74 of series 2. Previously 2.5 cm. Musculoskeletal: Previous right total hip arthroplasty. No aggressive lytic or sclerotic bone lesions. IMPRESSION: 1. Examination is stool ball positive for pancolitis. No pneumatosis, perforation or abscess. 2. Progression of multifocal soft tissue nodularity compatible with peritoneal metastasis secondary to known ovarian cancer. 3.  Aortic Atherosclerosis (ICD10-I70.0). Electronically Signed   By: Kerby Moors M.D.   On: 06/03/2017 15:31   Nm Pulmonary Perf And Vent  Result Date: 06/04/2017 CLINICAL DATA:  Shortness of Breath EXAM: NUCLEAR MEDICINE VENTILATION - PERFUSION LUNG SCAN VIEWS: Anterior, posterior, left lateral, right lateral, RPO, LPO, RAO, LAO -ventilation and perfusion RADIOPHARMACEUTICALS:  32.4 mCi Technetium-87mDTPA aerosol inhalation and 4.3 mCi Technetium-975mAA IV COMPARISON:  Chest radiograph June 03, 2017 FINDINGS: Ventilation: There are multiple subsegmental ventilation defects, slightly more on the right than on the left. No segmental ventilation defects are evident. Perfusion: There are scattered subsegmental defects, largest involving a portion of the anterior segment of the left upper lobe. This defect matches the  ventilation study. There are fewer perfusion defects than ventilation defects. There is no appreciable perfusion defect disproportionate to ventilation defects. IMPRESSION: Scattered subsegmental defects, more notable on the ventilation and perfusion study. Largest defect on the perfusion study is in the left upper lobe which matches the ventilation defect. There is no significant ventilation/perfusion mismatch. This study constitutes an overall low probability of pulmonary embolus based on PIOPED II guidelines. Electronically Signed   By: WiLowella GripII M.D.   On: 06/04/2017 14:23    Medications: I have reviewed the patient's current medications.  Assessment: Pancolitis and diarrhea-negative C. difficile/GI pathogen Hyponatremia, hypokalemia, low magnesium, mild renal impairment Leukocytosis-improving History of ovarian cancer, CT chest worrisome for progressive nodal metastatic disease  Plan: Increase diet to low fiber, low residue. Continue IV Cipro and IV  Flagyl. If diarrhea continues to improve, recommend continuing antibiotics for a total of 10 days, although exact infectious etiology has not been identified. Monitor electrolytes and replace as needed. Recommend outpatient colonoscopy in a few weeks.  On the other hand, if diarrhea worsens or is persistent, will consider flexible sigmoidoscopy(unprepped) with biopsies. It would be ideal to perform colonoscopy electively as an outpatient after resolution of diarrhea, as patient has never had a colonoscopy(for screening).   Ronnette Juniper 06/05/2017, 12:35 PM   Pager [phone] If no answer or after 5 PM call [phone]

## 2017-06-05 NOTE — Progress Notes (Signed)
To CT

## 2017-06-05 NOTE — Progress Notes (Signed)
PROGRESS NOTE    [name]  CWC:[id number] DOB: [birth year] DOA: 06/03/2017 PCP: Default, Provider, MD    Brief Narrative:  [name] is a 56 y.o. female with known history of nonischemic cardiomyopathy status post AICD placement, tissue mitral valve replacement, paroxysmal atrial fibrillation, chronic kidney disease chronic anemia and history of ovarian cancer presents to the ER at [facility] with complaints of persistent diarrhea multiple episodes for last 4 days.  Last 2 days patient also has been having increasing shortness of breath.  Shortness of breath present even at rest no associated chest pain productive cough fever or chills.  Patient did get 1 dose of amoxicillin prior to her dental appointment last week.  Patient diarrhea is associated with some abdominal discomfort denies any vomiting.  Denies any blood in the diarrhea.  ED Course: In the ER patient had a CT scan of the abdomen which shows pancolitis and also features concerning for peritoneal metastasis.  Stool for C. difficile has been negative.  GI pathogen panel is pending.  Patient was given fluids in the ER and also potassium replacement due to hypokalemia.  Patient shortness of breath patient feels that the symptoms happen when she gets bronchitis and was given nebulizer following which it improved but at the time of my exam patient also has mild shortness of breath.  Patient is being admitted for persistent diarrhea and shortness of breath.  EKG shows normal sinus rhythm troponin was negative.     Assessment & Plan:   Principal Problem:   Pancolitis ([place]) Active Problems:   Dyspnea   Athscl autologous vein CABG w oth angina pectoris (HCC)   Chronic systolic HF (heart failure) (HCC)   S/P MVR (mitral valve replacement)   Granulosa cell tumor of ovary   Acute bronchitis   CKD (chronic kidney disease) stage 3, GFR 30-59 ml/min (HCC)   COPD (chronic obstructive pulmonary disease) (HCC)  Abdominal pain   #1 pancolitis Questionable etiology.  Patient presented with diarrhea and abdominal discomfort CT abdomen and pelvis consistent with a pancolitis.  CT abdomen and pelvis also consistent with peritoneal metastases.  Patient improving clinically with decreased abdominal pain.  Decreased frequency of stools and improved consistency with stools. Patient tolerating current clear liquids.  Leukocytosis slowly trending down.  C. difficile PCR is negative.  GI pathogen panel PCR is negative.  Continue empiric IV ciprofloxacin and IV Flagyl.  Gastroenterology following and will defer diet advancement to GI.  2.  Dyspnea/shortness of breath Likely secondary to worsening pulmonary hypertension and mitral stenosis as noted on 2D echo which showed a depressed EF of 35-40% from previous 45-50% with global hypokinesia.  Degenerated bioprosthetic mitral valve with severe stenosis of mild regurgitation.  Mean gradient of 21 mmHg up from 16 mmHg.  Moderate pulmonary hypertension with a gradient of 65 mmHg.  VQ scan done with low probability for PE.  Patient has been assessed by cardiology who feel patient will ultimately need a redo MVR and possible redo CABG however will likely need a right and left heart cardiac catheterization prior to that.  Cardiology trying to estimate patient's oncological prognosis to see how aggressive they can be with patient's cardiac treatment.  Oncology/hematology following.    3.  Hypokalemia Likely secondary to GI losses.  Repleted.  Keep magnesium greater than 2.   4.  History of nonischemic cardiomyopathy/status post AICD placement See problem #2.  Continue to hold spironolactone for now secondary to diarrhea and soft blood  pressure.  Cardiology following.    5.  Chronic kidney disease stage III Stable.  6.  History of ovarian cancer with CT scan showing peritoneal metastases Patient's oncologist, Dr.Ennever notified via epic of patient's admission.  7.  Chronic  anemia H&H stable.  Follow.    DVT prophylaxis: Lovenox Code Status: Full Family Communication: Updated patient.  No family at bedside. Disposition Plan: Once medically stable with clinical improvement.   Consultants:   Cardiology: Dr. Meda Coffee 06/05/1999  Gastroenterology Dr. Therisa Doyne 06/04/2017  Otology/oncology: Dr.Ennever 06/05/2017  Procedures:   VQ scan 06/04/2017  CT abdomen and pelvis 06/03/2017  Chest X-ray 06/03/2017  2D echo 06/04/2017    Antimicrobials:  IV ciprofloxacin 06/04/2017  IV Flagyl 06/03/2017  IV Zosyn 06/03/2017>>>> 06/03/2017   Subjective: Patient states abdominal pain improved.  Patient tolerating clairvoyant clear liquids.  No nausea or emesis.  Shortness of breath improved since admission.  Patient asking for solid food.  Patient states consistency of stools has improved.  Patient states decreased frequency of stools.   Objective: Vitals:   06/05/17 0501 06/05/17 0829 06/05/17 0832 06/05/17 1000  BP:    111/69  Pulse:    94  Resp:      Temp:      TempSrc:      SpO2:  93% 93%   Weight: 93.3 kg (205 lb 11 oz)     Height:        Intake/Output Summary (Last 24 hours) at 06/05/2017 1044 Last data filed at 06/05/2017 0831 Gross per 24 hour  Intake 940 ml  Output -  Net 940 ml   Filed Weights   06/03/17 2127 06/05/17 0501  Weight: 91.2 kg (201 lb) 93.3 kg (205 lb 11 oz)    Examination:  General exam: Appears calm and comfortable  Respiratory system: Lungs clear to auscultation bilaterally.  No wheezes, no crackles, no rhonchi.  Respiratory effort normal. Cardiovascular system: Regular rate and rhythm no murmurs rubs or gallops.  No pedal edema.  Gastrointestinal system: Abdomen is soft, nontender, nondistended, positive bowel sounds.  Central nervous system: Alert and oriented. No focal neurological deficits. Extremities: Symmetric 5 x 5 power. Skin: No rashes, lesions or ulcers Psychiatry: Judgement and insight appear  normal. Mood & affect appropriate.     Data Reviewed: I have personally reviewed following labs and imaging studies  CBC: Recent Labs  Lab 06/03/17 1241 06/03/17 2311 06/04/17 0047 06/05/17 0516  WBC 15.1* 14.8* 14.0* 13.2*  NEUTROABS 12.0*  --   --  9.6*  HGB 11.9* 10.9* 10.6* 11.3*  HCT 34.5* 32.0* 31.5* 32.9*  MCV 92.5 92.8 93.2 91.9  PLT 151 155 151 956   Basic Metabolic Panel: Recent Labs  Lab 06/03/17 1241 06/03/17 2311 06/04/17 0047 06/05/17 0516  NA 132*  --  133* 132*  K 3.4*  --  3.8 3.5  CL 98*  --  101 100*  CO2 24  --  21* 22  GLUCOSE 98  --  84 97  BUN 18  --  16 11  CREATININE 1.23* 1.26* 1.20* 1.05*  CALCIUM 8.6*  --  8.2* 8.3*   GFR: Estimated Creatinine Clearance: 68 mL/min (A) (by C-G formula based on SCr of 1.05 mg/dL (H)). Liver Function Tests: Recent Labs  Lab 06/03/17 1241 06/04/17 0047  AST 23 24  ALT 19 19  ALKPHOS 61 61  BILITOT 0.7 0.9  PROT 6.8 6.4*  ALBUMIN 3.4* 3.2*   Recent Labs  Lab 06/03/17 1241  LIPASE 29  No results for input(s): AMMONIA in the last 168 hours. Coagulation Profile: No results for input(s): INR, PROTIME in the last 168 hours. Cardiac Enzymes: Recent Labs  Lab 06/03/17 1241  TROPONINI <0.03   BNP (last 3 results) No results for input(s): PROBNP in the last 8760 hours. HbA1C: No results for input(s): HGBA1C in the last 72 hours. CBG: No results for input(s): GLUCAP in the last 168 hours. Lipid Profile: No results for input(s): CHOL, HDL, LDLCALC, TRIG, CHOLHDL, LDLDIRECT in the last 72 hours. Thyroid Function Tests: No results for input(s): TSH, T4TOTAL, FREET4, T3FREE, THYROIDAB in the last 72 hours. Anemia Panel: No results for input(s): VITAMINB12, FOLATE, FERRITIN, TIBC, IRON, RETICCTPCT in the last 72 hours. Sepsis Labs: Recent Labs  Lab 06/03/17 2311  LATICACIDVEN 0.6    Recent Results (from the past 240 hour(s))  C difficile quick scan w PCR reflex     Status: None   Collection  Time: 06/03/17  2:29 PM  Result Value Ref Range Status   C Diff antigen NEGATIVE NEGATIVE Final   C Diff toxin NEGATIVE NEGATIVE Final   C Diff interpretation No C. difficile detected.  Final  Gastrointestinal Panel by PCR , Stool     Status: None   Collection Time: 06/03/17  2:29 PM  Result Value Ref Range Status   Campylobacter species NOT DETECTED NOT DETECTED Final   Plesimonas shigelloides NOT DETECTED NOT DETECTED Final   Salmonella species NOT DETECTED NOT DETECTED Final   Yersinia enterocolitica NOT DETECTED NOT DETECTED Final   Vibrio species NOT DETECTED NOT DETECTED Final   Vibrio cholerae NOT DETECTED NOT DETECTED Final   Enteroaggregative E coli (EAEC) NOT DETECTED NOT DETECTED Final   Enteropathogenic E coli (EPEC) NOT DETECTED NOT DETECTED Final   Enterotoxigenic E coli (ETEC) NOT DETECTED NOT DETECTED Final   Shiga like toxin producing E coli (STEC) NOT DETECTED NOT DETECTED Final   Shigella/Enteroinvasive E coli (EIEC) NOT DETECTED NOT DETECTED Final   Cryptosporidium NOT DETECTED NOT DETECTED Final   Cyclospora cayetanensis NOT DETECTED NOT DETECTED Final   Entamoeba histolytica NOT DETECTED NOT DETECTED Final   Giardia lamblia NOT DETECTED NOT DETECTED Final   Adenovirus F40/41 NOT DETECTED NOT DETECTED Final   Astrovirus NOT DETECTED NOT DETECTED Final   Norovirus GI/GII NOT DETECTED NOT DETECTED Final   Rotavirus A NOT DETECTED NOT DETECTED Final   Sapovirus (I, II, IV, and V) NOT DETECTED NOT DETECTED Final         Radiology Studies: Dg Chest 2 View  Result Date: 06/03/2017 CLINICAL DATA:  Four days of shortness of breath. Current smoker. History of coronary artery disease and previous MI, childhood asthma, CHF, mitral valve replacement. 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 EXAM: CHEST  2 VIEW COMPARISON:  Chest x-ray of April 29, 2016 and CT scan chest of December 06, 2016. FINDINGS: The lungs are well-expanded. There is no focal  infiltrate. There is no pleural effusion. No pulmonary parenchymal masses are observed. The heart and pulmonary vascularity are normal. The patient has undergone previous mitral valve replacement. The ICD is in stable position. There is calcification in the wall of the aortic arch. There is no pleural effusion. The observed bony thorax exhibits no acute abnormality. IMPRESSION: Mild chronic bronchitic changes, stable. No pneumonia, CHF, nor other acute cardiopulmonary abnormality. Known soft tissue density pulmonary parenchymal masses seen on the CT scan of June 2018 are not clearly evident on today's study. Thoracic aortic atherosclerosis.  Electronically Signed   By: David  Martinique M.D.   On: 06/03/2017 11:56   Ct Abdomen Pelvis W Contrast  Result Date: 06/03/2017 CLINICAL DATA:  Abdominal pain and diarrhea. EXAM: CT ABDOMEN AND PELVIS WITH CONTRAST TECHNIQUE: Multidetector CT imaging of the abdomen and pelvis was performed using the standard protocol following bolus administration of intravenous contrast. CONTRAST:  100 cc of Omni 300 COMPARISON:  12/06/2016 FINDINGS: Lower chest: Mild interstitial prominence is identified within the lung bases bilaterally. No pleural effusion. Hepatobiliary: 1.2 cm low-attenuation structure and caudate lobe of liver is unchanged from 2016 and likely represents a small cyst. No suspicious liver abnormalities. The gallbladder appears tiny stones within the dependent portion of the gallbladder suspected. No gallbladder wall thickening. No biliary dilatation. Pancreas: Unremarkable. No pancreatic ductal dilatation or surrounding inflammatory changes. Spleen: Normal in size without focal abnormality. Adrenals/Urinary Tract: The adrenal glands appear within normal limits. Unremarkable appearance of both kidneys. No mass or hydronephrosis. Urinary bladder unremarkable. Stomach/Bowel: Small hiatal hernia. The small bowel loops have a normal course and caliber. There is abnormal wall  thickening and inflammation involving the entire colon up to the level of the rectum compatible with pancolitis. No pneumatosis or bowel perforation. No abscess identified. Vascular/Lymphatic: Aortic atherosclerosis. No aneurysm. Small retroperitoneal lymph nodes identified but no adenopathy. Reproductive: Previous hysterectomy.  No adnexal mass. Other: Multiple peritoneal nodules are identified and appear progressive when compared with previous exam. Adjacent to the ascending colon is a soft tissue nodule measuring 3.1 cm wall, image 55 of series 2. Previously 0.8 cm. Mass in the right side of pelvis measures, image 72 of series 2. Previously 3.9 cm. Central pelvic soft tissue lesion measures 4.1 cm, image 75 of series 2. Previously 3.7 cm. Left posterior pelvis nodule measures 2.7 cm, image 74 of series 2. Previously 2.5 cm. Musculoskeletal: Previous right total hip arthroplasty. No aggressive lytic or sclerotic bone lesions. IMPRESSION: 1. Examination is stool ball positive for pancolitis. No pneumatosis, perforation or abscess. 2. Progression of multifocal soft tissue nodularity compatible with peritoneal metastasis secondary to known ovarian cancer. 3.  Aortic Atherosclerosis (ICD10-I70.0). Electronically Signed   By: Kerby Moors M.D.   On: 06/03/2017 15:31   Nm Pulmonary Perf And Vent  Result Date: 06/04/2017 CLINICAL DATA:  Shortness of Breath EXAM: NUCLEAR MEDICINE VENTILATION - PERFUSION LUNG SCAN VIEWS: Anterior, posterior, left lateral, right lateral, RPO, LPO, RAO, LAO -ventilation and perfusion RADIOPHARMACEUTICALS:  32.4 mCi Technetium-2m DTPA aerosol inhalation and 4.3 mCi Technetium-69m MAA IV COMPARISON:  Chest radiograph June 03, 2017 FINDINGS: Ventilation: There are multiple subsegmental ventilation defects, slightly more on the right than on the left. No segmental ventilation defects are evident. Perfusion: There are scattered subsegmental defects, largest involving a portion of the  anterior segment of the left upper lobe. This defect matches the ventilation study. There are fewer perfusion defects than ventilation defects. There is no appreciable perfusion defect disproportionate to ventilation defects. IMPRESSION: Scattered subsegmental defects, more notable on the ventilation and perfusion study. Largest defect on the perfusion study is in the left upper lobe which matches the ventilation defect. There is no significant ventilation/perfusion mismatch. This study constitutes an overall low probability of pulmonary embolus based on PIOPED II guidelines. Electronically Signed   By: Lowella Grip III M.D.   On: 06/04/2017 14:23        Scheduled Meds: . albuterol  2.5 mg Nebulization BID  . aspirin  81 mg Oral q morning - 10a  . enoxaparin (LOVENOX)  injection  40 mg Subcutaneous QHS  . zolpidem  5 mg Oral QHS   Continuous Infusions: . ciprofloxacin Stopped (06/05/17 0952)  . metronidazole Stopped (06/05/17 7824)     LOS: 2 days    Time spent: 35 mins    Irine Seal, MD [facility] Pager [phone]  If 7PM-7AM, please contact night-coverage [email or web address] Password [facility] 06/05/2017, 10:44 AM

## 2017-06-05 NOTE — Progress Notes (Signed)
Progress Note  Patient Name: [name] Date of Encounter: 06/05/2017  Primary Cardiologist: Dr Tamala Julian / EP - Lovena Le  Subjective   Feeling marginally better this AM. Breathing is fine at rest, has not been doing much activity. Less discomfort in abdomen.  Inpatient Medications    Scheduled Meds: . albuterol  2.5 mg Nebulization BID  . aspirin  81 mg Oral q morning - 10a  . enoxaparin (LOVENOX) injection  40 mg Subcutaneous QHS  . zolpidem  5 mg Oral QHS   Continuous Infusions: . ciprofloxacin Stopped (06/05/17 0952)  . metronidazole Stopped (06/05/17 0952)   PRN Meds: acetaminophen **OR** acetaminophen, albuterol, fentaNYL (SUBLIMAZE) injection, LORazepam, ondansetron, ondansetron **OR** ondansetron (ZOFRAN) IV   Vital Signs    Vitals:   06/05/17 0501 06/05/17 0829 06/05/17 0832 06/05/17 1000  BP:    111/69  Pulse:    94  Resp:      Temp:      TempSrc:      SpO2:  93% 93%   Weight: 205 lb 11 oz (93.3 kg)     Height:        Intake/Output Summary (Last 24 hours) at 06/05/2017 1040 Last data filed at 06/05/2017 0831 Gross per 24 hour  Intake 940 ml  Output -  Net 940 ml   Filed Weights   06/03/17 2127 06/05/17 0501  Weight: 201 lb (91.2 kg) 205 lb 11 oz (93.3 kg)    Telemetry    NSR, one ventricular triplet - Personally Reviewed  Physical Exam   GEN: No acute distress.  HEENT: Normocephalic, atraumatic, sclera non-icteric. Neck: No JVD or bruits. Cardiac: RRR no murmurs, rubs, or gallops.  Radials/DP/PT 1+ and equal bilaterally.  Respiratory: Clear to auscultation bilaterally. Breathing is unlabored. GI: Soft, nontender, non-distended, BS +x 4. MS: no deformity. Extremities: No clubbing or cyanosis. No edema. Distal pedal pulses are 2+ and equal bilaterally. Neuro:  AAOx3. Follows commands. Psych:  Responds to questions appropriately with a normal affect.  Labs    Chemistry Recent Labs  Lab 06/03/17 1241 06/03/17 2311 06/04/17 0047  06/05/17 0516  NA 132*  --  133* 132*  K 3.4*  --  3.8 3.5  CL 98*  --  101 100*  CO2 24  --  21* 22  GLUCOSE 98  --  84 97  BUN 18  --  16 11  CREATININE 1.23* 1.26* 1.20* 1.05*  CALCIUM 8.6*  --  8.2* 8.3*  PROT 6.8  --  6.4*  --   ALBUMIN 3.4*  --  3.2*  --   AST 23  --  24  --   ALT 19  --  19  --   ALKPHOS 61  --  61  --   BILITOT 0.7  --  0.9  --   GFRNONAA 48* 46* 49* 58*  GFRAA 55* 54* 57* >60  ANIONGAP 10  --  11 10     Hematology Recent Labs  Lab 06/03/17 2311 06/04/17 0047 06/05/17 0516  WBC 14.8* 14.0* 13.2*  RBC 3.45* 3.38* 3.58*  HGB 10.9* 10.6* 11.3*  HCT 32.0* 31.5* 32.9*  MCV 92.8 93.2 91.9  MCH 31.6 31.4 31.6  MCHC 34.1 33.7 34.3  RDW 13.6 13.6 13.7  PLT 155 151 171    Cardiac Enzymes Recent Labs  Lab 06/03/17 1241  TROPONINI <0.03   No results for input(s): TROPIPOC in the last 168 hours.   BNP Recent Labs  Lab 06/03/17 2311  BNP 464.0*     DDimer  Recent Labs  Lab 06/04/17 0047  DDIMER 2.08*     Radiology    Dg Chest 2 View  Result Date: 06/03/2017 CLINICAL DATA:  Four days of shortness of breath. Current smoker. History of coronary artery disease and previous MI, childhood asthma, CHF, mitral valve replacement. 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 EXAM: CHEST  2 VIEW COMPARISON:  Chest x-ray of April 29, 2016 and CT scan chest of December 06, 2016. FINDINGS: The lungs are well-expanded. There is no focal infiltrate. There is no pleural effusion. No pulmonary parenchymal masses are observed. The heart and pulmonary vascularity are normal. The patient has undergone previous mitral valve replacement. The ICD is in stable position. There is calcification in the wall of the aortic arch. There is no pleural effusion. The observed bony thorax exhibits no acute abnormality. IMPRESSION: Mild chronic bronchitic changes, stable. No pneumonia, CHF, nor other acute cardiopulmonary abnormality. Known soft tissue density  pulmonary parenchymal masses seen on the CT scan of June 2018 are not clearly evident on today's study. Thoracic aortic atherosclerosis. Electronically Signed   By: David  Martinique M.D.   On: 06/03/2017 11:56   Ct Abdomen Pelvis W Contrast  Result Date: 06/03/2017 CLINICAL DATA:  Abdominal pain and diarrhea. EXAM: CT ABDOMEN AND PELVIS WITH CONTRAST TECHNIQUE: Multidetector CT imaging of the abdomen and pelvis was performed using the standard protocol following bolus administration of intravenous contrast. CONTRAST:  100 cc of Omni 300 COMPARISON:  12/06/2016 FINDINGS: Lower chest: Mild interstitial prominence is identified within the lung bases bilaterally. No pleural effusion. Hepatobiliary: 1.2 cm low-attenuation structure and caudate lobe of liver is unchanged from 2016 and likely represents a small cyst. No suspicious liver abnormalities. The gallbladder appears tiny stones within the dependent portion of the gallbladder suspected. No gallbladder wall thickening. No biliary dilatation. Pancreas: Unremarkable. No pancreatic ductal dilatation or surrounding inflammatory changes. Spleen: Normal in size without focal abnormality. Adrenals/Urinary Tract: The adrenal glands appear within normal limits. Unremarkable appearance of both kidneys. No mass or hydronephrosis. Urinary bladder unremarkable. Stomach/Bowel: Small hiatal hernia. The small bowel loops have a normal course and caliber. There is abnormal wall thickening and inflammation involving the entire colon up to the level of the rectum compatible with pancolitis. No pneumatosis or bowel perforation. No abscess identified. Vascular/Lymphatic: Aortic atherosclerosis. No aneurysm. Small retroperitoneal lymph nodes identified but no adenopathy. Reproductive: Previous hysterectomy.  No adnexal mass. Other: Multiple peritoneal nodules are identified and appear progressive when compared with previous exam. Adjacent to the ascending colon is a soft tissue nodule  measuring 3.1 cm wall, image 55 of series 2. Previously 0.8 cm. Mass in the right side of pelvis measures, image 72 of series 2. Previously 3.9 cm. Central pelvic soft tissue lesion measures 4.1 cm, image 75 of series 2. Previously 3.7 cm. Left posterior pelvis nodule measures 2.7 cm, image 74 of series 2. Previously 2.5 cm. Musculoskeletal: Previous right total hip arthroplasty. No aggressive lytic or sclerotic bone lesions. IMPRESSION: 1. Examination is stool ball positive for pancolitis. No pneumatosis, perforation or abscess. 2. Progression of multifocal soft tissue nodularity compatible with peritoneal metastasis secondary to known ovarian cancer. 3.  Aortic Atherosclerosis (ICD10-I70.0). Electronically Signed   By: Kerby Moors M.D.   On: 06/03/2017 15:31   Nm Pulmonary Perf And Vent  Result Date: 06/04/2017 CLINICAL DATA:  Shortness of Breath EXAM: NUCLEAR MEDICINE VENTILATION - PERFUSION LUNG SCAN VIEWS: Anterior, posterior, left lateral,  right lateral, RPO, LPO, RAO, LAO -ventilation and perfusion RADIOPHARMACEUTICALS:  32.4 mCi Technetium-31m DTPA aerosol inhalation and 4.3 mCi Technetium-70m MAA IV COMPARISON:  Chest radiograph June 03, 2017 FINDINGS: Ventilation: There are multiple subsegmental ventilation defects, slightly more on the right than on the left. No segmental ventilation defects are evident. Perfusion: There are scattered subsegmental defects, largest involving a portion of the anterior segment of the left upper lobe. This defect matches the ventilation study. There are fewer perfusion defects than ventilation defects. There is no appreciable perfusion defect disproportionate to ventilation defects. IMPRESSION: Scattered subsegmental defects, more notable on the ventilation and perfusion study. Largest defect on the perfusion study is in the left upper lobe which matches the ventilation defect. There is no significant ventilation/perfusion mismatch. This study constitutes an  overall low probability of pulmonary embolus based on PIOPED II guidelines. Electronically Signed   By: Lowella Grip III M.D.   On: 06/04/2017 14:23    Cardiac Studies   2D Echo 06/04/17 Study Conclusions  - Left ventricle: The cavity size was normal. Wall thickness was   normal. Systolic function was moderately reduced. The estimated   ejection fraction was in the range of 35% to 40%. Global   hypokinesis with basal to mid inferior akinesis. The study is not   technically sufficient to allow evaluation of LV diastolic   function. - Aortic valve: Sclerosis without stenosis. There was no   regurgitation. - Mitral valve: Bioprosthetic valve. Leaflets appear calcified with   minimal excursion. There is an increased mean transmitral   gradient of 21 mmHg and pressure 1/2t of 200 msec, suggestive of   severe mitral stenosis. Calculated AVA of 1.1 cm2 at a HR of 95.   There was mild regurgitation. Valve area by pressure half-time:   1.11 cm^2. - Left atrium: Moderately dilated. - Right ventricle: The cavity size was mildly dilated. Mildly   reduced RV systolic function. AICD wire noted in right ventricle. - Right atrium: The atrium was normal in size. AICD wire noted in   right atrium. - Tricuspid valve: There was moderate regurgitation. - Pulmonary arteries: PA peak pressure: 65 mm Hg (S). - Inferior vena cava: The vessel was dilated. The respirophasic   diameter changes were blunted (< 50%), consistent with elevated   central venous pressure.  Impressions:  - Compared to a prior study in 2016, the LVEF is lower at 35-40%   with global hypokinesis and inferior akinesis. AICD wires are   present. There is a degenerate bioprosthetic mitral valve with   severe stenosis and mild regurgitation. The leafelts are   calcified and poorly mobile. Mean gradient of 21 mmHg (up from 16   mmHg) with calculated AVA of around 1.1 cm2 at HR of 95 bpm,   moderate LAE and moderate pulmonary  hypertension with RVSP of 65   mmHg is noted.   Patient Profile     56 y.o. female with CAD with large RV/inferior MI complicated by pseudoaneurysm s/p aneurysemectomy and CABG 10/2009 with papillary muscle rupture requiring bioprosthetic mitral valve replacement in [facility], ventricular tachycardia, ICM and VT arrest 10/2009 s/p AICD (revision 01/2012 due to RV lead problem), chronic systolic CHF (EF 45% in 01/997, 45-50% in 05/2015), possible paroxysmal atrial fib/SVT, CKD stage III, thyroid disease, tobacco abuse, thymus enlargement s/p thymectomy, bladder cancer s/p resection 2009, active ovarian cancer (with progressive mets identified this admission). She was admitted with diarrhea x 1 week with diagnosis of pancolitis. Cardiology asked to  see for dyspnea. Echo notable for worsening LVEF and mitral stenosis.   Assessment & Plan    1. Pancolitis/diarrhea - per IM.  2. Chronic systolic CHF with worsening LV function, degenerative bioprosthetic mitral valve with severe stenosis and mild mitral regurgitation, and moderate pulm HTN - pt reports cyclical dyspnea this time of year, denies this as the reason for why she presented. She appears euvolemic on exam. Troponin was negative. VQ low probability for PE. Per Dr. Meda Coffee it would appear the patient would need right and left heart cath to further evaluate need for re-do MVR/possibly redo bypass. However, clinical situation is complicated by her ovarian cancer with progressive peritoneal metastasis. Oncology is on board and has not specifically commented on prognosis yet, but references plan for chest CT to evaluate for intrathoracic mets. Dr. Meda Coffee does not feel that her cardiac findings need to be evaluated acutely. I will review med regimen with Dr. Meda Coffee given hypotension this admission. Carvedilol, Lasix, spironolactone remain on hold. Not clear why she's not on ACEI/ARB but with hypotension, now would not be the time to start this just  yet.  3. CAD s/p CABG, aneurysmectomy, mitral valve replacement with tissue valve 2011 - as above. No recent chest pain. Continue ASA. Can discuss statin with primary cardiologist as OP.  4. PAF/SVT - low burden per EP notes, being followed for now - can f/u Dr. Tamala Julian to further review anticoagulation. Given mitral stenosis, if chosen, would not be a candidate for NOAC; would need warfarin instead. Telemetry this admission reveals NSR.  5. PVCs - one ventricular triplet noted on telemetry. K 3.5. Lytes have been managed by IM. Will replete with 17meq today, check Mg, and order labs for AM.  Please let the team know when patient is being discharged so we can arrange close f/u with Dr. Tamala Julian.  For questions or updates, please contact [facility] Please consult [email or web address] for contact info under Cardiology/STEMI.  Signed, Charlie Pitter, PA-C 06/05/2017, 10:40 AM    The patient was seen, examined and discussed with Melina Copa, PA-C and I agree with the above.   57 y.o.femalewith a hx of CAD with large RV/inferior MI complicated by pseudoaneurysm s/p aneurysemectomy and CABG 10/2009 with papillary muscle rupture requiring bioprosthetic mitral valve replacement in [facility], ventricular tachycardia, ICM and VT arrest 10/2009 s/p AICD (revision 01/2012 due to RV lead problem), chronic systolic CHF, possible paroxysmal atrial fib/SVT, CKD stage III, thyroid disease, tobacco abuse, thymus enlargement s/p thymectomy, bladder cancer s/p resection 2009, active ovarian cancer (with progressive mets identified this admission)who is being seen today for the evaluation of SOBat the request ofDr. Hal Hope. 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  She has been experiencing on and off orthopnea and PND, no LE edema, no chest pain, palpitations or syncope.   V/Q scan was low  risk for pulmonary embolism.  On physical exam she has regular rate and rhythm, mild diastolic murmur, no S4, minimal crackles at the bases, no LE edema. ECG shows SR, NSIVCD, non-specific ST T Wave abnormalities, unchaged from 2017. Her echocardiogram shows LVEF is lower at 35-40%, previously 45-50%, with global hypokinesis and inferior akinesis. There is a degenerate bioprosthetic mitral valve with severe stenosis and mild regurgitation. The leafelts are calcified and poorly mobile. Mean gradient of 21 mmHg (up from 16 mmHg) with  calculated AVA of around 1.1 cm2 at HR of 95 bpm, moderate LAE and moderate pulmonary hypertension with RVSP of 65 mmHg is noted.  She has been hypotensive in the hospital and all of her cardiac meds have been held. The patient doesn't need additional workup in the acute settings.  However, her LVEF is deteriorating, her bioprosthetic mitral valve is now severely stenosed and pulmonary hypertension is worsening. Ultimately she will need a re-do MVR and possibly redo CABG - if she is considered for redo MVR we will arrange for a right and left cardiac catheterization. Further plan depends on her prognosis given metastatic cancer. We would highly appreciate a prognosis estimate by oncology so we know how aggressive we can be with her cardiac therapy.  She can be discharged from cardiac standpoint, we will arrange for a follow up with Dr Tamala Julian, by then we should have oncology staging back and will have more information regarding prognosis.  Her BP is soft, however I would restart carvedilol 3.125 mg po BID and start losartan 12.5 mg po daily.   Ena Dawley, MD 06/05/2017

## 2017-06-05 NOTE — Consult Note (Signed)
Referral MD  Reason for Referral: Diarrhea secondary to colitis-on determine etiology; recurrent granulosa cell tumor; cardiac history with AICD.  Chief Complaint  Patient presents with  . Diarrhea  : Started having diarrhea.  HPI: [name] is well-known to me.  She is a very nice 56 year old white female.  I have seen her in the past for recurrent granulosa cell tumor.  She has seen Dr. Aldean Ast at [facility].  She has had surgery.  She has had chemotherapy.  She has not had chemotherapy probably for a couple years.  She had been on tamoxifen and Megace.  She has not seen [place] for probably 6 months.  We have been following her Inhibin B level.  She had been doing well.  She has been working.  She had no problems for Thanksgiving.  Week or so ago, she began to have diarrhea.  It got to the point where she was standing in the bathroom.  She was admitted a couple days ago.  She did have a CT of the abdomen.  This did show pan colitis.  She has had C. difficile that is negative.  A viral panel has been sent off.  She has been started on antibiotics with ciprofloxacin and Flagyl.  Her labs today show white cell count of 13.2.  Hemoglobin 11.3.  Platelet count 171,000.  Her sodium is 132.  Potassium 3.5.  Creatinine 1.05.  She currently is on clear liquids.  She is having no fever.  There is no bleeding.  There is no dysuria.  She has had no rashes.  She has had some shortness of breath.  Cardiology has seen her.  Overall, her performance status is ECOG 1.   Past Medical History:  Diagnosis Date  . Arthritis 02/06/2012   "everywhere"  . Asthma    as a child  . Automatic implantable cardioverter-defibrillator in situ    DR. Beckie Salts   . Bladder cancer [facility]) 2009   "injected medicine to get rid of it"  . Chronic systolic CHF (congestive heart failure) ([place])   . CKD (chronic kidney disease), stage III ([place])   . Coronary artery disease    a. large RV/inferior MI complicated by  pseudoaneurysm s/p aneurysemectomy and CABG 10/2009 with papillary muscle rupture requiring bioprosthetic mitral valve replacement in [facility].  . 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 H/O thymectomy   . High cholesterol   . History of mitral valve replacement with tissue graft 2011  . Hypothyroidism   . ICD (implantable cardiac defibrillator) in place   . Ischemic cardiomyopathy    a.  ICM and VT arrest 10/2009 s/p AICD (revision 01/2012 due to RV lead problem).  . Myocardial infarction ([place]) 2011  . Neuropathy    FEET AND HANDS - FROM CHEMO - BUT MUCH IMPROVED  . PAF (paroxysmal atrial fibrillation) ([place])   . Pain    JOINT PAINS AND MUSCLE ACHES ALL OVER.  Marland Kitchen Port-A-Cath in place    RIGHT UPPER CHEST  . SVT (supraventricular tachycardia) ([place])   . Tobacco abuse   . Ventricular tachycardia ([place])   :  Past Surgical History:  Procedure Laterality Date  . ABDOMINAL HYSTERECTOMY N/A 07/27/2013   Procedure: TUMOR EXCISION OF ABDOMINAL MASS;  Surgeon: Alvino Chapel, MD;  Location: [facility];  Service: Gynecology;  Laterality: N/A;  . APPENDECTOMY  1989  . BILATERAL OOPHORECTOMY  2007  . CARDIAC DEFIBRILLATOR PLACEMENT  02/06/2012   "  lead change"  . CARDIAC VALVE REPLACEMENT    . CYSTOSCOPY Right 02/23/2013   Procedure: CYSTOSCOPY WITH STENT REMOVAL;  Surgeon: Alvino Chapel, MD;  Location: [facility];  Service: Gynecology;  Laterality: Right;  . IMPLANTABLE CARDIOVERTER DEFIBRILLATOR REVISION N/A 02/06/2012   Procedure: IMPLANTABLE CARDIOVERTER DEFIBRILLATOR REVISION;  Surgeon: Evans Lance, MD;  Location: [facility] CATH LAB;  Service: Cardiovascular;  Laterality: N/A;  . INSERT / REPLACE / REMOVE PACEMAKER  10/2009   DUAL-CHAMBER St. JUDE DEFIBRILLATOR  . LAPAROTOMY N/A 01/19/2013   Procedure: TUMOR DEBULKING / BOWEL RESECTION/INSERTION RIGHT UTERERAL DOUBLE J STENT/OMENTECTOMY;  Surgeon: Alvino Chapel, MD;  Location: [facility];  Service:  Gynecology;  Laterality: N/A;  . MITRAL VALVE REPLACEMENT  2011   "pig valve"  . THYMUS REMOVED    . TONSILLECTOMY AND ADENOIDECTOMY  ~ 1967  . TOTAL HIP ARTHROPLASTY  05/06/2012   Procedure: TOTAL HIP ARTHROPLASTY;  Surgeon: Gearlean Alf, MD;  Location: [facility];  Service: Orthopedics;  Laterality: Right;  . TRANSURETHRAL RESECTION OF BLADDER  2009   "for bladder cancer"  . TUBAL LIGATION  1993  . VAGINAL HYSTERECTOMY  2001  :   Current Facility-Administered Medications:  .  acetaminophen (TYLENOL) tablet 650 mg, 650 mg, Oral, Q6H PRN **OR** acetaminophen (TYLENOL) suppository 650 mg, 650 mg, Rectal, Q6H PRN, Rise Patience, MD .  albuterol (PROVENTIL) (2.5 MG/3ML) 0.083% nebulizer solution 2.5 mg, 2.5 mg, Nebulization, Q4H PRN, Rise Patience, MD, 2.5 mg at 06/04/17 2016 .  aspirin EC tablet 81 mg, 81 mg, Oral, q morning - 10a, Ronnette Juniper, MD .  ciprofloxacin (CIPRO) IVPB 400 mg, 400 mg, Intravenous, Q12H, Minda Ditto, RPH, Stopped at 06/04/17 2224 .  enoxaparin (LOVENOX) injection 40 mg, 40 mg, Subcutaneous, QHS, Rise Patience, MD, 40 mg at 06/04/17 2253 .  fentaNYL (SUBLIMAZE) injection 50 mcg, 50 mcg, Intravenous, Q2H PRN, Ward, Kristen N, DO, 50 mcg at 06/03/17 2146 .  LORazepam (ATIVAN) tablet 0.5 mg, 0.5 mg, Oral, Q6H PRN, Rise Patience, MD .  metroNIDAZOLE (FLAGYL) IVPB 500 mg, 500 mg, Intravenous, Q8H, Thurnell Lose, MD, Stopped at 06/05/17 0240 .  ondansetron (ZOFRAN) injection 4 mg, 4 mg, Intravenous, Q6H PRN, Ward, Kristen N, DO .  ondansetron (ZOFRAN) tablet 4 mg, 4 mg, Oral, Q6H PRN **OR** ondansetron (ZOFRAN) injection 4 mg, 4 mg, Intravenous, Q6H PRN, Rise Patience, MD, 4 mg at 06/05/17 0533 .  zolpidem (AMBIEN) tablet 5 mg, 5 mg, Oral, QHS, Rise Patience, MD, 5 mg at 06/04/17 2253:  . aspirin  81 mg Oral q morning - 10a  . enoxaparin (LOVENOX) injection  40 mg Subcutaneous QHS  . zolpidem  5 mg Oral QHS  :  Allergies   Allergen Reactions  . Prochlorperazine Edisylate Other (See Comments)    Nervous/ flutter/ shakes--compazine  . Adhesive [Tape] Other (See Comments)    Redness/skin peeling Redness/hives from adhesive tape( tolerates latex gloves); tolerates paper tape  . Prochlorperazine Other (See Comments)    Jitters, hyper  :  Family History  Problem Relation Age of Onset  . Heart attack Paternal Grandfather   . Heart attack Maternal Grandfather   . Stroke Maternal Grandmother   . Heart attack Brother   :  Social History   Socioeconomic History  . Marital status: Divorced    Spouse name: Not on file  . Number of children: Not on file  . Years of education: Not on file  . Highest  education level: Not on file  Social Needs  . Financial resource strain: Not on file  . Food insecurity - worry: Not on file  . Food insecurity - inability: Not on file  . Transportation needs - medical: Not on file  . Transportation needs - non-medical: Not on file  Occupational History    Comment: WORKS FULL TIME  Tobacco Use  . Smoking status: Current Some Day Smoker    Packs/day: 0.50    Years: 30.00    Pack years: 15.00    Types: Cigarettes    Start date: 03/28/1974  . Smokeless tobacco: Never Used  Substance and Sexual Activity  . Alcohol use: No    Alcohol/week: 0.0 oz  . Drug use: No  . Sexual activity: Not on file  Other Topics Concern  . Not on file  Social History Narrative   WORKS FULL TIME   SINGLE   TOBACCO USE-YES   IMPLANTATION OF DUAL-CHAMBER St. JUDE DEFIBRILLATOR  :  Pertinent items are noted in HPI.  Exam: As stated above. Patient Vitals for the past 24 hrs:  BP Temp Temp src Pulse Resp SpO2 Weight  06/05/17 0501 - - - - - - 205 lb 11 oz (93.3 kg)  06/05/17 0437 94/65 98.3 F (36.8 C) Oral 99 18 94 % -  06/04/17 2125 (!) 105/59 99.6 F (37.6 C) Oral (!) 103 17 96 % -  06/04/17 2016 - - - - - 93 % -  06/04/17 1443 113/69 97.7 F (36.5 C) Oral (!) 102 18 95 % -   06/04/17 0845 - - - - - 94 % -  06/04/17 0841 - - - - - 94 % -     Recent Labs    06/04/17 0047 06/05/17 0516  WBC 14.0* 13.2*  HGB 10.6* 11.3*  HCT 31.5* 32.9*  PLT 151 171   Recent Labs    06/04/17 0047 06/05/17 0516  NA 133* 132*  K 3.8 3.5  CL 101 100*  CO2 21* 22  GLUCOSE 84 97  BUN 16 11  CREATININE 1.20* 1.05*  CALCIUM 8.2* 8.3*    Blood smear review: None  Pathology: None    Assessment and Plan: Ms. Brickhouse is a very nice 56 year old white female.  She has recurrent granulosa cell tumor.  She has had 2 or 3 recurrences.  She has had no therapy for about a year.  Clearly, she has growth.  There is not a bulky disease.  I am not sure why she has this colitis.  I would not think that this is from the malignancy.  I am not seeing any type of paraneoplastic process from granulosa cell tumors.  As far as treating the granulosa cell tumor, I will have to talk with her gynecologic oncologist at [facility].  She might need chemotherapy again.  Hopefully, this colitis will improve.  I think that she might need a colonoscopy and biopsy.  I do not think this is any type of inflammatory bowel disease.  We will follow along.  I probably would get a CT of her chest make sure that nothing is going on with metastatic disease to her chest.  I will also send off an Inhibin B level.  Lattie Haw, MD  Oswaldo Milian 52:7

## 2017-06-05 NOTE — Progress Notes (Signed)
Back from CT.Dr Grandville Silos made aware of mag=1.7,no new orders

## 2017-06-06 DIAGNOSIS — Z952 Presence of prosthetic heart valve: Secondary | ICD-10-CM

## 2017-06-06 LAB — BASIC METABOLIC PANEL
ANION GAP: 6 (ref 5–15)
BUN: 8 mg/dL (ref 6–20)
CALCIUM: 8.3 mg/dL — AB (ref 8.9–10.3)
CO2: 24 mmol/L (ref 22–32)
CREATININE: 1.05 mg/dL — AB (ref 0.44–1.00)
Chloride: 104 mmol/L (ref 101–111)
GFR, EST NON AFRICAN AMERICAN: 58 mL/min — AB (ref >60)
Glucose, Bld: 99 mg/dL (ref 65–99)
Potassium: 3.8 mmol/L (ref 3.5–5.1)
SODIUM: 134 mmol/L — AB (ref 135–145)

## 2017-06-06 LAB — CBC
HCT: 32 % — ABNORMAL LOW (ref 36.0–46.0)
Hemoglobin: 10.9 g/dL — ABNORMAL LOW (ref 12.0–15.0)
MCH: 31.6 pg (ref 26.0–34.0)
MCHC: 34.1 g/dL (ref 30.0–36.0)
MCV: 92.8 fL (ref 78.0–100.0)
PLATELETS: 189 10*3/uL (ref 150–400)
RBC: 3.45 MIL/uL — ABNORMAL LOW (ref 3.87–5.11)
RDW: 13.9 % (ref 11.5–15.5)
WBC: 11.5 10*3/uL — ABNORMAL HIGH (ref 4.0–10.5)

## 2017-06-06 LAB — MAGNESIUM: MAGNESIUM: 2.3 mg/dL (ref 1.7–2.4)

## 2017-06-06 MED ORDER — CLOTRIMAZOLE 1 % VA CREA
1.0000 | TOPICAL_CREAM | Freq: Every day | VAGINAL | Status: DC
Start: 2017-06-06 — End: 2017-06-06
  Administered 2017-06-06: 1 via VAGINAL
  Filled 2017-06-06: qty 45

## 2017-06-06 MED ORDER — CARVEDILOL 3.125 MG PO TABS: 3.1250 mg | ORAL_TABLET | Freq: Two times a day (BID) | ORAL | 1 refills | Status: DC

## 2017-06-06 MED ORDER — METRONIDAZOLE 500 MG PO TABS: 500.0000 mg | ORAL_TABLET | Freq: Three times a day (TID) | ORAL | 0 refills | Status: AC

## 2017-06-06 MED ORDER — METRONIDAZOLE 500 MG PO TABS
500.0000 mg | ORAL_TABLET | Freq: Three times a day (TID) | ORAL | Status: DC
  Filled 2017-06-06: qty 1

## 2017-06-06 MED ORDER — CIPROFLOXACIN HCL 500 MG PO TABS: 500.0000 mg | ORAL_TABLET | Freq: Two times a day (BID) | ORAL | 0 refills | Status: AC

## 2017-06-06 MED ORDER — ALBUTEROL SULFATE HFA 108 (90 BASE) MCG/ACT IN AERS: 2.0000 | INHALATION_SPRAY | RESPIRATORY_TRACT | 1 refills | Status: DC | PRN

## 2017-06-06 MED ORDER — CIPROFLOXACIN HCL 500 MG PO TABS: 500.0000 mg | ORAL_TABLET | Freq: Two times a day (BID) | ORAL | Status: DC

## 2017-06-06 MED ORDER — FLUCONAZOLE 150 MG PO TABS
150.0000 mg | ORAL_TABLET | Freq: Once | ORAL | Status: AC
  Administered 2017-06-06: 150 mg via ORAL
  Filled 2017-06-06: qty 1

## 2017-06-06 MED ORDER — ASPIRIN EC 325 MG PO TBEC: 325.0000 mg | DELAYED_RELEASE_TABLET | Freq: Every morning | ORAL | Status: DC

## 2017-06-06 NOTE — Progress Notes (Signed)
[name] says that her diarrhea is better.  She still is on some clear liquids.  She really wants to go home.  She is having no abdominal pain.  We did do a CT of the chest.  She had some mild adenopathy in the hilum and mediastinum.  This certainly could represent metastatic disease.  I will have speak to her gynecologic oncologist at [facility] and let him know what is going on.  Her labs all look pretty good.  White cell count 11.5.  Hemoglobin 10.9.  Platelet count 189,000.  Creatinine 1.05.  Potassium 3.8.  She has had no vomiting.  She is out of bed.  There has been no fever.  Is been no bleeding.  Her vital signs all look stable.  Her blood pressure is 98/61.  Pulse 91.  Temperature 97.6.  Her lungs are clear.  Cardiac exam regular rate and rhythm.  She has no murmurs.  Abdomen is soft.  Bowel sounds are present.  There is no guarding or rebound tenderness.  There is no fluid wave.  Extremities shows no clubbing, cyanosis or edema.  [name] has diarrhea.  She had colitis on the CT scan.  She is improving.  It does not sound like she needs a colonoscopy.  I will get her back to the office after [place].  I will speak with Dr. Aldean Ast.  Hopefully, we can come up with a game plan that does not include chemotherapy.  I appreciate all the wonderful care that she is gotten from the staff up on [address].  Stacy Haw, MD  Psalms 18:1

## 2017-06-06 NOTE — Discharge Summary (Signed)
Physician Discharge Summary  [name] IPJ:[id number] DOB: January 05, 1961 DOA: 06/03/2017  PCP: Default, Provider, MD  Admit date: 06/03/2017 Discharge date: 06/06/2017  Time spent: 60 minutes  Recommendations for Outpatient Follow-up:  1. Follow-up with Dr. Daneen Schick, cardiology.  Office will call with an appointment time.  Follow-up patient's blood pressure will need to be reassessed as patient was only discharged on Coreg due to soft blood pressure.  Patient will need a basic metabolic profile done to follow-up on electrolytes and renal function. 2. Follow-up with Dr.Karki, Gastroenterology.  Patient will need repeat outpatient colonoscopy.  Patient will be called with appointment time. 3. Follow-up with Dr. Marin Olp as scheduled.   Discharge Diagnoses:  Principal Problem:   Pancolitis ([place]) Active Problems:   Dyspnea   Athscl autologous vein CABG w oth angina pectoris (HCC)   Chronic systolic HF (heart failure) (HCC)   S/P MVR (mitral valve replacement)   Granulosa cell tumor of ovary   Acute bronchitis   CKD (chronic kidney disease) stage 3, GFR 30-59 ml/min (HCC)   COPD (chronic obstructive pulmonary disease) (HCC)   Abdominal pain   Discharge Condition: Stable and improved  Diet recommendation: Heart healthy  Filed Weights   06/03/17 2127 06/05/17 0501 06/06/17 0547  Weight: 91.2 kg (201 lb) 93.3 kg (205 lb 11 oz) 91.4 kg (201 lb 8 oz)    History of present illness:  Per Dr. Mammie Russian is a 56 y.o. female with known history of nonischemic cardiomyopathy status post AICD placement, tissue mitral valve replacement, paroxysmal atrial fibrillation, chronic kidney disease chronic anemia and history of ovarian cancer presented to the ER at [facility] with complaints of persistent diarrhea multiple episodes for last 4 days.  Last 2 days patient also had been having increasing shortness of breath.  Shortness of breath present even at rest no  associated chest pain productive cough fever or chills.  Patient did get 1 dose of amoxicillin prior to her dental appointment last week.  Patient diarrhea was associated with some abdominal discomfort denied any vomiting.  Denied any blood in the diarrhea.  ED Course: In the ER patient had a CT scan of the abdomen which showed pancolitis and also features concerning for peritoneal metastasis.  Stool for C. difficile has been negative.  GI pathogen panel was pending.  Patient was given fluids in the ER and also potassium replacement due to hypokalemia.  Patient shortness of breath patient feels that the symptoms happen when she gets bronchitis and was given nebulizer following which it improved but at the time of my exam patient also had mild shortness of breath.  Patient was being admitted for persistent diarrhea and shortness of breath.  EKG showed normal sinus rhythm troponin was negative.    Hospital Course:  #1 pancolitis Questionable etiology.  Patient presented with diarrhea and abdominal discomfort CT abdomen and pelvis consistent with a pancolitis.  CT abdomen and pelvis also consistent with peritoneal metastases.  Patient was placed empirically on gentle IV fluid hydration as well as empiric IV ciprofloxacin and Flagyl.  Patient was also initially placed on a clear liquids.  C. difficile PCR which was obtained was negative.  GI pathogen panel PCR was negative.  Leukocytosis trended down.  Patient improved clinically and diet was advanced to a soft diet which he tolerated.  Gastroenterology was consulted who followed the patient throughout the hospitalization.  Was recommended per gastroenterology to treat patient for total of 10 days of ciprofloxacin  and Flagyl on discharge.  Patient was subsequently transitioned to oral ciprofloxacin and Flagyl which he tolerated.  Patient will be discharged home on 8 more days of oral ciprofloxacin and Flagyl to complete a 10-day course of antibiotic  treatment.  Patient will follow up with Dr.Karki gastroenterology in the outpatient setting.  Patient will be scheduled for outpatient colonoscopy.  Patient will be discharged home in stable and improved condition.    2.  Dyspnea/shortness of breath Likely secondary to worsening pulmonary hypertension and mitral stenosis as noted on 2D echo which showed a depressed EF of 35-40% from previous 45-50% with global hypokinesia.  Degenerated bioprosthetic mitral valve with severe stenosis of mild regurgitation.  Mean gradient of 21 mmHg up from 16 mmHg.  Moderate pulmonary hypertension with a gradient of 65 mmHg.  VQ scan done with low probability for PE.  Patient has been assessed by cardiology who feel patient will ultimately need a redo MVR and possible redo CABG however will likely need a right and left heart cardiac catheterization prior to that.  Cardiology trying to estimate patient's oncological prognosis to see how aggressive they can be with patient's cardiac treatment.  Oncology/hematology following.    Outpatient follow-up with cardiology.  3.  Hypokalemia Likely secondary to GI losses.  Repleted.    4.  History of nonischemic cardiomyopathy/status post AICD placement See problem #2.    Patient spironolactone was held during the hospitalization due to her soft blood pressure.  Patient was seen in consultation by cardiology.  Patient was started on low-dose Coreg 3.125 mg twice daily and losartan.  However due to soft blood pressure losartan has been discontinued.  Patient will be discharged home on Coreg only.  Outpatient follow-up with her cardiologist.    5.  Chronic kidney disease stage III Stable.  6.  History of ovarian cancer with CT scan showing peritoneal metastases Patient's oncologist, Dr.Ennever followed the patient throughout the hospitalization and will see the patient in outpatient follow-up.  CT chest was done on 06/05/2017 that showed some adenopathy in the hilum and  mediastinum with concerns for possible metastatic disease.  Oncologist will contact patient's gynecological oncologist at [facility] and further workup will be done in the outpatient setting.  7.  Chronic anemia Hemoglobin remained stable throughout the hospitalization.  Outpatient follow-up.     Procedures:  VQ scan 06/04/2017  CT abdomen and pelvis 06/03/2017  Chest X-ray 06/03/2017  2D echo 06/04/2017  CT chest 06/05/2017      Consultations:  Cardiology: Dr. Meda Coffee 06/05/1999  Gastroenterology Dr. Therisa Doyne 06/04/2017  Otology/oncology: Dr.Ennever 06/05/2017      Discharge Exam: Vitals:   06/06/17 0900 06/06/17 0901  BP: (!) 90/59 92/60  Pulse:    Resp:    Temp:    SpO2:      General: NAD Cardiovascular: RRR Respiratory: CTAB  Discharge Instructions   Discharge Instructions    Diet - low sodium heart healthy   Complete by:  As directed    Increase activity slowly   Complete by:  As directed      Allergies as of 06/06/2017      Reactions   Prochlorperazine Edisylate Other (See Comments)   Nervous/ flutter/ shakes--compazine   Adhesive [tape] Other (See Comments)   Redness/skin peeling Redness/hives from adhesive tape( tolerates latex gloves); tolerates paper tape   Prochlorperazine Other (See Comments)   Jitters, hyper      Medication List    STOP taking these medications   furosemide 20  MG tablet Commonly known as:  LASIX   spironolactone 25 MG tablet Commonly known as:  ALDACTONE     TAKE these medications   albuterol 108 (90 Base) MCG/ACT inhaler Commonly known as:  PROVENTIL HFA;VENTOLIN HFA Inhale 2 puffs into the lungs every 4 (four) hours as needed for wheezing or shortness of breath.   aspirin 325 MG EC tablet Take 325 mg by mouth every morning. Reported on 10/20/2015   b complex vitamins tablet Take 1 tablet by mouth daily.   carvedilol 3.125 MG tablet Commonly known as:  COREG Take 1 tablet (3.125 mg total) by mouth  2 (two) times daily with a meal. What changed:    medication strength  how much to take  how to take this  when to take this  additional instructions  Another medication with the same name was removed. Continue taking this medication, and follow the directions you see here.   ciprofloxacin 500 MG tablet Commonly known as:  CIPRO Take 1 tablet (500 mg total) by mouth 2 (two) times daily for 8 days.   diphenhydrAMINE 25 MG tablet Commonly known as:  BENADRYL Take 25 mg by mouth as needed for itching or allergies. ALLERGIES   LORazepam 0.5 MG tablet Commonly known as:  ATIVAN TAKE 1 TABLET BY MOUTH EVERY 6 HOURS AS NEEDED FOR NAUSEA AND VOMITING   metroNIDAZOLE 500 MG tablet Commonly known as:  FLAGYL Take 1 tablet (500 mg total) by mouth every 8 (eight) hours for 8 days.   MULTIVITAMIN PO Take 1 tablet by mouth daily.   Vitamin D (Ergocalciferol) 50000 units Caps capsule Commonly known as:  DRISDOL TAKE 1 CAPSULE (50,000 UNITS TOTAL) BY MOUTH EVERY 7 (SEVEN) DAYS.   VITAMIN D-400 400 units Tabs tablet Generic drug:  cholecalciferol Take 400 Units by mouth 2 (two) times daily.   zolpidem 5 MG tablet Commonly known as:  AMBIEN TAKE ONE TABLET BY MOUTH AT BEDTIME      Allergies  Allergen Reactions  . Prochlorperazine Edisylate Other (See Comments)    Nervous/ flutter/ shakes--compazine  . Adhesive [Tape] Other (See Comments)    Redness/skin peeling Redness/hives from adhesive tape( tolerates latex gloves); tolerates paper tape  . Prochlorperazine Other (See Comments)    Jitters, hyper   Follow-up Information    Belva Crome, MD Follow up.   Specialty:  Cardiology Why:  Office will call you to arrange follow-up. Call the office if you have not heard back in 3 days. Contact information: [address] [address] [phone]        Ronnette Juniper, MD Follow up.   Specialty:  Gastroenterology Why:  office will call with  appointment time for colonscopy and outpatient follow up. Contact information: [place] [address] [phone]        Volanda Napoleon, MD Follow up.   Specialty:  Oncology Why:  office will call with appointment time Contact information: [place] [address] [phone]            The results of significant diagnostics from this hospitalization (including imaging, microbiology, ancillary and laboratory) are listed below for reference.    Significant Diagnostic Studies: Dg Chest 2 View  Result Date: 06/03/2017 CLINICAL DATA:  Four days of shortness of breath. Current smoker. History of coronary artery disease and previous MI, childhood asthma, CHF, mitral valve replacement. History of granulosa cell tumor metastatic to the  lungs. EXAM: CHEST  2 VIEW COMPARISON:  Chest x-ray of April 29, 2016 and CT scan chest of December 06, 2016. FINDINGS: The lungs are well-expanded. There is no focal infiltrate. There is no pleural effusion. No pulmonary parenchymal masses are observed. The heart and pulmonary vascularity are normal. The patient has undergone previous mitral valve replacement. The ICD is in stable position. There is calcification in the wall of the aortic arch. There is no pleural effusion. The observed bony thorax exhibits no acute abnormality. IMPRESSION: Mild chronic bronchitic changes, stable. No pneumonia, CHF, nor other acute cardiopulmonary abnormality. Known soft tissue density pulmonary parenchymal masses seen on the CT scan of June 2018 are not clearly evident on today's study. Thoracic aortic atherosclerosis. Electronically Signed   By: David  Martinique M.D.   On: 06/03/2017 11:56   Ct Chest W Contrast  Result Date: 06/05/2017 CLINICAL DATA:  Inpatient. Metastatic granulosa cell ovarian carcinoma. Dyspnea. Chest staging. EXAM: CT CHEST WITH CONTRAST TECHNIQUE: Multidetector CT imaging of the chest was performed  during intravenous contrast administration. CONTRAST:  41mL ISOVUE-300 IOPAMIDOL (ISOVUE-300) INJECTION 61% COMPARISON:  12/06/2016 CT chest.  06/03/2017 chest radiograph. FINDINGS: Cardiovascular: Top-normal heart size. No significant pericardial fluid/thickening. Three lead left subclavian ICD is noted with lead tips in the right atrium and right ventricle. Mitral valve prosthesis is in place. Coronary atherosclerosis. Atherosclerotic nonaneurysmal thoracic aorta. Stable top-normal caliber main pulmonary artery (3.0 cm diameter) . No central pulmonary emboli. Mediastinum/Nodes: No discrete thyroid nodules. Unremarkable esophagus. No axillary adenopathy. Right paratracheal adenopathy up to 1.5 cm (series 2/ image 52), previously 1.2 cm, mildly increased. Newly enlarged 1.7 cm subcarinal node (series 2/ image 67). New mild bilateral hilar adenopathy measuring up to 1.1 cm on the right (series 2/image 64) and 1.1 cm on the left (series 2/image 71). Lungs/Pleura: No pneumothorax. New small dependent bilateral pleural effusions, left greater than right. Mild centrilobular emphysema with mild diffuse bronchial wall thickening. Moderate interlobular septal thickening throughout both lungs has increased since 12/06/2016 chest CT, relatively symmetrically involving the lungs. Hypoventilatory changes are noted in the dependent lungs. Subsolid 1.0 cm posterior right upper lobe (series 7/ image 41) and 0.5 cm left upper lobe (series 7/ image 42) pulmonary nodules are stable using similar measurement technique. No new significant pulmonary nodules. No lung masses. No acute consolidative airspace disease. Upper abdomen: Small hiatal hernia. Simple 1.3 cm caudate lobe liver cyst. Musculoskeletal: No aggressive appearing focal osseous lesions. Loose intra-articular osseous bodies are again noted throughout the left glenohumeral joint. Intact sternotomy wires. Moderate thoracic spondylosis. IMPRESSION: 1. Nonspecific interlobular  septal thickening throughout both lungs, increased since 12/06/2016 chest CT. In a patient with a history of ovarian cancer, this finding raises concern for lymphangitic tumor spread, however the patient also has a history of cardiac valvular disease and the findings may simply represent mild pulmonary edema. 2. Small dependent bilateral pleural effusions, new, left greater than right. 3. Nonspecific increased right paratracheal adenopathy, new subcarinal adenopathy and new bilateral hilar adenopathy. Findings are worrisome for progressive nodal metastatic disease. 4. Stable pulmonary nodules.  No new pulmonary nodules. 5. Chronic findings include: Coronary atherosclerosis. Small hiatal hernia. Aortic Atherosclerosis (ICD10-I70.0) and Emphysema (ICD10-J43.9). Electronically Signed   By: Ilona Sorrel M.D.   On: 06/05/2017 12:02   Ct Abdomen Pelvis W Contrast  Result Date: 06/03/2017 CLINICAL DATA:  Abdominal pain and diarrhea. EXAM: CT ABDOMEN AND PELVIS WITH CONTRAST TECHNIQUE: Multidetector CT imaging of the abdomen and pelvis was performed using the standard  protocol following bolus administration of intravenous contrast. CONTRAST:  100 cc of Omni 300 COMPARISON:  12/06/2016 FINDINGS: Lower chest: Mild interstitial prominence is identified within the lung bases bilaterally. No pleural effusion. Hepatobiliary: 1.2 cm low-attenuation structure and caudate lobe of liver is unchanged from 2016 and likely represents a small cyst. No suspicious liver abnormalities. The gallbladder appears tiny stones within the dependent portion of the gallbladder suspected. No gallbladder wall thickening. No biliary dilatation. Pancreas: Unremarkable. No pancreatic ductal dilatation or surrounding inflammatory changes. Spleen: Normal in size without focal abnormality. Adrenals/Urinary Tract: The adrenal glands appear within normal limits. Unremarkable appearance of both kidneys. No mass or hydronephrosis. Urinary bladder  unremarkable. Stomach/Bowel: Small hiatal hernia. The small bowel loops have a normal course and caliber. There is abnormal wall thickening and inflammation involving the entire colon up to the level of the rectum compatible with pancolitis. No pneumatosis or bowel perforation. No abscess identified. Vascular/Lymphatic: Aortic atherosclerosis. No aneurysm. Small retroperitoneal lymph nodes identified but no adenopathy. Reproductive: Previous hysterectomy.  No adnexal mass. Other: Multiple peritoneal nodules are identified and appear progressive when compared with previous exam. Adjacent to the ascending colon is a soft tissue nodule measuring 3.1 cm wall, image 55 of series 2. Previously 0.8 cm. Mass in the right side of pelvis measures, image 72 of series 2. Previously 3.9 cm. Central pelvic soft tissue lesion measures 4.1 cm, image 75 of series 2. Previously 3.7 cm. Left posterior pelvis nodule measures 2.7 cm, image 74 of series 2. Previously 2.5 cm. Musculoskeletal: Previous right total hip arthroplasty. No aggressive lytic or sclerotic bone lesions. IMPRESSION: 1. Examination is stool ball positive for pancolitis. No pneumatosis, perforation or abscess. 2. Progression of multifocal soft tissue nodularity compatible with peritoneal metastasis secondary to known ovarian cancer. 3.  Aortic Atherosclerosis (ICD10-I70.0). Electronically Signed   By: Kerby Moors M.D.   On: 06/03/2017 15:31   Nm Pulmonary Perf And Vent  Result Date: 06/04/2017 CLINICAL DATA:  Shortness of Breath EXAM: NUCLEAR MEDICINE VENTILATION - PERFUSION LUNG SCAN VIEWS: Anterior, posterior, left lateral, right lateral, RPO, LPO, RAO, LAO -ventilation and perfusion RADIOPHARMACEUTICALS:  32.4 mCi Technetium-71m DTPA aerosol inhalation and 4.3 mCi Technetium-41m MAA IV COMPARISON:  Chest radiograph June 03, 2017 FINDINGS: Ventilation: There are multiple subsegmental ventilation defects, slightly more on the right than on the left. No  segmental ventilation defects are evident. Perfusion: There are scattered subsegmental defects, largest involving a portion of the anterior segment of the left upper lobe. This defect matches the ventilation study. There are fewer perfusion defects than ventilation defects. There is no appreciable perfusion defect disproportionate to ventilation defects. IMPRESSION: Scattered subsegmental defects, more notable on the ventilation and perfusion study. Largest defect on the perfusion study is in the left upper lobe which matches the ventilation defect. There is no significant ventilation/perfusion mismatch. This study constitutes an overall low probability of pulmonary embolus based on PIOPED II guidelines. Electronically Signed   By: Lowella Grip III M.D.   On: 06/04/2017 14:23    Microbiology: Recent Results (from the past 240 hour(s))  C difficile quick scan w PCR reflex     Status: None   Collection Time: 06/03/17  2:29 PM  Result Value Ref Range Status   C Diff antigen NEGATIVE NEGATIVE Final   C Diff toxin NEGATIVE NEGATIVE Final   C Diff interpretation No C. difficile detected.  Final  Gastrointestinal Panel by PCR , Stool     Status: None   Collection Time: 06/03/17  2:29 PM  Result Value Ref Range Status   Campylobacter species NOT DETECTED NOT DETECTED Final   Plesimonas shigelloides NOT DETECTED NOT DETECTED Final   Salmonella species NOT DETECTED NOT DETECTED Final   Yersinia enterocolitica NOT DETECTED NOT DETECTED Final   Vibrio species NOT DETECTED NOT DETECTED Final   Vibrio cholerae NOT DETECTED NOT DETECTED Final   Enteroaggregative E coli (EAEC) NOT DETECTED NOT DETECTED Final   Enteropathogenic E coli (EPEC) NOT DETECTED NOT DETECTED Final   Enterotoxigenic E coli (ETEC) NOT DETECTED NOT DETECTED Final   Shiga like toxin producing E coli (STEC) NOT DETECTED NOT DETECTED Final   Shigella/Enteroinvasive E coli (EIEC) NOT DETECTED NOT DETECTED Final   Cryptosporidium NOT  DETECTED NOT DETECTED Final   Cyclospora cayetanensis NOT DETECTED NOT DETECTED Final   Entamoeba histolytica NOT DETECTED NOT DETECTED Final   Giardia lamblia NOT DETECTED NOT DETECTED Final   Adenovirus F40/41 NOT DETECTED NOT DETECTED Final   Astrovirus NOT DETECTED NOT DETECTED Final   Norovirus GI/GII NOT DETECTED NOT DETECTED Final   Rotavirus A NOT DETECTED NOT DETECTED Final   Sapovirus (I, II, IV, and V) NOT DETECTED NOT DETECTED Final     Labs: Basic Metabolic Panel: Recent Labs  Lab 06/03/17 1241 06/03/17 2311 06/04/17 0047 06/05/17 0516 06/06/17 0520  NA 132*  --  133* 132* 134*  K 3.4*  --  3.8 3.5 3.8  CL 98*  --  101 100* 104  CO2 24  --  21* 22 24  GLUCOSE 98  --  84 97 99  BUN 18  --  16 11 8   CREATININE 1.23* 1.26* 1.20* 1.05* 1.05*  CALCIUM 8.6*  --  8.2* 8.3* 8.3*  MG  --   --   --  1.7 2.3   Liver Function Tests: Recent Labs  Lab 06/03/17 1241 06/04/17 0047  AST 23 24  ALT 19 19  ALKPHOS 61 61  BILITOT 0.7 0.9  PROT 6.8 6.4*  ALBUMIN 3.4* 3.2*   Recent Labs  Lab 06/03/17 1241  LIPASE 29   No results for input(s): AMMONIA in the last 168 hours. CBC: Recent Labs  Lab 06/03/17 1241 06/03/17 2311 06/04/17 0047 06/05/17 0516 06/06/17 0520  WBC 15.1* 14.8* 14.0* 13.2* 11.5*  NEUTROABS 12.0*  --   --  9.6*  --   HGB 11.9* 10.9* 10.6* 11.3* 10.9*  HCT 34.5* 32.0* 31.5* 32.9* 32.0*  MCV 92.5 92.8 93.2 91.9 92.8  PLT 151 155 151 171 189   Cardiac Enzymes: Recent Labs  Lab 06/03/17 1241  TROPONINI <0.03   BNP: BNP (last 3 results) Recent Labs    06/03/17 2311  BNP 464.0*    ProBNP (last 3 results) No results for input(s): PROBNP in the last 8760 hours.  CBG: No results for input(s): GLUCAP in the last 168 hours.     Signed:  Irine Seal MD.  [facility] 06/06/2017, 3:34 PM

## 2017-06-06 NOTE — Progress Notes (Signed)
D/c ing home All d/c instructions and scripts given w verbal understanding.All belongings given. Awaiting ride

## 2017-06-06 NOTE — Care Management Note (Signed)
Case Management Note  Patient Details  Name: [name] MRN: [id number] Date of Birth: 1961-01-09  Subjective/Objective:  No CM needs.                  Action/Plan:d/c home.   Expected Discharge Date:                  Expected Discharge Plan:  Home/Self Care  In-House Referral:     Discharge planning Services  CM Consult  Post Acute Care Choice:    Choice offered to:     DME Arranged:    DME Agency:     HH Arranged:    [place] Agency:     Status of Service:  Completed, signed off  If discussed at [employer] of Stay Meetings, dates discussed:    Additional Comments:  Dessa Phi, RN 06/06/2017, 1:01 PM

## 2017-06-06 NOTE — Progress Notes (Signed)
Subjective: The patient was seen and examined at bedside. Reports improvement in diarrhea-decreased frequency of bowel movements(total for so far from yesterday evening) and increased consistency of stools to more mushy stools. Denies abdominal pain, nausea, vomiting or fever.   Objective: Vital signs in last 24 hours: Temp:  [97.6 F (36.4 C)-98.2 F (36.8 C)] 97.6 F (36.4 C) (12/14 0547) Pulse Rate:  [88-92] 91 (12/14 0721) Resp:  [18] 18 (12/14 0547) BP: (90-108)/(58-68) 92/60 (12/14 0901) SpO2:  [93 %-95 %] 95 % (12/14 0547) Weight:  [91.4 kg (201 lb 8 oz)] 91.4 kg (201 lb 8 oz) (12/14 0547) Weight change: -1.9 kg (-3 oz) Last BM Date: 06/06/17  PE: Not in acute distress, moist oral mucosa GENERAL: Mild pallor, no icterus ABDOMEN: Soft, non-distended, non-tender, normoactive bowel sounds EXTREMITIES: No deformity  Lab Results: Results for orders placed or performed during the hospital encounter of 06/03/17 (from the past 48 hour(s))  Basic metabolic panel     Status: Abnormal   Collection Time: 06/05/17  5:16 AM  Result Value Ref Range   Sodium 132 (L) 135 - 145 mmol/L   Potassium 3.5 3.5 - 5.1 mmol/L   Chloride 100 (L) 101 - 111 mmol/L   CO2 22 22 - 32 mmol/L   Glucose, Bld 97 65 - 99 mg/dL   BUN 11 6 - 20 mg/dL   Creatinine, Ser 1.05 (H) 0.44 - 1.00 mg/dL   Calcium 8.3 (L) 8.9 - 10.3 mg/dL   GFR calc non Af Amer 58 (L) >60 mL/min   GFR calc Af Amer >60 >60 mL/min    Comment: (NOTE) The eGFR has been calculated using the CKD EPI equation. This calculation has not been validated in all clinical situations. eGFR's persistently <60 mL/min signify possible Chronic Kidney Disease.    Anion gap 10 5 - 15  CBC with Differential/Platelet     Status: Abnormal   Collection Time: 06/05/17  5:16 AM  Result Value Ref Range   WBC 13.2 (H) 4.0 - 10.5 K/uL   RBC 3.58 (L) 3.87 - 5.11 MIL/uL   Hemoglobin 11.3 (L) 12.0 - 15.0 g/dL   HCT 32.9 (L) 36.0 - 46.0 %   MCV 91.9 78.0  - 100.0 fL   MCH 31.6 26.0 - 34.0 pg   MCHC 34.3 30.0 - 36.0 g/dL   RDW 13.7 11.5 - 15.5 %   Platelets 171 150 - 400 K/uL   Neutrophils Relative % 72 %   Neutro Abs 9.6 (H) 1.7 - 7.7 K/uL   Lymphocytes Relative 12 %   Lymphs Abs 1.6 0.7 - 4.0 K/uL   Monocytes Relative 11 %   Monocytes Absolute 1.4 (H) 0.1 - 1.0 K/uL   Eosinophils Relative 4 %   Eosinophils Absolute 0.6 0.0 - 0.7 K/uL   Basophils Relative 1 %   Basophils Absolute 0.1 0.0 - 0.1 K/uL  Magnesium     Status: None   Collection Time: 06/05/17  5:16 AM  Result Value Ref Range   Magnesium 1.7 1.7 - 2.4 mg/dL  Basic metabolic panel     Status: Abnormal   Collection Time: 06/06/17  5:20 AM  Result Value Ref Range   Sodium 134 (L) 135 - 145 mmol/L   Potassium 3.8 3.5 - 5.1 mmol/L   Chloride 104 101 - 111 mmol/L   CO2 24 22 - 32 mmol/L   Glucose, Bld 99 65 - 99 mg/dL   BUN 8 6 - 20 mg/dL   Creatinine, Ser  1.05 (H) 0.44 - 1.00 mg/dL   Calcium 8.3 (L) 8.9 - 10.3 mg/dL   GFR calc non Af Amer 58 (L) >60 mL/min   GFR calc Af Amer >60 >60 mL/min    Comment: (NOTE) The eGFR has been calculated using the CKD EPI equation. This calculation has not been validated in all clinical situations. eGFR's persistently <60 mL/min signify possible Chronic Kidney Disease.    Anion gap 6 5 - 15  Magnesium     Status: None   Collection Time: 06/06/17  5:20 AM  Result Value Ref Range   Magnesium 2.3 1.7 - 2.4 mg/dL  CBC     Status: Abnormal   Collection Time: 06/06/17  5:20 AM  Result Value Ref Range   WBC 11.5 (H) 4.0 - 10.5 K/uL   RBC 3.45 (L) 3.87 - 5.11 MIL/uL   Hemoglobin 10.9 (L) 12.0 - 15.0 g/dL   HCT 32.0 (L) 36.0 - 46.0 %   MCV 92.8 78.0 - 100.0 fL   MCH 31.6 26.0 - 34.0 pg   MCHC 34.1 30.0 - 36.0 g/dL   RDW 13.9 11.5 - 15.5 %   Platelets 189 150 - 400 K/uL    Studies/Results: Ct Chest W Contrast  Result Date: 06/05/2017 CLINICAL DATA:  Inpatient. Metastatic granulosa cell ovarian carcinoma. Dyspnea. Chest staging.  EXAM: CT CHEST WITH CONTRAST TECHNIQUE: Multidetector CT imaging of the chest was performed during intravenous contrast administration. CONTRAST:  45m ISOVUE-300 IOPAMIDOL (ISOVUE-300) INJECTION 61% COMPARISON:  12/06/2016 CT chest.  06/03/2017 chest radiograph. FINDINGS: Cardiovascular: Top-normal heart size. No significant pericardial fluid/thickening. Three lead left subclavian ICD is noted with lead tips in the right atrium and right ventricle. Mitral valve prosthesis is in place. Coronary atherosclerosis. Atherosclerotic nonaneurysmal thoracic aorta. Stable top-normal caliber main pulmonary artery (3.0 cm diameter) . No central pulmonary emboli. Mediastinum/Nodes: No discrete thyroid nodules. Unremarkable esophagus. No axillary adenopathy. Right paratracheal adenopathy up to 1.5 cm (series 2/ image 52), previously 1.2 cm, mildly increased. Newly enlarged 1.7 cm subcarinal node (series 2/ image 67). New mild bilateral hilar adenopathy measuring up to 1.1 cm on the right (series 2/image 64) and 1.1 cm on the left (series 2/image 71). Lungs/Pleura: No pneumothorax. New small dependent bilateral pleural effusions, left greater than right. Mild centrilobular emphysema with mild diffuse bronchial wall thickening. Moderate interlobular septal thickening throughout both lungs has increased since 12/06/2016 chest CT, relatively symmetrically involving the lungs. Hypoventilatory changes are noted in the dependent lungs. Subsolid 1.0 cm posterior right upper lobe (series 7/ image 41) and 0.5 cm left upper lobe (series 7/ image 42) pulmonary nodules are stable using similar measurement technique. No new significant pulmonary nodules. No lung masses. No acute consolidative airspace disease. Upper abdomen: Small hiatal hernia. Simple 1.3 cm caudate lobe liver cyst. Musculoskeletal: No aggressive appearing focal osseous lesions. Loose intra-articular osseous bodies are again noted throughout the left glenohumeral joint.  Intact sternotomy wires. Moderate thoracic spondylosis. IMPRESSION: 1. Nonspecific interlobular septal thickening throughout both lungs, increased since 12/06/2016 chest CT. In a patient with a history of ovarian cancer, this finding raises concern for lymphangitic tumor spread, however the patient also has a history of cardiac valvular disease and the findings may simply represent mild pulmonary edema. 2. Small dependent bilateral pleural effusions, new, left greater than right. 3. Nonspecific increased right paratracheal adenopathy, new subcarinal adenopathy and new bilateral hilar adenopathy. Findings are worrisome for progressive nodal metastatic disease. 4. Stable pulmonary nodules.  No new pulmonary nodules. 5. Chronic  findings include: Coronary atherosclerosis. Small hiatal hernia. Aortic Atherosclerosis (ICD10-I70.0) and Emphysema (ICD10-J43.9). Electronically Signed   By: Ilona Sorrel M.D.   On: 06/05/2017 12:02   Nm Pulmonary Perf And Vent  Result Date: 06/04/2017 CLINICAL DATA:  Shortness of Breath EXAM: NUCLEAR MEDICINE VENTILATION - PERFUSION LUNG SCAN VIEWS: Anterior, posterior, left lateral, right lateral, RPO, LPO, RAO, LAO -ventilation and perfusion RADIOPHARMACEUTICALS:  32.4 mCi Technetium-97mDTPA aerosol inhalation and 4.3 mCi Technetium-92mAA IV COMPARISON:  Chest radiograph June 03, 2017 FINDINGS: Ventilation: There are multiple subsegmental ventilation defects, slightly more on the right than on the left. No segmental ventilation defects are evident. Perfusion: There are scattered subsegmental defects, largest involving a portion of the anterior segment of the left upper lobe. This defect matches the ventilation study. There are fewer perfusion defects than ventilation defects. There is no appreciable perfusion defect disproportionate to ventilation defects. IMPRESSION: Scattered subsegmental defects, more notable on the ventilation and perfusion study. Largest defect on the  perfusion study is in the left upper lobe which matches the ventilation defect. There is no significant ventilation/perfusion mismatch. This study constitutes an overall low probability of pulmonary embolus based on PIOPED II guidelines. Electronically Signed   By: WiLowella GripII M.D.   On: 06/04/2017 14:23    Medications: I have reviewed the patient's current medications.  Assessment: Diarrhea-resolved Pancolitis on CAT scan, leukocytosis improving Normocytic anemia History of ovarian cancer, possible lymphangitic tumor spread  Plan: Okay to discharge from GI standpoint, advised to continue Cipro/ Flagyl for a total of 10 days. My office will schedule the patient directly for an outpatient colonoscopy. I have changed aspirin from 81 mg back to 325 mg. She will be given instructions to hold aspirin 325 mg 5 days before scheduled outpatient colonoscopy. Will sign off from GI standpoint    ArRonnette Juniper2/14/2018, 12:58 PM   Pager [phone] no answer or after 5 PM call [phone]

## 2017-06-06 NOTE — Progress Notes (Signed)
Given hypotension, I would hold losartan and continue carvedilol 3.125 mg po BID. The patient can hold it if systolic BP < 90 mmHg.  We will arrange for an early follow up in the outpatient clinic.  Ena Dawley, MD 06/06/2017

## 2017-06-06 NOTE — Progress Notes (Signed)
D/c to home via w/c voices no c/o.

## 2017-06-10 LAB — INHIBIN B: INHIBIN B: 426.3 pg/mL — AB (ref 0.0–16.9)

## 2017-06-10 NOTE — Progress Notes (Signed)
Cardiology Office Note    Date:  06/11/2017   ID:  [place], [name] 12/04/1960, MRN [id number]  PCP:  Default, Provider, MD  Cardiologist: Sinclair Grooms, MD   Chief Complaint  Patient presents with  . Cardiac Valve Problem  . Congestive Heart Failure  . Coronary Artery Disease    History of Present Illness:  [name] is a 56 y.o. female  with prior history of large right coronary/right ventricular/inferior wall infarction with aneurysm and papillary muscle rupture requiring mitral valve replacement with tissue prosthesis in 2011, ventricular tachycardia, AICD, ischemic cardiomyopathy, chronic systolic heart failure class 3 with most recent EF 40%, status post ICD implantation and metastatic ovarian cancer.   Recent hospitalization for pancolitis.  Developed dyspnea while in the hospital and after evaluation was found to have mitral stenosis by echo with a mean gradient of 21 mmHg across the mitral valve.  She has exertional shortness of breath.  She has relatively low blood pressures.  Diuretic therapy was discontinued while in the hospital because of relatively low blood pressures.  She denies chest pain.  She is status post bypass surgery and mitral valve repair after developing acute infarct related mitral regurgitation.  If she has to have repeat mitral valve surgery she is willing to proceed.  She has ovarian cancer with metastatic disease.  Overall prognosis not known.   Past Medical History:  Diagnosis Date  . Arthritis 02/06/2012   "everywhere"  . Asthma    as a child  . Automatic implantable cardioverter-defibrillator in situ    DR. Beckie Salts   . Bladder cancer [facility]) 2009   "injected medicine to get rid of it"  . Chronic systolic CHF (congestive heart failure) ([place])   . CKD (chronic kidney disease), stage III ([place])   . Coronary artery disease    a. large RV/inferior MI complicated by pseudoaneurysm s/p aneurysemectomy and CABG 10/2009 with papillary  muscle rupture requiring bioprosthetic mitral valve replacement in [facility].  . 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 H/O thymectomy   . High cholesterol   . History of mitral valve replacement with tissue graft 2011  . Hypothyroidism   . ICD (implantable cardiac defibrillator) in place   . Ischemic cardiomyopathy    a.  ICM and VT arrest 10/2009 s/p AICD (revision 01/2012 due to RV lead problem).  . Myocardial infarction ([place]) 2011  . Neuropathy    FEET AND HANDS - FROM CHEMO - BUT MUCH IMPROVED  . PAF (paroxysmal atrial fibrillation) ([place])   . Pain    JOINT PAINS AND MUSCLE ACHES ALL OVER.  Marland Kitchen Port-A-Cath in place    RIGHT UPPER CHEST  . SVT (supraventricular tachycardia) ([place])   . Tobacco abuse   . Ventricular tachycardia [facility])     Past Surgical History:  Procedure Laterality Date  . ABDOMINAL HYSTERECTOMY N/A 07/27/2013   Procedure: TUMOR EXCISION OF ABDOMINAL MASS;  Surgeon: Alvino Chapel, MD;  Location: [facility] ORS;  Service: Gynecology;  Laterality: N/A;  . APPENDECTOMY  1989  . BILATERAL OOPHORECTOMY  2007  . CARDIAC DEFIBRILLATOR PLACEMENT  02/06/2012   "lead change"  . CARDIAC VALVE REPLACEMENT    . CYSTOSCOPY Right 02/23/2013   Procedure: CYSTOSCOPY WITH STENT REMOVAL;  Surgeon: Alvino Chapel, MD;  Location: [facility];  Service: Gynecology;  Laterality: Right;  . IMPLANTABLE CARDIOVERTER DEFIBRILLATOR REVISION N/A 02/06/2012   Procedure: IMPLANTABLE CARDIOVERTER DEFIBRILLATOR REVISION;  Surgeon: Evans Lance, MD;  Location: [facility] CATH LAB;  Service: Cardiovascular;  Laterality: N/A;  . INSERT / REPLACE / REMOVE PACEMAKER  10/2009   DUAL-CHAMBER St. JUDE DEFIBRILLATOR  . LAPAROTOMY N/A 01/19/2013   Procedure: TUMOR DEBULKING / BOWEL RESECTION/INSERTION RIGHT UTERERAL DOUBLE J STENT/OMENTECTOMY;  Surgeon: Alvino Chapel, MD;  Location: [facility];  Service: Gynecology;  Laterality: N/A;  . MITRAL VALVE REPLACEMENT  2011   "pig  valve"  . THYMUS REMOVED    . TONSILLECTOMY AND ADENOIDECTOMY  ~ 1967  . TOTAL HIP ARTHROPLASTY  05/06/2012   Procedure: TOTAL HIP ARTHROPLASTY;  Surgeon: Gearlean Alf, MD;  Location: [facility];  Service: Orthopedics;  Laterality: Right;  . TRANSURETHRAL RESECTION OF BLADDER  2009   "for bladder cancer"  . TUBAL LIGATION  1993  . VAGINAL HYSTERECTOMY  2001    Current Medications: Outpatient Medications Prior to Visit  Medication Sig Dispense Refill  . albuterol (PROVENTIL HFA;VENTOLIN HFA) 108 (90 Base) MCG/ACT inhaler Inhale 2 puffs into the lungs every 4 (four) hours as needed for wheezing or shortness of breath. 1 Inhaler 1  . aspirin 325 MG EC tablet Take 325 mg by mouth every morning. Reported on 10/20/2015    . b complex vitamins tablet Take 1 tablet by mouth daily.    . carvedilol (COREG) 3.125 MG tablet Take 1 tablet (3.125 mg total) by mouth 2 (two) times daily with a meal. 60 tablet 1  . cholecalciferol (VITAMIN D-400) 400 UNITS TABS tablet Take 400 Units by mouth 2 (two) times daily.    . ciprofloxacin (CIPRO) 500 MG tablet Take 1 tablet (500 mg total) by mouth 2 (two) times daily for 8 days. 16 tablet 0  . diphenhydrAMINE (BENADRYL) 25 MG tablet Take 25 mg by mouth as needed for itching or allergies. ALLERGIES    . LORazepam (ATIVAN) 0.5 MG tablet TAKE 1 TABLET BY MOUTH EVERY 6 HOURS AS NEEDED FOR NAUSEA AND VOMITING 60 tablet 2  . metroNIDAZOLE (FLAGYL) 500 MG tablet Take 1 tablet (500 mg total) by mouth every 8 (eight) hours for 8 days. 24 tablet 0  . Multiple Vitamins-Minerals (MULTIVITAMIN PO) Take 1 tablet by mouth daily.     . Vitamin D, Ergocalciferol, (DRISDOL) 50000 units CAPS capsule TAKE 1 CAPSULE (50,000 UNITS TOTAL) BY MOUTH EVERY 7 (SEVEN) DAYS. 24 capsule 2  . zolpidem (AMBIEN) 5 MG tablet TAKE ONE TABLET BY MOUTH AT BEDTIME 30 tablet 2   No facility-administered medications prior to visit.      Allergies:   Prochlorperazine edisylate; Adhesive [tape]; and  Prochlorperazine   Social History   Socioeconomic History  . Marital status: Divorced    Spouse name: None  . Number of children: None  . Years of education: None  . Highest education level: None  Social Needs  . Financial resource strain: None  . Food insecurity - worry: None  . Food insecurity - inability: None  . Transportation needs - medical: None  . Transportation needs - non-medical: None  Occupational History    Comment: WORKS FULL TIME  Tobacco Use  . Smoking status: Current Some Day Smoker    Packs/day: 0.50    Years: 30.00    Pack years: 15.00    Types: Cigarettes    Start date: 03/28/1974  . Smokeless tobacco: Never Used  Substance and Sexual Activity  . Alcohol use: No    Alcohol/week: 0.0 oz  . Drug use: No  . Sexual activity: None  Other Topics Concern  . None  Social History Narrative   WORKS FULL TIME   SINGLE   TOBACCO USE-YES   IMPLANTATION OF DUAL-CHAMBER St. JUDE DEFIBRILLATOR    Family History:  The patient's  family history includes Heart attack in her brother, maternal grandfather, and paternal grandfather; Stroke in her maternal grandmother.   ROS:   Please see the history of present illness.    The major complaint is shortness of breath.  She has abdominal discomfort.  Still awaiting clarification of treatment plan for ovarian cancer. All other systems reviewed and are negative.   PHYSICAL EXAM:   VS:  BP (!) 94/58   Pulse 94   Ht 5\' 6"  (1.676 m)   Wt 203 lb (92.1 kg)   BMI 32.77 kg/m    GEN: Well nourished, well developed, in no acute distress  HEENT: normal  Neck: Moderate JVD at 45 degrees.  No carotid bruits, or masses. Cardiac: RRR; no murmurs, rubs, or gallops,no edema  Respiratory:  clear to auscultation bilaterally, normal work of breathing GI: soft, nontender, nondistended, + BS MS: no deformity or atrophy  Skin: warm and dry, no rash Neuro:  Alert and Oriented x 3, Strength and sensation are intact Psych: euthymic  mood, full affect  Wt Readings from Last 3 Encounters:  06/11/17 203 lb (92.1 kg)  06/06/17 201 lb 8 oz (91.4 kg)  09/20/16 189 lb 8 oz (86 kg)      Studies/Labs Reviewed:   EKG:  EKG  Normal sinus rhythm, left atrial abnormality, inferior Q waves, interventricular conduction delay.  Recent Labs: 06/03/2017: B Natriuretic Peptide 464.0 06/04/2017: ALT 19 06/06/2017: BUN 8; Creatinine, Ser 1.05; Hemoglobin 10.9; Magnesium 2.3; Platelets 189; Potassium 3.8; Sodium 134   Lipid Panel No results found for: CHOL, TRIG, HDL, CHOLHDL, VLDL, LDLCALC, LDLDIRECT  Additional studies/ records that were reviewed today include:   2D Doppler echocardiogram 06/04/17: Study Conclusions   - Left ventricle: The cavity size was normal. Wall thickness was   normal. Systolic function was moderately reduced. The estimated   ejection fraction was in the range of 35% to 40%. Global   hypokinesis with basal to mid inferior akinesis. The study is not   technically sufficient to allow evaluation of LV diastolic   function. - Aortic valve: Sclerosis without stenosis. There was no   regurgitation. - Mitral valve: Bioprosthetic valve. Leaflets appear calcified with   minimal excursion. There is an increased mean transmitral   gradient of 21 mmHg and pressure 1/2t of 200 msec, suggestive of   severe mitral stenosis. Calculated AVA of 1.1 cm2 at a HR of 95.   There was mild regurgitation. Valve area by pressure half-time:   1.11 cm^2. - Left atrium: Moderately dilated. - Right ventricle: The cavity size was mildly dilated. Mildly   reduced RV systolic function. AICD wire noted in right ventricle. - Right atrium: The atrium was normal in size. AICD wire noted in   right atrium. - Tricuspid valve: There was moderate regurgitation. - Pulmonary arteries: PA peak pressure: 65 mm Hg (S). - Inferior vena cava: The vessel was dilated. The respirophasic   diameter changes were blunted (< 50%), consistent with  elevated   central venous pressure.   Impressions:   - Compared to a prior study in 2016, the LVEF is lower at 35-40%   with global hypokinesis and inferior akinesis. AICD wires are   present. There is a degenerate bioprosthetic mitral valve with   severe  stenosis and mild regurgitation. The leafelts are   calcified and poorly mobile. Mean gradient of 21 mmHg (up from 16   mmHg) with calculated AVA of around 1.1 cm2 at HR of 95 bpm,   moderate LAE and moderate pulmonary hypertension with RVSP of 65   mmHg is noted.      ASSESSMENT:    1. Chronic systolic HF (heart failure) ([place])   2. S/P MVR (mitral valve replacement)   3. Athscl autologous vein CABG w oth angina pectoris (HCC)   4. Paroxysmal atrial fibrillation ([place])   5. Simple chronic bronchitis ([place])   6. Automatic implantable cardioverter-defibrillator in situ   7. CKD (chronic kidney disease) stage 3, GFR 30-59 ml/min (HCC)      PLAN:  In order of problems listed above:  1. Has chronic combined systolic and diastolic heart failure with exertional dyspnea.  Based upon the most recent echocardiogram there is a significant transmitral gradient compatible with mitral stenosis due to degeneration of the bioprosthetic valve.  She will undergo left and right heart catheterization with coronary angiography.  Hopefully this can all be done from the right arm.  She was then be referred, depending upon findings, for consideration of mitral valve therapy.  It would be nice if she has a percutaneous option.  This is not usually the case with degenerated mitral position bioprosthetic valves. 2. Have recommended she undergo right and left heart catheterization with coronary angiography, bypass graft angiography, ventriculography to fully assess severity of mitral valve disease.  Have added low-dose Aldactone 25 mg/day.  Lab work in 7-10 days.  Risk of heart catheterization including stroke, death, bleeding, myocardial infarction, kidney  injury, among others were discussed in detail with the patient and accepted. 3. No current symptoms to suggest bypass graft failure. 4. Uncertain prognosis.  This will guide the aggressiveness of therapy related to the mitral valve operation.  We will need to speak with both Dr. Fermin Schwab and Dr. Marin Olp before we advance much further. 5. No recent atrial fibrillation.  This occurred predominantly postop peri-infarct timeframe 2011.  Tentative plan now is to perform right and left heart cath with coronary angiography.  Will need to speak with Dr. Marin Olp and Dr. Fermin Schwab to determine prognosis related to ovarian cancer.  Medication Adjustments/Labs and Tests Ordered: Current medicines are reviewed at length with the patient today.  Concerns regarding medicines are outlined above.  Medication changes, Labs and Tests ordered today are listed in the Patient Instructions below. Patient Instructions  Medication Instructions:  1) START Spironolactone 25mg  once daily  Labwork: Your physician recommends that you return for lab work in: 2 weeks (BMET, CBC and INR)   Testing/Procedures: Your physician has requested that you have a cardiac catheterization. Cardiac catheterization is used to diagnose and/or treat various heart conditions. Doctors may recommend this procedure for a number of different reasons. The most common reason is to evaluate chest pain. Chest pain can be a symptom of coronary artery disease (CAD), and cardiac catheterization can show whether plaque is narrowing or blocking your heart's arteries. This procedure is also used to evaluate the valves, as well as measure the blood flow and oxygen levels in different parts of your heart. For further information please visit [email or web address]. Please follow instruction sheet, as given.   Follow-Up: Your physician recommends that you schedule a follow-up appointment in: 1-3 weeks after cath (Can have 1/28 at end of  clinic)   Any Other Special Instructions Will Be Listed Below (If Applicable).  If you need a refill on your cardiac medications before your next appointment, please call your pharmacy.    [facility] [address], [place] [address] [place] [address] Dept: [phone] Loc: [place]  06/11/2017  You are scheduled for a Cardiac Catheterization on Wednesday, January 9 with Dr. Daneen Schick.  1. Please arrive at the [facility] (Main Entrance A) at [facility]: [address] [place], [place] [address] at 6:30 AM (two hours before your procedure to ensure your preparation). Free valet parking service is available.   Special note: Every effort is made to have your procedure done on time. Please understand that emergencies sometimes delay scheduled procedures.  2. Diet: Do not eat or drink anything after midnight prior to your procedure except sips of water to take medications.  3. Labs: You will need to have labs drawn prior to your cath.  4. Medication instructions in preparation for your procedure: Hold your Spironolactone the morning of your procedure.   On the morning of your procedure, take your Aspirin and any morning medicines NOT listed above.  You may use sips of water.  5. Plan for one night stay--bring personal belongings. 6. Bring a current list of your medications and current insurance cards. 7. You MUST have a responsible person to drive you home. 8. Someone MUST be with you the first 24 hours after you arrive home or your discharge will be delayed. 9. Please wear clothes that are easy to get on and off and wear slip-on shoes.  Thank you for allowing [place] to care for you!   -- [facility]     Signed, Sinclair Grooms, MD  06/11/2017 11:32 AM    [address], [place], [place]   [address] Phone: [phone]; Fax: [phone]

## 2017-06-10 NOTE — H&P (View-Only) (Signed)
Cardiology Office Note    Date:  06/11/2017   ID:  [name], [name] October 04, 1960, MRN [id number]  PCP:  Default, Provider, MD  Cardiologist: Sinclair Grooms, MD   Chief Complaint  Patient presents with  . Cardiac Valve Problem  . Congestive Heart Failure  . Coronary Artery Disease    History of Present Illness:  [name] is a 56 y.o. female  with prior history of large right coronary/right ventricular/inferior wall infarction with aneurysm and papillary muscle rupture requiring mitral valve replacement with tissue prosthesis in 2011, ventricular tachycardia, AICD, ischemic cardiomyopathy, chronic systolic heart failure class 3 with most recent EF 40%, status post ICD implantation and metastatic ovarian cancer.   Recent hospitalization for pancolitis.  Developed dyspnea while in the hospital and after evaluation was found to have mitral stenosis by echo with a mean gradient of 21 mmHg across the mitral valve.  She has exertional shortness of breath.  She has relatively low blood pressures.  Diuretic therapy was discontinued while in the hospital because of relatively low blood pressures.  She denies chest pain.  She is status post bypass surgery and mitral valve repair after developing acute infarct related mitral regurgitation.  If she has to have repeat mitral valve surgery she is willing to proceed.  She has ovarian cancer with metastatic disease.  Overall prognosis not known.   Past Medical History:  Diagnosis Date  . Arthritis 02/06/2012   "everywhere"  . Asthma    as a child  . Automatic implantable cardioverter-defibrillator in situ    DR. Beckie Salts   . Bladder cancer [facility]) 2009   "injected medicine to get rid of it"  . Chronic systolic CHF (congestive heart failure) ([place])   . CKD (chronic kidney disease), stage III ([place])   . Coronary artery disease    a. large RV/inferior MI complicated by pseudoaneurysm s/p aneurysemectomy and CABG 10/2009 with papillary  muscle rupture requiring bioprosthetic mitral valve replacement in [facility].  . 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 H/O thymectomy   . High cholesterol   . History of mitral valve replacement with tissue graft 2011  . Hypothyroidism   . ICD (implantable cardiac defibrillator) in place   . Ischemic cardiomyopathy    a.  ICM and VT arrest 10/2009 s/p AICD (revision 01/2012 due to RV lead problem).  . Myocardial infarction ([place]) 2011  . Neuropathy    FEET AND HANDS - FROM CHEMO - BUT MUCH IMPROVED  . PAF (paroxysmal atrial fibrillation) ([place])   . Pain    JOINT PAINS AND MUSCLE ACHES ALL OVER.  Marland Kitchen Port-A-Cath in place    RIGHT UPPER CHEST  . SVT (supraventricular tachycardia) ([place])   . Tobacco abuse   . Ventricular tachycardia [facility])     Past Surgical History:  Procedure Laterality Date  . ABDOMINAL HYSTERECTOMY N/A 07/27/2013   Procedure: TUMOR EXCISION OF ABDOMINAL MASS;  Surgeon: Alvino Chapel, MD;  Location: [facility]S;  Service: Gynecology;  Laterality: N/A;  . APPENDECTOMY  1989  . BILATERAL OOPHORECTOMY  2007  . CARDIAC DEFIBRILLATOR PLACEMENT  02/06/2012   "lead change"  . CARDIAC VALVE REPLACEMENT    . CYSTOSCOPY Right 02/23/2013   Procedure: CYSTOSCOPY WITH STENT REMOVAL;  Surgeon: Alvino Chapel, MD;  Location: [facility];  Service: Gynecology;  Laterality: Right;  . IMPLANTABLE CARDIOVERTER DEFIBRILLATOR REVISION N/A 02/06/2012   Procedure: IMPLANTABLE CARDIOVERTER DEFIBRILLATOR REVISION;  Surgeon: Evans Lance, MD;  Location: [facility] CATH LAB;  Service: Cardiovascular;  Laterality: N/A;  . INSERT / REPLACE / REMOVE PACEMAKER  10/2009   DUAL-CHAMBER St. JUDE DEFIBRILLATOR  . LAPAROTOMY N/A 01/19/2013   Procedure: TUMOR DEBULKING / BOWEL RESECTION/INSERTION RIGHT UTERERAL DOUBLE J STENT/OMENTECTOMY;  Surgeon: Alvino Chapel, MD;  Location: [facility];  Service: Gynecology;  Laterality: N/A;  . MITRAL VALVE REPLACEMENT  2011   "pig  valve"  . THYMUS REMOVED    . TONSILLECTOMY AND ADENOIDECTOMY  ~ 1967  . TOTAL HIP ARTHROPLASTY  05/06/2012   Procedure: TOTAL HIP ARTHROPLASTY;  Surgeon: Gearlean Alf, MD;  Location: [facility];  Service: Orthopedics;  Laterality: Right;  . TRANSURETHRAL RESECTION OF BLADDER  2009   "for bladder cancer"  . TUBAL LIGATION  1993  . VAGINAL HYSTERECTOMY  2001    Current Medications: Outpatient Medications Prior to Visit  Medication Sig Dispense Refill  . albuterol (PROVENTIL HFA;VENTOLIN HFA) 108 (90 Base) MCG/ACT inhaler Inhale 2 puffs into the lungs every 4 (four) hours as needed for wheezing or shortness of breath. 1 Inhaler 1  . aspirin 325 MG EC tablet Take 325 mg by mouth every morning. Reported on 10/20/2015    . b complex vitamins tablet Take 1 tablet by mouth daily.    . carvedilol (COREG) 3.125 MG tablet Take 1 tablet (3.125 mg total) by mouth 2 (two) times daily with a meal. 60 tablet 1  . cholecalciferol (VITAMIN D-400) 400 UNITS TABS tablet Take 400 Units by mouth 2 (two) times daily.    . ciprofloxacin (CIPRO) 500 MG tablet Take 1 tablet (500 mg total) by mouth 2 (two) times daily for 8 days. 16 tablet 0  . diphenhydrAMINE (BENADRYL) 25 MG tablet Take 25 mg by mouth as needed for itching or allergies. ALLERGIES    . LORazepam (ATIVAN) 0.5 MG tablet TAKE 1 TABLET BY MOUTH EVERY 6 HOURS AS NEEDED FOR NAUSEA AND VOMITING 60 tablet 2  . metroNIDAZOLE (FLAGYL) 500 MG tablet Take 1 tablet (500 mg total) by mouth every 8 (eight) hours for 8 days. 24 tablet 0  . Multiple Vitamins-Minerals (MULTIVITAMIN PO) Take 1 tablet by mouth daily.     . Vitamin D, Ergocalciferol, (DRISDOL) 50000 units CAPS capsule TAKE 1 CAPSULE (50,000 UNITS TOTAL) BY MOUTH EVERY 7 (SEVEN) DAYS. 24 capsule 2  . zolpidem (AMBIEN) 5 MG tablet TAKE ONE TABLET BY MOUTH AT BEDTIME 30 tablet 2   No facility-administered medications prior to visit.      Allergies:   Prochlorperazine edisylate; Adhesive [tape]; and  Prochlorperazine   Social History   Socioeconomic History  . Marital status: Divorced    Spouse name: None  . Number of children: None  . Years of education: None  . Highest education level: None  Social Needs  . Financial resource strain: None  . Food insecurity - worry: None  . Food insecurity - inability: None  . Transportation needs - medical: None  . Transportation needs - non-medical: None  Occupational History    Comment: WORKS FULL TIME  Tobacco Use  . Smoking status: Current Some Day Smoker    Packs/day: 0.50    Years: 30.00    Pack years: 15.00    Types: Cigarettes    Start date: 03/28/1974  . Smokeless tobacco: Never Used  Substance and Sexual Activity  . Alcohol use: No    Alcohol/week: 0.0 oz  . Drug use: No  . Sexual activity: None  Other Topics Concern  . None  Social History Narrative   WORKS FULL TIME   SINGLE   TOBACCO USE-YES   IMPLANTATION OF DUAL-CHAMBER St. JUDE DEFIBRILLATOR    Family History:  The patient's  family history includes Heart attack in her brother, maternal grandfather, and paternal grandfather; Stroke in her maternal grandmother.   ROS:   Please see the history of present illness.    The major complaint is shortness of breath.  She has abdominal discomfort.  Still awaiting clarification of treatment plan for ovarian cancer. All other systems reviewed and are negative.   PHYSICAL EXAM:   VS:  BP (!) 94/58   Pulse 94   Ht 5\' 6"  (1.676 m)   Wt 203 lb (92.1 kg)   BMI 32.77 kg/m    GEN: Well nourished, well developed, in no acute distress  HEENT: normal  Neck: Moderate JVD at 45 degrees.  No carotid bruits, or masses. Cardiac: RRR; no murmurs, rubs, or gallops,no edema  Respiratory:  clear to auscultation bilaterally, normal work of breathing GI: soft, nontender, nondistended, + BS MS: no deformity or atrophy  Skin: warm and dry, no rash Neuro:  Alert and Oriented x 3, Strength and sensation are intact Psych: euthymic  mood, full affect  Wt Readings from Last 3 Encounters:  06/11/17 203 lb (92.1 kg)  06/06/17 201 lb 8 oz (91.4 kg)  09/20/16 189 lb 8 oz (86 kg)      Studies/Labs Reviewed:   EKG:  EKG  Normal sinus rhythm, left atrial abnormality, inferior Q waves, interventricular conduction delay.  Recent Labs: 06/03/2017: B Natriuretic Peptide 464.0 06/04/2017: ALT 19 06/06/2017: BUN 8; Creatinine, Ser 1.05; Hemoglobin 10.9; Magnesium 2.3; Platelets 189; Potassium 3.8; Sodium 134   Lipid Panel No results found for: CHOL, TRIG, HDL, CHOLHDL, VLDL, LDLCALC, LDLDIRECT  Additional studies/ records that were reviewed today include:   2D Doppler echocardiogram 06/04/17: Study Conclusions   - Left ventricle: The cavity size was normal. Wall thickness was   normal. Systolic function was moderately reduced. The estimated   ejection fraction was in the range of 35% to 40%. Global   hypokinesis with basal to mid inferior akinesis. The study is not   technically sufficient to allow evaluation of LV diastolic   function. - Aortic valve: Sclerosis without stenosis. There was no   regurgitation. - Mitral valve: Bioprosthetic valve. Leaflets appear calcified with   minimal excursion. There is an increased mean transmitral   gradient of 21 mmHg and pressure 1/2t of 200 msec, suggestive of   severe mitral stenosis. Calculated AVA of 1.1 cm2 at a HR of 95.   There was mild regurgitation. Valve area by pressure half-time:   1.11 cm^2. - Left atrium: Moderately dilated. - Right ventricle: The cavity size was mildly dilated. Mildly   reduced RV systolic function. AICD wire noted in right ventricle. - Right atrium: The atrium was normal in size. AICD wire noted in   right atrium. - Tricuspid valve: There was moderate regurgitation. - Pulmonary arteries: PA peak pressure: 65 mm Hg (S). - Inferior vena cava: The vessel was dilated. The respirophasic   diameter changes were blunted (< 50%), consistent with  elevated   central venous pressure.   Impressions:   - Compared to a prior study in 2016, the LVEF is lower at 35-40%   with global hypokinesis and inferior akinesis. AICD wires are   present. There is a degenerate bioprosthetic mitral valve with   severe  stenosis and mild regurgitation. The leafelts are   calcified and poorly mobile. Mean gradient of 21 mmHg (up from 16   mmHg) with calculated AVA of around 1.1 cm2 at HR of 95 bpm,   moderate LAE and moderate pulmonary hypertension with RVSP of 65   mmHg is noted.      ASSESSMENT:    1. Chronic systolic HF (heart failure) ([place])   2. S/P MVR (mitral valve replacement)   3. Athscl autologous vein CABG w oth angina pectoris (HCC)   4. Paroxysmal atrial fibrillation ([place])   5. Simple chronic bronchitis ([place])   6. Automatic implantable cardioverter-defibrillator in situ   7. CKD (chronic kidney disease) stage 3, GFR 30-59 ml/min (HCC)      PLAN:  In order of problems listed above:  1. Has chronic combined systolic and diastolic heart failure with exertional dyspnea.  Based upon the most recent echocardiogram there is a significant transmitral gradient compatible with mitral stenosis due to degeneration of the bioprosthetic valve.  She will undergo left and right heart catheterization with coronary angiography.  Hopefully this can all be done from the right arm.  She was then be referred, depending upon findings, for consideration of mitral valve therapy.  It would be nice if she has a percutaneous option.  This is not usually the case with degenerated mitral position bioprosthetic valves. 2. Have recommended she undergo right and left heart catheterization with coronary angiography, bypass graft angiography, ventriculography to fully assess severity of mitral valve disease.  Have added low-dose Aldactone 25 mg/day.  Lab work in 7-10 days.  Risk of heart catheterization including stroke, death, bleeding, myocardial infarction, kidney  injury, among others were discussed in detail with the patient and accepted. 3. No current symptoms to suggest bypass graft failure. 4. Uncertain prognosis.  This will guide the aggressiveness of therapy related to the mitral valve operation.  We will need to speak with both Dr. Fermin Schwab and Dr. Marin Olp before we advance much further. 5. No recent atrial fibrillation.  This occurred predominantly postop peri-infarct timeframe 2011.  Tentative plan now is to perform right and left heart cath with coronary angiography.  Will need to speak with Dr. Marin Olp and Dr. Fermin Schwab to determine prognosis related to ovarian cancer.  Medication Adjustments/Labs and Tests Ordered: Current medicines are reviewed at length with the patient today.  Concerns regarding medicines are outlined above.  Medication changes, Labs and Tests ordered today are listed in the Patient Instructions below. Patient Instructions  Medication Instructions:  1) START Spironolactone 25mg  once daily  Labwork: Your physician recommends that you return for lab work in: 2 weeks (BMET, CBC and INR)   Testing/Procedures: Your physician has requested that you have a cardiac catheterization. Cardiac catheterization is used to diagnose and/or treat various heart conditions. Doctors may recommend this procedure for a number of different reasons. The most common reason is to evaluate chest pain. Chest pain can be a symptom of coronary artery disease (CAD), and cardiac catheterization can show whether plaque is narrowing or blocking your heart's arteries. This procedure is also used to evaluate the valves, as well as measure the blood flow and oxygen levels in different parts of your heart. For further information please visit [facility]uge[facility]iesta[facility][email or web address]. Please follow instruction sheet, as given.   Follow-Up: Your physician recommends that you schedule a follow-up appointment in: 1-3 weeks after cath (Can have 1/28 at end of  clinic)   Any Other Special Instructions Will Be Listed Below (If Applicable).  If you need a refill on your cardiac medications before your next appointment, please call your pharmacy.    [place] OFFICE [address] [place] [address] [place] [address] Dept: [phone] Loc: [place]  06/11/2017  You are scheduled for a Cardiac Catheterization on Wednesday, January 9 with Dr. Daneen Schick.  1. Please arrive at the [facility] (Main Entrance A) at [facility]: [address] [place], [place] [address] at 6:30 AM (two hours before your procedure to ensure your preparation). Free valet parking service is available.   Special note: Every effort is made to have your procedure done on time. Please understand that emergencies sometimes delay scheduled procedures.  2. Diet: Do not eat or drink anything after midnight prior to your procedure except sips of water to take medications.  3. Labs: You will need to have labs drawn prior to your cath.  4. Medication instructions in preparation for your procedure: Hold your Spironolactone the morning of your procedure.   On the morning of your procedure, take your Aspirin and any morning medicines NOT listed above.  You may use sips of water.  5. Plan for one night stay--bring personal belongings. 6. Bring a current list of your medications and current insurance cards. 7. You MUST have a responsible person to drive you home. 8. Someone MUST be with you the first 24 hours after you arrive home or your discharge will be delayed. 9. Please wear clothes that are easy to get on and off and wear slip-on shoes.  Thank you for allowing [place] to care for you!   -- [place] [facility]     Signed, Sinclair Grooms, MD  06/11/2017 11:32 AM    [place], [place],    [address] Phone: [phone]; Fax: [phone]

## 2017-06-11 ENCOUNTER — Encounter: Payer: Self-pay | Admitting: Interventional Cardiology

## 2017-06-11 ENCOUNTER — Ambulatory Visit (INDEPENDENT_AMBULATORY_CARE_PROVIDER_SITE_OTHER): Payer: BLUE CROSS/BLUE SHIELD | Admitting: Interventional Cardiology

## 2017-06-11 VITALS — BP 94/58 | HR 94 | Ht 66.0 in | Wt 203.0 lb

## 2017-06-11 DIAGNOSIS — J410 Simple chronic bronchitis: Secondary | ICD-10-CM

## 2017-06-11 DIAGNOSIS — I5022 Chronic systolic (congestive) heart failure: Secondary | ICD-10-CM | POA: Diagnosis not present

## 2017-06-11 DIAGNOSIS — C569 Malignant neoplasm of unspecified ovary: Secondary | ICD-10-CM

## 2017-06-11 DIAGNOSIS — I25718 Atherosclerosis of autologous vein coronary artery bypass graft(s) with other forms of angina pectoris: Secondary | ICD-10-CM

## 2017-06-11 DIAGNOSIS — Z952 Presence of prosthetic heart valve: Secondary | ICD-10-CM

## 2017-06-11 DIAGNOSIS — Z95810 Presence of automatic (implantable) cardiac defibrillator: Secondary | ICD-10-CM | POA: Diagnosis not present

## 2017-06-11 DIAGNOSIS — N183 Chronic kidney disease, stage 3 unspecified: Secondary | ICD-10-CM

## 2017-06-11 DIAGNOSIS — I480 Paroxysmal atrial fibrillation: Secondary | ICD-10-CM | POA: Diagnosis not present

## 2017-06-11 MED ORDER — SPIRONOLACTONE 25 MG PO TABS: 25.0000 mg | ORAL_TABLET | Freq: Every day | ORAL | 3 refills | Status: DC

## 2017-06-11 NOTE — Patient Instructions (Addendum)
Medication Instructions:  1) START Spironolactone 25mg  once daily  Labwork: Your physician recommends that you return for lab work in: 2 weeks (BMET, CBC and INR)   Testing/Procedures: Your physician has requested that you have a cardiac catheterization. Cardiac catheterization is used to diagnose and/or treat various heart conditions. Doctors may recommend this procedure for a number of different reasons. The most common reason is to evaluate chest pain. Chest pain can be a symptom of coronary artery disease (CAD), and cardiac catheterization can show whether plaque is narrowing or blocking your heart's arteries. This procedure is also used to evaluate the valves, as well as measure the blood flow and oxygen levels in different parts of your heart. For further information please visit [facility]uge[facility]iesta[facility][email or web address]. Please follow instruction sheet, as given.   Follow-Up: Your physician recommends that you schedule a follow-up appointment in: 1-3 weeks after cath (Can have 1/28 at end of clinic)   Any Other Special Instructions Will Be Listed Below (If Applicable).     If you need a refill on your cardiac medications before your next appointment, please call your pharmacy.    [place] OFFICE [address], [place] [address] [place] [address] Dept: [phone] Loc: [place]  06/11/2017  You are scheduled for a Cardiac Catheterization on Wednesday, January 9 with Dr. Daneen Schick.  1. Please arrive at the [facility] (Main Entrance A) at [facility]: [address] [place], [place] [address] at 6:30 AM (two hours before your procedure to ensure your preparation). Free valet parking service is available.   Special note: Every effort is made to have your procedure done on time. Please understand that emergencies sometimes delay scheduled procedures.  2. Diet: Do not eat or drink anything  after midnight prior to your procedure except sips of water to take medications.  3. Labs: You will need to have labs drawn prior to your cath.  4. Medication instructions in preparation for your procedure: Hold your Spironolactone the morning of your procedure.   On the morning of your procedure, take your Aspirin and any morning medicines NOT listed above.  You may use sips of water.  5. Plan for one night stay--bring personal belongings. 6. Bring a current list of your medications and current insurance cards. 7. You MUST have a responsible person to drive you home. 8. Someone MUST be with you the first 24 hours after you arrive home or your discharge will be delayed. 9. Please wear clothes that are easy to get on and off and wear slip-on shoes.  Thank you for allowing [place] to care for you!   -- [place] [facility]

## 2017-06-26 ENCOUNTER — Other Ambulatory Visit: Payer: BLUE CROSS/BLUE SHIELD | Admitting: *Deleted

## 2017-06-26 DIAGNOSIS — Z952 Presence of prosthetic heart valve: Secondary | ICD-10-CM

## 2017-06-26 DIAGNOSIS — I5022 Chronic systolic (congestive) heart failure: Secondary | ICD-10-CM

## 2017-06-27 LAB — CBC
HEMATOCRIT: 36.3 % (ref 34.0–46.6)
Hemoglobin: 12.4 g/dL (ref 11.1–15.9)
MCH: 31.9 pg (ref 26.6–33.0)
MCHC: 34.2 g/dL (ref 31.5–35.7)
MCV: 93 fL (ref 79–97)
PLATELETS: 200 10*3/uL (ref 150–379)
RBC: 3.89 x10E6/uL (ref 3.77–5.28)
RDW: 14.7 % (ref 12.3–15.4)
WBC: 11.2 10*3/uL — ABNORMAL HIGH (ref 3.4–10.8)

## 2017-06-27 LAB — BASIC METABOLIC PANEL
BUN / CREAT RATIO: 10 (ref 9–23)
BUN: 13 mg/dL (ref 6–24)
CALCIUM: 9.6 mg/dL (ref 8.7–10.2)
CHLORIDE: 102 mmol/L (ref 96–106)
CO2: 22 mmol/L (ref 20–29)
Creatinine, Ser: 1.24 mg/dL — ABNORMAL HIGH (ref 0.57–1.00)
GFR, EST AFRICAN AMERICAN: 56 mL/min/{1.73_m2} — AB (ref >59)
GFR, EST NON AFRICAN AMERICAN: 48 mL/min/{1.73_m2} — AB (ref >59)
Glucose: 84 mg/dL (ref 65–99)
POTASSIUM: 4.1 mmol/L (ref 3.5–5.2)
SODIUM: 142 mmol/L (ref 134–144)

## 2017-06-27 LAB — PROTIME-INR
INR: 1 (ref 0.8–1.2)
PROTHROMBIN TIME: 10.6 s (ref 9.1–12.0)

## 2017-07-01 ENCOUNTER — Telehealth: Payer: Self-pay

## 2017-07-01 NOTE — Telephone Encounter (Signed)
Patient contacted pre-catheterization at [facility] scheduled for:  07/02/2017 @ 0830 Verified arrival time and place:  [facility] @ 0630 Confirmed AM meds to be taken pre-cath with sip of water: Take ASA Hold spironolactone Confirmed patient has responsible person to drive home post procedure and observe patient for 24 hours:  yes Addl concerns:  none

## 2017-07-02 ENCOUNTER — Ambulatory Visit (HOSPITAL_COMMUNITY)
Admission: RE | Admit: 2017-07-02 | Discharge: 2017-07-02 | Disposition: A | Discharge status: Home / Self Care | Payer: BLUE CROSS/BLUE SHIELD | Source: Ambulatory Visit | Attending: Interventional Cardiology | Admitting: Interventional Cardiology

## 2017-07-02 ENCOUNTER — Encounter (HOSPITAL_COMMUNITY)
Admission: RE | Disposition: A | Discharge status: Home / Self Care | Payer: Self-pay | Source: Ambulatory Visit | Attending: Interventional Cardiology

## 2017-07-02 ENCOUNTER — Encounter (HOSPITAL_COMMUNITY): Payer: Self-pay | Admitting: Interventional Cardiology

## 2017-07-02 DIAGNOSIS — T451X5A Adverse effect of antineoplastic and immunosuppressive drugs, initial encounter: Secondary | ICD-10-CM | POA: Diagnosis not present

## 2017-07-02 DIAGNOSIS — Z7982 Long term (current) use of aspirin: Secondary | ICD-10-CM | POA: Diagnosis not present

## 2017-07-02 DIAGNOSIS — I272 Pulmonary hypertension, unspecified: Secondary | ICD-10-CM | POA: Insufficient documentation

## 2017-07-02 DIAGNOSIS — C799 Secondary malignant neoplasm of unspecified site: Secondary | ICD-10-CM | POA: Insufficient documentation

## 2017-07-02 DIAGNOSIS — Z95810 Presence of automatic (implantable) cardiac defibrillator: Secondary | ICD-10-CM | POA: Insufficient documentation

## 2017-07-02 DIAGNOSIS — I255 Ischemic cardiomyopathy: Secondary | ICD-10-CM | POA: Diagnosis not present

## 2017-07-02 DIAGNOSIS — I5042 Chronic combined systolic (congestive) and diastolic (congestive) heart failure: Secondary | ICD-10-CM | POA: Diagnosis not present

## 2017-07-02 DIAGNOSIS — Z952 Presence of prosthetic heart valve: Secondary | ICD-10-CM | POA: Insufficient documentation

## 2017-07-02 DIAGNOSIS — F17210 Nicotine dependence, cigarettes, uncomplicated: Secondary | ICD-10-CM | POA: Insufficient documentation

## 2017-07-02 DIAGNOSIS — Z8551 Personal history of malignant neoplasm of bladder: Secondary | ICD-10-CM | POA: Insufficient documentation

## 2017-07-02 DIAGNOSIS — G620 Drug-induced polyneuropathy: Secondary | ICD-10-CM | POA: Insufficient documentation

## 2017-07-02 DIAGNOSIS — C569 Malignant neoplasm of unspecified ovary: Secondary | ICD-10-CM | POA: Diagnosis not present

## 2017-07-02 DIAGNOSIS — Z96641 Presence of right artificial hip joint: Secondary | ICD-10-CM | POA: Diagnosis not present

## 2017-07-02 DIAGNOSIS — Y831 Surgical operation with implant of artificial internal device as the cause of abnormal reaction of the patient, or of later complication, without mention of misadventure at the time of the procedure: Secondary | ICD-10-CM | POA: Diagnosis not present

## 2017-07-02 DIAGNOSIS — I25718 Atherosclerosis of autologous vein coronary artery bypass graft(s) with other forms of angina pectoris: Secondary | ICD-10-CM | POA: Diagnosis present

## 2017-07-02 DIAGNOSIS — Z79899 Other long term (current) drug therapy: Secondary | ICD-10-CM | POA: Diagnosis not present

## 2017-07-02 DIAGNOSIS — N183 Chronic kidney disease, stage 3 unspecified: Secondary | ICD-10-CM | POA: Diagnosis present

## 2017-07-02 DIAGNOSIS — I2510 Atherosclerotic heart disease of native coronary artery without angina pectoris: Secondary | ICD-10-CM | POA: Insufficient documentation

## 2017-07-02 DIAGNOSIS — J410 Simple chronic bronchitis: Secondary | ICD-10-CM | POA: Diagnosis not present

## 2017-07-02 DIAGNOSIS — T82857A Stenosis of cardiac prosthetic devices, implants and grafts, initial encounter: Secondary | ICD-10-CM | POA: Insufficient documentation

## 2017-07-02 DIAGNOSIS — I480 Paroxysmal atrial fibrillation: Secondary | ICD-10-CM | POA: Insufficient documentation

## 2017-07-02 DIAGNOSIS — I5022 Chronic systolic (congestive) heart failure: Secondary | ICD-10-CM | POA: Diagnosis present

## 2017-07-02 DIAGNOSIS — I252 Old myocardial infarction: Secondary | ICD-10-CM | POA: Diagnosis not present

## 2017-07-02 DIAGNOSIS — Z90722 Acquired absence of ovaries, bilateral: Secondary | ICD-10-CM | POA: Diagnosis not present

## 2017-07-02 HISTORY — PX: RIGHT/LEFT HEART CATH AND CORONARY ANGIOGRAPHY: CATH118266

## 2017-07-02 LAB — POCT I-STAT 3, ART BLOOD GAS (G3+)
ACID-BASE DEFICIT: 1 mmol/L (ref 0.0–2.0)
Acid-base deficit: 1 mmol/L (ref 0.0–2.0)
Bicarbonate: 24.7 mmol/L (ref 20.0–28.0)
Bicarbonate: 25 mmol/L (ref 20.0–28.0)
O2 Saturation: 67 %
O2 Saturation: 98 %
PCO2 ART: 45.1 mmHg (ref 32.0–48.0)
PCO2 ART: 48.5 mmHg — AB (ref 32.0–48.0)
PH ART: 7.321 — AB (ref 7.350–7.450)
PH ART: 7.347 — AB (ref 7.350–7.450)
PO2 ART: 38 mmHg — AB (ref 83.0–108.0)
TCO2: 26 mmol/L (ref 22–32)
TCO2: 26 mmol/L (ref 22–32)
pO2, Arterial: 113 mmHg — ABNORMAL HIGH (ref 83.0–108.0)

## 2017-07-02 SURGERY — RIGHT/LEFT HEART CATH AND CORONARY ANGIOGRAPHY
Anesthesia: LOCAL

## 2017-07-02 MED ORDER — SODIUM CHLORIDE 0.9 % IV SOLN: INTRAVENOUS | Status: DC

## 2017-07-02 MED ORDER — ASPIRIN 81 MG PO CHEW: 81.0000 mg | CHEWABLE_TABLET | ORAL | Status: DC

## 2017-07-02 MED ORDER — SODIUM CHLORIDE 0.9% FLUSH: 3.0000 mL | Freq: Two times a day (BID) | INTRAVENOUS | Status: DC

## 2017-07-02 MED ORDER — MIDAZOLAM HCL 2 MG/2ML IJ SOLN
INTRAMUSCULAR | Status: DC | PRN
  Administered 2017-07-02 (×2): 1 mg via INTRAVENOUS

## 2017-07-02 MED ORDER — VERAPAMIL HCL 2.5 MG/ML IV SOLN
INTRAVENOUS | Status: DC | PRN
  Administered 2017-07-02: 10 mL via INTRA_ARTERIAL

## 2017-07-02 MED ORDER — HEPARIN (PORCINE) IN NACL 2-0.9 UNIT/ML-% IJ SOLN
INTRAMUSCULAR | Status: AC
  Filled 2017-07-02: qty 500

## 2017-07-02 MED ORDER — SODIUM CHLORIDE 0.9 % IV SOLN: 250.0000 mL | INTRAVENOUS | Status: DC | PRN

## 2017-07-02 MED ORDER — FENTANYL CITRATE (PF) 100 MCG/2ML IJ SOLN
INTRAMUSCULAR | Status: AC
  Filled 2017-07-02: qty 2

## 2017-07-02 MED ORDER — SODIUM CHLORIDE 0.9 % WEIGHT BASED INFUSION: 1.0000 mL/kg/h | INTRAVENOUS | Status: DC

## 2017-07-02 MED ORDER — IOPAMIDOL (ISOVUE-370) INJECTION 76%
INTRAVENOUS | Status: AC
  Filled 2017-07-02: qty 100

## 2017-07-02 MED ORDER — SODIUM CHLORIDE 0.9 % IV SOLN
250.0000 mL | INTRAVENOUS | Status: DC | PRN
Start: 2017-07-02 — End: 2017-07-02

## 2017-07-02 MED ORDER — SODIUM CHLORIDE 0.9 % WEIGHT BASED INFUSION
3.0000 mL/kg/h | INTRAVENOUS | Status: AC
  Administered 2017-07-02: 3 mL/kg/h via INTRAVENOUS

## 2017-07-02 MED ORDER — MIDAZOLAM HCL 2 MG/2ML IJ SOLN
INTRAMUSCULAR | Status: AC
  Filled 2017-07-02: qty 2

## 2017-07-02 MED ORDER — HEPARIN SODIUM (PORCINE) 1000 UNIT/ML IJ SOLN
INTRAMUSCULAR | Status: AC
  Filled 2017-07-02: qty 1

## 2017-07-02 MED ORDER — LIDOCAINE HCL (PF) 1 % IJ SOLN
INTRAMUSCULAR | Status: AC
  Filled 2017-07-02: qty 30

## 2017-07-02 MED ORDER — VERAPAMIL HCL 2.5 MG/ML IV SOLN
INTRAVENOUS | Status: AC
  Filled 2017-07-02: qty 2

## 2017-07-02 MED ORDER — OXYCODONE HCL 5 MG PO TABS: 5.0000 mg | ORAL_TABLET | ORAL | Status: DC | PRN

## 2017-07-02 MED ORDER — SODIUM CHLORIDE 0.9% FLUSH: 3.0000 mL | INTRAVENOUS | Status: DC | PRN

## 2017-07-02 MED ORDER — FENTANYL CITRATE (PF) 100 MCG/2ML IJ SOLN
INTRAMUSCULAR | Status: DC | PRN
  Administered 2017-07-02: 50 ug via INTRAVENOUS

## 2017-07-02 MED ORDER — IOPAMIDOL (ISOVUE-370) INJECTION 76%
INTRAVENOUS | Status: DC | PRN
  Administered 2017-07-02: 90 mL via INTRA_ARTERIAL

## 2017-07-02 MED ORDER — HEPARIN SODIUM (PORCINE) 1000 UNIT/ML IJ SOLN
INTRAMUSCULAR | Status: DC | PRN
  Administered 2017-07-02: 4500 [IU] via INTRAVENOUS

## 2017-07-02 MED ORDER — HEPARIN (PORCINE) IN NACL 2-0.9 UNIT/ML-% IJ SOLN
INTRAMUSCULAR | Status: AC | PRN
  Administered 2017-07-02: 1000 mL via INTRA_ARTERIAL

## 2017-07-02 MED ORDER — ONDANSETRON HCL 4 MG/2ML IJ SOLN: 4.0000 mg | Freq: Four times a day (QID) | INTRAMUSCULAR | Status: DC | PRN

## 2017-07-02 MED ORDER — LIDOCAINE HCL (PF) 1 % IJ SOLN
INTRAMUSCULAR | Status: DC | PRN
  Administered 2017-07-02 (×2): 2 mL via INTRADERMAL

## 2017-07-02 MED ORDER — ACETAMINOPHEN 325 MG PO TABS: 650.0000 mg | ORAL_TABLET | ORAL | Status: DC | PRN

## 2017-07-02 SURGICAL SUPPLY — 16 items
CATH BALLN WEDGE 5F 110CM (CATHETERS) ×1 IMPLANT
CATH INFINITI 5FR ANG PIGTAIL (CATHETERS) ×1 IMPLANT
CATH INFINITI JR4 5F (CATHETERS) ×1 IMPLANT
COVER PRB 48X5XTLSCP FOLD TPE (BAG) IMPLANT
COVER PROBE 5X48 (BAG) ×2
DEVICE RAD COMP TR BAND LRG (VASCULAR PRODUCTS) ×1 IMPLANT
GLIDESHEATH SLEND A-KIT 6F 22G (SHEATH) ×1 IMPLANT
GUIDEWIRE .025 260CM (WIRE) ×1 IMPLANT
GUIDEWIRE INQWIRE 1.5J.035X260 (WIRE) IMPLANT
INQWIRE 1.5J .035X260CM (WIRE) ×2
KIT HEART LEFT (KITS) ×2 IMPLANT
PACK CARDIAC CATHETERIZATION (CUSTOM PROCEDURE TRAY) ×2 IMPLANT
SHEATH GLIDE SLENDER 4/5FR (SHEATH) ×1 IMPLANT
SYR MEDRAD MARK V 150ML (SYRINGE) ×1 IMPLANT
TRANSDUCER W/STOPCOCK (MISCELLANEOUS) ×2 IMPLANT
TUBING CIL FLEX 10 FLL-RA (TUBING) ×2 IMPLANT

## 2017-07-02 NOTE — Interval H&P Note (Signed)
Cath Lab Visit (complete for each Cath Lab visit)  Clinical Evaluation Leading to the Procedure:   ACS: No.  Non-ACS:    Anginal Classification: No Symptoms  Anti-ischemic medical therapy: No Therapy  Non-Invasive Test Results: No non-invasive testing performed  Prior CABG: No previous CABG      History and Physical Interval Note:  07/02/2017 8:42 AM  [name]  has presented today for surgery, with the diagnosis of ms - chf  The various methods of treatment have been discussed with the patient and family. After consideration of risks, benefits and other options for treatment, the patient has consented to  Procedure(s): RIGHT/LEFT HEART CATH AND CORONARY ANGIOGRAPHY (N/A) as a surgical intervention .  The patient's history has been reviewed, patient examined, no change in status, stable for surgery.  I have reviewed the patient's chart and labs.  Questions were answered to the patient's satisfaction.     Belva Crome III

## 2017-07-02 NOTE — Discharge Instructions (Signed)

## 2017-07-20 NOTE — Progress Notes (Signed)
Cardiology Office Note    Date:  07/21/2017   ID:  [name], [name] [birth year], MRN [id number]  PCP:  Default, Provider, MD  Cardiologist: Sinclair Grooms, MD   Chief Complaint  Patient presents with  . Cardiac Valve Problem  . Coronary Artery Disease    History of Present Illness:  [name] is a 57 y.o. female with prior history of large right coronary/right ventricular/inferior wall infarction with aneurysm and papillary muscle rupture requiring mitral valve replacement with tissue prosthesis in 2011, ventricular tachycardia, AICD, ischemic cardiomyopathy, chronic systolic heart failure class 3 with most recent EF 40%, status post ICD implantation and metastatic ovarian cancer. Now with severe mitral stenosis due bioprosthesis degeneration.   Recent documentation of severe mitral stenosis due to degeneration of previously placed bioprosthetic mitral valve.  See catheterization data below.  Heart failure therapy has been compensated with diuretic therapy and alteration of medical regimen.  She has been referred to Dr. Roxy Manns but does not have an appointment until 08/04/17.  Because of relatively low blood pressures, furosemide was discontinued and spironolactone was started after the heart cath.  Despite being on spironolactone dyspnea returned and she has resumed furosemide 20 mg daily in addition.  She denies dyspnea and orthopnea.  She has granulosa cell ovarian cancer, but has a good prognosis according to Dr. Marin Olp.  Past Medical History:  Diagnosis Date  . Arthritis 02/06/2012   "everywhere"  . Asthma    as a child  . Automatic implantable cardioverter-defibrillator in situ    DR. Beckie Salts   . Bladder cancer [facility]) 2009   "injected medicine to get rid of it"  . Chronic systolic CHF (congestive heart failure) ([place])   . CKD (chronic kidney disease), stage III ([place])   . Coronary artery disease    a. large RV/inferior MI complicated by pseudoaneurysm s/p  aneurysemectomy and CABG 10/2009 with papillary muscle rupture requiring bioprosthetic mitral valve replacement in [facility]  . Granulosa cell carcinoma of ovary ([place]) 2007   right; "had chemo"  . H/O thymectomy   . High cholesterol   . History of mitral valve replacement with tissue graft 2011  . Hypothyroidism   . ICD (implantable cardiac defibrillator) in place   . Ischemic cardiomyopathy    a.  ICM and VT arrest 10/2009 s/p AICD (revision 01/2012 due to RV lead problem).  . Myocardial infarction ([place]) 2011  . Neuropathy    FEET AND HANDS - FROM CHEMO - BUT MUCH IMPROVED  . PAF (paroxysmal atrial fibrillation) ([place])   . Pain    JOINT PAINS AND MUSCLE ACHES ALL OVER.  Marland Kitchen Port-A-Cath in place    RIGHT UPPER CHEST  . SVT (supraventricular tachycardia) ([place])   . Tobacco abuse   . Ventricular tachycardia [facility])     Past Surgical History:  Procedure Laterality Date  . ABDOMINAL HYSTERECTOMY N/A 07/27/2013   Procedure: TUMOR EXCISION OF ABDOMINAL MASS;  Surgeon: Alvino Chapel, MD;  Location: [facility];  Service: Gynecology;  Laterality: N/A;  . APPENDECTOMY  1989  . BILATERAL OOPHORECTOMY  2007  . CARDIAC DEFIBRILLATOR PLACEMENT  02/06/2012   "lead change"  . CARDIAC VALVE REPLACEMENT    . CYSTOSCOPY Right 02/23/2013   Procedure: CYSTOSCOPY WITH STENT REMOVAL;  Surgeon: Alvino Chapel, MD;  Location: [facility];  Service: Gynecology;  Laterality: Right;  . IMPLANTABLE CARDIOVERTER DEFIBRILLATOR REVISION N/A 02/06/2012   Procedure: IMPLANTABLE CARDIOVERTER DEFIBRILLATOR REVISION;  Surgeon: Evans Lance,  MD;  Location: [place] CATH LAB;  Service: Cardiovascular;  Laterality: N/A;  . INSERT / REPLACE / REMOVE PACEMAKER  10/2009   DUAL-CHAMBER St. JUDE DEFIBRILLATOR  . LAPAROTOMY N/A 01/19/2013   Procedure: TUMOR DEBULKING / BOWEL RESECTION/INSERTION RIGHT UTERERAL DOUBLE J STENT/OMENTECTOMY;  Surgeon: Alvino Chapel, MD;  Location: [facility];  Service: Gynecology;  Laterality:  N/A;  . MITRAL VALVE REPLACEMENT  2011   "pig valve"  . RIGHT/LEFT HEART CATH AND CORONARY ANGIOGRAPHY N/A 07/02/2017   Procedure: RIGHT/LEFT HEART CATH AND CORONARY ANGIOGRAPHY;  Surgeon: Belva Crome, MD;  Location: [place] CV LAB;  Service: Cardiovascular;  Laterality: N/A;  . THYMUS REMOVED    . TONSILLECTOMY AND ADENOIDECTOMY  ~ 1967  . TOTAL HIP ARTHROPLASTY  05/06/2012   Procedure: TOTAL HIP ARTHROPLASTY;  Surgeon: Gearlean Alf, MD;  Location: [facility];  Service: Orthopedics;  Laterality: Right;  . TRANSURETHRAL RESECTION OF BLADDER  2009   "for bladder cancer"  . TUBAL LIGATION  1993  . VAGINAL HYSTERECTOMY  2001    Current Medications: Outpatient Medications Prior to Visit  Medication Sig Dispense Refill  . albuterol (PROVENTIL HFA;VENTOLIN HFA) 108 (90 Base) MCG/ACT inhaler Inhale 2 puffs into the lungs every 4 (four) hours as needed for wheezing or shortness of breath. 1 Inhaler 1  . aspirin 325 MG EC tablet Take 325 mg by mouth every morning.     Marland Kitchen b complex vitamins tablet Take 1 tablet by mouth 2 (two) times daily.     . carvedilol (COREG) 3.125 MG tablet Take 1 tablet (3.125 mg total) by mouth 2 (two) times daily with a meal. 60 tablet 1  . cholecalciferol (VITAMIN D-400) 400 UNITS TABS tablet Take 400 Units by mouth 2 (two) times daily.    . diphenhydrAMINE (BENADRYL) 25 MG tablet Take 25 mg by mouth every 6 (six) hours as needed for itching or allergies.     . furosemide (LASIX) 20 MG tablet Take 20 mg by mouth daily.    Marland Kitchen LORazepam (ATIVAN) 0.5 MG tablet TAKE 1 TABLET BY MOUTH EVERY 6 HOURS AS NEEDED FOR NAUSEA AND VOMITING (Patient taking differently: TAKE 0.5 MG BY MOUTH EVERY 6 HOURS AS NEEDED FOR NAUSEA AND VOMITING) 60 tablet 2  . Multiple Vitamins-Minerals (MULTIVITAMIN PO) Take 1 tablet by mouth daily.     Marland Kitchen spironolactone (ALDACTONE) 25 MG tablet Take 1 tablet (25 mg total) by mouth daily. 90 tablet 3  . Vitamin D, Ergocalciferol, (DRISDOL) 50000 units CAPS  capsule TAKE 1 CAPSULE (50,000 UNITS TOTAL) BY MOUTH EVERY 7 (SEVEN) DAYS. (Patient taking differently: Take 50,000 Units by mouth every Thursday. ) 24 capsule 2  . zolpidem (AMBIEN) 5 MG tablet TAKE ONE TABLET BY MOUTH AT BEDTIME (Patient taking differently: TAKE 5 MG BY MOUTH AT BEDTIME) 30 tablet 2   No facility-administered medications prior to visit.      Allergies:   Prochlorperazine edisylate; Adhesive [tape]; and Prochlorperazine   Social History   Socioeconomic History  . Marital status: Divorced    Spouse name: None  . Number of children: None  . Years of education: None  . Highest education level: None  Social Needs  . Financial resource strain: None  . Food insecurity - worry: None  . Food insecurity - inability: None  . Transportation needs - medical: None  . Transportation needs - non-medical: None  Occupational History    Comment: WORKS FULL TIME  Tobacco Use  . Smoking status: Current Some  Day Smoker    Packs/day: 0.50    Years: 30.00    Pack years: 15.00    Types: Cigarettes    Start date: 03/28/1974  . Smokeless tobacco: Never Used  Substance and Sexual Activity  . Alcohol use: No    Alcohol/week: 0.0 oz  . Drug use: No  . Sexual activity: None  Other Topics Concern  . None  Social History Narrative   WORKS FULL TIME   SINGLE   TOBACCO USE-YES   IMPLANTATION OF DUAL-CHAMBER St[employer] JUDE DEFIBRILLATOR     Family History:  The patient's family history includes Heart attack in her brother, maternal grandfather, and paternal grandfather; Stroke in her maternal grandmother.   ROS:   Please see the history of present illness.    Firm nodule medial aspect of the left humerus in the distribution of the brachial artery.  Resolving ecchymosis. All other systems reviewed and are negative.   PHYSICAL EXAM:   VS:  BP 108/66   Pulse 90   Ht 5\' 6"  (1.676 m)   Wt 196 lb 12.8 oz (89.3 kg)   BMI 31.76 kg/m    GEN: Well nourished, well developed, in no acute  distress  HEENT: normal  Neck: no JVD, carotid bruits, or masses Cardiac: RRR; no murmurs, rubs, or gallops,no edema  Respiratory:  clear to auscultation bilaterally, normal work of breathing GI: soft, nontender, nondistended, + BS MS: no deformity or atrophy  Skin: warm and dry, no rash Neuro:  Alert and Oriented x 3, Strength and sensation are intact Psych: euthymic mood, full affect  Wt Readings from Last 3 Encounters:  07/21/17 196 lb 12.8 oz (89.3 kg)  07/02/17 201 lb (91.2 kg)  06/11/17 203 lb (92.1 kg)      Studies/Labs Reviewed:   EKG:  EKG  Not done  Recent Labs: 06/03/2017: B Natriuretic Peptide 464.0 06/04/2017: ALT 19 06/06/2017: Magnesium 2.3 06/26/2017: BUN 13; Creatinine, Ser 1.24; Hemoglobin 12.4; Platelets 200; Potassium 4.1; Sodium 142   Lipid Panel No results found for: CHOL, TRIG, HDL, CHOLHDL, VLDL, LDLCALC, LDLDIRECT  Additional studies/ records that were reviewed today include:   CARDIAC CATH  07/12/17 Conclusion    Severe mitral prosthesis stenosis with calculated valve area of 1.02 cm square.  This is based upon a mean gradient across the mitral valve of 18.8 mmHg.  No mitral regurgitation is noted by left ventriculography.  Mild pulmonary hypertension with mean pressure 30 mmHg (42/19 mmHg).  Inferobasal dyskinesis with akinesis of the mid and apical inferior wall.  EF 30-35%.  Normal anterior wall motion.  Normal LVEDP.  Widely patent left main, LAD, and circumflex coronary artery.  The second obtuse marginal contains eccentric proximal 50% narrowing.  Dominant right coronary with mid vessel 80% segmental stenosis with appearance of dissection representing recanalized total occlusion from the culprit event in 2011.  The patient is not having angina and the inferior wall is akinetic.  No indication for PCI in absence of ischemic symptoms.      ASSESSMENT:    1. History of mitral valve prosthesis   2. Athscl autologous vein CABG w oth angina  pectoris ([place])   3. Chronic systolic HF (heart failure) (HCC)   4. Paroxysmal atrial fibrillation (HCC)   5. Stenosis of prosthetic mitral valve   6. CKD (chronic kidney disease) stage 3, GFR 30-59 ml/min (HCC)      PLAN:  In order of problems listed above:  1. Hemodynamic documentation of prior echo findings consistent  with severe mitral stenosis due to bioprosthetic valve degeneration.  Will refer to Dr. Darylene Price, 08/04/17. 2. Recent catheterization demonstrated that the patient did not undergo coronary bypass grafting and that her coronaries are widely patent with the most severe lesion being a 80% lesion in the native right coronary which was the culprit for the patient's inferior infarct.  Assumption is that the inferior wall all scar.  Asymptomatic from standpoint of angina.  No intervention is felt necessary. 3. Elevated wedge pressure related to mitral valve degeneration.  Continue therapy with both furosemide and Aldactone.  Basic metabolic panel today to rule out kidney/electrolyte disturbance on the current regimen. 4. History of paroxysmal atrial fibrillation.  This occurred post mitral valve surgery and has not been a clinical issue since that time. 5. See #1 above. 6. Basic metabolic panel today.  Follow-up after seen by Dr. Roxy Manns.  Medication Adjustments/Labs and Tests Ordered: Current medicines are reviewed at length with the patient today.  Concerns regarding medicines are outlined above.  Medication changes, Labs and Tests ordered today are listed in the Patient Instructions below. There are no Patient Instructions on file for this visit.   Signed, Sinclair Grooms, MD  07/21/2017 11:01 AM    [facility] [address], [place], [place]  [address] Phone: [phone]; Fax: [phone]

## 2017-07-21 ENCOUNTER — Ambulatory Visit (INDEPENDENT_AMBULATORY_CARE_PROVIDER_SITE_OTHER): Payer: BLUE CROSS/BLUE SHIELD | Admitting: Interventional Cardiology

## 2017-07-21 ENCOUNTER — Encounter: Payer: Self-pay | Admitting: Interventional Cardiology

## 2017-07-21 VITALS — BP 108/66 | HR 90 | Ht 66.0 in | Wt 196.8 lb

## 2017-07-21 DIAGNOSIS — I25718 Atherosclerosis of autologous vein coronary artery bypass graft(s) with other forms of angina pectoris: Secondary | ICD-10-CM | POA: Diagnosis not present

## 2017-07-21 DIAGNOSIS — I5022 Chronic systolic (congestive) heart failure: Secondary | ICD-10-CM

## 2017-07-21 DIAGNOSIS — I480 Paroxysmal atrial fibrillation: Secondary | ICD-10-CM

## 2017-07-21 DIAGNOSIS — Z952 Presence of prosthetic heart valve: Secondary | ICD-10-CM | POA: Diagnosis not present

## 2017-07-21 DIAGNOSIS — T82857A Stenosis of cardiac prosthetic devices, implants and grafts, initial encounter: Secondary | ICD-10-CM | POA: Diagnosis not present

## 2017-07-21 DIAGNOSIS — N183 Chronic kidney disease, stage 3 unspecified: Secondary | ICD-10-CM

## 2017-07-21 LAB — BASIC METABOLIC PANEL
BUN / CREAT RATIO: 10 (ref 9–23)
BUN: 13 mg/dL (ref 6–24)
CO2: 23 mmol/L (ref 20–29)
Calcium: 9.5 mg/dL (ref 8.7–10.2)
Chloride: 99 mmol/L (ref 96–106)
Creatinine, Ser: 1.32 mg/dL — ABNORMAL HIGH (ref 0.57–1.00)
GFR calc Af Amer: 52 mL/min/{1.73_m2} — ABNORMAL LOW (ref >59)
GFR calc non Af Amer: 45 mL/min/{1.73_m2} — ABNORMAL LOW (ref >59)
Glucose: 88 mg/dL (ref 65–99)
POTASSIUM: 3.7 mmol/L (ref 3.5–5.2)
SODIUM: 140 mmol/L (ref 134–144)

## 2017-07-21 NOTE — Patient Instructions (Addendum)
Medication Instructions:  Your physician recommends that you continue on your current medications as directed. Please refer to the Current Medication list given to you today.  Labwork: BMET TODAY  Testing/Procedures: None  Follow-Up: Your physician recommends that you schedule a follow-up appointment in: 3-6 months with Dr. Tamala Julian.    Any Other Special Instructions Will Be Listed Below (If Applicable).     If you need a refill on your cardiac medications before your next appointment, please call your pharmacy.

## 2017-07-30 ENCOUNTER — Other Ambulatory Visit: Payer: Self-pay

## 2017-07-30 MED ORDER — CARVEDILOL 3.125 MG PO TABS: 3.1250 mg | ORAL_TABLET | Freq: Two times a day (BID) | ORAL | 3 refills | Status: DC

## 2017-07-30 MED ORDER — CARVEDILOL 3.125 MG PO TABS: 3.1250 mg | ORAL_TABLET | Freq: Two times a day (BID) | ORAL | 1 refills | Status: DC

## 2017-07-30 NOTE — Addendum Note (Signed)
Addended by: De Burrs on: 07/30/2017 02:27 PM   Modules accepted: Orders

## 2017-07-31 MED ORDER — CARVEDILOL 3.125 MG PO TABS: 3.1250 mg | ORAL_TABLET | Freq: Two times a day (BID) | ORAL | 3 refills | Status: DC

## 2017-07-31 NOTE — Addendum Note (Signed)
Addended by: Juventino Slovak on: 07/31/2017 09:38 AM   Modules accepted: Orders

## 2017-08-01 ENCOUNTER — Other Ambulatory Visit: Payer: Self-pay | Admitting: Interventional Cardiology

## 2017-08-03 ENCOUNTER — Other Ambulatory Visit: Payer: Self-pay | Admitting: Interventional Cardiology

## 2017-08-04 ENCOUNTER — Institutional Professional Consult (permissible substitution) (INDEPENDENT_AMBULATORY_CARE_PROVIDER_SITE_OTHER): Payer: BLUE CROSS/BLUE SHIELD | Admitting: Thoracic Surgery (Cardiothoracic Vascular Surgery)

## 2017-08-04 ENCOUNTER — Encounter: Payer: Self-pay | Admitting: Thoracic Surgery (Cardiothoracic Vascular Surgery)

## 2017-08-04 ENCOUNTER — Other Ambulatory Visit: Payer: Self-pay

## 2017-08-04 ENCOUNTER — Other Ambulatory Visit: Payer: Self-pay | Admitting: Interventional Cardiology

## 2017-08-04 VITALS — BP 106/70 | HR 104 | Resp 18 | Ht 66.0 in | Wt 195.8 lb

## 2017-08-04 DIAGNOSIS — Z953 Presence of xenogenic heart valve: Secondary | ICD-10-CM | POA: Diagnosis not present

## 2017-08-04 DIAGNOSIS — T8209XA Other mechanical complication of heart valve prosthesis, initial encounter: Secondary | ICD-10-CM | POA: Diagnosis not present

## 2017-08-04 DIAGNOSIS — T82857A Stenosis of cardiac prosthetic devices, implants and grafts, initial encounter: Secondary | ICD-10-CM

## 2017-08-04 DIAGNOSIS — I5042 Chronic combined systolic (congestive) and diastolic (congestive) heart failure: Secondary | ICD-10-CM

## 2017-08-04 MED ORDER — CARVEDILOL 3.125 MG PO TABS: 3.1250 mg | ORAL_TABLET | Freq: Two times a day (BID) | ORAL | 3 refills | Status: DC

## 2017-08-04 NOTE — Progress Notes (Signed)
HEART AND [place] SURGERY CONSULTATION REPORT  Referring Provider is Belva Crome, MD PCP is Default, Provider, MD  Chief Complaint  Patient presents with  . New Patient (Initial Visit)    redo MVR, Cath 07/02/2017, CT Chest 05/08/2017, Echo 06/04/2017    HPI:  Patient is a 57 year old female with history of coronary artery disease status post acute inferior wall myocardial infarction in [id number] complicated by postinfarction papillary muscle rupture and left ventricular aneurysm who underwent urgent repair of her aneurysm with mitral valve replacement using a bioprosthetic tissue valve, ventricular tachycardia status post ICD implantation, ischemic cardiomyopathy, and chronic systolic congestive heart failure who has been referred for surgical consultation to discuss treatment options for management of prosthetic valve dysfunction with severe symptomatic mitral stenosis.  The patient's history of cardiac disease dates back to April 2011 when she was on vacation at [facility] and developed acute congestive heart failure.  She was diagnosed with severe acute mitral regurgitation secondary to postinfarction papillary muscle rupture and underwent urgent surgical intervention including mitral valve replacement using a 29 millimeter Edwards Perimount bovine stented pericardial tissue valve and repair of aneurysm of the inferior left ventricular wall.  A portion of saphenous vein was removed from the patient's left thigh although coronary artery bypass grafting was not performed.  The patient was told that the entire inferior wall was necrotic.  Her postoperative recovery was notable for recurrent ventricular tachycardia for which she underwent implantation of an ICD.  She slowly recovered but has remained stable from a cardiac standpoint ever since.  During the interim the patient has developed recurrent ovarian cancer with multiple  intra-abdominal metastases treated with surgical resection on 2 previous occasions in 2014 and 2015.  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  She has been followed locally by Dr. Tamala Julian and Dr. Lovena Le for management of chronic systolic congestive heart failure and indwelling ICD.    Patient states that she has had long-standing symptoms of mild exertional shortness of breath as well as chronic pain in her muscles and joints related to her underlying malignancy.  She is not very active physically.  In early December 2018 she was hospitalized with pancolitis.  At the time she had acute exacerbation of shortness of breath, and follow-up transthoracic echocardiogram revealed severe mitral stenosis with mean transvalvular gradient estimated 21 mmHg across the bioprosthetic valve in the mitral position.  Left ventricular ejection fraction was reported to have drop slightly to 35-40% with global hypokinesis and inferior wall akinesis.  Symptoms improved with medical therapy, but she has continued to report moderate symptoms of exertional shortness of breath.  The patient was seen in follow-up by Dr. Tamala Julian and underwent left and right heart catheterization on July 02, 2017.  She was found to have severe prosthetic valve dysfunction with severe mitral stenosis.  Mean transvalvular gradient across the mitral valve was estimated at 18.8 mmHg, corresponding to mitral valve area calculated 1.02 cm.  There was mild pulmonary hypertension.  There was inferobasal dyskinesis with akinesis of the mid and apical inferior wall.  Ejection fraction was estimated 30-35%.  There was normal left ventricular end-diastolic pressure.  There was 80% stenosis of the mid right coronary artery with nonobstructive disease in the left coronary system.  Cardiothoracic surgical consultation was requested.  Patient is single but lives with her  ex-husband in [place].  She works full-time as  an Glass blower/designer for [employer].  She lives a sedentary lifestyle.  She is limited by exertional shortness of breath chronic pain and weakness related to her underlying malignancy and arthritis in her left hip.  She reports that she gets short of breath with moderate low level activity.  She denies any resting shortness of breath.  At the time of her hospitalization in early December she had resting shortness of breath and orthopnea.  This has improved.  She has not had chest pain or chest tightness either with activity or at rest.  She denies lower extremity edema.  She has not had palpitations, dizzy spells, nor syncope.  Her defibrillator has never discharged.  Past Medical History:  Diagnosis Date  . Arthritis    "everywhere"  . Asthma    as a child  . Automatic implantable cardioverter-defibrillator in situ    DR. Beckie Salts   . Bladder cancer [facility]) 2009   "injected medicine to get rid of it"  . Chronic systolic CHF (congestive heart failure) ([place])   . CKD (chronic kidney disease), stage III ([place])   . Coronary artery disease    a. large RV/inferior MI complicated by pseudoaneurysm s/p aneurysemectomy and CABG 10/2009 with papillary muscle rupture requiring bioprosthetic mitral valve replacement in [facility].  . Granulosa cell carcinoma of ovary ([place])    recurrent - surgical resection 2007, 2014 and 2016   . H/O thymectomy   . High cholesterol   . Hypothyroidism   . ICD (implantable cardiac defibrillator) in place   . Ischemic cardiomyopathy    a.  ICM and VT arrest 10/2009 s/p AICD (revision 01/2012 due to RV lead problem).  . Myocardial infarction ([place]) 10/14/2009  . Neuropathy    FEET AND HANDS - FROM CHEMO - BUT MUCH IMPROVED  . PAF (paroxysmal atrial fibrillation) ([place])   . Pain    JOINT PAINS AND MUSCLE ACHES ALL OVER.  Marland Kitchen Port-A-Cath in place    RIGHT UPPER CHEST  . Prosthetic valve dysfunction   . S/P  mitral valve replacement with bioprosthetic valve 10/21/2009   Edwards Perimount stented bovine pericardial tissue valve, size 29 mm  . SVT (supraventricular tachycardia) ([place])   . Tobacco abuse   . Ventricular tachycardia [facility])     Past Surgical History:  Procedure Laterality Date  . ABDOMINAL HYSTERECTOMY Ritter/A 07/27/2013   Procedure: TUMOR EXCISION OF ABDOMINAL MASS;  Surgeon: Alvino Chapel, MD;  Location: [facility] ORS;  Service: Gynecology;  Laterality: Ritter/A;  . APPENDECTOMY  1989  . BILATERAL OOPHORECTOMY  2007  . CARDIAC DEFIBRILLATOR PLACEMENT  02/06/2012   "lead change"  . CARDIAC VALVE REPLACEMENT    . CYSTOSCOPY Right 02/23/2013   Procedure: CYSTOSCOPY WITH STENT REMOVAL;  Surgeon: Alvino Chapel, MD;  Location: [facility];  Service: Gynecology;  Laterality: Right;  . IMPLANTABLE CARDIOVERTER DEFIBRILLATOR REVISION R[place]/A 02/06/2012   Procedure: IMPLANTABLE CARDIOVERTER DEFIBRILLATOR REVISION;  Surgeon: Evans Lance, MD;  Location: [facility] CATH LAB;  Service: Cardiovascular;  Laterality: Ritter/A;  . INSERT / REPLACE / REMOVE PACEMAKER  10/2009   DUAL-CHAMBER St. JUDE DEFIBRILLATOR  . LAPAROTOMY Ritter/A 01/19/2013   Procedure: TUMOR DEBULKING / BOWEL RESECTION/INSERTION RIGHT UTERERAL DOUBLE J STENT/OMENTECTOMY;  Surgeon: Alvino Chapel, MD;  Location: [facility];  Service: Gynecology;  Laterality: Ritter/A;  . MITRAL VALVE REPLACEMENT  10/21/2009   90mm Edwards Perimount bovine pericardial tissue valve - [place]  . RIGHT/LEFT HEART CATH AND CORONARY ANGIOGRAPHY Ritter/A  07/02/2017   Procedure: RIGHT/LEFT HEART CATH AND CORONARY ANGIOGRAPHY;  Surgeon: Belva Crome, MD;  Location: [facility] CV LAB;  Service: Cardiovascular;  Laterality: Ritter/A;  . THYMECTOMY  2009  . TONSILLECTOMY AND ADENOIDECTOMY  ~ 1967  . TOTAL HIP ARTHROPLASTY  05/06/2012   Procedure: TOTAL HIP ARTHROPLASTY;  Surgeon: Gearlean Alf, MD;  Location: [facility];  Service: Orthopedics;  Laterality: Right;  . TRANSURETHRAL  RESECTION OF BLADDER  2009   "for bladder cancer"  . TUBAL LIGATION  1993  . VAGINAL HYSTERECTOMY  2001    Family History  Problem Relation Age of Onset  . Heart attack Paternal Grandfather   . Heart attack Maternal Grandfather   . Stroke Maternal Grandmother   . Heart attack Brother     Social History   Socioeconomic History  . Marital status: Divorced    Spouse name: Not on file  . Number of children: Not on file  . Years of education: Not on file  . Highest education level: Not on file  Social Needs  . Financial resource strain: Not on file  . Food insecurity - worry: Not on file  . Food insecurity - inability: Not on file  . Transportation needs - medical: Not on file  . Transportation needs - non-medical: Not on file  Occupational History    Comment: WORKS FULL TIME  Tobacco Use  . Smoking status: Current Some Day Smoker    Packs/day: 0.50    Years: 30.00    Pack years: 15.00    Types: Cigarettes    Start date: 03/28/1974  . Smokeless tobacco: Never Used  Substance and Sexual Activity  . Alcohol use: No    Alcohol/week: 0.0 oz  . Drug use: No  . Sexual activity: Not on file  Other Topics Concern  . Not on file  Social History Narrative   WORKS FULL TIME   SINGLE   TOBACCO USE-YES   IMPLANTATION OF DUAL-CHAMBER St. JUDE DEFIBRILLATOR    Current Outpatient Medications  Medication Sig Dispense Refill  . albuterol (PROVENTIL HFA;VENTOLIN HFA) 108 (90 Base) MCG/ACT inhaler Inhale 2 puffs into the lungs every 4 (four) hours as needed for wheezing or shortness of breath. 1 Inhaler 1  . aspirin 325 MG EC tablet Take 81 mg by mouth daily.     Marland Kitchen b complex vitamins tablet Take 1 tablet by mouth 2 (two) times daily.     . cholecalciferol (VITAMIN D-400) 400 UNITS TABS tablet Take 400 Units by mouth 2 (two) times daily.    . diphenhydrAMINE (BENADRYL) 25 MG tablet Take 25 mg by mouth every 6 (six) hours as needed for itching or allergies.     . furosemide (LASIX) 20  MG tablet Take 20 mg by mouth daily.    Marland Kitchen LORazepam (ATIVAN) 0.5 MG tablet TAKE 1 TABLET BY MOUTH EVERY 6 HOURS AS NEEDED FOR NAUSEA AND VOMITING (Patient taking differently: TAKE 0.5 MG BY MOUTH EVERY 6 HOURS AS NEEDED FOR NAUSEA AND VOMITING) 60 tablet 2  . Multiple Vitamins-Minerals (MULTIVITAMIN PO) Take 1 tablet by mouth daily.     Marland Kitchen spironolactone (ALDACTONE) 25 MG tablet Take 1 tablet (25 mg total) by mouth daily. 90 tablet 3  . Vitamin D, Ergocalciferol, (DRISDOL) 50000 units CAPS capsule TAKE 1 CAPSULE (50,000 UNITS TOTAL) BY MOUTH EVERY 7 (SEVEN) DAYS. (Patient taking differently: Take 50,000 Units by mouth every Thursday. ) 24 capsule 2  . zolpidem (AMBIEN) 5 MG tablet TAKE ONE TABLET  BY MOUTH AT BEDTIME (Patient taking differently: TAKE 5 MG BY MOUTH AT BEDTIME) 30 tablet 2  . carvedilol (COREG) 3.125 MG tablet Take 1 tablet (3.125 mg total) by mouth 2 (two) times daily with a meal. 180 tablet 3   No current facility-administered medications for this visit.     Allergies  Allergen Reactions  . Prochlorperazine Edisylate Other (See Comments)    Nervous/ flutter/ shakes--compazine  . Adhesive [Tape] Other (See Comments)    Redness/skin peeling Redness/hives from adhesive tape( tolerates latex gloves); tolerates paper tape  . Prochlorperazine Other (See Comments)    Jitters, hyper      Review of Systems:   General:  normal appetite, decreased energy, no weight gain, + weight loss, no fever  Cardiac:  no chest pain with exertion, no chest pain at rest, +SOB with no exertion, no resting SOB, no PND, no orthopnea, no palpitations, no arrhythmia, no atrial fibrillation, no LE edema, no dizzy spells, no syncope  Respiratory:  + shortness of breath, no home oxygen, no productive cough, no dry cough, no bronchitis, + wheezing, no hemoptysis, no asthma, no pain with inspiration or cough, no sleep apnea, no CPAP at night  GI:   no difficulty swallowing, no reflux, no frequent heartburn,  no hiatal hernia, no abdominal pain, no constipation, no diarrhea, no hematochezia, no hematemesis, no melena  GU:   no dysuria,  no frequency, no urinary tract infection, no hematuria, no kidney stones, no kidney disease  Vascular:  no pain suggestive of claudication, no pain in feet, no leg cramps, no varicose veins, no DVT, no non-healing foot ulcer  Neuro:   no stroke, no TIA's, no seizures, no headaches, no temporary blindness one eye,  no slurred speech, no peripheral neuropathy, + chronic pain, no instability of gait, no memory/cognitive dysfunction  Musculoskeletal: + arthritis, + joint swelling, + myalgias, some difficulty walking, mildly decreased mobility   Skin:   no rash, no itching, no skin infections, no pressure sores or ulcerations  Psych:   no anxiety, no depression, no nervousness, no unusual recent stress  Eyes:   no blurry vision, + floaters, no recent vision changes, + wears glasses or contacts  ENT:   no hearing loss, no loose or painful teeth, no dentures, last saw dentist 05/29/2017  Hematologic:  no easy bruising, no abnormal bleeding, no clotting disorder, no frequent epistaxis  Endocrine:  no diabetes, does not check CBG's at home           Physical Exam:   BP 106/70 (BP Location: Left Arm, Patient Position: Sitting, Cuff Size: Large)   Pulse (!) 104   Resp 18   Ht 5\' 6"  (1.676 m)   Wt 195 lb 12.8 oz (88.8 kg)   SpO2 98% Comment: RA  BMI 31.60 kg/m   General:  Moderately obese,  well-appearing  HEENT:  Unremarkable   Neck:   no JVD, no bruits, no adenopathy   Chest:   clear to auscultation, symmetrical breath sounds, no wheezes, no rhonchi   CV:   RRR, Ritter murmur  Abdomen:  soft, non-tender, no masses   Extremities:  warm, well-perfused, pulses diminished but palpable, no LE edema  Rectal/GU  Deferred  Neuro:   Grossly non-focal and symmetrical throughout  Skin:   Clean and dry, no rashes, no breakdown   Diagnostic Tests:  Transthoracic  Echocardiography  Patient:    [name], [name] MR #:       [id number] Study Date: 06/04/2017  Gender:     F Age:        67 Height:     167.6 cm Weight:     91.2 kg BSA:        2.09 m^2 Pt. Status: Room:       [name], [name]  REFERRING    [place], [place] Ritter  ADMITTING    Thurnell Lose  PERFORMING   [facility], Inpatient  SONOGRAPHER  Darlina Sicilian, RDCS  cc:  ------------------------------------------------------------------- LV EF: 35% -   40%  ------------------------------------------------------------------- Indications:      Dyspnea 786.09.  ------------------------------------------------------------------- History:   PMH:  Cardiomyopathy Ischemic.  Atrial fibrillation. Coronary artery disease.  Congestive heart failure.  PMH: Myocardial infarction.  Risk factors:  Dyslipidemia.  ------------------------------------------------------------------- Study Conclusions  - Left ventricle: The cavity size was normal. Wall thickness was   normal. Systolic function was moderately reduced. The estimated   ejection fraction was in the range of 35% to 40%. Global   hypokinesis with basal to mid inferior akinesis. The study is not   technically sufficient to allow evaluation of LV diastolic   function. - Aortic valve: Sclerosis without stenosis. There was no   regurgitation. - Mitral valve: Bioprosthetic valve. Leaflets appear calcified with   minimal excursion. There is an increased mean transmitral   gradient of 21 mmHg and pressure 1/2t of 200 msec, suggestive of   severe mitral stenosis. Calculated AVA of 1.1 cm2 at a HR of 95.   There was mild regurgitation. Valve area by pressure half-time:   1.11 cm^2. - Left atrium: Moderately dilated. - Right ventricle: The cavity size was mildly dilated. Mildly   reduced RV systolic function. AICD wire noted in right ventricle. - Right atrium: The atrium was  normal in size. AICD wire noted in   right atrium. - Tricuspid valve: There was moderate regurgitation. - Pulmonary arteries: PA peak pressure: 65 mm Hg (S). - Inferior vena cava: The vessel was dilated. The respirophasic   diameter changes were blunted (< 50%), consistent with elevated   central venous pressure.  Impressions:  - Compared to a prior study in 2016, the LVEF is lower at 35-40%   with global hypokinesis and inferior akinesis. AICD wires are   present. There is a degenerate bioprosthetic mitral valve with   severe stenosis and mild regurgitation. The leafelts are   calcified and poorly mobile. Mean gradient of 21 mmHg (up from 16   mmHg) with calculated AVA of around 1.1 cm2 at HR of 95 bpm,   moderate LAE and moderate pulmonary hypertension with RVSP of 65   mmHg is noted.  ------------------------------------------------------------------- Labs, prior tests, procedures, and surgery: ACID.  Coronary artery bypass grafting (2011).     Mitral valve replacement with a bioprosthetic valve. Unknown type and size.  ------------------------------------------------------------------- Study data:  Comparison was made to the study of 06/01/2015.  Study status:  Routine.  Procedure:  The patient reported no pain pre or post test. Transthoracic echocardiography. Image quality was adequate.  Study completion:  There were no complications. Transthoracic echocardiography.  M-mode, complete 2D, spectral Doppler, and color Doppler.  Birthdate:  Patient birthdate: 1961-02-20.  Age:  Patient is 57 yr old.  Sex:  Gender: female. BMI: 32.5 kg/m^2.  Blood pressure:     98/70  Patient status: Inpatient.  Study date:  Study date: 06/04/2017. Study time: 08:58 AM.  Location:  Bedside.  -------------------------------------------------------------------  ------------------------------------------------------------------- Left ventricle:  The cavity size was normal. Wall thickness  was normal. Systolic function was moderately reduced. The estimated ejection fraction was in the range of 35% to 40%. Global hypokinesis with basal to mid inferior akinesis. The study is not technically sufficient to allow evaluation of LV diastolic function.  ------------------------------------------------------------------- Aortic valve:  Sclerosis without stenosis.  Doppler:  There was no regurgitation.  ------------------------------------------------------------------- Aorta:  Aortic root: The aortic root was normal in size. Ascending aorta: The ascending aorta was normal in size.  ------------------------------------------------------------------- Mitral valve:  Bioprosthetic valve. Leaflets appear calcified with minimal excursion. There is an increased mean transmitral gradient of 21 mmHg and pressure 1/2t of 200 msec, suggestive of severe mitral stenosis. Calculated AVA of 1.1 cm2 at a HR of 95.  Doppler:  There was mild regurgitation.    Valve area by pressure half-time: 1.11 cm^2. Indexed valve area by pressure half-time: 0.53 cm^2/m^2. Indexed valve area by continuity equation (using LVOT flow): 0.23 cm^2/m^2.    Mean gradient (D): 21 mm Hg. Peak gradient (D): 29 mm Hg.  ------------------------------------------------------------------- Left atrium:  Moderately dilated.  ------------------------------------------------------------------- Atrial septum:  Poorly visualized.  ------------------------------------------------------------------- Right ventricle:  The cavity size was mildly dilated. Mildly reduced RV systolic function. AICD wire noted in right ventricle.   ------------------------------------------------------------------- Pulmonic valve:    The valve appears to be grossly normal. Doppler:  There was trivial regurgitation.  ------------------------------------------------------------------- Tricuspid valve:   Doppler:  There was moderate  regurgitation.  ------------------------------------------------------------------- Pulmonary artery:   The main pulmonary artery was normal-sized.  ------------------------------------------------------------------- Right atrium:  The atrium was normal in size. AICD wire noted in right atrium.  ------------------------------------------------------------------- Pericardium:  There was no pericardial effusion.  ------------------------------------------------------------------- Systemic veins: Inferior vena cava: The vessel was dilated. The respirophasic diameter changes were blunted (< 50%), consistent with elevated central venous pressure.  ------------------------------------------------------------------- Measurements   Left ventricle                           Value          Reference  LV ID, ED, PLAX chordal          (H)     53.2  mm       43 - 52  LV ID, ES, PLAX chordal          (H)     48    mm       23 - 38  LV fx shortening, PLAX chordal   (L)     10    %        >=29  LV PW thickness, ED                      8.3   mm       ----------  IVS/LV PW ratio, ED                      1.02           <=1.3  Stroke volume, 2D                        38    ml       ----------  Stroke volume/bsa, 2D                    18    ml/m^2   ----------  LV ejection fraction, 1-p A4C            45    %        ----------  LV end-diastolic volume, 2-p             168   ml       ----------  LV end-systolic volume, 2-p              104   ml       ----------  LV ejection fraction, 2-p                38    %        ----------  Stroke volume, 2-p                       64    ml       ----------  LV end-diastolic volume/bsa, 2-p         80    ml/m^2   ----------  LV end-systolic volume/bsa, 2-p          50    ml/m^2   ----------  Stroke volume/bsa, 2-p                   30.6  ml/m^2   ----------    Ventricular septum                       Value          Reference  IVS thickness, ED                         8.49  mm       ----------    LVOT                                     Value          Reference  LVOT ID, S                               16    mm       ----------  LVOT area                                2.01  cm^2     ----------  LVOT peak velocity, S                    108   cm/s     ----------  LVOT mean velocity, S                    70.1  cm/s     ----------  LVOT VTI, S                              18.7  cm       ----------    Aorta                                    Value  Reference  Aortic root ID, ED                       26    mm       ----------    Left atrium                              Value          Reference  LA ID, A-P, ES                           51    mm       ----------  LA ID/bsa, A-P                   (H)     2.44  cm/m^2   <=2.2  LA volume, S                             84.5  ml       ----------  LA volume/bsa, S                         40.4  ml/m^2   ----------  LA volume, ES, 1-p A4C                   82.8  ml       ----------  LA volume/bsa, ES, 1-p A4C               39.6  ml/m^2   ----------  LA volume, ES, 1-p A2C                   86.2  ml       ----------  LA volume/bsa, ES, 1-p A2C               41.2  ml/m^2   ----------    Mitral valve                             Value          Reference  Mitral E-wave peak velocity              267   cm/s     ----------  Mitral A-wave peak velocity              229   cm/s     ----------  Mitral mean velocity, D                  220   cm/s     ----------  Mitral deceleration time         (H)     588   ms       150 - 230  Mitral pressure half-time                199   ms       ----------  Mitral mean gradient, D                  21    mm Hg    ----------  Mitral peak gradient, D  29    mm Hg    ----------  Mitral E/A ratio, peak                   1.2            ----------  Mitral valve area, PHT, DP               1.11  cm^2     ----------  Mitral valve area/bsa, PHT, DP           0.53   cm^2/m^2 ----------  Mitral valve area/bsa, LVOT              0.23  cm^2/m^2 ----------  continuity  Mitral annulus VTI, D                    79.1  cm       ----------  Mitral regurg VTI, PISA                  96.8  cm       ----------    Pulmonary arteries                       Value          Reference  PA pressure, S, DP               (H)     65    mm Hg    <=30    Tricuspid valve                          Value          Reference  Tricuspid regurg peak velocity           354   cm/s     ----------  Tricuspid peak RV-RA gradient            50    mm Hg    ----------    Right atrium                             Value          Reference  RA ID, S-I, ES, A4C                      46.9  mm       34 - 49  RA area, ES, A4C                         12.5  cm^2     8.3 - 19.5  RA volume, ES, A/L                       27.3  ml       ----------  RA volume/bsa, ES, A/L                   13    ml/m^2   ----------    Systemic veins                           Value          Reference  Estimated CVP  3     mm Hg    ----------    Right ventricle                          Value          Reference  TAPSE                                    16.5  mm       ----------  RV pressure, S, DP               (H)     53    mm Hg    <=30  RV s&', lateral, S                        9.79  cm/s     ----------  Legend: (L)  and  (H)  mark values outside specified reference range.  ------------------------------------------------------------------- Prepared and Electronically Authenticated by  Lyman Bishop MD 2018-12-12T10:16:10   RIGHT/LEFT HEART CATH AND CORONARY ANGIOGRAPHY  Conclusion    Severe mitral prosthesis stenosis with calculated valve area of 1.02 cm square.  This is based upon a mean gradient across the mitral valve of 18.8 mmHg.  No mitral regurgitation is noted by left ventriculography.  Mild pulmonary hypertension with mean pressure 30 mmHg (42/19 mmHg).  Inferobasal  dyskinesis with akinesis of the mid and apical inferior wall.  EF 30-35%.  Normal anterior wall motion.  Normal LVEDP.  Widely patent left main, LAD, and circumflex coronary artery.  The second obtuse marginal contains eccentric proximal 50% narrowing.  Dominant right coronary with mid vessel 80% segmental stenosis with appearance of dissection representing recanalized total occlusion from the culprit event in 2011.  The patient is not having angina and the inferior wall is akinetic.  No indication for PCI in absence of ischemic symptoms.  RECOMMENDATIONS:   Continue medical therapy for LV systolic dysfunction  Refer for surgical opinion concerning valve replacement.  Balloon valvotomy in the setting of bioprosthetic valve is not an option due to high risk of valve leaflet tearing and severe mitral regurgitation.  Indications   Prosthetic mitral valve stenosis [Y60.630Z (ICD-10-CM)]  Coronary artery disease involving native coronary artery of native heart without angina pectoris [I25.10 (ICD-10-CM)]  Chronic combined systolic and diastolic HF (heart failure) ([place]) [I50.42 (ICD-10-CM)]  Procedural Details/Technique   Technical Details The right radial area was sterilely prepped and draped. Intravenous sedation with Versed and fentanyl was administered. 1% Xylocaine was infiltrated to achieve local analgesia. Using real-time vascular ultrasound, a double wall stick with an angiocath was utilized to obtain intra-arterial access. A VUS image was saved for the permanent record.The modified Seldinger technique was used to place a 78F " Slender" sheath in the right radial artery. Weight based heparin was administered. Coronary angiography was done using 5 F catheters. Right and left coronary angiography was performed with a JR4 catheter. Left ventricular hemodynamic recordings were performed using the JR 4 catheter.  Right heart catheterization was performed by exchanging a previously placed  antecubital IV angio-cath for a 5 French Slender sheath. 1% Xylocaine was used to locally anesthetize the area around the IV site. The IV catheter was wired using an .018 guidewire. The modified Seldinger technique was used to place the 5 [place] sheath. The sheath would not advance completely. Double  glove technique was used to enhance sterility. After sheath insertion, a .018 steerable Glidewire was then advanced into the right atrium. The 6F Swan-Ganz catheter was then advanced over the guidewire. Right heart cath was performed using the 5 French balloon tipped catheter and fluoroscopic guidance. Pressures were recorded in each chamber and in the pulmonary capillary wedge position.. The main pulmonary artery O2 saturation was sampled. Simultaneous wedge LV recording was also performed.  Hemostasis was achieved using a pneumatic band at the radial access site and manual compression in the antecubital region.  During this procedure the patient is administered a total of Versed 2 mg and Fentanyl 50 mg to achieve and maintain moderate conscious sedation. The patient's heart rate, blood pressure, and oxygen saturation are monitored continuously during the procedure. The period of conscious sedation is 55 minutes, of which I was present face-to-face 100% of this time.   Estimated blood loss <50 mL.  During this procedure the patient was administered the following to achieve and maintain moderate conscious sedation: Versed 2 mg, Fentanyl 50 mcg, while the patient's heart rate, blood pressure, and oxygen saturation were continuously monitored. The period of conscious sedation was 50 minutes, of which I was present face-to-face 100% of this time.  Coronary Findings   Diagnostic  Dominance: Right  Left Circumflex  First Obtuse Marginal Branch  Vessel is small in size.  Second Obtuse Marginal Branch  Ost 2nd Mrg lesion 50% stenosed  Ost 2nd Mrg lesion is 50% stenosed.  Right Coronary Artery  Mid RCA  lesion 80% stenosed  Mid RCA lesion is 80% stenosed.  First Right Posterolateral  Vessel is small in size.  Intervention   No interventions have been documented.  Right Heart   Right Heart Pressures Hemodynamic findings consistent with mild pulmonary hypertension. LV EDP is normal.  Wall Motion   Resting               Left Heart   Left Ventricle The left ventricle is moderately dilated. There is moderate to severe left ventricular systolic dysfunction. LV end diastolic pressure is normal. The left ventricular ejection fraction is 35-45% by visual estimate. There are LV function abnormalities due to segmental dysfunction. There is no evidence of mitral regurgitation.  Coronary Diagrams   Diagnostic Diagram       Implants     No implant documentation for this case.  MERGE Images   Show images for CARDIAC CATHETERIZATION   Link to Procedure Log   Procedure Log    Hemo Data    Most Recent Value  Fick Cardiac Output 5.09 L/min  Fick Cardiac Output Index 2.55 (L/min)/BSA  RA A Wave 9 mmHg  RA V Wave 15 mmHg  RA Mean 9 mmHg  RV Systolic Pressure 45 mmHg  RV Diastolic Pressure 5 mmHg  RV EDP 10 mmHg  PA Systolic Pressure 42 mmHg  PA Diastolic Pressure 19 mmHg  PA Mean 30 mmHg  PW A Wave 37 mmHg  PW V Wave 42 mmHg  PW Mean 35 mmHg  AO Systolic Pressure 782 mmHg  AO Diastolic Pressure 67 mmHg  AO Mean 87 mmHg  LV Systolic Pressure 956 mmHg  LV Diastolic Pressure 6 mmHg  LV EDP 18 mmHg  Arterial Occlusion Pressure Extended Systolic Pressure 213 mmHg  Arterial Occlusion Pressure Extended Diastolic Pressure 61 mmHg  Arterial Occlusion Pressure Extended Mean Pressure 79 mmHg  Left Ventricular Apex Extended Systolic Pressure 086 mmHg  Left Ventricular Apex Extended Diastolic Pressure 9 mmHg  Left Ventricular Apex Extended EDP Pressure 20 mmHg  QP/QS 1  TPVR Index 11.79 HRUI  TSVR Index 31.84 HRUI  PVR SVR Ratio 0.11  TPVR/TSVR Ratio 0.37    STS Risk  Calculator  Procedure: MVR + CAB CALCULATE  Risk of Mortality:  4.954%   Renal Failure:  3.223%   Permanent Stroke:  1.337%   Prolonged Ventilation:  16.542%   DSW Infection:  0.256%   Reoperation:  1.995%   Morbidity or Mortality:  17.400%   Short Length of Stay:  11.416%   Long Length of Stay:  17.469%       Impression:  Patient has ischemic heart disease with chronic systolic congestive heart failure and underwent mitral valve replacement using a 29 mm Edwards paramount stented bovine pericardial tissue valve and repair of posterior left ventricular aneurysm in 2011 for management of severe acute mitral regurgitation caused by postinfarction papillary muscle rupture.  She now presents with prosthetic valve dysfunction causing severe symptomatic mitral stenosis.  She currently describes stable symptoms of exertional shortness of breath and fatigue consistent with chronic combined systolic and diastolic congestive heart failure, [place] Heart Association functional class IIB.  I have personally reviewed the patient's recent transthoracic echocardiogram and diagnostic cardiac catheterization.  Echocardiogram demonstrates presence of moderate left ventricular systolic dysfunction with inferior wall akinesis.  The anterior and lateral walls appear to move normally.  Ejection fraction was estimated 35%.  There is a bioprosthetic tissue valve in the mitral position.  The leaflets of the valve are thickened and restricted.  Mean transvalvular gradient across the mitral valve was estimated 21 mmHg corresponding to valve area calculated 1.1 cm.  There was mild mitral regurgitation with one moderate size broad jet and 2 small jets of MR.  The left atrium was dilated.  Diagnostic cardiac catheterization revealed single-vessel coronary artery disease with 80% stenosis in the mid right coronary artery.  There was mild nonobstructive disease in the left coronary system.  There was mild pulmonary  hypertension.  By catheterization mean transvalvular gradient across the mitral valve was estimated 18.8 mmHg corresponding to mitral valve area estimated 1.02 cm.  I agree that the patient would benefit from redo mitral valve replacement.  Risks associated with conventional surgery would probably be considerably higher than that predicted using the STS risk calculator because of the complexity of the patient's previous surgery (most notably the need for repair of left ventricular aneurysm), the patient's somewhat limited mobility and decreased level of conditioning, and perhaps most importantly by the patient's underlying metastatic ovarian cancer.  Although the patient's type of ovarian cancer is notably relatively slow growing, her long-term prognosis remains somewhat unclear.   Plan:  I discussed the nature of the patient's prosthetic valve dysfunction and treatment alternatives at length with the patient in the office today.  We discussed continued medical therapy, high risk redo mitral valve replacement with or without coronary artery bypass grafting, and valve-in-valve transcatheter mitral valve replacement.  The risks associated with conventional surgical redo mitral valve replacement were discussed in detail, as were expectations for her post-operative convalescence.  Issues specific to valve-in-valve transcatheter mitral valve replacement were discussed including questions about long term valve durability, surgical approaches including both transapical and transseptal, the potential for paravalvular leak, valve embolization into the left atrium, and other technical complications related to the procedure itself.  Long-term prognosis with medical therapy was discussed. This discussion was placed in the context of the patient's own specific clinical presentation and past  medical history.    The patient is interested in proceeding with further diagnostic workup with particular interest in whether or  not valve in valve transcatheter mitral valve replacement might be feasible.  Given the presence of her underlying malignancy this seems quite reasonable under the circumstances.  As a next step we will obtain operative reports from the patient's original surgery in [place].  The patient will undergo cardiac gated CT angiogram of the heart and CT angiogram of the aorta and iliac vessels.  Pulmonary function testing and a formal physical therapy evaluation will be performed.  The patient's clinical history and diagnostic tests will be reviewed by a multidisciplinary team of specialists and she will return to our office in approximately 3 weeks to discuss options further.  All of her questions have been addressed.   I spent in excess of 90 minutes during the conduct of this office consultation and >50% of this time involved direct face-to-face encounter with the patient for counseling and/or coordination of their care.     Valentina Gu. Roxy Manns, MD 08/04/2017 3:43 PM

## 2017-08-04 NOTE — Patient Instructions (Signed)
Continue all previous medications without any changes at this time  

## 2017-08-06 ENCOUNTER — Other Ambulatory Visit: Payer: Self-pay

## 2017-08-06 DIAGNOSIS — T82857A Stenosis of cardiac prosthetic devices, implants and grafts, initial encounter: Secondary | ICD-10-CM

## 2017-08-14 ENCOUNTER — Other Ambulatory Visit: Payer: Self-pay | Admitting: Hematology & Oncology

## 2017-08-22 ENCOUNTER — Ambulatory Visit (HOSPITAL_COMMUNITY): Payer: BLUE CROSS/BLUE SHIELD

## 2017-08-26 ENCOUNTER — Encounter (HOSPITAL_COMMUNITY): Payer: Self-pay

## 2017-08-26 ENCOUNTER — Ambulatory Visit (HOSPITAL_COMMUNITY)
Admission: RE | Admit: 2017-08-26 | Discharge: 2017-08-26 | Disposition: A | Discharge status: Home / Self Care | Payer: BLUE CROSS/BLUE SHIELD | Source: Ambulatory Visit | Attending: Thoracic Surgery (Cardiothoracic Vascular Surgery) | Admitting: Thoracic Surgery (Cardiothoracic Vascular Surgery)

## 2017-08-26 ENCOUNTER — Other Ambulatory Visit: Payer: Self-pay

## 2017-08-26 ENCOUNTER — Encounter: Payer: Self-pay | Admitting: Physical Therapy

## 2017-08-26 ENCOUNTER — Ambulatory Visit
Payer: BLUE CROSS/BLUE SHIELD | Attending: Thoracic Surgery (Cardiothoracic Vascular Surgery) | Admitting: Physical Therapy

## 2017-08-26 DIAGNOSIS — Z953 Presence of xenogenic heart valve: Secondary | ICD-10-CM | POA: Diagnosis not present

## 2017-08-26 DIAGNOSIS — I252 Old myocardial infarction: Secondary | ICD-10-CM | POA: Insufficient documentation

## 2017-08-26 DIAGNOSIS — C775 Secondary and unspecified malignant neoplasm of intrapelvic lymph nodes: Secondary | ICD-10-CM | POA: Diagnosis not present

## 2017-08-26 DIAGNOSIS — C786 Secondary malignant neoplasm of retroperitoneum and peritoneum: Secondary | ICD-10-CM | POA: Diagnosis not present

## 2017-08-26 DIAGNOSIS — I350 Nonrheumatic aortic (valve) stenosis: Secondary | ICD-10-CM | POA: Diagnosis not present

## 2017-08-26 DIAGNOSIS — I2510 Atherosclerotic heart disease of native coronary artery without angina pectoris: Secondary | ICD-10-CM | POA: Diagnosis not present

## 2017-08-26 DIAGNOSIS — I253 Aneurysm of heart: Secondary | ICD-10-CM | POA: Diagnosis not present

## 2017-08-26 DIAGNOSIS — J90 Pleural effusion, not elsewhere classified: Secondary | ICD-10-CM | POA: Diagnosis not present

## 2017-08-26 DIAGNOSIS — T82857A Stenosis of cardiac prosthetic devices, implants and grafts, initial encounter: Secondary | ICD-10-CM | POA: Insufficient documentation

## 2017-08-26 DIAGNOSIS — J449 Chronic obstructive pulmonary disease, unspecified: Secondary | ICD-10-CM | POA: Insufficient documentation

## 2017-08-26 DIAGNOSIS — F17210 Nicotine dependence, cigarettes, uncomplicated: Secondary | ICD-10-CM | POA: Insufficient documentation

## 2017-08-26 DIAGNOSIS — I700 Atherosclerosis of aorta: Secondary | ICD-10-CM | POA: Diagnosis not present

## 2017-08-26 DIAGNOSIS — R2689 Other abnormalities of gait and mobility: Secondary | ICD-10-CM | POA: Diagnosis not present

## 2017-08-26 DIAGNOSIS — Y838 Other surgical procedures as the cause of abnormal reaction of the patient, or of later complication, without mention of misadventure at the time of the procedure: Secondary | ICD-10-CM | POA: Diagnosis not present

## 2017-08-26 LAB — BASIC METABOLIC PANEL
Anion gap: 9 (ref 5–15)
BUN: 10 mg/dL (ref 6–20)
CHLORIDE: 104 mmol/L (ref 101–111)
CO2: 26 mmol/L (ref 22–32)
CREATININE: 1.09 mg/dL — AB (ref 0.44–1.00)
Calcium: 9 mg/dL (ref 8.9–10.3)
GFR calc non Af Amer: 55 mL/min — ABNORMAL LOW (ref >60)
Glucose, Bld: 92 mg/dL (ref 65–99)
Potassium: 3.8 mmol/L (ref 3.5–5.1)
Sodium: 139 mmol/L (ref 135–145)

## 2017-08-26 LAB — PULMONARY FUNCTION TEST
DL/VA % PRED: 86 %
DL/VA: 4.34 ml/min/mmHg/L
DLCO UNC: 19.52 ml/min/mmHg
DLCO unc % pred: 72 %
FEF 25-75 POST: 0.95 L/s
FEF 25-75 Pre: 0.65 L/sec
FEF2575-%CHANGE-POST: 47 %
FEF2575-%PRED-POST: 37 %
FEF2575-%Pred-Pre: 25 %
FEV1-%CHANGE-POST: 16 %
FEV1-%PRED-PRE: 44 %
FEV1-%Pred-Post: 51 %
FEV1-PRE: 1.24 L
FEV1-Post: 1.44 L
FEV1FVC-%CHANGE-POST: 5 %
FEV1FVC-%PRED-PRE: 78 %
FEV6-%Change-Post: 12 %
FEV6-%Pred-Post: 62 %
FEV6-%Pred-Pre: 55 %
FEV6-PRE: 1.94 L
FEV6-Post: 2.18 L
FEV6FVC-%Change-Post: 1 %
FEV6FVC-%PRED-PRE: 99 %
FEV6FVC-%Pred-Post: 100 %
FVC-%CHANGE-POST: 10 %
FVC-%PRED-POST: 61 %
FVC-%PRED-PRE: 55 %
FVC-POST: 2.23 L
FVC-PRE: 2.02 L
POST FEV1/FVC RATIO: 65 %
PRE FEV6/FVC RATIO: 96 %
Post FEV6/FVC ratio: 98 %
Pre FEV1/FVC ratio: 61 %
RV % PRED: 170 %
RV: 3.5 L
TLC % pred: 101 %
TLC: 5.41 L

## 2017-08-26 MED ORDER — SODIUM BICARBONATE 8.4 % IV SOLN
INTRAVENOUS | Status: AC
  Administered 2017-08-26: 15:00:00 via INTRAVENOUS
  Filled 2017-08-26: qty 500

## 2017-08-26 MED ORDER — METOPROLOL TARTRATE 5 MG/5ML IV SOLN
5.0000 mg | INTRAVENOUS | Status: DC | PRN
  Administered 2017-08-26: 5 mg via INTRAVENOUS
  Filled 2017-08-26: qty 5

## 2017-08-26 MED ORDER — ALBUTEROL SULFATE (2.5 MG/3ML) 0.083% IN NEBU
2.5000 mg | INHALATION_SOLUTION | Freq: Once | RESPIRATORY_TRACT | Status: AC
  Administered 2017-08-26: 2.5 mg via RESPIRATORY_TRACT

## 2017-08-26 MED ORDER — METOPROLOL TARTRATE 5 MG/5ML IV SOLN
INTRAVENOUS | Status: AC
  Filled 2017-08-26: qty 15

## 2017-08-26 MED ORDER — IOPAMIDOL (ISOVUE-370) INJECTION 76%
INTRAVENOUS | Status: AC
  Administered 2017-08-26: 100 mL
  Filled 2017-08-26: qty 100

## 2017-08-26 MED ORDER — SODIUM BICARBONATE BOLUS VIA INFUSION
INTRAVENOUS | Status: AC
  Administered 2017-08-26: 75 meq via INTRAVENOUS
  Filled 2017-08-26: qty 1

## 2017-08-26 NOTE — Therapy (Signed)
[place], [place], [address] Phone: [phone]   Fax:  [phone]  Physical Therapy Evaluation  Patient Details  Name: [name] MRN: [id number] Date of Birth: Oct 28, 1960 Referring Provider: Dr. Darylene Price   Encounter Date: 08/26/2017  PT End of Session - 08/26/17 0932    Visit Number  1    PT Start Time  0932    PT Stop Time  1003    PT Time Calculation (min)  31 min       Past Medical History:  Diagnosis Date  . Arthritis    "everywhere"  . Asthma    as a child  . Automatic implantable cardioverter-defibrillator in situ    DR. Beckie Salts   . Bladder cancer [facility]) 2009   "injected medicine to get rid of it"  . Chronic systolic CHF (congestive heart failure) ([place])   . CKD (chronic kidney disease), stage III ([place])   . Coronary artery disease    a. large RV/inferior MI complicated by pseudoaneurysm s/p aneurysemectomy and CABG 10/2009 with papillary muscle rupture requiring bioprosthetic mitral valve replacement in [facility].  . 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 H/O thymectomy   . High cholesterol   . Hypothyroidism   . ICD (implantable cardiac defibrillator) in place   . Ischemic cardiomyopathy    a.  ICM and VT arrest 10/2009 s/p AICD (revision 01/2012 due to RV lead problem).  . Myocardial infarction ([place]) 10/14/2009  . Neuropathy    FEET AND HANDS - FROM CHEMO - BUT MUCH IMPROVED  . PAF (paroxysmal atrial fibrillation) ([place])   . Pain    JOINT PAINS AND MUSCLE ACHES ALL OVER.  Marland Kitchen Port-A-Cath in place    RIGHT UPPER CHEST  . Prosthetic valve dysfunction   . S/P mitral valve replacement with bioprosthetic valve 10/21/2009   Edwards Perimount stented bovine pericardial tissue valve, size 29 mm  . SVT (supraventricular tachycardia) ([place])   . Tobacco abuse   . Ventricular tachycardia [facility])     Past Surgical History:   Procedure Laterality Date  . ABDOMINAL HYSTERECTOMY N/A 07/27/2013   Procedure: TUMOR EXCISION OF ABDOMINAL MASS;  Surgeon: Alvino Chapel, MD;  Location: [facility] ORS;  Service: Gynecology;  Laterality: N/A;  . APPENDECTOMY  1989  . BILATERAL OOPHORECTOMY  2007  . CARDIAC DEFIBRILLATOR PLACEMENT  02/06/2012   "lead change"  . CARDIAC VALVE REPLACEMENT    . CYSTOSCOPY Right 02/23/2013   Procedure: CYSTOSCOPY WITH STENT REMOVAL;  Surgeon: Alvino Chapel, MD;  Location: [facility];  Service: Gynecology;  Laterality: Right;  . IMPLANTABLE CARDIOVERTER DEFIBRILLATOR REVISION N/A 02/06/2012   Procedure: IMPLANTABLE CARDIOVERTER DEFIBRILLATOR REVISION;  Surgeon: Evans Lance, MD;  Location: [facility] CATH LAB;  Service: Cardiovascular;  Laterality: N/A;  . INSERT / REPLACE / REMOVE PACEMAKER  10/2009   DUAL-CHAMBER [place] JUDE DEFIBRILLATOR  . LAPAROTOMY N/A 01/19/2013   Procedure: TUMOR DEBULKING / BOWEL RESECTION/INSERTION RIGHT UTERERAL DOUBLE J STENT/OMENTECTOMY;  Surgeon: Alvino Chapel, MD;  Location: [facility];  Service: Gynecology;  Laterality: N/A;  . MITRAL VALVE REPLACEMENT  10/21/2009   79mm Edwards Perimount bovine pericardial tissue valve - [facility]  . RIGHT/LEFT HEART CATH AND CORONARY ANGIOGRAPHY N/A 07/02/2017   Procedure: RIGHT/LEFT HEART CATH AND CORONARY ANGIOGRAPHY;  Surgeon: Belva Crome, MD;  Location: [facility] CV LAB;  Service: Cardiovascular;  Laterality:  N/A;  . THYMECTOMY  2009  . TONSILLECTOMY AND ADENOIDECTOMY  ~ 1967  . TOTAL HIP ARTHROPLASTY  05/06/2012   Procedure: TOTAL HIP ARTHROPLASTY;  Surgeon: Gearlean Alf, MD;  Location: [facility];  Service: Orthopedics;  Laterality: Right;  . TRANSURETHRAL RESECTION OF BLADDER  2009   "for bladder cancer"  . TUBAL LIGATION  1993  . VAGINAL HYSTERECTOMY  2001    There were no vitals filed for this visit.   Subjective Assessment - 08/26/17 0933    Subjective  Pt reports significant shortness of breath over  the past couple of years. Has to take frequent breaks with walking, still able to work.     Patient Stated Goals  to fix heart    Currently in Pain?  No/denies         [facility] PT Assessment - 08/26/17 0001      Assessment   Medical Diagnosis  mitral prosthetic valve dysfunction    Referring Provider  Dr. Darylene Price    Onset Date/Surgical Date  08/04/17      Precautions   Precautions  None      Restrictions   Weight Bearing Restrictions  No      Balance Screen   Has the patient fallen in the past 6 months  No    Has the patient had a decrease in activity level because of a fear of falling?   No    Is the patient reluctant to leave their home because of a fear of falling?   No      Home Environment   Living Environment  Private residence    Living Arrangements  Spouse/significant other ex husband    Home Access  Stairs to enter    Entrance Stairs-Number of Steps  1    Entrance Stairs-Rails  None    Home Layout  One level      Prior Function   Level of Independence  Independent with community mobility without device not able to vacuum, do housework      Posture/Postural Control   Posture/Postural Control  Postural limitations    Postural Limitations  Rounded Shoulders;Forward head mild      ROM / Strength   AROM / PROM / Strength  AROM;Strength      AROM   Overall AROM Comments  grossly WNL      Strength   Overall Strength Comments  grossly 5/5 throughout except bil. hip flexion 4+/5    Strength Assessment Site  Hand    Right/Left hand  Right;Left    Right Hand Grip (lbs)  60 R hand dominant    Left Hand Grip (lbs)  60      Ambulation/Gait   Gait Comments  No significant deviations noted. Gait distance limited by 53% for age/gender.        [facility] Pre-Surgical Assessment - 08/26/17 0001    5 Meter Walk Test- trial 1  7 sec    5 Meter Walk Test- trial 2  6 sec.     5 Meter Walk Test- trial 3  5 sec.    5 meter walk test average  6 sec    4 Stage Balance Test  tolerated for:   10 sec.    4 Stage Balance Test Position  4    Sit To Stand Test- trial 1  11 sec.    ADL/IADL Independent with:  Bathing;Meal prep;Dressing;Finances    ADL/IADL Needs Assistance with:  Valla Leaver work    ADL/IADL  Fraility Index  Vulnerable    6 Minute Walk- Baseline  yes    BP (mmHg)  125/52    HR (bpm)  85    02 Sat (%RA)  97 %    Modified Borg Scale for Dyspnea  0- Nothing at all    Perceived Rate of Exertion (Borg)  6-    6 Minute Walk Post Test  yes    BP (mmHg)  132/84    HR (bpm)  106    02 Sat (%RA)  96 %    Modified Borg Scale for Dyspnea  3- Moderate shortness of breath or breathing difficulty    Perceived Rate of Exertion (Borg)  13- Somewhat hard    Aerobic Endurance Distance Walked  740           Objective measurements completed on examination: See above findings.                           Plan - 08/26/17 1514    Clinical Impression Statement  see below    PT Frequency  One time visit      Clinical Impression Statement: Pt is a 57 yo female presenting to OP PT for evaluation prior to possible TAVR surgery due to severe aortic stenosis. Pt reports exertional shortness of breath for several years. Symptoms are limiting her ability to walk any distance and perform housework. Pt presents with good ROM and strength, good balance and is not at high fall risk 4 stage balance test, good walking speed and poor aerobic endurance per 6 minute walk test. Pt ambulated 370 feet in 2:20 before requesting a seated rest beak lasting 1:30. Pt ambulated a total of 740 feet in 6 minute walk. Diastolic BP increased significantly with 6 minute walk test. Based on the Short Physical Performance Battery, patient has a frailty rating of 12/12 with </= 5/12 considered frail.   Patient demonstrated the following deficits and impairments:     Visit Diagnosis: Other abnormalities of gait and mobility     Problem List Patient Active Problem List    Diagnosis Date Noted  . Prosthetic valve dysfunction   . Stenosis of prosthetic mitral valve   . History of mitral valve prosthesis   . Dyspnea 06/04/2017  . Abdominal pain   . Pancolitis ([place]) 06/03/2017  . Paroxysmal atrial fibrillation ([name]) 09/02/2016  . COPD (chronic obstructive pulmonary disease) ([place]) 09/02/2016  . Hypoxia 05/28/2015  . Acute bronchitis 05/28/2015  . CKD (chronic kidney disease) stage 3, GFR 30-59 ml/min ([facility]) 05/28/2015  . Chronic anemia 05/28/2015  . Anxiety 03/08/2014  . Granulosa cell tumor of ovary 01/20/2013  . OA (osteoarthritis) of hip 05/06/2012  . Automatic implantable cardioverter-defibrillator in situ 11/08/2009  . Athscl autologous vein CABG w oth angina pectoris ([place]) 11/07/2009  . VENTRICULAR TACHYCARDIA 11/07/2009  . VENTRICULAR FIBRILLATION 11/07/2009  . Chronic combined systolic (congestive) and diastolic (congestive) heart failure ([place]) 11/07/2009  . S/P mitral valve replacement with bioprosthetic valve 10/21/2009    NICOLETTA,DANA, PT 08/26/2017, 3:14 PM  [facility] [address] [place], [place], [address] Phone: [phone]   Fax:  [phone]  Name: [name] MRN: [id number] Date of Birth: [birth year]

## 2017-08-26 NOTE — Progress Notes (Signed)
Patient d/c. Ambulated out of department alone with steady gait noted. No complaints at time of d/c.

## 2017-08-26 NOTE — Progress Notes (Signed)
TAVR protocol per dr Meda Coffee

## 2017-08-28 ENCOUNTER — Encounter: Payer: BLUE CROSS/BLUE SHIELD | Admitting: Internal Medicine

## 2017-09-01 ENCOUNTER — Encounter: Payer: Self-pay | Admitting: Thoracic Surgery (Cardiothoracic Vascular Surgery)

## 2017-09-01 ENCOUNTER — Ambulatory Visit (INDEPENDENT_AMBULATORY_CARE_PROVIDER_SITE_OTHER): Payer: BLUE CROSS/BLUE SHIELD | Admitting: Thoracic Surgery (Cardiothoracic Vascular Surgery)

## 2017-09-01 VITALS — BP 96/60 | HR 76 | Resp 20 | Ht 66.0 in | Wt 195.0 lb

## 2017-09-01 DIAGNOSIS — I5042 Chronic combined systolic (congestive) and diastolic (congestive) heart failure: Secondary | ICD-10-CM | POA: Diagnosis not present

## 2017-09-01 DIAGNOSIS — Z953 Presence of xenogenic heart valve: Secondary | ICD-10-CM | POA: Diagnosis not present

## 2017-09-01 DIAGNOSIS — T8209XD Other mechanical complication of heart valve prosthesis, subsequent encounter: Secondary | ICD-10-CM

## 2017-09-01 NOTE — Progress Notes (Signed)
[facility]       CARDIOTHORACIC SURGERY NOTE  Referring Provider is Belva Crome, MD PCP is Patient, No Pcp Per   HPI:  Patient returns the office today for follow-up of prosthetic valve dysfunction with severe symptomatic mitral valve stenosis and mitral regurgitation status post mitral valve replacement using a bioprosthetic tissue valve in 2011.  She was originally seen in consultation on August 04, 2017.  Since then she underwent CT angiography and she returns the office today to discuss treatment options further.  She describes stable symptoms of exertional shortness of breath and fatigue consistent with chronic combined systolic and diastolic congestive heart failure, New York Heart Association functional class IIb.   Current Outpatient Medications  Medication Sig Dispense Refill  . albuterol (PROVENTIL HFA;VENTOLIN HFA) 108 (90 Base) MCG/ACT inhaler Inhale 2 puffs into the lungs every 4 (four) hours as needed for wheezing or shortness of breath. 1 Inhaler 1  . aspirin 325 MG EC tablet Take 81 mg by mouth daily.     Marland Kitchen b complex vitamins tablet Take 1 tablet by mouth 2 (two) times daily.     . carvedilol (COREG) 3.125 MG tablet Take 1 tablet (3.125 mg total) by mouth 2 (two) times daily with a meal. 180 tablet 3  . cholecalciferol (VITAMIN D-400) 400 UNITS TABS tablet Take 400 Units by mouth 2 (two) times daily.    . diphenhydrAMINE (BENADRYL) 25 MG tablet Take 25 mg by mouth every 6 (six) hours as needed for itching or allergies.     . furosemide (LASIX) 20 MG tablet Take 20 mg by mouth daily.    Marland Kitchen LORazepam (ATIVAN) 0.5 MG tablet TAKE 1 TABLET BY MOUTH EVERY 6 HOURS AS NEEDED FOR NAUSEA AND VOMITING (Patient taking differently: TAKE 0.5 MG BY MOUTH EVERY 6 HOURS AS NEEDED FOR NAUSEA AND VOMITING) 60 tablet 2  . Multiple Vitamins-Minerals (MULTIVITAMIN PO) Take 1 tablet by mouth daily.     Marland Kitchen spironolactone (ALDACTONE) 25 MG  tablet Take 1 tablet (25 mg total) by mouth daily. 90 tablet 3  . Vitamin D, Ergocalciferol, (DRISDOL) 50000 units CAPS capsule TAKE 1 CAPSULE (50,000 UNITS TOTAL) BY MOUTH EVERY 7 (SEVEN) DAYS. (Patient taking differently: Take 50,000 Units by mouth every Thursday. ) 24 capsule 2  . zolpidem (AMBIEN) 5 MG tablet TAKE 1 TABLET BY MOUTH AT BEDTIME 30 tablet 2   No current facility-administered medications for this visit.       Physical Exam:   Ht 5\' 6"  (1.676 m)   BMI 31.60 kg/m   General:  Mildly obese but well-appearing  Chest:   Clear to auscultation  CV:   Regular rate and rhythm with systolic murmur  Incisions:  n/a  Abdomen:  Soft nontender  Extremities:  Warm and well perfused  Diagnostic Tests:  Cardiac TAVR CT  TECHNIQUE: The patient was scanned on a [employer]. A 120 kV retrospective scan was triggered in the descending thoracic aorta at 111 HU's. Gantry rotation speed was 250 msecs and collimation was .6 mm. No beta blockade or nitro were given. The 3D data set was reconstructed in 5% intervals of the R-R cycle. Systolic and diastolic phases were analyzed on a dedicated work station using MPR, MIP and VRT modes. The patient received 80 cc of contrast.  FINDINGS: Mitral valve: S/P MVR with a 29 mm Edwards-Perimount bovine stented pericardial tissue valve. Leaflets are thickened and have moderate calcifications. Leaflets  have severely limited opening with MVA 1.01 cm2.  Prosthetic valve ring measurements:  Maximum/Minimum Diameter: 26.9 x 26.9 mm  Perimeter: 83.3 mm  Area: 544 mm2  Aortic Valve: Trileaflet, no calcifications, opens well.  Aorta: Normal size, no dissection, mild diffuse atherosclerotic plaque and calcifications in the aortic arch and descending thoracic aorta.  Sinotubular Junction: 25 x 25 mm  Ascending Thoracic Aorta: 34 x 32 mm  Aortic Arch: 28 x 24 mm  Descending Thoracic Aorta: 20 x 19 mm  Sinus of  Valsalva Measurements:  Non-coronary: 31 mm  Right -coronary: 28 mm  Left -coronary: 29 mm  Coronary Arteries: Normal origin  No thrombus was seen in the left atrial appendage.  There is a large pseudoaneurysm in the basal and mid inferior wall measuring 55 x 30 mm.  Pulmonary artery is dilated measuring 33 x 30 mm.  PM leads are seen in the right atrium and right ventricle.  IMPRESSION: 1. S/P MVR with a 29 mm Edwards-Perimount bovine stented pericardial tissue valve. Leaflets are thickened and have moderate calcifications. Leaflets have severely limited opening with MVA 1.01 cm2. Bioprosthetic mitral valve ring measurements suitable for delivery of a 29 mm Edwards-SAPIEN 3 valve. This is in agreement with suggestions from the Valve in Valve for mitral valve app.  2.  No thrombus was seen in the left atrial appendage.  3. There is a large pseudoaneurysm in the basal and mid inferior wall measuring 55 x 30 mm.  4. Pulmonary artery is dilated measuring 33 x 30 mm suggestive of pulmonary hypertension.  5.  PM leads are seen in the right atrium and right ventricle.   Electronically Signed   By: Ena Dawley   On: 08/29/2017 13:45   CT ANGIOGRAPHY CHEST, ABDOMEN AND PELVIS  TECHNIQUE: Multidetector CT imaging through the chest, abdomen and pelvis was performed using the standard protocol during bolus administration of intravenous contrast. Multiplanar reconstructed images and MIPs were obtained and reviewed to evaluate the vascular anatomy.  CONTRAST:  100 mL of Isovue 300  COMPARISON:  Chest CT 06/05/2017. CT the abdomen and pelvis 06/03/2017.  FINDINGS: CTA CHEST FINDINGS  Cardiovascular: Heart size is mildly enlarged with apical aneurysm of the left ventricle where there is profound myocardial thinning, indicative of prior distal LAD territory myocardial infarction. There is also profound myocardial thinning in the inferior  wall segments from the mid ventricle to the base where there is pseudoaneurysm formation, presumably sequela of prior infarction. There is no significant pericardial fluid, thickening or pericardial calcification. There is aortic atherosclerosis, as well as atherosclerosis of the great vessels of the mediastinum and the coronary arteries, including calcified atherosclerotic plaque in the left anterior descending coronary artery. Status post median sternotomy for mitral valve replacement with a stented bioprosthesis. Left-sided pacemaker/AICD in place with lead tips terminating in the right atrium and right ventricular apex.  Mediastinum/Lymph Nodes: 15 mm short axis low right paratracheal lymph node, stable compared to the prior study. No other lymphadenopathy noted elsewhere in the mediastinal or hilar nodal stations. Esophagus is unremarkable in appearance. No axillary lymphadenopathy  Lungs/Pleura: 1 cm ground-glass attenuation nodule near the apex of the right upper lobe (axial image 27 of series 15), similar to prior study from 06/05/2017. No confluent consolidative airspace disease. Trace bilateral pleural effusions. Patchy areas of mild ground-glass attenuation noted throughout the lung bases, similar to the prior study.  Musculoskeletal/Soft Tissues: Median sternotomy wires. There are no aggressive appearing lytic or blastic lesions noted in the  visualized portions of the skeleton.  CTA ABDOMEN AND PELVIS FINDINGS  Hepatobiliary: 5 mm hypervascular lesion in segment 4A of the liver (image 98 of series 14), too small to characterize, but statistically likely a benign flash fill cavernous hemangioma or other perfusion anomaly. 8 mm low-attenuation lesion in segment 1 of the liver, decreased in size compared to prior examination 06/05/2017, presumably a partially involuted cyst. No other suspicious appearing hepatic lesions are noted. No intra or extrahepatic biliary  ductal dilatation. Gallbladder is normal in appearance.  Pancreas: No pancreatic mass. No pancreatic ductal dilatation. No pancreatic or peripancreatic fluid or inflammatory changes.  Spleen: Unremarkable.  Adrenals/Urinary Tract: Bilateral kidneys and bilateral adrenal glands are normal in appearance. No hydroureteronephrosis. Urinary bladder is normal in appearance.  Stomach/Bowel: Normal appearance of the stomach. No pathologic dilatation of small bowel or colon. Previously noted diffuse colonic wall thickening has resolved. The appendix is not confidently identified and may be surgically absent. Regardless, there are no inflammatory changes noted adjacent to the cecum to suggest the presence of an acute appendicitis at this time.  Vascular/Lymphatic: Aortic atherosclerosis with vascular findings and measurements pertinent to potential TAVR procedure, as detailed below. No aneurysm or dissection noted in the abdominal or pelvic vasculature. The celiac axis, superior mesenteric artery and inferior mesenteric artery are all widely patent without hemodynamically significant stenosis. Single renal arteries bilaterally are widely patent without hemodynamically significant stenosis. Multiple enlarging pelvic lymph nodes, largest of which is along the course of the inferior mesenteric artery measuring up to 2.2 cm in diameter (axial image 167 of series 14), compatible with nodal metastatic disease. Several additional lymph nodes are also noted in the internal iliac distributions, most evident in the presacral space where the largest of these currently measures 14 mm in short axis (axial image 184 of series 14).  Reproductive: Status post total abdominal hysterectomy and bilateral salpingo-oophorectomy.  Other: Multiple large soft tissue masses in the anatomic pelvis, increased in size compared to the prior examination. The largest of these masses is in the central anatomic  pelvis (axial image 186 of series 14), currently measuring 5.1 x 4.6 cm (previously 4.1 x 4.0 cm on 06/03/2017). Peritoneal implant in the right pericolic gutter (axial image 156 of series 14), similar to the prior study measuring 2.1 x 2.9 cm. No significant volume of ascites. No pneumoperitoneum.  Musculoskeletal: There are no aggressive appearing lytic or blastic lesions noted in the visualized portions of the skeleton. Status post right hip arthroplasty.  VASCULAR MEASUREMENTS PERTINENT TO TAVR:  AORTA:  Minimal Aortic Diameter-8 x 9 mm  Severity of Aortic Calcification-moderate  RIGHT PELVIS:  Right Common Iliac Artery -  Minimal Diameter-5.2 x 4.2 mm  Tortuosity-mild  Calcification-moderate  Right External Iliac Artery -  Minimal Diameter-5.8 x 5.9 mm  Tortuosity - mild  Calcification-none  Right Common Femoral Artery -  Minimal Diameter-5.5 x 4.7 mm  Tortuosity - mild  Calcification-mild  LEFT PELVIS:  Left Common Iliac Artery -  Minimal Diameter-6.5 x 4.6 mm  Tortuosity - mild  Calcification-moderate  Left External Iliac Artery -  Minimal Diameter-5.9 x 6.0 mm  Tortuosity - mild  Calcification-none  Left Common Femoral Artery -  Minimal Diameter-6.0 x 5.0 mm  Tortuosity - mild  Calcification-none  Review of the MIP images confirms the above findings.  IMPRESSION: 1. Vascular findings and measurements pertinent to potential TMVR procedure, as detailed above. 2. Status post median sternotomy for mitral valve replacement with a stented bioprosthesis noted. 3.  Aneurysmal dilatation of the left ventricular apex, and profound myocardial thinning in pseudoaneurysm of the inferior wall the left ventricle from the mid ventricle to the base, indicative of prior myocardial infarctions. 4. Trace bilateral pleural effusions lying dependently. Progressive intraperitoneal metastatic disease in the low anatomic  pelvis with developing lymph node metastases as well in the anatomic pelvis, as detailed above. 5. Aortic atherosclerosis, in addition to left anterior descending coronary artery disease. Please note that although the presence of coronary artery calcium documents the presence of coronary artery disease, the severity of this disease and any potential stenosis cannot be assessed on this non-gated CT examination. Assessment for potential risk factor modification, dietary therapy or pharmacologic therapy may be warranted, if clinically indicated. 6. Additional incidental findings, as above.  Aortic Atherosclerosis (ICD10-I70.0).   Electronically Signed   By: Vinnie Langton M.D.   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  She now presents with prosthetic valve dysfunction causing severe symptomatic mitral stenosis and mild mitral regurgitation.  She currently describes stable symptoms of exertional shortness of breath and fatigue consistent with chronic combined systolic and diastolic congestive heart failure, New York Heart Association functional class IIB.  I have personally reviewed the patient's recent transthoracic echocardiogram, diagnostic cardiac catheterization, and CT angiograms.  Echocardiogram demonstrates presence of moderate left ventricular systolic dysfunction with inferior wall akinesis.  The anterior and lateral walls appear to move normally.  Ejection fraction was estimated 35%.  There is a bioprosthetic tissue valve in the mitral position.  The leaflets of the valve are thickened and restricted.  Mean transvalvular gradient  across the mitral valve was estimated 21 mmHg corresponding to valve area calculated 1.1 cm.  There was mild mitral regurgitation with one moderate size broad jet and 2 small jets of MR.  The left atrium was dilated.  Diagnostic cardiac catheterization revealed single-vessel coronary artery disease with 80% stenosis in the mid right coronary artery.  There was mild nonobstructive disease in the left coronary system.  There was mild pulmonary hypertension.  By catheterization mean transvalvular gradient across the mitral valve was estimated 18.8 mmHg corresponding to mitral valve area estimated 1.02 cm.    CT angiogram of the chest, abdomen, and pelvis reveals some further progression in the patient's known underlying metastatic ovarian cancer with further interval enlargement of the largest mass in the pelvis and some lymphadenopathy in the mesentery.  Cardiac gated CT angiogram of the heart demonstrates left ventricular enlargement with what has been described as a "pseudoaneurysm" involving the inferior wall of the left ventricle and thin-walled aneurysmal appearance of the left ventricular apex.  The patient did not have significant bleeding following her emergency surgery in 2011 and in fact recovered remarkably uneventfully.  She was discharged from the hospital and only 7 days.  I suspect the radiographic appearance of the inferior wall left ventricle is likely related to how the large patch used to close the ventricular free wall healed rather than a pseudoaneurysm, but the presence of a pseudoaneurysm cannot be ruled out and we have no other imaging studies to suggest interval enlargement over time.   I would not consider this patient a candidate for conventional surgical redo mitral valve  replacement with or without redo repair of the patient's left ventricular free wall aneurysm/pseudoaneurysm.  I favor valve-in-valve transcatheter mitral valve replacement.  Options include either transseptal or  transapical approach.  The dilated and thin-walled appearance of the left ventricular apex could potentially be associated with some increased risk of bleeding or rupture.   Plan:  The patient was again counseled at length regarding treatment alternatives for management of prosthetic valve dysfunction with severe symptomatic mitral stenosis and mitral regurgitation.  Results of the patient's recent CT angiograms were discussed in detail including the concerns about possible pseudoaneurysm as well as further progression of the patient's underlying malignancy.  Alternative approaches such as conventional surgery, valve in valve transcatheter  mitral valve replacement, and continued medical therapy without intervention were compared and contrasted at length.  The risks associated with conventional surgical  mitral valve replacement with or without an attempt to repair the patient's left ventricular free wall aneurysm were discussed in detail including  why I would be reluctant to consider this patient a candidate for conventional surgery.  Issues specific to valve in valve transcatheter  mitral valve replacement were discussed including questions about long term valve durability, the potential for embolization or paravalvular leak, and other technical complications related to the procedure itself including need for closure of iatrogenic atrial septal defect and/or bleeding or rupture of the left ventricular apex.  Long-term prognosis with medical therapy was discussed. This discussion was placed in the context of the patient's own specific clinical presentation and past medical history.  All of her questions have been addressed.  The patient hopes to proceed with surgery as soon as possible.  She will be referred to Dr. Cyndia Bent for a second surgical opinion.  We tentatively plan to proceed with surgery on September 16, 2017   I spent in excess of 30 minutes during the conduct of this office consultation and >50% of  this time involved direct face-to-face encounter with the patient for counseling and/or coordination of their care.     Valentina Gu. Roxy Manns, MD 09/01/2017 10:34 AM

## 2017-09-01 NOTE — Patient Instructions (Signed)
Stop taking aspirin  Continue taking all other medications without change through the day before surgery.  Have nothing to eat or drink after midnight the night before surgery.  On the morning of surgery don't take any medications

## 2017-09-03 ENCOUNTER — Other Ambulatory Visit: Payer: Self-pay

## 2017-09-03 DIAGNOSIS — T8209XD Other mechanical complication of heart valve prosthesis, subsequent encounter: Secondary | ICD-10-CM

## 2017-09-04 ENCOUNTER — Other Ambulatory Visit: Payer: Self-pay

## 2017-09-04 ENCOUNTER — Institutional Professional Consult (permissible substitution) (INDEPENDENT_AMBULATORY_CARE_PROVIDER_SITE_OTHER): Payer: BLUE CROSS/BLUE SHIELD | Admitting: Surgery

## 2017-09-04 ENCOUNTER — Encounter: Payer: Self-pay | Admitting: Surgery

## 2017-09-04 VITALS — BP 101/68 | HR 92 | Resp 20 | Ht 66.0 in | Wt 198.2 lb

## 2017-09-04 DIAGNOSIS — T82857A Stenosis of cardiac prosthetic devices, implants and grafts, initial encounter: Secondary | ICD-10-CM | POA: Diagnosis not present

## 2017-09-04 NOTE — Progress Notes (Signed)
Patient ID: [name], female   DOB: Oct 28, 1960, 57 y.o.   MRN: [id number]  [facility] SURGERY CONSULTATION REPORT  Referring Provider is Belva Crome, MD PCP is Patient, No Pcp Per  Chief Complaint  Patient presents with  . Aortic Stenosis    2nd TMVR evaluation    HPI:  The patient is a 57 year old woman with a history of hypercholesterolemia, hypothyroidism, stage III chronic kidney disease, ongoing tobacco abuse, recurrent ovarian cancer with multiple intra-abdominal metastases treated with prior surgical resection in 2014 and 2015 with further slow progression, and coronary artery disease status post inferior MI in 2774 complicated by postinfarction papillary muscle rupture and left ventricular aneurysm who underwent urgent repair with mitral valve replacement using a bioprosthetic tissue valve and repair of the left ventricular aneurysm.  She has had ischemic cardiomyopathy, ventricular tachycardia status post ICD implantation, and chronic systolic congestive heart failure.  She said that she was on vacation at [facility] in April 2011 when she developed acute congestive heart failure and was diagnosed with severe acute mitral regurgitation secondary to postinfarction papillary muscle rupture.  She said that she had felt poorly 1-2 weeks prior to that but did not know that she had a myocardial infarction.  She was short of breath when she went into the hospital and surgery was planned for the following day but she became acutely short of breath overnight and had to have emergent repair using a 29 mm Edwards paramount bovine stented pericardial valve and repair of the aneurysm of the inferior posterior left ventricular wall.  She had some postoperative recurrent ventricular tachycardia and underwent implantation of an ICD but had a relatively smooth postop course considering and was home in about 7 days.  She is  here by herself today.  She said that she is single but lives with her ex-husband and Lowry Ram and works as an Glass blower/designer for [employer].  She reports long-standing symptoms of mild exertional shortness of breath and has not been very active.  In December 2018 she was hospitalized with pancolitis and had an acute exacerbation of shortness of breath.  A follow-up echocardiogram showed severe mitral stenosis with a mean transvalvular gradient of 21 mmHg across the bioprosthetic valve.  Left ventricular ejection fraction decreased to 35-40% with global hypokinesis and inferior wall akinesis.  She improved with medical therapy but is continued to have exertional shortness of breath with any significant activity.  She underwent cardiac catheterization on 07/02/2017 which showed severe prosthetic mitral stenosis with a mean transvalvular gradient estimated at 18.8 mmHg.  The mitral valve area was estimated at 1.02 cm.  There is mild pulmonary hypertension.  Ejection fraction was estimated at 30-35%.  There is an 80% stenosis of the mid right coronary artery with nonobstructive disease in the left circumflex and LAD.  Past Medical History:  Diagnosis Date  . Arthritis    "everywhere"  . Asthma    as a child  . Automatic implantable cardioverter-defibrillator in situ    DR. Beckie Salts   . Bladder cancer [facility]) 2009   "injected medicine to get rid of it"  . Chronic systolic CHF (congestive heart failure) ([place])   . CKD (chronic kidney disease), stage III ([place])   . Coronary artery disease    a. large RV/inferior MI complicated by pseudoaneurysm s/p aneurysemectomy and CABG 10/2009 with papillary muscle rupture requiring bioprosthetic mitral valve replacement in [place].  . 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 H/O thymectomy   . High cholesterol   . Hypothyroidism   . ICD (implantable cardiac defibrillator) in place   .  Ischemic cardiomyopathy    a.  ICM and VT arrest 10/2009 s/p AICD (revision 01/2012 due to RV lead problem).  . Myocardial infarction ([place]) 10/14/2009  . Neuropathy    FEET AND HANDS - FROM CHEMO - BUT MUCH IMPROVED  . PAF (paroxysmal atrial fibrillation) ([place])   . Pain    JOINT PAINS AND MUSCLE ACHES ALL OVER.  Marland Kitchen Port-A-Cath in place    RIGHT UPPER CHEST  . Prosthetic valve dysfunction   . S/P mitral valve replacement with bioprosthetic valve 10/21/2009   Edwards Perimount stented bovine pericardial tissue valve, size 29 mm  . SVT (supraventricular tachycardia) ([name])   . Tobacco abuse   . Ventricular tachycardia [facility])     Past Surgical History:  Procedure Laterality Date  . ABDOMINAL HYSTERECTOMY N/A 07/27/2013   Procedure: TUMOR EXCISION OF ABDOMINAL MASS;  Surgeon: Alvino Chapel, MD;  Location: [facility] ORS;  Service: Gynecology;  Laterality: N/A;  . APPENDECTOMY  1989  . BILATERAL OOPHORECTOMY  2007  . CARDIAC DEFIBRILLATOR PLACEMENT  02/06/2012   "lead change"  . CARDIAC VALVE REPLACEMENT    . CYSTOSCOPY Right 02/23/2013   Procedure: CYSTOSCOPY WITH STENT REMOVAL;  Surgeon: Alvino Chapel, MD;  Location: [facility];  Service: Gynecology;  Laterality: Right;  . IMPLANTABLE CARDIOVERTER DEFIBRILLATOR REVISION N/A 02/06/2012   Procedure: IMPLANTABLE CARDIOVERTER DEFIBRILLATOR REVISION;  Surgeon: Evans Lance, MD;  Location: [facility] CATH LAB;  Service: Cardiovascular;  Laterality: N/A;  . INSERT / REPLACE / REMOVE PACEMAKER  10/2009   DUAL-CHAMBER St. JUDE DEFIBRILLATOR  . LAPAROTOMY N/A 01/19/2013   Procedure: TUMOR DEBULKING / BOWEL RESECTION/INSERTION RIGHT UTERERAL DOUBLE J STENT/OMENTECTOMY;  Surgeon: Alvino Chapel, MD;  Location: [facility];  Service: Gynecology;  Laterality: N/A;  . MITRAL VALVE REPLACEMENT  10/21/2009   39mm Edwards Perimount bovine pericardial tissue valve - [facility]  . RIGHT/LEFT HEART CATH AND CORONARY ANGIOGRAPHY N/A 07/02/2017   Procedure:  RIGHT/LEFT HEART CATH AND CORONARY ANGIOGRAPHY;  Surgeon: Belva Crome, MD;  Location: [place] CV LAB;  Service: Cardiovascular;  Laterality: N/A;  . THYMECTOMY  2009  . TONSILLECTOMY AND ADENOIDECTOMY  ~ 1967  . TOTAL HIP ARTHROPLASTY  05/06/2012   Procedure: TOTAL HIP ARTHROPLASTY;  Surgeon: Gearlean Alf, MD;  Location: [facility];  Service: Orthopedics;  Laterality: Right;  . TRANSURETHRAL RESECTION OF BLADDER  2009   "for bladder cancer"  . TUBAL LIGATION  1993  . VAGINAL HYSTERECTOMY  2001    Family History  Problem Relation Age of Onset  . Heart attack Paternal Grandfather   . Heart attack Maternal Grandfather   . Stroke Maternal Grandmother   . Heart attack Brother     Social History   Socioeconomic History  . Marital status: Divorced    Spouse name: Not on file  . Number of children: Not on file  . Years of education: Not on file  . Highest education level: Not on file  Social Needs  . Financial resource strain: Not on file  . Food insecurity - worry: Not on file  . Food insecurity - inability: Not on file  . Transportation needs - medical: Not on file  . Transportation needs - non-medical: Not  on file  Occupational History    Comment: WORKS FULL TIME  Tobacco Use  . Smoking status: Current Some Day Smoker    Packs/day: 0.50    Years: 30.00    Pack years: 15.00    Types: Cigarettes    Start date: 03/28/1974  . Smokeless tobacco: Never Used  Substance and Sexual Activity  . Alcohol use: No    Alcohol/week: 0.0 oz  . Drug use: No  . Sexual activity: Not on file  Other Topics Concern  . Not on file  Social History Narrative   WORKS FULL TIME   SINGLE   TOBACCO USE-YES   IMPLANTATION OF DUAL-CHAMBER St. JUDE DEFIBRILLATOR    Current Outpatient Medications  Medication Sig Dispense Refill  . albuterol (PROVENTIL HFA;VENTOLIN HFA) 108 (90 Base) MCG/ACT inhaler Inhale 2 puffs into the lungs every 4 (four) hours as needed for wheezing or shortness of  breath. 1 Inhaler 1  . aspirin 325 MG EC tablet Take 325 mg by mouth daily.     Marland Kitchen b complex vitamins tablet Take 1 tablet by mouth 2 (two) times daily.     . carvedilol (COREG) 3.125 MG tablet Take 1 tablet (3.125 mg total) by mouth 2 (two) times daily with a meal. 180 tablet 3  . cholecalciferol (VITAMIN D-400) 400 UNITS TABS tablet Take 400 Units by mouth 2 (two) times daily.    . diphenhydrAMINE (BENADRYL) 25 MG tablet Take 25 mg by mouth every 6 (six) hours as needed for itching or allergies.     . Diphenhydramine-APAP (PERCOGESIC EXTRA STRENGTH) 12.5-500 MG TABS Take 2 tablets by mouth 2 (two) times daily as needed (for pain.). PERCOGESIC    . furosemide (LASIX) 20 MG tablet Take 20 mg by mouth daily.    Marland Kitchen LORazepam (ATIVAN) 0.5 MG tablet TAKE 1 TABLET BY MOUTH EVERY 6 HOURS AS NEEDED FOR NAUSEA AND VOMITING (Patient taking differently: TAKE 0.5 MG BY MOUTH EVERY 6 HOURS AS NEEDED FOR NAUSEA AND VOMITING) 60 tablet 2  . Multiple Vitamin (MULTIVITAMIN WITH MINERALS) TABS tablet Take 1 tablet by mouth daily.    Marland Kitchen spironolactone (ALDACTONE) 25 MG tablet Take 1 tablet (25 mg total) by mouth daily. 90 tablet 3  . Vitamin D, Ergocalciferol, (DRISDOL) 50000 units CAPS capsule TAKE 1 CAPSULE (50,000 UNITS TOTAL) BY MOUTH EVERY 7 (SEVEN) DAYS. (Patient taking differently: Take 50,000 Units by mouth every Thursday. In the morning) 24 capsule 2  . zolpidem (AMBIEN) 5 MG tablet TAKE 1 TABLET BY MOUTH AT BEDTIME 30 tablet 2   No current facility-administered medications for this visit.     Allergies  Allergen Reactions  . Prochlorperazine Edisylate Other (See Comments)    Nervous/ flutter/ shakes--compazine  . Adhesive [Tape] Other (See Comments)    Redness/skin peeling Redness/hives from adhesive tape( tolerates latex gloves); tolerates paper tape  . Prochlorperazine Other (See Comments)    Jitters, hyper      Review of Systems:   General:  normal appetite, decreased energy, no weight gain,  + weight loss, no fever  Cardiac:  no chest pain with exertion, no chest pain at rest, +SOB with mild exertion, no resting SOB, no PND, no orthopnea, no palpitations, no arrhythmia, no atrial fibrillation, no LE edema, no dizzy spells, no syncope  Respiratory:  + shortness of breath, no home oxygen, no productive cough, no dry cough, no bronchitis, + wheezing, no hemoptysis, no asthma, no pain with inspiration or cough, no sleep apnea, no CPAP at  night  GI:   no difficulty swallowing, no reflux, no frequent heartburn, no hiatal hernia, no abdominal pain, no constipation, no diarrhea, no hematochezia, no hematemesis, no melena  GU:   no dysuria,  no frequency, no urinary tract infection, no hematuria,  no kidney stones, chronic kidney disease  Vascular:  no pain suggestive of claudication, no pain in feet, no leg cramps, no varicose veins, no DVT, no non-healing foot ulcer  Neuro:   no stroke, no TIA's, no seizures, no headaches, no temporary blindness one eye,  no slurred speech, no peripheral neuropathy, + chronic pain, no instability of gait, no memory/cognitive dysfunction  Musculoskeletal: + arthritis, + joint swelling, + myalgias, some difficulty walking, decreased mobility   Skin:   no rash, no itching, no skin infections, no pressure sores or ulcerations  Psych:   no anxiety, no depression, no nervousness, no unusual recent stress  Eyes:   no blurry vision, + floaters, no recent vision changes,  wears glasses or contacts  ENT:   no hearing loss, no loose or painful teeth, no dentures, last saw dentist 05/2017  Hematologic:  no easy bruising, no abnormal bleeding, no clotting disorder, no frequent epistaxis  Endocrine:  no diabetes, does not check CBG's at home     Physical Exam:   BP 101/68 (BP Location: Left Arm, Patient Position: Sitting, Cuff Size: Large)   Pulse 92   Resp 20   Ht 5\' 6"  (1.676 m)   Wt 198 lb 3.2 oz (89.9 kg)   SpO2 98% Comment: RA  BMI 31.99 kg/m    General:  well-appearing  HEENT:  Unremarkable, NCAT, PERLA, EOMI, oropharynx clear  Neck:   no JVD, no bruits, no adenopathy or thyromegaly  Chest:   clear to auscultation, symmetrical breath sounds, no wheezes, no rhonchi   CV:   RRR, no murmur  Abdomen:  soft, non-tender, no masses or organomegaly  Extremities:  warm, well-perfused, pulses palpable in feet, no LE edema  Rectal/GU  Deferred  Neuro:   Grossly non-focal and symmetrical throughout  Skin:   Clean and dry, no rashes, no breakdown   Diagnostic Tests:                              *[place]*                  *[facility]*                          [address]                        [place], [place] [address]                            [phone]  ------------------------------------------------------------------- Transthoracic Echocardiography  Patient:    Xoe, Hoe MR #:       [id number] Study Date: 06/04/2017 Gender:     F Age:        55 Height:     167.6 cm Weight:     91.2 kg BSA:        2.09 m^2 Pt. Status: Room:       [place], Arshad N  REFERRING    [place],  Arshad N  ADMITTING    Thurnell Lose  PERFORMING   [facility], Inpatient  SONOGRAPHER  Darlina Sicilian, RDCS  cc:  ------------------------------------------------------------------- LV EF: 35% -   40%  ------------------------------------------------------------------- Indications:      Dyspnea 786.09.  ------------------------------------------------------------------- History:   PMH:  Cardiomyopathy Ischemic.  Atrial fibrillation. Coronary artery disease.  Congestive heart failure.  PMH: Myocardial infarction.  Risk factors:  Dyslipidemia.  ------------------------------------------------------------------- Study Conclusions  - Left ventricle: The cavity size was normal. Wall thickness was   normal. Systolic function was moderately  reduced. The estimated   ejection fraction was in the range of 35% to 40%. Global   hypokinesis with basal to mid inferior akinesis. The study is not   technically sufficient to allow evaluation of LV diastolic   function. - Aortic valve: Sclerosis without stenosis. There was no   regurgitation. - Mitral valve: Bioprosthetic valve. Leaflets appear calcified with   minimal excursion. There is an increased mean transmitral   gradient of 21 mmHg and pressure 1/2t of 200 msec, suggestive of   severe mitral stenosis. Calculated AVA of 1.1 cm2 at a HR of 95.   There was mild regurgitation. Valve area by pressure half-time:   1.11 cm^2. - Left atrium: Moderately dilated. - Right ventricle: The cavity size was mildly dilated. Mildly   reduced RV systolic function. AICD wire noted in right ventricle. - Right atrium: The atrium was normal in size. AICD wire noted in   right atrium. - Tricuspid valve: There was moderate regurgitation. - Pulmonary arteries: PA peak pressure: 65 mm Hg (S). - Inferior vena cava: The vessel was dilated. The respirophasic   diameter changes were blunted (< 50%), consistent with elevated   central venous pressure.  Impressions:  - Compared to a prior study in 2016, the LVEF is lower at 35-40%   with global hypokinesis and inferior akinesis. AICD wires are   present. There is a degenerate bioprosthetic mitral valve with   severe stenosis and mild regurgitation. The leafelts are   calcified and poorly mobile. Mean gradient of 21 mmHg (up from 16   mmHg) with calculated AVA of around 1.1 cm2 at HR of 95 bpm,   moderate LAE and moderate pulmonary hypertension with RVSP of 65   mmHg is noted.  ------------------------------------------------------------------- Labs, prior tests, procedures, and surgery: ACID.  Coronary artery bypass grafting (2011).     Mitral valve replacement with a bioprosthetic valve. Unknown type and  size.  ------------------------------------------------------------------- Study data:  Comparison was made to the study of 06/01/2015.  Study status:  Routine.  Procedure:  The patient reported no pain pre or post test. Transthoracic echocardiography. Image quality was adequate.  Study completion:  There were no complications. Transthoracic echocardiography.  M-mode, complete 2D, spectral Doppler, and color Doppler.  Birthdate:  Patient birthdate: 26-Aug-1960.  Age:  Patient is 57 yr old.  Sex:  Gender: female. BMI: 32.5 kg/m^2.  Blood pressure:     98/70  Patient status: Inpatient.  Study date:  Study date: 06/04/2017. Study time: 08:58 AM.  Location:  Bedside.  -------------------------------------------------------------------  ------------------------------------------------------------------- Left ventricle:  The cavity size was normal. Wall thickness was normal. Systolic function was moderately reduced. The estimated ejection fraction was in the range of 35% to 40%. Global hypokinesis with basal to mid inferior akinesis. The study is not technically sufficient to allow evaluation of LV diastolic function.  ------------------------------------------------------------------- Aortic valve:  Sclerosis without stenosis.  Doppler:  There was no regurgitation.  -------------------------------------------------------------------  Aorta:  Aortic root: The aortic root was normal in size. Ascending aorta: The ascending aorta was normal in size.  ------------------------------------------------------------------- Mitral valve:  Bioprosthetic valve. Leaflets appear calcified with minimal excursion. There is an increased mean transmitral gradient of 21 mmHg and pressure 1/2t of 200 msec, suggestive of severe mitral stenosis. Calculated AVA of 1.1 cm2 at a HR of 95.  Doppler:  There was mild regurgitation.    Valve area by pressure half-time: 1.11 cm^2. Indexed valve area by  pressure half-time: 0.53 cm^2/m^2. Indexed valve area by continuity equation (using LVOT flow): 0.23 cm^2/m^2.    Mean gradient (D): 21 mm Hg. Peak gradient (D): 29 mm Hg.  ------------------------------------------------------------------- Left atrium:  Moderately dilated.  ------------------------------------------------------------------- Atrial septum:  Poorly visualized.  ------------------------------------------------------------------- Right ventricle:  The cavity size was mildly dilated. Mildly reduced RV systolic function. AICD wire noted in right ventricle.   ------------------------------------------------------------------- Pulmonic valve:    The valve appears to be grossly normal. Doppler:  There was trivial regurgitation.  ------------------------------------------------------------------- Tricuspid valve:   Doppler:  There was moderate regurgitation.  ------------------------------------------------------------------- Pulmonary artery:   The main pulmonary artery was normal-sized.  ------------------------------------------------------------------- Right atrium:  The atrium was normal in size. AICD wire noted in right atrium.  ------------------------------------------------------------------- Pericardium:  There was no pericardial effusion.  ------------------------------------------------------------------- Systemic veins: Inferior vena cava: The vessel was dilated. The respirophasic diameter changes were blunted (< 50%), consistent with elevated central venous pressure.  ------------------------------------------------------------------- Measurements   Left ventricle                           Value          Reference  LV ID, ED, PLAX chordal          (H)     53.2  mm       43 - 52  LV ID, ES, PLAX chordal          (H)     48    mm       23 - 38  LV fx shortening, PLAX chordal   (L)     10    %        >=29  LV PW thickness, ED                       8.3   mm       ----------  IVS/LV PW ratio, ED                      1.02           <=1.3  Stroke volume, 2D                        38    ml       ----------  Stroke volume/bsa, 2D                    18    ml/m^2   ----------  LV ejection fraction, 1-p A4C            45    %        ----------  LV end-diastolic volume, 2-p             168   ml       ----------  LV end-systolic volume, 2-p  104   ml       ----------  LV ejection fraction, 2-p                38    %        ----------  Stroke volume, 2-p                       64    ml       ----------  LV end-diastolic volume/bsa, 2-p         80    ml/m^2   ----------  LV end-systolic volume/bsa, 2-p          50    ml/m^2   ----------  Stroke volume/bsa, 2-p                   30.6  ml/m^2   ----------    Ventricular septum                       Value          Reference  IVS thickness, ED                        8.49  mm       ----------    LVOT                                     Value          Reference  LVOT ID, S                               16    mm       ----------  LVOT area                                2.01  cm^2     ----------  LVOT peak velocity, S                    108   cm/s     ----------  LVOT mean velocity, S                    70.1  cm/s     ----------  LVOT VTI, S                              18.7  cm       ----------    Aorta                                    Value          Reference  Aortic root ID, ED                       26    mm       ----------    Left atrium                              Value  Reference  LA ID, A-P, ES                           51    mm       ----------  LA ID/bsa, A-P                   (H)     2.44  cm/m^2   <=2.2  LA volume, S                             84.5  ml       ----------  LA volume/bsa, S                         40.4  ml/m^2   ----------  LA volume, ES, 1-p A4C                   82.8  ml       ----------  LA volume/bsa, ES, 1-p A4C               39.6  ml/m^2    ----------  LA volume, ES, 1-p A2C                   86.2  ml       ----------  LA volume/bsa, ES, 1-p A2C               41.2  ml/m^2   ----------    Mitral valve                             Value          Reference  Mitral E-wave peak velocity              267   cm/s     ----------  Mitral A-wave peak velocity              229   cm/s     ----------  Mitral mean velocity, D                  220   cm/s     ----------  Mitral deceleration time         (H)     588   ms       150 - 230  Mitral pressure half-time                199   ms       ----------  Mitral mean gradient, D                  21    mm Hg    ----------  Mitral peak gradient, D                  29    mm Hg    ----------  Mitral E/A ratio, peak                   1.2            ----------  Mitral valve area, PHT, DP               1.11  cm^2     ----------  Mitral valve area/bsa, PHT, DP  0.53  cm^2/m^2 ----------  Mitral valve area/bsa, LVOT              0.23  cm^2/m^2 ----------  continuity  Mitral annulus VTI, D                    79.1  cm       ----------  Mitral regurg VTI, PISA                  96.8  cm       ----------    Pulmonary arteries                       Value          Reference  PA pressure, S, DP               (H)     65    mm Hg    <=30    Tricuspid valve                          Value          Reference  Tricuspid regurg peak velocity           354   cm/s     ----------  Tricuspid peak RV-RA gradient            50    mm Hg    ----------    Right atrium                             Value          Reference  RA ID, S-I, ES, A4C                      46.9  mm       34 - 49  RA area, ES, A4C                         12.5  cm^2     8.3 - 19.5  RA volume, ES, A/L                       27.3  ml       ----------  RA volume/bsa, ES, A/L                   13    ml/m^2   ----------    Systemic veins                           Value          Reference  Estimated CVP                            3     mm Hg     ----------    Right ventricle                          Value          Reference  TAPSE  16.5  mm       ----------  RV pressure, S, DP               (H)     53    mm Hg    <=30  RV s&', lateral, S                        9.79  cm/s     ----------  Legend: (L)  and  (H)  mark values outside specified reference range.  ------------------------------------------------------------------- Prepared and Electronically Authenticated by  Lyman Bishop MD 2018-12-12T10:16:10   Physicians   Panel Physicians Referring Physician Case Authorizing Physician  Belva Crome, MD (Primary)    Procedures   RIGHT/LEFT HEART CATH AND CORONARY ANGIOGRAPHY  Conclusion    Severe mitral prosthesis stenosis with calculated valve area of 1.02 cm square.  This is based upon a mean gradient across the mitral valve of 18.8 mmHg.  No mitral regurgitation is noted by left ventriculography.  Mild pulmonary hypertension with mean pressure 30 mmHg (42/19 mmHg).  Inferobasal dyskinesis with akinesis of the mid and apical inferior wall.  EF 30-35%.  Normal anterior wall motion.  Normal LVEDP.  Widely patent left main, LAD, and circumflex coronary artery.  The second obtuse marginal contains eccentric proximal 50% narrowing.  Dominant right coronary with mid vessel 80% segmental stenosis with appearance of dissection representing recanalized total occlusion from the culprit event in 2011.  The patient is not having angina and the inferior wall is akinetic.  No indication for PCI in absence of ischemic symptoms.  RECOMMENDATIONS:   Continue medical therapy for LV systolic dysfunction  Refer for surgical opinion concerning valve replacement.  Balloon valvotomy in the setting of bioprosthetic valve is not an option due to high risk of valve leaflet tearing and severe mitral regurgitation.  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 Intravenous sedation with Versed and fentanyl was administered. 1% Xylocaine was infiltrated to achieve local analgesia. Using real-time vascular ultrasound, a double wall stick with an angiocath was utilized to obtain intra-arterial access. A VUS image was saved for the permanent record.The modified Seldinger technique was used to place a 71F " Slender" sheath in the right radial artery. Weight based heparin was administered. Coronary angiography was done using 5 F catheters. Right and left coronary angiography was performed with a JR4 catheter. Left ventricular hemodynamic recordings were performed using the JR 4 catheter.  Right heart catheterization was performed by exchanging a previously placed antecubital IV angio-cath for a 5 French Slender sheath. 1% Xylocaine was used to locally anesthetize the area around the IV site. The IV catheter was wired using an .018 guidewire. The modified Seldinger technique was used to place the 5 [place] sheath. The sheath would not advance completely. Double glove technique was used to enhance sterility. After sheath insertion, a .018 steerable Glidewire was then advanced into the right atrium. The 71F Swan-Ganz catheter was then advanced over the guidewire. Right heart cath was performed using the 5 French balloon tipped catheter and fluoroscopic guidance. Pressures were recorded in each chamber and in the pulmonary capillary wedge position.. The main pulmonary artery O2 saturation was sampled. Simultaneous  wedge LV recording was also performed.  Hemostasis was achieved using a pneumatic band at the radial access site and manual compression in the antecubital region.  During this  procedure the patient is administered a total of Versed 2 mg and Fentanyl 50 mg to achieve and maintain moderate conscious sedation. The patient's heart rate, blood pressure, and oxygen saturation are monitored continuously during the procedure. The period of conscious sedation is 55 minutes, of which I was present face-to-face 100% of this time.   Estimated blood loss <50 mL.  During this procedure the patient was administered the following to achieve and maintain moderate conscious sedation: Versed 2 mg, Fentanyl 50 mcg, while the patient's heart rate, blood pressure, and oxygen saturation were continuously monitored. The period of conscious sedation was 50 minutes, of which I was present face-to-face 100% of this time.  Coronary Findings   Diagnostic  Dominance: Right  Left Circumflex  First Obtuse Marginal Branch  Vessel is small in size.  Second Obtuse Marginal Branch  Ost 2nd Mrg lesion 50% stenosed  Ost 2nd Mrg lesion is 50% stenosed.  Right Coronary Artery  Mid RCA lesion 80% stenosed  Mid RCA lesion is 80% stenosed.  First Right Posterolateral  Vessel is small in size.  Intervention   No interventions have been documented.  Right Heart   Right Heart Pressures Hemodynamic findings consistent with mild pulmonary hypertension. LV EDP is normal.  Wall Motion   Resting       The mid anterior, basilar anterior and apical anterior segments are normal. The mid inferior segment is akinetic. The basilar inferior and apical inferior segments are dyskinetic.           Left Heart   Left Ventricle The left ventricle is moderately dilated. There is moderate to severe left ventricular systolic dysfunction. LV end diastolic pressure is normal. The left ventricular ejection fraction is 35-45% by visual estimate. There are LV function abnormalities due to segmental dysfunction. There is no evidence of mitral regurgitation.  Coronary Diagrams   Diagnostic Diagram       Implants      No implant documentation for this case.  MERGE Images   Show images for CARDIAC CATHETERIZATION   Link to Procedure Log   Procedure Log    Hemo Data    Most Recent Value  Fick Cardiac Output 5.09 L/min  Fick Cardiac Output Index 2.55 (L/min)/BSA  RA A Wave 9 mmHg  RA V Wave 15 mmHg  RA Mean 9 mmHg  RV Systolic Pressure 45 mmHg  RV Diastolic Pressure 5 mmHg  RV EDP 10 mmHg  PA Systolic Pressure 42 mmHg  PA Diastolic Pressure 19 mmHg  PA Mean 30 mmHg  PW A Wave 37 mmHg  PW V Wave 42 mmHg  PW Mean 35 mmHg  AO Systolic Pressure 109 mmHg  AO Diastolic Pressure 67 mmHg  AO Mean 87 mmHg  LV Systolic Pressure 604 mmHg  LV Diastolic Pressure 6 mmHg  LV EDP 18 mmHg  Arterial Occlusion Pressure Extended Systolic Pressure 540 mmHg  Arterial Occlusion Pressure Extended Diastolic Pressure 61 mmHg  Arterial Occlusion Pressure Extended Mean Pressure 79 mmHg  Left Ventricular Apex Extended Systolic Pressure 981 mmHg  Left Ventricular Apex Extended Diastolic Pressure 9 mmHg  Left Ventricular Apex Extended EDP Pressure 20 mmHg  QP/QS 1  TPVR Index 11.79 HRUI  TSVR Index 31.84 HRUI  PVR SVR Ratio 0.11  TPVR/TSVR Ratio 0.37    ADDENDUM REPORT: 08/29/2017 13:45  CLINICAL DATA:  57 year old female with h/o inferior wall MI with inferior aneurysm, papillary muscle rupture, s/p aneurysm repair and MVR with a 29 mm Edwards-Perimount bovine stented pericardial tissue valve in 2011, now being evaluated for TMVR for bioprosthetic valve degenaration.  EXAM: Cardiac TAVR CT  TECHNIQUE: The patient was scanned on a [employer]. A 120 kV retrospective scan was triggered in the descending thoracic aorta at 111 HU's. Gantry rotation speed was 250 msecs and collimation was .6 mm. No beta blockade or nitro were given. The 3D data set was reconstructed in 5% intervals of the R-R cycle. Systolic and diastolic phases were analyzed on a dedicated work station using MPR, MIP  and VRT modes. The patient received 80 cc of contrast.  FINDINGS: Mitral valve: S/P MVR with a 29 mm Edwards-Perimount bovine stented pericardial tissue valve. Leaflets are thickened and have moderate calcifications. Leaflets have severely limited opening with MVA 1.01 cm2.  Prosthetic valve ring measurements:  Maximum/Minimum Diameter: 26.9 x 26.9 mm  Perimeter: 83.3 mm  Area: 544 mm2  Aortic Valve: Trileaflet, no calcifications, opens well.  Aorta: Normal size, no dissection, mild diffuse atherosclerotic plaque and calcifications in the aortic arch and descending thoracic aorta.  Sinotubular Junction: 25 x 25 mm  Ascending Thoracic Aorta: 34 x 32 mm  Aortic Arch: 28 x 24 mm  Descending Thoracic Aorta: 20 x 19 mm  Sinus of Valsalva Measurements:  Non-coronary: 31 mm  Right -coronary: 28 mm  Left -coronary: 29 mm  Coronary Arteries: Normal origin  No thrombus was seen in the left atrial appendage.  There is a large pseudoaneurysm in the basal and mid inferior wall measuring 55 x 30 mm.  Pulmonary artery is dilated measuring 33 x 30 mm.  PM leads are seen in the right atrium and right ventricle.  IMPRESSION: 1. S/P MVR with a 29 mm Edwards-Perimount bovine stented pericardial tissue valve. Leaflets are thickened and have moderate calcifications. Leaflets have severely limited opening with MVA 1.01 cm2. Bioprosthetic mitral valve ring measurements suitable for delivery of a 29 mm Edwards-SAPIEN 3 valve. This is in agreement with suggestions from the Valve in Valve for mitral valve app.  2.  No thrombus was seen in the left atrial appendage.  3. There is a large pseudoaneurysm in the basal and mid inferior wall measuring 55 x 30 mm.  4. Pulmonary artery is dilated measuring 33 x 30 mm suggestive of pulmonary hypertension.  5.  PM leads are seen in the right atrium and right ventricle.   Electronically Signed   By: Ena Dawley   On: 08/29/2017 13:45   Addended by Dorothy Spark, MD on 08/29/2017 1:47 PM    Study Result   EXAM: OVER-READ INTERPRETATION  CT CHEST  The following report is an over-read performed by radiologist Dr. Vinnie Langton of [facility], [place] on 08/27/2017. This over-read does not include interpretation of cardiac or coronary anatomy or pathology. The coronary CTA interpretation by the cardiologist is attached.  COMPARISON:  Chest CT 12/06/2016.  FINDINGS: Extracardiac findings will be described separately under dictation for contemporaneously obtained CTA chest, abdomen and pelvis.  IMPRESSION: 1. Please see separate dictation for contemporaneously obtained CTA chest, abdomen and pelvis 08/26/2017 for full description of relevant extracardiac findings.  Electronically Signed: By: Vinnie Langton M.D. On: 08/27/2017 09:39       CLINICAL DATA:  57 year old female with history of prosthetic mitral valve dysfunction. Preprocedural study prior to potential transcatheter mitral valve  replacement (TMVR) procedure.  EXAM: CT ANGIOGRAPHY CHEST, ABDOMEN AND PELVIS  TECHNIQUE: Multidetector CT imaging through the chest, abdomen and pelvis was performed using the standard protocol during bolus administration of intravenous contrast. Multiplanar reconstructed images and MIPs were obtained and reviewed to evaluate the vascular anatomy.  CONTRAST:  100 mL of Isovue 300  COMPARISON:  Chest CT 06/05/2017. CT the abdomen and pelvis 06/03/2017.  FINDINGS: CTA CHEST FINDINGS  Cardiovascular: Heart size is mildly enlarged with apical aneurysm of the left ventricle where there is profound myocardial thinning, indicative of prior distal LAD territory myocardial infarction. There is also profound myocardial thinning in the inferior wall segments from the mid ventricle to the base where there is pseudoaneurysm formation, presumably sequela of prior  infarction. There is no significant pericardial fluid, thickening or pericardial calcification. There is aortic atherosclerosis, as well as atherosclerosis of the great vessels of the mediastinum and the coronary arteries, including calcified atherosclerotic plaque in the left anterior descending coronary artery. Status post median sternotomy for mitral valve replacement with a stented bioprosthesis. Left-sided pacemaker/AICD in place with lead tips terminating in the right atrium and right ventricular apex.  Mediastinum/Lymph Nodes: 15 mm short axis low right paratracheal lymph node, stable compared to the prior study. No other lymphadenopathy noted elsewhere in the mediastinal or hilar nodal stations. Esophagus is unremarkable in appearance. No axillary lymphadenopathy  Lungs/Pleura: 1 cm ground-glass attenuation nodule near the apex of the right upper lobe (axial image 27 of series 15), similar to prior study from 06/05/2017. No confluent consolidative airspace disease. Trace bilateral pleural effusions. Patchy areas of mild ground-glass attenuation noted throughout the lung bases, similar to the prior study.  Musculoskeletal/Soft Tissues: Median sternotomy wires. There are no aggressive appearing lytic or blastic lesions noted in the visualized portions of the skeleton.  CTA ABDOMEN AND PELVIS FINDINGS  Hepatobiliary: 5 mm hypervascular lesion in segment 4A of the liver (image 98 of series 14), too small to characterize, but statistically likely a benign flash fill cavernous hemangioma or other perfusion anomaly. 8 mm low-attenuation lesion in segment 1 of the liver, decreased in size compared to prior examination 06/05/2017, presumably a partially involuted cyst. No other suspicious appearing hepatic lesions are noted. No intra or extrahepatic biliary ductal dilatation. Gallbladder is normal in appearance.  Pancreas: No pancreatic mass. No pancreatic ductal  dilatation. No pancreatic or peripancreatic fluid or inflammatory changes.  Spleen: Unremarkable.  Adrenals/Urinary Tract: Bilateral kidneys and bilateral adrenal glands are normal in appearance. No hydroureteronephrosis. Urinary bladder is normal in appearance.  Stomach/Bowel: Normal appearance of the stomach. No pathologic dilatation of small bowel or colon. Previously noted diffuse colonic wall thickening has resolved. The appendix is not confidently identified and may be surgically absent. Regardless, there are no inflammatory changes noted adjacent to the cecum to suggest the presence of an acute appendicitis at this time.  Vascular/Lymphatic: Aortic atherosclerosis with vascular findings and measurements pertinent to potential TAVR procedure, as detailed below. No aneurysm or dissection noted in the abdominal or pelvic vasculature. The celiac axis, superior mesenteric artery and inferior mesenteric artery are all widely patent without hemodynamically significant stenosis. Single renal arteries bilaterally are widely patent without hemodynamically significant stenosis. Multiple enlarging pelvic lymph nodes, largest of which is along the course of the inferior mesenteric artery measuring up to 2.2 cm in diameter (axial image 167 of series 14), compatible with nodal metastatic disease. Several additional lymph nodes are also noted in the internal iliac distributions, most evident in the  presacral space where the largest of these currently measures 14 mm in short axis (axial image 184 of series 14).  Reproductive: Status post total abdominal hysterectomy and bilateral salpingo-oophorectomy.  Other: Multiple large soft tissue masses in the anatomic pelvis, increased in size compared to the prior examination. The largest of these masses is in the central anatomic pelvis (axial image 186 of series 14), currently measuring 5.1 x 4.6 cm (previously 4.1 x 4.0 cm on  06/03/2017). Peritoneal implant in the right pericolic gutter (axial image 156 of series 14), similar to the prior study measuring 2.1 x 2.9 cm. No significant volume of ascites. No pneumoperitoneum.  Musculoskeletal: There are no aggressive appearing lytic or blastic lesions noted in the visualized portions of the skeleton. Status post right hip arthroplasty.  VASCULAR MEASUREMENTS PERTINENT TO TAVR:  AORTA:  Minimal Aortic Diameter-8 x 9 mm  Severity of Aortic Calcification-moderate  RIGHT PELVIS:  Right Common Iliac Artery -  Minimal Diameter-5.2 x 4.2 mm  Tortuosity-mild  Calcification-moderate  Right External Iliac Artery -  Minimal Diameter-5.8 x 5.9 mm  Tortuosity - mild  Calcification-none  Right Common Femoral Artery -  Minimal Diameter-5.5 x 4.7 mm  Tortuosity - mild  Calcification-mild  LEFT PELVIS:  Left Common Iliac Artery -  Minimal Diameter-6.5 x 4.6 mm  Tortuosity - mild  Calcification-moderate  Left External Iliac Artery -  Minimal Diameter-5.9 x 6.0 mm  Tortuosity - mild  Calcification-none  Left Common Femoral Artery -  Minimal Diameter-6.0 x 5.0 mm  Tortuosity - mild  Calcification-none  Review of the MIP images confirms the above findings.  IMPRESSION: 1. Vascular findings and measurements pertinent to potential TMVR procedure, as detailed above. 2. Status post median sternotomy for mitral valve replacement with a stented bioprosthesis noted. 3. Aneurysmal dilatation of the left ventricular apex, and profound myocardial thinning in pseudoaneurysm of the inferior wall the left ventricle from the mid ventricle to the base, indicative of prior myocardial infarctions. 4. Trace bilateral pleural effusions lying dependently. Progressive intraperitoneal metastatic disease in the low anatomic pelvis with developing lymph node metastases as well in the anatomic pelvis, as detailed above. 5.  Aortic atherosclerosis, in addition to left anterior descending coronary artery disease. Please note that although the presence of coronary artery calcium documents the presence of coronary artery disease, the severity of this disease and any potential stenosis cannot be assessed on this non-gated CT examination. Assessment for potential risk factor modification, dietary therapy or pharmacologic therapy may be warranted, if clinically indicated. 6. Additional incidental findings, as above.  Aortic Atherosclerosis (ICD10-I70.0).   Electronically Signed   By: Vinnie Langton M.D.   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  She now has [place] Heart Association class IIb symptoms of progressive exertional shortness of breath and fatigue consistent with chronic systolic and diastolic congestive heart failure.  I have personally reviewed her most recent transthoracic echo, cardiac catheterization, and CTA studies.  The echocardiogram shows moderate left ventricular systolic dysfunction with inferior wall akinesis and estimated ejection fraction of 35%.  The mitral bioprosthesis has thickened leaflets with restricted mobility and a mean transvalvular gradient of 21 mmHg corresponding to a calculated valve area of 1.1 cm.  There is mild mitral  regurgitation.  Cardiac catheterization shows an 80% stenosis in the mid right coronary artery with mild nonobstructive disease on the left.  The mean transvalvular gradient across the mitral valve was estimated at 18.8 mmHg.  I agree that mitral valve replacement is indicated in this patient with progressive symptoms and severe mitral stenosis.  She  would be at high operative risk for redo sternotomy and open surgical redo mitral valve replacement due to her prior surgery and underlying metastatic ovarian cancer with further enlargement of the largest mass in her pelvis.  I would not consider her a candidate for open surgical redo valve replacement.  I agree that transcatheter valve in valve mitral valve replacement is the best option for this patient.  Her gated cardiac CTA shows left ventricular enlargement with what is been described as a pseudoaneurysm involving the inferior wall of left ventricle with a thin-walled and aneurysmal appearing left ventricular apex.  I agree with Dr. Roxy Manns that most likely this appearance is due to the patch repair of her inferior posterior wall and not a pseudoaneurysm.  The options for transcatheter mitral valve and valve replacement include transapical insertion and transseptal insertion.  I think transseptal insertion is not ideal in this patient due to the previous surgery and altered anatomy with this left ventricular patch. Trans-apical insertion will be a more direct approach and although the apex appears somewhat thinned it may be tough scar.  Femoral vascular access should be maintained so that if the patient requires placement on temporary cardiopulmonary bypass to repair the apical insertion site it can be performed expeditiously.  The patient was counseled at length regarding treatment alternatives for management of severe symptomatic prosthetic mitral valve stenosis. The risks and benefits of surgical intervention has been discussed in detail. Long-term prognosis with medical therapy was discussed. Alternative approaches such as conventional surgical mitral valve replacement, transcatheter mitral valve in valve replacement, and palliative medical therapy were compared and contrasted at length. This discussion was placed in the context of the patient's own specific clinical presentation and past medical history.  All of her questions have been addressed. The patient is eager to proceed with surgical management as soon as possible.   Following the decision to proceed with transcatheter mitral valve in valve replacement, a discussion was held regarding what types of management strategies would be attempted intraoperatively in the event of life-threatening complications, including whether or not the patient would be considered a candidate for the use of cardiopulmonary bypass and/or conversion to open sternotomy for attempted surgical intervention. The patient is aware of the fact that transient use of cardiopulmonary bypass may be necessary, but I would not consider her a candidate for redo median sternotomy under any circumstances, even if she were to develop potentially lethal complications related to transcatheter valve replacement. The patient has been advised of a variety of complications that might develop including but not limited to risks of death, stroke, paravalvular leak, aortic dissection or other major vascular complications, aortic annulus rupture, device embolization, cardiac rupture or perforation, mitral regurgitation, acute myocardial infarction, arrhythmia, heart block or bradycardia requiring permanent pacemaker placement, congestive heart failure, respiratory failure, renal failure, pneumonia, infection, other late complications related to structural valve deterioration or migration, or other complications that might ultimately cause a temporary or permanent loss of functional independence or other long term morbidity. The patient provides full informed consent for the procedure as described and all questions were answered.   Plan:  The patient will be scheduled for transapical  valve and valve mitral replacement on 09/16/2017.   I spent 45 minutes performing this consultation and > 50% of this time was spent face to face counseling and coordinating the care of this patient's severe prosthetic  mitral valve stenosis.    Gaye Pollack, MD 09/04/2017

## 2017-09-04 NOTE — H&P (View-Only) (Signed)
Patient ID: [name], female   DOB: Jan 10, 1961, 57 y.o.   MRN: [id number]  [facility] REPORT  Referring Provider is Belva Crome, MD PCP is Patient, No Pcp Per  Chief Complaint  Patient presents with  . Aortic Stenosis    2nd TMVR evaluation    HPI:  The patient is a 57 year old woman with a history of hypercholesterolemia, hypothyroidism, stage III chronic kidney disease, ongoing tobacco abuse, recurrent ovarian cancer with multiple intra-abdominal metastases treated with prior surgical resection in 2014 and 2015 with further slow progression, and coronary artery disease status post inferior MI in 4656 complicated by postinfarction papillary muscle rupture and left ventricular aneurysm who underwent urgent repair with mitral valve replacement using a bioprosthetic tissue valve and repair of the left ventricular aneurysm.  She has had ischemic cardiomyopathy, ventricular tachycardia status post ICD implantation, and chronic systolic congestive heart failure.  She said that she was on vacation at [facility] in April 2011 when she developed acute congestive heart failure and was diagnosed with severe acute mitral regurgitation secondary to postinfarction papillary muscle rupture.  She said that she had felt poorly 1-2 weeks prior to that but did not know that she had a myocardial infarction.  She was short of breath when she went into the hospital and surgery was planned for the following day but she became acutely short of breath overnight and had to have emergent repair using a 29 mm Edwards paramount bovine stented pericardial valve and repair of the aneurysm of the inferior posterior left ventricular wall.  She had some postoperative recurrent ventricular tachycardia and underwent implantation of an ICD but had a relatively smooth postop course considering and was home in about 7 days.  She is  here by herself today.  She said that she is single but lives with her ex-husband and Lowry Ram and works as an Glass blower/designer for [employer].  She reports long-standing symptoms of mild exertional shortness of breath and has not been very active.  In December 2018 she was hospitalized with pancolitis and had an acute exacerbation of shortness of breath.  A follow-up echocardiogram showed severe mitral stenosis with a mean transvalvular gradient of 21 mmHg across the bioprosthetic valve.  Left ventricular ejection fraction decreased to 35-40% with global hypokinesis and inferior wall akinesis.  She improved with medical therapy but is continued to have exertional shortness of breath with any significant activity.  She underwent cardiac catheterization on 07/02/2017 which showed severe prosthetic mitral stenosis with a mean transvalvular gradient estimated at 18.8 mmHg.  The mitral valve area was estimated at 1.02 cm.  There is mild pulmonary hypertension.  Ejection fraction was estimated at 30-35%.  There is an 80% stenosis of the mid right coronary artery with nonobstructive disease in the left circumflex and LAD.  Past Medical History:  Diagnosis Date  . Arthritis    "everywhere"  . Asthma    as a child  . Automatic implantable cardioverter-defibrillator in situ    DR. Beckie Salts   . Bladder cancer [facility]) 2009   "injected medicine to get rid of it"  . Chronic systolic CHF (congestive heart failure) ([place])   . CKD (chronic kidney disease), stage III ([place])   . Coronary artery disease    a. large RV/inferior MI complicated by pseudoaneurysm s/p aneurysemectomy and CABG 10/2009 with papillary muscle rupture requiring bioprosthetic mitral valve replacement [place].  . 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 H/O thymectomy   . High cholesterol   . Hypothyroidism   . ICD (implantable cardiac defibrillator) in place   .  Ischemic cardiomyopathy    a.  ICM and VT arrest 10/2009 s/p AICD (revision 01/2012 due to RV lead problem).  . Myocardial infarction ([place]) 10/14/2009  . Neuropathy    FEET AND HANDS - FROM CHEMO - BUT MUCH IMPROVED  . PAF (paroxysmal atrial fibrillation) ([place])   . Pain    JOINT PAINS AND MUSCLE ACHES ALL OVER.  Marland Kitchen Port-A-Cath in place    RIGHT UPPER CHEST  . Prosthetic valve dysfunction   . S/P mitral valve replacement with bioprosthetic valve 10/21/2009   Edwards Perimount stented bovine pericardial tissue valve, size 29 mm  . SVT (supraventricular tachycardia) ([place])   . Tobacco abuse   . Ventricular tachycardia [facility])     Past Surgical History:  Procedure Laterality Date  . ABDOMINAL HYSTERECTOMY N/A 07/27/2013   Procedure: TUMOR EXCISION OF ABDOMINAL MASS;  Surgeon: Alvino Chapel, MD;  Location: [facility];  Service: Gynecology;  Laterality: N/A;  . APPENDECTOMY  1989  . BILATERAL OOPHORECTOMY  2007  . CARDIAC DEFIBRILLATOR PLACEMENT  02/06/2012   "lead change"  . CARDIAC VALVE REPLACEMENT    . CYSTOSCOPY Right 02/23/2013   Procedure: CYSTOSCOPY WITH STENT REMOVAL;  Surgeon: Alvino Chapel, MD;  Location: [facility];  Service: Gynecology;  Laterality: Right;  . IMPLANTABLE CARDIOVERTER DEFIBRILLATOR REVISION N/A 02/06/2012   Procedure: IMPLANTABLE CARDIOVERTER DEFIBRILLATOR REVISION;  Surgeon: Evans Lance, MD;  Location: [facility] CATH LAB;  Service: Cardiovascular;  Laterality: N/A;  . INSERT / REPLACE / REMOVE PACEMAKER  10/2009   DUAL-CHAMBER St. JUDE DEFIBRILLATOR  . LAPAROTOMY N/A 01/19/2013   Procedure: TUMOR DEBULKING / BOWEL RESECTION/INSERTION RIGHT UTERERAL DOUBLE J STENT/OMENTECTOMY;  Surgeon: Alvino Chapel, MD;  Location: [facility];  Service: Gynecology;  Laterality: N/A;  . MITRAL VALVE REPLACEMENT  10/21/2009   1mm Edwards Perimount bovine pericardial tissue valve - [facility]  . RIGHT/LEFT HEART CATH AND CORONARY ANGIOGRAPHY N/A 07/02/2017   Procedure:  RIGHT/LEFT HEART CATH AND CORONARY ANGIOGRAPHY;  Surgeon: Belva Crome, MD;  Location: [place] [facility] CV LAB;  Service: Cardiovascular;  Laterality: N/A;  . THYMECTOMY  2009  . TONSILLECTOMY AND ADENOIDECTOMY  ~ 1967  . TOTAL HIP ARTHROPLASTY  05/06/2012   Procedure: TOTAL HIP ARTHROPLASTY;  Surgeon: Gearlean Alf, MD;  Location: [facility];  Service: Orthopedics;  Laterality: Right;  . TRANSURETHRAL RESECTION OF BLADDER  2009   "for bladder cancer"  . TUBAL LIGATION  1993  . VAGINAL HYSTERECTOMY  2001    Family History  Problem Relation Age of Onset  . Heart attack Paternal Grandfather   . Heart attack Maternal Grandfather   . Stroke Maternal Grandmother   . Heart attack Brother     Social History   Socioeconomic History  . Marital status: Divorced    Spouse name: Not on file  . Number of children: Not on file  . Years of education: Not on file  . Highest education level: Not on file  Social Needs  . Financial resource strain: Not on file  . Food insecurity - worry: Not on file  . Food insecurity - inability: Not on file  . Transportation needs - medical: Not on file  . Transportation needs - non-medical: Not  on file  Occupational History    Comment: WORKS FULL TIME  Tobacco Use  . Smoking status: Current Some Day Smoker    Packs/day: 0.50    Years: 30.00    Pack years: 15.00    Types: Cigarettes    Start date: 03/28/1974  . Smokeless tobacco: Never Used  Substance and Sexual Activity  . Alcohol use: No    Alcohol/week: 0.0 oz  . Drug use: No  . Sexual activity: Not on file  Other Topics Concern  . Not on file  Social History Narrative   WORKS FULL TIME   SINGLE   TOBACCO USE-YES   IMPLANTATION OF DUAL-CHAMBER St. JUDE DEFIBRILLATOR    Current Outpatient Medications  Medication Sig Dispense Refill  . albuterol (PROVENTIL HFA;VENTOLIN HFA) 108 (90 Base) MCG/ACT inhaler Inhale 2 puffs into the lungs every 4 (four) hours as needed for wheezing or shortness of  breath. 1 Inhaler 1  . aspirin 325 MG EC tablet Take 325 mg by mouth daily.     Marland Kitchen b complex vitamins tablet Take 1 tablet by mouth 2 (two) times daily.     . carvedilol (COREG) 3.125 MG tablet Take 1 tablet (3.125 mg total) by mouth 2 (two) times daily with a meal. 180 tablet 3  . cholecalciferol (VITAMIN D-400) 400 UNITS TABS tablet Take 400 Units by mouth 2 (two) times daily.    . diphenhydrAMINE (BENADRYL) 25 MG tablet Take 25 mg by mouth every 6 (six) hours as needed for itching or allergies.     . Diphenhydramine-APAP (PERCOGESIC EXTRA STRENGTH) 12.5-500 MG TABS Take 2 tablets by mouth 2 (two) times daily as needed (for pain.). PERCOGESIC    . furosemide (LASIX) 20 MG tablet Take 20 mg by mouth daily.    Marland Kitchen LORazepam (ATIVAN) 0.5 MG tablet TAKE 1 TABLET BY MOUTH EVERY 6 HOURS AS NEEDED FOR NAUSEA AND VOMITING (Patient taking differently: TAKE 0.5 MG BY MOUTH EVERY 6 HOURS AS NEEDED FOR NAUSEA AND VOMITING) 60 tablet 2  . Multiple Vitamin (MULTIVITAMIN WITH MINERALS) TABS tablet Take 1 tablet by mouth daily.    Marland Kitchen spironolactone (ALDACTONE) 25 MG tablet Take 1 tablet (25 mg total) by mouth daily. 90 tablet 3  . Vitamin D, Ergocalciferol, (DRISDOL) 50000 units CAPS capsule TAKE 1 CAPSULE (50,000 UNITS TOTAL) BY MOUTH EVERY 7 (SEVEN) DAYS. (Patient taking differently: Take 50,000 Units by mouth every Thursday. In the morning) 24 capsule 2  . zolpidem (AMBIEN) 5 MG tablet TAKE 1 TABLET BY MOUTH AT BEDTIME 30 tablet 2   No current facility-administered medications for this visit.     Allergies  Allergen Reactions  . Prochlorperazine Edisylate Other (See Comments)    Nervous/ flutter/ shakes--compazine  . Adhesive [Tape] Other (See Comments)    Redness/skin peeling Redness/hives from adhesive tape( tolerates latex gloves); tolerates paper tape  . Prochlorperazine Other (See Comments)    Jitters, hyper      Review of Systems:   General:  normal appetite, decreased energy, no weight gain,  + weight loss, no fever  Cardiac:  no chest pain with exertion, no chest pain at rest, +SOB with mild exertion, no resting SOB, no PND, no orthopnea, no palpitations, no arrhythmia, no atrial fibrillation, no LE edema, no dizzy spells, no syncope  Respiratory:  + shortness of breath, no home oxygen, no productive cough, no dry cough, no bronchitis, + wheezing, no hemoptysis, no asthma, no pain with inspiration or cough, no sleep apnea, no CPAP at  night  GI:   no difficulty swallowing, no reflux, no frequent heartburn, no hiatal hernia, no abdominal pain, no constipation, no diarrhea, no hematochezia, no hematemesis, no melena  GU:   no dysuria,  no frequency, no urinary tract infection, no hematuria,  no kidney stones, chronic kidney disease  Vascular:  no pain suggestive of claudication, no pain in feet, no leg cramps, no varicose veins, no DVT, no non-healing foot ulcer  Neuro:   no stroke, no TIA's, no seizures, no headaches, no temporary blindness one eye,  no slurred speech, no peripheral neuropathy, + chronic pain, no instability of gait, no memory/cognitive dysfunction  Musculoskeletal: + arthritis, + joint swelling, + myalgias, some difficulty walking, decreased mobility   Skin:   no rash, no itching, no skin infections, no pressure sores or ulcerations  Psych:   no anxiety, no depression, no nervousness, no unusual recent stress  Eyes:   no blurry vision, + floaters, no recent vision changes,  wears glasses or contacts  ENT:   no hearing loss, no loose or painful teeth, no dentures, last saw dentist 05/2017  Hematologic:  no easy bruising, no abnormal bleeding, no clotting disorder, no frequent epistaxis  Endocrine:  no diabetes, does not check CBG's at home     Physical Exam:   BP 101/68 (BP Location: Left Arm, Patient Position: Sitting, Cuff Size: Large)   Pulse 92   Resp 20   Ht 5\' 6"  (1.676 m)   Wt 198 lb 3.2 oz (89.9 kg)   SpO2 98% Comment: RA  BMI 31.99 kg/m    General:  well-appearing  HEENT:  Unremarkable, NCAT, PERLA, EOMI, oropharynx clear  Neck:   no JVD, no bruits, no adenopathy or thyromegaly  Chest:   clear to auscultation, symmetrical breath sounds, no wheezes, no rhonchi   CV:   RRR, no murmur  Abdomen:  soft, non-tender, no masses or organomegaly  Extremities:  warm, well-perfused, pulses palpable in feet, no LE edema  Rectal/GU  Deferred  Neuro:   Grossly non-focal and symmetrical throughout  Skin:   Clean and dry, no rashes, no breakdown   Diagnostic Tests:                              *[place]*                  *[facility]*                          [address]                        [place], [place] [address]                            [phone]  ------------------------------------------------------------------- Transthoracic Echocardiography  Patient:    [name], [name] MR #:       [id number] Study Date: 06/04/2017 Gender:     F Age:        59 Height:     167.6 cm Weight:     91.2 kg BSA:        2.09 m^2 Pt. Status: Room:       [place], Arshad N  REFERRING    [place],  Arshad N  ADMITTING    Thurnell Lose  PERFORMING   [facility], Inpatient  SONOGRAPHER  Darlina Sicilian, RDCS  cc:  ------------------------------------------------------------------- LV EF: 35% -   40%  ------------------------------------------------------------------- Indications:      Dyspnea 786.09.  ------------------------------------------------------------------- History:   PMH:  Cardiomyopathy Ischemic.  Atrial fibrillation. Coronary artery disease.  Congestive heart failure.  PMH: Myocardial infarction.  Risk factors:  Dyslipidemia.  ------------------------------------------------------------------- Study Conclusions  - Left ventricle: The cavity size was normal. Wall thickness was   normal. Systolic function was moderately  reduced. The estimated   ejection fraction was in the range of 35% to 40%. Global   hypokinesis with basal to mid inferior akinesis. The study is not   technically sufficient to allow evaluation of LV diastolic   function. - Aortic valve: Sclerosis without stenosis. There was no   regurgitation. - Mitral valve: Bioprosthetic valve. Leaflets appear calcified with   minimal excursion. There is an increased mean transmitral   gradient of 21 mmHg and pressure 1/2t of 200 msec, suggestive of   severe mitral stenosis. Calculated AVA of 1.1 cm2 at a HR of 95.   There was mild regurgitation. Valve area by pressure half-time:   1.11 cm^2. - Left atrium: Moderately dilated. - Right ventricle: The cavity size was mildly dilated. Mildly   reduced RV systolic function. AICD wire noted in right ventricle. - Right atrium: The atrium was normal in size. AICD wire noted in   right atrium. - Tricuspid valve: There was moderate regurgitation. - Pulmonary arteries: PA peak pressure: 65 mm Hg (S). - Inferior vena cava: The vessel was dilated. The respirophasic   diameter changes were blunted (< 50%), consistent with elevated   central venous pressure.  Impressions:  - Compared to a prior study in 2016, the LVEF is lower at 35-40%   with global hypokinesis and inferior akinesis. AICD wires are   present. There is a degenerate bioprosthetic mitral valve with   severe stenosis and mild regurgitation. The leafelts are   calcified and poorly mobile. Mean gradient of 21 mmHg (up from 16   mmHg) with calculated AVA of around 1.1 cm2 at HR of 95 bpm,   moderate LAE and moderate pulmonary hypertension with RVSP of 65   mmHg is noted.  ------------------------------------------------------------------- Labs, prior tests, procedures, and surgery: ACID.  Coronary artery bypass grafting (2011).     Mitral valve replacement with a bioprosthetic valve. Unknown type and  size.  ------------------------------------------------------------------- Study data:  Comparison was made to the study of 06/01/2015.  Study status:  Routine.  Procedure:  The patient reported no pain pre or post test. Transthoracic echocardiography. Image quality was adequate.  Study completion:  There were no complications. Transthoracic echocardiography.  M-mode, complete 2D, spectral Doppler, and color Doppler.  Birthdate:  Patient birthdate: 1961/03/02.  Age:  Patient is 57 yr old.  Sex:  Gender: female. BMI: 32.5 kg/m^2.  Blood pressure:     98/70  Patient status: Inpatient.  Study date:  Study date: 06/04/2017. Study time: 08:58 AM.  Location:  Bedside.  -------------------------------------------------------------------  ------------------------------------------------------------------- Left ventricle:  The cavity size was normal. Wall thickness was normal. Systolic function was moderately reduced. The estimated ejection fraction was in the range of 35% to 40%. Global hypokinesis with basal to mid inferior akinesis. The study is not technically sufficient to allow evaluation of LV diastolic function.  ------------------------------------------------------------------- Aortic valve:  Sclerosis without stenosis.  Doppler:  There was no regurgitation.  -------------------------------------------------------------------  Aorta:  Aortic root: The aortic root was normal in size. Ascending aorta: The ascending aorta was normal in size.  ------------------------------------------------------------------- Mitral valve:  Bioprosthetic valve. Leaflets appear calcified with minimal excursion. There is an increased mean transmitral gradient of 21 mmHg and pressure 1/2t of 200 msec, suggestive of severe mitral stenosis. Calculated AVA of 1.1 cm2 at a HR of 95.  Doppler:  There was mild regurgitation.    Valve area by pressure half-time: 1.11 cm^2. Indexed valve area by  pressure half-time: 0.53 cm^2/m^2. Indexed valve area by continuity equation (using LVOT flow): 0.23 cm^2/m^2.    Mean gradient (D): 21 mm Hg. Peak gradient (D): 29 mm Hg.  ------------------------------------------------------------------- Left atrium:  Moderately dilated.  ------------------------------------------------------------------- Atrial septum:  Poorly visualized.  ------------------------------------------------------------------- Right ventricle:  The cavity size was mildly dilated. Mildly reduced RV systolic function. AICD wire noted in right ventricle.   ------------------------------------------------------------------- Pulmonic valve:    The valve appears to be grossly normal. Doppler:  There was trivial regurgitation.  ------------------------------------------------------------------- Tricuspid valve:   Doppler:  There was moderate regurgitation.  ------------------------------------------------------------------- Pulmonary artery:   The main pulmonary artery was normal-sized.  ------------------------------------------------------------------- Right atrium:  The atrium was normal in size. AICD wire noted in right atrium.  ------------------------------------------------------------------- Pericardium:  There was no pericardial effusion.  ------------------------------------------------------------------- Systemic veins: Inferior vena cava: The vessel was dilated. The respirophasic diameter changes were blunted (< 50%), consistent with elevated central venous pressure.  ------------------------------------------------------------------- Measurements   Left ventricle                           Value          Reference  LV ID, ED, PLAX chordal          (H)     53.2  mm       43 - 52  LV ID, ES, PLAX chordal          (H)     48    mm       23 - 38  LV fx shortening, PLAX chordal   (L)     10    %        >=29  LV PW thickness, ED                       8.3   mm       ----------  IVS/LV PW ratio, ED                      1.02           <=1.3  Stroke volume, 2D                        38    ml       ----------  Stroke volume/bsa, 2D                    18    ml/m^2   ----------  LV ejection fraction, 1-p A4C            45    %        ----------  LV end-diastolic volume, 2-p             168   ml       ----------  LV end-systolic volume, 2-p  104   ml       ----------  LV ejection fraction, 2-p                38    %        ----------  Stroke volume, 2-p                       64    ml       ----------  LV end-diastolic volume/bsa, 2-p         80    ml/m^2   ----------  LV end-systolic volume/bsa, 2-p          50    ml/m^2   ----------  Stroke volume/bsa, 2-p                   30.6  ml/m^2   ----------    Ventricular septum                       Value          Reference  IVS thickness, ED                        8.49  mm       ----------    LVOT                                     Value          Reference  LVOT ID, S                               16    mm       ----------  LVOT area                                2.01  cm^2     ----------  LVOT peak velocity, S                    108   cm/s     ----------  LVOT mean velocity, S                    70.1  cm/s     ----------  LVOT VTI, S                              18.7  cm       ----------    Aorta                                    Value          Reference  Aortic root ID, ED                       26    mm       ----------    Left atrium                              Value  Reference  LA ID, A-P, ES                           51    mm       ----------  LA ID/bsa, A-P                   (H)     2.44  cm/m^2   <=2.2  LA volume, S                             84.5  ml       ----------  LA volume/bsa, S                         40.4  ml/m^2   ----------  LA volume, ES, 1-p A4C                   82.8  ml       ----------  LA volume/bsa, ES, 1-p A4C               39.6  ml/m^2    ----------  LA volume, ES, 1-p A2C                   86.2  ml       ----------  LA volume/bsa, ES, 1-p A2C               41.2  ml/m^2   ----------    Mitral valve                             Value          Reference  Mitral E-wave peak velocity              267   cm/s     ----------  Mitral A-wave peak velocity              229   cm/s     ----------  Mitral mean velocity, D                  220   cm/s     ----------  Mitral deceleration time         (H)     588   ms       150 - 230  Mitral pressure half-time                199   ms       ----------  Mitral mean gradient, D                  21    mm Hg    ----------  Mitral peak gradient, D                  29    mm Hg    ----------  Mitral E/A ratio, peak                   1.2            ----------  Mitral valve area, PHT, DP               1.11  cm^2     ----------  Mitral valve area/bsa, PHT, DP  0.53  cm^2/m^2 ----------  Mitral valve area/bsa, LVOT              0.23  cm^2/m^2 ----------  continuity  Mitral annulus VTI, D                    79.1  cm       ----------  Mitral regurg VTI, PISA                  96.8  cm       ----------    Pulmonary arteries                       Value          Reference  PA pressure, S, DP               (H)     65    mm Hg    <=30    Tricuspid valve                          Value          Reference  Tricuspid regurg peak velocity           354   cm/s     ----------  Tricuspid peak RV-RA gradient            50    mm Hg    ----------    Right atrium                             Value          Reference  RA ID, S-I, ES, A4C                      46.9  mm       34 - 49  RA area, ES, A4C                         12.5  cm^2     8.3 - 19.5  RA volume, ES, A/L                       27.3  ml       ----------  RA volume/bsa, ES, A/L                   13    ml/m^2   ----------    Systemic veins                           Value          Reference  Estimated CVP                            3     mm Hg     ----------    Right ventricle                          Value          Reference  TAPSE  16.5  mm       ----------  RV pressure, S, DP               (H)     53    mm Hg    <=30  RV s&', lateral, S                        9.79  cm/s     ----------  Legend: (L)  and  (H)  mark values outside specified reference range.  ------------------------------------------------------------------- Prepared and Electronically Authenticated by  Lyman Bishop MD 2018-12-12T10:16:10   Physicians   Panel Physicians Referring Physician Case Authorizing Physician  Belva Crome, MD (Primary)    Procedures   RIGHT/LEFT HEART CATH AND CORONARY ANGIOGRAPHY  Conclusion    Severe mitral prosthesis stenosis with calculated valve area of 1.02 cm square.  This is based upon a mean gradient across the mitral valve of 18.8 mmHg.  No mitral regurgitation is noted by left ventriculography.  Mild pulmonary hypertension with mean pressure 30 mmHg (42/19 mmHg).  Inferobasal dyskinesis with akinesis of the mid and apical inferior wall.  EF 30-35%.  Normal anterior wall motion.  Normal LVEDP.  Widely patent left main, LAD, and circumflex coronary artery.  The second obtuse marginal contains eccentric proximal 50% narrowing.  Dominant right coronary with mid vessel 80% segmental stenosis with appearance of dissection representing recanalized total occlusion from the culprit event in 2011.  The patient is not having angina and the inferior wall is akinetic.  No indication for PCI in absence of ischemic symptoms.  RECOMMENDATIONS:   Continue medical therapy for LV systolic dysfunction  Refer for surgical opinion concerning valve replacement.  Balloon valvotomy in the setting of bioprosthetic valve is not an option due to high risk of valve leaflet tearing and severe mitral regurgitation.  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 Intravenous sedation with Versed and fentanyl was administered. 1% Xylocaine was infiltrated to achieve local analgesia. Using real-time vascular ultrasound, a double wall stick with an angiocath was utilized to obtain intra-arterial access. A VUS image was saved for the permanent record.The modified Seldinger technique was used to place a 92F " Slender" sheath in the right radial artery. Weight based heparin was administered. Coronary angiography was done using 5 F catheters. Right and left coronary angiography was performed with a JR4 catheter. Left ventricular hemodynamic recordings were performed using the JR 4 catheter.  Right heart catheterization was performed by exchanging a previously placed antecubital IV angio-cath for a 5 French Slender sheath. 1% Xylocaine was used to locally anesthetize the area around the IV site. The IV catheter was wired using an .018 guidewire. The modified Seldinger technique was used to place the 5 [place] sheath. The sheath would not advance completely. Double glove technique was used to enhance sterility. After sheath insertion, a .018 steerable Glidewire was then advanced into the right atrium. The 92F Swan-Ganz catheter was then advanced over the guidewire. Right heart cath was performed using the 5 French balloon tipped catheter and fluoroscopic guidance. Pressures were recorded in each chamber and in the pulmonary capillary wedge position.. The main pulmonary artery O2 saturation was sampled. Simultaneous  wedge LV recording was also performed.  Hemostasis was achieved using a pneumatic band at the radial access site and manual compression in the antecubital region.  During this  procedure the patient is administered a total of Versed 2 mg and Fentanyl 50 mg to achieve and maintain moderate conscious sedation. The patient's heart rate, blood pressure, and oxygen saturation are monitored continuously during the procedure. The period of conscious sedation is 55 minutes, of which I was present face-to-face 100% of this time.   Estimated blood loss <50 mL.  During this procedure the patient was administered the following to achieve and maintain moderate conscious sedation: Versed 2 mg, Fentanyl 50 mcg, while the patient's heart rate, blood pressure, and oxygen saturation were continuously monitored. The period of conscious sedation was 50 minutes, of which I was present face-to-face 100% of this time.  Coronary Findings   Diagnostic  Dominance: Right  Left Circumflex  First Obtuse Marginal Branch  Vessel is small in size.  Second Obtuse Marginal Branch  Ost 2nd Mrg lesion 50% stenosed  Ost 2nd Mrg lesion is 50% stenosed.  Right Coronary Artery  Mid RCA lesion 80% stenosed  Mid RCA lesion is 80% stenosed.  First Right Posterolateral  Vessel is small in size.  Intervention   No interventions have been documented.  Right Heart   Right Heart Pressures Hemodynamic findings consistent with mild pulmonary hypertension. LV EDP is normal.  Wall Motion   Resting       The mid anterior, basilar anterior and apical anterior segments are normal. The mid inferior segment is akinetic. The basilar inferior and apical inferior segments are dyskinetic.           Left Heart   Left Ventricle The left ventricle is moderately dilated. There is moderate to severe left ventricular systolic dysfunction. LV end diastolic pressure is normal. The left ventricular ejection fraction is 35-45% by visual estimate. There are LV function abnormalities due to segmental dysfunction. There is no evidence of mitral regurgitation.  Coronary Diagrams   Diagnostic Diagram       Implants      No implant documentation for this case.  MERGE Images   Show images for CARDIAC CATHETERIZATION   Link to Procedure Log   Procedure Log    Hemo Data    Most Recent Value  Fick Cardiac Output 5.09 L/min  Fick Cardiac Output Index 2.55 (L/min)/BSA  RA A Wave 9 mmHg  RA V Wave 15 mmHg  RA Mean 9 mmHg  RV Systolic Pressure 45 mmHg  RV Diastolic Pressure 5 mmHg  RV EDP 10 mmHg  PA Systolic Pressure 42 mmHg  PA Diastolic Pressure 19 mmHg  PA Mean 30 mmHg  PW A Wave 37 mmHg  PW V Wave 42 mmHg  PW Mean 35 mmHg  AO Systolic Pressure 854 mmHg  AO Diastolic Pressure 67 mmHg  AO Mean 87 mmHg  LV Systolic Pressure 627 mmHg  LV Diastolic Pressure 6 mmHg  LV EDP 18 mmHg  Arterial Occlusion Pressure Extended Systolic Pressure 035 mmHg  Arterial Occlusion Pressure Extended Diastolic Pressure 61 mmHg  Arterial Occlusion Pressure Extended Mean Pressure 79 mmHg  Left Ventricular Apex Extended Systolic Pressure 009 mmHg  Left Ventricular Apex Extended Diastolic Pressure 9 mmHg  Left Ventricular Apex Extended EDP Pressure 20 mmHg  QP/QS 1  TPVR Index 11.79 HRUI  TSVR Index 31.84 HRUI  PVR SVR Ratio 0.11  TPVR/TSVR Ratio 0.37    ADDENDUM REPORT: 08/29/2017 13:45  CLINICAL DATA:  57 year old female with h/o inferior wall MI with inferior aneurysm, papillary muscle rupture, s/p aneurysm repair and MVR with a 29 mm Edwards-Perimount bovine stented pericardial tissue valve in 2011, now being evaluated for TMVR for bioprosthetic valve degenaration.  EXAM: Cardiac TAVR CT  TECHNIQUE: The patient was scanned on a [employer]. A 120 kV retrospective scan was triggered in the descending thoracic aorta at 111 HU's. Gantry rotation speed was 250 msecs and collimation was .6 mm. No beta blockade or nitro were given. The 3D data set was reconstructed in 5% intervals of the R-R cycle. Systolic and diastolic phases were analyzed on a dedicated work station using MPR, MIP  and VRT modes. The patient received 80 cc of contrast.  FINDINGS: Mitral valve: S/P MVR with a 29 mm Edwards-Perimount bovine stented pericardial tissue valve. Leaflets are thickened and have moderate calcifications. Leaflets have severely limited opening with MVA 1.01 cm2.  Prosthetic valve ring measurements:  Maximum/Minimum Diameter: 26.9 x 26.9 mm  Perimeter: 83.3 mm  Area: 544 mm2  Aortic Valve: Trileaflet, no calcifications, opens well.  Aorta: Normal size, no dissection, mild diffuse atherosclerotic plaque and calcifications in the aortic arch and descending thoracic aorta.  Sinotubular Junction: 25 x 25 mm  Ascending Thoracic Aorta: 34 x 32 mm  Aortic Arch: 28 x 24 mm  Descending Thoracic Aorta: 20 x 19 mm  Sinus of Valsalva Measurements:  Non-coronary: 31 mm  Right -coronary: 28 mm  Left -coronary: 29 mm  Coronary Arteries: Normal origin  No thrombus was seen in the left atrial appendage.  There is a large pseudoaneurysm in the basal and mid inferior wall measuring 55 x 30 mm.  Pulmonary artery is dilated measuring 33 x 30 mm.  PM leads are seen in the right atrium and right ventricle.  IMPRESSION: 1. S/P MVR with a 29 mm Edwards-Perimount bovine stented pericardial tissue valve. Leaflets are thickened and have moderate calcifications. Leaflets have severely limited opening with MVA 1.01 cm2. Bioprosthetic mitral valve ring measurements suitable for delivery of a 29 mm Edwards-SAPIEN 3 valve. This is in agreement with suggestions from the Valve in Valve for mitral valve app.  2.  No thrombus was seen in the left atrial appendage.  3. There is a large pseudoaneurysm in the basal and mid inferior wall measuring 55 x 30 mm.  4. Pulmonary artery is dilated measuring 33 x 30 mm suggestive of pulmonary hypertension.  5.  PM leads are seen in the right atrium and right ventricle.   Electronically Signed   By: Ena Dawley   On: 08/29/2017 13:45   Addended by Dorothy Spark, MD on 08/29/2017 1:47 PM    Study Result   EXAM: OVER-READ INTERPRETATION  CT CHEST  The following report is an over-read performed by radiologist Dr. Vinnie Langton of [facility], [place] on 08/27/2017. This over-read does not include interpretation of cardiac or coronary anatomy or pathology. The coronary CTA interpretation by the cardiologist is attached.  COMPARISON:  Chest CT 12/06/2016.  FINDINGS: Extracardiac findings will be described separately under dictation for contemporaneously obtained CTA chest, abdomen and pelvis.  IMPRESSION: 1. Please see separate dictation for contemporaneously obtained CTA chest, abdomen and pelvis 08/26/2017 for full description of relevant extracardiac findings.  Electronically Signed: By: Vinnie Langton M.D. On: 08/27/2017 09:39       CLINICAL DATA:  57 year old female with history of prosthetic mitral valve dysfunction. Preprocedural study prior to potential transcatheter mitral valve  replacement (TMVR) procedure.  EXAM: CT ANGIOGRAPHY CHEST, ABDOMEN AND PELVIS  TECHNIQUE: Multidetector CT imaging through the chest, abdomen and pelvis was performed using the standard protocol during bolus administration of intravenous contrast. Multiplanar reconstructed images and MIPs were obtained and reviewed to evaluate the vascular anatomy.  CONTRAST:  100 mL of Isovue 300  COMPARISON:  Chest CT 06/05/2017. CT the abdomen and pelvis 06/03/2017.  FINDINGS: CTA CHEST FINDINGS  Cardiovascular: Heart size is mildly enlarged with apical aneurysm of the left ventricle where there is profound myocardial thinning, indicative of prior distal LAD territory myocardial infarction. There is also profound myocardial thinning in the inferior wall segments from the mid ventricle to the base where there is pseudoaneurysm formation, presumably sequela of prior  infarction. There is no significant pericardial fluid, thickening or pericardial calcification. There is aortic atherosclerosis, as well as atherosclerosis of the great vessels of the mediastinum and the coronary arteries, including calcified atherosclerotic plaque in the left anterior descending coronary artery. Status post median sternotomy for mitral valve replacement with a stented bioprosthesis. Left-sided pacemaker/AICD in place with lead tips terminating in the right atrium and right ventricular apex.  Mediastinum/Lymph Nodes: 15 mm short axis low right paratracheal lymph node, stable compared to the prior study. No other lymphadenopathy noted elsewhere in the mediastinal or hilar nodal stations. Esophagus is unremarkable in appearance. No axillary lymphadenopathy  Lungs/Pleura: 1 cm ground-glass attenuation nodule near the apex of the right upper lobe (axial image 27 of series 15), similar to prior study from 06/05/2017. No confluent consolidative airspace disease. Trace bilateral pleural effusions. Patchy areas of mild ground-glass attenuation noted throughout the lung bases, similar to the prior study.  Musculoskeletal/Soft Tissues: Median sternotomy wires. There are no aggressive appearing lytic or blastic lesions noted in the visualized portions of the skeleton.  CTA ABDOMEN AND PELVIS FINDINGS  Hepatobiliary: 5 mm hypervascular lesion in segment 4A of the liver (image 98 of series 14), too small to characterize, but statistically likely a benign flash fill cavernous hemangioma or other perfusion anomaly. 8 mm low-attenuation lesion in segment 1 of the liver, decreased in size compared to prior examination 06/05/2017, presumably a partially involuted cyst. No other suspicious appearing hepatic lesions are noted. No intra or extrahepatic biliary ductal dilatation. Gallbladder is normal in appearance.  Pancreas: No pancreatic mass. No pancreatic ductal  dilatation. No pancreatic or peripancreatic fluid or inflammatory changes.  Spleen: Unremarkable.  Adrenals/Urinary Tract: Bilateral kidneys and bilateral adrenal glands are normal in appearance. No hydroureteronephrosis. Urinary bladder is normal in appearance.  Stomach/Bowel: Normal appearance of the stomach. No pathologic dilatation of small bowel or colon. Previously noted diffuse colonic wall thickening has resolved. The appendix is not confidently identified and may be surgically absent. Regardless, there are no inflammatory changes noted adjacent to the cecum to suggest the presence of an acute appendicitis at this time.  Vascular/Lymphatic: Aortic atherosclerosis with vascular findings and measurements pertinent to potential TAVR procedure, as detailed below. No aneurysm or dissection noted in the abdominal or pelvic vasculature. The celiac axis, superior mesenteric artery and inferior mesenteric artery are all widely patent without hemodynamically significant stenosis. Single renal arteries bilaterally are widely patent without hemodynamically significant stenosis. Multiple enlarging pelvic lymph nodes, largest of which is along the course of the inferior mesenteric artery measuring up to 2.2 cm in diameter (axial image 167 of series 14), compatible with nodal metastatic disease. Several additional lymph nodes are also noted in the internal iliac distributions, most evident in the  presacral space where the largest of these currently measures 14 mm in short axis (axial image 184 of series 14).  Reproductive: Status post total abdominal hysterectomy and bilateral salpingo-oophorectomy.  Other: Multiple large soft tissue masses in the anatomic pelvis, increased in size compared to the prior examination. The largest of these masses is in the central anatomic pelvis (axial image 186 of series 14), currently measuring 5.1 x 4.6 cm (previously 4.1 x 4.0 cm on  06/03/2017). Peritoneal implant in the right pericolic gutter (axial image 156 of series 14), similar to the prior study measuring 2.1 x 2.9 cm. No significant volume of ascites. No pneumoperitoneum.  Musculoskeletal: There are no aggressive appearing lytic or blastic lesions noted in the visualized portions of the skeleton. Status post right hip arthroplasty.  VASCULAR MEASUREMENTS PERTINENT TO TAVR:  AORTA:  Minimal Aortic Diameter-8 x 9 mm  Severity of Aortic Calcification-moderate  RIGHT PELVIS:  Right Common Iliac Artery -  Minimal Diameter-5.2 x 4.2 mm  Tortuosity-mild  Calcification-moderate  Right External Iliac Artery -  Minimal Diameter-5.8 x 5.9 mm  Tortuosity - mild  Calcification-none  Right Common Femoral Artery -  Minimal Diameter-5.5 x 4.7 mm  Tortuosity - mild  Calcification-mild  LEFT PELVIS:  Left Common Iliac Artery -  Minimal Diameter-6.5 x 4.6 mm  Tortuosity - mild  Calcification-moderate  Left External Iliac Artery -  Minimal Diameter-5.9 x 6.0 mm  Tortuosity - mild  Calcification-none  Left Common Femoral Artery -  Minimal Diameter-6.0 x 5.0 mm  Tortuosity - mild  Calcification-none  Review of the MIP images confirms the above findings.  IMPRESSION: 1. Vascular findings and measurements pertinent to potential TMVR procedure, as detailed above. 2. Status post median sternotomy for mitral valve replacement with a stented bioprosthesis noted. 3. Aneurysmal dilatation of the left ventricular apex, and profound myocardial thinning in pseudoaneurysm of the inferior wall the left ventricle from the mid ventricle to the base, indicative of prior myocardial infarctions. 4. Trace bilateral pleural effusions lying dependently. Progressive intraperitoneal metastatic disease in the low anatomic pelvis with developing lymph node metastases as well in the anatomic pelvis, as detailed above. 5.  Aortic atherosclerosis, in addition to left anterior descending coronary artery disease. Please note that although the presence of coronary artery calcium documents the presence of coronary artery disease, the severity of this disease and any potential stenosis cannot be assessed on this non-gated CT examination. Assessment for potential risk factor modification, dietary therapy or pharmacologic therapy may be warranted, if clinically indicated. 6. Additional incidental findings, as above.  Aortic Atherosclerosis (ICD10-I70.0).   Electronically Signed   By: Vinnie Langton M.D.   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  She now has New [place] Heart Association class IIb symptoms of progressive exertional shortness of breath and fatigue consistent with chronic systolic and diastolic congestive heart failure.  I have personally reviewed her most recent transthoracic echo, cardiac catheterization, and CTA studies.  The echocardiogram shows moderate left ventricular systolic dysfunction with inferior wall akinesis and estimated ejection fraction of 35%.  The mitral bioprosthesis has thickened leaflets with restricted mobility and a mean transvalvular gradient of 21 mmHg corresponding to a calculated valve area of 1.1 cm.  There is mild mitral  regurgitation.  Cardiac catheterization shows an 80% stenosis in the mid right coronary artery with mild nonobstructive disease on the left.  The mean transvalvular gradient across the mitral valve was estimated at 18.8 mmHg.  I agree that mitral valve replacement is indicated in this patient with progressive symptoms and severe mitral stenosis.  She  would be at high operative risk for redo sternotomy and open surgical redo mitral valve replacement due to her prior surgery and underlying metastatic ovarian cancer with further enlargement of the largest mass in her pelvis.  I would not consider her a candidate for open surgical redo valve replacement.  I agree that transcatheter valve in valve mitral valve replacement is the best option for this patient.  Her gated cardiac CTA shows left ventricular enlargement with what is been described as a pseudoaneurysm involving the inferior wall of left ventricle with a thin-walled and aneurysmal appearing left ventricular apex.  I agree with Dr. Roxy Manns that most likely this appearance is due to the patch repair of her inferior posterior wall and not a pseudoaneurysm.  The options for transcatheter mitral valve and valve replacement include transapical insertion and transseptal insertion.  I think transseptal insertion is not ideal in this patient due to the previous surgery and altered anatomy with this left ventricular patch. Trans-apical insertion will be a more direct approach and although the apex appears somewhat thinned it may be tough scar.  Femoral vascular access should be maintained so that if the patient requires placement on temporary cardiopulmonary bypass to repair the apical insertion site it can be performed expeditiously.  The patient was counseled at length regarding treatment alternatives for management of severe symptomatic prosthetic mitral valve stenosis. The risks and benefits of surgical intervention has been discussed in detail. Long-term prognosis with medical therapy was discussed. Alternative approaches such as conventional surgical mitral valve replacement, transcatheter mitral valve in valve replacement, and palliative medical therapy were compared and contrasted at length. This discussion was placed in the context of the patient's own specific clinical presentation and past medical history.  All of her questions have been addressed. The patient is eager to proceed with surgical management as soon as possible.   Following the decision to proceed with transcatheter mitral valve in valve replacement, a discussion was held regarding what types of management strategies would be attempted intraoperatively in the event of life-threatening complications, including whether or not the patient would be considered a candidate for the use of cardiopulmonary bypass and/or conversion to open sternotomy for attempted surgical intervention. The patient is aware of the fact that transient use of cardiopulmonary bypass may be necessary, but I would not consider her a candidate for redo median sternotomy under any circumstances, even if she were to develop potentially lethal complications related to transcatheter valve replacement. The patient has been advised of a variety of complications that might develop including but not limited to risks of death, stroke, paravalvular leak, aortic dissection or other major vascular complications, aortic annulus rupture, device embolization, cardiac rupture or perforation, mitral regurgitation, acute myocardial infarction, arrhythmia, heart block or bradycardia requiring permanent pacemaker placement, congestive heart failure, respiratory failure, renal failure, pneumonia, infection, other late complications related to structural valve deterioration or migration, or other complications that might ultimately cause a temporary or permanent loss of functional independence or other long term morbidity. The patient provides full informed consent for the procedure as described and all questions were answered.   Plan:  The patient will be scheduled for transapical  valve and valve mitral replacement on 09/16/2017.   I spent 45 minutes performing this consultation and > 50% of this time was spent face to face counseling and coordinating the care of this patient's severe prosthetic  mitral valve stenosis.    Gaye Pollack, MD 09/04/2017

## 2017-09-11 ENCOUNTER — Telehealth: Payer: Self-pay | Admitting: Internal Medicine

## 2017-09-11 ENCOUNTER — Ambulatory Visit (INDEPENDENT_AMBULATORY_CARE_PROVIDER_SITE_OTHER): Payer: BLUE CROSS/BLUE SHIELD | Admitting: *Deleted

## 2017-09-11 DIAGNOSIS — I4729 Other ventricular tachycardia: Secondary | ICD-10-CM

## 2017-09-11 DIAGNOSIS — I472 Ventricular tachycardia: Secondary | ICD-10-CM | POA: Diagnosis not present

## 2017-09-11 NOTE — Pre-Procedure Instructions (Signed)
HAILEYANN STAIGER  09/11/2017      [id number] - [place], [place] - [address] [place] [address] Phone: [phone] Fax: [phone]    Your procedure is scheduled on Tuesday, March 26th   Report to [facility] Admitting at 10:45AM             (posted surgery time 12:45p - 4:15p)   Call this number if you have problems the morning of surgery:  [phone]   Remember:              4-5 days prior to surgery, STOP TAKING any Vitamins, Herbal Supplements, Anti-inflammatories.   Do not eat food or drink liquids after midnight, Monday.   Take these medicines the morning of surgery with A SIP OF WATER :    Do not wear jewelry, make-up or nail polish.  Do not wear lotions, powders, or perfumes, or deodorant.  Do not shave 48 hours prior to surgery.    Do not bring valuables to the hospital.  [facility] is not responsible for any belongings or valuables.  Contacts, dentures or bridgework may not be worn into surgery.  Leave your suitcase in the car.  After surgery it may be brought to your room.  For patients admitted to the hospital, discharge time will be determined by your treatment team.  Please read over the following fact sheets that you were given. Pain Booklet, MRSA Information and Surgical Site Infection Prevention      [place]- Preparing For Surgery  Before surgery, you can play an important role. Because skin is not sterile, your skin needs to be as free of germs as possible. You can reduce the number of germs on your skin by washing with CHG (chlorahexidine gluconate) Soap before surgery.  CHG is an antiseptic cleaner which kills germs and bonds with the skin to continue killing germs even after washing.  Please do not use if you have an allergy to CHG or antibacterial soaps. If your skin becomes reddened/irritated stop using the CHG.  Do not shave (including legs and underarms) for at least 48  hours prior to first CHG shower. It is OK to shave your face.  Please follow these instructions carefully.   1. Shower the NIGHT BEFORE SURGERY and the MORNING OF SURGERY with CHG.   2. If you chose to wash your hair, wash your hair first as usual with your normal shampoo.  3. After you shampoo, rinse your hair and body thoroughly to remove the shampoo.  4. Use CHG as you would any other liquid soap. You can apply CHG directly to the skin and wash gently with a scrungie or a clean washcloth.   5. Apply the CHG Soap to your body ONLY FROM THE NECK DOWN.  Do not use on open wounds or open sores. Avoid contact with your eyes, ears, mouth and genitals (private parts). Wash Face and genitals (private parts)  with your normal soap.  6. Wash thoroughly, paying special attention to the area where your surgery will be performed.  7. Thoroughly rinse your body with warm water from the neck down.  8. DO NOT shower/wash with your normal soap after using and rinsing off the CHG Soap.  9. Pat yourself dry with a CLEAN TOWEL.  10. Wear CLEAN PAJAMAS to bed the night before surgery, wear comfortable clothes the morning of surgery  11. Place CLEAN SHEETS on  your bed the night of your first shower and DO NOT SLEEP WITH PETS.    Day of Surgery: Do not apply any deodorants/lotions. Please wear clean clothes to the hospital/surgery center.

## 2017-09-11 NOTE — Pre-Procedure Instructions (Signed)
[name]  09/11/2017      [id number] - [place], [place] - [address] [place] [address] Phone: [phone] Fax: [phone]    Your procedure is scheduled on September 16, 2017.  Report to [facility] Admitting at 1030 AM.  Call this number if you have problems the morning of surgery:  [phone]   Remember:  Do not eat food or drink liquids after midnight.  Take these medicines the morning of surgery with A SIP OF WATER (none)             4-5 days prior to surgery, STOP TAKING any Vitamins, Herbal           Supplements, Anti-inflammatories   Do not wear jewelry, make-up or nail polish.  Do not wear lotions, powders, or perfumes, or deodorant.  Do not shave 48 hours prior to surgery.    Do not bring valuables to the hospital.  [facility] is not responsible for any belongings or valuables.  Contacts, dentures or bridgework may not be worn into surgery.  Leave your suitcase in the car.  After surgery it may be brought to your room.  For patients admitted to the hospital, discharge time will be determined by your treatment team.  Patients discharged the day of surgery will not be allowed to drive home.    [place]- Preparing For Surgery  Before surgery, you can play an important role. Because skin is not sterile, your skin needs to be as free of germs as possible. You can reduce the number of germs on your skin by washing with CHG (chlorahexidine gluconate) Soap before surgery.  CHG is an antiseptic cleaner which kills germs and bonds with the skin to continue killing germs even after washing.  Please do not use if you have an allergy to CHG or antibacterial soaps. If your skin becomes reddened/irritated stop using the CHG.  Do not shave (including legs and underarms) for at least 48 hours prior to first CHG shower. It is OK to shave your face.  Please follow these instructions  carefully.   1. Shower the NIGHT BEFORE SURGERY and the MORNING OF SURGERY with CHG.   2. If you chose to wash your hair, wash your hair first as usual with your normal shampoo.  3. After you shampoo, rinse your hair and body thoroughly to remove the shampoo.  4. Use CHG as you would any other liquid soap. You can apply CHG directly to the skin and wash gently with a scrungie or a clean washcloth.   5. Apply the CHG Soap to your body ONLY FROM THE NECK DOWN.  Do not use on open wounds or open sores. Avoid contact with your eyes, ears, mouth and genitals (private parts). Wash Face and genitals (private parts)  with your normal soap.  6. Wash thoroughly, paying special attention to the area where your surgery will be performed.  7. Thoroughly rinse your body with warm water from the neck down.  8. DO NOT shower/wash with your normal soap after using and rinsing off the CHG Soap.  9. Pat yourself dry with a CLEAN TOWEL.  10. Wear CLEAN PAJAMAS to bed the night before surgery, wear comfortable clothes the morning of surgery  11. Place CLEAN SHEETS on your bed the night of your first shower and DO NOT SLEEP WITH PETS.  Day of Surgery: Do not apply any deodorants/lotions.  Please wear clean clothes to the hospital/surgery center.    Please read over the following fact sheets that you were given. Pain Booklet, Coughing and Deep Breathing, MRSA Information and Surgical Site Infection Prevention

## 2017-09-11 NOTE — Telephone Encounter (Signed)
Spoke with [name] informed her that she could send in transmission and I would schedule it so that it is processed. She voiced understanding. Also verified June apt with GT with pt.

## 2017-09-11 NOTE — Progress Notes (Addendum)
PCP: Daneen Schick, MD  Cardiologist: Daneen Schick, MD  EKG: 09/03/17 in EPIC  Stress test: pt denies  ECHO: 06/04/17 in EPIC  Cardiac Cath:07/02/17 in EPIC  Chest x-ray:obtained today per ordered  Pt stopped taking her aspirin as instructed by MD 09/01/17. Pulmonary function test done 08/26/17  Pt has [employer] ICD-called representativie Dionne Milo Bizzotto covering for [employer]) and made him aware of surgery. They will be here day of surgery at 1200.

## 2017-09-11 NOTE — Telephone Encounter (Signed)
New Message    1. Has your device fired?  no  2. Is you device beeping? No  3. Are you experiencing draining or swelling at device site? no  4. Are you calling to see if we received your device transmission? no 5. Have you passed out? No   Patient is calling in to see if she needs to be set up for a home remote check. She was suppose to come in for a physical check in March but needed to reschedule. Please call to discuss.    Please route to [place]

## 2017-09-12 ENCOUNTER — Other Ambulatory Visit: Payer: Self-pay | Admitting: *Deleted

## 2017-09-12 ENCOUNTER — Encounter (HOSPITAL_COMMUNITY): Payer: Self-pay

## 2017-09-12 ENCOUNTER — Encounter (HOSPITAL_COMMUNITY)
Admission: RE | Admit: 2017-09-12 | Discharge: 2017-09-12 | Disposition: A | Discharge status: Home / Self Care | Payer: BLUE CROSS/BLUE SHIELD | Source: Ambulatory Visit | Attending: Cardiovascular Disease | Admitting: Cardiovascular Disease

## 2017-09-12 ENCOUNTER — Ambulatory Visit (HOSPITAL_COMMUNITY)
Admission: RE | Admit: 2017-09-12 | Discharge: 2017-09-12 | Disposition: A | Discharge status: Home / Self Care | Payer: BLUE CROSS/BLUE SHIELD | Source: Ambulatory Visit | Attending: Cardiovascular Disease | Admitting: Cardiovascular Disease

## 2017-09-12 ENCOUNTER — Other Ambulatory Visit: Payer: Self-pay

## 2017-09-12 ENCOUNTER — Encounter: Payer: Self-pay | Admitting: Cardiology

## 2017-09-12 DIAGNOSIS — Z7982 Long term (current) use of aspirin: Secondary | ICD-10-CM | POA: Diagnosis not present

## 2017-09-12 DIAGNOSIS — Z01812 Encounter for preprocedural laboratory examination: Secondary | ICD-10-CM | POA: Insufficient documentation

## 2017-09-12 DIAGNOSIS — Z951 Presence of aortocoronary bypass graft: Secondary | ICD-10-CM | POA: Insufficient documentation

## 2017-09-12 DIAGNOSIS — I2510 Atherosclerotic heart disease of native coronary artery without angina pectoris: Secondary | ICD-10-CM | POA: Diagnosis not present

## 2017-09-12 DIAGNOSIS — Z95810 Presence of automatic (implantable) cardiac defibrillator: Secondary | ICD-10-CM | POA: Diagnosis not present

## 2017-09-12 DIAGNOSIS — Z79899 Other long term (current) drug therapy: Secondary | ICD-10-CM | POA: Diagnosis not present

## 2017-09-12 DIAGNOSIS — N183 Chronic kidney disease, stage 3 (moderate): Secondary | ICD-10-CM | POA: Diagnosis not present

## 2017-09-12 DIAGNOSIS — Z01810 Encounter for preprocedural cardiovascular examination: Secondary | ICD-10-CM | POA: Insufficient documentation

## 2017-09-12 DIAGNOSIS — T8209XD Other mechanical complication of heart valve prosthesis, subsequent encounter: Secondary | ICD-10-CM

## 2017-09-12 DIAGNOSIS — Y839 Surgical procedure, unspecified as the cause of abnormal reaction of the patient, or of later complication, without mention of misadventure at the time of the procedure: Secondary | ICD-10-CM | POA: Diagnosis not present

## 2017-09-12 DIAGNOSIS — Z01818 Encounter for other preprocedural examination: Secondary | ICD-10-CM | POA: Diagnosis present

## 2017-09-12 DIAGNOSIS — I480 Paroxysmal atrial fibrillation: Secondary | ICD-10-CM | POA: Insufficient documentation

## 2017-09-12 DIAGNOSIS — E7800 Pure hypercholesterolemia, unspecified: Secondary | ICD-10-CM | POA: Insufficient documentation

## 2017-09-12 DIAGNOSIS — Z6831 Body mass index (BMI) 31.0-31.9, adult: Secondary | ICD-10-CM | POA: Diagnosis not present

## 2017-09-12 DIAGNOSIS — I5023 Acute on chronic systolic (congestive) heart failure: Secondary | ICD-10-CM | POA: Insufficient documentation

## 2017-09-12 DIAGNOSIS — E669 Obesity, unspecified: Secondary | ICD-10-CM | POA: Insufficient documentation

## 2017-09-12 DIAGNOSIS — Z87891 Personal history of nicotine dependence: Secondary | ICD-10-CM | POA: Insufficient documentation

## 2017-09-12 LAB — URINALYSIS, ROUTINE W REFLEX MICROSCOPIC
Bilirubin Urine: NEGATIVE
GLUCOSE, UA: NEGATIVE mg/dL
Hgb urine dipstick: NEGATIVE
Ketones, ur: NEGATIVE mg/dL
LEUKOCYTES UA: NEGATIVE
NITRITE: NEGATIVE
PH: 5 (ref 5.0–8.0)
Protein, ur: NEGATIVE mg/dL
Specific Gravity, Urine: 1.008 (ref 1.005–1.030)

## 2017-09-12 LAB — COMPREHENSIVE METABOLIC PANEL
ALBUMIN: 4.1 g/dL (ref 3.5–5.0)
ALK PHOS: 58 U/L (ref 38–126)
ALT: 13 U/L — ABNORMAL LOW (ref 14–54)
ANION GAP: 11 (ref 5–15)
AST: 20 U/L (ref 15–41)
BUN: 9 mg/dL (ref 6–20)
CALCIUM: 9.3 mg/dL (ref 8.9–10.3)
CO2: 20 mmol/L — AB (ref 22–32)
Chloride: 105 mmol/L (ref 101–111)
Creatinine, Ser: 1.24 mg/dL — ABNORMAL HIGH (ref 0.44–1.00)
GFR calc non Af Amer: 47 mL/min — ABNORMAL LOW (ref >60)
GFR, EST AFRICAN AMERICAN: 54 mL/min — AB (ref >60)
GLUCOSE: 87 mg/dL (ref 65–99)
POTASSIUM: 3.9 mmol/L (ref 3.5–5.1)
SODIUM: 136 mmol/L (ref 135–145)
TOTAL PROTEIN: 6.8 g/dL (ref 6.5–8.1)
Total Bilirubin: 0.6 mg/dL (ref 0.3–1.2)

## 2017-09-12 LAB — PROTIME-INR
INR: 1.02
PROTHROMBIN TIME: 13.3 s (ref 11.4–15.2)

## 2017-09-12 LAB — BLOOD GAS, ARTERIAL
ACID-BASE DEFICIT: 0.9 mmol/L (ref 0.0–2.0)
BICARBONATE: 23.1 mmol/L (ref 20.0–28.0)
Drawn by: 421801
FIO2: 21
O2 SAT: 97.8 %
PATIENT TEMPERATURE: 98.6
pCO2 arterial: 37.4 mmHg (ref 32.0–48.0)
pH, Arterial: 7.408 (ref 7.350–7.450)
pO2, Arterial: 105 mmHg (ref 83.0–108.0)

## 2017-09-12 LAB — APTT: APTT: 31 s (ref 24–36)

## 2017-09-12 LAB — CBC
HCT: 40.4 % (ref 36.0–46.0)
HEMOGLOBIN: 13.1 g/dL (ref 12.0–15.0)
MCH: 29.3 pg (ref 26.0–34.0)
MCHC: 32.4 g/dL (ref 30.0–36.0)
MCV: 90.4 fL (ref 78.0–100.0)
PLATELETS: 148 10*3/uL — AB (ref 150–400)
RBC: 4.47 MIL/uL (ref 3.87–5.11)
RDW: 14.3 % (ref 11.5–15.5)
WBC: 9.7 10*3/uL (ref 4.0–10.5)

## 2017-09-12 LAB — SURGICAL PCR SCREEN
MRSA, PCR: NEGATIVE
STAPHYLOCOCCUS AUREUS: NEGATIVE

## 2017-09-12 LAB — BRAIN NATRIURETIC PEPTIDE: B Natriuretic Peptide: 327.3 pg/mL — ABNORMAL HIGH (ref 0.0–100.0)

## 2017-09-12 LAB — HEMOGLOBIN A1C
Hgb A1c MFr Bld: 5 % (ref 4.8–5.6)
Mean Plasma Glucose: 96.8 mg/dL

## 2017-09-12 MED ORDER — CHLORHEXIDINE GLUCONATE 4 % EX LIQD: 30.0000 mL | CUTANEOUS | Status: DC

## 2017-09-12 NOTE — Progress Notes (Signed)
Remote ICD transmission.   

## 2017-09-15 MED ORDER — SODIUM CHLORIDE 0.9 % IV SOLN
30.0000 ug/min | INTRAVENOUS | Status: DC
  Filled 2017-09-15: qty 2

## 2017-09-15 MED ORDER — NOREPINEPHRINE BITARTRATE 1 MG/ML IV SOLN
0.0000 ug/min | INTRAVENOUS | Status: DC
  Filled 2017-09-15: qty 4

## 2017-09-15 MED ORDER — DOPAMINE-DEXTROSE 3.2-5 MG/ML-% IV SOLN
0.0000 ug/kg/min | INTRAVENOUS | Status: DC
  Filled 2017-09-15 (×2): qty 250

## 2017-09-15 MED ORDER — SODIUM CHLORIDE 0.9 % IV SOLN
INTRAVENOUS | Status: DC
  Filled 2017-09-15: qty 1

## 2017-09-15 MED ORDER — NITROGLYCERIN IN D5W 200-5 MCG/ML-% IV SOLN
2.0000 ug/min | INTRAVENOUS | Status: DC
Start: 2017-09-16 — End: 2017-09-16
  Filled 2017-09-15: qty 250

## 2017-09-15 MED ORDER — MAGNESIUM SULFATE 50 % IJ SOLN
40.0000 meq | INTRAMUSCULAR | Status: DC
  Filled 2017-09-15: qty 9.85

## 2017-09-15 MED ORDER — CEFUROXIME SODIUM 1.5 G IV SOLR
1.5000 g | INTRAVENOUS | Status: AC
  Administered 2017-09-16: 1.5 g via INTRAVENOUS
  Filled 2017-09-15: qty 1.5

## 2017-09-15 MED ORDER — POTASSIUM CHLORIDE 2 MEQ/ML IV SOLN
80.0000 meq | INTRAVENOUS | Status: DC
  Filled 2017-09-15: qty 40

## 2017-09-15 MED ORDER — SODIUM CHLORIDE 0.9 % IV SOLN
1500.0000 mg | INTRAVENOUS | Status: AC
  Administered 2017-09-16: 1500 mg via INTRAVENOUS
  Filled 2017-09-15 (×2): qty 1500

## 2017-09-15 MED ORDER — SODIUM CHLORIDE 0.9 % IV SOLN
INTRAVENOUS | Status: DC
  Filled 2017-09-15: qty 30

## 2017-09-15 MED ORDER — DEXMEDETOMIDINE HCL IN NACL 400 MCG/100ML IV SOLN
0.1000 ug/kg/h | INTRAVENOUS | Status: AC
  Administered 2017-09-16: .4 ug/kg/h via INTRAVENOUS
  Filled 2017-09-15: qty 100

## 2017-09-15 MED ORDER — EPINEPHRINE PF 1 MG/ML IJ SOLN
0.0000 ug/min | INTRAMUSCULAR | Status: DC
  Filled 2017-09-15: qty 4

## 2017-09-15 NOTE — Anesthesia Preprocedure Evaluation (Addendum)
Anesthesia Evaluation  Patient identified by MRN, date of birth, ID band Patient awake    Reviewed: Allergy & Precautions, H&P , NPO status , Patient's Chart, lab work & pertinent test results  Airway Mallampati: II  TM Distance: >3 FB Neck ROM: Full    Dental no notable dental hx. (+) Teeth Intact, Dental Advisory Given   Pulmonary asthma , COPD,  COPD inhaler, Current Smoker,    Pulmonary exam normal breath sounds clear to auscultation       Cardiovascular Exercise Tolerance: Good + CAD, + CABG and +CHF  + Cardiac Defibrillator + Valvular Problems/Murmurs  Rhythm:Regular Rate:Normal     Neuro/Psych Anxiety negative neurological ROS  negative psych ROS   GI/Hepatic negative GI ROS, Neg liver ROS,   Endo/Other  Hypothyroidism   Renal/GU Renal InsufficiencyRenal disease  negative genitourinary   Musculoskeletal  (+) Arthritis , Osteoarthritis,    Abdominal   Peds  Hematology negative hematology ROS (+) anemia ,   Anesthesia Other Findings   Reproductive/Obstetrics negative OB ROS                            Anesthesia Physical Anesthesia Plan  ASA: IV  Anesthesia Plan: General   Post-op Pain Management:    Induction: Intravenous  PONV Risk Score and Plan: 2 and Midazolam, Ondansetron and Dexamethasone  Airway Management Planned: Oral ETT  Additional Equipment: Arterial line, CVP and Ultrasound Guidance Line Placement  Intra-op Plan:   Post-operative Plan: Extubation in OR and Possible Post-op intubation/ventilation  Informed Consent: I have reviewed the patients History and Physical, chart, labs and discussed the procedure including the risks, benefits and alternatives for the proposed anesthesia with the patient or authorized representative who has indicated his/her understanding and acceptance.   Dental advisory given  Plan Discussed with: CRNA  Anesthesia Plan Comments:          Anesthesia Quick Evaluation

## 2017-09-15 NOTE — Progress Notes (Signed)
Anesthesia Chart Review: Patient is a 57 year old female scheduled for transcatheter mitral valve replacement, transapical approach by Dr. Darylene Price and Dr. [name].  History includes smoking, CAD with large RV/inferior MI 08/06/06 complicated by pseudoaneurysm s/p aneurysmectomy and CABG 10/2009 with papillary muscle rupture causing severe MR s/p bioprosthetic mitral valve replacement 10/2009 [facility]) with severe mitral stenosis and mild MR from prosthesis degeneration 05/2017, VT arrest/ischemic cardiomyopathy (s/p [employer] Jude ICD 10/2009; s/p new ventricular lead 6/57/84), chronic systolic CHF, PAF, CKD stage III, PAF, hypercholesterolemia, thymic enlargement (s/p median sternotomy/total thymectomy 06/19/06), endometriosis (s/p vaginal hysterectomy 06/09/00), right granulosa cell tumor (s/p bilateral salpingo-oophorectomy 07/31/05; s/p chemotherapy '07; recurrence '14 s/p radical debulking including rectosigmoid resection with low rectal anastomosis, resection of right distal ureter, ureteroneocystostomy with psoas hitch, omentectomy 01/19/13; s/p wide local excision of subcutaneous tumor nodule 07/27/13), bladder cancer (s/p TURBT 09/22/07), right THA 05/06/12, Port-a-cath, chemo-induced neuropathy, T&A, appendectomy. BMI is consistent with obesity.  - No PCP per patient.  - Cardiologist is Dr. Daneen Schick. EP cardiologist is Dr. Cristopher Peru. - GI is Dr. Ronnette Juniper.  - HEM-ONC is Dr. Burney Gauze.   Meds include albuterol, ASA 325 mg (on hold), Coreg, Lasix, Ativan, Aldactone, Ambien.   EKG 09/12/17: NSR, possible lateral infarct (age undetermined), inferior infarct (age undetermined).  Echo 06/04/17: Study Conclusions - Left ventricle: The cavity size was normal. Wall thickness was   normal. Systolic function was moderately reduced. The estimated   ejection fraction was in the range of 35% to 40%. Global   hypokinesis with basal to mid inferior akinesis. The study is not   technically  sufficient to allow evaluation of LV diastolic   function. - Aortic valve: Sclerosis without stenosis. There was no   regurgitation. - Mitral valve: Bioprosthetic valve. Leaflets appear calcified with   minimal excursion. There is an increased mean transmitral   gradient of 21 mmHg and pressure 1/2t of 200 msec, suggestive of   severe mitral stenosis. Calculated AVA of 1.1 cm2 at a HR of 95.   There was mild regurgitation. Valve area by pressure half-time:   1.11 cm^2. - Left atrium: Moderately dilated. - Right ventricle: The cavity size was mildly dilated. Mildly   reduced RV systolic function. AICD wire noted in right ventricle. - Right atrium: The atrium was normal in size. AICD wire noted in   right atrium. - Tricuspid valve: There was moderate regurgitation. - Pulmonary arteries: PA peak pressure: 65 mm Hg (S). - Inferior vena cava: The vessel was dilated. The respirophasic   diameter changes were blunted (< 50%), consistent with elevated   central venous pressure. Impressions: - Compared to a prior study in 2016, the LVEF is lower at 35-40%   with global hypokinesis and inferior akinesis. AICD wires are   present. There is a degenerate bioprosthetic mitral valve with   severe stenosis and mild regurgitation. The leafelts are   calcified and poorly mobile. Mean gradient of 21 mmHg (up from 16   mmHg) with calculated AVA of around 1.1 cm2 at HR of 95 bpm,   moderate LAE and moderate pulmonary hypertension with RVSP of 65   mmHg is noted.  Cardiac cath 07/02/17:  Severe mitral prosthesis stenosis with calculated valve area of 1.02 cm square.  This is based upon a mean gradient across the mitral valve of 18.8 mmHg.  No mitral regurgitation is noted by left ventriculography.  Mild pulmonary hypertension with mean pressure 30 mmHg (42/19 mmHg).  Inferobasal dyskinesis with akinesis of the mid and apical inferior wall.  EF 30-35%.  Normal anterior wall motion.  Normal  LVEDP.  Widely patent left main, LAD, and circumflex coronary artery.  The second obtuse marginal contains eccentric proximal 50% narrowing.  Dominant right coronary with mid vessel 80% segmental stenosis with appearance of dissection representing recanalized total occlusion from the culprit event in 2011.  The patient is not having angina and the inferior wall is akinetic.  No indication for PCI in absence of ischemic symptoms. RECOMMENDATIONS:  Continue medical therapy for LV systolic dysfunction  Refer for surgical opinion concerning valve replacement.  Balloon valvotomy in the setting of bioprosthetic valve is not an option due to high risk of valve leaflet tearing and severe mitral regurgitation.  Coronary CT 08/26/17: IMPRESSION: 1. S/P MVR with a 29 mm Edwards-Perimount bovine stented pericardial tissue valve. Leaflets are thickened and have moderate calcifications. Leaflets have severely limited opening with MVA 1.01 cm2. Bioprosthetic mitral valve ring measurements suitable for delivery of a 29 mm Edwards-SAPIEN 3 valve. This is in agreement with suggestions from the Valve in Valve for mitral valve app. 2.  No thrombus was seen in the left atrial appendage. 3. There is a large pseudoaneurysm in the basal and mid inferior wall measuring 55 x 30 mm. 4. Pulmonary artery is dilated measuring 33 x 30 mm suggestive of pulmonary hypertension. 5.  PM leads are seen in the right atrium and right ventricle.  CTA chest/abd/pelvis 08/26/17: IMPRESSION: 1. Vascular findings and measurements pertinent to potential TMVR procedure, as detailed above. 2. Status post median sternotomy for mitral valve replacement with a stented bioprosthesis noted. 3. Aneurysmal dilatation of the left ventricular apex, and profound myocardial thinning in pseudoaneurysm of the inferior wall the left ventricle from the mid ventricle to the base, indicative of prior myocardial infarctions. 4. Trace bilateral  pleural effusions lying dependently. Progressive intraperitoneal metastatic disease in the low anatomic pelvis with developing lymph node metastases as well in the anatomic pelvis, as detailed above. 5. Aortic atherosclerosis, in addition to left anterior descending coronary artery disease. Please note that although the presence of coronary artery calcium documents the presence of coronary artery disease, the severity of this disease and any potential stenosis cannot be assessed on this non-gated CT examination. Assessment for potential risk factor modification, dietary therapy or pharmacologic therapy may be warranted, if clinically indicated. 6. Additional incidental findings, as above.  Preoperative CXR, PFTs, and labs noted. It does not appear that carotid Dopplers were ordered.   If no acute changes then I anticipate that she can proceed as planned. PAT RN notified [employer] ICD representative about planned surgery.  George Hugh [facility] Short Stay Center/Anesthesiology Phone [phone] 09/15/2017 11:33 AM

## 2017-09-16 ENCOUNTER — Inpatient Hospital Stay (HOSPITAL_COMMUNITY): Payer: BLUE CROSS/BLUE SHIELD

## 2017-09-16 ENCOUNTER — Encounter (HOSPITAL_COMMUNITY): Payer: Self-pay | Admitting: *Deleted

## 2017-09-16 ENCOUNTER — Inpatient Hospital Stay (HOSPITAL_COMMUNITY): Payer: BLUE CROSS/BLUE SHIELD | Admitting: Vascular Surgery

## 2017-09-16 ENCOUNTER — Inpatient Hospital Stay (HOSPITAL_COMMUNITY)
Admission: RE | Admit: 2017-09-16 | Discharge: 2017-09-19 | DRG: 267 | Disposition: A | Discharge status: Home / Self Care | Payer: BLUE CROSS/BLUE SHIELD | Source: Ambulatory Visit | Attending: Thoracic Surgery (Cardiothoracic Vascular Surgery) | Admitting: Thoracic Surgery (Cardiothoracic Vascular Surgery)

## 2017-09-16 ENCOUNTER — Inpatient Hospital Stay (HOSPITAL_COMMUNITY): Payer: BLUE CROSS/BLUE SHIELD | Admitting: Anesthesiology

## 2017-09-16 ENCOUNTER — Encounter (HOSPITAL_COMMUNITY)
Admission: RE | Disposition: A | Discharge status: Home / Self Care | Payer: Self-pay | Source: Ambulatory Visit | Attending: Thoracic Surgery (Cardiothoracic Vascular Surgery)

## 2017-09-16 DIAGNOSIS — I255 Ischemic cardiomyopathy: Secondary | ICD-10-CM | POA: Diagnosis present

## 2017-09-16 DIAGNOSIS — Z952 Presence of prosthetic heart valve: Secondary | ICD-10-CM

## 2017-09-16 DIAGNOSIS — Z96641 Presence of right artificial hip joint: Secondary | ICD-10-CM | POA: Diagnosis present

## 2017-09-16 DIAGNOSIS — Z006 Encounter for examination for normal comparison and control in clinical research program: Secondary | ICD-10-CM

## 2017-09-16 DIAGNOSIS — Z8551 Personal history of malignant neoplasm of bladder: Secondary | ICD-10-CM | POA: Diagnosis not present

## 2017-09-16 DIAGNOSIS — D72829 Elevated white blood cell count, unspecified: Secondary | ICD-10-CM | POA: Diagnosis present

## 2017-09-16 DIAGNOSIS — Y831 Surgical operation with implant of artificial internal device as the cause of abnormal reaction of the patient, or of later complication, without mention of misadventure at the time of the procedure: Secondary | ICD-10-CM | POA: Diagnosis present

## 2017-09-16 DIAGNOSIS — Z8543 Personal history of malignant neoplasm of ovary: Secondary | ICD-10-CM | POA: Diagnosis not present

## 2017-09-16 DIAGNOSIS — Z823 Family history of stroke: Secondary | ICD-10-CM

## 2017-09-16 DIAGNOSIS — I272 Pulmonary hypertension, unspecified: Secondary | ICD-10-CM | POA: Diagnosis present

## 2017-09-16 DIAGNOSIS — C786 Secondary malignant neoplasm of retroperitoneum and peritoneum: Secondary | ICD-10-CM | POA: Diagnosis present

## 2017-09-16 DIAGNOSIS — I480 Paroxysmal atrial fibrillation: Secondary | ICD-10-CM | POA: Diagnosis present

## 2017-09-16 DIAGNOSIS — E7800 Pure hypercholesterolemia, unspecified: Secondary | ICD-10-CM | POA: Diagnosis present

## 2017-09-16 DIAGNOSIS — I252 Old myocardial infarction: Secondary | ICD-10-CM | POA: Diagnosis not present

## 2017-09-16 DIAGNOSIS — I472 Ventricular tachycardia: Secondary | ICD-10-CM | POA: Diagnosis not present

## 2017-09-16 DIAGNOSIS — E039 Hypothyroidism, unspecified: Secondary | ICD-10-CM | POA: Diagnosis present

## 2017-09-16 DIAGNOSIS — E785 Hyperlipidemia, unspecified: Secondary | ICD-10-CM | POA: Diagnosis present

## 2017-09-16 DIAGNOSIS — C775 Secondary and unspecified malignant neoplasm of intrapelvic lymph nodes: Secondary | ICD-10-CM | POA: Diagnosis present

## 2017-09-16 DIAGNOSIS — I340 Nonrheumatic mitral (valve) insufficiency: Secondary | ICD-10-CM | POA: Diagnosis not present

## 2017-09-16 DIAGNOSIS — T82857A Stenosis of cardiac prosthetic devices, implants and grafts, initial encounter: Principal | ICD-10-CM | POA: Diagnosis present

## 2017-09-16 DIAGNOSIS — I2510 Atherosclerotic heart disease of native coronary artery without angina pectoris: Secondary | ICD-10-CM | POA: Diagnosis present

## 2017-09-16 DIAGNOSIS — I700 Atherosclerosis of aorta: Secondary | ICD-10-CM | POA: Diagnosis present

## 2017-09-16 DIAGNOSIS — Z95810 Presence of automatic (implantable) cardiac defibrillator: Secondary | ICD-10-CM

## 2017-09-16 DIAGNOSIS — Z8249 Family history of ischemic heart disease and other diseases of the circulatory system: Secondary | ICD-10-CM | POA: Diagnosis not present

## 2017-09-16 DIAGNOSIS — T8209XA Other mechanical complication of heart valve prosthesis, initial encounter: Secondary | ICD-10-CM | POA: Diagnosis present

## 2017-09-16 DIAGNOSIS — D62 Acute posthemorrhagic anemia: Secondary | ICD-10-CM | POA: Diagnosis not present

## 2017-09-16 DIAGNOSIS — Z7982 Long term (current) use of aspirin: Secondary | ICD-10-CM

## 2017-09-16 DIAGNOSIS — I5042 Chronic combined systolic (congestive) and diastolic (congestive) heart failure: Secondary | ICD-10-CM | POA: Diagnosis present

## 2017-09-16 DIAGNOSIS — N183 Chronic kidney disease, stage 3 unspecified: Secondary | ICD-10-CM | POA: Diagnosis present

## 2017-09-16 DIAGNOSIS — J449 Chronic obstructive pulmonary disease, unspecified: Secondary | ICD-10-CM | POA: Diagnosis present

## 2017-09-16 DIAGNOSIS — I5022 Chronic systolic (congestive) heart failure: Secondary | ICD-10-CM | POA: Diagnosis present

## 2017-09-16 DIAGNOSIS — Z954 Presence of other heart-valve replacement: Secondary | ICD-10-CM | POA: Diagnosis not present

## 2017-09-16 DIAGNOSIS — Z953 Presence of xenogenic heart valve: Secondary | ICD-10-CM

## 2017-09-16 DIAGNOSIS — T8209XD Other mechanical complication of heart valve prosthesis, subsequent encounter: Secondary | ICD-10-CM

## 2017-09-16 DIAGNOSIS — J9811 Atelectasis: Secondary | ICD-10-CM

## 2017-09-16 HISTORY — PX: TRANSCATHETER MITRAL VALVE REPLACEMENT, TRANSAPICAL: SHX6559

## 2017-09-16 HISTORY — DX: Presence of prosthetic heart valve: Z95.2

## 2017-09-16 HISTORY — PX: TEE WITHOUT CARDIOVERSION: SHX5443

## 2017-09-16 LAB — POCT I-STAT, CHEM 8
BUN: 9 mg/dL (ref 6–20)
BUN: 8 mg/dL (ref 6–20)
BUN: 9 mg/dL (ref 6–20)
CALCIUM ION: 1.23 mmol/L (ref 1.15–1.40)
CALCIUM ION: 1.22 mmol/L (ref 1.15–1.40)
CHLORIDE: 104 mmol/L (ref 101–111)
CREATININE: 1 mg/dL (ref 0.44–1.00)
Calcium, Ion: 1.23 mmol/L (ref 1.15–1.40)
Chloride: 104 mmol/L (ref 101–111)
Chloride: 104 mmol/L (ref 101–111)
Creatinine, Ser: 1 mg/dL (ref 0.44–1.00)
Creatinine, Ser: 1 mg/dL (ref 0.44–1.00)
GLUCOSE: 91 mg/dL (ref 65–99)
Glucose, Bld: 108 mg/dL — ABNORMAL HIGH (ref 65–99)
Glucose, Bld: 115 mg/dL — ABNORMAL HIGH (ref 65–99)
HCT: 35 % — ABNORMAL LOW (ref 36.0–46.0)
HEMATOCRIT: 32 % — AB (ref 36.0–46.0)
HEMATOCRIT: 34 % — AB (ref 36.0–46.0)
HEMOGLOBIN: 11.9 g/dL — AB (ref 12.0–15.0)
HEMOGLOBIN: 11.6 g/dL — AB (ref 12.0–15.0)
Hemoglobin: 10.9 g/dL — ABNORMAL LOW (ref 12.0–15.0)
POTASSIUM: 3.7 mmol/L (ref 3.5–5.1)
Potassium: 3.7 mmol/L (ref 3.5–5.1)
Potassium: 3.8 mmol/L (ref 3.5–5.1)
SODIUM: 140 mmol/L (ref 135–145)
SODIUM: 141 mmol/L (ref 135–145)
SODIUM: 141 mmol/L (ref 135–145)
TCO2: 27 mmol/L (ref 22–32)
TCO2: 24 mmol/L (ref 22–32)
TCO2: 25 mmol/L (ref 22–32)

## 2017-09-16 LAB — POCT I-STAT 3, ART BLOOD GAS (G3+)
Acid-base deficit: 3 mmol/L — ABNORMAL HIGH (ref 0.0–2.0)
Bicarbonate: 23.7 mmol/L (ref 20.0–28.0)
O2 SAT: 95 %
PCO2 ART: 48.9 mmHg — AB (ref 32.0–48.0)
PO2 ART: 81 mmHg — AB (ref 83.0–108.0)
Patient temperature: 97.9
TCO2: 25 mmol/L (ref 22–32)
pH, Arterial: 7.293 — ABNORMAL LOW (ref 7.350–7.450)

## 2017-09-16 LAB — POCT I-STAT 4, (NA,K, GLUC, HGB,HCT)
Glucose, Bld: 117 mg/dL — ABNORMAL HIGH (ref 65–99)
HCT: 38 % (ref 36.0–46.0)
Hemoglobin: 12.9 g/dL (ref 12.0–15.0)
POTASSIUM: 4.3 mmol/L (ref 3.5–5.1)
SODIUM: 141 mmol/L (ref 135–145)

## 2017-09-16 LAB — PREPARE RBC (CROSSMATCH)

## 2017-09-16 SURGERY — IMPLANTATION, MITRAL VALVE, TRANSCATHETER, TRANSAPICAL APPROACH
Anesthesia: General | Site: Chest

## 2017-09-16 MED ORDER — METOPROLOL TARTRATE 5 MG/5ML IV SOLN: 2.5000 mg | INTRAVENOUS | Status: DC | PRN

## 2017-09-16 MED ORDER — CHLORHEXIDINE GLUCONATE 4 % EX LIQD: 60.0000 mL | Freq: Once | CUTANEOUS | Status: DC

## 2017-09-16 MED ORDER — SODIUM CHLORIDE 0.9 % IV SOLN
INTRAVENOUS | Status: DC | PRN
  Administered 2017-09-16: 1500 mL

## 2017-09-16 MED ORDER — CLOPIDOGREL BISULFATE 75 MG PO TABS
75.0000 mg | ORAL_TABLET | Freq: Every day | ORAL | Status: DC
  Administered 2017-09-17 – 2017-09-18 (×2): 75 mg via ORAL
  Filled 2017-09-16 (×2): qty 1

## 2017-09-16 MED ORDER — FENTANYL CITRATE (PF) 100 MCG/2ML IJ SOLN
INTRAMUSCULAR | Status: DC | PRN
  Administered 2017-09-16: 50 ug via INTRAVENOUS
  Administered 2017-09-16: 25 ug via INTRAVENOUS
  Administered 2017-09-16 (×3): 50 ug via INTRAVENOUS
  Administered 2017-09-16: 25 ug via INTRAVENOUS
  Administered 2017-09-16: 50 ug via INTRAVENOUS

## 2017-09-16 MED ORDER — ACETAMINOPHEN 500 MG PO TABS
1000.0000 mg | ORAL_TABLET | Freq: Four times a day (QID) | ORAL | Status: AC
  Administered 2017-09-16 – 2017-09-18 (×7): 1000 mg via ORAL
  Filled 2017-09-16 (×6): qty 2

## 2017-09-16 MED ORDER — LACTATED RINGERS IV SOLN: 500.0000 mL | Freq: Once | INTRAVENOUS | Status: DC | PRN

## 2017-09-16 MED ORDER — TRAMADOL HCL 50 MG PO TABS
50.0000 mg | ORAL_TABLET | ORAL | Status: DC | PRN
  Administered 2017-09-17 (×2): 100 mg via ORAL
  Filled 2017-09-16 (×2): qty 2

## 2017-09-16 MED ORDER — CHLORHEXIDINE GLUCONATE 0.12 % MT SOLN
15.0000 mL | OROMUCOSAL | Status: AC
  Administered 2017-09-16: 15 mL via OROMUCOSAL

## 2017-09-16 MED ORDER — PROTAMINE SULFATE 10 MG/ML IV SOLN
INTRAVENOUS | Status: DC | PRN
  Administered 2017-09-16: 20 mg via INTRAVENOUS
  Administered 2017-09-16: 50 mg via INTRAVENOUS
  Administered 2017-09-16: 20 mg via INTRAVENOUS

## 2017-09-16 MED ORDER — DEXAMETHASONE SODIUM PHOSPHATE 10 MG/ML IJ SOLN
INTRAMUSCULAR | Status: DC | PRN
  Administered 2017-09-16: 5 mg via INTRAVENOUS

## 2017-09-16 MED ORDER — ASPIRIN 81 MG PO CHEW: 324.0000 mg | CHEWABLE_TABLET | Freq: Every day | ORAL | Status: DC

## 2017-09-16 MED ORDER — POTASSIUM CHLORIDE 10 MEQ/50ML IV SOLN
10.0000 meq | INTRAVENOUS | Status: AC
  Administered 2017-09-16 (×2): 10 meq via INTRAVENOUS
  Filled 2017-09-16 (×2): qty 50

## 2017-09-16 MED ORDER — FENTANYL CITRATE (PF) 100 MCG/2ML IJ SOLN
INTRAMUSCULAR | Status: AC
  Filled 2017-09-16: qty 2

## 2017-09-16 MED ORDER — PROPOFOL 10 MG/ML IV BOLUS
INTRAVENOUS | Status: AC
  Filled 2017-09-16: qty 20

## 2017-09-16 MED ORDER — NITROGLYCERIN IN D5W 200-5 MCG/ML-% IV SOLN: 0.0000 ug/min | INTRAVENOUS | Status: DC

## 2017-09-16 MED ORDER — VANCOMYCIN HCL IN DEXTROSE 1-5 GM/200ML-% IV SOLN
1000.0000 mg | Freq: Once | INTRAVENOUS | Status: AC
  Administered 2017-09-17: 1000 mg via INTRAVENOUS
  Filled 2017-09-16: qty 200

## 2017-09-16 MED ORDER — SODIUM CHLORIDE 0.9 % IV SOLN: INTRAVENOUS | Status: DC

## 2017-09-16 MED ORDER — HYDROMORPHONE HCL 1 MG/ML IJ SOLN
INTRAMUSCULAR | Status: AC
  Filled 2017-09-16: qty 0.5

## 2017-09-16 MED ORDER — MIDAZOLAM HCL 2 MG/2ML IJ SOLN: 2.0000 mg | INTRAMUSCULAR | Status: DC | PRN

## 2017-09-16 MED ORDER — EPINEPHRINE PF 1 MG/ML IJ SOLN
INTRAMUSCULAR | Status: AC
  Filled 2017-09-16: qty 1

## 2017-09-16 MED ORDER — ASPIRIN EC 325 MG PO TBEC: 325.0000 mg | DELAYED_RELEASE_TABLET | Freq: Every day | ORAL | Status: DC

## 2017-09-16 MED ORDER — FENTANYL CITRATE (PF) 100 MCG/2ML IJ SOLN
50.0000 ug | Freq: Once | INTRAMUSCULAR | Status: AC
  Administered 2017-09-16: 50 ug via INTRAVENOUS

## 2017-09-16 MED ORDER — ONDANSETRON HCL 4 MG/2ML IJ SOLN
INTRAMUSCULAR | Status: DC | PRN
  Administered 2017-09-16: 4 mg via INTRAVENOUS

## 2017-09-16 MED ORDER — ONDANSETRON HCL 4 MG/2ML IJ SOLN
INTRAMUSCULAR | Status: AC
  Filled 2017-09-16: qty 2

## 2017-09-16 MED ORDER — NOREPINEPHRINE BITARTRATE 1 MG/ML IV SOLN
INTRAVENOUS | Status: DC | PRN
  Administered 2017-09-16: 2 ug/min via INTRAVENOUS
  Administered 2017-09-16: 5 ug/min via INTRAVENOUS

## 2017-09-16 MED ORDER — SUGAMMADEX SODIUM 200 MG/2ML IV SOLN
INTRAVENOUS | Status: DC | PRN
  Administered 2017-09-16: 200 mg via INTRAVENOUS

## 2017-09-16 MED ORDER — POTASSIUM CHLORIDE 10 MEQ/50ML IV SOLN
INTRAVENOUS | Status: AC
  Administered 2017-09-16: 10 meq
  Filled 2017-09-16: qty 50

## 2017-09-16 MED ORDER — ROCURONIUM BROMIDE 10 MG/ML (PF) SYRINGE
PREFILLED_SYRINGE | INTRAVENOUS | Status: AC
  Filled 2017-09-16: qty 5

## 2017-09-16 MED ORDER — MORPHINE SULFATE (PF) 2 MG/ML IV SOLN
INTRAVENOUS | Status: AC
  Filled 2017-09-16: qty 1

## 2017-09-16 MED ORDER — 0.9 % SODIUM CHLORIDE (POUR BTL) OPTIME
TOPICAL | Status: DC | PRN
  Administered 2017-09-16: 3000 mL

## 2017-09-16 MED ORDER — PROTAMINE SULFATE 10 MG/ML IV SOLN
INTRAVENOUS | Status: AC
Start: 2017-09-16 — End: 2017-09-16
  Filled 2017-09-16: qty 25

## 2017-09-16 MED ORDER — HEPARIN SODIUM (PORCINE) 1000 UNIT/ML IJ SOLN
INTRAMUSCULAR | Status: DC | PRN
  Administered 2017-09-16: 9000 [IU] via INTRAVENOUS

## 2017-09-16 MED ORDER — ROCURONIUM BROMIDE 100 MG/10ML IV SOLN
INTRAVENOUS | Status: DC | PRN
  Administered 2017-09-16: 60 mg via INTRAVENOUS
  Administered 2017-09-16 (×2): 20 mg via INTRAVENOUS

## 2017-09-16 MED ORDER — LACTATED RINGERS IV SOLN
INTRAVENOUS | Status: DC
  Administered 2017-09-16 (×3): via INTRAVENOUS

## 2017-09-16 MED ORDER — PHENYLEPHRINE 40 MCG/ML (10ML) SYRINGE FOR IV PUSH (FOR BLOOD PRESSURE SUPPORT)
PREFILLED_SYRINGE | INTRAVENOUS | Status: AC
  Filled 2017-09-16: qty 10

## 2017-09-16 MED ORDER — LACTATED RINGERS IV SOLN
INTRAVENOUS | Status: DC | PRN
  Administered 2017-09-16: 14:00:00 via INTRAVENOUS

## 2017-09-16 MED ORDER — MIDAZOLAM HCL 2 MG/2ML IJ SOLN
INTRAMUSCULAR | Status: AC
  Filled 2017-09-16: qty 2

## 2017-09-16 MED ORDER — MIDAZOLAM HCL 2 MG/2ML IJ SOLN
1.0000 mg | Freq: Once | INTRAMUSCULAR | Status: AC
  Administered 2017-09-16: 1 mg via INTRAVENOUS

## 2017-09-16 MED ORDER — KETOROLAC TROMETHAMINE 15 MG/ML IJ SOLN
15.0000 mg | Freq: Four times a day (QID) | INTRAMUSCULAR | Status: AC
  Administered 2017-09-16 – 2017-09-17 (×5): 15 mg via INTRAVENOUS
  Filled 2017-09-16 (×4): qty 1

## 2017-09-16 MED ORDER — PROPOFOL 10 MG/ML IV BOLUS
INTRAVENOUS | Status: DC | PRN
  Administered 2017-09-16: 120 mg via INTRAVENOUS

## 2017-09-16 MED ORDER — SODIUM CHLORIDE 0.9 % IV SOLN
0.0000 ug/min | INTRAVENOUS | Status: DC
  Filled 2017-09-16: qty 2

## 2017-09-16 MED ORDER — SODIUM CHLORIDE 0.9 % IV SOLN: 1.0000 mL/kg/h | INTRAVENOUS | Status: DC

## 2017-09-16 MED ORDER — ONDANSETRON HCL 4 MG/2ML IJ SOLN
4.0000 mg | Freq: Four times a day (QID) | INTRAMUSCULAR | Status: DC | PRN
  Administered 2017-09-18: 4 mg via INTRAVENOUS
  Filled 2017-09-16: qty 2

## 2017-09-16 MED ORDER — OXYCODONE HCL 5 MG PO TABS
5.0000 mg | ORAL_TABLET | ORAL | Status: DC | PRN
  Administered 2017-09-16 – 2017-09-18 (×7): 10 mg via ORAL
  Administered 2017-09-19: 5 mg via ORAL
  Administered 2017-09-19: 10 mg via ORAL
  Filled 2017-09-16 (×4): qty 2
  Filled 2017-09-16: qty 1
  Filled 2017-09-16 (×4): qty 2

## 2017-09-16 MED ORDER — SODIUM CHLORIDE 0.9 % IV SOLN
INTRAVENOUS | Status: AC
  Filled 2017-09-16 (×3): qty 1.2

## 2017-09-16 MED ORDER — FENTANYL CITRATE (PF) 250 MCG/5ML IJ SOLN
INTRAMUSCULAR | Status: AC
  Filled 2017-09-16: qty 5

## 2017-09-16 MED ORDER — CEFAZOLIN SODIUM-DEXTROSE 2-4 GM/100ML-% IV SOLN
2.0000 g | Freq: Three times a day (TID) | INTRAVENOUS | Status: AC
  Administered 2017-09-16 – 2017-09-18 (×6): 2 g via INTRAVENOUS
  Filled 2017-09-16 (×7): qty 100

## 2017-09-16 MED ORDER — SODIUM CHLORIDE 0.9 % IV SOLN
0.0500 ug/kg/min | INTRAVENOUS | Status: DC
  Administered 2017-09-16: 0.1 ug/kg/min via INTRAVENOUS
  Filled 2017-09-16: qty 5000

## 2017-09-16 MED ORDER — CHLORHEXIDINE GLUCONATE 0.12 % MT SOLN
15.0000 mL | Freq: Once | OROMUCOSAL | Status: AC
  Administered 2017-09-16: 15 mL via OROMUCOSAL
  Filled 2017-09-16: qty 15

## 2017-09-16 MED ORDER — ALBUMIN HUMAN 5 % IV SOLN: 250.0000 mL | INTRAVENOUS | Status: DC | PRN

## 2017-09-16 MED ORDER — FAMOTIDINE IN NACL 20-0.9 MG/50ML-% IV SOLN
20.0000 mg | Freq: Two times a day (BID) | INTRAVENOUS | Status: AC
  Administered 2017-09-17: 20 mg via INTRAVENOUS
  Filled 2017-09-16: qty 50

## 2017-09-16 MED ORDER — DEXAMETHASONE SODIUM PHOSPHATE 10 MG/ML IJ SOLN
INTRAMUSCULAR | Status: AC
  Filled 2017-09-16: qty 1

## 2017-09-16 MED ORDER — SODIUM CHLORIDE 0.9 % IV SOLN
INTRAVENOUS | Status: DC
  Administered 2017-09-16: 18:00:00 via INTRAVENOUS

## 2017-09-16 MED ORDER — HYDROMORPHONE HCL 1 MG/ML IJ SOLN
INTRAMUSCULAR | Status: DC | PRN
  Administered 2017-09-16: 0.5 mg via INTRAVENOUS

## 2017-09-16 MED ORDER — MORPHINE SULFATE (PF) 2 MG/ML IV SOLN
1.0000 mg | INTRAVENOUS | Status: DC | PRN
  Administered 2017-09-16 (×3): 2 mg via INTRAVENOUS
  Filled 2017-09-16 (×2): qty 1

## 2017-09-16 SURGICAL SUPPLY — 97 items
ADH SKN CLS APL DERMABOND .7 (GAUZE/BANDAGES/DRESSINGS) ×6
BAG DECANTER FOR FLEXI CONT (MISCELLANEOUS) ×1 IMPLANT
BAG SNAP BAND KOVER 36X36 (MISCELLANEOUS) ×6 IMPLANT
BLADE CLIPPER SURG (BLADE) IMPLANT
BLADE STERNUM SYSTEM 6 (BLADE) ×2 IMPLANT
CABLE ADAPT CONN TEMP 6FT (ADAPTER) ×3 IMPLANT
CANISTER SUCT 3000ML PPV (MISCELLANEOUS) ×1 IMPLANT
CANNULA FEM VENOUS REMOTE 22FR (CANNULA) IMPLANT
CANNULA OPTISITE PERFUSION 16F (CANNULA) IMPLANT
CANNULA OPTISITE PERFUSION 18F (CANNULA) IMPLANT
CATH ANGIO 5F BER2 65CM (CATHETERS) ×1 IMPLANT
CATH DIAG EXPO 6F VENT PIG 145 (CATHETERS) ×6 IMPLANT
CATH EXPO 5FR AL1 (CATHETERS) ×3 IMPLANT
CATH INFINITI 6F AL2 (CATHETERS) ×3 IMPLANT
CATH S G BIP PACING (SET/KITS/TRAYS/PACK) ×3 IMPLANT
CLIP VESOCCLUDE MED 24/CT (CLIP) ×3 IMPLANT
CLIP VESOCCLUDE SM WIDE 24/CT (CLIP) ×3 IMPLANT
CONT SPEC 4OZ CLIKSEAL STRL BL (MISCELLANEOUS) ×4 IMPLANT
COVER BACK TABLE 80X110 HD (DRAPES) ×3 IMPLANT
COVER DOME SNAP 22 D (MISCELLANEOUS) ×6 IMPLANT
CRADLE DONUT ADULT HEAD (MISCELLANEOUS) ×3 IMPLANT
DERMABOND ADVANCED (GAUZE/BANDAGES/DRESSINGS) ×3
DERMABOND ADVANCED .7 DNX12 (GAUZE/BANDAGES/DRESSINGS) ×2 IMPLANT
DEVICE CLOSURE PERCLS PRGLD 6F (VASCULAR PRODUCTS) ×4 IMPLANT
DRAPE HALF SHEET 40X57 (DRAPES) ×1 IMPLANT
DRAPE INCISE IOBAN 66X45 STRL (DRAPES) IMPLANT
DRSG TEGADERM 4X4.75 (GAUZE/BANDAGES/DRESSINGS) ×3 IMPLANT
ELECT CAUTERY BLADE 6.4 (BLADE) ×2 IMPLANT
ELECT REM PT RETURN 9FT ADLT (ELECTROSURGICAL) ×6
ELECTRODE REM PT RTRN 9FT ADLT (ELECTROSURGICAL) ×4 IMPLANT
FELT TEFLON 1X6 (MISCELLANEOUS) ×2 IMPLANT
FELT TEFLON 6X6 (MISCELLANEOUS) ×2 IMPLANT
FEMORAL VENOUS CANN RAP (CANNULA) IMPLANT
GAUZE SPONGE 4X4 12PLY STRL (GAUZE/BANDAGES/DRESSINGS) ×3 IMPLANT
GLOVE BIO SURGEON STRL SZ7.5 (GLOVE) ×2 IMPLANT
GLOVE BIO SURGEON STRL SZ8 (GLOVE) ×5 IMPLANT
GLOVE EUDERMIC 7 POWDERFREE (GLOVE) ×2 IMPLANT
GLOVE ORTHO TXT STRL SZ7.5 (GLOVE) ×3 IMPLANT
GOWN STRL REUS W/ TWL LRG LVL3 (GOWN DISPOSABLE) ×6 IMPLANT
GOWN STRL REUS W/ TWL XL LVL3 (GOWN DISPOSABLE) ×8 IMPLANT
GOWN STRL REUS W/TWL LRG LVL3 (GOWN DISPOSABLE) ×9
GOWN STRL REUS W/TWL XL LVL3 (GOWN DISPOSABLE) ×12
GUIDEWIRE SAF TJ AMPL .035X180 (WIRE) ×4 IMPLANT
GUIDEWIRE SAFE TJ AMPLATZ EXST (WIRE) ×3 IMPLANT
GUIDEWIRE STRAIGHT .035 260CM (WIRE) ×3 IMPLANT
INSERT FOGARTY SM (MISCELLANEOUS) IMPLANT
KIT BASIN OR (CUSTOM PROCEDURE TRAY) ×3 IMPLANT
KIT DILATOR VASC 18G NDL (KITS) IMPLANT
KIT HEART LEFT (KITS) ×3 IMPLANT
KIT ROOM TURNOVER OR (KITS) ×3 IMPLANT
KIT SUCTION CATH 14FR (SUCTIONS) ×6 IMPLANT
LOOP VESSEL MAXI BLUE (MISCELLANEOUS) IMPLANT
LOOP VESSEL MINI RED (MISCELLANEOUS) IMPLANT
NDL PERC 18GX7CM (NEEDLE) ×2 IMPLANT
NEEDLE PERC 18GX7CM (NEEDLE) ×6 IMPLANT
NS IRRIG 1000ML POUR BTL (IV SOLUTION) ×9 IMPLANT
PACK ENDOVASCULAR (PACKS) ×3 IMPLANT
PAD ARMBOARD 7.5X6 YLW CONV (MISCELLANEOUS) ×6 IMPLANT
PAD ELECT DEFIB RADIOL ZOLL (MISCELLANEOUS) ×3 IMPLANT
PENCIL BUTTON HOLSTER BLD 10FT (ELECTRODE) ×3 IMPLANT
PERCLOSE PROGLIDE 6F (VASCULAR PRODUCTS) ×3
SET MICROPUNCTURE 5F STIFF (MISCELLANEOUS) ×3 IMPLANT
SHEATH BRITE TIP 6FR 35CM (SHEATH) ×1 IMPLANT
SHEATH PINNACLE 6F 10CM (SHEATH) ×7 IMPLANT
SHEATH PINNACLE 8F 10CM (SHEATH) ×3 IMPLANT
SLEEVE REPOSITIONING LENGTH 30 (MISCELLANEOUS) ×4 IMPLANT
SPONGE LAP 4X18 X RAY DECT (DISPOSABLE) ×3 IMPLANT
STOPCOCK MORSE 400PSI 3WAY (MISCELLANEOUS) ×6 IMPLANT
SUT ETHIBOND NAB MH 2-0 36IN (SUTURE) ×2 IMPLANT
SUT ETHIBOND X763 2 0 SH 1 (SUTURE) ×1 IMPLANT
SUT GORETEX CV 4 TH 22 36 (SUTURE) IMPLANT
SUT GORETEX CV4 TH-18 (SUTURE) IMPLANT
SUT MNCRL AB 3-0 PS2 18 (SUTURE) ×1 IMPLANT
SUT PROLENE 3 0 SH1 36 (SUTURE) ×1 IMPLANT
SUT PROLENE 5 0 C 1 36 (SUTURE) IMPLANT
SUT PROLENE 6 0 C 1 30 (SUTURE) IMPLANT
SUT SILK  1 MH (SUTURE) ×2
SUT SILK 1 MH (SUTURE) ×2 IMPLANT
SUT VIC AB 2-0 CT1 27 (SUTURE)
SUT VIC AB 2-0 CT1 TAPERPNT 27 (SUTURE) IMPLANT
SUT VIC AB 2-0 CTX 36 (SUTURE) IMPLANT
SUT VIC AB 3-0 SH 8-18 (SUTURE) ×2 IMPLANT
SUT VICRYL 2 TP 1 (SUTURE) ×1 IMPLANT
SYR 50ML LL SCALE MARK (SYRINGE) ×3 IMPLANT
SYR BULB IRRIGATION 50ML (SYRINGE) ×1 IMPLANT
SYR CONTROL 10ML LL (SYRINGE) IMPLANT
TOWEL GREEN STERILE (TOWEL DISPOSABLE) ×6 IMPLANT
TOWEL OR NON WOVEN STRL DISP B (DISPOSABLE) ×2 IMPLANT
TRANSDUCER DISP STR W/STOPCOCK (MISCELLANEOUS) ×1 IMPLANT
TRANSDUCER W/STOPCOCK (MISCELLANEOUS) ×6 IMPLANT
TRAY FOLEY SILVER 14FR TEMP (SET/KITS/TRAYS/PACK) ×3 IMPLANT
TUBE SUCT INTRACARD DLP 20F (MISCELLANEOUS) IMPLANT
VALVE HRT TRANSCATH CERT 29MM (Valve) ×1 IMPLANT
WIRE .035 3MM-J 145CM (WIRE) ×3 IMPLANT
WIRE AMPLATZ SS-J .035X180CM (WIRE) ×3 IMPLANT
WIRE BENTSON .035X145CM (WIRE) ×3 IMPLANT
YANKAUER SUCT BULB TIP NO VENT (SUCTIONS) ×3 IMPLANT

## 2017-09-16 NOTE — Transfer of Care (Signed)
Immediate Anesthesia Transfer of Care Note  Patient: [name]  Procedure(s) Performed: TRANSCATHETER MITRAL VALVE REPLACEMENT,TRANSAPICAL (N/A Chest) TRANSESOPHAGEAL ECHOCARDIOGRAM (TEE) (N/A )  Patient Location: SICU  Anesthesia Type:General  Level of Consciousness: awake and alert   Airway & Oxygen Therapy: Patient Spontanous Breathing and Patient connected to nasal cannula oxygen  Post-op Assessment: Report given to RN, Post -op Vital signs reviewed and stable and Patient moving all extremities X 4  Post vital signs: Reviewed and stable  Last Vitals:  Vitals Value Taken Time  BP 135/95 09/16/2017  5:12 PM  Temp    Pulse 71 09/16/2017  5:16 PM  Resp 15 09/16/2017  5:16 PM  SpO2 100 % 09/16/2017  5:16 PM  Vitals shown include unvalidated device data.  Last Pain:  Vitals:   09/16/17 1049  TempSrc:   PainSc: 0-No pain         Complications: No apparent anesthesia complications

## 2017-09-16 NOTE — Anesthesia Procedure Notes (Signed)
Arterial Line Insertion Start/End3/26/2019 12:00 PM, 09/16/2017 12:18 PM Performed by: Josephine Igo, CRNA, CRNA  Patient location: Pre-op. Preanesthetic checklist: patient identified, IV checked, risks and benefits discussed, surgical consent and monitors and equipment checked Lidocaine 1% used for infiltration Left, radial was placed Catheter size: 20 G Hand hygiene performed , maximum sterile barriers used  and Seldinger technique used  Attempts: 1 Procedure performed without using ultrasound guided technique. Following insertion, dressing applied and Biopatch. Post procedure assessment: normal  Patient tolerated the procedure well with no immediate complications.

## 2017-09-16 NOTE — Anesthesia Procedure Notes (Addendum)

## 2017-09-16 NOTE — Op Note (Addendum)
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 Roxy Manns, MD and [name], MD  Anesthesiologist:  Arabella Merles, MD  Echocardiographer:  Ena Dawley, MD  Pre-operative Echo Findings:  Prosthetic valve dysfunction of bioprosthetic mitral valve   Severe mitral stenosis  Mild mitral regurgitation  Moderate left ventricular systolic dysfunction  Post-operative Echo Findings:  Trivial paravalvular leak  Unchanged left ventricular systolic function   BRIEF CLINICAL NOTE AND INDICATIONS FOR SURGERY  Patient is a 57 year old female with history of coronary artery disease status post acute inferior wall myocardial infarction in 0630 complicated by postinfarction papillary muscle rupture and left ventricular aneurysm who underwent urgent repair of her aneurysm with mitral valve replacement using a bioprosthetic tissue valve, ventricular tachycardia status post ICD implantation, ischemic cardiomyopathy, and chronic systolic congestive heart failure who has been referred for surgical consultation to discuss treatment options for management of prosthetic valve dysfunction with severe symptomatic mitral stenosis.  The patient's history of cardiac disease dates back to April 2011 when she was on vacation at [facility] and developed acute congestive heart failure.  She was diagnosed with severe acute mitral regurgitation secondary to postinfarction papillary muscle rupture and underwent urgent surgical intervention including mitral valve replacement using a 29  millimeter Edwards Perimount bovine stented pericardial tissue valve and repair of aneurysm of the inferior left ventricular wall.  A portion of saphenous vein was removed from the patient's left thigh although coronary artery bypass grafting was not performed.  The patient was told that the entire inferior wall was necrotic.  Her postoperative recovery was notable for recurrent ventricular tachycardia for which she underwent implantation of an ICD.  She slowly recovered but has remained stable from a cardiac standpoint ever since.  During the interim the patient has developed recurrent ovarian cancer with multiple intra-abdominal metastases treated with surgical resection on 2 previous occasions in 2014 and 2015.  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  She has been followed locally by Dr. Tamala Julian and Dr. Lovena Le for management of chronic systolic congestive heart failure and indwelling ICD.    Patient states that she has had long-standing symptoms of mild exertional shortness of breath as well as chronic pain in her muscles and joints related to her underlying malignancy.  She is not very active physically.  In early December 2018 she was hospitalized with pancolitis.  At the time she had acute exacerbation of shortness of breath, and follow-up transthoracic echocardiogram revealed severe mitral stenosis with mean transvalvular gradient estimated 21 mmHg across the bioprosthetic valve in the mitral position.  Left ventricular ejection fraction was reported to have drop slightly to 35-40% with global hypokinesis and inferior wall akinesis.  Symptoms improved with medical therapy, but she has continued to report moderate symptoms of exertional shortness of breath.  The patient was seen in follow-up by Dr. Tamala Julian and underwent left and right heart catheterization on July 02, 2017.  She was found to have severe  prosthetic valve dysfunction with severe mitral stenosis.  Mean transvalvular gradient across the mitral valve was estimated  at 18.8 mmHg, corresponding to mitral valve area calculated 1.02 cm.  There was mild pulmonary hypertension.  There was inferobasal dyskinesis with akinesis of the mid and apical inferior wall.  Ejection fraction was estimated 30-35%.  There was normal left ventricular end-diastolic pressure.  There was 80% stenosis of the mid right coronary artery with nonobstructive disease in the left coronary system.  Cardiothoracic surgical consultation was requested.  During the course of the patient's preoperative work up they have been evaluated comprehensively by a multidisciplinary team of specialists coordinated through the [facility] in the [facility].  Based upon review of all of the patient's preoperative diagnostic tests they are felt to be candidate for valve-in-valve transcatheter mitral valve replacement using the transapical approach as an alternative to high risk conventional surgery.    Following the decision to proceed with surgery, a discussion has been held regarding what types of management strategies would be attempted intraoperatively in the event of life-threatening complications, including whether or not the patient would be considered a candidate for the use of cardiopulmonary bypass and/or conversion to open sternotomy for attempted surgical intervention.  The patient has been advised of a variety of complications that might develop peculiar to this approach including but not limited to risks of death, stroke, paravalvular leak, aortic dissection or other major vascular complications, aortic annulus rupture, device embolization, cardiac rupture or perforation, acute myocardial infarction, arrhythmia, heart block or bradycardia requiring permanent pacemaker placement, congestive heart failure, respiratory failure, renal  failure, pneumonia, infection, other late complications related to structural valve deterioration or migration, or other complications that might ultimately cause a temporary or permanent loss of functional independence or other long term morbidity.  The patient provides full informed consent for the procedure as described and all questions were answered preoperatively.    DETAILS OF THE OPERATIVE PROCEDURE  PREPARATION:    The patient is brought to the operating room on the above mentioned date and central monitoring was established by the anesthesia team including placement of Swan-Ganz catheter and radial arterial line. The patient is placed in the supine position on the operating table.  Intravenous antibiotics are administered. General endotracheal anesthesia is induced uneventfully. A Foley catheter is placed.  Baseline transesophageal echocardiogram was performed.  Findings were notable for severe calcification with restricted leaflet mobility of the patient's bioprosthetic tissue valve in the mitral position causing severe mitral stenosis.  Mean transvalvular gradient was estimated between 11 and 13 mmHg.  There was mild central mitral regurgitation.  There was moderate left ventricular systolic dysfunction with akinesis of the inferior wall of the left ventricle.  The patient's chest, abdomen, both groins, and both lower extremities are prepared and draped in a sterile manner. A time out procedure is performed.   PERIPHERAL ACCESS:    Femoral arterial and venous access is obtained with placement of 6 Fr arterial and venous sheaths on the right side and a second long 6 Fr venous sheath on the left side.    A temporary transvenous pacemaker catheter is passed through the left femoral venous sheath under fluoroscopic guidance into the right ventricle.  The pacemaker is tested to ensure stable lead placement and pacemaker capture.   TRANSAPICAL ACCESS:   The location of the left  ventricular apex is confirmed using flouroscopy, and a miniature left thoracotomy incision is made directly over the left ventricular apex.  The left pleural space is entered.  No adhesions are encountered.  The ribs  gently spread to expose the pericardial surface.  A longitudinal incision is made in the pericardium and the left ventricular apex inspected.  There are moderate intrapericardial adhesions.  The surface of the left ventricular apex appears quite thin and friable.  An appropriate site for left ventricular sheath placement is chosen well lateral to the left anterior descending coronary artery and verified using TEE.  Two pursestring sutures are placed using pledgeted 2-0 Prolene suture in a double triangle orientation, incorporating the overlying pericardium into the sutures so as to buttress the closure at the end of the procedure.  The patient is heparinized systemically and ACT verified > 250 seconds.  The left ventricular apex is punctured using an 18 gauge needle and a soft J-tipped guidewire is passed into the left ventricle and through the mitral valve under fluoroscopic guidance.  A 6 Fr sheath is placed over the guidewire and across the mitral valve.  A JR-4 catheter is passed through the sheath and maneuvered into the right superior pulmonary vein under fluoroscopic guidance.  An Amplatz extra stiff guidewire is passed through the JR-4 catheter into the right superior pulmonary vein and both the JR-4 catheter and the introducing sheath are removed.  An [place] introducer sheath is passed over the guidewire into the left ventricle until the tip of the sheath crosses the mitral prosthesis.  The sheath position is continuously monitored and secured by the surgical assistant.     TRANSCATHETER HEART VALVE DEPLOYMENT:  An Edwards Sapien 3 transcatheter heart valve (size 29 mm, model # 9600TFX, serial #[id number]) is prepared and crimped per manufacturer's guidelines, and the  proper orientation of the valve is confirmed on the EdwardsCertitude delivery system.  The delivery system loader is advanced into the introducing sheath.  The valve and delivery system are advanced through the loader into the sheath and all air is evacuated.  The valve and balloon are advanced into the left ventricle and part way through the mitral valve.  The introducing sheath is pulled back into the left ventricle.  The valve is then finely positioned within the mitral valve.   Once final position of the valve has been confirmed, the valve is deployed while temporarily holding ventilation and during rapid ventricular pacing to maintain systolic blood pressure < 50 mmHg and pulse pressure < 10 mmHg.  The balloon inflation is held for >3 seconds after reaching full deployment volume.  Once the balloon has fully deflated the balloon is retracted into the left ventricle and valve function is assessed using TEE.   There is felt to be trivial paravalvular leak and no central mitral regurgitation.  Left ventricular function is unchanged from preoperatively.  The patient's hemodynamic recovery following valve deployment is rapid and uneventful.  The deployment balloon and guidewire are both removed.   PROCEDURE COMPLETION:  The left ventricular sheath is removed during rapid ventricular pacing to maintain systolic blood pressure < 70 mmHg while the pursestring sutures are tied.  The apical closure is inspected and notably intact.  Protamine is administered.    Once hemostasis has been ascertained, the left pleural space is drained using a single 28 Fr Bard chest tube and the mini thoracotomy incision is closed in layers and the skin incision closed using a subcuticular skin closure.   The temporary pacemaker and all femoral sheaths are removed.  The patient tolerated the procedure well and is transported to the surgical intensive care in stable condition. There are no intraoperative complications. All sponge  instrument and needle counts are verified correct at completion of the operation.  No blood products were administered during the operation.     Valentina Gu. Roxy Manns MD 09/16/2017 4:29 PM

## 2017-09-16 NOTE — Op Note (Addendum)
CARDIOLOGY OPERATIVE NOTE  Date of Procedure:  09/16/2017  Preoperative Diagnosis: Severe bioprosthetic mitral valve stenosis  Postoperative Diagnosis: Same   Procedure:    VALVE IN VALVE Transcatheter Mitral Valve Replacement - Transapical Approach  Edwards Sapien 3 THV (size 29 mm, model # 9300TFX, serial # [id number])   Co-Surgeons:  Valentina Gu. Roxy Manns, MD and [name], MD  Anesthesiologist:  Dr Ola Spurr  Echocardiographer:  Dr Meda Coffee  Pre-operative Echo Findings:  Severe bioprosthetic mitral stenosis  Normal left ventricular systolic function  Post-operative Echo Findings:  No paravalvular leak  Normal left ventricular systolic function  BRIEF CLINICAL NOTE AND INDICATIONS FOR SURGERY  DETAILS OF THE OPERATIVE PROCEDURE Please see the complete operative note of Dr Roxy Manns.  PREPARATION:    The patient is brought to the operating room on the above mentioned date and central monitoring was established by the anesthesia team including placement of a Right IJ central line and radial arterial line. The patient is placed in the supine position on the operating table.  Intravenous antibiotics are administered. General endotracheal anesthesia is induced uneventfully. A Foley catheter is placed.  Baseline transesophageal echocardiogram was performed. The patient's chest, abdomen, both groins, and both lower extremities are prepared and draped in a sterile manner. A time out procedure is performed.   PERIPHERAL ACCESS:    Femoral arterial and venous access is obtained with placement of 6 Fr arterial and venous sheaths on the right side.  Ultrasound guidance is used. Ultrasound images are captured and stored in the patient's paper chart. An additional venous sheath is inserted into the left femoral vein under ultrasound guidance.  A temporary transvenous pacemaker catheter is passed through the left femoral venous sheath under fluoroscopic guidance into the right ventricle.  The  pacemaker is tested to ensure stable lead placement and pacemaker capture.   TRANSAPICAL ACCESS:  See the note of Dr Cherre Blanc MITRAL VALVULOPLASTY: Not performed   TRANSCATHETER HEART VALVE DEPLOYMENT: An Edwards Sapien 3 transcatheter heart valve (size 29 mm, model # 9300TFX, serial #[id number]) is prepared and crimped per manufacturer's guidelines, and the proper orientation of the valve is confirmed on the [place] Certitude delivery system.  The delivery system loader is advanced into the introducing sheath.  The valve and delivery system are advanced through the loader into the sheath and all air is evacuated.  The valve and balloon are advanced into the left ventricle and across the bioprosthetic mitral valve.  The sheath is pulled back and the trascatheter valve is then finely positioned in the mitral valve. Once final position of the valve has been confirmed, the valve is deployed while temporarily holding ventilation and during rapid ventricular pacing to maintain systolic blood pressure < 50 mmHg and pulse pressure < 10 mmHg.  The balloon inflation is held for >3 seconds after reaching full deployment volume.  Once the balloon has fully deflated the balloon is retracted into the left ventricle and valve function is assessed using TEE. There is felt to be no paravalvular leak and no central mital insufficiency.  Left ventricular function is unchanged from preoperatively. The patient's hemodynamic recovery following valve deployment is rapid.  PROCEDURE COMPLETION: See Dr Guy Sandifer note for details of the LV apical closure.    The temporary pacemaker, pigtail catheters and all femoral sheaths are removed. The right femoral arterial sheath is closed with a Perclose device.   The patient tolerated the procedure well and is transported to the surgical intensive care in stable condition.  There are no intraoperative complications. All sponge instrument and needle counts are verified correct at  completion of the operation.  No blood products were administered during the operation.  The patient received a total of 0 mL of intravenous contrast during the procedure.  [name] MD 09/16/2017 4:58 PM

## 2017-09-16 NOTE — Progress Notes (Signed)
Dr. Roxy Manns at bedside d/t c/o chestpain.  New order given for and initiated.  Lab result also given.  Will continue to monitor.

## 2017-09-16 NOTE — Progress Notes (Signed)
      [address]       [place] [address]             [phone]      S/p transapical valve-in-valve TMVR  Extubated. C/o incisional pain  BP 107/74   Pulse 66   Temp 98.1 F (36.7 C) (Oral)   Resp 15   Ht 5\' 6"  (1.676 m)   Wt 194 lb (88 kg)   SpO2 97%   BMI 31.31 kg/m   Intake/Output Summary (Last 24 hours) at 09/16/2017 1827 Last data filed at 09/16/2017 1639 Gross per 24 hour  Intake 1700 ml  Output 350 ml  Net 1350 ml   Doing well early postop  Remo Lipps C. Roxan Hockey, MD [facility] [phone]

## 2017-09-16 NOTE — Anesthesia Postprocedure Evaluation (Signed)
Anesthesia Post Note  Patient: [name]  Procedure(s) Performed: TRANSCATHETER MITRAL VALVE REPLACEMENT,TRANSAPICAL (N/A Chest) TRANSESOPHAGEAL ECHOCARDIOGRAM (TEE) (N/A )     Patient location during evaluation: PACU Anesthesia Type: General Level of consciousness: awake and alert Pain management: pain level controlled Vital Signs Assessment: post-procedure vital signs reviewed and stable Respiratory status: spontaneous breathing, nonlabored ventilation, respiratory function stable and patient connected to nasal cannula oxygen Cardiovascular status: blood pressure returned to baseline and stable Postop Assessment: no apparent nausea or vomiting Anesthetic complications: no    Last Vitals:  Vitals:   09/16/17 1845 09/16/17 1900  BP: (!) 96/53 103/66  Pulse: 70 71  Resp: 11 17  Temp:    SpO2: 98% 99%    Last Pain:  Vitals:   09/16/17 1049  TempSrc:   PainSc: 0-No pain                 [name] DAVID

## 2017-09-16 NOTE — Interval H&P Note (Signed)
History and Physical Interval Note:  09/16/2017 1:41 PM  [name]  has presented today for surgery, with the diagnosis of Prosthetic Valve Dysfunction  The various methods of treatment have been discussed with the patient and family. After consideration of risks, benefits and other options for treatment, the patient has consented to  Procedure(s): TRANSCATHETER MITRAL VALVE REPLACEMENT,TRANSAPICAL (N/A) TRANSESOPHAGEAL ECHOCARDIOGRAM (TEE) (N/A) as a surgical intervention .  The patient's history has been reviewed, patient examined, no change in status, stable for surgery.  I have reviewed the patient's chart and labs.  Questions were answered to the patient's satisfaction.     [name]

## 2017-09-16 NOTE — OR Nursing (Signed)
Twenty minute call to SICU charge nurse at 1622. Spoke to [place]. Cath Lab also notified of timing.

## 2017-09-16 NOTE — OR Nursing (Signed)
Forty-five minute call to SICU charge nurse at 1557. Spoke to [place].

## 2017-09-16 NOTE — Anesthesia Procedure Notes (Signed)
Central Venous Catheter Insertion Performed by: Roderic Palau, MD, anesthesiologist Start/End3/26/2019 11:49 AM, 09/16/2017 11:59 AM Patient location: Pre-op. Preanesthetic checklist: patient identified, IV checked, site marked, risks and benefits discussed, surgical consent, monitors and equipment checked, pre-op evaluation, timeout performed and anesthesia consent Position: Trendelenburg Lidocaine 1% used for infiltration and patient sedated Hand hygiene performed , maximum sterile barriers used  and Seldinger technique used Catheter size: 8 Fr Total catheter length 16. Central line was placed.Double lumen Procedure performed using ultrasound guided technique. Ultrasound Notes:anatomy identified, needle tip was noted to be adjacent to the nerve/plexus identified, no ultrasound evidence of intravascular and/or intraneural injection and image(s) printed for medical record Attempts: 1 Following insertion, dressing applied, line sutured and Biopatch. Post procedure assessment: blood return through all ports  Patient tolerated the procedure well with no immediate complications.

## 2017-09-17 ENCOUNTER — Encounter (HOSPITAL_COMMUNITY): Payer: Self-pay | Admitting: Cardiovascular Disease

## 2017-09-17 ENCOUNTER — Inpatient Hospital Stay (HOSPITAL_COMMUNITY): Payer: BLUE CROSS/BLUE SHIELD

## 2017-09-17 ENCOUNTER — Other Ambulatory Visit: Payer: Self-pay

## 2017-09-17 DIAGNOSIS — Z952 Presence of prosthetic heart valve: Secondary | ICD-10-CM

## 2017-09-17 DIAGNOSIS — I472 Ventricular tachycardia: Secondary | ICD-10-CM

## 2017-09-17 DIAGNOSIS — Z006 Encounter for examination for normal comparison and control in clinical research program: Secondary | ICD-10-CM

## 2017-09-17 DIAGNOSIS — I340 Nonrheumatic mitral (valve) insufficiency: Secondary | ICD-10-CM

## 2017-09-17 DIAGNOSIS — T8209XD Other mechanical complication of heart valve prosthesis, subsequent encounter: Secondary | ICD-10-CM

## 2017-09-17 LAB — ECHOCARDIOGRAM LIMITED
Height: 66 in
Weight: 3209.9 oz

## 2017-09-17 LAB — BASIC METABOLIC PANEL
ANION GAP: 10 (ref 5–15)
BUN: 13 mg/dL (ref 6–20)
CO2: 21 mmol/L — ABNORMAL LOW (ref 22–32)
Calcium: 8.5 mg/dL — ABNORMAL LOW (ref 8.9–10.3)
Chloride: 104 mmol/L (ref 101–111)
Creatinine, Ser: 1.14 mg/dL — ABNORMAL HIGH (ref 0.44–1.00)
GFR calc Af Amer: >60 mL/min (ref >60)
GFR calc non Af Amer: 52 mL/min — ABNORMAL LOW (ref >60)
Glucose, Bld: 130 mg/dL — ABNORMAL HIGH (ref 65–99)
POTASSIUM: 4.7 mmol/L (ref 3.5–5.1)
SODIUM: 135 mmol/L (ref 135–145)

## 2017-09-17 LAB — CBC
HCT: 35.4 % — ABNORMAL LOW (ref 36.0–46.0)
Hemoglobin: 11.4 g/dL — ABNORMAL LOW (ref 12.0–15.0)
MCH: 29 pg (ref 26.0–34.0)
MCHC: 32.2 g/dL (ref 30.0–36.0)
MCV: 90.1 fL (ref 78.0–100.0)
PLATELETS: 110 10*3/uL — AB (ref 150–400)
RBC: 3.93 MIL/uL (ref 3.87–5.11)
RDW: 14.3 % (ref 11.5–15.5)
WBC: 14.5 10*3/uL — AB (ref 4.0–10.5)

## 2017-09-17 LAB — MAGNESIUM: MAGNESIUM: 1.6 mg/dL — AB (ref 1.7–2.4)

## 2017-09-17 MED ORDER — SODIUM CHLORIDE 0.9 % IV SOLN: 250.0000 mL | INTRAVENOUS | Status: DC | PRN

## 2017-09-17 MED ORDER — ALBUTEROL SULFATE (2.5 MG/3ML) 0.083% IN NEBU
2.5000 mg | INHALATION_SOLUTION | Freq: Four times a day (QID) | RESPIRATORY_TRACT | Status: DC | PRN
  Administered 2017-09-17: 2.5 mg via RESPIRATORY_TRACT
  Filled 2017-09-17 (×3): qty 3

## 2017-09-17 MED ORDER — FUROSEMIDE 20 MG PO TABS
20.0000 mg | ORAL_TABLET | Freq: Every day | ORAL | Status: DC
  Administered 2017-09-17 – 2017-09-19 (×3): 20 mg via ORAL
  Filled 2017-09-17 (×3): qty 1

## 2017-09-17 MED ORDER — ZOLPIDEM TARTRATE 5 MG PO TABS: 5.0000 mg | ORAL_TABLET | Freq: Every evening | ORAL | Status: DC | PRN

## 2017-09-17 MED ORDER — CHOLECALCIFEROL 10 MCG (400 UNIT) PO TABS
400.0000 [IU] | ORAL_TABLET | Freq: Two times a day (BID) | ORAL | Status: DC
  Administered 2017-09-17 – 2017-09-19 (×4): 400 [IU] via ORAL
  Filled 2017-09-17 (×6): qty 1

## 2017-09-17 MED ORDER — CARVEDILOL 3.125 MG PO TABS
3.1250 mg | ORAL_TABLET | Freq: Two times a day (BID) | ORAL | Status: DC
  Filled 2017-09-17 (×2): qty 1

## 2017-09-17 MED ORDER — SODIUM CHLORIDE 0.9% FLUSH: 3.0000 mL | INTRAVENOUS | Status: DC | PRN

## 2017-09-17 MED ORDER — ATORVASTATIN CALCIUM 80 MG PO TABS
80.0000 mg | ORAL_TABLET | Freq: Every day | ORAL | Status: DC
  Administered 2017-09-18: 80 mg via ORAL
  Filled 2017-09-17: qty 1

## 2017-09-17 MED ORDER — SODIUM CHLORIDE 0.9% FLUSH
3.0000 mL | Freq: Two times a day (BID) | INTRAVENOUS | Status: DC
  Administered 2017-09-17 – 2017-09-19 (×5): 3 mL via INTRAVENOUS

## 2017-09-17 MED ORDER — ASPIRIN EC 81 MG PO TBEC
81.0000 mg | DELAYED_RELEASE_TABLET | Freq: Every day | ORAL | Status: DC
Start: 2017-09-17 — End: 2017-09-19
  Administered 2017-09-17 – 2017-09-19 (×3): 81 mg via ORAL
  Filled 2017-09-17 (×3): qty 1

## 2017-09-17 MED ORDER — ASPIRIN EC 81 MG PO TBEC: 81.0000 mg | DELAYED_RELEASE_TABLET | Freq: Every day | ORAL | Status: DC

## 2017-09-17 MED ORDER — SPIRONOLACTONE 25 MG PO TABS
25.0000 mg | ORAL_TABLET | Freq: Every day | ORAL | Status: DC
  Administered 2017-09-17 – 2017-09-19 (×3): 25 mg via ORAL
  Filled 2017-09-17 (×3): qty 1

## 2017-09-17 MED FILL — Heparin Sodium (Porcine) Inj 1000 Unit/ML: INTRAMUSCULAR | Qty: 30 | Status: AC

## 2017-09-17 MED FILL — Potassium Chloride Inj 2 mEq/ML: INTRAVENOUS | Qty: 40 | Status: AC

## 2017-09-17 MED FILL — Sodium Chloride IV Soln 0.9%: INTRAVENOUS | Qty: 250 | Status: AC

## 2017-09-17 MED FILL — Magnesium Sulfate Inj 50%: INTRAMUSCULAR | Qty: 10 | Status: AC

## 2017-09-17 MED FILL — Phenylephrine HCl Inj 10 MG/ML: INTRAMUSCULAR | Qty: 1 | Status: AC

## 2017-09-17 NOTE — Plan of Care (Signed)
Pt on NS KVO. Pt extubated, O2 weaned

## 2017-09-17 NOTE — Progress Notes (Signed)
Patient ID: [name], female   DOB: [birth year], 57 y.o.   MRN: [id number] TCTS Evening Rounds:  Hemodynamically stable in sinus rhythm. SBP 100-110 today. sats 97% Urine output good Ambulated today.

## 2017-09-17 NOTE — Progress Notes (Signed)
Pt just back from walking with RN. Walked on RA but felt SOB after walking so asked for O2 again. Discussed with pt and family walking at home, daily wts, low sodium, IS, and CRPII. Will send referral to [place]. Will f/u in am for ambulation. [place], ACSM 3:17 PM 09/17/2017

## 2017-09-17 NOTE — Progress Notes (Signed)
      [address]       [place] [address]             [phone]        CARDIOTHORACIC SURGERY PROGRESS NOTE   R1 Day Post-Op Procedure(s) (LRB): TRANSCATHETER MITRAL VALVE REPLACEMENT,TRANSAPICAL (N/A) TRANSESOPHAGEAL ECHOCARDIOGRAM (TEE) (N/A)  Subjective: Looks good.  Mild soreness in chest.  No SOB  Objective: Vital signs: BP Readings from Last 1 Encounters:  09/17/17 107/70   Pulse Readings from Last 1 Encounters:  09/17/17 79   Resp Readings from Last 1 Encounters:  09/17/17 15   Temp Readings from Last 1 Encounters:  09/17/17 98.5 F (36.9 C) (Oral)    Hemodynamics:    Physical Exam:  Rhythm:   sinus  Breath sounds: clear  Heart sounds:  RRR w/out murmur  Incisions:  Dressings dry, intact  Abdomen:  Soft, non-distended, non-tender  Extremities:  Warm, well-perfused  Chest tubes:  Very low volume thin serosanguinous output, no air leak    Intake/Output from previous day: 03/26 0701 - 03/27 0700 In: 2250 [I.V.:2000; IV Piggyback:250] Out: 772 [Urine:552; Blood:50; Chest Tube:170] Intake/Output this shift: No intake/output data recorded.  Lab Results:  CBC: Recent Labs    09/16/17 1712 09/17/17 0254  WBC  --  14.5*  HGB 12.9 11.4*  HCT 38.0 35.4*  PLT  --  110*    BMET:  Recent Labs    09/16/17 1613 09/16/17 1712 09/17/17 0254  NA 141 141 135  K 3.8 4.3 4.7  CL 104  --  104  CO2  --   --  21*  GLUCOSE 115* 117* 130*  BUN 9  --  13  CREATININE 1.00  --  1.14*  CALCIUM  --   --  8.5*     PT/INR:  No results for input(s): LABPROT, INR in the last 72 hours.  CBG (last 3)  No results for input(s): GLUCAP in the last 72 hours.  ABG    Component Value Date/Time   PHART 7.293 (L) 09/16/2017 1730   PCO2ART 48.9 (H) 09/16/2017 1730   PO2ART 81.0 (L) 09/16/2017 1730   HCO3 23.7 09/16/2017 1730   TCO2 25 09/16/2017 1730   ACIDBASEDEF 3.0 (H) 09/16/2017 1730   O2SAT 95.0 09/16/2017 1730     CXR: Clear  EKG: NSR w/out acute ischemic changes    Assessment/Plan: S/P Procedure(s) (LRB): TRANSCATHETER MITRAL VALVE REPLACEMENT,TRANSAPICAL (N/A) TRANSESOPHAGEAL ECHOCARDIOGRAM (TEE) (N/A)  Doing well POD1 Maintaining NSR w/ stable BP Breathing comfortably w/ O2 sats 97-99% on 2 L/min Expected post op acute blood loss anemia, very mild Chronic combined systolic and diastolic CHF  CAD s/p acute MI 2011   Mobilize  D/C chest tube  DAPT  Resume preop meds including low dose beta blocker  Start statin - patient not on statin preop for unclear reasons  Routine ECHO  Transfer 4E  Possible d/c home 1-2 days   Rexene Alberts, MD 09/17/2017 8:54 AM

## 2017-09-17 NOTE — Progress Notes (Signed)
  Echocardiogram 2D Echocardiogram has been performed.  [name] 09/17/2017, 4:23 PM

## 2017-09-17 NOTE — Discharge Summary (Signed)
Physician Discharge Summary  Patient ID: [name] MRN: [id number] DOB/AGE: 58/17/1962 58 y.o.  Admit date: 09/16/2017 Discharge date: 09/19/2017  Admission Diagnoses severe bioprosthetic mitral valve stenosis  Discharge Diagnoses:  Principal Problem:   S/P valve-in-valve transcatheter mitral valve replacement Active Problems:   Chronic combined systolic (congestive) and diastolic (congestive) heart failure (HCC)   S/P mitral valve replacement with bioprosthetic valve   CKD (chronic kidney disease) stage 3, GFR 30-59 ml/min (HCC)   COPD (chronic obstructive pulmonary disease) (HCC)   Stenosis of prosthetic mitral valve   Prosthetic valve dysfunction  Patient Active Problem List   Diagnosis Date Noted  . S/P valve-in-valve transcatheter mitral valve replacement 09/16/2017  . Prosthetic valve dysfunction   . Stenosis of prosthetic mitral valve   . Dyspnea 06/04/2017  . Abdominal pain   . Pancolitis ([place]) 06/03/2017  . Paroxysmal atrial fibrillation ([place]) 09/02/2016  . COPD (chronic obstructive pulmonary disease) ([place]) 09/02/2016  . Hypoxia 05/28/2015  . Acute bronchitis 05/28/2015  . CKD (chronic kidney disease) stage 3, GFR 30-59 ml/min (HCC) 05/28/2015  . Chronic anemia 05/28/2015  . Anxiety 03/08/2014  . Granulosa cell tumor of ovary 01/20/2013  . OA (osteoarthritis) of hip 05/06/2012  . Automatic implantable cardioverter-defibrillator in situ 11/08/2009  . 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 VENTRICULAR TACHYCARDIA 11/07/2009  . VENTRICULAR FIBRILLATION 11/07/2009  . Chronic combined systolic (congestive) and diastolic (congestive) heart failure ([place]) 11/07/2009  . S/P mitral valve replacement with bioprosthetic valve 10/21/2009    HPI:  The patient is a 57 year old woman with a history of hypercholesterolemia, hypothyroidism, stage III chronic kidney disease, ongoing tobacco abuse, recurrent ovarian cancer with multiple  intra-abdominal metastases treated with prior surgical resection in 2014 and 2015 with further slow progression, and coronary artery disease status post inferior MI in 8546 complicated by postinfarction papillary muscle rupture and left ventricular aneurysm who underwent urgent repair with mitral valve replacement using a bioprosthetic tissue valve and repair of the left ventricular aneurysm.  She has had ischemic cardiomyopathy, ventricular tachycardia status post ICD implantation, and chronic systolic congestive heart failure.  She said that she was on vacation at [facility] in April 2011 when she developed acute congestive heart failure and was diagnosed with severe acute mitral regurgitation secondary to postinfarction papillary muscle rupture.  She said that she had felt poorly 1-2 weeks prior to that but did not know that she had a myocardial infarction.  She was short of breath when she went into the hospital and surgery was planned for the following day but she became acutely short of breath overnight and had to have emergent repair using a 29 mm Edwards paramount bovine stented pericardial valve and repair of the aneurysm of the inferior posterior left ventricular wall.  She had some postoperative recurrent ventricular tachycardia and underwent implantation of an ICD but had a relatively smooth postop course considering and was home in about 7 days.  She is here by herself today.  She said that she is single but lives with her ex-husband and Lowry Ram and works as an Glass blower/designer for [employer].  She reports long-standing symptoms of mild exertional shortness of breath and has not been very active.  In December 2018 she was hospitalized with pancolitis and had an acute exacerbation of shortness of breath.  A follow-up echocardiogram showed severe mitral stenosis with a mean transvalvular gradient of 21 mmHg across the bioprosthetic valve.  Left ventricular ejection  fraction  decreased to 35-40% with global hypokinesis and inferior wall akinesis.  She improved with medical therapy but is continued to have exertional shortness of breath with any significant activity.  She underwent cardiac catheterization on 07/02/2017 which showed severe prosthetic mitral stenosis with a mean transvalvular gradient estimated at 18.8 mmHg.  The mitral valve area was estimated at 1.02 cm.  There is mild pulmonary hypertension.  Ejection fraction was estimated at 30-35%.  There is an 80% stenosis of the mid right coronary artery with nonobstructive disease in the left circumflex and LAD.  The patient was admitted for valve in valve mitral replacement.   Discharged Condition: good  Hospital Course: The patient was admitted electively and taken to the OR on 09/16/2017 at which time she underwent the below described procedure.  She tolerated it well and was taken to the surgical intensive care unit in stable condition.  Postoperative echocardiograms included trivial paravalvular leak and unchanged left ventricular systolic function.  Postoperative hospital course:  Patient is done quite well.  She has maintained sinus rhythm with no bradycardia or bundle block.BP does run low and beta blocker is currently on hold.  She has a very mild expected acute blood loss anemia.  She has a postoperative leukocytosis with white blood cell count of 14.5.  Renal function is normal with BUN 13 and creatinine 1.14.  Her chest tube was discontinued on postop day #1.  She has been started on a statin.  Routine post operative echocardiogram has been reviewed and it is felt she should be placed on coumadin in addition to Baby ASA .  She is tolerating gradually increasing activities using standard protocols.  At time of discharge the patient is felt to be quite stable.   Consults: cardiology  Significant Diagnostic Studies: Routine postoperative TEE and next day TTE  Treatments: surgery:  CARDIOTHORACIC SURGERY  OPERATIVE NOTE  Date of Procedure:                09/16/2017  Preoperative Diagnosis:        Prosthetic valve dysfunction s/p mitral valve replacement using a bioprosthetic tissue valve  Severe Mitral Stenosis  Mild Mitral Regurgitation  Postoperative Diagnosis:    Same   Procedure:        Valve-in-Valve Transcatheter Mitral Valve Replacement - Transapical Approach             Edwards Sapien 3 THV (size 29 mm, model # 9600TFX, serial # [id number])              Co-Surgeons:                        Valentina Gu. Roxy Manns, MD and [name], MD  Anesthesiologist:                  Arabella Merles, MD  Echocardiographer:              Ena Dawley, MD  Pre-operative Echo Findings: ? Prosthetic valve dysfunction of bioprosthetic mitral valve  ? Severe mitral stenosis ? Mild mitral regurgitation ? Moderate left ventricular systolic dysfunction  Post-operative Echo Findings: ? Trivial paravalvular leak ? Unchanged left ventricular systolic function    Discharge Exam: Blood pressure 110/68, pulse 93, temperature 97.9 F (36.6 C), temperature source Oral, resp. rate 17, height 5\' 6"  (1.676 m), weight 195 lb 3.2 oz (88.5 kg), SpO2 (!) 89 %.  General appearance: alert, cooperative and no distress Heart: regular rate and rhythm  and no murmur Lungs: clear to auscultation bilaterally Abdomen: benign Extremities: no edema Wound: incis healing well    Disposition: Discharge disposition: 01-Home or Self Care     discharged home  Discharge Instructions    Amb Referral to Cardiac Rehabilitation   Complete by:  As directed    Diagnosis:  Valve Replacement   Valve:  Mitral Comment - TMVR   Discharge patient   Complete by:  As directed    Discharge disposition:  01-Home or Self Care   Discharge patient date:  09/19/2017     Allergies as of 09/19/2017      Reactions   Prochlorperazine Edisylate Other (See Comments)   Nervous/ flutter/ shakes--compazine    Adhesive [tape] Other (See Comments)   Redness/skin peeling Redness/hives from adhesive tape( tolerates latex gloves); tolerates paper tape   Prochlorperazine Other (See Comments)   Jitters, hyper      Medication List    STOP taking these medications   carvedilol 3.125 MG tablet Commonly known as:  COREG   LORazepam 0.5 MG tablet Commonly known as:  ATIVAN     TAKE these medications   acetaminophen 500 MG tablet Commonly known as:  TYLENOL Take 2 tablets (1,000 mg total) by mouth every 6 (six) hours.   albuterol 108 (90 Base) MCG/ACT inhaler Commonly known as:  PROVENTIL HFA;VENTOLIN HFA Inhale 2 puffs into the lungs every 4 (four) hours as needed for wheezing or shortness of breath.   aspirin 81 MG EC tablet Take 1 tablet (81 mg total) by mouth daily. What changed:    medication strength  how much to take   atorvastatin 80 MG tablet Commonly known as:  LIPITOR Take 1 tablet (80 mg total) by mouth daily at 6 PM.   b complex vitamins tablet Take 1 tablet by mouth 2 (two) times daily.   diphenhydrAMINE 25 MG tablet Commonly known as:  BENADRYL Take 25 mg by mouth every 6 (six) hours as needed for itching or allergies.   furosemide 20 MG tablet Commonly known as:  LASIX Take 20 mg by mouth daily.   multivitamin with minerals Tabs tablet Take 1 tablet by mouth daily.   oxyCODONE 5 MG immediate release tablet Commonly known as:  Oxy IR/ROXICODONE Take 1-2 tablets (5-10 mg total) by mouth every 6 (six) hours as needed for severe pain.   PERCOGESIC EXTRA STRENGTH 12.5-500 MG Tabs Generic drug:  Diphenhydramine-APAP Take 2 tablets by mouth 2 (two) times daily as needed (for pain.). PERCOGESIC   spironolactone 25 MG tablet Commonly known as:  ALDACTONE Take 1 tablet (25 mg total) by mouth daily.   Vitamin D (Ergocalciferol) 50000 units Caps capsule Commonly known as:  DRISDOL TAKE 1 CAPSULE (50,000 UNITS TOTAL) BY MOUTH EVERY 7 (SEVEN) DAYS. What changed:     when to take this  additional instructions   VITAMIN D-400 400 units Tabs tablet Generic drug:  cholecalciferol Take 400 Units by mouth 2 (two) times daily.   warfarin 5 MG tablet Commonly known as:  COUMADIN Take 1 tablet (5 mg total) by mouth daily at 6 PM. As directed by the coumadin clinic   zolpidem 5 MG tablet Commonly known as:  AMBIEN TAKE 1 TABLET BY MOUTH AT BEDTIME      Follow-up Information    Rexene Alberts, MD Follow up.   Specialty:  Cardiothoracic Surgery Why:  Please see discharge paperwork for instructions on follow-up including repeat echocardiogram. Contact information: [address]  [place] [address] [phone]          Appointments arranged and in AVS  Signed: John Giovanni 09/19/2017, 8:04 AM

## 2017-09-17 NOTE — Progress Notes (Signed)
Late Entry:  Ambulated x's 2 today without oxygen. 20 min after returning from walk pt c/o SOB. Exp wheezes bilaterally. Pt placed back on 02 at 2L. Paged Dr Roxy Manns and order given to administer albuterol PRN. Of note pt was also restarted on home lasix. After albuterol and lasix administered, pt reported improvement in SOB. No further wheezing noted on exam.  Total distance 343ft for today. Tolerated sitting in chair at bedside for 4 hours.

## 2017-09-18 ENCOUNTER — Inpatient Hospital Stay (HOSPITAL_COMMUNITY): Payer: BLUE CROSS/BLUE SHIELD

## 2017-09-18 DIAGNOSIS — I340 Nonrheumatic mitral (valve) insufficiency: Secondary | ICD-10-CM

## 2017-09-18 DIAGNOSIS — Z954 Presence of other heart-valve replacement: Secondary | ICD-10-CM

## 2017-09-18 LAB — LIPID PANEL
CHOL/HDL RATIO: 6.7 ratio
Cholesterol: 247 mg/dL — ABNORMAL HIGH (ref 0–200)
HDL: 37 mg/dL — ABNORMAL LOW (ref >40)
LDL Cholesterol: 181 mg/dL — ABNORMAL HIGH (ref 0–99)
Triglycerides: 144 mg/dL (ref <150)
VLDL: 29 mg/dL (ref 0–40)

## 2017-09-18 LAB — BASIC METABOLIC PANEL
ANION GAP: 10 (ref 5–15)
BUN: 13 mg/dL (ref 6–20)
CHLORIDE: 103 mmol/L (ref 101–111)
CO2: 24 mmol/L (ref 22–32)
Calcium: 8.9 mg/dL (ref 8.9–10.3)
Creatinine, Ser: 1.21 mg/dL — ABNORMAL HIGH (ref 0.44–1.00)
GFR calc Af Amer: 56 mL/min — ABNORMAL LOW (ref >60)
GFR, EST NON AFRICAN AMERICAN: 48 mL/min — AB (ref >60)
GLUCOSE: 99 mg/dL (ref 65–99)
POTASSIUM: 3.7 mmol/L (ref 3.5–5.1)
Sodium: 137 mmol/L (ref 135–145)

## 2017-09-18 LAB — CBC
HEMATOCRIT: 33.9 % — AB (ref 36.0–46.0)
HEMOGLOBIN: 10.9 g/dL — AB (ref 12.0–15.0)
MCH: 28.9 pg (ref 26.0–34.0)
MCHC: 32.2 g/dL (ref 30.0–36.0)
MCV: 89.9 fL (ref 78.0–100.0)
PLATELETS: 97 10*3/uL — AB (ref 150–400)
RBC: 3.77 MIL/uL — AB (ref 3.87–5.11)
RDW: 14.6 % (ref 11.5–15.5)
WBC: 13.9 10*3/uL — AB (ref 4.0–10.5)

## 2017-09-18 MED ORDER — PATIENT'S GUIDE TO USING COUMADIN BOOK
Freq: Once | Status: AC
  Administered 2017-09-18: 11:00:00
  Filled 2017-09-18: qty 1

## 2017-09-18 MED ORDER — WARFARIN SODIUM 5 MG PO TABS
5.0000 mg | ORAL_TABLET | Freq: Every day | ORAL | Status: DC
  Administered 2017-09-18: 5 mg via ORAL
  Filled 2017-09-18: qty 1

## 2017-09-18 MED ORDER — WARFARIN VIDEO
Freq: Once | Status: AC
  Administered 2017-09-18: 11:00:00

## 2017-09-18 MED ORDER — WARFARIN - PHYSICIAN DOSING INPATIENT: Freq: Every day | Status: DC

## 2017-09-18 NOTE — Progress Notes (Signed)
CARDIAC REHAB PHASE I   PRE:  Rate/Rhythm: 94 SR    BP: sitting 109/65    SaO2: 97 RA  MODE:  Ambulation: 370 ft   POST:  Rate/Rhythm: 106 ST    BP: sitting 119/63     SaO2: 96 RA  Pt ambulated with slow, steady walk. Feels tired today, ready to stop after 370 ft. VSS. Discussed smoking cessation. She is wanting to quit but sts she knows from experience that cold [place] will not work for her. She plans to try Chantix again. Her ex-husband smokes and this has been difficult in the past but he is making plans to quit as well. Gave pt fake cigarette, which she was very excited about. Also gave resources. Encouraged IS and more ambulation today. [phone]   [place], ACSM 09/18/2017 11:11 AM

## 2017-09-18 NOTE — Progress Notes (Signed)
Patient ID: [name], female   DOB: 05-26-1961, 57 y.o.   MRN: [id number] EVENING ROUNDS NOTE :     [address]       [place] [address]             [phone]                 2 Days Post-Op Procedure(s) (LRB): TRANSCATHETER MITRAL VALVE REPLACEMENT,TRANSAPICAL (N/A) TRANSESOPHAGEAL ECHOCARDIOGRAM (TEE) (N/A)  Total Length of Stay:  LOS: 2 days  BP 95/61   Pulse 91   Temp 98.4 F (36.9 C) (Oral)   Resp 16   Ht 5\' 6"  (1.676 m)   Wt 199 lb 4.7 oz (90.4 kg)   SpO2 93%   BMI 32.17 kg/m   .Intake/Output      03/27 0701 - 03/28 0700 03/28 0701 - 03/29 0700   P.O. 840 340   I.V. (mL/kg) 10 (0.1) 10 (0.1)   IV Piggyback 350 100   Total Intake(mL/kg) 1200 (13.3) 450 (5)   Urine (mL/kg/hr) 2375 (1.1)    Blood     Chest Tube     Total Output 2375    Net -1175 +450        Urine Occurrence 5 x 2 x     . sodium chloride       Lab Results  Component Value Date   WBC 13.9 (H) 09/18/2017   HGB 10.9 (L) 09/18/2017   HCT 33.9 (L) 09/18/2017   PLT 97 (L) 09/18/2017   GLUCOSE 99 09/18/2017   CHOL 247 (H) 09/18/2017   TRIG 144 09/18/2017   HDL 37 (L) 09/18/2017   LDLCALC 181 (H) 09/18/2017   ALT 13 (L) 09/12/2017   AST 20 09/12/2017   NA 137 09/18/2017   K 3.7 09/18/2017   CL 103 09/18/2017   CREATININE 1.21 (H) 09/18/2017   BUN 13 09/18/2017   CO2 24 09/18/2017   TSH 1.096 05/29/2015   INR 1.02 09/12/2017   HGBA1C 5.0 09/12/2017   Stable day today Started on Coumadin tonight Possible discharge home tomorrow   Grace Isaac MD  Beeper [phone] Office [phone] 09/18/2017 6:24 PM

## 2017-09-18 NOTE — Discharge Instructions (Signed)
ACTIVITY AND EXERCISE °• Daily activity and exercise are an important part °of your recovery. People recover at different rates °depending on their general health and type of °valve procedure. °• Most people require six to 10 weeks to feel °recovered. °• No lifting, pushing, pulling more than 10 pounds °(examples to avoid: groceries, vacuuming, °gardening, golfing): °- For one week with a procedure through the groin. °- For six weeks for procedures through the chest °wall. °- For three months for procedures through the °breast-bone. °• After the initial healing process of the access site, °we recommend cardiac rehabilitation for all TAVR °patients. Cardiac rehabilitation will help you: °- Rebuild stamina, strength and balance. °- Learn how to participate in activities safely, as well °as help you regain confidence to do so. °- Return to activities of daily living and leisure. °• Discuss attending cardiac rehabilitation at your °follow-up appointment  ° °DRIVING °• Do not drive for four weeks after the date of your °procedure. °• If you have been told by your doctor in the past °that you may not drive, you must talk with him/her °before you begin driving again. °• When you resume driving, you must have someone °with you. ° °HYGIENE °If you had a femoral (leg) procedure, you may take a shower when you return home. After the shower, pat the °site dry. Do NOT use powder, oils or lotions in your groin area until the site has completely healed. °• If you had a chest procedure, you may shower when you return home unless specifically instructed not to by °your discharging practitioner. °- DO NOT scrub incision; pat dry with a towel °- DO NOT apply any lotions, oils, powders to the incision °- No tub baths / swimming for at least six weeks. ° °SITE CARE °• You likely will have small openings in both groins °from catheters used during the procedure. If you °had a transfemoral procedure, one groin will have a °larger opening  and may be bruised or tender. °• If you had a chest procedure, you will have either a °small incision in your upper sternum (breast-bone) °or between your ribs on your left side. °- Chest wall site: The surgical incision should be °kept dry (no lotions / oils / powders) and open °to air. If you experience irritation from clothing °rubbing on the incision, a light gauze dressing °may be applied. °- Inspect your incision daily; notify your °physician if there is increased redness, swelling °or drainage from the incision. °- If the incision is located on your breast-bone °you must avoid lifting objects heavier than a °gallon of milk (eight pounds) and stretching / °twisting / pulling with your arms for at least °three months to ensure strong bone healing. ° ° °CONTACT [phone] °• Check your sites daily. Contact our office if you have °any of the following problems: °- Redness and warmth that does not go away °- Yellow or green drainage from the wound °- Fever and chills °- Increasing numbness in your legs °- Worsening pain at the site °• If you had a leg/groin procedure, it is normal to °have bruising or a soft lump at the site. It is not °normal if the lump suddenly becomes larger or °more firm. This may mean you are bleeding. If this °happens: °- Lie down °- Have someone press down hard, just above °the hole in your skin where the procedure was °performed for 15 minutes. If after holding on the °site, the lump does not become larger or harder, °they   are performing this correctly. - If the bleeding has stopped after 15 minutes, rest and stay laying down for at least two hours. - If the bleeding continues, call 911 for an ambulance. Do NOT drive yourself or have someone else drive you.  Information on my medicine - Coumadin   (Warfarin)  This medication education was reviewed with me or my healthcare representative as part of my discharge preparation.  The pharmacist that spoke with me during my hospital  stay was:  Carnella Gu[place], [facility]  Why was Coumadin prescribed for you? Coumadin was prescribed for you because you have a blood clot or a medical condition that can cause an increased risk of forming blood clots. Blood clots can cause serious health problems by blocking the flow of blood to the heart, lung, or brain. Coumadin can prevent harmful blood clots from forming. As a reminder your indication for Coumadin is: Mitral valve replacement  What test will check on my response to Coumadin? While on Coumadin (warfarin) you will need to have an INR test regularly to ensure that your dose is keeping you in the desired range. The INR (international normalized ratio) number is calculated from the result of the laboratory test called prothrombin time (PT).  If an INR APPOINTMENT HAS NOT ALREADY BEEN MADE FOR YOU please schedule an appointment to have this lab work done by your health care provider within 7 days. Your INR goal is usually a number between:  2 to 3 or your provider may give you a more narrow range like 2-2.5.  Ask your health care provider during an office visit what your goal INR is.  What  do you need to  know  About  COUMADIN? Take Coumadin (warfarin) exactly as prescribed by your healthcare provider about the same time each day.  DO NOT stop taking without talking to the doctor who prescribed the medication.  Stopping without other blood clot prevention medication to take the place of Coumadin may increase your risk of developing a new clot or stroke.  Get refills before you run out.  What do you do if you miss a dose? If you miss a dose, take it as soon as you remember on the same day then continue your regularly scheduled regimen the next day.  Do not take two doses of Coumadin at the same time.  Important Safety Information A possible side effect of Coumadin (Warfarin) is an increased risk of bleeding. You should call your healthcare provider right away if you experience any of  the following: ? Bleeding from an injury or your nose that does not stop. ? Unusual colored urine (red or dark brown) or unusual colored stools (red or black). ? Unusual bruising for unknown reasons. ? A serious fall or if you hit your head (even if there is no bleeding).  Some foods or medicines interact with Coumadin (warfarin) and might alter your response to warfarin. To help avoid this: ? Eat a balanced diet, maintaining a consistent amount of Vitamin K. ? Notify your provider about major diet changes you plan to make. ? Avoid alcohol or limit your intake to 1 drink for women and 2 drinks for men per day. (1 drink is 5 oz. wine, 12 oz. beer, or 1.5 oz. liquor.)  Make sure that ANY health care provider who prescribes medication for you knows that you are taking Coumadin (warfarin).  Also make sure the healthcare provider who is monitoring your Coumadin knows when you have started a  new medication including herbals and non-prescription products.  Coumadin (Warfarin)  Major Drug Interactions  Increased Warfarin Effect Decreased Warfarin Effect  Alcohol (large quantities) Antibiotics (esp. Septra/Bactrim, Flagyl, Cipro) Amiodarone (Cordarone) Aspirin (ASA) Cimetidine (Tagamet) Megestrol (Megace) NSAIDs (ibuprofen, naproxen, etc.) Piroxicam (Feldene) Propafenone (Rythmol SR) Propranolol (Inderal) Isoniazid (INH) Posaconazole (Noxafil) Barbiturates (Phenobarbital) Carbamazepine (Tegretol) Chlordiazepoxide (Librium) Cholestyramine (Questran) Griseofulvin Oral Contraceptives Rifampin Sucralfate (Carafate) Vitamin K   Coumadin (Warfarin) Major Herbal Interactions  Increased Warfarin Effect Decreased Warfarin Effect  Garlic Ginseng Ginkgo biloba Coenzyme Q10 Green tea St. Johns wort    Coumadin (Warfarin) FOOD Interactions  Eat a consistent number of servings per week of foods HIGH in Vitamin K (1 serving =  cup)  Collards (cooked, or boiled & drained) Kale (cooked,  or boiled & drained) Mustard greens (cooked, or boiled & drained) Parsley *serving size only =  cup Spinach (cooked, or boiled & drained) Swiss chard (cooked, or boiled & drained) Turnip greens (cooked, or boiled & drained)  Eat a consistent number of servings per week of foods MEDIUM-HIGH in Vitamin K (1 serving = 1 cup)  Asparagus (cooked, or boiled & drained) Broccoli (cooked, boiled & drained, or raw & chopped) Brussel sprouts (cooked, or boiled & drained) *serving size only =  cup Lettuce, raw (green leaf, endive, romaine) Spinach, raw Turnip greens, raw & chopped   These websites have more information on Coumadin (warfarin):  FailFactory.se; VeganReport.com.au;

## 2017-09-18 NOTE — Progress Notes (Addendum)
[address]       [place] [address]             [phone]      2 Days Post-Op Procedure(s) (LRB): TRANSCATHETER MITRAL VALVE REPLACEMENT,TRANSAPICAL (N/A) TRANSESOPHAGEAL ECHOCARDIOGRAM (TEE) (N/A) Subjective: Feels weaker and a little more SOB today  Objective: Vital signs in last 24 hours: Temp:  [97.8 F (36.6 C)-98.8 F (37.1 C)] 98.5 F (36.9 C) (03/28 0428) Pulse Rate:  [76-88] 82 (03/28 0700) Cardiac Rhythm: Normal sinus rhythm (03/28 0400) Resp:  [13-31] 18 (03/28 0700) BP: (80-107)/(48-70) 80/51 (03/28 0700) SpO2:  [93 %-99 %] 93 % (03/28 0700) Arterial Line BP: (107)/(57) 107/57 (03/27 0800) Weight:  [199 lb 4.7 oz (90.4 kg)] 199 lb 4.7 oz (90.4 kg) (03/28 0600)  Hemodynamic parameters for last 24 hours:    Intake/Output from previous day: 03/27 0701 - 03/28 0700 In: 1100 [P.O.:840; I.V.:10; IV Piggyback:250] Out: 2375 [Urine:2375] Intake/Output this shift: No intake/output data recorded.  General appearance: alert, cooperative and no distress Heart: regular rate and rhythm and soft systolic murmur Lungs: dim in lower fields Abdomen: benign Extremities: no edema Wound: dressing CDI  Lab Results: Recent Labs    09/17/17 0254 09/18/17 0240  WBC 14.5* 13.9*  HGB 11.4* 10.9*  HCT 35.4* 33.9*  PLT 110* 97*   BMET:  Recent Labs    09/17/17 0254 09/18/17 0240  NA 135 137  K 4.7 3.7  CL 104 103  CO2 21* 24  GLUCOSE 130* 99  BUN 13 13  CREATININE 1.14* 1.21*  CALCIUM 8.5* 8.9    PT/INR: No results for input(s): LABPROT, INR in the last 72 hours. ABG    Component Value Date/Time   PHART 7.293 (L) 09/16/2017 1730   HCO3 23.7 09/16/2017 1730   TCO2 25 09/16/2017 1730   ACIDBASEDEF 3.0 (H) 09/16/2017 1730   O2SAT 95.0 09/16/2017 1730   CBG (last 3)  No results for input(s): GLUCAP in the last 72 hours.  Meds Scheduled Meds: . acetaminophen  1,000 mg Oral Q6H  . aspirin  81 mg Oral Daily  . atorvastatin  80  mg Oral q1800  . carvedilol  3.125 mg Oral BID WC  . cholecalciferol  400 Units Oral BID  . clopidogrel  75 mg Oral Q breakfast  . furosemide  20 mg Oral Daily  . sodium chloride flush  3 mL Intravenous Q12H  . spironolactone  25 mg Oral Daily   Continuous Infusions: . sodium chloride    .  ceFAZolin (ANCEF) IV Stopped (09/18/17 7001)   PRN Meds:.sodium chloride, albuterol, metoprolol tartrate, morphine injection, ondansetron (ZOFRAN) IV, oxyCODONE, sodium chloride flush, traMADol, zolpidem  Xrays Dg Chest 2 View  Result Date: 09/18/2017 CLINICAL DATA:  Atelectasis. EXAM: CHEST - 2 VIEW COMPARISON:  09/17/2017 FINDINGS: Mild bibasilar atelectasis and small effusions.  Negative for edema. AICD unchanged.  TAVR unchanged IMPRESSION: Mild bibasilar atelectasis and small effusions.  Negative for edema Electronically Signed   By: Franchot Gallo M.D.   On: 09/18/2017 07:51   Dg Chest Port 1 View  Result Date: 09/17/2017 CLINICAL DATA:  Post transcatheter MVR, history coronary disease post MI, bladder cancer, asthma, CHF, stage III chronic kidney disease, ischemic cardiomyopathy, smoker, proximal atrial fibrillation EXAM: PORTABLE CHEST 1 VIEW COMPARISON:  Portable exam 0615 hours compared to 09/16/2017 FINDINGS: LEFT jugular central venous catheter with tip projecting over SVC. LEFT subclavian AICD leads project for over RIGHT atrium and RIGHT ventricle.  Post median sternotomy and MVR. Mediastinal drain present. Stable heart size and pulmonary vascularity. Minimal bibasilar atelectasis. No infiltrate, pleural effusion or pneumothorax. Extensive calcific debris/loose bodies associated with the LEFT glenohumeral joint. IMPRESSION: Postsurgical changes as above. Minimal bibasilar atelectasis. Electronically Signed   By: Lavonia Dana M.D.   On: 09/17/2017 09:44   Dg Chest Port 1 View  Result Date: 09/16/2017 CLINICAL DATA:  Postop EXAM: PORTABLE CHEST 1 VIEW COMPARISON:  09/12/2017 FINDINGS: Post  sternotomy changes and and valve replacement. Left-sided pacing device similar compared to prior. Left IJ central venous catheter tip obscured by overlying pacer wires, catheter tubing visualized to the brachiocephalic confluence. Left lower chest tube with possible kink in the tube laterally. Tiny left pleural effusion. Atelectasis at the left base. Probable tiny left apical pneumothorax. Mild cardiomegaly. IMPRESSION: 1. Left-sided central venous catheter tip partially obscured by overlying pacer leads. Tubing visualized to the brachiocephalic confluence. 2. Left basilar chest tube with possible kink at the lateral aspect of the chest tube. Probable tiny left apical pneumothorax. 3. Small amount of atelectasis and pleural fluid at the left base. 4. Borderline to mild cardiomegaly. Electronically Signed   By: Donavan Foil M.D.   On: 09/16/2017 17:49    Assessment/Plan: S/P Procedure(s) (LRB): TRANSCATHETER MITRAL VALVE REPLACEMENT,TRANSAPICAL (N/A) TRANSESOPHAGEAL ECHOCARDIOGRAM (TEE) (N/A)  1 overall doing well 2 subjectively feels a little worse today but may have been a little too active yesterday and now more fatigued 3 leukocytosis improving, H/H down a little, platelet count down a little- can cont to monitor clinically 4 pretty stable hemodynamically with sinus rhythm and some PVC's. SBP runs relatively low but has gotten as low as 80- cont to monitor clinically, no change in beta blocker dose 5 good UO on low dose lasix and spironolactone- Creat up slightly. These are home doses- cont for now 6 cont rehab/pulm toilet 7 poss home later today vs tomorrow if all agree 8 discussed with Dr Cyndia Bent, will d/c coreg for now with low relative BP  LOS: 2 days    [name] 09/18/2017    I have seen and examined the patient and agree with the assessment and plan as outlined.  F/U echo done yesterday reported to have elevated transmitral gradient (15 mmHg).  Clinically patient looks fine.  Will  anticoagulate w/ low dose ASA and warfarin but okay to let INR drift up after hospital discharge w/out bridging.  Check repeat echo in 1 month. Possible d/c home tomorrow.   Rexene Alberts, MD 09/18/2017 9:38 AM

## 2017-09-19 ENCOUNTER — Encounter: Payer: BLUE CROSS/BLUE SHIELD | Admitting: Internal Medicine

## 2017-09-19 DIAGNOSIS — Z954 Presence of other heart-valve replacement: Secondary | ICD-10-CM

## 2017-09-19 DIAGNOSIS — I340 Nonrheumatic mitral (valve) insufficiency: Secondary | ICD-10-CM

## 2017-09-19 LAB — TYPE AND SCREEN
ABO/RH(D): AB POS
ANTIBODY SCREEN: NEGATIVE
UNIT DIVISION: 0
UNIT DIVISION: 0
Unit division: 0
Unit division: 0

## 2017-09-19 LAB — PROTIME-INR
INR: 1.15
Prothrombin Time: 14.6 seconds (ref 11.4–15.2)

## 2017-09-19 LAB — BPAM RBC
BLOOD PRODUCT EXPIRATION DATE: 201904042359
BLOOD PRODUCT EXPIRATION DATE: 201904052359
Blood Product Expiration Date: 201904202359
Blood Product Expiration Date: 201904222359
ISSUE DATE / TIME: 201903252154
ISSUE DATE / TIME: 201903261506
ISSUE DATE / TIME: 201903261506
ISSUE DATE / TIME: 201903291132
UNIT TYPE AND RH: 6200
UNIT TYPE AND RH: 6200
UNIT TYPE AND RH: 8400
Unit Type and Rh: 8400

## 2017-09-19 MED ORDER — ATORVASTATIN CALCIUM 80 MG PO TABS: 80.0000 mg | ORAL_TABLET | Freq: Every day | ORAL | 1 refills | Status: DC

## 2017-09-19 MED ORDER — ACETAMINOPHEN 500 MG PO TABS: 1000.0000 mg | ORAL_TABLET | Freq: Four times a day (QID) | ORAL | 0 refills | Status: DC

## 2017-09-19 MED ORDER — WARFARIN SODIUM 5 MG PO TABS: 5.0000 mg | ORAL_TABLET | Freq: Every day | ORAL | 1 refills | Status: DC

## 2017-09-19 MED ORDER — ASPIRIN 81 MG PO TBEC: 81.0000 mg | DELAYED_RELEASE_TABLET | Freq: Every day | ORAL | Status: DC

## 2017-09-19 MED ORDER — OXYCODONE HCL 5 MG PO TABS: 5.0000 mg | ORAL_TABLET | Freq: Four times a day (QID) | ORAL | 0 refills | Status: DC | PRN

## 2017-09-19 NOTE — Progress Notes (Addendum)
      [address]       [place] [address]             [phone]      3 Days Post-Op Procedure(s) (LRB): TRANSCATHETER MITRAL VALVE REPLACEMENT,TRANSAPICAL (N/A) TRANSESOPHAGEAL ECHOCARDIOGRAM (TEE) (N/A) Subjective: Feels well, better than yesterday  Objective: Vital signs in last 24 hours: Temp:  [97.9 F (36.6 C)-98.9 F (37.2 C)] 97.9 F (36.6 C) (03/29 0347) Pulse Rate:  [78-108] 93 (03/29 0500) Cardiac Rhythm: Normal sinus rhythm (03/29 0000) Resp:  [12-23] 17 (03/29 0500) BP: (90-122)/(50-73) 110/68 (03/29 0400) SpO2:  [89 %-98 %] 89 % (03/29 0500) Weight:  [195 lb 3.2 oz (88.5 kg)] 195 lb 3.2 oz (88.5 kg) (03/29 0400)  Hemodynamic parameters for last 24 hours:    Intake/Output from previous day: 03/28 0701 - 03/29 0700 In: 460 [P.O.:340; I.V.:20; IV Piggyback:100] Out: -  Intake/Output this shift: No intake/output data recorded.  General appearance: alert, cooperative and no distress Heart: regular rate and rhythm and no murmur Lungs: clear to auscultation bilaterally Abdomen: benign Extremities: no edema Wound: incis healing well  Lab Results: Recent Labs    09/17/17 0254 09/18/17 0240  WBC 14.5* 13.9*  HGB 11.4* 10.9*  HCT 35.4* 33.9*  PLT 110* 97*   BMET:  Recent Labs    09/17/17 0254 09/18/17 0240  NA 135 137  K 4.7 3.7  CL 104 103  CO2 21* 24  GLUCOSE 130* 99  BUN 13 13  CREATININE 1.14* 1.21*  CALCIUM 8.5* 8.9    PT/INR:  Recent Labs    09/19/17 0213  LABPROT 14.6  INR 1.15   ABG    Component Value Date/Time   PHART 7.293 (L) 09/16/2017 1730   HCO3 23.7 09/16/2017 1730   TCO2 25 09/16/2017 1730   ACIDBASEDEF 3.0 (H) 09/16/2017 1730   O2SAT 95.0 09/16/2017 1730   CBG (last 3)  No results for input(s): GLUCAP in the last 72 hours.  Meds Scheduled Meds: . aspirin  81 mg Oral Daily  . atorvastatin  80 mg Oral q1800  . cholecalciferol  400 Units Oral BID  . furosemide  20 mg Oral Daily  . sodium  chloride flush  3 mL Intravenous Q12H  . spironolactone  25 mg Oral Daily  . warfarin  5 mg Oral q1800  . Warfarin - Physician Dosing Inpatient   Does not apply q1800   Continuous Infusions: . sodium chloride     PRN Meds:.sodium chloride, albuterol, metoprolol tartrate, morphine injection, ondansetron (ZOFRAN) IV, oxyCODONE, sodium chloride flush, traMADol, zolpidem  Xrays Dg Chest 2 View  Result Date: 09/18/2017 CLINICAL DATA:  Atelectasis. EXAM: CHEST - 2 VIEW COMPARISON:  09/17/2017 FINDINGS: Mild bibasilar atelectasis and small effusions.  Negative for edema. AICD unchanged.  TAVR unchanged IMPRESSION: Mild bibasilar atelectasis and small effusions.  Negative for edema Electronically Signed   By: Franchot Gallo M.D.   On: 09/18/2017 07:51    Assessment/Plan: S/P Procedure(s) (LRB): TRANSCATHETER MITRAL VALVE REPLACEMENT,TRANSAPICAL (N/A) TRANSESOPHAGEAL ECHOCARDIOGRAM (TEE) (N/A) 1 doing well overall, hemodyn stable, BP 90's to 120's range 2 started on coumadin 3 No new labs except INR- 1.15 4 stable for discharge  LOS: 3 days    [name] 09/19/2017   Chart reviewed, patient examined, agree with above. [name] feels fine. Incision looks good. Instructed to keep gauze over incision to prevent maceration under breast.   Plan to send home today on Coumadin.

## 2017-09-19 NOTE — Care Management Note (Signed)
Case Management Note Marvetta Gibbons RN, BSN Unit 4E-Case Manager--2H coverage [phone]  Patient Details  Name: [name] MRN: [id number] Date of Birth: 1960/12/30  Subjective/Objective:   Pt admitted s/p TRANSCATHETER MITRAL VALVE REPLACEMENT,TRANSAPICAL                 Action/Plan: PTA pt lived at home, plan to return home- stable for d/c today- no CM needs noted for transition home  Expected Discharge Date:  09/19/17               Expected Discharge Plan:  Home/Self Care  In-House Referral:     Discharge planning Services  CM Consult  Post Acute Care Choice:  NA Choice offered to:  NA  DME Arranged:    DME Agency:     HH Arranged:    [place] Agency:     Status of Service:  Completed, signed off  If discussed at [place] of Stay Meetings, dates discussed:    Discharge Disposition: home/self care   Additional Comments:  Dawayne Patricia, RN 09/19/2017, 10:32 AM

## 2017-09-19 NOTE — Progress Notes (Signed)
Discharge education provided. Questions answered. Pt states no further questions. Discharge home with husband. Stable condition. Lindwood Coke, RN 09/19/2017 11:41

## 2017-09-22 ENCOUNTER — Telehealth (HOSPITAL_COMMUNITY): Payer: Self-pay

## 2017-09-22 ENCOUNTER — Ambulatory Visit (INDEPENDENT_AMBULATORY_CARE_PROVIDER_SITE_OTHER): Payer: BLUE CROSS/BLUE SHIELD | Admitting: *Deleted

## 2017-09-22 ENCOUNTER — Telehealth: Payer: Self-pay

## 2017-09-22 DIAGNOSIS — Z5181 Encounter for therapeutic drug level monitoring: Secondary | ICD-10-CM | POA: Diagnosis not present

## 2017-09-22 DIAGNOSIS — I480 Paroxysmal atrial fibrillation: Secondary | ICD-10-CM

## 2017-09-22 DIAGNOSIS — Z953 Presence of xenogenic heart valve: Secondary | ICD-10-CM | POA: Diagnosis not present

## 2017-09-22 DIAGNOSIS — Z952 Presence of prosthetic heart valve: Secondary | ICD-10-CM

## 2017-09-22 LAB — POCT INR: INR: 2.3

## 2017-09-22 NOTE — Patient Instructions (Addendum)
Description   Start taking 1 tablet daily except 1/2 tablet on Tuesday, Thursday, and Saturday. Recheck INR in 1 week. Start a normal dark green consistency.  [facility] [phone] Main [phone]     A full discussion of the nature of anticoagulants has been carried out.  A benefit risk analysis has been presented to the patient, so that they understand the justification for choosing anticoagulation at this time. The need for frequent and regular monitoring, precise dosage adjustment and compliance is stressed.  Side effects of potential bleeding are discussed.  The patient should avoid any OTC items containing aspirin or ibuprofen, and should avoid great swings in general diet.  Avoid alcohol consumption.  Call if any signs of abnormal bleeding. If you miss your dose at 6pm, you have 12 hours to take then dose until it must be counted as a missed dose. Must adhere to weekly appts.

## 2017-09-22 NOTE — Telephone Encounter (Signed)
Patients insurance is inactive through [employer]. Will contact patient in regards to insurance.

## 2017-09-22 NOTE — Telephone Encounter (Signed)
Attempted to call patient in regards to Insurance - lm on vm

## 2017-09-22 NOTE — Telephone Encounter (Signed)
Patient contacted regarding discharge from [facility] on 09/19/2017.  Patient understands to follow up with provider TCTS App on 10/06/2017 at 2:30 PM at [place] office. Patient understands discharge instructions? yes Patient understands medications and regiment? yes Patient understands to bring all medications to this visit? yes  The pt is doing well and just came home from an INR apt. The pt is aware of 09/25/17 suture removal apt and 10/31/17 Echo and OV with Dr Burt Knack.

## 2017-09-25 ENCOUNTER — Encounter (INDEPENDENT_AMBULATORY_CARE_PROVIDER_SITE_OTHER): Payer: Self-pay

## 2017-09-25 DIAGNOSIS — Z4802 Encounter for removal of sutures: Secondary | ICD-10-CM

## 2017-09-29 ENCOUNTER — Encounter: Payer: Self-pay | Admitting: *Deleted

## 2017-09-29 ENCOUNTER — Ambulatory Visit (INDEPENDENT_AMBULATORY_CARE_PROVIDER_SITE_OTHER): Payer: BLUE CROSS/BLUE SHIELD | Admitting: Pharmacist

## 2017-09-29 ENCOUNTER — Telehealth (HOSPITAL_COMMUNITY): Payer: Self-pay

## 2017-09-29 DIAGNOSIS — I480 Paroxysmal atrial fibrillation: Secondary | ICD-10-CM

## 2017-09-29 DIAGNOSIS — Z952 Presence of prosthetic heart valve: Secondary | ICD-10-CM

## 2017-09-29 DIAGNOSIS — Z5181 Encounter for therapeutic drug level monitoring: Secondary | ICD-10-CM

## 2017-09-29 DIAGNOSIS — Z953 Presence of xenogenic heart valve: Secondary | ICD-10-CM | POA: Diagnosis not present

## 2017-09-29 LAB — POCT INR: INR: 2.3

## 2017-09-29 NOTE — Patient Instructions (Signed)
Start taking 1 tablet daily except 1/2 tablet on Tuesday, Thursday, and Saturday. Recheck INR in 1 week. Start a normal dark green consistency.  [facility] [phone] Main [phone]

## 2017-09-29 NOTE — Telephone Encounter (Signed)
2nd attempt to call patient in regards to Insurance - lm on vm. Sending letter.

## 2017-10-02 ENCOUNTER — Other Ambulatory Visit: Payer: Self-pay | Admitting: Thoracic Surgery (Cardiothoracic Vascular Surgery)

## 2017-10-02 DIAGNOSIS — Z952 Presence of prosthetic heart valve: Secondary | ICD-10-CM

## 2017-10-06 ENCOUNTER — Ambulatory Visit (INDEPENDENT_AMBULATORY_CARE_PROVIDER_SITE_OTHER): Payer: BLUE CROSS/BLUE SHIELD | Admitting: Physician Assistant

## 2017-10-06 ENCOUNTER — Ambulatory Visit (INDEPENDENT_AMBULATORY_CARE_PROVIDER_SITE_OTHER): Payer: BLUE CROSS/BLUE SHIELD | Admitting: *Deleted

## 2017-10-06 ENCOUNTER — Other Ambulatory Visit: Payer: Self-pay

## 2017-10-06 ENCOUNTER — Encounter: Payer: Self-pay | Admitting: Physician Assistant

## 2017-10-06 ENCOUNTER — Ambulatory Visit
Admission: RE | Admit: 2017-10-06 | Discharge: 2017-10-06 | Disposition: A | Discharge status: Home / Self Care | Payer: BLUE CROSS/BLUE SHIELD | Source: Ambulatory Visit | Attending: Thoracic Surgery (Cardiothoracic Vascular Surgery) | Admitting: Thoracic Surgery (Cardiothoracic Vascular Surgery)

## 2017-10-06 ENCOUNTER — Telehealth (HOSPITAL_COMMUNITY): Payer: Self-pay

## 2017-10-06 VITALS — BP 108/65 | HR 91 | Resp 18 | Ht 66.0 in | Wt 189.0 lb

## 2017-10-06 DIAGNOSIS — Z953 Presence of xenogenic heart valve: Secondary | ICD-10-CM

## 2017-10-06 DIAGNOSIS — I480 Paroxysmal atrial fibrillation: Secondary | ICD-10-CM

## 2017-10-06 DIAGNOSIS — Z5181 Encounter for therapeutic drug level monitoring: Secondary | ICD-10-CM | POA: Diagnosis not present

## 2017-10-06 DIAGNOSIS — Z952 Presence of prosthetic heart valve: Secondary | ICD-10-CM | POA: Diagnosis not present

## 2017-10-06 LAB — POCT INR: INR: 1.3

## 2017-10-06 MED ORDER — CARVEDILOL 3.125 MG PO TABS: 3.1250 mg | ORAL_TABLET | Freq: Two times a day (BID) | ORAL | Status: DC

## 2017-10-06 NOTE — Patient Instructions (Signed)

## 2017-10-06 NOTE — Telephone Encounter (Signed)
3rd attempt to call patient in regards to Insurance for Cardiac Rehab - lm on vm

## 2017-10-06 NOTE — Patient Instructions (Signed)
Description   Today take 1.5 tablets and 1 tablet then continue  taking 1 tablet daily except 1/2 tablet on Tuesday, Thursday, and Saturday. Recheck INR in 1 week. Continue normal dark green consistency.  [facility] [phone] Main [phone]

## 2017-10-06 NOTE — Progress Notes (Signed)
HPI:  Patient returns for routine postoperative follow-up having undergone a valve-in-valve transcatheter mitral valve replacement via transapical approach by Dr. Roxy Manns on 09/16/2017. Since hospital discharge the patient reports she is feeling much better than prior to surgery. She denies tachy palpitations, shortness of breath, or chest pain.   Current Outpatient Medications  Medication Sig Dispense Refill  . acetaminophen (TYLENOL) 500 MG tablet Take 2 tablets (1,000 mg total) by mouth every 6 (six) hours. 30 tablet 0  . albuterol (PROVENTIL HFA;VENTOLIN HFA) 108 (90 Base) MCG/ACT inhaler Inhale 2 puffs into the lungs every 4 (four) hours as needed for wheezing or shortness of breath. 1 Inhaler 1  . aspirin EC 81 MG EC tablet Take 1 tablet (81 mg total) by mouth daily.    Marland Kitchen atorvastatin (LIPITOR) 80 MG tablet Take 1 tablet (80 mg total) by mouth daily at 6 PM. 30 tablet 1  . b complex vitamins tablet Take 1 tablet by mouth 2 (two) times daily.     . cholecalciferol (VITAMIN D-400) 400 UNITS TABS tablet Take 400 Units by mouth 2 (two) times daily.    . diphenhydrAMINE (BENADRYL) 25 MG tablet Take 25 mg by mouth every 6 (six) hours as needed for itching or allergies.     . Diphenhydramine-APAP (PERCOGESIC EXTRA STRENGTH) 12.5-500 MG TABS Take 2 tablets by mouth 2 (two) times daily as needed (for pain.). PERCOGESIC    . furosemide (LASIX) 20 MG tablet Take 20 mg by mouth daily.    . Multiple Vitamin (MULTIVITAMIN WITH MINERALS) TABS tablet Take 1 tablet by mouth daily.    Marland Kitchen oxyCODONE (OXY IR/ROXICODONE) 5 MG immediate release tablet Take 1-2 tablets (5-10 mg total) by mouth every 6 (six) hours as needed for severe pain. 30 tablet 0  . spironolactone (ALDACTONE) 25 MG tablet Take 1 tablet (25 mg total) by mouth daily. 90 tablet 3  . Vitamin D, Ergocalciferol, (DRISDOL) 50000 units CAPS capsule TAKE 1 CAPSULE (50,000 UNITS TOTAL) BY MOUTH EVERY 7 (SEVEN) DAYS. (Patient taking differently: Take  50,000 Units by mouth every Thursday. In the morning) 24 capsule 2  . warfarin (COUMADIN) 5 MG tablet Take 1 tablet (5 mg total) by mouth daily at 6 PM. As directed by the coumadin clinic 100 tablet 1  . zolpidem (AMBIEN) 5 MG tablet TAKE 1 TABLET BY MOUTH AT BEDTIME 30 tablet 2  Vital Signs: BP 108/65 HR 91 RR 18 Oxygenation 98% on room air.   Physical Exam: CV-RRR, no murmur Pulmonary-Clear to auscultation bilaterally Extremities-No LE edema Wound-Clean and dry  Diagnostic Tests:   Impression and Plan: Overall, she is recovering well from valve in valve transcatheter mitral valve replacement. Patient states she has felt her heart beating faster than normal. She denies tachy palpations and states it feels regular not irregular. She decided to take Coreg, which she had been on prior to surgery. She has not felt her heart racing, has not had any dizziness or lightheadedness. I confirmed with Dr. Roxy Manns that taking Coreg 3.125 mg bid is ok. She has an appointment to see Dr. Burt Knack as well as have an echo done on 10/31/2017. Regarding Coumadin, her latest INR was 2.3. She has an appointment to have her PT and INR checked again today. She is not taking any narcotics for pain. She was instructed she may begin driving short distances (ie. 30 minutes or less during the day) and increase the frequency and duration as tolerates. She has been contacted by cardiac rehab, but does  not wish to participate at this time. We did discuss endocarditis prophylaxis prior to any dental cleaning etc. 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 She wishes to return to work and I told her she may but might want to not work a full day this week yet. She will return to see Dr. Roxy Manns in 6 weeks.    Nani Skillern, PA-C [facility] [phone]

## 2017-10-07 LAB — CUP PACEART REMOTE DEVICE CHECK
Implantable Lead Implant Date: 20110503
Implantable Lead Location: 753859
Implantable Pulse Generator Implant Date: 20110503
MDC IDC LEAD IMPLANT DT: 20130815
MDC IDC LEAD LOCATION: 753860
MDC IDC PG SERIAL: 778164
MDC IDC SESS DTM: 20190416104911

## 2017-10-13 ENCOUNTER — Ambulatory Visit (INDEPENDENT_AMBULATORY_CARE_PROVIDER_SITE_OTHER): Payer: BLUE CROSS/BLUE SHIELD | Admitting: *Deleted

## 2017-10-13 DIAGNOSIS — Z5181 Encounter for therapeutic drug level monitoring: Secondary | ICD-10-CM | POA: Diagnosis not present

## 2017-10-13 DIAGNOSIS — I480 Paroxysmal atrial fibrillation: Secondary | ICD-10-CM

## 2017-10-13 DIAGNOSIS — Z953 Presence of xenogenic heart valve: Secondary | ICD-10-CM | POA: Diagnosis not present

## 2017-10-13 DIAGNOSIS — Z952 Presence of prosthetic heart valve: Secondary | ICD-10-CM

## 2017-10-13 LAB — POCT INR: INR: 1.4

## 2017-10-13 NOTE — Patient Instructions (Signed)
Description   Today take 1.5 tablets and 1 tablet then start taking 1 tablet daily except 1/2 tablet on Tuesday. Recheck INR in 1 week. Continue normal dark green consistency.  [facility]in Clinic [phone] Main [phone]

## 2017-10-14 ENCOUNTER — Telehealth (HOSPITAL_COMMUNITY): Payer: Self-pay

## 2017-10-14 NOTE — Telephone Encounter (Signed)
No response from patient - Closed referral. °

## 2017-10-20 ENCOUNTER — Ambulatory Visit (INDEPENDENT_AMBULATORY_CARE_PROVIDER_SITE_OTHER): Payer: BLUE CROSS/BLUE SHIELD | Admitting: *Deleted

## 2017-10-20 DIAGNOSIS — Z953 Presence of xenogenic heart valve: Secondary | ICD-10-CM

## 2017-10-20 DIAGNOSIS — Z5181 Encounter for therapeutic drug level monitoring: Secondary | ICD-10-CM

## 2017-10-20 DIAGNOSIS — I480 Paroxysmal atrial fibrillation: Secondary | ICD-10-CM | POA: Diagnosis not present

## 2017-10-20 DIAGNOSIS — Z952 Presence of prosthetic heart valve: Secondary | ICD-10-CM

## 2017-10-20 LAB — POCT INR: INR: 1.7

## 2017-10-20 NOTE — Patient Instructions (Signed)
Description   Today April 29th  take 1 and 1/2 tablets then change coumadin dose to   1 tablet daily . Recheck INR in 10 days. Continue normal dark green consistency.  [facility]in Clinic [phone] Main [phone]

## 2017-10-23 ENCOUNTER — Other Ambulatory Visit: Payer: Self-pay | Admitting: Hematology & Oncology

## 2017-10-23 DIAGNOSIS — M2550 Pain in unspecified joint: Secondary | ICD-10-CM

## 2017-10-29 ENCOUNTER — Other Ambulatory Visit: Payer: Self-pay | Admitting: Internal Medicine

## 2017-10-29 DIAGNOSIS — I5022 Chronic systolic (congestive) heart failure: Secondary | ICD-10-CM

## 2017-10-29 DIAGNOSIS — I472 Ventricular tachycardia: Secondary | ICD-10-CM

## 2017-10-29 DIAGNOSIS — Z95810 Presence of automatic (implantable) cardiac defibrillator: Secondary | ICD-10-CM

## 2017-10-29 DIAGNOSIS — I4729 Other ventricular tachycardia: Secondary | ICD-10-CM

## 2017-10-31 ENCOUNTER — Other Ambulatory Visit: Payer: Self-pay

## 2017-10-31 ENCOUNTER — Ambulatory Visit (INDEPENDENT_AMBULATORY_CARE_PROVIDER_SITE_OTHER): Payer: BLUE CROSS/BLUE SHIELD

## 2017-10-31 ENCOUNTER — Encounter (HOSPITAL_COMMUNITY): Payer: Self-pay | Admitting: *Deleted

## 2017-10-31 ENCOUNTER — Ambulatory Visit (INDEPENDENT_AMBULATORY_CARE_PROVIDER_SITE_OTHER): Payer: BLUE CROSS/BLUE SHIELD | Admitting: Cardiovascular Disease

## 2017-10-31 ENCOUNTER — Encounter: Payer: Self-pay | Admitting: Cardiovascular Disease

## 2017-10-31 ENCOUNTER — Ambulatory Visit (HOSPITAL_COMMUNITY): Payer: BLUE CROSS/BLUE SHIELD | Attending: Cardiology

## 2017-10-31 VITALS — BP 102/70 | HR 82 | Ht 66.0 in | Wt 194.0 lb

## 2017-10-31 DIAGNOSIS — E785 Hyperlipidemia, unspecified: Secondary | ICD-10-CM | POA: Insufficient documentation

## 2017-10-31 DIAGNOSIS — Z5181 Encounter for therapeutic drug level monitoring: Secondary | ICD-10-CM | POA: Diagnosis not present

## 2017-10-31 DIAGNOSIS — Z952 Presence of prosthetic heart valve: Secondary | ICD-10-CM

## 2017-10-31 DIAGNOSIS — T8209XD Other mechanical complication of heart valve prosthesis, subsequent encounter: Secondary | ICD-10-CM

## 2017-10-31 DIAGNOSIS — I480 Paroxysmal atrial fibrillation: Secondary | ICD-10-CM | POA: Diagnosis not present

## 2017-10-31 DIAGNOSIS — I252 Old myocardial infarction: Secondary | ICD-10-CM | POA: Diagnosis not present

## 2017-10-31 DIAGNOSIS — Z8249 Family history of ischemic heart disease and other diseases of the circulatory system: Secondary | ICD-10-CM | POA: Insufficient documentation

## 2017-10-31 DIAGNOSIS — Z953 Presence of xenogenic heart valve: Secondary | ICD-10-CM | POA: Diagnosis not present

## 2017-10-31 LAB — ECHOCARDIOGRAM COMPLETE
HEIGHTINCHES: 66 in
Weight: 3104 oz

## 2017-10-31 LAB — POCT INR: INR: 1.7

## 2017-10-31 MED ORDER — AMOXICILLIN 500 MG PO TABS: 2000.0000 mg | ORAL_TABLET | ORAL | Status: DC

## 2017-10-31 NOTE — Patient Instructions (Signed)
Description   Take 1.5 tablets today, then start taking 1 tablet daily except 1.5 tablets on Mondays and Fridays. Recheck INR in 10 days. Continue normal dark green consistency.  C[facility]madin Clinic [phone] Main [phone]

## 2017-10-31 NOTE — Progress Notes (Signed)
CARDIOLOGY OFFICE NOTE Date:  11/02/2017   ID:  [name], DOB 01-Jul-1960, MRN [id number]  PCP:  Patient, No Pcp Per   Chief Complaint  Patient presents with  . Shortness of Breath     HISTORY OF PRESENT ILLNESS: [name] is a 57 y.o. female who presents today for 30 day TMVR follow-up.  The patient has a history of inferior MI in 6269 complicated by postinfarction papillary muscle rupture and LV aneurysm.  She underwent urgent mitral valve replacement with a bioprosthetic tissue valve and repair of LV aneurysm.  She was originally treated with a 29 mm Edwards Paramount bovine stented pericardial valve.  She developed symptoms of progressive combined systolic and diastolic heart failure and was found to have severe bioprosthetic mitral stenosis with a mean gradient of 21 mmHg and reduced LV function with an ejection fraction of 35 to 40%.  Because of recurrent ovarian cancer with slow progression, the multidisciplinary heart team recommended transcatheter mitral valve in valve replacement for treatment of her severe mitral stenosis.  The patient was treated with a 29 mm Sapien 3 valve via a trans-apical approach on 09/16/2017.   The patient's postoperative echo demonstrated elevated mean gradients across the valve at 15 mmHg with valve area by pressure half-time of 1.5 cm and valve area by continuity equation of 2.0 cm.  Despite the patient's elevated mean gradient, the color flow Doppler demonstrated laminar flow.  The patient was treated with warfarin.  She returns today for follow-up evaluation.  The patient has done very well over the past month.  She went back to work full-time after about 3 weeks.  Her chest pain has resolved around the incision site.  Her breathing is markedly improved and she now only complains of mild shortness of breath with activity.  She denies chest pain or pressure.  No lightheadedness, heart palpitations, or syncope.  She is tolerating warfarin but  has had some trouble maintaining a therapeutic INR.  Past Medical History:  Diagnosis Date  . Arthritis    "everywhere"  . Asthma    as a child  . Automatic implantable cardioverter-defibrillator in situ    DR. Beckie Salts   . Bladder cancer [facility]) 2009   "injected medicine to get rid of it"  . Chronic systolic CHF (congestive heart failure) ([place])   . CKD (chronic kidney disease), stage III ([place])   . Coronary artery disease    a. large RV/inferior MI complicated by pseudoaneurysm s/p aneurysemectomy and CABG 10/2009 with papillary muscle rupture requiring bioprosthetic mitral valve replacement in [name] [place].  . Granulosa cell carcinoma of ovary ([place])    recurrent - surgical resection 2007, 2014 and 2016   . H/O thymectomy   . High cholesterol   . Hypothyroidism   . ICD (implantable cardiac defibrillator) in place   . Ischemic cardiomyopathy    a.  ICM and VT arrest 10/2009 s/p AICD (revision 01/2012 due to RV lead problem).  . Myocardial infarction ([name]) 10/14/2009  . Neuropathy    FEET AND HANDS - FROM CHEMO - BUT MUCH IMPROVED  . PAF (paroxysmal atrial fibrillation) ([name])   . Pain    JOINT PAINS AND MUSCLE ACHES ALL OVER.  Marland Kitchen Port-A-Cath in place    RIGHT UPPER CHEST  . Prosthetic valve dysfunction   . S/P mitral valve replacement with bioprosthetic valve 10/21/2009   Edwards Perimount stented bovine pericardial tissue valve, size 29 mm  . S/P valve-in-valve transcatheter mitral valve replacement  09/16/2017   29 mm Edwards Sapien 3 transcatheter heart valve placed via transapical approach  . SVT (supraventricular tachycardia) ([name])   . Tobacco abuse   . Ventricular tachycardia ([name])     Current Outpatient Medications  Medication Sig Dispense Refill  . acetaminophen (TYLENOL) 500 MG tablet Take 2 tablets (1,000 mg total) by mouth every 6 (six) hours. 30 tablet 0  . albuterol (PROVENTIL HFA;VENTOLIN HFA) 108 (90 Base) MCG/ACT inhaler Inhale 2 puffs into the lungs every 4 (four)  hours as needed for wheezing or shortness of breath. 1 Inhaler 1  . aspirin EC 81 MG EC tablet Take 1 tablet (81 mg total) by mouth daily.    Marland Kitchen atorvastatin (LIPITOR) 80 MG tablet Take 1 tablet (80 mg total) by mouth daily at 6 PM. 30 tablet 1  . b complex vitamins tablet Take 1 tablet by mouth 2 (two) times daily.     . carvedilol (COREG) 3.125 MG tablet Take 1 tablet (3.125 mg total) by mouth 2 (two) times daily with a meal.    . cholecalciferol (VITAMIN D-400) 400 UNITS TABS tablet Take 400 Units by mouth 2 (two) times daily.    . diphenhydrAMINE (BENADRYL) 25 MG tablet Take 25 mg by mouth every 6 (six) hours as needed for itching or allergies.     . Diphenhydramine-APAP (PERCOGESIC EXTRA STRENGTH) 12.5-500 MG TABS Take 2 tablets by mouth 2 (two) times daily as needed (for pain.). PERCOGESIC    . furosemide (LASIX) 20 MG tablet TAKE 1 TAB BY MOUTH DAILY. MAY TAKE AN EXTRA TAB AS NEEDED FOR LEG SWELLING OR SHORTNESS OF BREATH 135 tablet 2  . Multiple Vitamin (MULTIVITAMIN WITH MINERALS) TABS tablet Take 1 tablet by mouth daily.    Marland Kitchen spironolactone (ALDACTONE) 25 MG tablet Take 1 tablet (25 mg total) by mouth daily. 90 tablet 3  . Vitamin D, Ergocalciferol, (DRISDOL) 50000 units CAPS capsule TAKE 1 CAPSULE (50,000 UNITS TOTAL) BY MOUTH EVERY 7 (SEVEN) DAYS. 24 capsule 1  . warfarin (COUMADIN) 5 MG tablet Take 1 tablet (5 mg total) by mouth daily at 6 PM. As directed by the coumadin clinic 100 tablet 1  . zolpidem (AMBIEN) 5 MG tablet TAKE 1 TABLET BY MOUTH AT BEDTIME 30 tablet 2  . amoxicillin (AMOXIL) 500 MG tablet Take 4 tablets (2,000 mg total) by mouth as directed. Take one hour prior to dental visits.     No current facility-administered medications for this visit.     ALLERGIES:   Prochlorperazine edisylate; Adhesive [tape]; and Prochlorperazine   SOCIAL HISTORY:  The patient  reports that she has been smoking cigarettes.  She started smoking about 43 years ago. She has a 15.00 pack-year  smoking history. She has never used smokeless tobacco. She reports that she does not drink alcohol or use drugs.   FAMILY HISTORY:  The patient's family history includes Heart attack in her brother, maternal grandfather, and paternal grandfather; Stroke in her maternal grandmother.    REVIEW OF SYSTEMS:  All other systems are reviewed and negative.   PHYSICAL EXAM: VS:  BP 102/70   Pulse 82   Ht 5\' 6"  (1.676 m)   Wt 194 lb (88 kg)   BMI 31.31 kg/m  , BMI Body mass index is 31.31 kg/m. GEN: Well nourished, well developed, in no acute distress HEENT: normal Neck: No JVD.  Cardiac: The heart is RRR with a 2/6 systolic murmur at the LLSB. No edema. Pedal pulses 2+ = bilaterally  Respiratory:  clear to auscultation bilaterally GI: soft, nontender, nondistended, + BS MS: no deformity or atrophy Skin: warm and dry, no rash Neuro:  Strength and sensation are intact Psych: euthymic mood, full affect  EKG:  EKG is not ordered today.  RECENT LABS: 09/12/2017: ALT 13; B Natriuretic Peptide 327.3 09/17/2017: Magnesium 1.6 09/18/2017: BUN 13; Creatinine, Ser 1.21; Hemoglobin 10.9; Platelets 97; Potassium 3.7; Sodium 137  09/18/2017: Cholesterol 247; HDL 37; LDL Cholesterol 181; Total CHOL/HDL Ratio 6.7; Triglycerides 144; VLDL 29   CrCl cannot be calculated (Patient's most recent lab result is older than the maximum 21 days allowed.).   Wt Readings from Last 3 Encounters:  10/31/17 194 lb (88 kg)  10/06/17 189 lb (85.7 kg)  09/19/17 195 lb 3.2 oz (88.5 kg)     CARDIAC STUDIES:  Today's echo study is personally reviewed.  Formal interpretation is pending.  Transcatheter mitral flow characteristics are improved, now with a mean gradient of 8 mmHg.  There is no paravalvular regurgitation.  ASSESSMENT AND PLAN: Mitral valve disease s/p valve-in-valve TMVR. The patient has NYHA functional class 2 symptoms of chronic combined systolic and diastolic heart failure. Today's echo study is  personally reviewed and demonstrates stable findings of segmental LV dysfunction with an akinetic basal inferior wall (prior surgical patch repair), and improved trans-mitral gradients with a mean gradient of 8 mmHg. LV outflow appears normal with no evidence of obstruction. While there is limited data on anticoagulation after TMVR, it might be reasonable to treat her with warfarin and ASA for a period of 6 months, then ASA 325 mg daily thereafter.  I will discuss her antiplatelet/anticoagulation strategy with Dr. Roxy Manns and Dr Tamala Julian.  She should follow lifelong SBE prophylaxis.  She has follow-up arranged with Dr. Tamala Julian later this month.  Labs/ tests ordered today include:   Disposition:   FU as scheduled. Will arrange Valve Clinic follow-up in one year.  Deatra James 11/02/2017 8:21 PM     [facility] [address] [place] [address]  [phone] (office) [phone] (fax)

## 2017-10-31 NOTE — Progress Notes (Unsigned)
Definity Imaging Enhancer is not needed for echo evaluation of 2 chamber views per Dr. Burt Knack (area showed concern for possible aneurysm by echo, however not documented in prior studies) .  The patient has a known Inferior Infarct which is followed routinely by CT.    Deliah Boston, RDCS

## 2017-10-31 NOTE — Patient Instructions (Addendum)
Medication Instructions:  Your physician discussed the importance of taking an antibiotic prior to any dental, gastrointestinal, genitourinary procedures to prevent damage to the heart valves from infection. You were given a prescription for an antibiotic based on current SBE prophylaxis guidelines. You have been given a prescription for AMOXIL 2,000 mg to take one hour prior to dental visits.  Labwork: None  Testing/Procedures: Your provider has requested that you have an echocardiogram in one year. Echocardiography is a painless test that uses sound waves to create images of your heart. It provides your doctor with information about the size and shape of your heart and how well your heart's chambers and valves are working. This procedure takes approximately one hour. There are no restrictions for this procedure.    Follow-Up: Your provider recommends that you schedule a follow-up appointment with the Structural Heart Team in one year. We will call you to arrange your visit and your echo.  Any Other Special Instructions Will Be Listed Below (If Applicable).     If you need a refill on your cardiac medications before your next appointment, please call your pharmacy.

## 2017-11-10 ENCOUNTER — Ambulatory Visit (INDEPENDENT_AMBULATORY_CARE_PROVIDER_SITE_OTHER): Payer: BLUE CROSS/BLUE SHIELD | Admitting: *Deleted

## 2017-11-10 DIAGNOSIS — Z5181 Encounter for therapeutic drug level monitoring: Secondary | ICD-10-CM | POA: Diagnosis not present

## 2017-11-10 DIAGNOSIS — I480 Paroxysmal atrial fibrillation: Secondary | ICD-10-CM | POA: Diagnosis not present

## 2017-11-10 DIAGNOSIS — Z953 Presence of xenogenic heart valve: Secondary | ICD-10-CM | POA: Diagnosis not present

## 2017-11-10 DIAGNOSIS — Z952 Presence of prosthetic heart valve: Secondary | ICD-10-CM

## 2017-11-10 LAB — POCT INR: INR: 2

## 2017-11-10 NOTE — Patient Instructions (Addendum)
Description   Start taking 1 tablet daily except 1.5 tablets on Mondays, Wednesdays, and Fridays. Recheck INR in 11 days. Continue normal dark green consistency.  Coumadin Clinic [phone] Main [phone]

## 2017-11-12 ENCOUNTER — Other Ambulatory Visit: Payer: Self-pay | Admitting: Hematology & Oncology

## 2017-11-14 ENCOUNTER — Other Ambulatory Visit: Payer: Self-pay | Admitting: Hematology & Oncology

## 2017-11-20 NOTE — Progress Notes (Signed)
Cardiology Office Note    Date:  11/21/2017   ID:  Stacy, Ritter May 28, 1961, MRN [id number]  PCP:  Patient, No Pcp Per  Cardiologist: Sinclair Grooms, MD   Chief Complaint  Patient presents with  . Cardiac Valve Problem    History of Present Illness:  [name] is a 57 y.o. female  transcatheter mitral valve in valve replacement for treatment of her severe mitral stenosis 08/2017 (Valve in valve)  Overall, she is markedly improved since valve in valve treatment of mitral stenosis in March.  She denies angina.  There is no orthopnea.  She has not noticed blood in the urine or stool.  I do note the conversation about anticoagulation raised by Dr. Burt Knack on the recent valve team office note.   Past Medical History:  Diagnosis Date  . Arthritis    "everywhere"  . Asthma    as a child  . Automatic implantable cardioverter-defibrillator in situ    DR. Beckie Salts   . Bladder cancer [facility]) 2009   "injected medicine to get rid of it"  . Chronic systolic CHF (congestive heart failure) ([place])   . CKD (chronic kidney disease), stage III ([place])   . Coronary artery disease    a. large RV/inferior MI complicated by pseudoaneurysm s/p aneurysemectomy and CABG 10/2009 with papillary muscle rupture requiring bioprosthetic mitral valve replacement in [facility].  . Granulosa cell carcinoma of ovary ([place])    recurrent - surgical resection 2007, 2014 and 2016   . H/O thymectomy   . High cholesterol   . Hypothyroidism   . ICD (implantable cardiac defibrillator) in place   . Ischemic cardiomyopathy    a.  ICM and VT arrest 10/2009 s/p AICD (revision 01/2012 due to RV lead problem).  . Myocardial infarction ([place]) 10/14/2009  . Neuropathy    FEET AND HANDS - FROM CHEMO - BUT MUCH IMPROVED  . PAF (paroxysmal atrial fibrillation) ([place])   . Pain    JOINT PAINS AND MUSCLE ACHES ALL OVER.  Marland Kitchen Port-A-Cath in place    RIGHT UPPER CHEST  . Prosthetic valve dysfunction   . S/P mitral  valve replacement with bioprosthetic valve 10/21/2009   Edwards Perimount stented bovine pericardial tissue valve, size 29 mm  . S/P valve-in-valve transcatheter mitral valve replacement 09/16/2017   29 mm Edwards Sapien 3 transcatheter heart valve placed via transapical approach  . SVT (supraventricular tachycardia) ([place])   . Tobacco abuse   . Ventricular tachycardia [facility])     Past Surgical History:  Procedure Laterality Date  . ABDOMINAL HYSTERECTOMY N/A 07/27/2013   Procedure: TUMOR EXCISION OF ABDOMINAL MASS;  Surgeon: Alvino Chapel, MD;  Location: [facility] ORS;  Service: Gynecology;  Laterality: N/A;  . APPENDECTOMY  1989  . BILATERAL OOPHORECTOMY  2007  . CARDIAC DEFIBRILLATOR PLACEMENT  02/06/2012   "lead change"  . CARDIAC VALVE REPLACEMENT    . CYSTOSCOPY Right 02/23/2013   Procedure: CYSTOSCOPY WITH STENT REMOVAL;  Surgeon: Alvino Chapel, MD;  Location: [facility];  Service: Gynecology;  Laterality: Right;  . IMPLANTABLE CARDIOVERTER DEFIBRILLATOR REVISION N/A 02/06/2012   Procedure: IMPLANTABLE CARDIOVERTER DEFIBRILLATOR REVISION;  Surgeon: Evans Lance, MD;  Location: [facility] CATH LAB;  Service: Cardiovascular;  Laterality: N/A;  . INSERT / REPLACE / REMOVE PACEMAKER  10/2009   DUAL-CHAMBER St. JUDE DEFIBRILLATOR  . LAPAROTOMY N/A 01/19/2013   Procedure: TUMOR DEBULKING / BOWEL RESECTION/INSERTION RIGHT UTERERAL DOUBLE J STENT/OMENTECTOMY;  Surgeon: Alvino Chapel,  MD;  Location: [facility];  Service: Gynecology;  Laterality: N/A;  . MITRAL VALVE REPLACEMENT  10/21/2009   67mm Edwards Perimount bovine pericardial tissue valve - [facility]  . RIGHT/LEFT HEART CATH AND CORONARY ANGIOGRAPHY N/A 07/02/2017   Procedure: RIGHT/LEFT HEART CATH AND CORONARY ANGIOGRAPHY;  Surgeon: Belva Crome, MD;  Location: [place] CV LAB;  Service: Cardiovascular;  Laterality: N/A;  . TEE WITHOUT CARDIOVERSION N/A 09/16/2017   Procedure: TRANSESOPHAGEAL ECHOCARDIOGRAM (TEE);  Surgeon:  [name], MD;  Location: [place];  Service: Open Heart Surgery;  Laterality: N/A;  . THYMECTOMY  2009  . TONSILLECTOMY AND ADENOIDECTOMY  ~ 1967  . TOTAL HIP ARTHROPLASTY  05/06/2012   Procedure: TOTAL HIP ARTHROPLASTY;  Surgeon: Gearlean Alf, MD;  Location: [facility];  Service: Orthopedics;  Laterality: Right;  . TRANSCATHETER MITRAL VALVE REPLACEMENT, TRANSAPICAL N/A 09/16/2017   Procedure: TRANSCATHETER MITRAL VALVE REPLACEMENT,TRANSAPICAL;  Surgeon: [name], MD;  Location: [place];  Service: Open Heart Surgery;  Laterality: N/A;  . TRANSURETHRAL RESECTION OF BLADDER  2009   "for bladder cancer"  . TUBAL LIGATION  1993  . VAGINAL HYSTERECTOMY  2001    Current Medications: Outpatient Medications Prior to Visit  Medication Sig Dispense Refill  . acetaminophen (TYLENOL) 500 MG tablet Take 2 tablets (1,000 mg total) by mouth every 6 (six) hours. 30 tablet 0  . albuterol (PROVENTIL HFA;VENTOLIN HFA) 108 (90 Base) MCG/ACT inhaler Inhale 2 puffs into the lungs every 4 (four) hours as needed for wheezing or shortness of breath. 1 Inhaler 1  . amoxicillin (AMOXIL) 500 MG tablet Take 4 tablets (2,000 mg total) by mouth as directed. Take one hour prior to dental visits.    Marland Kitchen aspirin EC 81 MG EC tablet Take 1 tablet (81 mg total) by mouth daily.    Marland Kitchen atorvastatin (LIPITOR) 80 MG tablet Take 1 tablet (80 mg total) by mouth daily at 6 PM. 30 tablet 1  . b complex vitamins tablet Take 1 tablet by mouth 2 (two) times daily.     . carvedilol (COREG) 3.125 MG tablet Take 1 tablet (3.125 mg total) by mouth 2 (two) times daily with a meal.    . cholecalciferol (VITAMIN D-400) 400 UNITS TABS tablet Take 400 Units by mouth 2 (two) times daily.    . diphenhydrAMINE (BENADRYL) 25 MG tablet Take 25 mg by mouth every 6 (six) hours as needed for itching or allergies.     . Diphenhydramine-APAP (PERCOGESIC EXTRA STRENGTH) 12.5-500 MG TABS Take 2 tablets by mouth 2 (two) times daily as needed (for pain.).  PERCOGESIC    . furosemide (LASIX) 20 MG tablet TAKE 1 TAB BY MOUTH DAILY. MAY TAKE AN EXTRA TAB AS NEEDED FOR LEG SWELLING OR SHORTNESS OF BREATH 135 tablet 2  . LORazepam (ATIVAN) 0.5 MG tablet TAKE 1 TABLET BY MOUTH EVERY 6 HOURS AS NEEDED FOR NAUSEA AND VOMITING 60 tablet 0  . Multiple Vitamin (MULTIVITAMIN WITH MINERALS) TABS tablet Take 1 tablet by mouth daily.    Marland Kitchen spironolactone (ALDACTONE) 25 MG tablet Take 1 tablet (25 mg total) by mouth daily. 90 tablet 3  . Vitamin D, Ergocalciferol, (DRISDOL) 50000 units CAPS capsule TAKE 1 CAPSULE (50,000 UNITS TOTAL) BY MOUTH EVERY 7 (SEVEN) DAYS. 24 capsule 1  . warfarin (COUMADIN) 5 MG tablet Take 1 tablet (5 mg total) by mouth daily at 6 PM. As directed by the coumadin clinic 100 tablet 1  . zolpidem (AMBIEN) 5 MG tablet TAKE 1  TABLET BY MOUTH EVERYDAY AT BEDTIME 30 tablet 2   No facility-administered medications prior to visit.      Allergies:   Prochlorperazine edisylate; Adhesive [tape]; and Prochlorperazine   Social History   Socioeconomic History  . Marital status: Divorced    Spouse name: Not on file  . Number of children: Not on file  . Years of education: Not on file  . Highest education level: Not on file  Occupational History    Comment: WORKS FULL TIME  Social Needs  . Financial resource strain: Not on file  . Food insecurity:    Worry: Not on file    Inability: Not on file  . Transportation needs:    Medical: Not on file    Non-medical: Not on file  Tobacco Use  . Smoking status: Current Some Day Smoker    Packs/day: 0.50    Years: 30.00    Pack years: 15.00    Types: Cigarettes    Start date: 03/28/1974  . Smokeless tobacco: Never Used  Substance and Sexual Activity  . Alcohol use: No    Alcohol/week: 0.0 oz  . Drug use: No  . Sexual activity: Not on file  Lifestyle  . Physical activity:    Days per week: Not on file    Minutes per session: Not on file  . Stress: Not on file  Relationships  . Social  connections:    Talks on phone: Not on file    Gets together: Not on file    Attends religious service: Not on file    Active member of club or organization: Not on file    Attends meetings of clubs or organizations: Not on file    Relationship status: Not on file  Other Topics Concern  . Not on file  Social History Narrative   WORKS FULL TIME   SINGLE   TOBACCO USE-YES   IMPLANTATION OF DUAL-CHAMBER St. JUDE DEFIBRILLATOR     Family History:  The patient's family history includes Heart attack in her brother, maternal grandfather, and paternal grandfather; Stroke in her maternal grandmother.   ROS:   Please see the history of present illness.    She has not had blood in her urine or stool. All other systems reviewed and are negative.   PHYSICAL EXAM:   VS:  BP 100/68   Pulse 84   Ht 5\' 6"  (1.676 m)   Wt 196 lb 12.8 oz (89.3 kg)   BMI 31.76 kg/m    GEN: Well nourished, well developed, in no acute distress  HEENT: normal  Neck: no JVD, carotid bruits, or masses Cardiac: RRR; no murmurs, rubs, or gallops,no edema  Respiratory:  clear to auscultation bilaterally, normal work of breathing GI: soft, nontender, nondistended, + BS MS: no deformity or atrophy  Skin: warm and dry, no rash Neuro:  Alert and Oriented x 3, Strength and sensation are intact Psych: euthymic mood, full affect  Wt Readings from Last 3 Encounters:  11/21/17 196 lb 12.8 oz (89.3 kg)  10/31/17 194 lb (88 kg)  10/06/17 189 lb (85.7 kg)      Studies/Labs Reviewed:   EKG:  EKG not repeated  Recent Labs: 09/12/2017: ALT 13; B Natriuretic Peptide 327.3 09/17/2017: Magnesium 1.6 09/18/2017: BUN 13; Creatinine, Ser 1.21; Hemoglobin 10.9; Platelets 97; Potassium 3.7; Sodium 137   Lipid Panel    Component Value Date/Time   CHOL 247 (H) 09/18/2017 0240   TRIG 144 09/18/2017 0240   HDL 37 (L)  09/18/2017 0240   CHOLHDL 6.7 09/18/2017 0240   VLDL 29 09/18/2017 0240   LDLCALC 181 (H) 09/18/2017 0240     Additional studies/ records that were reviewed today include:  Recent echo was personally reviewed.    ASSESSMENT:    1. S/P valve-in-valve transcatheter mitral valve replacement   2. Paroxysmal atrial fibrillation (HCC)   3. Simple chronic bronchitis ([place])   4. CKD (chronic kidney disease) stage 3, GFR 30-59 ml/min (HCC)   5. Chronic combined systolic (congestive) and diastolic (congestive) heart failure ([place])   6. Automatic implantable cardioverter-defibrillator in situ      PLAN:  In order of problems listed above:  1. Doing well with marked improvement in heart few symptoms since valve and valve performed via left lateral thoracotomy in March. 2. I need to investigate when she actually had atrial fibrillation.  If I am not mistaken it may have been picked up on telemetry of her AICD.  If she is actually ever had atrial fibrillation other than postop, I would be in favor of continuing anticoagulation therapy with Coumadin indefinitely given her mitral valve status, increased gradient across the valve, and heart failure. 3. Not discussed 4. Stable 5. No evidence of volume overload on current exam 6. Not discussed  We will plan to see her at a distance perhaps in 6 months.  Her next visit will be with the valve clinic.  Also noted they will see her one year after the procedure and the spring 2020.  I would be in favor of continuing Coumadin therapy.  Will discuss with Dr. Burt Knack.    Medication Adjustments/Labs and Tests Ordered: Current medicines are reviewed at length with the patient today.  Concerns regarding medicines are outlined above.  Medication changes, Labs and Tests ordered today are listed in the Patient Instructions below. Patient Instructions  Medication Instructions:  None  Labwork: None  Testing/Procedures: None  Follow-Up: Your physician wants you to follow-up in: December with Dr. Tamala Julian.  You will receive a reminder letter in the mail two months in  advance. If you don't receive a letter, please call our office to schedule the follow-up appointment.   Any Other Special Instructions Will Be Listed Below (If Applicable).     If you need a refill on your cardiac medications before your next appointment, please call your pharmacy.      Signed, Sinclair Grooms, MD  11/21/2017 12:22 PM    [facility] [place], [place], [place]  [address] Phone: [phone]; Fax: [phone]

## 2017-11-21 ENCOUNTER — Encounter: Payer: Self-pay | Admitting: Interventional Cardiology

## 2017-11-21 ENCOUNTER — Ambulatory Visit (INDEPENDENT_AMBULATORY_CARE_PROVIDER_SITE_OTHER): Payer: BLUE CROSS/BLUE SHIELD | Admitting: Interventional Cardiology

## 2017-11-21 ENCOUNTER — Ambulatory Visit (INDEPENDENT_AMBULATORY_CARE_PROVIDER_SITE_OTHER): Payer: BLUE CROSS/BLUE SHIELD | Admitting: *Deleted

## 2017-11-21 VITALS — BP 100/68 | HR 84 | Ht 66.0 in | Wt 196.8 lb

## 2017-11-21 DIAGNOSIS — Z95810 Presence of automatic (implantable) cardiac defibrillator: Secondary | ICD-10-CM

## 2017-11-21 DIAGNOSIS — Z5181 Encounter for therapeutic drug level monitoring: Secondary | ICD-10-CM

## 2017-11-21 DIAGNOSIS — Z952 Presence of prosthetic heart valve: Secondary | ICD-10-CM

## 2017-11-21 DIAGNOSIS — N183 Chronic kidney disease, stage 3 unspecified: Secondary | ICD-10-CM

## 2017-11-21 DIAGNOSIS — I5042 Chronic combined systolic (congestive) and diastolic (congestive) heart failure: Secondary | ICD-10-CM

## 2017-11-21 DIAGNOSIS — I480 Paroxysmal atrial fibrillation: Secondary | ICD-10-CM

## 2017-11-21 DIAGNOSIS — Z953 Presence of xenogenic heart valve: Secondary | ICD-10-CM

## 2017-11-21 DIAGNOSIS — J410 Simple chronic bronchitis: Secondary | ICD-10-CM

## 2017-11-21 LAB — POCT INR: INR: 2.2 (ref 2.0–3.0)

## 2017-11-21 NOTE — Patient Instructions (Signed)
Medication Instructions:  None  Labwork: None  Testing/Procedures: None  Follow-Up: Your physician wants you to follow-up in: December with Dr. Tamala Julian.  You will receive a reminder letter in the mail two months in advance. If you don't receive a letter, please call our office to schedule the follow-up appointment.   Any Other Special Instructions Will Be Listed Below (If Applicable).     If you need a refill on your cardiac medications before your next appointment, please call your pharmacy.

## 2017-11-21 NOTE — Patient Instructions (Signed)
Description   Continue taking 1 tablet daily except 1.5 tablets on Mondays, Wednesdays, and Fridays. Recheck INR in 2 weeks with MD appt. Continue normal dark green consistency.  Coumadin Clinic [phone] Main [phone]

## 2017-11-24 ENCOUNTER — Encounter: Payer: BLUE CROSS/BLUE SHIELD | Admitting: Thoracic Surgery (Cardiothoracic Vascular Surgery)

## 2017-11-26 ENCOUNTER — Encounter: Payer: Self-pay | Admitting: Thoracic Surgery (Cardiothoracic Vascular Surgery)

## 2017-12-04 ENCOUNTER — Encounter: Payer: Self-pay | Admitting: Internal Medicine

## 2017-12-04 ENCOUNTER — Ambulatory Visit (INDEPENDENT_AMBULATORY_CARE_PROVIDER_SITE_OTHER): Payer: BLUE CROSS/BLUE SHIELD

## 2017-12-04 ENCOUNTER — Ambulatory Visit (INDEPENDENT_AMBULATORY_CARE_PROVIDER_SITE_OTHER): Payer: BLUE CROSS/BLUE SHIELD | Admitting: Internal Medicine

## 2017-12-04 VITALS — BP 132/74 | HR 64 | Ht 66.0 in | Wt 195.0 lb

## 2017-12-04 DIAGNOSIS — Z953 Presence of xenogenic heart valve: Secondary | ICD-10-CM

## 2017-12-04 DIAGNOSIS — I480 Paroxysmal atrial fibrillation: Secondary | ICD-10-CM | POA: Diagnosis not present

## 2017-12-04 DIAGNOSIS — I4729 Other ventricular tachycardia: Secondary | ICD-10-CM

## 2017-12-04 DIAGNOSIS — Z952 Presence of prosthetic heart valve: Secondary | ICD-10-CM

## 2017-12-04 DIAGNOSIS — Z95810 Presence of automatic (implantable) cardiac defibrillator: Secondary | ICD-10-CM | POA: Diagnosis not present

## 2017-12-04 DIAGNOSIS — I5022 Chronic systolic (congestive) heart failure: Secondary | ICD-10-CM | POA: Diagnosis not present

## 2017-12-04 DIAGNOSIS — I472 Ventricular tachycardia: Secondary | ICD-10-CM

## 2017-12-04 DIAGNOSIS — Z5181 Encounter for therapeutic drug level monitoring: Secondary | ICD-10-CM | POA: Diagnosis not present

## 2017-12-04 LAB — POCT INR: INR: 2.8 (ref 2.0–3.0)

## 2017-12-04 MED ORDER — WARFARIN SODIUM 5 MG PO TABS: 5.0000 mg | ORAL_TABLET | Freq: Every day | ORAL | 1 refills | Status: DC

## 2017-12-04 NOTE — Patient Instructions (Signed)
Description   Continue taking 1 tablet daily except 1.5 tablets on Mondays, Wednesdays, and Fridays. Recheck INR in 3 weeks. Continue normal dark green consistency.  Coumadin Clinic [phone] Main [phone]

## 2017-12-04 NOTE — Patient Instructions (Addendum)
Medication Instructions:  Your physician recommends that you continue on your current medications as directed. Please refer to the Current Medication list given to you today.  Labwork: None ordered.  Testing/Procedures: None ordered.  Follow-Up: Your physician wants you to follow-up in: one year with Dr. Lovena Le.   You will receive a reminder letter in the mail two months in advance. If you don't receive a letter, please call our office to schedule the follow-up appointment.  Remote monitoring is used to monitor your ICD from home. This monitoring reduces the number of office visits required to check your device to one time per year. It allows [place] to keep an eye on the functioning of your device to ensure it is working properly. You are scheduled for a device check from home on 03/12/2018. You may send your transmission at any time that day. If you have a wireless device, the transmission will be sent automatically. After your physician reviews your transmission, you will receive a postcard with your next transmission date.  Any Other Special Instructions Will Be Listed Below (If Applicable).  If you need a refill on your cardiac medications before your next appointment, please call your pharmacy.

## 2017-12-04 NOTE — Progress Notes (Addendum)
HPI [name] returns today for ICD followup. She is a pleasant 57 yo woman with a h/o chronic systolic heart failure, s/p MV repair who developed mitral stenosis and underwent transcatheter mitral valve replacement a couple of months ago. Her symptoms are much improved. She denies chest pain or sob. Her incisions have healed nicely and she denies chest pain or peripheral edema. No ICD shocks. Allergies  Allergen Reactions  . Prochlorperazine Edisylate Other (See Comments)    Nervous/ flutter/ shakes--compazine  . Adhesive [Tape] Other (See Comments)    Redness/skin peeling Redness/hives from adhesive tape( tolerates latex gloves); tolerates paper tape  . Prochlorperazine Other (See Comments)    Jitters, hyper     Current Outpatient Medications  Medication Sig Dispense Refill  . acetaminophen (TYLENOL) 500 MG tablet Take 2 tablets (1,000 mg total) by mouth every 6 (six) hours. 30 tablet 0  . albuterol (PROVENTIL HFA;VENTOLIN HFA) 108 (90 Base) MCG/ACT inhaler Inhale 2 puffs into the lungs every 4 (four) hours as needed for wheezing or shortness of breath. 1 Inhaler 1  . amoxicillin (AMOXIL) 500 MG tablet Take 4 tablets (2,000 mg total) by mouth as directed. Take one hour prior to dental visits.    Marland Kitchen aspirin EC 81 MG EC tablet Take 1 tablet (81 mg total) by mouth daily.    Marland Kitchen atorvastatin (LIPITOR) 80 MG tablet Take 1 tablet (80 mg total) by mouth daily at 6 PM. 30 tablet 1  . b complex vitamins tablet Take 1 tablet by mouth 2 (two) times daily.     . carvedilol (COREG) 3.125 MG tablet Take 1 tablet (3.125 mg total) by mouth 2 (two) times daily with a meal.    . cholecalciferol (VITAMIN D-400) 400 UNITS TABS tablet Take 400 Units by mouth 2 (two) times daily.    . diphenhydrAMINE (BENADRYL) 25 MG tablet Take 25 mg by mouth every 6 (six) hours as needed for itching or allergies.     . Diphenhydramine-APAP (PERCOGESIC EXTRA STRENGTH) 12.5-500 MG TABS Take 2 tablets by mouth 2 (two)  times daily as needed (for pain.). PERCOGESIC    . furosemide (LASIX) 20 MG tablet TAKE 1 TAB BY MOUTH DAILY. MAY TAKE AN EXTRA TAB AS NEEDED FOR LEG SWELLING OR SHORTNESS OF BREATH 135 tablet 2  . LORazepam (ATIVAN) 0.5 MG tablet TAKE 1 TABLET BY MOUTH EVERY 6 HOURS AS NEEDED FOR NAUSEA AND VOMITING 60 tablet 0  . Multiple Vitamin (MULTIVITAMIN WITH MINERALS) TABS tablet Take 1 tablet by mouth daily.    Marland Kitchen spironolactone (ALDACTONE) 25 MG tablet Take 1 tablet (25 mg total) by mouth daily. 90 tablet 3  . Vitamin D, Ergocalciferol, (DRISDOL) 50000 units CAPS capsule TAKE 1 CAPSULE (50,000 UNITS TOTAL) BY MOUTH EVERY 7 (SEVEN) DAYS. 24 capsule 1  . warfarin (COUMADIN) 5 MG tablet Take 1 tablet (5 mg total) by mouth daily at 6 PM. As directed by the coumadin clinic 110 tablet 1  . zolpidem (AMBIEN) 5 MG tablet TAKE 1 TABLET BY MOUTH EVERYDAY AT BEDTIME 30 tablet 2   No current facility-administered medications for this visit.      Past Medical History:  Diagnosis Date  . Arthritis    "everywhere"  . Asthma    as a child  . Automatic implantable cardioverter-defibrillator in situ    DR. Beckie Salts   . Bladder cancer [facility] 2009   "injected medicine to get rid of it"  . Chronic systolic CHF (congestive heart  failure) ([place])   . CKD (chronic kidney disease), stage III ([place])   . Coronary artery disease    a. large RV/inferior MI complicated by pseudoaneurysm s/p aneurysemectomy and CABG 10/2009 with papillary muscle rupture requiring bioprosthetic mitral valve replacement in [facility].  . 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 H/O thymectomy   . High cholesterol   . Hypothyroidism   . ICD (implantable cardiac defibrillator) in place   . Ischemic cardiomyopathy    a.  ICM and VT arrest 10/2009 s/p AICD (revision 01/2012 due to RV lead problem).  . Myocardial infarction ([place]) 10/14/2009  . Neuropathy    FEET AND HANDS - FROM CHEMO -  BUT MUCH IMPROVED  . PAF (paroxysmal atrial fibrillation) ([place])   . Pain    JOINT PAINS AND MUSCLE ACHES ALL OVER.  Marland Kitchen Port-A-Cath in place    RIGHT UPPER CHEST  . Prosthetic valve dysfunction   . S/P mitral valve replacement with bioprosthetic valve 10/21/2009   Edwards Perimount stented bovine pericardial tissue valve, size 29 mm  . S/P valve-in-valve transcatheter mitral valve replacement 09/16/2017   29 mm Edwards Sapien 3 transcatheter heart valve placed via transapical approach  . SVT (supraventricular tachycardia) ([place])   . Tobacco abuse   . Ventricular tachycardia ([place])     ROS:   All systems reviewed and negative except as noted in the HPI.   Past Surgical History:  Procedure Laterality Date  . ABDOMINAL HYSTERECTOMY N/A 07/27/2013   Procedure: TUMOR EXCISION OF ABDOMINAL MASS;  Surgeon: Alvino Chapel, MD;  Location: [facility];  Service: Gynecology;  Laterality: N/A;  . APPENDECTOMY  1989  . BILATERAL OOPHORECTOMY  2007  . CARDIAC DEFIBRILLATOR PLACEMENT  02/06/2012   "lead change"  . CARDIAC VALVE REPLACEMENT    . CYSTOSCOPY Right 02/23/2013   Procedure: CYSTOSCOPY WITH STENT REMOVAL;  Surgeon: Alvino Chapel, MD;  Location: [facility];  Service: Gynecology;  Laterality: Right;  . IMPLANTABLE CARDIOVERTER DEFIBRILLATOR REVISION N/A 02/06/2012   Procedure: IMPLANTABLE CARDIOVERTER DEFIBRILLATOR REVISION;  Surgeon: Evans Lance, MD;  Location: [facility] CATH LAB;  Service: Cardiovascular;  Laterality: N/A;  . INSERT / REPLACE / REMOVE PACEMAKER  10/2009   DUAL-CHAMBER St. JUDE DEFIBRILLATOR  . LAPAROTOMY N/A 01/19/2013   Procedure: TUMOR DEBULKING / BOWEL RESECTION/INSERTION RIGHT UTERERAL DOUBLE J STENT/OMENTECTOMY;  Surgeon: Alvino Chapel, MD;  Location: [facility];  Service: Gynecology;  Laterality: N/A;  . MITRAL VALVE REPLACEMENT  10/21/2009   52mm Edwards Perimount bovine pericardial tissue valve - [facility]  . RIGHT/LEFT HEART CATH AND CORONARY  ANGIOGRAPHY N/A 07/02/2017   Procedure: RIGHT/LEFT HEART CATH AND CORONARY ANGIOGRAPHY;  Surgeon: Belva Crome, MD;  Location: [place] CV LAB;  Service: Cardiovascular;  Laterality: N/A;  . TEE WITHOUT CARDIOVERSION N/A 09/16/2017   Procedure: TRANSESOPHAGEAL ECHOCARDIOGRAM (TEE);  Surgeon: [name], MD;  Location: [place];  Service: Open Heart Surgery;  Laterality: N/A;  . THYMECTOMY  2009  . TONSILLECTOMY AND ADENOIDECTOMY  ~ 1967  . TOTAL HIP ARTHROPLASTY  05/06/2012   Procedure: TOTAL HIP ARTHROPLASTY;  Surgeon: Gearlean Alf, MD;  Location: [facility];  Service: Orthopedics;  Laterality: Right;  . TRANSCATHETER MITRAL VALVE REPLACEMENT, TRANSAPICAL N/A 09/16/2017   Procedure: TRANSCATHETER MITRAL VALVE REPLACEMENT,TRANSAPICAL;  Surgeon: [name], MD;  Location: [place];  Service: Open Heart Surgery;  Laterality: N/A;  . TRANSURETHRAL RESECTION OF BLADDER  2009   "for bladder cancer"  . TUBAL LIGATION  1993  . VAGINAL HYSTERECTOMY  2001     Family History  Problem Relation Age of Onset  . Heart attack Paternal Grandfather   . Heart attack Maternal Grandfather   . Stroke Maternal Grandmother   . Heart attack Brother      Social History   Socioeconomic History  . Marital status: Divorced    Spouse name: Not on file  . Number of children: Not on file  . Years of education: Not on file  . Highest education level: Not on file  Occupational History    Comment: WORKS FULL TIME  Social Needs  . Financial resource strain: Not on file  . Food insecurity:    Worry: Not on file    Inability: Not on file  . Transportation needs:    Medical: Not on file    Non-medical: Not on file  Tobacco Use  . Smoking status: Current Some Day Smoker    Packs/day: 0.50    Years: 30.00    Pack years: 15.00    Types: Cigarettes    Start date: 03/28/1974  . Smokeless tobacco: Never Used  Substance and Sexual Activity  . Alcohol use: No    Alcohol/week: 0.0 oz  . Drug use: No  .  Sexual activity: Not on file  Lifestyle  . Physical activity:    Days per week: Not on file    Minutes per session: Not on file  . Stress: Not on file  Relationships  . Social connections:    Talks on phone: Not on file    Gets together: Not on file    Attends religious service: Not on file    Active member of club or organization: Not on file    Attends meetings of clubs or organizations: Not on file    Relationship status: Not on file  . Intimate partner violence:    Fear of current or ex partner: Not on file    Emotionally abused: Not on file    Physically abused: Not on file    Forced sexual activity: Not on file  Other Topics Concern  . Not on file  Social History Narrative   WORKS FULL TIME   SINGLE   TOBACCO USE-YES   IMPLANTATION OF DUAL-CHAMBER St. JUDE DEFIBRILLATOR     BP 132/74   Pulse 64   Ht 5\' 6"  (1.676 m)   Wt 195 lb (88.5 kg)   BMI 31.47 kg/m   Physical Exam:  Well appearing middle aged woman, NAD HEENT: Unremarkable Neck:  6 cm JVD, no thyromegally Lymphatics:  No adenopathy Back:  No CVA tenderness Lungs:  Clear with no wheezes HEART:  Regular rate rhythm, no murmurs, no rubs, no clicks Abd:  soft, positive bowel sounds, no organomegally, no rebound, no guarding Ext:  2 plus pulses, no edema, no cyanosis, no clubbing Skin:  No rashes no nodules Neuro:  CN II through XII intact, motor grossly intact   DEVICE  Normal device function.  See PaceArt for details.   Assess/Plan: 1. Chronic systolic heart failure - her symptoms are class 2. She feels well. 2. ICD - her St. Jude DDD ICD is working normally. Will follow. 3. VT - she has had a single non-sustained episode. She will continue her current meds. 4. PAF - she has had no significant atrial fib. She will continue her current meds. She will contine warfarin  Mikle Bosworth.D.

## 2017-12-08 LAB — CUP PACEART INCLINIC DEVICE CHECK
Battery Remaining Longevity: 31 mo
Brady Statistic RA Percent Paced: 1.7 %
Date Time Interrogation Session: 20190613154634
HighPow Impedance: 65.25 Ohm
Implantable Lead Implant Date: 20110503
Implantable Pulse Generator Implant Date: 20110503
Lead Channel Impedance Value: 325 Ohm
Lead Channel Pacing Threshold Amplitude: 0.75 V
Lead Channel Pacing Threshold Amplitude: 0.75 V
Lead Channel Pacing Threshold Amplitude: 1.5 V
Lead Channel Pacing Threshold Pulse Width: 0.5 ms
Lead Channel Pacing Threshold Pulse Width: 1.3 ms
Lead Channel Pacing Threshold Pulse Width: 1.3 ms
Lead Channel Sensing Intrinsic Amplitude: 2.8 mV
Lead Channel Setting Pacing Amplitude: 2 V
Lead Channel Setting Pacing Amplitude: 3 V
MDC IDC LEAD IMPLANT DT: 20130815
MDC IDC LEAD LOCATION: 753859
MDC IDC LEAD LOCATION: 753860
MDC IDC MSMT LEADCHNL RA PACING THRESHOLD PULSEWIDTH: 0.5 ms
MDC IDC MSMT LEADCHNL RV IMPEDANCE VALUE: 400 Ohm
MDC IDC MSMT LEADCHNL RV PACING THRESHOLD AMPLITUDE: 1.5 V
MDC IDC MSMT LEADCHNL RV SENSING INTR AMPL: 11.4 mV
MDC IDC PG SERIAL: 778164
MDC IDC SET LEADCHNL RV PACING PULSEWIDTH: 1.3 ms
MDC IDC SET LEADCHNL RV SENSING SENSITIVITY: 0.5 mV
MDC IDC STAT BRADY RV PERCENT PACED: 0.14 %

## 2017-12-10 ENCOUNTER — Other Ambulatory Visit: Payer: Self-pay | Admitting: Hematology & Oncology

## 2017-12-11 ENCOUNTER — Other Ambulatory Visit: Payer: Self-pay | Admitting: *Deleted

## 2017-12-11 MED ORDER — LORAZEPAM 0.5 MG PO TABS: ORAL_TABLET | ORAL | 2 refills | Status: DC

## 2017-12-30 ENCOUNTER — Ambulatory Visit (INDEPENDENT_AMBULATORY_CARE_PROVIDER_SITE_OTHER): Payer: BLUE CROSS/BLUE SHIELD | Admitting: *Deleted

## 2017-12-30 DIAGNOSIS — Z953 Presence of xenogenic heart valve: Secondary | ICD-10-CM | POA: Diagnosis not present

## 2017-12-30 DIAGNOSIS — Z952 Presence of prosthetic heart valve: Secondary | ICD-10-CM

## 2017-12-30 DIAGNOSIS — Z5181 Encounter for therapeutic drug level monitoring: Secondary | ICD-10-CM

## 2017-12-30 DIAGNOSIS — I480 Paroxysmal atrial fibrillation: Secondary | ICD-10-CM

## 2017-12-30 LAB — POCT INR: INR: 2.2 (ref 2.0–3.0)

## 2017-12-30 NOTE — Patient Instructions (Signed)
Description   Continue taking 1 tablet daily except 1.5 tablets on Mondays, Wednesdays, and Fridays. Recheck INR in 4 weeks. Continue normal dark green consistency.  Coumadin Clinic [phone] Main [phone]

## 2018-01-27 ENCOUNTER — Ambulatory Visit (INDEPENDENT_AMBULATORY_CARE_PROVIDER_SITE_OTHER): Payer: BLUE CROSS/BLUE SHIELD

## 2018-01-27 DIAGNOSIS — I480 Paroxysmal atrial fibrillation: Secondary | ICD-10-CM | POA: Diagnosis not present

## 2018-01-27 DIAGNOSIS — Z5181 Encounter for therapeutic drug level monitoring: Secondary | ICD-10-CM

## 2018-01-27 DIAGNOSIS — Z953 Presence of xenogenic heart valve: Secondary | ICD-10-CM

## 2018-01-27 DIAGNOSIS — Z952 Presence of prosthetic heart valve: Secondary | ICD-10-CM | POA: Diagnosis not present

## 2018-01-27 LAB — POCT INR: INR: 2 (ref 2.0–3.0)

## 2018-01-27 NOTE — Patient Instructions (Signed)
Description   Continue taking 1 tablet daily except 1.5 tablets on Mondays, Wednesdays, and Fridays. Recheck INR in 4 weeks. Continue normal dark green consistency.  Coumadin Clinic [phone] Main [phone]

## 2018-02-14 ENCOUNTER — Other Ambulatory Visit: Payer: Self-pay | Admitting: Hematology & Oncology

## 2018-02-16 ENCOUNTER — Other Ambulatory Visit: Payer: Self-pay | Admitting: *Deleted

## 2018-02-24 ENCOUNTER — Ambulatory Visit (INDEPENDENT_AMBULATORY_CARE_PROVIDER_SITE_OTHER): Payer: BLUE CROSS/BLUE SHIELD | Admitting: *Deleted

## 2018-02-24 DIAGNOSIS — Z952 Presence of prosthetic heart valve: Secondary | ICD-10-CM | POA: Diagnosis not present

## 2018-02-24 DIAGNOSIS — Z5181 Encounter for therapeutic drug level monitoring: Secondary | ICD-10-CM | POA: Diagnosis not present

## 2018-02-24 DIAGNOSIS — Z953 Presence of xenogenic heart valve: Secondary | ICD-10-CM

## 2018-02-24 DIAGNOSIS — I480 Paroxysmal atrial fibrillation: Secondary | ICD-10-CM | POA: Diagnosis not present

## 2018-02-24 LAB — POCT INR: INR: 2.3 (ref 2.0–3.0)

## 2018-02-24 NOTE — Patient Instructions (Signed)
Description   Continue taking 1 tablet daily except 1.5 tablets on Mondays, Wednesdays, and Fridays. Recheck INR in 6 weeks. Continue normal dark green consistency.  Coumadin Clinic [phone] Main [phone]

## 2018-03-05 ENCOUNTER — Other Ambulatory Visit: Payer: Self-pay | Admitting: *Deleted

## 2018-03-05 DIAGNOSIS — D3910 Neoplasm of uncertain behavior of unspecified ovary: Secondary | ICD-10-CM

## 2018-03-06 ENCOUNTER — Inpatient Hospital Stay: Payer: BLUE CROSS/BLUE SHIELD

## 2018-03-06 ENCOUNTER — Encounter: Payer: Self-pay | Admitting: Hematology & Oncology

## 2018-03-06 ENCOUNTER — Inpatient Hospital Stay: Payer: BLUE CROSS/BLUE SHIELD | Attending: Hematology & Oncology | Admitting: Hematology & Oncology

## 2018-03-06 ENCOUNTER — Other Ambulatory Visit: Payer: Self-pay

## 2018-03-06 VITALS — BP 108/75 | HR 84 | Temp 98.5°F | Resp 16 | Wt 197.0 lb

## 2018-03-06 DIAGNOSIS — Z952 Presence of prosthetic heart valve: Secondary | ICD-10-CM | POA: Diagnosis not present

## 2018-03-06 DIAGNOSIS — D3910 Neoplasm of uncertain behavior of unspecified ovary: Secondary | ICD-10-CM

## 2018-03-06 DIAGNOSIS — Z79899 Other long term (current) drug therapy: Secondary | ICD-10-CM | POA: Diagnosis not present

## 2018-03-06 DIAGNOSIS — Z7901 Long term (current) use of anticoagulants: Secondary | ICD-10-CM | POA: Insufficient documentation

## 2018-03-06 LAB — CMP (CANCER CENTER ONLY)
ALT: 26 U/L (ref 10–47)
AST: 19 U/L (ref 11–38)
Albumin: 3.7 g/dL (ref 3.5–5.0)
Alkaline Phosphatase: 71 U/L (ref 26–84)
Anion gap: 6 (ref 5–15)
BUN: 10 mg/dL (ref 7–22)
CHLORIDE: 107 mmol/L (ref 98–108)
CO2: 26 mmol/L (ref 18–33)
CREATININE: 1.4 mg/dL — AB (ref 0.60–1.20)
Calcium: 9.2 mg/dL (ref 8.0–10.3)
GLUCOSE: 101 mg/dL (ref 73–118)
POTASSIUM: 3.8 mmol/L (ref 3.3–4.7)
SODIUM: 139 mmol/L (ref 128–145)
Total Bilirubin: 0.5 mg/dL (ref 0.2–1.6)
Total Protein: 7.1 g/dL (ref 6.4–8.1)

## 2018-03-06 LAB — CBC WITH DIFFERENTIAL (CANCER CENTER ONLY)
BASOS PCT: 0 %
Basophils Absolute: 0 10*3/uL (ref 0.0–0.1)
EOS ABS: 0.3 10*3/uL (ref 0.0–0.5)
EOS PCT: 2 %
HCT: 40.8 % (ref 34.8–46.6)
HEMOGLOBIN: 13.6 g/dL (ref 11.6–15.9)
LYMPHS ABS: 2.3 10*3/uL (ref 0.9–3.3)
Lymphocytes Relative: 20 %
MCH: 30.5 pg (ref 26.0–34.0)
MCHC: 33.3 g/dL (ref 32.0–36.0)
MCV: 91.5 fL (ref 81.0–101.0)
MONOS PCT: 7 %
Monocytes Absolute: 0.8 10*3/uL (ref 0.1–0.9)
Neutro Abs: 8.4 10*3/uL — ABNORMAL HIGH (ref 1.5–6.5)
Neutrophils Relative %: 71 %
PLATELETS: 199 10*3/uL (ref 145–400)
RBC: 4.46 MIL/uL (ref 3.70–5.32)
RDW: 14.7 % (ref 11.1–15.7)
WBC Count: 11.8 10*3/uL — ABNORMAL HIGH (ref 3.9–10.0)

## 2018-03-06 NOTE — Progress Notes (Signed)
Hematology and Oncology Follow Up Visit  [name] [id number] September 04, 1960 57 y.o. 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  Is now been 2 and half years since we last saw her.  She has been going to [facility] but had not been there for a long time.  Patient says that she is not taking anything for the granulosa cell tumor.  Apparently, she had a CT scan done back in March.  This was a CT angiogram of the abdomen and pelvis.  This showed the large soft tissue masses that were noted previously.  She had one measuring 5.1 x 4.6 cm.  She had a peritoneal implant in the right gutter measuring 2.1 x 2.9 cm.  She has some pelvic lymph nodes that were enlarging.  She had a mitral valve replacement back in March.  She got through this without any difficulty.  I am absolutely pleased to see how well she is looking.  She is still working.  She is had no nausea or vomiting.  Is been no change in bowel or bladder habits.  She is had no leg swelling.  She is had no bleeding.  She is had no cough or shortness of breath.  We typically have followed her tumor with the Inhibin B marker.  Back in December 2018 it was 426.  She is on Coumadin now for the valve replacement.  She has had no rashes.  Overall, I would say that her performance status is ECOG 1.  Medications:  Current Outpatient Medications:  .  acetaminophen (TYLENOL) 500 MG tablet, Take 2 tablets (1,000 mg total) by mouth every 6 (six) hours., Disp: 30 tablet, Rfl: 0 .  albuterol (PROVENTIL HFA;VENTOLIN HFA) 108 (90 Base) MCG/ACT inhaler, Inhale 2 puffs into the lungs every 4 (four) hours as needed for wheezing or shortness of breath., Disp: 1 Inhaler, Rfl: 1 .  amoxicillin (AMOXIL) 500 MG tablet, Take 4 tablets (2,000 mg total) by mouth as directed. Take one hour prior to dental  visits., Disp: , Rfl:  .  aspirin EC 81 MG EC tablet, Take 1 tablet (81 mg total) by mouth daily., Disp: , Rfl:  .  atorvastatin (LIPITOR) 80 MG tablet, Take 1 tablet (80 mg total) by mouth daily at 6 PM., Disp: 30 tablet, Rfl: 1 .  b complex vitamins tablet, Take 1 tablet by mouth 2 (two) times daily. , Disp: , Rfl:  .  carvedilol (COREG) 3.125 MG tablet, Take 1 tablet (3.125 mg total) by mouth 2 (two) times daily with a meal., Disp: , Rfl:  .  cholecalciferol (VITAMIN D-400) 400 UNITS TABS tablet, Take 400 Units by mouth 2 (two) times daily., Disp: , Rfl:  .  diphenhydrAMINE (BENADRYL) 25 MG tablet, Take 25 mg by mouth every 6 (six) hours as needed for itching or allergies. , Disp: , Rfl:  .  Diphenhydramine-APAP (PERCOGESIC EXTRA STRENGTH) 12.5-500 MG TABS, Take 2 tablets by mouth 2 (two) times daily as needed (for pain.). PERCOGESIC, Disp: , Rfl:  .  furosemide (LASIX) 20 MG tablet, TAKE 1 TAB BY MOUTH DAILY. MAY TAKE AN EXTRA TAB AS NEEDED FOR LEG SWELLING OR SHORTNESS OF BREATH, Disp: 135 tablet, Rfl: 2 .  LORazepam (ATIVAN) 0.5 MG tablet, TAKE 1 TABLET BY MOUTH EVERY 6 HOURS AS NEEDED FOR  NAUSEA AND VOMITING, Disp: 60 tablet, Rfl: 0 .  Multiple Vitamin (MULTIVITAMIN WITH MINERALS) TABS tablet, Take 1 tablet by mouth daily., Disp: , Rfl:  .  spironolactone (ALDACTONE) 25 MG tablet, Take 1 tablet (25 mg total) by mouth daily., Disp: 90 tablet, Rfl: 3 .  Vitamin D, Ergocalciferol, (DRISDOL) 50000 units CAPS capsule, TAKE 1 CAPSULE (50,000 UNITS TOTAL) BY MOUTH EVERY 7 (SEVEN) DAYS., Disp: 24 capsule, Rfl: 1 .  warfarin (COUMADIN) 5 MG tablet, Take 1 tablet (5 mg total) by mouth daily at 6 PM. As directed by the coumadin clinic, Disp: 110 tablet, Rfl: 1 .  zolpidem (AMBIEN) 5 MG tablet, TAKE 1 TABLET BY MOUTH EVERYDAY AT BEDTIME, Disp: 30 tablet, Rfl: 2  Allergies:  Allergies  Allergen Reactions  . Prochlorperazine Edisylate Other (See Comments)    Nervous/ flutter/ shakes--compazine  .  Adhesive [Tape] Other (See Comments)    Redness/skin peeling Redness/hives from adhesive tape( tolerates latex gloves); tolerates paper tape  . Prochlorperazine Other (See Comments)    Jitters, hyper Jitters, hyper    Past Medical History, Surgical history, Social history, and Family History were reviewed and updated.  Review of Systems: Review of Systems  Constitutional: Negative.   HENT:  Negative.   Eyes: Negative.   Respiratory: Negative.   Cardiovascular: Negative.   Gastrointestinal: Negative.   Endocrine: Negative.   Genitourinary: Negative.    Musculoskeletal: Negative.   Skin: Negative.   Neurological: Negative.   Hematological: Negative.   Psychiatric/Behavioral: Negative.     Physical Exam:  weight is 197 lb (89.4 kg). Her oral temperature is 98.5 F (36.9 C). Her blood pressure is 108/75 and her pulse is 84. Her respiration is 16 and oxygen saturation is 100%.   Wt Readings from Last 3 Encounters:  03/06/18 197 lb (89.4 kg)  12/04/17 195 lb (88.5 kg)  11/21/17 196 lb 12.8 oz (89.3 kg)    Physical Exam  Constitutional: She is oriented to person, place, and time.  HENT:  Head: Normocephalic and atraumatic.  Mouth/Throat: Oropharynx is clear and moist.  Eyes: Pupils are equal, round, and reactive to light. EOM are normal.  Neck: Normal range of motion.  Cardiovascular: Normal rate, regular rhythm and normal heart sounds.  Regular rate and rhythm.  She has no murmurs.  Pulmonary/Chest: Effort normal and breath sounds normal.  Abdominal: Soft. Bowel sounds are normal.  Abdominal exam shows a soft abdomen.  She has good bowel sounds.  There is no fluid wave.  There is no palpable liver or spleen tip.  I cannot palpate any obvious abdominal mass.  Musculoskeletal: Normal range of motion. She exhibits no edema, tenderness or deformity.  Lymphadenopathy:    She has no cervical adenopathy.  Neurological: She is alert and oriented to person, place, and time.    Skin: Skin is warm and dry. No rash noted. No erythema.  Psychiatric: She has a normal mood and affect. Her behavior is normal. Judgment and thought content normal.  Vitals reviewed.    Lab Results  Component Value Date   WBC 11.8 (H) 03/06/2018   HGB 13.6 03/06/2018   HCT 40.8 03/06/2018   MCV 91.5 03/06/2018   PLT 199 03/06/2018     Chemistry      Component Value Date/Time   NA 139 03/06/2018 1140   NA 140 07/21/2017 1120   NA 140 03/15/2016 0953   K 3.8 03/06/2018 1140   K 3.9 03/15/2016 0953   CL 107 03/06/2018 1140  CL 102 12/13/2015 0803   CO2 26 03/06/2018 1140   CO2 19 (L) 03/15/2016 0953   BUN 10 03/06/2018 1140   BUN 13 07/21/2017 1120   BUN 15.9 03/15/2016 0953   CREATININE 1.40 (H) 03/06/2018 1140   CREATININE 1.4 (H) 03/15/2016 0953      Component Value Date/Time   CALCIUM 9.2 03/06/2018 1140   CALCIUM 9.3 03/15/2016 0953   ALKPHOS 71 03/06/2018 1140   ALKPHOS 55 03/15/2016 0953   AST 19 03/06/2018 1140   AST 17 03/15/2016 0953   ALT 26 03/06/2018 1140   ALT 23 03/15/2016 0953   BILITOT 0.5 03/06/2018 1140   BILITOT 0.38 03/15/2016 0953      Impression and Plan: Ms. Chaffin is a 57 year old white female.  She has a long history of a granulosa cell tumor of the ovary.  She is had multiple surgeries.  She has been seen down at [facility].  Apparently, she is no longer followed down there.  We definitely need to get another CT scan on her so we can see how everything looks with tumor growth.  Now, I would recommend that we get a biopsy of 1 of these tumors so that we can send it off for molecular profiling.  I think this would be incredibly helpful for our treatment recommendations.  Again she is on Coumadin.  We will have to transition her off the Coumadin for a biopsy.  I will have to talk to cardiology about this.  I think they are managing her Coumadin.  It was really nice to see Ms. Dukes again.  I am just happy that she is done as well as she has  done with this tumor.  I will plan to get her back once we have the CAT scan results and, biopsy results in, and the molecular profiling back.   Volanda [place], MD 9/13/20192:22 PM

## 2018-03-12 ENCOUNTER — Ambulatory Visit (INDEPENDENT_AMBULATORY_CARE_PROVIDER_SITE_OTHER): Payer: BLUE CROSS/BLUE SHIELD | Admitting: *Deleted

## 2018-03-12 DIAGNOSIS — I4729 Other ventricular tachycardia: Secondary | ICD-10-CM

## 2018-03-12 DIAGNOSIS — I5022 Chronic systolic (congestive) heart failure: Secondary | ICD-10-CM

## 2018-03-12 DIAGNOSIS — I472 Ventricular tachycardia: Secondary | ICD-10-CM

## 2018-03-12 LAB — INHIBIN B: INHIBIN B: 1602.8 pg/mL — AB (ref 0.0–16.9)

## 2018-03-12 NOTE — Progress Notes (Signed)
Remote ICD transmission.   

## 2018-03-25 LAB — CUP PACEART REMOTE DEVICE CHECK
Battery Remaining Percentage: 25 %
Battery Voltage: 2.81 V
Brady Statistic AP VP Percent: 0 %
Brady Statistic AS VP Percent: 1 %
Brady Statistic RA Percent Paced: 1 %
HighPow Impedance: 68 Ohm
HighPow Impedance: 68 Ohm
Implantable Lead Implant Date: 20130815
Implantable Lead Location: 753860
Lead Channel Impedance Value: 330 Ohm
Lead Channel Impedance Value: 390 Ohm
Lead Channel Pacing Threshold Amplitude: 1.5 V
Lead Channel Sensing Intrinsic Amplitude: 11.1 mV
Lead Channel Sensing Intrinsic Amplitude: 2.1 mV
Lead Channel Setting Pacing Amplitude: 2 V
Lead Channel Setting Pacing Amplitude: 3 V
Lead Channel Setting Pacing Pulse Width: 1.3 ms
MDC IDC LEAD IMPLANT DT: 20110503
MDC IDC LEAD LOCATION: 753859
MDC IDC MSMT BATTERY REMAINING LONGEVITY: 23 mo
MDC IDC MSMT LEADCHNL RA PACING THRESHOLD AMPLITUDE: 0.75 V
MDC IDC MSMT LEADCHNL RA PACING THRESHOLD PULSEWIDTH: 0.5 ms
MDC IDC MSMT LEADCHNL RV PACING THRESHOLD PULSEWIDTH: 1.3 ms
MDC IDC PG IMPLANT DT: 20110503
MDC IDC PG SERIAL: 778164
MDC IDC SESS DTM: 20190919082318
MDC IDC SET LEADCHNL RV SENSING SENSITIVITY: 0.5 mV
MDC IDC STAT BRADY AP VS PERCENT: 1 %
MDC IDC STAT BRADY AS VS PERCENT: 99 %
MDC IDC STAT BRADY RV PERCENT PACED: 1 %

## 2018-04-07 ENCOUNTER — Telehealth: Payer: Self-pay | Admitting: Cardiovascular Disease

## 2018-04-07 ENCOUNTER — Ambulatory Visit (INDEPENDENT_AMBULATORY_CARE_PROVIDER_SITE_OTHER): Payer: BLUE CROSS/BLUE SHIELD

## 2018-04-07 DIAGNOSIS — Z952 Presence of prosthetic heart valve: Secondary | ICD-10-CM

## 2018-04-07 DIAGNOSIS — I480 Paroxysmal atrial fibrillation: Secondary | ICD-10-CM | POA: Diagnosis not present

## 2018-04-07 DIAGNOSIS — Z5181 Encounter for therapeutic drug level monitoring: Secondary | ICD-10-CM | POA: Diagnosis not present

## 2018-04-07 DIAGNOSIS — Z953 Presence of xenogenic heart valve: Secondary | ICD-10-CM

## 2018-04-07 LAB — POCT INR: INR: 3.1 — AB (ref 2.0–3.0)

## 2018-04-07 NOTE — Telephone Encounter (Signed)
Patient want to discuss coming off coumadin and going on plavix. Please call to advise.

## 2018-04-07 NOTE — Patient Instructions (Signed)
Description   Skip today's dosage of Coumadin, then resume same dosage 1 tablet daily except 1.5 tablets on Mondays, Wednesdays, and Fridays. Recheck INR in 4 weeks. Continue normal dark green consistency.  Coumadin Clinic [phone] Main [phone]

## 2018-04-08 ENCOUNTER — Ambulatory Visit (HOSPITAL_BASED_OUTPATIENT_CLINIC_OR_DEPARTMENT_OTHER)
Admission: RE | Admit: 2018-04-08 | Discharge: 2018-04-08 | Disposition: A | Discharge status: Home / Self Care | Payer: BLUE CROSS/BLUE SHIELD | Source: Ambulatory Visit | Attending: Hematology & Oncology | Admitting: Hematology & Oncology

## 2018-04-08 ENCOUNTER — Encounter (HOSPITAL_BASED_OUTPATIENT_CLINIC_OR_DEPARTMENT_OTHER): Payer: Self-pay

## 2018-04-08 DIAGNOSIS — D3910 Neoplasm of uncertain behavior of unspecified ovary: Secondary | ICD-10-CM

## 2018-04-08 DIAGNOSIS — R918 Other nonspecific abnormal finding of lung field: Secondary | ICD-10-CM | POA: Insufficient documentation

## 2018-04-08 DIAGNOSIS — J439 Emphysema, unspecified: Secondary | ICD-10-CM | POA: Diagnosis not present

## 2018-04-08 DIAGNOSIS — I700 Atherosclerosis of aorta: Secondary | ICD-10-CM | POA: Diagnosis not present

## 2018-04-08 MED ORDER — IOPAMIDOL (ISOVUE-300) INJECTION 61%
100.0000 mL | Freq: Once | INTRAVENOUS | Status: AC | PRN
  Administered 2018-04-08: 80 mL via INTRAVENOUS

## 2018-04-08 NOTE — Telephone Encounter (Signed)
[name] states she spoke with Dr. Burt Knack about potentially coming off Coumadin at her last visit and wants to know if this is still possible.   Per Dr. Thompson Caul 11/21/17 note, "I need to investigate when she actually had atrial fibrillation.  If I am not mistaken it may have been picked up on telemetry of her AICD.  If she is actually ever had atrial fibrillation other than postop, I would be in favor of continuing anticoagulation therapy with Coumadin indefinitely given her mitral valve status, increased gradient across the valve, and heart failure."  Also, "We will plan to see her at a distance perhaps in 6 months.  Her next visit will be with the valve clinic.  Also noted they will see her one year after the procedure and the spring 2020.  I would be in favor of continuing Coumadin therapy.  Will discuss with Dr. Burt Knack."   To Dr. Burt Knack and Dr. Tamala Julian for recommendations.

## 2018-04-08 NOTE — Telephone Encounter (Signed)
Informed patient she will need to continue warfarin to prevent potential complications. She was grateful for assistance.

## 2018-04-08 NOTE — Telephone Encounter (Signed)
D/W Dr Tamala Julian - we agree she needs to remain on warfarin to prevent mitral leaflet thrombus/stroke/other potential thrombotic complications after undergoing valve-in-valve mitral replacement.

## 2018-04-09 NOTE — Telephone Encounter (Signed)
Agree 

## 2018-04-20 ENCOUNTER — Other Ambulatory Visit: Payer: Self-pay | Admitting: Hematology & Oncology

## 2018-04-20 DIAGNOSIS — D3910 Neoplasm of uncertain behavior of unspecified ovary: Secondary | ICD-10-CM

## 2018-04-22 ENCOUNTER — Telehealth: Payer: Self-pay | Admitting: Pharmacist

## 2018-04-22 NOTE — Telephone Encounter (Signed)
-----   Message from Belva Crome, MD sent at 04/21/2018 11:48 AM EDT ----- Regarding: RE: Coumadin Okay to hold coumadin but will need Lovenox bridge thru our coumadin/Anticoagulation clinic. ----- Message ----- From: Roosvelt Maser Sent: 04/21/2018   7:26 AM EDT To: Belva Crome, MD Subject: Coumadin                                        Dr. Tamala Julian,  Dr. Marin Olp has ordered a biopsy for this patient and she will need to hold her bloodthinner 4 days prior to having this procedure.  Can she hold her bloodthinner?  Thank you, [facility] Radiology scheduler

## 2018-04-22 NOTE — Telephone Encounter (Signed)
Discussed with Dr Tamala Julian as we typically do not Lovenox bridge patients for bioprosthetic valve indication - due to patient's large left atrium, low flow state, and valve-in-valve repair, pt is at higher risk for thrombus. Will coordinate Lovenox bridge in clinic.

## 2018-04-24 ENCOUNTER — Other Ambulatory Visit: Payer: Self-pay | Admitting: Pharmacist

## 2018-04-24 ENCOUNTER — Ambulatory Visit (INDEPENDENT_AMBULATORY_CARE_PROVIDER_SITE_OTHER): Payer: BLUE CROSS/BLUE SHIELD | Admitting: Pharmacist

## 2018-04-24 DIAGNOSIS — I480 Paroxysmal atrial fibrillation: Secondary | ICD-10-CM | POA: Diagnosis not present

## 2018-04-24 DIAGNOSIS — Z952 Presence of prosthetic heart valve: Secondary | ICD-10-CM | POA: Diagnosis not present

## 2018-04-24 DIAGNOSIS — Z953 Presence of xenogenic heart valve: Secondary | ICD-10-CM | POA: Diagnosis not present

## 2018-04-24 DIAGNOSIS — Z5181 Encounter for therapeutic drug level monitoring: Secondary | ICD-10-CM | POA: Diagnosis not present

## 2018-04-24 LAB — POCT INR: INR: 2.4 (ref 2.0–3.0)

## 2018-04-24 MED ORDER — ENOXAPARIN SODIUM 80 MG/0.8ML ~~LOC~~ SOLN: 80.0000 mg | Freq: Two times a day (BID) | SUBCUTANEOUS | 1 refills | Status: DC

## 2018-04-24 NOTE — Patient Instructions (Addendum)
Description   See instruction below around procedure Continue same dosage 1 tablet daily except 1.5 tablets on Mondays, Wednesdays, and Fridays. Recheck INR after procedure on lovenox. Continue normal dark green consistency.  Coumadin Clinic [phone] Main [phone]       04/26/18: Last dose of Coumadin.  04/27/18: No Coumadin or Lovenox.  04/28/18: Inject Lovenox 80mg  in the fatty abdominal tissue at least 2 inches from the belly button twice a day about 12 hours apart, 7am and 7pm rotate sites. No Coumadin.  04/29/18: Inject Lovenox in the fatty tissue every 12 hours, 7am and 7pm. No Coumadin.  04/30/18: Inject Lovenox in the fatty tissue in the morning at 7 am (No PM dose). No Coumadin.  05/01/18: Procedure Day - No Lovenox - Resume Coumadin in the evening or as directed by doctor (take an extra half tablet with usual dose for 2 days then resume normal dose).  05/02/18: Resume Lovenox inject in the fatty tissue every 12 hours and take Coumadin.  05/03/18: Inject Lovenox in the fatty tissue every 12 hours and take Coumadin.  05/04/18: Inject Lovenox in the fatty tissue every 12 hours and take Coumadin.  05/05/18: Coumadin appt to check INR.

## 2018-04-29 ENCOUNTER — Telehealth: Payer: Self-pay | Admitting: *Deleted

## 2018-04-29 ENCOUNTER — Other Ambulatory Visit: Payer: Self-pay | Admitting: *Deleted

## 2018-04-29 ENCOUNTER — Other Ambulatory Visit: Payer: Self-pay | Admitting: Radiology

## 2018-04-29 DIAGNOSIS — D3910 Neoplasm of uncertain behavior of unspecified ovary: Secondary | ICD-10-CM

## 2018-04-29 MED ORDER — TRAMADOL HCL 50 MG PO TABS: 50.0000 mg | ORAL_TABLET | Freq: Four times a day (QID) | ORAL | 2 refills | Status: DC | PRN

## 2018-04-29 NOTE — Telephone Encounter (Signed)
Patient c/o consistent pain to her tail bone. She states it hurts all the time, but it becomes severe in nature when she tries to sit. She has tried multiple OTC pain medications without relief. She states she does have some relief when using heat. She states her bowel movements are normal. She has her biopsies scheduled for this Friday.  Dr Marin Olp notified of patient concerns. He states any treatment plan is dependant on results from biopsies. He would like the patient to try Ultram 50mg  q6h prn for pain control until her results come back. Patient is aware of new prescription. Pharmacy confirmed.

## 2018-04-30 ENCOUNTER — Telehealth: Payer: Self-pay | Admitting: *Deleted

## 2018-04-30 ENCOUNTER — Other Ambulatory Visit: Payer: Self-pay | Admitting: *Deleted

## 2018-04-30 ENCOUNTER — Inpatient Hospital Stay: Payer: BLUE CROSS/BLUE SHIELD | Attending: Hematology & Oncology

## 2018-04-30 ENCOUNTER — Other Ambulatory Visit: Payer: Self-pay | Admitting: Radiology

## 2018-04-30 ENCOUNTER — Other Ambulatory Visit: Payer: Self-pay | Admitting: Hematology & Oncology

## 2018-04-30 DIAGNOSIS — F17210 Nicotine dependence, cigarettes, uncomplicated: Secondary | ICD-10-CM | POA: Insufficient documentation

## 2018-04-30 DIAGNOSIS — N390 Urinary tract infection, site not specified: Secondary | ICD-10-CM

## 2018-04-30 DIAGNOSIS — C7989 Secondary malignant neoplasm of other specified sites: Secondary | ICD-10-CM | POA: Diagnosis not present

## 2018-04-30 DIAGNOSIS — C569 Malignant neoplasm of unspecified ovary: Secondary | ICD-10-CM | POA: Diagnosis not present

## 2018-04-30 DIAGNOSIS — Z9221 Personal history of antineoplastic chemotherapy: Secondary | ICD-10-CM | POA: Insufficient documentation

## 2018-04-30 DIAGNOSIS — Z90722 Acquired absence of ovaries, bilateral: Secondary | ICD-10-CM | POA: Diagnosis not present

## 2018-04-30 DIAGNOSIS — Z923 Personal history of irradiation: Secondary | ICD-10-CM | POA: Insufficient documentation

## 2018-04-30 DIAGNOSIS — Z90710 Acquired absence of both cervix and uterus: Secondary | ICD-10-CM | POA: Insufficient documentation

## 2018-04-30 LAB — URINALYSIS, COMPLETE (UACMP) WITH MICROSCOPIC
BILIRUBIN URINE: NEGATIVE
Glucose, UA: NEGATIVE mg/dL
KETONES UR: NEGATIVE mg/dL
Leukocytes, UA: NEGATIVE
Nitrite: NEGATIVE
PH: 6.5 (ref 5.0–8.0)
Protein, ur: NEGATIVE mg/dL

## 2018-04-30 MED ORDER — OXYCODONE-ACETAMINOPHEN 5-325 MG PO TABS: 1.0000 | ORAL_TABLET | ORAL | 0 refills | Status: DC | PRN

## 2018-04-30 NOTE — Telephone Encounter (Signed)
Patient continues to c/o pain to her tail bone. She states the tramadol hasn't decreased the pain at all. She also reports that she is now having difficulty urinating with what she describes as spasms and pain after initiating her stream.   Reviewed symptoms with Dr Marin Olp and he would like patient to come in to provide a urine specimen.   Patient is aware of request. Appointment made.

## 2018-05-01 ENCOUNTER — Other Ambulatory Visit: Payer: Self-pay

## 2018-05-01 ENCOUNTER — Observation Stay (HOSPITAL_COMMUNITY)
Admission: EM | Admit: 2018-05-01 | Discharge: 2018-05-02 | Disposition: A | Discharge status: Home / Self Care | Payer: BLUE CROSS/BLUE SHIELD | Attending: Internal Medicine | Admitting: Internal Medicine

## 2018-05-01 ENCOUNTER — Ambulatory Visit (HOSPITAL_COMMUNITY)
Admission: RE | Admit: 2018-05-01 | Discharge: 2018-05-01 | Disposition: A | Discharge status: Home / Self Care | Payer: BLUE CROSS/BLUE SHIELD | Source: Ambulatory Visit | Attending: Hematology & Oncology | Admitting: Hematology & Oncology

## 2018-05-01 ENCOUNTER — Encounter (HOSPITAL_COMMUNITY): Payer: Self-pay

## 2018-05-01 ENCOUNTER — Emergency Department (HOSPITAL_COMMUNITY): Payer: BLUE CROSS/BLUE SHIELD

## 2018-05-01 DIAGNOSIS — E7800 Pure hypercholesterolemia, unspecified: Secondary | ICD-10-CM

## 2018-05-01 DIAGNOSIS — Z7982 Long term (current) use of aspirin: Secondary | ICD-10-CM | POA: Insufficient documentation

## 2018-05-01 DIAGNOSIS — F17210 Nicotine dependence, cigarettes, uncomplicated: Secondary | ICD-10-CM | POA: Insufficient documentation

## 2018-05-01 DIAGNOSIS — Z951 Presence of aortocoronary bypass graft: Secondary | ICD-10-CM | POA: Insufficient documentation

## 2018-05-01 DIAGNOSIS — I480 Paroxysmal atrial fibrillation: Secondary | ICD-10-CM

## 2018-05-01 DIAGNOSIS — D72829 Elevated white blood cell count, unspecified: Secondary | ICD-10-CM | POA: Diagnosis not present

## 2018-05-01 DIAGNOSIS — T451X5A Adverse effect of antineoplastic and immunosuppressive drugs, initial encounter: Secondary | ICD-10-CM | POA: Insufficient documentation

## 2018-05-01 DIAGNOSIS — Z953 Presence of xenogenic heart valve: Secondary | ICD-10-CM | POA: Insufficient documentation

## 2018-05-01 DIAGNOSIS — I5022 Chronic systolic (congestive) heart failure: Secondary | ICD-10-CM | POA: Diagnosis present

## 2018-05-01 DIAGNOSIS — Z90710 Acquired absence of both cervix and uterus: Secondary | ICD-10-CM | POA: Insufficient documentation

## 2018-05-01 DIAGNOSIS — I2510 Atherosclerotic heart disease of native coronary artery without angina pectoris: Secondary | ICD-10-CM

## 2018-05-01 DIAGNOSIS — E876 Hypokalemia: Secondary | ICD-10-CM | POA: Diagnosis not present

## 2018-05-01 DIAGNOSIS — E039 Hypothyroidism, unspecified: Secondary | ICD-10-CM | POA: Insufficient documentation

## 2018-05-01 DIAGNOSIS — Z79899 Other long term (current) drug therapy: Secondary | ICD-10-CM | POA: Insufficient documentation

## 2018-05-01 DIAGNOSIS — Z7901 Long term (current) use of anticoagulants: Secondary | ICD-10-CM | POA: Insufficient documentation

## 2018-05-01 DIAGNOSIS — D3910 Neoplasm of uncertain behavior of unspecified ovary: Secondary | ICD-10-CM | POA: Insufficient documentation

## 2018-05-01 DIAGNOSIS — G622 Polyneuropathy due to other toxic agents: Secondary | ICD-10-CM | POA: Insufficient documentation

## 2018-05-01 DIAGNOSIS — J410 Simple chronic bronchitis: Secondary | ICD-10-CM | POA: Diagnosis not present

## 2018-05-01 DIAGNOSIS — Z95810 Presence of automatic (implantable) cardiac defibrillator: Secondary | ICD-10-CM | POA: Insufficient documentation

## 2018-05-01 DIAGNOSIS — Z888 Allergy status to other drugs, medicaments and biological substances status: Secondary | ICD-10-CM | POA: Insufficient documentation

## 2018-05-01 DIAGNOSIS — J449 Chronic obstructive pulmonary disease, unspecified: Secondary | ICD-10-CM | POA: Diagnosis present

## 2018-05-01 DIAGNOSIS — R978 Other abnormal tumor markers: Secondary | ICD-10-CM

## 2018-05-01 DIAGNOSIS — I252 Old myocardial infarction: Secondary | ICD-10-CM | POA: Diagnosis not present

## 2018-05-01 DIAGNOSIS — N183 Chronic kidney disease, stage 3 unspecified: Secondary | ICD-10-CM | POA: Diagnosis present

## 2018-05-01 DIAGNOSIS — Z8551 Personal history of malignant neoplasm of bladder: Secondary | ICD-10-CM | POA: Diagnosis not present

## 2018-05-01 DIAGNOSIS — Z96641 Presence of right artificial hip joint: Secondary | ICD-10-CM | POA: Insufficient documentation

## 2018-05-01 DIAGNOSIS — I255 Ischemic cardiomyopathy: Secondary | ICD-10-CM | POA: Diagnosis not present

## 2018-05-01 DIAGNOSIS — R52 Pain, unspecified: Secondary | ICD-10-CM | POA: Diagnosis not present

## 2018-05-01 DIAGNOSIS — Z90722 Acquired absence of ovaries, bilateral: Secondary | ICD-10-CM | POA: Insufficient documentation

## 2018-05-01 DIAGNOSIS — M545 Low back pain: Secondary | ICD-10-CM | POA: Insufficient documentation

## 2018-05-01 DIAGNOSIS — C569 Malignant neoplasm of unspecified ovary: Secondary | ICD-10-CM | POA: Diagnosis not present

## 2018-05-01 DIAGNOSIS — G629 Polyneuropathy, unspecified: Secondary | ICD-10-CM | POA: Insufficient documentation

## 2018-05-01 DIAGNOSIS — G893 Neoplasm related pain (acute) (chronic): Secondary | ICD-10-CM | POA: Diagnosis present

## 2018-05-01 DIAGNOSIS — Z8249 Family history of ischemic heart disease and other diseases of the circulatory system: Secondary | ICD-10-CM

## 2018-05-01 LAB — I-STAT CHEM 8, ED
BUN: 12 mg/dL (ref 6–20)
Calcium, Ion: 1.05 mmol/L — ABNORMAL LOW (ref 1.15–1.40)
Chloride: 102 mmol/L (ref 98–111)
Creatinine, Ser: 1.1 mg/dL — ABNORMAL HIGH (ref 0.44–1.00)
Glucose, Bld: 105 mg/dL — ABNORMAL HIGH (ref 70–99)
HEMATOCRIT: 34 % — AB (ref 36.0–46.0)
HEMOGLOBIN: 11.6 g/dL — AB (ref 12.0–15.0)
Potassium: 3.1 mmol/L — ABNORMAL LOW (ref 3.5–5.1)
Sodium: 137 mmol/L (ref 135–145)
TCO2: 26 mmol/L (ref 22–32)

## 2018-05-01 LAB — URINE CULTURE: Culture: <10000 — AB

## 2018-05-01 LAB — URINALYSIS, ROUTINE W REFLEX MICROSCOPIC
Bilirubin Urine: NEGATIVE
Glucose, UA: NEGATIVE mg/dL
Hgb urine dipstick: NEGATIVE
KETONES UR: NEGATIVE mg/dL
Leukocytes, UA: NEGATIVE
NITRITE: NEGATIVE
PH: 6 (ref 5.0–8.0)
PROTEIN: NEGATIVE mg/dL
Specific Gravity, Urine: 1.028 (ref 1.005–1.030)

## 2018-05-01 LAB — PROTIME-INR
INR: 1.28
PROTHROMBIN TIME: 15.9 s — AB (ref 11.4–15.2)

## 2018-05-01 LAB — CBC
HEMATOCRIT: 38.7 % (ref 36.0–46.0)
HEMOGLOBIN: 12.4 g/dL (ref 12.0–15.0)
MCH: 29.4 pg (ref 26.0–34.0)
MCHC: 32 g/dL (ref 30.0–36.0)
MCV: 91.7 fL (ref 80.0–100.0)
Platelets: 145 10*3/uL — ABNORMAL LOW (ref 150–400)
RBC: 4.22 MIL/uL (ref 3.87–5.11)
RDW: 15.1 % (ref 11.5–15.5)
WBC: 18.8 10*3/uL — ABNORMAL HIGH (ref 4.0–10.5)
nRBC: 0 % (ref 0.0–0.2)

## 2018-05-01 MED ORDER — POTASSIUM CHLORIDE CRYS ER 20 MEQ PO TBCR
40.0000 meq | EXTENDED_RELEASE_TABLET | Freq: Once | ORAL | Status: AC
  Administered 2018-05-01: 40 meq via ORAL
  Filled 2018-05-01: qty 2

## 2018-05-01 MED ORDER — SENNOSIDES-DOCUSATE SODIUM 8.6-50 MG PO TABS
1.0000 | ORAL_TABLET | Freq: Every evening | ORAL | Status: DC | PRN
  Administered 2018-05-02: 1 via ORAL
  Filled 2018-05-01: qty 1

## 2018-05-01 MED ORDER — ONDANSETRON HCL 4 MG PO TABS: 4.0000 mg | ORAL_TABLET | Freq: Four times a day (QID) | ORAL | Status: DC | PRN

## 2018-05-01 MED ORDER — SODIUM CHLORIDE 0.9 % IV BOLUS
1000.0000 mL | Freq: Once | INTRAVENOUS | Status: AC
  Administered 2018-05-01: 1000 mL via INTRAVENOUS

## 2018-05-01 MED ORDER — ENOXAPARIN SODIUM 80 MG/0.8ML ~~LOC~~ SOLN
80.0000 mg | Freq: Two times a day (BID) | SUBCUTANEOUS | Status: DC
  Administered 2018-05-02: 80 mg via SUBCUTANEOUS
  Filled 2018-05-01: qty 0.8

## 2018-05-01 MED ORDER — CHOLECALCIFEROL 10 MCG (400 UNIT) PO TABS
400.0000 [IU] | ORAL_TABLET | Freq: Two times a day (BID) | ORAL | Status: DC
  Administered 2018-05-02: 400 [IU] via ORAL
  Filled 2018-05-01: qty 1

## 2018-05-01 MED ORDER — KETOROLAC TROMETHAMINE 15 MG/ML IJ SOLN
15.0000 mg | Freq: Once | INTRAMUSCULAR | Status: AC
  Administered 2018-05-01: 15 mg via INTRAVENOUS
  Filled 2018-05-01: qty 1

## 2018-05-01 MED ORDER — FENTANYL CITRATE (PF) 100 MCG/2ML IJ SOLN
INTRAMUSCULAR | Status: AC
  Filled 2018-05-01: qty 4

## 2018-05-01 MED ORDER — ZOLPIDEM TARTRATE 5 MG PO TABS: 5.0000 mg | ORAL_TABLET | Freq: Every evening | ORAL | Status: DC | PRN

## 2018-05-01 MED ORDER — CARVEDILOL 3.125 MG PO TABS
3.1250 mg | ORAL_TABLET | Freq: Two times a day (BID) | ORAL | Status: DC
  Administered 2018-05-02: 3.125 mg via ORAL
  Filled 2018-05-01: qty 1

## 2018-05-01 MED ORDER — SODIUM CHLORIDE 0.9% FLUSH
3.0000 mL | Freq: Two times a day (BID) | INTRAVENOUS | Status: DC
  Administered 2018-05-01 – 2018-05-02 (×2): 3 mL via INTRAVENOUS

## 2018-05-01 MED ORDER — B COMPLEX-C PO TABS
1.0000 | ORAL_TABLET | Freq: Two times a day (BID) | ORAL | Status: DC
  Administered 2018-05-01 – 2018-05-02 (×2): 1 via ORAL
  Filled 2018-05-01 (×2): qty 1

## 2018-05-01 MED ORDER — B COMPLEX PO TABS: 1.0000 | ORAL_TABLET | Freq: Two times a day (BID) | ORAL | Status: DC

## 2018-05-01 MED ORDER — HYDROMORPHONE HCL 2 MG PO TABS
4.0000 mg | ORAL_TABLET | Freq: Once | ORAL | Status: AC
  Administered 2018-05-01: 4 mg via ORAL
  Filled 2018-05-01: qty 2

## 2018-05-01 MED ORDER — ASPIRIN EC 81 MG PO TBEC
81.0000 mg | DELAYED_RELEASE_TABLET | Freq: Every day | ORAL | Status: DC
  Administered 2018-05-02: 81 mg via ORAL
  Filled 2018-05-01: qty 1

## 2018-05-01 MED ORDER — HYDROCODONE-ACETAMINOPHEN 5-325 MG PO TABS
ORAL_TABLET | ORAL | Status: AC
  Administered 2018-05-01: 2 via ORAL
  Filled 2018-05-01: qty 2

## 2018-05-01 MED ORDER — SODIUM CHLORIDE 0.9% FLUSH: 3.0000 mL | INTRAVENOUS | Status: DC | PRN

## 2018-05-01 MED ORDER — MORPHINE SULFATE (PF) 4 MG/ML IV SOLN
4.0000 mg | Freq: Once | INTRAVENOUS | Status: AC
  Administered 2018-05-01: 4 mg via INTRAVENOUS
  Filled 2018-05-01: qty 1

## 2018-05-01 MED ORDER — ATORVASTATIN CALCIUM 20 MG PO TABS: 80.0000 mg | ORAL_TABLET | Freq: Every day | ORAL | Status: DC

## 2018-05-01 MED ORDER — SPIRONOLACTONE 25 MG PO TABS
25.0000 mg | ORAL_TABLET | Freq: Every day | ORAL | Status: DC
  Administered 2018-05-02: 25 mg via ORAL
  Filled 2018-05-01: qty 1

## 2018-05-01 MED ORDER — WARFARIN SODIUM 10 MG PO TABS
10.0000 mg | ORAL_TABLET | Freq: Once | ORAL | Status: AC
  Administered 2018-05-01: 10 mg via ORAL
  Filled 2018-05-01: qty 1

## 2018-05-01 MED ORDER — HYDROMORPHONE HCL 1 MG/ML IJ SOLN
1.0000 mg | Freq: Once | INTRAMUSCULAR | Status: AC
  Administered 2018-05-01: 1 mg via INTRAVENOUS
  Filled 2018-05-01: qty 1

## 2018-05-01 MED ORDER — MIDAZOLAM HCL 2 MG/2ML IJ SOLN
INTRAMUSCULAR | Status: AC
  Filled 2018-05-01: qty 4

## 2018-05-01 MED ORDER — DEXAMETHASONE SODIUM PHOSPHATE 10 MG/ML IJ SOLN
10.0000 mg | Freq: Once | INTRAMUSCULAR | Status: AC
  Administered 2018-05-01: 10 mg via INTRAVENOUS
  Filled 2018-05-01: qty 1

## 2018-05-01 MED ORDER — WARFARIN - PHARMACIST DOSING INPATIENT: Freq: Every day | Status: DC

## 2018-05-01 MED ORDER — HYDROMORPHONE HCL 1 MG/ML IJ SOLN: 1.0000 mg | INTRAMUSCULAR | Status: DC | PRN

## 2018-05-01 MED ORDER — SODIUM CHLORIDE 0.9 % IV SOLN: 250.0000 mL | INTRAVENOUS | Status: DC | PRN

## 2018-05-01 MED ORDER — LIDOCAINE HCL 1 % IJ SOLN
INTRAMUSCULAR | Status: AC
  Filled 2018-05-01: qty 20

## 2018-05-01 MED ORDER — DIPHENHYDRAMINE HCL 25 MG PO CAPS: 25.0000 mg | ORAL_CAPSULE | Freq: Four times a day (QID) | ORAL | Status: DC | PRN

## 2018-05-01 MED ORDER — HYDROCODONE-ACETAMINOPHEN 5-325 MG PO TABS
1.0000 | ORAL_TABLET | ORAL | Status: DC | PRN
  Administered 2018-05-01: 2 via ORAL
  Filled 2018-05-01: qty 2

## 2018-05-01 MED ORDER — ALBUTEROL SULFATE (2.5 MG/3ML) 0.083% IN NEBU: 2.5000 mg | INHALATION_SOLUTION | RESPIRATORY_TRACT | Status: DC | PRN

## 2018-05-01 MED ORDER — FENTANYL CITRATE (PF) 100 MCG/2ML IJ SOLN
INTRAMUSCULAR | Status: AC | PRN
  Administered 2018-05-01: 50 ug via INTRAVENOUS

## 2018-05-01 MED ORDER — ONDANSETRON HCL 4 MG/2ML IJ SOLN: 4.0000 mg | Freq: Four times a day (QID) | INTRAMUSCULAR | Status: DC | PRN

## 2018-05-01 MED ORDER — ACETAMINOPHEN 500 MG PO TABS
1000.0000 mg | ORAL_TABLET | Freq: Once | ORAL | Status: AC
  Administered 2018-05-01: 1000 mg via ORAL
  Filled 2018-05-01: qty 2

## 2018-05-01 MED ORDER — ACETAMINOPHEN 650 MG RE SUPP: 650.0000 mg | Freq: Four times a day (QID) | RECTAL | Status: DC | PRN

## 2018-05-01 MED ORDER — SODIUM CHLORIDE 0.9 % IV SOLN: INTRAVENOUS | Status: DC

## 2018-05-01 MED ORDER — IOHEXOL 300 MG/ML  SOLN
100.0000 mL | Freq: Once | INTRAMUSCULAR | Status: AC | PRN
  Administered 2018-05-01: 100 mL via INTRAVENOUS

## 2018-05-01 MED ORDER — OXYCODONE-ACETAMINOPHEN 5-325 MG PO TABS
1.0000 | ORAL_TABLET | ORAL | Status: DC | PRN
  Administered 2018-05-02 (×2): 2 via ORAL
  Filled 2018-05-01 (×2): qty 2

## 2018-05-01 MED ORDER — ACETAMINOPHEN 325 MG PO TABS: 650.0000 mg | ORAL_TABLET | Freq: Four times a day (QID) | ORAL | Status: DC | PRN

## 2018-05-01 MED ORDER — MIDAZOLAM HCL 2 MG/2ML IJ SOLN
INTRAMUSCULAR | Status: AC | PRN
  Administered 2018-05-01: 1 mg via INTRAVENOUS

## 2018-05-01 MED ORDER — FUROSEMIDE 20 MG PO TABS
20.0000 mg | ORAL_TABLET | Freq: Every day | ORAL | Status: DC
  Administered 2018-05-02: 20 mg via ORAL
  Filled 2018-05-01: qty 1

## 2018-05-01 NOTE — Progress Notes (Signed)
ANTICOAGULATION CONSULT NOTE - Initial Consult  Pharmacy Consult for Lovenox/warfarin Indication: atrial fibrillation and MVR  Patient Measurements: Height: 5\' 6"  (167.6 cm) Weight: 197 lb (89.4 kg) IBW/kg (Calculated) : 59.3   Vital Signs: Temp: 98.8 F (37.1 C) (11/08 2229) Temp Source: Oral (11/08 2229) BP: 93/53 (11/08 2229) Pulse Rate: 80 (11/08 2229)  Labs: Recent Labs    05/01/18 0925 05/01/18 1028 05/01/18 1501  HGB 12.4  --  11.6*  HCT 38.7  --  34.0*  PLT 145*  --   --   LABPROT  --  15.9*  --   INR  --  1.28  --   CREATININE  --   --  1.10*    Estimated Creatinine Clearance: 62.7 mL/min (A) (by C-G formula based on SCr of 1.1 mg/dL (H)).   Medical History: Past Medical History:  Diagnosis Date  . Arthritis    "everywhere"  . Asthma    as a child  . Automatic implantable cardioverter-defibrillator in situ    DR. Beckie Salts   . Bladder cancer [facility]) 2009   "injected medicine to get rid of it"  . Chronic systolic CHF (congestive heart failure) ([place])   . CKD (chronic kidney disease), stage III ([place])   . Coronary artery disease    a. large RV/inferior MI complicated by pseudoaneurysm s/p aneurysemectomy and CABG 10/2009 with papillary muscle rupture requiring bioprosthetic mitral valve replacement in [facility].  . 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 H/O thymectomy   . High cholesterol   . Hypothyroidism   . ICD (implantable cardiac defibrillator) in place   . Ischemic cardiomyopathy    a.  ICM and VT arrest 10/2009 s/p AICD (revision 01/2012 due to RV lead problem).  . Myocardial infarction ([place]) 10/14/2009  . Neuropathy    FEET AND HANDS - FROM CHEMO - BUT MUCH IMPROVED  . PAF (paroxysmal atrial fibrillation) ([place])   . Pain    JOINT PAINS AND MUSCLE ACHES ALL OVER.  Marland Kitchen Port-A-Cath in place    RIGHT UPPER CHEST  . Prosthetic valve dysfunction   . S/P mitral valve replacement with  bioprosthetic valve 10/21/2009   Edwards Perimount stented bovine pericardial tissue valve, size 29 mm  . S/P valve-in-valve transcatheter mitral valve replacement 09/16/2017   29 mm Edwards Sapien 3 transcatheter heart valve placed via transapical approach  . SVT (supraventricular tachycardia) ([place])   . Tobacco abuse   . Ventricular tachycardia [facility])     Assessment: 57 yo female with granulosa cell tumor. She is on warfarin prior to admission, she follows closely with our CVRR clinic. She had a biopsy today; therefore warfarin was held a few days ago and patient was bridged with Lovenox. She is being admitted for severe pain in her coccyx region. I will continue her lovenox/warfarin bridge as planned by outpatient anticoagulation clinic. Since her biopsy was this afternoon, I will begin her Lovenox tomorrow and will resume warfarin tonight. Her current weight is 89.4 kg however her dry weight  + outpatient weight appears closer to 80 kg.  PTA warfarin regimen: 7.5 q Mon/Wed/Fri, 5 mg on all other days   Goal of Therapy:  INR 2-3 Monitor platelets by anticoagulation protocol: Yes   Plan:  -Lovenox 80 mg [place] q12h -Warfarin 10 mg po x1  Rikia Sukhu, Jake Church 05/01/2018,10:34 PM

## 2018-05-01 NOTE — ED Provider Notes (Signed)
57 year old female with history of granulosa cell tumor, end-stage, currently followed by Dr. Marin Olp, here with worsening tailbone pain.  She underwent biopsy today.  Plan to obtain CT based on discussion with Dr. Marin Olp.  Unfortunately, CT scan shows significant enlargement of her primary tumor.  There is some mild inflammation consistent with inflammation of the mass versus postbiopsy inflammation.  No apparent bony involvement.  There does appears to be some extension into the colon.  Despite multiple doses of analgesics, the patient has persistent, severe pain.  Discussed management options and patient is requesting admission for pain control.  Patient given a dose of Decadron for possible inflammatory component, Tylenol, Toradol, multiple doses of Dilaudid.   Duffy Bruce, MD 05/01/18 2053

## 2018-05-01 NOTE — ED Triage Notes (Signed)
Pt has known cancer, reports that it sometimes places pressure on her tailbone, c/o pain. Pt had biopsy done today on the area, pain has ben worsening over the past 4-5 days. Has used ultram and percocet at home with heat and no relief.

## 2018-05-01 NOTE — ED Notes (Signed)
Patient transported to CT 

## 2018-05-01 NOTE — H&P (Signed)
Chief Complaint: Patient was seen in consultation today for pre sacral mass biopsy at the request of Ennever,Peter R  Referring Physician(s): Ennever,Peter R  Supervising Physician: Jacqulynn Cadet  Patient Status: [facility] - Out-pt  History of Present Illness: [name] is a 57 y.o. female   Hx Granulosa cell tumor- ovarian cancer Follows with Gaspar Cola and Dr Marin Olp Last visit until recently - was maybe over 2 yrs ago She had MV replacement in March 2019--- doing well LD coumadin Sun 11/3 LD Lovenox 11/7 7 am  Pt is complaining of severe low back pain Seen with Dr Marin Olp Tumor marker elevation  CT 04/08/18:  IMPRESSION: 1. Granulosa cell tumor, by report on observation without current therapy. Somewhat confusing findings on today's exam given this history. The right paracolic gutter mass and a right adnexal mass have clearly decreased in size in the interval. Other masses appear relatively similar, but presacral disease and retroperitoneal disease at the aortic bifurcation have clearly progressed. Disease today represents a combination of mainly cystic, mainly solid, and combined appearance. 2. Interval development of subpleural plaque-like airspace opacity with volume loss in the left lower lobe, likely evolving scar/rounded atelectasis. Tiny left pleural effusion/thickening associated. Attention on follow-up recommended. 3. Bilateral pulmonary nodules, largely stable since prior exam. 4 mm ground-glass nodule in the left lung apex is new in the interval. Attention on follow-up exam suggested. 4.  Aortic Atherosclerois (ICD10-170.0) 5.  Emphysema. [phone]  Scheduled now for pre sacral lesion biopsy  Past Medical History:  Diagnosis Date  . Arthritis    "everywhere"  . Asthma    as a child  . Automatic implantable cardioverter-defibrillator in situ    DR. Beckie Salts   . Bladder cancer [facility]) 2009   "injected medicine to get rid of it"  .  Chronic systolic CHF (congestive heart failure) ([place])   . CKD (chronic kidney disease), stage III ([place])   . Coronary artery disease    a. large RV/inferior MI complicated by pseudoaneurysm s/p aneurysemectomy and CABG 10/2009 with papillary muscle rupture requiring bioprosthetic mitral valve replacement in [facility].  . Granulosa cell carcinoma of ovary ([place])    recurrent - surgical resection 2007, 2014 and 2016   . H/O thymectomy   . High cholesterol   . Hypothyroidism   . ICD (implantable cardiac defibrillator) in place   . Ischemic cardiomyopathy    a.  ICM and VT arrest 10/2009 s/p AICD (revision 01/2012 due to RV lead problem).  . Myocardial infarction ([place]) 10/14/2009  . Neuropathy    FEET AND HANDS - FROM CHEMO - BUT MUCH IMPROVED  . PAF (paroxysmal atrial fibrillation) ([name])   . Pain    JOINT PAINS AND MUSCLE ACHES ALL OVER.  Marland Kitchen Port-A-Cath in place    RIGHT UPPER CHEST  . Prosthetic valve dysfunction   . S/P mitral valve replacement with bioprosthetic valve 10/21/2009   Edwards Perimount stented bovine pericardial tissue valve, size 29 mm  . S/P valve-in-valve transcatheter mitral valve replacement 09/16/2017   29 mm Edwards Sapien 3 transcatheter heart valve placed via transapical approach  . SVT (supraventricular tachycardia) ([place])   . Tobacco abuse   . Ventricular tachycardia [facility])     Past Surgical History:  Procedure Laterality Date  . ABDOMINAL HYSTERECTOMY N/A 07/27/2013   Procedure: TUMOR EXCISION OF ABDOMINAL MASS;  Surgeon: Alvino Chapel, MD;  Location: [facility] ORS;  Service: Gynecology;  Laterality: N/A;  . APPENDECTOMY  1989  . BILATERAL OOPHORECTOMY  2007  . CARDIAC DEFIBRILLATOR PLACEMENT  02/06/2012   "lead change"  . CARDIAC VALVE REPLACEMENT    . CYSTOSCOPY Right 02/23/2013   Procedure: CYSTOSCOPY WITH STENT REMOVAL;  Surgeon: Alvino Chapel, MD;  Location: [facility];  Service: Gynecology;  Laterality: Right;  . IMPLANTABLE CARDIOVERTER  DEFIBRILLATOR REVISION N/A 02/06/2012   Procedure: IMPLANTABLE CARDIOVERTER DEFIBRILLATOR REVISION;  Surgeon: Evans Lance, MD;  Location: [facility] CATH LAB;  Service: Cardiovascular;  Laterality: N/A;  . INSERT / REPLACE / REMOVE PACEMAKER  10/2009   DUAL-CHAMBER St. JUDE DEFIBRILLATOR  . LAPAROTOMY N/A 01/19/2013   Procedure: TUMOR DEBULKING / BOWEL RESECTION/INSERTION RIGHT UTERERAL DOUBLE J STENT/OMENTECTOMY;  Surgeon: Alvino Chapel, MD;  Location: [facility];  Service: Gynecology;  Laterality: N/A;  . MITRAL VALVE REPLACEMENT  10/21/2009   35mm Edwards Perimount bovine pericardial tissue valve - [facility]  . RIGHT/LEFT HEART CATH AND CORONARY ANGIOGRAPHY N/A 07/02/2017   Procedure: RIGHT/LEFT HEART CATH AND CORONARY ANGIOGRAPHY;  Surgeon: Belva Crome, MD;  Location: [place] CV LAB;  Service: Cardiovascular;  Laterality: N/A;  . TEE WITHOUT CARDIOVERSION N/A 09/16/2017   Procedure: TRANSESOPHAGEAL ECHOCARDIOGRAM (TEE);  Surgeon: [name], MD;  Location: [place];  Service: Open Heart Surgery;  Laterality: N/A;  . THYMECTOMY  2009  . TONSILLECTOMY AND ADENOIDECTOMY  ~ 1967  . TOTAL HIP ARTHROPLASTY  05/06/2012   Procedure: TOTAL HIP ARTHROPLASTY;  Surgeon: Gearlean Alf, MD;  Location: [facility];  Service: Orthopedics;  Laterality: Right;  . TRANSCATHETER MITRAL VALVE REPLACEMENT, TRANSAPICAL N/A 09/16/2017   Procedure: TRANSCATHETER MITRAL VALVE REPLACEMENT,TRANSAPICAL;  Surgeon: [name], MD;  Location: [place];  Service: Open Heart Surgery;  Laterality: N/A;  . TRANSURETHRAL RESECTION OF BLADDER  2009   "for bladder cancer"  . TUBAL LIGATION  1993  . VAGINAL HYSTERECTOMY  2001    Allergies: Prochlorperazine edisylate; Adhesive [tape]; and Prochlorperazine  Medications: Prior to Admission medications   Medication Sig Start Date End Date Taking? Authorizing Provider  albuterol (PROVENTIL HFA;VENTOLIN HFA) 108 (90 Base) MCG/ACT inhaler Inhale 2 puffs into the lungs  every 4 (four) hours as needed for wheezing or shortness of breath. 06/06/17  Yes Eugenie Filler, MD  amoxicillin (AMOXIL) 500 MG tablet Take 4 tablets (2,000 mg total) by mouth as directed. Take one hour prior to dental visits. 10/31/17  Yes [name], MD  aspirin EC 81 MG EC tablet Take 1 tablet (81 mg total) by mouth daily. 09/19/17  Yes Gold, Wayne E, PA-C  atorvastatin (LIPITOR) 80 MG tablet Take 1 tablet (80 mg total) by mouth daily at 6 PM. 09/19/17  Yes Gold, Wayne E, PA-C  b complex vitamins tablet Take 1 tablet by mouth 2 (two) times daily.    Yes [provider]  carvedilol (COREG) 3.125 MG tablet Take 1 tablet (3.125 mg total) by mouth 2 (two) times daily with a meal. 10/06/17  Yes Lars Pinks M, PA-C  cholecalciferol (VITAMIN D-400) 400 UNITS TABS tablet Take 400 Units by mouth 2 (two) times daily.   Yes [provider]  diphenhydrAMINE (BENADRYL) 25 MG tablet Take 25 mg by mouth every 6 (six) hours as needed for itching or allergies.    Yes [provider]  Diphenhydramine-APAP (PERCOGESIC EXTRA STRENGTH) 12.5-500 MG TABS Take 2 tablets by mouth 2 (two) times daily as needed (for pain.). [name]   Yes [provider]  furosemide (LASIX) 20 MG tablet TAKE 1 TAB BY MOUTH DAILY. MAY TAKE AN  EXTRA TAB AS NEEDED FOR LEG SWELLING OR SHORTNESS OF BREATH Patient taking differently: Take 20 mg by mouth daily.  10/29/17  Yes Evans Lance, MD  LORazepam (ATIVAN) 0.5 MG tablet TAKE 1 TABLET BY MOUTH EVERY 6 HOURS AS NEEDED FOR NAUSEA AND VOMITING Patient taking differently: Take 0.5 mg by mouth every 6 (six) hours as needed (for nausea and vomiting).  12/11/17  Yes Volanda Napoleon, MD  oxyCODONE-acetaminophen (PERCOCET/ROXICET) 5-325 MG tablet Take 1-2 tablets by mouth every 4 (four) hours as needed for severe pain. 04/30/18  Yes Volanda Napoleon, MD  spironolactone (ALDACTONE) 25 MG tablet Take 1 tablet (25 mg total) by mouth daily. 06/11/17  06/06/18 Yes Belva Crome, MD  Vitamin D, Ergocalciferol, (DRISDOL) 50000 units CAPS capsule TAKE 1 CAPSULE (50,000 UNITS TOTAL) BY MOUTH EVERY 7 (SEVEN) DAYS. Patient taking differently: Take 50,000 Units by mouth every Thursday.  10/23/17  Yes Volanda Napoleon, MD  warfarin (COUMADIN) 5 MG tablet Take 1 tablet (5 mg total) by mouth daily at 6 PM. As directed by the coumadin clinic Patient taking differently: Take 5-7.5 mg by mouth See admin instructions. Take 7.5 mg by mouth daily on Monday, Wednesday and Friday. Take 5 mg by mouth daily on all other days. 12/04/17  Yes Belva Crome, MD  zolpidem (AMBIEN) 5 MG tablet TAKE 1 TABLET BY MOUTH EVERYDAY AT BEDTIME Patient taking differently: Take 5 mg by mouth at bedtime.  02/16/18  Yes Volanda Napoleon, MD  acetaminophen (TYLENOL) 500 MG tablet Take 2 tablets (1,000 mg total) by mouth every 6 (six) hours. Patient taking differently: Take 1,000 mg by mouth every 6 (six) hours as needed for moderate pain or headache.  09/19/17   [place] Rm, Patrick Jupiter E, PA-C  enoxaparin (LOVENOX) 80 MG/0.8ML injection Inject 0.8 mLs (80 mg total) into the skin every 12 (twelve) hours. 04/24/18   Belva Crome, MD     Family History  Problem Relation Age of Onset  . Heart attack Paternal Grandfather   . Heart attack Maternal Grandfather   . Stroke Maternal Grandmother   . Heart attack Brother     Social History   Socioeconomic History  . Marital status: Divorced    Spouse name: Not on file  . Number of children: Not on file  . Years of education: Not on file  . Highest education level: Not on file  Occupational History    Comment: WORKS FULL TIME  Social Needs  . Financial resource strain: Not on file  . Food insecurity:    Worry: Not on file    Inability: Not on file  . Transportation needs:    Medical: Not on file    Non-medical: Not on file  Tobacco Use  . Smoking status: Current Some Day Smoker    Packs/day: 0.50    Years: 30.00    Pack years: 15.00     Types: Cigarettes    Start date: 03/28/1974  . Smokeless tobacco: Never Used  Substance and Sexual Activity  . Alcohol use: No    Alcohol/week: 0.0 standard drinks  . Drug use: No  . Sexual activity: Not on file  Lifestyle  . Physical activity:    Days per week: Not on file    Minutes per session: Not on file  . Stress: Not on file  Relationships  . Social connections:    Talks on phone: Not on file    Gets together: Not on file  Attends religious service: Not on file    Active member of club or organization: Not on file    Attends meetings of clubs or organizations: Not on file    Relationship status: Not on file  Other Topics Concern  . Not on file  Social History Narrative   WORKS FULL TIME   SINGLE   TOBACCO USE-YES   IMPLANTATION OF DUAL-CHAMBER St. JUDE DEFIBRILLATOR    Review of Systems: A 12 point ROS discussed and pertinent positives are indicated in the HPI above.  All other systems are negative.  Review of Systems  Constitutional: Positive for activity change and appetite change. Negative for fatigue and fever.  Respiratory: Negative for cough and shortness of breath.   Cardiovascular: Negative for chest pain.  Gastrointestinal: Positive for abdominal pain.  Musculoskeletal: Positive for back pain.  Psychiatric/Behavioral: Negative for behavioral problems and confusion.    Vital Signs: BP 108/64   Pulse 84   Temp 98.4 F (36.9 C)   Resp 20   Ht 5\' 6"  (1.676 m)   Wt 197 lb (89.4 kg)   SpO2 97%   BMI 31.80 kg/m   Physical Exam  Constitutional: She is oriented to person, place, and time.  Cardiovascular: Normal rate, regular rhythm and normal heart sounds.  Pulmonary/Chest: Effort normal and breath sounds normal.  Abdominal: Soft. Bowel sounds are normal.  Musculoskeletal: Normal range of motion.  Low back pain  Neurological: She is alert and oriented to person, place, and time.  Skin: Skin is warm and dry.  Psychiatric: She has a normal mood and  affect. Her behavior is normal. Judgment and thought content normal.  Vitals reviewed.   Imaging: Ct Chest W Contrast  Result Date: 04/08/2018 CLINICAL DATA:  Granulosa cell tumor of the ovary. Elevated tumor markers. EXAM: CT CHEST, ABDOMEN, AND PELVIS WITH CONTRAST TECHNIQUE: Multidetector CT imaging of the chest, abdomen and pelvis was performed following the standard protocol during bolus administration of intravenous contrast. CONTRAST:  41mL ISOVUE-300 IOPAMIDOL (ISOVUE-300) INJECTION 61% COMPARISON:  CTA chest/abdomen/pelvis 08/26/2017. FINDINGS: CT CHEST FINDINGS Cardiovascular: The heart size is normal. No substantial pericardial effusion. Atherosclerotic calcification is noted in the wall of the thoracic aorta. Status post mitral valve replacement. Left-sided permanent pacemaker/AICD evident. Mediastinum/Nodes: No mediastinal lymphadenopathy. 15 mm short axis paratracheal lymph node measured on the prior study has decreased in the interval now measuring 7 mm. There is no hilar lymphadenopathy. The esophagus has normal imaging features. There is no axillary lymphadenopathy. Lungs/Pleura: The central tracheobronchial airways are patent. Centrilobular emphysema noted. 4 mm right perifissural nodule (71/3) is stable. 10 mm ground-glass nodule identified posterior right upper lobe on prior study is less confluent today and measures 7 mm. 4 mm ground-glass nodule identified in the left apex with additional peripheral ground-glass nodules. 6 mm left upper lobe nodule (42/3) is similar to prior. Interval development of subpleural plaque-like airspace opacity associated with volume loss. Evolving tiny pleural effusion versus pleural thickening evident. Musculoskeletal: No worrisome lytic or sclerotic osseous abnormality. Degenerative changes left shoulder with intra-articular loose bodies evident. Sclerotic lesion in the right T5 transverse process is stable. CT ABDOMEN PELVIS FINDINGS Hepatobiliary: No  focal abnormality within the liver parenchyma. Possible tiny dependent gallstones (62/2). No intrahepatic or extrahepatic biliary dilation. Pancreas: No focal mass lesion. No dilatation of the main duct. No intraparenchymal cyst. No peripancreatic edema. Spleen: No splenomegaly. No focal mass lesion. Adrenals/Urinary Tract: No adrenal nodule or mass. Kidneys unremarkable No evidence for hydroureter. The urinary bladder  appears normal for the degree of distention. Stomach/Bowel: Tiny hiatal hernia. Stomach is nondistended. No gastric wall thickening. No evidence of outlet obstruction. Duodenum is normally positioned as is the ligament of Treitz. No small bowel wall thickening. No small bowel dilatation. The terminal ileum is normal. The appendix is not visualized, but there is no edema or inflammation in the region of the cecum. No gross colonic mass. No colonic wall thickening. No substantial diverticular change. Anastomotic staple line noted in the rectosigmoid region. Vascular/Lymphatic: There is abdominal aortic atherosclerosis without aneurysm. There is no gastrohepatic or hepatoduodenal ligament lymphadenopathy. No intraperitoneal or retroperitoneal lymphadenopathy. No pelvic sidewall lymphadenopathy. Reproductive: Uterus surgically absent. Other: The multiple soft tissue masses in the central pelvis are again identified. Right paracolic gutter lesion measures 1.4 x 1.7 cm today compared to 2.1 x 2.9 cm previously. 4.5 x 5.4 cm mass just posterior to the bladder (113/2) has increased from 4.5 x 4.9 cm when I remeasure in a similar fashion on the prior study. The lesion in the right adnexal space appears more cystic and has decreased in the interval measuring 3.4 x 3.7 cm (108/2) compared to 3.9 x 5.5 cm previously. Cystic lesion in the presacral space has clearly progressed and a heterogeneously enhancing lesion posterior to the rectum and just anterior to the coccyx measures 5.2 x 3.7 cm today compared to 2.9  x 2.1 cm when I remeasure on the prior study. Enhancing nodules in the right presacral space (110/2) are new in the interval presacral lesion just distal to the aortic bifurcation measures 2.4 x 5.1 cm today. Musculoskeletal: Status post right hip replacement. No worrisome lytic or sclerotic osseous abnormality. IMPRESSION: 1. Granulosa cell tumor, by report on observation without current therapy. Somewhat confusing findings on today's exam given this history. The right paracolic gutter mass and a right adnexal mass have clearly decreased in size in the interval. Other masses appear relatively similar, but presacral disease and retroperitoneal disease at the aortic bifurcation have clearly progressed. Disease today represents a combination of mainly cystic, mainly solid, and combined appearance. 2. Interval development of subpleural plaque-like airspace opacity with volume loss in the left lower lobe, likely evolving scar/rounded atelectasis. Tiny left pleural effusion/thickening associated. Attention on follow-up recommended. 3. Bilateral pulmonary nodules, largely stable since prior exam. 4 mm ground-glass nodule in the left lung apex is new in the interval. Attention on follow-up exam suggested. 4.  Aortic Atherosclerois (ICD10-170.0) 5.  Emphysema. ([id number]-G26.9) Electronically Signed   By: Misty Stanley M.D.   On: 04/08/2018 13:53   Ct Abdomen Pelvis W Contrast  Result Date: 04/08/2018 CLINICAL DATA:  Granulosa cell tumor of the ovary. Elevated tumor markers. EXAM: CT CHEST, ABDOMEN, AND PELVIS WITH CONTRAST TECHNIQUE: Multidetector CT imaging of the chest, abdomen and pelvis was performed following the standard protocol during bolus administration of intravenous contrast. CONTRAST:  80mL ISOVUE-300 IOPAMIDOL (ISOVUE-300) INJECTION 61% COMPARISON:  CTA chest/abdomen/pelvis 08/26/2017. FINDINGS: CT CHEST FINDINGS Cardiovascular: The heart size is normal. No substantial pericardial effusion. Atherosclerotic  calcification is noted in the wall of the thoracic aorta. Status post mitral valve replacement. Left-sided permanent pacemaker/AICD evident. Mediastinum/Nodes: No mediastinal lymphadenopathy. 15 mm short axis paratracheal lymph node measured on the prior study has decreased in the interval now measuring 7 mm. There is no hilar lymphadenopathy. The esophagus has normal imaging features. There is no axillary lymphadenopathy. Lungs/Pleura: The central tracheobronchial airways are patent. Centrilobular emphysema noted. 4 mm right perifissural nodule (71/3) is stable. 10 mm ground-glass nodule  identified posterior right upper lobe on prior study is less confluent today and measures 7 mm. 4 mm ground-glass nodule identified in the left apex with additional peripheral ground-glass nodules. 6 mm left upper lobe nodule (42/3) is similar to prior. Interval development of subpleural plaque-like airspace opacity associated with volume loss. Evolving tiny pleural effusion versus pleural thickening evident. Musculoskeletal: No worrisome lytic or sclerotic osseous abnormality. Degenerative changes left shoulder with intra-articular loose bodies evident. Sclerotic lesion in the right T5 transverse process is stable. CT ABDOMEN PELVIS FINDINGS Hepatobiliary: No focal abnormality within the liver parenchyma. Possible tiny dependent gallstones (62/2). No intrahepatic or extrahepatic biliary dilation. Pancreas: No focal mass lesion. No dilatation of the main duct. No intraparenchymal cyst. No peripancreatic edema. Spleen: No splenomegaly. No focal mass lesion. Adrenals/Urinary Tract: No adrenal nodule or mass. Kidneys unremarkable No evidence for hydroureter. The urinary bladder appears normal for the degree of distention. Stomach/Bowel: Tiny hiatal hernia. Stomach is nondistended. No gastric wall thickening. No evidence of outlet obstruction. Duodenum is normally positioned as is the ligament of Treitz. No small bowel wall thickening.  No small bowel dilatation. The terminal ileum is normal. The appendix is not visualized, but there is no edema or inflammation in the region of the cecum. No gross colonic mass. No colonic wall thickening. No substantial diverticular change. Anastomotic staple line noted in the rectosigmoid region. Vascular/Lymphatic: There is abdominal aortic atherosclerosis without aneurysm. There is no gastrohepatic or hepatoduodenal ligament lymphadenopathy. No intraperitoneal or retroperitoneal lymphadenopathy. No pelvic sidewall lymphadenopathy. Reproductive: Uterus surgically absent. Other: The multiple soft tissue masses in the central pelvis are again identified. Right paracolic gutter lesion measures 1.4 x 1.7 cm today compared to 2.1 x 2.9 cm previously. 4.5 x 5.4 cm mass just posterior to the bladder (113/2) has increased from 4.5 x 4.9 cm when I remeasure in a similar fashion on the prior study. The lesion in the right adnexal space appears more cystic and has decreased in the interval measuring 3.4 x 3.7 cm (108/2) compared to 3.9 x 5.5 cm previously. Cystic lesion in the presacral space has clearly progressed and a heterogeneously enhancing lesion posterior to the rectum and just anterior to the coccyx measures 5.2 x 3.7 cm today compared to 2.9 x 2.1 cm when I remeasure on the prior study. Enhancing nodules in the right presacral space (110/2) are new in the interval presacral lesion just distal to the aortic bifurcation measures 2.4 x 5.1 cm today. Musculoskeletal: Status post right hip replacement. No worrisome lytic or sclerotic osseous abnormality. IMPRESSION: 1. Granulosa cell tumor, by report on observation without current therapy. Somewhat confusing findings on today's exam given this history. The right paracolic gutter mass and a right adnexal mass have clearly decreased in size in the interval. Other masses appear relatively similar, but presacral disease and retroperitoneal disease at the aortic bifurcation  have clearly progressed. Disease today represents a combination of mainly cystic, mainly solid, and combined appearance. 2. Interval development of subpleural plaque-like airspace opacity with volume loss in the left lower lobe, likely evolving scar/rounded atelectasis. Tiny left pleural effusion/thickening associated. Attention on follow-up recommended. 3. Bilateral pulmonary nodules, largely stable since prior exam. 4 mm ground-glass nodule in the left lung apex is new in the interval. Attention on follow-up exam suggested. 4.  Aortic Atherosclerois (ICD10-170.0) 5.  Emphysema. ([id number]-J67.9) Electronically Signed   By: Misty Stanley M.D.   On: 04/08/2018 13:53    Labs:  CBC: Recent Labs    09/12/17 3419  09/16/17 1712 09/17/17 0254 09/18/17 0240 03/06/18 1140  WBC 9.7  --   --  14.5* 13.9* 11.8*  HGB 13.1   < > 12.9 11.4* 10.9* 13.6  HCT 40.4   < > 38.0 35.4* 33.9* 40.8  PLT 148*  --   --  110* 97* 199   < > = values in this interval not displayed.    COAGS: Recent Labs    09/12/17 0821  01/27/18 1048 02/24/18 1036 04/07/18 1024 04/24/18 1039  INR 1.02   < > 2.0 2.3 3.1* 2.4  APTT 31  --   --   --   --   --    < > = values in this interval not displayed.    BMP: Recent Labs    08/26/17 1220 09/12/17 0821  09/16/17 1613 09/16/17 1712 09/17/17 0254 09/18/17 0240 03/06/18 1140  NA 139 136   < > 141 141 135 137 139  K 3.8 3.9   < > 3.8 4.3 4.7 3.7 3.8  CL 104 105   < > 104  --  104 103 107  CO2 26 20*  --   --   --  21* 24 26  GLUCOSE 92 87   < > 115* 117* 130* 99 101  BUN 10 9   < > 9  --  13 13 10   CALCIUM 9.0 9.3  --   --   --  8.5* 8.9 9.2  CREATININE 1.09* 1.24*   < > 1.00  --  1.14* 1.21* 1.40*  GFRNONAA 55* 47*  --   --   --  52* 48*  --   GFRAA >60 54*  --   --   --  >60 56*  --    < > = values in this interval not displayed.    LIVER FUNCTION TESTS: Recent Labs    06/03/17 1241 06/04/17 0047 09/12/17 0821 03/06/18 1140  BILITOT 0.7 0.9 0.6 0.5    AST 23 24 20 19   ALT 19 19 13* 26  ALKPHOS 61 61 58 71  PROT 6.8 6.4* 6.8 7.1  ALBUMIN 3.4* 3.2* 4.1 3.7    TUMOR MARKERS: No results for input(s): AFPTM, CEA, CA199, CHROMGRNA in the last 8760 hours.  Assessment and Plan:  Hx granulosa cell tumor New onset back pain and elevated tumor marker CT shows presacral mass Scheduled for biopsy of same Risks and benefits discussed with the patient including, but not limited to bleeding, infection, damage to adjacent structures or low yield requiring additional tests.  All of the patient's questions were answered, patient is agreeable to proceed. Consent signed and in chart.   Thank you for this interesting consult.  I greatly enjoyed meeting [name] and look forward to participating in their care.  A copy of this report was sent to the requesting provider on this date.  Electronically Signed: Lavonia Drafts, PA-C 05/01/2018, 9:50 AM   I spent a total of  30 Minutes   in face to face in clinical consultation, greater than 50% of which was counseling/coordinating care for pre sacral mass bx

## 2018-05-01 NOTE — Discharge Instructions (Signed)
Biopsy Discharge Instructions ° °The procedure you just had is called a biopsy.  You may feel some discomfort after the local anesthetic wears off.  Your discomfort should improve over the next several days. ° °AFTER YOUR BIOPSY °· Rest for the remainder of the day. °· Avoid heavy lifting (more than 10 lb/4.5 kg). °· If you have been given a general anesthetic or other medications to help you relax, you should not operate machinery, drive or make legal decisions for 24 hours after your procedure.  Additionally, someone must be available to drive you home. °· Only take over-the-counter or prescription medicines for pain, discomfort, or fever as directed by your caregiver.  This can make bleeding worse. °· You may resume your usual diet after the procedure. °· Avoid alcoholic beverages for 24 hours after your procedure. °· Keep the skin around your biopsy site clean and dry. °· You may shower after 24 hours.  Cleanse and dry the biopsy site completely after you shower.  Avoid baths and swimming for 72 hours. ° °Complications are very uncommon after this procedure.  Go to the nearest Emergency Department or contact your caregiver if you develop any of the following symptoms: °· Worsening pain °· Bleeding °· Swelling at the biopsy site °· Light headedness or dizziness °· Shortness of Breath °· Fever or chills °· Redness or increased pain or swelling at the biopsy site    °

## 2018-05-01 NOTE — H&P (Signed)
History and Physical    [name] GYI:[id number] DOB: 09/30/1960 DOA: 05/01/2018  PCP: Default, Provider, MD   Patient coming from: Home  Chief Complaint: Severe pain at tail bone   HPI: [name] is a 57 y.o. 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  Patient reports chronic pain at her tailbone that has been attributed to her tumors.  Pain has become severe and disabling recently despite her use of Percocet at home.  She had been lost to oncology follow-up for 2.5 years prior to return visit in September.  She underwent a biopsy of 1 of her tumors today by IR; there were no complications reported and the pain had been severe prior to the procedure.  She denies any chest pain, shortness of breath, fevers, chills, leg numbness or weakness, saddle anesthesia, or incontinence.  She reports loose stools intermittently.  Denies abdominal pain.  ED Course: Upon arrival to the ED, patient is found to be afebrile, saturating well on room air, and with vitals otherwise stable.  Chemistry panel is notable for potassium 3.1 and creatinine 1.10, similar to priors.  CBC features a chronic leukocytosis to 18,800 and a slight thrombocytopenia.  INR is subtherapeutic at 1.28.  Patient was treated with Tylenol, oral and IV Dilaudid, Toradol, a liter of normal saline, and 40 mEq of oral potassium.  She continued to be in severe pain.  Oncology was consulted by the ED physician and recommended one-time dose of Decadron which was given.  Patient will be observed for ongoing evaluation and management of intractable pain.  Review of Systems:  All other  systems reviewed and apart from HPI, are negative.  Past Medical History:  Diagnosis Date  . Arthritis    "everywhere"  . Asthma    as a child  . Automatic implantable cardioverter-defibrillator in situ    DR. Beckie Salts   . Bladder cancer [facility]) 2009   "injected medicine to get rid of it"  . Chronic systolic CHF (congestive heart failure) ([place])   . CKD (chronic kidney disease), stage III ([place])   . Coronary artery disease    a. large RV/inferior MI complicated by pseudoaneurysm s/p aneurysemectomy and CABG 10/2009 with papillary muscle rupture requiring bioprosthetic mitral valve replacement in [facility].  . Granulosa cell carcinoma of ovary ([place])    recurrent - surgical resection 2007, 2014 and 2016   . H/O thymectomy   . High cholesterol   . Hypothyroidism   . ICD (implantable cardiac defibrillator) in place   . Ischemic cardiomyopathy    a.  ICM and VT arrest 10/2009 s/p AICD (revision 01/2012 due to RV lead problem).  . Myocardial infarction ([place]) 10/14/2009  . Neuropathy    FEET AND HANDS - FROM CHEMO - BUT MUCH IMPROVED  . PAF (paroxysmal atrial fibrillation) ([place])   . Pain    JOINT PAINS AND MUSCLE ACHES ALL OVER.  Marland Kitchen Port-A-Cath in place    RIGHT UPPER CHEST  . Prosthetic valve dysfunction   . S/P mitral valve replacement with bioprosthetic valve 10/21/2009   Edwards Perimount stented bovine pericardial tissue valve, size 29 mm  . S/P valve-in-valve transcatheter mitral valve replacement 09/16/2017  29 mm Edwards Sapien 3 transcatheter heart valve placed via transapical approach  . SVT (supraventricular tachycardia) ([place])   . Tobacco abuse   . Ventricular tachycardia [facility])     Past Surgical History:  Procedure Laterality Date  . ABDOMINAL HYSTERECTOMY N/A 07/27/2013   Procedure: TUMOR EXCISION OF ABDOMINAL MASS;  Surgeon: Alvino Chapel, MD;  Location: [facility];  Service: Gynecology;  Laterality: N/A;  . APPENDECTOMY  1989  . BILATERAL OOPHORECTOMY  2007  .  CARDIAC DEFIBRILLATOR PLACEMENT  02/06/2012   "lead change"  . CARDIAC VALVE REPLACEMENT    . CYSTOSCOPY Right 02/23/2013   Procedure: CYSTOSCOPY WITH STENT REMOVAL;  Surgeon: Alvino Chapel, MD;  Location: [facility];  Service: Gynecology;  Laterality: Right;  . IMPLANTABLE CARDIOVERTER DEFIBRILLATOR REVISION N/A 02/06/2012   Procedure: IMPLANTABLE CARDIOVERTER DEFIBRILLATOR REVISION;  Surgeon: Evans Lance, MD;  Location: [facility] CATH LAB;  Service: Cardiovascular;  Laterality: N/A;  . INSERT / REPLACE / REMOVE PACEMAKER  10/2009   DUAL-CHAMBER St. JUDE DEFIBRILLATOR  . LAPAROTOMY N/A 01/19/2013   Procedure: TUMOR DEBULKING / BOWEL RESECTION/INSERTION RIGHT UTERERAL DOUBLE J STENT/OMENTECTOMY;  Surgeon: Alvino Chapel, MD;  Location: [facility];  Service: Gynecology;  Laterality: N/A;  . MITRAL VALVE REPLACEMENT  10/21/2009   63mm Edwards Perimount bovine pericardial tissue valve - [facility]  . RIGHT/LEFT HEART CATH AND CORONARY ANGIOGRAPHY N/A 07/02/2017   Procedure: RIGHT/LEFT HEART CATH AND CORONARY ANGIOGRAPHY;  Surgeon: Belva Crome, MD;  Location: [place] CV LAB;  Service: Cardiovascular;  Laterality: N/A;  . TEE WITHOUT CARDIOVERSION N/A 09/16/2017   Procedure: TRANSESOPHAGEAL ECHOCARDIOGRAM (TEE);  Surgeon: [name], MD;  Location: [name];  Service: Open Heart Surgery;  Laterality: N/A;  . THYMECTOMY  2009  . TONSILLECTOMY AND ADENOIDECTOMY  ~ 1967  . TOTAL HIP ARTHROPLASTY  05/06/2012   Procedure: TOTAL HIP ARTHROPLASTY;  Surgeon: Gearlean Alf, MD;  Location: [facility];  Service: Orthopedics;  Laterality: Right;  . TRANSCATHETER MITRAL VALVE REPLACEMENT, TRANSAPICAL N/A 09/16/2017   Procedure: TRANSCATHETER MITRAL VALVE REPLACEMENT,TRANSAPICAL;  Surgeon: [name], MD;  Location: [place];  Service: Open Heart Surgery;  Laterality: N/A;  . TRANSURETHRAL RESECTION OF BLADDER  2009   "for bladder cancer"  . TUBAL LIGATION  1993  . VAGINAL HYSTERECTOMY  2001      reports that she has been smoking cigarettes. She started smoking about 44 years ago. She has a 15.00 pack-year smoking history. She has never used smokeless tobacco. She reports that she does not drink alcohol or use drugs.  Allergies  Allergen Reactions  . Prochlorperazine Edisylate Other (See Comments)    Nervous/ flutter/ shakes--compazine  . Adhesive [Tape] Other (See Comments)    Redness/skin peeling Redness/hives from adhesive tape( tolerates latex gloves); tolerates paper tape  . Prochlorperazine Other (See Comments)    Jitters, hyper    Family History  Problem Relation Age of Onset  . Heart attack Paternal Grandfather   . Heart attack Maternal Grandfather   . Stroke Maternal Grandmother   . Heart attack Brother      Prior to Admission medications   Medication Sig Start Date End Date Taking? Authorizing Provider  acetaminophen (TYLENOL) 500 MG tablet Take 2 tablets (1,000 mg total) by mouth every 6 (six) hours. Patient taking differently: Take 1,000 mg by mouth every 6 (six) hours as needed for moderate pain or headache.  09/19/17  Yes Gold, Wayne E, PA-C  albuterol (PROVENTIL HFA;VENTOLIN HFA) 108 (90 Base)  MCG/ACT inhaler Inhale 2 puffs into the lungs every 4 (four) hours as needed for wheezing or shortness of breath. 06/06/17  Yes Eugenie Filler, MD  amoxicillin (AMOXIL) 500 MG tablet Take 4 tablets (2,000 mg total) by mouth as directed. Take one hour prior to dental visits. 10/31/17  Yes [name], MD  aspirin EC 81 MG EC tablet Take 1 tablet (81 mg total) by mouth daily. 09/19/17  Yes Gold, Wayne E, PA-C  atorvastatin (LIPITOR) 80 MG tablet Take 1 tablet (80 mg total) by mouth daily at 6 PM. 09/19/17  Yes Gold, Wayne E, PA-C  b complex vitamins tablet Take 1 tablet by mouth 2 (two) times daily.    Yes [provider]  carvedilol (COREG) 3.125 MG tablet Take 1 tablet (3.125 mg total) by mouth 2 (two) times daily with a meal. 10/06/17  Yes Lars Pinks  M, PA-C  cholecalciferol (VITAMIN D-400) 400 UNITS TABS tablet Take 400 Units by mouth 2 (two) times daily.   Yes [provider]  diphenhydrAMINE (BENADRYL) 25 MG tablet Take 25 mg by mouth every 6 (six) hours as needed for itching or allergies.    Yes [provider]  Diphenhydramine-APAP (PERCOGESIC EXTRA STRENGTH) 12.5-500 MG TABS Take 2 tablets by mouth 2 (two) times daily as needed (for pain.). [place]   Yes [provider]  enoxaparin (LOVENOX) 80 MG/0.8ML injection Inject 0.8 mLs (80 mg total) into the skin every 12 (twelve) hours. 04/24/18  Yes Belva Crome, MD  furosemide (LASIX) 20 MG tablet TAKE 1 TAB BY MOUTH DAILY. MAY TAKE AN EXTRA TAB AS NEEDED FOR LEG SWELLING OR SHORTNESS OF BREATH Patient taking differently: Take 20 mg by mouth daily.  10/29/17  Yes Evans Lance, MD  LORazepam (ATIVAN) 0.5 MG tablet TAKE 1 TABLET BY MOUTH EVERY 6 HOURS AS NEEDED FOR NAUSEA AND VOMITING Patient taking differently: Take 0.5 mg by mouth every 6 (six) hours as needed (for nausea and vomiting).  12/11/17  Yes Volanda Napoleon, MD  oxyCODONE-acetaminophen (PERCOCET/ROXICET) 5-325 MG tablet Take 1-2 tablets by mouth every 4 (four) hours as needed for severe pain. 04/30/18  Yes Volanda Napoleon, MD  spironolactone (ALDACTONE) 25 MG tablet Take 1 tablet (25 mg total) by mouth daily. 06/11/17 06/06/18 Yes Belva Crome, MD  Vitamin D, Ergocalciferol, (DRISDOL) 50000 units CAPS capsule TAKE 1 CAPSULE (50,000 UNITS TOTAL) BY MOUTH EVERY 7 (SEVEN) DAYS. Patient taking differently: Take 50,000 Units by mouth every Thursday.  10/23/17  Yes Volanda Napoleon, MD  warfarin (COUMADIN) 5 MG tablet Take 1 tablet (5 mg total) by mouth daily at 6 PM. As directed by the coumadin clinic Patient taking differently: Take 5-7.5 mg by mouth See admin instructions. Take 7.5 mg by mouth daily on Monday, Wednesday and Friday. Take 5 mg by mouth daily on all other days. 12/04/17  Yes Belva Crome, MD    zolpidem (AMBIEN) 5 MG tablet TAKE 1 TABLET BY MOUTH EVERYDAY AT BEDTIME Patient taking differently: Take 5 mg by mouth at bedtime.  02/16/18  Yes Volanda Napoleon, MD    Physical Exam: Vitals:   05/01/18 2115 05/01/18 2130 05/01/18 2145 05/01/18 2200  BP: 96/62 97/65 103/60 116/66  Pulse: 77 79 75 77  Resp: 16     Temp:      TempSrc:      SpO2: 96% 95% 100% 98%  Weight:      Height:  Constitutional: NAD, in apparent discomfort Eyes: PERTLA, lids and conjunctivae normal ENMT: Mucous membranes are moist. Posterior pharynx clear of any exudate or lesions.   Neck: normal, supple, no masses, no thyromegaly Respiratory: clear to auscultation bilaterally, no wheezing, no crackles. Normal respiratory effort.    Cardiovascular: S1 & S2 heard, regular rate and rhythm. No extremity edema.   Abdomen: No distension, no tenderness, soft. Bowel sounds normal.  Musculoskeletal: no clubbing / cyanosis. No joint deformity upper and lower extremities.   Skin: no significant rashes, lesions, ulcers. Warm, dry, well-perfused. Neurologic: CN 2-12 grossly intact. Sensation intact. Strength 5/5 in all 4 limbs.  Psychiatric:  Alert and oriented x 3. Pleasant and cooperative.    Labs on Admission: I have personally reviewed following labs and imaging studies  CBC: Recent Labs  Lab 05/01/18 0925 05/01/18 1501  WBC 18.8*  --   HGB 12.4 11.6*  HCT 38.7 34.0*  MCV 91.7  --   PLT 145*  --    Basic Metabolic Panel: Recent Labs  Lab 05/01/18 1501  NA 137  K 3.1*  CL 102  GLUCOSE 105*  BUN 12  CREATININE 1.10*   GFR: Estimated Creatinine Clearance: 62.7 mL/min (A) (by C-G formula based on SCr of 1.1 mg/dL (H)). Liver Function Tests: No results for input(s): AST, ALT, ALKPHOS, BILITOT, PROT, ALBUMIN in the last 168 hours. No results for input(s): LIPASE, AMYLASE in the last 168 hours. No results for input(s): AMMONIA in the last 168 hours. Coagulation Profile: Recent Labs  Lab  05/01/18 1028  INR 1.28   Cardiac Enzymes: No results for input(s): CKTOTAL, CKMB, CKMBINDEX, TROPONINI in the last 168 hours. BNP (last 3 results) No results for input(s): PROBNP in the last 8760 hours. HbA1C: No results for input(s): HGBA1C in the last 72 hours. CBG: No results for input(s): GLUCAP in the last 168 hours. Lipid Profile: No results for input(s): CHOL, HDL, LDLCALC, TRIG, CHOLHDL, LDLDIRECT in the last 72 hours. Thyroid Function Tests: No results for input(s): TSH, T4TOTAL, FREET4, T3FREE, THYROIDAB in the last 72 hours. Anemia Panel: No results for input(s): VITAMINB12, FOLATE, FERRITIN, TIBC, IRON, RETICCTPCT in the last 72 hours. Urine analysis:    Component Value Date/Time   COLORURINE YELLOW 05/01/2018 1537   APPEARANCEUR CLEAR 05/01/2018 1537   LABSPEC 1.028 05/01/2018 1537   LABSPEC 1.015 02/15/2013 1036   PHURINE 6.0 05/01/2018 1537   GLUCOSEU NEGATIVE 05/01/2018 1537   GLUCOSEU Negative 02/15/2013 1036   HGBUR NEGATIVE 05/01/2018 1537   BILIRUBINUR NEGATIVE 05/01/2018 1537   BILIRUBINUR Negative 02/15/2013 1036   KETONESUR NEGATIVE 05/01/2018 1537   PROTEINUR NEGATIVE 05/01/2018 1537   UROBILINOGEN 0.2 02/15/2013 1036   NITRITE NEGATIVE 05/01/2018 1537   LEUKOCYTESUR NEGATIVE 05/01/2018 1537   LEUKOCYTESUR Moderate 02/15/2013 1036   Sepsis Labs: @LABRCNTIP (procalcitonin:4,lacticidven:4) ) Recent Results (from the past 240 hour(s))  Culture, Urine     Status: Abnormal   Collection Time: 04/30/18  2:32 PM  Result Value Ref Range Status   Specimen Description   Final    URINE, CLEAN CATCH Performed at [facility] at [facility], [address] [place], [place] [address]    Special Requests   Final    URINE, CLEAN CATCH Performed at [facility], [address] [place], [place] [address]    Culture (A)  Final    <10,000 COLONIES/mL INSIGNIFICANT GROWTH Performed at [facility], [address] [place], [place] [address]    Report Status 05/01/2018 FINAL  Final     Radiological Exams on Admission: Ct Abdomen Pelvis W Contrast  Result Date: 05/01/2018 CLINICAL DATA:  Pain in the region of tailbone biopsy EXAM: CT ABDOMEN AND PELVIS WITH CONTRAST TECHNIQUE: Multidetector CT imaging of the abdomen and pelvis was performed using the standard protocol following bolus administration of intravenous contrast. CONTRAST:  123mL OMNIPAQUE IOHEXOL 300 MG/ML  SOLN COMPARISON:  05/01/2018, 04/08/2018, 08/26/2017, 06/03/2017 CT FINDINGS: Lower chest: Lung bases again demonstrate small left pleural effusion with platelike consolidation in the posterior peripheral left lung base, similar as compared with 04/08/2018. Partially visualized cardiac pacing leads. Borderline cardiomegaly. Hepatobiliary: No focal liver abnormality is seen. No gallstones, gallbladder wall thickening, or biliary dilatation. Pancreas: Unremarkable. No pancreatic ductal dilatation or surrounding inflammatory changes. Spleen: Normal in size without focal abnormality. Adrenals/Urinary Tract: Adrenal glands are unremarkable. Kidneys are normal, without renal calculi, focal lesion, or hydronephrosis. Mass-effect on the posterior bladder by pelvic mass. Stomach/Bowel: Stomach is nonenlarged. No dilated small bowel. No colon wall thickening. Postsurgical changes of the rectosigmoid colon. Sigmoid colon difficult to separate from central pelvic mass. Vascular/Lymphatic: Moderate aortic atherosclerosis. No aneurysmal dilatation. Reproductive: History of hysterectomy and oophorectomy on epic. Other: Right paracolic gutter lesion measures 14 x 10 mm compared with 14 x 17 mm previously. Multiple pelvic masses are again identified. Mass inferior to the iliac bifurcation measures 47 x 26 mm compared with 51 x 24 mm previously. Right adnexal low-density lesion measures 36 x 24 mm compared with 34 x 37 mm previously. Central  low pelvic mass posterior to the bladder measures 44 x 54 mm, previously 54 x 45 mm. Increased size of presacral lesion, this measures 64 x 67 mm, compared with 51 x 37 mm. Surrounding hazy attenuation, may be secondary to post biopsy change or inflammation of the mass. There is mass effect on the adjacent rectum. Size of this mass does not appear significantly changed when compared to the biopsy images performed today. Multiple additional presacral masses are noted. There are multiple enhancing nodules in the right pelvic fat. There is edema in the perirectal space. Musculoskeletal: No acute or suspicious osseous abnormality. Prior right hip replacement. IMPRESSION: 1. Multiple known pelvic masses. Significant increase in size of the biopsied presacral lesion as compared with 04/08/2018. There is soft tissue stranding surrounding the mass which may be secondary to post biopsy change or inflammation of the mass. This mass exerts mass effect on the rectum. There is perirectal edema. There are multiple additional pelvic masses which are grossly stable in size since the comparison CT. The mass situated posterior to the bladder is difficult to separate from adjacent sigmoid colon and question invasion. 2. Similar appearance of small left pleural effusion with platelike consolidation in the peripheral left lung base. Electronically Signed   By: Donavan Foil M.D.   On: 05/01/2018 18:58   Ct Biopsy  Result Date: 05/01/2018 INDICATION: 57 year old female with a history of granulosa cell tumor of the ovary, unspecified laterality. She has demonstrated evidence of progressive disease on her most recent CT imaging and presents for CT-guided biopsy of the same. Of note, she has had progressive severe pelvic pain over the past 5 days which is unrelieved by Percocet. She intends on going to the emergency room following her procedure and recovery today for further evaluation and management. I have contacted her oncologist,  Dr. Marin Olp, and he is aware and agrees with her visiting  the emergency room. The patient still desires to proceed with the biopsy in order to get tissue for molecular studies. EXAM: CT BIOPSY MEDICATIONS: None. ANESTHESIA/SEDATION: Moderate (conscious) sedation was employed during this procedure. A total of Versed 1 mg and Fentanyl 50 mcg was administered intravenously. Moderate Sedation Time: 15 minutes. The patient's level of consciousness and vital signs were monitored continuously by radiology nursing throughout the procedure under my direct supervision. FLUOROSCOPY TIME:  Fluoroscopy Time: 0 minutes 0 seconds (0 mGy). COMPLICATIONS: None immediate. PROCEDURE: Informed written consent was obtained from the patient after a thorough discussion of the procedural risks, benefits and alternatives. All questions were addressed. A timeout was performed prior to the initiation of the procedure. A planning axial CT scan was performed. The heterogeneous mass in the presacral space was successfully identified. A suitable skin entry site was selected and marked. The region was sterilely prepped and draped in standard fashion using chlorhexidine skin prep. Local anesthesia was attained by infiltration with 1% lidocaine. A small dermatotomy was made. Under intermittent CT guidance, an 18 gauge introducer needle was advanced into the margin of the mass. Multiple 18 gauge core biopsies were then coaxially obtained using the bio Pince automated biopsy device. Biopsy specimens were placed in formalin and delivered to pathology for further analysis. The introducer needle was removed. Post biopsy axial CT imaging demonstrates no evidence of immediate complication. The patient tolerated the procedure well. IMPRESSION: Technically successful CT-guided core biopsy of presacral mass. Electronically Signed   By: Jacqulynn Cadet M.D.   On: 05/01/2018 13:44    EKG: Not performed.   Assessment/Plan   1. Intractable pain  -  Patient has chronic pain in coccygeal region related to her tumors, has had recent increase in pain that has become disabling and not adequately controlled with her home medications  - Oncology was consulted by ED physician and recommended one-time dose of Decadron which was given  - She reports some relief after toradol, APAP, oral Dilaudid, IV Dilaudid x2, and IV morphine x1  - No LE numbness or weakness, no incontinence or anaesthesia  - Continue pain-control with IV agents for now and try to convert to oral analgesics as tolerated    2. Granulosa cell tumor of ovary  - Followed by oncology; had been lost to follow up fro 2.5 yrs prior to return visit in September   - CT performed in ED with multiple known pelvic masses and increasing size of the biopsied lesion - She had a tumor biopsied by IR on day of admission  - She will follow-up with oncology for review of biopsy and CT findings   3. CKD stage III  - SCr is 1.10 on admission, similar to priors  - Renally-dose medications   4. Chronic systolic CHF  - Appears compensated  - EF 45% in May, has hx of VT with ICD in place  - Continue Lasix, Aldactone, and Coreg  - Follow daily wt    5. Paroxysmal atrial fibrillation  - In a regular rhythm on admission  - CHADS-VASc is at least 2 (gender, CHF) and she also has bioprosthetic valve  - Managed with warfarin, had been held for biopsy that she had earlier today, INR 1.28 in ED, resume warfarin as planned with Lovenox bridge    6. Hypokalemia  - Serum potassium was 3.1 in ED  - Treated with 40 mEq oral potassium in ED  - Repeat chem panel in am    7. COPD  -  No wheezing or SOB  - Continue as-needed albuterol     DVT prophylaxis: Warfarin with Lovenox bridge  Code Status: Full   Family Communication: Family updated at bedside Consults called: Oncology consulted by ED physician  Admission status: Observation    Vianne Bulls, MD [facility] Pager [phone]  If  7PM-7AM, please contact night-coverage [email or web address] Password [email or web address]  05/01/2018, 10:09 PM

## 2018-05-01 NOTE — Procedures (Signed)
Interventional Radiology Procedure Note  Procedure: CT guided biopsy of presacral mass  Complications: None  Estimated Blood Loss: None  Recommendations: - Bedrest x 1 hr - DC home.  Patient intends to go to the ER after her recovery period due to progressive pelvic pain over the past 5 days.  Pain was present before the procedure.  I will also contact Dr. Marin Olp and make him aware.   Signed,  Criselda Peaches, MD

## 2018-05-02 DIAGNOSIS — D3910 Neoplasm of uncertain behavior of unspecified ovary: Secondary | ICD-10-CM

## 2018-05-02 DIAGNOSIS — N183 Chronic kidney disease, stage 3 (moderate): Secondary | ICD-10-CM

## 2018-05-02 DIAGNOSIS — R52 Pain, unspecified: Secondary | ICD-10-CM

## 2018-05-02 LAB — BASIC METABOLIC PANEL
Anion gap: 7 (ref 5–15)
BUN: 14 mg/dL (ref 6–20)
CHLORIDE: 106 mmol/L (ref 98–111)
CO2: 22 mmol/L (ref 22–32)
Calcium: 8.8 mg/dL — ABNORMAL LOW (ref 8.9–10.3)
Creatinine, Ser: 1.1 mg/dL — ABNORMAL HIGH (ref 0.44–1.00)
GFR calc Af Amer: >60 mL/min (ref >60)
GFR calc non Af Amer: 54 mL/min — ABNORMAL LOW (ref >60)
Glucose, Bld: 117 mg/dL — ABNORMAL HIGH (ref 70–99)
POTASSIUM: 4.3 mmol/L (ref 3.5–5.1)
SODIUM: 135 mmol/L (ref 135–145)

## 2018-05-02 LAB — CBC WITH DIFFERENTIAL/PLATELET
Abs Immature Granulocytes: 0.08 10*3/uL — ABNORMAL HIGH (ref 0.00–0.07)
Basophils Absolute: 0 10*3/uL (ref 0.0–0.1)
Basophils Relative: 0 %
Eosinophils Absolute: 0 10*3/uL (ref 0.0–0.5)
Eosinophils Relative: 0 %
HCT: 34.2 % — ABNORMAL LOW (ref 36.0–46.0)
Hemoglobin: 11.1 g/dL — ABNORMAL LOW (ref 12.0–15.0)
Immature Granulocytes: 1 %
Lymphocytes Relative: 4 %
Lymphs Abs: 0.7 10*3/uL (ref 0.7–4.0)
MCH: 30 pg (ref 26.0–34.0)
MCHC: 32.5 g/dL (ref 30.0–36.0)
MCV: 92.4 fL (ref 80.0–100.0)
Monocytes Absolute: 0.3 10*3/uL (ref 0.1–1.0)
Monocytes Relative: 2 %
Neutro Abs: 15.6 10*3/uL — ABNORMAL HIGH (ref 1.7–7.7)
Neutrophils Relative %: 93 %
Platelets: 111 10*3/uL — ABNORMAL LOW (ref 150–400)
RBC: 3.7 MIL/uL — ABNORMAL LOW (ref 3.87–5.11)
RDW: 15.1 % (ref 11.5–15.5)
WBC: 16.7 10*3/uL — ABNORMAL HIGH (ref 4.0–10.5)
nRBC: 0 % (ref 0.0–0.2)

## 2018-05-02 LAB — PROTIME-INR
INR: 1.17
Prothrombin Time: 14.8 s (ref 11.4–15.2)

## 2018-05-02 LAB — MAGNESIUM: Magnesium: 1.7 mg/dL (ref 1.7–2.4)

## 2018-05-02 MED ORDER — HYDROMORPHONE HCL 2 MG PO TABS
1.0000 mg | ORAL_TABLET | ORAL | Status: DC | PRN
  Administered 2018-05-02: 2 mg via ORAL
  Filled 2018-05-02: qty 1

## 2018-05-02 MED ORDER — HYDROMORPHONE HCL 2 MG PO TABS: 1.0000 mg | ORAL_TABLET | ORAL | 0 refills | Status: DC | PRN

## 2018-05-02 MED ORDER — ACETAMINOPHEN 500 MG PO TABS: 1000.0000 mg | ORAL_TABLET | Freq: Four times a day (QID) | ORAL | Status: DC | PRN

## 2018-05-02 MED ORDER — WARFARIN SODIUM 5 MG PO TABS: 5.0000 mg | ORAL_TABLET | ORAL | Status: DC

## 2018-05-02 MED ORDER — WARFARIN SODIUM 10 MG PO TABS: 10.0000 mg | ORAL_TABLET | Freq: Once | ORAL | Status: DC

## 2018-05-02 MED ORDER — LORAZEPAM 0.5 MG PO TABS: 0.5000 mg | ORAL_TABLET | Freq: Four times a day (QID) | ORAL | Status: DC | PRN

## 2018-05-02 NOTE — Discharge Summary (Signed)
Physician Discharge Summary  [name] ZOX:[id number] DOB: [birth year] DOA: 05/01/2018  PCP: Default, Provider, MD  Admit date: 05/01/2018 Discharge date: 05/02/2018  Admitted From: home Discharge disposition: home   Recommendations for Outpatient Follow-Up:   1. Pain control-- dilaudid PO worked best 2. Patient to follow up with oncology once biopsy back   Discharge Diagnosis:   Principal Problem:   Intractable pain Active Problems:   Chronic systolic CHF (congestive heart failure) (HCC)   Granulosa cell tumor of ovary   CKD (chronic kidney disease) stage 3, GFR 30-59 ml/min (HCC)   Paroxysmal atrial fibrillation (HCC)   COPD (chronic obstructive pulmonary disease) (HCC)   Hypokalemia    Discharge Condition: Improved.  Diet recommendation:   Regular.  Wound care: None.  Code status: Full.   History of Present Illness:   [name] is a 57 y.o. 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  Patient reports chronic pain at her tailbone that has been attributed to her tumors.  Pain has become severe and disabling recently despite her use of Percocet at home.  She had been lost to oncology follow-up for 2.5 years prior to return visit in September.  She underwent a biopsy of 1 of her tumors today by IR; there were no complications reported and the pain had been severe prior to the procedure.  She denies any chest pain, shortness of breath, fevers, chills, leg numbness or weakness, saddle anesthesia, or incontinence.  She reports loose stools intermittently.  Denies abdominal pain.   Hospital Course by Problem:     1. Intractable pain  - Patient has chronic pain in coccygeal region related to her tumors, has had recent increase in pain that has become disabling and not adequately controlled with her home medications  - Oncology was consulted by ED physician and recommended one-time dose of Decadron which was given  - No LE numbness or weakness, no incontinence or anaesthesia  - discussed pain control with patient and she had been given oral dilaudid in ER with the best pain control  2. Granulosa cell tumor of ovary  - Followed by oncology; had been lost to follow up for 2.5 yrs prior to return visit in September   - CT performed in ED with multiple known pelvic masses and increasing size of the biopsied lesion - She had a tumor biopsied by IR on day of admission  - Dr. Elnoria Howard follow up  3. CKD stage III  - SCr is 1.10 on admission, similar to priors  - Renally-dose medications   4. Chronic systolic CHF  - Appears compensated  - EF 45% in May, has hx of VT with ICD in place  - Continue Lasix, Aldactone, and Coreg   5. Paroxysmal atrial fibrillation  - In a regular rhythm on admission  - CHADS-VASc is at least 2 (gender, CHF) and she also has bioprosthetic valve  - Managed with warfarin, had been held for biopsy, resume warfarin as planned with Lovenox bridge    6. Hypokalemia  - repleted  7. COPD  - No wheezing or SOB  - Continue as-needed albuterol      Medical Consultants:  oncology   Discharge Exam:   Vitals:   05/02/18 0010 05/02/18 0700  BP: (!) 100/49 (!) 95/52  Pulse: 73 76  Resp: 16 20  Temp: 98.8 F (37.1 C) 98.9 F (37.2 C)  SpO2: 98% 98%   Vitals:   05/01/18 2200 05/01/18 2229 05/02/18 0010 05/02/18 0700  BP: 116/66 (!) 93/53 (!) 100/49 (!) 95/52  Pulse: 77 80 73 76  Resp:  16 16 20   Temp:  98.8 F (37.1 C) 98.8 F (37.1 C) 98.9 F (37.2 C)  TempSrc:  Oral Oral Oral  SpO2: 98% 96% 98% 98%  Weight:      Height:        General exam: Appears  calm and comfortable.   The results of significant diagnostics from this hospitalization (including imaging, microbiology, ancillary and laboratory) are listed below for reference.     Procedures and Diagnostic Studies:   Ct Abdomen Pelvis W Contrast  Result Date: 05/01/2018 CLINICAL DATA:  Pain in the region of tailbone biopsy EXAM: CT ABDOMEN AND PELVIS WITH CONTRAST TECHNIQUE: Multidetector CT imaging of the abdomen and pelvis was performed using the standard protocol following bolus administration of intravenous contrast. CONTRAST:  17mL OMNIPAQUE IOHEXOL 300 MG/ML  SOLN COMPARISON:  05/01/2018, 04/08/2018, 08/26/2017, 06/03/2017 CT FINDINGS: Lower chest: Lung bases again demonstrate small left pleural effusion with platelike consolidation in the posterior peripheral left lung base, similar as compared with 04/08/2018. Partially visualized cardiac pacing leads. Borderline cardiomegaly. Hepatobiliary: No focal liver abnormality is seen. No gallstones, gallbladder wall thickening, or biliary dilatation. Pancreas: Unremarkable. No pancreatic ductal dilatation or surrounding inflammatory changes. Spleen: Normal in size without focal abnormality. Adrenals/Urinary Tract: Adrenal glands are unremarkable. Kidneys are normal, without renal calculi, focal lesion, or hydronephrosis. Mass-effect on the posterior bladder by pelvic mass. Stomach/Bowel: Stomach is nonenlarged. No dilated small bowel. No colon wall thickening. Postsurgical changes of the rectosigmoid colon. Sigmoid colon difficult to separate from central pelvic mass. Vascular/Lymphatic: Moderate aortic atherosclerosis. No aneurysmal dilatation. Reproductive: History of hysterectomy and oophorectomy on epic. Other: Right paracolic gutter lesion measures 14 x 10 mm compared with 14 x 17 mm previously. Multiple pelvic masses are again identified. Mass inferior to the iliac bifurcation measures 47 x 26 mm compared with 51 x 24 mm previously. Right  adnexal low-density lesion measures 36 x 24 mm compared with 34 x 37 mm previously. Central low pelvic mass posterior to the bladder measures 44 x 54 mm, previously 54 x 45 mm. Increased size of presacral lesion, this measures 64 x 67 mm, compared with 51 x 37 mm. Surrounding hazy attenuation, may be secondary to post biopsy change or inflammation of the mass. There is mass effect on the adjacent rectum. Size of this mass does not appear significantly changed when compared to the biopsy images performed today. Multiple additional presacral masses are noted. There are multiple enhancing nodules in the right pelvic fat. There is edema in the perirectal space. Musculoskeletal: No acute or suspicious osseous abnormality. Prior right hip replacement. IMPRESSION: 1. Multiple known pelvic masses. Significant increase in size of the biopsied presacral lesion as compared with 04/08/2018. There is soft tissue stranding surrounding the mass which may be secondary to post biopsy change or inflammation of the mass. This mass exerts mass effect on the rectum. There is perirectal edema. There are multiple additional pelvic masses which are grossly stable in size since the comparison CT. The mass situated posterior to the bladder is difficult to separate from adjacent sigmoid colon  and question invasion. 2. Similar appearance of small left pleural effusion with platelike consolidation in the peripheral left lung base. Electronically Signed   By: Donavan Foil M.D.   On: 05/01/2018 18:58   Ct Biopsy  Result Date: 05/01/2018 INDICATION: 57 year old female with a history of granulosa cell tumor of the ovary, unspecified laterality. She has demonstrated evidence of progressive disease on her most recent CT imaging and presents for CT-guided biopsy of the same. Of note, she has had progressive severe pelvic pain over the past 5 days which is unrelieved by Percocet. She intends on going to the emergency room following her procedure  and recovery today for further evaluation and management. I have contacted her oncologist, Dr. Marin Olp, and he is aware and agrees with her visiting the emergency room. The patient still desires to proceed with the biopsy in order to get tissue for molecular studies. EXAM: CT BIOPSY MEDICATIONS: None. ANESTHESIA/SEDATION: Moderate (conscious) sedation was employed during this procedure. A total of Versed 1 mg and Fentanyl 50 mcg was administered intravenously. Moderate Sedation Time: 15 minutes. The patient's level of consciousness and vital signs were monitored continuously by radiology nursing throughout the procedure under my direct supervision. FLUOROSCOPY TIME:  Fluoroscopy Time: 0 minutes 0 seconds (0 mGy). COMPLICATIONS: None immediate. PROCEDURE: Informed written consent was obtained from the patient after a thorough discussion of the procedural risks, benefits and alternatives. All questions were addressed. A timeout was performed prior to the initiation of the procedure. A planning axial CT scan was performed. The heterogeneous mass in the presacral space was successfully identified. A suitable skin entry site was selected and marked. The region was sterilely prepped and draped in standard fashion using chlorhexidine skin prep. Local anesthesia was attained by infiltration with 1% lidocaine. A small dermatotomy was made. Under intermittent CT guidance, an 18 gauge introducer needle was advanced into the margin of the mass. Multiple 18 gauge core biopsies were then coaxially obtained using the bio Pince automated biopsy device. Biopsy specimens were placed in formalin and delivered to pathology for further analysis. The introducer needle was removed. Post biopsy axial CT imaging demonstrates no evidence of immediate complication. The patient tolerated the procedure well. IMPRESSION: Technically successful CT-guided core biopsy of presacral mass. Electronically Signed   By: Jacqulynn Cadet M.D.   On:  05/01/2018 13:44     Labs:   Basic Metabolic Panel: Recent Labs  Lab 05/01/18 1501 05/02/18 0623  NA 137 135  K 3.1* 4.3  CL 102 106  CO2  --  22  GLUCOSE 105* 117*  BUN 12 14  CREATININE 1.10* 1.10*  CALCIUM  --  8.8*  MG  --  1.7   GFR Estimated Creatinine Clearance: 62.7 mL/min (A) (by C-G formula based on SCr of 1.1 mg/dL (H)). Liver Function Tests: No results for input(s): AST, ALT, ALKPHOS, BILITOT, PROT, ALBUMIN in the last 168 hours. No results for input(s): LIPASE, AMYLASE in the last 168 hours. No results for input(s): AMMONIA in the last 168 hours. Coagulation profile Recent Labs  Lab 05/01/18 1028 05/02/18 0623  INR 1.28 1.17    CBC: Recent Labs  Lab 05/01/18 0925 05/01/18 1501 05/02/18 0623  WBC 18.8*  --  16.7*  NEUTROABS  --   --  15.6*  HGB 12.4 11.6* 11.1*  HCT 38.7 34.0* 34.2*  MCV 91.7  --  92.4  PLT 145*  --  111*   Cardiac Enzymes: No results for input(s): CKTOTAL, CKMB, CKMBINDEX, TROPONINI in  the last 168 hours. BNP: Invalid input(s): POCBNP CBG: No results for input(s): GLUCAP in the last 168 hours. D-Dimer No results for input(s): DDIMER in the last 72 hours. Hgb A1c No results for input(s): HGBA1C in the last 72 hours. Lipid Profile No results for input(s): CHOL, HDL, LDLCALC, TRIG, CHOLHDL, LDLDIRECT in the last 72 hours. Thyroid function studies No results for input(s): TSH, T4TOTAL, T3FREE, THYROIDAB in the last 72 hours.  Invalid input(s): FREET3 Anemia work up No results for input(s): VITAMINB12, FOLATE, FERRITIN, TIBC, IRON, RETICCTPCT in the last 72 hours. Microbiology Recent Results (from the past 240 hour(s))  Culture, Urine     Status: Abnormal   Collection Time: 04/30/18  2:32 PM  Result Value Ref Range Status   Specimen Description   Final    URINE, CLEAN CATCH Performed at [facility] at [facility], [address], [place], [place] [address]    Special Requests   Final     URINE, CLEAN CATCH Performed at [facility] at [facility], [address], [place], [place] [address]    Culture (A)  Final    <10,000 COLONIES/mL INSIGNIFICANT GROWTH Performed at [facility] [address], [place], [place] [address]    Report Status 05/01/2018 FINAL  Final     Discharge Instructions:   Discharge Instructions    Diet - low sodium heart healthy   Complete by:  As directed    Discharge instructions   Complete by:  As directed    Resume prior directed coumadin dose after biopsy, INR check next week Bowel regimen while on pain medications Follow up with Dr. Elnoria Howard   Increase activity slowly   Complete by:  As directed      Allergies as of 05/02/2018      Reactions   Prochlorperazine Edisylate Other (See Comments)   Nervous/ flutter/ shakes--compazine   Adhesive [tape] Other (See Comments)   Redness/skin peeling Redness/hives from adhesive tape( tolerates latex gloves); tolerates paper tape   Prochlorperazine Other (See Comments)   Jitters, hyper      Medication List    STOP taking these medications   oxyCODONE-acetaminophen 5-325 MG tablet Commonly known as:  PERCOCET/ROXICET   PERCOGESIC EXTRA STRENGTH 12.5-500 MG Tabs Generic drug:  Diphenhydramine-APAP     TAKE these medications   acetaminophen 500 MG tablet Commonly known as:  TYLENOL Take 2 tablets (1,000 mg total) by mouth every 6 (six) hours as needed for moderate pain or headache.   albuterol 108 (90 Base) MCG/ACT inhaler Commonly known as:  PROVENTIL HFA;VENTOLIN HFA Inhale 2 puffs into the lungs every 4 (four) hours as needed for wheezing or shortness of breath.   amoxicillin 500 MG tablet Commonly known as:  AMOXIL Take 4 tablets (2,000 mg total) by mouth as directed. Take one hour prior to dental visits.   aspirin 81 MG EC tablet Take 1 tablet (81 mg total) by mouth daily.   atorvastatin 80 MG tablet Commonly known as:  LIPITOR Take 1  tablet (80 mg total) by mouth daily at 6 PM.   b complex vitamins tablet Take 1 tablet by mouth 2 (two) times daily.   carvedilol 3.125 MG tablet Commonly known as:  COREG Take 1 tablet (3.125 mg total) by mouth 2 (two) times daily with a meal.   diphenhydrAMINE 25 MG tablet Commonly known as:  BENADRYL Take 25 mg by mouth every 6 (six) hours as  needed for itching or allergies.   enoxaparin 80 MG/0.8ML injection Commonly known as:  LOVENOX Inject 0.8 mLs (80 mg total) into the skin every 12 (twelve) hours.   furosemide 20 MG tablet Commonly known as:  LASIX TAKE 1 TAB BY MOUTH DAILY. MAY TAKE AN EXTRA TAB AS NEEDED FOR LEG SWELLING OR SHORTNESS OF BREATH What changed:  See the new instructions.   HYDROmorphone 2 MG tablet Commonly known as:  DILAUDID Take 0.5-1 tablets (1-2 mg total) by mouth every 4 (four) hours as needed for up to 5 days for moderate pain or severe pain.   LORazepam 0.5 MG tablet Commonly known as:  ATIVAN Take 1 tablet (0.5 mg total) by mouth every 6 (six) hours as needed (for nausea and vomiting). What changed:  See the new instructions.   spironolactone 25 MG tablet Commonly known as:  ALDACTONE Take 1 tablet (25 mg total) by mouth daily.   Vitamin D (Ergocalciferol) 1.25 MG (50000 UT) Caps capsule Commonly known as:  DRISDOL TAKE 1 CAPSULE (50,000 UNITS TOTAL) BY MOUTH EVERY 7 (SEVEN) DAYS. What changed:  when to take this   VITAMIN D-400 10 MCG (400 UNIT) Tabs tablet Generic drug:  cholecalciferol Take 400 Units by mouth 2 (two) times daily.   warfarin 5 MG tablet Commonly known as:  COUMADIN Take as directed. If you are unsure how to take this medication, talk to your nurse or doctor. Original instructions:  Take 1-1.5 tablets (5-7.5 mg total) by mouth See admin instructions. Take 7.5 mg by mouth daily on Monday, Wednesday and Friday. Take 5 mg by mouth daily on all other days.   zolpidem 5 MG tablet Commonly known as:  AMBIEN TAKE 1 TABLET  BY MOUTH EVERYDAY AT BEDTIME What changed:  See the new instructions.      Follow-up Information    Belva Crome, MD Follow up.   Specialty:  Cardiology Why:  INR management Contact information: [address] [place] [address] [phone]        Volanda Napoleon, MD. Call.   Specialty:  Oncology Why:  after biopsy results Contact information: [address] [place] [address] [phone]            Time coordinating discharge: 25 min  Signed:  Geradine Girt DO  [facility] 05/02/2018, 12:48 PM

## 2018-05-02 NOTE — Progress Notes (Signed)
[place] for Lovenox/Warfarin Indication: atrial fibrillation and MVR  Patient Measurements: Height: 5\' 6"  (167.6 cm) Weight: 197 lb (89.4 kg) IBW/kg (Calculated) : 59.3   Vital Signs: Temp: 98.9 F (37.2 C) (11/09 0700) Temp Source: Oral (11/09 0700) BP: 95/52 (11/09 0700) Pulse Rate: 76 (11/09 0700)  Labs: Recent Labs    05/01/18 0925 05/01/18 1028 05/01/18 1501 05/02/18 0623  HGB 12.4  --  11.6* 11.1*  HCT 38.7  --  34.0* 34.2*  PLT 145*  --   --  111*  LABPROT  --  15.9*  --  14.8  INR  --  1.28  --  1.17  CREATININE  --   --  1.10* 1.10*    Estimated Creatinine Clearance: 62.7 mL/min (A) (by C-G formula based on SCr of 1.1 mg/dL (H)).    Assessment: 57 yo female with granulosa cell tumor. She is on warfarin prior to admission, she follows closely with our CVRR clinic. She had a biopsy today; therefore warfarin was held a few days ago and patient was bridged with Lovenox. She is being admitted for severe pain in her coccyx region.   PTA warfarin regimen: 7.5 q Mon/Wed/Fri, 5 mg on all other days  INR today - 1.17, CBC stable   Goal of Therapy:  INR 2-3 Monitor platelets by anticoagulation protocol: Yes   Plan:  Continue Lovenox 80 mg [place] q12h Repeat Warfarin 10 mg po x 1  Thank you Anette Guarneri, PharmD [phone]  05/02/2018,11:16 AM

## 2018-05-04 ENCOUNTER — Emergency Department (HOSPITAL_BASED_OUTPATIENT_CLINIC_OR_DEPARTMENT_OTHER): Payer: BLUE CROSS/BLUE SHIELD

## 2018-05-04 ENCOUNTER — Encounter (HOSPITAL_BASED_OUTPATIENT_CLINIC_OR_DEPARTMENT_OTHER): Payer: Self-pay

## 2018-05-04 ENCOUNTER — Inpatient Hospital Stay (HOSPITAL_BASED_OUTPATIENT_CLINIC_OR_DEPARTMENT_OTHER)
Admission: EM | Admit: 2018-05-04 | Discharge: 2018-05-15 | DRG: 940 | Disposition: A | Discharge status: Home / Self Care | Payer: BLUE CROSS/BLUE SHIELD | Attending: Hematology & Oncology | Admitting: Hematology & Oncology

## 2018-05-04 ENCOUNTER — Other Ambulatory Visit: Payer: Self-pay

## 2018-05-04 DIAGNOSIS — D3910 Neoplasm of uncertain behavior of unspecified ovary: Secondary | ICD-10-CM

## 2018-05-04 DIAGNOSIS — C569 Malignant neoplasm of unspecified ovary: Secondary | ICD-10-CM | POA: Diagnosis present

## 2018-05-04 DIAGNOSIS — Z823 Family history of stroke: Secondary | ICD-10-CM

## 2018-05-04 DIAGNOSIS — D72829 Elevated white blood cell count, unspecified: Secondary | ICD-10-CM | POA: Diagnosis present

## 2018-05-04 DIAGNOSIS — I2510 Atherosclerotic heart disease of native coronary artery without angina pectoris: Secondary | ICD-10-CM | POA: Diagnosis present

## 2018-05-04 DIAGNOSIS — Z79899 Other long term (current) drug therapy: Secondary | ICD-10-CM

## 2018-05-04 DIAGNOSIS — R52 Pain, unspecified: Secondary | ICD-10-CM | POA: Diagnosis present

## 2018-05-04 DIAGNOSIS — Z79891 Long term (current) use of opiate analgesic: Secondary | ICD-10-CM

## 2018-05-04 DIAGNOSIS — G893 Neoplasm related pain (acute) (chronic): Secondary | ICD-10-CM | POA: Diagnosis not present

## 2018-05-04 DIAGNOSIS — Z90710 Acquired absence of both cervix and uterus: Secondary | ICD-10-CM

## 2018-05-04 DIAGNOSIS — Z8679 Personal history of other diseases of the circulatory system: Secondary | ICD-10-CM

## 2018-05-04 DIAGNOSIS — R102 Pelvic and perineal pain: Secondary | ICD-10-CM | POA: Diagnosis not present

## 2018-05-04 DIAGNOSIS — E876 Hypokalemia: Secondary | ICD-10-CM | POA: Diagnosis present

## 2018-05-04 DIAGNOSIS — J449 Chronic obstructive pulmonary disease, unspecified: Secondary | ICD-10-CM | POA: Diagnosis present

## 2018-05-04 DIAGNOSIS — I252 Old myocardial infarction: Secondary | ICD-10-CM

## 2018-05-04 DIAGNOSIS — R109 Unspecified abdominal pain: Secondary | ICD-10-CM | POA: Diagnosis present

## 2018-05-04 DIAGNOSIS — I5022 Chronic systolic (congestive) heart failure: Secondary | ICD-10-CM | POA: Diagnosis present

## 2018-05-04 DIAGNOSIS — E7800 Pure hypercholesterolemia, unspecified: Secondary | ICD-10-CM | POA: Diagnosis present

## 2018-05-04 DIAGNOSIS — N183 Chronic kidney disease, stage 3 unspecified: Secondary | ICD-10-CM

## 2018-05-04 DIAGNOSIS — Z8551 Personal history of malignant neoplasm of bladder: Secondary | ICD-10-CM

## 2018-05-04 DIAGNOSIS — D649 Anemia, unspecified: Secondary | ICD-10-CM

## 2018-05-04 DIAGNOSIS — Z953 Presence of xenogenic heart valve: Secondary | ICD-10-CM

## 2018-05-04 DIAGNOSIS — E039 Hypothyroidism, unspecified: Secondary | ICD-10-CM | POA: Diagnosis present

## 2018-05-04 DIAGNOSIS — Z96641 Presence of right artificial hip joint: Secondary | ICD-10-CM | POA: Diagnosis present

## 2018-05-04 DIAGNOSIS — Z7901 Long term (current) use of anticoagulants: Secondary | ICD-10-CM

## 2018-05-04 DIAGNOSIS — Z888 Allergy status to other drugs, medicaments and biological substances status: Secondary | ICD-10-CM

## 2018-05-04 DIAGNOSIS — I255 Ischemic cardiomyopathy: Secondary | ICD-10-CM | POA: Diagnosis present

## 2018-05-04 DIAGNOSIS — Z8543 Personal history of malignant neoplasm of ovary: Secondary | ICD-10-CM

## 2018-05-04 DIAGNOSIS — Z7982 Long term (current) use of aspirin: Secondary | ICD-10-CM

## 2018-05-04 DIAGNOSIS — Z8249 Family history of ischemic heart disease and other diseases of the circulatory system: Secondary | ICD-10-CM

## 2018-05-04 DIAGNOSIS — Z951 Presence of aortocoronary bypass graft: Secondary | ICD-10-CM

## 2018-05-04 DIAGNOSIS — I480 Paroxysmal atrial fibrillation: Secondary | ICD-10-CM | POA: Diagnosis present

## 2018-05-04 DIAGNOSIS — N131 Hydronephrosis with ureteral stricture, not elsewhere classified: Secondary | ICD-10-CM | POA: Diagnosis present

## 2018-05-04 LAB — BASIC METABOLIC PANEL
ANION GAP: 11 (ref 5–15)
BUN: 13 mg/dL (ref 6–20)
CO2: 26 mmol/L (ref 22–32)
Calcium: 8.8 mg/dL — ABNORMAL LOW (ref 8.9–10.3)
Chloride: 99 mmol/L (ref 98–111)
Creatinine, Ser: 1.03 mg/dL — ABNORMAL HIGH (ref 0.44–1.00)
GFR, EST NON AFRICAN AMERICAN: 59 mL/min — AB (ref >60)
Glucose, Bld: 109 mg/dL — ABNORMAL HIGH (ref 70–99)
POTASSIUM: 2.9 mmol/L — AB (ref 3.5–5.1)
SODIUM: 136 mmol/L (ref 135–145)

## 2018-05-04 LAB — CBC
HCT: 35.3 % — ABNORMAL LOW (ref 36.0–46.0)
Hemoglobin: 11.7 g/dL — ABNORMAL LOW (ref 12.0–15.0)
MCH: 29.9 pg (ref 26.0–34.0)
MCHC: 33.1 g/dL (ref 30.0–36.0)
MCV: 90.3 fL (ref 80.0–100.0)
NRBC: 0 % (ref 0.0–0.2)
PLATELETS: 195 10*3/uL (ref 150–400)
RBC: 3.91 MIL/uL (ref 3.87–5.11)
RDW: 14.9 % (ref 11.5–15.5)
WBC: 14.8 10*3/uL — AB (ref 4.0–10.5)

## 2018-05-04 LAB — URINALYSIS, ROUTINE W REFLEX MICROSCOPIC
Bilirubin Urine: NEGATIVE
GLUCOSE, UA: NEGATIVE mg/dL
Ketones, ur: NEGATIVE mg/dL
Leukocytes, UA: NEGATIVE
Nitrite: NEGATIVE
PH: 6.5 (ref 5.0–8.0)
Protein, ur: NEGATIVE mg/dL
SPECIFIC GRAVITY, URINE: 1.025 (ref 1.005–1.030)

## 2018-05-04 LAB — URINALYSIS, MICROSCOPIC (REFLEX): WBC, UA: NONE SEEN WBC/hpf (ref 0–5)

## 2018-05-04 MED ORDER — IOPAMIDOL (ISOVUE-300) INJECTION 61%
100.0000 mL | Freq: Once | INTRAVENOUS | Status: AC | PRN
  Administered 2018-05-04: 100 mL via INTRAVENOUS

## 2018-05-04 MED ORDER — HYDROMORPHONE HCL 1 MG/ML IJ SOLN
1.0000 mg | Freq: Once | INTRAMUSCULAR | Status: AC
  Administered 2018-05-04: 1 mg via INTRAVENOUS
  Filled 2018-05-04: qty 1

## 2018-05-04 NOTE — ED Notes (Signed)
Patient transported to CT 

## 2018-05-04 NOTE — ED Triage Notes (Signed)
Pt states she has a mass near colon/bladder area-was admitted to hosp for pain management-pain is now not being controlled by po dilaudid-NAD-slow gait

## 2018-05-05 ENCOUNTER — Encounter (HOSPITAL_COMMUNITY): Payer: Self-pay | Admitting: General Practice

## 2018-05-05 DIAGNOSIS — R109 Unspecified abdominal pain: Secondary | ICD-10-CM | POA: Diagnosis not present

## 2018-05-05 DIAGNOSIS — I5022 Chronic systolic (congestive) heart failure: Secondary | ICD-10-CM | POA: Diagnosis not present

## 2018-05-05 DIAGNOSIS — Z823 Family history of stroke: Secondary | ICD-10-CM | POA: Diagnosis not present

## 2018-05-05 DIAGNOSIS — N183 Chronic kidney disease, stage 3 (moderate): Secondary | ICD-10-CM | POA: Diagnosis not present

## 2018-05-05 DIAGNOSIS — E876 Hypokalemia: Secondary | ICD-10-CM | POA: Diagnosis not present

## 2018-05-05 DIAGNOSIS — Z8551 Personal history of malignant neoplasm of bladder: Secondary | ICD-10-CM | POA: Diagnosis not present

## 2018-05-05 DIAGNOSIS — Z79899 Other long term (current) drug therapy: Secondary | ICD-10-CM | POA: Diagnosis not present

## 2018-05-05 DIAGNOSIS — Z888 Allergy status to other drugs, medicaments and biological substances status: Secondary | ICD-10-CM | POA: Diagnosis not present

## 2018-05-05 DIAGNOSIS — E039 Hypothyroidism, unspecified: Secondary | ICD-10-CM | POA: Diagnosis not present

## 2018-05-05 DIAGNOSIS — Z96641 Presence of right artificial hip joint: Secondary | ICD-10-CM | POA: Diagnosis not present

## 2018-05-05 DIAGNOSIS — G893 Neoplasm related pain (acute) (chronic): Secondary | ICD-10-CM | POA: Diagnosis not present

## 2018-05-05 DIAGNOSIS — Z8543 Personal history of malignant neoplasm of ovary: Secondary | ICD-10-CM | POA: Diagnosis not present

## 2018-05-05 DIAGNOSIS — I255 Ischemic cardiomyopathy: Secondary | ICD-10-CM | POA: Diagnosis not present

## 2018-05-05 DIAGNOSIS — N131 Hydronephrosis with ureteral stricture, not elsewhere classified: Secondary | ICD-10-CM | POA: Diagnosis not present

## 2018-05-05 DIAGNOSIS — I252 Old myocardial infarction: Secondary | ICD-10-CM | POA: Diagnosis not present

## 2018-05-05 DIAGNOSIS — Z8249 Family history of ischemic heart disease and other diseases of the circulatory system: Secondary | ICD-10-CM | POA: Diagnosis not present

## 2018-05-05 DIAGNOSIS — Z8679 Personal history of other diseases of the circulatory system: Secondary | ICD-10-CM | POA: Diagnosis not present

## 2018-05-05 DIAGNOSIS — C569 Malignant neoplasm of unspecified ovary: Secondary | ICD-10-CM | POA: Diagnosis not present

## 2018-05-05 DIAGNOSIS — Z951 Presence of aortocoronary bypass graft: Secondary | ICD-10-CM | POA: Diagnosis not present

## 2018-05-05 DIAGNOSIS — I480 Paroxysmal atrial fibrillation: Secondary | ICD-10-CM | POA: Diagnosis not present

## 2018-05-05 DIAGNOSIS — E7800 Pure hypercholesterolemia, unspecified: Secondary | ICD-10-CM | POA: Diagnosis not present

## 2018-05-05 DIAGNOSIS — R102 Pelvic and perineal pain: Secondary | ICD-10-CM | POA: Diagnosis present

## 2018-05-05 DIAGNOSIS — Z90710 Acquired absence of both cervix and uterus: Secondary | ICD-10-CM | POA: Diagnosis not present

## 2018-05-05 DIAGNOSIS — J449 Chronic obstructive pulmonary disease, unspecified: Secondary | ICD-10-CM | POA: Diagnosis not present

## 2018-05-05 DIAGNOSIS — D72829 Elevated white blood cell count, unspecified: Secondary | ICD-10-CM | POA: Diagnosis not present

## 2018-05-05 DIAGNOSIS — I2510 Atherosclerotic heart disease of native coronary artery without angina pectoris: Secondary | ICD-10-CM | POA: Diagnosis not present

## 2018-05-05 LAB — BASIC METABOLIC PANEL
ANION GAP: 11 (ref 5–15)
BUN: 10 mg/dL (ref 6–20)
CO2: 26 mmol/L (ref 22–32)
Calcium: 8.8 mg/dL — ABNORMAL LOW (ref 8.9–10.3)
Chloride: 101 mmol/L (ref 98–111)
Creatinine, Ser: 0.96 mg/dL (ref 0.44–1.00)
Glucose, Bld: 93 mg/dL (ref 70–99)
Potassium: 3.1 mmol/L — ABNORMAL LOW (ref 3.5–5.1)
SODIUM: 138 mmol/L (ref 135–145)

## 2018-05-05 LAB — MAGNESIUM: MAGNESIUM: 1.9 mg/dL (ref 1.7–2.4)

## 2018-05-05 LAB — PROTIME-INR
INR: 1.61
Prothrombin Time: 19 s — ABNORMAL HIGH (ref 11.4–15.2)

## 2018-05-05 MED ORDER — SPIRONOLACTONE 25 MG PO TABS
25.0000 mg | ORAL_TABLET | Freq: Every day | ORAL | Status: DC
  Administered 2018-05-05 – 2018-05-15 (×10): 25 mg via ORAL
  Filled 2018-05-05 (×10): qty 1

## 2018-05-05 MED ORDER — METHOCARBAMOL 500 MG PO TABS
500.0000 mg | ORAL_TABLET | Freq: Four times a day (QID) | ORAL | Status: DC | PRN
  Administered 2018-05-05 – 2018-05-15 (×26): 500 mg via ORAL
  Filled 2018-05-05 (×28): qty 1

## 2018-05-05 MED ORDER — ASPIRIN EC 81 MG PO TBEC
81.0000 mg | DELAYED_RELEASE_TABLET | Freq: Every day | ORAL | Status: DC
  Administered 2018-05-05 – 2018-05-15 (×10): 81 mg via ORAL
  Filled 2018-05-05 (×10): qty 1

## 2018-05-05 MED ORDER — ENOXAPARIN SODIUM 80 MG/0.8ML ~~LOC~~ SOLN
80.0000 mg | Freq: Two times a day (BID) | SUBCUTANEOUS | Status: DC
  Administered 2018-05-05 – 2018-05-07 (×4): 80 mg via SUBCUTANEOUS
  Filled 2018-05-05 (×4): qty 0.8

## 2018-05-05 MED ORDER — METHOCARBAMOL 500 MG PO TABS
500.0000 mg | ORAL_TABLET | Freq: Once | ORAL | Status: AC
  Administered 2018-05-05: 500 mg via ORAL
  Filled 2018-05-05: qty 1

## 2018-05-05 MED ORDER — CARVEDILOL 3.125 MG PO TABS
3.1250 mg | ORAL_TABLET | Freq: Two times a day (BID) | ORAL | Status: DC
  Administered 2018-05-05 – 2018-05-15 (×18): 3.125 mg via ORAL
  Filled 2018-05-05 (×18): qty 1

## 2018-05-05 MED ORDER — ALBUTEROL SULFATE (2.5 MG/3ML) 0.083% IN NEBU: 3.0000 mL | INHALATION_SOLUTION | RESPIRATORY_TRACT | Status: DC | PRN

## 2018-05-05 MED ORDER — HYDROMORPHONE HCL 1 MG/ML IJ SOLN
1.0000 mg | Freq: Once | INTRAMUSCULAR | Status: AC
  Administered 2018-05-05: 1 mg via INTRAVENOUS
  Filled 2018-05-05: qty 1

## 2018-05-05 MED ORDER — LORAZEPAM 0.5 MG PO TABS
0.5000 mg | ORAL_TABLET | Freq: Four times a day (QID) | ORAL | Status: DC | PRN
  Administered 2018-05-06 – 2018-05-14 (×7): 0.5 mg via ORAL
  Filled 2018-05-05 (×8): qty 1

## 2018-05-05 MED ORDER — FUROSEMIDE 20 MG PO TABS
20.0000 mg | ORAL_TABLET | Freq: Every day | ORAL | Status: DC
  Administered 2018-05-05 – 2018-05-14 (×9): 20 mg via ORAL
  Filled 2018-05-05 (×9): qty 1

## 2018-05-05 MED ORDER — ZOLPIDEM TARTRATE 5 MG PO TABS
5.0000 mg | ORAL_TABLET | Freq: Every day | ORAL | Status: DC
  Administered 2018-05-05 – 2018-05-14 (×9): 5 mg via ORAL
  Filled 2018-05-05 (×10): qty 1

## 2018-05-05 MED ORDER — HYDROMORPHONE HCL 4 MG PO TABS: 4.0000 mg | ORAL_TABLET | ORAL | 0 refills | Status: DC | PRN

## 2018-05-05 MED ORDER — SODIUM CHLORIDE 0.9 % IV SOLN
Freq: Once | INTRAVENOUS | Status: AC
  Administered 2018-05-05: 01:00:00 via INTRAVENOUS

## 2018-05-05 MED ORDER — POTASSIUM CHLORIDE CRYS ER 20 MEQ PO TBCR
40.0000 meq | EXTENDED_RELEASE_TABLET | Freq: Once | ORAL | Status: AC
  Administered 2018-05-05: 40 meq via ORAL
  Filled 2018-05-05: qty 2

## 2018-05-05 MED ORDER — ACETAMINOPHEN 500 MG PO TABS
1000.0000 mg | ORAL_TABLET | Freq: Four times a day (QID) | ORAL | Status: DC | PRN
  Administered 2018-05-05 – 2018-05-06 (×3): 1000 mg via ORAL
  Filled 2018-05-05 (×3): qty 2

## 2018-05-05 MED ORDER — WARFARIN - PHARMACIST DOSING INPATIENT: Freq: Every day | Status: DC

## 2018-05-05 MED ORDER — HYDROMORPHONE HCL 1 MG/ML IJ SOLN
1.0000 mg | INTRAMUSCULAR | Status: DC | PRN
  Administered 2018-05-05 – 2018-05-06 (×12): 1 mg via INTRAVENOUS
  Filled 2018-05-05 (×12): qty 1

## 2018-05-05 MED ORDER — WARFARIN SODIUM 5 MG PO TABS
7.5000 mg | ORAL_TABLET | Freq: Once | ORAL | Status: AC
  Administered 2018-05-05: 7.5 mg via ORAL
  Filled 2018-05-05: qty 1

## 2018-05-05 MED ORDER — POLYETHYLENE GLYCOL 3350 17 G PO PACK
17.0000 g | PACK | Freq: Every day | ORAL | Status: DC
  Filled 2018-05-05: qty 1

## 2018-05-05 NOTE — ED Provider Notes (Signed)
[facility] EMERGENCY DEPARTMENT Provider Note   CSN: [id number] Arrival date & time: 05/04/18  2007     History   Chief Complaint Chief Complaint  Patient presents with  . Mass    HPI [name] is a 57 y.o. female.  HPI 57 year old female with a history of ovarian cancer and known new increased size and a pelvic/perirectal mass presents the emergency department with worsening perirectal and perineal and deep pelvic pain.  She was recently hospitalized for acute pain management and underwent CT-guided biopsy 3 days ago.  She presents with worsening pain despite home oral Dilaudid.  She is currently taking 2 mg every 4 hours as needed pain.  She states she has required this all day today and this is not helped her pain.  No chills or fever.  She feels like she is urinating and defecating although she states increasing pain with urination and defecation.  She states she is having a hard time finding a comfortable position at this time.  Pain is severe in severity.   Past Medical History:  Diagnosis Date  . Arthritis    "everywhere"  . Asthma    as a child  . Automatic implantable cardioverter-defibrillator in situ    DR. Beckie Salts   . Bladder cancer [facility]) 2009   "injected medicine to get rid of it"  . Chronic systolic CHF (congestive heart failure) ([place])   . CKD (chronic kidney disease), stage III ([place])   . Coronary artery disease    a. large RV/inferior MI complicated by pseudoaneurysm s/p aneurysemectomy and CABG 10/2009 with papillary muscle rupture requiring bioprosthetic mitral valve replacement in [facility].  . 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 H/O thymectomy   . High cholesterol   . Hypothyroidism   . ICD (implantable cardiac defibrillator) in place   . Ischemic cardiomyopathy    a.  ICM and VT arrest 10/2009 s/p AICD (revision 01/2012 due to RV lead problem).  . Myocardial infarction  ([place]) 10/14/2009  . Neuropathy    FEET AND HANDS - FROM CHEMO - BUT MUCH IMPROVED  . PAF (paroxysmal atrial fibrillation) ([place])   . Pain    JOINT PAINS AND MUSCLE ACHES ALL OVER.  Marland Kitchen Port-A-Cath in place    RIGHT UPPER CHEST  . Prosthetic valve dysfunction   . S/P mitral valve replacement with bioprosthetic valve 10/21/2009   Edwards Perimount stented bovine pericardial tissue valve, size 29 mm  . S/P valve-in-valve transcatheter mitral valve replacement 09/16/2017   29 mm Edwards Sapien 3 transcatheter heart valve placed via transapical approach  . SVT (supraventricular tachycardia) ([place])   . Tobacco abuse   . Ventricular tachycardia [facility])     Patient Active Problem List   Diagnosis Date Noted  . Intractable pain 05/01/2018  . Hypokalemia 05/01/2018  . Encounter for therapeutic drug monitoring 09/22/2017  . S/P valve-in-valve transcatheter mitral valve replacement 09/16/2017  . Prosthetic valve dysfunction   . Stenosis of prosthetic mitral valve   . Dyspnea 06/04/2017  . Abdominal pain   . Pancolitis ([place]) 06/03/2017  . Paroxysmal atrial fibrillation ([place]) 09/02/2016  . COPD (chronic obstructive pulmonary disease) ([place]) 09/02/2016  . Hypoxia 05/28/2015  . Acute bronchitis 05/28/2015  . CKD (chronic kidney disease) stage 3, GFR 30-59 ml/min ([facility]) 05/28/2015  . Chronic anemia 05/28/2015  . Anxiety 03/08/2014  . Granulosa cell tumor of ovary 01/20/2013  . OA (  osteoarthritis) of hip 05/06/2012  . Automatic implantable cardioverter-defibrillator in situ 11/08/2009  . Athscl autologous vein CABG w oth angina pectoris ([place]) 11/07/2009  . VENTRICULAR TACHYCARDIA 11/07/2009  . VENTRICULAR FIBRILLATION 11/07/2009  . Chronic systolic CHF (congestive heart failure) ([place]) 11/07/2009  . S/P mitral valve replacement with bioprosthetic valve 10/21/2009    Past Surgical History:  Procedure Laterality Date  . ABDOMINAL HYSTERECTOMY N/A 07/27/2013   Procedure: TUMOR EXCISION OF ABDOMINAL  MASS;  Surgeon: Alvino Chapel, MD;  Location: [facility] ORS;  Service: Gynecology;  Laterality: N/A;  . APPENDECTOMY  1989  . BILATERAL OOPHORECTOMY  2007  . CARDIAC DEFIBRILLATOR PLACEMENT  02/06/2012   "lead change"  . CARDIAC VALVE REPLACEMENT    . CYSTOSCOPY Right 02/23/2013   Procedure: CYSTOSCOPY WITH STENT REMOVAL;  Surgeon: Alvino Chapel, MD;  Location: [facility];  Service: Gynecology;  Laterality: Right;  . IMPLANTABLE CARDIOVERTER DEFIBRILLATOR REVISION N/A 02/06/2012   Procedure: IMPLANTABLE CARDIOVERTER DEFIBRILLATOR REVISION;  Surgeon: Evans Lance, MD;  Location: [facility] CATH LAB;  Service: Cardiovascular;  Laterality: N/A;  . INSERT / REPLACE / REMOVE PACEMAKER  10/2009   DUAL-CHAMBER [place]. JUDE DEFIBRILLATOR  . LAPAROTOMY N/A 01/19/2013   Procedure: TUMOR DEBULKING / BOWEL RESECTION/INSERTION RIGHT UTERERAL DOUBLE J STENT/OMENTECTOMY;  Surgeon: Alvino Chapel, MD;  Location: [facility];  Service: Gynecology;  Laterality: N/A;  . MITRAL VALVE REPLACEMENT  10/21/2009   90mm Edwards Perimount bovine pericardial tissue valve - [facility]  . RIGHT/LEFT HEART CATH AND CORONARY ANGIOGRAPHY N/A 07/02/2017   Procedure: RIGHT/LEFT HEART CATH AND CORONARY ANGIOGRAPHY;  Surgeon: Belva Crome, MD;  Location: [facility] CV LAB;  Service: Cardiovascular;  Laterality: N/A;  . TEE WITHOUT CARDIOVERSION N/A 09/16/2017   Procedure: TRANSESOPHAGEAL ECHOCARDIOGRAM (TEE);  Surgeon: [name], MD;  Location: [facility];  Service: Open Heart Surgery;  Laterality: N/A;  . THYMECTOMY  2009  . TONSILLECTOMY AND ADENOIDECTOMY  ~ 1967  . TOTAL HIP ARTHROPLASTY  05/06/2012   Procedure: TOTAL HIP ARTHROPLASTY;  Surgeon: Gearlean Alf, MD;  Location: [facility];  Service: Orthopedics;  Laterality: Right;  . TRANSCATHETER MITRAL VALVE REPLACEMENT, TRANSAPICAL N/A 09/16/2017   Procedure: TRANSCATHETER MITRAL VALVE REPLACEMENT,TRANSAPICAL;  Surgeon: [name], MD;  Location: [facility];  Service: Open  Heart Surgery;  Laterality: N/A;  . TRANSURETHRAL RESECTION OF BLADDER  2009   "for bladder cancer"  . TUBAL LIGATION  1993  . VAGINAL HYSTERECTOMY  2001     OB History   None      Home Medications    Prior to Admission medications   Medication Sig Start Date End Date Taking? Authorizing Provider  acetaminophen (TYLENOL) 500 MG tablet Take 2 tablets (1,000 mg total) by mouth every 6 (six) hours as needed for moderate pain or headache. 05/02/18   Geradine Girt, DO  albuterol (PROVENTIL HFA;VENTOLIN HFA) 108 (90 Base) MCG/ACT inhaler Inhale 2 puffs into the lungs every 4 (four) hours as needed for wheezing or shortness of breath. 06/06/17   Eugenie Filler, MD  amoxicillin (AMOXIL) 500 MG tablet Take 4 tablets (2,000 mg total) by mouth as directed. Take one hour prior to dental visits. 10/31/17   [name], MD  aspirin EC 81 MG EC tablet Take 1 tablet (81 mg total) by mouth daily. 09/19/17   Gold, Wilder Glade, PA-C  atorvastatin (LIPITOR) 80 MG tablet Take 1 tablet (80 mg total) by mouth daily at 6 PM. 09/19/17   Gold, Wilder Glade, PA-C  b complex vitamins tablet Take 1 tablet by mouth 2 (two) times daily.     [provider]  carvedilol (COREG) 3.125 MG tablet Take 1 tablet (3.125 mg total) by mouth 2 (two) times daily with a meal. 10/06/17   Nani Skillern, PA-C  cholecalciferol (VITAMIN D-400) 400 UNITS TABS tablet Take 400 Units by mouth 2 (two) times daily.    [provider]  diphenhydrAMINE (BENADRYL) 25 MG tablet Take 25 mg by mouth every 6 (six) hours as needed for itching or allergies.     [provider]  enoxaparin (LOVENOX) 80 MG/0.8ML injection Inject 0.8 mLs (80 mg total) into the skin every 12 (twelve) hours. 04/24/18   Belva Crome, MD  furosemide (LASIX) 20 MG tablet TAKE 1 TAB BY MOUTH DAILY. MAY TAKE AN EXTRA TAB AS NEEDED FOR LEG SWELLING OR SHORTNESS OF BREATH Patient taking differently: Take 20 mg by mouth daily.  10/29/17   Evans Lance, MD  HYDROmorphone (DILAUDID) 4 MG tablet Take 1 tablet (4 mg total) by mouth every 4 (four) hours as needed for severe pain. 05/05/18   Jola Schmidt, MD  LORazepam (ATIVAN) 0.5 MG tablet Take 1 tablet (0.5 mg total) by mouth every 6 (six) hours as needed (for nausea and vomiting). 05/02/18   Geradine Girt, DO  spironolactone (ALDACTONE) 25 MG tablet Take 1 tablet (25 mg total) by mouth daily. 06/11/17 06/06/18  Belva Crome, MD  Vitamin D, Ergocalciferol, (DRISDOL) 50000 units CAPS capsule TAKE 1 CAPSULE (50,000 UNITS TOTAL) BY MOUTH EVERY 7 (SEVEN) DAYS. Patient taking differently: Take 50,000 Units by mouth every Thursday.  10/23/17   Volanda Napoleon, MD  warfarin (COUMADIN) 5 MG tablet Take 1-1.5 tablets (5-7.5 mg total) by mouth See admin instructions. Take 7.5 mg by mouth daily on Monday, Wednesday and Friday. Take 5 mg by mouth daily on all other days. 05/02/18   Geradine Girt, DO  zolpidem (AMBIEN) 5 MG tablet TAKE 1 TABLET BY MOUTH EVERYDAY AT BEDTIME Patient taking differently: Take 5 mg by mouth at bedtime.  02/16/18   Volanda Napoleon, MD    Family History Family History  Problem Relation Age of Onset  . Heart attack Paternal Grandfather   . Heart attack Maternal Grandfather   . Stroke Maternal Grandmother   . Heart attack Brother     Social History Social History   Tobacco Use  . Smoking status: Current Some Day Smoker    Packs/day: 0.50    Years: 30.00    Pack years: 15.00    Types: Cigarettes    Start date: 03/28/1974  . Smokeless tobacco: Never Used  Substance Use Topics  . Alcohol use: No    Alcohol/week: 0.0 standard drinks  . Drug use: No     Allergies   Prochlorperazine edisylate; Adhesive [tape]; and Prochlorperazine   Review of Systems Review of Systems  All other systems reviewed and are negative.    Physical Exam Updated Vital Signs BP (!) 129/59 (BP Location: Right Arm)   Pulse 72   Temp 99 F (37.2 C) (Oral)   Resp 16   Ht 5'  6" (1.676 m)   Wt 89.4 kg   SpO2 100%   BMI 31.80 kg/m   Physical Exam  Constitutional: She is oriented to person, place, and time. She appears well-developed and well-nourished.  Uncomfortable appearing.  Writhing in bed.  HENT:  Head: Normocephalic and atraumatic.  Eyes: EOM are normal.  Neck: Normal range of motion.  Cardiovascular: Normal rate and regular rhythm.  Pulmonary/Chest: Effort normal and breath sounds normal.  Abdominal: Soft. She exhibits no distension. There is no tenderness.  Genitourinary:  Genitourinary Comments: Normal external examination of her rectum and perineal space.  Musculoskeletal: Normal range of motion.  Neurological: She is alert and oriented to person, place, and time.  Skin: Skin is warm and dry.  Psychiatric: She has a normal mood and affect. Judgment normal.  Nursing note and vitals reviewed.    ED Treatments / Results  Labs (all labs ordered are listed, but only abnormal results are displayed) Labs Reviewed  URINALYSIS, ROUTINE W REFLEX MICROSCOPIC - Abnormal; Notable for the following components:      Result Value   Hgb urine dipstick TRACE (*)    All other components within normal limits  URINALYSIS, MICROSCOPIC (REFLEX) - Abnormal; Notable for the following components:   Bacteria, UA RARE (*)    All other components within normal limits  CBC - Abnormal; Notable for the following components:   WBC 14.8 (*)    Hemoglobin 11.7 (*)    HCT 35.3 (*)    All other components within normal limits  BASIC METABOLIC PANEL - Abnormal; Notable for the following components:   Potassium 2.9 (*)    Glucose, Bld 109 (*)    Creatinine, Ser 1.03 (*)    Calcium 8.8 (*)    GFR calc non Af Amer 59 (*)    All other components within normal limits    EKG None  Radiology Ct Abdomen Pelvis W Contrast  Result Date: 05/05/2018 CLINICAL DATA:  Masses in the pelvis.  Pain management. EXAM: CT ABDOMEN AND PELVIS WITH CONTRAST TECHNIQUE: Multidetector  CT imaging of the abdomen and pelvis was performed using the standard protocol following bolus administration of intravenous contrast. CONTRAST:  146mL ISOVUE-300 IOPAMIDOL (ISOVUE-300) INJECTION 61% COMPARISON:  May 01, 2018 FINDINGS: Lower chest: Left effusion and adjacent platelike scar atelectasis is stable. AICD leads terminate in the right ventricle. Cardiomegaly again identified. Small hiatal hernia. No other changes in the lower chest. Hepatobiliary: Suspicion of a tiny nodule in the hepatic dome on series 2, image 14 not seen previously, measuring 5.7 mm. No other liver nodules or masses. Cholelithiasis. The portal vein is patent. Pancreas: Unremarkable. No pancreatic ductal dilatation or surrounding inflammatory changes. Spleen: Normal in size without focal abnormality. Adrenals/Urinary Tract: Adrenal glands are normal. Mild left hydronephrosis and mild left ureterectasis are more prominent in the interval. No hydronephrosis on the right. The bladder is unchanged. Stomach/Bowel: The stomach and small bowel are normal. The sigmoid colon cannot be separated from pelvic masses, similar in the interval. Postoperative changes seen in the sigmoid colon. No colonic obstruction. The remainder of the colon is otherwise unremarkable. The appendix is not well seen consistent with history of previous appendectomy. Vascular/Lymphatic: Atherosclerotic changes are seen in the nonaneurysmal aorta. Reproductive: The patient is status post hysterectomy. Other: The right pericolic nodule measures 15 by 11 mm today versus 14 x 10 mm previously. Multiple pelvic masses are again identified. The mass inferior to the bifurcation measures 4.4 by 2.6 cm today versus 4.7 x 2.6 cm previously. The right adnexal low-density lesion measures 3.8 by 1.9 cm today versus 3.6 x 2.4 cm previously. The mass posterior to the bladder measures 5.5 x 4.5 cm today versus 5.4 x 4.4 cm previously. The presacral mass measures 6.8 by 6.3 cm today  versus 6.7 x 6.4 cm previously. Adjacent pelvic  nodes are similar in the interval. Musculoskeletal: Previous right hip replacement. Degenerative changes in the left hip. No bony metastatic disease. IMPRESSION: 1. The right para colic nodule/mass and the multiple pelvic masses are similar in the interval. Slight differences in measurement could be due to difference in slice selection. Recommend continued attention on follow-up. 2. Mild left hydronephrosis, new in the interval, likely due to compression of the distal left ureter by 1 or more of the pelvic masses. No obstruction on the right. 3. Suspicion of a new nodule in the hepatic dome as above. Recommend attention on follow-up. Alternatively, an MRI could further assess if clinically warranted. 4. Atherosclerotic changes in the nonaneurysmal aorta. 5. Stable left effusion and platelike scarring/atelectasis. Electronically Signed   By: Dorise Bullion III M.D   On: 05/05/2018 00:08    Procedures Procedures (including critical care time)  Medications Ordered in ED Medications  HYDROmorphone (DILAUDID) injection 1 mg (has no administration in time range)  methocarbamol (ROBAXIN) tablet 500 mg (has no administration in time range)  HYDROmorphone (DILAUDID) injection 1 mg (1 mg Intravenous Given 05/04/18 2217)  iopamidol (ISOVUE-300) 61 % injection 100 mL (100 mLs Intravenous Contrast Given 05/04/18 2321)     Initial Impression / Assessment and Plan / ED Course  I have reviewed the triage vital signs and the nursing notes.  Pertinent labs & imaging results that were available during my care of the patient were reviewed by me and considered in my medical decision making (see chart for details).     12:51 AM Patient continues to have severe pain at this time.  Muscle relaxants and additional IV Dilaudid at this time.  Patient is hesitant to be discharged home as she has had no pain control at home with a 2 mg oral Dilaudid tablets.  She would  prefer observational hospitalization for acute pain control.  PRN Dilaudid will be written for here in the emergency department.  I have told patient that at any time if she feels as though she would like to try this at home that she will need to try 4 mg of oral Dilaudid every 4 hours as needed pain.  She understands this but at this time would like admission for pain and symptom control.  She states her symptoms are debilitating.  Biopsy does demonstrate granulosa cell tumor which is her known pelvic tumors.  She does have some mild left-sided hydronephrosis at this time and she may be having ureteral colic as some of her symptoms.  If this persists she may end up benefiting from percutaneous nephrostomy tube on the left which may better control her symptoms as well.  She will need oncology consultation once hospitalized for management plans of the enlarged perirectal mass which is causing her so much pain.  Final Clinical Impressions(s) / ED Diagnoses   Final diagnoses:  None    ED Discharge Orders         Ordered    HYDROmorphone (DILAUDID) 4 MG tablet  Every 4 hours PRN     05/05/18 0049           Jola Schmidt, MD 05/05/18 [id number]

## 2018-05-05 NOTE — H&P (Addendum)
History and Physical    [name] RUE:[id number] DOB: May 03, 1961 DOA: 05/04/2018  PCP: Default, Provider, MD   Patient coming from: Home    Chief Complaint: Perirectal pain  HPI: [name] is a 57 y.o. 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  Patient was recently hospitalized here for acute pain management of same kind and underwent CT-guided biopsy on 05/01/2018.  She was sent home on oral Dilaudid. Patient says oral Dilaudid is not helping her anymore.  She rated the pain as 10/10.She states she required that medication all the time.  She did not report any other  symptoms like fever, chills, chest pain, shortness of breath, upper abdominal pain, melena and hematochezia. Patient seen and examined the bedside on the floor.  Currently she is hemodynamically stable.  ED Course: initiated  pain management    Past Medical History:  Diagnosis Date  . Arthritis    "everywhere"  . Asthma    as a child  . Automatic implantable cardioverter-defibrillator in situ    DR. Beckie Salts   . Bladder cancer [facility]) 2009   "injected medicine to get rid of it"  . Chronic systolic CHF (congestive heart failure) ([place])   . CKD (chronic kidney disease), stage III ([place])   . Coronary artery disease    a. large RV/inferior MI complicated by pseudoaneurysm s/p aneurysemectomy and CABG 10/2009 with papillary muscle rupture requiring bioprosthetic mitral valve replacement in [facility].  . Granulosa cell carcinoma of ovary ([place])    recurrent - surgical resection 2007, 2014 and 2016   . H/O thymectomy   . High cholesterol   . Hypothyroidism   . ICD (implantable cardiac defibrillator) in place   . Ischemic cardiomyopathy    a.  ICM and VT arrest 10/2009 s/p AICD (revision 01/2012 due to RV lead problem).  . Myocardial  infarction ([place]) 10/14/2009  . Neuropathy    FEET AND HANDS - FROM CHEMO - BUT MUCH IMPROVED  . PAF (paroxysmal atrial fibrillation) ([place])   . Pain    JOINT PAINS AND MUSCLE ACHES ALL OVER.  Marland Kitchen Port-A-Cath in place    RIGHT UPPER CHEST  . Prosthetic valve dysfunction   . S/P mitral valve replacement with bioprosthetic valve 10/21/2009   Edwards Perimount stented bovine pericardial tissue valve, size 29 mm  . S/P valve-in-valve transcatheter mitral valve replacement 09/16/2017   29 mm Edwards Sapien 3 transcatheter heart valve placed via transapical approach  . SVT (supraventricular tachycardia) ([place])   . Tobacco abuse   . Ventricular tachycardia [facility])     Past Surgical History:  Procedure Laterality Date  . ABDOMINAL HYSTERECTOMY N/A 07/27/2013   Procedure: TUMOR EXCISION OF ABDOMINAL MASS;  Surgeon: Alvino Chapel, MD;  Location: [facility] ORS;  Service: Gynecology;  Laterality: N/A;  . APPENDECTOMY  1989  . BILATERAL OOPHORECTOMY  2007  . CARDIAC DEFIBRILLATOR PLACEMENT  02/06/2012   "lead change"  . CARDIAC VALVE REPLACEMENT    . CYSTOSCOPY Right 02/23/2013   Procedure: CYSTOSCOPY WITH STENT REMOVAL;  Surgeon: Alvino Chapel, MD;  Location: [facility];  Service: Gynecology;  Laterality: Right;  . IMPLANTABLE CARDIOVERTER DEFIBRILLATOR REVISION N/A 02/06/2012   Procedure: IMPLANTABLE CARDIOVERTER DEFIBRILLATOR REVISION;  Surgeon: Evans Lance, MD;  Location: [facility] CATH LAB;  Service: Cardiovascular;  Laterality: N/A;  . INSERT / REPLACE / REMOVE PACEMAKER  10/2009   DUAL-CHAMBER [facility]. JUDE DEFIBRILLATOR  . LAPAROTOMY N/A 01/19/2013   Procedure: TUMOR DEBULKING / BOWEL RESECTION/INSERTION RIGHT UTERERAL DOUBLE J STENT/OMENTECTOMY;  Surgeon: Alvino Chapel, MD;  Location: [facility];  Service: Gynecology;  Laterality: N/A;  . MITRAL VALVE REPLACEMENT  10/21/2009   38mm Edwards Perimount bovine pericardial tissue valve - [facility]  . RIGHT/LEFT HEART CATH AND CORONARY  ANGIOGRAPHY N/A 07/02/2017   Procedure: RIGHT/LEFT HEART CATH AND CORONARY ANGIOGRAPHY;  Surgeon: Belva Crome, MD;  Location: [place] CV LAB;  Service: Cardiovascular;  Laterality: N/A;  . TEE WITHOUT CARDIOVERSION N/A 09/16/2017   Procedure: TRANSESOPHAGEAL ECHOCARDIOGRAM (TEE);  Surgeon: [name], MD;  Location: [place];  Service: Open Heart Surgery;  Laterality: N/A;  . THYMECTOMY  2009  . TONSILLECTOMY AND ADENOIDECTOMY  ~ 1967  . TOTAL HIP ARTHROPLASTY  05/06/2012   Procedure: TOTAL HIP ARTHROPLASTY;  Surgeon: Gearlean Alf, MD;  Location: [facility];  Service: Orthopedics;  Laterality: Right;  . TRANSCATHETER MITRAL VALVE REPLACEMENT, TRANSAPICAL N/A 09/16/2017   Procedure: TRANSCATHETER MITRAL VALVE REPLACEMENT,TRANSAPICAL;  Surgeon: [name], MD;  Location: [place];  Service: Open Heart Surgery;  Laterality: N/A;  . TRANSURETHRAL RESECTION OF BLADDER  2009   "for bladder cancer"  . TUBAL LIGATION  1993  . VAGINAL HYSTERECTOMY  2001     reports that she has been smoking cigarettes. She started smoking about 44 years ago. She has a 15.00 pack-year smoking history. She has never used smokeless tobacco. She reports that she does not drink alcohol or use drugs.  Allergies  Allergen Reactions  . Prochlorperazine Edisylate Other (See Comments)    Nervous/ flutter/ shakes--compazine  . Adhesive [Tape] Other (See Comments)    Redness/skin peeling Redness/hives from adhesive tape( tolerates latex gloves); tolerates paper tape  . Prochlorperazine Other (See Comments)    Jitters, hyper    Family History  Problem Relation Age of Onset  . Heart attack Paternal Grandfather   . Heart attack Maternal Grandfather   . Stroke Maternal Grandmother   . Heart attack Brother      Prior to Admission medications   Medication Sig Start Date End Date Taking? Authorizing Provider  acetaminophen (TYLENOL) 500 MG tablet Take 2 tablets (1,000 mg total) by mouth every 6 (six) hours as  needed for moderate pain or headache. 05/02/18  Yes Vann, Jessica U, DO  albuterol (PROVENTIL HFA;VENTOLIN HFA) 108 (90 Base) MCG/ACT inhaler Inhale 2 puffs into the lungs every 4 (four) hours as needed for wheezing or shortness of breath. 06/06/17  Yes Eugenie Filler, MD  aspirin EC 81 MG EC tablet Take 1 tablet (81 mg total) by mouth daily. 09/19/17  Yes Gold, Patrick Jupiter E, PA-C  b complex vitamins tablet Take 1 tablet by mouth 2 (two) times daily.    Yes [provider]  carvedilol (COREG) 3.125 MG tablet Take 1 tablet (3.125 mg total) by mouth 2 (two) times daily with a meal. 10/06/17  Yes Lars Pinks M, PA-C  cholecalciferol (VITAMIN D-400) 400 UNITS TABS tablet Take 400 Units by mouth 2 (two) times daily.   Yes [provider]  diphenhydrAMINE (BENADRYL) 25 MG tablet Take 25 mg by mouth every 6 (six) hours as needed for itching or allergies.    Yes [provider]  enoxaparin (LOVENOX) 80 MG/0.8ML injection Inject 0.8 mLs (80 mg total) into the skin every 12 (twelve) hours. 04/24/18  Yes Belva Crome, MD  furosemide (LASIX) 20 MG  tablet TAKE 1 TAB BY MOUTH DAILY. MAY TAKE AN EXTRA TAB AS NEEDED FOR LEG SWELLING OR SHORTNESS OF BREATH Patient taking differently: Take 20 mg by mouth daily.  10/29/17  Yes Evans Lance, MD  HYDROmorphone (DILAUDID) 2 MG tablet Take 2 mg by mouth every 4 (four) hours.   Yes [provider]  LORazepam (ATIVAN) 0.5 MG tablet Take 1 tablet (0.5 mg total) by mouth every 6 (six) hours as needed (for nausea and vomiting). 05/02/18  Yes Geradine Girt, DO  spironolactone (ALDACTONE) 25 MG tablet Take 1 tablet (25 mg total) by mouth daily. 06/11/17 06/06/18 Yes Belva Crome, MD  Vitamin D, Ergocalciferol, (DRISDOL) 50000 units CAPS capsule TAKE 1 CAPSULE (50,000 UNITS TOTAL) BY MOUTH EVERY 7 (SEVEN) DAYS. Patient taking differently: Take 50,000 Units by mouth every Thursday.  10/23/17  Yes Volanda Napoleon, MD  warfarin (COUMADIN) 5  MG tablet Take 1-1.5 tablets (5-7.5 mg total) by mouth See admin instructions. Take 7.5 mg by mouth daily on Monday, Wednesday and Friday. Take 5 mg by mouth daily on all other days. 05/02/18  Yes Vann, Jessica U, DO  zolpidem (AMBIEN) 5 MG tablet TAKE 1 TABLET BY MOUTH EVERYDAY AT BEDTIME Patient taking differently: Take 5 mg by mouth at bedtime.  02/16/18  Yes Volanda Napoleon, MD  amoxicillin (AMOXIL) 500 MG tablet Take 4 tablets (2,000 mg total) by mouth as directed. Take one hour prior to dental visits. Patient not taking: Reported on 05/05/2018 10/31/17   [name], MD  atorvastatin (LIPITOR) 80 MG tablet Take 1 tablet (80 mg total) by mouth daily at 6 PM. Patient not taking: Reported on 05/05/2018 09/19/17   John Giovanni, PA-C  HYDROmorphone (DILAUDID) 4 MG tablet Take 1 tablet (4 mg total) by mouth every 4 (four) hours as needed for severe pain. 05/05/18   Jola Schmidt, MD    Physical Exam: Vitals:   05/04/18 2121 05/04/18 2334 05/05/18 0745 05/05/18 1153  BP: 124/72 (!) 129/59 101/87 123/69  Pulse: 88 72 89 77  Resp: 18 16 18 20   Temp: 97.9 F (36.6 C) 99 F (37.2 C) 98.5 F (36.9 C) 97.7 F (36.5 C)  TempSrc: Oral Oral Oral Oral  SpO2: 98% 100% 100% 99%  Weight:      Height:        Constitutional: NAD, calm, comfortable Vitals:   05/04/18 2121 05/04/18 2334 05/05/18 0745 05/05/18 1153  BP: 124/72 (!) 129/59 101/87 123/69  Pulse: 88 72 89 77  Resp: 18 16 18 20   Temp: 97.9 F (36.6 C) 99 F (37.2 C) 98.5 F (36.9 C) 97.7 F (36.5 C)  TempSrc: Oral Oral Oral Oral  SpO2: 98% 100% 100% 99%  Weight:      Height:       Eyes: PERRL, lids and conjunctivae normal ENMT: Mucous membranes are moist. Posterior pharynx clear of any exudate or lesions.Normal dentition.  Neck: normal, supple, no masses, no thyromegaly Respiratory: clear to auscultation bilaterally, no wheezing, no crackles. Normal respiratory effort. No accessory muscle use.  Cardiovascular: Regular rate  and rhythm, no murmurs / rubs / gallops. No extremity edema. 2+ pedal pulses. No carotid bruits.  Abdomen: no tenderness, no masses palpated. No hepatosplenomegaly. Bowel sounds positive.  Musculoskeletal: no clubbing / cyanosis. No joint deformity upper and lower extremities. Good ROM, no contractures. Normal muscle tone.  Skin: no rashes, lesions, ulcers. No induration Neurologic: CN 2-12 grossly intact. Sensation intact, DTR normal. Strength 5/5 in  all 4.  Psychiatric: Normal judgment and insight. Alert and oriented x 3. Normal mood.   Foley Catheter:None  Labs on Admission: I have personally reviewed following labs and imaging studies  CBC: Recent Labs  Lab 05/01/18 0925 05/01/18 1501 05/02/18 0623 05/04/18 2221  WBC 18.8*  --  16.7* 14.8*  NEUTROABS  --   --  15.6*  --   HGB 12.4 11.6* 11.1* 11.7*  HCT 38.7 34.0* 34.2* 35.3*  MCV 91.7  --  92.4 90.3  PLT 145*  --  111* 580   Basic Metabolic Panel: Recent Labs  Lab 05/01/18 1501 05/02/18 0623 05/04/18 2245  NA 137 135 136  K 3.1* 4.3 2.9*  CL 102 106 99  CO2  --  22 26  GLUCOSE 105* 117* 109*  BUN 12 14 13   CREATININE 1.10* 1.10* 1.03*  CALCIUM  --  8.8* 8.8*  MG  --  1.7  --    GFR: Estimated Creatinine Clearance: 67 mL/min (A) (by C-G formula based on SCr of 1.03 mg/dL (H)). Liver Function Tests: No results for input(s): AST, ALT, ALKPHOS, BILITOT, PROT, ALBUMIN in the last 168 hours. No results for input(s): LIPASE, AMYLASE in the last 168 hours. No results for input(s): AMMONIA in the last 168 hours. Coagulation Profile: Recent Labs  Lab 05/01/18 1028 05/02/18 0623  INR 1.28 1.17   Cardiac Enzymes: No results for input(s): CKTOTAL, CKMB, CKMBINDEX, TROPONINI in the last 168 hours. BNP (last 3 results) No results for input(s): PROBNP in the last 8760 hours. HbA1C: No results for input(s): HGBA1C in the last 72 hours. CBG: No results for input(s): GLUCAP in the last 168 hours. Lipid Profile: No  results for input(s): CHOL, HDL, LDLCALC, TRIG, CHOLHDL, LDLDIRECT in the last 72 hours. Thyroid Function Tests: No results for input(s): TSH, T4TOTAL, FREET4, T3FREE, THYROIDAB in the last 72 hours. Anemia Panel: No results for input(s): VITAMINB12, FOLATE, FERRITIN, TIBC, IRON, RETICCTPCT in the last 72 hours. Urine analysis:    Component Value Date/Time   COLORURINE YELLOW 05/04/2018 2025   APPEARANCEUR CLEAR 05/04/2018 2025   LABSPEC 1.025 05/04/2018 2025   LABSPEC 1.015 02/15/2013 1036   PHURINE 6.5 05/04/2018 2025   GLUCOSEU NEGATIVE 05/04/2018 2025   GLUCOSEU Negative 02/15/2013 1036   HGBUR TRACE (A) 05/04/2018 2025   BILIRUBINUR NEGATIVE 05/04/2018 2025   BILIRUBINUR Negative 02/15/2013 1036   KETONESUR NEGATIVE 05/04/2018 2025   PROTEINUR NEGATIVE 05/04/2018 2025   UROBILINOGEN 0.2 02/15/2013 1036   NITRITE NEGATIVE 05/04/2018 2025   LEUKOCYTESUR NEGATIVE 05/04/2018 2025   LEUKOCYTESUR Moderate 02/15/2013 1036    Radiological Exams on Admission: Ct Abdomen Pelvis W Contrast  Result Date: 05/05/2018 CLINICAL DATA:  Masses in the pelvis.  Pain management. EXAM: CT ABDOMEN AND PELVIS WITH CONTRAST TECHNIQUE: Multidetector CT imaging of the abdomen and pelvis was performed using the standard protocol following bolus administration of intravenous contrast. CONTRAST:  168mL ISOVUE-300 IOPAMIDOL (ISOVUE-300) INJECTION 61% COMPARISON:  May 01, 2018 FINDINGS: Lower chest: Left effusion and adjacent platelike scar atelectasis is stable. AICD leads terminate in the right ventricle. Cardiomegaly again identified. Small hiatal hernia. No other changes in the lower chest. Hepatobiliary: Suspicion of a tiny nodule in the hepatic dome on series 2, image 14 not seen previously, measuring 5.7 mm. No other liver nodules or masses. Cholelithiasis. The portal vein is patent. Pancreas: Unremarkable. No pancreatic ductal dilatation or surrounding inflammatory changes. Spleen: Normal in size  without focal abnormality. Adrenals/Urinary Tract: Adrenal glands are normal.  Mild left hydronephrosis and mild left ureterectasis are more prominent in the interval. No hydronephrosis on the right. The bladder is unchanged. Stomach/Bowel: The stomach and small bowel are normal. The sigmoid colon cannot be separated from pelvic masses, similar in the interval. Postoperative changes seen in the sigmoid colon. No colonic obstruction. The remainder of the colon is otherwise unremarkable. The appendix is not well seen consistent with history of previous appendectomy. Vascular/Lymphatic: Atherosclerotic changes are seen in the nonaneurysmal aorta. Reproductive: The patient is status post hysterectomy. Other: The right pericolic nodule measures 15 by 11 mm today versus 14 x 10 mm previously. Multiple pelvic masses are again identified. The mass inferior to the bifurcation measures 4.4 by 2.6 cm today versus 4.7 x 2.6 cm previously. The right adnexal low-density lesion measures 3.8 by 1.9 cm today versus 3.6 x 2.4 cm previously. The mass posterior to the bladder measures 5.5 x 4.5 cm today versus 5.4 x 4.4 cm previously. The presacral mass measures 6.8 by 6.3 cm today versus 6.7 x 6.4 cm previously. Adjacent pelvic nodes are similar in the interval. Musculoskeletal: Previous right hip replacement. Degenerative changes in the left hip. No bony metastatic disease. IMPRESSION: 1. The right para colic nodule/mass and the multiple pelvic masses are similar in the interval. Slight differences in measurement could be due to difference in slice selection. Recommend continued attention on follow-up. 2. Mild left hydronephrosis, new in the interval, likely due to compression of the distal left ureter by 1 or more of the pelvic masses. No obstruction on the right. 3. Suspicion of a new nodule in the hepatic dome as above. Recommend attention on follow-up. Alternatively, an MRI could further assess if clinically warranted. 4.  Atherosclerotic changes in the nonaneurysmal aorta. 5. Stable left effusion and platelike scarring/atelectasis. Electronically Signed   By: Dorise Bullion III M.D   On: 05/05/2018 00:08     Assessment/Plan Active Problems:   Abdominal pain  Intractable pain: She has a chronic pain in the coccygeal region related to her malignancy.  She presented emergency department [facility] night for intractable and worsening pain. She was on oral Dilaudid at home which has not been helping her.  We will continue pain management with IV Dilaudid.  Granulosa cell tumor: Just had biopsy done 3 days ago on 05/01/18.  Biopsy shows grams of granulosa cell tumor.  Her malignancy  looks advanced and likely metastasized. will notify Dr. Marin Olp. CT scan done last night showed: right para colic nodule/mass and the multiple pelvic masses are similar in the interval. Mild left hydronephrosis, new in the interval, likely due to compression of the distal left ureter by 1 or more of the pelvic masses. No obstruction on the right.Suspicion of a new nodule in the hepatic dome as above  Hypokalemia: Severe.  Will supplement with potassium.  Check magnesium level.  Leukocytosis: Most likely reactive.  Low suspicion for infectious etiology.  History of chronic systolic heart failure: On diuretics at home which we will continue.  She is not in Acute heart failure.  Echocardiogram in May showed ejection fraction of 45%.  She has history of ventricular tachycardia with ICD in place.  Status post mitral valve repair: Initially she had bioprosthetic mitral valve after she developed MI which caused postinfarction papillary muscle rupture in 2011.  She underwent urgent mitral valve replacement with bioprosthetic tissue valve.  Due to problem with this replaced valve  she has undergone transcatheter mitral valve in valve replacement .  She follows with cardiology. Plan was to continue aspirin and warfarin for the 6  months and continuing aspirin after that. Currently her INR is subtherapeutic. Warfarin was stopped due to recent biopsy.  He has resumed warfarin but the INR is subtherapeutic .We will resume warfarin as per pharmacy consult.Will also bridge her with lovenox until INR reaches 2-3.  History of proximal A. fib: Patient denies this diagnosis.  Currently normal sinus rhythm.  On warfarin  Mild hydronephrosis: As per the CT scan described above Currently we can continue to monitor this.  I do not think she needs urgent urology evaluation.  CKD stage III: Currently kidney function on baseline.  COPD: Currently stable.  No wheezing or shortness of breath at present.  Continue with  Severity of Illness: The appropriate patient status for this patient is OBSERVATION.     DVT prophylaxis: Warfarin Code Status: Full Family Communication: None Consults called: Paged Dr. Marin Olp.Waiting for call back     Shelly Coss MD [facility] Pager [phone]  If 7PM-7AM, please contact night-coverage [email or web address] Password [facility]  05/05/2018, 12:22 PM

## 2018-05-05 NOTE — Progress Notes (Signed)
ANTICOAGULATION CONSULT NOTE - Initial Consult  Pharmacy Consult for LMWH and heparin Indication: MVR bridge therapy  Allergies  Allergen Reactions  . Prochlorperazine Edisylate Other (See Comments)    Nervous/ flutter/ shakes--compazine  . Adhesive [Tape] Other (See Comments)    Redness/skin peeling Redness/hives from adhesive tape( tolerates latex gloves); tolerates paper tape  . Prochlorperazine Other (See Comments)    Jitters, hyper    Patient Measurements: Height: 5\' 6"  (167.6 cm) Weight: 197 lb (89.4 kg) IBW/kg (Calculated) : 59.3 Heparin Dosing Weight: 78.7 kg  Vital Signs: Temp: 97.7 F (36.5 C) (11/12 1153) Temp Source: Oral (11/12 1153) BP: 123/69 (11/12 1153) Pulse Rate: 77 (11/12 1153)  Labs: Recent Labs    05/04/18 2221 05/04/18 2245 05/05/18 1250  HGB 11.7*  --   --   HCT 35.3*  --   --   PLT 195  --   --   LABPROT  --   --  19.0*  INR  --   --  1.61  CREATININE  --  1.03* 0.96    Estimated Creatinine Clearance: 71.9 mL/min (by C-G formula based on SCr of 0.96 mg/dL).   Medical History: Past Medical History:  Diagnosis Date  . Arthritis    "everywhere"  . Asthma    as a child  . Automatic implantable cardioverter-defibrillator in situ    DR. Beckie Salts   . Bladder cancer [facility]) 2009   "injected medicine to get rid of it"  . Chronic systolic CHF (congestive heart failure) ([place])   . CKD (chronic kidney disease), stage III ([place])   . Coronary artery disease    a. large RV/inferior MI complicated by pseudoaneurysm s/p aneurysemectomy and CABG 10/2009 with papillary muscle rupture requiring bioprosthetic mitral valve replacement in [facility].  . Granulosa cell carcinoma of ovary ([place])    recurrent - surgical resection 2007, 2014 and 2016   . H/O thymectomy   . High cholesterol   . Hypothyroidism   . ICD (implantable cardiac defibrillator) in place   . Ischemic cardiomyopathy    a.  ICM and VT arrest 10/2009 s/p AICD (revision 01/2012 due to RV  lead problem).  . Myocardial infarction ([place] [address]) 10/14/2009  . Neuropathy    FEET AND HANDS - FROM CHEMO - BUT MUCH IMPROVED  . PAF (paroxysmal atrial fibrillation) ([place])   . Pain    JOINT PAINS AND MUSCLE ACHES ALL OVER.  Marland Kitchen Port-A-Cath in place    RIGHT UPPER CHEST  . Prosthetic valve dysfunction   . S/P mitral valve replacement with bioprosthetic valve 10/21/2009   Edwards Perimount stented bovine pericardial tissue valve, size 29 mm  . S/P valve-in-valve transcatheter mitral valve replacement 09/16/2017   29 mm Edwards Sapien 3 transcatheter heart valve placed via transapical approach  . SVT (supraventricular tachycardia) ([place])   . Tobacco abuse   . Ventricular tachycardia ([facility])     Medications:  Medications Prior to Admission  Medication Sig Dispense Refill Last Dose  . acetaminophen (TYLENOL) 500 MG tablet Take 2 tablets (1,000 mg total) by mouth every 6 (six) hours as needed for moderate pain or headache.   Past Month at Unknown time  . albuterol (PROVENTIL HFA;VENTOLIN HFA) 108 (90 Base) MCG/ACT inhaler Inhale 2 puffs into the lungs every 4 (four) hours as needed for wheezing or shortness of breath. 1 Inhaler 1 05/04/2018 at Unknown time  . aspirin EC 81 MG EC tablet Take 1 tablet (81 mg total) by mouth daily.   05/04/2018  at Unknown time  . b complex vitamins tablet Take 1 tablet by mouth 2 (two) times daily.    05/04/2018 at Unknown time  . carvedilol (COREG) 3.125 MG tablet Take 1 tablet (3.125 mg total) by mouth 2 (two) times daily with a meal.   05/04/2018 at 530 pm  . cholecalciferol (VITAMIN D-400) 400 UNITS TABS tablet Take 400 Units by mouth 2 (two) times daily.   05/04/2018 at Unknown time  . diphenhydrAMINE (BENADRYL) 25 MG tablet Take 25 mg by mouth every 6 (six) hours as needed for itching or allergies.    Past Week at Unknown time  . enoxaparin (LOVENOX) 80 MG/0.8ML injection Inject 0.8 mLs (80 mg total) into the skin every 12 (twelve) hours. 15 Syringe 1 05/04/2018 at  900 pm  . furosemide (LASIX) 20 MG tablet TAKE 1 TAB BY MOUTH DAILY. MAY TAKE AN EXTRA TAB AS NEEDED FOR LEG SWELLING OR SHORTNESS OF BREATH (Patient taking differently: Take 20 mg by mouth daily. ) 135 tablet 2 05/04/2018 at Unknown time  . HYDROmorphone (DILAUDID) 2 MG tablet Take 2 mg by mouth every 4 (four) hours.   05/04/2018 at Unknown time  . LORazepam (ATIVAN) 0.5 MG tablet Take 1 tablet (0.5 mg total) by mouth every 6 (six) hours as needed (for nausea and vomiting).   05/04/2018 at Unknown time  . spironolactone (ALDACTONE) 25 MG tablet Take 1 tablet (25 mg total) by mouth daily. 90 tablet 3 05/04/2018 at Unknown time  . Vitamin D, Ergocalciferol, (DRISDOL) 50000 units CAPS capsule TAKE 1 CAPSULE (50,000 UNITS TOTAL) BY MOUTH EVERY 7 (SEVEN) DAYS. (Patient taking differently: Take 50,000 Units by mouth every Thursday. ) 24 capsule 1 Past Week at Unknown time  . warfarin (COUMADIN) 5 MG tablet Take 1-1.5 tablets (5-7.5 mg total) by mouth See admin instructions. Take 7.5 mg by mouth daily on Monday, Wednesday and Friday. Take 5 mg by mouth daily on all other days.   05/04/2018 at 600 pm  . zolpidem (AMBIEN) 5 MG tablet TAKE 1 TABLET BY MOUTH EVERYDAY AT BEDTIME (Patient taking differently: Take 5 mg by mouth at bedtime. ) 30 tablet 2 05/04/2018 at Unknown time    Assessment: 57 yo F on coumadin/LMWH bridge PTA for transcatheter mitral valve in valve replacement and PAF.   Home dose is 5 mg qd X 7.5 MWF, on LMWH 80 q12 ended 11/11. Last dose warf 11/11 at 1800 INR 1.61 today which is below coumadin clinic goal of 2-3.  Hg 11.7, PLTC 195. Wt 78.7 11/11 > 89.4 11/12.  Goal of Therapy:  INR 2-3 Monitor platelets by anticoagulation protocol: Yes   Plan:  LMWH 80 mg sq q12h Coumadin 7.5 mg po x 1 dose today Daily INR LMWH until INR > 2  Eudelia Bunch, Pharm.D [phone] 05/05/2018 1:37 PM

## 2018-05-05 NOTE — Progress Notes (Signed)
RT attempted IV access X 2 unsuccessfully.

## 2018-05-06 ENCOUNTER — Encounter (HOSPITAL_COMMUNITY): Payer: Self-pay | Admitting: *Deleted

## 2018-05-06 ENCOUNTER — Ambulatory Visit
Admit: 2018-05-06 | Discharge: 2018-05-06 | Disposition: A | Discharge status: Home / Self Care | Payer: BLUE CROSS/BLUE SHIELD | Source: Ambulatory Visit | Attending: Radiation Oncology | Admitting: Radiation Oncology

## 2018-05-06 DIAGNOSIS — Z8679 Personal history of other diseases of the circulatory system: Secondary | ICD-10-CM | POA: Diagnosis not present

## 2018-05-06 DIAGNOSIS — N131 Hydronephrosis with ureteral stricture, not elsewhere classified: Secondary | ICD-10-CM | POA: Diagnosis present

## 2018-05-06 DIAGNOSIS — D72829 Elevated white blood cell count, unspecified: Secondary | ICD-10-CM | POA: Diagnosis present

## 2018-05-06 DIAGNOSIS — I5022 Chronic systolic (congestive) heart failure: Secondary | ICD-10-CM | POA: Diagnosis present

## 2018-05-06 DIAGNOSIS — D3911 Neoplasm of uncertain behavior of right ovary: Secondary | ICD-10-CM | POA: Diagnosis present

## 2018-05-06 DIAGNOSIS — Z79899 Other long term (current) drug therapy: Secondary | ICD-10-CM | POA: Diagnosis not present

## 2018-05-06 DIAGNOSIS — Z8551 Personal history of malignant neoplasm of bladder: Secondary | ICD-10-CM | POA: Diagnosis not present

## 2018-05-06 DIAGNOSIS — E7800 Pure hypercholesterolemia, unspecified: Secondary | ICD-10-CM | POA: Diagnosis present

## 2018-05-06 DIAGNOSIS — Z8543 Personal history of malignant neoplasm of ovary: Secondary | ICD-10-CM | POA: Diagnosis not present

## 2018-05-06 DIAGNOSIS — R102 Pelvic and perineal pain: Secondary | ICD-10-CM | POA: Diagnosis present

## 2018-05-06 DIAGNOSIS — D3910 Neoplasm of uncertain behavior of unspecified ovary: Secondary | ICD-10-CM

## 2018-05-06 DIAGNOSIS — Z8249 Family history of ischemic heart disease and other diseases of the circulatory system: Secondary | ICD-10-CM | POA: Diagnosis not present

## 2018-05-06 DIAGNOSIS — N183 Chronic kidney disease, stage 3 (moderate): Secondary | ICD-10-CM | POA: Diagnosis present

## 2018-05-06 DIAGNOSIS — I2510 Atherosclerotic heart disease of native coronary artery without angina pectoris: Secondary | ICD-10-CM | POA: Diagnosis present

## 2018-05-06 DIAGNOSIS — R1084 Generalized abdominal pain: Secondary | ICD-10-CM | POA: Diagnosis not present

## 2018-05-06 DIAGNOSIS — E876 Hypokalemia: Secondary | ICD-10-CM | POA: Diagnosis present

## 2018-05-06 DIAGNOSIS — D3611 Benign neoplasm of peripheral nerves and autonomic nervous system of face, head, and neck: Secondary | ICD-10-CM | POA: Diagnosis not present

## 2018-05-06 DIAGNOSIS — R1030 Lower abdominal pain, unspecified: Secondary | ICD-10-CM | POA: Diagnosis not present

## 2018-05-06 DIAGNOSIS — Z888 Allergy status to other drugs, medicaments and biological substances status: Secondary | ICD-10-CM | POA: Diagnosis not present

## 2018-05-06 DIAGNOSIS — G893 Neoplasm related pain (acute) (chronic): Secondary | ICD-10-CM | POA: Diagnosis present

## 2018-05-06 DIAGNOSIS — I255 Ischemic cardiomyopathy: Secondary | ICD-10-CM | POA: Diagnosis present

## 2018-05-06 DIAGNOSIS — F17210 Nicotine dependence, cigarettes, uncomplicated: Secondary | ICD-10-CM | POA: Insufficient documentation

## 2018-05-06 DIAGNOSIS — Z823 Family history of stroke: Secondary | ICD-10-CM | POA: Diagnosis not present

## 2018-05-06 DIAGNOSIS — E039 Hypothyroidism, unspecified: Secondary | ICD-10-CM | POA: Diagnosis present

## 2018-05-06 DIAGNOSIS — J449 Chronic obstructive pulmonary disease, unspecified: Secondary | ICD-10-CM | POA: Diagnosis present

## 2018-05-06 DIAGNOSIS — Z96641 Presence of right artificial hip joint: Secondary | ICD-10-CM | POA: Diagnosis present

## 2018-05-06 DIAGNOSIS — I252 Old myocardial infarction: Secondary | ICD-10-CM | POA: Diagnosis not present

## 2018-05-06 DIAGNOSIS — R52 Pain, unspecified: Secondary | ICD-10-CM

## 2018-05-06 DIAGNOSIS — C569 Malignant neoplasm of unspecified ovary: Secondary | ICD-10-CM | POA: Diagnosis present

## 2018-05-06 DIAGNOSIS — Z90710 Acquired absence of both cervix and uterus: Secondary | ICD-10-CM | POA: Diagnosis not present

## 2018-05-06 DIAGNOSIS — Z951 Presence of aortocoronary bypass graft: Secondary | ICD-10-CM | POA: Diagnosis not present

## 2018-05-06 DIAGNOSIS — I480 Paroxysmal atrial fibrillation: Secondary | ICD-10-CM | POA: Diagnosis present

## 2018-05-06 LAB — BASIC METABOLIC PANEL
ANION GAP: 9 (ref 5–15)
BUN: 10 mg/dL (ref 6–20)
CALCIUM: 8.8 mg/dL — AB (ref 8.9–10.3)
CO2: 27 mmol/L (ref 22–32)
CREATININE: 0.92 mg/dL (ref 0.44–1.00)
Chloride: 101 mmol/L (ref 98–111)
Glucose, Bld: 104 mg/dL — ABNORMAL HIGH (ref 70–99)
Potassium: 3.6 mmol/L (ref 3.5–5.1)
Sodium: 137 mmol/L (ref 135–145)

## 2018-05-06 LAB — CBC
HCT: 36.2 % (ref 36.0–46.0)
Hemoglobin: 11.5 g/dL — ABNORMAL LOW (ref 12.0–15.0)
MCH: 29.5 pg (ref 26.0–34.0)
MCHC: 31.8 g/dL (ref 30.0–36.0)
MCV: 92.8 fL (ref 80.0–100.0)
NRBC: 0 % (ref 0.0–0.2)
PLATELETS: 194 10*3/uL (ref 150–400)
RBC: 3.9 MIL/uL (ref 3.87–5.11)
RDW: 15 % (ref 11.5–15.5)
WBC: 10.1 10*3/uL (ref 4.0–10.5)

## 2018-05-06 LAB — PROTIME-INR
INR: 2.05
PROTHROMBIN TIME: 22.9 s — AB (ref 11.4–15.2)

## 2018-05-06 MED ORDER — LIDOCAINE VISCOUS HCL 2 % MT SOLN
15.0000 mL | OROMUCOSAL | Status: DC | PRN
  Administered 2018-05-06: 15 mL via OROMUCOSAL
  Filled 2018-05-06 (×2): qty 15

## 2018-05-06 MED ORDER — ALUM & MAG HYDROXIDE-SIMETH 200-200-20 MG/5ML PO SUSP
30.0000 mL | ORAL | Status: DC | PRN
  Administered 2018-05-13: 30 mL via ORAL
  Filled 2018-05-06 (×2): qty 30

## 2018-05-06 MED ORDER — WARFARIN SODIUM 5 MG PO TABS
5.0000 mg | ORAL_TABLET | Freq: Once | ORAL | Status: AC
  Administered 2018-05-06: 5 mg via ORAL
  Filled 2018-05-06: qty 1

## 2018-05-06 MED ORDER — HYDROMORPHONE HCL 1 MG/ML IJ SOLN
3.0000 mg | INTRAMUSCULAR | Status: DC | PRN
  Administered 2018-05-06 (×3): 3 mg via INTRAVENOUS
  Filled 2018-05-06 (×3): qty 3

## 2018-05-06 MED ORDER — OXYCODONE HCL ER 15 MG PO T12A
15.0000 mg | EXTENDED_RELEASE_TABLET | Freq: Two times a day (BID) | ORAL | Status: DC
  Administered 2018-05-06 – 2018-05-07 (×4): 15 mg via ORAL
  Filled 2018-05-06 (×4): qty 1

## 2018-05-06 MED ORDER — HYDROMORPHONE HCL 1 MG/ML IJ SOLN
2.0000 mg | INTRAMUSCULAR | Status: DC | PRN
  Administered 2018-05-06 – 2018-05-15 (×39): 2 mg via INTRAVENOUS
  Filled 2018-05-06 (×40): qty 2

## 2018-05-06 MED ORDER — GABAPENTIN 300 MG PO CAPS
300.0000 mg | ORAL_CAPSULE | Freq: Three times a day (TID) | ORAL | Status: DC
  Administered 2018-05-06 – 2018-05-15 (×28): 300 mg via ORAL
  Filled 2018-05-06 (×28): qty 1

## 2018-05-06 MED ORDER — POLYETHYLENE GLYCOL 3350 17 G PO PACK
17.0000 g | PACK | Freq: Two times a day (BID) | ORAL | Status: DC
  Administered 2018-05-06 – 2018-05-13 (×10): 17 g via ORAL
  Filled 2018-05-06 (×15): qty 1

## 2018-05-06 NOTE — Progress Notes (Signed)
PROGRESS NOTE                                                                                                                                                                                                             Patient Demographics:    [name], is a 57 y.o. female, DOB - 11-12-1960, DPO:[id number]  Admit date - 05/04/2018   Admitting Physician Rise Patience, MD  Outpatient Primary MD for the patient is Default, Provider, MD  LOS - 0  Outpatient Specialists: Dr Mosetta Anis  Chief Complaint  Patient presents with  . 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  She had a radical debulking along with rectosigmoid resection with low rectal anastomosis, right distal ureter resection, ureteroneocystostom and omentectomy in 2014 followed by excellent chemotherapy.  She had exploratory laparotomy with radical resection of recurrent tumor in 2017.  Patient now following with Dr. Marin Olp and presenting with severe persistent abdominal pain.  Patient had a recent retroperitoneal soft tissue biopsy done on 11/8 showing granulosa cell tumor and CT of the abdomen showing multiple pelvic masses and mild left hydronephrosis with suspicious new nodule in the hepatic dome. Patient admitted for adequate pain control.    Subjective:   Patient complained of ongoing pain requesting for a PCA.  Dilaudid dose increased to 3 mg every 2 hours by her oncologist this morning.   Assessment  & Plan :    Principal Problem:   Intractable abdominal pain Secondary to recurrence of her granulosa cell tumor with multiple pelvic masses, mild left hydronephrosis and possible new hepatic lesion. Was receiving 1 mg IV Dilaudid every 2 hours, increased to 3 mg by her oncologist this morning.  Also added  OxyContin 15 mg twice daily.  (Patient was taking 1 mg Dilaudid p.o. every 4 hours at home). Added bowel regimen. GYN oncology consulted.  They discussed with patient's surgeon (Dr. Aldean Ast) at [facility] who felt given the extent and location of her disease surgery was not an option.  Recommended options of radiation oncology consult for external beam versus Taxotere and Avastin or Lupron injection due to strong progesterone status. Radiation oncology consulted by GYN-onc.  Active Problems: Recurrence of granulosa  cell tumor. Plan as above.  Oncology and GYN-onc on board.  History of chronic CHF Last echo in May 2019 with EF of 45%.  Also has history of V. tach status post ICD.  Currently euvolemic.  Resume home diuretic.  History of mitral valve repair Following MI with postinfarction papillary muscle rupture in 2011.  On Coumadin with subtherapeutic INR (was held for recent biopsy).  Coumadin resumed with Lovenox for bridging.  Mild left-sided hydronephrosis As seen on CT.  Monitor renal function.  Voiding without difficulty.  Chronic kidney disease stage III Stable at baseline  COPD Stable.  Continue home meds.  Hypokalemia Replenished  ?  Paroxysmal A. fib Patient denies history of it.  On Coumadin for mitral valve repair.     Severity of illness The appropriate status for this patient is inpatient.  Patient needs to be monitored for >2 midnight for adequate pain control with high-dose IV narcotics with pain uncontrolled on oral regimen.    Code Status : Full code  Family Communication  : None at bedside  Disposition Plan  : Home once pain adequately controlled and radiation oncology evaluation.  Barriers For Discharge : Active symptoms  Consults  : Oncology, GYN-onc  Procedures  : CT abdomen and pelvis DVT prophylaxis: Coumadin with Lovenox  Lab Results  Component Value Date   PLT 194 05/06/2018    Antibiotics  :   Anti-infectives (From admission,  onward)   None        Objective:   Vitals:   05/05/18 1153 05/05/18 2007 05/06/18 0434 05/06/18 0749  BP: 123/69 (!) 96/55 109/73 101/74  Pulse: 77 77 76 74  Resp: 20 20 20    Temp: 97.7 F (36.5 C) 98.4 F (36.9 C) 97.7 F (36.5 C)   TempSrc: Oral     SpO2: 99% 98% 96%   Weight:      Height:        Wt Readings from Last 3 Encounters:  05/04/18 89.4 kg  05/01/18 89.4 kg  05/01/18 89.4 kg     Intake/Output Summary (Last 24 hours) at 05/06/2018 1347 Last data filed at 05/06/2018 1102 Gross per 24 hour  Intake 360 ml  Output 250 ml  Net 110 ml     Physical Exam  Gen: not in distress HEENT:  moist mucosa, supple neck Chest: clear b/l, no added sounds CVS: N S1&S2, no murmurs,  GI: soft, nondistended, diffuse abdominal tenderness, bowel sounds present Musculoskeletal: warm, no edema     Data Review:    CBC Recent Labs  Lab 05/01/18 0925 05/01/18 1501 05/02/18 0623 05/04/18 2221 05/06/18 0539  WBC 18.8*  --  16.7* 14.8* 10.1  HGB 12.4 11.6* 11.1* 11.7* 11.5*  HCT 38.7 34.0* 34.2* 35.3* 36.2  PLT 145*  --  111* 195 194  MCV 91.7  --  92.4 90.3 92.8  MCH 29.4  --  30.0 29.9 29.5  MCHC 32.0  --  32.5 33.1 31.8  RDW 15.1  --  15.1 14.9 15.0  LYMPHSABS  --   --  0.7  --   --   MONOABS  --   --  0.3  --   --   EOSABS  --   --  0.0  --   --   BASOSABS  --   --  0.0  --   --     Chemistries  Recent Labs  Lab 05/01/18 1501 05/02/18 0623 05/04/18 2245 05/05/18 1250 05/06/18 0539  NA 137  135 136 138 137  K 3.1* 4.3 2.9* 3.1* 3.6  CL 102 106 99 101 101  CO2  --  22 26 26 27   GLUCOSE 105* 117* 109* 93 104*  BUN 12 14 13 10 10   CREATININE 1.10* 1.10* 1.03* 0.96 0.92  CALCIUM  --  8.8* 8.8* 8.8* 8.8*  MG  --  1.7  --  1.9  --    ------------------------------------------------------------------------------------------------------------------ No results for input(s): CHOL, HDL, LDLCALC, TRIG, CHOLHDL, LDLDIRECT in the last 72 hours.  Lab Results   Component Value Date   HGBA1C 5.0 09/12/2017   ------------------------------------------------------------------------------------------------------------------ No results for input(s): TSH, T4TOTAL, T3FREE, THYROIDAB in the last 72 hours.  Invalid input(s): FREET3 ------------------------------------------------------------------------------------------------------------------ No results for input(s): VITAMINB12, FOLATE, FERRITIN, TIBC, IRON, RETICCTPCT in the last 72 hours.  Coagulation profile Recent Labs  Lab 05/01/18 1028 05/02/18 0623 05/05/18 1250 05/06/18 0539  INR 1.28 1.17 1.61 2.05    No results for input(s): DDIMER in the last 72 hours.  Cardiac Enzymes No results for input(s): CKMB, TROPONINI, MYOGLOBIN in the last 168 hours.  Invalid input(s): CK ------------------------------------------------------------------------------------------------------------------    Component Value Date/Time   BNP 327.3 (H) 09/12/2017 6644    Inpatient Medications  Scheduled Meds: . aspirin EC  81 mg Oral Daily  . carvedilol  3.125 mg Oral BID WC  . enoxaparin (LOVENOX) injection  80 mg Subcutaneous Q12H  . furosemide  20 mg Oral Daily  . gabapentin  300 mg Oral TID  . oxyCODONE  15 mg Oral Q12H  . polyethylene glycol  17 g Oral BID  . spironolactone  25 mg Oral Daily  . warfarin  5 mg Oral ONCE-1800  . Warfarin - Pharmacist Dosing Inpatient   Does not apply q1800  . zolpidem  5 mg Oral QHS   Continuous Infusions: PRN Meds:.acetaminophen, albuterol, HYDROmorphone (DILAUDID) injection, lidocaine, LORazepam, methocarbamol  Micro Results Recent Results (from the past 240 hour(s))  Culture, Urine     Status: Abnormal   Collection Time: 04/30/18  2:32 PM  Result Value Ref Range Status   Specimen Description   Final    URINE, CLEAN CATCH Performed at [facility] at [facility], [address] [place], [place] [address]    Special Requests    Final    URINE, CLEAN CATCH Performed at [facility] at [facility], [address], [place], [name] [address]    Culture (A)  Final    <10,000 COLONIES/mL INSIGNIFICANT GROWTH Performed at [facility], [place] [address], [place], [place] [address]    Report Status 05/01/2018 FINAL  Final    Radiology Reports Ct Chest W Contrast  Result Date: 04/08/2018 CLINICAL DATA:  Granulosa cell tumor of the ovary. Elevated tumor markers. EXAM: CT CHEST, ABDOMEN, AND PELVIS WITH CONTRAST TECHNIQUE: Multidetector CT imaging of the chest, abdomen and pelvis was performed following the standard protocol during bolus administration of intravenous contrast. CONTRAST:  53mL ISOVUE-300 IOPAMIDOL (ISOVUE-300) INJECTION 61% COMPARISON:  CTA chest/abdomen/pelvis 08/26/2017. FINDINGS: CT CHEST FINDINGS Cardiovascular: The heart size is normal. No substantial pericardial effusion. Atherosclerotic calcification is noted in the wall of the thoracic aorta. Status post mitral valve replacement. Left-sided permanent pacemaker/AICD evident. Mediastinum/Nodes: No mediastinal lymphadenopathy. 15 mm short axis paratracheal lymph node measured on the prior study has decreased in the interval now measuring 7 mm. There is no hilar lymphadenopathy. The esophagus has normal imaging features. There is no axillary lymphadenopathy.  Lungs/Pleura: The central tracheobronchial airways are patent. Centrilobular emphysema noted. 4 mm right perifissural nodule (71/3) is stable. 10 mm ground-glass nodule identified posterior right upper lobe on prior study is less confluent today and measures 7 mm. 4 mm ground-glass nodule identified in the left apex with additional peripheral ground-glass nodules. 6 mm left upper lobe nodule (42/3) is similar to prior. Interval development of subpleural plaque-like airspace opacity associated with volume loss. Evolving tiny pleural effusion versus pleural thickening evident.  Musculoskeletal: No worrisome lytic or sclerotic osseous abnormality. Degenerative changes left shoulder with intra-articular loose bodies evident. Sclerotic lesion in the right T5 transverse process is stable. CT ABDOMEN PELVIS FINDINGS Hepatobiliary: No focal abnormality within the liver parenchyma. Possible tiny dependent gallstones (62/2). No intrahepatic or extrahepatic biliary dilation. Pancreas: No focal mass lesion. No dilatation of the main duct. No intraparenchymal cyst. No peripancreatic edema. Spleen: No splenomegaly. No focal mass lesion. Adrenals/Urinary Tract: No adrenal nodule or mass. Kidneys unremarkable No evidence for hydroureter. The urinary bladder appears normal for the degree of distention. Stomach/Bowel: Tiny hiatal hernia. Stomach is nondistended. No gastric wall thickening. No evidence of outlet obstruction. Duodenum is normally positioned as is the ligament of Treitz. No small bowel wall thickening. No small bowel dilatation. The terminal ileum is normal. The appendix is not visualized, but there is no edema or inflammation in the region of the cecum. No gross colonic mass. No colonic wall thickening. No substantial diverticular change. Anastomotic staple line noted in the rectosigmoid region. Vascular/Lymphatic: There is abdominal aortic atherosclerosis without aneurysm. There is no gastrohepatic or hepatoduodenal ligament lymphadenopathy. No intraperitoneal or retroperitoneal lymphadenopathy. No pelvic sidewall lymphadenopathy. Reproductive: Uterus surgically absent. Other: The multiple soft tissue masses in the central pelvis are again identified. Right paracolic gutter lesion measures 1.4 x 1.7 cm today compared to 2.1 x 2.9 cm previously. 4.5 x 5.4 cm mass just posterior to the bladder (113/2) has increased from 4.5 x 4.9 cm when I remeasure in a similar fashion on the prior study. The lesion in the right adnexal space appears more cystic and has decreased in the interval measuring  3.4 x 3.7 cm (108/2) compared to 3.9 x 5.5 cm previously. Cystic lesion in the presacral space has clearly progressed and a heterogeneously enhancing lesion posterior to the rectum and just anterior to the coccyx measures 5.2 x 3.7 cm today compared to 2.9 x 2.1 cm when I remeasure on the prior study. Enhancing nodules in the right presacral space (110/2) are new in the interval presacral lesion just distal to the aortic bifurcation measures 2.4 x 5.1 cm today. Musculoskeletal: Status post right hip replacement. No worrisome lytic or sclerotic osseous abnormality. IMPRESSION: 1. Granulosa cell tumor, by report on observation without current therapy. Somewhat confusing findings on today's exam given this history. The right paracolic gutter mass and a right adnexal mass have clearly decreased in size in the interval. Other masses appear relatively similar, but presacral disease and retroperitoneal disease at the aortic bifurcation have clearly progressed. Disease today represents a combination of mainly cystic, mainly solid, and combined appearance. 2. Interval development of subpleural plaque-like airspace opacity with volume loss in the left lower lobe, likely evolving scar/rounded atelectasis. Tiny left pleural effusion/thickening associated. Attention on follow-up recommended. 3. Bilateral pulmonary nodules, largely stable since prior exam. 4 mm ground-glass nodule in the left lung apex is new in the interval. Attention on follow-up exam suggested. 4.  Aortic Atherosclerois (ICD10-170.0) 5.  Emphysema. ([id number]-X44.9) Electronically Signed   By:  Misty Stanley M.D.   On: 04/08/2018 13:53   Ct Abdomen Pelvis W Contrast  Result Date: 05/05/2018 CLINICAL DATA:  Masses in the pelvis.  Pain management. EXAM: CT ABDOMEN AND PELVIS WITH CONTRAST TECHNIQUE: Multidetector CT imaging of the abdomen and pelvis was performed using the standard protocol following bolus administration of intravenous contrast. CONTRAST:  145mL  ISOVUE-300 IOPAMIDOL (ISOVUE-300) INJECTION 61% COMPARISON:  May 01, 2018 FINDINGS: Lower chest: Left effusion and adjacent platelike scar atelectasis is stable. AICD leads terminate in the right ventricle. Cardiomegaly again identified. Small hiatal hernia. No other changes in the lower chest. Hepatobiliary: Suspicion of a tiny nodule in the hepatic dome on series 2, image 14 not seen previously, measuring 5.7 mm. No other liver nodules or masses. Cholelithiasis. The portal vein is patent. Pancreas: Unremarkable. No pancreatic ductal dilatation or surrounding inflammatory changes. Spleen: Normal in size without focal abnormality. Adrenals/Urinary Tract: Adrenal glands are normal. Mild left hydronephrosis and mild left ureterectasis are more prominent in the interval. No hydronephrosis on the right. The bladder is unchanged. Stomach/Bowel: The stomach and small bowel are normal. The sigmoid colon cannot be separated from pelvic masses, similar in the interval. Postoperative changes seen in the sigmoid colon. No colonic obstruction. The remainder of the colon is otherwise unremarkable. The appendix is not well seen consistent with history of previous appendectomy. Vascular/Lymphatic: Atherosclerotic changes are seen in the nonaneurysmal aorta. Reproductive: The patient is status post hysterectomy. Other: The right pericolic nodule measures 15 by 11 mm today versus 14 x 10 mm previously. Multiple pelvic masses are again identified. The mass inferior to the bifurcation measures 4.4 by 2.6 cm today versus 4.7 x 2.6 cm previously. The right adnexal low-density lesion measures 3.8 by 1.9 cm today versus 3.6 x 2.4 cm previously. The mass posterior to the bladder measures 5.5 x 4.5 cm today versus 5.4 x 4.4 cm previously. The presacral mass measures 6.8 by 6.3 cm today versus 6.7 x 6.4 cm previously. Adjacent pelvic nodes are similar in the interval. Musculoskeletal: Previous right hip replacement. Degenerative changes  in the left hip. No bony metastatic disease. IMPRESSION: 1. The right para colic nodule/mass and the multiple pelvic masses are similar in the interval. Slight differences in measurement could be due to difference in slice selection. Recommend continued attention on follow-up. 2. Mild left hydronephrosis, new in the interval, likely due to compression of the distal left ureter by 1 or more of the pelvic masses. No obstruction on the right. 3. Suspicion of a new nodule in the hepatic dome as above. Recommend attention on follow-up. Alternatively, an MRI could further assess if clinically warranted. 4. Atherosclerotic changes in the nonaneurysmal aorta. 5. Stable left effusion and platelike scarring/atelectasis. Electronically Signed   By: Dorise Bullion III M.D   On: 05/05/2018 00:08   Ct Abdomen Pelvis W Contrast  Result Date: 05/01/2018 CLINICAL DATA:  Pain in the region of tailbone biopsy EXAM: CT ABDOMEN AND PELVIS WITH CONTRAST TECHNIQUE: Multidetector CT imaging of the abdomen and pelvis was performed using the standard protocol following bolus administration of intravenous contrast. CONTRAST:  160mL OMNIPAQUE IOHEXOL 300 MG/ML  SOLN COMPARISON:  05/01/2018, 04/08/2018, 08/26/2017, 06/03/2017 CT FINDINGS: Lower chest: Lung bases again demonstrate small left pleural effusion with platelike consolidation in the posterior peripheral left lung base, similar as compared with 04/08/2018. Partially visualized cardiac pacing leads. Borderline cardiomegaly. Hepatobiliary: No focal liver abnormality is seen. No gallstones, gallbladder wall thickening, or biliary dilatation. Pancreas: Unremarkable. No pancreatic ductal  dilatation or surrounding inflammatory changes. Spleen: Normal in size without focal abnormality. Adrenals/Urinary Tract: Adrenal glands are unremarkable. Kidneys are normal, without renal calculi, focal lesion, or hydronephrosis. Mass-effect on the posterior bladder by pelvic mass. Stomach/Bowel:  Stomach is nonenlarged. No dilated small bowel. No colon wall thickening. Postsurgical changes of the rectosigmoid colon. Sigmoid colon difficult to separate from central pelvic mass. Vascular/Lymphatic: Moderate aortic atherosclerosis. No aneurysmal dilatation. Reproductive: History of hysterectomy and oophorectomy on epic. Other: Right paracolic gutter lesion measures 14 x 10 mm compared with 14 x 17 mm previously. Multiple pelvic masses are again identified. Mass inferior to the iliac bifurcation measures 47 x 26 mm compared with 51 x 24 mm previously. Right adnexal low-density lesion measures 36 x 24 mm compared with 34 x 37 mm previously. Central low pelvic mass posterior to the bladder measures 44 x 54 mm, previously 54 x 45 mm. Increased size of presacral lesion, this measures 64 x 67 mm, compared with 51 x 37 mm. Surrounding hazy attenuation, may be secondary to post biopsy change or inflammation of the mass. There is mass effect on the adjacent rectum. Size of this mass does not appear significantly changed when compared to the biopsy images performed today. Multiple additional presacral masses are noted. There are multiple enhancing nodules in the right pelvic fat. There is edema in the perirectal space. Musculoskeletal: No acute or suspicious osseous abnormality. Prior right hip replacement. IMPRESSION: 1. Multiple known pelvic masses. Significant increase in size of the biopsied presacral lesion as compared with 04/08/2018. There is soft tissue stranding surrounding the mass which may be secondary to post biopsy change or inflammation of the mass. This mass exerts mass effect on the rectum. There is perirectal edema. There are multiple additional pelvic masses which are grossly stable in size since the comparison CT. The mass situated posterior to the bladder is difficult to separate from adjacent sigmoid colon and question invasion. 2. Similar appearance of small left pleural effusion with platelike  consolidation in the peripheral left lung base. Electronically Signed   By: Donavan Foil M.D.   On: 05/01/2018 18:58   Ct Abdomen Pelvis W Contrast  Result Date: 04/08/2018 CLINICAL DATA:  Granulosa cell tumor of the ovary. Elevated tumor markers. EXAM: CT CHEST, ABDOMEN, AND PELVIS WITH CONTRAST TECHNIQUE: Multidetector CT imaging of the chest, abdomen and pelvis was performed following the standard protocol during bolus administration of intravenous contrast. CONTRAST:  30mL ISOVUE-300 IOPAMIDOL (ISOVUE-300) INJECTION 61% COMPARISON:  CTA chest/abdomen/pelvis 08/26/2017. FINDINGS: CT CHEST FINDINGS Cardiovascular: The heart size is normal. No substantial pericardial effusion. Atherosclerotic calcification is noted in the wall of the thoracic aorta. Status post mitral valve replacement. Left-sided permanent pacemaker/AICD evident. Mediastinum/Nodes: No mediastinal lymphadenopathy. 15 mm short axis paratracheal lymph node measured on the prior study has decreased in the interval now measuring 7 mm. There is no hilar lymphadenopathy. The esophagus has normal imaging features. There is no axillary lymphadenopathy. Lungs/Pleura: The central tracheobronchial airways are patent. Centrilobular emphysema noted. 4 mm right perifissural nodule (71/3) is stable. 10 mm ground-glass nodule identified posterior right upper lobe on prior study is less confluent today and measures 7 mm. 4 mm ground-glass nodule identified in the left apex with additional peripheral ground-glass nodules. 6 mm left upper lobe nodule (42/3) is similar to prior. Interval development of subpleural plaque-like airspace opacity associated with volume loss. Evolving tiny pleural effusion versus pleural thickening evident. Musculoskeletal: No worrisome lytic or sclerotic osseous abnormality. Degenerative changes left shoulder with intra-articular  loose bodies evident. Sclerotic lesion in the right T5 transverse process is stable. CT ABDOMEN PELVIS  FINDINGS Hepatobiliary: No focal abnormality within the liver parenchyma. Possible tiny dependent gallstones (62/2). No intrahepatic or extrahepatic biliary dilation. Pancreas: No focal mass lesion. No dilatation of the main duct. No intraparenchymal cyst. No peripancreatic edema. Spleen: No splenomegaly. No focal mass lesion. Adrenals/Urinary Tract: No adrenal nodule or mass. Kidneys unremarkable No evidence for hydroureter. The urinary bladder appears normal for the degree of distention. Stomach/Bowel: Tiny hiatal hernia. Stomach is nondistended. No gastric wall thickening. No evidence of outlet obstruction. Duodenum is normally positioned as is the ligament of Treitz. No small bowel wall thickening. No small bowel dilatation. The terminal ileum is normal. The appendix is not visualized, but there is no edema or inflammation in the region of the cecum. No gross colonic mass. No colonic wall thickening. No substantial diverticular change. Anastomotic staple line noted in the rectosigmoid region. Vascular/Lymphatic: There is abdominal aortic atherosclerosis without aneurysm. There is no gastrohepatic or hepatoduodenal ligament lymphadenopathy. No intraperitoneal or retroperitoneal lymphadenopathy. No pelvic sidewall lymphadenopathy. Reproductive: Uterus surgically absent. Other: The multiple soft tissue masses in the central pelvis are again identified. Right paracolic gutter lesion measures 1.4 x 1.7 cm today compared to 2.1 x 2.9 cm previously. 4.5 x 5.4 cm mass just posterior to the bladder (113/2) has increased from 4.5 x 4.9 cm when I remeasure in a similar fashion on the prior study. The lesion in the right adnexal space appears more cystic and has decreased in the interval measuring 3.4 x 3.7 cm (108/2) compared to 3.9 x 5.5 cm previously. Cystic lesion in the presacral space has clearly progressed and a heterogeneously enhancing lesion posterior to the rectum and just anterior to the coccyx measures 5.2 x  3.7 cm today compared to 2.9 x 2.1 cm when I remeasure on the prior study. Enhancing nodules in the right presacral space (110/2) are new in the interval presacral lesion just distal to the aortic bifurcation measures 2.4 x 5.1 cm today. Musculoskeletal: Status post right hip replacement. No worrisome lytic or sclerotic osseous abnormality. IMPRESSION: 1. Granulosa cell tumor, by report on observation without current therapy. Somewhat confusing findings on today's exam given this history. The right paracolic gutter mass and a right adnexal mass have clearly decreased in size in the interval. Other masses appear relatively similar, but presacral disease and retroperitoneal disease at the aortic bifurcation have clearly progressed. Disease today represents a combination of mainly cystic, mainly solid, and combined appearance. 2. Interval development of subpleural plaque-like airspace opacity with volume loss in the left lower lobe, likely evolving scar/rounded atelectasis. Tiny left pleural effusion/thickening associated. Attention on follow-up recommended. 3. Bilateral pulmonary nodules, largely stable since prior exam. 4 mm ground-glass nodule in the left lung apex is new in the interval. Attention on follow-up exam suggested. 4.  Aortic Atherosclerois (ICD10-170.0) 5.  Emphysema. ([id number]) Electronically Signed   By: Misty Stanley M.D.   On: 04/08/2018 13:53   Ct Biopsy  Result Date: 05/01/2018 INDICATION: 57 year old female with a history of granulosa cell tumor of the ovary, unspecified laterality. She has demonstrated evidence of progressive disease on her most recent CT imaging and presents for CT-guided biopsy of the same. Of note, she has had progressive severe pelvic pain over the past 5 days which is unrelieved by Percocet. She intends on going to the emergency room following her procedure and recovery today for further evaluation and management. I have contacted her oncologist,  Dr. Marin Olp, and he  is aware and agrees with her visiting the emergency room. The patient still desires to proceed with the biopsy in order to get tissue for molecular studies. EXAM: CT BIOPSY MEDICATIONS: None. ANESTHESIA/SEDATION: Moderate (conscious) sedation was employed during this procedure. A total of Versed 1 mg and Fentanyl 50 mcg was administered intravenously. Moderate Sedation Time: 15 minutes. The patient's level of consciousness and vital signs were monitored continuously by radiology nursing throughout the procedure under my direct supervision. FLUOROSCOPY TIME:  Fluoroscopy Time: 0 minutes 0 seconds (0 mGy). COMPLICATIONS: None immediate. PROCEDURE: Informed written consent was obtained from the patient after a thorough discussion of the procedural risks, benefits and alternatives. All questions were addressed. A timeout was performed prior to the initiation of the procedure. A planning axial CT scan was performed. The heterogeneous mass in the presacral space was successfully identified. A suitable skin entry site was selected and marked. The region was sterilely prepped and draped in standard fashion using chlorhexidine skin prep. Local anesthesia was attained by infiltration with 1% lidocaine. A small dermatotomy was made. Under intermittent CT guidance, an 18 gauge introducer needle was advanced into the margin of the mass. Multiple 18 gauge core biopsies were then coaxially obtained using the bio Pince automated biopsy device. Biopsy specimens were placed in formalin and delivered to pathology for further analysis. The introducer needle was removed. Post biopsy axial CT imaging demonstrates no evidence of immediate complication. The patient tolerated the procedure well. IMPRESSION: Technically successful CT-guided core biopsy of presacral mass. Electronically Signed   By: Jacqulynn Cadet M.D.   On: 05/01/2018 13:44    Time Spent in minutes  25   Havish Petties M.D on 05/06/2018 at 1:47 PM  Between 7am to  7pm - Pager - [phone]  After 7pm go to [email or web address] - password [facility]  [facility] -  Office  [phone]

## 2018-05-06 NOTE — Progress Notes (Signed)
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 female who is seen out courtesy of Dr. Fermin Schwab for an opinion concerning radiation therapy as part of management of patient's recurrent granulosa cell tumor originally presenting in the right ovary. Patient was recently admitted to the hospital with severe posterior pelvic pain related to her malignancy. She has a history of recurrent granulosa cell tumor of the ovary, initially diagnosed in 2007, status post surgery followed by 3 cycles of chemotherapy.  She had a radical debulking along with rectosigmoid resection with low rectal anastomosis, right distal ureter resection, ureteroneocystostom and omentectomy in 2014 followed by  chemotherapy.  She had exploratory laparotomy with radical resection of recurrent tumor in 2017.  Patient now following with Dr. Marin Olp and presenting with severe persistent pelvic pain.  Patient had a recent retroperitoneal soft tissue biopsy done on 11/8 showing granulosa cell tumor and CT of the abdomen showing multiple pelvic masses and mild left hydronephrosis with suspicious new nodule in the hepatic dome.  Patient admitted for adequate pain control.   PREVIOUS RADIATION THERAPY: No  PAST MEDICAL HISTORY:  has a past medical history of Arthritis, Asthma, Automatic implantable cardioverter-defibrillator in situ, Bladder cancer ([place]) (3536), Chronic systolic CHF (congestive heart failure) ([place]), CKD (chronic kidney disease), stage III ([place]), Coronary artery disease, Granulosa cell carcinoma of ovary ([place]), H/O thymectomy, High cholesterol, Hypothyroidism, ICD  (implantable cardiac defibrillator) in place, Ischemic cardiomyopathy, Myocardial infarction ([place]) (10/14/2009), Neuropathy, PAF (paroxysmal atrial fibrillation) ([place]), Pain, Port-A-Cath in place, Prosthetic valve dysfunction, S/P mitral valve replacement with bioprosthetic valve (10/21/2009), S/P valve-in-valve transcatheter mitral valve replacement (09/16/2017), SVT (supraventricular tachycardia) ([place]), Tobacco abuse, and Ventricular tachycardia ([place]).    PAST SURGICAL HISTORY: Past Surgical History:  Procedure Laterality Date  . ABDOMINAL HYSTERECTOMY N/A 07/27/2013   Procedure: TUMOR EXCISION OF ABDOMINAL MASS;  Surgeon: Alvino Chapel, MD;  Location: [facility];  Service: Gynecology;  Laterality: N/A;  . APPENDECTOMY  1989  . BILATERAL OOPHORECTOMY  2007  . CARDIAC DEFIBRILLATOR PLACEMENT  02/06/2012   "lead change"  . CARDIAC VALVE REPLACEMENT    . CYSTOSCOPY Right 02/23/2013   Procedure: CYSTOSCOPY WITH STENT REMOVAL;  Surgeon: Alvino Chapel, MD;  Location: [facility];  Service: Gynecology;  Laterality: Right;  . IMPLANTABLE CARDIOVERTER DEFIBRILLATOR REVISION N/A 02/06/2012   Procedure: IMPLANTABLE CARDIOVERTER DEFIBRILLATOR REVISION;  Surgeon: Evans Lance, MD;  Location: [facility] C[facility] LAB;  Service: Cardiovascular;  Laterality: N/A;  . INSERT / REPLACE / REMOVE PACEMAKER  10/2009   DUAL-CHAMBER [place]UDE DEFIBRILLATOR  . LAPAROTOMY N/A 01/19/2013   Procedure: TUMOR DEBULKING / BOWEL RESECTION/INSERTION RIGHT UTERERAL DOUBLE J STENT/OMENTECTOMY;  Surgeon: Alvino Chapel, MD;  Location: [facility];  Service: Gynecology;  Laterality: N/A;  . MITRAL VALVE REPLACEMENT  10/21/2009   50mm Edwards [place]imount bovine pericardial tissue valve - [facility]  . RIGHT/LEFT HEART CATH AND CORONARY ANGIOGRAPHY N/A 07/02/2017   Procedure: RIGHT/LEFT HEART CATH AND CORONARY ANGIOGRAPHY;  Surgeon: Belva Crome, MD;  Location: [place] CV LAB;  Service: Cardiovascular;  Laterality: N/A;  . TEE  WITHOUT CARDIOVERSION N/A 09/16/2017   Procedure: TRANSESOPHAGEAL ECHOCARDIOGRAM (TEE);  Surgeon: [name], MD;  Location: [facility] OR;  Service: Open Heart Surgery;  Laterality: N/A;  . THYMECTOMY  2009  . TONSILLECTOMY AND ADENOIDECTOMY  ~ 1967  . TOTAL HIP ARTHROPLASTY  05/06/2012   Procedure: TOTAL HIP ARTHROPLASTY;  Surgeon: Gearlean Alf, MD;  Location: [facility];  Service: Orthopedics;  Laterality: Right;  . TRANSCATHETER MITRAL VALVE REPLACEMENT, TRANSAPICAL N/A 09/16/2017   Procedure: TRANSCATHETER MITRAL VALVE REPLACEMENT,TRANSAPICAL;  Surgeon: [name], MD;  Location: [facility];  Service: Open Heart Surgery;  Laterality: N/A;  . TRANSURETHRAL RESECTION OF BLADDER  2009   "for bladder cancer"  . TUBAL LIGATION  1993  . VAGINAL HYSTERECTOMY  2001    FAMILY HISTORY: family history includes Heart attack in her brother, maternal grandfather, and paternal grandfather; Stroke in her maternal grandmother.  SOCIAL HISTORY:  reports that she has been smoking cigarettes. She started smoking about 44 years ago. She has a 15.00 pack-year smoking history. She has never used smokeless tobacco. She reports that she does not drink alcohol or use drugs.  ALLERGIES: Prochlorperazine edisylate; Adhesive [tape]; and Prochlorperazine  MEDICATIONS:  No current facility-administered medications for this encounter.    Current Outpatient Medications  Medication Sig Dispense Refill  . HYDROmorphone (DILAUDID) 4 MG tablet Take 1 tablet (4 mg total) by mouth every 4 (four) hours as needed for severe pain. 15 tablet 0   Facility-Administered Medications Ordered in Other Encounters  Medication Dose Route Frequency Provider Last Rate Last Dose  . acetaminophen (TYLENOL) tablet 1,000 mg  1,000 mg Oral Q6H PRN Shelly Coss, MD   1,000 mg at 05/06/18 0428  . albuterol (PROVENTIL) (2.5 MG/3ML) 0.083% nebulizer solution 3 mL  3 mL Inhalation Q4H PRN Adhikari, Amrit, MD      . alum & mag hydroxide-simeth  (MAALOX/MYLANTA) 200-200-20 MG/5ML suspension 30 mL  30 mL Oral Q4H PRN Dhungel, Nishant, MD      . aspirin EC tablet 81 mg  81 mg Oral Daily Adhikari, Amrit, MD   81 mg at 05/06/18 1048  . carvedilol (COREG) tablet 3.125 mg  3.125 mg Oral BID WC Shelly Coss, MD   3.125 mg at 05/06/18 1751  . enoxaparin (LOVENOX) injection 80 mg  80 mg Subcutaneous Q12H Eudelia Bunch, RPH   80 mg at 05/06/18 1752  . furosemide (LASIX) tablet 20 mg  20 mg Oral Daily Shelly Coss, MD   20 mg at 05/06/18 1048  . gabapentin (NEURONTIN) capsule 300 mg  300 mg Oral TID Volanda Napoleon, MD   300 mg at 05/06/18 1751  . HYDROmorphone (DILAUDID) injection 2 mg  2 mg Intravenous Q2H PRN Dhungel, Nishant, MD   2 mg at 05/06/18 1754  . lidocaine (XYLOCAINE) 2 % viscous mouth solution 15 mL  15 mL Mouth/Throat Q4H PRN Dhungel, Nishant, MD   15 mL at 05/06/18 1109  . LORazepam (ATIVAN) tablet 0.5 mg  0.5 mg Oral Q6H PRN Shelly Coss, MD   0.5 mg at 05/06/18 1834  . methocarbamol (ROBAXIN) tablet 500 mg  500 mg Oral Q6H PRN Shelly Coss, MD   500 mg at 05/06/18 1432  . oxyCODONE (OXYCONTIN) 12 hr tablet 15 mg  15 mg Oral Q12H Volanda Napoleon, MD   15 mg at 05/06/18 1048  . polyethylene glycol (MIRALAX / GLYCOLAX) packet 17 g  17 g Oral BID Dhungel, Nishant, MD   17 g at 05/06/18 1048  . spironolactone (ALDACTONE) tablet 25 mg  25 mg Oral Daily Shelly Coss, MD   25  mg at 05/06/18 1049  . Warfarin - Pharmacist Dosing Inpatient   Does not apply q1800 Eudelia Bunch, RPH      . zolpidem (AMBIEN) tablet 5 mg  5 mg Oral QHS Shelly Coss, MD   5 mg at 05/05/18 2212    REVIEW OF SYSTEMS: REVIEW OF SYSTEMS: A 10+ POINT REVIEW OF SYSTEMS WAS OBTAINED including neurology, dermatology, psychiatry, cardiac, respiratory, lymph, extremities, GI, GU, musculoskeletal, constitutional, reproductive, HEENT. All pertinent positives are noted in the HPI. All others are negative.   PHYSICAL EXAM:  Vitals - 1 value per visit  15/40/0867  SYSTOLIC 619  DIASTOLIC 67  Pulse 83  Temperature 97.7  Respirations 20  Weight (lb)   Height   BMI   VISIT REPORT    This is a very pleasant 57 year old female lying on her left side in light of her pain in the posterior pelvis area. She is teary-eyed in light of her unrelenting pain. Patient is alert oriented and quite pleasant. The lungs are clear. The heart has a regular rhythm and rate. The abdomen is soft and nontender with normal bowel sounds. Motor strength is 5 out of 5 in the proximal and distal muscle groups of the upper lower extremities. No palpable supraclavicular or axillary adenopathy.  patient points to pain in the lower buttocks region    ECOG = 3  0 - Asymptomatic (Fully active, able to carry on all predisease activities without restriction)  1 - Symptomatic but completely ambulatory (Restricted in physically strenuous activity but ambulatory and able to carry out work of a light or sedentary nature. For example, light housework, office work)  2 - Symptomatic, <50% in bed during the day (Ambulatory and capable of all self care but unable to carry out any work activities. Up and about more than 50% of waking hours)  3 - Symptomatic, >50% in bed, but not bedbound (Capable of only limited self-care, confined to bed or chair 50% or more of waking hours)  4 - Bedbound (Completely disabled. Cannot carry on any self-care. Totally confined to bed or chair)  5 - Death   Eustace Pen MM, Creech RH, Tormey DC, et al. [phone]). "Toxicity and response criteria of the [facility]". [place] Oncol. 5 (6): 649-55  LABORATORY DATA:  Lab Results  Component Value Date   WBC 10.1 05/06/2018   HGB 11.5 (L) 05/06/2018   HCT 36.2 05/06/2018   MCV 92.8 05/06/2018   PLT 194 05/06/2018   NEUTROABS 15.6 (H) 05/02/2018   Lab Results  Component Value Date   NA 137 05/06/2018   K 3.6 05/06/2018   CL 101 05/06/2018   CO2 27 05/06/2018   GLUCOSE 104 (H)  05/06/2018   CREATININE 0.92 05/06/2018   CALCIUM 8.8 (L) 05/06/2018      RADIOGRAPHY: Ct Chest W Contrast  Result Date: 04/08/2018 CLINICAL DATA:  Granulosa cell tumor of the ovary. Elevated tumor markers. EXAM: CT CHEST, ABDOMEN, AND PELVIS WITH CONTRAST TECHNIQUE: Multidetector CT imaging of the chest, abdomen and pelvis was performed following the standard protocol during bolus administration of intravenous contrast. CONTRAST:  45mL ISOVUE-300 IOPAMIDOL (ISOVUE-300) INJECTION 61% COMPARISON:  CTA chest/abdomen/pelvis 08/26/2017. FINDINGS: CT CHEST FINDINGS Cardiovascular: The heart size is normal. No substantial pericardial effusion. Atherosclerotic calcification is noted in the wall of the thoracic aorta. Status post mitral valve replacement. Left-sided permanent pacemaker/AICD evident. Mediastinum/Nodes: No mediastinal lymphadenopathy. 15 mm short axis paratracheal lymph node measured on the prior study has decreased  in the interval now measuring 7 mm. There is no hilar lymphadenopathy. The esophagus has normal imaging features. There is no axillary lymphadenopathy. Lungs/Pleura: The central tracheobronchial airways are patent. Centrilobular emphysema noted. 4 mm right perifissural nodule (71/3) is stable. 10 mm ground-glass nodule identified posterior right upper lobe on prior study is less confluent today and measures 7 mm. 4 mm ground-glass nodule identified in the left apex with additional peripheral ground-glass nodules. 6 mm left upper lobe nodule (42/3) is similar to prior. Interval development of subpleural plaque-like airspace opacity associated with volume loss. Evolving tiny pleural effusion versus pleural thickening evident. Musculoskeletal: No worrisome lytic or sclerotic osseous abnormality. Degenerative changes left shoulder with intra-articular loose bodies evident. Sclerotic lesion in the right T5 transverse process is stable. CT ABDOMEN PELVIS FINDINGS Hepatobiliary: No focal  abnormality within the liver parenchyma. Possible tiny dependent gallstones (62/2). No intrahepatic or extrahepatic biliary dilation. Pancreas: No focal mass lesion. No dilatation of the main duct. No intraparenchymal cyst. No peripancreatic edema. Spleen: No splenomegaly. No focal mass lesion. Adrenals/Urinary Tract: No adrenal nodule or mass. Kidneys unremarkable No evidence for hydroureter. The urinary bladder appears normal for the degree of distention. Stomach/Bowel: Tiny hiatal hernia. Stomach is nondistended. No gastric wall thickening. No evidence of outlet obstruction. Duodenum is normally positioned as is the ligament of Treitz. No small bowel wall thickening. No small bowel dilatation. The terminal ileum is normal. The appendix is not visualized, but there is no edema or inflammation in the region of the cecum. No gross colonic mass. No colonic wall thickening. No substantial diverticular change. Anastomotic staple line noted in the rectosigmoid region. Vascular/Lymphatic: There is abdominal aortic atherosclerosis without aneurysm. There is no gastrohepatic or hepatoduodenal ligament lymphadenopathy. No intraperitoneal or retroperitoneal lymphadenopathy. No pelvic sidewall lymphadenopathy. Reproductive: Uterus surgically absent. Other: The multiple soft tissue masses in the central pelvis are again identified. Right paracolic gutter lesion measures 1.4 x 1.7 cm today compared to 2.1 x 2.9 cm previously. 4.5 x 5.4 cm mass just posterior to the bladder (113/2) has increased from 4.5 x 4.9 cm when I remeasure in a similar fashion on the prior study. The lesion in the right adnexal space appears more cystic and has decreased in the interval measuring 3.4 x 3.7 cm (108/2) compared to 3.9 x 5.5 cm previously. Cystic lesion in the presacral space has clearly progressed and a heterogeneously enhancing lesion posterior to the rectum and just anterior to the coccyx measures 5.2 x 3.7 cm today compared to 2.9 x 2.1  cm when I remeasure on the prior study. Enhancing nodules in the right presacral space (110/2) are new in the interval presacral lesion just distal to the aortic bifurcation measures 2.4 x 5.1 cm today. Musculoskeletal: Status post right hip replacement. No worrisome lytic or sclerotic osseous abnormality. IMPRESSION: 1. Granulosa cell tumor, by report on observation without current therapy. Somewhat confusing findings on today's exam given this history. The right paracolic gutter mass and a right adnexal mass have clearly decreased in size in the interval. Other masses appear relatively similar, but presacral disease and retroperitoneal disease at the aortic bifurcation have clearly progressed. Disease today represents a combination of mainly cystic, mainly solid, and combined appearance. 2. Interval development of subpleural plaque-like airspace opacity with volume loss in the left lower lobe, likely evolving scar/rounded atelectasis. Tiny left pleural effusion/thickening associated. Attention on follow-up recommended. 3. Bilateral pulmonary nodules, largely stable since prior exam. 4 mm ground-glass nodule in the left lung apex is  new in the interval. Attention on follow-up exam suggested. 4.  Aortic Atherosclerois (ICD10-170.0) 5.  Emphysema. ([id number]) Electronically Signed   By: Misty Stanley M.D.   On: 04/08/2018 13:53   Ct Abdomen Pelvis W Contrast  Result Date: 05/05/2018 CLINICAL DATA:  Masses in the pelvis.  Pain management. EXAM: CT ABDOMEN AND PELVIS WITH CONTRAST TECHNIQUE: Multidetector CT imaging of the abdomen and pelvis was performed using the standard protocol following bolus administration of intravenous contrast. CONTRAST:  1109mL ISOVUE-300 IOPAMIDOL (ISOVUE-300) INJECTION 61% COMPARISON:  May 01, 2018 FINDINGS: Lower chest: Left effusion and adjacent platelike scar atelectasis is stable. AICD leads terminate in the right ventricle. Cardiomegaly again identified. Small hiatal hernia.  No other changes in the lower chest. Hepatobiliary: Suspicion of a tiny nodule in the hepatic dome on series 2, image 14 not seen previously, measuring 5.7 mm. No other liver nodules or masses. Cholelithiasis. The portal vein is patent. Pancreas: Unremarkable. No pancreatic ductal dilatation or surrounding inflammatory changes. Spleen: Normal in size without focal abnormality. Adrenals/Urinary Tract: Adrenal glands are normal. Mild left hydronephrosis and mild left ureterectasis are more prominent in the interval. No hydronephrosis on the right. The bladder is unchanged. Stomach/Bowel: The stomach and small bowel are normal. The sigmoid colon cannot be separated from pelvic masses, similar in the interval. Postoperative changes seen in the sigmoid colon. No colonic obstruction. The remainder of the colon is otherwise unremarkable. The appendix is not well seen consistent with history of previous appendectomy. Vascular/Lymphatic: Atherosclerotic changes are seen in the nonaneurysmal aorta. Reproductive: The patient is status post hysterectomy. Other: The right pericolic nodule measures 15 by 11 mm today versus 14 x 10 mm previously. Multiple pelvic masses are again identified. The mass inferior to the bifurcation measures 4.4 by 2.6 cm today versus 4.7 x 2.6 cm previously. The right adnexal low-density lesion measures 3.8 by 1.9 cm today versus 3.6 x 2.4 cm previously. The mass posterior to the bladder measures 5.5 x 4.5 cm today versus 5.4 x 4.4 cm previously. The presacral mass measures 6.8 by 6.3 cm today versus 6.7 x 6.4 cm previously. Adjacent pelvic nodes are similar in the interval. Musculoskeletal: Previous right hip replacement. Degenerative changes in the left hip. No bony metastatic disease. IMPRESSION: 1. The right para colic nodule/mass and the multiple pelvic masses are similar in the interval. Slight differences in measurement could be due to difference in slice selection. Recommend continued attention  on follow-up. 2. Mild left hydronephrosis, new in the interval, likely due to compression of the distal left ureter by 1 or more of the pelvic masses. No obstruction on the right. 3. Suspicion of a new nodule in the hepatic dome as above. Recommend attention on follow-up. Alternatively, an MRI could further assess if clinically warranted. 4. Atherosclerotic changes in the nonaneurysmal aorta. 5. Stable left effusion and platelike scarring/atelectasis. Electronically Signed   By: Dorise Bullion III M.D   On: 05/05/2018 00:08   Ct Abdomen Pelvis W Contrast  Result Date: 05/01/2018 CLINICAL DATA:  Pain in the region of tailbone biopsy EXAM: CT ABDOMEN AND PELVIS WITH CONTRAST TECHNIQUE: Multidetector CT imaging of the abdomen and pelvis was performed using the standard protocol following bolus administration of intravenous contrast. CONTRAST:  143mL OMNIPAQUE IOHEXOL 300 MG/ML  SOLN COMPARISON:  05/01/2018, 04/08/2018, 08/26/2017, 06/03/2017 CT FINDINGS: Lower chest: Lung bases again demonstrate small left pleural effusion with platelike consolidation in the posterior peripheral left lung base, similar as compared with 04/08/2018. Partially visualized cardiac pacing  leads. Borderline cardiomegaly. Hepatobiliary: No focal liver abnormality is seen. No gallstones, gallbladder wall thickening, or biliary dilatation. Pancreas: Unremarkable. No pancreatic ductal dilatation or surrounding inflammatory changes. Spleen: Normal in size without focal abnormality. Adrenals/Urinary Tract: Adrenal glands are unremarkable. Kidneys are normal, without renal calculi, focal lesion, or hydronephrosis. Mass-effect on the posterior bladder by pelvic mass. Stomach/Bowel: Stomach is nonenlarged. No dilated small bowel. No colon wall thickening. Postsurgical changes of the rectosigmoid colon. Sigmoid colon difficult to separate from central pelvic mass. Vascular/Lymphatic: Moderate aortic atherosclerosis. No aneurysmal dilatation.  Reproductive: History of hysterectomy and oophorectomy on epic. Other: Right paracolic gutter lesion measures 14 x 10 mm compared with 14 x 17 mm previously. Multiple pelvic masses are again identified. Mass inferior to the iliac bifurcation measures 47 x 26 mm compared with 51 x 24 mm previously. Right adnexal low-density lesion measures 36 x 24 mm compared with 34 x 37 mm previously. Central low pelvic mass posterior to the bladder measures 44 x 54 mm, previously 54 x 45 mm. Increased size of presacral lesion, this measures 64 x 67 mm, compared with 51 x 37 mm. Surrounding hazy attenuation, may be secondary to post biopsy change or inflammation of the mass. There is mass effect on the adjacent rectum. Size of this mass does not appear significantly changed when compared to the biopsy images performed today. Multiple additional presacral masses are noted. There are multiple enhancing nodules in the right pelvic fat. There is edema in the perirectal space. Musculoskeletal: No acute or suspicious osseous abnormality. Prior right hip replacement. IMPRESSION: 1. Multiple known pelvic masses. Significant increase in size of the biopsied presacral lesion as compared with 04/08/2018. There is soft tissue stranding surrounding the mass which may be secondary to post biopsy change or inflammation of the mass. This mass exerts mass effect on the rectum. There is perirectal edema. There are multiple additional pelvic masses which are grossly stable in size since the comparison CT. The mass situated posterior to the bladder is difficult to separate from adjacent sigmoid colon and question invasion. 2. Similar appearance of small left pleural effusion with platelike consolidation in the peripheral left lung base. Electronically Signed   By: Donavan Foil M.D.   On: 05/01/2018 18:58   Ct Abdomen Pelvis W Contrast  Result Date: 04/08/2018 CLINICAL DATA:  Granulosa cell tumor of the ovary. Elevated tumor markers. EXAM: CT  CHEST, ABDOMEN, AND PELVIS WITH CONTRAST TECHNIQUE: Multidetector CT imaging of the chest, abdomen and pelvis was performed following the standard protocol during bolus administration of intravenous contrast. CONTRAST:  96mL ISOVUE-300 IOPAMIDOL (ISOVUE-300) INJECTION 61% COMPARISON:  CTA chest/abdomen/pelvis 08/26/2017. FINDINGS: CT CHEST FINDINGS Cardiovascular: The heart size is normal. No substantial pericardial effusion. Atherosclerotic calcification is noted in the wall of the thoracic aorta. Status post mitral valve replacement. Left-sided permanent pacemaker/AICD evident. Mediastinum/Nodes: No mediastinal lymphadenopathy. 15 mm short axis paratracheal lymph node measured on the prior study has decreased in the interval now measuring 7 mm. There is no hilar lymphadenopathy. The esophagus has normal imaging features. There is no axillary lymphadenopathy. Lungs/Pleura: The central tracheobronchial airways are patent. Centrilobular emphysema noted. 4 mm right perifissural nodule (71/3) is stable. 10 mm ground-glass nodule identified posterior right upper lobe on prior study is less confluent today and measures 7 mm. 4 mm ground-glass nodule identified in the left apex with additional peripheral ground-glass nodules. 6 mm left upper lobe nodule (42/3) is similar to prior. Interval development of subpleural plaque-like airspace opacity associated with volume  loss. Evolving tiny pleural effusion versus pleural thickening evident. Musculoskeletal: No worrisome lytic or sclerotic osseous abnormality. Degenerative changes left shoulder with intra-articular loose bodies evident. Sclerotic lesion in the right T5 transverse process is stable. CT ABDOMEN PELVIS FINDINGS Hepatobiliary: No focal abnormality within the liver parenchyma. Possible tiny dependent gallstones (62/2). No intrahepatic or extrahepatic biliary dilation. Pancreas: No focal mass lesion. No dilatation of the main duct. No intraparenchymal cyst. No  peripancreatic edema. Spleen: No splenomegaly. No focal mass lesion. Adrenals/Urinary Tract: No adrenal nodule or mass. Kidneys unremarkable No evidence for hydroureter. The urinary bladder appears normal for the degree of distention. Stomach/Bowel: Tiny hiatal hernia. Stomach is nondistended. No gastric wall thickening. No evidence of outlet obstruction. Duodenum is normally positioned as is the ligament of Treitz. No small bowel wall thickening. No small bowel dilatation. The terminal ileum is normal. The appendix is not visualized, but there is no edema or inflammation in the region of the cecum. No gross colonic mass. No colonic wall thickening. No substantial diverticular change. Anastomotic staple line noted in the rectosigmoid region. Vascular/Lymphatic: There is abdominal aortic atherosclerosis without aneurysm. There is no gastrohepatic or hepatoduodenal ligament lymphadenopathy. No intraperitoneal or retroperitoneal lymphadenopathy. No pelvic sidewall lymphadenopathy. Reproductive: Uterus surgically absent. Other: The multiple soft tissue masses in the central pelvis are again identified. Right paracolic gutter lesion measures 1.4 x 1.7 cm today compared to 2.1 x 2.9 cm previously. 4.5 x 5.4 cm mass just posterior to the bladder (113/2) has increased from 4.5 x 4.9 cm when I remeasure in a similar fashion on the prior study. The lesion in the right adnexal space appears more cystic and has decreased in the interval measuring 3.4 x 3.7 cm (108/2) compared to 3.9 x 5.5 cm previously. Cystic lesion in the presacral space has clearly progressed and a heterogeneously enhancing lesion posterior to the rectum and just anterior to the coccyx measures 5.2 x 3.7 cm today compared to 2.9 x 2.1 cm when I remeasure on the prior study. Enhancing nodules in the right presacral space (110/2) are new in the interval presacral lesion just distal to the aortic bifurcation measures 2.4 x 5.1 cm today. Musculoskeletal: Status  post right hip replacement. No worrisome lytic or sclerotic osseous abnormality. IMPRESSION: 1. Granulosa cell tumor, by report on observation without current therapy. Somewhat confusing findings on today's exam given this history. The right paracolic gutter mass and a right adnexal mass have clearly decreased in size in the interval. Other masses appear relatively similar, but presacral disease and retroperitoneal disease at the aortic bifurcation have clearly progressed. Disease today represents a combination of mainly cystic, mainly solid, and combined appearance. 2. Interval development of subpleural plaque-like airspace opacity with volume loss in the left lower lobe, likely evolving scar/rounded atelectasis. Tiny left pleural effusion/thickening associated. Attention on follow-up recommended. 3. Bilateral pulmonary nodules, largely stable since prior exam. 4 mm ground-glass nodule in the left lung apex is new in the interval. Attention on follow-up exam suggested. 4.  Aortic Atherosclerois (ICD10-170.0) 5.  Emphysema. ([id number]) Electronically Signed   By: Misty Stanley M.D.   On: 04/08/2018 13:53   Ct Biopsy  Result Date: 05/01/2018 INDICATION: 57 year old female with a history of granulosa cell tumor of the ovary, unspecified laterality. She has demonstrated evidence of progressive disease on her most recent CT imaging and presents for CT-guided biopsy of the same. Of note, she has had progressive severe pelvic pain over the past 5 days which is unrelieved by Percocet. She  intends on going to the emergency room following her procedure and recovery today for further evaluation and management. I have contacted her oncologist, Dr. Marin Olp, and he is aware and agrees with her visiting the emergency room. The patient still desires to proceed with the biopsy in order to get tissue for molecular studies. EXAM: CT BIOPSY MEDICATIONS: None. ANESTHESIA/SEDATION: Moderate (conscious) sedation was employed during  this procedure. A total of Versed 1 mg and Fentanyl 50 mcg was administered intravenously. Moderate Sedation Time: 15 minutes. The patient's level of consciousness and vital signs were monitored continuously by radiology nursing throughout the procedure under my direct supervision. FLUOROSCOPY TIME:  Fluoroscopy Time: 0 minutes 0 seconds (0 mGy). COMPLICATIONS: None immediate. PROCEDURE: Informed written consent was obtained from the patient after a thorough discussion of the procedural risks, benefits and alternatives. All questions were addressed. A timeout was performed prior to the initiation of the procedure. A planning axial CT scan was performed. The heterogeneous mass in the presacral space was successfully identified. A suitable skin entry site was selected and marked. The region was sterilely prepped and draped in standard fashion using chlorhexidine skin prep. Local anesthesia was attained by infiltration with 1% lidocaine. A small dermatotomy was made. Under intermittent CT guidance, an 18 gauge introducer needle was advanced into the margin of the mass. Multiple 18 gauge core biopsies were then coaxially obtained using the bio Pince automated biopsy device. Biopsy specimens were placed in formalin and delivered to pathology for further analysis. The introducer needle was removed. Post biopsy axial CT imaging demonstrates no evidence of immediate complication. The patient tolerated the procedure well. IMPRESSION: Technically successful CT-guided core biopsy of presacral mass. Electronically Signed   By: Jacqulynn Cadet M.D.   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 Patient has severe pain in the pelvis region which is likely related to her significant tumor masses most significant along the posterior pelvis region. Patient would be a good candidate for palliative radiation therapy directed at the pelvis area. I discussed the course of treatment side  effects and potential toxicities of pelvic radiation therapy with the patient this evening. She appears to understand wishes to proceed with planned course of treatment.  PLAN:patient will brought down to radiation oncology Department tomorrow for simulation, planning and her first treatment. Anticipate 5 weeks of radiation therapy directed at the pelvis region. Will continue radiation therapy as an outpatient once she is discharged with adequate analgesia.    ------------------------------------------------  Blair Promise, PhD, MD

## 2018-05-06 NOTE — Consult Note (Addendum)
Gynecologic Oncology Consultation  [name] 57 y.o. female  CC:  Chief Complaint  Patient presents with  . 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 Oncology hx per Dr. Judeth Porch note: She first underwent surgery in Feb 2007 for a stage I C granulosa cell tumor. She received 3 cycles of bleomycin, etoposide, and cisplatin chemotherapy as an adjunct.  Due to recurrence on CT imaging, she underwent a radical debulking of recurrent ovarian cancer including rectosigmoid resection with low rectal anastomosis, resection of right distal ureter, ureteroneocystostomy with psoas hitch, omentectomy with Dr. Fermin Schwab on 01/19/13.  Postoperatively, adjuvant therapy was recommended using carboplatin and Taxol. She received 4 cycles but did not tolerate very well and therefore discontinued. He CT scan in January 2015 showed a subcutaneous nodule in the lower abdomen and the inhibin B is a 39 units per mL.  On 07/27/2013, she underwent a wide local excision of subcutaneous tumor nodule (3 cm) with Dr. Fermin Schwab for recurrent disease.  She was placed on Femara and had been tolerating well after that time. CT imaging was performed on 06/15/15 that resulted mixed treatment response in the peritoneal tumor implants, significantly decreased size of the right paracolic gutter tumor implants, interval growth of the left pelvic side wall tumor, with no new sites of metastatic disease.  She was advised to continue taking the Femara with follow up scan for Feb or March.  Inhibin B increasing with value in Feb 2017 at 112.  Follow up imaging in March 2017 resulted: Mild progression of tumor in the left pelvis with increasing sidewall and perirectal nodularity. 2. Stable minimal nodularity in the right pericolic gutter. No other peritoneal disease identified. 3. No new lesions identified.     On September 19, 2015, she  underwent an exploratory laparotomy, radical resection of recurrent granulosa cell tumor including complete dissection of the right pararectal space and excision of tumor nodules, left ureterolysis for retroperitoneal fibrosis, resection of nodule from the left obturator space, resection of nodules from the right paracolic gutter, right para-aortic lymphadenectomy by Dr. Fermin Schwab.  Her post-operative course was uneventful except for low grade temperatures on post-op day 3.  Blood cultures were negative. Urine culture with mixed flora.  No further fevers reported during hospital stay.  Final path revealed: Final Diagnosis  A: Gutter nodule, left paracolic, removal  - Implant of granulosa cell tumor, at least 1.0 cm (see comment)  B: Lymph node, right paraaortic, removal  - No tumor identified in one lymph node (0/1)  C: Mass and right ovarian vessels, excision  - Segment of right fallopian tube with evidence of prior surgical transection, focal dilatation, and chronic perisalpingitis  - Medium-caliber vessels with intraluminal thrombus  - No granulosa cell tumor identified  D: Pelvic side wall nodule, right, biopsy  - Two implants of granulosa cell tumor, 0.7 cm and 0.6 cm  E: Mass, left pararectal, biopsy  - Cystic implant of granulosa cell tumor, 0.8 cm  F: Mass, left, pararectal, biopsy  - Three implants of granulosa cell tumor, up to 1.2 cm  - One lymph node negative for granulosa cell tumor (0/1)  G: Nodule, left pelvis bladder, biopsy  - Minute focus of granulosa cell tumor identified at the edge of fibrovascular and adipose tissue, may represent artifact  H: Pelvic mass, deep left, excision  - Implant of granulosa cell tumor, 2.2 cm, with cystic  change   Comment  The tumor implants noted in specimens A and D-H display the characteristic architectural and cytologic features of a granulosa cell tumor, including microfollicular pattern, blood-filled cysts, nuclear grooves, and  Call-Exner bodies. In some sections, the tumor nodules have apparent fibrous capsules. We favor that these are reactive capsules, but cannot exclude the possibility of granulosa cell tumor involving completely-replaced lymph nodes. The tumor was 3+ progesterone receptor, 1+ estrogen receptor positive.   She was placed on tamoxifen 20 mg daily for 2 weeks followed by Megace 40 mg 3 times a day for 2 weeks. This was begun on 11/24/2015. At that time and baseline inhibin B was obtained (201) after 3 months of therapy the inhibin B had fallen to 66. Unfortunately, beginning in December 2017 her inhibin B began to rise and was 122 units in March 2018. At this juncture we discontinued tamoxifen and Megace. She was seen last by Dr. Fermin Schwab in March 2018.  Interval History: She is currently admitted since 05/05/18 for severe sacral/perirectal pain.  She states the pain started last Monday and worsened.  She had a biopsy of the pre-sacral mass on Friday and went to the ER after due to the pain.  She was last seen in our office in March 2018 and she states she dropped the ball as far as getting in touch with Dr. Marin Olp or Dr. Fermin Schwab for follow up.  She has not been on treatment since her Megace and Tamoxifen were discontinued in March 2018. She reports a decreased appetite over the past month.  She states she has been having loose stools and passing flatus.  One episode of nausea when the severe pain started.  Denies early satiety.  Reports issues with urination due to the mass. Asking about surgery.   Review of Systems: Constitutional: Feels significant pain.  Decreased appetite over the past month. No fever, chills.   Cardiovascular: No chest pain, shortness of breath, or edema.  Pulmonary: No cough or wheeze.  Gastrointestinal: Positive for loose stools and one episode of emesis. No bright red blood per rectum or change in bowel movement.  Genitourinary: Feels urine has trouble coming out due to  mass. No vaginal bleeding or discharge.  Musculoskeletal: Severe sacral discomfort. Neurologic: No weakness, numbness, or change in gait.  Psychology: Feels overwhelmed.  Current Meds:   Current Facility-Administered Medications:  .  acetaminophen (TYLENOL) tablet 1,000 mg, 1,000 mg, Oral, Q6H PRN, Shelly Coss, MD, 1,000 mg at 05/06/18 0428 .  albuterol (PROVENTIL) (2.5 MG/3ML) 0.083% nebulizer solution 3 mL, 3 mL, Inhalation, Q4H PRN, Adhikari, Amrit, MD .  aspirin EC tablet 81 mg, 81 mg, Oral, Daily, Adhikari, Amrit, MD, 81 mg at 05/05/18 1315 .  carvedilol (COREG) tablet 3.125 mg, 3.125 mg, Oral, BID WC, Adhikari, Amrit, MD, 3.125 mg at 05/06/18 0749 .  enoxaparin (LOVENOX) injection 80 mg, 80 mg, Subcutaneous, Q12H, Eudelia Bunch, RPH, 80 mg at 05/06/18 0630 .  furosemide (LASIX) tablet 20 mg, 20 mg, Oral, Daily, Adhikari, Amrit, MD, 20 mg at 05/05/18 1315 .  gabapentin (NEURONTIN) capsule 300 mg, 300 mg, Oral, TID, Ennever, Rudell Cobb, MD .  HYDROmorphone (DILAUDID) injection 3 mg, 3 mg, Intravenous, Q2H PRN, Volanda Napoleon, MD, 3 mg at 05/06/18 0830 .  lidocaine (XYLOCAINE) 2 % viscous mouth solution 15 mL, 15 mL, Mouth/Throat, Q4H PRN, Dhungel, Nishant, MD .  LORazepam (ATIVAN) tablet 0.5 mg, 0.5 mg, Oral, Q6H PRN, Shelly Coss, MD, 0.5 mg at 05/06/18 0224 .  methocarbamol (ROBAXIN) tablet 500 mg, 500 mg, Oral, Q6H PRN, Tawanna Solo, Amrit, MD, 500 mg at 05/06/18 0830 .  oxyCODONE (OXYCONTIN) 12 hr tablet 15 mg, 15 mg, Oral, Q12H, Ennever, Peter R, MD .  polyethylene glycol (MIRALAX / GLYCOLAX) packet 17 g, 17 g, Oral, BID, Dhungel, Nishant, MD .  spironolactone (ALDACTONE) tablet 25 mg, 25 mg, Oral, Daily, Adhikari, Amrit, MD, 25 mg at 05/05/18 1315 .  warfarin (COUMADIN) tablet 5 mg, 5 mg, Oral, ONCE-1800, Eudelia Bunch, RPH .  Warfarin - Pharmacist Dosing Inpatient, , Does not apply, q1800, Eudelia Bunch, RPH .  zolpidem (AMBIEN) tablet 5 mg, 5 mg, Oral, QHS, Adhikari,  Amrit, MD, 5 mg at 05/05/18 2212  Allergy:  Allergies  Allergen Reactions  . Prochlorperazine Edisylate Other (See Comments)    Nervous/ flutter/ shakes--compazine  . Adhesive [Tape] Other (See Comments)    Redness/skin peeling Redness/hives from adhesive tape( tolerates latex gloves); tolerates paper tape  . Prochlorperazine Other (See Comments)    Jitters, hyper    Social Hx:   Social History   Socioeconomic History  . Marital status: Divorced    Spouse name: Not on file  . Number of children: Not on file  . Years of education: Not on file  . Highest education level: Not on file  Occupational History    Comment: WORKS FULL TIME  Social Needs  . Financial resource strain: Not on file  . Food insecurity:    Worry: Not on file    Inability: Not on file  . Transportation needs:    Medical: Not on file    Non-medical: Not on file  Tobacco Use  . Smoking status: Current Some Day Smoker    Packs/day: 0.50    Years: 30.00    Pack years: 15.00    Types: Cigarettes    Start date: 03/28/1974  . Smokeless tobacco: Never Used  Substance and Sexual Activity  . Alcohol use: No    Alcohol/week: 0.0 standard drinks  . Drug use: No  . Sexual activity: Not on file  Lifestyle  . Physical activity:    Days per week: Not on file    Minutes per session: Not on file  . Stress: Not on file  Relationships  . Social connections:    Talks on phone: Patient refused    Gets together: Patient refused    Attends religious service: Patient refused    Active member of club or organization: Patient refused    Attends meetings of clubs or organizations: Patient refused    Relationship status: Patient refused  . Intimate partner violence:    Fear of current or ex partner: Patient refused    Emotionally abused: Patient refused    Physically abused: Patient refused    Forced sexual activity: Patient refused  Other Topics Concern  . Not on file  Social History Narrative   WORKS FULL TIME    SINGLE   TOBACCO USE-YES   IMPLANTATION OF DUAL-CHAMBER St. JUDE DEFIBRILLATOR    Past Surgical Hx:  Past Surgical History:  Procedure Laterality Date  . ABDOMINAL HYSTERECTOMY N/A 07/27/2013   Procedure: TUMOR EXCISION OF ABDOMINAL MASS;  Surgeon: Alvino Chapel, MD;  Location: [facility];  Service: Gynecology;  Laterality: N/A;  . APPENDECTOMY  1989  . BILATERAL OOPHORECTOMY  2007  . CARDIAC DEFIBRILLATOR PLACEMENT  02/06/2012   "lead change"  . CARDIAC VALVE REPLACEMENT    . CYSTOSCOPY Right 02/23/2013   Procedure: CYSTOSCOPY WITH STENT REMOVAL;  Surgeon: Alvino Chapel, MD;  Location: [facility];  Service: Gynecology;  Laterality: Right;  . IMPLANTABLE CARDIOVERTER DEFIBRILLATOR REVISION N/A 02/06/2012   Procedure: IMPLANTABLE CARDIOVERTER DEFIBRILLATOR REVISION;  Surgeon: Evans Lance, MD;  Location: [facility] CATH LAB;  Service: Cardiovascular;  Laterality: N/A;  . INSERT / REPLACE / REMOVE PACEMAKER  10/2009   DUAL-CHAMBER St. JUDE DEFIBRILLATOR  . LAPAROTOMY N/A 01/19/2013   Procedure: TUMOR DEBULKING / BOWEL RESECTION/INSERTION RIGHT UTERERAL DOUBLE J STENT/OMENTECTOMY;  Surgeon: Alvino Chapel, MD;  Location: [facility];  Service: Gynecology;  Laterality: N/A;  . MITRAL VALVE REPLACEMENT  10/21/2009   35mm Edwards Perimount bovine pericardial tissue valve - [facility]  . RIGHT/LEFT HEART CATH AND CORONARY ANGIOGRAPHY N/A 07/02/2017   Procedure: RIGHT/LEFT HEART CATH AND CORONARY ANGIOGRAPHY;  Surgeon: Belva Crome, MD;  Location: [facility] LAB;  Service: Cardiovascular;  Laterality: N/A;  . TEE WITHOUT CARDIOVERSION N/A 09/16/2017   Procedure: TRANSESOPHAGEAL ECHOCARDIOGRAM (TEE);  Surgeon: [name], MD;  Location: [place];  Service: Open Heart Surgery;  Laterality: N/A;  . THYMECTOMY  2009  . TONSILLECTOMY AND ADENOIDECTOMY  ~ 1967  . TOTAL HIP ARTHROPLASTY  05/06/2012   Procedure: TOTAL HIP ARTHROPLASTY;  Surgeon: Gearlean Alf, MD;  Location: [facility];   Service: Orthopedics;  Laterality: Right;  . TRANSCATHETER MITRAL VALVE REPLACEMENT, TRANSAPICAL N/A 09/16/2017   Procedure: TRANSCATHETER MITRAL VALVE REPLACEMENT,TRANSAPICAL;  Surgeon: [name], MD;  Location: [place];  Service: Open Heart Surgery;  Laterality: N/A;  . TRANSURETHRAL RESECTION OF BLADDER  2009   "for bladder cancer"  . TUBAL LIGATION  1993  . VAGINAL HYSTERECTOMY  2001    Past Medical Hx:  Past Medical History:  Diagnosis Date  . Arthritis    "everywhere"  . Asthma    as a child  . Automatic implantable cardioverter-defibrillator in situ    DR. Beckie Salts   . Bladder cancer [facility]) 2009   "injected medicine to get rid of it"  . Chronic systolic CHF (congestive heart failure) ([place])   . CKD (chronic kidney disease), stage III ([place])   . Coronary artery disease    a. large RV/inferior MI complicated by pseudoaneurysm s/p aneurysemectomy and CABG 10/2009 with papillary muscle rupture requiring bioprosthetic mitral valve replacement in [facility].  . Granulosa cell carcinoma of ovary ([place])    recurrent - surgical resection 2007, 2014 and 2016   . H/O thymectomy   . High cholesterol   . Hypothyroidism   . ICD (implantable cardiac defibrillator) in place   . Ischemic cardiomyopathy    a.  ICM and VT arrest 10/2009 s/p AICD (revision 01/2012 due to RV lead problem).  . Myocardial infarction ([place]) 10/14/2009  . Neuropathy    FEET AND HANDS - FROM CHEMO - BUT MUCH IMPROVED  . PAF (paroxysmal atrial fibrillation) ([place])   . Pain    JOINT PAINS AND MUSCLE ACHES ALL OVER.  Marland Kitchen Port-A-Cath in place    RIGHT UPPER CHEST  . Prosthetic valve dysfunction   . S/P mitral valve replacement with bioprosthetic valve 10/21/2009   Edwards Perimount stented bovine pericardial tissue valve, size 29 mm  . S/P valve-in-valve transcatheter mitral valve replacement 09/16/2017   29 mm Edwards Sapien 3 transcatheter heart valve placed via transapical approach  . SVT (supraventricular  tachycardia) ([place])   . Tobacco abuse   . Ventricular tachycardia ([place])     Family Hx:  Family History  Problem Relation Age of Onset  .  Heart attack Paternal Grandfather   . Heart attack Maternal Grandfather   . Stroke Maternal Grandmother   . Heart attack Brother     CT AP 05/04/18: 1. The right para colic nodule/mass and the multiple pelvic masses are similar in the interval. Slight differences in measurement could be due to difference in slice selection. Recommend continued attention on follow-up. 2. Mild left hydronephrosis, new in the interval, likely due to compression of the distal left ureter by 1 or more of the pelvic masses. No obstruction on the right. 3. Suspicion of a new nodule in the hepatic dome as above. Recommend attention on follow-up. Alternatively, an MRI could further assess if clinically warranted. 4. Atherosclerotic changes in the nonaneurysmal aorta. 5. Stable left effusion and platelike scarring/atelectasis.  Previous CT AP done 05/01/18.  Vitals:  Blood pressure 101/74, pulse 74, temperature 97.7 F (36.5 C), resp. rate 20, height 5\' 6"  (1.676 m), weight 197 lb (89.4 kg), SpO2 96 %.  Physical Exam:  General: Well developed, well nourished female in no distress but appears uncomfortable. Alert and oriented x 3.  Cardiovascular: Regular rate and rhythm. S1 and S2 normal.  Lungs: Clear to auscultation bilaterally. No wheezes/crackles/rhonchi noted.  Skin: No rashes or lesions present. Back: No CVA tenderness.  Abdomen: Abdomen soft, non-tender and obese. Active bowel sounds in all quadrants.  Extremities: No bilateral cyanosis, edema, or clubbing.   Assessment/Plan: 57 year old female with recurrent granulosa cell tumor with evidence of progression on CT.  Current situation discussed with Dr. Fermin Schwab at [facility].  He feels that surgery is not an option at this time due to the extent and location of her disease stating if surgery was an option, pelvic  exenteration may be needed at a minimum. Consideration of radiation consultation for possible external beam per Dr. C-P.  Alternative options include Taxotere and Avastin or lupron injection due to strong 3+ progesterone status per Dr. Fermin Schwab. Our office will reach out to Dr. Gery Pray to see if external beam would be an option.  Dr. Gerarda Fraction with GYN Oncology to see patient later today.    Dorothyann Gibbs, NP 05/06/2018, 9:56 AM  Patient seen and examined. Agree with above. Radiation has been by already to discuss treatment with the patient.  We discussed that surgery even in the future unlikely. Possibly recommend treatment after radiation with chemo and/or antihormonal (Lupron) therapy. Although she tells me she progressed on Megace/Tamoxifen combination in the past. We will plan for her to see Dr.Clarke-Pearson next time he is in [place].

## 2018-05-06 NOTE — Progress Notes (Signed)
[place] for LMWH and heparin Indication: MVR bridge therapy  Allergies  Allergen Reactions  . Prochlorperazine Edisylate Other (See Comments)    Nervous/ flutter/ shakes--compazine  . Adhesive [Tape] Other (See Comments)    Redness/skin peeling Redness/hives from adhesive tape( tolerates latex gloves); tolerates paper tape  . Prochlorperazine Other (See Comments)    Jitters, hyper    Patient Measurements: Height: 5\' 6"  (167.6 cm) Weight: 197 lb (89.4 kg) IBW/kg (Calculated) : 59.3 Heparin Dosing Weight: 78.7 kg  Vital Signs: Temp: 97.7 F (36.5 C) (11/13 0434) BP: 109/73 (11/13 0434) Pulse Rate: 76 (11/13 0434)  Labs: Recent Labs    05/04/18 2221 05/04/18 2245 05/05/18 1250 05/06/18 0539  HGB 11.7*  --   --  11.5*  HCT 35.3*  --   --  36.2  PLT 195  --   --  194  LABPROT  --   --  19.0* 22.9*  INR  --   --  1.61 2.05  CREATININE  --  1.03* 0.96 0.92    Estimated Creatinine Clearance: 75 mL/min (by C-G formula based on SCr of 0.92 mg/dL).   Assessment: 57 yo F on coumadin/LMWH bridge PTA for transcatheter mitral valve in valve replacement and PAF.   Home dose is 5 mg qd X 7.5 MWF, on LMWH 80 q12 ended 11/11. Last dose warf 11/11 at 1800 INR 1.61 on admissionwhich is below coumadin clinic goal of 2-3.  Hg 11.7, PLTC 195. Wt 78.7 11/11 > 89.4 11/12. 05/06/2018  INR 2.05 which is therapeutic. CBC stable.  No bleeding reported.  Ennever consulting gyn oncology for possible debulking surgery.   Goal of Therapy:  INR 2-3 Monitor platelets by anticoagulation protocol: Yes   Plan:  f/u Gyn oncology consult recs If surgery planned will need to hold coumadin, if no surgery planned may be able to DC [facility] as INR is therapeutic LMWH 80 mg sq q12h Coumadin 5 mg po x 1 dose today Daily INR  Eudelia Bunch, Pharm.D [phone] 05/06/2018 7:30 AM

## 2018-05-06 NOTE — Consult Note (Signed)
Referral MD  Reason for Referral: Metastatic granulosa cell tumor  Chief Complaint  Patient presents with  . Mass  : I have had a lot of pain.  HPI: Ms. Conradt is well-known to me.  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  She is had multiple surgeries for this.  She is had systemic chemotherapy.  She has had surgeries down to [facility].  Her last surgery I think was back in 2017.  She has not been seen by [place] for quite a while.  She saw [place] because she is having worsening abdominal pain.  We went ahead and did scans on her.  To no surprise, the scans show that her tumor had recurred again.  She now is having more issues with pain.  Despite various pain regimens, she has not had any relief.  Para she had a biopsy of 1 of the recurrences on Friday.  This is positive for granulosa cell tumor.  She had to be admitted on 05/05/2018 as she is having more the way of pain.  Oral medications were not helping her.  She had another CT scan done.  This was done on 05/04/2018.  The CT scan showed right paracolic mass and multiple pelvic masses.  There was a new left hydronephrosis.  There is a possible new nodule in the hepatic dome.  She was admitted.  She is only getting low doses of Dilaudid.  She clearly needs something more aggressive.  I suspect that there is probably a neuropathic component to her pain.  I will put her on OxyContin.  I will also start her on some gabapentin.  I really believe that surgery should try to be done to help alleviate her pain.  I think if she can be debulked again, this would be the best option for her.  I would make sure that 1 of our gynecologic oncologist sees her today and determine if exploratory surgery is an option.  Otherwise, she seems to be doing fairly well.  Her appetite is okay.  She is had no problems with bowels or bladder.  She has had no bleeding.  She has had no  fever.  Currently, her performance status is ECOG 1.     Past Medical History:  Diagnosis Date  . Arthritis    "everywhere"  . Asthma    as a child  . Automatic implantable cardioverter-defibrillator in situ    DR. Beckie Salts   . Bladder cancer [facility]) 2009   "injected medicine to get rid of it"  . Chronic systolic CHF (congestive heart failure) ([place])   . CKD (chronic kidney disease), stage III ([place])   . Coronary artery disease    a. large RV/inferior MI complicated by pseudoaneurysm s/p aneurysemectomy and CABG 10/2009 with papillary muscle rupture requiring bioprosthetic mitral valve replacement in [facility].  . Granulosa cell carcinoma of ovary ([place])    recurrent - surgical resection 2007, 2014 and 2016   . H/O thymectomy   . High cholesterol   . Hypothyroidism   . ICD (implantable cardiac defibrillator) in place   . Ischemic cardiomyopathy    a.  ICM and VT arrest 10/2009 s/p AICD (revision 01/2012 due to RV lead problem).  . Myocardial infarction ([place]) 10/14/2009  . Neuropathy    FEET AND HANDS - FROM CHEMO - BUT MUCH IMPROVED  . PAF (paroxysmal atrial fibrillation) ([place])   . Pain  JOINT PAINS AND MUSCLE ACHES ALL OVER.  Marland Kitchen Port-A-Cath in place    RIGHT UPPER CHEST  . Prosthetic valve dysfunction   . S/P mitral valve replacement with bioprosthetic valve 10/21/2009   Edwards Perimount stented bovine pericardial tissue valve, size 29 mm  . S/P valve-in-valve transcatheter mitral valve replacement 09/16/2017   29 mm Edwards Sapien 3 transcatheter heart valve placed via transapical approach  . SVT (supraventricular tachycardia) ([place])   . Tobacco abuse   . Ventricular tachycardia ([place])   :  Past Surgical History:  Procedure Laterality Date  . ABDOMINAL HYSTERECTOMY N/A 07/27/2013   Procedure: TUMOR EXCISION OF ABDOMINAL MASS;  Surgeon: Alvino Chapel, MD;  Location: [facility];  Service: Gynecology;  Laterality: N/A;  . APPENDECTOMY  1989  . BILATERAL OOPHORECTOMY   2007  . CARDIAC DEFIBRILLATOR PLACEMENT  02/06/2012   "lead change"  . CARDIAC VALVE REPLACEMENT    . CYSTOSCOPY Right 02/23/2013   Procedure: CYSTOSCOPY WITH STENT REMOVAL;  Surgeon: Alvino Chapel, MD;  Location: [facility];  Service: Gynecology;  Laterality: Right;  . IMPLANTABLE CARDIOVERTER DEFIBRILLATOR REVISION N/A 02/06/2012   Procedure: IMPLANTABLE CARDIOVERTER DEFIBRILLATOR REVISION;  Surgeon: Evans Lance, MD;  Location: [facility] CATH LAB;  Service: Cardiovascular;  Laterality: N/A;  . INSERT / REPLACE / REMOVE PACEMAKER  10/2009   DUAL-CHAMBER St. JUDE DEFIBRILLATOR  . LAPAROTOMY N/A 01/19/2013   Procedure: TUMOR DEBULKING / BOWEL RESECTION/INSERTION RIGHT UTERERAL DOUBLE J STENT/OMENTECTOMY;  Surgeon: Alvino Chapel, MD;  Location: [facility];  Service: Gynecology;  Laterality: N/A;  . MITRAL VALVE REPLACEMENT  10/21/2009   51mm Edwards Perimount bovine pericardial tissue valve - [facility]  . RIGHT/LEFT HEART CATH AND CORONARY ANGIOGRAPHY N/A 07/02/2017   Procedure: RIGHT/LEFT HEART CATH AND CORONARY ANGIOGRAPHY;  Surgeon: Belva Crome, MD;  Location: [place] CV LAB;  Service: Cardiovascular;  Laterality: N/A;  . TEE WITHOUT CARDIOVERSION N/A 09/16/2017   Procedure: TRANSESOPHAGEAL ECHOCARDIOGRAM (TEE);  Surgeon: [name], MD;  Location: [place];  Service: Open Heart Surgery;  Laterality: N/A;  . THYMECTOMY  2009  . TONSILLECTOMY AND ADENOIDECTOMY  ~ 1967  . TOTAL HIP ARTHROPLASTY  05/06/2012   Procedure: TOTAL HIP ARTHROPLASTY;  Surgeon: Gearlean Alf, MD;  Location: [facility];  Service: Orthopedics;  Laterality: Right;  . TRANSCATHETER MITRAL VALVE REPLACEMENT, TRANSAPICAL N/A 09/16/2017   Procedure: TRANSCATHETER MITRAL VALVE REPLACEMENT,TRANSAPICAL;  Surgeon: [name], MD;  Location: [place];  Service: Open Heart Surgery;  Laterality: N/A;  . TRANSURETHRAL RESECTION OF BLADDER  2009   "for bladder cancer"  . TUBAL LIGATION  1993  . VAGINAL HYSTERECTOMY   2001  :   Current Facility-Administered Medications:  .  acetaminophen (TYLENOL) tablet 1,000 mg, 1,000 mg, Oral, Q6H PRN, Shelly Coss, MD, 1,000 mg at 05/06/18 0428 .  albuterol (PROVENTIL) (2.5 MG/3ML) 0.083% nebulizer solution 3 mL, 3 mL, Inhalation, Q4H PRN, Adhikari, Amrit, MD .  aspirin EC tablet 81 mg, 81 mg, Oral, Daily, Adhikari, Amrit, MD, 81 mg at 05/05/18 1315 .  carvedilol (COREG) tablet 3.125 mg, 3.125 mg, Oral, BID WC, Adhikari, Amrit, MD, 3.125 mg at 05/05/18 1725 .  enoxaparin (LOVENOX) injection 80 mg, 80 mg, Subcutaneous, Q12H, Eudelia Bunch, RPH, 80 mg at 05/06/18 0630 .  furosemide (LASIX) tablet 20 mg, 20 mg, Oral, Daily, Adhikari, Amrit, MD, 20 mg at 05/05/18 1315 .  gabapentin (NEURONTIN) capsule 300 mg, 300 mg, Oral, TID, Hurman Ketelsen, Rudell Cobb, MD .  HYDROmorphone (DILAUDID) injection  3 mg, 3 mg, Intravenous, Q2H PRN, Volanda Napoleon, MD .  LORazepam (ATIVAN) tablet 0.5 mg, 0.5 mg, Oral, Q6H PRN, Shelly Coss, MD, 0.5 mg at 05/06/18 0224 .  methocarbamol (ROBAXIN) tablet 500 mg, 500 mg, Oral, Q6H PRN, Shelly Coss, MD, 500 mg at 05/06/18 0218 .  oxyCODONE (OXYCONTIN) 12 hr tablet 15 mg, 15 mg, Oral, Q12H, Pleshette Tomasini R, MD .  polyethylene glycol (MIRALAX / GLYCOLAX) packet 17 g, 17 g, Oral, Daily, Adhikari, Amrit, MD .  spironolactone (ALDACTONE) tablet 25 mg, 25 mg, Oral, Daily, Adhikari, Amrit, MD, 25 mg at 05/05/18 1315 .  Warfarin - Pharmacist Dosing Inpatient, , Does not apply, q1800, Eudelia Bunch, RPH .  zolpidem (AMBIEN) tablet 5 mg, 5 mg, Oral, QHS, Adhikari, Amrit, MD, 5 mg at 05/05/18 2212:  . aspirin EC  81 mg Oral Daily  . carvedilol  3.125 mg Oral BID WC  . enoxaparin (LOVENOX) injection  80 mg Subcutaneous Q12H  . furosemide  20 mg Oral Daily  . gabapentin  300 mg Oral TID  . oxyCODONE  15 mg Oral Q12H  . polyethylene glycol  17 g Oral Daily  . spironolactone  25 mg Oral Daily  . Warfarin - Pharmacist Dosing Inpatient   Does not  apply q1800  . zolpidem  5 mg Oral QHS  :  Allergies  Allergen Reactions  . Prochlorperazine Edisylate Other (See Comments)    Nervous/ flutter/ shakes--compazine  . Adhesive [Tape] Other (See Comments)    Redness/skin peeling Redness/hives from adhesive tape( tolerates latex gloves); tolerates paper tape  . Prochlorperazine Other (See Comments)    Jitters, hyper  :  Family History  Problem Relation Age of Onset  . Heart attack Paternal Grandfather   . Heart attack Maternal Grandfather   . Stroke Maternal Grandmother   . Heart attack Brother   :  Social History   Socioeconomic History  . Marital status: Divorced    Spouse name: Not on file  . Number of children: Not on file  . Years of education: Not on file  . Highest education level: Not on file  Occupational History    Comment: WORKS FULL TIME  Social Needs  . Financial resource strain: Not on file  . Food insecurity:    Worry: Not on file    Inability: Not on file  . Transportation needs:    Medical: Not on file    Non-medical: Not on file  Tobacco Use  . Smoking status: Current Some Day Smoker    Packs/day: 0.50    Years: 30.00    Pack years: 15.00    Types: Cigarettes    Start date: 03/28/1974  . Smokeless tobacco: Never Used  Substance and Sexual Activity  . Alcohol use: No    Alcohol/week: 0.0 standard drinks  . Drug use: No  . Sexual activity: Not on file  Lifestyle  . Physical activity:    Days per week: Not on file    Minutes per session: Not on file  . Stress: Not on file  Relationships  . Social connections:    Talks on phone: Patient refused    Gets together: Patient refused    Attends religious service: Patient refused    Active member of club or organization: Patient refused    Attends meetings of clubs or organizations: Patient refused    Relationship status: Patient refused  . Intimate partner violence:    Fear of current or ex partner: Patient refused  Emotionally abused:  Patient refused    Physically abused: Patient refused    Forced sexual activity: Patient refused  Other Topics Concern  . Not on file  Social History Narrative   WORKS FULL TIME   SINGLE   TOBACCO USE-YES   IMPLANTATION OF DUAL-CHAMBER St. JUDE DEFIBRILLATOR  :  Pertinent items are noted in HPI.  Exam: As above Patient Vitals for the past 24 hrs:  BP Temp Temp src Pulse Resp SpO2  05/06/18 0434 109/73 97.7 F (36.5 C) - 76 20 96 %  05/05/18 2007 (!) 96/55 98.4 F (36.9 C) - 77 20 98 %  05/05/18 1153 123/69 97.7 F (36.5 C) Oral 77 20 99 %  05/05/18 0745 101/87 98.5 F (36.9 C) Oral 89 18 100 %     Recent Labs    05/04/18 2221 05/06/18 0539  WBC 14.8* 10.1  HGB 11.7* 11.5*  HCT 35.3* 36.2  PLT 195 194   Recent Labs    05/05/18 1250 05/06/18 0539  NA 138 137  K 3.1* 3.6  CL 101 101  CO2 26 27  GLUCOSE 93 104*  BUN 10 10  CREATININE 0.96 0.92  CALCIUM 8.8* 8.8*    Blood smear review: None  Pathology: As above    Assessment and Plan: Ms. Griffitts is a 57 year old white female.  She has another recurrence of her granulosa cell tumor.  Of note, the tumor marker that we use the inhibin B level.  We will saw her back in September the level was over 1600.  I think this is a very good indicator of her tumor burden.  Again, I would really prefer to have surgery involved to see if they can debulk her again.  I really think this is going to be the best way to try to alleviate her pain.  We can then utilize chemotherapy in the "adjuvant" setting to try to help minimize recurrence.  I did send the biopsy off for molecular profiling.  It would take about 10 days before we get this back.  I will start her on long-acting pain medication.  I will also utilize gabapentin.  We have to get her pain under better control.  She is just not functional at all right now.  Again, I would consult gynecologic oncology.  I would see if Dr. Everitt Amber can see her.  We will follow  along.  I do not know if urology needs to see her for the hydronephrosis with her left kidney.  I know her renal function is not bad.  However, a ureteral stent might be indicated.  Lattie Haw, MD  Darlyn Chamber 33:3

## 2018-05-07 ENCOUNTER — Ambulatory Visit
Admit: 2018-05-07 | Discharge: 2018-05-07 | Disposition: A | Discharge status: Home / Self Care | Payer: BLUE CROSS/BLUE SHIELD | Attending: Radiation Oncology | Admitting: Radiation Oncology

## 2018-05-07 ENCOUNTER — Ambulatory Visit
Admit: 2018-05-07 | Discharge: 2018-05-07 | Disposition: A | Discharge status: Home / Self Care | Payer: BLUE CROSS/BLUE SHIELD | Source: Ambulatory Visit | Attending: Radiation Oncology | Admitting: Radiation Oncology

## 2018-05-07 DIAGNOSIS — D3911 Neoplasm of uncertain behavior of right ovary: Secondary | ICD-10-CM

## 2018-05-07 LAB — PROTIME-INR
INR: 2.29
PROTHROMBIN TIME: 24.9 s — AB (ref 11.4–15.2)

## 2018-05-07 MED ORDER — LEUPROLIDE ACETATE 3.75 MG IM KIT: 3.7500 mg | PACK | Freq: Once | INTRAMUSCULAR | Status: DC

## 2018-05-07 MED ORDER — LEUPROLIDE ACETATE 7.5 MG IM KIT
7.5000 mg | PACK | Freq: Once | INTRAMUSCULAR | Status: AC
  Administered 2018-05-08: 7.5 mg via INTRAMUSCULAR
  Filled 2018-05-07 (×2): qty 7.5

## 2018-05-07 MED ORDER — HYDROMORPHONE HCL 2 MG PO TABS
2.0000 mg | ORAL_TABLET | ORAL | Status: DC | PRN
  Administered 2018-05-07 – 2018-05-15 (×23): 2 mg via ORAL
  Filled 2018-05-07 (×25): qty 1

## 2018-05-07 MED ORDER — WARFARIN SODIUM 5 MG PO TABS
5.0000 mg | ORAL_TABLET | Freq: Once | ORAL | Status: AC
  Administered 2018-05-07: 5 mg via ORAL
  Filled 2018-05-07: qty 1

## 2018-05-07 MED ORDER — LEUPROLIDE ACETATE 7.5 MG IM KIT
7.5000 mg | PACK | Freq: Once | INTRAMUSCULAR | Status: DC
  Filled 2018-05-07: qty 7.5

## 2018-05-07 NOTE — Progress Notes (Signed)
Patient resting quietly in bed in no acute distress.  She met with Dr. Sondra Come, Radiation Oncologist, yesterday and is planning on having her first radiation treatment today.  She states her pain seems to be slightly improved.  She still feels like she is unable to get her urine out completely with urination. No dysuria reported. She is asking about increasing the dose of the oxycontin. No other concerns voiced.  Advised GYN ONC will continue to follow.  We will arrange for her to meet with Dr. Fermin Schwab in the office as an outpatient when he is next in [facility] (Nov 26) for follow up. Advised her to contact the office for any needs.

## 2018-05-07 NOTE — Progress Notes (Signed)
[place] for LMWH and heparin Indication: MVR bridge therapy  Allergies  Allergen Reactions  . Prochlorperazine Edisylate Other (See Comments)    Nervous/ flutter/ shakes--compazine  . Adhesive [Tape] Other (See Comments)    Redness/skin peeling Redness/hives from adhesive tape( tolerates latex gloves); tolerates paper tape  . Prochlorperazine Other (See Comments)    Jitters, hyper    Patient Measurements: Height: 5\' 6"  (167.6 cm) Weight: 197 lb (89.4 kg) IBW/kg (Calculated) : 59.3 Heparin Dosing Weight: 78.7 kg  Vital Signs: Temp: 98.2 F (36.8 C) (11/13 2119) Temp Source: Oral (11/13 2119) BP: 95/57 (11/13 2119) Pulse Rate: 81 (11/13 2119)  Labs: Recent Labs    05/04/18 2221 05/04/18 2245 05/05/18 1250 05/06/18 0539 05/07/18 0543  HGB 11.7*  --   --  11.5*  --   HCT 35.3*  --   --  36.2  --   PLT 195  --   --  194  --   LABPROT  --   --  19.0* 22.9* 24.9*  INR  --   --  1.61 2.05 2.29  CREATININE  --  1.03* 0.96 0.92  --     Estimated Creatinine Clearance: 75 mL/min (by C-G formula based on SCr of 0.92 mg/dL).   Assessment: 57 yo F on coumadin/LMWH bridge PTA for transcatheter mitral valve in valve replacement and PAF.   Home dose is 5 mg qd X 7.5 MWF, on LMWH 80 q12 ended 11/11. Last dose warf 11/11 at 1800 INR 1.61 on admissionwhich is below coumadin clinic goal of 2-3.  Hg 11.7, PLTC 195. Wt 78.7 11/11 > 89.4 11/12.  05/07/2018  INR 2.29 therapeutic. CBC stable.  No bleeding reported. Gyn oncology rec no debulking surgery.   Goal of Therapy:  INR 2-3 Monitor platelets by anticoagulation protocol: Yes   Plan:  DC [place] as INR is therapeutic and no surgery planned Coumadin 5 mg po x 1 dose today Daily INR  Eudelia Bunch, Pharm.D [phone] 05/07/2018 7:23 AM

## 2018-05-07 NOTE — Care Management Note (Signed)
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  She had a radical debulking along with rectosigmoid resection with low rectal anastomosis, right distal ureter resection, ureteroneocystostom and omentectomy in 2014 followed by excellent chemotherapy.  She had exploratory laparotomy with radical resection of recurrent tumor in 2017.  Patient now following with Dr. Marin Olp and presenting with severe persistent abdominal pain.  Patient had a recent retroperitoneal soft tissue biopsy done on 11/8 showing granulosa cell tumor and CT of the abdomen showing multiple pelvic masses and mild left hydronephrosis with suspicious new nodule in the hepatic dome. Patient admitted for adequate pain control.  Action/Plan: Following for progression of care. Following for cm needs none present at this time.  Expected Discharge Date:  (unknown)               Expected Discharge Plan:  Home/Self Care  In-House Referral:     Discharge planning Services  CM Consult  Post Acute Care Choice:    Choice offered to:     DME Arranged:    DME Agency:     HH Arranged:    HH Agency:     Status of Service:  In process, will continue to follow  If discussed at Long Length of Stay Meetings, dates discussed:    Additional Comments:  Leeroy Cha, RN 05/07/2018, 11:13 AM

## 2018-05-07 NOTE — Progress Notes (Signed)
  Radiation Oncology         [phone]) [phone] ________________________________  Name: KATESHA EICHEL MRN: [id number]  Date: 05/07/2018  DOB: 1961-03-30  SIMULATION AND TREATMENT PLANNING NOTE -IN-PATIENT    ICD-10-CM   1. Granulosa cell tumor of right ovary D39.11     DIAGNOSIS:  recurrent granulosa cell tumor of the right ovary  NARRATIVE:  The patient was brought to the [facility].  Identity was confirmed.  All relevant records and images related to the planned course of therapy were reviewed.  The patient freely provided informed written consent to proceed with treatment after reviewing the details related to the planned course of therapy. The consent form was witnessed and verified by the simulation staff.  Then, the patient was set-up in a stable reproducible  supine position for radiation therapy.  CT images were obtained.  Surface markings were placed.  The CT images were loaded into the planning software.  Then the target and avoidance structures were contoured.  Treatment planning then occurred.  The radiation prescription was entered and confirmed.  Then, I designed and supervised the construction of a total of 5 medically necessary complex treatment devices.  I have requested : 3D Simulation  I have requested a DVH of the following structures: GTV, PTV, bladder, rectum, bowel..  I have ordered:dose calc  PLAN:  The patient will receive 45 Gy in 25 fractions.  The patient will receive her first radiation therapy today as an inpatient. Once she is medically stable she will continue her radiation therapy as an outpatient.  -----------------------------------  Blair Promise, PhD, MD

## 2018-05-07 NOTE — ED Provider Notes (Signed)
[facility] Provider Note   CSN: [id number] Arrival date & time: 05/01/18  1307     History   Chief Complaint Chief Complaint  Patient presents with  . Tailbone Pain    HPI [name] is a 57 y.o. female.   Back Pain   This is a chronic problem. The problem occurs constantly. The problem has been gradually worsening. The pain is associated with no known injury. Pain location: sacral pain right sided above gluteal cleft. The quality of the pain is described as stabbing and shooting. The pain does not radiate. The pain is moderate. The symptoms are aggravated by certain positions. Pertinent negatives include no chest pain and no fever.    Past Medical History:  Diagnosis Date  . Arthritis    "everywhere"  . Asthma    as a child  . Automatic implantable cardioverter-defibrillator in situ    DR. Beckie Salts   . Bladder cancer [facility]) 2009   "injected medicine to get rid of it"  . Chronic systolic CHF (congestive heart failure) ([place])   . CKD (chronic kidney disease), stage III ([place])   . Coronary artery disease    a. large RV/inferior MI complicated by pseudoaneurysm s/p aneurysemectomy and CABG 10/2009 with papillary muscle rupture requiring bioprosthetic mitral valve replacement in [facility].  . 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 H/O thymectomy   . High cholesterol   . Hypothyroidism   . ICD (implantable cardiac defibrillator) in place   . Ischemic cardiomyopathy    a.  ICM and VT arrest 10/2009 s/p AICD (revision 01/2012 due to RV lead problem).  . Myocardial infarction ([place]) 10/14/2009  . Neuropathy    FEET AND HANDS - FROM CHEMO - BUT MUCH IMPROVED  . PAF (paroxysmal atrial fibrillation) ([place])   . Pain    JOINT PAINS AND MUSCLE ACHES ALL OVER.  Marland Kitchen Port-A-Cath in place    RIGHT UPPER CHEST  . Prosthetic valve dysfunction   . S/P mitral valve replacement with bioprosthetic valve  10/21/2009   Edwards Perimount stented bovine pericardial tissue valve, size 29 mm  . S/P valve-in-valve transcatheter mitral valve replacement 09/16/2017   29 mm Edwards Sapien 3 transcatheter heart valve placed via transapical approach  . SVT (supraventricular tachycardia) ([facility])   . Tobacco abuse   . Ventricular tachycardia [facility])     Patient Active Problem List   Diagnosis Date Noted  . Intractable pain 05/01/2018  . Hypokalemia 05/01/2018  . Encounter for therapeutic drug monitoring 09/22/2017  . S/P valve-in-valve transcatheter mitral valve replacement 09/16/2017  . Prosthetic valve dysfunction   . Stenosis of prosthetic mitral valve   . Dyspnea 06/04/2017  . Abdominal pain   . Pancolitis ([place]) 06/03/2017  . Paroxysmal atrial fibrillation ([place]) 09/02/2016  . COPD (chronic obstructive pulmonary disease) ([place]) 09/02/2016  . Hypoxia 05/28/2015  . Acute bronchitis 05/28/2015  . CKD (chronic kidney disease) stage 3, GFR 30-59 ml/min ([facility]) 05/28/2015  . Chronic anemia 05/28/2015  . Anxiety 03/08/2014  . Granulosa cell tumor of ovary 01/20/2013  . OA (osteoarthritis) of hip 05/06/2012  . Automatic implantable cardioverter-defibrillator in situ 11/08/2009  . Athscl autologous vein CABG w oth angina pectoris ([place]) 11/07/2009  . VENTRICULAR TACHYCARDIA 11/07/2009  . VENTRICULAR FIBRILLATION 11/07/2009  . Chronic systolic CHF (congestive heart failure) ([place]) 11/07/2009  . S/P mitral valve replacement with bioprosthetic valve 10/21/2009  Past Surgical History:  Procedure Laterality Date  . ABDOMINAL HYSTERECTOMY N/A 07/27/2013   Procedure: TUMOR EXCISION OF ABDOMINAL MASS;  Surgeon: Alvino Chapel, MD;  Location: [facility];  Service: Gynecology;  Laterality: N/A;  . APPENDECTOMY  1989  . BILATERAL OOPHORECTOMY  2007  . CARDIAC DEFIBRILLATOR PLACEMENT  02/06/2012   "lead change"  . CARDIAC VALVE REPLACEMENT    . CYSTOSCOPY Right 02/23/2013   Procedure: CYSTOSCOPY WITH STENT  REMOVAL;  Surgeon: Alvino Chapel, MD;  Location: [facility];  Service: Gynecology;  Laterality: Right;  . IMPLANTABLE CARDIOVERTER DEFIBRILLATOR REVISION N/A 02/06/2012   Procedure: IMPLANTABLE CARDIOVERTER DEFIBRILLATOR REVISION;  Surgeon: Evans Lance, MD;  Location: [facility] CATH LAB;  Service: Cardiovascular;  Laterality: N/A;  . INSERT / REPLACE / REMOVE PACEMAKER  10/2009   DUAL-CHAMBER [place]. JUDE DEFIBRILLATOR  . LAPAROTOMY N/A 01/19/2013   Procedure: TUMOR DEBULKING / BOWEL RESECTION/INSERTION RIGHT UTERERAL DOUBLE J STENT/OMENTECTOMY;  Surgeon: Alvino Chapel, MD;  Location: [facility];  Service: Gynecology;  Laterality: N/A;  . MITRAL VALVE REPLACEMENT  10/21/2009   33mm Edwards Perimount bovine pericardial tissue valve - [facility]  . RIGHT/LEFT HEART CATH AND CORONARY ANGIOGRAPHY N/A 07/02/2017   Procedure: RIGHT/LEFT HEART CATH AND CORONARY ANGIOGRAPHY;  Surgeon: Belva Crome, MD;  Location: [facility] CV LAB;  Service: Cardiovascular;  Laterality: N/A;  . TEE WITHOUT CARDIOVERSION N/A 09/16/2017   Procedure: TRANSESOPHAGEAL ECHOCARDIOGRAM (TEE);  Surgeon: [name], MD;  Location: [facility];  Service: Open Heart Surgery;  Laterality: N/A;  . THYMECTOMY  2009  . TONSILLECTOMY AND ADENOIDECTOMY  ~ 1967  . TOTAL HIP ARTHROPLASTY  05/06/2012   Procedure: TOTAL HIP ARTHROPLASTY;  Surgeon: Gearlean Alf, MD;  Location: [facility];  Service: Orthopedics;  Laterality: Right;  . TRANSCATHETER MITRAL VALVE REPLACEMENT, TRANSAPICAL N/A 09/16/2017   Procedure: TRANSCATHETER MITRAL VALVE REPLACEMENT,TRANSAPICAL;  Surgeon: [name], MD;  Location: [place] [facility];  Service: Open Heart Surgery;  Laterality: N/A;  . TRANSURETHRAL RESECTION OF BLADDER  2009   "for bladder cancer"  . TUBAL LIGATION  1993  . VAGINAL HYSTERECTOMY  2001     OB History   None      Home Medications    Prior to Admission medications   Medication Sig Start Date End Date Taking? Authorizing Provider    albuterol (PROVENTIL HFA;VENTOLIN HFA) 108 (90 Base) MCG/ACT inhaler Inhale 2 puffs into the lungs every 4 (four) hours as needed for wheezing or shortness of breath. 06/06/17  Yes Eugenie Filler, MD  aspirin EC 81 MG EC tablet Take 1 tablet (81 mg total) by mouth daily. 09/19/17  Yes Gold, Patrick Jupiter E, PA-C  b complex vitamins tablet Take 1 tablet by mouth 2 (two) times daily.    Yes [provider]  carvedilol (COREG) 3.125 MG tablet Take 1 tablet (3.125 mg total) by mouth 2 (two) times daily with a meal. 10/06/17  Yes Lars Pinks M, PA-C  cholecalciferol (VITAMIN D-400) 400 UNITS TABS tablet Take 400 Units by mouth 2 (two) times daily.   Yes [provider]  diphenhydrAMINE (BENADRYL) 25 MG tablet Take 25 mg by mouth every 6 (six) hours as needed for itching or allergies.    Yes [provider]  enoxaparin (LOVENOX) 80 MG/0.8ML injection Inject 0.8 mLs (80 mg total) into the skin every 12 (twelve) hours. 04/24/18  Yes Belva Crome, MD  furosemide (LASIX) 20 MG tablet TAKE 1 TAB BY MOUTH DAILY. MAY TAKE AN EXTRA  TAB AS NEEDED FOR LEG SWELLING OR SHORTNESS OF BREATH Patient taking differently: Take 20 mg by mouth daily.  10/29/17  Yes Evans Lance, MD  spironolactone (ALDACTONE) 25 MG tablet Take 1 tablet (25 mg total) by mouth daily. 06/11/17 06/06/18 Yes Belva Crome, MD  Vitamin D, Ergocalciferol, (DRISDOL) 50000 units CAPS capsule TAKE 1 CAPSULE (50,000 UNITS TOTAL) BY MOUTH EVERY 7 (SEVEN) DAYS. Patient taking differently: Take 50,000 Units by mouth every Thursday.  10/23/17  Yes Ennever, Rudell Cobb, MD  zolpidem (AMBIEN) 5 MG tablet TAKE 1 TABLET BY MOUTH EVERYDAY AT BEDTIME Patient taking differently: Take 5 mg by mouth at bedtime.  02/16/18  Yes Volanda Napoleon, MD  acetaminophen (TYLENOL) 500 MG tablet Take 2 tablets (1,000 mg total) by mouth every 6 (six) hours as needed for moderate pain or headache. 05/02/18   Geradine Girt, DO  HYDROmorphone (DILAUDID)  2 MG tablet Take 2 mg by mouth every 4 (four) hours.    [provider]  HYDROmorphone (DILAUDID) 4 MG tablet Take 1 tablet (4 mg total) by mouth every 4 (four) hours as needed for severe pain. 05/05/18   Jola Schmidt, MD  LORazepam (ATIVAN) 0.5 MG tablet Take 1 tablet (0.5 mg total) by mouth every 6 (six) hours as needed (for nausea and vomiting). 05/02/18   Geradine Girt, DO  warfarin (COUMADIN) 5 MG tablet Take 1-1.5 tablets (5-7.5 mg total) by mouth See admin instructions. Take 7.5 mg by mouth daily on Monday, Wednesday and Friday. Take 5 mg by mouth daily on all other days. 05/02/18   Geradine Girt, DO    Family History Family History  Problem Relation Age of Onset  . Heart attack Paternal Grandfather   . Heart attack Maternal Grandfather   . Stroke Maternal Grandmother   . Heart attack Brother     Social History Social History   Tobacco Use  . Smoking status: Current Some Day Smoker    Packs/day: 0.50    Years: 30.00    Pack years: 15.00    Types: Cigarettes    Start date: 03/28/1974  . Smokeless tobacco: Never Used  Substance Use Topics  . Alcohol use: No    Alcohol/week: 0.0 standard drinks  . Drug use: No     Allergies   Prochlorperazine edisylate; Adhesive [tape]; and Prochlorperazine   Review of Systems Review of Systems  Constitutional: Negative for fever.  Cardiovascular: Negative for chest pain.  Musculoskeletal: Positive for back pain.  All other systems reviewed and are negative.    Physical Exam Updated Vital Signs BP 107/64 (BP Location: Left Arm)   Pulse 90   Temp 98.7 F (37.1 C) (Oral)   Resp 18   Ht 5\' 6"  (1.676 m)   Wt 89.4 kg   SpO2 98%   BMI 31.80 kg/m   Physical Exam  Constitutional: She is oriented to person, place, and time. She appears well-developed and well-nourished.  HENT:  Head: Normocephalic and atraumatic.  Eyes: Conjunctivae and EOM are normal.  Neck: Normal range of motion.  Cardiovascular: Normal rate  and regular rhythm.  Pulmonary/Chest: No stridor. No respiratory distress.  Abdominal: Soft. Bowel sounds are normal. She exhibits no distension.  Musculoskeletal: Normal range of motion. She exhibits tenderness (edema, ttp to right side upper gluteal cleft area no flucutnace or erythema/drainage). She exhibits no edema.  Neurological: She is alert and oriented to person, place, and time.  Skin: Skin is dry.  Nursing note and  vitals reviewed.    ED Treatments / Results  Labs (all labs ordered are listed, but only abnormal results are displayed) Labs Reviewed  BASIC METABOLIC PANEL - Abnormal; Notable for the following components:      Result Value   Glucose, Bld 117 (*)    Creatinine, Ser 1.10 (*)    Calcium 8.8 (*)    GFR calc non Af Amer 54 (*)    All other components within normal limits  CBC WITH DIFFERENTIAL/PLATELET - Abnormal; Notable for the following components:   WBC 16.7 (*)    RBC 3.70 (*)    Hemoglobin 11.1 (*)    HCT 34.2 (*)    Platelets 111 (*)    Neutro Abs 15.6 (*)    Abs Immature Granulocytes 0.08 (*)    All other components within normal limits  I-STAT CHEM 8, ED - Abnormal; Notable for the following components:   Potassium 3.1 (*)    Creatinine, Ser 1.10 (*)    Glucose, Bld 105 (*)    Calcium, Ion 1.05 (*)    Hemoglobin 11.6 (*)    HCT 34.0 (*)    All other components within normal limits  URINALYSIS, ROUTINE W REFLEX MICROSCOPIC  MAGNESIUM  PROTIME-INR    EKG None  Radiology No results found.  Procedures Procedures (including critical care time)  Medications Ordered in ED Medications  HYDROmorphone (DILAUDID) injection 1 mg (1 mg Intravenous Given 05/01/18 1435)  potassium chloride SA (K-DUR,KLOR-CON) CR tablet 40 mEq (40 mEq Oral Given 05/01/18 1632)  HYDROmorphone (DILAUDID) injection 1 mg (1 mg Intravenous Given 05/01/18 1632)  acetaminophen (TYLENOL) tablet 1,000 mg (1,000 mg Oral Given 05/01/18 1632)  iohexol (OMNIPAQUE) 300 MG/ML  solution 100 mL (100 mLs Intravenous Contrast Given 05/01/18 1803)  ketorolac (TORADOL) 15 MG/ML injection 15 mg (15 mg Intravenous Given 05/01/18 1941)  HYDROmorphone (DILAUDID) tablet 4 mg (4 mg Oral Given 05/01/18 1940)  sodium chloride 0.9 % bolus 1,000 mL (0 mLs Intravenous Stopped 05/01/18 2204)  dexamethasone (DECADRON) injection 10 mg (10 mg Intravenous Given 05/01/18 2001)  morphine 4 MG/ML injection 4 mg (4 mg Intravenous Given 05/01/18 2208)  warfarin (COUMADIN) tablet 10 mg (10 mg Oral Given 05/01/18 2302)     Initial Impression / Assessment and Plan / ED Course  I have reviewed the triage vital signs and the nursing notes.  Pertinent labs & imaging results that were available during my care of the patient were reviewed by me and considered in my medical decision making (see chart for details).     Worsening pain to known area of metastatic disease. Discussed with Dr. Marin Olp, plan for repeat CT to evaluate disease progression. Present prior to biopsy, doubt significant complication. Pain medication ordered.   Care transferred pending reevaluation fo rpain and ct results, suspect discharge.   Final Clinical Impressions(s) / ED Diagnoses   Final diagnoses:  Intractable pain    ED Discharge Orders         Ordered    acetaminophen (TYLENOL) 500 MG tablet  Every 6 hours PRN     05/02/18 1247    HYDROmorphone (DILAUDID) 2 MG tablet  Every 4 hours PRN,   Status:  Discontinued     05/02/18 1247    LORazepam (ATIVAN) 0.5 MG tablet  Every 6 hours PRN    Note to Pharmacy:  Not to exceed 5 additional fills before 05/11/2018   05/02/18 1247    warfarin (COUMADIN) 5 MG tablet  See admin instructions  Note to Pharmacy:  90 day supply   05/02/18 1247    Increase activity slowly     05/02/18 1247    Diet - low sodium heart healthy     05/02/18 1247    Discharge instructions    Comments:  Resume prior directed coumadin dose after biopsy, INR check next week Bowel regimen while on  pain medications Follow up with Dr. Elnoria Howard   05/02/18 1247           Gabrielle Mester, Corene Cornea, MD 05/07/18 1423

## 2018-05-07 NOTE — Progress Notes (Signed)
PROGRESS NOTE                                                                                                                                                                                                             Patient Demographics:    [name], is a 57 y.o. female, DOB - 01-May-1961, VZD:[id number]  Admit date - 05/04/2018   Admitting Physician Rise Patience, MD  Outpatient Primary MD for the patient is Default, Provider, MD  LOS - 1  Outpatient Specialists: Dr Mosetta Anis  Chief Complaint  Patient presents with  . 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  She had a radical debulking along with rectosigmoid resection with low rectal anastomosis, right distal ureter resection, ureteroneocystostom and omentectomy in 2014 followed by excellent chemotherapy.  She had exploratory laparotomy with radical resection of recurrent tumor in 2017.  Patient now following with Dr. Marin Olp and presenting with severe persistent abdominal pain.  Patient had a recent retroperitoneal soft tissue biopsy done on 11/8 showing granulosa cell tumor and CT of the abdomen showing multiple pelvic masses and mild left hydronephrosis with suspicious new nodule in the hepatic dome. Patient admitted for adequate pain control.    Subjective:   Pain better after Dilaudid dose adjusted yesterday.  Radiation simulation this afternoon.  Assessment  & Plan :    Principal Problem:   Intractable abdominal pain Secondary to recurrence of her granulosa cell tumor with multiple pelvic masses, mild left hydronephrosis and possible new hepatic lesion. Dilaudid does adjusted to 2 mg every 2 hours with improvement.  Also added OxyContin 15 mg twice daily.  Patient was taking 2 mg Dilaudid p.o. every 4 hours  as needed at home which have been resumed today. Continue bowel regimen. GYN oncology consulted, who after discussing with patient's surgeon (Dr. Aldean Ast) at [facility] recommended palliative radiation.  Simulation this afternoon followed by plan on 5 weeks of radiation directed at the pelvis.  Active Problems: Recurrence of granulosa cell tumor. Plan as above.  Oncology, radiation onc and GYN-onc on board.  History of chronic CHF Last echo in May 2019 with EF of 45%.  Also has history of V. tach status post  ICD.  Currently euvolemic.  Continue home diuretic.  History of mitral valve repair Following MI with postinfarction papillary muscle rupture in 2011.  INR therapeutic.  Continue Coumadin.  Lovenox bridging discontinued.  Mild left-sided hydronephrosis As seen on CT.  Monitor renal function.  Voiding without difficulty.  Chronic kidney disease stage III Stable at baseline  COPD Stable.  Continue home meds.  Hypokalemia Replenished  ?  Paroxysmal A. fib Patient denies history of it.  On Coumadin for mitral valve repair.        Code Status : Full code  Family Communication  : None at bedside  Disposition Plan  : Home tomorrow if pain adequately controlled.  Barriers For Discharge : Active symptoms  Consults  : Oncology, GYN-onc  Procedures  : CT abdomen and pelvis DVT prophylaxis: Coumadin   Lab Results  Component Value Date   PLT 194 05/06/2018    Antibiotics  :   Anti-infectives (From admission, onward)   None        Objective:   Vitals:   05/06/18 0749 05/06/18 1751 05/06/18 2119 05/07/18 0741  BP: 101/74 103/67 (!) 95/57 92/64  Pulse: 74 83 81 91  Resp:   18 18  Temp:   98.2 F (36.8 C) (!) 97.5 F (36.4 C)  TempSrc:   Oral Oral  SpO2:   97% 95%  Weight:      Height:        Wt Readings from Last 3 Encounters:  05/04/18 89.4 kg  05/01/18 89.4 kg  05/01/18 89.4 kg     Intake/Output Summary (Last 24 hours) at 05/07/2018 1057 Last  data filed at 05/07/2018 0856 Gross per 24 hour  Intake 480 ml  Output 650 ml  Net -170 ml    Physical exam next line not in distress HEENT: Moist mucosa, supple neck Chest: Clear bilaterally CVs: Normal S1 and S2, no murmurs GI: Soft, nondistended, nontender, bowel sounds present Musculoskeletal: Warm, no edema     Data Review:    CBC Recent Labs  Lab 05/01/18 0925 05/01/18 1501 05/02/18 0623 05/04/18 2221 05/06/18 0539  WBC 18.8*  --  16.7* 14.8* 10.1  HGB 12.4 11.6* 11.1* 11.7* 11.5*  HCT 38.7 34.0* 34.2* 35.3* 36.2  PLT 145*  --  111* 195 194  MCV 91.7  --  92.4 90.3 92.8  MCH 29.4  --  30.0 29.9 29.5  MCHC 32.0  --  32.5 33.1 31.8  RDW 15.1  --  15.1 14.9 15.0  LYMPHSABS  --   --  0.7  --   --   MONOABS  --   --  0.3  --   --   EOSABS  --   --  0.0  --   --   BASOSABS  --   --  0.0  --   --     Chemistries  Recent Labs  Lab 05/01/18 1501 05/02/18 0623 05/04/18 2245 05/05/18 1250 05/06/18 0539  NA 137 135 136 138 137  K 3.1* 4.3 2.9* 3.1* 3.6  CL 102 106 99 101 101  CO2  --  22 26 26 27   GLUCOSE 105* 117* 109* 93 104*  BUN 12 14 13 10 10   CREATININE 1.10* 1.10* 1.03* 0.96 0.92  CALCIUM  --  8.8* 8.8* 8.8* 8.8*  MG  --  1.7  --  1.9  --    ------------------------------------------------------------------------------------------------------------------ No results for input(s): CHOL, HDL, LDLCALC, TRIG, CHOLHDL, LDLDIRECT in the last 72 hours.  Lab Results  Component Value Date   HGBA1C 5.0 09/12/2017   ------------------------------------------------------------------------------------------------------------------ No results for input(s): TSH, T4TOTAL, T3FREE, THYROIDAB in the last 72 hours.  Invalid input(s): FREET3 ------------------------------------------------------------------------------------------------------------------ No results for input(s): VITAMINB12, FOLATE, FERRITIN, TIBC, IRON, RETICCTPCT in the last 72 hours.  Coagulation  profile Recent Labs  Lab 05/01/18 1028 05/02/18 0623 05/05/18 1250 05/06/18 0539 05/07/18 0543  INR 1.28 1.17 1.61 2.05 2.29    No results for input(s): DDIMER in the last 72 hours.  Cardiac Enzymes No results for input(s): CKMB, TROPONINI, MYOGLOBIN in the last 168 hours.  Invalid input(s): CK ------------------------------------------------------------------------------------------------------------------    Component Value Date/Time   BNP 327.3 (H) 09/12/2017 1610    Inpatient Medications  Scheduled Meds: . aspirin EC  81 mg Oral Daily  . carvedilol  3.125 mg Oral BID WC  . furosemide  20 mg Oral Daily  . gabapentin  300 mg Oral TID  . leuprolide  7.5 mg Intramuscular Once  . oxyCODONE  15 mg Oral Q12H  . polyethylene glycol  17 g Oral BID  . spironolactone  25 mg Oral Daily  . warfarin  5 mg Oral ONCE-1800  . Warfarin - Pharmacist Dosing Inpatient   Does not apply q1800  . zolpidem  5 mg Oral QHS   Continuous Infusions: PRN Meds:.acetaminophen, albuterol, alum & mag hydroxide-simeth, HYDROmorphone (DILAUDID) injection, HYDROmorphone, lidocaine, LORazepam, methocarbamol  Micro Results Recent Results (from the past 240 hour(s))  Culture, Urine     Status: Abnormal   Collection Time: 04/30/18  2:32 PM  Result Value Ref Range Status   Specimen Description   Final    URINE, CLEAN CATCH Performed at [facility] at [facility], [address], [place], [place] [address]    Special Requests   Final    URINE, CLEAN CATCH Performed at [facility] at [facility], [address], [place], [place] [address]    Culture (A)  Final    <10,000 COLONIES/mL INSIGNIFICANT GROWTH Performed at [facility], [place] [address], [place],  [address]    Report Status 05/01/2018 FINAL  Final    Radiology Reports Ct Chest W Contrast  Result Date: 04/08/2018 CLINICAL DATA:  Granulosa cell tumor of the ovary.  Elevated tumor markers. EXAM: CT CHEST, ABDOMEN, AND PELVIS WITH CONTRAST TECHNIQUE: Multidetector CT imaging of the chest, abdomen and pelvis was performed following the standard protocol during bolus administration of intravenous contrast. CONTRAST:  62mL ISOVUE-300 IOPAMIDOL (ISOVUE-300) INJECTION 61% COMPARISON:  CTA chest/abdomen/pelvis 08/26/2017. FINDINGS: CT CHEST FINDINGS Cardiovascular: The heart size is normal. No substantial pericardial effusion. Atherosclerotic calcification is noted in the wall of the thoracic aorta. Status post mitral valve replacement. Left-sided permanent pacemaker/AICD evident. Mediastinum/Nodes: No mediastinal lymphadenopathy. 15 mm short axis paratracheal lymph node measured on the prior study has decreased in the interval now measuring 7 mm. There is no hilar lymphadenopathy. The esophagus has normal imaging features. There is no axillary lymphadenopathy. Lungs/Pleura: The central tracheobronchial airways are patent. Centrilobular emphysema noted. 4 mm right perifissural nodule (71/3) is stable. 10 mm ground-glass nodule identified posterior right upper lobe on prior study is less confluent today and measures 7 mm. 4 mm ground-glass nodule identified in the left apex with additional peripheral ground-glass nodules. 6 mm left upper lobe nodule (42/3) is similar to prior. Interval development of subpleural plaque-like airspace opacity associated with volume loss. Evolving tiny pleural effusion versus pleural thickening evident. Musculoskeletal:  No worrisome lytic or sclerotic osseous abnormality. Degenerative changes left shoulder with intra-articular loose bodies evident. Sclerotic lesion in the right T5 transverse process is stable. CT ABDOMEN PELVIS FINDINGS Hepatobiliary: No focal abnormality within the liver parenchyma. Possible tiny dependent gallstones (62/2). No intrahepatic or extrahepatic biliary dilation. Pancreas: No focal mass lesion. No dilatation of the main duct.  No intraparenchymal cyst. No peripancreatic edema. Spleen: No splenomegaly. No focal mass lesion. Adrenals/Urinary Tract: No adrenal nodule or mass. Kidneys unremarkable No evidence for hydroureter. The urinary bladder appears normal for the degree of distention. Stomach/Bowel: Tiny hiatal hernia. Stomach is nondistended. No gastric wall thickening. No evidence of outlet obstruction. Duodenum is normally positioned as is the ligament of Treitz. No small bowel wall thickening. No small bowel dilatation. The terminal ileum is normal. The appendix is not visualized, but there is no edema or inflammation in the region of the cecum. No gross colonic mass. No colonic wall thickening. No substantial diverticular change. Anastomotic staple line noted in the rectosigmoid region. Vascular/Lymphatic: There is abdominal aortic atherosclerosis without aneurysm. There is no gastrohepatic or hepatoduodenal ligament lymphadenopathy. No intraperitoneal or retroperitoneal lymphadenopathy. No pelvic sidewall lymphadenopathy. Reproductive: Uterus surgically absent. Other: The multiple soft tissue masses in the central pelvis are again identified. Right paracolic gutter lesion measures 1.4 x 1.7 cm today compared to 2.1 x 2.9 cm previously. 4.5 x 5.4 cm mass just posterior to the bladder (113/2) has increased from 4.5 x 4.9 cm when I remeasure in a similar fashion on the prior study. The lesion in the right adnexal space appears more cystic and has decreased in the interval measuring 3.4 x 3.7 cm (108/2) compared to 3.9 x 5.5 cm previously. Cystic lesion in the presacral space has clearly progressed and a heterogeneously enhancing lesion posterior to the rectum and just anterior to the coccyx measures 5.2 x 3.7 cm today compared to 2.9 x 2.1 cm when I remeasure on the prior study. Enhancing nodules in the right presacral space (110/2) are new in the interval presacral lesion just distal to the aortic bifurcation measures 2.4 x 5.1 cm  today. Musculoskeletal: Status post right hip replacement. No worrisome lytic or sclerotic osseous abnormality. IMPRESSION: 1. Granulosa cell tumor, by report on observation without current therapy. Somewhat confusing findings on today's exam given this history. The right paracolic gutter mass and a right adnexal mass have clearly decreased in size in the interval. Other masses appear relatively similar, but presacral disease and retroperitoneal disease at the aortic bifurcation have clearly progressed. Disease today represents a combination of mainly cystic, mainly solid, and combined appearance. 2. Interval development of subpleural plaque-like airspace opacity with volume loss in the left lower lobe, likely evolving scar/rounded atelectasis. Tiny left pleural effusion/thickening associated. Attention on follow-up recommended. 3. Bilateral pulmonary nodules, largely stable since prior exam. 4 mm ground-glass nodule in the left lung apex is new in the interval. Attention on follow-up exam suggested. 4.  Aortic Atherosclerois (ICD10-170.0) 5.  Emphysema. ([id number]) Electronically Signed   By: Misty Stanley M.D.   On: 04/08/2018 13:53   Ct Abdomen Pelvis W Contrast  Result Date: 05/05/2018 CLINICAL DATA:  Masses in the pelvis.  Pain management. EXAM: CT ABDOMEN AND PELVIS WITH CONTRAST TECHNIQUE: Multidetector CT imaging of the abdomen and pelvis was performed using the standard protocol following bolus administration of intravenous contrast. CONTRAST:  130mL ISOVUE-300 IOPAMIDOL (ISOVUE-300) INJECTION 61% COMPARISON:  May 01, 2018 FINDINGS: Lower chest: Left effusion and adjacent platelike scar atelectasis is stable.  AICD leads terminate in the right ventricle. Cardiomegaly again identified. Small hiatal hernia. No other changes in the lower chest. Hepatobiliary: Suspicion of a tiny nodule in the hepatic dome on series 2, image 14 not seen previously, measuring 5.7 mm. No other liver nodules or masses.  Cholelithiasis. The portal vein is patent. Pancreas: Unremarkable. No pancreatic ductal dilatation or surrounding inflammatory changes. Spleen: Normal in size without focal abnormality. Adrenals/Urinary Tract: Adrenal glands are normal. Mild left hydronephrosis and mild left ureterectasis are more prominent in the interval. No hydronephrosis on the right. The bladder is unchanged. Stomach/Bowel: The stomach and small bowel are normal. The sigmoid colon cannot be separated from pelvic masses, similar in the interval. Postoperative changes seen in the sigmoid colon. No colonic obstruction. The remainder of the colon is otherwise unremarkable. The appendix is not well seen consistent with history of previous appendectomy. Vascular/Lymphatic: Atherosclerotic changes are seen in the nonaneurysmal aorta. Reproductive: The patient is status post hysterectomy. Other: The right pericolic nodule measures 15 by 11 mm today versus 14 x 10 mm previously. Multiple pelvic masses are again identified. The mass inferior to the bifurcation measures 4.4 by 2.6 cm today versus 4.7 x 2.6 cm previously. The right adnexal low-density lesion measures 3.8 by 1.9 cm today versus 3.6 x 2.4 cm previously. The mass posterior to the bladder measures 5.5 x 4.5 cm today versus 5.4 x 4.4 cm previously. The presacral mass measures 6.8 by 6.3 cm today versus 6.7 x 6.4 cm previously. Adjacent pelvic nodes are similar in the interval. Musculoskeletal: Previous right hip replacement. Degenerative changes in the left hip. No bony metastatic disease. IMPRESSION: 1. The right para colic nodule/mass and the multiple pelvic masses are similar in the interval. Slight differences in measurement could be due to difference in slice selection. Recommend continued attention on follow-up. 2. Mild left hydronephrosis, new in the interval, likely due to compression of the distal left ureter by 1 or more of the pelvic masses. No obstruction on the right. 3. Suspicion  of a new nodule in the hepatic dome as above. Recommend attention on follow-up. Alternatively, an MRI could further assess if clinically warranted. 4. Atherosclerotic changes in the nonaneurysmal aorta. 5. Stable left effusion and platelike scarring/atelectasis. Electronically Signed   By: Dorise Bullion III M.D   On: 05/05/2018 00:08   Ct Abdomen Pelvis W Contrast  Result Date: 05/01/2018 CLINICAL DATA:  Pain in the region of tailbone biopsy EXAM: CT ABDOMEN AND PELVIS WITH CONTRAST TECHNIQUE: Multidetector CT imaging of the abdomen and pelvis was performed using the standard protocol following bolus administration of intravenous contrast. CONTRAST:  112mL OMNIPAQUE IOHEXOL 300 MG/ML  SOLN COMPARISON:  05/01/2018, 04/08/2018, 08/26/2017, 06/03/2017 CT FINDINGS: Lower chest: Lung bases again demonstrate small left pleural effusion with platelike consolidation in the posterior peripheral left lung base, similar as compared with 04/08/2018. Partially visualized cardiac pacing leads. Borderline cardiomegaly. Hepatobiliary: No focal liver abnormality is seen. No gallstones, gallbladder wall thickening, or biliary dilatation. Pancreas: Unremarkable. No pancreatic ductal dilatation or surrounding inflammatory changes. Spleen: Normal in size without focal abnormality. Adrenals/Urinary Tract: Adrenal glands are unremarkable. Kidneys are normal, without renal calculi, focal lesion, or hydronephrosis. Mass-effect on the posterior bladder by pelvic mass. Stomach/Bowel: Stomach is nonenlarged. No dilated small bowel. No colon wall thickening. Postsurgical changes of the rectosigmoid colon. Sigmoid colon difficult to separate from central pelvic mass. Vascular/Lymphatic: Moderate aortic atherosclerosis. No aneurysmal dilatation. Reproductive: History of hysterectomy and oophorectomy on epic. Other: Right paracolic gutter lesion  measures 14 x 10 mm compared with 14 x 17 mm previously. Multiple pelvic masses are again  identified. Mass inferior to the iliac bifurcation measures 47 x 26 mm compared with 51 x 24 mm previously. Right adnexal low-density lesion measures 36 x 24 mm compared with 34 x 37 mm previously. Central low pelvic mass posterior to the bladder measures 44 x 54 mm, previously 54 x 45 mm. Increased size of presacral lesion, this measures 64 x 67 mm, compared with 51 x 37 mm. Surrounding hazy attenuation, may be secondary to post biopsy change or inflammation of the mass. There is mass effect on the adjacent rectum. Size of this mass does not appear significantly changed when compared to the biopsy images performed today. Multiple additional presacral masses are noted. There are multiple enhancing nodules in the right pelvic fat. There is edema in the perirectal space. Musculoskeletal: No acute or suspicious osseous abnormality. Prior right hip replacement. IMPRESSION: 1. Multiple known pelvic masses. Significant increase in size of the biopsied presacral lesion as compared with 04/08/2018. There is soft tissue stranding surrounding the mass which may be secondary to post biopsy change or inflammation of the mass. This mass exerts mass effect on the rectum. There is perirectal edema. There are multiple additional pelvic masses which are grossly stable in size since the comparison CT. The mass situated posterior to the bladder is difficult to separate from adjacent sigmoid colon and question invasion. 2. Similar appearance of small left pleural effusion with platelike consolidation in the peripheral left lung base. Electronically Signed   By: Donavan Foil M.D.   On: 05/01/2018 18:58   Ct Abdomen Pelvis W Contrast  Result Date: 04/08/2018 CLINICAL DATA:  Granulosa cell tumor of the ovary. Elevated tumor markers. EXAM: CT CHEST, ABDOMEN, AND PELVIS WITH CONTRAST TECHNIQUE: Multidetector CT imaging of the chest, abdomen and pelvis was performed following the standard protocol during bolus administration of  intravenous contrast. CONTRAST:  57mL ISOVUE-300 IOPAMIDOL (ISOVUE-300) INJECTION 61% COMPARISON:  CTA chest/abdomen/pelvis 08/26/2017. FINDINGS: CT CHEST FINDINGS Cardiovascular: The heart size is normal. No substantial pericardial effusion. Atherosclerotic calcification is noted in the wall of the thoracic aorta. Status post mitral valve replacement. Left-sided permanent pacemaker/AICD evident. Mediastinum/Nodes: No mediastinal lymphadenopathy. 15 mm short axis paratracheal lymph node measured on the prior study has decreased in the interval now measuring 7 mm. There is no hilar lymphadenopathy. The esophagus has normal imaging features. There is no axillary lymphadenopathy. Lungs/Pleura: The central tracheobronchial airways are patent. Centrilobular emphysema noted. 4 mm right perifissural nodule (71/3) is stable. 10 mm ground-glass nodule identified posterior right upper lobe on prior study is less confluent today and measures 7 mm. 4 mm ground-glass nodule identified in the left apex with additional peripheral ground-glass nodules. 6 mm left upper lobe nodule (42/3) is similar to prior. Interval development of subpleural plaque-like airspace opacity associated with volume loss. Evolving tiny pleural effusion versus pleural thickening evident. Musculoskeletal: No worrisome lytic or sclerotic osseous abnormality. Degenerative changes left shoulder with intra-articular loose bodies evident. Sclerotic lesion in the right T5 transverse process is stable. CT ABDOMEN PELVIS FINDINGS Hepatobiliary: No focal abnormality within the liver parenchyma. Possible tiny dependent gallstones (62/2). No intrahepatic or extrahepatic biliary dilation. Pancreas: No focal mass lesion. No dilatation of the main duct. No intraparenchymal cyst. No peripancreatic edema. Spleen: No splenomegaly. No focal mass lesion. Adrenals/Urinary Tract: No adrenal nodule or mass. Kidneys unremarkable No evidence for hydroureter. The urinary bladder  appears normal for the degree of  distention. Stomach/Bowel: Tiny hiatal hernia. Stomach is nondistended. No gastric wall thickening. No evidence of outlet obstruction. Duodenum is normally positioned as is the ligament of Treitz. No small bowel wall thickening. No small bowel dilatation. The terminal ileum is normal. The appendix is not visualized, but there is no edema or inflammation in the region of the cecum. No gross colonic mass. No colonic wall thickening. No substantial diverticular change. Anastomotic staple line noted in the rectosigmoid region. Vascular/Lymphatic: There is abdominal aortic atherosclerosis without aneurysm. There is no gastrohepatic or hepatoduodenal ligament lymphadenopathy. No intraperitoneal or retroperitoneal lymphadenopathy. No pelvic sidewall lymphadenopathy. Reproductive: Uterus surgically absent. Other: The multiple soft tissue masses in the central pelvis are again identified. Right paracolic gutter lesion measures 1.4 x 1.7 cm today compared to 2.1 x 2.9 cm previously. 4.5 x 5.4 cm mass just posterior to the bladder (113/2) has increased from 4.5 x 4.9 cm when I remeasure in a similar fashion on the prior study. The lesion in the right adnexal space appears more cystic and has decreased in the interval measuring 3.4 x 3.7 cm (108/2) compared to 3.9 x 5.5 cm previously. Cystic lesion in the presacral space has clearly progressed and a heterogeneously enhancing lesion posterior to the rectum and just anterior to the coccyx measures 5.2 x 3.7 cm today compared to 2.9 x 2.1 cm when I remeasure on the prior study. Enhancing nodules in the right presacral space (110/2) are new in the interval presacral lesion just distal to the aortic bifurcation measures 2.4 x 5.1 cm today. Musculoskeletal: Status post right hip replacement. No worrisome lytic or sclerotic osseous abnormality. IMPRESSION: 1. Granulosa cell tumor, by report on observation without current therapy. Somewhat confusing  findings on today's exam given this history. The right paracolic gutter mass and a right adnexal mass have clearly decreased in size in the interval. Other masses appear relatively similar, but presacral disease and retroperitoneal disease at the aortic bifurcation have clearly progressed. Disease today represents a combination of mainly cystic, mainly solid, and combined appearance. 2. Interval development of subpleural plaque-like airspace opacity with volume loss in the left lower lobe, likely evolving scar/rounded atelectasis. Tiny left pleural effusion/thickening associated. Attention on follow-up recommended. 3. Bilateral pulmonary nodules, largely stable since prior exam. 4 mm ground-glass nodule in the left lung apex is new in the interval. Attention on follow-up exam suggested. 4.  Aortic Atherosclerois (ICD10-170.0) 5.  Emphysema. ([id number]) Electronically Signed   By: Misty Stanley M.D.   On: 04/08/2018 13:53   Ct Biopsy  Result Date: 05/01/2018 INDICATION: 57 year old female with a history of granulosa cell tumor of the ovary, unspecified laterality. She has demonstrated evidence of progressive disease on her most recent CT imaging and presents for CT-guided biopsy of the same. Of note, she has had progressive severe pelvic pain over the past 5 days which is unrelieved by Percocet. She intends on going to the emergency room following her procedure and recovery today for further evaluation and management. I have contacted her oncologist, Dr. Marin Olp, and he is aware and agrees with her visiting the emergency room. The patient still desires to proceed with the biopsy in order to get tissue for molecular studies. EXAM: CT BIOPSY MEDICATIONS: None. ANESTHESIA/SEDATION: Moderate (conscious) sedation was employed during this procedure. A total of Versed 1 mg and Fentanyl 50 mcg was administered intravenously. Moderate Sedation Time: 15 minutes. The patient's level of consciousness and vital signs were  monitored continuously by radiology nursing throughout the procedure under my  direct supervision. FLUOROSCOPY TIME:  Fluoroscopy Time: 0 minutes 0 seconds (0 mGy). COMPLICATIONS: None immediate. PROCEDURE: Informed written consent was obtained from the patient after a thorough discussion of the procedural risks, benefits and alternatives. All questions were addressed. A timeout was performed prior to the initiation of the procedure. A planning axial CT scan was performed. The heterogeneous mass in the presacral space was successfully identified. A suitable skin entry site was selected and marked. The region was sterilely prepped and draped in standard fashion using chlorhexidine skin prep. Local anesthesia was attained by infiltration with 1% lidocaine. A small dermatotomy was made. Under intermittent CT guidance, an 18 gauge introducer needle was advanced into the margin of the mass. Multiple 18 gauge core biopsies were then coaxially obtained using the bio Pince automated biopsy device. Biopsy specimens were placed in formalin and delivered to pathology for further analysis. The introducer needle was removed. Post biopsy axial CT imaging demonstrates no evidence of immediate complication. The patient tolerated the procedure well. IMPRESSION: Technically successful CT-guided core biopsy of presacral mass. Electronically Signed   By: Jacqulynn Cadet M.D.   On: 05/01/2018 13:44    Time Spent in minutes  25   [place] Huberty M.D on 05/07/2018 at 10:57 AM  Between 7am to 7pm - Pager - [phone]  After 7pm go to [email or web address] - password [facility]  [facility] -  Office  [phone]

## 2018-05-07 NOTE — Progress Notes (Signed)
  Radiation Oncology         [phone]) [phone] ________________________________  Name: [name] MRN: [id number]  Date: 05/07/2018  DOB: [birth year]  Simulation Verification Note    ICD-10-CM   1. Granulosa cell tumor of right ovary D39.11     Status: inpatient  NARRATIVE: The patient was brought to the treatment unit and placed in the planned treatment position. The clinical setup was verified. Then port films were obtained and uploaded to the radiation oncology medical record software.  The treatment beams were carefully compared against the planned radiation fields. The position location and shape of the radiation fields was reviewed. They targeted volume of tissue appears to be appropriately covered by the radiation beams. Organs at risk appear to be excluded as planned.  Based on my personal review, I approved the simulation verification. The patient's treatment will proceed as planned.  -----------------------------------  Blair Promise, PhD, MD

## 2018-05-08 ENCOUNTER — Encounter (HOSPITAL_COMMUNITY): Payer: Self-pay

## 2018-05-08 ENCOUNTER — Ambulatory Visit
Admit: 2018-05-08 | Discharge: 2018-05-08 | Disposition: A | Discharge status: Home / Self Care | Payer: BLUE CROSS/BLUE SHIELD | Attending: Radiation Oncology | Admitting: Radiation Oncology

## 2018-05-08 LAB — PROTIME-INR
INR: 2.39
PROTHROMBIN TIME: 25.7 s — AB (ref 11.4–15.2)

## 2018-05-08 MED ORDER — WARFARIN - PHYSICIAN DOSING INPATIENT: Freq: Every day | Status: DC

## 2018-05-08 MED ORDER — PHYTONADIONE 5 MG PO TABS
5.0000 mg | ORAL_TABLET | Freq: Once | ORAL | Status: AC
  Administered 2018-05-08: 5 mg via ORAL
  Filled 2018-05-08: qty 1

## 2018-05-08 MED ORDER — WARFARIN SODIUM 5 MG PO TABS: 7.5000 mg | ORAL_TABLET | ORAL | Status: DC

## 2018-05-08 MED ORDER — PHENYLEPH-SHARK LIV OIL-MO-PET 0.25-3-14-71.9 % RE OINT
TOPICAL_OINTMENT | Freq: Two times a day (BID) | RECTAL | Status: DC | PRN
  Administered 2018-05-08 – 2018-05-14 (×2): via RECTAL
  Filled 2018-05-08: qty 28.4

## 2018-05-08 MED ORDER — ENOXAPARIN SODIUM 100 MG/ML ~~LOC~~ SOLN
1.0000 mg/kg | Freq: Two times a day (BID) | SUBCUTANEOUS | Status: AC
  Administered 2018-05-08 – 2018-05-09 (×3): 90 mg via SUBCUTANEOUS
  Filled 2018-05-08 (×3): qty 1

## 2018-05-08 MED ORDER — ENOXAPARIN SODIUM 100 MG/ML ~~LOC~~ SOLN: 1.0000 mg/kg | Freq: Two times a day (BID) | SUBCUTANEOUS | Status: DC

## 2018-05-08 MED ORDER — OXYCODONE HCL ER 20 MG PO T12A
20.0000 mg | EXTENDED_RELEASE_TABLET | Freq: Two times a day (BID) | ORAL | Status: DC
  Administered 2018-05-08 – 2018-05-15 (×15): 20 mg via ORAL
  Filled 2018-05-08 (×15): qty 1

## 2018-05-08 MED ORDER — WARFARIN - PHARMACIST DOSING INPATIENT: Freq: Every day | Status: DC

## 2018-05-08 MED ORDER — CEFAZOLIN SODIUM-DEXTROSE 2-4 GM/100ML-% IV SOLN
2.0000 g | INTRAVENOUS | Status: AC
  Administered 2018-05-11: 2 g via INTRAVENOUS
  Filled 2018-05-08: qty 100

## 2018-05-08 MED ORDER — WARFARIN SODIUM 5 MG PO TABS: 5.0000 mg | ORAL_TABLET | ORAL | Status: DC

## 2018-05-08 NOTE — Progress Notes (Signed)
Referring Physician(s): Ennever,P  Supervising Physician: Sandi Mariscal  Patient Status:  [name] - In-pt  Chief Complaint:  Ovarian cancer, sacral pain  Subjective: Patient familiar to IR service from prior Port-A-Cath in March 2007 with removal in September 2007, lower abdominal/pelvic mass biopsy in 2014, Port-A-Cath placement in 2014 with removal in 2015, abdominal wall lymph node biopsy on 2015 and presacral mass biopsy on 05/01/2018.  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  Request now received from oncology for Port-A-Cath placement.  She currently denies fever, headache, chest pain, dyspnea, cough, dominant pain or abnormal bleeding.  She does have some occasional nausea and vomiting and sacral pain.  Past Medical History:  Diagnosis Date  . Arthritis    "everywhere"  . Asthma    as a child  . Automatic implantable cardioverter-defibrillator in situ    DR. Beckie Salts   . Bladder cancer [facility]) 2009   "injected medicine to get rid of it"  . Chronic systolic CHF (congestive heart failure) ([place])   . CKD (chronic kidney disease), stage III ([place])   . Coronary artery disease    a. large RV/inferior MI complicated by pseudoaneurysm s/p aneurysemectomy and CABG 10/2009 with papillary muscle rupture [facility]rost[facility].  . Granulosa cell carcinoma of ovary ([place])    recurrent - surgical resection 2007, 2014 and 2016   . H/O thymectomy   . High cholesterol   . Hypothyroidism   . ICD (implantable cardiac defibrillator) in place   . Ischemic cardiomyopathy    a.  ICM and VT arrest 10/2009 s/p AICD (revision 01/2012 due to RV lead problem).  . Myocardial infarction ([place]) 10/14/2009  . Neuropathy    FEET AND HANDS - FROM CHEMO - BUT MUCH  IMPROVED  . PAF (paroxysmal atrial fibrillation) ([place])   . Pain    JOINT PAINS AND MUSCLE ACHES ALL OVER.  Marland Kitchen Port-A-Cath in place    RIGHT UPPER CHEST  . Prosthetic valve dysfunction   . S/P mitral valve replacement with bioprosthetic valve 10/21/2009   Edwards Perimount stented bovine pericardial tissue valve, size 29 mm  . S/P valve-in-valve transcatheter mitral valve replacement 09/16/2017   29 mm Edwards Sapien 3 transcatheter heart valve placed via transapical approach  . SVT (supraventricular tachycardia) ([place])   . Tobacco abuse   . Ventricular tachycardia [facility])    Past Surgical History:  Procedure Laterality Date  . ABDOMINAL HYSTERECTOMY N/A 07/27/2013   Procedure: TUMOR EXCISION OF ABDOMINAL MASS;  Surgeon: Alvino Chapel, MD;  Location: [facility] ORS;  Service: Gynecology;  Laterality: N/A;  . APPENDECTOMY  1989  . BILATERAL OOPHORECTOMY  2007  . CARDIAC DEFIBRILLATOR PLACEMENT  02/06/2012   "lead change"  . CARDIAC VALVE REPLACEMENT    . CYSTOSCOPY Right 02/23/2013   Procedure: CYSTOSCOPY WITH STENT REMOVAL;  Surgeon: Alvino Chapel, MD;  Location: [facility] ORS;  Service: Gynecology;  Laterality: Right;  . IMPLANTABLE CARDIOVERTER DEFIBRILLATOR REVISION N/A 02/06/2012   Procedure: IMPLANTABLE CARDIOVERTER DEFIBRILLATOR REVISION;  Surgeon: Evans Lance, MD;  Location: [facility] CATH LAB;  Service: Cardiovascular;  Laterality: N/A;  . INSERT / REPLACE / REMOVE PACEMAKER  10/2009   DUAL-CHAMBER St. JUDE DEFIBRILLATOR  . LAPAROTOMY N/A 01/19/2013   Procedure: TUMOR DEBULKING / BOWEL RESECTION/INSERTION RIGHT UTERERAL DOUBLE J  STENT/OMENTECTOMY;  Surgeon: Alvino Chapel, MD;  Location: [facility] ORS;  Service: Gynecology;  Laterality: N/A;  . MITRAL VALVE REPLACEMENT  10/21/2009   45mm Edwards Perimount bovine pericardial tissue valve - [facility]  . RIGHT/LEFT HEART CATH AND CORONARY ANGIOGRAPHY N/A 07/02/2017   Procedure: RIGHT/LEFT HEART CATH AND CORONARY ANGIOGRAPHY;  Surgeon:  Belva Crome, MD;  Location: [place] CV LAB;  Service: Cardiovascular;  Laterality: N/A;  . TEE WITHOUT CARDIOVERSION N/A 09/16/2017   Procedure: TRANSESOPHAGEAL ECHOCARDIOGRAM (TEE);  Surgeon: [name], MD;  Location: [place];  Service: Open Heart Surgery;  Laterality: N/A;  . THYMECTOMY  2009  . TONSILLECTOMY AND ADENOIDECTOMY  ~ 1967  . TOTAL HIP ARTHROPLASTY  05/06/2012   Procedure: TOTAL HIP ARTHROPLASTY;  Surgeon: Gearlean Alf, MD;  Location: [facility]S;  Service: Orthopedics;  Laterality: Right;  . TRANSCATHETER MITRAL VALVE REPLACEMENT, TRANSAPICAL N/A 09/16/2017   Procedure: TRANSCATHETER MITRAL VALVE REPLACEMENT,TRANSAPICAL;  Surgeon: [name], MD;  Location: [place];  Service: Open Heart Surgery;  Laterality: N/A;  . TRANSURETHRAL RESECTION OF BLADDER  2009   "for bladder cancer"  . TUBAL LIGATION  1993  . VAGINAL HYSTERECTOMY  2001      Allergies: Prochlorperazine edisylate; Adhesive [tape]; and Prochlorperazine  Medications: Prior to Admission medications   Medication Sig Start Date End Date Taking? Authorizing Provider  acetaminophen (TYLENOL) 500 MG tablet Take 2 tablets (1,000 mg total) by mouth every 6 (six) hours as needed for moderate pain or headache. 05/02/18  Yes Vann, Jessica U, DO  albuterol (PROVENTIL HFA;VENTOLIN HFA) 108 (90 Base) MCG/ACT inhaler Inhale 2 puffs into the lungs every 4 (four) hours as needed for wheezing or shortness of breath. 06/06/17  Yes Eugenie Filler, MD  aspirin EC 81 MG EC tablet Take 1 tablet (81 mg total) by mouth daily. 09/19/17  Yes Gold, Patrick Jupiter E, PA-C  b complex vitamins tablet Take 1 tablet by mouth 2 (two) times daily.    Yes [provider]  carvedilol (COREG) 3.125 MG tablet Take 1 tablet (3.125 mg total) by mouth 2 (two) times daily with a meal. 10/06/17  Yes Lars Pinks M, PA-C  cholecalciferol (VITAMIN D-400) 400 UNITS TABS tablet Take 400 Units by mouth 2 (two) times daily.   Yes [provider]  diphenhydrAMINE (BENADRYL) 25 MG tablet Take 25 mg by mouth every 6 (six) hours as needed for itching or allergies.    Yes [provider]  enoxaparin (LOVENOX) 80 MG/0.8ML injection Inject 0.8 mLs (80 mg total) into the skin every 12 (twelve) hours. 04/24/18  Yes Belva Crome, MD  furosemide (LASIX) 20 MG tablet TAKE 1 TAB BY MOUTH DAILY. MAY TAKE AN EXTRA TAB AS NEEDED FOR LEG SWELLING OR SHORTNESS OF BREATH Patient taking differently: Take 20 mg by mouth daily.  10/29/17  Yes Evans Lance, MD  HYDROmorphone (DILAUDID) 2 MG tablet Take 2 mg by mouth every 4 (four) hours.   Yes [provider]  LORazepam (ATIVAN) 0.5 MG tablet Take 1 tablet (0.5 mg total) by mouth every 6 (six) hours as needed (for nausea and vomiting). 05/02/18  Yes Geradine Girt, DO  spironolactone (ALDACTONE) 25 MG tablet Take 1 tablet (25 mg total) by mouth daily. 06/11/17 06/06/18 Yes Belva Crome, MD  Vitamin D, Ergocalciferol, (DRISDOL) 50000 units CAPS capsule TAKE 1 CAPSULE (50,000 UNITS TOTAL) BY MOUTH EVERY 7 (SEVEN) DAYS. Patient taking differently: Take 50,000 Units by mouth every Thursday.  10/23/17  Yes Volanda Napoleon, MD  warfarin (COUMADIN) 5 MG tablet Take 1-1.5 tablets (5-7.5 mg total) by mouth See admin instructions. Take 7.5 mg by mouth daily on Monday, Wednesday and Friday. Take 5 mg by mouth daily on all other days. 05/02/18  Yes Vann, Jessica U, DO  zolpidem (AMBIEN) 5 MG tablet TAKE 1 TABLET BY MOUTH EVERYDAY AT BEDTIME Patient taking differently: Take 5 mg by mouth at bedtime.  02/16/18  Yes Volanda Napoleon, MD  HYDROmorphone (DILAUDID) 4 MG tablet Take 1 tablet (4 mg total) by mouth every 4 (four) hours as needed for severe pain. 05/05/18   Jola Schmidt, MD     Vital Signs: BP (!) 100/55   Pulse 80   Temp 98 F (36.7 C) (Oral)   Resp 18   Ht 5\' 6"  (1.676 m)   Wt 197 lb (89.4 kg)   SpO2 100%   BMI 31.80 kg/m   Physical Exam awake, alert.  Chest with  slightly diminished breath sounds left base, right clear.  Left chest wall AICD.  Heart with regular rate and rhythm.  Abdomen soft, positive bowel sounds, nontender.  No significant lower extremity edema.  Imaging: Ct Abdomen Pelvis W Contrast  Result Date: 05/05/2018 CLINICAL DATA:  Masses in the pelvis.  Pain management. EXAM: CT ABDOMEN AND PELVIS WITH CONTRAST TECHNIQUE: Multidetector CT imaging of the abdomen and pelvis was performed using the standard protocol following bolus administration of intravenous contrast. CONTRAST:  166mL ISOVUE-300 IOPAMIDOL (ISOVUE-300) INJECTION 61% COMPARISON:  May 01, 2018 FINDINGS: Lower chest: Left effusion and adjacent platelike scar atelectasis is stable. AICD leads terminate in the right ventricle. Cardiomegaly again identified. Small hiatal hernia. No other changes in the lower chest. Hepatobiliary: Suspicion of a tiny nodule in the hepatic dome on series 2, image 14 not seen previously, measuring 5.7 mm. No other liver nodules or masses. Cholelithiasis. The portal vein is patent. Pancreas: Unremarkable. No pancreatic ductal dilatation or surrounding inflammatory changes. Spleen: Normal in size without focal abnormality. Adrenals/Urinary Tract: Adrenal glands are normal. Mild left hydronephrosis and mild left ureterectasis are more prominent in the interval. No hydronephrosis on the right. The bladder is unchanged. Stomach/Bowel: The stomach and small bowel are normal. The sigmoid colon cannot be separated from pelvic masses, similar in the interval. Postoperative changes seen in the sigmoid colon. No colonic obstruction. The remainder of the colon is otherwise unremarkable. The appendix is not well seen consistent with history of previous appendectomy. Vascular/Lymphatic: Atherosclerotic changes are seen in the nonaneurysmal aorta. Reproductive: The patient is status post hysterectomy. Other: The right pericolic nodule measures 15 by 11 mm today versus 14 x 10  mm previously. Multiple pelvic masses are again identified. The mass inferior to the bifurcation measures 4.4 by 2.6 cm today versus 4.7 x 2.6 cm previously. The right adnexal low-density lesion measures 3.8 by 1.9 cm today versus 3.6 x 2.4 cm previously. The mass posterior to the bladder measures 5.5 x 4.5 cm today versus 5.4 x 4.4 cm previously. The presacral mass measures 6.8 by 6.3 cm today versus 6.7 x 6.4 cm previously. Adjacent pelvic nodes are similar in the interval. Musculoskeletal: Previous right hip replacement. Degenerative changes in the left hip. No bony metastatic disease. IMPRESSION: 1. The right para colic nodule/mass and the multiple pelvic masses are similar in the interval. Slight differences in measurement could be due to difference in slice selection. Recommend continued attention on follow-up. 2. Mild left hydronephrosis, new in the interval,  likely due to compression of the distal left ureter by 1 or more of the pelvic masses. No obstruction on the right. 3. Suspicion of a new nodule in the hepatic dome as above. Recommend attention on follow-up. Alternatively, an MRI could further assess if clinically warranted. 4. Atherosclerotic changes in the nonaneurysmal aorta. 5. Stable left effusion and platelike scarring/atelectasis. Electronically Signed   By: Dorise Bullion III M.D   On: 05/05/2018 00:08    Labs:  CBC: Recent Labs    05/01/18 0925 05/01/18 1501 05/02/18 0623 05/04/18 2221 05/06/18 0539  WBC 18.8*  --  16.7* 14.8* 10.1  HGB 12.4 11.6* 11.1* 11.7* 11.5*  HCT 38.7 34.0* 34.2* 35.3* 36.2  PLT 145*  --  111* 195 194    COAGS: Recent Labs    09/12/17 0821  05/05/18 1250 05/06/18 0539 05/07/18 0543 05/08/18 0611  INR 1.02   < > 1.61 2.05 2.29 2.39  APTT 31  --   --   --   --   --    < > = values in this interval not displayed.    BMP: Recent Labs    05/02/18 0623 05/04/18 2245 05/05/18 1250 05/06/18 0539  NA 135 136 138 137  K 4.3 2.9* 3.1* 3.6    CL 106 99 101 101  CO2 22 26 26 27   GLUCOSE 117* 109* 93 104*  BUN 14 13 10 10   CALCIUM 8.8* 8.8* 8.8* 8.8*  CREATININE 1.10* 1.03* 0.96 0.92  GFRNONAA 54* 59* >60 >60  GFRAA >60 >60 >60 >60    LIVER FUNCTION TESTS: Recent Labs    06/03/17 1241 06/04/17 0047 09/12/17 0821 03/06/18 1140  BILITOT 0.7 0.9 0.6 0.5  AST 23 24 20 19   ALT 19 19 13* 26  ALKPHOS 61 61 58 71  PROT 6.8 6.4* 6.8 7.1  ALBUMIN 3.4* 3.2* 4.1 3.7    Assessment and Plan: Pt with history of coronary artery disease with prior MI and ICD placement, CHF, mitral valve repair on Coumadin as outpatient currently transitioned to Lovenox, chronic kidney disease, COPD and 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  Request now received from oncology for Port-A-Cath placement.  Risks and benefits of image guided port-a-catheter placement was discussed with the patient including, but not limited to bleeding, infection, pneumothorax, or fibrin sheath development and need for additional procedures.  All of the patient's questions were answered, patient is agreeable to proceed. Consent signed and in chart.  PT/INR today 25.7/2.39; will recheck labs 11/18 and if normalized plan port placement; lovenox will need to be held day before procedure.   Electronically Signed: D. Rowe Robert, PA-C 05/08/2018, 2:02 PM   I spent a total of 25 minutes at the the patient's bedside AND on the patient's hospital floor or unit, greater than 50% of which was counseling/coordinating care for port a cath placement    Patient ID: [name], female   DOB: 02-11-1961, 57 y.o.   MRN: [id number]

## 2018-05-08 NOTE — Progress Notes (Signed)
PROGRESS NOTE                                                                                                                                                                                                             Patient Demographics:    [name], is a 57 y.o. female, DOB - 07/16/1960, HYI:[id number]  Admit date - 05/04/2018   Admitting Physician Rise Patience, MD  Outpatient Primary MD for the patient is Default, Provider, MD  LOS - 2  Outpatient Specialists: Dr Mosetta Anis  Chief Complaint  Patient presents with  . 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  She had a radical debulking along with rectosigmoid resection with low rectal anastomosis, right distal ureter resection, ureteroneocystostom and omentectomy in 2014 followed by excellent chemotherapy.  She had exploratory laparotomy with radical resection of recurrent tumor in 2017.  Patient now following with Dr. Marin Olp and presenting with severe persistent abdominal pain.  Patient had a recent retroperitoneal soft tissue biopsy done on 11/8 showing granulosa cell tumor and CT of the abdomen showing multiple pelvic masses and mild left hydronephrosis with suspicious new nodule in the hepatic dome. Patient admitted for adequate pain control.    Subjective:   Needing frequent p.o. and IV Dilaudid.  Having loose bowel movement.  Assessment  & Plan :    Principal Problem:   Intractable abdominal pain Secondary to recurrence of her granulosa cell tumor with multiple pelvic masses, mild left hydronephrosis and possible new hepatic lesion. OxyContin dose increased to 20 mg twice daily by oncologist today.  Still requiring IV Dilaudid 2 mg every 2 hours despite adding oral Dilaudid 2 mg every 4 hours as needed.     GYN oncology consulted, who after discussing with patient's surgeon (Dr. Aldean Ast) at [facility] recommended palliative radiation.  Simulation followed by plan on 5 weeks of radiation directed at the pelvis.    Active Problems: Recurrence of granulosa cell tumor. Plan as above.  Oncology, radiation onc and GYN-onc on board.  Plan on palliative radiation as above.Oncology recommends Port-A-Cath placement with plan on starting low-dose chemotherapy.  Held Coumadin and placed on subcu Lovenox  History of chronic CHF Last echo  in May 2019 with EF of 45%.  Also has history of V. tach status post ICD.  Currently euvolemic.  Continue home diuretic.  History of mitral valve repair Following MI with postinfarction papillary muscle rupture in 2011.  INR therapeutic.  Coumadin held for Port-A-Cath placement early next week and placed on Lovenox.  Mild left-sided hydronephrosis As seen on CT.  Monitor renal function.  Voiding without difficulty.  Chronic kidney disease stage III Stable at baseline  COPD Stable.  Continue home meds.  Hypokalemia Replenished  ?  Paroxysmal A. fib Patient denies history of it.  On Coumadin for mitral valve repair.        Code Status : Full code  Family Communication  : None at bedside  Disposition Plan  : Home early next week after Port-A-Cath placement and chemotherapy initiated.  Barriers For Discharge : Active symptoms  Consults  : Oncology, GYN-onc  Procedures  : CT abdomen and pelvis DVT prophylaxis: Therapeutic Lovenox  Lab Results  Component Value Date   PLT 194 05/06/2018    Antibiotics  :   Anti-infectives (From admission, onward)   None        Objective:   Vitals:   05/07/18 2006 05/08/18 0515 05/08/18 0736 05/08/18 1218  BP: (!) 93/56 (!) 98/54 (!) 106/54 (!) 100/55  Pulse: 82 81 83 80  Resp: 18 18    Temp: 98.4 F (36.9 C) 98.1 F (36.7 C) 98.1 F (36.7 C) 98 F (36.7 C)  TempSrc: Oral Oral  Oral  SpO2: 97% 93%  96% 100%  Weight:      Height:        Wt Readings from Last 3 Encounters:  05/04/18 89.4 kg  05/01/18 89.4 kg  05/01/18 89.4 kg    No intake or output data in the 24 hours ending 05/08/18 1326  Physical exam Not in distress HEENT: Moist mucosa, supple neck Chest: Clear to auscultation bilaterally CVS: Normal S1 and S2, no murmurs GI: Soft, bowel sounds present, nontender, nondistended Musculoskeletal: Warm, no edema     Data Review:    CBC Recent Labs  Lab 05/01/18 1501 05/02/18 0623 05/04/18 2221 05/06/18 0539  WBC  --  16.7* 14.8* 10.1  HGB 11.6* 11.1* 11.7* 11.5*  HCT 34.0* 34.2* 35.3* 36.2  PLT  --  111* 195 194  MCV  --  92.4 90.3 92.8  MCH  --  30.0 29.9 29.5  MCHC  --  32.5 33.1 31.8  RDW  --  15.1 14.9 15.0  LYMPHSABS  --  0.7  --   --   MONOABS  --  0.3  --   --   EOSABS  --  0.0  --   --   BASOSABS  --  0.0  --   --     Chemistries  Recent Labs  Lab 05/01/18 1501 05/02/18 0623 05/04/18 2245 05/05/18 1250 05/06/18 0539  NA 137 135 136 138 137  K 3.1* 4.3 2.9* 3.1* 3.6  CL 102 106 99 101 101  CO2  --  22 26 26 27   GLUCOSE 105* 117* 109* 93 104*  BUN 12 14 13 10 10   CREATININE 1.10* 1.10* 1.03* 0.96 0.92  CALCIUM  --  8.8* 8.8* 8.8* 8.8*  MG  --  1.7  --  1.9  --    ------------------------------------------------------------------------------------------------------------------ No results for input(s): CHOL, HDL, LDLCALC, TRIG, CHOLHDL, LDLDIRECT in the last 72 hours.  Lab Results  Component Value Date  HGBA1C 5.0 09/12/2017   ------------------------------------------------------------------------------------------------------------------ No results for input(s): TSH, T4TOTAL, T3FREE, THYROIDAB in the last 72 hours.  Invalid input(s): FREET3 ------------------------------------------------------------------------------------------------------------------ No results for input(s): VITAMINB12, FOLATE, FERRITIN, TIBC, IRON, RETICCTPCT  in the last 72 hours.  Coagulation profile Recent Labs  Lab 05/02/18 0623 05/05/18 1250 05/06/18 0539 05/07/18 0543 05/08/18 0611  INR 1.17 1.61 2.05 2.29 2.39    No results for input(s): DDIMER in the last 72 hours.  Cardiac Enzymes No results for input(s): CKMB, TROPONINI, MYOGLOBIN in the last 168 hours.  Invalid input(s): CK ------------------------------------------------------------------------------------------------------------------    Component Value Date/Time   BNP 327.3 (H) 09/12/2017 9485    Inpatient Medications  Scheduled Meds: . aspirin EC  81 mg Oral Daily  . carvedilol  3.125 mg Oral BID WC  . enoxaparin (LOVENOX) injection  1 mg/kg Subcutaneous Q12H  . furosemide  20 mg Oral Daily  . gabapentin  300 mg Oral TID  . leuprolide  7.5 mg Intramuscular Once  . oxyCODONE  20 mg Oral Q12H  . polyethylene glycol  17 g Oral BID  . spironolactone  25 mg Oral Daily  . zolpidem  5 mg Oral QHS   Continuous Infusions: PRN Meds:.acetaminophen, albuterol, alum & mag hydroxide-simeth, HYDROmorphone (DILAUDID) injection, HYDROmorphone, lidocaine, LORazepam, methocarbamol  Micro Results Recent Results (from the past 240 hour(s))  Culture, Urine     Status: Abnormal   Collection Time: 04/30/18  2:32 PM  Result Value Ref Range Status   Specimen Description   Final    URINE, CLEAN CATCH Performed at [facility] at [facility], [address] [place], [place] [address]    Special Requests   Final    URINE, CLEAN CATCH Performed at [facility] at [facility], [address] [place], [place] [address]    Culture (A)  Final    <10,000 COLONIES/mL INSIGNIFICANT GROWTH Performed at [facility], [place] [address] [place], [place] [address]    Report Status 05/01/2018 FINAL  Final    Radiology Reports Ct Abdomen Pelvis W Contrast  Result Date: 05/05/2018 CLINICAL DATA:  Masses in the pelvis.  Pain  management. EXAM: CT ABDOMEN AND PELVIS WITH CONTRAST TECHNIQUE: Multidetector CT imaging of the abdomen and pelvis was performed using the standard protocol following bolus administration of intravenous contrast. CONTRAST:  16mL ISOVUE-300 IOPAMIDOL (ISOVUE-300) INJECTION 61% COMPARISON:  May 01, 2018 FINDINGS: Lower chest: Left effusion and adjacent platelike scar atelectasis is stable. AICD leads terminate in the right ventricle. Cardiomegaly again identified. Small hiatal hernia. No other changes in the lower chest. Hepatobiliary: Suspicion of a tiny nodule in the hepatic dome on series 2, image 14 not seen previously, measuring 5.7 mm. No other liver nodules or masses. Cholelithiasis. The portal vein is patent. Pancreas: Unremarkable. No pancreatic ductal dilatation or surrounding inflammatory changes. Spleen: Normal in size without focal abnormality. Adrenals/Urinary Tract: Adrenal glands are normal. Mild left hydronephrosis and mild left ureterectasis are more prominent in the interval. No hydronephrosis on the right. The bladder is unchanged. Stomach/Bowel: The stomach and small bowel are normal. The sigmoid colon cannot be separated from pelvic masses, similar in the interval. Postoperative changes seen in the sigmoid colon. No colonic obstruction. The remainder of the colon is otherwise unremarkable. The appendix is not well seen consistent with history of previous appendectomy. Vascular/Lymphatic: Atherosclerotic changes are seen in the nonaneurysmal aorta. Reproductive: The patient is status post hysterectomy. Other: The right  pericolic nodule measures 15 by 11 mm today versus 14 x 10 mm previously. Multiple pelvic masses are again identified. The mass inferior to the bifurcation measures 4.4 by 2.6 cm today versus 4.7 x 2.6 cm previously. The right adnexal low-density lesion measures 3.8 by 1.9 cm today versus 3.6 x 2.4 cm previously. The mass posterior to the bladder measures 5.5 x 4.5 cm today  versus 5.4 x 4.4 cm previously. The presacral mass measures 6.8 by 6.3 cm today versus 6.7 x 6.4 cm previously. Adjacent pelvic nodes are similar in the interval. Musculoskeletal: Previous right hip replacement. Degenerative changes in the left hip. No bony metastatic disease. IMPRESSION: 1. The right para colic nodule/mass and the multiple pelvic masses are similar in the interval. Slight differences in measurement could be due to difference in slice selection. Recommend continued attention on follow-up. 2. Mild left hydronephrosis, new in the interval, likely due to compression of the distal left ureter by 1 or more of the pelvic masses. No obstruction on the right. 3. Suspicion of a new nodule in the hepatic dome as above. Recommend attention on follow-up. Alternatively, an MRI could further assess if clinically warranted. 4. Atherosclerotic changes in the nonaneurysmal aorta. 5. Stable left effusion and platelike scarring/atelectasis. Electronically Signed   By: Dorise Bullion III M.D   On: 05/05/2018 00:08   Ct Abdomen Pelvis W Contrast  Result Date: 05/01/2018 CLINICAL DATA:  Pain in the region of tailbone biopsy EXAM: CT ABDOMEN AND PELVIS WITH CONTRAST TECHNIQUE: Multidetector CT imaging of the abdomen and pelvis was performed using the standard protocol following bolus administration of intravenous contrast. CONTRAST:  160mL OMNIPAQUE IOHEXOL 300 MG/ML  SOLN COMPARISON:  05/01/2018, 04/08/2018, 08/26/2017, 06/03/2017 CT FINDINGS: Lower chest: Lung bases again demonstrate small left pleural effusion with platelike consolidation in the posterior peripheral left lung base, similar as compared with 04/08/2018. Partially visualized cardiac pacing leads. Borderline cardiomegaly. Hepatobiliary: No focal liver abnormality is seen. No gallstones, gallbladder wall thickening, or biliary dilatation. Pancreas: Unremarkable. No pancreatic ductal dilatation or surrounding inflammatory changes. Spleen: Normal in size  without focal abnormality. Adrenals/Urinary Tract: Adrenal glands are unremarkable. Kidneys are normal, without renal calculi, focal lesion, or hydronephrosis. Mass-effect on the posterior bladder by pelvic mass. Stomach/Bowel: Stomach is nonenlarged. No dilated small bowel. No colon wall thickening. Postsurgical changes of the rectosigmoid colon. Sigmoid colon difficult to separate from central pelvic mass. Vascular/Lymphatic: Moderate aortic atherosclerosis. No aneurysmal dilatation. Reproductive: History of hysterectomy and oophorectomy on epic. Other: Right paracolic gutter lesion measures 14 x 10 mm compared with 14 x 17 mm previously. Multiple pelvic masses are again identified. Mass inferior to the iliac bifurcation measures 47 x 26 mm compared with 51 x 24 mm previously. Right adnexal low-density lesion measures 36 x 24 mm compared with 34 x 37 mm previously. Central low pelvic mass posterior to the bladder measures 44 x 54 mm, previously 54 x 45 mm. Increased size of presacral lesion, this measures 64 x 67 mm, compared with 51 x 37 mm. Surrounding hazy attenuation, may be secondary to post biopsy change or inflammation of the mass. There is mass effect on the adjacent rectum. Size of this mass does not appear significantly changed when compared to the biopsy images performed today. Multiple additional presacral masses are noted. There are multiple enhancing nodules in the right pelvic fat. There is edema in the perirectal space. Musculoskeletal: No acute or suspicious osseous abnormality. Prior right hip replacement. IMPRESSION: 1. Multiple known pelvic masses. Significant  increase in size of the biopsied presacral lesion as compared with 04/08/2018. There is soft tissue stranding surrounding the mass which may be secondary to post biopsy change or inflammation of the mass. This mass exerts mass effect on the rectum. There is perirectal edema. There are multiple additional pelvic masses which are grossly  stable in size since the comparison CT. The mass situated posterior to the bladder is difficult to separate from adjacent sigmoid colon and question invasion. 2. Similar appearance of small left pleural effusion with platelike consolidation in the peripheral left lung base. Electronically Signed   By: Donavan Foil M.D.   On: 05/01/2018 18:58   Ct Biopsy  Result Date: 05/01/2018 INDICATION: 57 year old female with a history of granulosa cell tumor of the ovary, unspecified laterality. She has demonstrated evidence of progressive disease on her most recent CT imaging and presents for CT-guided biopsy of the same. Of note, she has had progressive severe pelvic pain over the past 5 days which is unrelieved by Percocet. She intends on going to the emergency room following her procedure and recovery today for further evaluation and management. I have contacted her oncologist, Dr. Marin Olp, and he is aware and agrees with her visiting the emergency room. The patient still desires to proceed with the biopsy in order to get tissue for molecular studies. EXAM: CT BIOPSY MEDICATIONS: None. ANESTHESIA/SEDATION: Moderate (conscious) sedation was employed during this procedure. A total of Versed 1 mg and Fentanyl 50 mcg was administered intravenously. Moderate Sedation Time: 15 minutes. The patient's level of consciousness and vital signs were monitored continuously by radiology nursing throughout the procedure under my direct supervision. FLUOROSCOPY TIME:  Fluoroscopy Time: 0 minutes 0 seconds (0 mGy). COMPLICATIONS: None immediate. PROCEDURE: Informed written consent was obtained from the patient after a thorough discussion of the procedural risks, benefits and alternatives. All questions were addressed. A timeout was performed prior to the initiation of the procedure. A planning axial CT scan was performed. The heterogeneous mass in the presacral space was successfully identified. A suitable skin entry site was selected  and marked. The region was sterilely prepped and draped in standard fashion using chlorhexidine skin prep. Local anesthesia was attained by infiltration with 1% lidocaine. A small dermatotomy was made. Under intermittent CT guidance, an 18 gauge introducer needle was advanced into the margin of the mass. Multiple 18 gauge core biopsies were then coaxially obtained using the bio Pince automated biopsy device. Biopsy specimens were placed in formalin and delivered to pathology for further analysis. The introducer needle was removed. Post biopsy axial CT imaging demonstrates no evidence of immediate complication. The patient tolerated the procedure well. IMPRESSION: Technically successful CT-guided core biopsy of presacral mass. Electronically Signed   By: Jacqulynn Cadet M.D.   On: 05/01/2018 13:44    Time Spent in minutes  25   Ricketta Colantonio M.D on 05/08/2018 at 1:26 PM  Between 7am to 7pm - Pager - [phone]  After 7pm go to [email or web address] - password [facility]  [facility] -  Office  [phone]

## 2018-05-08 NOTE — Progress Notes (Signed)
Ms. Solar is doing a little bit better.  She still having quite a bit of pain.  She is still getting pain medication every 2 hours.  I will have to increase the dose of OxyContin up to 20 mg twice a day.  I think she started radiation yesterday.  I believe that we need to consider combination radiation and chemotherapy for her recurrent granulosa cell tumor.  I think that this would be the best means of trying to get a quick response.  She has not had chemotherapy for about 5 years or so.  Granulosa cell tumors are sensitive to chemotherapy.  She has had platinum based therapy in the past.  I would use low-dose cis-platinum with low-dose etoposide in combination with radiation therapy.  She will need to have a Port-A-Cath placed.  She is on Coumadin right now.  We are going to have to get her off Coumadin and have her on Lovenox until the Port-A-Cath is placed.  I would like to get the Port-A-Cath placed on Monday which probably is the soonest that he can be placed.  I would then start low-dose chemotherapy on Tuesday for 3 days of treatment.  I talked to Ms. Balash about this.  She understands the rationale behind combination therapy.  I think she could tolerate this incredibly well.  She still wants to be aggressive.  Again, since she really has not had chemotherapy for 5 years or more, I think that it would be very worthwhile to do combination therapy.  Given that she has to be on Lovenox and off Coumadin, I figure that she is  Have to be in the hospital while she has the "bridging" until she can get back onto Coumadin.  She is had no labs in couple days.  I will have to get labs set up for the weekend.  Her appetite is better.  She had a bowel movement yesterday.  She is had no bleeding.  There is been no fever.  Her vital signs all look stable.  Her blood pressure might be a little on the lower side at 98/54.  Her lungs are clear.  Cardiac exam regular rate and rhythm.  Abdomen is soft.   There is not as much distention.  Bowel sounds are present.  Extremities shows no clubbing, cyanosis or edema.  I think we now have a good "game plan" for Ms. Hetz.  I think this will be aggressive and she is in favor of this.  I believe that she deserves to have aggressive intervention given her good performance status.  I appreciate the great care that she is gotten from everybody up on 5 E.  Lattie Haw, MD  Vonna Kotyk 1:9

## 2018-05-08 NOTE — Progress Notes (Signed)
[place] for LMWH  Indication: MVR bridge therapy  Allergies  Allergen Reactions  . Prochlorperazine Edisylate Other (See Comments)    Nervous/ flutter/ shakes--compazine  . Adhesive [Tape] Other (See Comments)    Redness/skin peeling Redness/hives from adhesive tape( tolerates latex gloves); tolerates paper tape  . Prochlorperazine Other (See Comments)    Jitters, hyper    Patient Measurements: Height: 5\' 6"  (167.6 cm) Weight: 197 lb (89.4 kg) IBW/kg (Calculated) : 59.3 Heparin Dosing Weight: 78.7 kg  Vital Signs: Temp: 98.1 F (36.7 C) (11/15 0736) Temp Source: Oral (11/15 0515) BP: 106/54 (11/15 0736) Pulse Rate: 83 (11/15 0736)  Labs: Recent Labs    05/05/18 1250 05/06/18 0539 05/07/18 0543 05/08/18 0611  HGB  --  11.5*  --   --   HCT  --  36.2  --   --   PLT  --  194  --   --   LABPROT 19.0* 22.9* 24.9* 25.7*  INR 1.61 2.05 2.29 2.39  CREATININE 0.96 0.92  --   --     Estimated Creatinine Clearance: 75 mL/min (by C-G formula based on SCr of 0.92 mg/dL).   Assessment: 57 yo F on coumadin/LMWH bridge PTA for transcatheter mitral valve in valve replacement and PAF.   Home dose is 5 mg qd X 7.5 MWF, on LMWH 80 q12 ended 11/11. Last dose warf 11/11 at 1800 INR 1.61 on admissionwhich is below coumadin clinic goal of 2-3.  Hg 11.7, PLTC 195. Wt 78.7 11/11 > 89.4 11/12.  05/08/2018  INR 2.39 therapeutic. CBC stable.  No bleeding reported. Gyn oncology rec no debulking surgery.   Goal of Therapy:  INR 2-3 Monitor platelets by anticoagulation protocol: Yes   Plan:  Resume home dose of coumadin 7.5 mg MWF and 5 mg all other days Daily INR  Eudelia Bunch, Pharm.D [phone] 05/08/2018 7:44 AM   Addendum: Coumadin dc'd and vitamin K 5 mg PO ordered by Dr. Marin Olp to reverse coumadin for [facility] placment on Monday for chemo. LMWH 1 mg/kg sq q12 ordered by Dr. Marin Olp Continue daily INR for now to ensure INR OK on  Monday for PAC placement  Eudelia Bunch, Pharm.D [phone] 05/08/2018 7:44 AM

## 2018-05-08 NOTE — Progress Notes (Addendum)
[place] for LMWH and heparin Indication: MVR bridge therapy  Allergies  Allergen Reactions  . Prochlorperazine Edisylate Other (See Comments)    Nervous/ flutter/ shakes--compazine  . Adhesive [Tape] Other (See Comments)    Redness/skin peeling Redness/hives from adhesive tape( tolerates latex gloves); tolerates paper tape  . Prochlorperazine Other (See Comments)    Jitters, hyper    Patient Measurements: Height: 5\' 6"  (167.6 cm) Weight: 197 lb (89.4 kg) IBW/kg (Calculated) : 59.3 Heparin Dosing Weight: 78.7 kg  Vital Signs: Temp: 98.1 F (36.7 C) (11/15 0515) Temp Source: Oral (11/15 0515) BP: 98/54 (11/15 0515) Pulse Rate: 81 (11/15 0515)  Labs: Recent Labs    05/05/18 1250 05/06/18 0539 05/07/18 0543 05/08/18 0611  HGB  --  11.5*  --   --   HCT  --  36.2  --   --   PLT  --  194  --   --   LABPROT 19.0* 22.9* 24.9* 25.7*  INR 1.61 2.05 2.29 2.39  CREATININE 0.96 0.92  --   --     Estimated Creatinine Clearance: 75 mL/min (by C-G formula based on SCr of 0.92 mg/dL).   Assessment: 57 yo F on coumadin/LMWH bridge PTA for transcatheter mitral valve in valve replacement and PAF.   Home dose is 5 mg qd X 7.5 MWF, on LMWH 80 q12 ended 11/11. Last dose warf 11/11 at 1800 INR 1.61 on admissionwhich is below coumadin clinic goal of 2-3.  Hg 11.7, PLTC 195. Wt 78.7 11/11 > 89.4 11/12.  05/08/2018  INR 2.39 therapeutic. CBC stable.  No bleeding reported. Gyn oncology rec no debulking surgery.   Goal of Therapy:  INR 2-3 Monitor platelets by anticoagulation protocol: Yes   Plan:  Resume home dose of coumadin 7.5 mg MWF and 5 mg all other days Daily INR  Eudelia Bunch, Pharm.D [phone] 05/08/2018 7:18 AM   Addendum: Coumadin dc'd and vitamin K 5 mg PO ordered by Dr. Marin Olp to reverse coumadin for [facility] placment on Monday for chemo. LMWH 1 mg/kg sq q12 ordered by Dr. Marin Olp Continue daily INR for now to ensure INR  OK on Monday for PAC placement  Eudelia Bunch, Pharm.D [phone] 05/08/2018 7:41 AM

## 2018-05-08 NOTE — Progress Notes (Signed)
Patient states pain seems to be improved this am.  Dr. Marin Olp adjusted the dose this am to 20 mg BID. States she was having episodes of loose, incontinent stools last pm which she relates to the miralax.  Radiation therapy went well yesterday.  She states Dr. Marin Olp is planning on having her stay in the hospital to have port placed then receive first cycle of chemo while inpatient.  No concerns voiced. Advised her that a follow up was arranged to meet with Dr. Fermin Schwab in the office on May 19, 2018.

## 2018-05-09 DIAGNOSIS — R1030 Lower abdominal pain, unspecified: Secondary | ICD-10-CM

## 2018-05-09 LAB — PROTIME-INR
INR: 1.22
Prothrombin Time: 15.3 seconds — ABNORMAL HIGH (ref 11.4–15.2)

## 2018-05-09 LAB — COMPREHENSIVE METABOLIC PANEL
ALBUMIN: 3.3 g/dL — AB (ref 3.5–5.0)
ALK PHOS: 58 U/L (ref 38–126)
ALT: 18 U/L (ref 0–44)
AST: 17 U/L (ref 15–41)
Anion gap: 11 (ref 5–15)
BILIRUBIN TOTAL: 0.8 mg/dL (ref 0.3–1.2)
BUN: 9 mg/dL (ref 6–20)
CALCIUM: 8.9 mg/dL (ref 8.9–10.3)
CO2: 29 mmol/L (ref 22–32)
Chloride: 96 mmol/L — ABNORMAL LOW (ref 98–111)
Creatinine, Ser: 1.05 mg/dL — ABNORMAL HIGH (ref 0.44–1.00)
GFR calc Af Amer: >60 mL/min (ref >60)
GFR calc non Af Amer: 57 mL/min — ABNORMAL LOW (ref >60)
GLUCOSE: 104 mg/dL — AB (ref 70–99)
POTASSIUM: 3.3 mmol/L — AB (ref 3.5–5.1)
Sodium: 136 mmol/L (ref 135–145)
TOTAL PROTEIN: 6.7 g/dL (ref 6.5–8.1)

## 2018-05-09 LAB — CBC WITH DIFFERENTIAL/PLATELET
Abs Immature Granulocytes: 0.07 10*3/uL (ref 0.00–0.07)
BASOS ABS: 0 10*3/uL (ref 0.0–0.1)
Basophils Relative: 0 %
Eosinophils Absolute: 0.1 10*3/uL (ref 0.0–0.5)
Eosinophils Relative: 1 %
HEMATOCRIT: 34.8 % — AB (ref 36.0–46.0)
Hemoglobin: 11 g/dL — ABNORMAL LOW (ref 12.0–15.0)
IMMATURE GRANULOCYTES: 1 %
LYMPHS ABS: 1.2 10*3/uL (ref 0.7–4.0)
Lymphocytes Relative: 10 %
MCH: 29.5 pg (ref 26.0–34.0)
MCHC: 31.6 g/dL (ref 30.0–36.0)
MCV: 93.3 fL (ref 80.0–100.0)
Monocytes Absolute: 1.2 10*3/uL — ABNORMAL HIGH (ref 0.1–1.0)
Monocytes Relative: 10 %
NEUTROS ABS: 9.4 10*3/uL — AB (ref 1.7–7.7)
NEUTROS PCT: 78 %
NRBC: 0 % (ref 0.0–0.2)
Platelets: 215 10*3/uL (ref 150–400)
RBC: 3.73 MIL/uL — ABNORMAL LOW (ref 3.87–5.11)
RDW: 14.6 % (ref 11.5–15.5)
WBC: 12 10*3/uL — ABNORMAL HIGH (ref 4.0–10.5)

## 2018-05-09 MED ORDER — POTASSIUM CHLORIDE CRYS ER 20 MEQ PO TBCR
40.0000 meq | EXTENDED_RELEASE_TABLET | Freq: Once | ORAL | Status: AC
  Administered 2018-05-09: 40 meq via ORAL
  Filled 2018-05-09: qty 2

## 2018-05-09 NOTE — Progress Notes (Signed)
PROGRESS NOTE                                                                                                                                                                                                             Patient Demographics:    [name], is a 57 y.o. female, DOB - Jul 28, 1960, QTM:[id number]  Admit date - 05/04/2018   Admitting Physician Rise Patience, MD  Outpatient Primary MD for the patient is Default, Provider, MD  LOS - 3  Outpatient Specialists: Dr Mosetta Anis  Chief Complaint  Patient presents with  . 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  She had a radical debulking along with rectosigmoid resection with low rectal anastomosis, right distal ureter resection, ureteroneocystostom and omentectomy in 2014 followed by excellent chemotherapy.  She had exploratory laparotomy with radical resection of recurrent tumor in 2017.  Patient now following with Dr. Marin Olp and presenting with severe persistent abdominal pain.  Patient had a recent retroperitoneal soft tissue biopsy done on 11/8 showing granulosa cell tumor and CT of the abdomen showing multiple pelvic masses and mild left hydronephrosis with suspicious new nodule in the hepatic dome. Patient admitted for adequate pain control.    Subjective:   Abdominal pain better.  Reports her pain is mainly in the lower pelvis and the tailbone.  Assessment  & Plan :    Principal Problem:   Intractable abdominal pain Secondary to recurrence of her granulosa cell tumor with multiple pelvic masses, mild left hydronephrosis and possible new hepatic lesion. OxyContin dose increased to 20 mg twice daily with pain better controlled.  Requiring IV Dilaudid less frequently.  Still on oral Dilaudid 2 mg every 4 hours.   Pain overall much better.  GYN oncology consulted, who after discussing with patient's surgeon (Dr. Aldean Ast) at [facility] recommended palliative radiation.  Simulation followed by plan on 5 weeks of radiation directed at the pelvis.    Active Problems: Recurrence of granulosa cell tumor. Plan as above.  Oncology, radiation onc and GYN-onc on board.  Plan on palliative radiation as above.Oncology recommends Port-A-Cath placement with plan on starting low-dose chemotherapy.  Held Coumadin and placed on subcu Lovenox.  (Lovenox will  be held tomorrow).  History of chronic CHF Last echo in May 2019 with EF of 45%.  Also has history of V. tach status post ICD.  Currently euvolemic.  Continue home diuretic.  History of mitral valve repair Following MI with postinfarction papillary muscle rupture in 2011.  INR therapeutic.  Coumadin held for Port-A-Cath placement early next week and placed on Lovenox.  Mild left-sided hydronephrosis As seen on CT.  Monitor renal function.  Voiding without difficulty.  Chronic kidney disease stage III Stable at baseline  COPD Stable.  Continue home meds.  Hypokalemia Replenished  ?  Paroxysmal A. fib Patient denies history of it.  On Coumadin for mitral valve repair.        Code Status : Full code  Family Communication  : None at bedside  Disposition Plan  : Home early next week after Port-A-Cath placement and chemotherapy initiated.  Barriers For Discharge : Active symptoms  Consults  : Oncology, GYN-onc  Procedures  : CT abdomen and pelvis DVT prophylaxis: Therapeutic Lovenox  Lab Results  Component Value Date   PLT 215 05/09/2018    Antibiotics  :   Anti-infectives (From admission, onward)   Start     Dose/Rate Route Frequency Ordered Stop   05/11/18 1500  ceFAZolin (ANCEF) IVPB 2g/100 mL premix     2 g 200 mL/hr over 30 Minutes Intravenous To Radiology 05/08/18 1417 05/12/18 1500        Objective:   Vitals:   05/08/18  1218 05/08/18 1956 05/09/18 0609 05/09/18 0819  BP: (!) 100/55 (!) 98/51 (!) 89/48 115/66  Pulse: 80 80 80 91  Resp:  18 20   Temp: 98 F (36.7 C) 98.4 F (36.9 C) 98 F (36.7 C)   TempSrc: Oral Oral    SpO2: 100% 98% 94%   Weight:      Height:        Wt Readings from Last 3 Encounters:  05/04/18 89.4 kg  05/01/18 89.4 kg  05/01/18 89.4 kg     Intake/Output Summary (Last 24 hours) at 05/09/2018 1059 Last data filed at 05/09/2018 0830 Gross per 24 hour  Intake 240 ml  Output -  Net 240 ml   Physical exam Not in distress HEENT: Moist mucosa, supple neck Chest: Clear CVS: Normal S1-S2 GI: Soft, nondistended, nontender Muscular skeletal: Warm, no edema      Data Review:    CBC Recent Labs  Lab 05/04/18 2221 05/06/18 0539 05/09/18 0552  WBC 14.8* 10.1 12.0*  HGB 11.7* 11.5* 11.0*  HCT 35.3* 36.2 34.8*  PLT 195 194 215  MCV 90.3 92.8 93.3  MCH 29.9 29.5 29.5  MCHC 33.1 31.8 31.6  RDW 14.9 15.0 14.6  LYMPHSABS  --   --  1.2  MONOABS  --   --  1.2*  EOSABS  --   --  0.1  BASOSABS  --   --  0.0    Chemistries  Recent Labs  Lab 05/04/18 2245 05/05/18 1250 05/06/18 0539 05/09/18 0552  NA 136 138 137 136  K 2.9* 3.1* 3.6 3.3*  CL 99 101 101 96*  CO2 26 26 27 29   GLUCOSE 109* 93 104* 104*  BUN 13 10 10 9   CREATININE 1.03* 0.96 0.92 1.05*  CALCIUM 8.8* 8.8* 8.8* 8.9  MG  --  1.9  --   --   AST  --   --   --  17  ALT  --   --   --  18  ALKPHOS  --   --   --  69  BILITOT  --   --   --  0.8   ------------------------------------------------------------------------------------------------------------------ No results for input(s): CHOL, HDL, LDLCALC, TRIG, CHOLHDL, LDLDIRECT in the last 72 hours.  Lab Results  Component Value Date   HGBA1C 5.0 09/12/2017   ------------------------------------------------------------------------------------------------------------------ No results for input(s): TSH, T4TOTAL, T3FREE, THYROIDAB in the last 72  hours.  Invalid input(s): FREET3 ------------------------------------------------------------------------------------------------------------------ No results for input(s): VITAMINB12, FOLATE, FERRITIN, TIBC, IRON, RETICCTPCT in the last 72 hours.  Coagulation profile Recent Labs  Lab 05/05/18 1250 05/06/18 0539 05/07/18 0543 05/08/18 0611 05/09/18 0552  INR 1.61 2.05 2.29 2.39 1.22    No results for input(s): DDIMER in the last 72 hours.  Cardiac Enzymes No results for input(s): CKMB, TROPONINI, MYOGLOBIN in the last 168 hours.  Invalid input(s): CK ------------------------------------------------------------------------------------------------------------------    Component Value Date/Time   BNP 327.3 (H) 09/12/2017 6440    Inpatient Medications  Scheduled Meds: . aspirin EC  81 mg Oral Daily  . carvedilol  3.125 mg Oral BID WC  . enoxaparin (LOVENOX) injection  1 mg/kg Subcutaneous Q12H  . furosemide  20 mg Oral Daily  . gabapentin  300 mg Oral TID  . oxyCODONE  20 mg Oral Q12H  . polyethylene glycol  17 g Oral BID  . spironolactone  25 mg Oral Daily  . zolpidem  5 mg Oral QHS   Continuous Infusions: . [START ON 05/11/2018]  ceFAZolin (ANCEF) IV     PRN Meds:.acetaminophen, albuterol, alum & mag hydroxide-simeth, HYDROmorphone (DILAUDID) injection, HYDROmorphone, lidocaine, LORazepam, methocarbamol, phenylephrine-shark liver oil-mineral oil-petrolatum  Micro Results Recent Results (from the past 240 hour(s))  Culture, Urine     Status: Abnormal   Collection Time: 04/30/18  2:32 PM  Result Value Ref Range Status   Specimen Description   Final    URINE, CLEAN CATCH Performed at [facility] at [facility], [address] [place], [place] [address]    Special Requests   Final    URINE, CLEAN CATCH Performed at [facility] at [facility], [address], [place], [place] [address]    Culture (A)  Final      <10,000 COLONIES/mL INSIGNIFICANT GROWTH Performed at [facility], [place] [address], [place], [place] [address]    Report Status 05/01/2018 FINAL  Final    Radiology Reports Ct Abdomen Pelvis W Contrast  Result Date: 05/05/2018 CLINICAL DATA:  Masses in the pelvis.  Pain management. EXAM: CT ABDOMEN AND PELVIS WITH CONTRAST TECHNIQUE: Multidetector CT imaging of the abdomen and pelvis was performed using the standard protocol following bolus administration of intravenous contrast. CONTRAST:  112mL ISOVUE-300 IOPAMIDOL (ISOVUE-300) INJECTION 61% COMPARISON:  May 01, 2018 FINDINGS: Lower chest: Left effusion and adjacent platelike scar atelectasis is stable. AICD leads terminate in the right ventricle. Cardiomegaly again identified. Small hiatal hernia. No other changes in the lower chest. Hepatobiliary: Suspicion of a tiny nodule in the hepatic dome on series 2, image 14 not seen previously, measuring 5.7 mm. No other liver nodules or masses. Cholelithiasis. The portal vein is patent. Pancreas: Unremarkable. No pancreatic ductal dilatation or surrounding inflammatory changes. Spleen: Normal in size without focal abnormality. Adrenals/Urinary Tract: Adrenal glands are normal. Mild left hydronephrosis and mild left ureterectasis are more prominent in the interval. No hydronephrosis on the right. The bladder is unchanged. Stomach/Bowel: The stomach and small bowel are normal. The  sigmoid colon cannot be separated from pelvic masses, similar in the interval. Postoperative changes seen in the sigmoid colon. No colonic obstruction. The remainder of the colon is otherwise unremarkable. The appendix is not well seen consistent with history of previous appendectomy. Vascular/Lymphatic: Atherosclerotic changes are seen in the nonaneurysmal aorta. Reproductive: The patient is status post hysterectomy. Other: The right pericolic nodule measures 15 by 11 mm today versus 14 x 10 mm previously. Multiple  pelvic masses are again identified. The mass inferior to the bifurcation measures 4.4 by 2.6 cm today versus 4.7 x 2.6 cm previously. The right adnexal low-density lesion measures 3.8 by 1.9 cm today versus 3.6 x 2.4 cm previously. The mass posterior to the bladder measures 5.5 x 4.5 cm today versus 5.4 x 4.4 cm previously. The presacral mass measures 6.8 by 6.3 cm today versus 6.7 x 6.4 cm previously. Adjacent pelvic nodes are similar in the interval. Musculoskeletal: Previous right hip replacement. Degenerative changes in the left hip. No bony metastatic disease. IMPRESSION: 1. The right para colic nodule/mass and the multiple pelvic masses are similar in the interval. Slight differences in measurement could be due to difference in slice selection. Recommend continued attention on follow-up. 2. Mild left hydronephrosis, new in the interval, likely due to compression of the distal left ureter by 1 or more of the pelvic masses. No obstruction on the right. 3. Suspicion of a new nodule in the hepatic dome as above. Recommend attention on follow-up. Alternatively, an MRI could further assess if clinically warranted. 4. Atherosclerotic changes in the nonaneurysmal aorta. 5. Stable left effusion and platelike scarring/atelectasis. Electronically Signed   By: Dorise Bullion III M.D   On: 05/05/2018 00:08   Ct Abdomen Pelvis W Contrast  Result Date: 05/01/2018 CLINICAL DATA:  Pain in the region of tailbone biopsy EXAM: CT ABDOMEN AND PELVIS WITH CONTRAST TECHNIQUE: Multidetector CT imaging of the abdomen and pelvis was performed using the standard protocol following bolus administration of intravenous contrast. CONTRAST:  1108mL OMNIPAQUE IOHEXOL 300 MG/ML  SOLN COMPARISON:  05/01/2018, 04/08/2018, 08/26/2017, 06/03/2017 CT FINDINGS: Lower chest: Lung bases again demonstrate small left pleural effusion with platelike consolidation in the posterior peripheral left lung base, similar as compared with 04/08/2018.  Partially visualized cardiac pacing leads. Borderline cardiomegaly. Hepatobiliary: No focal liver abnormality is seen. No gallstones, gallbladder wall thickening, or biliary dilatation. Pancreas: Unremarkable. No pancreatic ductal dilatation or surrounding inflammatory changes. Spleen: Normal in size without focal abnormality. Adrenals/Urinary Tract: Adrenal glands are unremarkable. Kidneys are normal, without renal calculi, focal lesion, or hydronephrosis. Mass-effect on the posterior bladder by pelvic mass. Stomach/Bowel: Stomach is nonenlarged. No dilated small bowel. No colon wall thickening. Postsurgical changes of the rectosigmoid colon. Sigmoid colon difficult to separate from central pelvic mass. Vascular/Lymphatic: Moderate aortic atherosclerosis. No aneurysmal dilatation. Reproductive: History of hysterectomy and oophorectomy on epic. Other: Right paracolic gutter lesion measures 14 x 10 mm compared with 14 x 17 mm previously. Multiple pelvic masses are again identified. Mass inferior to the iliac bifurcation measures 47 x 26 mm compared with 51 x 24 mm previously. Right adnexal low-density lesion measures 36 x 24 mm compared with 34 x 37 mm previously. Central low pelvic mass posterior to the bladder measures 44 x 54 mm, previously 54 x 45 mm. Increased size of presacral lesion, this measures 64 x 67 mm, compared with 51 x 37 mm. Surrounding hazy attenuation, may be secondary to post biopsy change or inflammation of the mass. There is mass effect on  the adjacent rectum. Size of this mass does not appear significantly changed when compared to the biopsy images performed today. Multiple additional presacral masses are noted. There are multiple enhancing nodules in the right pelvic fat. There is edema in the perirectal space. Musculoskeletal: No acute or suspicious osseous abnormality. Prior right hip replacement. IMPRESSION: 1. Multiple known pelvic masses. Significant increase in size of the biopsied  presacral lesion as compared with 04/08/2018. There is soft tissue stranding surrounding the mass which may be secondary to post biopsy change or inflammation of the mass. This mass exerts mass effect on the rectum. There is perirectal edema. There are multiple additional pelvic masses which are grossly stable in size since the comparison CT. The mass situated posterior to the bladder is difficult to separate from adjacent sigmoid colon and question invasion. 2. Similar appearance of small left pleural effusion with platelike consolidation in the peripheral left lung base. Electronically Signed   By: Donavan Foil M.D.   On: 05/01/2018 18:58   Ct Biopsy  Result Date: 05/01/2018 INDICATION: 57 year old female with a history of granulosa cell tumor of the ovary, unspecified laterality. She has demonstrated evidence of progressive disease on her most recent CT imaging and presents for CT-guided biopsy of the same. Of note, she has had progressive severe pelvic pain over the past 5 days which is unrelieved by Percocet. She intends on going to the emergency room following her procedure and recovery today for further evaluation and management. I have contacted her oncologist, Dr. Marin Olp, and he is aware and agrees with her visiting the emergency room. The patient still desires to proceed with the biopsy in order to get tissue for molecular studies. EXAM: CT BIOPSY MEDICATIONS: None. ANESTHESIA/SEDATION: Moderate (conscious) sedation was employed during this procedure. A total of Versed 1 mg and Fentanyl 50 mcg was administered intravenously. Moderate Sedation Time: 15 minutes. The patient's level of consciousness and vital signs were monitored continuously by radiology nursing throughout the procedure under my direct supervision. FLUOROSCOPY TIME:  Fluoroscopy Time: 0 minutes 0 seconds (0 mGy). COMPLICATIONS: None immediate. PROCEDURE: Informed written consent was obtained from the patient after a thorough discussion  of the procedural risks, benefits and alternatives. All questions were addressed. A timeout was performed prior to the initiation of the procedure. A planning axial CT scan was performed. The heterogeneous mass in the presacral space was successfully identified. A suitable skin entry site was selected and marked. The region was sterilely prepped and draped in standard fashion using chlorhexidine skin prep. Local anesthesia was attained by infiltration with 1% lidocaine. A small dermatotomy was made. Under intermittent CT guidance, an 18 gauge introducer needle was advanced into the margin of the mass. Multiple 18 gauge core biopsies were then coaxially obtained using the bio Pince automated biopsy device. Biopsy specimens were placed in formalin and delivered to pathology for further analysis. The introducer needle was removed. Post biopsy axial CT imaging demonstrates no evidence of immediate complication. The patient tolerated the procedure well. IMPRESSION: Technically successful CT-guided core biopsy of presacral mass. Electronically Signed   By: Jacqulynn Cadet M.D.   On: 05/01/2018 13:44    Time Spent in minutes  25   Sugar Vanzandt M.D on 05/09/2018 at 10:59 AM  Between 7am to 7pm - Pager - [phone]  After 7pm go to [email or web address] - password [facility]  [facility] -  Office  [phone]

## 2018-05-10 LAB — PROTIME-INR
INR: 1.1
PROTHROMBIN TIME: 14.1 s (ref 11.4–15.2)

## 2018-05-10 MED ORDER — ALPRAZOLAM 1 MG PO TABS
1.0000 mg | ORAL_TABLET | Freq: Once | ORAL | Status: AC
  Administered 2018-05-10: 1 mg via ORAL
  Filled 2018-05-10: qty 1

## 2018-05-10 NOTE — Progress Notes (Signed)
PROGRESS NOTE                                                                                                                                                                                                             Patient Demographics:    [name], is a 57 y.o. female, DOB - Nov 11, 1960, VOJ:[id number]  Admit date - 05/04/2018   Admitting Physician Rise Patience, MD  Outpatient Primary MD for the patient is Default, Provider, MD  LOS - 4  Outpatient Specialists: Dr Mosetta Anis  Chief Complaint  Patient presents with  . 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  She had a radical debulking along with rectosigmoid resection with low rectal anastomosis, right distal ureter resection, ureteroneocystostom and omentectomy in 2014 followed by excellent chemotherapy.  She had exploratory laparotomy with radical resection of recurrent tumor in 2017.  Patient now following with Dr. Marin Olp and presenting with severe persistent abdominal pain.  Patient had a recent retroperitoneal soft tissue biopsy done on 11/8 showing granulosa cell tumor and CT of the abdomen showing multiple pelvic masses and mild left hydronephrosis with suspicious new nodule in the hepatic dome. Patient admitted for adequate pain control.    Subjective:   Had abdominal pain overnight saw her this morning.  However has not been requiring IV Dilaudid that frequently and feels pain to have improved in the past 48 hours.  Assessment  & Plan :    Principal Problem:   Intractable abdominal pain Secondary to recurrence of her granulosa cell tumor with multiple pelvic masses, mild left hydronephrosis and possible new hepatic lesion. OxyContin dose increased to 20 mg twice daily with pain better controlled.   Requiring IV Dilaudid less frequently.  Still on oral Dilaudid 2 mg every 4 hours.  Pain overall much better.  GYN oncology consulted, who after discussing with patient's surgeon (Dr. Aldean Ast) at [facility] recommended palliative radiation.  Simulation followed by plan on 5 weeks of radiation directed at the pelvis.    Active Problems: Recurrence of granulosa cell tumor. Plan as above.  Oncology, radiation onc and GYN-onc on board.  Plan on palliative radiation as above.Oncology recommends Port-A-Cath placement with plan on starting  low-dose chemotherapy.  Coumadin and Lovenox on hold for Port-A-Cath placement.  History of chronic CHF Last echo in May 2019 with EF of 45%.  Also has history of V. tach status post ICD.  Currently euvolemic.  Continue home diuretic.  History of mitral valve repair Following MI with postinfarction papillary muscle rupture in 2011.  INR therapeutic.  Coumadin held for Port-A-Cath placement.  Mild left-sided hydronephrosis As seen on CT.  Monitor renal function.  Voiding without difficulty.  Chronic kidney disease stage III Stable at baseline  COPD Stable.  Continue home meds.  Hypokalemia Replenished  ?  Paroxysmal A. fib Patient denies history of it.  On Coumadin for mitral valve repair.        Code Status : Full code  Family Communication  : None at bedside  Disposition Plan  : Home possibly tomorrow after Port-A-Cath placement.  Initiate chemotherapy thereafter..  Barriers For Discharge : Active symptoms  Consults  : Oncology, GYN-onc  Procedures  : CT abdomen and pelvis DVT prophylaxis: Therapeutic Lovenox  Lab Results  Component Value Date   PLT 215 05/09/2018    Antibiotics  :   Anti-infectives (From admission, onward)   Start     Dose/Rate Route Frequency Ordered Stop   05/11/18 1500  ceFAZolin (ANCEF) IVPB 2g/100 mL premix     2 g 200 mL/hr over 30 Minutes Intravenous To Radiology 05/08/18 1417 05/12/18 1500         Objective:   Vitals:   05/09/18 1405 05/09/18 2038 05/09/18 2229 05/10/18 0527  BP: 104/60 (!) 97/57 106/77 98/64  Pulse: 81 87  89  Resp: 18 19  19   Temp: 97.9 F (36.6 C) 98.3 F (36.8 C)  97.9 F (36.6 C)  TempSrc:  Oral  Oral  SpO2: 100% 99%  98%  Weight:      Height:        Wt Readings from Last 3 Encounters:  05/04/18 89.4 kg  05/01/18 89.4 kg  05/01/18 89.4 kg     Intake/Output Summary (Last 24 hours) at 05/10/2018 1229 Last data filed at 05/10/2018 0956 Gross per 24 hour  Intake 240 ml  Output -  Net 240 ml   Physical exam Not in distress HEENT: Moist mucosa, supple neck Chest: Clear bilaterally CVS: Normal S1 and S2 GI: Soft, nondistended, mild lower quadrant tenderness, bowel sounds present Musculoskeletal: Warm, no edema       Data Review:    CBC Recent Labs  Lab 05/04/18 2221 05/06/18 0539 05/09/18 0552  WBC 14.8* 10.1 12.0*  HGB 11.7* 11.5* 11.0*  HCT 35.3* 36.2 34.8*  PLT 195 194 215  MCV 90.3 92.8 93.3  MCH 29.9 29.5 29.5  MCHC 33.1 31.8 31.6  RDW 14.9 15.0 14.6  LYMPHSABS  --   --  1.2  MONOABS  --   --  1.2*  EOSABS  --   --  0.1  BASOSABS  --   --  0.0    Chemistries  Recent Labs  Lab 05/04/18 2245 05/05/18 1250 05/06/18 0539 05/09/18 0552  NA 136 138 137 136  K 2.9* 3.1* 3.6 3.3*  CL 99 101 101 96*  CO2 26 26 27 29   GLUCOSE 109* 93 104* 104*  BUN 13 10 10 9   CREATININE 1.03* 0.96 0.92 1.05*  CALCIUM 8.8* 8.8* 8.8* 8.9  MG  --  1.9  --   --   AST  --   --   --  17  ALT  --   --   --  18  ALKPHOS  --   --   --  58  BILITOT  --   --   --  0.8   ------------------------------------------------------------------------------------------------------------------ No results for input(s): CHOL, HDL, LDLCALC, TRIG, CHOLHDL, LDLDIRECT in the last 72 hours.  Lab Results  Component Value Date   HGBA1C 5.0 09/12/2017    ------------------------------------------------------------------------------------------------------------------ No results for input(s): TSH, T4TOTAL, T3FREE, THYROIDAB in the last 72 hours.  Invalid input(s): FREET3 ------------------------------------------------------------------------------------------------------------------ No results for input(s): VITAMINB12, FOLATE, FERRITIN, TIBC, IRON, RETICCTPCT in the last 72 hours.  Coagulation profile Recent Labs  Lab 05/06/18 0539 05/07/18 0543 05/08/18 0611 05/09/18 0552 05/10/18 0542  INR 2.05 2.29 2.39 1.22 1.10    No results for input(s): DDIMER in the last 72 hours.  Cardiac Enzymes No results for input(s): CKMB, TROPONINI, MYOGLOBIN in the last 168 hours.  Invalid input(s): CK ------------------------------------------------------------------------------------------------------------------    Component Value Date/Time   BNP 327.3 (H) 09/12/2017 1610    Inpatient Medications  Scheduled Meds: . aspirin EC  81 mg Oral Daily  . carvedilol  3.125 mg Oral BID WC  . furosemide  20 mg Oral Daily  . gabapentin  300 mg Oral TID  . oxyCODONE  20 mg Oral Q12H  . polyethylene glycol  17 g Oral BID  . spironolactone  25 mg Oral Daily  . zolpidem  5 mg Oral QHS   Continuous Infusions: . [START ON 05/11/2018]  ceFAZolin (ANCEF) IV     PRN Meds:.acetaminophen, albuterol, alum & mag hydroxide-simeth, HYDROmorphone (DILAUDID) injection, HYDROmorphone, lidocaine, LORazepam, methocarbamol, phenylephrine-shark liver oil-mineral oil-petrolatum  Micro Results Recent Results (from the past 240 hour(s))  Culture, Urine     Status: Abnormal   Collection Time: 04/30/18  2:32 PM  Result Value Ref Range Status   Specimen Description   Final    URINE, CLEAN CATCH Performed at [facility], [address], [place], [place] [address]    Special Requests   Final    URINE, CLEAN  CATCH Performed at [facility], [address], [place], [place] [address]    Culture (A)  Final    <10,000 COLONIES/mL INSIGNIFICANT GROWTH Performed at [facility], [place] [address] [place], [place] [address]    Report Status 05/01/2018 FINAL  Final    Radiology Reports Ct Abdomen Pelvis W Contrast  Result Date: 05/05/2018 CLINICAL DATA:  Masses in the pelvis.  Pain management. EXAM: CT ABDOMEN AND PELVIS WITH CONTRAST TECHNIQUE: Multidetector CT imaging of the abdomen and pelvis was performed using the standard protocol following bolus administration of intravenous contrast. CONTRAST:  16mL ISOVUE-300 IOPAMIDOL (ISOVUE-300) INJECTION 61% COMPARISON:  May 01, 2018 FINDINGS: Lower chest: Left effusion and adjacent platelike scar atelectasis is stable. AICD leads terminate in the right ventricle. Cardiomegaly again identified. Small hiatal hernia. No other changes in the lower chest. Hepatobiliary: Suspicion of a tiny nodule in the hepatic dome on series 2, image 14 not seen previously, measuring 5.7 mm. No other liver nodules or masses. Cholelithiasis. The portal vein is patent. Pancreas: Unremarkable. No pancreatic ductal dilatation or surrounding inflammatory changes. Spleen: Normal in size without focal abnormality. Adrenals/Urinary Tract: Adrenal glands are normal. Mild left hydronephrosis and mild left ureterectasis are more prominent in the interval. No hydronephrosis on the right. The bladder is unchanged. Stomach/Bowel: The stomach and small bowel are normal. The sigmoid  colon cannot be separated from pelvic masses, similar in the interval. Postoperative changes seen in the sigmoid colon. No colonic obstruction. The remainder of the colon is otherwise unremarkable. The appendix is not well seen consistent with history of previous appendectomy. Vascular/Lymphatic: Atherosclerotic changes are seen in the nonaneurysmal aorta. Reproductive: The  patient is status post hysterectomy. Other: The right pericolic nodule measures 15 by 11 mm today versus 14 x 10 mm previously. Multiple pelvic masses are again identified. The mass inferior to the bifurcation measures 4.4 by 2.6 cm today versus 4.7 x 2.6 cm previously. The right adnexal low-density lesion measures 3.8 by 1.9 cm today versus 3.6 x 2.4 cm previously. The mass posterior to the bladder measures 5.5 x 4.5 cm today versus 5.4 x 4.4 cm previously. The presacral mass measures 6.8 by 6.3 cm today versus 6.7 x 6.4 cm previously. Adjacent pelvic nodes are similar in the interval. Musculoskeletal: Previous right hip replacement. Degenerative changes in the left hip. No bony metastatic disease. IMPRESSION: 1. The right para colic nodule/mass and the multiple pelvic masses are similar in the interval. Slight differences in measurement could be due to difference in slice selection. Recommend continued attention on follow-up. 2. Mild left hydronephrosis, new in the interval, likely due to compression of the distal left ureter by 1 or more of the pelvic masses. No obstruction on the right. 3. Suspicion of a new nodule in the hepatic dome as above. Recommend attention on follow-up. Alternatively, an MRI could further assess if clinically warranted. 4. Atherosclerotic changes in the nonaneurysmal aorta. 5. Stable left effusion and platelike scarring/atelectasis. Electronically Signed   By: Dorise Bullion III M.D   On: 05/05/2018 00:08   Ct Abdomen Pelvis W Contrast  Result Date: 05/01/2018 CLINICAL DATA:  Pain in the region of tailbone biopsy EXAM: CT ABDOMEN AND PELVIS WITH CONTRAST TECHNIQUE: Multidetector CT imaging of the abdomen and pelvis was performed using the standard protocol following bolus administration of intravenous contrast. CONTRAST:  18mL OMNIPAQUE IOHEXOL 300 MG/ML  SOLN COMPARISON:  05/01/2018, 04/08/2018, 08/26/2017, 06/03/2017 CT FINDINGS: Lower chest: Lung bases again demonstrate small  left pleural effusion with platelike consolidation in the posterior peripheral left lung base, similar as compared with 04/08/2018. Partially visualized cardiac pacing leads. Borderline cardiomegaly. Hepatobiliary: No focal liver abnormality is seen. No gallstones, gallbladder wall thickening, or biliary dilatation. Pancreas: Unremarkable. No pancreatic ductal dilatation or surrounding inflammatory changes. Spleen: Normal in size without focal abnormality. Adrenals/Urinary Tract: Adrenal glands are unremarkable. Kidneys are normal, without renal calculi, focal lesion, or hydronephrosis. Mass-effect on the posterior bladder by pelvic mass. Stomach/Bowel: Stomach is nonenlarged. No dilated small bowel. No colon wall thickening. Postsurgical changes of the rectosigmoid colon. Sigmoid colon difficult to separate from central pelvic mass. Vascular/Lymphatic: Moderate aortic atherosclerosis. No aneurysmal dilatation. Reproductive: History of hysterectomy and oophorectomy on epic. Other: Right paracolic gutter lesion measures 14 x 10 mm compared with 14 x 17 mm previously. Multiple pelvic masses are again identified. Mass inferior to the iliac bifurcation measures 47 x 26 mm compared with 51 x 24 mm previously. Right adnexal low-density lesion measures 36 x 24 mm compared with 34 x 37 mm previously. Central low pelvic mass posterior to the bladder measures 44 x 54 mm, previously 54 x 45 mm. Increased size of presacral lesion, this measures 64 x 67 mm, compared with 51 x 37 mm. Surrounding hazy attenuation, may be secondary to post biopsy change or inflammation of the mass. There is mass effect on the  adjacent rectum. Size of this mass does not appear significantly changed when compared to the biopsy images performed today. Multiple additional presacral masses are noted. There are multiple enhancing nodules in the right pelvic fat. There is edema in the perirectal space. Musculoskeletal: No acute or suspicious osseous  abnormality. Prior right hip replacement. IMPRESSION: 1. Multiple known pelvic masses. Significant increase in size of the biopsied presacral lesion as compared with 04/08/2018. There is soft tissue stranding surrounding the mass which may be secondary to post biopsy change or inflammation of the mass. This mass exerts mass effect on the rectum. There is perirectal edema. There are multiple additional pelvic masses which are grossly stable in size since the comparison CT. The mass situated posterior to the bladder is difficult to separate from adjacent sigmoid colon and question invasion. 2. Similar appearance of small left pleural effusion with platelike consolidation in the peripheral left lung base. Electronically Signed   By: Donavan Foil M.D.   On: 05/01/2018 18:58   Ct Biopsy  Result Date: 05/01/2018 INDICATION: 57 year old female with a history of granulosa cell tumor of the ovary, unspecified laterality. She has demonstrated evidence of progressive disease on her most recent CT imaging and presents for CT-guided biopsy of the same. Of note, she has had progressive severe pelvic pain over the past 5 days which is unrelieved by Percocet. She intends on going to the emergency room following her procedure and recovery today for further evaluation and management. I have contacted her oncologist, Dr. Marin Olp, and he is aware and agrees with her visiting the emergency room. The patient still desires to proceed with the biopsy in order to get tissue for molecular studies. EXAM: CT BIOPSY MEDICATIONS: None. ANESTHESIA/SEDATION: Moderate (conscious) sedation was employed during this procedure. A total of Versed 1 mg and Fentanyl 50 mcg was administered intravenously. Moderate Sedation Time: 15 minutes. The patient's level of consciousness and vital signs were monitored continuously by radiology nursing throughout the procedure under my direct supervision. FLUOROSCOPY TIME:  Fluoroscopy Time: 0 minutes 0 seconds  (0 mGy). COMPLICATIONS: None immediate. PROCEDURE: Informed written consent was obtained from the patient after a thorough discussion of the procedural risks, benefits and alternatives. All questions were addressed. A timeout was performed prior to the initiation of the procedure. A planning axial CT scan was performed. The heterogeneous mass in the presacral space was successfully identified. A suitable skin entry site was selected and marked. The region was sterilely prepped and draped in standard fashion using chlorhexidine skin prep. Local anesthesia was attained by infiltration with 1% lidocaine. A small dermatotomy was made. Under intermittent CT guidance, an 18 gauge introducer needle was advanced into the margin of the mass. Multiple 18 gauge core biopsies were then coaxially obtained using the bio Pince automated biopsy device. Biopsy specimens were placed in formalin and delivered to pathology for further analysis. The introducer needle was removed. Post biopsy axial CT imaging demonstrates no evidence of immediate complication. The patient tolerated the procedure well. IMPRESSION: Technically successful CT-guided core biopsy of presacral mass. Electronically Signed   By: Jacqulynn Cadet M.D.   On: 05/01/2018 13:44    Time Spent in minutes  25   Torey Reinard M.D on 05/10/2018 at 12:29 PM  Between 7am to 7pm - Pager - [phone]  After 7pm go to [email or web address] - password [facility]  [facility] -  Office  [phone]

## 2018-05-11 ENCOUNTER — Inpatient Hospital Stay (HOSPITAL_COMMUNITY): Payer: BLUE CROSS/BLUE SHIELD

## 2018-05-11 ENCOUNTER — Encounter (HOSPITAL_COMMUNITY): Payer: Self-pay | Admitting: Interventional Radiology

## 2018-05-11 ENCOUNTER — Ambulatory Visit
Admit: 2018-05-11 | Discharge: 2018-05-11 | Disposition: A | Discharge status: Home / Self Care | Payer: BLUE CROSS/BLUE SHIELD | Attending: Radiation Oncology | Admitting: Radiation Oncology

## 2018-05-11 DIAGNOSIS — I480 Paroxysmal atrial fibrillation: Secondary | ICD-10-CM

## 2018-05-11 DIAGNOSIS — C569 Malignant neoplasm of unspecified ovary: Secondary | ICD-10-CM

## 2018-05-11 DIAGNOSIS — E876 Hypokalemia: Secondary | ICD-10-CM

## 2018-05-11 DIAGNOSIS — I5022 Chronic systolic (congestive) heart failure: Secondary | ICD-10-CM

## 2018-05-11 DIAGNOSIS — Z7901 Long term (current) use of anticoagulants: Secondary | ICD-10-CM

## 2018-05-11 DIAGNOSIS — N183 Chronic kidney disease, stage 3 (moderate): Secondary | ICD-10-CM

## 2018-05-11 DIAGNOSIS — G893 Neoplasm related pain (acute) (chronic): Principal | ICD-10-CM

## 2018-05-11 DIAGNOSIS — Z952 Presence of prosthetic heart valve: Secondary | ICD-10-CM

## 2018-05-11 HISTORY — PX: IR IMAGING GUIDED PORT INSERTION: IMG5740

## 2018-05-11 LAB — PROTIME-INR
INR: 1.08
PROTHROMBIN TIME: 13.9 s (ref 11.4–15.2)

## 2018-05-11 LAB — CBC WITH DIFFERENTIAL/PLATELET
Abs Immature Granulocytes: 0.06 10*3/uL (ref 0.00–0.07)
BASOS ABS: 0 10*3/uL (ref 0.0–0.1)
BASOS PCT: 0 %
EOS ABS: 0.1 10*3/uL (ref 0.0–0.5)
Eosinophils Relative: 1 %
HCT: 33.9 % — ABNORMAL LOW (ref 36.0–46.0)
Hemoglobin: 10.9 g/dL — ABNORMAL LOW (ref 12.0–15.0)
Immature Granulocytes: 1 %
Lymphocytes Relative: 13 %
Lymphs Abs: 1.4 10*3/uL (ref 0.7–4.0)
MCH: 29.6 pg (ref 26.0–34.0)
MCHC: 32.2 g/dL (ref 30.0–36.0)
MCV: 92.1 fL (ref 80.0–100.0)
MONO ABS: 1 10*3/uL (ref 0.1–1.0)
Monocytes Relative: 9 %
NRBC: 0 % (ref 0.0–0.2)
Neutro Abs: 8.2 10*3/uL — ABNORMAL HIGH (ref 1.7–7.7)
Neutrophils Relative %: 76 %
PLATELETS: 249 10*3/uL (ref 150–400)
RBC: 3.68 MIL/uL — AB (ref 3.87–5.11)
RDW: 14.6 % (ref 11.5–15.5)
WBC: 10.8 10*3/uL — AB (ref 4.0–10.5)

## 2018-05-11 LAB — BASIC METABOLIC PANEL
Anion gap: 11 (ref 5–15)
BUN: 9 mg/dL (ref 6–20)
CO2: 28 mmol/L (ref 22–32)
Calcium: 9.2 mg/dL (ref 8.9–10.3)
Chloride: 97 mmol/L — ABNORMAL LOW (ref 98–111)
Creatinine, Ser: 1.06 mg/dL — ABNORMAL HIGH (ref 0.44–1.00)
GFR calc non Af Amer: 57 mL/min — ABNORMAL LOW (ref >60)
Glucose, Bld: 99 mg/dL (ref 70–99)
Potassium: 3.8 mmol/L (ref 3.5–5.1)
SODIUM: 136 mmol/L (ref 135–145)

## 2018-05-11 MED ORDER — MIDAZOLAM HCL 2 MG/2ML IJ SOLN
INTRAMUSCULAR | Status: AC
  Filled 2018-05-11: qty 2

## 2018-05-11 MED ORDER — ENOXAPARIN SODIUM 100 MG/ML ~~LOC~~ SOLN
1.0000 mg/kg | Freq: Two times a day (BID) | SUBCUTANEOUS | Status: DC
  Administered 2018-05-11 – 2018-05-14 (×7): 90 mg via SUBCUTANEOUS
  Filled 2018-05-11 (×8): qty 1

## 2018-05-11 MED ORDER — LIDOCAINE-EPINEPHRINE (PF) 1 %-1:200000 IJ SOLN
INTRAMUSCULAR | Status: AC | PRN
  Administered 2018-05-11 (×2): 10 mL

## 2018-05-11 MED ORDER — FENTANYL CITRATE (PF) 100 MCG/2ML IJ SOLN
INTRAMUSCULAR | Status: AC | PRN
  Administered 2018-05-11 (×2): 50 ug via INTRAVENOUS

## 2018-05-11 MED ORDER — FENTANYL CITRATE (PF) 100 MCG/2ML IJ SOLN
INTRAMUSCULAR | Status: AC
  Filled 2018-05-11: qty 2

## 2018-05-11 MED ORDER — HEPARIN SOD (PORK) LOCK FLUSH 100 UNIT/ML IV SOLN
INTRAVENOUS | Status: AC
  Filled 2018-05-11: qty 5

## 2018-05-11 MED ORDER — CEFAZOLIN SODIUM-DEXTROSE 2-4 GM/100ML-% IV SOLN
INTRAVENOUS | Status: AC
  Administered 2018-05-11: 2 g via INTRAVENOUS
  Filled 2018-05-11: qty 100

## 2018-05-11 MED ORDER — LIDOCAINE-EPINEPHRINE (PF) 2 %-1:200000 IJ SOLN
INTRAMUSCULAR | Status: AC
  Filled 2018-05-11: qty 20

## 2018-05-11 MED ORDER — MIDAZOLAM HCL 2 MG/2ML IJ SOLN
INTRAMUSCULAR | Status: AC
  Filled 2018-05-11: qty 4

## 2018-05-11 MED ORDER — MIDAZOLAM HCL 2 MG/2ML IJ SOLN
INTRAMUSCULAR | Status: AC | PRN
  Administered 2018-05-11 (×3): 1 mg via INTRAVENOUS

## 2018-05-11 MED ORDER — WARFARIN SODIUM 7.5 MG PO TABS
7.5000 mg | ORAL_TABLET | Freq: Once | ORAL | Status: AC
  Administered 2018-05-11: 7.5 mg via ORAL
  Filled 2018-05-11: qty 1

## 2018-05-11 MED ORDER — WARFARIN - PHARMACIST DOSING INPATIENT: Freq: Every day | Status: DC

## 2018-05-11 NOTE — Procedures (Signed)
Pre Procedure Dx: Recurrent ovarian cancer Post Procedural Dx: Same  Successful placement of right EJ approach port-a-cath with tip at the superior caval atrial junction. The catheter is ready for immediate use.  Estimated Blood Loss: Minimal  Complications: None immediate.  Ronny Bacon, MD Pager #: [phone]

## 2018-05-11 NOTE — Progress Notes (Signed)
PROGRESS NOTE                                                                                                                                                                                                             Patient Demographics:    [name], is a 57 y.o. female, DOB - [birth year], EUM:[id number]  Admit date - 05/04/2018   Admitting Physician Rise Patience, MD  Outpatient Primary MD for the patient is Default, Provider, MD  LOS - 5  Outpatient Specialists: Dr Mosetta Anis  Chief Complaint  Patient presents with  . 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  She had a radical debulking along with rectosigmoid resection with low rectal anastomosis, right distal ureter resection, ureteroneocystostom and omentectomy in 2014 followed by excellent chemotherapy.  She had exploratory laparotomy with radical resection of recurrent tumor in 2017.  Patient now following with Dr. Marin Olp and presenting with severe persistent abdominal pain.  Patient had a recent retroperitoneal soft tissue biopsy done on 11/8 showing granulosa cell tumor and CT of the abdomen showing multiple pelvic masses and mild left hydronephrosis with suspicious new nodule in the hepatic dome. Patient admitted for adequate pain control.    Subjective:   Pain better controlled.  Waiting for Port-A-Cath placement.  Assessment  & Plan :    Principal Problem:   Intractable abdominal pain Secondary to recurrence of her granulosa cell tumor with multiple pelvic masses, mild left hydronephrosis and possible new hepatic lesion. OxyContin dose increased to 20 mg twice daily with pain better controlled.  Requiring IV Dilaudid less frequently.  Continue oral Dilaudid 2 mg every 4 hours as needed.  Pain better  controlled.       Active Problems: Recurrence of granulosa cell tumor. GYN oncology consulted, who after discussing with patient's surgeon (Dr. Aldean Ast) at [facility] recommended palliative radiation.  Simulation followed by plan on 5 weeks of radiation directed at the pelvis. Port-A-Cath placement today and will be transferred to oncology floor.  Will be taken over by her oncologist as primary from tomorrow. Coumadin and Lovenox on hold for Port-A-Cath placement.  History of chronic CHF Last echo in May 2019 with EF of 45%.  Also has history of V. tach status post ICD.  Currently euvolemic.  Continue home diuretic.  History of mitral valve repair Following MI with postinfarction papillary muscle rupture in 2011.  INR therapeutic.  Coumadin held for Port-A-Cath placement.  Mild left-sided hydronephrosis As seen on CT.  Monitor renal function.  Voiding without difficulty.  Chronic kidney disease stage III Stable at baseline  COPD Stable.  Continue home meds.  Hypokalemia Replenished  ?  Paroxysmal A. fib Patient denies history of it.  On Coumadin for mitral valve repair.        Code Status : Full code  Family Communication  : None at bedside  Disposition Plan  : Transfer to oncology floor after Port-A-Cath placement, Dr. Marin Olp to take over as primary from 11/19  Barriers For Discharge : Active symptoms  Consults  : Oncology, GYN-onc  Procedures  : CT abdomen and pelvis DVT prophylaxis: Therapeutic Lovenox  Lab Results  Component Value Date   PLT 249 05/11/2018    Antibiotics  :   Anti-infectives (From admission, onward)   Start     Dose/Rate Route Frequency Ordered Stop   05/11/18 1500  ceFAZolin (ANCEF) IVPB 2g/100 mL premix     2 g 200 mL/hr over 30 Minutes Intravenous To Radiology 05/08/18 1417 05/11/18 1110        Objective:   Vitals:   05/11/18 1106 05/11/18 1110 05/11/18 1115 05/11/18 1120  BP: 107/74 105/65 (!) 93/48 102/63  Pulse: 86 86  87 86  Resp: 16 12 15 11   Temp:      TempSrc:      SpO2: 98% 98% 96% 98%  Weight:      Height:        Wt Readings from Last 3 Encounters:  05/04/18 89.4 kg  05/01/18 89.4 kg  05/01/18 89.4 kg     Intake/Output Summary (Last 24 hours) at 05/11/2018 1121 Last data filed at 05/10/2018 1838 Gross per 24 hour  Intake 240 ml  Output -  Net 240 ml   Physical exam Not in distress HEENT: Moist mucosa, supple neck Chest: Clear CVs: Normal S1 and S2, no murmurs GI: Soft, non-tender, nondistended Musculoskeletal: Warm, no edema         Data Review:    CBC Recent Labs  Lab 05/04/18 2221 05/06/18 0539 05/09/18 0552 05/11/18 0615  WBC 14.8* 10.1 12.0* 10.8*  HGB 11.7* 11.5* 11.0* 10.9*  HCT 35.3* 36.2 34.8* 33.9*  PLT 195 194 215 249  MCV 90.3 92.8 93.3 92.1  MCH 29.9 29.5 29.5 29.6  MCHC 33.1 31.8 31.6 32.2  RDW 14.9 15.0 14.6 14.6  LYMPHSABS  --   --  1.2 1.4  MONOABS  --   --  1.2* 1.0  EOSABS  --   --  0.1 0.1  BASOSABS  --   --  0.0 0.0    Chemistries  Recent Labs  Lab 05/04/18 2245 05/05/18 1250 05/06/18 0539 05/09/18 0552 05/11/18 0615  NA 136 138 137 136 136  K 2.9* 3.1* 3.6 3.3* 3.8  CL 99 101 101 96* 97*  CO2 26 26 27 29 28   GLUCOSE 109* 93 104* 104* 99  BUN 13 10 10 9 9   CREATININE 1.03* 0.96 0.92 1.05* 1.06*  CALCIUM 8.8* 8.8* 8.8* 8.9 9.2  MG  --  1.9  --   --   --   AST  --   --   --  17  --   ALT  --   --   --  18  --   ALKPHOS  --   --   --  58  --   BILITOT  --   --   --  0.8  --    ------------------------------------------------------------------------------------------------------------------ No results for input(s): CHOL, HDL, LDLCALC, TRIG, CHOLHDL, LDLDIRECT in the last 72 hours.  Lab Results  Component Value Date   HGBA1C 5.0 09/12/2017   ------------------------------------------------------------------------------------------------------------------ No results for input(s): TSH, T4TOTAL, T3FREE, THYROIDAB in the  last 72 hours.  Invalid input(s): FREET3 ------------------------------------------------------------------------------------------------------------------ No results for input(s): VITAMINB12, FOLATE, FERRITIN, TIBC, IRON, RETICCTPCT in the last 72 hours.  Coagulation profile Recent Labs  Lab 05/07/18 0543 05/08/18 0611 05/09/18 0552 05/10/18 0542 05/11/18 0615  INR 2.29 2.39 1.22 1.10 1.08    No results for input(s): DDIMER in the last 72 hours.  Cardiac Enzymes No results for input(s): CKMB, TROPONINI, MYOGLOBIN in the last 168 hours.  Invalid input(s): CK ------------------------------------------------------------------------------------------------------------------    Component Value Date/Time   BNP 327.3 (H) 09/12/2017 1610    Inpatient Medications  Scheduled Meds: . aspirin EC  81 mg Oral Daily  . carvedilol  3.125 mg Oral BID WC  . furosemide  20 mg Oral Daily  . gabapentin  300 mg Oral TID  . lidocaine-EPINEPHrine      . oxyCODONE  20 mg Oral Q12H  . polyethylene glycol  17 g Oral BID  . spironolactone  25 mg Oral Daily  . zolpidem  5 mg Oral QHS   Continuous Infusions:  PRN Meds:.acetaminophen, albuterol, alum & mag hydroxide-simeth, fentaNYL, HYDROmorphone (DILAUDID) injection, HYDROmorphone, lidocaine, lidocaine-EPINEPHrine, LORazepam, methocarbamol, midazolam, phenylephrine-shark liver oil-mineral oil-petrolatum  Micro Results No results found for this or any previous visit (from the past 240 hour(s)).  Radiology Reports Ct Abdomen Pelvis W Contrast  Result Date: 05/05/2018 CLINICAL DATA:  Masses in the pelvis.  Pain management. EXAM: CT ABDOMEN AND PELVIS WITH CONTRAST TECHNIQUE: Multidetector CT imaging of the abdomen and pelvis was performed using the standard protocol following bolus administration of intravenous contrast. CONTRAST:  137mL ISOVUE-300 IOPAMIDOL (ISOVUE-300) INJECTION 61% COMPARISON:  May 01, 2018 FINDINGS: Lower chest: Left  effusion and adjacent platelike scar atelectasis is stable. AICD leads terminate in the right ventricle. Cardiomegaly again identified. Small hiatal hernia. No other changes in the lower chest. Hepatobiliary: Suspicion of a tiny nodule in the hepatic dome on series 2, image 14 not seen previously, measuring 5.7 mm. No other liver nodules or masses. Cholelithiasis. The portal vein is patent. Pancreas: Unremarkable. No pancreatic ductal dilatation or surrounding inflammatory changes. Spleen: Normal in size without focal abnormality. Adrenals/Urinary Tract: Adrenal glands are normal. Mild left hydronephrosis and mild left ureterectasis are more prominent in the interval. No hydronephrosis on the right. The bladder is unchanged. Stomach/Bowel: The stomach and small bowel are normal. The sigmoid colon cannot be separated from pelvic masses, similar in the interval. Postoperative changes seen in the sigmoid colon. No colonic obstruction. The remainder of the colon is otherwise unremarkable. The appendix is not well seen consistent with history of previous appendectomy. Vascular/Lymphatic: Atherosclerotic changes are seen in the nonaneurysmal aorta. Reproductive: The patient is status post hysterectomy. Other: The right pericolic nodule measures 15 by 11 mm today versus 14 x 10 mm previously. Multiple pelvic masses are again identified. The mass inferior to the bifurcation measures 4.4 by 2.6 cm today versus 4.7 x 2.6 cm previously. The right adnexal low-density lesion measures 3.8 by 1.9 cm today versus 3.6 x 2.4 cm previously. The mass posterior to the bladder  measures 5.5 x 4.5 cm today versus 5.4 x 4.4 cm previously. The presacral mass measures 6.8 by 6.3 cm today versus 6.7 x 6.4 cm previously. Adjacent pelvic nodes are similar in the interval. Musculoskeletal: Previous right hip replacement. Degenerative changes in the left hip. No bony metastatic disease. IMPRESSION: 1. The right para colic nodule/mass and the  multiple pelvic masses are similar in the interval. Slight differences in measurement could be due to difference in slice selection. Recommend continued attention on follow-up. 2. Mild left hydronephrosis, new in the interval, likely due to compression of the distal left ureter by 1 or more of the pelvic masses. No obstruction on the right. 3. Suspicion of a new nodule in the hepatic dome as above. Recommend attention on follow-up. Alternatively, an MRI could further assess if clinically warranted. 4. Atherosclerotic changes in the nonaneurysmal aorta. 5. Stable left effusion and platelike scarring/atelectasis. Electronically Signed   By: Dorise Bullion III M.D   On: 05/05/2018 00:08   Ct Abdomen Pelvis W Contrast  Result Date: 05/01/2018 CLINICAL DATA:  Pain in the region of tailbone biopsy EXAM: CT ABDOMEN AND PELVIS WITH CONTRAST TECHNIQUE: Multidetector CT imaging of the abdomen and pelvis was performed using the standard protocol following bolus administration of intravenous contrast. CONTRAST:  115mL OMNIPAQUE IOHEXOL 300 MG/ML  SOLN COMPARISON:  05/01/2018, 04/08/2018, 08/26/2017, 06/03/2017 CT FINDINGS: Lower chest: Lung bases again demonstrate small left pleural effusion with platelike consolidation in the posterior peripheral left lung base, similar as compared with 04/08/2018. Partially visualized cardiac pacing leads. Borderline cardiomegaly. Hepatobiliary: No focal liver abnormality is seen. No gallstones, gallbladder wall thickening, or biliary dilatation. Pancreas: Unremarkable. No pancreatic ductal dilatation or surrounding inflammatory changes. Spleen: Normal in size without focal abnormality. Adrenals/Urinary Tract: Adrenal glands are unremarkable. Kidneys are normal, without renal calculi, focal lesion, or hydronephrosis. Mass-effect on the posterior bladder by pelvic mass. Stomach/Bowel: Stomach is nonenlarged. No dilated small bowel. No colon wall thickening. Postsurgical changes of the  rectosigmoid colon. Sigmoid colon difficult to separate from central pelvic mass. Vascular/Lymphatic: Moderate aortic atherosclerosis. No aneurysmal dilatation. Reproductive: History of hysterectomy and oophorectomy on epic. Other: Right paracolic gutter lesion measures 14 x 10 mm compared with 14 x 17 mm previously. Multiple pelvic masses are again identified. Mass inferior to the iliac bifurcation measures 47 x 26 mm compared with 51 x 24 mm previously. Right adnexal low-density lesion measures 36 x 24 mm compared with 34 x 37 mm previously. Central low pelvic mass posterior to the bladder measures 44 x 54 mm, previously 54 x 45 mm. Increased size of presacral lesion, this measures 64 x 67 mm, compared with 51 x 37 mm. Surrounding hazy attenuation, may be secondary to post biopsy change or inflammation of the mass. There is mass effect on the adjacent rectum. Size of this mass does not appear significantly changed when compared to the biopsy images performed today. Multiple additional presacral masses are noted. There are multiple enhancing nodules in the right pelvic fat. There is edema in the perirectal space. Musculoskeletal: No acute or suspicious osseous abnormality. Prior right hip replacement. IMPRESSION: 1. Multiple known pelvic masses. Significant increase in size of the biopsied presacral lesion as compared with 04/08/2018. There is soft tissue stranding surrounding the mass which may be secondary to post biopsy change or inflammation of the mass. This mass exerts mass effect on the rectum. There is perirectal edema. There are multiple additional pelvic masses which are grossly stable in size since the comparison CT.  The mass situated posterior to the bladder is difficult to separate from adjacent sigmoid colon and question invasion. 2. Similar appearance of small left pleural effusion with platelike consolidation in the peripheral left lung base. Electronically Signed   By: Donavan Foil M.D.   On:  05/01/2018 18:58   Ct Biopsy  Result Date: 05/01/2018 INDICATION: 57 year old female with a history of granulosa cell tumor of the ovary, unspecified laterality. She has demonstrated evidence of progressive disease on her most recent CT imaging and presents for CT-guided biopsy of the same. Of note, she has had progressive severe pelvic pain over the past 5 days which is unrelieved by Percocet. She intends on going to the emergency room following her procedure and recovery today for further evaluation and management. I have contacted her oncologist, Dr. Marin Olp, and he is aware and agrees with her visiting the emergency room. The patient still desires to proceed with the biopsy in order to get tissue for molecular studies. EXAM: CT BIOPSY MEDICATIONS: None. ANESTHESIA/SEDATION: Moderate (conscious) sedation was employed during this procedure. A total of Versed 1 mg and Fentanyl 50 mcg was administered intravenously. Moderate Sedation Time: 15 minutes. The patient's level of consciousness and vital signs were monitored continuously by radiology nursing throughout the procedure under my direct supervision. FLUOROSCOPY TIME:  Fluoroscopy Time: 0 minutes 0 seconds (0 mGy). COMPLICATIONS: None immediate. PROCEDURE: Informed written consent was obtained from the patient after a thorough discussion of the procedural risks, benefits and alternatives. All questions were addressed. A timeout was performed prior to the initiation of the procedure. A planning axial CT scan was performed. The heterogeneous mass in the presacral space was successfully identified. A suitable skin entry site was selected and marked. The region was sterilely prepped and draped in standard fashion using chlorhexidine skin prep. Local anesthesia was attained by infiltration with 1% lidocaine. A small dermatotomy was made. Under intermittent CT guidance, an 18 gauge introducer needle was advanced into the margin of the mass. Multiple 18 gauge core  biopsies were then coaxially obtained using the bio Pince automated biopsy device. Biopsy specimens were placed in formalin and delivered to pathology for further analysis. The introducer needle was removed. Post biopsy axial CT imaging demonstrates no evidence of immediate complication. The patient tolerated the procedure well. IMPRESSION: Technically successful CT-guided core biopsy of presacral mass. Electronically Signed   By: Jacqulynn Cadet M.D.   On: 05/01/2018 13:44    Time Spent in minutes  25   Greydis Stlouis M.D on 05/11/2018 at 11:21 AM  Between 7am to 7pm - Pager - [phone]  After 7pm go to [email or web address] - password [facility]  [facility] -  Office  [phone]

## 2018-05-11 NOTE — Progress Notes (Signed)
[place] for LMWH/warfarin  Indication: MVR    Allergies  Allergen Reactions  . Prochlorperazine Edisylate Other (See Comments)    Nervous/ flutter/ shakes--compazine  . Adhesive [Tape] Other (See Comments)    Redness/skin peeling Redness/hives from adhesive tape( tolerates latex gloves); tolerates paper tape  . Prochlorperazine Other (See Comments)    Jitters, hyper    Patient Measurements: Height: 5\' 6"  (167.6 cm) Weight: 197 lb (89.4 kg) IBW/kg (Calculated) : 59.3 Heparin Dosing Weight: 78.7 kg  Vital Signs: Temp: 98.5 F (36.9 C) (11/18 1325) Temp Source: Oral (11/18 1325) BP: 108/69 (11/18 1325) Pulse Rate: 89 (11/18 1325)  Labs: Recent Labs    05/09/18 0552 05/10/18 0542 05/11/18 0615  HGB 11.0*  --  10.9*  HCT 34.8*  --  33.9*  PLT 215  --  249  LABPROT 15.3* 14.1 13.9  INR 1.22 1.10 1.08  CREATININE 1.05*  --  1.06*    Estimated Creatinine Clearance: 65.1 mL/min (A) (by C-G formula based on SCr of 1.06 mg/dL (H)).   Assessment: 57 yo F on coumadin/LMWH bridge PTA for transcatheter mitral valve in valve replacement and PAF.   Home dose is 5 mg qd X 7.5 MWF, on LMWH 80 q12 ended 11/11. Last dose warf 11/11 at 1800 INR 1.61 on admissionwhich is below coumadin clinic goal of 2-3.  Hg 11.7, PLTC 195. Wt 78.7 11/11 > 89.4 11/12.   - Coumadin was reversed in preparation for PAC. Pt was bridged with enoxaparin. Per MD, ok to resume warfarin. Keep enoxaparin for now for bridging until INR is therapeutic.   MEDICATION-RELATED CONSULT NOTE   IR Procedure Consult - Anticoagulant/Antiplatelet PTA/Inpatient Med List Review by Pharmacist    Procedure:  EJ approach port-a-cath with tip at the superior caval atrial junction.    Completed: 05/11/17 at 11:33  Post-Procedural bleeding risk per IR MD assessment:  Standard   Antithrombotic medications on inpatient or PTA profile prior to procedure:      Enoxaparin 1 mg/kg  q12h Warfarin per pharmacy   Recommended restart time per IR Post-Procedure Guidelines:    Enoxaparin 6 hours after end of procedure Warfarin ok to start on Day 0   Other considerations:      Plan:     - Resume enoxaparin and warfarin this evening      Today, 05/11/18  PAC placed today, see above   INR today is 1.08, subtherapeutic  Hgb, plt stable    Goal of Therapy:  INR 2-3 Monitor platelets by anticoagulation protocol: Yes   Plan:   Warfarin 7.5 mg PO x1   Daily Pt/INR  Monitor for signs and symptoms of bleeding   Royetta Asal, PharmD, BCPS Pager [phone] 05/11/2018 2:07 PM

## 2018-05-11 NOTE — Progress Notes (Signed)
[name] is feeling a little bit better.  I think the pain protocol that we have her on seems to be helping.  She is going for her Port-A-Cath today.  She started radiation therapy.  She started this last Thursday or Friday.  She will continue this today.  I will start her chemotherapy tomorrow.  I think this will be very helpful for her and will improve palliation of her pain and help further decrease her tumor burden.  I  will be more than happy to take over as attending now.  I appreciate the help from the hospitalist.  If the hospitalist can move her up to the sixth floor for her chemotherapy I will then be more than happy to take over as attending.  I notified the nurses up on 6 E. that she will start her chemotherapy tomorrow.  She is eating a little bit better.  She is going to the bathroom okay.  Her labs look all right.  Her white cell count is 10.8.  Hemoglobin 10.9.  Platelet count 249,000.  Her exam is unchanged.  Her temperature is 98.4.  Pulse 85.  Blood pressure 93/73.  Her abdomen is still slightly distended.  Bowel sounds are active.  There is no fluid wave.  There is no obvious abdominal mass.  Lungs are clear.  Cardiac exam regular rate and rhythm.  Extremities shows no clubbing, cyanosis or edema.  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  She is started radiation therapy.  I will add low-dose chemotherapy to the radiation in order to improve responses.  We are looking for a quick response that will be significant so that she will have decreasing her pain and become much more functional.   Lattie Haw, MD  Romans 8:28

## 2018-05-11 NOTE — Progress Notes (Signed)
Patient alert, oriented, laying in bed.  States her pain is slightly better but it is based on when she has her medications.  She reports relief when taking the robaxin and dilaudid together. She is to have port a cath today then transfer to 6th floor for chemotherapy per Dr. Marin Olp tomorrow.  No needs voiced at this time.  GYN ONC will continue to follow.

## 2018-05-12 ENCOUNTER — Ambulatory Visit
Admit: 2018-05-12 | Discharge: 2018-05-12 | Disposition: A | Discharge status: Home / Self Care | Payer: BLUE CROSS/BLUE SHIELD | Attending: Radiation Oncology | Admitting: Radiation Oncology

## 2018-05-12 LAB — PROTIME-INR
INR: 1.04
Prothrombin Time: 13.5 seconds (ref 11.4–15.2)

## 2018-05-12 LAB — CBC WITH DIFFERENTIAL/PLATELET
Abs Immature Granulocytes: 0.06 10*3/uL (ref 0.00–0.07)
Basophils Absolute: 0 10*3/uL (ref 0.0–0.1)
Basophils Relative: 0 %
EOS ABS: 0.1 10*3/uL (ref 0.0–0.5)
EOS PCT: 1 %
HEMATOCRIT: 31.6 % — AB (ref 36.0–46.0)
HEMOGLOBIN: 10.2 g/dL — AB (ref 12.0–15.0)
Immature Granulocytes: 1 %
LYMPHS PCT: 14 %
Lymphs Abs: 1.4 10*3/uL (ref 0.7–4.0)
MCH: 30.5 pg (ref 26.0–34.0)
MCHC: 32.3 g/dL (ref 30.0–36.0)
MCV: 94.6 fL (ref 80.0–100.0)
MONO ABS: 0.9 10*3/uL (ref 0.1–1.0)
Monocytes Relative: 9 %
Neutro Abs: 7.4 10*3/uL (ref 1.7–7.7)
Neutrophils Relative %: 75 %
Platelets: 225 10*3/uL (ref 150–400)
RBC: 3.34 MIL/uL — ABNORMAL LOW (ref 3.87–5.11)
RDW: 14.7 % (ref 11.5–15.5)
WBC: 9.9 10*3/uL (ref 4.0–10.5)
nRBC: 0 % (ref 0.0–0.2)

## 2018-05-12 LAB — COMPREHENSIVE METABOLIC PANEL
ALBUMIN: 3.2 g/dL — AB (ref 3.5–5.0)
ALK PHOS: 56 U/L (ref 38–126)
ALT: 23 U/L (ref 0–44)
AST: 20 U/L (ref 15–41)
Anion gap: 9 (ref 5–15)
BILIRUBIN TOTAL: 0.7 mg/dL (ref 0.3–1.2)
BUN: 10 mg/dL (ref 6–20)
CO2: 29 mmol/L (ref 22–32)
Calcium: 8.8 mg/dL — ABNORMAL LOW (ref 8.9–10.3)
Chloride: 96 mmol/L — ABNORMAL LOW (ref 98–111)
Creatinine, Ser: 1.08 mg/dL — ABNORMAL HIGH (ref 0.44–1.00)
GFR calc Af Amer: >60 mL/min (ref >60)
GFR calc non Af Amer: 55 mL/min — ABNORMAL LOW (ref >60)
GLUCOSE: 108 mg/dL — AB (ref 70–99)
POTASSIUM: 3.7 mmol/L (ref 3.5–5.1)
Sodium: 134 mmol/L — ABNORMAL LOW (ref 135–145)
TOTAL PROTEIN: 6.5 g/dL (ref 6.5–8.1)

## 2018-05-12 MED ORDER — POTASSIUM CHLORIDE 2 MEQ/ML IV SOLN
Freq: Once | INTRAVENOUS | Status: AC
  Administered 2018-05-12: 14:00:00 via INTRAVENOUS
  Filled 2018-05-12: qty 10

## 2018-05-12 MED ORDER — SODIUM CHLORIDE 0.9 % IV SOLN
Freq: Once | INTRAVENOUS | Status: AC
  Administered 2018-05-12: 14:00:00 via INTRAVENOUS

## 2018-05-12 MED ORDER — SODIUM CHLORIDE 0.9 % IV SOLN
Freq: Once | INTRAVENOUS | Status: AC
  Administered 2018-05-12: 14:00:00 via INTRAVENOUS
  Filled 2018-05-12: qty 5

## 2018-05-12 MED ORDER — PALONOSETRON HCL INJECTION 0.25 MG/5ML
0.2500 mg | Freq: Once | INTRAVENOUS | Status: AC
  Administered 2018-05-12: 0.25 mg via INTRAVENOUS
  Filled 2018-05-12: qty 5

## 2018-05-12 MED ORDER — WARFARIN SODIUM 5 MG PO TABS
7.5000 mg | ORAL_TABLET | Freq: Once | ORAL | Status: AC
  Administered 2018-05-12: 7.5 mg via ORAL
  Filled 2018-05-12: qty 1

## 2018-05-12 MED ORDER — SODIUM CHLORIDE 0.9 % IV SOLN
90.0000 mg/m2 | Freq: Once | INTRAVENOUS | Status: AC
  Administered 2018-05-12: 180 mg via INTRAVENOUS
  Filled 2018-05-12: qty 9

## 2018-05-12 MED ORDER — SODIUM CHLORIDE 0.9 % IV SOLN
48.0000 mg/m2 | Freq: Once | INTRAVENOUS | Status: AC
  Administered 2018-05-12: 98 mg via INTRAVENOUS
  Filled 2018-05-12: qty 98

## 2018-05-12 NOTE — Progress Notes (Signed)
[name] is now up on 6 E.  She had a Port-A-Cath placed yesterday.  I very much appreciate pharmacy's help in coordinating her Lovenox and Coumadin.  She has not restarted the Coumadin.  Her pain continues to improve slowly.  She is more comfortable.  She still requires some IV pain medication.  However, this is less.  She will start her chemotherapy today.  We need to get labs on her today.  We will see what the labs look like.  Her appetite is better.  She feels as if she can eat a little bit more now that her pain is less.  She is had no bleeding.  There is been no cough.  She is had no problems with bowels or bladder.  She is had no leg swelling.  She has had no rashes.  Her vital signs are all stable.  She is afebrile.  Pulse is 101.  Blood pressure 119/73.  Her lungs are clear.  Cardiac exam regular rate and rhythm.  Abdomen is soft.  Bowel sounds are present.  Extremities shows no clubbing, cyanosis or edema.  [name] will start chemotherapy with radiation therapy.  She will have low-dose cis-platinum/etoposide.  Again, this will be to improve the effectiveness of radiation.  I think this will be very helpful given that she has a granulosa cell tumor.  We will see what her labs are.  I am also happy that her pain is doing better.  She still has improvement with her pain control but at least she is to the point that she can sit up and ambulate a little bit.  She clearly has come along way since admission.  She has had fantastic care from all the staff while she was on 5 E.  I am sure that she will get wonderful care from the staff up on 6 E.  Lattie Haw, MD  Oswaldo Milian 41:10

## 2018-05-12 NOTE — Progress Notes (Addendum)
[name] for LMWH/warfarin  Indication: MVR, atrial fibrillation    Allergies  Allergen Reactions  . Prochlorperazine Edisylate Other (See Comments)    Nervous/ flutter/ shakes--compazine  . Adhesive [Tape] Other (See Comments)    Redness/skin peeling Redness/hives from adhesive tape( tolerates latex gloves); tolerates paper tape  . Prochlorperazine Other (See Comments)    Jitters, hyper    Patient Measurements: Height: 5\' 6"  (167.6 cm) Weight: 197 lb (89.4 kg) IBW/kg (Calculated) : 59.3  Vital Signs: Temp: 97.5 F (36.4 C) (11/19 0553) Temp Source: Oral (11/19 0553) BP: 119/73 (11/19 0553) Pulse Rate: 101 (11/19 0553)  Labs: Recent Labs    05/10/18 0542 05/11/18 0615 05/12/18 0500 05/12/18 0625  HGB  --  10.9*  --  10.2*  HCT  --  33.9*  --  31.6*  PLT  --  249  --  225  LABPROT 14.1 13.9 13.5  --   INR 1.10 1.08 1.04  --   CREATININE  --  1.06*  --  1.08*    Estimated Creatinine Clearance: 63.9 mL/min (A) (by C-G formula based on SCr of 1.08 mg/dL (H)).   Assessment: 57 yo F on warfarin/LMWH PTA (resumed after biopsy 05/01/18) for history of transcatheter mitral valve replacement and PAF. Home dose reported as warfarin 7.5mg  on M/W/F and 5mg  on all other days of the week, as well as LMWH 80mg  SQ q12h. Last doses of both PTA on 05/04/18. INR 1.61 on admission, which is below coumadin clinic goal of 2-3. Anticoagulation resumed on admission.   Significant events: 11/15: INR was reversed in preparation for PAC. 11/18: PAC placed. Warfarin resumed. Continue enoxaparin for now for bridging until INR is therapeutic per MD.   Today, 05/12/18  INR 1.04 today, subtherapeutic as expected (warfarin resumed 11/18)  Hgb, Pltc stable  No bleeding issues reported per nursing  Patient to start chemotherapy today   Both cisplatin and etoposide can increase INR; will monitor INR very closely while inpatient   Goal of Therapy:  INR  2-3 Monitor platelets by anticoagulation protocol: Yes   Plan:   Discussed plan with Dr. Marin Olp.  Repeat warfarin 7.5 mg PO x 1  Continue enoxaparin 1mg /kg SQ q12h until INR therapeutic   Daily PT/INR  Monitor CBC and for s/sx of bleeding   Lindell Spar, PharmD, BCPS Pager: [phone] 05/12/2018 11:10 AM

## 2018-05-12 NOTE — Progress Notes (Signed)
Reviewed chemotherapy information with patient and her mother using teach back method.  Provided handouts for Cisplatin and Etoposide.  Also provided education notebook with reference materials.  All questions answered.  Consent form signed and placed in shadow chart

## 2018-05-12 NOTE — Progress Notes (Signed)
Patient appearing uncomfortable in bed.  States she needs her pain medication but she thinks it may not be time.  Had her port placed yesterday and states it went well.  Plan to start chemotherapy today.  No concerns voiced.  RN notified about pt needing pain medication now.

## 2018-05-13 ENCOUNTER — Ambulatory Visit
Admit: 2018-05-13 | Discharge: 2018-05-13 | Disposition: A | Discharge status: Home / Self Care | Payer: BLUE CROSS/BLUE SHIELD | Attending: Radiation Oncology | Admitting: Radiation Oncology

## 2018-05-13 ENCOUNTER — Encounter: Payer: Self-pay | Admitting: Oncology

## 2018-05-13 LAB — CBC WITH DIFFERENTIAL/PLATELET
ABS IMMATURE GRANULOCYTES: 0.06 10*3/uL (ref 0.00–0.07)
BASOS PCT: 0 %
Basophils Absolute: 0 10*3/uL (ref 0.0–0.1)
EOS PCT: 0 %
Eosinophils Absolute: 0 10*3/uL (ref 0.0–0.5)
HCT: 28.6 % — ABNORMAL LOW (ref 36.0–46.0)
Hemoglobin: 9.1 g/dL — ABNORMAL LOW (ref 12.0–15.0)
IMMATURE GRANULOCYTES: 1 %
Lymphocytes Relative: 3 %
Lymphs Abs: 0.4 10*3/uL — ABNORMAL LOW (ref 0.7–4.0)
MCH: 29.8 pg (ref 26.0–34.0)
MCHC: 31.8 g/dL (ref 30.0–36.0)
MCV: 93.8 fL (ref 80.0–100.0)
MONO ABS: 0.2 10*3/uL (ref 0.1–1.0)
MONOS PCT: 2 %
NEUTROS ABS: 11.7 10*3/uL — AB (ref 1.7–7.7)
Neutrophils Relative %: 94 %
PLATELETS: 223 10*3/uL (ref 150–400)
RBC: 3.05 MIL/uL — AB (ref 3.87–5.11)
RDW: 14.6 % (ref 11.5–15.5)
WBC: 12.3 10*3/uL — AB (ref 4.0–10.5)
nRBC: 0 % (ref 0.0–0.2)

## 2018-05-13 LAB — COMPREHENSIVE METABOLIC PANEL
ALK PHOS: 50 U/L (ref 38–126)
ALT: 24 U/L (ref 0–44)
ANION GAP: 7 (ref 5–15)
AST: 22 U/L (ref 15–41)
Albumin: 3 g/dL — ABNORMAL LOW (ref 3.5–5.0)
BILIRUBIN TOTAL: 0.3 mg/dL (ref 0.3–1.2)
BUN: 14 mg/dL (ref 6–20)
CALCIUM: 8.8 mg/dL — AB (ref 8.9–10.3)
CO2: 26 mmol/L (ref 22–32)
Chloride: 100 mmol/L (ref 98–111)
Creatinine, Ser: 1.04 mg/dL — ABNORMAL HIGH (ref 0.44–1.00)
GFR calc Af Amer: >60 mL/min (ref >60)
GFR calc non Af Amer: 58 mL/min — ABNORMAL LOW (ref >60)
GLUCOSE: 135 mg/dL — AB (ref 70–99)
Potassium: 4.7 mmol/L (ref 3.5–5.1)
Sodium: 133 mmol/L — ABNORMAL LOW (ref 135–145)
TOTAL PROTEIN: 6 g/dL — AB (ref 6.5–8.1)

## 2018-05-13 LAB — PROTIME-INR
INR: 1.14
Prothrombin Time: 14.5 seconds (ref 11.4–15.2)

## 2018-05-13 LAB — PREPARE RBC (CROSSMATCH)

## 2018-05-13 MED ORDER — SODIUM CHLORIDE 0.9% IV SOLUTION: Freq: Once | INTRAVENOUS | Status: DC

## 2018-05-13 MED ORDER — SODIUM CHLORIDE 0.9 % IV SOLN
90.0000 mg/m2 | Freq: Once | INTRAVENOUS | Status: AC
  Administered 2018-05-13: 180 mg via INTRAVENOUS
  Filled 2018-05-13: qty 9

## 2018-05-13 MED ORDER — FUROSEMIDE 10 MG/ML IJ SOLN
20.0000 mg | Freq: Once | INTRAMUSCULAR | Status: AC
  Administered 2018-05-13: 20 mg via INTRAVENOUS
  Filled 2018-05-13: qty 2

## 2018-05-13 MED ORDER — FUROSEMIDE 10 MG/ML IJ SOLN
20.0000 mg | Freq: Once | INTRAMUSCULAR | Status: AC
  Administered 2018-05-14: 20 mg via INTRAVENOUS
  Filled 2018-05-13: qty 2

## 2018-05-13 MED ORDER — SODIUM CHLORIDE 0.9 % IV SOLN
10.0000 mg | Freq: Once | INTRAVENOUS | Status: AC
  Administered 2018-05-13: 10 mg via INTRAVENOUS
  Filled 2018-05-13: qty 1

## 2018-05-13 MED ORDER — WARFARIN SODIUM 5 MG PO TABS
7.5000 mg | ORAL_TABLET | Freq: Once | ORAL | Status: AC
  Administered 2018-05-13: 7.5 mg via ORAL
  Filled 2018-05-13: qty 1

## 2018-05-13 NOTE — Progress Notes (Signed)
Met with patient on [address].  She reports that her pain is better today.  She had some nausea this morning which is better now.  She was able to eat breakfast.  Advised her to call with any questions or needs.

## 2018-05-13 NOTE — Progress Notes (Signed)
[place] for LMWH/warfarin  Indication: MVR, atrial fibrillation    Allergies  Allergen Reactions  . Prochlorperazine Edisylate Other (See Comments)    Nervous/ flutter/ shakes--compazine  . Adhesive [Tape] Other (See Comments)    Redness/skin peeling Redness/hives from adhesive tape( tolerates latex gloves); tolerates paper tape  . Prochlorperazine Other (See Comments)    Jitters, hyper    Patient Measurements: Height: 5\' 6"  (167.6 cm) Weight: 197 lb (89.4 kg) IBW/kg (Calculated) : 59.3  Vital Signs: Temp: 98.2 F (36.8 C) (11/20 1300) Temp Source: Oral (11/20 1300) BP: 98/54 (11/20 1300) Pulse Rate: 73 (11/20 1300)  Labs: Recent Labs    05/11/18 0615 05/12/18 0500 05/12/18 0625 05/13/18 0451  HGB 10.9*  --  10.2* 9.1*  HCT 33.9*  --  31.6* 28.6*  PLT 249  --  225 223  LABPROT 13.9 13.5  --  14.5  INR 1.08 1.04  --  1.14  CREATININE 1.06*  --  1.08* 1.04*    Estimated Creatinine Clearance: 66.4 mL/min (A) (by C-G formula based on SCr of 1.04 mg/dL (H)).   Assessment: 57 yo F on warfarin/LMWH PTA (resumed after biopsy 05/01/18) for history of transcatheter mitral valve replacement and PAF. Home dose reported as warfarin 7.5mg  on M/W/F and 5mg  on all other days of the week, as well as LMWH 80mg  SQ q12h. Last doses of both PTA on 05/04/18. INR 1.61 on admission, which is below coumadin clinic goal of 2-3. Anticoagulation resumed on admission.   Significant events: 11/15: INR was reversed in preparation for PAC. 11/18: PAC placed. Warfarin resumed. Continue enoxaparin for now for bridging until INR is therapeutic per MD.   Today, 05/13/18  INR increased to 1.14 today, remains subtherapeutic  Hgb decreased 10.9 > 10.2 > 9.1, Pltc stable  Transfusing 2 units PRBCs today   No bleeding issues reported per nursing  SCr 1.04 with CrCl ~ 66 ml/min   Day 2 of chemotherapy, to receive etoposide today    Both cisplatin and  etoposide can increase INR; will monitor INR very closely while inpatient   Also on concurrent EC aspirin 81mg  daily   Goal of Therapy:  INR 2-3 Monitor platelets by anticoagulation protocol: Yes   Plan:   Warfarin 7.5 mg PO x 1  Continue enoxaparin 1mg /kg SQ q12h until INR therapeutic   Daily PT/INR  Monitor CBC and for s/sx of bleeding   Lindell Spar, PharmD, BCPS Pager: [phone] 05/13/2018 3:30 PM

## 2018-05-13 NOTE — Progress Notes (Signed)
So far, [name] is doing pretty well.  She had a first day of chemotherapy yesterday.  She had no problems with this.  Her hemoglobin is trending downward.  I really think that she is going to need to be transfused.  I do still think that she is really able to make blood all that well right now given that she has radiation and chemotherapy.  I fear that her hemoglobin will continue to decline and if so, this will cause problems for her.  I talked to her about a transfusion.  I explained how the transfusion works.  I explained to her that the transfusions are safe now.  The risk of hepatitis and HIV are virtually none.  She is agreeable to the transfusion.  Her pain is getting better.  I think the long-acting pain medication is helping.  Hopefully, this will continue to improve.  She is eating a little bit better.  She is out of bed.  She is not had any issues with nausea or vomiting.  She is going to the bathroom okay.  There is no diarrhea.  She has had no constipation.  Her vital signs look pretty stable.  Her temperature is 97.9.  Pulse 78.  Blood pressure 101/50.  Her oral exam shows no mucositis.  There is no adenopathy in the neck.  Lungs are clear bilaterally.  Cardiac exam regular rate and rhythm with no murmurs.  Abdomen is soft.  She has good bowel sounds.  There is some slight distention.  There is no fluid wave.  Extremities shows no clubbing, cyanosis or edema.  Neurological exam is nonfocal.  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  She is getting combined modality therapy for her recurrence.  She started cisplatin/etoposide yesterday.  The radiation is going well for her.  We will transfuse her today and.  We will see how this does for her.  Again I think this will benefit her greatly.  I will hopefully plan to let her go home on Friday after treatment is completed with chemotherapy.  I think she would be ready to go by then.  I very much appreciate the great  care that she is getting from all the staff up on [address].  Lattie Haw, MD  Philippians 4:4

## 2018-05-14 ENCOUNTER — Encounter (HOSPITAL_COMMUNITY): Payer: Self-pay

## 2018-05-14 ENCOUNTER — Telehealth: Payer: Self-pay

## 2018-05-14 ENCOUNTER — Ambulatory Visit
Admit: 2018-05-14 | Discharge: 2018-05-14 | Disposition: A | Discharge status: Home / Self Care | Payer: BLUE CROSS/BLUE SHIELD | Attending: Radiation Oncology | Admitting: Radiation Oncology

## 2018-05-14 LAB — COMPREHENSIVE METABOLIC PANEL
ALBUMIN: 3.2 g/dL — AB (ref 3.5–5.0)
ALT: 23 U/L (ref 0–44)
AST: 21 U/L (ref 15–41)
Alkaline Phosphatase: 53 U/L (ref 38–126)
Anion gap: 9 (ref 5–15)
BUN: 24 mg/dL — AB (ref 6–20)
CHLORIDE: 97 mmol/L — AB (ref 98–111)
CO2: 27 mmol/L (ref 22–32)
Calcium: 8.7 mg/dL — ABNORMAL LOW (ref 8.9–10.3)
Creatinine, Ser: 1.5 mg/dL — ABNORMAL HIGH (ref 0.44–1.00)
GFR calc Af Amer: 43 mL/min — ABNORMAL LOW (ref >60)
GFR, EST NON AFRICAN AMERICAN: 37 mL/min — AB (ref >60)
GLUCOSE: 120 mg/dL — AB (ref 70–99)
POTASSIUM: 4.6 mmol/L (ref 3.5–5.1)
SODIUM: 133 mmol/L — AB (ref 135–145)
Total Bilirubin: 0.7 mg/dL (ref 0.3–1.2)
Total Protein: 6.4 g/dL — ABNORMAL LOW (ref 6.5–8.1)

## 2018-05-14 LAB — PROTIME-INR
INR: 1.26
PROTHROMBIN TIME: 15.7 s — AB (ref 11.4–15.2)

## 2018-05-14 LAB — BPAM RBC
BLOOD PRODUCT EXPIRATION DATE: 201912122359
Blood Product Expiration Date: 201912102359
ISSUE DATE / TIME: 201911201942
ISSUE DATE / TIME: 201911202324
UNIT TYPE AND RH: 6200
Unit Type and Rh: 6200

## 2018-05-14 LAB — CBC WITH DIFFERENTIAL/PLATELET
Abs Immature Granulocytes: 0.33 10*3/uL — ABNORMAL HIGH (ref 0.00–0.07)
BASOS ABS: 0.1 10*3/uL (ref 0.0–0.1)
BASOS PCT: 0 %
EOS ABS: 0 10*3/uL (ref 0.0–0.5)
Eosinophils Relative: 0 %
HCT: 33.5 % — ABNORMAL LOW (ref 36.0–46.0)
Hemoglobin: 10.9 g/dL — ABNORMAL LOW (ref 12.0–15.0)
IMMATURE GRANULOCYTES: 2 %
Lymphocytes Relative: 2 %
Lymphs Abs: 0.4 10*3/uL — ABNORMAL LOW (ref 0.7–4.0)
MCH: 30.4 pg (ref 26.0–34.0)
MCHC: 32.5 g/dL (ref 30.0–36.0)
MCV: 93.3 fL (ref 80.0–100.0)
Monocytes Absolute: 0.6 10*3/uL (ref 0.1–1.0)
Monocytes Relative: 3 %
NEUTROS PCT: 93 %
NRBC: 0 % (ref 0.0–0.2)
Neutro Abs: 16.3 10*3/uL — ABNORMAL HIGH (ref 1.7–7.7)
PLATELETS: 222 10*3/uL (ref 150–400)
RBC: 3.59 MIL/uL — AB (ref 3.87–5.11)
RDW: 14.7 % (ref 11.5–15.5)
WBC: 17.6 10*3/uL — ABNORMAL HIGH (ref 4.0–10.5)

## 2018-05-14 LAB — TYPE AND SCREEN
ABO/RH(D): AB POS
ANTIBODY SCREEN: NEGATIVE
UNIT DIVISION: 0
Unit division: 0

## 2018-05-14 MED ORDER — WARFARIN SODIUM 7.5 MG PO TABS
7.5000 mg | ORAL_TABLET | Freq: Once | ORAL | Status: AC
  Administered 2018-05-14: 7.5 mg via ORAL
  Filled 2018-05-14: qty 1

## 2018-05-14 MED ORDER — SODIUM CHLORIDE 0.9 % IV SOLN
10.0000 mg | Freq: Once | INTRAVENOUS | Status: AC
  Administered 2018-05-14: 10 mg via INTRAVENOUS
  Filled 2018-05-14: qty 1

## 2018-05-14 MED ORDER — SODIUM CHLORIDE 0.9 % IV SOLN: INTRAVENOUS | Status: DC

## 2018-05-14 MED ORDER — SODIUM CHLORIDE 0.9 % IV SOLN
90.0000 mg/m2 | Freq: Once | INTRAVENOUS | Status: AC
  Administered 2018-05-14: 180 mg via INTRAVENOUS
  Filled 2018-05-14: qty 9

## 2018-05-14 NOTE — Progress Notes (Signed)
[place] for LMWH/warfarin  Indication: MVR, atrial fibrillation    Allergies  Allergen Reactions  . Prochlorperazine Edisylate Other (See Comments)    Nervous/ flutter/ shakes--compazine  . Adhesive [Tape] Other (See Comments)    Redness/skin peeling Redness/hives from adhesive tape( tolerates latex gloves); tolerates paper tape  . Prochlorperazine Other (See Comments)    Jitters, hyper    Patient Measurements: Height: 5\' 6"  (167.6 cm) Weight: 197 lb (89.4 kg) IBW/kg (Calculated) : 59.3  Vital Signs: Temp: 97.9 F (36.6 C) (11/21 1249) Temp Source: Oral (11/21 1249) BP: 108/70 (11/21 1249) Pulse Rate: 75 (11/21 1249)  Labs: Recent Labs    05/12/18 0500  05/12/18 0625 05/13/18 0451 05/14/18 0844 05/14/18 0906  HGB  --    < > 10.2* 9.1* 10.9*  --   HCT  --   --  31.6* 28.6* 33.5*  --   PLT  --   --  225 223 222  --   LABPROT 13.5  --   --  14.5  --  15.7*  INR 1.04  --   --  1.14  --  1.26  CREATININE  --   --  1.08* 1.04* 1.50*  --    < > = values in this interval not displayed.    Estimated Creatinine Clearance: 46 mL/min (A) (by C-G formula based on SCr of 1.5 mg/dL (H)).   Assessment: 57 yo F on warfarin/LMWH PTA (resumed after biopsy 05/01/18) for history of transcatheter mitral valve replacement and PAF. Home dose reported as warfarin 7.5mg  on M/W/F and 5mg  on all other days of the week, as well as LMWH 80mg  SQ q12h. Last doses of both PTA on 05/04/18. INR 1.61 on admission, which is below coumadin clinic goal of 2-3. Anticoagulation resumed on admission.   Significant events: 11/15: INR was reversed in preparation for PAC. 11/18: PAC placed. Warfarin resumed. Continue enoxaparin for now for bridging until INR is therapeutic per MD.   Today, 05/14/18  INR increased to 1.26 today, remains subtherapeutic  Hgb  10.9 > 10.2 > 9.1> 10.9, Pltc stable  Transfusing 2 units PRBCs 11/20  No bleeding issues reported per  nursing  SCr 1.50 with CrCl ~ 46 ml/min   Day 3 of chemotherapy, to receive etoposide today    Both cisplatin and etoposide can increase INR; will monitor INR very closely while inpatient   Also on concurrent EC aspirin 81mg  daily   Goal of Therapy:  INR 2-3 Monitor platelets by anticoagulation protocol: Yes   Plan:   Warfarin 7.5 mg PO x 1  Continue enoxaparin 1mg /kg SQ q12h until INR therapeutic   Daily PT/INR  Monitor renal function, esp with sharp SCr increase today   Monitor CBC and for s/sx of bleeding    Royetta Asal, PharmD, BCPS Pager [phone] 05/14/2018 5:25 PM

## 2018-05-14 NOTE — Progress Notes (Addendum)
GYN Oncology  Patient alert, oriented, sitting on the side of the bed in no acute distress.  States she feels slightly worn down today from the recent chemo but states her pain seems to be better.  "Yesterday I spent the day trying to figure out if I was nauseous or not."  No emesis reported.  No concerns voiced.  Plan for patient to follow up with Dr. Fermin Schwab in the office on Tuesday, Nov 26 if she has been discharged.  GYN ONC will continue to follow and is available if any needs arise.

## 2018-05-14 NOTE — Telephone Encounter (Signed)
Returned pt's call. Per Dr. Sondra Come, pt may use heating pad to "tail bone" as long as pt is cautious and does not burn her skin. Pt verbalized understanding and agreement. Loma Sousa, RN BSN

## 2018-05-14 NOTE — Progress Notes (Signed)
Chemotherapy dosage and calculations checked and reviewed with Aldean Baker RN.

## 2018-05-14 NOTE — Progress Notes (Signed)
[name] is doing okay.  She had a blood transfusion late yesterday.  She did not sleep all that well.  She has done well with chemotherapy so far.  She has the last dose of etoposide today.  Her pain is getting better.  She is not using as much short acting pain medication.  Her radiation is doing well.  She is had no spasms.  There is been no problems with bowels or bladder.  She is had no bleeding.  Her appetite is improving.  Her creatinine is up a little bit.  We may have to increase her rate of IV fluid.  There is been no fever.  She has had no leg swelling.  She is out of bed.  Her vital signs look stable.  There is no change on her overall physical exam.  Hopefully, we will be able to get her home tomorrow.  Lattie Haw, MD  John 1:2

## 2018-05-15 ENCOUNTER — Telehealth: Payer: Self-pay | Admitting: Oncology

## 2018-05-15 ENCOUNTER — Ambulatory Visit
Admit: 2018-05-15 | Discharge: 2018-05-15 | Disposition: A | Discharge status: Home / Self Care | Payer: BLUE CROSS/BLUE SHIELD | Attending: Radiation Oncology | Admitting: Radiation Oncology

## 2018-05-15 ENCOUNTER — Encounter (HOSPITAL_COMMUNITY): Payer: Self-pay

## 2018-05-15 LAB — COMPREHENSIVE METABOLIC PANEL
ALT: 24 U/L (ref 0–44)
AST: 19 U/L (ref 15–41)
Albumin: 3.1 g/dL — ABNORMAL LOW (ref 3.5–5.0)
Alkaline Phosphatase: 47 U/L (ref 38–126)
Anion gap: 7 (ref 5–15)
BUN: 26 mg/dL — ABNORMAL HIGH (ref 6–20)
CHLORIDE: 101 mmol/L (ref 98–111)
CO2: 26 mmol/L (ref 22–32)
CREATININE: 1.28 mg/dL — AB (ref 0.44–1.00)
Calcium: 8.4 mg/dL — ABNORMAL LOW (ref 8.9–10.3)
GFR calc non Af Amer: 45 mL/min — ABNORMAL LOW (ref >60)
GFR, EST AFRICAN AMERICAN: 52 mL/min — AB (ref >60)
Glucose, Bld: 114 mg/dL — ABNORMAL HIGH (ref 70–99)
Potassium: 4.6 mmol/L (ref 3.5–5.1)
SODIUM: 134 mmol/L — AB (ref 135–145)
Total Bilirubin: 0.6 mg/dL (ref 0.3–1.2)
Total Protein: 6 g/dL — ABNORMAL LOW (ref 6.5–8.1)

## 2018-05-15 LAB — CBC WITH DIFFERENTIAL/PLATELET
ABS IMMATURE GRANULOCYTES: 0.17 10*3/uL — AB (ref 0.00–0.07)
BASOS PCT: 0 %
Basophils Absolute: 0 10*3/uL (ref 0.0–0.1)
Eosinophils Absolute: 0 10*3/uL (ref 0.0–0.5)
Eosinophils Relative: 0 %
HCT: 33.4 % — ABNORMAL LOW (ref 36.0–46.0)
Hemoglobin: 10.8 g/dL — ABNORMAL LOW (ref 12.0–15.0)
IMMATURE GRANULOCYTES: 1 %
Lymphocytes Relative: 2 %
Lymphs Abs: 0.2 10*3/uL — ABNORMAL LOW (ref 0.7–4.0)
MCH: 29.8 pg (ref 26.0–34.0)
MCHC: 32.3 g/dL (ref 30.0–36.0)
MCV: 92.3 fL (ref 80.0–100.0)
MONO ABS: 0.3 10*3/uL (ref 0.1–1.0)
Monocytes Relative: 3 %
NEUTROS ABS: 11.3 10*3/uL — AB (ref 1.7–7.7)
NEUTROS PCT: 94 %
PLATELETS: 209 10*3/uL (ref 150–400)
RBC: 3.62 MIL/uL — AB (ref 3.87–5.11)
RDW: 14.6 % (ref 11.5–15.5)
WBC: 12.1 10*3/uL — AB (ref 4.0–10.5)
nRBC: 0 % (ref 0.0–0.2)

## 2018-05-15 LAB — PROTIME-INR
INR: 1.76
Prothrombin Time: 20.3 seconds — ABNORMAL HIGH (ref 11.4–15.2)

## 2018-05-15 MED ORDER — GABAPENTIN 300 MG PO CAPS: 300.0000 mg | ORAL_CAPSULE | Freq: Three times a day (TID) | ORAL | 2 refills | Status: DC

## 2018-05-15 MED ORDER — HEPARIN SOD (PORK) LOCK FLUSH 10 UNIT/ML IV SOLN
5.0000 [IU] | Freq: Once | INTRAVENOUS | Status: DC
  Filled 2018-05-15: qty 0.5

## 2018-05-15 MED ORDER — METHOCARBAMOL 500 MG PO TABS: 500.0000 mg | ORAL_TABLET | Freq: Four times a day (QID) | ORAL | 2 refills | Status: DC | PRN

## 2018-05-15 MED ORDER — WARFARIN - PHYSICIAN DOSING INPATIENT: Freq: Every day | Status: DC

## 2018-05-15 MED ORDER — OXYCODONE HCL ER 20 MG PO T12A: 20.0000 mg | EXTENDED_RELEASE_TABLET | Freq: Two times a day (BID) | ORAL | 0 refills | Status: DC

## 2018-05-15 MED ORDER — WARFARIN SODIUM 5 MG PO TABS: 7.5000 mg | ORAL_TABLET | Freq: Every day | ORAL | Status: DC

## 2018-05-15 MED ORDER — SODIUM CHLORIDE 0.9 % IV SOLN
150.0000 mg | Freq: Once | INTRAVENOUS | Status: AC
  Administered 2018-05-15: 150 mg via INTRAVENOUS
  Filled 2018-05-15: qty 5

## 2018-05-15 MED ORDER — WARFARIN SODIUM 7.5 MG PO TABS: 7.5000 mg | ORAL_TABLET | Freq: Every day | ORAL | 2 refills | Status: DC

## 2018-05-15 MED ORDER — HEPARIN SOD (PORK) LOCK FLUSH 100 UNIT/ML IV SOLN
500.0000 [IU] | Freq: Once | INTRAVENOUS | Status: AC
  Administered 2018-05-15: 500 [IU]
  Filled 2018-05-15: qty 5

## 2018-05-15 MED ORDER — PALONOSETRON HCL INJECTION 0.25 MG/5ML
0.2500 mg | Freq: Once | INTRAVENOUS | Status: AC
  Administered 2018-05-15: 0.25 mg via INTRAVENOUS
  Filled 2018-05-15 (×2): qty 5

## 2018-05-15 MED ORDER — SODIUM CHLORIDE 0.9 % IV SOLN: Freq: Once | INTRAVENOUS | Status: DC

## 2018-05-15 NOTE — Discharge Summary (Signed)
Physician Discharge Summary  Patient ID: [name] @ATTENDINGNPI @ MRN: [id number] DOB/AGE: 07/25/1960 57 y.o.  Admit date: 05/04/2018 Discharge date: 05/15/2018  Admission Diagnoses: Recurrent granulosa cell tumor of the ovary with severe pain Port-A-Cath placement for chemotherapy Radiation therapy has been initiated Cycle #1 of low-dose chemotherapy with cis-platinum/etoposide Intractable pain  Discharge Diagnoses:  Principal Problem:   Intractable pain Active Problems:   Chronic systolic CHF (congestive heart failure) (HCC)   S/P mitral valve replacement with bioprosthetic valve   CKD (chronic kidney disease) stage 3, GFR 30-59 ml/min (HCC)   Paroxysmal atrial fibrillation (HCC)   COPD (chronic obstructive pulmonary disease) (HCC)   Abdominal pain   Hypokalemia   Discharged Condition: stable  Discharge Labs:   Significant Diagnostic Studies: None  Consults: None  Disposition: Discharge disposition: 01-Home or Self Care       Treatments: anticoagulation: warfarin    Follow-up Information    Volanda Napoleon, MD. Schedule an appointment as soon as possible for a visit in 7 day(s).   Specialty:  Oncology Contact information: [address] [place] [address] [phone]           Hospital Course: [name] was admitted to the hospital on 05/05/2018.  She is a 57 year old white female with on the third recurrence of her granulosa cell tumor.  This is of the ovary.  She had CT scans that showed extensive pelvic recurrence.  She had a lot of pain and pain cannot be controlled at home.  As such she was admitted.  She was started on IV pain medication.  I then switched over to oral medication with OxyContin at 20 mg twice a day.  She is on Dilaudid at 4 mg every 4 hours as needed.  We started radiation therapy on her.  She started radiation therapy on 05/07/2018.  She had a Port-A-Cath placed on 05/11/2018.  When she had a  Port-A-Cath placed, we are going to start low-dose chemotherapy.  I felt that combination chemotherapy and radiation therapy would help because more rapid tumor shrinkage which would help her pain.  She received cis-platinum (60 mg/m) on day #1 and then etoposide for the (60mg /m2) on day #1-3.  She tolerated treatment quite well.  We did give her a blood transfusion on 05/13/2018.  At that time, her hemoglobin was 9.1.  Given the fact that she was getting treatment and had decreased capacity to make red blood cells, I felt that we are needed to transfuse her.  She was transfused with 2 units of packed red blood cells and she tolerated this very well.  She improved slowly but surely throughout the hospital course.  Her pain was much better controlled.  She was only requiring the breakthrough hydromorphone 2 or 3 times a day.  Para she was walking.  She was eating better.  Her lab work on the day of discharge showed a white cell count 12.1, hemoglobin 10.8, and platelet count 209,000.  Her sodium was 134, potassium 4.6, glucose 114, BUN 26, creatinine 1.28.  Her calcium is 8.4.  Her liver studies were all normal.  Of note, her inhibin-B level was 1600 prior to treatment.  She had been on Coumadin.  We have switched her off the Coumadin onto Lovenox.  She has a pacemaker.  Pharmacy was instrumental in making the adjustment and after her Port-A-Cath was placed, we restarted her Coumadin.  On the day of discharge, her INR is 1.76.  I felt  that this was adequate for her to be discharged.  She is ready to go home on the day of 05/15/2018.  Her vital signs show temperature of 98.1.  Pulse 71.  Blood pressure 109/64.  Oxygen saturation was 95%.  Head neck exam shows no ocular or oral lesions.  There are no palpable cervical or supraclavicular lymph nodes.  Lungs are clear bilaterally.  Cardiac exam regular rate and rhythm with no murmurs, rubs or bruits.  Abdomen is soft.  She has good bowel sounds.  There  is some slight abdominal distention.  There is no guarding or rebound tenderness.  She has no abdominal mass.  There is no palpable liver or spleen tip.  Extremities shows no clubbing, cyanosis or edema.  She has good range of motion of her joints.  She has good strength in her extremities.  Neurological exam shows no focal neurological deficits.  Discharge Exam: See above Blood pressure 109/64, pulse 71, temperature 98.1 F (36.7 C), temperature source Oral, resp. rate 18, height 5\' 6"  (1.676 m), weight 197 lb (89.4 kg), SpO2 95 %.     Signed: Volanda Napoleon 05/15/2018, 1:31 PM

## 2018-05-15 NOTE — Telephone Encounter (Signed)
Called [place] and let her know that we have called [facility] and the OxyContin prescription needs a prior authorization.  Advised her that I will call and let [name] nurse know.  Lorenda Cahill, RN with [name] office and she will forward the request to Otilio Carpen, Estate manager/land agent.

## 2018-05-15 NOTE — Progress Notes (Signed)
[place] for LMWH/warfarin  Indication: MVR, atrial fibrillation    Allergies  Allergen Reactions  . Prochlorperazine Edisylate Other (See Comments)    Nervous/ flutter/ shakes--compazine  . Adhesive [Tape] Other (See Comments)    Redness/skin peeling Redness/hives from adhesive tape( tolerates latex gloves); tolerates paper tape  . Prochlorperazine Other (See Comments)    Jitters, hyper    Patient Measurements: Height: 5\' 6"  (167.6 cm) Weight: 197 lb (89.4 kg) IBW/kg (Calculated) : 59.3  Vital Signs: Temp: 98.1 F (36.7 C) (11/22 0552) Temp Source: Oral (11/22 0552) BP: 109/64 (11/22 0552) Pulse Rate: 71 (11/22 0552)  Labs: Recent Labs    05/13/18 0451 05/14/18 0844 05/14/18 0906 05/15/18 0331  HGB 9.1* 10.9*  --  10.8*  HCT 28.6* 33.5*  --  33.4*  PLT 223 222  --  209  LABPROT 14.5  --  15.7* 20.3*  INR 1.14  --  1.26 1.76  CREATININE 1.04* 1.50*  --  1.28*    Estimated Creatinine Clearance: 53.9 mL/min (A) (by C-G formula based on SCr of 1.28 mg/dL (H)).   Assessment: 57 yo F on warfarin/LMWH PTA (resumed after biopsy 05/01/18) for history of transcatheter bioprosthetic mitral valve replacement and PAF. Home dose reported as warfarin 7.5mg  on M/W/F and 5mg  on all other days of the week, as well as LMWH 80mg  SQ q12h. Last doses of both PTA on 05/04/18. INR 1.61 on admission, which is below coumadin clinic goal of 2-3. Anticoagulation resumed on admission.   Significant events: 11/15: INR was reversed in preparation for PAC. 11/18: PAC placed. Warfarin resumed. Continue enoxaparin for now for bridging until INR is therapeutic per MD.   Today, 05/15/18  INR remains subtherapeutic but improving quickly  Hgb  10.9 > 10.2 > 9.1> 10.9, Pltc stable  Transfusing 2 units PRBCs 11/20  No bleeding issues reported per nursing  SCr 1.50 with CrCl ~ 46 ml/min   Day 3 of chemotherapy, to receive etoposide today    Both  cisplatin and etoposide can increase INR; will monitor INR very closely while inpatient   Also on concurrent EC aspirin 81mg  daily   Goal of Therapy:  INR 2-3 Monitor platelets by anticoagulation protocol: Yes   Plan:   Planning for discharge today  Recommend warfarin 7.5 mg today, then resume PTA dosing as above with INR recheck in 3-5 days  Continue enoxaparin 1mg /kg SQ q12h until INR therapeutic     Reuel Boom, PharmD, BCPS [phone] 05/15/2018, 1:29 PM

## 2018-05-15 NOTE — Progress Notes (Signed)
Patient resting in bed in no acute distress.  States she will be going home today.  No needs voiced. She is to follow up in the office on Tuesday and advised to call for any needs in between that time.

## 2018-05-16 ENCOUNTER — Inpatient Hospital Stay: Payer: BLUE CROSS/BLUE SHIELD

## 2018-05-17 ENCOUNTER — Ambulatory Visit: Payer: BLUE CROSS/BLUE SHIELD

## 2018-05-18 ENCOUNTER — Telehealth: Payer: Self-pay | Admitting: Hematology & Oncology

## 2018-05-18 ENCOUNTER — Ambulatory Visit
Admission: RE | Admit: 2018-05-18 | Discharge: 2018-05-18 | Disposition: A | Discharge status: Home / Self Care | Payer: BLUE CROSS/BLUE SHIELD | Source: Ambulatory Visit | Attending: Radiation Oncology | Admitting: Radiation Oncology

## 2018-05-18 DIAGNOSIS — D3911 Neoplasm of uncertain behavior of right ovary: Secondary | ICD-10-CM | POA: Diagnosis present

## 2018-05-18 NOTE — Telephone Encounter (Signed)
lmom to inform pt of 11/29 appt at 1130 am per sch msg

## 2018-05-19 ENCOUNTER — Other Ambulatory Visit: Payer: Self-pay | Admitting: Radiation Oncology

## 2018-05-19 ENCOUNTER — Inpatient Hospital Stay (HOSPITAL_BASED_OUTPATIENT_CLINIC_OR_DEPARTMENT_OTHER): Payer: BLUE CROSS/BLUE SHIELD | Admitting: Gynecology

## 2018-05-19 ENCOUNTER — Ambulatory Visit
Admission: RE | Admit: 2018-05-19 | Discharge: 2018-05-19 | Disposition: A | Discharge status: Home / Self Care | Payer: BLUE CROSS/BLUE SHIELD | Source: Ambulatory Visit | Attending: Radiation Oncology | Admitting: Radiation Oncology

## 2018-05-19 ENCOUNTER — Encounter: Payer: Self-pay | Admitting: Gynecology

## 2018-05-19 VITALS — BP 99/69 | HR 99 | Temp 97.9°F | Resp 18 | Wt 188.6 lb

## 2018-05-19 DIAGNOSIS — D3910 Neoplasm of uncertain behavior of unspecified ovary: Secondary | ICD-10-CM

## 2018-05-19 DIAGNOSIS — Z9221 Personal history of antineoplastic chemotherapy: Secondary | ICD-10-CM | POA: Diagnosis not present

## 2018-05-19 DIAGNOSIS — D3911 Neoplasm of uncertain behavior of right ovary: Secondary | ICD-10-CM | POA: Diagnosis not present

## 2018-05-19 DIAGNOSIS — Z90710 Acquired absence of both cervix and uterus: Secondary | ICD-10-CM

## 2018-05-19 DIAGNOSIS — Z923 Personal history of irradiation: Secondary | ICD-10-CM | POA: Diagnosis not present

## 2018-05-19 DIAGNOSIS — F17210 Nicotine dependence, cigarettes, uncomplicated: Secondary | ICD-10-CM | POA: Diagnosis not present

## 2018-05-19 DIAGNOSIS — Z90722 Acquired absence of ovaries, bilateral: Secondary | ICD-10-CM

## 2018-05-19 DIAGNOSIS — D569 Thalassemia, unspecified: Secondary | ICD-10-CM

## 2018-05-19 DIAGNOSIS — C569 Malignant neoplasm of unspecified ovary: Secondary | ICD-10-CM | POA: Diagnosis not present

## 2018-05-19 DIAGNOSIS — C7989 Secondary malignant neoplasm of other specified sites: Secondary | ICD-10-CM | POA: Diagnosis not present

## 2018-05-19 MED ORDER — HYDROMORPHONE HCL 4 MG PO TABS: 4.0000 mg | ORAL_TABLET | ORAL | 0 refills | Status: DC | PRN

## 2018-05-19 NOTE — Progress Notes (Signed)
Follow Up Note: Gyn-Onc  [name] 57 y.o. female  CC:  Chief Complaint  Patient presents with  . 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  The patient now has clearly progressive disease predominantly in the pelvis.  I have reviewed the CT scan images and do not believe that surgical intervention would be helpful.  Patient is currently being treated with whole pelvis radiation therapy (Dr. Gery Pray) and cisplatin and etoposide every 3 weeks (Dr. Burney Gauze).  Fortunately, the patient is currently having some early response with regard to pain control.  It is noted that the patient had genetic profiling and has 5 potentially actionable mutations that might help with further treatment planning.  I would be happy to see the patient back as needed.    Interval history: Patient returns today  for continuing follow-up.  I last interact with the patient in March 2018.  Apparently since that time she has not received any treatment for her granulosa cell tumor.  She recently presented with severe pain predominantly in the sacrum and coccyx requiring admission.  Imaging showed significant increased size of multiple pelvic metastases as well as a left hydronephrosis.  As part of that evaluation she was seen by Dr. Sondra Come and Marin Olp.  It is noted that her inhibin B is now 1602 (previously 426)  She is currently receiving whole pelvis radiation therapy with the palliative intent to control pain.  Dr. Marin Olp is gotten her started on chemotherapy using cisplatin and etoposide.  She reports that her pain is better. HPI:  [name] is a 57 year old female initially seen in consultation at the request of Dr. Burney Gauze regarding management of recurrent granulosa cell tumor.  She first underwent surgery in Feb 2007 for a stage I C granulosa cell tumor. She received 3 cycles of bleomycin, etoposide, and  cisplatin chemotherapy as an adjunct.  Due to recurrence on CT imaging, she underwent a radical debulking of recurrent ovarian cancer including rectosigmoid resection with low rectal anastomosis, resection of right distal ureter, ureteroneocystostomy with psoas hitch, omentectomy with Dr. Fermin Schwab on 01/19/13.  Postoperatively, adjuvant therapy was recommended using carboplatin and Taxol. She received 4 cycles but did not tolerated very well and therefore discontinued. He CT scan in January 2015 showed a subcutaneous nodule in the lower abdomen and the inhibin B is a 39 units per mL.  On 07/27/2013, she underwent a wide local excision of subcutaneous tumor nodule (3 cm) with Dr. Fermin Schwab for recurrent disease.  She was placed on Femara and has been tolerating well.  Recently, CT imaging was performed on 06/15/15 that resulted mixed treatment response in the peritoneal tumor implants, significantly decreased size of the right paracolic gutter tumor implants, interval growth of the left pelvic side wall tumor, with no new sites of metastatic disease.  She was advised to continue taking the Femara with follow up scan for Feb or March.  Inhibin B increasing with value in Feb 2017 at 112.  Follow up imaging in March 2017 resulted: Mild progression of tumor in the left pelvis with increasing sidewall and perirectal nodularity. 2. Stable minimal nodularity in the right pericolic gutter. No other peritoneal disease identified. 3. No new lesions identified.     On September 19, 2015, she underwent an Exploratory laparotomy, radical resection of recurrent granulosa cell tumor including complete dissection of the right pararectal space and excision of tumor nodules, left  ureterolysis for retroperitoneal fibrosis, resection of nodule from the left obturator space, resection of nodules from the right paracolic gutter, right para-aortic lymphadenectomy by Dr. Fermin Schwab.  Her post-operative course was uneventful  except for low grade temperatures on post-op day 3.  Blood cultures still pending.  Urine culture with mixed flora.  No further fevers reported during hospital stay.  Final path revealed: Final Diagnosis  A: Gutter nodule, left paracolic, removal  - Implant of granulosa cell tumor, at least 1.0 cm (see comment)  B: Lymph node, right paraaortic, removal  - No tumor identified in one lymph node (0/1)  C: Mass and right ovarian vessels, excision  - Segment of right fallopian tube with evidence of prior surgical transection, focal dilatation, and chronic perisalpingitis  - Medium-caliber vessels with intraluminal thrombus  - No granulosa cell tumor identified  D: Pelvic side wall nodule, right, biopsy  - Two implants of granulosa cell tumor, 0.7 cm and 0.6 cm  E: Mass, left pararectal, biopsy  - Cystic implant of granulosa cell tumor, 0.8 cm  F: Mass, left, pararectal, biopsy  - Three implants of granulosa cell tumor, up to 1.2 cm  - One lymph node negative for granulosa cell tumor (0/1)  G: Nodule, left pelvis bladder, biopsy  - Minute focus of granulosa cell tumor identified at the edge of fibrovascular and adipose tissue, may represent artifact  H: Pelvic mass, deep left, excision  - Implant of granulosa cell tumor, 2.2 cm, with cystic change   Comment  The tumor implants noted in specimens A and D-H display the characteristic architectural and cytologic features of a granulosa cell tumor, including microfollicular pattern, blood-filled cysts, nuclear grooves, and Call-Exner bodies. In some sections, the tumor nodules have apparent fibrous capsules. We favor that these are reactive capsules, but cannot exclude the possibility of granulosa cell tumor involving completely-replaced lymph nodes. The tumor was 3+ progesterone receptor, 1+ estrogen receptor positive.   She was placed on tamoxifen 20 mg daily for 2 weeks followed by Megace 40 mg 3 times a day for 2 weeks. This was begun on  11/24/2015. At that time and baseline inhibin B was obtained (201) after 3 months of therapy the inhibin B had fallen to 66.Marland Kitchen Unfortunately, beginning in December 2017 her inhibin B began to rise and was 122 units in March 2018. At this juncture we discontinued tamoxifen and Megace.  Between March 2018 and November 2019 the patient did not make herself available for medical care and therefore received no treatment.  In mid November 2019 the patient presented with severe pain where tumor progression, (especially in the pelvis) was not clearly documented.  Treatment plan then became whole pelvis radiation therapy as well as systemic therapy using etoposide and cisplatin.   Review of Systems 10 point Are answered review of systems is negative except as noted above.  Vitals:  Blood pressure 105/62, pulse 82, temperature 97.8 F (36.6 C), temperature source Oral, resp. rate 18, height 5\' 6"  (1.676 m), weight 210 lb 4.8 oz (95.391 kg), SpO2 100 %.  Physical Exam:  General: Well developed, well nourished female in no acute distress. Alert and oriented x 3.   .  Skin: No rashes or lesions present. Back: No CVA tenderness.  Abdomen: Abdomen soft, non-tender and mildly obese. Active bowel sounds in all quadrants. Midline incision well-healed.  Extremities: No bilateral cyanosis, edema, or clubbing.   Pelvic exam:  EGBUS: Normal  Vagina: Normal without lesions  Cervix and uterus surgically absent.  Bimanual reveals a smooth 6 cm mass in the central pelvis.  I am unable to palpate the mass over the sacrum.  Current Meds:  Outpatient Encounter Medications as of 05/19/2018  Medication Sig  . aspirin EC 81 MG EC tablet Take 1 tablet (81 mg total) by mouth daily.  . carvedilol (COREG) 3.125 MG tablet Take 1 tablet (3.125 mg total) by mouth 2 (two) times daily with a meal.  . furosemide (LASIX) 20 MG tablet TAKE 1 TAB BY MOUTH DAILY. MAY TAKE AN EXTRA TAB AS NEEDED FOR LEG SWELLING OR SHORTNESS OF  BREATH (Patient taking differently: Take 20 mg by mouth daily. )  . gabapentin (NEURONTIN) 300 MG capsule Take 1 capsule (300 mg total) by mouth 3 (three) times daily.  Marland Kitchen HYDROmorphone (DILAUDID) 4 MG tablet Take 1 tablet (4 mg total) by mouth every 4 (four) hours as needed for severe pain.  Marland Kitchen LORazepam (ATIVAN) 0.5 MG tablet Take 1 tablet (0.5 mg total) by mouth every 6 (six) hours as needed (for nausea and vomiting).  . methocarbamol (ROBAXIN) 500 MG tablet Take 1 tablet (500 mg total) by mouth every 6 (six) hours as needed for muscle spasms.  Marland Kitchen oxyCODONE (OXYCONTIN) 20 mg 12 hr tablet Take 1 tablet (20 mg total) by mouth every 12 (twelve) hours.  Marland Kitchen spironolactone (ALDACTONE) 25 MG tablet Take 1 tablet (25 mg total) by mouth daily.  Marland Kitchen warfarin (COUMADIN) 7.5 MG tablet Take 1 tablet (7.5 mg total) by mouth daily at 6 PM.  . zolpidem (AMBIEN) 5 MG tablet TAKE 1 TABLET BY MOUTH EVERYDAY AT BEDTIME (Patient taking differently: Take 5 mg by mouth at bedtime. )  . [DISCONTINUED] acetaminophen (TYLENOL) 500 MG tablet Take 2 tablets (1,000 mg total) by mouth every 6 (six) hours as needed for moderate pain or headache.  . [DISCONTINUED] albuterol (PROVENTIL HFA;VENTOLIN HFA) 108 (90 Base) MCG/ACT inhaler Inhale 2 puffs into the lungs every 4 (four) hours as needed for wheezing or shortness of breath.  . [DISCONTINUED] b complex vitamins tablet Take 1 tablet by mouth 2 (two) times daily.   . [DISCONTINUED] cholecalciferol (VITAMIN D-400) 400 UNITS TABS tablet Take 400 Units by mouth 2 (two) times daily.  . [DISCONTINUED] diphenhydrAMINE (BENADRYL) 25 MG tablet Take 25 mg by mouth every 6 (six) hours as needed for itching or allergies.   . [DISCONTINUED] enoxaparin (LOVENOX) 80 MG/0.8ML injection Inject 0.8 mLs (80 mg total) into the skin every 12 (twelve) hours.  . [DISCONTINUED] HYDROmorphone (DILAUDID) 2 MG tablet Take 2 mg by mouth every 4 (four) hours.  . [DISCONTINUED] Vitamin D, Ergocalciferol,  (DRISDOL) 50000 units CAPS capsule TAKE 1 CAPSULE (50,000 UNITS TOTAL) BY MOUTH EVERY 7 (SEVEN) DAYS. (Patient taking differently: Take 50,000 Units by mouth every Thursday. )  . [DISCONTINUED] warfarin (COUMADIN) 5 MG tablet Take 1-1.5 tablets (5-7.5 mg total) by mouth See admin instructions. Take 7.5 mg by mouth daily on Monday, Wednesday and Friday. Take 5 mg by mouth daily on all other days.  . [EXPIRED] dexamethasone (DECADRON) 10 mg in sodium chloride 0.9 % 50 mL IVPB   . [EXPIRED] etoposide (VEPESID) 180 mg in sodium chloride 0.9 % 500 mL chemo infusion   . furosemide (LASIX) injection 20 mg   . furosemide (LASIX) injection 20 mg   . [DISCONTINUED] 0.9 %  sodium chloride infusion (Manually program via Guardrails IV Fluids)   . [DISCONTINUED] acetaminophen (TYLENOL) tablet 1,000 mg   . [DISCONTINUED] albuterol (PROVENTIL) (2.5 MG/3ML) 0.083% nebulizer solution  3 mL   . [DISCONTINUED] alum & mag hydroxide-simeth (MAALOX/MYLANTA) 200-200-20 MG/5ML suspension 30 mL   . [DISCONTINUED] aspirin EC tablet 81 mg   . [DISCONTINUED] carvedilol (COREG) tablet 3.125 mg   . [DISCONTINUED] enoxaparin (LOVENOX) injection 90 mg   . [DISCONTINUED] furosemide (LASIX) tablet 20 mg   . [DISCONTINUED] gabapentin (NEURONTIN) capsule 300 mg   . [DISCONTINUED] HYDROmorphone (DILAUDID) injection 2 mg   . [DISCONTINUED] HYDROmorphone (DILAUDID) tablet 2 mg   . [DISCONTINUED] lidocaine (XYLOCAINE) 2 % viscous mouth solution 15 mL   . [DISCONTINUED] LORazepam (ATIVAN) tablet 0.5 mg   . [DISCONTINUED] methocarbamol (ROBAXIN) tablet 500 mg   . [DISCONTINUED] oxyCODONE (OXYCONTIN) 12 hr tablet 20 mg   . [DISCONTINUED] phenylephrine-shark liver oil-mineral oil-petrolatum (PREPARATION H) rectal ointment   . [DISCONTINUED] polyethylene glycol (MIRALAX / GLYCOLAX) packet 17 g   . [DISCONTINUED] spironolactone (ALDACTONE) tablet 25 mg   . [DISCONTINUED] Warfarin - Pharmacist Dosing Inpatient   . [DISCONTINUED] zolpidem  (AMBIEN) tablet 5 mg    No facility-administered encounter medications on file as of 05/19/2018.     Allergy:  Allergies  Allergen Reactions  . Prochlorperazine Edisylate Other (See Comments)    Nervous/ flutter/ shakes--compazine  . Adhesive [Tape] Other (See Comments)    Redness/skin peeling Redness/hives from adhesive tape( tolerates latex gloves); tolerates paper tape  . Prochlorperazine Other (See Comments)    Jitters, hyper    Social Hx:   Social History   Socioeconomic History  . Marital status: Divorced    Spouse name: Not on file  . Number of children: Not on file  . Years of education: Not on file  . Highest education level: Not on file  Occupational History    Comment: WORKS FULL TIME  Social Needs  . Financial resource strain: Not on file  . Food insecurity:    Worry: Not on file    Inability: Not on file  . Transportation needs:    Medical: Not on file    Non-medical: Not on file  Tobacco Use  . Smoking status: Current Some Day Smoker    Packs/day: 0.50    Years: 30.00    Pack years: 15.00    Types: Cigarettes    Start date: 03/28/1974  . Smokeless tobacco: Never Used  Substance and Sexual Activity  . Alcohol use: No    Alcohol/week: 0.0 standard drinks  . Drug use: No  . Sexual activity: Not on file  Lifestyle  . Physical activity:    Days per week: Not on file    Minutes per session: Not on file  . Stress: Not on file  Relationships  . Social connections:    Talks on phone: Patient refused    Gets together: Patient refused    Attends religious service: Patient refused    Active member of club or organization: Patient refused    Attends meetings of clubs or organizations: Patient refused    Relationship status: Patient refused  . Intimate partner violence:    Fear of current or ex partner: Patient refused    Emotionally abused: Patient refused    Physically abused: Patient refused    Forced sexual activity: Patient refused  Other Topics  Concern  . Not on file  Social History Narrative   WORKS FULL TIME   SINGLE   TOBACCO USE-YES   IMPLANTATION OF DUAL-CHAMBER St. JUDE DEFIBRILLATOR    Past Surgical Hx:  Past Surgical History:  Procedure Laterality Date  . ABDOMINAL HYSTERECTOMY N/A  07/27/2013   Procedure: TUMOR EXCISION OF ABDOMINAL MASS;  Surgeon: Alvino Chapel, MD;  Location: [facility];  Service: Gynecology;  Laterality: N/A;  . APPENDECTOMY  1989  . BILATERAL OOPHORECTOMY  2007  . CARDIAC DEFIBRILLATOR PLACEMENT  02/06/2012   "lead change"  . CARDIAC VALVE REPLACEMENT    . CYSTOSCOPY Right 02/23/2013   Procedure: CYSTOSCOPY WITH STENT REMOVAL;  Surgeon: Alvino Chapel, MD;  Location: [facility];  Service: Gynecology;  Laterality: Right;  . IMPLANTABLE CARDIOVERTER DEFIBRILLATOR REVISION N/A 02/06/2012   Procedure: IMPLANTABLE CARDIOVERTER DEFIBRILLATOR REVISION;  Surgeon: Evans Lance, MD;  Location: [facility] CATH [facility];  Service: Cardiovascular;  Laterality: N/A;  . INSERT / REPLACE / REMOVE PACEMAKER  10/2009   DUAL-CHAMBER St. JUDE DEFIBRILLATOR  . IR IMAGING GUIDED PORT INSERTION  05/11/2018  . LAPAROTOMY N/A 01/19/2013   Procedure: TUMOR DEBULKING / BOWEL RESECTION/INSERTION RIGHT UTERERAL DOUBLE J STENT/OMENTECTOMY;  Surgeon: Alvino Chapel, MD;  Location: [facility];  Service: Gynecology;  Laterality: N/A;  . MITRAL VALVE REPLACEMENT  10/21/2009   40mm Edwards Perimount bovine pericardial tissue valve - [facility]  . RIGHT/LEFT HEART CATH AND CORONARY ANGIOGRAPHY N/A 07/02/2017   Procedure: RIGHT/LEFT HEART CATH AND CORONARY ANGIOGRAPHY;  Surgeon: Belva Crome, MD;  Location: [place] CV LAB;  Service: Cardiovascular;  Laterality: N/A;  . TEE WITHOUT CARDIOVERSION N/A 09/16/2017   Procedure: TRANSESOPHAGEAL ECHOCARDIOGRAM (TEE);  Surgeon: [name], MD;  Location: [place];  Service: Open Heart Surgery;  Laterality: N/A;  . THYMECTOMY  2009  . TONSILLECTOMY AND ADENOIDECTOMY  ~ 1967  .  TOTAL HIP ARTHROPLASTY  05/06/2012   Procedure: TOTAL HIP ARTHROPLASTY;  Surgeon: Gearlean Alf, MD;  Location: [facility];  Service: Orthopedics;  Laterality: Right;  . TRANSCATHETER MITRAL VALVE REPLACEMENT, TRANSAPICAL N/A 09/16/2017   Procedure: TRANSCATHETER MITRAL VALVE REPLACEMENT,TRANSAPICAL;  Surgeon: [name], MD;  Location: [place];  Service: Open Heart Surgery;  Laterality: N/A;  . TRANSURETHRAL RESECTION OF BLADDER  2009   "for bladder cancer"  . TUBAL LIGATION  1993  . VAGINAL HYSTERECTOMY  2001    Past Medical Hx:  Past Medical History:  Diagnosis Date  . Arthritis    "everywhere"  . Asthma    as a child  . Automatic implantable cardioverter-defibrillator in situ    DR. Beckie Salts   . Bladder cancer [facility]) 2009   "injected medicine to get rid of it"  . Chronic systolic CHF (congestive heart failure) ([place])   . CKD (chronic kidney disease), stage III ([place])   . Coronary artery disease    a. large RV/inferior MI complicated by pseudoaneurysm s/p aneurysemectomy and CABG 10/2009 with papillary muscle rupture requiring bioprosthetic mitral valve replacement in [facility].  . Granulosa cell carcinoma of ovary ([place])    recurrent - surgical resection 2007, 2014 and 2016   . H/O thymectomy   . High cholesterol   . Hypothyroidism   . ICD (implantable cardiac defibrillator) in place   . Ischemic cardiomyopathy    a.  ICM and VT arrest 10/2009 s/p AICD (revision 01/2012 due to RV lead problem).  . Myocardial infarction ([place]) 10/14/2009  . Neuropathy    FEET AND HANDS - FROM CHEMO - BUT MUCH IMPROVED  . PAF (paroxysmal atrial fibrillation) ([place])   . Pain    JOINT PAINS AND MUSCLE ACHES ALL OVER.  Marland Kitchen Port-A-Cath in place    RIGHT UPPER CHEST  . Prosthetic valve dysfunction   . S/P  mitral valve replacement with bioprosthetic valve 10/21/2009   Edwards Perimount stented bovine pericardial tissue valve, size 29 mm  . S/P valve-in-valve transcatheter mitral valve replacement  09/16/2017   29 mm Edwards Sapien 3 transcatheter heart valve placed via transapical approach  . SVT (supraventricular tachycardia) ([place])   . Tobacco abuse   . Ventricular tachycardia ([place])     Family Hx:  Family History  Problem Relation Age of Onset  . Heart attack Paternal Grandfather   . Heart attack Maternal Grandfather   . Stroke Maternal Grandmother   . Heart attack Brother         Marti Sleigh, NP 05/19/2018, 1:33 PM

## 2018-05-19 NOTE — Patient Instructions (Signed)
Plan to follow up with Dr. Sondra Come and Dr. Marin Olp and call our office for any needs.

## 2018-05-20 ENCOUNTER — Ambulatory Visit
Admission: RE | Admit: 2018-05-20 | Discharge: 2018-05-20 | Disposition: A | Discharge status: Home / Self Care | Payer: BLUE CROSS/BLUE SHIELD | Source: Ambulatory Visit | Attending: Radiation Oncology | Admitting: Radiation Oncology

## 2018-05-20 ENCOUNTER — Other Ambulatory Visit: Payer: Self-pay | Admitting: Family

## 2018-05-20 DIAGNOSIS — D500 Iron deficiency anemia secondary to blood loss (chronic): Secondary | ICD-10-CM

## 2018-05-20 DIAGNOSIS — D3911 Neoplasm of uncertain behavior of right ovary: Secondary | ICD-10-CM | POA: Diagnosis not present

## 2018-05-20 DIAGNOSIS — D3910 Neoplasm of uncertain behavior of unspecified ovary: Secondary | ICD-10-CM

## 2018-05-22 ENCOUNTER — Telehealth: Payer: Self-pay | Admitting: Hematology & Oncology

## 2018-05-22 ENCOUNTER — Inpatient Hospital Stay: Payer: BLUE CROSS/BLUE SHIELD

## 2018-05-22 ENCOUNTER — Other Ambulatory Visit: Payer: Self-pay | Admitting: *Deleted

## 2018-05-22 ENCOUNTER — Other Ambulatory Visit: Payer: Self-pay | Admitting: Hematology & Oncology

## 2018-05-22 ENCOUNTER — Encounter: Payer: Self-pay | Admitting: *Deleted

## 2018-05-22 ENCOUNTER — Encounter: Payer: Self-pay | Admitting: Family

## 2018-05-22 ENCOUNTER — Other Ambulatory Visit: Payer: Self-pay

## 2018-05-22 ENCOUNTER — Inpatient Hospital Stay (HOSPITAL_BASED_OUTPATIENT_CLINIC_OR_DEPARTMENT_OTHER): Payer: BLUE CROSS/BLUE SHIELD | Admitting: Family

## 2018-05-22 ENCOUNTER — Telehealth: Payer: Self-pay | Admitting: Family

## 2018-05-22 VITALS — BP 108/61 | HR 98 | Temp 98.0°F | Resp 18

## 2018-05-22 DIAGNOSIS — D3910 Neoplasm of uncertain behavior of unspecified ovary: Secondary | ICD-10-CM

## 2018-05-22 DIAGNOSIS — E860 Dehydration: Secondary | ICD-10-CM

## 2018-05-22 DIAGNOSIS — Z90722 Acquired absence of ovaries, bilateral: Secondary | ICD-10-CM

## 2018-05-22 DIAGNOSIS — C569 Malignant neoplasm of unspecified ovary: Secondary | ICD-10-CM

## 2018-05-22 DIAGNOSIS — K121 Other forms of stomatitis: Secondary | ICD-10-CM

## 2018-05-22 DIAGNOSIS — K1230 Oral mucositis (ulcerative), unspecified: Secondary | ICD-10-CM

## 2018-05-22 DIAGNOSIS — F17210 Nicotine dependence, cigarettes, uncomplicated: Secondary | ICD-10-CM

## 2018-05-22 DIAGNOSIS — Z90710 Acquired absence of both cervix and uterus: Secondary | ICD-10-CM

## 2018-05-22 DIAGNOSIS — Z5181 Encounter for therapeutic drug level monitoring: Secondary | ICD-10-CM

## 2018-05-22 DIAGNOSIS — Z9221 Personal history of antineoplastic chemotherapy: Secondary | ICD-10-CM | POA: Diagnosis not present

## 2018-05-22 DIAGNOSIS — D500 Iron deficiency anemia secondary to blood loss (chronic): Secondary | ICD-10-CM

## 2018-05-22 DIAGNOSIS — T451X5A Adverse effect of antineoplastic and immunosuppressive drugs, initial encounter: Secondary | ICD-10-CM

## 2018-05-22 DIAGNOSIS — Z923 Personal history of irradiation: Secondary | ICD-10-CM

## 2018-05-22 DIAGNOSIS — C7989 Secondary malignant neoplasm of other specified sites: Secondary | ICD-10-CM | POA: Diagnosis not present

## 2018-05-22 DIAGNOSIS — R110 Nausea: Secondary | ICD-10-CM

## 2018-05-22 LAB — CBC WITH DIFFERENTIAL (CANCER CENTER ONLY)
Abs Immature Granulocytes: 0 10*3/uL (ref 0.00–0.07)
BASOS ABS: 0 10*3/uL (ref 0.0–0.1)
Basophils Relative: 1 %
Eosinophils Absolute: 0.1 10*3/uL (ref 0.0–0.5)
Eosinophils Relative: 7 %
HCT: 36.8 % (ref 36.0–46.0)
Hemoglobin: 12.2 g/dL (ref 12.0–15.0)
Immature Granulocytes: 0 %
Lymphocytes Relative: 26 %
Lymphs Abs: 0.4 10*3/uL — ABNORMAL LOW (ref 0.7–4.0)
MCH: 29.6 pg (ref 26.0–34.0)
MCHC: 33.2 g/dL (ref 30.0–36.0)
MCV: 89.3 fL (ref 80.0–100.0)
Monocytes Absolute: 0.1 10*3/uL (ref 0.1–1.0)
Monocytes Relative: 8 %
Neutro Abs: 0.9 10*3/uL — ABNORMAL LOW (ref 1.7–7.7)
Neutrophils Relative %: 58 %
PLATELETS: 60 10*3/uL — AB (ref 150–400)
RBC: 4.12 MIL/uL (ref 3.87–5.11)
RDW: 13.6 % (ref 11.5–15.5)
WBC Count: 1.5 10*3/uL — ABNORMAL LOW (ref 4.0–10.5)
nRBC: 0 % (ref 0.0–0.2)

## 2018-05-22 LAB — CMP (CANCER CENTER ONLY)
ALT: 13 U/L (ref 0–44)
AST: 12 U/L — ABNORMAL LOW (ref 15–41)
Albumin: 3.6 g/dL (ref 3.5–5.0)
Alkaline Phosphatase: 53 U/L (ref 38–126)
Anion gap: 11 (ref 5–15)
BUN: 25 mg/dL — ABNORMAL HIGH (ref 6–20)
CO2: 29 mmol/L (ref 22–32)
Calcium: 8.3 mg/dL — ABNORMAL LOW (ref 8.9–10.3)
Chloride: 95 mmol/L — ABNORMAL LOW (ref 98–111)
Creatinine: 1.28 mg/dL — ABNORMAL HIGH (ref 0.44–1.00)
GFR, EST AFRICAN AMERICAN: 53 mL/min — AB (ref >60)
GFR, Estimated: 46 mL/min — ABNORMAL LOW (ref >60)
Glucose, Bld: 108 mg/dL — ABNORMAL HIGH (ref 70–99)
Potassium: 3.2 mmol/L — ABNORMAL LOW (ref 3.5–5.1)
Sodium: 135 mmol/L (ref 135–145)
Total Bilirubin: 0.4 mg/dL (ref 0.3–1.2)
Total Protein: 6 g/dL — ABNORMAL LOW (ref 6.5–8.1)

## 2018-05-22 LAB — IRON AND TIBC
Iron: 101 ug/dL (ref 41–142)
Saturation Ratios: 41 % (ref 21–57)
TIBC: 245 ug/dL (ref 236–444)
UIBC: 145 ug/dL (ref 120–384)

## 2018-05-22 LAB — FERRITIN: Ferritin: 211 ng/mL (ref 11–307)

## 2018-05-22 LAB — PROTIME-INR
INR: 2.24
PROTHROMBIN TIME: 24.4 s — AB (ref 11.4–15.2)

## 2018-05-22 MED ORDER — SODIUM CHLORIDE 0.9% FLUSH
10.0000 mL | INTRAVENOUS | Status: DC | PRN
  Administered 2018-05-22: 10 mL via INTRAVENOUS
  Filled 2018-05-22: qty 10

## 2018-05-22 MED ORDER — SODIUM CHLORIDE 0.9 % IV SOLN
Freq: Once | INTRAVENOUS | Status: AC
  Administered 2018-05-22: 13:00:00 via INTRAVENOUS
  Filled 2018-05-22: qty 250

## 2018-05-22 MED ORDER — MAGIC MOUTHWASH W/LIDOCAINE: 5.0000 mL | Freq: Three times a day (TID) | ORAL | 2 refills | Status: DC | PRN

## 2018-05-22 MED ORDER — LORAZEPAM 1 MG PO TABS: 1.0000 mg | ORAL_TABLET | Freq: Three times a day (TID) | ORAL | Status: DC | PRN

## 2018-05-22 MED ORDER — HYDROMORPHONE HCL 2 MG PO TABS: 2.0000 mg | ORAL_TABLET | ORAL | 0 refills | Status: DC | PRN

## 2018-05-22 MED ORDER — LIDOCAINE-PRILOCAINE 2.5-2.5 % EX CREA: 1.0000 "application " | TOPICAL_CREAM | CUTANEOUS | 0 refills | Status: DC | PRN

## 2018-05-22 MED ORDER — HEPARIN SOD (PORK) LOCK FLUSH 100 UNIT/ML IV SOLN
500.0000 [IU] | Freq: Once | INTRAVENOUS | Status: AC
  Administered 2018-05-22: 500 [IU] via INTRAVENOUS
  Filled 2018-05-22: qty 5

## 2018-05-22 NOTE — Progress Notes (Signed)
Hematology and Oncology Follow Up Visit  [name] [id number] 1961/02/03 57 y.o. 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 She is feeling fairly well.  She is feeling fatigued. She has occasional episodes of nausea, no vomiting.  She had some soreness inside the mouth and also in the sides of her mouth. No oral sores noted on today's exam.   INR is therapeutic at 2.24. She denies any episodes of bleeding, no bruising or petechiae.  No fever, chills, cough, rash, dizziness, SOB, chest pain, palpitations or changes in bowel or bladder habits.  She takes Mirilax daily to avoid constipation.  No swelling, tenderness, numbness or tingling in her extremities.  No lymphadenopathy noted on exam.  Her appetite is ok and she is trying her bast to stay hydrated. Her weight is stable.   ECOG Performance Status: 1 - Symptomatic but completely ambulatory  Medications:  Allergies as of 05/22/2018      Reactions   Prochlorperazine Edisylate Other (See Comments)   Nervous/ flutter/ shakes--compazine   Adhesive [tape] Other (See Comments)   Redness/skin peeling Redness/hives from adhesive tape( tolerates latex gloves); tolerates paper tape   Prochlorperazine Other (See Comments)   Jitters, hyper      Medication List        Accurate as of 05/22/18 12:21 PM. Always use your most recent med list.          aspirin 81 MG EC tablet Take 1 tablet (81 mg total) by mouth daily.   carvedilol 3.125 MG tablet Commonly known as:  COREG Take 1 tablet (3.125 mg total) by mouth 2 (two) times daily with a meal.   furosemide 20 MG tablet Commonly known as:  LASIX TAKE 1 TAB BY MOUTH DAILY. MAY TAKE AN EXTRA TAB AS NEEDED FOR LEG SWELLING OR SHORTNESS OF BREATH   gabapentin 300 MG capsule Commonly known as:   NEURONTIN Take 1 capsule (300 mg total) by mouth 3 (three) times daily.   HYDROmorphone 4 MG tablet Commonly known as:  DILAUDID Take 1 tablet (4 mg total) by mouth every 4 (four) hours as needed for severe pain.   lidocaine-prilocaine cream Commonly known as:  EMLA Apply 1 application topically as needed. Apply to port per instructions   LORazepam 0.5 MG tablet Commonly known as:  ATIVAN Take 1 tablet (0.5 mg total) by mouth every 6 (six) hours as needed (for nausea and vomiting).   methocarbamol 500 MG tablet Commonly known as:  ROBAXIN Take 1 tablet (500 mg total) by mouth every 6 (six) hours as needed for muscle spasms.   oxyCODONE 20 mg 12 hr tablet Commonly known as:  OXYCONTIN Take 1 tablet (20 mg total) by mouth every 12 (twelve) hours.   spironolactone 25 MG tablet Commonly known as:  ALDACTONE Take 1 tablet (25 mg total) by mouth daily.   warfarin 7.5 MG tablet Commonly known as:  COUMADIN Take as directed by the anticoagulation clinic. If you are unsure how to take this medication, talk to your nurse or doctor. Original instructions:  Take 1 tablet (7.5 mg total) by mouth daily at 6 PM.   zolpidem 5 MG tablet Commonly known as:  AMBIEN TAKE 1 TABLET BY MOUTH EVERYDAY AT BEDTIME       Allergies:  Allergies  Allergen Reactions  .  Prochlorperazine Edisylate Other (See Comments)    Nervous/ flutter/ shakes--compazine  . Adhesive [Tape] Other (See Comments)    Redness/skin peeling Redness/hives from adhesive tape( tolerates latex gloves); tolerates paper tape  . Prochlorperazine Other (See Comments)    Jitters, hyper    Past Medical History, Surgical history, Social history, and Family History were reviewed and updated.  Review of Systems: All other 10 point review of systems is negative.   Physical Exam:  vitals were not taken for this visit.   Wt Readings from Last 3 Encounters:  05/19/18 188 lb 9 oz (85.5 kg)  05/04/18 197 lb (89.4 kg)  05/01/18  197 lb (89.4 kg)    Ocular: Sclerae unicteric, pupils equal, round and reactive to light Ear-nose-throat: Oropharynx clear, dentition fair Lymphatic: No cervical, supraclavicular or axillary adenopathy Lungs no rales or rhonchi, good excursion bilaterally Heart regular rate and rhythm, no murmur appreciated Abd soft, nontender, positive bowel sounds, no liver or spleen tip palpated on exam, no fluid wave  MSK no focal spinal tenderness, no joint edema Neuro: non-focal, well-oriented, appropriate affect Breasts: Deferred   Lab Results  Component Value Date   WBC 1.5 (L) 05/22/2018   HGB 12.2 05/22/2018   HCT 36.8 05/22/2018   MCV 89.3 05/22/2018   PLT 60 (L) 05/22/2018   No results found for: FERRITIN, IRON, TIBC, UIBC, IRONPCTSAT Lab Results  Component Value Date   RBC 4.12 05/22/2018   No results found for: KPAFRELGTCHN, LAMBDASER, KAPLAMBRATIO No results found for: Kandis Cocking, IGMSERUM No results found for: Kathrynn Ducking, MSPIKE, SPEI   Chemistry      Component Value Date/Time   NA 134 (L) 05/15/2018 0331   NA 140 07/21/2017 1120   NA 140 03/15/2016 0953   K 4.6 05/15/2018 0331   K 3.9 03/15/2016 0953   CL 101 05/15/2018 0331   CL 102 12/13/2015 0803   CO2 26 05/15/2018 0331   CO2 19 (L) 03/15/2016 0953   BUN 26 (H) 05/15/2018 0331   BUN 13 07/21/2017 1120   BUN 15.9 03/15/2016 0953   CREATININE 1.28 (H) 05/15/2018 0331   CREATININE 1.40 (H) 03/06/2018 1140   CREATININE 1.4 (H) 03/15/2016 0953      Component Value Date/Time   CALCIUM 8.4 (L) 05/15/2018 0331   CALCIUM 9.3 03/15/2016 0953   ALKPHOS 47 05/15/2018 0331   ALKPHOS 55 03/15/2016 0953   AST 19 05/15/2018 0331   AST 19 03/06/2018 1140   AST 17 03/15/2016 0953   ALT 24 05/15/2018 0331   ALT 26 03/06/2018 1140   ALT 23 03/15/2016 0953   BILITOT 0.6 05/15/2018 0331   BILITOT 0.5 03/06/2018 1140   BILITOT 0.38 03/15/2016 0953       Impression and  Plan: [name] is a very pleasant 57 yo caucasian female with recurrent granulosa cell tumor. She tolerated her first cycle of treatment well with only some nausea and fatigue.  We will continue to monitor her lab work. WBC count today is 1.5, Hgb 12.2 and platelets 60.  She asked if we could reduce her dilaudid to 2 mg PO daily and increase ativan for nausea to 1 mg. I spoke with Dr. Marin Olp and he is ok with these changes.  We also sent in a prescription for magic mouth wash.  She was given fluids today for mild dehydration.  We will plan to see her back in another 2 weeks for cycle to of treatment and MD  visit.  She will contact our office with any questions or concerns. We can certainly see her sooner if need be.   Laverna Peace, NP 11/29/201912:21 PM

## 2018-05-22 NOTE — Progress Notes (Signed)
Per Dr. Marin Olp, I faxed paperwork to [facility]. Fax number is [phone].

## 2018-05-22 NOTE — Telephone Encounter (Signed)
I will need to schedule next tx tomorrow due to [facility] being closed today and I need to coordinate appointments with them per 11/29 los

## 2018-05-22 NOTE — Telephone Encounter (Signed)
LMVM regarding moving lab appointment up to 11/15 instead of 11:30 per 11/27 staff message

## 2018-05-22 NOTE — Patient Instructions (Signed)
Implanted Port Insertion, Care After °This sheet gives you information about how to care for yourself after your procedure. Your health care provider may also give you more specific instructions. If you have problems or questions, contact your health care provider. °What can I expect after the procedure? °After your procedure, it is common to have: °· Discomfort at the port insertion site. °· Bruising on the skin over the port. This should improve over 3-4 days. ° °Follow these instructions at home: °Port care °· After your port is placed, you will get a manufacturer's information card. The card has information about your port. Keep this card with you at all times. °· Take care of the port as told by your health care provider. Ask your health care provider if you or a family member can get training for taking care of the port at home. A home health care nurse may also take care of the port. °· Make sure to remember what type of port you have. °Incision care °· Follow instructions from your health care provider about how to take care of your port insertion site. Make sure you: °? Wash your hands with soap and water before you change your bandage (dressing). If soap and water are not available, use hand sanitizer. °? Change your dressing as told by your health care provider. °? Leave stitches (sutures), skin glue, or adhesive strips in place. These skin closures may need to stay in place for 2 weeks or longer. If adhesive strip edges start to loosen and curl up, you may trim the loose edges. Do not remove adhesive strips completely unless your health care provider tells you to do that. °· Check your port insertion site every day for signs of infection. Check for: °? More redness, swelling, or pain. °? More fluid or blood. °? Warmth. °? Pus or a bad smell. °General instructions °· Do not take baths, swim, or use a hot tub until your health care provider approves. °· Do not lift anything that is heavier than 10 lb (4.5  kg) for a week, or as told by your health care provider. °· Ask your health care provider when it is okay to: °? Return to work or school. °? Resume usual physical activities or sports. °· Do not drive for 24 hours if you were given a medicine to help you relax (sedative). °· Take over-the-counter and prescription medicines only as told by your health care provider. °· Wear a medical alert bracelet in case of an emergency. This will tell any health care providers that you have a port. °· Keep all follow-up visits as told by your health care provider. This is important. °Contact a health care provider if: °· You cannot flush your port with saline as directed, or you cannot draw blood from the port. °· You have a fever or chills. °· You have more redness, swelling, or pain around your port insertion site. °· You have more fluid or blood coming from your port insertion site. °· Your port insertion site feels warm to the touch. °· You have pus or a bad smell coming from the port insertion site. °Get help right away if: °· You have chest pain or shortness of breath. °· You have bleeding from your port that you cannot control. °Summary °· Take care of the port as told by your health care provider. °· Change your dressing as told by your health care provider. °· Keep all follow-up visits as told by your health care provider. °  This information is not intended to replace advice given to you by your health care provider. Make sure you discuss any questions you have with your health care provider. °Document Released: 03/31/2013 Document Revised: 05/01/2016 Document Reviewed: 05/01/2016 °Elsevier Interactive Patient Education © 2017 Elsevier Inc. ° °

## 2018-05-22 NOTE — Patient Instructions (Addendum)
Dehydration, Adult Dehydration is when there is not enough fluid or water in your body. This happens when you lose more fluids than you take in. Dehydration can range from mild to very bad. It should be treated right away to keep it from getting very bad. Symptoms of mild dehydration may include:  Thirst.  Dry lips.  Slightly dry mouth.  Dry, warm skin.  Dizziness. Symptoms of moderate dehydration may include:  Very dry mouth.  Muscle cramps.  Dark pee (urine). Pee may be the color of tea.  Your body making less pee.  Your eyes making fewer tears.  Heartbeat that is uneven or faster than normal (palpitations).  Headache.  Light-headedness, especially when you stand up from sitting.  Fainting (syncope). Symptoms of very bad dehydration may include:  Changes in skin, such as: ? Cold and clammy skin. ? Blotchy (mottled) or pale skin. ? Skin that does not quickly return to normal after being lightly pinched and let go (poor skin turgor).  Changes in body fluids, such as: ? Feeling very thirsty. ? Your eyes making fewer tears. ? Not sweating when body temperature is high, such as in hot weather. ? Your body making very little pee.  Changes in vital signs, such as: ? Weak pulse. ? Pulse that is more than 100 beats a minute when you are sitting still. ? Fast breathing. ? Low blood pressure.  Other changes, such as: ? Sunken eyes. ? Cold hands and feet. ? Confusion. ? Lack of energy (lethargy). ? Trouble waking up from sleep. ? Short-term weight loss. ? Unconsciousness. Follow these instructions at home:  If told by your doctor, drink an ORS: ? Make an ORS by using instructions on the package. ? Start by drinking small amounts, about  cup (120 mL) every 5-10 minutes. ? Slowly drink more until you have had the amount that your doctor said to have.  Drink enough clear fluid to keep your pee clear or pale yellow. If you were told to drink an ORS, finish the ORS  first, then start slowly drinking clear fluids. Drink fluids such as: ? Water. Do not drink only water by itself. Doing that can make the salt (sodium) level in your body get too low (hyponatremia). ? Ice chips. ? Fruit juice that you have added water to (diluted). ? Low-calorie sports drinks.  Avoid: ? Alcohol. ? Drinks that have a lot of sugar. These include high-calorie sports drinks, fruit juice that does not have water added, and soda. ? Caffeine. ? Foods that are greasy or have a lot of fat or sugar.  Take over-the-counter and prescription medicines only as told by your doctor.  Do not take salt tablets. Doing that can make the salt level in your body get too high (hypernatremia).  Eat foods that have minerals (electrolytes). Examples include bananas, oranges, potatoes, tomatoes, and spinach.  Keep all follow-up visits as told by your doctor. This is important. Contact a doctor if:  You have belly (abdominal) pain that: ? Gets worse. ? Stays in one area (localizes).  You have a rash.  You have a stiff neck.  You get angry or annoyed more easily than normal (irritability).  You are more sleepy than normal.  You have a harder time waking up than normal.  You feel: ? Weak. ? Dizzy. ? Very thirsty.  You have peed (urinated) only a small amount of very dark pee during 6-8 hours. Get help right away if:  You have symptoms of   very bad dehydration.  You cannot drink fluids without throwing up (vomiting).  Your symptoms get worse with treatment.  You have a fever.  You have a very bad headache.  You are throwing up or having watery poop (diarrhea) and it: ? Gets worse. ? Does not go away.  You have blood or something green (bile) in your throw-up.  You have blood in your poop (stool). This may cause poop to look black and tarry.  You have not peed in 6-8 hours.  You pass out (faint).  Your heart rate when you are sitting still is more than 100 beats a  minute.  You have trouble breathing. This information is not intended to replace advice given to you by your health care provider. Make sure you discuss any questions you have with your health care provider. Document Released: 04/06/2009 Document Revised: 12/29/2015 Document Reviewed: 08/04/2015 Elsevier Interactive Patient Education  2018 Elsevier Inc.  Dehydration, Adult Dehydration is when there is not enough fluid or water in your body. This happens when you lose more fluids than you take in. Dehydration can range from mild to very bad. It should be treated right away to keep it from getting very bad. Symptoms of mild dehydration may include:  Thirst.  Dry lips.  Slightly dry mouth.  Dry, warm skin.  Dizziness. Symptoms of moderate dehydration may include:  Very dry mouth.  Muscle cramps.  Dark pee (urine). Pee may be the color of tea.  Your body making less pee.  Your eyes making fewer tears.  Heartbeat that is uneven or faster than normal (palpitations).  Headache.  Light-headedness, especially when you stand up from sitting.  Fainting (syncope). Symptoms of very bad dehydration may include:  Changes in skin, such as: ? Cold and clammy skin. ? Blotchy (mottled) or pale skin. ? Skin that does not quickly return to normal after being lightly pinched and let go (poor skin turgor).  Changes in body fluids, such as: ? Feeling very thirsty. ? Your eyes making fewer tears. ? Not sweating when body temperature is high, such as in hot weather. ? Your body making very little pee.  Changes in vital signs, such as: ? Weak pulse. ? Pulse that is more than 100 beats a minute when you are sitting still. ? Fast breathing. ? Low blood pressure.  Other changes, such as: ? Sunken eyes. ? Cold hands and feet. ? Confusion. ? Lack of energy (lethargy). ? Trouble waking up from sleep. ? Short-term weight loss. ? Unconsciousness. Follow these instructions at  home:  If told by your doctor, drink an ORS: ? Make an ORS by using instructions on the package. ? Start by drinking small amounts, about  cup (120 mL) every 5-10 minutes. ? Slowly drink more until you have had the amount that your doctor said to have.  Drink enough clear fluid to keep your pee clear or pale yellow. If you were told to drink an ORS, finish the ORS first, then start slowly drinking clear fluids. Drink fluids such as: ? Water. Do not drink only water by itself. Doing that can make the salt (sodium) level in your body get too low (hyponatremia). ? Ice chips. ? Fruit juice that you have added water to (diluted). ? Low-calorie sports drinks.  Avoid: ? Alcohol. ? Drinks that have a lot of sugar. These include high-calorie sports drinks, fruit juice that does not have water added, and soda. ? Caffeine. ? Foods that are greasy or have   a lot of fat or sugar.  Take over-the-counter and prescription medicines only as told by your doctor.  Do not take salt tablets. Doing that can make the salt level in your body get too high (hypernatremia).  Eat foods that have minerals (electrolytes). Examples include bananas, oranges, potatoes, tomatoes, and spinach.  Keep all follow-up visits as told by your doctor. This is important. Contact a doctor if:  You have belly (abdominal) pain that: ? Gets worse. ? Stays in one area (localizes).  You have a rash.  You have a stiff neck.  You get angry or annoyed more easily than normal (irritability).  You are more sleepy than normal.  You have a harder time waking up than normal.  You feel: ? Weak. ? Dizzy. ? Very thirsty.  You have peed (urinated) only a small amount of very dark pee during 6-8 hours. Get help right away if:  You have symptoms of very bad dehydration.  You cannot drink fluids without throwing up (vomiting).  Your symptoms get worse with treatment.  You have a fever.  You have a very bad headache.  You  are throwing up or having watery poop (diarrhea) and it: ? Gets worse. ? Does not go away.  You have blood or something green (bile) in your throw-up.  You have blood in your poop (stool). This may cause poop to look black and tarry.  You have not peed in 6-8 hours.  You pass out (faint).  Your heart rate when you are sitting still is more than 100 beats a minute.  You have trouble breathing. This information is not intended to replace advice given to you by your health care provider. Make sure you discuss any questions you have with your health care provider. Document Released: 04/06/2009 Document Revised: 12/29/2015 Document Reviewed: 08/04/2015 Elsevier Interactive Patient Education  2018 Elsevier Inc.  

## 2018-05-25 ENCOUNTER — Other Ambulatory Visit: Payer: Self-pay | Admitting: *Deleted

## 2018-05-25 ENCOUNTER — Ambulatory Visit
Admission: RE | Admit: 2018-05-25 | Discharge: 2018-05-25 | Disposition: A | Discharge status: Home / Self Care | Payer: BLUE CROSS/BLUE SHIELD | Source: Ambulatory Visit | Attending: Radiation Oncology | Admitting: Radiation Oncology

## 2018-05-25 ENCOUNTER — Other Ambulatory Visit: Payer: Self-pay | Admitting: Family

## 2018-05-25 ENCOUNTER — Telehealth: Payer: Self-pay | Admitting: Hematology & Oncology

## 2018-05-25 DIAGNOSIS — Z79899 Other long term (current) drug therapy: Secondary | ICD-10-CM | POA: Diagnosis not present

## 2018-05-25 DIAGNOSIS — Z5111 Encounter for antineoplastic chemotherapy: Secondary | ICD-10-CM | POA: Diagnosis not present

## 2018-05-25 DIAGNOSIS — T451X5A Adverse effect of antineoplastic and immunosuppressive drugs, initial encounter: Secondary | ICD-10-CM

## 2018-05-25 DIAGNOSIS — D3911 Neoplasm of uncertain behavior of right ovary: Secondary | ICD-10-CM

## 2018-05-25 DIAGNOSIS — R110 Nausea: Secondary | ICD-10-CM

## 2018-05-25 DIAGNOSIS — D3910 Neoplasm of uncertain behavior of unspecified ovary: Secondary | ICD-10-CM | POA: Diagnosis not present

## 2018-05-25 DIAGNOSIS — Z5189 Encounter for other specified aftercare: Secondary | ICD-10-CM | POA: Diagnosis not present

## 2018-05-25 DIAGNOSIS — K1230 Oral mucositis (ulcerative), unspecified: Secondary | ICD-10-CM

## 2018-05-25 DIAGNOSIS — Z7901 Long term (current) use of anticoagulants: Secondary | ICD-10-CM | POA: Diagnosis not present

## 2018-05-25 DIAGNOSIS — K121 Other forms of stomatitis: Secondary | ICD-10-CM

## 2018-05-25 DIAGNOSIS — C569 Malignant neoplasm of unspecified ovary: Secondary | ICD-10-CM | POA: Diagnosis not present

## 2018-05-25 MED ORDER — MAGIC MOUTHWASH W/LIDOCAINE: 5.0000 mL | Freq: Three times a day (TID) | ORAL | 2 refills | Status: DC | PRN

## 2018-05-25 MED ORDER — LORAZEPAM 1 MG PO TABS: 1.0000 mg | ORAL_TABLET | Freq: Three times a day (TID) | ORAL | 1 refills | Status: DC | PRN

## 2018-05-25 NOTE — Telephone Encounter (Signed)
LMVM with my name/ number for patient to return my call regarding appointments added to her schedule per 11/29 los

## 2018-05-26 ENCOUNTER — Ambulatory Visit
Admission: RE | Admit: 2018-05-26 | Discharge: 2018-05-26 | Disposition: A | Discharge status: Home / Self Care | Payer: BLUE CROSS/BLUE SHIELD | Source: Ambulatory Visit | Attending: Radiation Oncology | Admitting: Radiation Oncology

## 2018-05-26 DIAGNOSIS — C569 Malignant neoplasm of unspecified ovary: Secondary | ICD-10-CM | POA: Diagnosis not present

## 2018-05-26 LAB — INHIBIN B: Inhibin B: 80.2 pg/mL — ABNORMAL HIGH (ref 0.0–16.9)

## 2018-05-27 ENCOUNTER — Ambulatory Visit
Admission: RE | Admit: 2018-05-27 | Discharge: 2018-05-27 | Disposition: A | Discharge status: Home / Self Care | Payer: BLUE CROSS/BLUE SHIELD | Source: Ambulatory Visit | Attending: Radiation Oncology | Admitting: Radiation Oncology

## 2018-05-27 DIAGNOSIS — C569 Malignant neoplasm of unspecified ovary: Secondary | ICD-10-CM | POA: Diagnosis not present

## 2018-05-28 ENCOUNTER — Ambulatory Visit
Admission: RE | Admit: 2018-05-28 | Discharge: 2018-05-28 | Disposition: A | Discharge status: Home / Self Care | Payer: BLUE CROSS/BLUE SHIELD | Source: Ambulatory Visit | Attending: Radiation Oncology | Admitting: Radiation Oncology

## 2018-05-28 DIAGNOSIS — C569 Malignant neoplasm of unspecified ovary: Secondary | ICD-10-CM | POA: Diagnosis not present

## 2018-05-29 ENCOUNTER — Ambulatory Visit
Admission: RE | Admit: 2018-05-29 | Discharge: 2018-05-29 | Disposition: A | Discharge status: Home / Self Care | Payer: BLUE CROSS/BLUE SHIELD | Source: Ambulatory Visit | Attending: Radiation Oncology | Admitting: Radiation Oncology

## 2018-05-29 DIAGNOSIS — C569 Malignant neoplasm of unspecified ovary: Secondary | ICD-10-CM | POA: Diagnosis not present

## 2018-06-01 ENCOUNTER — Encounter: Payer: Self-pay | Admitting: Hematology & Oncology

## 2018-06-01 ENCOUNTER — Ambulatory Visit
Admission: RE | Admit: 2018-06-01 | Discharge: 2018-06-01 | Disposition: A | Discharge status: Home / Self Care | Payer: BLUE CROSS/BLUE SHIELD | Source: Ambulatory Visit | Attending: Radiation Oncology | Admitting: Radiation Oncology

## 2018-06-01 DIAGNOSIS — C569 Malignant neoplasm of unspecified ovary: Secondary | ICD-10-CM | POA: Diagnosis not present

## 2018-06-01 NOTE — Progress Notes (Signed)
This is an pt of Dr. Pearletha Alfred, she is authorized for tx. Thru [employer]    Order ID: [id number] Case Status: [place]: [place]  Valid Dates: 05/18/2018 - 10/12/2018 Start Date: 05/18/2018 J9060 Cisplatin J9181 Etoposide Q5111 Pegfilgrastim-cbqv J2469 Palonosetron HCl J1453 Fosaprepitant J1100 Dexamethasone

## 2018-06-02 ENCOUNTER — Inpatient Hospital Stay: Payer: BLUE CROSS/BLUE SHIELD

## 2018-06-02 ENCOUNTER — Ambulatory Visit
Admission: RE | Admit: 2018-06-02 | Discharge: 2018-06-02 | Disposition: A | Discharge status: Home / Self Care | Payer: BLUE CROSS/BLUE SHIELD | Source: Ambulatory Visit | Attending: Radiation Oncology | Admitting: Radiation Oncology

## 2018-06-02 ENCOUNTER — Telehealth: Payer: Self-pay | Admitting: *Deleted

## 2018-06-02 ENCOUNTER — Inpatient Hospital Stay: Payer: BLUE CROSS/BLUE SHIELD | Attending: Hematology & Oncology

## 2018-06-02 DIAGNOSIS — C569 Malignant neoplasm of unspecified ovary: Secondary | ICD-10-CM | POA: Diagnosis not present

## 2018-06-02 DIAGNOSIS — K121 Other forms of stomatitis: Secondary | ICD-10-CM

## 2018-06-02 DIAGNOSIS — Z7901 Long term (current) use of anticoagulants: Secondary | ICD-10-CM | POA: Insufficient documentation

## 2018-06-02 DIAGNOSIS — Z5189 Encounter for other specified aftercare: Secondary | ICD-10-CM | POA: Insufficient documentation

## 2018-06-02 DIAGNOSIS — R110 Nausea: Secondary | ICD-10-CM

## 2018-06-02 DIAGNOSIS — T451X5A Adverse effect of antineoplastic and immunosuppressive drugs, initial encounter: Secondary | ICD-10-CM

## 2018-06-02 DIAGNOSIS — Z79899 Other long term (current) drug therapy: Secondary | ICD-10-CM | POA: Insufficient documentation

## 2018-06-02 DIAGNOSIS — E860 Dehydration: Secondary | ICD-10-CM

## 2018-06-02 DIAGNOSIS — Z5181 Encounter for therapeutic drug level monitoring: Secondary | ICD-10-CM

## 2018-06-02 DIAGNOSIS — D3910 Neoplasm of uncertain behavior of unspecified ovary: Secondary | ICD-10-CM

## 2018-06-02 DIAGNOSIS — K1230 Oral mucositis (ulcerative), unspecified: Secondary | ICD-10-CM

## 2018-06-02 DIAGNOSIS — Z5111 Encounter for antineoplastic chemotherapy: Secondary | ICD-10-CM | POA: Insufficient documentation

## 2018-06-02 LAB — CBC WITH DIFFERENTIAL (CANCER CENTER ONLY)
Abs Immature Granulocytes: 0.15 10*3/uL — ABNORMAL HIGH (ref 0.00–0.07)
Basophils Absolute: 0 10*3/uL (ref 0.0–0.1)
Basophils Relative: 0 %
Eosinophils Absolute: 0.1 10*3/uL (ref 0.0–0.5)
Eosinophils Relative: 2 %
HCT: 36.5 % (ref 36.0–46.0)
Hemoglobin: 12.4 g/dL (ref 12.0–15.0)
Immature Granulocytes: 3 %
Lymphocytes Relative: 10 %
Lymphs Abs: 0.6 10*3/uL — ABNORMAL LOW (ref 0.7–4.0)
MCH: 30.2 pg (ref 26.0–34.0)
MCHC: 34 g/dL (ref 30.0–36.0)
MCV: 88.8 fL (ref 80.0–100.0)
Monocytes Absolute: 0.8 10*3/uL (ref 0.1–1.0)
Monocytes Relative: 14 %
Neutro Abs: 3.8 10*3/uL (ref 1.7–7.7)
Neutrophils Relative %: 71 %
Platelet Count: 235 10*3/uL (ref 150–400)
RBC: 4.11 MIL/uL (ref 3.87–5.11)
RDW: 14.8 % (ref 11.5–15.5)
WBC Count: 5.5 10*3/uL (ref 4.0–10.5)
nRBC: 0.4 % — ABNORMAL HIGH (ref 0.0–0.2)

## 2018-06-02 LAB — CMP (CANCER CENTER ONLY)
ALT: 22 U/L (ref 0–44)
AST: 17 U/L (ref 15–41)
Albumin: 3.4 g/dL — ABNORMAL LOW (ref 3.5–5.0)
Alkaline Phosphatase: 75 U/L (ref 38–126)
Anion gap: 12 (ref 5–15)
BUN: 12 mg/dL (ref 6–20)
CHLORIDE: 97 mmol/L — AB (ref 98–111)
CO2: 29 mmol/L (ref 22–32)
Calcium: 8.9 mg/dL (ref 8.9–10.3)
Creatinine: 1.33 mg/dL — ABNORMAL HIGH (ref 0.44–1.00)
GFR, Est AFR Am: 51 mL/min — ABNORMAL LOW (ref >60)
GFR, Estimated: 44 mL/min — ABNORMAL LOW (ref >60)
Glucose, Bld: 86 mg/dL (ref 70–99)
Potassium: 3.3 mmol/L — ABNORMAL LOW (ref 3.5–5.1)
Sodium: 138 mmol/L (ref 135–145)
Total Bilirubin: 0.3 mg/dL (ref 0.3–1.2)
Total Protein: 6.8 g/dL (ref 6.5–8.1)

## 2018-06-02 LAB — PROTIME-INR
INR: 2.4
Prothrombin Time: 25.8 seconds — ABNORMAL HIGH (ref 11.4–15.2)

## 2018-06-02 NOTE — Telephone Encounter (Signed)
Returned the patient's call from yesterday regarding her lab/flush for today. Explained to the patient "appts were scheduled at the [facility] office due to her having radiation here. Appts for her treatment tomorrow." Patient verbalized understanding

## 2018-06-03 ENCOUNTER — Ambulatory Visit
Admission: RE | Admit: 2018-06-03 | Discharge: 2018-06-03 | Disposition: A | Discharge status: Home / Self Care | Payer: BLUE CROSS/BLUE SHIELD | Source: Ambulatory Visit | Attending: Radiation Oncology | Admitting: Radiation Oncology

## 2018-06-03 ENCOUNTER — Inpatient Hospital Stay: Payer: BLUE CROSS/BLUE SHIELD

## 2018-06-03 ENCOUNTER — Encounter: Payer: Self-pay | Admitting: Hematology & Oncology

## 2018-06-03 ENCOUNTER — Other Ambulatory Visit: Payer: Self-pay

## 2018-06-03 ENCOUNTER — Inpatient Hospital Stay (HOSPITAL_BASED_OUTPATIENT_CLINIC_OR_DEPARTMENT_OTHER): Payer: BLUE CROSS/BLUE SHIELD | Admitting: Hematology & Oncology

## 2018-06-03 ENCOUNTER — Other Ambulatory Visit: Payer: BLUE CROSS/BLUE SHIELD

## 2018-06-03 VITALS — BP 91/63 | HR 88 | Temp 97.8°F | Resp 20 | Wt 191.4 lb

## 2018-06-03 DIAGNOSIS — D3910 Neoplasm of uncertain behavior of unspecified ovary: Secondary | ICD-10-CM

## 2018-06-03 DIAGNOSIS — C569 Malignant neoplasm of unspecified ovary: Secondary | ICD-10-CM | POA: Diagnosis not present

## 2018-06-03 MED ORDER — PALONOSETRON HCL INJECTION 0.25 MG/5ML
0.2500 mg | Freq: Once | INTRAVENOUS | Status: AC
  Administered 2018-06-03: 0.25 mg via INTRAVENOUS

## 2018-06-03 MED ORDER — SODIUM CHLORIDE 0.9 % IV SOLN
49.0000 mg/m2 | Freq: Once | INTRAVENOUS | Status: AC
  Administered 2018-06-03: 100 mg via INTRAVENOUS
  Filled 2018-06-03: qty 100

## 2018-06-03 MED ORDER — SODIUM CHLORIDE 0.9 % IV SOLN
90.0000 mg/m2 | Freq: Once | INTRAVENOUS | Status: AC
  Administered 2018-06-03: 180 mg via INTRAVENOUS
  Filled 2018-06-03: qty 9

## 2018-06-03 MED ORDER — HEPARIN SOD (PORK) LOCK FLUSH 100 UNIT/ML IV SOLN
500.0000 [IU] | Freq: Once | INTRAVENOUS | Status: AC | PRN
  Administered 2018-06-03: 500 [IU]
  Filled 2018-06-03: qty 5

## 2018-06-03 MED ORDER — POTASSIUM CHLORIDE 2 MEQ/ML IV SOLN
Freq: Once | INTRAVENOUS | Status: AC
  Administered 2018-06-03: 09:00:00 via INTRAVENOUS
  Filled 2018-06-03: qty 10

## 2018-06-03 MED ORDER — PALONOSETRON HCL INJECTION 0.25 MG/5ML
INTRAVENOUS | Status: AC
  Filled 2018-06-03: qty 5

## 2018-06-03 MED ORDER — SODIUM CHLORIDE 0.9 % IV SOLN
Freq: Once | INTRAVENOUS | Status: AC
  Administered 2018-06-03: 11:00:00 via INTRAVENOUS
  Filled 2018-06-03: qty 5

## 2018-06-03 MED ORDER — SODIUM CHLORIDE 0.9 % IV SOLN
Freq: Once | INTRAVENOUS | Status: AC
  Administered 2018-06-03: 09:00:00 via INTRAVENOUS
  Filled 2018-06-03: qty 250

## 2018-06-03 MED ORDER — SODIUM CHLORIDE 0.9% FLUSH
10.0000 mL | INTRAVENOUS | Status: DC | PRN
  Administered 2018-06-03: 10 mL
  Filled 2018-06-03: qty 10

## 2018-06-03 NOTE — Progress Notes (Signed)
Hematology and Oncology Follow Up Visit  [name] [id number] 11/05/1960 57 y.o. 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  She really looks much better.  It is apparent that radiation along with radiosensitizing chemotherapy is working.  Her Inhibin-B level has gone from 1600 down to 84.  She is really not having much in the way of abdominal pain.  She is eating.  She is having no vomiting.  There is no problems with diarrhea.  She has had no bleeding.  There is no fever.  There is been no cough or shortness of breath.  She has had no rashes.  There has been no leg swelling.  She has had no headache.  There is been no mouth sores.  Overall, her performance status is ECOG 1.    Medications:  Allergies as of 06/03/2018      Reactions   Prochlorperazine Edisylate Other (See Comments)   Nervous/ flutter/ shakes--compazine   Adhesive [tape] Other (See Comments)   Redness/skin peeling Redness/hives from adhesive tape( tolerates latex gloves); tolerates paper tape   Prochlorperazine Other (See Comments)   Jitters, hyper      Medication List        Accurate as of 06/03/18  9:29 AM. Always use your most recent med list.          aspirin 81 MG EC tablet Take 1 tablet (81 mg total) by mouth daily.   carvedilol 3.125 MG tablet Commonly known as:  COREG Take 1 tablet (3.125 mg total) by mouth 2 (two) times daily with a meal.   furosemide 20 MG tablet Commonly known as:  LASIX TAKE 1 TAB BY MOUTH DAILY. MAY TAKE AN EXTRA TAB AS NEEDED FOR LEG SWELLING OR SHORTNESS OF BREATH   gabapentin 300 MG capsule Commonly known as:  NEURONTIN Take 1 capsule (300 mg total) by mouth 3 (three) times daily.   HYDROmorphone 2 MG tablet Commonly known as:  DILAUDID Take 1 tablet (2 mg total) by  mouth every 4 (four) hours as needed for severe pain.   lidocaine-prilocaine cream Commonly known as:  EMLA Apply 1 application topically as needed. Apply to port per instructions   LORazepam 1 MG tablet Commonly known as:  ATIVAN Take 1 tablet (1 mg total) by mouth every 8 (eight) hours as needed (for nausea and vomiting).   magic mouthwash w/lidocaine Soln Take 5 mLs by mouth 3 (three) times daily as needed for mouth pain.   methocarbamol 500 MG tablet Commonly known as:  ROBAXIN Take 1 tablet (500 mg total) by mouth every 6 (six) hours as needed for muscle spasms.   oxyCODONE 20 mg 12 hr tablet Commonly known as:  OXYCONTIN Take 1 tablet (20 mg total) by mouth every 12 (twelve) hours.   spironolactone 25 MG tablet Commonly known as:  ALDACTONE Take 1 tablet (25 mg total) by mouth daily.   warfarin 7.5 MG tablet Commonly known as:  COUMADIN Take as directed by the anticoagulation clinic. If you are unsure how to take this medication, talk to your nurse or doctor. Original instructions:  Take 1 tablet (7.5 mg total) by mouth daily at 6 PM.   zolpidem 5 MG tablet Commonly known as:  AMBIEN TAKE 1 TABLET BY MOUTH EVERYDAY AT BEDTIME  Allergies:  Allergies  Allergen Reactions  . Prochlorperazine Edisylate Other (See Comments)    Nervous/ flutter/ shakes--compazine  . Adhesive [Tape] Other (See Comments)    Redness/skin peeling Redness/hives from adhesive tape( tolerates latex gloves); tolerates paper tape  . Prochlorperazine Other (See Comments)    Jitters, hyper    Past Medical History, Surgical history, Social history, and Family History were reviewed and updated.  Review of Systems:  Review of Systems  Constitutional: Negative.   HENT: Negative.   Eyes: Negative.   Respiratory: Negative.   Cardiovascular: Negative.   Gastrointestinal: Negative.   Genitourinary: Negative.   Musculoskeletal: Negative.   Skin: Negative.   Neurological: Negative.     Endo/Heme/Allergies: Negative.   Psychiatric/Behavioral: Negative.       Exam:  weight is 191 lb 6.4 oz (86.8 kg). Her oral temperature is 97.8 F (36.6 C). Her blood pressure is 91/63 and her pulse is 88. Her respiration is 20 and oxygen saturation is 98%.   Wt Readings from Last 3 Encounters:  06/03/18 191 lb 6.4 oz (86.8 kg)  05/19/18 188 lb 9 oz (85.5 kg)  05/04/18 197 lb (89.4 kg)    Physical Exam Vitals signs reviewed.  HENT:     Head: Normocephalic and atraumatic.  Eyes:     Pupils: Pupils are equal, round, and reactive to light.  Neck:     Musculoskeletal: Normal range of motion.  Cardiovascular:     Rate and Rhythm: Normal rate and regular rhythm.     Heart sounds: Normal heart sounds.  Pulmonary:     Effort: Pulmonary effort is normal.     Breath sounds: Normal breath sounds.  Abdominal:     General: Bowel sounds are normal.     Palpations: Abdomen is soft.  Musculoskeletal: Normal range of motion.        General: No tenderness or deformity.  Lymphadenopathy:     Cervical: No cervical adenopathy.  Skin:    General: Skin is warm and dry.     Findings: No erythema or rash.  Neurological:     Mental Status: She is alert and oriented to person, place, and time.  Psychiatric:        Behavior: Behavior normal.        Thought Content: Thought content normal.        Judgment: Judgment normal.      Lab Results  Component Value Date   WBC 5.5 06/02/2018   HGB 12.4 06/02/2018   HCT 36.5 06/02/2018   MCV 88.8 06/02/2018   PLT 235 06/02/2018   Lab Results  Component Value Date   FERRITIN 211 05/22/2018   IRON 101 05/22/2018   TIBC 245 05/22/2018   UIBC 145 05/22/2018   IRONPCTSAT 41 05/22/2018   Lab Results  Component Value Date   RBC 4.11 06/02/2018   No results found for: KPAFRELGTCHN, LAMBDASER, KAPLAMBRATIO No results found for: IGGSERUM, IGA, IGMSERUM No results found for: Odetta Pink,  SPEI   Chemistry      Component Value Date/Time   NA 138 06/02/2018 1503   NA 140 07/21/2017 1120   NA 140 03/15/2016 0953   K 3.3 (L) 06/02/2018 1503   K 3.9 03/15/2016 0953   CL 97 (L) 06/02/2018 1503   CL 102 12/13/2015 0803   CO2 29 06/02/2018 1503   CO2 19 (L) 03/15/2016 0953   BUN 12 06/02/2018 1503   BUN 13 07/21/2017 1120   BUN  15.9 03/15/2016 0953   CREATININE 1.33 (H) 06/02/2018 1503   CREATININE 1.4 (H) 03/15/2016 0953      Component Value Date/Time   CALCIUM 8.9 06/02/2018 1503   CALCIUM 9.3 03/15/2016 0953   ALKPHOS 75 06/02/2018 1503   ALKPHOS 55 03/15/2016 0953   AST 17 06/02/2018 1503   AST 17 03/15/2016 0953   ALT 22 06/02/2018 1503   ALT 23 03/15/2016 0953   BILITOT 0.3 06/02/2018 1503   BILITOT 0.38 03/15/2016 0953       Impression and Plan: Ms. Melle is a very pleasant 57 yo caucasian female with recurrent granulosa cell tumor.  I am very happy that she is doing so well.  She has responded.  The inhibin B is our indicator that she is responding.  She is eating.  She is not losing weight.  She will finish up radiation next Friday.  I probably would not do any scans on her for at least 6 weeks after radiation is completed.  I would like to see her back in about 3 weeks or so.  I think we can actually get her back after New Year's.  She will get her second and final cycle of chemotherapy starting today.    Volanda Napoleon, MD 12/11/20199:29 AM

## 2018-06-03 NOTE — Patient Instructions (Signed)
[facility] Discharge Instructions for Patients Receiving Chemotherapy  Today you received the following chemotherapy agents Cisplatin/VP 16 To help prevent nausea and vomiting after your treatment, we encourage you to take your nausea medication as prescribed.   If you develop nausea and vomiting that is not controlled by your nausea medication, call the clinic.   BELOW ARE SYMPTOMS THAT SHOULD BE REPORTED IMMEDIATELY:  *FEVER GREATER THAN 100.5 F  *CHILLS WITH OR WITHOUT FEVER  NAUSEA AND VOMITING THAT IS NOT CONTROLLED WITH YOUR NAUSEA MEDICATION  *UNUSUAL SHORTNESS OF BREATH  *UNUSUAL BRUISING OR BLEEDING  TENDERNESS IN MOUTH AND THROAT WITH OR WITHOUT PRESENCE OF ULCERS  *URINARY PROBLEMS  *BOWEL PROBLEMS  UNUSUAL RASH Items with * indicate a potential emergency and should be followed up as soon as possible.  Feel free to call the clinic should you have any questions or concerns. The clinic phone number is [phone].  Please show the [place] at check-in to the Emergency Department and triage nurse.

## 2018-06-04 ENCOUNTER — Ambulatory Visit
Admission: RE | Admit: 2018-06-04 | Discharge: 2018-06-04 | Disposition: A | Discharge status: Home / Self Care | Payer: BLUE CROSS/BLUE SHIELD | Source: Ambulatory Visit | Attending: Radiation Oncology | Admitting: Radiation Oncology

## 2018-06-04 ENCOUNTER — Other Ambulatory Visit: Payer: BLUE CROSS/BLUE SHIELD

## 2018-06-04 ENCOUNTER — Ambulatory Visit: Payer: BLUE CROSS/BLUE SHIELD

## 2018-06-04 ENCOUNTER — Inpatient Hospital Stay: Payer: BLUE CROSS/BLUE SHIELD

## 2018-06-04 ENCOUNTER — Telehealth: Payer: Self-pay | Admitting: Hematology & Oncology

## 2018-06-04 VITALS — BP 97/51 | HR 70 | Temp 97.7°F | Resp 18

## 2018-06-04 DIAGNOSIS — D3910 Neoplasm of uncertain behavior of unspecified ovary: Secondary | ICD-10-CM

## 2018-06-04 DIAGNOSIS — C569 Malignant neoplasm of unspecified ovary: Secondary | ICD-10-CM | POA: Diagnosis not present

## 2018-06-04 LAB — INHIBIN B: Inhibin B: 44.6 pg/mL — ABNORMAL HIGH (ref 0.0–16.9)

## 2018-06-04 MED ORDER — FAMOTIDINE IN NACL 20-0.9 MG/50ML-% IV SOLN
INTRAVENOUS | Status: AC
  Filled 2018-06-04: qty 50

## 2018-06-04 MED ORDER — SODIUM CHLORIDE 0.9 % IV SOLN
Freq: Once | INTRAVENOUS | Status: AC
  Administered 2018-06-04: 10:00:00 via INTRAVENOUS
  Filled 2018-06-04: qty 250

## 2018-06-04 MED ORDER — HEPARIN SOD (PORK) LOCK FLUSH 100 UNIT/ML IV SOLN
500.0000 [IU] | Freq: Once | INTRAVENOUS | Status: AC
  Administered 2018-06-04: 500 [IU] via INTRAVENOUS
  Filled 2018-06-04: qty 5

## 2018-06-04 MED ORDER — SODIUM CHLORIDE 0.9 % IV SOLN: 10.0000 mg | Freq: Once | INTRAVENOUS | Status: DC

## 2018-06-04 MED ORDER — DEXAMETHASONE SODIUM PHOSPHATE 10 MG/ML IJ SOLN
10.0000 mg | Freq: Once | INTRAMUSCULAR | Status: AC
  Administered 2018-06-04: 10 mg via INTRAVENOUS

## 2018-06-04 MED ORDER — SODIUM CHLORIDE 0.9 % IV SOLN
90.0000 mg/m2 | Freq: Once | INTRAVENOUS | Status: AC
  Administered 2018-06-04: 180 mg via INTRAVENOUS
  Filled 2018-06-04: qty 9

## 2018-06-04 MED ORDER — FAMOTIDINE IN NACL 20-0.9 MG/50ML-% IV SOLN
20.0000 mg | Freq: Once | INTRAVENOUS | Status: AC
  Administered 2018-06-04: 20 mg via INTRAVENOUS

## 2018-06-04 MED ORDER — DEXAMETHASONE SODIUM PHOSPHATE 10 MG/ML IJ SOLN
INTRAMUSCULAR | Status: AC
  Filled 2018-06-04: qty 1

## 2018-06-04 MED ORDER — SODIUM CHLORIDE 0.9% FLUSH
10.0000 mL | INTRAVENOUS | Status: DC | PRN
  Administered 2018-06-04: 10 mL via INTRAVENOUS
  Filled 2018-06-04: qty 10

## 2018-06-04 NOTE — Progress Notes (Signed)
Patient complains of heartburn since last night. Patient states she took an "acid reducer" this morning and Tums last night with little relief. Laverna Peace, NP notified. Order given and carried out for Famotidine 20 mg IVPB per Laverna Peace, NP. Patient verbalized understanding.

## 2018-06-04 NOTE — Patient Instructions (Signed)
[facility] Discharge Instructions for Patients Receiving Chemotherapy  Today you received the following chemotherapy agents:  Etoposide  To help prevent nausea and vomiting after your treatment, we encourage you to take your nausea medication as ordered per MD.    If you develop nausea and vomiting that is not controlled by your nausea medication, call the clinic.   BELOW ARE SYMPTOMS THAT SHOULD BE REPORTED IMMEDIATELY:  *FEVER GREATER THAN 100.5 F  *CHILLS WITH OR WITHOUT FEVER  NAUSEA AND VOMITING THAT IS NOT CONTROLLED WITH YOUR NAUSEA MEDICATION  *UNUSUAL SHORTNESS OF BREATH  *UNUSUAL BRUISING OR BLEEDING  TENDERNESS IN MOUTH AND THROAT WITH OR WITHOUT PRESENCE OF ULCERS  *URINARY PROBLEMS  *BOWEL PROBLEMS  UNUSUAL RASH Items with * indicate a potential emergency and should be followed up as soon as possible.  Feel free to call the clinic should you have any questions or concerns. The clinic phone number is [phone].  Please show the CHEMO ALERT CARD at check-in to the Emergency Department and triage nurse.   

## 2018-06-04 NOTE — Progress Notes (Deleted)
Dr. Marin Olp notified of pt.'s complaints of heartburn.  Order received to give pt Pepcid 20 mg IV now prior to IV Decadron.

## 2018-06-04 NOTE — Telephone Encounter (Signed)
Appointments scheduled letter/calendar mailed per 12/11 los

## 2018-06-05 ENCOUNTER — Ambulatory Visit: Payer: BLUE CROSS/BLUE SHIELD

## 2018-06-05 ENCOUNTER — Inpatient Hospital Stay: Payer: BLUE CROSS/BLUE SHIELD

## 2018-06-05 ENCOUNTER — Ambulatory Visit
Admission: RE | Admit: 2018-06-05 | Discharge: 2018-06-05 | Disposition: A | Discharge status: Home / Self Care | Payer: BLUE CROSS/BLUE SHIELD | Source: Ambulatory Visit | Attending: Radiation Oncology | Admitting: Radiation Oncology

## 2018-06-05 ENCOUNTER — Other Ambulatory Visit: Payer: Self-pay

## 2018-06-05 VITALS — BP 104/52 | HR 73 | Temp 97.9°F | Resp 18

## 2018-06-05 DIAGNOSIS — D3910 Neoplasm of uncertain behavior of unspecified ovary: Secondary | ICD-10-CM

## 2018-06-05 DIAGNOSIS — C569 Malignant neoplasm of unspecified ovary: Secondary | ICD-10-CM | POA: Diagnosis not present

## 2018-06-05 MED ORDER — SODIUM CHLORIDE 0.9 % IV SOLN
90.0000 mg/m2 | Freq: Once | INTRAVENOUS | Status: AC
  Administered 2018-06-05: 180 mg via INTRAVENOUS
  Filled 2018-06-05: qty 9

## 2018-06-05 MED ORDER — ONDANSETRON HCL 8 MG PO TABS: 8.0000 mg | ORAL_TABLET | Freq: Two times a day (BID) | ORAL | 3 refills | Status: DC | PRN

## 2018-06-05 MED ORDER — HEPARIN SOD (PORK) LOCK FLUSH 100 UNIT/ML IV SOLN
500.0000 [IU] | Freq: Once | INTRAVENOUS | Status: AC | PRN
  Administered 2018-06-05: 500 [IU]
  Filled 2018-06-05: qty 5

## 2018-06-05 MED ORDER — SODIUM CHLORIDE 0.9 % IV SOLN
Freq: Once | INTRAVENOUS | Status: AC
  Administered 2018-06-05: 11:00:00 via INTRAVENOUS
  Filled 2018-06-05: qty 250

## 2018-06-05 MED ORDER — DEXAMETHASONE SODIUM PHOSPHATE 10 MG/ML IJ SOLN
10.0000 mg | Freq: Once | INTRAMUSCULAR | Status: AC
  Administered 2018-06-05: 10 mg via INTRAVENOUS

## 2018-06-05 MED ORDER — OLANZAPINE 10 MG PO TABS: 10.0000 mg | ORAL_TABLET | Freq: Every day | ORAL | 3 refills | Status: DC

## 2018-06-05 MED ORDER — DEXAMETHASONE SODIUM PHOSPHATE 10 MG/ML IJ SOLN
INTRAMUSCULAR | Status: AC
  Filled 2018-06-05: qty 1

## 2018-06-05 MED ORDER — SODIUM CHLORIDE 0.9% FLUSH
10.0000 mL | INTRAVENOUS | Status: DC | PRN
  Administered 2018-06-05: 10 mL
  Filled 2018-06-05: qty 10

## 2018-06-05 NOTE — Patient Instructions (Signed)
[facility] Discharge Instructions for Patients Receiving Chemotherapy  Today you received the following chemotherapy agents:  Etoposide  To help prevent nausea and vomiting after your treatment, we encourage you to take your nausea medication as ordered per MD.    If you develop nausea and vomiting that is not controlled by your nausea medication, call the clinic.   BELOW ARE SYMPTOMS THAT SHOULD BE REPORTED IMMEDIATELY:  *FEVER GREATER THAN 100.5 F  *CHILLS WITH OR WITHOUT FEVER  NAUSEA AND VOMITING THAT IS NOT CONTROLLED WITH YOUR NAUSEA MEDICATION  *UNUSUAL SHORTNESS OF BREATH  *UNUSUAL BRUISING OR BLEEDING  TENDERNESS IN MOUTH AND THROAT WITH OR WITHOUT PRESENCE OF ULCERS  *URINARY PROBLEMS  *BOWEL PROBLEMS  UNUSUAL RASH Items with * indicate a potential emergency and should be followed up as soon as possible.  Feel free to call the clinic should you have any questions or concerns. The clinic phone number is [phone].  Please show the CHEMO ALERT CARD at check-in to the Emergency Department and triage nurse.   

## 2018-06-06 ENCOUNTER — Other Ambulatory Visit (HOSPITAL_COMMUNITY): Payer: Self-pay | Admitting: Hematology & Oncology

## 2018-06-06 DIAGNOSIS — D3910 Neoplasm of uncertain behavior of unspecified ovary: Secondary | ICD-10-CM

## 2018-06-08 ENCOUNTER — Ambulatory Visit
Admission: RE | Admit: 2018-06-08 | Discharge: 2018-06-08 | Disposition: A | Discharge status: Home / Self Care | Payer: BLUE CROSS/BLUE SHIELD | Source: Ambulatory Visit | Attending: Radiation Oncology | Admitting: Radiation Oncology

## 2018-06-08 ENCOUNTER — Inpatient Hospital Stay: Payer: BLUE CROSS/BLUE SHIELD

## 2018-06-08 DIAGNOSIS — C569 Malignant neoplasm of unspecified ovary: Secondary | ICD-10-CM | POA: Diagnosis not present

## 2018-06-08 DIAGNOSIS — D3910 Neoplasm of uncertain behavior of unspecified ovary: Secondary | ICD-10-CM

## 2018-06-08 MED ORDER — PEGFILGRASTIM-CBQV 6 MG/0.6ML ~~LOC~~ SOSY
6.0000 mg | PREFILLED_SYRINGE | Freq: Once | SUBCUTANEOUS | Status: AC
  Administered 2018-06-08: 6 mg via SUBCUTANEOUS

## 2018-06-08 NOTE — Patient Instructions (Signed)
Pegfilgrastim injection What is this medicine? PEGFILGRASTIM (PEG fil gra stim) is a long-acting granulocyte colony-stimulating factor that stimulates the growth of neutrophils, a type of white blood cell important in the body's fight against infection. It is used to reduce the incidence of fever and infection in patients with certain types of cancer who are receiving chemotherapy that affects the bone marrow, and to increase survival after being exposed to high doses of radiation. This medicine may be used for other purposes; ask your health care provider or pharmacist if you have questions. COMMON BRAND NAME(S): Neulasta What should I tell my health care provider before I take this medicine? They need to know if you have any of these conditions: -kidney disease -latex allergy -ongoing radiation therapy -sickle cell disease -skin reactions to acrylic adhesives (On-Body Injector only) -an unusual or allergic reaction to pegfilgrastim, filgrastim, other medicines, foods, dyes, or preservatives -pregnant or trying to get pregnant -breast-feeding How should I use this medicine? This medicine is for injection under the skin. If you get this medicine at home, you will be taught how to prepare and give the pre-filled syringe or how to use the On-body Injector. Refer to the patient Instructions for Use for detailed instructions. Use exactly as directed. Tell your healthcare provider immediately if you suspect that the On-body Injector may not have performed as intended or if you suspect the use of the On-body Injector resulted in a missed or partial dose. It is important that you put your used needles and syringes in a special sharps container. Do not put them in a trash can. If you do not have a sharps container, call your pharmacist or healthcare provider to get one. Talk to your pediatrician regarding the use of this medicine in children. While this drug may be prescribed for selected conditions,  precautions do apply. Overdosage: If you think you have taken too much of this medicine contact a poison control center or emergency room at once. NOTE: This medicine is only for you. Do not share this medicine with others. What if I miss a dose? It is important not to miss your dose. Call your doctor or health care professional if you miss your dose. If you miss a dose due to an On-body Injector failure or leakage, a new dose should be administered as soon as possible using a single prefilled syringe for manual use. What may interact with this medicine? Interactions have not been studied. Give your health care provider a list of all the medicines, herbs, non-prescription drugs, or dietary supplements you use. Also tell them if you smoke, drink alcohol, or use illegal drugs. Some items may interact with your medicine. This list may not describe all possible interactions. Give your health care provider a list of all the medicines, herbs, non-prescription drugs, or dietary supplements you use. Also tell them if you smoke, drink alcohol, or use illegal drugs. Some items may interact with your medicine. What should I watch for while using this medicine? You may need blood work done while you are taking this medicine. If you are going to need a MRI, CT scan, or other procedure, tell your doctor that you are using this medicine (On-Body Injector only). What side effects may I notice from receiving this medicine? Side effects that you should report to your doctor or health care professional as soon as possible: -allergic reactions like skin rash, itching or hives, swelling of the face, lips, or tongue -dizziness -fever -pain, redness, or irritation at site   where injected -pinpoint red spots on the skin -red or dark-brown urine -shortness of breath or breathing problems -stomach or side pain, or pain at the shoulder -swelling -tiredness -trouble passing urine or change in the amount of urine Side  effects that usually do not require medical attention (report to your doctor or health care professional if they continue or are bothersome): -bone pain -muscle pain This list may not describe all possible side effects. Call your doctor for medical advice about side effects. You may report side effects to FDA at 1-800-FDA-1088. Where should I keep my medicine? Keep out of the reach of children. Store pre-filled syringes in a refrigerator between 2 and 8 degrees C (36 and 46 degrees F). Do not freeze. Keep in carton to protect from light. Throw away this medicine if it is left out of the refrigerator for more than 48 hours. Throw away any unused medicine after the expiration date. NOTE: This sheet is a summary. It may not cover all possible information. If you have questions about this medicine, talk to your doctor, pharmacist, or health care provider.  2018 [employer]vier/Gold Standard (2016-06-06 12:58:03)  

## 2018-06-09 ENCOUNTER — Telehealth: Payer: Self-pay

## 2018-06-09 ENCOUNTER — Emergency Department (HOSPITAL_COMMUNITY): Payer: BLUE CROSS/BLUE SHIELD

## 2018-06-09 ENCOUNTER — Ambulatory Visit
Admission: RE | Admit: 2018-06-09 | Discharge: 2018-06-09 | Disposition: A | Discharge status: Home / Self Care | Payer: BLUE CROSS/BLUE SHIELD | Source: Ambulatory Visit | Attending: Radiation Oncology | Admitting: Radiation Oncology

## 2018-06-09 ENCOUNTER — Observation Stay (HOSPITAL_COMMUNITY)
Admission: EM | Admit: 2018-06-09 | Discharge: 2018-06-11 | Disposition: A | Discharge status: Home / Self Care | Payer: BLUE CROSS/BLUE SHIELD | Attending: Family Medicine | Admitting: Family Medicine

## 2018-06-09 ENCOUNTER — Other Ambulatory Visit: Payer: Self-pay

## 2018-06-09 ENCOUNTER — Encounter (HOSPITAL_COMMUNITY): Payer: Self-pay | Admitting: Emergency Medicine

## 2018-06-09 DIAGNOSIS — Z79891 Long term (current) use of opiate analgesic: Secondary | ICD-10-CM | POA: Diagnosis not present

## 2018-06-09 DIAGNOSIS — D72829 Elevated white blood cell count, unspecified: Secondary | ICD-10-CM | POA: Diagnosis present

## 2018-06-09 DIAGNOSIS — Z95810 Presence of automatic (implantable) cardiac defibrillator: Secondary | ICD-10-CM | POA: Diagnosis not present

## 2018-06-09 DIAGNOSIS — N183 Chronic kidney disease, stage 3 (moderate): Secondary | ICD-10-CM | POA: Insufficient documentation

## 2018-06-09 DIAGNOSIS — Z7901 Long term (current) use of anticoagulants: Secondary | ICD-10-CM

## 2018-06-09 DIAGNOSIS — N179 Acute kidney failure, unspecified: Secondary | ICD-10-CM | POA: Insufficient documentation

## 2018-06-09 DIAGNOSIS — I252 Old myocardial infarction: Secondary | ICD-10-CM | POA: Insufficient documentation

## 2018-06-09 DIAGNOSIS — R531 Weakness: Secondary | ICD-10-CM | POA: Diagnosis present

## 2018-06-09 DIAGNOSIS — R197 Diarrhea, unspecified: Secondary | ICD-10-CM | POA: Insufficient documentation

## 2018-06-09 DIAGNOSIS — I471 Supraventricular tachycardia: Secondary | ICD-10-CM | POA: Diagnosis not present

## 2018-06-09 DIAGNOSIS — Z8551 Personal history of malignant neoplasm of bladder: Secondary | ICD-10-CM | POA: Diagnosis not present

## 2018-06-09 DIAGNOSIS — E876 Hypokalemia: Secondary | ICD-10-CM | POA: Diagnosis not present

## 2018-06-09 DIAGNOSIS — I480 Paroxysmal atrial fibrillation: Secondary | ICD-10-CM | POA: Insufficient documentation

## 2018-06-09 DIAGNOSIS — C569 Malignant neoplasm of unspecified ovary: Secondary | ICD-10-CM | POA: Insufficient documentation

## 2018-06-09 DIAGNOSIS — E039 Hypothyroidism, unspecified: Secondary | ICD-10-CM | POA: Insufficient documentation

## 2018-06-09 DIAGNOSIS — E860 Dehydration: Secondary | ICD-10-CM | POA: Diagnosis present

## 2018-06-09 DIAGNOSIS — R5381 Other malaise: Secondary | ICD-10-CM | POA: Diagnosis present

## 2018-06-09 DIAGNOSIS — I82409 Acute embolism and thrombosis of unspecified deep veins of unspecified lower extremity: Secondary | ICD-10-CM | POA: Diagnosis not present

## 2018-06-09 DIAGNOSIS — D3910 Neoplasm of uncertain behavior of unspecified ovary: Secondary | ICD-10-CM

## 2018-06-09 DIAGNOSIS — Z952 Presence of prosthetic heart valve: Secondary | ICD-10-CM

## 2018-06-09 DIAGNOSIS — I2510 Atherosclerotic heart disease of native coronary artery without angina pectoris: Secondary | ICD-10-CM | POA: Diagnosis not present

## 2018-06-09 DIAGNOSIS — E878 Other disorders of electrolyte and fluid balance, not elsewhere classified: Secondary | ICD-10-CM | POA: Diagnosis not present

## 2018-06-09 DIAGNOSIS — Z79899 Other long term (current) drug therapy: Secondary | ICD-10-CM | POA: Diagnosis not present

## 2018-06-09 DIAGNOSIS — R42 Dizziness and giddiness: Secondary | ICD-10-CM | POA: Diagnosis present

## 2018-06-09 DIAGNOSIS — D6481 Anemia due to antineoplastic chemotherapy: Secondary | ICD-10-CM | POA: Diagnosis not present

## 2018-06-09 DIAGNOSIS — Z7982 Long term (current) use of aspirin: Secondary | ICD-10-CM | POA: Diagnosis not present

## 2018-06-09 DIAGNOSIS — T451X5A Adverse effect of antineoplastic and immunosuppressive drugs, initial encounter: Secondary | ICD-10-CM

## 2018-06-09 DIAGNOSIS — I5022 Chronic systolic (congestive) heart failure: Secondary | ICD-10-CM | POA: Diagnosis not present

## 2018-06-09 DIAGNOSIS — D72828 Other elevated white blood cell count: Secondary | ICD-10-CM

## 2018-06-09 LAB — CMP (CANCER CENTER ONLY)
ALT: 25 U/L (ref 0–44)
AST: 16 U/L (ref 15–41)
Albumin: 3.4 g/dL — ABNORMAL LOW (ref 3.5–5.0)
Alkaline Phosphatase: 77 U/L (ref 38–126)
Anion gap: 13 (ref 5–15)
BUN: 30 mg/dL — ABNORMAL HIGH (ref 6–20)
CALCIUM: 9.1 mg/dL (ref 8.9–10.3)
CO2: 28 mmol/L (ref 22–32)
Chloride: 94 mmol/L — ABNORMAL LOW (ref 98–111)
Creatinine: 1.35 mg/dL — ABNORMAL HIGH (ref 0.44–1.00)
GFR, Est AFR Am: 50 mL/min — ABNORMAL LOW (ref >60)
GFR, Estimated: 43 mL/min — ABNORMAL LOW (ref >60)
Glucose, Bld: 115 mg/dL — ABNORMAL HIGH (ref 70–99)
Potassium: 3 mmol/L — CL (ref 3.5–5.1)
Sodium: 135 mmol/L (ref 135–145)
Total Bilirubin: 1 mg/dL (ref 0.3–1.2)
Total Protein: 6.5 g/dL (ref 6.5–8.1)

## 2018-06-09 LAB — CBC WITH DIFFERENTIAL/PLATELET
BASOS PCT: 0 %
Band Neutrophils: 0 %
Basophils Absolute: 0 10*3/uL (ref 0.0–0.1)
Blasts: 0 %
Eosinophils Absolute: 0.3 10*3/uL (ref 0.0–0.5)
Eosinophils Relative: 1 %
HCT: 33.1 % — ABNORMAL LOW (ref 36.0–46.0)
Hemoglobin: 11.2 g/dL — ABNORMAL LOW (ref 12.0–15.0)
Lymphocytes Relative: 2 %
Lymphs Abs: 0.5 10*3/uL — ABNORMAL LOW (ref 0.7–4.0)
MCH: 30.9 pg (ref 26.0–34.0)
MCHC: 33.8 g/dL (ref 30.0–36.0)
MCV: 91.4 fL (ref 80.0–100.0)
MONO ABS: 0.5 10*3/uL (ref 0.1–1.0)
Metamyelocytes Relative: 0 %
Monocytes Relative: 2 %
Myelocytes: 0 %
Neutro Abs: 25.1 10*3/uL — ABNORMAL HIGH (ref 1.7–7.7)
Neutrophils Relative %: 95 %
OTHER: 0 %
PROMYELOCYTES RELATIVE: 0 %
Platelets: 150 10*3/uL (ref 150–400)
RBC: 3.62 MIL/uL — ABNORMAL LOW (ref 3.87–5.11)
RDW: 15 % (ref 11.5–15.5)
WBC: 26.4 10*3/uL — ABNORMAL HIGH (ref 4.0–10.5)
nRBC: 0 % (ref 0.0–0.2)
nRBC: 0 /100 WBC

## 2018-06-09 LAB — HEPATIC FUNCTION PANEL
ALT: 23 U/L (ref 0–44)
AST: 19 U/L (ref 15–41)
Albumin: 3.5 g/dL (ref 3.5–5.0)
Alkaline Phosphatase: 57 U/L (ref 38–126)
Bilirubin, Direct: 0.3 mg/dL — ABNORMAL HIGH (ref 0.0–0.2)
Indirect Bilirubin: 0.8 mg/dL (ref 0.3–0.9)
Total Bilirubin: 1.1 mg/dL (ref 0.3–1.2)
Total Protein: 6 g/dL — ABNORMAL LOW (ref 6.5–8.1)

## 2018-06-09 LAB — BASIC METABOLIC PANEL
Anion gap: 12 (ref 5–15)
BUN: 32 mg/dL — ABNORMAL HIGH (ref 6–20)
CO2: 28 mmol/L (ref 22–32)
CREATININE: 1.32 mg/dL — AB (ref 0.44–1.00)
Calcium: 8.3 mg/dL — ABNORMAL LOW (ref 8.9–10.3)
Chloride: 93 mmol/L — ABNORMAL LOW (ref 98–111)
GFR calc Af Amer: 51 mL/min — ABNORMAL LOW (ref >60)
GFR calc non Af Amer: 44 mL/min — ABNORMAL LOW (ref >60)
Glucose, Bld: 107 mg/dL — ABNORMAL HIGH (ref 70–99)
Potassium: 2.7 mmol/L — CL (ref 3.5–5.1)
Sodium: 133 mmol/L — ABNORMAL LOW (ref 135–145)

## 2018-06-09 LAB — CBC WITH DIFFERENTIAL (CANCER CENTER ONLY)
Abs Immature Granulocytes: 0.3 10*3/uL — ABNORMAL HIGH (ref 0.00–0.07)
Basophils Absolute: 0 10*3/uL (ref 0.0–0.1)
Basophils Relative: 0 %
Eosinophils Absolute: 0 10*3/uL (ref 0.0–0.5)
Eosinophils Relative: 0 %
HCT: 35.6 % — ABNORMAL LOW (ref 36.0–46.0)
Hemoglobin: 12.3 g/dL (ref 12.0–15.0)
Lymphocytes Relative: 2 %
Lymphs Abs: 0.6 10*3/uL — ABNORMAL LOW (ref 0.7–4.0)
MCH: 30.4 pg (ref 26.0–34.0)
MCHC: 34.6 g/dL (ref 30.0–36.0)
MCV: 88.1 fL (ref 80.0–100.0)
Metamyelocytes Relative: 1 %
Monocytes Absolute: 0 10*3/uL — ABNORMAL LOW (ref 0.1–1.0)
Monocytes Relative: 0 %
Neutro Abs: 29.3 10*3/uL — ABNORMAL HIGH (ref 1.7–17.7)
Neutrophils Relative %: 97 %
Platelet Count: 165 10*3/uL (ref 150–400)
RBC: 4.04 MIL/uL (ref 3.87–5.11)
RDW: 14.9 % (ref 11.5–15.5)
WBC Count: 30.2 10*3/uL — ABNORMAL HIGH (ref 4.0–10.5)
nRBC: 0 % (ref 0.0–0.2)

## 2018-06-09 LAB — URINALYSIS, ROUTINE W REFLEX MICROSCOPIC
BILIRUBIN URINE: NEGATIVE
Glucose, UA: NEGATIVE mg/dL
Hgb urine dipstick: NEGATIVE
Ketones, ur: NEGATIVE mg/dL
Leukocytes, UA: NEGATIVE
Nitrite: NEGATIVE
Protein, ur: NEGATIVE mg/dL
SPECIFIC GRAVITY, URINE: 1.017 (ref 1.005–1.030)
pH: 6 (ref 5.0–8.0)

## 2018-06-09 LAB — TROPONIN I: Troponin I: <0.03 ng/mL (ref <0.03)

## 2018-06-09 LAB — LIPASE, BLOOD: Lipase: 31 U/L (ref 11–51)

## 2018-06-09 LAB — MAGNESIUM: Magnesium: 1 mg/dL — CL (ref 1.7–2.4)

## 2018-06-09 MED ORDER — POTASSIUM CHLORIDE CRYS ER 20 MEQ PO TBCR
60.0000 meq | EXTENDED_RELEASE_TABLET | Freq: Once | ORAL | Status: AC
  Administered 2018-06-09: 60 meq via ORAL
  Filled 2018-06-09: qty 3

## 2018-06-09 MED ORDER — CALCIUM GLUCONATE-NACL 1-0.675 GM/50ML-% IV SOLN
1.0000 g | Freq: Once | INTRAVENOUS | Status: AC
  Administered 2018-06-09: 1000 mg via INTRAVENOUS
  Filled 2018-06-09: qty 50

## 2018-06-09 MED ORDER — ZOLPIDEM TARTRATE 5 MG PO TABS
5.0000 mg | ORAL_TABLET | Freq: Every day | ORAL | Status: DC
  Administered 2018-06-09 – 2018-06-10 (×2): 5 mg via ORAL
  Filled 2018-06-09 (×2): qty 1

## 2018-06-09 MED ORDER — SODIUM CHLORIDE 0.9 % IV SOLN
INTRAVENOUS | Status: AC
  Administered 2018-06-10: via INTRAVENOUS

## 2018-06-09 MED ORDER — LIDOCAINE-PRILOCAINE 2.5-2.5 % EX CREA
1.0000 "application " | TOPICAL_CREAM | CUTANEOUS | Status: DC | PRN
  Filled 2018-06-09: qty 5

## 2018-06-09 MED ORDER — ACETAMINOPHEN 325 MG PO TABS: 650.0000 mg | ORAL_TABLET | Freq: Four times a day (QID) | ORAL | Status: DC | PRN

## 2018-06-09 MED ORDER — POTASSIUM CHLORIDE 10 MEQ/100ML IV SOLN
10.0000 meq | Freq: Once | INTRAVENOUS | Status: AC
  Administered 2018-06-09: 10 meq via INTRAVENOUS
  Filled 2018-06-09: qty 100

## 2018-06-09 MED ORDER — METHOCARBAMOL 500 MG PO TABS: 500.0000 mg | ORAL_TABLET | Freq: Four times a day (QID) | ORAL | Status: DC | PRN

## 2018-06-09 MED ORDER — OLANZAPINE 5 MG PO TABS
10.0000 mg | ORAL_TABLET | Freq: Every day | ORAL | Status: DC
  Filled 2018-06-09: qty 1
  Filled 2018-06-09: qty 2

## 2018-06-09 MED ORDER — OXYCODONE HCL ER 20 MG PO T12A
20.0000 mg | EXTENDED_RELEASE_TABLET | Freq: Two times a day (BID) | ORAL | Status: DC
  Administered 2018-06-09 – 2018-06-11 (×4): 20 mg via ORAL
  Filled 2018-06-09 (×2): qty 2
  Filled 2018-06-09 (×2): qty 1

## 2018-06-09 MED ORDER — HYDROMORPHONE HCL 2 MG PO TABS: 2.0000 mg | ORAL_TABLET | ORAL | Status: DC | PRN

## 2018-06-09 MED ORDER — CARVEDILOL 3.125 MG PO TABS
3.1250 mg | ORAL_TABLET | Freq: Two times a day (BID) | ORAL | Status: DC
  Administered 2018-06-10 – 2018-06-11 (×3): 3.125 mg via ORAL
  Filled 2018-06-09 (×3): qty 1

## 2018-06-09 MED ORDER — LOPERAMIDE HCL 2 MG PO CAPS
2.0000 mg | ORAL_CAPSULE | ORAL | Status: DC | PRN
  Administered 2018-06-09 – 2018-06-11 (×14): 2 mg via ORAL
  Filled 2018-06-09 (×15): qty 1

## 2018-06-09 MED ORDER — SODIUM CHLORIDE 0.9 % IV BOLUS
500.0000 mL | Freq: Once | INTRAVENOUS | Status: AC
  Administered 2018-06-09: 500 mL via INTRAVENOUS

## 2018-06-09 MED ORDER — MAGNESIUM SULFATE 2 GM/50ML IV SOLN
2.0000 g | Freq: Once | INTRAVENOUS | Status: AC
  Administered 2018-06-09: 2 g via INTRAVENOUS
  Filled 2018-06-09: qty 50

## 2018-06-09 MED ORDER — IPRATROPIUM-ALBUTEROL 0.5-2.5 (3) MG/3ML IN SOLN: 3.0000 mL | RESPIRATORY_TRACT | Status: DC | PRN

## 2018-06-09 MED ORDER — ACETAMINOPHEN 650 MG RE SUPP: 650.0000 mg | Freq: Four times a day (QID) | RECTAL | Status: DC | PRN

## 2018-06-09 MED ORDER — ONDANSETRON HCL 4 MG/2ML IJ SOLN
4.0000 mg | Freq: Four times a day (QID) | INTRAMUSCULAR | Status: DC | PRN
  Administered 2018-06-10 – 2018-06-11 (×3): 4 mg via INTRAVENOUS
  Filled 2018-06-09 (×3): qty 2

## 2018-06-09 MED ORDER — ONDANSETRON HCL 4 MG PO TABS: 4.0000 mg | ORAL_TABLET | Freq: Four times a day (QID) | ORAL | Status: DC | PRN

## 2018-06-09 MED ORDER — ASPIRIN EC 81 MG PO TBEC
81.0000 mg | DELAYED_RELEASE_TABLET | Freq: Every day | ORAL | Status: DC
  Administered 2018-06-09 – 2018-06-11 (×3): 81 mg via ORAL
  Filled 2018-06-09 (×3): qty 1

## 2018-06-09 MED ORDER — SODIUM CHLORIDE 0.9% FLUSH
3.0000 mL | Freq: Two times a day (BID) | INTRAVENOUS | Status: DC
  Administered 2018-06-09: 3 mL via INTRAVENOUS

## 2018-06-09 MED ORDER — GABAPENTIN 300 MG PO CAPS
300.0000 mg | ORAL_CAPSULE | Freq: Three times a day (TID) | ORAL | Status: DC
  Administered 2018-06-09 – 2018-06-11 (×5): 300 mg via ORAL
  Filled 2018-06-09 (×5): qty 1

## 2018-06-09 MED ORDER — LORAZEPAM 0.5 MG PO TABS
0.5000 mg | ORAL_TABLET | Freq: Three times a day (TID) | ORAL | Status: DC | PRN
  Administered 2018-06-10: 1 mg via ORAL
  Filled 2018-06-09: qty 2

## 2018-06-09 MED ORDER — MAGIC MOUTHWASH W/LIDOCAINE
5.0000 mL | Freq: Three times a day (TID) | ORAL | Status: DC | PRN
  Filled 2018-06-09: qty 5

## 2018-06-09 NOTE — H&P (Signed)
History and Physical    [name] YBW:[id number] DOB: 10/01/1960 DOA: 06/09/2018  Referring MD/NP/PA: Nuala Alpha, PA-C PCP: Default, Provider, MD  Patient coming from: home   Chief Complaint: Lightheadedness  I have personally briefly reviewed patient's old medical records in [place]   HPI: [name] is a 57 y.o. 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  Patient reports that she just received chemotherapy on 12/13 and had an infusion of pegfilgrastim yesterday.  However this weekend patient reported feeling lightheaded as though she may pass out and very weak.  Her husband had to push her around a wheelchair due to her symptoms.  Patient reports that she almost passed out yesterday while going to bathroom falling to her knees. She did not hit her head or lose consciousness, but sustained a bruise to her left knee.  Patient also notes that she had these intermittent pains on the right side of her breast that felt like air bubble lasting only 15 seconds.  Other associated symptoms include poor p.o. intake, mild dysuria, and several episodes of nonbloody diarrhea over the last 48 hours.  Patient notes that she has held off on her diuretics over the last week due to her symptoms.  Denies having any chest pain, shortness of breath, fever, chills, or palpitations.  She was seen by her oncologist this morning due to her symptoms and blood work was checked.  She was notified that her electrolytes magnesium and potassium were significantly low and that she should come to the hospital for further evaluation.  ED Course: Admission to the emergency department patient was noted to be afebrile, pulse 95-1 01, respirations 11-26, blood pressure 91/58-118/73, and O2 saturation maintained on room air.   Labs revealed WBC 26.4, hemoglobin 11.2, platelets 150, sodium 133, potassium 2.7, chloride 93, BUN 32, creatinine 1.32, and lab work obtained prior to arrival showing magnesium of 1.  Patient was given 500 mL bolus of normal saline IV fluids, 60 mEq potassium chloride p.o., 20 mEq of potassium chloride IV,  And 2 g of magnesium sulfate IV.  TRH called to admit for dehydration.  Review of Systems  Constitutional: Positive for malaise/fatigue. Negative for fever.  HENT: Negative for ear discharge and nosebleeds.   Eyes: Negative for double vision and photophobia.  Respiratory: Positive for cough. Negative for sputum production and shortness of breath.   Cardiovascular: Positive for chest pain. Negative for leg swelling.  Gastrointestinal: Positive for diarrhea. Negative for abdominal pain, nausea and vomiting.  Genitourinary: Positive for dysuria. Negative for flank pain.  Musculoskeletal: Positive for falls and joint pain.  Skin: Negative for itching and rash.  Neurological: Positive for weakness. Negative for loss of consciousness.  Psychiatric/Behavioral: Negative for substance abuse. The patient does not have insomnia.     Past Medical History:  Diagnosis Date  . Arthritis    "everywhere"  . Asthma    as a child  . Automatic implantable cardioverter-defibrillator in situ    DR. Beckie Salts   . Bladder cancer [facility]) 2009   "injected medicine to get rid of it"  . Chronic systolic CHF (congestive heart failure) ([place])   . CKD (chronic kidney disease), stage III ([place])   . Coronary artery disease    a. large RV/inferior MI complicated by pseudoaneurysm s/p aneurysemectomy and CABG 10/2009 with papillary muscle  rupture requiring bioprosthetic mitral valve replacement in [facility].  . Granulosa cell carcinoma of ovary ([place])    recurrent - surgical resection 2007, 2014 and 2016   . H/O thymectomy   . High cholesterol   . Hypothyroidism   . ICD (implantable cardiac defibrillator) in place    . Ischemic cardiomyopathy    a.  ICM and VT arrest 10/2009 s/p AICD (revision 01/2012 due to RV lead problem).  . Myocardial infarction ([facility]) 10/14/2009  . Neuropathy    FEET AND HANDS - FROM CHEMO - BUT MUCH IMPROVED  . PAF (paroxysmal atrial fibrillation) ([place])   . Pain    JOINT PAINS AND MUSCLE ACHES ALL OVER.  Marland Kitchen Port-A-Cath in place    RIGHT UPPER CHEST  . Prosthetic valve dysfunction   . S/P mitral valve replacement with bioprosthetic valve 10/21/2009   Edwards Perimount stented bovine pericardial tissue valve, size 29 mm  . S/P valve-in-valve transcatheter mitral valve replacement 09/16/2017   29 mm Edwards Sapien 3 transcatheter heart valve placed via transapical approach  . SVT (supraventricular tachycardia) ([place])   . Tobacco abuse   . Ventricular tachycardia [facility])     Past Surgical History:  Procedure Laterality Date  . ABDOMINAL HYSTERECTOMY N/A 07/27/2013   Procedure: TUMOR EXCISION OF ABDOMINAL MASS;  Surgeon: Alvino Chapel, MD;  Location: [facility]S;  Service: Gynecology;  Laterality: N/A;  . APPENDECTOMY  1989  . BILATERAL OOPHORECTOMY  2007  . CARDIAC DEFIBRILLATOR PLACEMENT  02/06/2012   "lead change"  . CARDIAC VALVE REPLACEMENT    . CYSTOSCOPY Right 02/23/2013   Procedure: CYSTOSCOPY WITH STENT REMOVAL;  Surgeon: Alvino Chapel, MD;  Location: [facility];  Service: Gynecology;  Laterality: Right;  . IMPLANTABLE CARDIOVERTER DEFIBRILLATOR REVISION N/A 02/06/2012   Procedure: IMPLANTABLE CARDIOVERTER DEFIBRILLATOR REVISION;  Surgeon: Evans Lance, MD;  Location: [facility] CATH LAB;  Service: Cardiovascular;  Laterality: N/A;  . INSERT / REPLACE / REMOVE PACEMAKER  10/2009   DUAL-CHAMBER [facility]. JUDE DEFIBRILLATOR  . IR IMAGING GUIDED PORT INSERTION  05/11/2018  . LAPAROTOMY N/A 01/19/2013   Procedure: TUMOR DEBULKING / BOWEL RESECTION/INSERTION RIGHT UTERERAL DOUBLE J STENT/OMENTECTOMY;  Surgeon: Alvino Chapel, MD;  Location: [facility];  Service: Gynecology;   Laterality: N/A;  . MITRAL VALVE REPLACEMENT  10/21/2009   73mm Edwards Perimount bovine pericardial tissue valve - [facility]  . RIGHT/LEFT HEART CATH AND CORONARY ANGIOGRAPHY N/A 07/02/2017   Procedure: RIGHT/LEFT HEART CATH AND CORONARY ANGIOGRAPHY;  Surgeon: Belva Crome, MD;  Location: [place] CV LAB;  Service: Cardiovascular;  Laterality: N/A;  . TEE WITHOUT CARDIOVERSION N/A 09/16/2017   Procedure: TRANSESOPHAGEAL ECHOCARDIOGRAM (TEE);  Surgeon: [name], MD;  Location: [place];  Service: Open Heart Surgery;  Laterality: N/A;  . THYMECTOMY  2009  . TONSILLECTOMY AND ADENOIDECTOMY  ~ 1967  . TOTAL HIP ARTHROPLASTY  05/06/2012   Procedure: TOTAL HIP ARTHROPLASTY;  Surgeon: Gearlean Alf, MD;  Location: [facility];  Service: Orthopedics;  Laterality: Right;  . TRANSCATHETER MITRAL VALVE REPLACEMENT, TRANSAPICAL N/A 09/16/2017   Procedure: TRANSCATHETER MITRAL VALVE REPLACEMENT,TRANSAPICAL;  Surgeon: [name], MD;  Location: [place];  Service: Open Heart Surgery;  Laterality: N/A;  . TRANSURETHRAL RESECTION OF BLADDER  2009   "for bladder cancer"  . TUBAL LIGATION  1993  . VAGINAL HYSTERECTOMY  2001     reports that she has been smoking cigarettes. She started smoking about 44 years ago. She has a 15.00 pack-year smoking history.  She has never used smokeless tobacco. She reports that she does not drink alcohol or use drugs.  Allergies  Allergen Reactions  . Prochlorperazine Edisylate Other (See Comments)    Nervous/ flutter/ shakes--compazine  . Adhesive [Tape] Other (See Comments)    Redness/skin peeling Redness/hives from adhesive tape( tolerates latex gloves); tolerates paper tape  . Prochlorperazine Other (See Comments)    Jitters, hyper    Family History  Problem Relation Age of Onset  . Heart attack Paternal Grandfather   . Heart attack Maternal Grandfather   . Stroke Maternal Grandmother   . Heart attack Brother     Prior to Admission medications     Medication Sig Start Date End Date Taking? Authorizing Provider  aspirin EC 81 MG EC tablet Take 1 tablet (81 mg total) by mouth daily. 09/19/17  Yes Gold, Wayne E, PA-C  carvedilol (COREG) 3.125 MG tablet Take 1 tablet (3.125 mg total) by mouth 2 (two) times daily with a meal. 10/06/17  Yes Tacy Dura, Donielle M, PA-C  furosemide (LASIX) 20 MG tablet TAKE 1 TAB BY MOUTH DAILY. MAY TAKE AN EXTRA TAB AS NEEDED FOR LEG SWELLING OR SHORTNESS OF BREATH Patient taking differently: Take 20 mg by mouth daily.  10/29/17  Yes Evans Lance, MD  gabapentin (NEURONTIN) 300 MG capsule TAKE 1 CAPSULE BY MOUTH THREE TIMES A DAY Patient taking differently: Take 300 mg by mouth 3 (three) times daily.  06/06/18  Yes Volanda Napoleon, MD  HYDROmorphone (DILAUDID) 2 MG tablet Take 1 tablet (2 mg total) by mouth every 4 (four) hours as needed for severe pain. 05/22/18  Yes [place], Holli Humbles, NP  lidocaine-prilocaine (EMLA) cream Apply 1 application topically as needed. Apply to port per instructions 05/22/18  Yes Ennever, Rudell Cobb, MD  LORazepam (ATIVAN) 1 MG tablet Take 1 tablet (1 mg total) by mouth every 8 (eight) hours as needed (for nausea and vomiting). Patient taking differently: Take 0.5-1 mg by mouth every 8 (eight) hours as needed (for nausea and vomiting).  05/25/18  Yes [place], Holli Humbles, NP  magic mouthwash w/lidocaine SOLN Take 5 mLs by mouth 3 (three) times daily as needed for mouth pain. 05/25/18  Yes [place], Holli Humbles, NP  methocarbamol (ROBAXIN) 500 MG tablet Take 1 tablet (500 mg total) by mouth every 6 (six) hours as needed for muscle spasms. 05/15/18  Yes Ennever, Rudell Cobb, MD  OLANZapine (ZYPREXA) 10 MG tablet Take 1 tablet (10 mg total) by mouth at bedtime. 06/05/18  Yes Volanda Napoleon, MD  ondansetron (ZOFRAN) 8 MG tablet Take 1 tablet (8 mg total) by mouth every 12 (twelve) hours as needed for nausea or vomiting. 06/05/18  Yes Ennever, Rudell Cobb, MD  oxyCODONE (OXYCONTIN) 20 mg 12 hr tablet  Take 1 tablet (20 mg total) by mouth every 12 (twelve) hours. 05/15/18  Yes Volanda Napoleon, MD  spironolactone (ALDACTONE) 25 MG tablet Take 1 tablet (25 mg total) by mouth daily. 06/11/17 06/09/18 Yes Belva Crome, MD  warfarin (COUMADIN) 7.5 MG tablet Take 1 tablet (7.5 mg total) by mouth daily at 6 PM. 05/15/18  Yes Ennever, Rudell Cobb, MD  zolpidem (AMBIEN) 5 MG tablet TAKE 1 TABLET BY MOUTH EVERYDAY AT BEDTIME 05/25/18  Yes Volanda Napoleon, MD    Physical Exam:  Constitutional: Chronically ill-appearing female who appears to be in no acute distress at this time Vitals:   06/09/18 1959 06/09/18 2030 06/09/18 2031 06/09/18 2100  BP: 108/73 97/64 97/64  109/73  Pulse: 98 99 99 (!) 101  Resp: 16 11 14  (!) 26  Temp:      TempSrc:      SpO2: 98% 98% 100% 99%   Eyes: PERRL, lids and conjunctivae normal ENMT: Mucous membranes are dry. Posterior pharynx clear of any exudate or lesions..  Neck: normal, supple, no masses, no thyromegaly Respiratory: clear to auscultation bilaterally, no wheezing, no crackles. Normal respiratory effort. No accessory muscle use.  Cardiovascular: Regular rate and rhythm, no murmurs / rubs / gallops. No extremity edema. 2+ pedal pulses. No carotid bruits.  Abdomen: no tenderness, no masses palpated. No hepatosplenomegaly. Bowel sounds positive.  Musculoskeletal: no clubbing / cyanosis. No joint deformity upper and lower extremities. Good ROM, no contractures. Normal muscle tone.  Skin: Bruise noted of the left knee. Neurologic: CN 2-12 grossly intact. Sensation intact, DTR normal. Strength 5/5 in all 4.  Psychiatric: Normal judgment and insight. Alert and oriented x 3. Normal mood.     Labs on Admission: I have personally reviewed following labs and imaging studies  CBC: Recent Labs  Lab 06/09/18 1607 06/09/18 1937  WBC 30.2* 26.4*  NEUTROABS 29.3* 25.1*  HGB 12.3 11.2*  HCT 35.6* 33.1*  MCV 88.1 91.4  PLT 165 182   Basic Metabolic Panel: Recent  Labs  Lab 06/09/18 1607 06/09/18 1937  NA 135 133*  K 3.0* 2.7*  CL 94* 93*  CO2 28 28  GLUCOSE 115* 107*  BUN 30* 32*  CREATININE 1.35* 1.32*  CALCIUM 9.1 8.3*  MG 1.0*  --    GFR: Estimated Creatinine Clearance: 51.6 mL/min (A) (by C-G formula based on SCr of 1.32 mg/dL (H)). Liver Function Tests: Recent Labs  Lab 06/09/18 1607 06/09/18 1937  AST 16 19  ALT 25 23  ALKPHOS 77 57  BILITOT 1.0 1.1  PROT 6.5 6.0*  ALBUMIN 3.4* 3.5   Recent Labs  Lab 06/09/18 1937  LIPASE 31   No results for input(s): AMMONIA in the last 168 hours. Coagulation Profile: No results for input(s): INR, PROTIME in the last 168 hours. Cardiac Enzymes: Recent Labs  Lab 06/09/18 1937  TROPONINI <0.03   BNP (last 3 results) No results for input(s): PROBNP in the last 8760 hours. HbA1C: No results for input(s): HGBA1C in the last 72 hours. CBG: No results for input(s): GLUCAP in the last 168 hours. Lipid Profile: No results for input(s): CHOL, HDL, LDLCALC, TRIG, CHOLHDL, LDLDIRECT in the last 72 hours. Thyroid Function Tests: No results for input(s): TSH, T4TOTAL, FREET4, T3FREE, THYROIDAB in the last 72 hours. Anemia Panel: No results for input(s): VITAMINB12, FOLATE, FERRITIN, TIBC, IRON, RETICCTPCT in the last 72 hours. Urine analysis:    Component Value Date/Time   COLORURINE YELLOW 06/09/2018 2056   APPEARANCEUR CLEAR 06/09/2018 2056   LABSPEC 1.017 06/09/2018 2056   LABSPEC 1.015 02/15/2013 1036   PHURINE 6.0 06/09/2018 2056   GLUCOSEU NEGATIVE 06/09/2018 2056   GLUCOSEU Negative 02/15/2013 1036   HGBUR NEGATIVE 06/09/2018 2056   BILIRUBINUR NEGATIVE 06/09/2018 2056   BILIRUBINUR Negative 02/15/2013 1036   KETONESUR NEGATIVE 06/09/2018 2056   PROTEINUR NEGATIVE 06/09/2018 2056   UROBILINOGEN 0.2 02/15/2013 1036   NITRITE NEGATIVE 06/09/2018 2056   LEUKOCYTESUR NEGATIVE 06/09/2018 2056   LEUKOCYTESUR Moderate 02/15/2013 1036   Sepsis Labs: No results found for this  or any previous visit (from the past 240 hour(s)).   Radiological Exams on Admission: Dg Chest 2 View  Result Date: 06/09/2018 CLINICAL DATA:  Weakness and lightheadedness. History  of ovarian cancer. EXAM: CHEST - 2 VIEW COMPARISON:  Chest x-ray 10/06/2017 FINDINGS: The pacer wires/AICD wires are stable. The heart is normal in size. Mild tortuosity and calcification of the thoracic aorta. The pulmonary hila appear normal. Right-sided IJ Port-A-Cath tip is in good position. No complicating features. Evidence of prior transcatheter mitral valve replacement. The lungs are clear of an acute process. There are left basilar scarring changes. Numerous calcifications around the left shoulder likely HADD or synovial osteochondromatosis. IMPRESSION: No acute cardiopulmonary findings. Electronically Signed   By: Marijo Sanes M.D.   On: 06/09/2018 21:01    EKG: Independently reviewed.  Sinus rhythm at 95 bpm  Assessment/Plan  Hypokalemia: Acute. Initial potassium noted to be 2.6 evident on admission.  Patient given potassium chloride 60 mEq p.o. and ordered 20 mEq IV. - Admit to telemetry bed - Recheck BMP in a.m., and replace as needed   Hypomagnesia: Acute.  Prior to admission patient noted to have magnesium of 1.  Patient given 2 g of magnesium sulfate in ED.   - Recheck magnesium level in a.m. and replace as needed  Hypocalcemia: Acute. Initial calcium noted to be 8.3. - Give 1 g calcium gluconate  - Continue to monitor and replace as needed  Dehydration with renal insufficiency: Patient presents with a creatinine of 1.32 with BUN 32.  Baseline creatinine previously noted to be around 1-1.1.  Elevated BUN to creatinine ratio suggests prerenal cause of symptoms.  Patient reports recent diarrhea.  Patient also noted to be on medications of Lasix and spironolactone although reports not recently taking these.  Was given 500 mL normal saline IV fluids. - IV fluids normal saline 75 mL/h x12 hours -  Recheck creatinine in a.m.     Leukocytosis: Acute.  WBC elevated at 26.4.  Urinalysis did not show any clear signs of infection.  Chest x-ray otherwise noted to be clear.  Patient recently given pegfilgrastim and steroids as possible causes and appears to be trending downward. - Recheck CBC in a.m.  Systolic CHF: Patient appears to be clinically dry at this time.  Last echocardiogram in 10/2017 noted EF to be around 45%. - Strict intake and output  - Daily weights - Hold spironolactone and Lasix. Restart when medically appropriate  Diarrhea: She reports having several episodes of diarrhea prior to arrival that had improved since having nothing to eat recently. - Monitor intake and output - Imodium as needed - May warrant further work-up if symptoms persist  Paroxysmal atrial fibrillation on chronic anticoagulation, history of mitral valve replacement: Patient with previous history of transcatheter bovine mitral valve replacement.  Currently noted to be in sinus rhythm.  CHA2DS2-VASc score = 2.  Patient reports taking Coumadin as above.  Last INR noted to be 2.47 on 12/10. - Daily PT/INR - Coumadin per pharmacy - Continue Coreg   Anemia associated with chemotherapy: Acute.  Patient's hemoglobin 11.2.  Patient reports no reports of bleeding. - Continue to monitor  Granulosa cell tumor of the ovary: Patient currently receiving chemotherapy and radiation followed by Dr. Marin Olp. - Continue oxycodone and hydromorphone prn  - May want to Notify Dr. Marin Olp that the patient is admitted into the hospital - Continue outpatient treatment  History of ventricular tachycardia s/p pacemaker  Anxiety - Continue Ativan as needed  DVT prophylaxis: Warfarin Code Status: Full Family Communication: No family present at bedside Disposition Plan: Likely discharge home in 1 to 2 days Consults called: None Admission status: Observation  Rondell A Tamala Julian  MD [facility] Pager  [phone]   If 7PM-7AM, please contact night-coverage [email or web address] Password [facility]  06/09/2018, 9:33 PM

## 2018-06-09 NOTE — ED Triage Notes (Signed)
Pt reports had [facility] work done today at [facility] and was called and told Mag and Potassium levels are low and need to get to ED right away. Pt reports that she dropped 18 lbs since Friday.  Last chemo treatment was on Friday. Still has few more weeks on radiation.  Pt c/o right rib pain that is intermittent starting this afternoon.

## 2018-06-09 NOTE — ED Provider Notes (Addendum)
[name] DEPT Provider Note   CSN: [id number] Arrival date & time: 06/09/18  1817     History   Chief Complaint Chief Complaint  Patient presents with  . abnormal labs    HPI [name] is a 57 y.o. female currently undergoing chemotherapy and radiation for ovarian cancer presenting today at the behest of her oncologist following abnormality and lab work today.  Of note lab work shows magnesium of 1.0, potassium of 3.0, leukocytosis of 30.2.  Patient states that over the past 3 days she has been feeling increase in fatigue, chills with addition of mild nonproductive cough and diarrhea.  She denies history of fever, nausea/vomiting, chest pain, shortness of breath, extremity swelling, wound, headache or vision changes.  Patient reports a fall that occurred 2 days ago, she states that she tripped falling onto her left knee.  Ecchymosis present to left knee, she states that she is ambulatory without difficulty or assistance on the knee.  Denies head injury or loss of consciousness.  Finally patient endorses right upper quadrant/lower rib pressure that she describes as a balloon-like feeling that lasts a few seconds before self resolving that has occurred 3-4 times since this afternoon.  Denies associated chest pain/shortness of breath.  Upon evaluation patient patient denying pain, states that she feels well states that this pain is resolved prior to arrival.  HPI  Past Medical History:  Diagnosis Date  . Arthritis    "everywhere"  . Asthma    as a child  . Automatic implantable cardioverter-defibrillator in situ    DR. Beckie Salts   . Bladder cancer [facility]) 2009   "injected medicine to get rid of it"  . Chronic systolic CHF (congestive heart failure) ([place])   . CKD (chronic kidney disease), stage III ([place])   . Coronary artery disease    a. large RV/inferior MI complicated by pseudoaneurysm s/p aneurysemectomy and CABG 10/2009 with papillary  muscle rupture requiring bioprosthetic mitral valve replacement in [facility].  . 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 H/O thymectomy   . High cholesterol   . Hypothyroidism   . ICD (implantable cardiac defibrillator) in place   . Ischemic cardiomyopathy    a.  ICM and VT arrest 10/2009 s/p AICD (revision 01/2012 due to RV lead problem).  . Myocardial infarction ([place]) 10/14/2009  . Neuropathy    FEET AND HANDS - FROM CHEMO - BUT MUCH IMPROVED  . PAF (paroxysmal atrial fibrillation) ([place])   . Pain    JOINT PAINS AND MUSCLE ACHES ALL OVER.  Marland Kitchen Port-A-Cath in place    RIGHT UPPER CHEST  . Prosthetic valve dysfunction   . S/P mitral valve replacement with bioprosthetic valve 10/21/2009   Edwards Perimount stented bovine pericardial tissue valve, size 29 mm  . S/P valve-in-valve transcatheter mitral valve replacement 09/16/2017   29 mm Edwards Sapien 3 transcatheter heart valve placed via transapical approach  . SVT (supraventricular tachycardia) ([place])   . Tobacco abuse   . Ventricular tachycardia [facility])     Patient Active Problem List   Diagnosis Date Noted  . Intractable pain 05/01/2018  . Hypokalemia 05/01/2018  . Encounter for therapeutic drug monitoring 09/22/2017  . S/P valve-in-valve transcatheter mitral valve replacement 09/16/2017  . Prosthetic valve dysfunction   . Stenosis of prosthetic mitral valve   . Dyspnea 06/04/2017  . Abdominal pain   . Pancolitis ([place]) 06/03/2017  .  Paroxysmal atrial fibrillation ([place]) 09/02/2016  . COPD (chronic obstructive pulmonary disease) ([place]) 09/02/2016  . Hypoxia 05/28/2015  . Acute bronchitis 05/28/2015  . CKD (chronic kidney disease) stage 3, GFR 30-59 ml/min (H[facility]) 05/28/2015  . Chronic anemia 05/28/2015  . Anxiety 03/08/2014  . Granulosa cell tumor of ovary 01/20/2013  . OA (osteoarthritis) of hip 05/06/2012  . Automatic implantable cardioverter-defibrillator in situ  11/08/2009  . Athscl autologous vein CABG w oth angina pectoris ([place]) 11/07/2009  . VENTRICULAR TACHYCARDIA 11/07/2009  . VENTRICULAR FIBRILLATION 11/07/2009  . Chronic systolic CHF (congestive heart failure) ([place]) 11/07/2009  . S/P mitral valve replacement with bioprosthetic valve 10/21/2009    Past Surgical History:  Procedure Laterality Date  . ABDOMINAL HYSTERECTOMY N/A 07/27/2013   Procedure: TUMOR EXCISION OF ABDOMINAL MASS;  Surgeon: Alvino Chapel, MD;  Location: [facility] ORS;  Service: Gynecology;  Laterality: N/A;  . APPENDECTOMY  1989  . BILATERAL OOPHORECTOMY  2007  . CARDIAC DEFIBRILLATOR PLACEMENT  02/06/2012   "lead change"  . CARDIAC VALVE REPLACEMENT    . CYSTOSCOPY Right 02/23/2013   Procedure: CYSTOSCOPY WITH STENT REMOVAL;  Surgeon: Alvino Chapel, MD;  Location: [facility];  Service: Gynecology;  Laterality: Right;  . IMPLANTABLE CARDIOVERTER DEFIBRILLATOR REVISION N/A 02/06/2012   Procedure: IMPLANTABLE CARDIOVERTER DEFIBRILLATOR REVISION;  Surgeon: Evans Lance, MD;  Location: [facility] CATH LAB;  Service: Cardiovascular;  Laterality: N/A;  . INSERT / REPLACE / REMOVE PACEMAKER  10/2009   DUAL-CHAMBER St. JUDE DEFIBRILLATOR  . IR IMAGING GUIDED PORT INSERTION  05/11/2018  . LAPAROTOMY N/A 01/19/2013   Procedure: TUMOR DEBULKING / BOWEL RESECTION/INSERTION RIGHT UTERERAL DOUBLE J STENT/OMENTECTOMY;  Surgeon: Alvino Chapel, MD;  Location: [facility]S;  Service: Gynecology;  Laterality: N/A;  . MITRAL VALVE REPLACEMENT  10/21/2009   14mm Edwards Perimount bovine pericardial tissue valve - [facility]  . RIGHT/LEFT HEART CATH AND CORONARY ANGIOGRAPHY N/A 07/02/2017   Procedure: RIGHT/LEFT HEART CATH AND CORONARY ANGIOGRAPHY;  Surgeon: Belva Crome, MD;  Location: [place] CV LAB;  Service: Cardiovascular;  Laterality: N/A;  . TEE WITHOUT CARDIOVERSION N/A 09/16/2017   Procedure: TRANSESOPHAGEAL ECHOCARDIOGRAM (TEE);  Surgeon: [name], MD;  Location: [place];  Service: Open Heart Surgery;  Laterality: N/A;  . THYMECTOMY  2009  . TONSILLECTOMY AND ADENOIDECTOMY  ~ 1967  . TOTAL HIP ARTHROPLASTY  05/06/2012   Procedure: TOTAL HIP ARTHROPLASTY;  Surgeon: Gearlean Alf, MD;  Location: [facility];  Service: Orthopedics;  Laterality: Right;  . TRANSCATHETER MITRAL VALVE REPLACEMENT, TRANSAPICAL N/A 09/16/2017   Procedure: TRANSCATHETER MITRAL VALVE REPLACEMENT,TRANSAPICAL;  Surgeon: [name], MD;  Location: [place];  Service: Open Heart Surgery;  Laterality: N/A;  . TRANSURETHRAL RESECTION OF BLADDER  2009   "for bladder cancer"  . TUBAL LIGATION  1993  . VAGINAL HYSTERECTOMY  2001     OB History   No obstetric history on file.      Home Medications    Prior to Admission medications   Medication Sig Start Date End Date Taking? Authorizing Provider  aspirin EC 81 MG EC tablet Take 1 tablet (81 mg total) by mouth daily. 09/19/17  Yes Gold, Wayne E, PA-C  carvedilol (COREG) 3.125 MG tablet Take 1 tablet (3.125 mg total) by mouth 2 (two) times daily with a meal. 10/06/17  Yes Tacy Dura, Donielle M, PA-C  furosemide (LASIX) 20 MG tablet TAKE 1 TAB BY MOUTH DAILY. MAY TAKE AN EXTRA TAB AS NEEDED FOR LEG SWELLING OR SHORTNESS  OF BREATH Patient taking differently: Take 20 mg by mouth daily.  10/29/17  Yes Evans Lance, MD  gabapentin (NEURONTIN) 300 MG capsule TAKE 1 CAPSULE BY MOUTH THREE TIMES A DAY Patient taking differently: Take 300 mg by mouth 3 (three) times daily.  06/06/18  Yes Volanda Napoleon, MD  HYDROmorphone (DILAUDID) 2 MG tablet Take 1 tablet (2 mg total) by mouth every 4 (four) hours as needed for severe pain. 05/22/18  Yes [place], Holli Humbles, NP  lidocaine-prilocaine (EMLA) cream Apply 1 application topically as needed. Apply to port per instructions 05/22/18  Yes Ennever, Rudell Cobb, MD  LORazepam (ATIVAN) 1 MG tablet Take 1 tablet (1 mg total) by mouth every 8 (eight) hours as needed (for nausea and vomiting). Patient taking  differently: Take 0.5-1 mg by mouth every 8 (eight) hours as needed (for nausea and vomiting).  05/25/18  Yes [place], Holli Humbles, NP  magic mouthwash w/lidocaine SOLN Take 5 mLs by mouth 3 (three) times daily as needed for mouth pain. 05/25/18  Yes [place], Holli Humbles, NP  methocarbamol (ROBAXIN) 500 MG tablet Take 1 tablet (500 mg total) by mouth every 6 (six) hours as needed for muscle spasms. 05/15/18  Yes Ennever, Rudell Cobb, MD  OLANZapine (ZYPREXA) 10 MG tablet Take 1 tablet (10 mg total) by mouth at bedtime. 06/05/18  Yes Volanda Napoleon, MD  ondansetron (ZOFRAN) 8 MG tablet Take 1 tablet (8 mg total) by mouth every 12 (twelve) hours as needed for nausea or vomiting. 06/05/18  Yes Ennever, Rudell Cobb, MD  oxyCODONE (OXYCONTIN) 20 mg 12 hr tablet Take 1 tablet (20 mg total) by mouth every 12 (twelve) hours. 05/15/18  Yes Volanda Napoleon, MD  spironolactone (ALDACTONE) 25 MG tablet Take 1 tablet (25 mg total) by mouth daily. 06/11/17 06/09/18 Yes Belva Crome, MD  warfarin (COUMADIN) 7.5 MG tablet Take 1 tablet (7.5 mg total) by mouth daily at 6 PM. 05/15/18  Yes Ennever, Rudell Cobb, MD  zolpidem (AMBIEN) 5 MG tablet TAKE 1 TABLET BY MOUTH EVERYDAY AT BEDTIME 05/25/18  Yes Ennever, Rudell Cobb, MD    Family History Family History  Problem Relation Age of Onset  . Heart attack Paternal Grandfather   . Heart attack Maternal Grandfather   . Stroke Maternal Grandmother   . Heart attack Brother     Social History Social History   Tobacco Use  . Smoking status: Current Some Day Smoker    Packs/day: 0.50    Years: 30.00    Pack years: 15.00    Types: Cigarettes    Start date: 03/28/1974  . Smokeless tobacco: Never Used  Substance Use Topics  . Alcohol use: No    Alcohol/week: 0.0 standard drinks  . Drug use: No     Allergies   Prochlorperazine edisylate; Adhesive [tape]; and Prochlorperazine   Review of Systems Review of Systems  Constitutional: Positive for chills and fatigue. Negative  for fever.  HENT: Negative.  Negative for rhinorrhea and sore throat.   Eyes: Negative.  Negative for visual disturbance.  Respiratory: Positive for cough. Negative for shortness of breath.   Cardiovascular: Negative.  Negative for chest pain and leg swelling.  Gastrointestinal: Positive for diarrhea. Negative for abdominal pain, blood in stool, nausea and vomiting.  Genitourinary: Negative.  Negative for dysuria and hematuria.  Musculoskeletal: Negative.  Negative for arthralgias and myalgias.  Skin: Negative.  Negative for rash and wound.  Neurological: Negative.  Negative for dizziness, weakness and headaches.  Physical Exam Updated Vital Signs BP 109/73   Pulse (!) 101   Temp 97.8 F (36.6 C) (Oral)   Resp (!) 26   SpO2 99%   Physical Exam Constitutional:      General: She is not in acute distress.    Appearance: She is well-developed. She is not ill-appearing.  HENT:     Head: Normocephalic and atraumatic.     Right Ear: Hearing, tympanic membrane, ear canal and external ear normal. No hemotympanum.     Left Ear: Hearing, tympanic membrane, ear canal and external ear normal. No hemotympanum.     Nose: Nose normal.     Mouth/Throat:     Lips: Pink.     Mouth: Mucous membranes are moist.     Pharynx: Oropharynx is clear.  Eyes:     General: Vision grossly intact. Gaze aligned appropriately.     Extraocular Movements: Extraocular movements intact.     Conjunctiva/sclera: Conjunctivae normal.     Pupils: Pupils are equal, round, and reactive to light.  Neck:     Musculoskeletal: Full passive range of motion without pain, normal range of motion and neck supple.     Trachea: Trachea and phonation normal. No tracheal deviation.  Cardiovascular:     Rate and Rhythm: Normal rate and regular rhythm.     Pulses: Normal pulses.          Dorsalis pedis pulses are 2+ on the right side and 2+ on the left side.       Posterior tibial pulses are 2+ on the right side and 2+ on the  left side.     Heart sounds: Normal heart sounds.  Pulmonary:     Effort: Pulmonary effort is normal. No respiratory distress.     Breath sounds: Normal breath sounds and air entry. No rhonchi.  Chest:     Chest wall: No deformity, tenderness or crepitus.     Comments: Well-healed median sternotomy scar.  Accessed Port-A-Cath present. Abdominal:     General: Bowel sounds are normal.     Palpations: Abdomen is soft.     Tenderness: There is no abdominal tenderness. There is no guarding or rebound.  Musculoskeletal: Normal range of motion.     Right knee: Normal.     Left knee: She exhibits ecchymosis. She exhibits normal range of motion and no deformity. Tenderness found.     Right lower leg: Normal.     Left lower leg: Normal.     Comments: Hips stable to compression bilaterally.  No midline C/T/L spinal tenderness to palpation, no paraspinal muscle tenderness, no deformity, crepitus, or step-off noted. No sign of injury to the neck or back.  Feet:     Right foot:     Protective Sensation: 3 sites tested. 3 sites sensed.     Left foot:     Protective Sensation: 3 sites tested. 3 sites sensed.  Skin:    General: Skin is warm and dry.     Capillary Refill: Capillary refill takes less than 2 seconds.  Neurological:     General: No focal deficit present.     Mental Status: She is alert.     GCS: GCS eye subscore is 4. GCS verbal subscore is 5. GCS motor subscore is 6.     Comments: Speech is clear and goal oriented, follows commands Major Cranial nerves without deficit, no facial droop Normal strength in upper and lower extremities bilaterally including dorsiflexion and plantar flexion, strong and equal  grip strength Sensation normal to light touch Moves extremities without ataxia, coordination intact Normal gait  Psychiatric:        Mood and Affect: Mood normal.        Behavior: Behavior normal.    ED Treatments / Results  Labs (all labs ordered are listed, but only  abnormal results are displayed) Labs Reviewed  CBC WITH DIFFERENTIAL/PLATELET - Abnormal; Notable for the following components:      Result Value   WBC 26.4 (*)    RBC 3.62 (*)    Hemoglobin 11.2 (*)    HCT 33.1 (*)    Neutro Abs 25.1 (*)    Lymphs Abs 0.5 (*)    All other components within normal limits  BASIC METABOLIC PANEL - Abnormal; Notable for the following components:   Sodium 133 (*)    Potassium 2.7 (*)    Chloride 93 (*)    Glucose, Bld 107 (*)    BUN 32 (*)    Creatinine, Ser 1.32 (*)    Calcium 8.3 (*)    GFR calc non Af Amer 44 (*)    GFR calc Af Amer 51 (*)    All other components within normal limits  HEPATIC FUNCTION PANEL - Abnormal; Notable for the following components:   Total Protein 6.0 (*)    Bilirubin, Direct 0.3 (*)    All other components within normal limits  CULTURE, BLOOD (ROUTINE X 2)  CULTURE, BLOOD (ROUTINE X 2)  URINE CULTURE  URINALYSIS, ROUTINE W REFLEX MICROSCOPIC  LIPASE, BLOOD  TROPONIN I    EKG EKG Interpretation  Date/Time:  Tuesday June 09 2018 19:58:01 EST Ventricular Rate:  95 PR Interval:    QRS Duration: 117 QT Interval:  362 QTC Calculation: 456 R Axis:   89 Text Interpretation:  Sinus rhythm Consider left atrial enlargement Nonspecific intraventricular conduction delay Inferior infarct, age indeterminate Lateral leads are also involved Non-specific intra-ventricular conduction delay New since previous tracing Confirmed by [name], Inocente Salles [phone]) on 06/09/2018 8:20:21 PM   Radiology Dg Chest 2 View  Result Date: 06/09/2018 CLINICAL DATA:  Weakness and lightheadedness. History of ovarian cancer. EXAM: CHEST - 2 VIEW COMPARISON:  Chest x-ray 10/06/2017 FINDINGS: The pacer wires/AICD wires are stable. The heart is normal in size. Mild tortuosity and calcification of the thoracic aorta. The pulmonary hila appear normal. Right-sided IJ Port-A-Cath tip is in good position. No complicating features. Evidence of prior  transcatheter mitral valve replacement. The lungs are clear of an acute process. There are left basilar scarring changes. Numerous calcifications around the left shoulder likely HADD or synovial osteochondromatosis. IMPRESSION: No acute cardiopulmonary findings. Electronically Signed   By: Marijo Sanes M.D.   On: 06/09/2018 21:01    Procedures Procedures (including critical care time)  Medications Ordered in ED Medications  magnesium sulfate IVPB 2 g 50 mL (has no administration in time range)  potassium chloride 10 mEq in 100 mL IVPB (10 mEq Intravenous New Bag/Given 06/09/18 2037)  sodium chloride 0.9 % bolus 500 mL (500 mLs Intravenous New Bag/Given 06/09/18 2030)  potassium chloride SA (K-DUR,KLOR-CON) CR tablet 60 mEq (has no administration in time range)     Initial Impression / Assessment and Plan / ED Course  I have reviewed the triage vital signs and the nursing notes.  Pertinent labs & imaging results that were available during my care of the patient were reviewed by me and considered in my medical decision making (see chart for details).    57 year old  female with history of ovarian cancer currently undergoing chemo and radiation therapy presenting today for hypomagnesemia and hypokalemia as noted on oncology blood work today.  No obvious infectious symptoms on history.  Patient feeling well without complaint.  Of note patient did complain of right rib "pressure/balloon-like feeling "that is self resolving lasting 30 seconds at a time.  No chest pain or shortness of breath on evaluation.  CBC with leukocytosis-patient on immunomodulating therapies BMP with hypokalemia of 2.7, creatinine appears baseline Troponin negative Urinalysis with normal limits Chest x-ray negative LFTs nonacute EKG reviewed by Dr. Cathleen Fears as above Fluid bolus given due to soft blood pressures Potassium and magnesium replacement begun in ED No indication for antibiotics at this time  Consult  called to hospitalist, Dr. Tamala Julian who has admitted patient to his service for further electrolyte repletion and observation.  Patient was also seen and evaluated by Dr. Cathleen Fears during this visit who agrees with work-up, electrolyte repletion and admission at this time.  Note: Portions of this report may have been transcribed using voice recognition software. Every effort was made to ensure accuracy; however, inadvertent computerized transcription errors may still be present.  Final Clinical Impressions(s) / ED Diagnoses   Final diagnoses:  Hypokalemia  Hypomagnesemia    ED Discharge Orders    None       Gari Crown 06/09/18 2157    Deliah Boston, PA-C 06/09/18 2324    Orlie Dakin, MD 06/09/18 [phone]

## 2018-06-09 NOTE — ED Provider Notes (Signed)
Planes of lightheadedness for the past 2 to 3 days, worse with standing.  She also feels pressure at her right lateral chest today episodic lasting 30 to 40 seconds at a time.  Presently asymptomatic.  She denies any shortness of breath.  Denies fever.  She was sent to [place] she was noted to have electrolyte abnormalities i.e. hypomagnesemia and hypokalemia from lab work performed at cancer center earlier today.  Patient noted to have leukocytosis.  She reports being given medicine to boost her white blood cell count after chemotherapy earlier this week   Orlie Dakin, MD 06/09/18 2033

## 2018-06-09 NOTE — Telephone Encounter (Signed)
Was contacted by lab at [facility] re: critical lab values of K+ 3.0 and Magnesium 1.0. This RN conveyed these values to Dr. Sondra Come who contacted pt at home and strongly encouraged pt to present to [facility] ED for replacement tonight.   This RN contacted [facility] ED charge RN Jinny Blossom to give brief report on pt with pertinent lab values. Loma Sousa, RN BSN

## 2018-06-09 NOTE — ED Notes (Signed)
ED TO INPATIENT HANDOFF REPORT  Name/Age/Gender [name] 57 y.o. female  Code Status Code Status History    Date Active Date Inactive Code Status Order ID Comments User Context   05/05/2018 1245 05/15/2018 1947 Full Code [id number]  Shelly Coss, MD Inpatient   05/01/2018 2209 05/02/2018 1705 Full Code [id number]  Vianne Bulls, MD ED   09/16/2017 1657 09/19/2017 1426 Full Code [id number]  Rexene Alberts, MD Inpatient   07/02/2017 1042 07/02/2017 1611 Full Code [id number]  Belva Crome, MD Inpatient   06/03/2017 2242 06/06/2017 1832 Full Code [id number]  Rise Patience, MD Inpatient   05/28/2015 2325 06/01/2015 1929 Full Code [id number]  Rise Patience, MD Inpatient   01/19/2013 2103 01/25/2013 1636 Full Code [id number]  Lahoma Crocker, MD Inpatient   05/06/2012 1355 05/08/2012 1443 Full Code [id number]  Denton Meek, RN Inpatient    Advance Directive Documentation     Most Recent Value  Type of Advance Directive  Healthcare Power of Attorney, Living will  Pre-existing out of facility DNR order (yellow form or pink MOST form)  -  "MOST" Form in Place?  -      Home/SNF/Other Home  Chief Complaint Fluids   Level of Care/Admitting Diagnosis ED Disposition    ED Disposition Condition Comment   Admit  The patient appears reasonably stabilized for admission considering the current resources, flow, and capabilities available in the ED at this time, and I doubt any other [facility] requiring further screening and/or treatment in the ED prior to admission is  present.       Medical History Past Medical History:  Diagnosis Date  . Arthritis    "everywhere"  . Asthma    as a child  . Automatic implantable cardioverter-defibrillator in situ    DR. Beckie Salts   . Bladder cancer [facility]) 2009   "injected medicine to get rid of it"  . Chronic systolic CHF (congestive heart failure) ([place])   . CKD (chronic kidney disease), stage III ([place])   . Coronary artery disease    a. large  RV/inferior MI complicated by pseudoaneurysm s/p aneurysemectomy and CABG 10/2009 with papillary muscle rupture requiring bioprosthetic mitral valve replacement in [facility].  . Granulosa cell carcinoma of ovary ([place])    recurrent - surgical resection 2007, 2014 and 2016   . H/O thymectomy   . High cholesterol   . Hypothyroidism   . ICD (implantable cardiac defibrillator) in place   . Ischemic cardiomyopathy    a.  ICM and VT arrest 10/2009 s/p AICD (revision 01/2012 due to RV lead problem).  . Myocardial infarction ([place]) 10/14/2009  . Neuropathy    FEET AND HANDS - FROM CHEMO - BUT MUCH IMPROVED  . PAF (paroxysmal atrial fibrillation) ([place])   . Pain    JOINT PAINS AND MUSCLE ACHES ALL OVER.  Marland Kitchen Port-A-Cath in place    RIGHT UPPER CHEST  . Prosthetic valve dysfunction   . S/P mitral valve replacement with bioprosthetic valve 10/21/2009   Edwards Perimount stented bovine pericardial tissue valve, size 29 mm  . S/P valve-in-valve transcatheter mitral valve replacement 09/16/2017   29 mm Edwards Sapien 3 transcatheter heart valve placed via transapical approach  . SVT (supraventricular tachycardia) ([place])   . Tobacco abuse   . Ventricular tachycardia (HCC)     Allergies Allergies  Allergen Reactions  . Prochlorperazine Edisylate Other (See Comments)    Nervous/ flutter/ shakes--compazine  . Adhesive [Tape] Other (See Comments)  Redness/skin peeling Redness/hives from adhesive tape( tolerates latex gloves); tolerates paper tape  . Prochlorperazine Other (See Comments)    Jitters, hyper    IV Location/Drains/Wounds Patient Lines/Drains/Airways Status   Active Line/Drains/Airways    Name:   Placement date:   Placement time:   Site:   Days:   Implanted Port 05/11/18 Right Chest   05/11/18    1116    Chest   29          Labs/Imaging Results for orders placed or performed during the hospital encounter of 06/09/18 (from the past 48 hour(s))  CBC with Differential     Status:  Abnormal   Collection Time: 06/09/18  7:37 PM  Result Value Ref Range   WBC 26.4 (H) 4.0 - 10.5 K/uL   RBC 3.62 (L) 3.87 - 5.11 MIL/uL   Hemoglobin 11.2 (L) 12.0 - 15.0 g/dL   HCT 33.1 (L) 36.0 - 46.0 %   MCV 91.4 80.0 - 100.0 fL   MCH 30.9 26.0 - 34.0 pg   MCHC 33.8 30.0 - 36.0 g/dL   RDW 15.0 11.5 - 15.5 %   Platelets 150 150 - 400 K/uL   nRBC 0.0 0.0 - 0.2 %   Neutrophils Relative % 95 %   Lymphocytes Relative 2 %   Monocytes Relative 2 %   Eosinophils Relative 1 %   Basophils Relative 0 %   Band Neutrophils 0 %   Metamyelocytes Relative 0 %   Myelocytes 0 %   Promyelocytes Relative 0 %   Blasts 0 %   nRBC 0 0 /100 WBC   Other 0 %   Neutro Abs 25.1 (H) 1.7 - 7.7 K/uL   Lymphs Abs 0.5 (L) 0.7 - 4.0 K/uL   Monocytes Absolute 0.5 0.1 - 1.0 K/uL   Eosinophils Absolute 0.3 0.0 - 0.5 K/uL   Basophils Absolute 0.0 0.0 - 0.1 K/uL   WBC Morphology WHITE COUNT CONFIRMED ON SMEAR     Comment: Performed at [facility], [place] [address] [place], [place] [address]  Basic metabolic panel     Status: Abnormal   Collection Time: 06/09/18  7:37 PM  Result Value Ref Range   Sodium 133 (L) 135 - 145 mmol/L   Potassium 2.7 (LL) 3.5 - 5.1 mmol/L    Comment: CRITICAL RESULT CALLED TO, READ BACK BY AND VERIFIED WITH: WOOD,A. RN @2053  ON 12.17.19 BY COHEN,K    Chloride 93 (L) 98 - 111 mmol/L   CO2 28 22 - 32 mmol/L   Glucose, Bld 107 (H) 70 - 99 mg/dL   BUN 32 (H) 6 - 20 mg/dL   Creatinine, Ser 1.32 (H) 0.44 - 1.00 mg/dL   Calcium 8.3 (L) 8.9 - 10.3 mg/dL   GFR calc non Af Amer 44 (L) >60 mL/min   GFR calc Af Amer 51 (L) >60 mL/min   Anion gap 12 5 - 15    Comment: Performed at [facility], [place] [address], [place], [place] [address]  Hepatic function panel     Status: Abnormal   Collection Time: 06/09/18  7:37 PM  Result Value Ref Range   Total Protein 6.0 (L) 6.5 - 8.1 g/dL   Albumin 3.5 3.5 - 5.0 g/dL   AST 19 15 - 41 U/L   ALT 23 0 - 44 U/L    Alkaline Phosphatase 57 38 - 126 U/L   Total Bilirubin 1.1 0.3 - 1.2 mg/dL   Bilirubin, Direct 0.3 (H)  0.0 - 0.2 mg/dL   Indirect Bilirubin 0.8 0.3 - 0.9 mg/dL    Comment: Performed at [facility], [place] [address], [place], [place] [address]  Lipase, blood     Status: None   Collection Time: 06/09/18  7:37 PM  Result Value Ref Range   Lipase 31 11 - 51 U/L    Comment: Performed at [facility], [place] [address], [place], [place] [address]  Troponin I - ONCE - STAT     Status: None   Collection Time: 06/09/18  7:37 PM  Result Value Ref Range   Troponin I <0.03 <0.03 ng/mL    Comment: Performed at [facility], [place] [address], [place], [place] [address]  Urinalysis, Routine w reflex microscopic     Status: None   Collection Time: 06/09/18  8:56 PM  Result Value Ref Range   Color, Urine YELLOW YELLOW   APPearance CLEAR CLEAR   Specific Gravity, Urine 1.017 1.005 - 1.030   pH 6.0 5.0 - 8.0   Glucose, UA NEGATIVE NEGATIVE mg/dL   Hgb urine dipstick NEGATIVE NEGATIVE   Bilirubin Urine NEGATIVE NEGATIVE   Ketones, ur NEGATIVE NEGATIVE mg/dL   Protein, ur NEGATIVE NEGATIVE mg/dL   Nitrite NEGATIVE NEGATIVE   Leukocytes, UA NEGATIVE NEGATIVE    Comment: Performed at [name] [address] [name], [place] [address]   Dg Chest 2 View  Result Date: 06/09/2018 CLINICAL DATA:  Weakness and lightheadedness. History of ovarian cancer. EXAM: CHEST - 2 VIEW COMPARISON:  Chest x-ray 10/06/2017 FINDINGS: The pacer wires/AICD wires are stable. The heart is normal in size. Mild tortuosity and calcification of the thoracic aorta. The pulmonary hila appear normal. Right-sided IJ Port-A-Cath tip is in good position. No complicating features. Evidence of prior transcatheter mitral valve replacement. The lungs are clear of an acute process. There are left basilar scarring changes. Numerous calcifications around the left  shoulder likely HADD or synovial osteochondromatosis. IMPRESSION: No acute cardiopulmonary findings. Electronically Signed   By: Marijo Sanes M.D.   On: 06/09/2018 21:01   EKG Interpretation  Date/Time:  Tuesday June 09 2018 19:58:01 EST Ventricular Rate:  95 PR Interval:    QRS Duration: 117 QT Interval:  362 QTC Calculation: 456 R Axis:   89 Text Interpretation:  Sinus rhythm Consider left atrial enlargement Nonspecific intraventricular conduction delay Inferior infarct, age indeterminate Lateral leads are also involved Non-specific intra-ventricular conduction delay New since previous tracing Confirmed by [place], Inocente Salles [phone]) on 06/09/2018 8:20:21 PM   Pending Labs Unresulted Labs (From admission, onward)    Start     Ordered   06/09/18 2005  Blood culture (routine x 2)  BLOOD CULTURE X 2,   STAT     06/09/18 2004   06/09/18 2005  Urine culture  ONCE - STAT,   STAT     06/09/18 2004          Vitals/Pain Today's Vitals   06/09/18 1959 06/09/18 2030 06/09/18 2031 06/09/18 2100  BP: 108/73 97/64 97/64  109/73  Pulse: 98 99 99 (!) 101  Resp: 16 11 14  (!) 26  Temp:      TempSrc:      SpO2: 98% 98% 100% 99%  PainSc:        Isolation Precautions No active isolations  Medications Medications  magnesium sulfate IVPB 2 g 50 mL (has no administration in time range)  potassium chloride 10 mEq in 100 mL IVPB (10 mEq  Intravenous New Bag/Given 06/09/18 2037)  sodium chloride 0.9 % bolus 500 mL (500 mLs Intravenous New Bag/Given 06/09/18 2030)  potassium chloride SA (K-DUR,KLOR-CON) CR tablet 60 mEq (has no administration in time range)    Mobility walks

## 2018-06-09 NOTE — ED Notes (Signed)
Dr R. Smith at bedside. 

## 2018-06-10 ENCOUNTER — Other Ambulatory Visit: Payer: Self-pay

## 2018-06-10 ENCOUNTER — Ambulatory Visit
Admit: 2018-06-10 | Discharge: 2018-06-10 | Disposition: A | Discharge status: Home / Self Care | Payer: BLUE CROSS/BLUE SHIELD | Attending: Radiation Oncology | Admitting: Radiation Oncology

## 2018-06-10 ENCOUNTER — Inpatient Hospital Stay: Payer: BLUE CROSS/BLUE SHIELD

## 2018-06-10 DIAGNOSIS — C569 Malignant neoplasm of unspecified ovary: Secondary | ICD-10-CM | POA: Diagnosis not present

## 2018-06-10 DIAGNOSIS — E860 Dehydration: Secondary | ICD-10-CM | POA: Diagnosis present

## 2018-06-10 DIAGNOSIS — Z7901 Long term (current) use of anticoagulants: Secondary | ICD-10-CM

## 2018-06-10 DIAGNOSIS — E878 Other disorders of electrolyte and fluid balance, not elsewhere classified: Secondary | ICD-10-CM | POA: Diagnosis not present

## 2018-06-10 DIAGNOSIS — T451X5A Adverse effect of antineoplastic and immunosuppressive drugs, initial encounter: Secondary | ICD-10-CM

## 2018-06-10 DIAGNOSIS — R197 Diarrhea, unspecified: Secondary | ICD-10-CM | POA: Diagnosis present

## 2018-06-10 DIAGNOSIS — D6481 Anemia due to antineoplastic chemotherapy: Secondary | ICD-10-CM | POA: Diagnosis present

## 2018-06-10 DIAGNOSIS — D72829 Elevated white blood cell count, unspecified: Secondary | ICD-10-CM | POA: Diagnosis present

## 2018-06-10 LAB — BASIC METABOLIC PANEL
ANION GAP: 9 (ref 5–15)
Anion gap: 10 (ref 5–15)
BUN: 29 mg/dL — ABNORMAL HIGH (ref 6–20)
BUN: 29 mg/dL — ABNORMAL HIGH (ref 6–20)
CO2: 27 mmol/L (ref 22–32)
CO2: 26 mmol/L (ref 22–32)
Calcium: 8.6 mg/dL — ABNORMAL LOW (ref 8.9–10.3)
Calcium: 8.5 mg/dL — ABNORMAL LOW (ref 8.9–10.3)
Chloride: 100 mmol/L (ref 98–111)
Chloride: 101 mmol/L (ref 98–111)
Creatinine, Ser: 1.24 mg/dL — ABNORMAL HIGH (ref 0.44–1.00)
Creatinine, Ser: 1.17 mg/dL — ABNORMAL HIGH (ref 0.44–1.00)
GFR calc Af Amer: 59 mL/min — ABNORMAL LOW (ref >60)
GFR calc non Af Amer: 51 mL/min — ABNORMAL LOW (ref >60)
GFR, EST AFRICAN AMERICAN: 55 mL/min — AB (ref >60)
GFR, EST NON AFRICAN AMERICAN: 48 mL/min — AB (ref >60)
Glucose, Bld: 113 mg/dL — ABNORMAL HIGH (ref 70–99)
Glucose, Bld: 103 mg/dL — ABNORMAL HIGH (ref 70–99)
Potassium: 3.5 mmol/L (ref 3.5–5.1)
Potassium: 3.3 mmol/L — ABNORMAL LOW (ref 3.5–5.1)
Sodium: 137 mmol/L (ref 135–145)
Sodium: 136 mmol/L (ref 135–145)

## 2018-06-10 LAB — CBC
HCT: 30.4 % — ABNORMAL LOW (ref 36.0–46.0)
Hemoglobin: 10.2 g/dL — ABNORMAL LOW (ref 12.0–15.0)
MCH: 31 pg (ref 26.0–34.0)
MCHC: 33.6 g/dL (ref 30.0–36.0)
MCV: 92.4 fL (ref 80.0–100.0)
Platelets: 122 10*3/uL — ABNORMAL LOW (ref 150–400)
RBC: 3.29 MIL/uL — ABNORMAL LOW (ref 3.87–5.11)
RDW: 15.2 % (ref 11.5–15.5)
WBC: 19.9 10*3/uL — ABNORMAL HIGH (ref 4.0–10.5)
nRBC: 0 % (ref 0.0–0.2)

## 2018-06-10 LAB — MAGNESIUM: MAGNESIUM: 1.3 mg/dL — AB (ref 1.7–2.4)

## 2018-06-10 LAB — PROTIME-INR
INR: 3.38
Prothrombin Time: 33.7 seconds — ABNORMAL HIGH (ref 11.4–15.2)

## 2018-06-10 MED ORDER — POTASSIUM CHLORIDE CRYS ER 20 MEQ PO TBCR
20.0000 meq | EXTENDED_RELEASE_TABLET | Freq: Two times a day (BID) | ORAL | Status: DC
  Administered 2018-06-10 – 2018-06-11 (×2): 20 meq via ORAL
  Filled 2018-06-10 (×2): qty 1

## 2018-06-10 MED ORDER — SODIUM CHLORIDE 0.9% FLUSH
10.0000 mL | INTRAVENOUS | Status: DC | PRN
  Administered 2018-06-11: 10 mL
  Filled 2018-06-10: qty 40

## 2018-06-10 MED ORDER — MAGNESIUM OXIDE 400 (241.3 MG) MG PO TABS
400.0000 mg | ORAL_TABLET | Freq: Two times a day (BID) | ORAL | Status: DC
  Administered 2018-06-10 – 2018-06-11 (×2): 400 mg via ORAL
  Filled 2018-06-10 (×2): qty 1

## 2018-06-10 MED ORDER — WARFARIN - PHARMACIST DOSING INPATIENT: Freq: Every day | Status: DC

## 2018-06-10 MED ORDER — POTASSIUM CHLORIDE CRYS ER 20 MEQ PO TBCR
40.0000 meq | EXTENDED_RELEASE_TABLET | Freq: Once | ORAL | Status: AC
  Administered 2018-06-10: 40 meq via ORAL
  Filled 2018-06-10: qty 2

## 2018-06-10 NOTE — Progress Notes (Signed)
ANTICOAGULATION CONSULT NOTE - Initial Consult  Pharmacy Consult for Warfarin Indication: atrial fibrillation  Allergies  Allergen Reactions  . Prochlorperazine Edisylate Other (See Comments)    Nervous/ flutter/ shakes--compazine  . Adhesive [Tape] Other (See Comments)    Redness/skin peeling Redness/hives from adhesive tape( tolerates latex gloves); tolerates paper tape  . Prochlorperazine Other (See Comments)    Jitters, hyper    Patient Measurements:   Heparin Dosing Weight:   Vital Signs: Temp: 97.8 F (36.6 C) (12/17 1828) Temp Source: Oral (12/17 1828) BP: 104/64 (12/18 0436) Pulse Rate: 84 (12/18 0436)  Labs: Recent Labs    06/09/18 1607 06/09/18 1937 06/10/18 0435  HGB 12.3 11.2* 10.2*  HCT 35.6* 33.1* 30.4*  PLT 165 150 122*  LABPROT  --   --  33.7*  INR  --   --  3.38  CREATININE 1.35* 1.32* 1.24*  TROPONINI  --  <0.03  --     Estimated Creatinine Clearance: 54.9 mL/min (A) (by C-G formula based on SCr of 1.24 mg/dL (H)).   Medical History: Past Medical History:  Diagnosis Date  . Arthritis    "everywhere"  . Asthma    as a child  . Automatic implantable cardioverter-defibrillator in situ    DR. Beckie Salts   . Bladder cancer [facility]) 2009   "injected medicine to get rid of it"  . Chronic systolic CHF (congestive heart failure) ([place])   . CKD (chronic kidney disease), stage III ([place])   . Coronary artery disease    a. large RV/inferior MI complicated by pseudoaneurysm s/p aneurysemectomy and CABG 10/2009 with papillary muscle rupture requiring bioprosthetic mitral valve replacement in [facility].  . 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 H/O thymectomy   . High cholesterol   . Hypothyroidism   . ICD (implantable cardiac defibrillator) in place   . Ischemic cardiomyopathy    a.  ICM and VT arrest 10/2009 s/p AICD (revision 01/2012 due to RV lead problem).  . Myocardial infarction ([place])  10/14/2009  . Neuropathy    FEET AND HANDS - FROM CHEMO - BUT MUCH IMPROVED  . PAF (paroxysmal atrial fibrillation) ([place])   . Pain    JOINT PAINS AND MUSCLE ACHES ALL OVER.  Marland Kitchen Port-A-Cath in place    RIGHT UPPER CHEST  . Prosthetic valve dysfunction   . S/P mitral valve replacement with bioprosthetic valve 10/21/2009   Edwards Perimount stented bovine pericardial tissue valve, size 29 mm  . S/P valve-in-valve transcatheter mitral valve replacement 09/16/2017   29 mm Edwards Sapien 3 transcatheter heart valve placed via transapical approach  . SVT (supraventricular tachycardia) ([place])   . Tobacco abuse   . Ventricular tachycardia (HCC)     Medications:  (Not in a hospital admission)  Scheduled:  . aspirin EC  81 mg Oral Daily  . carvedilol  3.125 mg Oral BID WC  . gabapentin  300 mg Oral TID  . OLANZapine  10 mg Oral QHS  . oxyCODONE  20 mg Oral Q12H  . sodium chloride flush  3 mL Intravenous Q12H  . zolpidem  5 mg Oral QHS    Assessment: Patient with chronic use of warfarin for afib.  INR on admit > 3.    Goal of Therapy:  INR 2-3    Plan:  No warfarin today 12-18 Daily INR  Nani Skillern [place] 06/10/2018,6:12 AM

## 2018-06-10 NOTE — ED Notes (Signed)
ED TO INPATIENT HANDOFF REPORT  Name/Age/Gender [name] 57 y.o. female  Code Status    Code Status Orders  (From admission, onward)         Start     Ordered   06/09/18 2213  Full code  Continuous     06/09/18 2215        Code Status History    Date Active Date Inactive Code Status Order ID Comments User Context   05/05/2018 1245 05/15/2018 1947 Full Code [id number]  Shelly Coss, MD Inpatient   05/01/2018 2209 05/02/2018 1705 Full Code [id number]  Vianne Bulls, MD ED   09/16/2017 1657 09/19/2017 1426 Full Code [id number]  Rexene Alberts, MD Inpatient   07/02/2017 1042 07/02/2017 1611 Full Code [id number]  Belva Crome, MD Inpatient   06/03/2017 2242 06/06/2017 1832 Full Code [id number]  Rise Patience, MD Inpatient   05/28/2015 2325 06/01/2015 1929 Full Code [id number]  Rise Patience, MD Inpatient   01/19/2013 2103 01/25/2013 1636 Full Code [id number]  Lahoma Crocker, MD Inpatient   05/06/2012 1355 05/08/2012 1443 Full Code [id number]  Denton Meek, RN Inpatient    Advance Directive Documentation     Most Recent Value  Type of [place] Directive  Healthcare Power of Attorney, Living will  Pre-existing out of facility DNR order (yellow form or pink MOST form)  -  "MOST" Form in Place?  -      Home/SNF/Other Home  Chief Complaint Fluids   Level of Care/Admitting Diagnosis ED Disposition    ED Disposition Condition [place]: [name] [100102]  Level of Care: Telemetry [5]  Admit to tele based on following criteria: Complex arrhythmia (Bradycardia/Tachycardia)  Diagnosis: Electrolyte depletion [671245]  Admitting Physician: Norval Morton [[id number]]  Attending Physician: Norval Morton [[id number]]  PT Class (Do Not Modify): Observation [104]  PT Acc Code (Do Not Modify): Observation [10022]       Medical History Past Medical History:  Diagnosis Date  . Arthritis    "everywhere"  . Asthma    as a  child  . Automatic implantable cardioverter-defibrillator in situ    DR. Beckie Salts   . Bladder cancer [facility]) 2009   "injected medicine to get rid of it"  . Chronic systolic CHF (congestive heart failure) ([place])   . CKD (chronic kidney disease), stage III ([place])   . Coronary artery disease    a. large RV/inferior MI complicated by pseudoaneurysm s/p aneurysemectomy and CABG 10/2009 with papillary muscle rupture requiring bioprosthetic mitral valve [facility] in [facility]  . 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 H/O thymectomy   . High cholesterol   . Hypothyroidism   . ICD (implantable cardiac defibrillator) in place   . Ischemic cardiomyopathy    a.  ICM and VT arrest 10/2009 s/p AICD (revision 01/2012 due to RV lead problem).  . Myocardial infarction ([place]) 10/14/2009  . Neuropathy    FEET AND HANDS - FROM CHEMO - BUT MUCH IMPROVED  . PAF (paroxysmal atrial fibrillation) ([place])   . Pain    JOINT PAINS AND MUSCLE ACHES ALL OVER.  Marland Kitchen Port-A-Cath in place    RIGHT UPPER CHEST  . Prosthetic valve dysfunction   . S/P mitral valve replacement with bioprosthetic valve 10/21/2009   Edwards Perimount stented bovine pericardial tissue valve, size 29 mm  . S/P valve-in-valve transcatheter mitral  valve replacement 09/16/2017   29 mm Edwards Sapien 3 transcatheter heart valve placed via transapical approach  . SVT (supraventricular tachycardia) ([place])   . Tobacco abuse   . Ventricular tachycardia (H[facility])     Allergies Allergies  Allergen Reactions  . Prochlorperazine Edisylate Other (See Comments)    Nervous/ flutter/ shakes--compazine  . Adhesive [Tape] Other (See Comments)    Redness/skin peeling Redness/hives from adhesive tape( tolerates latex gloves); tolerates paper tape  . Prochlorperazine Other (See Comments)    Jitters, hyper    IV Location/Drains/Wounds Patient Lines/Drains/Airways Status   Active Line/Drains/Airways     Name:   Placement date:   Placement time:   Site:   Days:   Implanted Port 05/11/18 Right Chest   05/11/18    1116    Chest   30          Labs/Imaging Results for orders placed or performed during the hospital encounter of 06/09/18 (from the past 48 hour(s))  CBC with Differential     Status: Abnormal   Collection Time: 06/09/18  7:37 PM  Result Value Ref Range   WBC 26.4 (H) 4.0 - 10.5 K/uL   RBC 3.62 (L) 3.87 - 5.11 MIL/uL   Hemoglobin 11.2 (L) 12.0 - 15.0 g/dL   HCT 33.1 (L) 36.0 - 46.0 %   MCV 91.4 80.0 - 100.0 fL   MCH 30.9 26.0 - 34.0 pg   MCHC 33.8 30.0 - 36.0 g/dL   RDW 15.0 11.5 - 15.5 %   Platelets 150 150 - 400 K/uL   nRBC 0.0 0.0 - 0.2 %   Neutrophils Relative % 95 %   Lymphocytes Relative 2 %   Monocytes Relative 2 %   Eosinophils Relative 1 %   Basophils Relative 0 %   Band Neutrophils 0 %   Metamyelocytes Relative 0 %   Myelocytes 0 %   Promyelocytes Relative 0 %   Blasts 0 %   nRBC 0 0 /100 WBC   Other 0 %   Neutro Abs 25.1 (H) 1.7 - 7.7 K/uL   Lymphs Abs 0.5 (L) 0.7 - 4.0 K/uL   Monocytes Absolute 0.5 0.1 - 1.0 K/uL   Eosinophils Absolute 0.3 0.0 - 0.5 K/uL   Basophils Absolute 0.0 0.0 - 0.1 K/uL   WBC Morphology WHITE COUNT CONFIRMED ON SMEAR     Comment: Performed at [facility], [place] [address], [place], [place] [address]  Basic metabolic panel     Status: Abnormal   Collection Time: 06/09/18  7:37 PM  Result Value Ref Range   Sodium 133 (L) 135 - 145 mmol/L   Potassium 2.7 (LL) 3.5 - 5.1 mmol/L    Comment: CRITICAL RESULT CALLED TO, READ BACK BY AND VERIFIED WITH: WOOD,A. RN @2053  ON 12.17.19 BY COHEN,K    Chloride 93 (L) 98 - 111 mmol/L   CO2 28 22 - 32 mmol/L   Glucose, Bld 107 (H) 70 - 99 mg/dL   BUN 32 (H) 6 - 20 mg/dL   Creatinine, Ser 1.32 (H) 0.44 - 1.00 mg/dL   Calcium 8.3 (L) 8.9 - 10.3 mg/dL   GFR calc non Af Amer 44 (L) >60 mL/min   GFR calc Af Amer 51 (L) >60 mL/min   Anion gap 12 5 - 15    Comment:  Performed at [facility], [place] [address], [place], [place] [address]  Hepatic function panel     Status: Abnormal   Collection Time: 06/09/18  7:37 PM  Result Value Ref Range   Total Protein 6.0 (L) 6.5 - 8.1 g/dL   Albumin 3.5 3.5 - 5.0 g/dL   AST 19 15 - 41 U/L   ALT 23 0 - 44 U/L   Alkaline Phosphatase 57 38 - 126 U/L   Total Bilirubin 1.1 0.3 - 1.2 mg/dL   Bilirubin, Direct 0.3 (H) 0.0 - 0.2 mg/dL   Indirect Bilirubin 0.8 0.3 - 0.9 mg/dL    Comment: Performed at [facility], [place] [address], [place], [place] [address]  Lipase, blood     Status: None   Collection Time: 06/09/18  7:37 PM  Result Value Ref Range   Lipase 31 11 - 51 U/L    Comment: Performed at [facility], [place] [address], [place], [place] [address]  Troponin I - ONCE - STAT     Status: None   Collection Time: 06/09/18  7:37 PM  Result Value Ref Range   Troponin I <0.03 <0.03 ng/mL    Comment: Performed at [facility], [place] [address], [place], [place] [address]  Blood culture (routine x 2)     Status: None (Preliminary result)   Collection Time: 06/09/18  8:10 PM  Result Value Ref Range   Specimen Description      BLOOD PORTA CATH Performed at [place] [address] [place], [place] [address]    Special Requests      BOTTLES DRAWN AEROBIC AND ANAEROBIC Blood Culture adequate volume Performed at [place] [address] [place], [place] [address]    Culture      NO GROWTH < 12 HOURS Performed at [place] [address] [place], [place] [address]    Report Status PENDING   Blood culture (routine x 2)     Status: None (Preliminary result)   Collection Time: 06/09/18  8:14 PM  Result Value Ref Range   Specimen Description      BLOOD RIGHT HAND Performed at [place] [address] [place], [place] [address]    Special Requests      BOTTLES DRAWN  AEROBIC AND ANAEROBIC Blood Culture adequate volume Performed at [place] [address], [place], [place] [address]    Culture      NO GROWTH < 12 HOURS Performed at [place] [address] [place], [name] [address]    Report Status PENDING   Urinalysis, Routine w reflex microscopic     Status: None   Collection Time: 06/09/18  8:56 PM  Result Value Ref Range   Color, Urine YELLOW YELLOW   APPearance CLEAR CLEAR   Specific Gravity, Urine 1.017 1.005 - 1.030   pH 6.0 5.0 - 8.0   Glucose, UA NEGATIVE NEGATIVE mg/dL   Hgb urine dipstick NEGATIVE NEGATIVE   Bilirubin Urine NEGATIVE NEGATIVE   Ketones, ur NEGATIVE NEGATIVE mg/dL   Protein, ur NEGATIVE NEGATIVE mg/dL   Nitrite NEGATIVE NEGATIVE   Leukocytes, UA NEGATIVE NEGATIVE    Comment: Performed at [facility], [place] [address], [place], [place] [address]  Magnesium     Status: Abnormal   Collection Time: 06/10/18  4:35 AM  Result Value Ref Range   Magnesium 1.3 (L) 1.7 - 2.4 mg/dL    Comment: Performed at [facility], [place] [address] [place], [place] [address]  CBC     Status: Abnormal  Collection Time: 06/10/18  4:35 AM  Result Value Ref Range   WBC 19.9 (H) 4.0 - 10.5 K/uL   RBC 3.29 (L) 3.87 - 5.11 MIL/uL   Hemoglobin 10.2 (L) 12.0 - 15.0 g/dL   HCT 30.4 (L) 36.0 - 46.0 %   MCV 92.4 80.0 - 100.0 fL   MCH 31.0 26.0 - 34.0 pg   MCHC 33.6 30.0 - 36.0 g/dL   RDW 15.2 11.5 - 15.5 %   Platelets 122 (L) 150 - 400 K/uL    Comment: Immature Platelet Fraction may be clinically indicated, consider ordering this additional test [id number]    nRBC 0.0 0.0 - 0.2 %    Comment: Performed at [facility], [place], [place] [address], [place], [place] [address]  Basic metabolic panel     Status: Abnormal   Collection Time: 06/10/18  4:35 AM  Result Value Ref Range   Sodium 137 135 - 145 mmol/L   Potassium 3.5 3.5 - 5.1 mmol/L    Comment: REPEATED TO  VERIFY DELTA CHECK NOTED NO VISIBLE HEMOLYSIS    Chloride 100 98 - 111 mmol/L   CO2 27 22 - 32 mmol/L   Glucose, Bld 113 (H) 70 - 99 mg/dL   BUN 29 (H) 6 - 20 mg/dL   Creatinine, Ser 1.24 (H) 0.44 - 1.00 mg/dL   Calcium 8.6 (L) 8.9 - 10.3 mg/dL   GFR calc non Af Amer 48 (L) >60 mL/min   GFR calc Af Amer 55 (L) >60 mL/min   Anion gap 10 5 - 15    Comment: Performed at [facility], [place] [address] [place], [name] [address]  Protime-INR     Status: Abnormal   Collection Time: 06/10/18  4:35 AM  Result Value Ref Range   Prothrombin Time 33.7 (H) 11.4 - 15.2 seconds   INR 3.38     Comment: Performed at [facility], [place] [address], [place], [place] [address]  Basic metabolic panel     Status: Abnormal   Collection Time: 06/10/18  9:51 AM  Result Value Ref Range   Sodium 136 135 - 145 mmol/L   Potassium 3.3 (L) 3.5 - 5.1 mmol/L   Chloride 101 98 - 111 mmol/L   CO2 26 22 - 32 mmol/L   Glucose, Bld 103 (H) 70 - 99 mg/dL   BUN 29 (H) 6 - 20 mg/dL   Creatinine, Ser 1.17 (H) 0.44 - 1.00 mg/dL   Calcium 8.5 (L) 8.9 - 10.3 mg/dL   GFR calc non Af Amer 51 (L) >60 mL/min   GFR calc Af Amer 59 (L) >60 mL/min   Anion gap 9 5 - 15    Comment: Performed at [facility], [place] [address] [place], [place] [address]   Dg Chest 2 View  Result Date: 06/09/2018 CLINICAL DATA:  Weakness and lightheadedness. History of ovarian cancer. EXAM: CHEST - 2 VIEW COMPARISON:  Chest x-ray 10/06/2017 FINDINGS: The pacer wires/AICD wires are stable. The heart is normal in size. Mild tortuosity and calcification of the thoracic aorta. The pulmonary hila appear normal. Right-sided IJ Port-A-Cath tip is in good position. No complicating features. Evidence of prior transcatheter mitral valve replacement. The lungs are clear of an acute process. There are left basilar scarring changes. Numerous calcifications around the left shoulder likely HADD or synovial  osteochondromatosis. IMPRESSION: No acute cardiopulmonary findings. Electronically Signed   By: Marijo Sanes M.D.   On: 06/09/2018 21:01   EKG  Interpretation  Date/Time:  Tuesday June 09 2018 21:18:51 EST Ventricular Rate:  97 PR Interval:    QRS Duration: 117 QT Interval:  355 QTC Calculation: 451 R Axis:   94 Text Interpretation:  Sinus rhythm Nonspecific intraventricular conduction delay Inferior infarct, age indeterminate When compared with ECG of EARLIER SAME DATE No significant change was found Confirmed by Delora Fuel ([id number]) on 06/10/2018 7:16:34 AM   Pending Labs Unresulted Labs (From admission, onward)    Start     Ordered   06/10/18 0500  Protime-INR  Daily,   R     06/09/18 2215   06/09/18 2005  Urine culture  ONCE - STAT,   STAT     06/09/18 2004          Vitals/Pain Today's Vitals   06/10/18 0437 06/10/18 0646 06/10/18 0730 06/10/18 1103  BP:  (!) 93/57 (!) 100/57 98/63  Pulse:  88 100 85  Resp:  15 16 17   Temp:      TempSrc:      SpO2:  96% 98% 98%  PainSc: 0-No pain       Isolation Precautions No active isolations  Medications Medications  LORazepam (ATIVAN) tablet 0.5-1 mg (1 mg Oral Given 06/10/18 0737)  magic mouthwash w/lidocaine (has no administration in time range)  lidocaine-prilocaine (EMLA) cream 1 application (has no administration in time range)  methocarbamol (ROBAXIN) tablet 500 mg (has no administration in time range)  gabapentin (NEURONTIN) capsule 300 mg (300 mg Oral Given 06/10/18 0944)  HYDROmorphone (DILAUDID) tablet 2 mg (has no administration in time range)  oxyCODONE (OXYCONTIN) 12 hr tablet 20 mg (20 mg Oral Given 06/10/18 0944)  carvedilol (COREG) tablet 3.125 mg (3.125 mg Oral Given 06/10/18 0726)  OLANZapine (ZYPREXA) tablet 10 mg (10 mg Oral Refused 06/09/18 2358)  zolpidem (AMBIEN) tablet 5 mg (5 mg Oral Given 06/09/18 2357)  aspirin EC tablet 81 mg (81 mg Oral Given 06/10/18 0945)  sodium chloride flush (NS) 0.9  % injection 3 mL (0 mLs Intravenous Hold 06/10/18 1005)  0.9 %  sodium chloride infusion ( Intravenous Stopped 06/10/18 1104)  acetaminophen (TYLENOL) tablet 650 mg (has no administration in time range)    Or  acetaminophen (TYLENOL) suppository 650 mg (has no administration in time range)  ondansetron (ZOFRAN) tablet 4 mg ( Oral See Alternative 06/10/18 0216)    Or  ondansetron (ZOFRAN) injection 4 mg (4 mg Intravenous Given 06/10/18 0216)  ipratropium-albuterol (DUONEB) 0.5-2.5 (3) MG/3ML nebulizer solution 3 mL (has no administration in time range)  loperamide (IMODIUM) capsule 2 mg (2 mg Oral Given 06/10/18 0944)  magnesium sulfate IVPB 2 g 50 mL (0 g Intravenous Stopped 06/09/18 2244)  potassium chloride 10 mEq in 100 mL IVPB (0 mEq Intravenous Stopped 06/09/18 2139)  sodium chloride 0.9 % bolus 500 mL (0 mLs Intravenous Stopped 06/09/18 2139)  potassium chloride SA (K-DUR,KLOR-CON) CR tablet 60 mEq (60 mEq Oral Given 06/09/18 2140)  potassium chloride 10 mEq in 100 mL IVPB (0 mEq Intravenous Stopped 06/09/18 2320)  calcium gluconate 1 g/ 50 mL sodium chloride IVPB (0 g Intravenous Stopped 06/10/18 0023)  potassium chloride SA (K-DUR,KLOR-CON) CR tablet 40 mEq (40 mEq Oral Given 06/10/18 0943)    Mobility walks

## 2018-06-10 NOTE — [place] Notes (Signed)
Patient ID: [name], female   DOB: 06/23/1961, 57 y.o.   MRN: [id number]  PROGRESS NOTE    [name]  AYT:[id number] DOB: 1960/10/02 DOA: 06/09/2018 PCP: Default, Provider, MD   Brief Narrative:  [name] is a 57 y.o. 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  Patient reports that she just received chemotherapy on 12/13 and had an infusion of pegfilgrastim yesterday.  However this weekend patient reported feeling lightheaded as though she may pass out and very weak.  Her husband had to push her around a wheelchair due to her symptoms.  Patient reports that she almost passed out yesterday while going to bathroom falling to her knees. She did not hit her head or lose consciousness, but sustained a bruise to her left knee.  Patient also notes that she had these intermittent pains on the right side of her breast that felt like air bubble lasting only 15 seconds.  Other associated symptoms include poor p.o. intake, mild dysuria, and several episodes of nonbloody diarrhea over the last 48 hours.  Patient notes that she has held off on her diuretics over the last week due to her symptoms.  Patient admitted under work-up continuing with replacement of electrolytes.   Assessment & Plan:   Principal Problem:   Electrolyte depletion Active Problems:   Chronic systolic CHF (congestive heart failure) (HCC)   S/P valve-in-valve transcatheter mitral valve replacement   Hypokalemia   Dehydration   Leukocytosis (leucocytosis)   Diarrhea   Hypomagnesemia   Chronic anticoagulation   Anemia associated with chemotherapy   #1 hypokalemia: Partly due to hypomagnesemia and generalized dehydration.  Continue to replete.  Potassium was 3.5 but dropped further down.  Discharge when potassium is repleted  #2  hypomagnesemia: magnesium has been repleted in the ER.  Recheck in the morning and follow  #3 hypocalcemia: Most likely related to poor oral intake also.  Albumin appears okay.  Calcium gluconate given in the ER.  #4 acute kidney injury: Secondary to dehydration.  Improved.  Continue aggressive hydration  #5 leukocytosis: Most likely due to dehydration and demargination.  Continue monitoring.  #6 granulosa cell tumor of the ovary: Patient scheduled for radiation today.  She is stable enough for [place] to continue with radiation.  #7 diarrheal disease: Patient has had persistent diarrhea including this morning.  At this point we will continue with supportive care.  Imodium as needed.  Does not appear to be infectious.  #8 anemia of chronic disease: Continue to monitor H&H.  #9 paroxysmal atrial fibrillation: Currently on warfarin.  Continue  DVT prophylaxis: Warfarin Code Status: Full code Family Communication: Patient and husband Disposition Plan: Home  Consultants:   None  Procedures:   None  Antimicrobials:   None   Subjective: Patient appears weak.  Still having diarrhea at this morning.  Otherwise generally stable.  Objective: Vitals:   06/10/18 0435 06/10/18 0436 06/10/18 0646 06/10/18 0730  BP: 104/64 104/64 (!) 93/57 (!) 100/57  Pulse:  84 88 100  Resp:  (!) 21 15 16   Temp:      TempSrc:      SpO2:  97% 96% 98%   No intake or output data in the 24 hours ending 06/10/18 0836 There were no vitals filed for this visit.  Examination:  General exam: Appears calm and comfortable  Respiratory system: Clear to auscultation. Respiratory effort normal. Cardiovascular system: S1 & S2 heard, RRR. No JVD, murmurs, rubs, gallops or clicks. No pedal edema. Gastrointestinal system: Abdomen is nondistended, soft and nontender. No organomegaly or masses felt. Normal bowel sounds heard. Central nervous system: Alert and oriented. No focal neurological deficits. Extremities:  Symmetric 5 x 5 power. Skin: No rashes, lesions or ulcers Psychiatry: Judgement and insight appear normal. Mood & affect appropriate.     Data Reviewed: I have personally reviewed following labs and imaging studies  CBC: Recent Labs  Lab 06/09/18 1607 06/09/18 1937 06/10/18 0435  WBC 30.2* 26.4* 19.9*  NEUTROABS 29.3* 25.1*  --   HGB 12.3 11.2* 10.2*  HCT 35.6* 33.1* 30.4*  MCV 88.1 91.4 92.4  PLT 165 150 947*   Basic Metabolic Panel: Recent Labs  Lab 06/09/18 1607 06/09/18 1937 06/10/18 0435  NA 135 133* 137  K 3.0* 2.7* 3.5  CL 94* 93* 100  CO2 28 28 27   GLUCOSE 115* 107* 113*  BUN 30* 32* 29*  CREATININE 1.35* 1.32* 1.24*  CALCIUM 9.1 8.3* 8.6*  MG 1.0*  --  1.3*   GFR: Estimated Creatinine Clearance: 54.9 mL/min (A) (by C-G formula based on SCr of 1.24 mg/dL (H)). Liver Function Tests: Recent Labs  Lab 06/09/18 1607 06/09/18 1937  AST 16 19  ALT 25 23  ALKPHOS 77 57  BILITOT 1.0 1.1  PROT 6.5 6.0*  ALBUMIN 3.4* 3.5   Recent Labs  Lab 06/09/18 1937  LIPASE 31   No results for input(s): AMMONIA in the last 168 hours. Coagulation Profile: Recent Labs  Lab 06/10/18 0435  INR 3.38   Cardiac Enzymes: Recent Labs  Lab 06/09/18 1937  TROPONINI <0.03   BNP (last 3 results) No results for input(s): PROBNP in the last 8760 hours. HbA1C: No results for input(s): HGBA1C in the last 72 hours. CBG: No results for input(s): GLUCAP in the last 168 hours. Lipid Profile: No results for input(s): CHOL, HDL, LDLCALC, TRIG, CHOLHDL, LDLDIRECT in the last 72 hours. Thyroid Function Tests: No results for input(s): TSH, T4TOTAL, FREET4, T3FREE, THYROIDAB in the last 72 hours. Anemia Panel: No results for input(s): VITAMINB12, FOLATE, FERRITIN, TIBC, IRON, RETICCTPCT in the last 72 hours. Sepsis Labs: No results for input(s): PROCALCITON, LATICACIDVEN in the last 168 hours.  Recent Results (from the past 240 hour(s))  Blood culture (routine x 2)      Status: None (Preliminary result)   Collection Time: 06/09/18  8:10 PM  Result Value Ref Range Status   Specimen Description   Final    BLOOD PORTA CATH Performed at [place] [address] [place], [place] [address]    Special Requests   Final    BOTTLES DRAWN AEROBIC AND ANAEROBIC Blood Culture adequate volume Performed at [place] [address] [place], [place] [address]    Culture   Final    NO GROWTH < 12 HOURS Performed at [place] [address], [place], [place] [address]    Report Status PENDING  Incomplete  Blood culture (routine x 2)     Status: None (Preliminary result)   Collection Time: 06/09/18  8:14 PM  Result Value Ref Range Status   Specimen Description   Final    BLOOD RIGHT HAND Performed at [place] [address] [place], [place] [address]    Special Requests   Final    BOTTLES DRAWN AEROBIC AND  ANAEROBIC Blood Culture adequate volume Performed at [place] [address] [place], [place] [address]    Culture   Final    NO GROWTH < 12 HOURS Performed at [place] [address], [place], [place] [address]    Report Status PENDING  Incomplete         Radiology Studies: Dg Chest 2 View  Result Date: 06/09/2018 CLINICAL DATA:  Weakness and lightheadedness. History of ovarian cancer. EXAM: CHEST - 2 VIEW COMPARISON:  Chest x-ray 10/06/2017 FINDINGS: The pacer wires/AICD wires are stable. The heart is normal in size. Mild tortuosity and calcification of the thoracic aorta. The pulmonary hila appear normal. Right-sided IJ Port-A-Cath tip is in good position. No complicating features. Evidence of prior transcatheter mitral valve replacement. The lungs are clear of an acute process. There are left basilar scarring changes. Numerous calcifications around the left shoulder likely HADD or synovial osteochondromatosis. IMPRESSION: No acute  cardiopulmonary findings. Electronically Signed   By: Marijo Sanes M.D.   On: 06/09/2018 21:01        Scheduled Meds: . aspirin EC  81 mg Oral Daily  . carvedilol  3.125 mg Oral BID WC  . gabapentin  300 mg Oral TID  . OLANZapine  10 mg Oral QHS  . oxyCODONE  20 mg Oral Q12H  . sodium chloride flush  3 mL Intravenous Q12H  . zolpidem  5 mg Oral QHS   Continuous Infusions: . sodium chloride 75 mL/hr at 06/10/18 0004     LOS: 0 days    Time spent: 61 minute    Rea Ritter,LAWAL, MD [facility] Pager [phone] If 7PM-7AM, please contact night-coverage [email or web address] Password [facility] 06/10/2018, 8:36 AM

## 2018-06-11 ENCOUNTER — Ambulatory Visit (INDEPENDENT_AMBULATORY_CARE_PROVIDER_SITE_OTHER): Payer: BLUE CROSS/BLUE SHIELD

## 2018-06-11 ENCOUNTER — Ambulatory Visit: Payer: BLUE CROSS/BLUE SHIELD

## 2018-06-11 ENCOUNTER — Ambulatory Visit
Admit: 2018-06-11 | Discharge: 2018-06-11 | Disposition: A | Discharge status: Home / Self Care | Payer: BLUE CROSS/BLUE SHIELD | Attending: Radiation Oncology | Admitting: Radiation Oncology

## 2018-06-11 ENCOUNTER — Telehealth: Payer: Self-pay

## 2018-06-11 DIAGNOSIS — I472 Ventricular tachycardia: Secondary | ICD-10-CM | POA: Diagnosis not present

## 2018-06-11 DIAGNOSIS — C569 Malignant neoplasm of unspecified ovary: Secondary | ICD-10-CM | POA: Diagnosis not present

## 2018-06-11 DIAGNOSIS — E878 Other disorders of electrolyte and fluid balance, not elsewhere classified: Secondary | ICD-10-CM | POA: Diagnosis not present

## 2018-06-11 DIAGNOSIS — I4729 Other ventricular tachycardia: Secondary | ICD-10-CM

## 2018-06-11 LAB — CBC WITH DIFFERENTIAL/PLATELET
Abs Immature Granulocytes: 1.24 10*3/uL — ABNORMAL HIGH (ref 0.00–0.07)
Basophils Absolute: 0 10*3/uL (ref 0.0–0.1)
Basophils Relative: 0 %
Eosinophils Absolute: 0.2 10*3/uL (ref 0.0–0.5)
Eosinophils Relative: 3 %
HCT: 28.3 % — ABNORMAL LOW (ref 36.0–46.0)
Hemoglobin: 9.2 g/dL — ABNORMAL LOW (ref 12.0–15.0)
IMMATURE GRANULOCYTES: 15 %
Lymphocytes Relative: 5 %
Lymphs Abs: 0.4 10*3/uL — ABNORMAL LOW (ref 0.7–4.0)
MCH: 30.6 pg (ref 26.0–34.0)
MCHC: 32.5 g/dL (ref 30.0–36.0)
MCV: 94 fL (ref 80.0–100.0)
Monocytes Absolute: 0.1 10*3/uL (ref 0.1–1.0)
Monocytes Relative: 1 %
NEUTROS PCT: 76 %
NRBC: 0 % (ref 0.0–0.2)
Neutro Abs: 6.4 10*3/uL (ref 1.7–7.7)
Platelets: 75 10*3/uL — ABNORMAL LOW (ref 150–400)
RBC: 3.01 MIL/uL — ABNORMAL LOW (ref 3.87–5.11)
RDW: 15.1 % (ref 11.5–15.5)
WBC: 8.4 10*3/uL (ref 4.0–10.5)

## 2018-06-11 LAB — BASIC METABOLIC PANEL
Anion gap: 9 (ref 5–15)
BUN: 23 mg/dL — ABNORMAL HIGH (ref 6–20)
CALCIUM: 8.2 mg/dL — AB (ref 8.9–10.3)
CO2: 25 mmol/L (ref 22–32)
CREATININE: 1.07 mg/dL — AB (ref 0.44–1.00)
Chloride: 102 mmol/L (ref 98–111)
GFR calc non Af Amer: 57 mL/min — ABNORMAL LOW (ref >60)
Glucose, Bld: 101 mg/dL — ABNORMAL HIGH (ref 70–99)
Potassium: 3.2 mmol/L — ABNORMAL LOW (ref 3.5–5.1)
Sodium: 136 mmol/L (ref 135–145)

## 2018-06-11 LAB — PROTIME-INR
INR: 2.56
Prothrombin Time: 27.1 seconds — ABNORMAL HIGH (ref 11.4–15.2)

## 2018-06-11 LAB — URINE CULTURE: Culture: NO GROWTH

## 2018-06-11 LAB — MAGNESIUM: MAGNESIUM: 1.2 mg/dL — AB (ref 1.7–2.4)

## 2018-06-11 LAB — ALBUMIN: Albumin: 3.1 g/dL — ABNORMAL LOW (ref 3.5–5.0)

## 2018-06-11 MED ORDER — POTASSIUM CHLORIDE CRYS ER 20 MEQ PO TBCR: 20.0000 meq | EXTENDED_RELEASE_TABLET | Freq: Every day | ORAL | 0 refills | Status: DC

## 2018-06-11 MED ORDER — POTASSIUM CHLORIDE CRYS ER 20 MEQ PO TBCR
40.0000 meq | EXTENDED_RELEASE_TABLET | Freq: Once | ORAL | Status: AC
  Administered 2018-06-11: 40 meq via ORAL
  Filled 2018-06-11: qty 2

## 2018-06-11 MED ORDER — MAGNESIUM SULFATE 50 % IJ SOLN
6.0000 g | Freq: Once | INTRAVENOUS | Status: AC
  Administered 2018-06-11: 6 g via INTRAVENOUS
  Filled 2018-06-11: qty 10

## 2018-06-11 MED ORDER — HEPARIN SOD (PORK) LOCK FLUSH 100 UNIT/ML IV SOLN: 500.0000 [IU] | INTRAVENOUS | Status: DC | PRN

## 2018-06-11 MED ORDER — WARFARIN SODIUM 5 MG PO TABS: 7.5000 mg | ORAL_TABLET | Freq: Once | ORAL | Status: DC

## 2018-06-11 MED ORDER — MAGNESIUM OXIDE 400 (241.3 MG) MG PO TABS: 400.0000 mg | ORAL_TABLET | Freq: Two times a day (BID) | ORAL | 0 refills | Status: AC

## 2018-06-11 NOTE — Telephone Encounter (Signed)
LMOVM reminding pt to send remote transmission.   

## 2018-06-11 NOTE — Discharge Summary (Signed)
**Note De-Identified vi Obfusction** Physicin Dischrge [facility]  [name] UXN:[id number] DOB: 1961/03/16 DOA: 06/09/2018  PCP: Defult, Provider, MD  Admit dte: 06/09/2018 Dischrge dte: 06/11/2018  Time spent: 35 minutes  Recommendtions for Outptient Follow-up:  1. Follow up outptient CBC/CMP/Mgnesium 2. Follow up need for continued mgnesium nd potssium supplementtion 3. Follow up INR s outptient s well s wrfrin dosing.  Discussed need for follow up with pt nd sent messge to crds. 4. Aspirin stopped with thrombocytopeni.  Wrfrin hs been continued for now, consider d/c this if thrombocytopeni worsens. 5. Follow up volume sttus/cretinine/electrolyes.  Pt holding lsix/spironolctone t dischrge.   Dischrge Dignoses:  Principl Problem:   Electrolyte depletion Active Problems:   Chronic systolic CHF (congestive hert filure) (HCC)   S/P vlve-in-vlve trnsctheter mitrl vlve replcement   Hypoklemi   Dehydrtion   Leukocytosis (leucocytosis)   Dirrhe   Hypomgnesemi   Chronic nticogultion   Anemi ssocited with chemotherpy   Dischrge Condition: stble  Diet recommendtion: hert helthy  Filed Weights   06/10/18 1752  Weight: 86.1 kg    History of present illness:  Per HPI [name] is  57 y.o. femle with medicl history significnt of grnulos cell tumor of ovry currently receiving chemotherpy nd rdition, systolic CHF, PAF on Coumdin, SVT s/p AICD, s/p bovine MVR; who presents with complints lighthededness.  Ptient reports tht she just received chemotherpy on 12/13 nd hd n infusion of pegfilgrstim yesterdy.  However this weekend ptient reported feeling lightheded s though she my pss out nd very wek.  Her husbnd hd to push her round  wheelchir due to her symptoms.  Ptient reports tht she lmost pssed out yesterdy while going to bthroom flling to her knees. She did not hit her hed or lose consciousness, but sustined   bruise to her left knee.  Ptient lso notes tht she hd these intermittent pins on the right side of her brest tht felt like ir bubble lsting only 15 seconds.  Other ssocited symptoms include poor p.o. intke, mild dysuri, nd severl episodes of nonbloody dirrhe over the lst 48 hours.  Ptient notes tht she hs held off on her diuretics over the lst week due to her symptoms.  Denies hving ny chest pin, shortness of breth, fever, chills, or plpittions.  She ws seen by her oncologist this morning due to her symptoms nd blood work ws checked.  She ws notified tht her electrolytes mgnesium nd potssium were significntly low nd tht she should come to the hospitl for further evlution.  She ws dmitted for generl mlise, dirrhe, nd electrolyte bnormlities.  She hd cute kidney injury s well.  She improved with IVF.  She desired dischrge on 12/19.  She still hd significnt electrolyte bnormlities on 12/19, but these were ggressively replced nd she ws dischrged with plns for follow up outptient lbs nd to continue her orl supplementtion.  She ws instructed to discontinue her spirin due to thrombocytopeni.  She'll follow up with Dr. Mrin Olp next week.  See below for dditionl detils  Hospitl Course:   Hypoklemi: improved on dischrge, continue 20 meq dily t dischrge  Hypomgnesi: Still very low t dischrge to 1.2.  Aggressively replced with 6 g IV.  Continue PO mg t dischrge. Follow up s outptient.   Hypoclcemi:  improved, corrects with lbumin  Dehydrtion with renl insufficiency: Improved on dischrge.  Instructed to hold lsix/spironolctone t dischrge.      Leukocytosis: Acute.  WBC elevted t 26.4.  Urinlysis did not **Note De-Identified vi Obfusction** show ny cler signs of infection.  Chest x-ry otherwise noted to be cler.  Ptient recently given pegfilgrstim nd steroids s possible cuses nd ppers to be trending downwrd. -  resolved  Systolic CHF: Appers euvolemic t dischrge.  Lst echocrdiogrm in 10/2017 noted EF to be round 45%. - continue to hold lsix/spironolctone, follow up outptient  Dirrhe: seems slightly improved, continue to follow outptient  Proxysml tril fibrilltion on chronic nticogultion, history of mitrl vlve replcement: Ptient with previous history of trnsctheter bovine mitrl vlve replcement.  Currently noted to be in sinus rhythm.  CHA2DS2-VASc score = 2.  Ptient reports tking Coumdin s bove.  Lst INR noted to be 2.47 on 12/10. - stop spirin given low pltelets - discussed with Dr. Mrin Olp, ok with continuing wrfrin t this time, he'll follow up with her next week  Anemi ssocited with chemotherpy:  No si/sx bleeding.  Continue to follow.  Thrombocytopeni: likely 2/2 chemo, discussed with Dr. Mrin Olp who will follow her next week.  Stop spirin.  Ok to continue wrfrin.  Grnulos cell tumor of the ovry: Ptient currently receiving chemotherpy nd rdition followed by Dr. Mrin Olp. - follow up with Dr. Mrin Olp  History of ventriculr tchycrdi s/p pcemker  Anxiety - Continue Ativn s needed  Procedures:  none   Consulttions:  none  Dischrge Exm: Vitls:   06/10/18 2047 06/11/18 0500  BP: (!) 93/50 90/78  Pulse: 86 76  Resp: 16 12  Temp: 98.1 F (36.7 C) 98.2 F (36.8 C)  SpO2: 100% 97%   Feeling better. Asking to go home.  Generl: No cute distress. Crdiovsculr: Hert sounds show  regulr rte, nd rhythm.  Lungs: Cler to usculttion bilterlly  Abdomen: Soft, nontender, nondistended. Neurologicl: Alert nd oriented 3. Moves ll extremities 4. Crnil nerves II through XII grossly intct. Skin: Wrm nd dry. No rshes or lesions. Extremities: No clubbing or cynosis. No edem.  Psychitric: Mood nd ffect re norml. Insight nd judgment re pproprite.  Dischrge Instructions   Dischrge  Instructions    Cll MD for:  difficulty brething, hedche or visul disturbnces   Complete by:  As directed    Cll MD for:  extreme ftigue   Complete by:  As directed    Cll MD for:  persistnt dizziness or light-hededness   Complete by:  As directed    Cll MD for:  persistnt nuse nd vomiting   Complete by:  As directed    Cll MD for:  redness, tenderness, or signs of infection (pin, swelling, redness, odor or green/yellow dischrge round incision site)   Complete by:  As directed    Cll MD for:  severe uncontrolled pin   Complete by:  As directed    Cll MD for:  temperture >100.4   Complete by:  As directed    Diet - low sodium hert helthy   Complete by:  As directed    Dischrge instructions   Complete by:  As directed    You were seen for dehydrtion nd electrolyte bnormlities.  Your potssium nd mgnesium were still low on the dy of dischrge.  We've supplemented this nd hve strted you on orl supplementtion.  Plese follow up repet lbs within 1 week to determine if these re meds tht you'll need to continue.  Stop your spirin.  Continue your wrfrin for now.  You'll need to follow up with Dr. Mrin Olp to repet your pltelet counts nd determine if this cn be continued.  Stop your diuretics **Note De-Identified vi Obfusction** until you follow up with Dr. Mrin Olp for lbs (stop the lsix nd spironolctone).  Return for new, recurrent, or worsening symptoms.  Plese sk your PCP to request records from this hospitliztion so they know wht ws done nd wht the next steps will be.   Increse ctivity slowly   Complete by:  s directed      llergies s of 06/11/2018      Rections   Prochlorperzine Edisylte Other (See Comments)   Nervous/ flutter/ shkes--compzine   dhesive [tpe] Other (See Comments)   Redness/skin peeling Redness/hives from dhesive tpe( tolertes ltex gloves); tolertes pper tpe   Prochlorperzine Other (See Comments)   Jitters, hyper       Mediction List    STOP tking these medictions   spirin 81 MG EC tblet   furosemide 20 MG tblet Commonly known s:  LSIX   spironolctone 25 MG tblet Commonly known s:  LDCTONE     TKE these medictions   crvedilol 3.125 MG tblet Commonly known s:  COREG Tke 1 tblet (3.125 mg totl) by mouth 2 (two) times dily with  mel.   gbpentin 300 MG cpsule Commonly known s:  NEURONTIN TKE 1 CPSULE BY MOUTH THREE TIMES  DY Wht chnged:  See the new instructions.   HYDROmorphone 2 MG tblet Commonly known s:  DILUDID Tke 1 tblet (2 mg totl) by mouth every 4 (four) hours s needed for severe pin.   lidocine-prilocine crem Commonly known s:  EML pply 1 ppliction topiclly s needed. pply to port per instructions   LORzepm 1 MG tblet Commonly known s:  TIVN Tke 1 tblet (1 mg totl) by mouth every 8 (eight) hours s needed (for nuse nd vomiting). Wht chnged:  how much to tke   mgic mouthwsh w/lidocine Soln Tke 5 mLs by mouth 3 (three) times dily s needed for mouth pin.   mgnesium oxide 400 (241.3 Mg) MG tblet Commonly known s:  MG-OX Tke 1 tblet (400 mg totl) by mouth 2 (two) times dily for 14 dys. (plese follow up repet lbs within 1 week)   methocrbmol 500 MG tblet Commonly known s:  ROBXIN Tke 1 tblet (500 mg totl) by mouth every 6 (six) hours s needed for muscle spsms.   OLNZpine 10 MG tblet Commonly known s:  ZYPREX Tke 1 tblet (10 mg totl) by mouth t bedtime.   ondnsetron 8 MG tblet Commonly known s:  ZOFRN Tke 1 tblet (8 mg totl) by mouth every 12 (twelve) hours s needed for nuse or vomiting.   oxyCODONE 20 mg 12 hr tblet Commonly known s:  OXYCONTIN Tke 1 tblet (20 mg totl) by mouth every 12 (twelve) hours.   potssium chloride S 20 MEQ tblet Commonly known s:  K-DUR,KLOR-CON Tke 1 tblet (20 mEq totl) by mouth dily for 14 dys. (plese follow up  repet lbs within 1 week)   wrfrin 7.5 MG tblet Commonly known s:  COUMDIN Tke s directed. If you re unsure how to tke this mediction, tlk to your nurse or doctor. Originl instructions:  Tke 1 tblet (7.5 mg totl) by mouth dily t 6 PM.   zolpidem 5 MG tblet Commonly known s:  MBIEN TKE 1 TBLET BY MOUTH EVERYDY T BEDTIME      llergies  llergen Rections  . Prochlorperzine Edisylte Other (See Comments)    Nervous/ flutter/ shkes--compzine  . dhesive [Tpe] Other (See Comments)    Redness/skin peeling Redness/hives from dhesive tpe( tolertes **Note De-Identified via Obfuation** latex gloves); tolerates paper tape  . Prochlorperazine Other (See Comments)    Jitters, hyper      The results of significant diagnostics from this hospitalization (including imaging, microbiology, ancillary and laboratory) are listed below for reference.    Significant Diagnostic Studies: Dg Chest 2 View  Result Date: 06/09/2018 CLINICL DT:  Weakness and lightheadedness. History of ovarian cancer. EXM: CHEST - 2 VIEW COMPRISON:  Chest x-ray 10/06/2017 FINDINGS: The pacer wires/ICD wires are stable. The heart is normal in size. Mild tortuosity and calcification of the thoracic aorta. The pulmonary hila appear normal. Right-sided IJ Port--Cath tip is in good position. No complicating features. Evidence of prior tranatheter mitral valve replacement. The lungs are clear of an acute process. There are left basilar arring changes. Numerous calcifications around the left shoulder likely HDD or synovial osteochondromatosis. IMPRESSION: No acute cardiopulmonary findings. Electronically Signed   By: Marijo Sanes M.D.   On: 06/09/2018 21:01    Microbiology: Recent Results (from the past 240 hour(s))  Blood culture (routine x 2)     Status: None (Preliminary result)   Collection Time: 06/09/18  8:10 PM  Result Value Ref Range Status   Specimen Deription   Final    BLOOD PORT CTH Performed at [facility] [address], [place], [place] [address]    Special Requests   Final    BOTTLES DRWN EROBIC ND NEROBIC Blood Culture adequate volume Performed at [facility] [address], [place], [place] [address]    Culture   Final    NO GROWTH 2 DYS Performed at [facility] [address], [place], [place] [address]    Report Status PENDING  Incomplete  Blood culture (routine x 2)     Status: None (Preliminary result)   Collection Time: 06/09/18  8:14 PM  Result Value Ref Range Status   Specimen Deription   Final    BLOOD RIGHT HND Performed at [name] [address], [place], [place] [address]    Special Requests   Final    BOTTLES DRWN EROBIC ND NEROBIC Blood Culture adequate volume Performed at [facility] [address] [place], [place] [address]    Culture   Final    NO GROWTH 2 DYS Performed at [facility] [address], [place], [place] [address]    Report Status PENDING  Incomplete  Urine culture     Status: None   Collection Time: 06/09/18  8:56 PM  Result Value Ref Range Status   Specimen Deription   Final    URINE, RNDOM Performed at [facility] [address] [place], [place] [address]    Special Requests   Final    NONE Performed at [facility], [place] [address], [place], [place] [address]    Culture   Final    NO GROWTH Performed at [facility], [place] [address], [place], [place] [address]    Report Status 06/11/2018 FINL  Final     Labs: Basic Metabolic Panel: Recent Labs  Lab 06/09/18 1607 06/09/18 1937 06/10/18 0435 06/10/18 0951 06/11/18 0416  N 135 133* 137 136 136  K 3.0* 2.7* 3.5 3.3* 3.2*  CL 94* 93* 100 101 102  CO2 28 28 27 26 25   GLUCOSE 115* 107* 113* 103* 101*  BUN 30* 32* 29* 29* 23*  CRETININE 1.35* 1.32* 1.24* 1.17* 1.07*  CLCIUM 9.1 8.3* 8.6* 8.5* 8.2*  MG  1.0*  --  1.3*  --  1.2*   Liver Function Tests: Recent Labs  Lab 06/09/18 1607 06/09/18 1937 06/11/18 0416  AST 16 19  --   ALT 25 23  --   ALKPHOS 77 57  --   BILITOT 1.0 1.1  --   PROT 6.5 6.0*  --   ALBUMIN 3.4* 3.5 3.1*   Recent Labs  Lab 06/09/18 1937  LIPASE 31   No results for input(s): AMMONIA in the last 168 hours. CBC: Recent Labs  Lab 06/09/18 1607 06/09/18 1937 06/10/18 0435 06/11/18 0416  WBC 30.2* 26.4* 19.9* 8.4  NEUTROABS 29.3* 25.1*  --  6.4  HGB 12.3 11.2* 10.2* 9.2*  HCT 35.6* 33.1* 30.4* 28.3*  MCV 88.1 91.4 92.4 94.0  PLT 165 150 122* 75*   Cardiac Enzymes: Recent Labs  Lab 06/09/18 1937  TROPONINI <0.03   BNP: BNP (last 3 results) Recent Labs    09/12/17 0822  BNP 327.3*    ProBNP (last 3 results) No results for input(s): PROBNP in the last 8760 hours.  CBG: No results for input(s): GLUCAP in the last 168 hours.     Signed:  Fayrene Helper MD.  [facility] 06/11/2018, 12:11 PM

## 2018-06-11 NOTE — Progress Notes (Signed)
[facility] for Warfarin Indication: atrial fibrillation  Allergies  Allergen Reactions  . Prochlorperazine Edisylate Other (See Comments)    Nervous/ flutter/ shakes--compazine  . Adhesive [Tape] Other (See Comments)    Redness/skin peeling Redness/hives from adhesive tape( tolerates latex gloves); tolerates paper tape  . Prochlorperazine Other (See Comments)    Jitters, hyper    Patient Measurements: Height: 5\' 6"  (167.6 cm) Weight: 189 lb 14.4 oz (86.1 kg) IBW/kg (Calculated) : 59.3  Vital Signs: Temp: 98.2 F (36.8 C) (12/19 0500) Temp Source: Oral (12/19 0500) BP: 90/78 (12/19 0500) Pulse Rate: 76 (12/19 0500)  Labs: Recent Labs    06/09/18 1937 06/10/18 0435 06/10/18 0951 06/11/18 0416  HGB 11.2* 10.2*  --  9.2*  HCT 33.1* 30.4*  --  28.3*  PLT 150 122*  --  75*  LABPROT  --  33.7*  --  27.1*  INR  --  3.38  --  2.56  CREATININE 1.32* 1.24* 1.17* 1.07*  TROPONINI <0.03  --   --   --     Estimated Creatinine Clearance: 63.3 mL/min (A) (by C-G formula based on SCr of 1.07 mg/dL (H)).   Medical History: Past Medical History:  Diagnosis Date  . Arthritis    "everywhere"  . Asthma    as a child  . Automatic implantable cardioverter-defibrillator in situ    DR. Beckie Salts   . Bladder cancer [facility]) 2009   "injected medicine to get rid of it"  . Chronic systolic CHF (congestive heart failure) ([place])   . CKD (chronic kidney disease), stage III ([facility])   . Coronary artery disease    a. large RV/inferior MI complicated by pseudoaneurysm s/p aneurysemectomy and CABG 10/2009 with papillary muscle rupture requiring bioprosthetic mitral valve replacement in [facility].  . Granulosa cell carcinoma of ovary ([place] [facility])    recurrent - surgical resection 2007, 2014 and 2016   . H/O thymectomy   . High cholesterol   . Hypothyroidism   . ICD (implantable cardiac defibrillator) in place   . Ischemic cardiomyopathy    a.  ICM and VT arrest  10/2009 s/p AICD (revision 01/2012 due to RV lead problem).  . Myocardial infarction ([facility]) 10/14/2009  . Neuropathy    FEET AND HANDS - FROM CHEMO - BUT MUCH IMPROVED  . PAF (paroxysmal atrial fibrillation) ([place])   . Pain    JOINT PAINS AND MUSCLE ACHES ALL OVER.  Marland Kitchen Port-A-Cath in place    RIGHT UPPER CHEST  . Prosthetic valve dysfunction   . S/P mitral valve replacement with bioprosthetic valve 10/21/2009   Edwards Perimount stented bovine pericardial tissue valve, size 29 mm  . S/P valve-in-valve transcatheter mitral valve replacement 09/16/2017   29 mm Edwards Sapien 3 transcatheter heart valve placed via transapical approach  . SVT (supraventricular tachycardia) ([place])   . Tobacco abuse   . Ventricular tachycardia (H[facility])     Medications:  Coumadin PTA- 7.5mg  daily (recently increased by Dr Marin Olp due to interaction with new chemo regimen).  Last dose 12/16.  Assessment: Patient with chronic use of warfarin for afib.  INR on admit > 3.38.   06/11/2018:  INR within goal range at 2.56 after dose held 12/17 & 12/18  CBC- drop in Hg, pltc likely secondary to chemo.  Completed chemo cycle 12/13, pegfilgrastim given 12/16  No bleeding reported  Chronically on asa 81mg , no other DDI noted  Heart healthy diet ordered- no intake recorded  Goal of Therapy:  INR 2-3   Plan:  Coumadin 7.5mg  po x1 today Daily INR  Biagio Borg 06/11/2018,7:48 AM

## 2018-06-11 NOTE — Progress Notes (Signed)
Initial Nutrition Assessment  DOCUMENTATION CODES:   Obesity unspecified  INTERVENTION:  - Discussed good sources of K and Mg. - Continue to encourage PO intakes.   NUTRITION DIAGNOSIS:   Increased nutrient needs related to chronic illness, catabolic illness, cancer and cancer related treatments as evidenced by estimated needs.  GOAL:   Patient will meet greater than or equal to 90% of their needs  MONITOR:   PO intake, Weight trends, Labs  REASON FOR ASSESSMENT:   Malnutrition Screening Tool  ASSESSMENT:   57 y.o. female with medical history significant of granulosa cell tumor of ovary currently receiving chemotherapy and radiation, CHF, PAF on Coumadin, SVT s/p AICD, s/p bovine MVR. Last chemo was on 12/13 and had an infusion of pegfilgrastim 12/17.  She presented to the ED with complaint of lightheadedness. Other associated symptoms include poor p.o. intake, mild dysuria, and several episodes of non-bloody diarrhea over the last 48 hours. Patient noted that she held off on her diuretics over the last week due to her symptoms.  Per chart review, patient consumed 75% of dinner yesterday and 50% of breakfast this AM. Patient confirms this and states that she previously had a good appetite, although foods were bland or tasted off, but that appetite began to decrease after her last chemo treatment (12/13). Since that time she had been eating very minimally and in the 1-2 days PTA she did not eat anything d/t diarrhea and vomiting and very poor appetite.   Patient reports that her appetite is now returning. During RD visit she stated that her stomach was growling and she was looking forward to lunch.   She asked about foods that are high in K and Mg; this was discussed. Patient has preferred to focus on foods that typically have less flavor so that she "does not miss out on what it is supposed to taste like" although she reports taste sensation is improving with repletion of  electrolytes.   She reports 18 lb weight loss in the past 1 week. Per chart review, current weight is 190 lb and weight on 05/04/18 was 197 lb. This indicates 7 lb weight loss (3.5% body weight) in the past 1 month; not significant for time frame.    Medications reviewed; 400 mg Mag-ox BID, 6 g IV Mg sulfate x1 run 12/19, 20 mEq K-Dur x1 dose 12/18, 40 mEq K-Dur x1 dose 12/19. Labs reviewed; K: 3.2 mmol/L, BUN: 23 mg/dL, creatinine: 1.07 mg/dL, Ca: 8.2 mg/dL, Mg: 1.2 mg/dL, GFR: 57 mL/min.      NUTRITION - FOCUSED PHYSICAL EXAM:  Completed to upper body only; no muscle and no fat wasting.   Diet Order:   Diet Order            Diet Heart Room service appropriate? Yes; Fluid consistency: Thin  Diet effective now              EDUCATION NEEDS:   Education needs have been addressed  Skin:  Skin Assessment: Reviewed RN Assessment  Last BM:  12/19  Height:   Ht Readings from Last 1 Encounters:  06/10/18 5\' 6"  (1.676 m)    Weight:   Wt Readings from Last 1 Encounters:  06/10/18 86.1 kg    Ideal Body Weight:  59.09 kg  BMI:  Body mass index is 30.65 kg/m.  Estimated Nutritional Needs:   Kcal:  1900-2100 kcal  Protein:  90-105 grams  Fluid:  >/= 2 L/day     Jarome Matin, MS, RD, LDN, CNSC  Inpatient Clinical Dietitian Pager # [phone] After hours/weekend pager # [phone]

## 2018-06-12 ENCOUNTER — Ambulatory Visit
Admission: RE | Admit: 2018-06-12 | Discharge: 2018-06-12 | Disposition: A | Discharge status: Home / Self Care | Payer: BLUE CROSS/BLUE SHIELD | Source: Ambulatory Visit | Attending: Radiation Oncology | Admitting: Radiation Oncology

## 2018-06-12 ENCOUNTER — Ambulatory Visit: Payer: BLUE CROSS/BLUE SHIELD

## 2018-06-12 DIAGNOSIS — C569 Malignant neoplasm of unspecified ovary: Secondary | ICD-10-CM | POA: Diagnosis not present

## 2018-06-12 NOTE — Progress Notes (Signed)
Remote ICD transmission.   

## 2018-06-14 LAB — CULTURE, BLOOD (ROUTINE X 2)
Culture: NO GROWTH
Culture: NO GROWTH
Special Requests: ADEQUATE
Special Requests: ADEQUATE

## 2018-06-22 ENCOUNTER — Encounter: Payer: Self-pay | Admitting: Radiation Oncology

## 2018-06-22 NOTE — Progress Notes (Signed)
  Radiation Oncology         [phone] ________________________________  Name: [name] MRN: [id number]  Date: 06/22/2018  DOB: [birth year]  End of Treatment Note  DIAGNOSIS: Recurrent granulosa cell tumor of the right ovary  Indication for treatment:  Palliative, pelvis pain       Radiation treatment dates:   05/07/2018-06/12/2018  Site/dose:      pelvis, 1.8 Gy in 25 fractions for a total dose of 45.0 Gy  Beams/energy:   1. 3D, 15X, 10X, 6X         Narrative: The patient tolerated radiation treatment relatively well. At the beginning of her treatment, she noted chronic constipation which she was treating with miralax BID. Towards the end of her treatments, she noted significant fatigue,  She denied hematuria, vaginal bleeding or vaginal discharge. Pelvic pain overall improved with her treatment.  Plan: The patient has completed radiation treatment. The patient will return to radiation oncology clinic for routine followup in one month. I advised them to call or return sooner if they have any questions or concerns related to their recovery or treatment.  -----------------------------------  Blair Promise, PhD, MD  This document serves as a record of services personally performed by Gery Pray, MD. It was created on his behalf by [facility], a trained medical scribe. The creation of this record is based on the scribe's personal observations and the provider's statements to them. This document has been checked and approved by the attending provider.

## 2018-06-23 ENCOUNTER — Other Ambulatory Visit: Payer: Self-pay | Admitting: Hematology & Oncology

## 2018-06-25 ENCOUNTER — Other Ambulatory Visit: Payer: Self-pay | Admitting: *Deleted

## 2018-06-25 DIAGNOSIS — R110 Nausea: Secondary | ICD-10-CM

## 2018-06-25 DIAGNOSIS — T451X5A Adverse effect of antineoplastic and immunosuppressive drugs, initial encounter: Secondary | ICD-10-CM

## 2018-06-25 DIAGNOSIS — D3910 Neoplasm of uncertain behavior of unspecified ovary: Secondary | ICD-10-CM

## 2018-06-26 ENCOUNTER — Telehealth: Payer: Self-pay

## 2018-06-26 DIAGNOSIS — Z953 Presence of xenogenic heart valve: Secondary | ICD-10-CM

## 2018-06-26 NOTE — Telephone Encounter (Signed)
This encounter was created in error - please disregard.

## 2018-06-26 NOTE — Telephone Encounter (Signed)
TMVR completed 09/16/2017.  Scheduled patient for 1 year echo and office visit with Nell Range on August 27, 2018. She understands to arrive by 1230. She was grateful for assistance.

## 2018-06-26 NOTE — Telephone Encounter (Signed)
-----   Message from Theodoro Parma, RN sent at 10/31/2017  6:10 PM EDT ----- Regarding: 1 year TAVR/KT OV due 09/17/2018 TAVR completed 09/16/2017

## 2018-06-27 ENCOUNTER — Other Ambulatory Visit: Payer: Self-pay | Admitting: Hematology & Oncology

## 2018-07-02 ENCOUNTER — Encounter (HOSPITAL_COMMUNITY): Payer: Self-pay | Admitting: Hematology & Oncology

## 2018-07-03 ENCOUNTER — Other Ambulatory Visit: Payer: BLUE CROSS/BLUE SHIELD

## 2018-07-03 ENCOUNTER — Encounter: Payer: Self-pay | Admitting: Hematology & Oncology

## 2018-07-03 ENCOUNTER — Telehealth: Payer: Self-pay | Admitting: Hematology & Oncology

## 2018-07-03 ENCOUNTER — Inpatient Hospital Stay: Payer: BLUE CROSS/BLUE SHIELD | Attending: Hematology & Oncology

## 2018-07-03 ENCOUNTER — Inpatient Hospital Stay (HOSPITAL_BASED_OUTPATIENT_CLINIC_OR_DEPARTMENT_OTHER): Payer: BLUE CROSS/BLUE SHIELD | Admitting: Hematology & Oncology

## 2018-07-03 ENCOUNTER — Other Ambulatory Visit: Payer: Self-pay

## 2018-07-03 ENCOUNTER — Inpatient Hospital Stay: Payer: BLUE CROSS/BLUE SHIELD

## 2018-07-03 ENCOUNTER — Ambulatory Visit: Payer: BLUE CROSS/BLUE SHIELD | Admitting: Hematology & Oncology

## 2018-07-03 DIAGNOSIS — Z923 Personal history of irradiation: Secondary | ICD-10-CM | POA: Diagnosis not present

## 2018-07-03 DIAGNOSIS — Z95828 Presence of other vascular implants and grafts: Secondary | ICD-10-CM

## 2018-07-03 DIAGNOSIS — C569 Malignant neoplasm of unspecified ovary: Secondary | ICD-10-CM | POA: Diagnosis present

## 2018-07-03 DIAGNOSIS — D3910 Neoplasm of uncertain behavior of unspecified ovary: Secondary | ICD-10-CM

## 2018-07-03 DIAGNOSIS — Z7901 Long term (current) use of anticoagulants: Secondary | ICD-10-CM | POA: Diagnosis not present

## 2018-07-03 DIAGNOSIS — Z79899 Other long term (current) drug therapy: Secondary | ICD-10-CM | POA: Insufficient documentation

## 2018-07-03 LAB — CMP (CANCER CENTER ONLY)
ALT: 26 U/L (ref 0–44)
ANION GAP: 11 (ref 5–15)
AST: 17 U/L (ref 15–41)
Albumin: 3.5 g/dL (ref 3.5–5.0)
Alkaline Phosphatase: 66 U/L (ref 38–126)
BUN: 14 mg/dL (ref 6–20)
CO2: 27 mmol/L (ref 22–32)
Calcium: 8.9 mg/dL (ref 8.9–10.3)
Chloride: 102 mmol/L (ref 98–111)
Creatinine: 0.99 mg/dL (ref 0.44–1.00)
GFR, Est AFR Am: >60 mL/min (ref >60)
GFR, Estimated: >60 mL/min (ref >60)
Glucose, Bld: 89 mg/dL (ref 70–99)
Potassium: 3.5 mmol/L (ref 3.5–5.1)
Sodium: 140 mmol/L (ref 135–145)
Total Bilirubin: 0.2 mg/dL — ABNORMAL LOW (ref 0.3–1.2)
Total Protein: 6.1 g/dL — ABNORMAL LOW (ref 6.5–8.1)

## 2018-07-03 LAB — CBC WITH DIFFERENTIAL (CANCER CENTER ONLY)
Abs Immature Granulocytes: 0.19 10*3/uL — ABNORMAL HIGH (ref 0.00–0.07)
Basophils Absolute: 0.1 10*3/uL (ref 0.0–0.1)
Basophils Relative: 0 %
EOS ABS: 0 10*3/uL (ref 0.0–0.5)
Eosinophils Relative: 0 %
HCT: 27.9 % — ABNORMAL LOW (ref 36.0–46.0)
Hemoglobin: 8.9 g/dL — ABNORMAL LOW (ref 12.0–15.0)
IMMATURE GRANULOCYTES: 1 %
Lymphocytes Relative: 5 %
Lymphs Abs: 0.7 10*3/uL (ref 0.7–4.0)
MCH: 31 pg (ref 26.0–34.0)
MCHC: 31.9 g/dL (ref 30.0–36.0)
MCV: 97.2 fL (ref 80.0–100.0)
Monocytes Absolute: 1.6 10*3/uL — ABNORMAL HIGH (ref 0.1–1.0)
Monocytes Relative: 13 %
NEUTROS PCT: 81 %
Neutro Abs: 10.6 10*3/uL — ABNORMAL HIGH (ref 1.7–7.7)
Platelet Count: 369 10*3/uL (ref 150–400)
RBC: 2.87 MIL/uL — ABNORMAL LOW (ref 3.87–5.11)
RDW: 19.6 % — ABNORMAL HIGH (ref 11.5–15.5)
WBC Count: 13.2 10*3/uL — ABNORMAL HIGH (ref 4.0–10.5)
nRBC: 0.2 % (ref 0.0–0.2)

## 2018-07-03 MED ORDER — HEPARIN SOD (PORK) LOCK FLUSH 100 UNIT/ML IV SOLN
500.0000 [IU] | Freq: Once | INTRAVENOUS | Status: AC
  Administered 2018-07-03: 500 [IU] via INTRAVENOUS
  Filled 2018-07-03: qty 5

## 2018-07-03 MED ORDER — SODIUM CHLORIDE 0.9% FLUSH
10.0000 mL | INTRAVENOUS | Status: DC | PRN
  Administered 2018-07-03: 10 mL via INTRAVENOUS
  Filled 2018-07-03: qty 10

## 2018-07-03 MED ORDER — OXYCODONE HCL ER 20 MG PO T12A: 20.0000 mg | EXTENDED_RELEASE_TABLET | Freq: Two times a day (BID) | ORAL | 0 refills | Status: DC

## 2018-07-03 NOTE — Telephone Encounter (Signed)
Appointments scheduled calendar printed per 1/10 los

## 2018-07-03 NOTE — Progress Notes (Signed)
Hematology and Oncology Follow Up Visit  [name] [id number] April 16, 1961 58 y.o. 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  She is back for follow-up.  She really looks quite good.  She finished her radiation in December on December 20.  She actually did quite well.  She has responded from my perspective.  Her inhibin B tumor marker decreased from 1600 down to 40.  She did have a little bit of radiation dermatitis.  There is a little bit of skin breakdown at the gluteal cleft.  I looked at this today.  I do not see any infection or exudate.  She is having no further pain.  Before we started treatment, she cannot sit or lie down.  She was having that much discomfort.  She now is on OxyContin.  This really has helped her out.  She is back to work.  She really likes work and feels that work is important for her quality of life.  She has had no problems with cough or shortness of breath.  She has had no nausea or vomiting.  She is having no difficulties with her bowels or bladder.  There is no leg swelling.  She is on warfarin.  She has valvular issues.  She is followed by cardiology for this.  She has had no bleeding.  Overall, I would say her performance status is ECOG 1.  Medications:  Allergies as of 07/03/2018      Reactions   Prochlorperazine Edisylate Other (See Comments)   Nervous/ flutter/ shakes--compazine   Adhesive [tape] Other (See Comments)   Redness/skin peeling Redness/hives from adhesive tape( tolerates latex gloves); tolerates paper tape   Prochlorperazine Other (See Comments)   Jitters, hyper      Medication List       Accurate as of July 03, 2018  3:47 PM. Always use your most recent med list.        carvedilol 3.125 MG tablet Commonly known as:  COREG Take 1  tablet (3.125 mg total) by mouth 2 (two) times daily with a meal.   gabapentin 300 MG capsule Commonly known as:  NEURONTIN TAKE 1 CAPSULE BY MOUTH THREE TIMES A DAY   HYDROmorphone 2 MG tablet Commonly known as:  DILAUDID Take 1 tablet (2 mg total) by mouth every 4 (four) hours as needed for severe pain.   lidocaine-prilocaine cream Commonly known as:  EMLA Apply 1 application topically as needed. Apply to port per instructions   LORazepam 1 MG tablet Commonly known as:  ATIVAN Take 1 tablet (1 mg total) by mouth every 8 (eight) hours as needed (for nausea and vomiting).   LORazepam 0.5 MG tablet Commonly known as:  ATIVAN TAKE 1 TABLET BY MOUTH EVERY 6 HOURS AS NEEDED FOR NAUSEA AND VOMITING   magic mouthwash w/lidocaine Soln Take 5 mLs by mouth 3 (three) times daily as needed for mouth pain.   methocarbamol 500 MG tablet Commonly known as:  ROBAXIN Take 1 tablet (500 mg total) by mouth every 6 (six) hours as needed for muscle spasms.   OLANZapine 10 MG tablet Commonly known as:  ZYPREXA TAKE 1 TABLET BY MOUTH EVERYDAY AT BEDTIME   ondansetron 8 MG tablet Commonly known as:  ZOFRAN Take 1 tablet (8 mg total) by mouth every 12 (twelve) hours as needed  for nausea or vomiting.   oxyCODONE 20 mg 12 hr tablet Commonly known as:  OXYCONTIN Take 1 tablet (20 mg total) by mouth every 12 (twelve) hours.   potassium chloride SA 20 MEQ tablet Commonly known as:  K-DUR,KLOR-CON Take 1 tablet (20 mEq total) by mouth daily for 14 days. (please follow up repeat labs within 1 week)   warfarin 7.5 MG tablet Commonly known as:  COUMADIN Take as directed by the anticoagulation clinic. If you are unsure how to take this medication, talk to your nurse or doctor. Original instructions:  Take 1 tablet (7.5 mg total) by mouth daily at 6 PM.   zolpidem 5 MG tablet Commonly known as:  AMBIEN TAKE 1 TABLET BY MOUTH EVERYDAY AT BEDTIME       Allergies:  Allergies  Allergen Reactions    . Prochlorperazine Edisylate Other (See Comments)    Nervous/ flutter/ shakes--compazine  . Adhesive [Tape] Other (See Comments)    Redness/skin peeling Redness/hives from adhesive tape( tolerates latex gloves); tolerates paper tape  . Prochlorperazine Other (See Comments)    Jitters, hyper    Past Medical History, Surgical history, Social history, and Family History were reviewed and updated.  Review of Systems:  Review of Systems  Constitutional: Negative.   HENT: Negative.   Eyes: Negative.   Respiratory: Negative.   Cardiovascular: Negative.   Gastrointestinal: Negative.   Genitourinary: Negative.   Musculoskeletal: Negative.   Skin: Negative.   Neurological: Negative.   Endo/Heme/Allergies: Negative.   Psychiatric/Behavioral: Negative.       Exam:  weight is 183 lb (83 kg). Her oral temperature is 98.4 F (36.9 C). Her blood pressure is 100/56 (abnormal) and her pulse is 80. Her respiration is 18 and oxygen saturation is 100%.   Wt Readings from Last 3 Encounters:  07/03/18 183 lb (83 kg)  06/10/18 189 lb 14.4 oz (86.1 kg)  06/03/18 191 lb 6.4 oz (86.8 kg)    Physical Exam Vitals signs reviewed.  HENT:     Head: Normocephalic and atraumatic.  Eyes:     Pupils: Pupils are equal, round, and reactive to light.  Neck:     Musculoskeletal: Normal range of motion.  Cardiovascular:     Rate and Rhythm: Normal rate and regular rhythm.     Heart sounds: Normal heart sounds.  Pulmonary:     Effort: Pulmonary effort is normal.     Breath sounds: Normal breath sounds.  Abdominal:     General: Bowel sounds are normal.     Palpations: Abdomen is soft.  Musculoskeletal: Normal range of motion.        General: No tenderness or deformity.  Lymphadenopathy:     Cervical: No cervical adenopathy.  Skin:    General: Skin is warm and dry.     Findings: No erythema or rash.  Neurological:     Mental Status: She is alert and oriented to person, place, and time.   Psychiatric:        Behavior: Behavior normal.        Thought Content: Thought content normal.        Judgment: Judgment normal.      Lab Results  Component Value Date   WBC 13.2 (H) 07/03/2018   HGB 8.9 (L) 07/03/2018   HCT 27.9 (L) 07/03/2018   MCV 97.2 07/03/2018   PLT 369 07/03/2018   Lab Results  Component Value Date   FERRITIN 211 05/22/2018   IRON 101 05/22/2018   TIBC  245 05/22/2018   UIBC 145 05/22/2018   IRONPCTSAT 41 05/22/2018   Lab Results  Component Value Date   RBC 2.87 (L) 07/03/2018   No results found for: KPAFRELGTCHN, LAMBDASER, KAPLAMBRATIO No results found for: IGGSERUM, IGA, IGMSERUM No results found for: Odetta Pink, SPEI   Chemistry      Component Value Date/Time   NA 140 07/03/2018 1454   NA 140 07/21/2017 1120   NA 140 03/15/2016 0953   K 3.5 07/03/2018 1454   K 3.9 03/15/2016 0953   CL 102 07/03/2018 1454   CL 102 12/13/2015 0803   CO2 27 07/03/2018 1454   CO2 19 (L) 03/15/2016 0953   BUN 14 07/03/2018 1454   BUN 13 07/21/2017 1120   BUN 15.9 03/15/2016 0953   CREATININE 0.99 07/03/2018 1454   CREATININE 1.4 (H) 03/15/2016 0953      Component Value Date/Time   CALCIUM 8.9 07/03/2018 1454   CALCIUM 9.3 03/15/2016 0953   ALKPHOS 66 07/03/2018 1454   ALKPHOS 55 03/15/2016 0953   AST 17 07/03/2018 1454   AST 17 03/15/2016 0953   ALT 26 07/03/2018 1454   ALT 23 03/15/2016 0953   BILITOT 0.2 (L) 07/03/2018 1454   BILITOT 0.38 03/15/2016 0953       Impression and Plan: [name] is a very pleasant 58 yo caucasian female with recurrent granulosa cell tumor.  We will set her up with her CT scans in 3 weeks.  At that point, hopefully we will see that she has had a fantastic response.  Unfortunately, when we try to do the molecular markers, there was not enough tumor that we really could get a good analysis.  As such, I am not sure how will be able to do do any further therapy.   She has a negative PD-L1.  She is MMR proficient.  Maybe, we will just be able to follow her along and hopefully not find that she has tumor progression for a while.  Of note, she does have a PR positive tumor.  We might be able to consider some anti-progesterone agent.  I will see her back in 4 weeks.  I am just happy that her quality of life is where she would like it to be.   Volanda Napoleon, MD 1/10/20203:47 PM

## 2018-07-06 ENCOUNTER — Telehealth: Payer: Self-pay | Admitting: *Deleted

## 2018-07-06 ENCOUNTER — Other Ambulatory Visit: Payer: Self-pay | Admitting: *Deleted

## 2018-07-06 MED ORDER — OXYCODONE-ACETAMINOPHEN 5-325 MG PO TABS: 1.0000 | ORAL_TABLET | ORAL | 0 refills | Status: DC | PRN

## 2018-07-06 NOTE — Telephone Encounter (Signed)
Call received from patient requesting Percocet 5/325 mg as previously ordered.  Dr. Marin Olp notified and script sent per Dr. Marin Olp.  Call placed back to patient to notify her that script has been approved per Dr. Marin Olp.  Patient appreciative of call back and has no further questions at this time.

## 2018-07-07 LAB — INHIBIN B: Inhibin B: 31.5 pg/mL — ABNORMAL HIGH (ref 0.0–16.9)

## 2018-07-08 ENCOUNTER — Telehealth: Payer: Self-pay | Admitting: *Deleted

## 2018-07-08 NOTE — Telephone Encounter (Addendum)
Patient is aware of results  ----- Message from Volanda Napoleon, MD sent at 07/07/2018  8:13 PM EST ----- Call - the Inhibin B level is 31.  Great job!!  [name]

## 2018-07-12 LAB — CUP PACEART REMOTE DEVICE CHECK
Battery Remaining Longevity: 21 mo
Battery Remaining Percentage: 24 %
Battery Voltage: 2.8 V
Brady Statistic AP VP Percent: 1 %
Brady Statistic AP VS Percent: 1 %
Brady Statistic AS VP Percent: 1 %
Brady Statistic AS VS Percent: 99 %
Brady Statistic RA Percent Paced: 1 %
Brady Statistic RV Percent Paced: 1 %
Date Time Interrogation Session: 20191219230027
HighPow Impedance: 68 Ohm
HighPow Impedance: 68 Ohm
Implantable Lead Implant Date: 20110503
Implantable Lead Implant Date: 20130815
Implantable Lead Location: 753860
Implantable Pulse Generator Implant Date: 20110503
Lead Channel Impedance Value: 260 Ohm
Lead Channel Impedance Value: 360 Ohm
Lead Channel Pacing Threshold Amplitude: 1.5 V
Lead Channel Pacing Threshold Pulse Width: 1.3 ms
Lead Channel Sensing Intrinsic Amplitude: 11.4 mV
Lead Channel Sensing Intrinsic Amplitude: 2.9 mV
Lead Channel Setting Pacing Amplitude: 2 V
Lead Channel Setting Pacing Amplitude: 3 V
Lead Channel Setting Pacing Pulse Width: 1.3 ms
Lead Channel Setting Sensing Sensitivity: 0.5 mV
MDC IDC LEAD LOCATION: 753859
MDC IDC MSMT LEADCHNL RA PACING THRESHOLD AMPLITUDE: 0.75 V
MDC IDC MSMT LEADCHNL RA PACING THRESHOLD PULSEWIDTH: 0.5 ms
Pulse Gen Serial Number: 778164

## 2018-07-14 ENCOUNTER — Telehealth: Payer: Self-pay | Admitting: Hematology & Oncology

## 2018-07-14 NOTE — Telephone Encounter (Signed)
Called and spoke with patient regarding scheduling her imaging appointments @ [facility].  She stated she will call to schedule these appointments.

## 2018-07-17 ENCOUNTER — Telehealth: Payer: Self-pay

## 2018-07-17 NOTE — Telephone Encounter (Signed)
Called pt left msg overdue inr

## 2018-07-18 ENCOUNTER — Other Ambulatory Visit: Payer: Self-pay | Admitting: Hematology & Oncology

## 2018-07-23 ENCOUNTER — Encounter: Payer: Self-pay | Admitting: Radiation Oncology

## 2018-07-23 ENCOUNTER — Other Ambulatory Visit: Payer: Self-pay

## 2018-07-23 ENCOUNTER — Ambulatory Visit
Admission: RE | Admit: 2018-07-23 | Discharge: 2018-07-23 | Disposition: A | Discharge status: Home / Self Care | Payer: BLUE CROSS/BLUE SHIELD | Source: Ambulatory Visit | Attending: Radiation Oncology | Admitting: Radiation Oncology

## 2018-07-23 VITALS — BP 93/52 | HR 84 | Temp 98.1°F | Resp 18 | Ht 66.0 in | Wt 181.5 lb

## 2018-07-23 DIAGNOSIS — R5383 Other fatigue: Secondary | ICD-10-CM | POA: Insufficient documentation

## 2018-07-23 DIAGNOSIS — Z7901 Long term (current) use of anticoagulants: Secondary | ICD-10-CM | POA: Diagnosis not present

## 2018-07-23 DIAGNOSIS — Z79899 Other long term (current) drug therapy: Secondary | ICD-10-CM | POA: Insufficient documentation

## 2018-07-23 DIAGNOSIS — D3910 Neoplasm of uncertain behavior of unspecified ovary: Secondary | ICD-10-CM

## 2018-07-23 NOTE — Progress Notes (Signed)
Radiation Oncology         [phone]) [phone] ________________________________  Name: [name] MRN: [id number]  Date: 07/23/2018  DOB: [birth year]  Follow-Up Visit Note  CC: Default, Provider, MD  Marti Sleigh    ICD-10-CM   1. Granulosa cell tumor of ovary, unspecified laterality D39.10     Diagnosis:   58 y.o. female with Recurrent granulosa cell tumor of the right ovary  Interval Since Last Radiation:  5 weeks  Radiation treatment dates:   05/07/2018-06/12/2018 Site/dose:      Pelvis / 1.8 Gy x 25 fractions for a total dose of 45.0 Gy  Narrative:  The patient returns today for routine follow-up. She reports fatigue is improving and is now working part-time. She reports generalized pain today, rated 7/10. She reports pain in "tailbone" is rare and fleeting. She is taking 2-3 oxycodone-acetaminophen 5-325 mg tablets per day, managed by Dr. Marin Olp. She is not currently receiving any systemic therapy. She denies dysuria or hematuria. She denies vaginal bleeding or discharge. She denies rectal bleeding, diarrhea, or constipation.                           ALLERGIES:  is allergic to prochlorperazine edisylate; adhesive [tape]; and prochlorperazine.  Meds: Current Outpatient Medications  Medication Sig Dispense Refill  . carvedilol (COREG) 3.125 MG tablet Take 1 tablet (3.125 mg total) by mouth 2 (two) times daily with a meal.    . lidocaine-prilocaine (EMLA) cream Apply 1 application topically as needed. Apply to port per instructions 30 g 0  . LORazepam (ATIVAN) 0.5 MG tablet TAKE 1 TABLET BY MOUTH EVERY 6 HOURS AS NEEDED FOR NAUSEA AND VOMITING 60 tablet 0  . LORazepam (ATIVAN) 1 MG tablet Take 1 tablet (1 mg total) by mouth every 8 (eight) hours as needed (for nausea and vomiting). 30 tablet 1  . methocarbamol (ROBAXIN) 500 MG tablet Take 1 tablet (500 mg total) by mouth every 6 (six) hours as needed for muscle spasms. 60 tablet 2  . ondansetron (ZOFRAN) 8 MG tablet  Take 1 tablet (8 mg total) by mouth every 12 (twelve) hours as needed for nausea or vomiting. 30 tablet 3  . oxyCODONE (OXYCONTIN) 20 mg 12 hr tablet Take 1 tablet (20 mg total) by mouth every 12 (twelve) hours. 60 tablet 0  . oxyCODONE-acetaminophen (PERCOCET/ROXICET) 5-325 MG tablet Take 1-2 tablets by mouth every 4 (four) hours as needed for severe pain. 60 tablet 0  . warfarin (COUMADIN) 7.5 MG tablet Take 1 tablet (7.5 mg total) by mouth daily at 6 PM. 30 tablet 2  . zolpidem (AMBIEN) 5 MG tablet TAKE 1 TABLET BY MOUTH EVERYDAY AT BEDTIME 30 tablet 2  . gabapentin (NEURONTIN) 300 MG capsule TAKE 1 CAPSULE BY MOUTH THREE TIMES A DAY (Patient not taking: No sig reported) 270 capsule 1  . magic mouthwash w/lidocaine SOLN Take 5 mLs by mouth 3 (three) times daily as needed for mouth pain. (Patient not taking: Reported on 07/23/2018) 450 mL 2  . OLANZapine (ZYPREXA) 10 MG tablet TAKE 1 TABLET BY MOUTH EVERYDAY AT BEDTIME (Patient not taking: Reported on 07/03/2018) 90 tablet 2  . potassium chloride SA (K-DUR,KLOR-CON) 20 MEQ tablet Take 1 tablet (20 mEq total) by mouth daily for 14 days. (please follow up repeat labs within 1 week) 14 tablet 0   No current facility-administered medications for this encounter.     Physical Findings: The patient is in no acute  distress. Patient is alert and oriented.  height is 5\' 6"  (1.676 m) and weight is 181 lb 8 oz (82.3 kg). Her oral temperature is 98.1 F (36.7 C). Her blood pressure is 93/52 (abnormal) and her pulse is 84. Her respiration is 18 and oxygen saturation is 100%.   Lungs are clear to auscultation bilaterally. Heart has regular rate and rhythm. No palpable cervical, supraclavicular, or axillary adenopathy. Abdomen soft, non-tender, normal bowel sounds.  Lab Findings: Lab Results  Component Value Date   WBC 13.2 (H) 07/03/2018   HGB 8.9 (L) 07/03/2018   HCT 27.9 (L) 07/03/2018   MCV 97.2 07/03/2018   PLT 369 07/03/2018    Radiographic  Findings: No results found.  Impression:  Recurrent granulosa cell tumor of the right ovary. The patient is recovering from the effects of radiation.  Pain much better after her palliative radiation therapy directed at the pelvic masses.  Plan:  PRN follow-up in radiation oncology. She will continue close follow-up in medical oncology. She is scheduled for CT C/A/P scans tomorrow and has follow-up with Dr. Marin Olp on February 6th.  ____________________________________  Blair Promise, PhD, MD  This document serves as a record of services personally performed by Gery Pray, MD. It was created on his behalf by Rae Lips, a trained medical scribe. The creation of this record is based on the scribe's personal observations and the provider's statements to them. This document has been checked and approved by the attending provider.

## 2018-07-23 NOTE — Progress Notes (Signed)
Pt presents today for f/u with Dr. Sondra Come. Pt reports fatigue is lessening and is now working part time. Pt reports pain in "tailbone" is rare and fleeting. Pt c/o generalized pain today, rated 7/10. Pt denies dysuria/hematuria. Pt denies vaginal bleeding/discharge. Pt denies rectal bleeding, diarrhea/constipation.   BP (!) 93/52 (BP Location: Left Arm, Patient Position: Sitting)   Pulse 84   Temp 98.1 F (36.7 C) (Oral)   Resp 18   Ht 5\' 6"  (1.676 m)   Wt 181 lb 8 oz (82.3 kg)   SpO2 100%   BMI 29.29 kg/m   Wt Readings from Last 3 Encounters:  07/23/18 181 lb 8 oz (82.3 kg)  07/03/18 183 lb (83 kg)  06/10/18 189 lb 14.4 oz (86.1 kg)   Loma Sousa, RN BSN

## 2018-07-24 ENCOUNTER — Ambulatory Visit (HOSPITAL_BASED_OUTPATIENT_CLINIC_OR_DEPARTMENT_OTHER)
Admission: RE | Admit: 2018-07-24 | Discharge: 2018-07-24 | Disposition: A | Discharge status: Home / Self Care | Payer: BLUE CROSS/BLUE SHIELD | Source: Ambulatory Visit | Attending: Hematology & Oncology | Admitting: Hematology & Oncology

## 2018-07-24 ENCOUNTER — Encounter: Payer: Self-pay | Admitting: *Deleted

## 2018-07-24 ENCOUNTER — Inpatient Hospital Stay: Payer: BLUE CROSS/BLUE SHIELD

## 2018-07-24 ENCOUNTER — Encounter (HOSPITAL_BASED_OUTPATIENT_CLINIC_OR_DEPARTMENT_OTHER): Payer: Self-pay

## 2018-07-24 DIAGNOSIS — D3910 Neoplasm of uncertain behavior of unspecified ovary: Secondary | ICD-10-CM | POA: Diagnosis not present

## 2018-07-24 DIAGNOSIS — N183 Chronic kidney disease, stage 3 unspecified: Secondary | ICD-10-CM

## 2018-07-24 DIAGNOSIS — C569 Malignant neoplasm of unspecified ovary: Secondary | ICD-10-CM | POA: Diagnosis not present

## 2018-07-24 LAB — RETICULOCYTES
Immature Retic Fract: 12.7 % (ref 2.3–15.9)
RBC.: 3.51 MIL/uL — ABNORMAL LOW (ref 3.87–5.11)
Retic Count, Absolute: 109.2 10*3/uL (ref 19.0–186.0)
Retic Ct Pct: 3.1 % (ref 0.4–3.1)

## 2018-07-24 LAB — CMP (CANCER CENTER ONLY)
ALT: 9 U/L (ref 0–44)
AST: 15 U/L (ref 15–41)
Albumin: 4.1 g/dL (ref 3.5–5.0)
Alkaline Phosphatase: 51 U/L (ref 38–126)
Anion gap: 9 (ref 5–15)
BILIRUBIN TOTAL: 0.4 mg/dL (ref 0.3–1.2)
BUN: 11 mg/dL (ref 6–20)
CO2: 30 mmol/L (ref 22–32)
CREATININE: 1.03 mg/dL — AB (ref 0.44–1.00)
Calcium: 9.1 mg/dL (ref 8.9–10.3)
Chloride: 101 mmol/L (ref 98–111)
GFR, Est AFR Am: >60 mL/min (ref >60)
GFR, Estimated: 60 mL/min — ABNORMAL LOW (ref >60)
Glucose, Bld: 95 mg/dL (ref 70–99)
Potassium: 3.1 mmol/L — ABNORMAL LOW (ref 3.5–5.1)
Sodium: 140 mmol/L (ref 135–145)
Total Protein: 6.3 g/dL — ABNORMAL LOW (ref 6.5–8.1)

## 2018-07-24 LAB — CBC WITH DIFFERENTIAL (CANCER CENTER ONLY)
Abs Immature Granulocytes: 0.02 10*3/uL (ref 0.00–0.07)
Basophils Absolute: 0 10*3/uL (ref 0.0–0.1)
Basophils Relative: 1 %
Eosinophils Absolute: 0.2 10*3/uL (ref 0.0–0.5)
Eosinophils Relative: 3 %
HEMATOCRIT: 34.2 % — AB (ref 36.0–46.0)
Hemoglobin: 11.3 g/dL — ABNORMAL LOW (ref 12.0–15.0)
Immature Granulocytes: 0 %
LYMPHS ABS: 0.4 10*3/uL — AB (ref 0.7–4.0)
Lymphocytes Relative: 7 %
MCH: 32.2 pg (ref 26.0–34.0)
MCHC: 33 g/dL (ref 30.0–36.0)
MCV: 97.4 fL (ref 80.0–100.0)
Monocytes Absolute: 0.7 10*3/uL (ref 0.1–1.0)
Monocytes Relative: 12 %
Neutro Abs: 5 10*3/uL (ref 1.7–7.7)
Neutrophils Relative %: 77 %
Platelet Count: 147 10*3/uL — ABNORMAL LOW (ref 150–400)
RBC: 3.51 MIL/uL — ABNORMAL LOW (ref 3.87–5.11)
RDW: 17.7 % — ABNORMAL HIGH (ref 11.5–15.5)
WBC Count: 6.4 10*3/uL (ref 4.0–10.5)
nRBC: 0 % (ref 0.0–0.2)

## 2018-07-24 LAB — IRON AND TIBC
Iron: 69 ug/dL (ref 41–142)
Saturation Ratios: 24 % (ref 21–57)
TIBC: 286 ug/dL (ref 236–444)
UIBC: 217 ug/dL (ref 120–384)

## 2018-07-24 LAB — SAMPLE TO BLOOD BANK

## 2018-07-24 LAB — FERRITIN: Ferritin: 181 ng/mL (ref 11–307)

## 2018-07-24 MED ORDER — IOPAMIDOL (ISOVUE-300) INJECTION 61%
100.0000 mL | Freq: Once | INTRAVENOUS | Status: AC | PRN
  Administered 2018-07-24: 100 mL via INTRAVENOUS

## 2018-07-24 MED ORDER — HEPARIN SOD (PORK) LOCK FLUSH 100 UNIT/ML IV SOLN
500.0000 [IU] | Freq: Once | INTRAVENOUS | Status: AC
  Administered 2018-07-24: 500 [IU] via INTRAVENOUS
  Filled 2018-07-24: qty 5

## 2018-07-24 MED ORDER — SODIUM CHLORIDE 0.9% FLUSH
10.0000 mL | INTRAVENOUS | Status: DC | PRN
  Administered 2018-07-24: 10 mL via INTRAVENOUS
  Filled 2018-07-24: qty 10

## 2018-07-24 NOTE — Progress Notes (Signed)
Port accessed for CT scan

## 2018-07-28 LAB — INHIBIN B: Inhibin B: 17 pg/mL — ABNORMAL HIGH (ref 0.0–16.9)

## 2018-07-30 ENCOUNTER — Inpatient Hospital Stay: Payer: BLUE CROSS/BLUE SHIELD | Attending: Hematology & Oncology

## 2018-07-30 ENCOUNTER — Inpatient Hospital Stay (HOSPITAL_BASED_OUTPATIENT_CLINIC_OR_DEPARTMENT_OTHER): Payer: BLUE CROSS/BLUE SHIELD | Admitting: Hematology & Oncology

## 2018-07-30 ENCOUNTER — Encounter: Payer: Self-pay | Admitting: Hematology & Oncology

## 2018-07-30 ENCOUNTER — Inpatient Hospital Stay: Payer: BLUE CROSS/BLUE SHIELD

## 2018-07-30 ENCOUNTER — Other Ambulatory Visit: Payer: Self-pay

## 2018-07-30 VITALS — BP 113/68 | HR 83 | Temp 98.7°F | Resp 19 | Wt 185.0 lb

## 2018-07-30 DIAGNOSIS — R52 Pain, unspecified: Secondary | ICD-10-CM

## 2018-07-30 DIAGNOSIS — D3910 Neoplasm of uncertain behavior of unspecified ovary: Secondary | ICD-10-CM

## 2018-07-30 DIAGNOSIS — C569 Malignant neoplasm of unspecified ovary: Secondary | ICD-10-CM | POA: Insufficient documentation

## 2018-07-30 DIAGNOSIS — Z95828 Presence of other vascular implants and grafts: Secondary | ICD-10-CM

## 2018-07-30 LAB — CBC WITH DIFFERENTIAL (CANCER CENTER ONLY)
Abs Immature Granulocytes: 0.03 10*3/uL (ref 0.00–0.07)
Basophils Absolute: 0 10*3/uL (ref 0.0–0.1)
Basophils Relative: 0 %
Eosinophils Absolute: 0.2 10*3/uL (ref 0.0–0.5)
Eosinophils Relative: 3 %
HCT: 32.4 % — ABNORMAL LOW (ref 36.0–46.0)
Hemoglobin: 10.8 g/dL — ABNORMAL LOW (ref 12.0–15.0)
Immature Granulocytes: 0 %
LYMPHS PCT: 10 %
Lymphs Abs: 0.7 10*3/uL (ref 0.7–4.0)
MCH: 32.4 pg (ref 26.0–34.0)
MCHC: 33.3 g/dL (ref 30.0–36.0)
MCV: 97.3 fL (ref 80.0–100.0)
Monocytes Absolute: 0.7 10*3/uL (ref 0.1–1.0)
Monocytes Relative: 10 %
Neutro Abs: 5.2 10*3/uL (ref 1.7–7.7)
Neutrophils Relative %: 77 %
Platelet Count: 155 10*3/uL (ref 150–400)
RBC: 3.33 MIL/uL — ABNORMAL LOW (ref 3.87–5.11)
RDW: 17.1 % — ABNORMAL HIGH (ref 11.5–15.5)
WBC Count: 6.7 10*3/uL (ref 4.0–10.5)
nRBC: 0 % (ref 0.0–0.2)

## 2018-07-30 LAB — CMP (CANCER CENTER ONLY)
ALT: 10 U/L (ref 0–44)
ANION GAP: 9 (ref 5–15)
AST: 15 U/L (ref 15–41)
Albumin: 3.9 g/dL (ref 3.5–5.0)
Alkaline Phosphatase: 47 U/L (ref 38–126)
BUN: 12 mg/dL (ref 6–20)
CALCIUM: 9 mg/dL (ref 8.9–10.3)
CO2: 29 mmol/L (ref 22–32)
Chloride: 100 mmol/L (ref 98–111)
Creatinine: 1.18 mg/dL — ABNORMAL HIGH (ref 0.44–1.00)
GFR, Est AFR Am: 59 mL/min — ABNORMAL LOW (ref >60)
GFR, Estimated: 51 mL/min — ABNORMAL LOW (ref >60)
Glucose, Bld: 113 mg/dL — ABNORMAL HIGH (ref 70–99)
Potassium: 3 mmol/L — CL (ref 3.5–5.1)
Sodium: 138 mmol/L (ref 135–145)
Total Bilirubin: 0.3 mg/dL (ref 0.3–1.2)
Total Protein: 5.8 g/dL — ABNORMAL LOW (ref 6.5–8.1)

## 2018-07-30 LAB — PROTIME-INR
INR: 3
Prothrombin Time: 30.7 seconds — ABNORMAL HIGH (ref 11.4–15.2)

## 2018-07-30 MED ORDER — SODIUM CHLORIDE 0.9% FLUSH
10.0000 mL | INTRAVENOUS | Status: DC | PRN
  Administered 2018-07-30: 10 mL via INTRAVENOUS
  Filled 2018-07-30: qty 10

## 2018-07-30 MED ORDER — HEPARIN SOD (PORK) LOCK FLUSH 100 UNIT/ML IV SOLN
500.0000 [IU] | Freq: Once | INTRAVENOUS | Status: AC
  Administered 2018-07-30: 500 [IU] via INTRAVENOUS
  Filled 2018-07-30: qty 5

## 2018-07-30 MED ORDER — OXYCODONE-ACETAMINOPHEN 5-325 MG PO TABS: 1.0000 | ORAL_TABLET | ORAL | 0 refills | Status: DC | PRN

## 2018-07-30 NOTE — Addendum Note (Signed)
Addended by: Burney Gauze R on: 07/30/2018 04:25 PM   Modules accepted: Orders

## 2018-07-30 NOTE — Progress Notes (Signed)
Lab informed me of a critical potassium level of 3.0. Results given to MD.

## 2018-07-30 NOTE — Progress Notes (Signed)
Hematology and Oncology Follow Up Visit  [name] [id number] Oct 09, 1960 58 y.o. 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  She keeps looking better and better.  I am just very happy for her.  When we first had to treat her for the recurrent granulosa cell tumor, she was miserable.  She had to be admitted.  She required IV pain medication.  We started her on radiation therapy.  We used cis-platinum and a VP-16 as a radiation sensitizer.  She completed all of this back in June 12, 2018.  We did a CT scan for follow-up.  CT scan was done on 07/24/2018.  The CT scan showed improved metastasis.  Her areas of metastatic disease were probably about 50% smaller.  Her inhibin B level was down to 17.  She still having some pain issues.  She is still on OxyContin with some Percocet.  This is helping her.  She is working half days now.  This is helpful for her and this really helps improve her quality of life as she enjoys work.  Her appetite is good.  She is had no bleeding.  There is been no change in bowel or bladder habits.  She has had no leg swelling.  She has had no rashes.  Overall, her performance status is ECOG 1.    Medications:  Allergies as of 07/30/2018      Reactions   Prochlorperazine Edisylate Other (See Comments)   Nervous/ flutter/ shakes--compazine   Adhesive [tape] Other (See Comments)   Redness/skin peeling Redness/hives from adhesive tape( tolerates latex gloves); tolerates paper tape   Prochlorperazine Other (See Comments)   Jitters, hyper      Medication List       Accurate as of July 30, 2018  3:04 PM. Always use your most recent med list.        carvedilol 3.125 MG tablet Commonly known as:  COREG Take 1 tablet (3.125 mg total) by mouth 2 (two) times daily with a  meal.   lidocaine-prilocaine cream Commonly known as:  EMLA Apply 1 application topically as needed. Apply to port per instructions   LORazepam 1 MG tablet Commonly known as:  ATIVAN Take 1 tablet (1 mg total) by mouth every 8 (eight) hours as needed (for nausea and vomiting).   LORazepam 0.5 MG tablet Commonly known as:  ATIVAN TAKE 1 TABLET BY MOUTH EVERY 6 HOURS AS NEEDED FOR NAUSEA AND VOMITING   magic mouthwash w/lidocaine Soln Take 5 mLs by mouth 3 (three) times daily as needed for mouth pain.   methocarbamol 500 MG tablet Commonly known as:  ROBAXIN Take 1 tablet (500 mg total) by mouth every 6 (six) hours as needed for muscle spasms.   oxyCODONE 20 mg 12 hr tablet Commonly known as:  OXYCONTIN Take 1 tablet (20 mg total) by mouth every 12 (twelve) hours.   oxyCODONE-acetaminophen 5-325 MG tablet Commonly known as:  PERCOCET/ROXICET Take 1-2 tablets by mouth every 4 (four) hours as needed for severe pain.   potassium chloride SA 20 MEQ tablet Commonly known as:  K-DUR,KLOR-CON Take 1 tablet (20 mEq total) by mouth daily for 14 days. (please follow up repeat labs within 1 week)   warfarin 7.5 MG tablet Commonly known as:  COUMADIN Take as directed by the anticoagulation clinic. If  you are unsure how to take this medication, talk to your nurse or doctor. Original instructions:  Take 1 tablet (7.5 mg total) by mouth daily at 6 PM.   zolpidem 5 MG tablet Commonly known as:  AMBIEN TAKE 1 TABLET BY MOUTH EVERYDAY AT BEDTIME       Allergies:  Allergies  Allergen Reactions  . Prochlorperazine Edisylate Other (See Comments)    Nervous/ flutter/ shakes--compazine  . Adhesive [Tape] Other (See Comments)    Redness/skin peeling Redness/hives from adhesive tape( tolerates latex gloves); tolerates paper tape  . Prochlorperazine Other (See Comments)    Jitters, hyper    Past Medical History, Surgical history, Social history, and Family History were reviewed and  updated.  Review of Systems:  Review of Systems  Constitutional: Negative.   HENT: Negative.   Eyes: Negative.   Respiratory: Negative.   Cardiovascular: Negative.   Gastrointestinal: Negative.   Genitourinary: Negative.   Musculoskeletal: Negative.   Skin: Negative.   Neurological: Negative.   Endo/Heme/Allergies: Negative.   Psychiatric/Behavioral: Negative.       Exam:  weight is 185 lb (83.9 kg). Her oral temperature is 98.7 F (37.1 C). Her blood pressure is 113/68 and her pulse is 83. Her respiration is 19 and oxygen saturation is 97%.   Wt Readings from Last 3 Encounters:  07/30/18 185 lb (83.9 kg)  07/23/18 181 lb 8 oz (82.3 kg)  07/03/18 183 lb (83 kg)    Physical Exam Vitals signs reviewed.  HENT:     Head: Normocephalic and atraumatic.  Eyes:     Pupils: Pupils are equal, round, and reactive to light.  Neck:     Musculoskeletal: Normal range of motion.  Cardiovascular:     Rate and Rhythm: Normal rate and regular rhythm.     Heart sounds: Normal heart sounds.  Pulmonary:     Effort: Pulmonary effort is normal.     Breath sounds: Normal breath sounds.  Abdominal:     General: Bowel sounds are normal.     Palpations: Abdomen is soft.  Musculoskeletal: Normal range of motion.        General: No tenderness or deformity.  Lymphadenopathy:     Cervical: No cervical adenopathy.  Skin:    General: Skin is warm and dry.     Findings: No erythema or rash.  Neurological:     Mental Status: She is alert and oriented to person, place, and time.  Psychiatric:        Behavior: Behavior normal.        Thought Content: Thought content normal.        Judgment: Judgment normal.      Lab Results  Component Value Date   WBC 6.7 07/30/2018   HGB 10.8 (L) 07/30/2018   HCT 32.4 (L) 07/30/2018   MCV 97.3 07/30/2018   PLT 155 07/30/2018   Lab Results  Component Value Date   FERRITIN 181 07/24/2018   IRON 69 07/24/2018   TIBC 286 07/24/2018   UIBC 217  07/24/2018   IRONPCTSAT 24 07/24/2018   Lab Results  Component Value Date   RETICCTPCT 3.1 07/24/2018   RBC 3.33 (L) 07/30/2018   No results found for: KPAFRELGTCHN, LAMBDASER, KAPLAMBRATIO No results found for: IGGSERUM, IGA, IGMSERUM No results found for: Ronnald Ramp, A1GS, A2GS, Violet Baldy, MSPIKE, SPEI   Chemistry      Component Value Date/Time   NA 140 07/24/2018 0925   NA 140 07/21/2017 1120   NA  140 03/15/2016 0953   K 3.1 (L) 07/24/2018 0925   K 3.9 03/15/2016 0953   CL 101 07/24/2018 0925   CL 102 12/13/2015 0803   CO2 30 07/24/2018 0925   CO2 19 (L) 03/15/2016 0953   BUN 11 07/24/2018 0925   BUN 13 07/21/2017 1120   BUN 15.9 03/15/2016 0953   CREATININE 1.03 (H) 07/24/2018 0925   CREATININE 1.4 (H) 03/15/2016 0953      Component Value Date/Time   CALCIUM 9.1 07/24/2018 0925   CALCIUM 9.3 03/15/2016 0953   ALKPHOS 51 07/24/2018 0925   ALKPHOS 55 03/15/2016 0953   AST 15 07/24/2018 0925   AST 17 03/15/2016 0953   ALT 9 07/24/2018 0925   ALT 23 03/15/2016 0953   BILITOT 0.4 07/24/2018 0925   BILITOT 0.38 03/15/2016 0953       Impression and Plan: [name] is a very pleasant 58 yo caucasian female with recurrent granulosa cell tumor.  Hopefully, we will see continued improvement in her tumor.  We will set her up with another CT scan at the end of March.  We will flush her Port-A-Cath in 3-4 weeks.  I will plan to see her back at the end of March.  We will do her CT scan the same day that I see her.  I am just excited and very pleased that her quality of life is back to where she would like it to be.  She is still improving.  Hopefully, the inhibin B will show [place] that she is responding and will have a prolonged response.  Volanda Napoleon, MD 2/6/20203:04 PM

## 2018-07-31 LAB — LACTATE DEHYDROGENASE: LDH: 151 U/L (ref 98–192)

## 2018-08-02 ENCOUNTER — Other Ambulatory Visit: Payer: Self-pay | Admitting: Internal Medicine

## 2018-08-02 ENCOUNTER — Other Ambulatory Visit: Payer: Self-pay | Admitting: Interventional Cardiology

## 2018-08-02 DIAGNOSIS — I4729 Other ventricular tachycardia: Secondary | ICD-10-CM

## 2018-08-02 DIAGNOSIS — I472 Ventricular tachycardia: Secondary | ICD-10-CM

## 2018-08-02 DIAGNOSIS — I5022 Chronic systolic (congestive) heart failure: Secondary | ICD-10-CM

## 2018-08-02 DIAGNOSIS — Z95810 Presence of automatic (implantable) cardiac defibrillator: Secondary | ICD-10-CM

## 2018-08-03 NOTE — Telephone Encounter (Signed)
Spoke with pt and she states that Spironolactone was d/c'ed after recent hospitalization because her K+ and Mg were low.  She states Dr. Marin Olp told her to restart this medication.  Advised pt to reach out to Dr.Ennever for a refill then since the drug is no longer on her list and I am not sure what dose and frequency he suggested the pt take.  Pt agreeable to plan.

## 2018-08-04 LAB — INHIBIN B: Inhibin B: 14.8 pg/mL (ref 0.0–16.9)

## 2018-08-06 ENCOUNTER — Other Ambulatory Visit (HOSPITAL_COMMUNITY): Payer: Self-pay | Admitting: Hematology & Oncology

## 2018-08-06 DIAGNOSIS — D3910 Neoplasm of uncertain behavior of unspecified ovary: Secondary | ICD-10-CM

## 2018-08-07 ENCOUNTER — Telehealth: Payer: Self-pay

## 2018-08-07 NOTE — Telephone Encounter (Signed)
Called pt to let them know that they areoverdue for their coumadin check pt informed me that they have been being followed by pcp for anticoagulation studies I will end the episode with [place] as far as the [facility] coumadin goes

## 2018-08-13 ENCOUNTER — Other Ambulatory Visit: Payer: Self-pay | Admitting: Hematology & Oncology

## 2018-08-14 ENCOUNTER — Other Ambulatory Visit: Payer: Self-pay | Admitting: *Deleted

## 2018-08-14 MED ORDER — METHOCARBAMOL 500 MG PO TABS: 500.0000 mg | ORAL_TABLET | Freq: Four times a day (QID) | ORAL | 2 refills | Status: DC | PRN

## 2018-08-14 MED ORDER — LORAZEPAM 0.5 MG PO TABS: ORAL_TABLET | ORAL | 0 refills | Status: DC

## 2018-08-18 ENCOUNTER — Other Ambulatory Visit: Payer: Self-pay | Admitting: *Deleted

## 2018-08-19 MED ORDER — OXYCODONE-ACETAMINOPHEN 5-325 MG PO TABS: 1.0000 | ORAL_TABLET | ORAL | 0 refills | Status: DC | PRN

## 2018-08-24 ENCOUNTER — Other Ambulatory Visit: Payer: Self-pay | Admitting: Hematology & Oncology

## 2018-08-25 ENCOUNTER — Other Ambulatory Visit: Payer: Self-pay | Admitting: *Deleted

## 2018-08-27 ENCOUNTER — Ambulatory Visit (HOSPITAL_COMMUNITY): Payer: BLUE CROSS/BLUE SHIELD | Attending: Cardiovascular Disease

## 2018-08-27 ENCOUNTER — Encounter: Payer: Self-pay | Admitting: Physician Assistant

## 2018-08-27 ENCOUNTER — Ambulatory Visit (INDEPENDENT_AMBULATORY_CARE_PROVIDER_SITE_OTHER): Payer: BLUE CROSS/BLUE SHIELD | Admitting: Physician Assistant

## 2018-08-27 VITALS — BP 130/60 | HR 85 | Ht 66.0 in | Wt 180.1 lb

## 2018-08-27 DIAGNOSIS — Z953 Presence of xenogenic heart valve: Secondary | ICD-10-CM | POA: Diagnosis not present

## 2018-08-27 NOTE — Progress Notes (Signed)
[facility] [place]                                       Cardiology Office Note    Date:  08/27/2018   ID:  [name], DOB June 20, 1961, MRN [id number]  PCP:  Default, Provider, MD  Cardiologist:  Dr. Tamala Julian / Dr. Burt Knack & Dr. Roxy Manns (TAVR)  CC: 1 year s/p TMVR  History of Present Illness:  [name] is a 58 y.o. female with a history of HLD, hypothyroidism, stage III CKD, ongoing tobacco abuse, recurrent ovarian cancer with multiple intra-abdominal metastases treated with prior surgical resection in 2014 and 2015 with further slow progression, ICM with VT s/p ICD, CAD s/p inferior MI in 4098 complicated by postinfarction papillary muscle rupture and left ventricular aneurysm who underwent urgent repair with mitral valve replacement using a bioprosthetic tissue valve and repair of the left ventricular aneurysm and recent prosthetic mitral valve stenosis s/p TMVR (08/2017) who presents to clinic for follow up.   The patient has a history of inferior MI in 1191 complicated by postinfarction papillary muscle rupture and LV aneurysm.  She underwent urgent mitral valve replacement with a bioprosthetic tissue valve and repair of LV aneurysm.  She was originally treated with a 29 mm Edwards Paramount bovine stented pericardial valve.  She developed symptoms of progressive combined systolic and diastolic heart failure and was found to have severe bioprosthetic mitral stenosis with a mean gradient of 21 mmHg and reduced LV function with an ejection fraction of 35 to 40%.  Because of recurrent ovarian cancer with slow progression, the multidisciplinary heart team recommended transcatheter mitral valve in valve replacement for treatment of her severe mitral stenosis.  The patient was treated with a 29 mm Sapien 3 valve via a trans-apical approach on 09/16/2017.   The patient's postoperative echo demonstrated elevated mean gradients across the valve at 15  mmHg with valve area by pressure half-time of 1.5 cm and valve area by continuity equation of 2.0 cm.  Despite the patient's elevated mean gradient, the color flow Doppler demonstrated laminar flow. The patient was treated with warfarin.  She had improvement in her shortness of breath.   She was admitted twice in 04/2018 for intractable pain related to her cancer and again in 05/2018 for general malaise and electrolyte imbalances. She was recently seen by her oncologist and doing much better.   Today she presents to clinic for follow up. 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 No CP. She has some mild shortness of breath when rushing around the funeral home. No LE edema, orthopnea or PND. No dizziness or syncope. No blood in stool or urine. No palpitations.    Past Medical History:  Diagnosis Date  . Arthritis    "everywhere"  . Asthma    as a child  . Automatic implantable cardioverter-defibrillator in situ    DR. Beckie Salts   . Bladder cancer [facility]) 2009   "injected medicine to get rid of it"  . Chronic systolic CHF (congestive heart failure) ([place])   . CKD (chronic kidney disease), stage III ([place])   . Coronary artery disease    a. large RV/inferior MI complicated by pseudoaneurysm s/p aneurysemectomy and CABG 10/2009 with papillary muscle rupture requiring bioprosthetic mitral valve replacement in [place].  . 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 H/O thymectomy   . High cholesterol   . Hypothyroidism   . ICD (implantable cardiac defibrillator) in place   . Ischemic cardiomyopathy    a.  ICM and VT arrest 10/2009 s/p AICD (revision 01/2012 due to RV lead problem).  . Myocardial infarction ([place]) 10/14/2009  . Neuropathy    FEET AND HANDS - FROM CHEMO - BUT MUCH IMPROVED  . PAF (paroxysmal atrial fibrillation) ([place])   . Pain    JOINT PAINS AND MUSCLE ACHES ALL OVER.  Marland Kitchen  Port-A-Cath in place    RIGHT UPPER CHEST  . Prosthetic valve dysfunction   . S/P mitral valve replacement with bioprosthetic valve 10/21/2009   Edwards Perimount stented bovine pericardial tissue valve, size 29 mm  . S/P valve-in-valve transcatheter mitral valve replacement 09/16/2017   29 mm Edwards Sapien 3 transcatheter heart valve placed via transapical approach  . SVT (supraventricular tachycardia) ([place])   . Tobacco abuse   . Ventricular tachycardia [facility])     Past Surgical History:  Procedure Laterality Date  . ABDOMINAL HYSTERECTOMY N/A 07/27/2013   Procedure: TUMOR EXCISION OF ABDOMINAL MASS;  Surgeon: Alvino Chapel, MD;  Location: [facility];  Service: Gynecology;  Laterality: N/A;  . APPENDECTOMY  1989  . BILATERAL OOPHORECTOMY  2007  . CARDIAC DEFIBRILLATOR PLACEMENT  02/06/2012   "lead change"  . CARDIAC VALVE REPLACEMENT    . CYSTOSCOPY Right 02/23/2013   Procedure: CYSTOSCOPY WITH STENT REMOVAL;  Surgeon: Alvino Chapel, MD;  Location: [facility];  Service: Gynecology;  Laterality: Right;  . IMPLANTABLE CARDIOVERTER DEFIBRILLATOR REVISION N/A 02/06/2012   Procedure: IMPLANTABLE CARDIOVERTER DEFIBRILLATOR REVISION;  Surgeon: Evans Lance, MD;  Location: [facility] CATH LAB;  Service: Cardiovascular;  Laterality: N/A;  . INSERT / REPLACE / REMOVE PACEMAKER  10/2009   DUAL-CHAMBER St. JUDE DEFIBRILLATOR  . IR IMAGING GUIDED PORT INSERTION  05/11/2018  . LAPAROTOMY N/A 01/19/2013   Procedure: TUMOR DEBULKING / BOWEL RESECTION/INSERTION RIGHT UTERERAL DOUBLE J STENT/OMENTECTOMY;  Surgeon: Alvino Chapel, MD;  Location: [facility];  Service: Gynecology;  Laterality: N/A;  . MITRAL VALVE REPLACEMENT  10/21/2009   23mm Edwards Perimount bovine pericardial tissue valve - [facility]  . RIGHT/LEFT HEART CATH AND CORONARY ANGIOGRAPHY N/A 07/02/2017   Procedure: RIGHT/LEFT HEART CATH AND CORONARY ANGIOGRAPHY;  Surgeon: Belva Crome, MD;  Location: [place] CV LAB;  Service:  Cardiovascular;  Laterality: N/A;  . TEE WITHOUT CARDIOVERSION N/A 09/16/2017   Procedure: TRANSESOPHAGEAL ECHOCARDIOGRAM (TEE);  Surgeon: [name], MD;  Location: [place];  Service: Open Heart Surgery;  Laterality: N/A;  . THYMECTOMY  2009  . TONSILLECTOMY AND ADENOIDECTOMY  ~ 1967  . TOTAL HIP ARTHROPLASTY  05/06/2012   Procedure: TOTAL HIP ARTHROPLASTY;  Surgeon: Gearlean Alf, MD;  Location: [facility];  Service: Orthopedics;  Laterality: Right;  . TRANSCATHETER MITRAL VALVE REPLACEMENT, TRANSAPICAL N/A 09/16/2017   Procedure: TRANSCATHETER MITRAL VALVE REPLACEMENT,TRANSAPICAL;  Surgeon: [name], MD;  Location: [place];  Service: Open Heart Surgery;  Laterality: N/A;  . TRANSURETHRAL RESECTION OF BLADDER  2009   "for bladder cancer"  . TUBAL LIGATION  1993  . VAGINAL HYSTERECTOMY  2001    Current Medications: Outpatient Medications Prior to Visit  Medication Sig Dispense Refill  . albuterol (PROVENTIL HFA;VENTOLIN HFA) 108 (90 Base) MCG/ACT inhaler Inhale 1 puff into the lungs every  6 (six) hours as needed for wheezing or shortness of breath.    Marland Kitchen amoxicillin (AMOXIL) 500 MG tablet Take 2,000 mg by mouth. Take 2,000 mg one hour prior to dental visits.    Marland Kitchen aspirin EC 81 MG tablet Take 81 mg by mouth daily.    . carvedilol (COREG) 3.125 MG tablet Take 1 tablet (3.125 mg total) by mouth 2 (two) times daily with a meal.    . lidocaine-prilocaine (EMLA) cream Apply 1 application topically as needed. Apply to port per instructions 30 g 0  . LORazepam (ATIVAN) 0.5 MG tablet TAKE 1 TABLET BY MOUTH EVERY 6 HOURS AS NEEDED FOR NAUSEA AND VOMITING 60 tablet 0  . methocarbamol (ROBAXIN) 500 MG tablet Take 1 tablet (500 mg total) by mouth every 6 (six) hours as needed for muscle spasms. 60 tablet 2  . oxyCODONE-acetaminophen (PERCOCET/ROXICET) 5-325 MG tablet Take 1-2 tablets by mouth every 4 (four) hours as needed for severe pain. 60 tablet 0  . warfarin (COUMADIN) 7.5 MG tablet TAKE 1  TABLET (7.5 MG TOTAL) BY MOUTH DAILY AT 6 PM. 90 tablet 0  . zolpidem (AMBIEN) 5 MG tablet TAKE 1 TABLET BY MOUTH EVERYDAY AT BEDTIME 30 tablet 2  . magic mouthwash w/lidocaine SOLN Take 5 mLs by mouth 3 (three) times daily as needed for mouth pain. (Patient not taking: Reported on 08/27/2018) 450 mL 2  . oxyCODONE (OXYCONTIN) 20 mg 12 hr tablet Take 1 tablet (20 mg total) by mouth every 12 (twelve) hours. (Patient not taking: Reported on 08/27/2018) 60 tablet 0  . potassium chloride SA (K-DUR,KLOR-CON) 20 MEQ tablet Take 1 tablet (20 mEq total) by mouth daily for 14 days. (please follow up repeat labs within 1 week) (Patient not taking: Reported on 08/27/2018) 14 tablet 0   No facility-administered medications prior to visit.      Allergies:   Prochlorperazine edisylate; Adhesive [tape]; and Prochlorperazine   Social History   Socioeconomic History  . Marital status: Divorced    Spouse name: Not on file  . Number of children: Not on file  . Years of education: Not on file  . Highest education level: Not on file  Occupational History    Comment: WORKS FULL TIME  Social Needs  . Financial resource strain: Not on file  . Food insecurity:    Worry: Not on file    Inability: Not on file  . Transportation needs:    Medical: Not on file    Non-medical: Not on file  Tobacco Use  . Smoking status: Current Some Day Smoker    Packs/day: 0.50    Years: 30.00    Pack years: 15.00    Types: Cigarettes    Start date: 03/28/1974  . Smokeless tobacco: Never Used  Substance and Sexual Activity  . Alcohol use: No    Alcohol/week: 0.0 standard drinks  . Drug use: No  . Sexual activity: Not on file  Lifestyle  . Physical activity:    Days per week: Not on file    Minutes per session: Not on file  . Stress: Not on file  Relationships  . Social connections:    Talks on phone: Patient refused    Gets together: Patient refused    Attends religious service: Patient refused    Active member of  club or organization: Patient refused    Attends meetings of clubs or organizations: Patient refused    Relationship status: Patient refused  Other Topics Concern  . Not  on file  Social History Narrative   WORKS FULL TIME   SINGLE   TOBACCO USE-YES   IMPLANTATION OF DUAL-CHAMBER St. JUDE DEFIBRILLATOR     Family History:  The patient's family history includes Heart attack in her brother, maternal grandfather, and paternal grandfather; Stroke in her maternal grandmother.      ROS:   Please see the history of present illness.    ROS All other systems reviewed and are negative.   PHYSICAL EXAM:   VS:  BP 130/60   Pulse 85   Ht 5\' 6"  (1.676 m)   Wt 180 lb 1.9 oz (81.7 kg)   SpO2 99%   BMI 29.07 kg/m    GEN: Well nourished, well developed, in no acute distress, obese  HEENT: normal Neck: no JVD or masses Cardiac: RRR; no murmurs, rubs, or gallops,no edema  Respiratory:  clear to auscultation bilaterally, normal work of breathing GI: soft, nontender, nondistended, + BS MS: no deformity or atrophy Skin: warm and dry, no rash Neuro:  Alert and Oriented x 3, Strength and sensation are intact Psych: euthymic mood, full affect   Wt Readings from Last 3 Encounters:  08/27/18 180 lb 1.9 oz (81.7 kg)  07/30/18 185 lb (83.9 kg)  07/23/18 181 lb 8 oz (82.3 kg)      Studies/Labs Reviewed:   EKG:  EKG is NOT ordered today.    Recent Labs: 09/12/2017: B Natriuretic Peptide 327.3 06/11/2018: Magnesium 1.2 07/30/2018: ALT 10; BUN 12; Creatinine 1.18; Hemoglobin 10.8; Platelet Count 155; Potassium 3.0; Sodium 138   Lipid Panel    Component Value Date/Time   CHOL 247 (H) 09/18/2017 0240   TRIG 144 09/18/2017 0240   HDL 37 (L) 09/18/2017 0240   CHOLHDL 6.7 09/18/2017 0240   VLDL 29 09/18/2017 0240   LDLCALC 181 (H) 09/18/2017 0240    Additional studies/ records that were reviewed today include:  CARDIOTHORACIC SURGERY OPERATIVE NOTE  Date of Procedure:                 09/16/2017  Preoperative Diagnosis:        Prosthetic valve dysfunction s/p mitral valve replacement using a bioprosthetic tissue valve  Severe Mitral Stenosis  Mild Mitral Regurgitation  Postoperative Diagnosis:    Same   Procedure:        Valve-in-Valve Transcatheter Mitral Valve Replacement - Transapical Approach             Edwards Sapien 3 THV (size 29 mm, model # 9600TFX, serial # [id number])              Co-Surgeons:                        Valentina Gu. Roxy Manns, MD and [name], MD  Anesthesiologist:                  Arabella Merles, MD  Echocardiographer:              Ena Dawley, MD  Pre-operative Echo Findings: ? Prosthetic valve dysfunction of bioprosthetic mitral valve  ? Severe mitral stenosis ? Mild mitral regurgitation ? Moderate left ventricular systolic dysfunction  Post-operative Echo Findings: ? Trivial paravalvular leak ? Unchanged left ventricular systolic function  _____________  Echo 10/31/17 Study Conclusions - Left ventricle: The cavity size was normal. Wall thickness was   normal. Indeterminant diastolic function. Basal inferior   aneurysm. The estimated ejection fraction was 45%. - Aortic valve: There was  no stenosis. - Mitral valve: Bioprosthetic mitral valve s/p valve-in-valve   transcatheter mitral valve replacement. Short pressure half-time,   doubt clinically significant stenosis. There was no significant   regurgitation. Pressure half-time: 71 ms. Mean gradient (D): 7 mm   Hg. Valve area by pressure half-time: 3.1 cm^2. - Left atrium: The atrium was mildly dilated. - Right ventricle: The cavity size was normal. Pacer wire or   catheter noted in right ventricle. Systolic function was mildly   reduced. - Pulmonary arteries: No complete TR doppler jet so unable to   estimate PA systolic pressure. - Inferior vena cava: The vessel was normal in size. The   respirophasic diameter changes were in the normal range (>=  50%),   consistent with normal central venous pressure. Impressions: - Normal LV size with EF 45%. Basal inferior aneurysm. Normal RV   size with mildly decreased systolic function. Bioprosthetic   mitral valve s/p valve-in-valve transcatheter mitral valve   replacement. Mean gradient 7 mmHg but with low pressure half-time   doubt clinically significant stenosis. No mitral regurgitation   noted.  _____________  Echo 08/27/2018 IMPRESSIONS  1. The left ventricle has mild-moderately reduced systolic function, with an ejection fraction of 40-45%. The cavity size was mildly dilated. Left ventricular diastolic Doppler parameters are indeterminate.  2. Septal hypokinesis inferior basal aneurysm.  3. The right ventricle has normal systolic function. The cavity was normal. There is no increase in right ventricular wall thickness.  4. Pacing wires in RA/RV.  5. Left atrial size was moderately dilated.  6. Post bioprosthetic MVR and subsequent valve in vlave replacement No significant MR and stabel mean gradient 6 mmHg.  7. The tricuspid valve is normal in structure.  8. The aortic valve is tricuspid Moderate thickening of the aortic valve Moderate calcification of the aortic valve.  9. The pulmonic valve was grossly normal. Pulmonic valve regurgitation is mild by color flow Doppler. 10. The aortic root is normal in size and structure.   ASSESSMENT & PLAN:   Severe bioprosthetic mitral valve stenosis s/p TMVR: Echo today shows EF 40-45%, normally functioning mitral valve with mean gradient of 6 mm Hg. She has NYHA class II symptoms that she thinks is mostly related to her chemotherapy. Continue on aspirin 81mg  daily and coumadin. SBE prophylaxis discussed; she gets amoxicillin from the denist. She will continue her regular follow up with Dr. Tamala Julian    Medication Adjustments/Labs and Tests Ordered: Current medicines are reviewed at length with the patient today.  Concerns regarding medicines are  outlined above.  Medication changes, Labs and Tests ordered today are listed in the Patient Instructions below. Patient Instructions  Medication Instructions:  Your provider recommends that you continue on your current medications as directed. Please refer to the Current Medication list given to you today.    Labwork: None  Testing/Procedures: None  Follow-Up: Your provider wants you to follow-up in: 6 months with Dr. Tamala Julian. You will receive a reminder letter in the mail two months in advance. If you don't receive a letter, please call our office to schedule the follow-up appointment.      Signed, Angelena Form, PA-C  08/27/2018 4:37 PM    [facility] [address], [place], [place]  [address] Phone: [phone]; Fax: [phone]

## 2018-08-27 NOTE — Patient Instructions (Signed)
Medication Instructions:  Your provider recommends that you continue on your current medications as directed. Please refer to the Current Medication list given to you today.    Labwork: None  Testing/Procedures: None  Follow-Up: Your provider wants you to follow-up in: 6 months with Dr. Tamala Julian. You will receive a reminder letter in the mail two months in advance. If you don't receive a letter, please call our office to schedule the follow-up appointment.

## 2018-09-02 ENCOUNTER — Other Ambulatory Visit: Payer: Self-pay | Admitting: *Deleted

## 2018-09-02 MED ORDER — OXYCODONE-ACETAMINOPHEN 5-325 MG PO TABS: 1.0000 | ORAL_TABLET | ORAL | 0 refills | Status: DC | PRN

## 2018-09-03 ENCOUNTER — Other Ambulatory Visit: Payer: Self-pay | Admitting: *Deleted

## 2018-09-03 DIAGNOSIS — D3910 Neoplasm of uncertain behavior of unspecified ovary: Secondary | ICD-10-CM

## 2018-09-03 MED ORDER — GABAPENTIN 300 MG PO CAPS: ORAL_CAPSULE | ORAL | 1 refills | Status: DC

## 2018-09-05 ENCOUNTER — Other Ambulatory Visit: Payer: Self-pay | Admitting: Family

## 2018-09-10 ENCOUNTER — Other Ambulatory Visit: Payer: Self-pay

## 2018-09-10 ENCOUNTER — Ambulatory Visit (INDEPENDENT_AMBULATORY_CARE_PROVIDER_SITE_OTHER): Payer: BLUE CROSS/BLUE SHIELD | Admitting: *Deleted

## 2018-09-10 DIAGNOSIS — I472 Ventricular tachycardia: Secondary | ICD-10-CM | POA: Diagnosis not present

## 2018-09-10 DIAGNOSIS — I5022 Chronic systolic (congestive) heart failure: Secondary | ICD-10-CM

## 2018-09-10 DIAGNOSIS — I4729 Other ventricular tachycardia: Secondary | ICD-10-CM

## 2018-09-11 LAB — CUP PACEART REMOTE DEVICE CHECK
Battery Remaining Longevity: 18 mo
Battery Remaining Percentage: 21 %
Battery Voltage: 2.78 V
Brady Statistic AP VP Percent: 1 %
Brady Statistic AS VP Percent: 2.6 %
Brady Statistic RV Percent Paced: 2.5 %
Date Time Interrogation Session: 20200319080012
HighPow Impedance: 60 Ohm
HighPow Impedance: 60 Ohm
Implantable Lead Implant Date: 20130815
Implantable Lead Location: 753859
Implantable Lead Location: 753860
Implantable Pulse Generator Implant Date: 20110503
Lead Channel Impedance Value: 240 Ohm
Lead Channel Impedance Value: 360 Ohm
Lead Channel Pacing Threshold Amplitude: 0.75 V
Lead Channel Pacing Threshold Amplitude: 1.5 V
Lead Channel Pacing Threshold Pulse Width: 0.5 ms
Lead Channel Pacing Threshold Pulse Width: 1.3 ms
Lead Channel Sensing Intrinsic Amplitude: 1.9 mV
Lead Channel Sensing Intrinsic Amplitude: 10.5 mV
Lead Channel Setting Pacing Amplitude: 2 V
Lead Channel Setting Sensing Sensitivity: 0.5 mV
MDC IDC LEAD IMPLANT DT: 20110503
MDC IDC SET LEADCHNL RV PACING AMPLITUDE: 3 V
MDC IDC SET LEADCHNL RV PACING PULSEWIDTH: 1.3 ms
MDC IDC STAT BRADY AP VS PERCENT: 1 %
MDC IDC STAT BRADY AS VS PERCENT: 97 %
MDC IDC STAT BRADY RA PERCENT PACED: 1 %
Pulse Gen Serial Number: 778164

## 2018-09-14 ENCOUNTER — Inpatient Hospital Stay: Payer: BLUE CROSS/BLUE SHIELD

## 2018-09-14 ENCOUNTER — Other Ambulatory Visit: Payer: Self-pay

## 2018-09-14 ENCOUNTER — Ambulatory Visit (HOSPITAL_BASED_OUTPATIENT_CLINIC_OR_DEPARTMENT_OTHER)
Admission: RE | Admit: 2018-09-14 | Discharge: 2018-09-14 | Disposition: A | Discharge status: Home / Self Care | Payer: BLUE CROSS/BLUE SHIELD | Source: Ambulatory Visit | Attending: Hematology & Oncology | Admitting: Hematology & Oncology

## 2018-09-14 ENCOUNTER — Inpatient Hospital Stay: Payer: BLUE CROSS/BLUE SHIELD | Attending: Hematology & Oncology | Admitting: Hematology & Oncology

## 2018-09-14 ENCOUNTER — Encounter: Payer: Self-pay | Admitting: Hematology & Oncology

## 2018-09-14 ENCOUNTER — Telehealth: Payer: Self-pay | Admitting: Hematology & Oncology

## 2018-09-14 VITALS — BP 104/62 | HR 86 | Temp 97.9°F | Resp 20 | Wt 185.0 lb

## 2018-09-14 DIAGNOSIS — D3910 Neoplasm of uncertain behavior of unspecified ovary: Secondary | ICD-10-CM

## 2018-09-14 DIAGNOSIS — C569 Malignant neoplasm of unspecified ovary: Secondary | ICD-10-CM

## 2018-09-14 LAB — CBC WITH DIFFERENTIAL (CANCER CENTER ONLY)
Abs Immature Granulocytes: 0.04 10*3/uL (ref 0.00–0.07)
Basophils Absolute: 0 10*3/uL (ref 0.0–0.1)
Basophils Relative: 1 %
Eosinophils Absolute: 0.1 10*3/uL (ref 0.0–0.5)
Eosinophils Relative: 1 %
HCT: 38 % (ref 36.0–46.0)
Hemoglobin: 12.9 g/dL (ref 12.0–15.0)
Immature Granulocytes: 1 %
Lymphocytes Relative: 6 %
Lymphs Abs: 0.5 10*3/uL — ABNORMAL LOW (ref 0.7–4.0)
MCH: 32.9 pg (ref 26.0–34.0)
MCHC: 33.9 g/dL (ref 30.0–36.0)
MCV: 96.9 fL (ref 80.0–100.0)
Monocytes Absolute: 0.6 10*3/uL (ref 0.1–1.0)
Monocytes Relative: 7 %
Neutro Abs: 7.1 10*3/uL (ref 1.7–7.7)
Neutrophils Relative %: 84 %
Platelet Count: 150 10*3/uL (ref 150–400)
RBC: 3.92 MIL/uL (ref 3.87–5.11)
RDW: 13.1 % (ref 11.5–15.5)
WBC Count: 8.5 10*3/uL (ref 4.0–10.5)
nRBC: 0 % (ref 0.0–0.2)

## 2018-09-14 LAB — CMP (CANCER CENTER ONLY)
ALT: 12 U/L (ref 0–44)
AST: 17 U/L (ref 15–41)
Albumin: 4.1 g/dL (ref 3.5–5.0)
Alkaline Phosphatase: 45 U/L (ref 38–126)
Anion gap: 10 (ref 5–15)
BUN: 12 mg/dL (ref 6–20)
CO2: 29 mmol/L (ref 22–32)
Calcium: 8.9 mg/dL (ref 8.9–10.3)
Chloride: 98 mmol/L (ref 98–111)
Creatinine: 1.07 mg/dL — ABNORMAL HIGH (ref 0.44–1.00)
GFR, Est AFR Am: >60 mL/min (ref >60)
GFR, Estimated: 57 mL/min — ABNORMAL LOW (ref >60)
Glucose, Bld: 90 mg/dL (ref 70–99)
Potassium: 3.3 mmol/L — ABNORMAL LOW (ref 3.5–5.1)
Sodium: 137 mmol/L (ref 135–145)
Total Bilirubin: 0.3 mg/dL (ref 0.3–1.2)
Total Protein: 6.2 g/dL — ABNORMAL LOW (ref 6.5–8.1)

## 2018-09-14 LAB — LACTATE DEHYDROGENASE: LDH: 184 U/L (ref 98–192)

## 2018-09-14 MED ORDER — IOHEXOL 300 MG/ML  SOLN
100.0000 mL | Freq: Once | INTRAMUSCULAR | Status: AC | PRN
  Administered 2018-09-14: 100 mL via INTRAVENOUS

## 2018-09-14 MED ORDER — SODIUM CHLORIDE 0.9% FLUSH
10.0000 mL | Freq: Once | INTRAVENOUS | Status: AC
  Administered 2018-09-14: 10 mL
  Filled 2018-09-14: qty 10

## 2018-09-14 MED ORDER — HEPARIN SOD (PORK) LOCK FLUSH 100 UNIT/ML IV SOLN
500.0000 [IU] | Freq: Once | INTRAVENOUS | Status: AC
  Administered 2018-09-14: 500 [IU] via INTRAVENOUS
  Filled 2018-09-14: qty 5

## 2018-09-14 MED ORDER — SODIUM CHLORIDE 0.9% FLUSH
10.0000 mL | INTRAVENOUS | Status: AC | PRN
  Administered 2018-09-14: 10 mL via INTRAVENOUS
  Filled 2018-09-14: qty 10

## 2018-09-14 MED ORDER — ALBUTEROL SULFATE HFA 108 (90 BASE) MCG/ACT IN AERS: 2.0000 | INHALATION_SPRAY | Freq: Four times a day (QID) | RESPIRATORY_TRACT | 2 refills | Status: DC | PRN

## 2018-09-14 NOTE — Patient Instructions (Signed)

## 2018-09-14 NOTE — Addendum Note (Signed)
Addended by: Perlie Gold on: 09/14/2018 11:52 AM   Modules accepted: Orders, SmartSet

## 2018-09-14 NOTE — Progress Notes (Signed)
Hematology and Oncology Follow Up Visit  [name] [id number] 1960/11/23 58 y.o. 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  She is doing pretty well.  She has really had very few problems to date.  She is still recovering from her chemo/radiation therapy that she got for her recurrent granulosa cell tumor.  We had completed all this back in December 2019.  We did a CT scan of her body today.  Thankfully, it still shows regression of her tumors in the pelvic and right paracolic gutter.  No new tumors are noted.  Her inhibin B level has been coming down.   Back in February, the level was down to 15.  She is still working.  She is having no problems with cough.  She does have some fatigue but she feels this is slowly getting better.  There is been no bleeding.  She has had no change in bowel or bladder habits.  She still has discomfort when she sits for a long time.  She is on oxycodone.  This is helping her pain and allowing her to be working and functional.  Her appetite is doing pretty well.  Overall, her performance status is ECOG 1.  Medications:  Allergies as of 09/14/2018      Reactions   Prochlorperazine Edisylate Other (See Comments)   Nervous/ flutter/ shakes--compazine   Adhesive [tape] Other (See Comments)   Redness/skin peeling Redness/hives from adhesive tape( tolerates latex gloves); tolerates paper tape   Prochlorperazine Other (See Comments)   Jitters, hyper      Medication List       Accurate as of September 14, 2018 11:29 AM. Always use your most recent med list.        amoxicillin 500 MG tablet Commonly known as:  AMOXIL Take 2,000 mg by mouth. Take 2,000 mg one hour prior to dental visits.   aspirin EC 81 MG tablet Take 81 mg by mouth daily.   carvedilol 3.125  MG tablet Commonly known as:  COREG Take 1 tablet (3.125 mg total) by mouth 2 (two) times daily with a meal.   lidocaine-prilocaine cream Commonly known as:  EMLA Apply 1 application topically as needed. Apply to port per instructions   LORazepam 0.5 MG tablet Commonly known as:  ATIVAN TAKE 1 TABLET BY MOUTH EVERY 6 HOURS AS NEEDED FOR NAUSEA AND VOMITING   methocarbamol 500 MG tablet Commonly known as:  ROBAXIN Take 1 tablet (500 mg total) by mouth every 6 (six) hours as needed for muscle spasms.   oxyCODONE-acetaminophen 5-325 MG tablet Commonly known as:  PERCOCET/ROXICET Take 1-2 tablets by mouth every 4 (four) hours as needed for severe pain.   warfarin 7.5 MG tablet Commonly known as:  COUMADIN TAKE 1 TABLET (7.5 MG TOTAL) BY MOUTH DAILY AT 6 PM.   zolpidem 5 MG tablet Commonly known as:  AMBIEN TAKE 1 TABLET BY MOUTH EVERYDAY AT BEDTIME       Allergies:  Allergies  Allergen Reactions  . Prochlorperazine Edisylate Other (See Comments)    Nervous/ flutter/ shakes--compazine  . Adhesive [Tape] Other (See Comments)    Redness/skin peeling Redness/hives from adhesive tape( tolerates latex gloves); tolerates paper tape  . Prochlorperazine Other (See Comments)    Jitters, hyper    Past Medical History, Surgical history, Social history,  and Family History were reviewed and updated.  Review of Systems:  Review of Systems  Constitutional: Negative.   HENT: Negative.   Eyes: Negative.   Respiratory: Negative.   Cardiovascular: Negative.   Gastrointestinal: Negative.   Genitourinary: Negative.   Musculoskeletal: Negative.   Skin: Negative.   Neurological: Negative.   Endo/Heme/Allergies: Negative.   Psychiatric/Behavioral: Negative.       Exam:  weight is 185 lb (83.9 kg). Her oral temperature is 97.9 F (36.6 C). Her blood pressure is 104/62 and her pulse is 86. Her respiration is 20 and oxygen saturation is 99%.   Wt Readings from Last 3 Encounters:   09/14/18 185 lb (83.9 kg)  08/27/18 180 lb 1.9 oz (81.7 kg)  07/30/18 185 lb (83.9 kg)    Physical Exam Vitals signs reviewed.  HENT:     Head: Normocephalic and atraumatic.  Eyes:     Pupils: Pupils are equal, round, and reactive to light.  Neck:     Musculoskeletal: Normal range of motion.  Cardiovascular:     Rate and Rhythm: Normal rate and regular rhythm.     Heart sounds: Normal heart sounds.  Pulmonary:     Effort: Pulmonary effort is normal.     Breath sounds: Normal breath sounds.  Abdominal:     General: Bowel sounds are normal.     Palpations: Abdomen is soft.  Musculoskeletal: Normal range of motion.        General: No tenderness or deformity.  Lymphadenopathy:     Cervical: No cervical adenopathy.  Skin:    General: Skin is warm and dry.     Findings: No erythema or rash.  Neurological:     Mental Status: She is alert and oriented to person, place, and time.  Psychiatric:        Behavior: Behavior normal.        Thought Content: Thought content normal.        Judgment: Judgment normal.      Lab Results  Component Value Date   WBC 8.5 09/14/2018   HGB 12.9 09/14/2018   HCT 38.0 09/14/2018   MCV 96.9 09/14/2018   PLT 150 09/14/2018   Lab Results  Component Value Date   FERRITIN 181 07/24/2018   IRON 69 07/24/2018   TIBC 286 07/24/2018   UIBC 217 07/24/2018   IRONPCTSAT 24 07/24/2018   Lab Results  Component Value Date   RETICCTPCT 3.1 07/24/2018   RBC 3.92 09/14/2018   No results found for: KPAFRELGTCHN, LAMBDASER, KAPLAMBRATIO No results found for: Kandis Cocking, IGMSERUM No results found for: Odetta Pink, SPEI   Chemistry      Component Value Date/Time   NA 137 09/14/2018 0923   NA 140 07/21/2017 1120   NA 140 03/15/2016 0953   K 3.3 (L) 09/14/2018 0923   K 3.9 03/15/2016 0953   CL 98 09/14/2018 0923   CL 102 12/13/2015 0803   CO2 29 09/14/2018 0923   CO2 19 (L) 03/15/2016  0953   BUN 12 09/14/2018 0923   BUN 13 07/21/2017 1120   BUN 15.9 03/15/2016 0953   CREATININE 1.07 (H) 09/14/2018 0923   CREATININE 1.4 (H) 03/15/2016 0953      Component Value Date/Time   CALCIUM 8.9 09/14/2018 0923   CALCIUM 9.3 03/15/2016 0953   ALKPHOS 45 09/14/2018 0923   ALKPHOS 55 03/15/2016 0953   AST 17 09/14/2018 0923   AST 17 03/15/2016 0953  ALT 12 09/14/2018 0923   ALT 23 03/15/2016 0953   BILITOT 0.3 09/14/2018 0923   BILITOT 0.38 03/15/2016 0953       Impression and Plan: Ms. Like is a very pleasant 58 yo caucasian female with recurrent granulosa cell tumor.  Hopefully, we will see continued improvement in her tumor.  We will set her up with another CT scan at the end of May.  Hopefully, she will continue to make improvement.  I just want her to have a good quality of life.  She definitely deserves this.  If we do find a recurrence again, I am not sure how we will manage this.Volanda Napoleon, MD 3/23/202011:29 AM

## 2018-09-14 NOTE — Telephone Encounter (Signed)
sw pt to confirm 5/29 appts at 1030 am per 3/23 LOS

## 2018-09-15 LAB — INHIBIN B: Inhibin B: 7.5 pg/mL (ref 0.0–16.9)

## 2018-09-16 ENCOUNTER — Telehealth: Payer: Self-pay | Admitting: *Deleted

## 2018-09-16 NOTE — Telephone Encounter (Signed)
As noted below by Dr. Marin Olp, I informed patient that her tumor marker is now 7.5. She verbalized understanding.

## 2018-09-16 NOTE — Telephone Encounter (Signed)
-----   Message from Volanda Napoleon, MD sent at 09/16/2018  6:42 AM EDT ----- Call - tumor marker -- Inhibin B --is now 7!!!!  Great news!!!  [name]

## 2018-09-17 NOTE — Progress Notes (Signed)
Remote ICD transmission.   

## 2018-09-25 ENCOUNTER — Encounter: Payer: Self-pay | Admitting: Thoracic Surgery (Cardiothoracic Vascular Surgery)

## 2018-10-02 ENCOUNTER — Other Ambulatory Visit: Payer: Self-pay | Admitting: Family

## 2018-10-07 ENCOUNTER — Other Ambulatory Visit: Payer: Self-pay | Admitting: Hematology & Oncology

## 2018-10-26 ENCOUNTER — Other Ambulatory Visit: Payer: Self-pay | Admitting: Hematology & Oncology

## 2018-10-26 DIAGNOSIS — D3910 Neoplasm of uncertain behavior of unspecified ovary: Secondary | ICD-10-CM

## 2018-10-28 ENCOUNTER — Other Ambulatory Visit: Payer: Self-pay | Admitting: *Deleted

## 2018-10-28 MED ORDER — LORAZEPAM 0.5 MG PO TABS: 0.5000 mg | ORAL_TABLET | Freq: Four times a day (QID) | ORAL | 0 refills | Status: DC | PRN

## 2018-10-28 MED ORDER — METHOCARBAMOL 500 MG PO TABS: 500.0000 mg | ORAL_TABLET | Freq: Four times a day (QID) | ORAL | 2 refills | Status: DC | PRN

## 2018-10-31 ENCOUNTER — Other Ambulatory Visit: Payer: Self-pay | Admitting: Interventional Cardiology

## 2018-11-17 ENCOUNTER — Telehealth: Payer: Self-pay | Admitting: Hematology & Oncology

## 2018-11-17 NOTE — Telephone Encounter (Signed)
LMVM for patient regarding appointments being rescheduled to new times per 5/26 sch msg

## 2018-11-18 ENCOUNTER — Other Ambulatory Visit (HOSPITAL_BASED_OUTPATIENT_CLINIC_OR_DEPARTMENT_OTHER): Payer: Self-pay | Admitting: Physician Assistant

## 2018-11-20 ENCOUNTER — Inpatient Hospital Stay: Payer: BLUE CROSS/BLUE SHIELD | Attending: Hematology & Oncology

## 2018-11-20 ENCOUNTER — Inpatient Hospital Stay: Payer: BLUE CROSS/BLUE SHIELD

## 2018-11-20 ENCOUNTER — Telehealth: Payer: Self-pay | Admitting: *Deleted

## 2018-11-20 ENCOUNTER — Telehealth: Payer: Self-pay | Admitting: Hematology & Oncology

## 2018-11-20 ENCOUNTER — Other Ambulatory Visit: Payer: BLUE CROSS/BLUE SHIELD

## 2018-11-20 ENCOUNTER — Inpatient Hospital Stay (HOSPITAL_BASED_OUTPATIENT_CLINIC_OR_DEPARTMENT_OTHER): Payer: BLUE CROSS/BLUE SHIELD | Admitting: Hematology & Oncology

## 2018-11-20 ENCOUNTER — Ambulatory Visit (HOSPITAL_BASED_OUTPATIENT_CLINIC_OR_DEPARTMENT_OTHER): Payer: BLUE CROSS/BLUE SHIELD

## 2018-11-20 ENCOUNTER — Ambulatory Visit: Payer: BLUE CROSS/BLUE SHIELD | Admitting: Hematology & Oncology

## 2018-11-20 ENCOUNTER — Other Ambulatory Visit: Payer: Self-pay

## 2018-11-20 DIAGNOSIS — Z95828 Presence of other vascular implants and grafts: Secondary | ICD-10-CM

## 2018-11-20 DIAGNOSIS — Z953 Presence of xenogenic heart valve: Secondary | ICD-10-CM

## 2018-11-20 DIAGNOSIS — Z923 Personal history of irradiation: Secondary | ICD-10-CM | POA: Insufficient documentation

## 2018-11-20 DIAGNOSIS — D3910 Neoplasm of uncertain behavior of unspecified ovary: Secondary | ICD-10-CM | POA: Diagnosis not present

## 2018-11-20 DIAGNOSIS — Z9221 Personal history of antineoplastic chemotherapy: Secondary | ICD-10-CM | POA: Diagnosis not present

## 2018-11-20 DIAGNOSIS — R52 Pain, unspecified: Secondary | ICD-10-CM | POA: Insufficient documentation

## 2018-11-20 DIAGNOSIS — Z888 Allergy status to other drugs, medicaments and biological substances status: Secondary | ICD-10-CM

## 2018-11-20 DIAGNOSIS — Z7901 Long term (current) use of anticoagulants: Secondary | ICD-10-CM | POA: Insufficient documentation

## 2018-11-20 DIAGNOSIS — Z9223 Personal history of estrogen therapy: Secondary | ICD-10-CM

## 2018-11-20 LAB — CBC WITH DIFFERENTIAL (CANCER CENTER ONLY)
Abs Immature Granulocytes: 0.03 10*3/uL (ref 0.00–0.07)
Basophils Absolute: 0 10*3/uL (ref 0.0–0.1)
Basophils Relative: 1 %
Eosinophils Absolute: 0.1 10*3/uL (ref 0.0–0.5)
Eosinophils Relative: 2 %
HCT: 38.3 % (ref 36.0–46.0)
Hemoglobin: 13.1 g/dL (ref 12.0–15.0)
Immature Granulocytes: 1 %
Lymphocytes Relative: 9 %
Lymphs Abs: 0.6 10*3/uL — ABNORMAL LOW (ref 0.7–4.0)
MCH: 32.2 pg (ref 26.0–34.0)
MCHC: 34.2 g/dL (ref 30.0–36.0)
MCV: 94.1 fL (ref 80.0–100.0)
Monocytes Absolute: 0.6 10*3/uL (ref 0.1–1.0)
Monocytes Relative: 10 %
Neutro Abs: 4.9 10*3/uL (ref 1.7–7.7)
Neutrophils Relative %: 77 %
Platelet Count: 161 10*3/uL (ref 150–400)
RBC: 4.07 MIL/uL (ref 3.87–5.11)
RDW: 13.8 % (ref 11.5–15.5)
WBC Count: 6.4 10*3/uL (ref 4.0–10.5)
nRBC: 0 % (ref 0.0–0.2)

## 2018-11-20 LAB — CMP (CANCER CENTER ONLY)
ALT: 14 U/L (ref 0–44)
AST: 16 U/L (ref 15–41)
Albumin: 4.2 g/dL (ref 3.5–5.0)
Alkaline Phosphatase: 52 U/L (ref 38–126)
Anion gap: 9 (ref 5–15)
BUN: 16 mg/dL (ref 6–20)
CO2: 29 mmol/L (ref 22–32)
Calcium: 9.1 mg/dL (ref 8.9–10.3)
Chloride: 100 mmol/L (ref 98–111)
Creatinine: 1.21 mg/dL — ABNORMAL HIGH (ref 0.44–1.00)
GFR, Est AFR Am: 57 mL/min — ABNORMAL LOW (ref >60)
GFR, Estimated: 49 mL/min — ABNORMAL LOW (ref >60)
Glucose, Bld: 90 mg/dL (ref 70–99)
Potassium: 3.5 mmol/L (ref 3.5–5.1)
Sodium: 138 mmol/L (ref 135–145)
Total Bilirubin: 0.5 mg/dL (ref 0.3–1.2)
Total Protein: 6.8 g/dL (ref 6.5–8.1)

## 2018-11-20 LAB — PROTIME-INR
INR: 2.5 — ABNORMAL HIGH (ref 0.8–1.2)
Prothrombin Time: 26.3 seconds — ABNORMAL HIGH (ref 11.4–15.2)

## 2018-11-20 MED ORDER — TRAMADOL HCL 50 MG PO TABS: 50.0000 mg | ORAL_TABLET | Freq: Four times a day (QID) | ORAL | 0 refills | Status: DC | PRN

## 2018-11-20 MED ORDER — HEPARIN SOD (PORK) LOCK FLUSH 100 UNIT/ML IV SOLN
500.0000 [IU] | Freq: Once | INTRAVENOUS | Status: AC
  Administered 2018-11-20: 10:00:00 500 [IU] via INTRAVENOUS
  Filled 2018-11-20: qty 5

## 2018-11-20 MED ORDER — SODIUM CHLORIDE 0.9% FLUSH
10.0000 mL | Freq: Once | INTRAVENOUS | Status: AC
  Administered 2018-11-20: 10 mL via INTRAVENOUS
  Filled 2018-11-20: qty 10

## 2018-11-20 MED ORDER — WARFARIN SODIUM 7.5 MG PO TABS: 7.5000 mg | ORAL_TABLET | Freq: Every day | ORAL | 0 refills | Status: DC

## 2018-11-20 NOTE — Patient Instructions (Signed)

## 2018-11-20 NOTE — Progress Notes (Signed)
Hematology and Oncology Follow Up Visit  [name] [id number] 11-Apr-1961 58 y.o. 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  She is doing pretty well.  She has really had very few problems to date.  She is still recovering from her chemo/radiation therapy that she got for her recurrent granulosa cell tumor.  We had completed all this back in December 2019.  Her last inhibin B levels was 7.5.  This is amazing.  I will think a level is never been this low.  Because of her low inhibin B level, she does not want to have a CT scan done.  I can understand this.  I know why give her added expense if we think the CT scan has not been would be helpful.  She is still working.  Is having no problems with nausea or vomiting.  Ultram is helping her with pain.  She is on Coumadin because of a mitral valve.  We are checking her INR today.  However, cardiology typically does this and we will make sure that cardiology continues to monitor her INR since the Coumadin is for a Cardiologic issue.  Overall, her performance status is ECOG 1.  Medications:  Allergies as of 11/20/2018      Reactions   Prochlorperazine Edisylate Other (See Comments)   Nervous/ flutter/ shakes--compazine   Adhesive [tape] Other (See Comments)   Redness/skin peeling Redness/hives from adhesive tape( tolerates latex gloves); tolerates paper tape   Prochlorperazine Other (See Comments)   Jitters, hyper      Medication List       Accurate as of Nov 20, 2018 10:59 AM. If you have any questions, ask your nurse or doctor.        albuterol 108 (90 Base) MCG/ACT inhaler Commonly known as:  VENTOLIN HFA Inhale 2 puffs into the lungs every 6 (six) hours as needed for wheezing or shortness of breath.   amoxicillin 500 MG tablet Commonly  known as:  AMOXIL Take 2,000 mg by mouth. Take 2,000 mg one hour prior to dental visits.   aspirin EC 81 MG tablet Take 81 mg by mouth daily.   carvedilol 3.125 MG tablet Commonly known as:  COREG TAKE 1 TABLET (3.125 MG TOTAL) BY MOUTH 2 (TWO) TIMES DAILY WITH A MEAL.   lidocaine-prilocaine cream Commonly known as:  EMLA Apply 1 application topically as needed. Apply to port per instructions   LORazepam 0.5 MG tablet Commonly known as:  ATIVAN Take 1 tablet (0.5 mg total) by mouth every 6 (six) hours as needed for anxiety.   methocarbamol 500 MG tablet Commonly known as:  ROBAXIN Take 1 tablet (500 mg total) by mouth every 6 (six) hours as needed for muscle spasms.   oxyCODONE-acetaminophen 5-325 MG tablet Commonly known as:  PERCOCET/ROXICET Take 1-2 tablets by mouth every 4 (four) hours as needed for severe pain.   traMADol 50 MG tablet Commonly known as:  ULTRAM TAKE 1 TABLET (50 MG TOTAL) BY MOUTH EVERY 6 (SIX) HOURS AS NEEDED.   Vitamin D (Ergocalciferol) 1.25 MG (50000 UT) Caps capsule Commonly known as:  DRISDOL TAKE 1 CAPSULE (50,000 UNITS TOTAL) BY MOUTH EVERY 7 (SEVEN) DAYS.   warfarin 7.5 MG tablet Commonly known as:  COUMADIN TAKE 1 TABLET (7.5 MG TOTAL) BY MOUTH DAILY AT 6 PM.  zolpidem 5 MG tablet Commonly known as:  AMBIEN TAKE 1 TABLET BY MOUTH EVERYDAY AT BEDTIME       Allergies:  Allergies  Allergen Reactions  . Prochlorperazine Edisylate Other (See Comments)    Nervous/ flutter/ shakes--compazine  . Adhesive [Tape] Other (See Comments)    Redness/skin peeling Redness/hives from adhesive tape( tolerates latex gloves); tolerates paper tape  . Prochlorperazine Other (See Comments)    Jitters, hyper    Past Medical History, Surgical history, Social history, and Family History were reviewed and updated.  Review of Systems:  Review of Systems  Constitutional: Negative.   HENT: Negative.   Eyes: Negative.   Respiratory: Negative.    Cardiovascular: Negative.   Gastrointestinal: Negative.   Genitourinary: Negative.   Musculoskeletal: Negative.   Skin: Negative.   Neurological: Negative.   Endo/Heme/Allergies: Negative.   Psychiatric/Behavioral: Negative.       Exam:  vitals were not taken for this visit.   Wt Readings from Last 3 Encounters:  09/14/18 185 lb (83.9 kg)  08/27/18 180 lb 1.9 oz (81.7 kg)  07/30/18 185 lb (83.9 kg)    Physical Exam Vitals signs reviewed.  HENT:     Head: Normocephalic and atraumatic.  Eyes:     Pupils: Pupils are equal, round, and reactive to light.  Neck:     Musculoskeletal: Normal range of motion.  Cardiovascular:     Rate and Rhythm: Normal rate and regular rhythm.     Heart sounds: Normal heart sounds.  Pulmonary:     Effort: Pulmonary effort is normal.     Breath sounds: Normal breath sounds.  Abdominal:     General: Bowel sounds are normal.     Palpations: Abdomen is soft.  Musculoskeletal: Normal range of motion.        General: No tenderness or deformity.  Lymphadenopathy:     Cervical: No cervical adenopathy.  Skin:    General: Skin is warm and dry.     Findings: No erythema or rash.  Neurological:     Mental Status: She is alert and oriented to person, place, and time.  Psychiatric:        Behavior: Behavior normal.        Thought Content: Thought content normal.        Judgment: Judgment normal.      Lab Results  Component Value Date   WBC 6.4 11/20/2018   HGB 13.1 11/20/2018   HCT 38.3 11/20/2018   MCV 94.1 11/20/2018   PLT 161 11/20/2018   Lab Results  Component Value Date   FERRITIN 181 07/24/2018   IRON 69 07/24/2018   TIBC 286 07/24/2018   UIBC 217 07/24/2018   IRONPCTSAT 24 07/24/2018   Lab Results  Component Value Date   RETICCTPCT 3.1 07/24/2018   RBC 4.07 11/20/2018   No results found for: KPAFRELGTCHN, LAMBDASER, KAPLAMBRATIO No results found for: IGGSERUM, IGA, IGMSERUM No results found for: Odetta Pink, SPEI   Chemistry      Component Value Date/Time   NA 137 09/14/2018 0923   NA 140 07/21/2017 1120   NA 140 03/15/2016 0953   K 3.3 (L) 09/14/2018 0923   K 3.9 03/15/2016 0953   CL 98 09/14/2018 0923   CL 102 12/13/2015 0803   CO2 29 09/14/2018 0923   CO2 19 (L) 03/15/2016 0953   BUN 12 09/14/2018 0923   BUN 13 07/21/2017 1120   BUN 15.9 03/15/2016 0953  CREATININE 1.07 (H) 09/14/2018 0923   CREATININE 1.4 (H) 03/15/2016 0953      Component Value Date/Time   CALCIUM 8.9 09/14/2018 0923   CALCIUM 9.3 03/15/2016 0953   ALKPHOS 45 09/14/2018 0923   ALKPHOS 55 03/15/2016 0953   AST 17 09/14/2018 0923   AST 17 03/15/2016 0953   ALT 12 09/14/2018 0923   ALT 23 03/15/2016 0953   BILITOT 0.3 09/14/2018 0923   BILITOT 0.38 03/15/2016 0953       Impression and Plan: [name] is a very pleasant 58 yo caucasian female with recurrent granulosa cell tumor.  I think everything will hinge on the inhibin B level.  Hopefully, we will see that it will stay low and stable for a while.  I will get her back in 3 months.  I think this is reasonable.  She will always let us know if she has any problems.  I am just happy that her quality of life is doing so well right now.  Volanda Napoleon, MD 5/29/202010:59 AM

## 2018-11-20 NOTE — Telephone Encounter (Signed)
sw pt to confirm 7/10 appt at 11 am and 8/21 appt at 1145 am per 5/29 los

## 2018-11-20 NOTE — Telephone Encounter (Signed)
Patient notified per order of Dr. Marin Olp that INR is 2.5.  Patient appreciative of call and has no questions at this time.

## 2018-11-21 ENCOUNTER — Other Ambulatory Visit: Payer: Self-pay | Admitting: Hematology & Oncology

## 2018-11-22 ENCOUNTER — Other Ambulatory Visit: Payer: Self-pay | Admitting: Hematology & Oncology

## 2018-11-23 ENCOUNTER — Other Ambulatory Visit: Payer: Self-pay | Admitting: *Deleted

## 2018-11-24 LAB — INHIBIN B: Inhibin B: <7 pg/mL (ref 0.0–16.9)

## 2018-11-25 ENCOUNTER — Telehealth: Payer: Self-pay | Admitting: *Deleted

## 2018-11-25 NOTE — Telephone Encounter (Signed)
-----   Message from Volanda Napoleon, MD sent at 11/25/2018  6:45 AM EDT ----- Call - the Inhibin B is now < 7!!!   [place] job!!!!  pete

## 2018-11-25 NOTE — Telephone Encounter (Signed)
Notified pt of lab results. Pt verbalized understanding. 

## 2018-12-07 ENCOUNTER — Other Ambulatory Visit: Payer: Self-pay | Admitting: Hematology & Oncology

## 2018-12-10 ENCOUNTER — Ambulatory Visit (INDEPENDENT_AMBULATORY_CARE_PROVIDER_SITE_OTHER): Payer: BC Managed Care – PPO | Admitting: *Deleted

## 2018-12-10 DIAGNOSIS — I5022 Chronic systolic (congestive) heart failure: Secondary | ICD-10-CM

## 2018-12-10 DIAGNOSIS — I472 Ventricular tachycardia: Secondary | ICD-10-CM | POA: Diagnosis not present

## 2018-12-10 DIAGNOSIS — I4729 Other ventricular tachycardia: Secondary | ICD-10-CM

## 2018-12-10 LAB — CUP PACEART REMOTE DEVICE CHECK
Battery Remaining Longevity: 15 mo
Battery Remaining Percentage: 16 %
Battery Voltage: 2.74 V
Brady Statistic AP VP Percent: 1 %
Brady Statistic AP VS Percent: 1 %
Brady Statistic AS VP Percent: 1.9 %
Brady Statistic AS VS Percent: 97 %
Brady Statistic RA Percent Paced: 1 %
Brady Statistic RV Percent Paced: 1.9 %
Date Time Interrogation Session: 20200618094952
HighPow Impedance: 65 Ohm
HighPow Impedance: 65 Ohm
Implantable Lead Implant Date: 20110503
Implantable Lead Implant Date: 20130815
Implantable Lead Location: 753859
Implantable Lead Location: 753860
Implantable Pulse Generator Implant Date: 20110503
Lead Channel Impedance Value: 240 Ohm
Lead Channel Impedance Value: 390 Ohm
Lead Channel Sensing Intrinsic Amplitude: 11.4 mV
Lead Channel Sensing Intrinsic Amplitude: 2.1 mV
Lead Channel Setting Pacing Amplitude: 2 V
Lead Channel Setting Pacing Amplitude: 3 V
Lead Channel Setting Pacing Pulse Width: 1.3 ms
Lead Channel Setting Sensing Sensitivity: 0.5 mV
Pulse Gen Serial Number: 778164

## 2018-12-14 NOTE — Progress Notes (Signed)
Remote ICD transmission.   

## 2018-12-18 ENCOUNTER — Other Ambulatory Visit: Payer: Self-pay | Admitting: Hematology & Oncology

## 2018-12-18 DIAGNOSIS — D3910 Neoplasm of uncertain behavior of unspecified ovary: Secondary | ICD-10-CM

## 2018-12-21 ENCOUNTER — Ambulatory Visit: Payer: BLUE CROSS/BLUE SHIELD | Admitting: Hematology & Oncology

## 2018-12-21 ENCOUNTER — Other Ambulatory Visit: Payer: BLUE CROSS/BLUE SHIELD

## 2019-01-05 ENCOUNTER — Other Ambulatory Visit: Payer: Self-pay

## 2019-01-06 ENCOUNTER — Inpatient Hospital Stay: Payer: BC Managed Care – PPO | Attending: Hematology & Oncology

## 2019-01-06 ENCOUNTER — Other Ambulatory Visit: Payer: Self-pay

## 2019-01-06 DIAGNOSIS — Z452 Encounter for adjustment and management of vascular access device: Secondary | ICD-10-CM | POA: Diagnosis not present

## 2019-01-06 DIAGNOSIS — D3910 Neoplasm of uncertain behavior of unspecified ovary: Secondary | ICD-10-CM | POA: Diagnosis present

## 2019-01-06 IMAGING — CT CT CHEST W/ CM
2 of 5 series · 11 of 36 positions shown, 13 images · IV contrast (APPLIED)
Comparison: CTA chest/abdomen/pelvis 08/26/2017.

CLINICAL DATA: Granulosa cell tumor of the ovary. Elevated tumor
markers.

EXAM:
CT CHEST, ABDOMEN, AND PELVIS WITH CONTRAST
TECHNIQUE: Multidetector CT imaging of the chest, abdomen and pelvis was
performed following the standard protocol during bolus
administration of intravenous contrast.
CONTRAST:  80mL RKBZE7-W00 IOPAMIDOL (RKBZE7-W00) INJECTION 61%

[Series 2: cap with 2 · axial · 0.95mm/px · z∈[-661,-86]mm · 8 of 141 slices shown, 10 images]
[im 13/141  mediastinal]
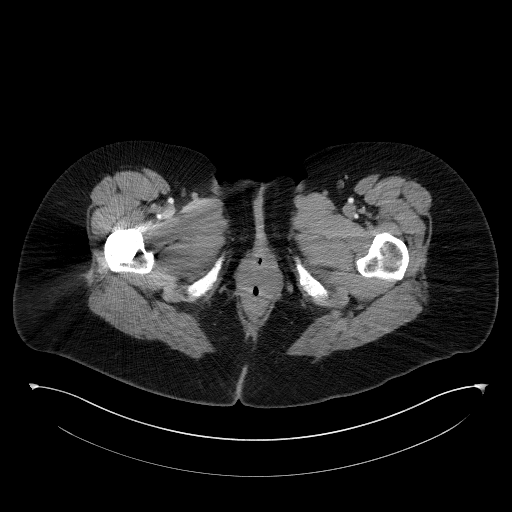
[im 13/141  lung]
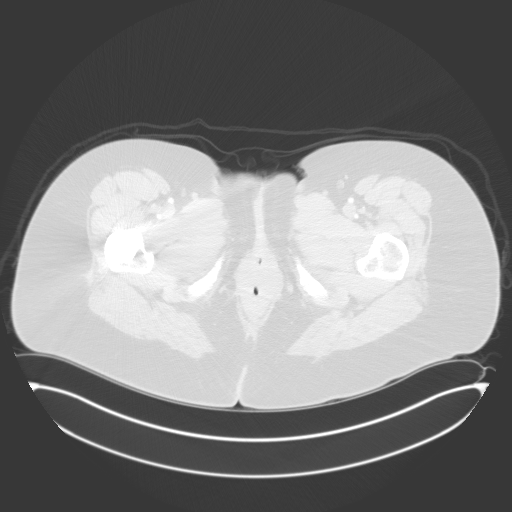
[im 26/141  lung]
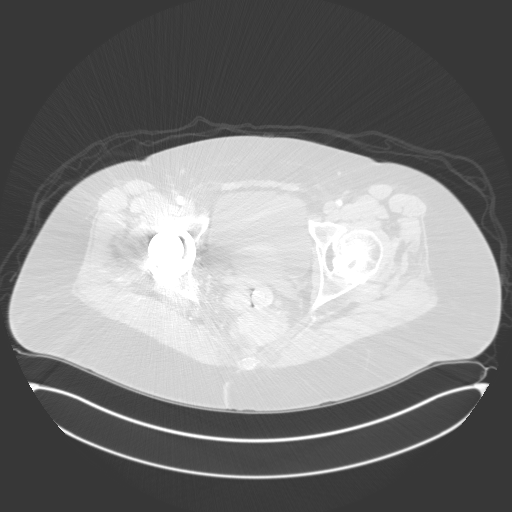
[im 51/141  lung]
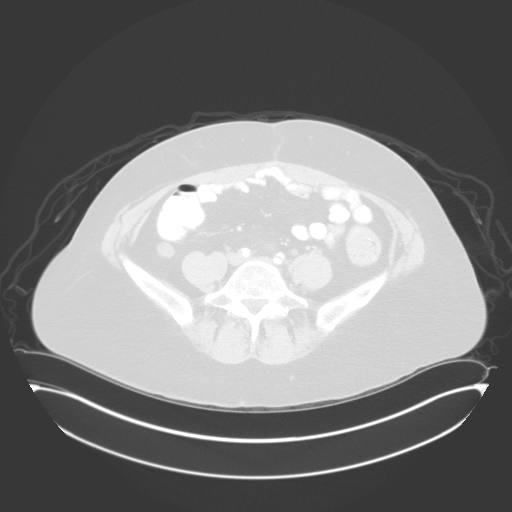
[im 64/141  lung]
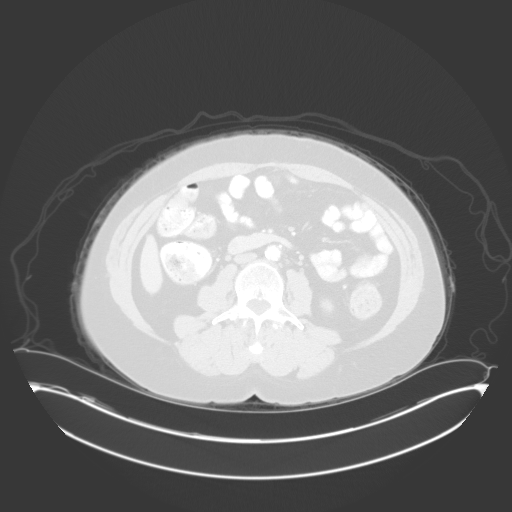
[im 77/141  mediastinal]
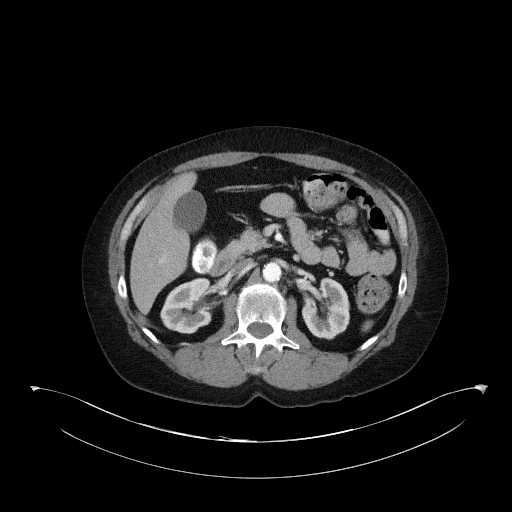
[im 77/141  lung]
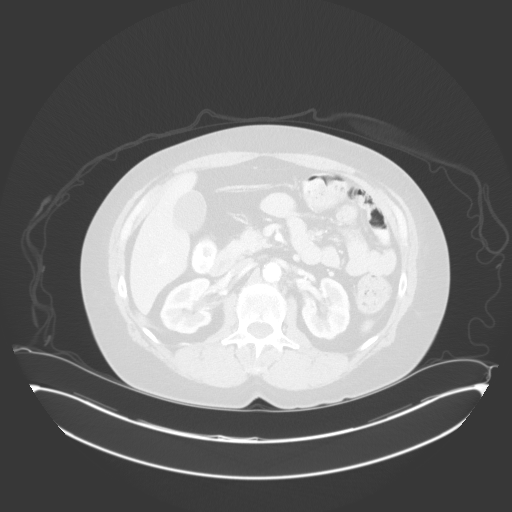
[im 90/141  lung]
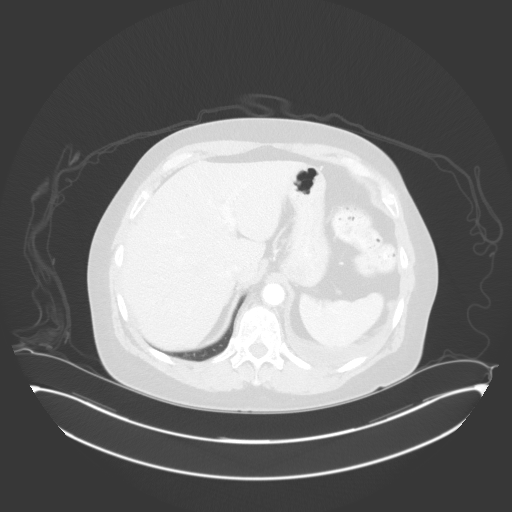
[im 115/141  lung]
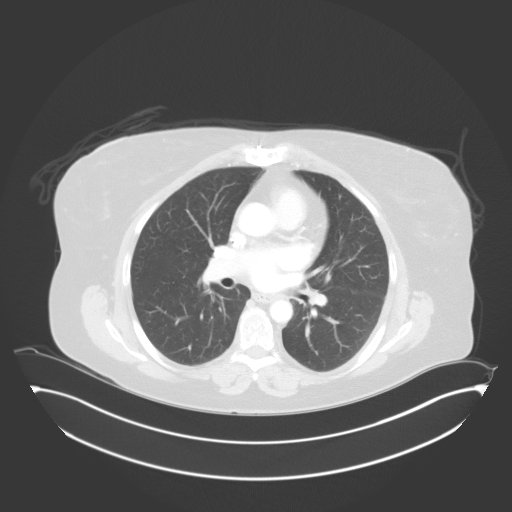
[im 128/141  lung]
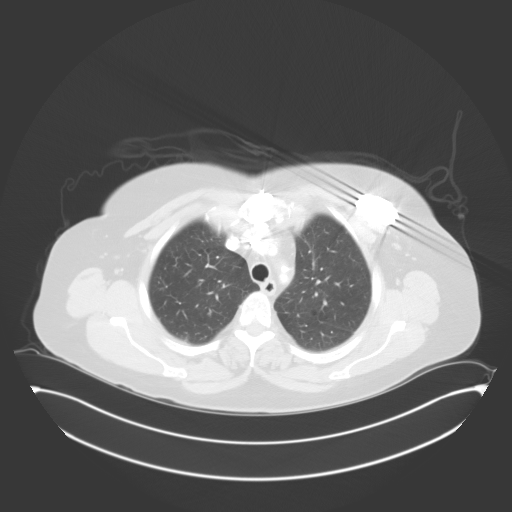

[Series 4: coronals · coronal · 0.93mm/px · 3 of 146 slices shown]
[im 30/146  lung]
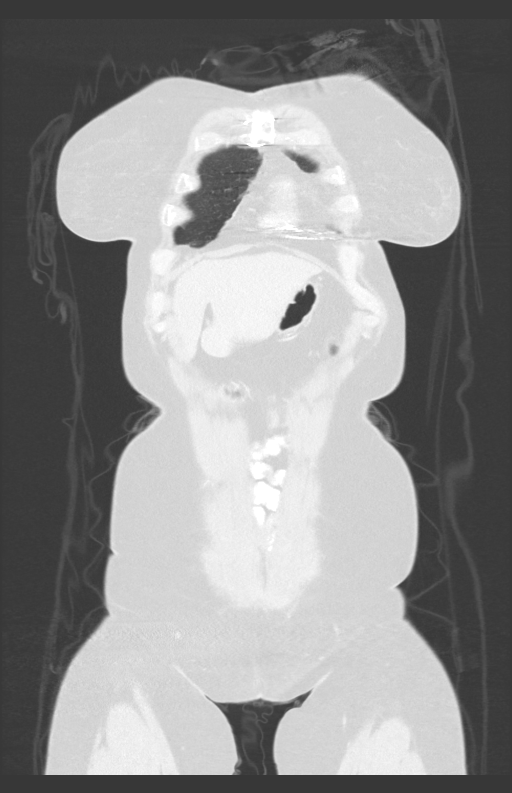
[im 59/146  lung]
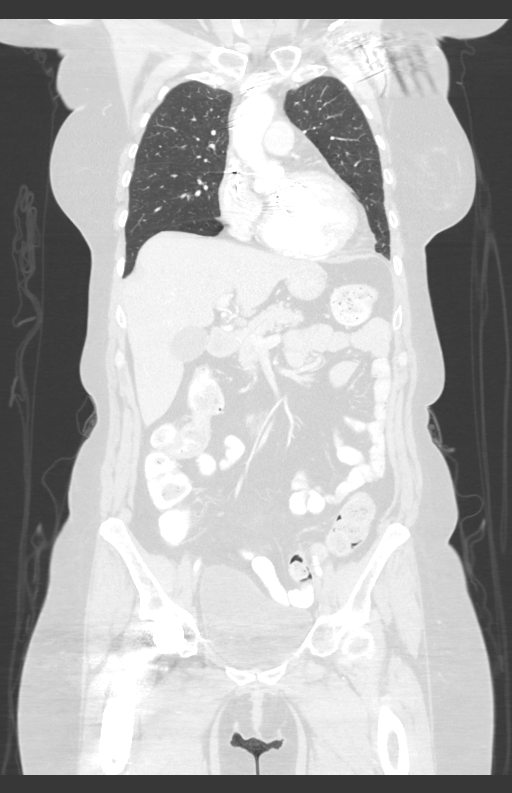
[im 88/146  lung]
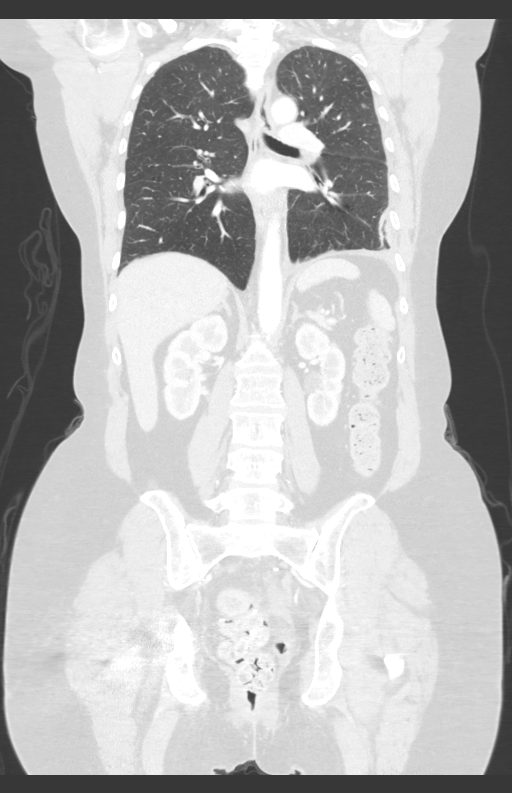

[11 of 36 positions shown; findings below may reference images not displayed]

FINDINGS: CT CHEST FINDINGS

Cardiovascular: The heart size is normal. No substantial pericardial
effusion. Atherosclerotic calcification is noted in the wall of the
thoracic aorta. Status post mitral valve replacement. Left-sided
permanent pacemaker/AICD evident.

Mediastinum/Nodes: No mediastinal lymphadenopathy. 15 mm short axis
paratracheal lymph node measured on the prior study has decreased in
the interval now measuring 7 mm. There is no hilar lymphadenopathy.
The esophagus has normal imaging features. There is no axillary
lymphadenopathy.

Lungs/Pleura: The central tracheobronchial airways are patent.
Centrilobular emphysema noted. 4 mm right perifissural nodule (71/3)
is stable. 10 mm ground-glass nodule identified posterior right
upper lobe on prior study is less confluent today and measures 7 mm.
4 mm ground-glass nodule identified in the left apex with additional
peripheral ground-glass nodules. 6 mm left upper lobe nodule (42/3)
is similar to prior. Interval development of subpleural plaque-like
airspace opacity associated with volume loss. Evolving tiny pleural
effusion versus pleural thickening evident.

Musculoskeletal: No worrisome lytic or sclerotic osseous
abnormality. Degenerative changes left shoulder with intra-articular
loose bodies evident. Sclerotic lesion in the right T5 transverse
process is stable.

CT ABDOMEN PELVIS FINDINGS

Hepatobiliary: No focal abnormality within the liver parenchyma.
Possible tiny dependent gallstones (62/2). No intrahepatic or
extrahepatic biliary dilation.

Pancreas: No focal mass lesion. No dilatation of the main duct. No
intraparenchymal cyst. No peripancreatic edema.

Spleen: No splenomegaly. No focal mass lesion.

Adrenals/Urinary Tract: No adrenal nodule or mass. Kidneys
unremarkable No evidence for hydroureter. The urinary bladder
appears normal for the degree of distention.

Stomach/Bowel: Tiny hiatal hernia. Stomach is nondistended. No
gastric wall thickening. No evidence of outlet obstruction. Duodenum
is normally positioned as is the ligament of Treitz. No small bowel
wall thickening. No small bowel dilatation. The terminal ileum is
normal. The appendix is not visualized, but there is no edema or
inflammation in the region of the cecum. No gross colonic mass. No
colonic wall thickening. No substantial diverticular change.
Anastomotic staple line noted in the rectosigmoid region.

Vascular/Lymphatic: There is abdominal aortic atherosclerosis
without aneurysm. There is no gastrohepatic or hepatoduodenal
ligament lymphadenopathy. No intraperitoneal or retroperitoneal
lymphadenopathy. No pelvic sidewall lymphadenopathy.

Reproductive: Uterus surgically absent.

Other: The multiple soft tissue masses in the central pelvis are
again identified. Right paracolic gutter lesion measures 1.4 x
cm today compared to 2.1 x 2.9 cm previously. 4.5 x 5.4 cm mass just
posterior to the bladder (113/2) has increased from 4.5 x 4.9 cm
when I remeasure in a similar fashion on the prior study. The lesion
in the right adnexal space appears more cystic and has decreased in
the interval measuring 3.4 x 3.7 cm (108/2) compared to 3.9 x 5.5 cm
previously. Cystic lesion in the presacral space has clearly
progressed and a heterogeneously enhancing lesion posterior to the
rectum and just anterior to the coccyx measures 5.2 x 3.7 cm today
compared to 2.9 x 2.1 cm when I remeasure on the prior study.
Enhancing nodules in the right presacral space (110/2) are new in
the interval presacral lesion just distal to the aortic bifurcation
measures 2.4 x 5.1 cm today.

Musculoskeletal: Status post right hip replacement. No worrisome
lytic or sclerotic osseous abnormality.
IMPRESSION: 1. Granulosa cell tumor, by report on observation without current
therapy. Somewhat confusing findings on today's exam given this
history. The right paracolic gutter mass and a right adnexal mass
have clearly decreased in size in the interval. Other masses appear
relatively similar, but presacral disease and retroperitoneal
disease at the aortic bifurcation have clearly progressed. Disease
today represents a combination of mainly cystic, mainly solid, and
combined appearance.
2. Interval development of subpleural plaque-like airspace opacity
with volume loss in the left lower lobe, likely evolving
scar/rounded atelectasis. Tiny left pleural effusion/thickening
associated. Attention on follow-up recommended.
3. Bilateral pulmonary nodules, largely stable since prior exam. 4
mm ground-glass nodule in the left lung apex is new in the interval.
Attention on follow-up exam suggested.
4.  Aortic Atherosclerois (XPWNZ-170.0)
5.  Emphysema. (XPWNZ-8WN.8)

## 2019-01-06 NOTE — Patient Instructions (Signed)

## 2019-01-07 ENCOUNTER — Other Ambulatory Visit: Payer: Self-pay | Admitting: Hematology & Oncology

## 2019-01-07 DIAGNOSIS — D3910 Neoplasm of uncertain behavior of unspecified ovary: Secondary | ICD-10-CM

## 2019-02-01 ENCOUNTER — Other Ambulatory Visit: Payer: Self-pay | Admitting: Hematology & Oncology

## 2019-02-01 DIAGNOSIS — D3910 Neoplasm of uncertain behavior of unspecified ovary: Secondary | ICD-10-CM

## 2019-02-12 ENCOUNTER — Encounter: Payer: Self-pay | Admitting: Hematology & Oncology

## 2019-02-12 ENCOUNTER — Inpatient Hospital Stay: Payer: BC Managed Care – PPO

## 2019-02-12 ENCOUNTER — Other Ambulatory Visit: Payer: Self-pay

## 2019-02-12 ENCOUNTER — Inpatient Hospital Stay: Payer: BC Managed Care – PPO | Attending: Hematology & Oncology | Admitting: Hematology & Oncology

## 2019-02-12 ENCOUNTER — Telehealth: Payer: Self-pay | Admitting: Hematology & Oncology

## 2019-02-12 VITALS — BP 116/72 | HR 86 | Temp 97.1°F | Resp 18 | Ht 66.0 in | Wt 183.1 lb

## 2019-02-12 DIAGNOSIS — R294 Clicking hip: Secondary | ICD-10-CM | POA: Insufficient documentation

## 2019-02-12 DIAGNOSIS — D3910 Neoplasm of uncertain behavior of unspecified ovary: Secondary | ICD-10-CM

## 2019-02-12 DIAGNOSIS — I5022 Chronic systolic (congestive) heart failure: Secondary | ICD-10-CM | POA: Diagnosis not present

## 2019-02-12 DIAGNOSIS — Z7901 Long term (current) use of anticoagulants: Secondary | ICD-10-CM | POA: Insufficient documentation

## 2019-02-12 DIAGNOSIS — Z953 Presence of xenogenic heart valve: Secondary | ICD-10-CM

## 2019-02-12 DIAGNOSIS — M25552 Pain in left hip: Secondary | ICD-10-CM | POA: Diagnosis not present

## 2019-02-12 DIAGNOSIS — Z95828 Presence of other vascular implants and grafts: Secondary | ICD-10-CM

## 2019-02-12 DIAGNOSIS — Z79899 Other long term (current) drug therapy: Secondary | ICD-10-CM | POA: Diagnosis not present

## 2019-02-12 DIAGNOSIS — Z7982 Long term (current) use of aspirin: Secondary | ICD-10-CM | POA: Diagnosis not present

## 2019-02-12 LAB — CBC WITH DIFFERENTIAL (CANCER CENTER ONLY)
Abs Immature Granulocytes: 0.03 10*3/uL (ref 0.00–0.07)
Basophils Absolute: 0 10*3/uL (ref 0.0–0.1)
Basophils Relative: 1 %
Eosinophils Absolute: 0.2 10*3/uL (ref 0.0–0.5)
Eosinophils Relative: 2 %
HCT: 38.6 % (ref 36.0–46.0)
Hemoglobin: 13.2 g/dL (ref 12.0–15.0)
Immature Granulocytes: 0 %
Lymphocytes Relative: 11 %
Lymphs Abs: 0.8 10*3/uL (ref 0.7–4.0)
MCH: 32.8 pg (ref 26.0–34.0)
MCHC: 34.2 g/dL (ref 30.0–36.0)
MCV: 96 fL (ref 80.0–100.0)
Monocytes Absolute: 0.8 10*3/uL (ref 0.1–1.0)
Monocytes Relative: 10 %
Neutro Abs: 5.7 10*3/uL (ref 1.7–7.7)
Neutrophils Relative %: 76 %
Platelet Count: 164 10*3/uL (ref 150–400)
RBC: 4.02 MIL/uL (ref 3.87–5.11)
RDW: 12.8 % (ref 11.5–15.5)
WBC Count: 7.5 10*3/uL (ref 4.0–10.5)
nRBC: 0 % (ref 0.0–0.2)

## 2019-02-12 LAB — CMP (CANCER CENTER ONLY)
ALT: 15 U/L (ref 0–44)
AST: 17 U/L (ref 15–41)
Albumin: 3.9 g/dL (ref 3.5–5.0)
Alkaline Phosphatase: 48 U/L (ref 38–126)
Anion gap: 12 (ref 5–15)
BUN: 21 mg/dL — ABNORMAL HIGH (ref 6–20)
CO2: 26 mmol/L (ref 22–32)
Calcium: 8.6 mg/dL — ABNORMAL LOW (ref 8.9–10.3)
Chloride: 101 mmol/L (ref 98–111)
Creatinine: 1.28 mg/dL — ABNORMAL HIGH (ref 0.44–1.00)
GFR, Est AFR Am: 53 mL/min — ABNORMAL LOW (ref >60)
GFR, Estimated: 46 mL/min — ABNORMAL LOW (ref >60)
Glucose, Bld: 90 mg/dL (ref 70–99)
Potassium: 3.4 mmol/L — ABNORMAL LOW (ref 3.5–5.1)
Sodium: 139 mmol/L (ref 135–145)
Total Bilirubin: 0.3 mg/dL (ref 0.3–1.2)
Total Protein: 6.4 g/dL — ABNORMAL LOW (ref 6.5–8.1)

## 2019-02-12 LAB — PROTIME-INR
INR: 3 — ABNORMAL HIGH (ref 0.8–1.2)
Prothrombin Time: 30.5 seconds — ABNORMAL HIGH (ref 11.4–15.2)

## 2019-02-12 MED ORDER — WARFARIN SODIUM 7.5 MG PO TABS: 7.5000 mg | ORAL_TABLET | Freq: Every day | ORAL | 0 refills | Status: DC

## 2019-02-12 MED ORDER — SODIUM CHLORIDE 0.9% FLUSH
10.0000 mL | INTRAVENOUS | Status: DC | PRN
  Administered 2019-02-12: 12:00:00 10 mL via INTRAVENOUS
  Filled 2019-02-12: qty 10

## 2019-02-12 MED ORDER — HEPARIN SOD (PORK) LOCK FLUSH 100 UNIT/ML IV SOLN
500.0000 [IU] | Freq: Once | INTRAVENOUS | Status: AC
  Administered 2019-02-12: 500 [IU] via INTRAVENOUS
  Filled 2019-02-12: qty 5

## 2019-02-12 NOTE — Patient Instructions (Signed)

## 2019-02-12 NOTE — Progress Notes (Signed)
Hematology and Oncology Follow Up Visit  ALYCEN GONYA [id number] [birth year] 58 y.o. 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 Reida is here today for follow-up.  She is doing pretty well.  She has really had very few problems to date.  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  Hopefully, this will improve.  Her last inhibin B level was 7.  This is normal.  As long as it is normal, we will not have to put her through any scans.  She is still doing Coumadin.  This is for cardiac reasons.  Her INR is 3.0.  She has had no cough.  She is had a little bit of hip pain in the left hip.  She said that her hip "clicks" every now and then.  There is been no leg swelling.  She has had no fever.  There is been no headache.  Has had no abdominal pain.  She has had no obvious change in bowel or bladder habits.    Overall, her performance status is ECOG 1.  Medications:  Allergies as of 02/12/2019      Reactions   Prochlorperazine Edisylate Other (See Comments)   Nervous/ flutter/ shakes   Adhesive [tape] Hives   Redness/hives from adhesive tape( tolerates latex gloves); tolerates paper tape      Medication List       Accurate as of February 12, 2019  1:35 PM. If you have any questions, ask your nurse or doctor.        albuterol 108 (90 Base) MCG/ACT inhaler Commonly known as: VENTOLIN HFA Inhale 2 puffs into the lungs every 6 (six) hours as needed for wheezing or shortness of breath.   amoxicillin 500 MG tablet Commonly known as: AMOXIL Take 2,000 mg by mouth. Take 2,000 mg one hour prior to dental visits.   aspirin EC 81 MG tablet Take 81 mg by mouth daily.   carvedilol 3.125 MG tablet Commonly known as: COREG TAKE 1 TABLET (3.125 MG TOTAL) BY MOUTH 2 (TWO) TIMES DAILY WITH A MEAL.   lidocaine-prilocaine  cream Commonly known as: EMLA Apply 1 application topically as needed. Apply to port per instructions   LORazepam 0.5 MG tablet Commonly known as: ATIVAN TAKE 1 TABLET BY MOUTH EVERY 6 HOURS AS NEEDED FOR ANXIETY   methocarbamol 500 MG tablet Commonly known as: ROBAXIN TAKE 1 TABLET (500 MG TOTAL) BY MOUTH EVERY 6 (SIX) HOURS AS NEEDED FOR MUSCLE SPASMS.   traMADol 50 MG tablet Commonly known as: ULTRAM TAKE 1 TABLET (50 MG TOTAL) BY MOUTH EVERY 6 (SIX) HOURS AS NEEDED.   Vitamin D (Ergocalciferol) 1.25 MG (50000 UT) Caps capsule Commonly known as: DRISDOL TAKE 1 CAPSULE (50,000 UNITS TOTAL) BY MOUTH EVERY 7 (SEVEN) DAYS.   warfarin 7.5 MG tablet Commonly known as: COUMADIN Take 1 tablet (7.5 mg total) by mouth daily at 6 PM.   zolpidem 5 MG tablet Commonly known as: AMBIEN TAKE 1 TABLET BY MOUTH EVERYDAY AT BEDTIME       Allergies:  Allergies  Allergen Reactions  . Prochlorperazine Edisylate Other (See Comments)    Nervous/ flutter/ shakes  . Adhesive [Tape] Hives    Redness/hives from adhesive tape( tolerates latex gloves); tolerates paper tape    Past Medical History, Surgical history, Social history, and Family History were reviewed and updated.  Review of  Systems:  Review of Systems  Constitutional: Negative.   HENT: Negative.   Eyes: Negative.   Respiratory: Negative.   Cardiovascular: Negative.   Gastrointestinal: Negative.   Genitourinary: Negative.   Musculoskeletal: Negative.   Skin: Negative.   Neurological: Negative.   Endo/Heme/Allergies: Negative.   Psychiatric/Behavioral: Negative.       Exam:  height is 5\' 6"  (1.676 m) and weight is 183 lb 1.9 oz (83.1 kg). Her temporal temperature is 97.1 F (36.2 C) (abnormal). Her blood pressure is 116/72 and her pulse is 86. Her respiration is 18 and oxygen saturation is 100%.   Wt Readings from Last 3 Encounters:  02/12/19 183 lb 1.9 oz (83.1 kg)  09/14/18 185 lb (83.9 kg)  08/27/18 180 lb 1.9 oz  (81.7 kg)    Physical Exam Vitals signs reviewed.  HENT:     Head: Normocephalic and atraumatic.  Eyes:     Pupils: Pupils are equal, round, and reactive to light.  Neck:     Musculoskeletal: Normal range of motion.  Cardiovascular:     Rate and Rhythm: Normal rate and regular rhythm.     Heart sounds: Normal heart sounds.  Pulmonary:     Effort: Pulmonary effort is normal.     Breath sounds: Normal breath sounds.  Abdominal:     General: Bowel sounds are normal.     Palpations: Abdomen is soft.  Musculoskeletal: Normal range of motion.        General: No tenderness or deformity.  Lymphadenopathy:     Cervical: No cervical adenopathy.  Skin:    General: Skin is warm and dry.     Findings: No erythema or rash.  Neurological:     Mental Status: She is alert and oriented to person, place, and time.  Psychiatric:        Behavior: Behavior normal.        Thought Content: Thought content normal.        Judgment: Judgment normal.      Lab Results  Component Value Date   WBC 7.5 02/12/2019   HGB 13.2 02/12/2019   HCT 38.6 02/12/2019   MCV 96.0 02/12/2019   PLT 164 02/12/2019   Lab Results  Component Value Date   FERRITIN 181 07/24/2018   IRON 69 07/24/2018   TIBC 286 07/24/2018   UIBC 217 07/24/2018   IRONPCTSAT 24 07/24/2018   Lab Results  Component Value Date   RETICCTPCT 3.1 07/24/2018   RBC 4.02 02/12/2019   No results found for: KPAFRELGTCHN, LAMBDASER, KAPLAMBRATIO No results found for: IGGSERUM, IGA, IGMSERUM No results found for: Odetta Pink, SPEI   Chemistry      Component Value Date/Time   NA 139 02/12/2019 1128   NA 140 07/21/2017 1120   NA 140 03/15/2016 0953   K 3.4 (L) 02/12/2019 1128   K 3.9 03/15/2016 0953   CL 101 02/12/2019 1128   CL 102 12/13/2015 0803   CO2 26 02/12/2019 1128   CO2 19 (L) 03/15/2016 0953   BUN 21 (H) 02/12/2019 1128   BUN 13 07/21/2017 1120   BUN 15.9  03/15/2016 0953   CREATININE 1.28 (H) 02/12/2019 1128   CREATININE 1.4 (H) 03/15/2016 0953      Component Value Date/Time   CALCIUM 8.6 (L) 02/12/2019 1128   CALCIUM 9.3 03/15/2016 0953   ALKPHOS 48 02/12/2019 1128   ALKPHOS 55 03/15/2016 0953   AST 17 02/12/2019 1128   AST 17  03/15/2016 0953   ALT 15 02/12/2019 1128   ALT 23 03/15/2016 0953   BILITOT 0.3 02/12/2019 1128   BILITOT 0.38 03/15/2016 0953       Impression and Plan: Ms. Wehmeyer is a very pleasant 58 yo caucasian female with recurrent granulosa cell tumor.  I think everything will hinge on the inhibin B level.  Hopefully, we will see that it will stay low and stable for a while.  I will get her back in 3 months.  I think this is reasonable.  She will always let us know if she has any problems.  I am just happy that her quality of life is doing so well right now.  Volanda Napoleon, MD 8/21/20201:35 PM

## 2019-02-12 NOTE — Telephone Encounter (Signed)
lmom to inform patient of Oct and Nov appts per 8/21 LOS

## 2019-02-16 LAB — INHIBIN B: Inhibin B: 8.4 pg/mL (ref 0.0–16.9)

## 2019-02-17 ENCOUNTER — Encounter: Payer: Self-pay | Admitting: *Deleted

## 2019-02-18 ENCOUNTER — Other Ambulatory Visit: Payer: Self-pay | Admitting: Hematology & Oncology

## 2019-02-24 ENCOUNTER — Other Ambulatory Visit: Payer: Self-pay | Admitting: Hematology & Oncology

## 2019-02-24 DIAGNOSIS — D3910 Neoplasm of uncertain behavior of unspecified ovary: Secondary | ICD-10-CM

## 2019-02-25 ENCOUNTER — Other Ambulatory Visit: Payer: Self-pay | Admitting: *Deleted

## 2019-02-25 DIAGNOSIS — D3910 Neoplasm of uncertain behavior of unspecified ovary: Secondary | ICD-10-CM

## 2019-02-25 MED ORDER — LORAZEPAM 0.5 MG PO TABS: 0.5000 mg | ORAL_TABLET | Freq: Four times a day (QID) | ORAL | 0 refills | Status: DC | PRN

## 2019-02-25 MED ORDER — TRAMADOL HCL 50 MG PO TABS: 50.0000 mg | ORAL_TABLET | Freq: Four times a day (QID) | ORAL | 0 refills | Status: DC | PRN

## 2019-02-25 MED ORDER — ALBUTEROL SULFATE HFA 108 (90 BASE) MCG/ACT IN AERS: 2.0000 | INHALATION_SPRAY | Freq: Four times a day (QID) | RESPIRATORY_TRACT | 1 refills | Status: DC | PRN

## 2019-03-09 IMAGING — CR DG CHEST 2V
2 series · 2 of 2 positions shown · non-contrast
Comparison: Chest x-ray 10/06/2017

CLINICAL DATA: Weakness and lightheadedness. History of ovarian
cancer.

EXAM:
CHEST - 2 VIEW

[w chest pa]
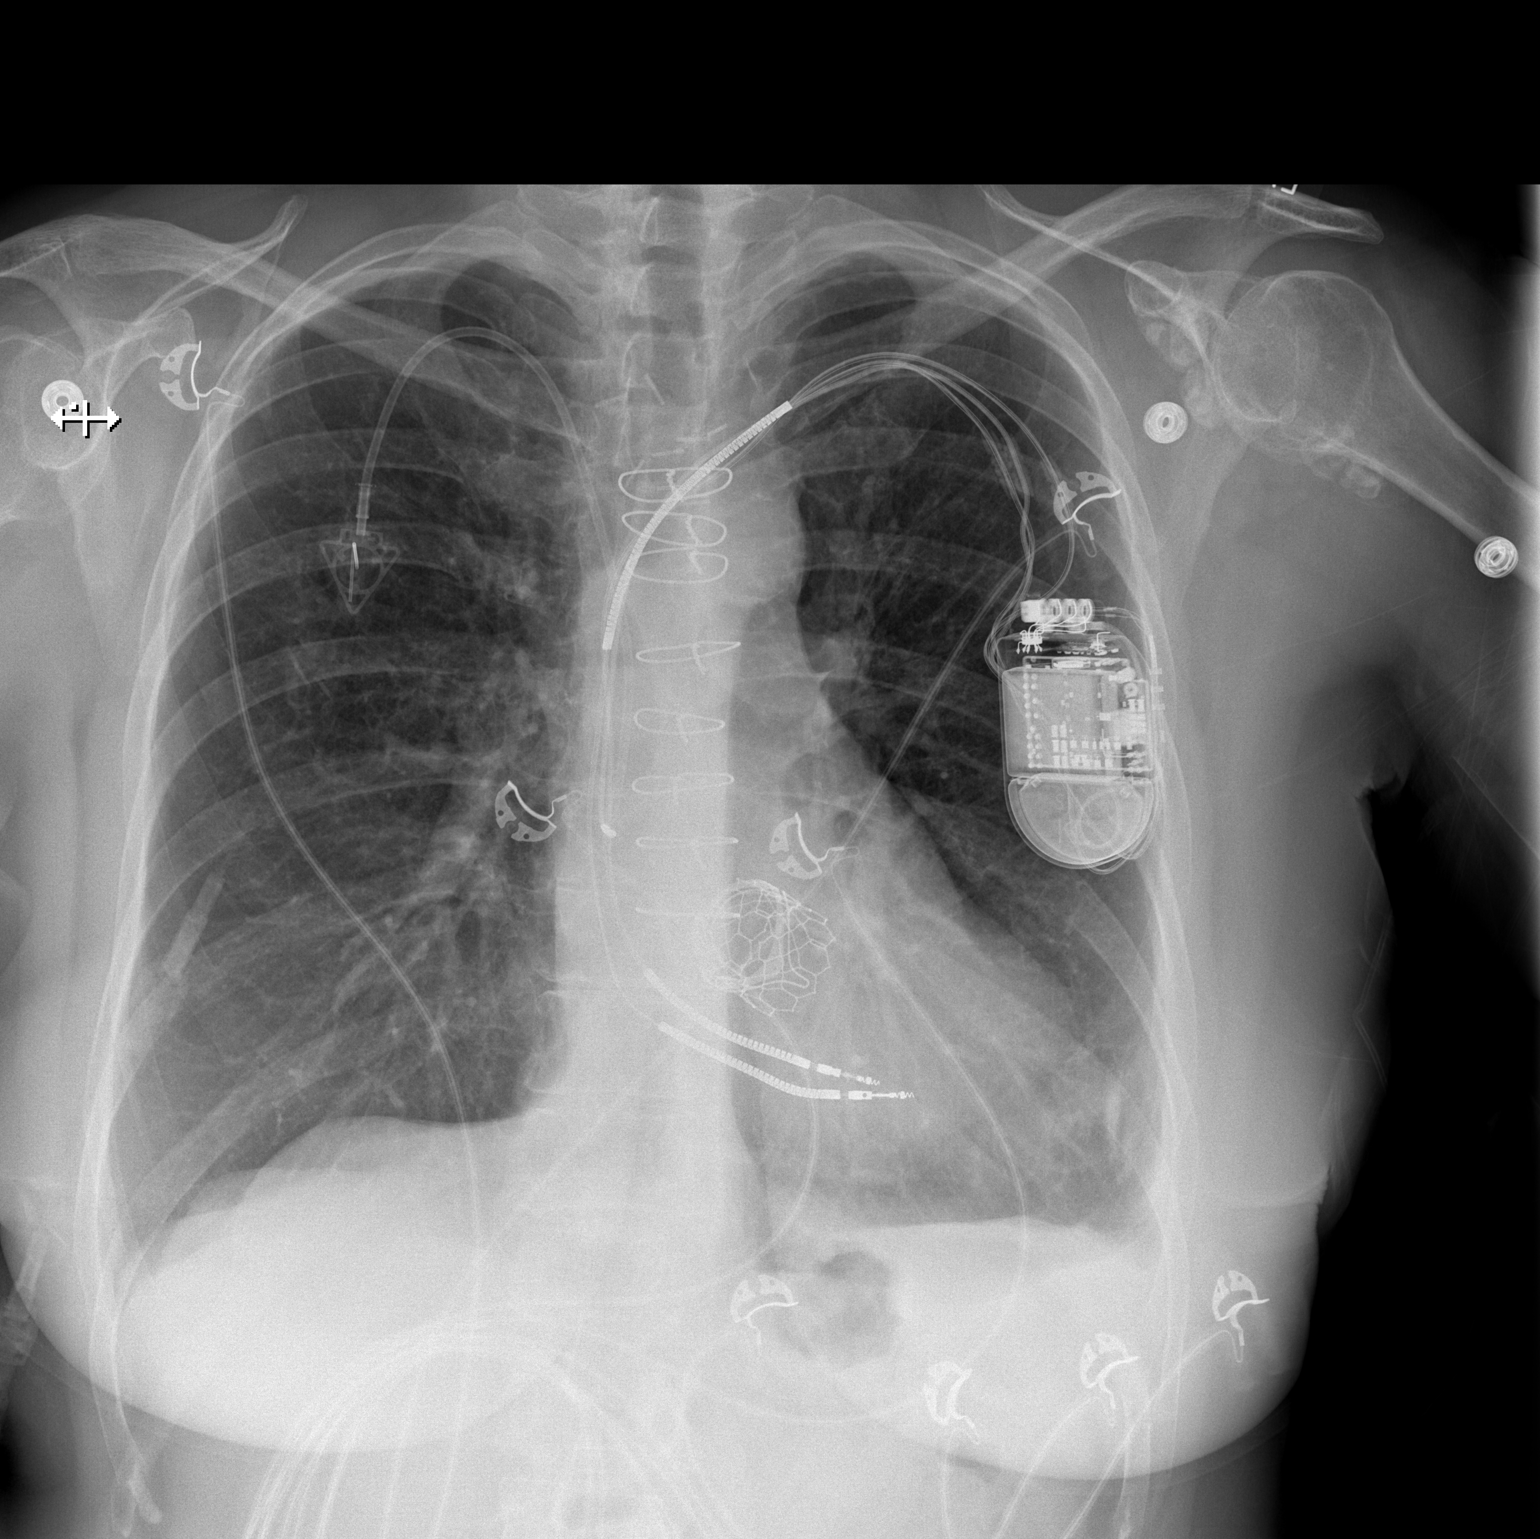

[w chest lat]
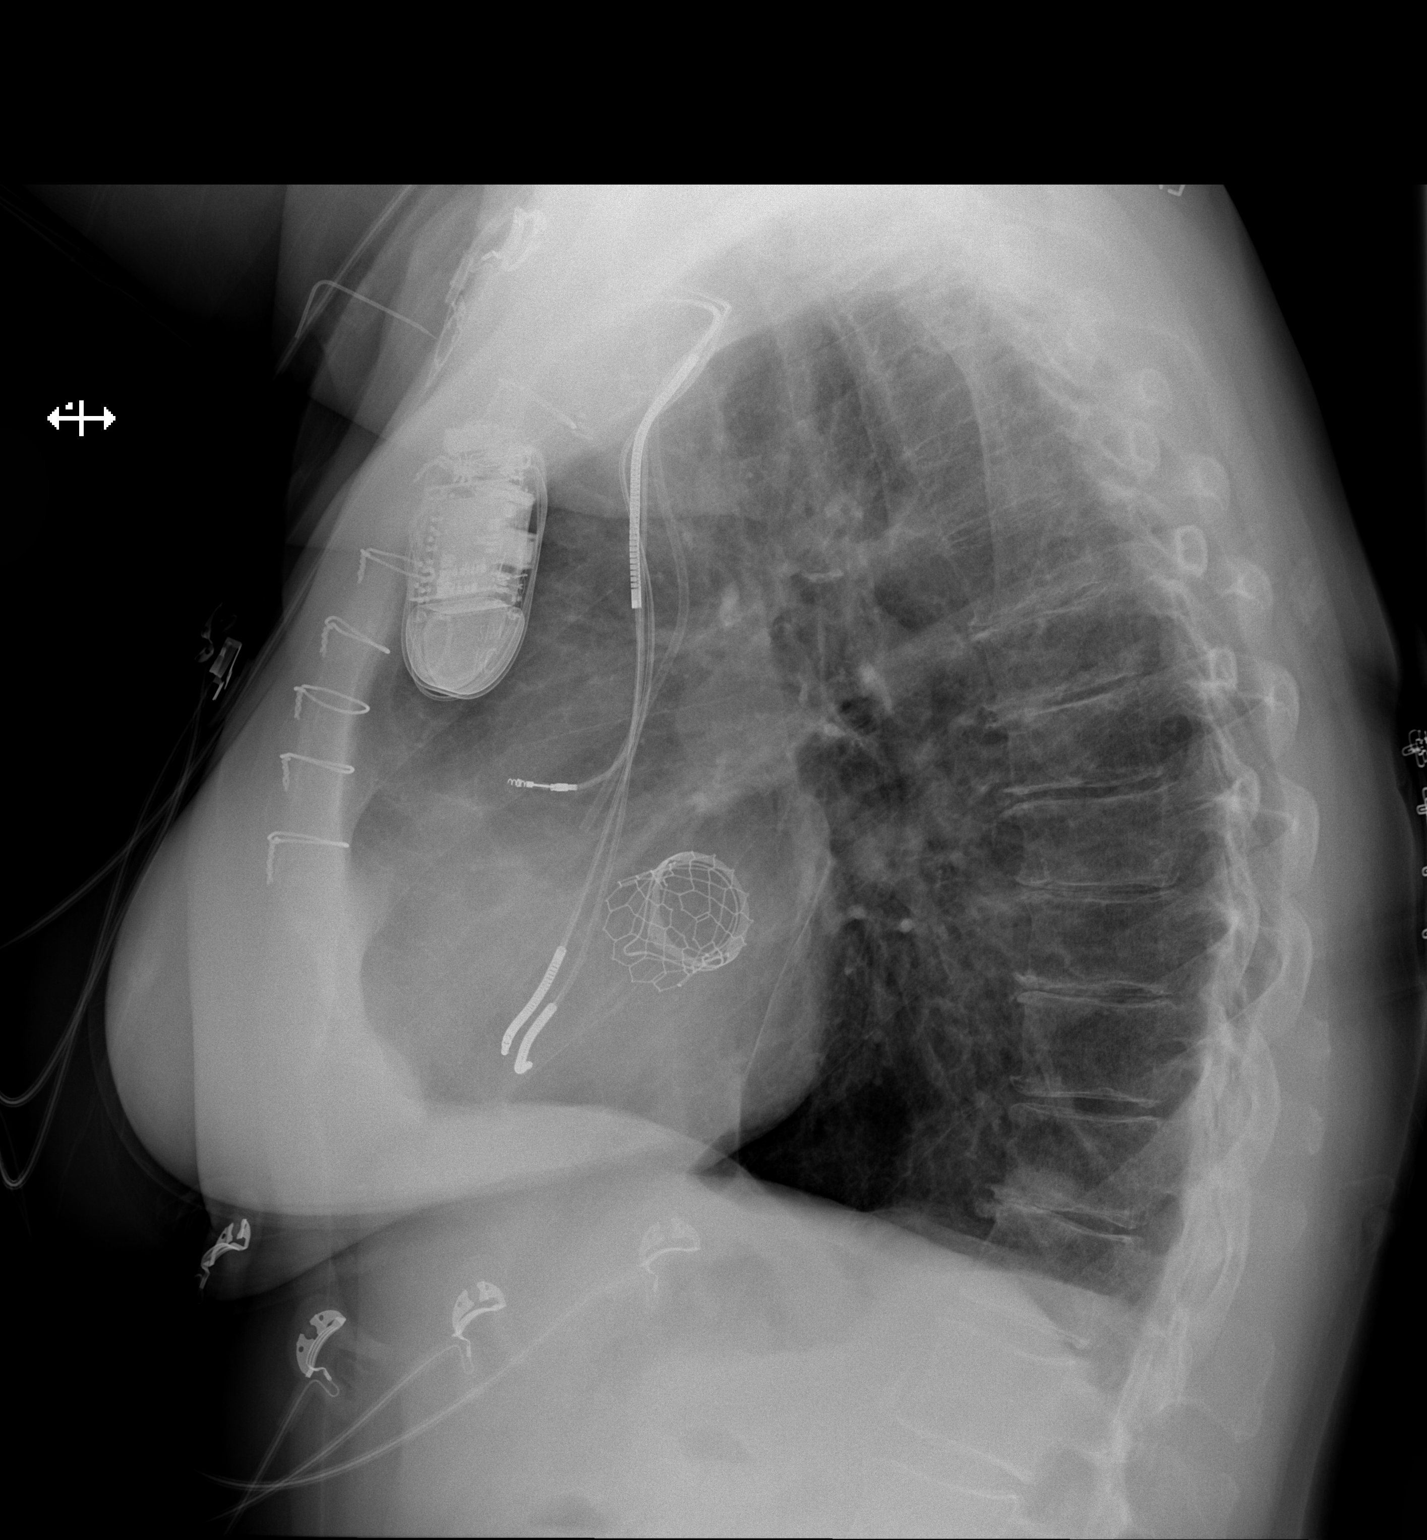

[2 of 2 positions shown; findings below may reference images not displayed]

FINDINGS: The pacer wires/AICD wires are stable. The heart is normal in size.
Mild tortuosity and calcification of the thoracic aorta. The
pulmonary hila appear normal. Right-sided IJ Port-A-Cath tip is in
good position. No complicating features. Evidence of prior
transcatheter mitral valve replacement.

The lungs are clear of an acute process. There are left basilar
scarring changes. Numerous calcifications around the left shoulder
likely ETOIL or synovial osteochondromatosis.
IMPRESSION: No acute cardiopulmonary findings.

## 2019-03-15 ENCOUNTER — Ambulatory Visit (INDEPENDENT_AMBULATORY_CARE_PROVIDER_SITE_OTHER): Payer: BC Managed Care – PPO | Admitting: *Deleted

## 2019-03-15 DIAGNOSIS — I4729 Other ventricular tachycardia: Secondary | ICD-10-CM

## 2019-03-15 DIAGNOSIS — I472 Ventricular tachycardia: Secondary | ICD-10-CM | POA: Diagnosis not present

## 2019-03-17 ENCOUNTER — Other Ambulatory Visit: Payer: Self-pay | Admitting: Hematology & Oncology

## 2019-03-18 LAB — CUP PACEART REMOTE DEVICE CHECK
Battery Remaining Longevity: 12 mo
Battery Remaining Percentage: 13 %
Battery Voltage: 2.71 V
Brady Statistic AP VP Percent: 1 %
Brady Statistic AP VS Percent: 1 %
Brady Statistic AS VP Percent: 1.5 %
Brady Statistic AS VS Percent: 98 %
Brady Statistic RA Percent Paced: 1 %
Brady Statistic RV Percent Paced: 1.5 %
Date Time Interrogation Session: 20200924070646
HighPow Impedance: 71 Ohm
HighPow Impedance: 71 Ohm
Implantable Lead Implant Date: 20110503
Implantable Lead Implant Date: 20130815
Implantable Lead Location: 753859
Implantable Lead Location: 753860
Implantable Pulse Generator Implant Date: 20110503
Lead Channel Impedance Value: 230 Ohm
Lead Channel Impedance Value: 380 Ohm
Lead Channel Pacing Threshold Amplitude: 0.75 V
Lead Channel Pacing Threshold Amplitude: 1.5 V
Lead Channel Pacing Threshold Pulse Width: 0.5 ms
Lead Channel Pacing Threshold Pulse Width: 1.3 ms
Lead Channel Sensing Intrinsic Amplitude: 11.4 mV
Lead Channel Sensing Intrinsic Amplitude: 2.6 mV
Lead Channel Setting Pacing Amplitude: 2 V
Lead Channel Setting Pacing Amplitude: 3 V
Lead Channel Setting Pacing Pulse Width: 1.3 ms
Lead Channel Setting Sensing Sensitivity: 0.5 mV
Pulse Gen Serial Number: 778164

## 2019-03-22 ENCOUNTER — Other Ambulatory Visit: Payer: Self-pay | Admitting: Family

## 2019-03-22 ENCOUNTER — Encounter: Payer: Self-pay | Admitting: Cardiology

## 2019-03-22 ENCOUNTER — Other Ambulatory Visit: Payer: Self-pay | Admitting: *Deleted

## 2019-03-22 DIAGNOSIS — D3910 Neoplasm of uncertain behavior of unspecified ovary: Secondary | ICD-10-CM

## 2019-03-22 MED ORDER — TRAMADOL HCL 50 MG PO TABS: 50.0000 mg | ORAL_TABLET | Freq: Four times a day (QID) | ORAL | 0 refills | Status: DC | PRN

## 2019-03-22 NOTE — Progress Notes (Signed)
Remote ICD transmission.   

## 2019-03-23 ENCOUNTER — Other Ambulatory Visit: Payer: Self-pay | Admitting: Family

## 2019-03-23 DIAGNOSIS — D3910 Neoplasm of uncertain behavior of unspecified ovary: Secondary | ICD-10-CM

## 2019-03-26 ENCOUNTER — Other Ambulatory Visit: Payer: Self-pay | Admitting: Family

## 2019-03-26 ENCOUNTER — Other Ambulatory Visit: Payer: Self-pay

## 2019-03-26 ENCOUNTER — Inpatient Hospital Stay: Payer: BC Managed Care – PPO | Attending: Hematology & Oncology

## 2019-03-26 DIAGNOSIS — Z452 Encounter for adjustment and management of vascular access device: Secondary | ICD-10-CM | POA: Diagnosis not present

## 2019-03-26 DIAGNOSIS — D3910 Neoplasm of uncertain behavior of unspecified ovary: Secondary | ICD-10-CM | POA: Diagnosis present

## 2019-03-26 DIAGNOSIS — Z95828 Presence of other vascular implants and grafts: Secondary | ICD-10-CM

## 2019-03-26 MED ORDER — TRAMADOL HCL 50 MG PO TABS: 50.0000 mg | ORAL_TABLET | Freq: Four times a day (QID) | ORAL | 0 refills | Status: DC | PRN

## 2019-03-26 MED ORDER — SODIUM CHLORIDE 0.9% FLUSH
10.0000 mL | Freq: Once | INTRAVENOUS | Status: AC
  Administered 2019-03-26: 10:00:00 10 mL via INTRAVENOUS
  Filled 2019-03-26: qty 10

## 2019-03-26 MED ORDER — HEPARIN SOD (PORK) LOCK FLUSH 100 UNIT/ML IV SOLN
500.0000 [IU] | Freq: Once | INTRAVENOUS | Status: AC
  Administered 2019-03-26: 10:00:00 500 [IU] via INTRAVENOUS
  Filled 2019-03-26: qty 5

## 2019-03-26 NOTE — Patient Instructions (Signed)
Tunneled Central Venous Catheter Flushing Guide  It is important to flush your tunneled central venous catheter each time you use it, both before and after you use it. Flushing your catheter will help prevent it from clogging. What are the risks? Risks may include:  Infection.  Air getting into the catheter and bloodstream. Supplies needed:  A clean pair of gloves.  A disinfecting wipe. Use an alcohol wipe, chlorhexidine wipe, or iodine wipe as told by your health care provider.  A 10 mL syringe that has been prefilled with saline solution.  An empty 10 mL syringe, if a substance called heparin was injected into your catheter. How to flush your catheter When you flush your catheter, make sure you follow any specific instructions from your health care provider or the manufacturer. These are general guidelines. Flushing your catheter before use If there is heparin in your catheter: 1. Wash your hands with soap and water. 2. Put on gloves. 3. Scrub the injection cap for a minimum of 15 seconds with a disinfecting wipe. 4. Unclamp the catheter. 5. Attach the empty syringe to the injection cap. 6. Pull the syringe plunger back and withdraw 10 mL of blood. 7. Place the syringe into an appropriate waste container. 8. Scrub the injection cap for 15 seconds with a disinfecting wipe. 9. Attach the prefilled syringe to the injection cap. 10. Flush the catheter by pushing the plunger forward until all the liquid from the syringe is in the catheter. 11. Remove the syringe from the injection cap. 12. Clamp the catheter. If there is no heparin in your catheter: 1. Wash your hands with soap and water. 2. Put on gloves. 3. Scrub the injection cap for 15 seconds with a disinfecting wipe. 4. Unclamp the catheter. 5. Attach the prefilled syringe to the injection cap. 6. Flush the catheter by pushing the plunger forward until 5 mL of the liquid from the syringe is in the catheter. 7. Pull back on  the syringe until you see blood in the catheter. 8. If you have been asked to collect any blood, follow your health care provider's instructions. Otherwise, flush the catheter with the rest of the solution from the syringe. 9. Remove the syringe from the injection cap. 10. Clamp the catheter.  Flushing your catheter after use 1. Wash your hands with soap and water. 2. Put on gloves. 3. Scrub the injection cap for 15 seconds with a disinfecting wipe. 4. Unclamp the catheter. 5. Attach the prefilled syringe to the injection cap. 6. Flush the catheter by pushing the plunger forward until all of the liquid from the syringe is in the catheter. 7. Remove the syringe from the injection cap. 8. Clamp the catheter. Problems and solutions  If blood cannot be completely cleared from the injection cap, you may need to have the injection cap replaced.  If the catheter is difficult to flush, use the pulsing method. The pulsing method involves pushing only a few milliliters of solution into the catheter at a time and pausing between pushes.  If you do not see blood in the catheter when you pull back on the syringe, change your body position, such as by raising your arms above your head. Take a deep breath and cough. Then, pull back on the syringe. If you still do not see blood, flush the catheter with a small amount of solution. Then, change positions again and take a breath or cough. Pull back on the syringe again. If you still do not see   blood, finish flushing the catheter and contact your health care provider. Do not use your catheter until your health care provider says it is okay. General tips  Have someone help you flush your catheter, if possible.  Do not force fluid through your catheter.  Do not use a syringe that is larger or smaller than 10 mL. Using a smaller syringe can make the catheter burst.  Do not use your catheter without flushing it first if it has heparin in it. Contact a health  care provider if:  You cannot see any blood in the catheter when you flush it before using it.  Your catheter is difficult to flush. Get help right away if:  You cannot flush the catheter.  The catheter leaks when you flush it or when there is fluid in it.  There are cracks or breaks in the catheter. Summary  It is important to flush your tunneled central venous catheter each time you use it, both before and after you use it.  Scrub the injection cap for 15 seconds with a disinfecting wipe before and after you flush it.  When you flush your catheter, make sure you follow any specific instructions from your health care provider or the manufacturer.  Get help right away if you cannot flush the catheter. This information is not intended to replace advice given to you by your health care provider. Make sure you discuss any questions you have with your health care provider. Document Released: 05/30/2011 Document Revised: 08/26/2018 Document Reviewed: 08/26/2018 Elsevier Patient Education  2020 [employer]vier Inc.  

## 2019-04-07 ENCOUNTER — Ambulatory Visit (INDEPENDENT_AMBULATORY_CARE_PROVIDER_SITE_OTHER): Payer: BC Managed Care – PPO | Admitting: Student

## 2019-04-07 ENCOUNTER — Other Ambulatory Visit: Payer: Self-pay

## 2019-04-07 VITALS — BP 110/74 | HR 89 | Ht 66.0 in | Wt 183.0 lb

## 2019-04-07 DIAGNOSIS — I472 Ventricular tachycardia: Secondary | ICD-10-CM

## 2019-04-07 DIAGNOSIS — I480 Paroxysmal atrial fibrillation: Secondary | ICD-10-CM

## 2019-04-07 DIAGNOSIS — I5022 Chronic systolic (congestive) heart failure: Secondary | ICD-10-CM | POA: Diagnosis not present

## 2019-04-07 DIAGNOSIS — I4729 Other ventricular tachycardia: Secondary | ICD-10-CM

## 2019-04-07 LAB — CUP PACEART INCLINIC DEVICE CHECK
Battery Remaining Longevity: 13 mo
Brady Statistic RA Percent Paced: 0.06 %
Brady Statistic RV Percent Paced: 1.4 %
Date Time Interrogation Session: 20201014130057
HighPow Impedance: 70.875
Implantable Lead Implant Date: 20110503
Implantable Lead Implant Date: 20130815
Implantable Lead Location: 753859
Implantable Lead Location: 753860
Implantable Pulse Generator Implant Date: 20110503
Lead Channel Impedance Value: 200 Ohm
Lead Channel Impedance Value: 400 Ohm
Lead Channel Pacing Threshold Amplitude: 0.5 V
Lead Channel Pacing Threshold Amplitude: 0.5 V
Lead Channel Pacing Threshold Amplitude: 2 V
Lead Channel Pacing Threshold Amplitude: 2 V
Lead Channel Pacing Threshold Pulse Width: 0.5 ms
Lead Channel Pacing Threshold Pulse Width: 0.5 ms
Lead Channel Pacing Threshold Pulse Width: 1.3 ms
Lead Channel Pacing Threshold Pulse Width: 1.3 ms
Lead Channel Sensing Intrinsic Amplitude: 12 mV
Lead Channel Sensing Intrinsic Amplitude: 2.9 mV
Lead Channel Setting Pacing Amplitude: 2 V
Lead Channel Setting Pacing Amplitude: 3 V
Lead Channel Setting Pacing Pulse Width: 1.3 ms
Lead Channel Setting Sensing Sensitivity: 0.5 mV
Pulse Gen Serial Number: 778164

## 2019-04-07 MED ORDER — SPIRONOLACTONE 25 MG PO TABS: 25.0000 mg | ORAL_TABLET | Freq: Every day | ORAL | 3 refills | Status: DC

## 2019-04-07 MED ORDER — FUROSEMIDE 20 MG PO TABS: 20.0000 mg | ORAL_TABLET | Freq: Every day | ORAL | 1 refills | Status: DC

## 2019-04-07 NOTE — Patient Instructions (Addendum)
Medication Instructions:   START TAKING  SPIRONOLACTONE 25 MG ONCE A DAY    If you need a refill on your cardiac medications before your next appointment, please call your pharmacy.   Lab work:  BMET TODAY   If you have labs (blood work) drawn today and your tests are completely normal, you will receive your results only by: Marland Kitchen MyChart Message (if you have MyChart) OR . A paper copy in the mail If you have any lab test that is abnormal or we need to change your treatment, we will call you to review the results.  Testing/Procedures:  NONE ORDERED  TODAY'   Follow-Up:  NEXT ASAP WITH COUMADIN CLINIC   At [facility], you and your health needs are our priority.  As part of our continuing mission to provide you with exceptional heart care, we have created designated Provider Care Teams.  These Care Teams include your primary Cardiologist (physician) and Advanced Practice Providers (APPs -  Physician Assistants and Nurse Practitioners) who all work together to provide you with the care you need, when you need it. You will need a follow up appointment in 1 years.  Please call our office 2 months in advance to schedule this appointment.  You may see  Lovena Le or one of the following Advanced Practice Providers on your designated Care Team:   Chanetta Marshall, NP . Tommye Standard, PA-C . Joesph July PA-C    Any Other Special Instructions Will Be Listed Below (If Applicable).

## 2019-04-07 NOTE — Progress Notes (Signed)
Electrophysiology Office Note Date: 04/07/2019  ID:  [name], DOB Jul 03, 1960, MRN [id number]  PCP: Patient, No Pcp Per Primary Cardiologist: Stacy Grooms, MD Electrophysiologist: Dr. Lovena Ritter  CC: Routine ICD follow-up  [name] is a 58 y.o. female with chronic systolic CHF, s/p MV repair -> MS -> transcatheter valve repair 2019 seen today for Dr. Lovena Ritter.  They present today for routine electrophysiology followup.  Since last being seen in our clinic, the patient reports doing very well. They deny chest pain, palpitations, dyspnea, PND, orthopnea, nausea, vomiting, dizziness, syncope, edema, weight gain, or early satiety.  She has not had ICD shocks.   Device History: StMudlogger ICD implanted 10/2009, RV lead revision 2013 for chronic systolic CHF History of appropriate therapy: No History of AAD therapy: No   Past Medical History:  Diagnosis Date  . Arthritis    "everywhere"  . Asthma    as a child  . Automatic implantable cardioverter-defibrillator in situ    DR. Beckie Ritter   . Bladder cancer [facility]) 2009   "injected medicine to get rid of it"  . Chronic systolic CHF (congestive heart failure) ([place])   . CKD (chronic kidney disease), stage III ([place])   . Coronary artery disease    a. large RV/inferior MI complicated by pseudoaneurysm s/p aneurysemectomy and CABG 10/2009 with papillary muscle rupture requiring bioprosthetic mitral valve replacement in [facility].  . Granulosa cell carcinoma of ovary ([place])    recurrent - surgical resection 2007, 2014 and 2016   . H/O thymectomy   . High cholesterol   . Hypothyroidism   . ICD (implantable cardiac defibrillator) in place   . Ischemic cardiomyopathy    a.  ICM and VT arrest 10/2009 s/p AICD (revision 01/2012 due to RV lead problem).  . Myocardial infarction ([place]) 10/14/2009  . Neuropathy    FEET AND HANDS - FROM CHEMO - BUT MUCH IMPROVED  . PAF (paroxysmal atrial fibrillation) ([place])   . Pain    JOINT PAINS AND MUSCLE ACHES ALL OVER.  Marland Kitchen Port-A-Cath in place    RIGHT UPPER CHEST  . Prosthetic valve dysfunction   . S/P mitral valve replacement with bioprosthetic valve 10/21/2009   Edwards Perimount stented bovine pericardial tissue valve, size 29 mm  . S/P valve-in-valve transcatheter mitral valve replacement 09/16/2017   29 mm Edwards Sapien 3 transcatheter heart valve placed via transapical approach  . SVT (supraventricular tachycardia) ([place])   . Tobacco abuse   . Ventricular tachycardia [facility])    Past Surgical History:  Procedure Laterality Date  . ABDOMINAL HYSTERECTOMY N/A 07/27/2013   Procedure: TUMOR EXCISION OF ABDOMINAL MASS;  Surgeon: Stacy Chapel, MD;  Location: [facility] ORS;  Service: Gynecology;  Laterality: N/A;  . APPENDECTOMY  1989  . BILATERAL OOPHORECTOMY  2007  . CARDIAC DEFIBRILLATOR PLACEMENT  02/06/2012   "lead change"  . CARDIAC VALVE REPLACEMENT    . CYSTOSCOPY Right 02/23/2013   Procedure: CYSTOSCOPY WITH STENT REMOVAL;  Surgeon: Stacy Chapel, MD;  Location: [facility];  Service: Gynecology;  Laterality: Right;  . IMPLANTABLE CARDIOVERTER DEFIBRILLATOR REVISION N/A 02/06/2012   Procedure: IMPLANTABLE CARDIOVERTER DEFIBRILLATOR REVISION;  Surgeon: Stacy Lance, MD;  Location: [facility] CATH LAB;  Service: Cardiovascular;  Laterality: N/A;  . INSERT / REPLACE / REMOVE PACEMAKER  10/2009   DUAL-CHAMBER St. JUDE DEFIBRILLATOR  . IR IMAGING GUIDED PORT INSERTION  05/11/2018  . LAPAROTOMY N/A 01/19/2013   Procedure: TUMOR DEBULKING /  BOWEL RESECTION/INSERTION RIGHT UTERERAL DOUBLE J STENT/OMENTECTOMY;  Surgeon: Stacy Chapel, MD;  Location: [facility];  Service: Gynecology;  Laterality: N/A;  . MITRAL VALVE REPLACEMENT  10/21/2009   81mm Edwards Perimount bovine pericardial tissue valve - [facility]  . RIGHT/LEFT HEART CATH AND CORONARY ANGIOGRAPHY N/A 07/02/2017   Procedure: RIGHT/LEFT HEART CATH AND CORONARY ANGIOGRAPHY;  Surgeon: Stacy Crome, MD;   Location: [place] CV LAB;  Service: Cardiovascular;  Laterality: N/A;  . TEE WITHOUT CARDIOVERSION N/A 09/16/2017   Procedure: TRANSESOPHAGEAL ECHOCARDIOGRAM (TEE);  Surgeon: Stacy Mocha, MD;  Location: [place];  Service: Open Heart Surgery;  Laterality: N/A;  . THYMECTOMY  2009  . TONSILLECTOMY AND ADENOIDECTOMY  ~ 1967  . TOTAL HIP ARTHROPLASTY  05/06/2012   Procedure: TOTAL HIP ARTHROPLASTY;  Surgeon: Stacy Alf, MD;  Location: [facility];  Service: Orthopedics;  Laterality: Right;  . TRANSCATHETER MITRAL VALVE REPLACEMENT, TRANSAPICAL N/A 09/16/2017   Procedure: TRANSCATHETER MITRAL VALVE REPLACEMENT,TRANSAPICAL;  Surgeon: Stacy Mocha, MD;  Location: [place];  Service: Open Heart Surgery;  Laterality: N/A;  . TRANSURETHRAL RESECTION OF BLADDER  2009   "for bladder cancer"  . TUBAL LIGATION  1993  . VAGINAL HYSTERECTOMY  2001    Current Outpatient Medications  Medication Sig Dispense Refill  . albuterol (VENTOLIN HFA) 108 (90 Base) MCG/ACT inhaler Inhale 2 puffs into the lungs every 6 (six) hours as needed for wheezing or shortness of breath. 8 g 1  . amoxicillin (AMOXIL) 500 MG tablet Take 2,000 mg by mouth. Take 2,000 mg one hour prior to dental visits.    Marland Kitchen aspirin EC 81 MG tablet Take 81 mg by mouth daily.    . carvedilol (COREG) 3.125 MG tablet TAKE 1 TABLET (3.125 MG TOTAL) BY MOUTH 2 (TWO) TIMES DAILY WITH A MEAL. 180 tablet 3  . furosemide (LASIX) 20 MG tablet Take 1 tablet (20 mg total) by mouth daily. 90 tablet 1  . lidocaine-prilocaine (EMLA) cream Apply 1 application topically as needed. Apply to port per instructions 30 g 0  . LORazepam (ATIVAN) 0.5 MG tablet TAKE 1 TABLET (0.5 MG TOTAL) BY MOUTH EVERY 6 (SIX) HOURS AS NEEDED. FOR ANXIETY 60 tablet 0  . methocarbamol (ROBAXIN) 500 MG tablet TAKE 1 TABLET (500 MG TOTAL) BY MOUTH EVERY 6 (SIX) HOURS AS NEEDED FOR MUSCLE SPASMS. 60 tablet 2  . traMADol (ULTRAM) 50 MG tablet Take 1 tablet (50 mg total) by mouth every 6  (six) hours as needed. 90 tablet 0  . Vitamin D, Ergocalciferol, (DRISDOL) 1.25 MG (50000 UT) CAPS capsule TAKE 1 CAPSULE (50,000 UNITS TOTAL) BY MOUTH EVERY 7 (SEVEN) DAYS. 12 capsule 3  . warfarin (COUMADIN) 7.5 MG tablet Take 1 tablet (7.5 mg total) by mouth daily at 6 PM. 90 tablet 0  . zolpidem (AMBIEN) 5 MG tablet TAKE 1 TABLET BY MOUTH EVERYDAY AT BEDTIME 30 tablet 2  . spironolactone (ALDACTONE) 25 MG tablet Take 1 tablet (25 mg total) by mouth daily. 90 tablet 3   No current facility-administered medications for this visit.    Facility-Administered Medications Ordered in Other Visits  Medication Dose Route Frequency Provider Last Rate Last Dose  . sodium chloride flush (NS) 0.9 % injection 10 mL  10 mL Intravenous PRN Volanda Napoleon, MD   10 mL at 09/14/18 1150    Allergies:   Prochlorperazine edisylate and Adhesive [tape]   Social History: Social History   Socioeconomic History  . Marital status: Divorced  Spouse name: Not on file  . Number of children: Not on file  . Years of education: Not on file  . Highest education level: Not on file  Occupational History    Comment: WORKS FULL TIME  Social Needs  . Financial resource strain: Not on file  . Food insecurity    Worry: Not on file    Inability: Not on file  . Transportation needs    Medical: Not on file    Non-medical: Not on file  Tobacco Use  . Smoking status: Current Some Day Smoker    Packs/day: 0.50    Years: 30.00    Pack years: 15.00    Types: Cigarettes    Start date: 03/28/1974  . Smokeless tobacco: Never Used  Substance and Sexual Activity  . Alcohol use: No    Alcohol/week: 0.0 standard drinks  . Drug use: No  . Sexual activity: Not on file  Lifestyle  . Physical activity    Days per week: Not on file    Minutes per session: Not on file  . Stress: Not on file  Relationships  . Social Herbalist on phone: Patient refused    Gets together: Patient refused    Attends religious  service: Patient refused    Active member of club or organization: Patient refused    Attends meetings of clubs or organizations: Patient refused    Relationship status: Patient refused  . Intimate partner violence    Fear of current or ex partner: Patient refused    Emotionally abused: Patient refused    Physically abused: Patient refused    Forced sexual activity: Patient refused  Other Topics Concern  . Not on file  Social History Narrative   WORKS FULL TIME   SINGLE   TOBACCO USE-YES   IMPLANTATION OF DUAL-CHAMBER St. JUDE DEFIBRILLATOR    Family History: Family History  Problem Relation Age of Onset  . Heart attack Paternal Grandfather   . Heart attack Maternal Grandfather   . Stroke Maternal Grandmother   . Heart attack Brother     Review of Systems: All other systems reviewed and are otherwise negative except as noted above.   Physical Exam: Vitals:   04/07/19 1207  BP: 110/74  Pulse: 89  Weight: 183 lb (83 kg)  Height: 5\' 6"  (1.676 m)     GEN- The patient is well appearing, alert and oriented x 3 today.   HEENT: normocephalic, atraumatic; sclera clear, conjunctiva pink; hearing intact; oropharynx clear; neck supple, no JVP Lymph- no cervical lymphadenopathy Lungs- Clear to ausculation bilaterally, normal work of breathing.  No wheezes, rales, rhonchi Heart- Regular rate and rhythm, no murmurs, rubs or gallops, PMI not laterally displaced GI- soft, non-tender, non-distended, bowel sounds present, no hepatosplenomegaly Extremities- no clubbing, cyanosis, or edema; DP/PT/radial pulses 2+ bilaterally MS- no significant deformity or atrophy Skin- warm and dry, no rash or lesion; ICD pocket well healed Psych- euthymic mood, full affect Neuro- strength and sensation are intact  ICD interrogation- reviewed in detail today,  See PACEART report  EKG:  EKG is ordered today. The ekg ordered today shows NSR 89 bpm, QRS 118 ms, PR 170 ms  Recent Labs: 06/11/2018:  Magnesium 1.2 02/12/2019: ALT 15; BUN 21; Creatinine 1.28; Hemoglobin 13.2; Platelet Count 164; Potassium 3.4; Sodium 139   Wt Readings from Last 3 Encounters:  04/07/19 183 lb (83 kg)  02/12/19 183 lb 1.9 oz (83.1 kg)  09/14/18 185 lb (83.9 kg)  Other studies Reviewed: Additional studies/ records that were reviewed today include: Echo 08/2018 shows LVEF 40-45% and Stable MVR, previous EP notes, most recent labs.   Assessment and Plan:  1.  Chronic systolic dysfunction euvolemic today Stable on an appropriate medical regimen Normal ICD function See Pace Art report No changes today Resume spironolactone 25 mg daily with chronic low K. She has been out for a few weeks. BMET today.  2. VT Continue current medications BMET today. May need extra K supp.   3. PAF Continue coumadin Low burden, continue current regimen.  Current medicines are reviewed at length with the patient today.   The patient does not have concerns regarding her medicines.  The following changes were made today:  Continue lasix (added back to list), resume spironolactone. BMET today.   Labs/ tests ordered today include:  Orders Placed This Encounter  Procedures  . Basic metabolic panel  . CUP PACEART [place]  . EKG 12-Lead   Disposition:   Follow up with Dr. Lovena Ritter in 1 year. Monthly battery checks.   Jacalyn Lefevre, PA-C  04/07/2019 1:20 PM  [place] [address] [phone] (office) [phone] (fax)

## 2019-04-08 ENCOUNTER — Other Ambulatory Visit: Payer: Self-pay | Admitting: *Deleted

## 2019-04-08 DIAGNOSIS — E876 Hypokalemia: Secondary | ICD-10-CM

## 2019-04-08 LAB — BASIC METABOLIC PANEL
BUN/Creatinine Ratio: 9 (ref 9–23)
BUN: 11 mg/dL (ref 6–24)
CO2: 24 mmol/L (ref 20–29)
Calcium: 9.3 mg/dL (ref 8.7–10.2)
Chloride: 100 mmol/L (ref 96–106)
Creatinine, Ser: 1.25 mg/dL — ABNORMAL HIGH (ref 0.57–1.00)
GFR calc Af Amer: 54 mL/min/{1.73_m2} — ABNORMAL LOW (ref >59)
GFR calc non Af Amer: 47 mL/min/{1.73_m2} — ABNORMAL LOW (ref >59)
Glucose: 92 mg/dL (ref 65–99)
Potassium: 3.3 mmol/L — ABNORMAL LOW (ref 3.5–5.2)
Sodium: 140 mmol/L (ref 134–144)

## 2019-04-08 MED ORDER — POTASSIUM CHLORIDE ER 20 MEQ PO TBCR: 20.0000 meq | EXTENDED_RELEASE_TABLET | ORAL | 3 refills | Status: DC | PRN

## 2019-04-16 ENCOUNTER — Ambulatory Visit (INDEPENDENT_AMBULATORY_CARE_PROVIDER_SITE_OTHER): Payer: BC Managed Care – PPO | Admitting: *Deleted

## 2019-04-16 ENCOUNTER — Other Ambulatory Visit: Payer: Self-pay

## 2019-04-16 ENCOUNTER — Other Ambulatory Visit: Payer: BC Managed Care – PPO | Admitting: *Deleted

## 2019-04-16 DIAGNOSIS — Z952 Presence of prosthetic heart valve: Secondary | ICD-10-CM

## 2019-04-16 DIAGNOSIS — Z953 Presence of xenogenic heart valve: Secondary | ICD-10-CM

## 2019-04-16 DIAGNOSIS — I480 Paroxysmal atrial fibrillation: Secondary | ICD-10-CM

## 2019-04-16 DIAGNOSIS — Z5181 Encounter for therapeutic drug level monitoring: Secondary | ICD-10-CM

## 2019-04-16 DIAGNOSIS — E876 Hypokalemia: Secondary | ICD-10-CM

## 2019-04-16 LAB — POCT INR: INR: 3.4 — AB (ref 2.0–3.0)

## 2019-04-16 NOTE — Patient Instructions (Addendum)
Reiterated the nature of anticoagulants.  A benefit risk analysis has been presented to the patient, so that they understand the justification for choosing anticoagulation at this time. The need for frequent and regular monitoring, precise dosage adjustment and compliance is stressed.  Side effects of potential bleeding are discussed.  The patient should avoid any OTC items containing aspirin or ibuprofen, and should avoid great swings in general diet.  Avoid alcohol consumption.  Call if any signs of abnormal bleeding.   Description   Hold today's dose then continue taking 1 tablet (7.5mg ) daily.  Recheck INR in 2 weeks. Continue normal dark green consistency.  C[facility]in Clinic [phone] Main [phone]

## 2019-04-17 LAB — BASIC METABOLIC PANEL
BUN/Creatinine Ratio: 14 (ref 9–23)
BUN: 18 mg/dL (ref 6–24)
CO2: 22 mmol/L (ref 20–29)
Calcium: 9.6 mg/dL (ref 8.7–10.2)
Chloride: 103 mmol/L (ref 96–106)
Creatinine, Ser: 1.25 mg/dL — ABNORMAL HIGH (ref 0.57–1.00)
GFR calc Af Amer: 54 mL/min/{1.73_m2} — ABNORMAL LOW (ref >59)
GFR calc non Af Amer: 47 mL/min/{1.73_m2} — ABNORMAL LOW (ref >59)
Glucose: 85 mg/dL (ref 65–99)
Potassium: 3.7 mmol/L (ref 3.5–5.2)
Sodium: 142 mmol/L (ref 134–144)

## 2019-04-18 ENCOUNTER — Other Ambulatory Visit: Payer: Self-pay | Admitting: Family

## 2019-04-21 ENCOUNTER — Other Ambulatory Visit: Payer: Self-pay | Admitting: *Deleted

## 2019-04-21 DIAGNOSIS — D3910 Neoplasm of uncertain behavior of unspecified ovary: Secondary | ICD-10-CM

## 2019-04-21 MED ORDER — TRAMADOL HCL 50 MG PO TABS: 50.0000 mg | ORAL_TABLET | Freq: Four times a day (QID) | ORAL | 0 refills | Status: DC | PRN

## 2019-04-22 NOTE — Progress Notes (Signed)
The patient has been notified of the result and verbalized understanding. She will start on Potassium 20 meq daily and is scheduled for a repeat BMET on 05/06/19. All questions (if any) were answered.

## 2019-04-22 NOTE — Addendum Note (Signed)
Addended by: Antonieta Iba on: 04/22/2019 08:24 AM   Modules accepted: Orders

## 2019-05-06 ENCOUNTER — Ambulatory Visit (INDEPENDENT_AMBULATORY_CARE_PROVIDER_SITE_OTHER): Payer: BC Managed Care – PPO | Admitting: *Deleted

## 2019-05-06 ENCOUNTER — Other Ambulatory Visit: Payer: BC Managed Care – PPO | Admitting: *Deleted

## 2019-05-06 ENCOUNTER — Other Ambulatory Visit: Payer: Self-pay

## 2019-05-06 DIAGNOSIS — Z953 Presence of xenogenic heart valve: Secondary | ICD-10-CM

## 2019-05-06 DIAGNOSIS — I480 Paroxysmal atrial fibrillation: Secondary | ICD-10-CM | POA: Diagnosis not present

## 2019-05-06 DIAGNOSIS — E876 Hypokalemia: Secondary | ICD-10-CM

## 2019-05-06 DIAGNOSIS — Z952 Presence of prosthetic heart valve: Secondary | ICD-10-CM

## 2019-05-06 LAB — BASIC METABOLIC PANEL
BUN/Creatinine Ratio: 9 (ref 9–23)
BUN: 13 mg/dL (ref 6–24)
CO2: 22 mmol/L (ref 20–29)
Calcium: 9 mg/dL (ref 8.7–10.2)
Chloride: 103 mmol/L (ref 96–106)
Creatinine, Ser: 1.41 mg/dL — ABNORMAL HIGH (ref 0.57–1.00)
GFR calc Af Amer: 47 mL/min/{1.73_m2} — ABNORMAL LOW (ref >59)
GFR calc non Af Amer: 41 mL/min/{1.73_m2} — ABNORMAL LOW (ref >59)
Glucose: 75 mg/dL (ref 65–99)
Potassium: 4.3 mmol/L (ref 3.5–5.2)
Sodium: 141 mmol/L (ref 134–144)

## 2019-05-06 LAB — POCT INR: INR: 5.3 — AB (ref 2.0–3.0)

## 2019-05-06 NOTE — Patient Instructions (Signed)
Description   Hold today and tomorrow's dose then start taking 1 tablet (7.5mg ) daily except 1/2 tablet (3.75mg ) on Mondays.  Recheck INR in 2 weeks. Continue normal dark green consistency.  Coumadin Clinic [phone] Main [phone]

## 2019-05-10 ENCOUNTER — Telehealth: Payer: Self-pay | Admitting: Internal Medicine

## 2019-05-10 ENCOUNTER — Ambulatory Visit (INDEPENDENT_AMBULATORY_CARE_PROVIDER_SITE_OTHER): Payer: BC Managed Care – PPO | Admitting: *Deleted

## 2019-05-10 ENCOUNTER — Other Ambulatory Visit: Payer: Self-pay | Admitting: *Deleted

## 2019-05-10 DIAGNOSIS — I472 Ventricular tachycardia: Secondary | ICD-10-CM

## 2019-05-10 DIAGNOSIS — I5022 Chronic systolic (congestive) heart failure: Secondary | ICD-10-CM

## 2019-05-10 DIAGNOSIS — I4729 Other ventricular tachycardia: Secondary | ICD-10-CM

## 2019-05-10 DIAGNOSIS — I480 Paroxysmal atrial fibrillation: Secondary | ICD-10-CM

## 2019-05-10 NOTE — Telephone Encounter (Signed)
Returning call in regards to lab results.

## 2019-05-11 ENCOUNTER — Other Ambulatory Visit: Payer: Self-pay | Admitting: *Deleted

## 2019-05-11 ENCOUNTER — Other Ambulatory Visit: Payer: Self-pay | Admitting: Hematology & Oncology

## 2019-05-11 DIAGNOSIS — I5022 Chronic systolic (congestive) heart failure: Secondary | ICD-10-CM

## 2019-05-11 LAB — CUP PACEART REMOTE DEVICE CHECK
Battery Remaining Longevity: 9 mo
Battery Remaining Percentage: 9 %
Battery Voltage: 2.68 V
Brady Statistic AP VP Percent: 0 %
Brady Statistic AP VS Percent: 1 %
Brady Statistic AS VP Percent: 1 %
Brady Statistic AS VS Percent: 99 %
Brady Statistic RA Percent Paced: 1 %
Brady Statistic RV Percent Paced: 1 %
Date Time Interrogation Session: 20201116103643
HighPow Impedance: 65 Ohm
HighPow Impedance: 65 Ohm
Implantable Lead Implant Date: 20110503
Implantable Lead Implant Date: 20130815
Implantable Lead Location: 753859
Implantable Lead Location: 753860
Implantable Pulse Generator Implant Date: 20110503
Lead Channel Impedance Value: 200 Ohm
Lead Channel Impedance Value: 410 Ohm
Lead Channel Pacing Threshold Amplitude: 0.5 V
Lead Channel Pacing Threshold Amplitude: 2 V
Lead Channel Pacing Threshold Pulse Width: 0.5 ms
Lead Channel Pacing Threshold Pulse Width: 1.3 ms
Lead Channel Sensing Intrinsic Amplitude: 12 mV
Lead Channel Sensing Intrinsic Amplitude: 2.2 mV
Lead Channel Setting Pacing Amplitude: 2 V
Lead Channel Setting Pacing Amplitude: 3 V
Lead Channel Setting Pacing Pulse Width: 1.3 ms
Lead Channel Setting Sensing Sensitivity: 0.5 mV
Pulse Gen Serial Number: 778164

## 2019-05-14 ENCOUNTER — Encounter: Payer: Self-pay | Admitting: Hematology & Oncology

## 2019-05-14 ENCOUNTER — Inpatient Hospital Stay: Payer: BC Managed Care – PPO

## 2019-05-14 ENCOUNTER — Other Ambulatory Visit: Payer: Self-pay

## 2019-05-14 ENCOUNTER — Telehealth: Payer: Self-pay | Admitting: Hematology & Oncology

## 2019-05-14 ENCOUNTER — Inpatient Hospital Stay: Payer: BC Managed Care – PPO | Attending: Hematology & Oncology | Admitting: Hematology & Oncology

## 2019-05-14 VITALS — BP 103/69 | HR 89 | Temp 96.8°F | Resp 18 | Wt 179.8 lb

## 2019-05-14 DIAGNOSIS — J449 Chronic obstructive pulmonary disease, unspecified: Secondary | ICD-10-CM | POA: Diagnosis not present

## 2019-05-14 DIAGNOSIS — Z7982 Long term (current) use of aspirin: Secondary | ICD-10-CM | POA: Insufficient documentation

## 2019-05-14 DIAGNOSIS — Z7901 Long term (current) use of anticoagulants: Secondary | ICD-10-CM | POA: Diagnosis not present

## 2019-05-14 DIAGNOSIS — Z79899 Other long term (current) drug therapy: Secondary | ICD-10-CM | POA: Diagnosis not present

## 2019-05-14 DIAGNOSIS — D3910 Neoplasm of uncertain behavior of unspecified ovary: Secondary | ICD-10-CM

## 2019-05-14 DIAGNOSIS — I5022 Chronic systolic (congestive) heart failure: Secondary | ICD-10-CM

## 2019-05-14 DIAGNOSIS — Z95828 Presence of other vascular implants and grafts: Secondary | ICD-10-CM

## 2019-05-14 LAB — CMP (CANCER CENTER ONLY)
ALT: 13 U/L (ref 0–44)
AST: 15 U/L (ref 15–41)
Albumin: 4.2 g/dL (ref 3.5–5.0)
Alkaline Phosphatase: 60 U/L (ref 38–126)
Anion gap: 9 (ref 5–15)
BUN: 17 mg/dL (ref 6–20)
CO2: 29 mmol/L (ref 22–32)
Calcium: 9.4 mg/dL (ref 8.9–10.3)
Chloride: 102 mmol/L (ref 98–111)
Creatinine: 1.36 mg/dL — ABNORMAL HIGH (ref 0.44–1.00)
GFR, Est AFR Am: 49 mL/min — ABNORMAL LOW (ref >60)
GFR, Estimated: 42 mL/min — ABNORMAL LOW (ref >60)
Glucose, Bld: 89 mg/dL (ref 70–99)
Potassium: 3.6 mmol/L (ref 3.5–5.1)
Sodium: 140 mmol/L (ref 135–145)
Total Bilirubin: 0.4 mg/dL (ref 0.3–1.2)
Total Protein: 6.9 g/dL (ref 6.5–8.1)

## 2019-05-14 LAB — CBC WITH DIFFERENTIAL (CANCER CENTER ONLY)
Abs Immature Granulocytes: 0.03 10*3/uL (ref 0.00–0.07)
Basophils Absolute: 0 10*3/uL (ref 0.0–0.1)
Basophils Relative: 0 %
Eosinophils Absolute: 0.1 10*3/uL (ref 0.0–0.5)
Eosinophils Relative: 2 %
HCT: 37 % (ref 36.0–46.0)
Hemoglobin: 12.5 g/dL (ref 12.0–15.0)
Immature Granulocytes: 0 %
Lymphocytes Relative: 9 %
Lymphs Abs: 0.6 10*3/uL — ABNORMAL LOW (ref 0.7–4.0)
MCH: 31.7 pg (ref 26.0–34.0)
MCHC: 33.8 g/dL (ref 30.0–36.0)
MCV: 93.9 fL (ref 80.0–100.0)
Monocytes Absolute: 0.7 10*3/uL (ref 0.1–1.0)
Monocytes Relative: 10 %
Neutro Abs: 5.3 10*3/uL (ref 1.7–7.7)
Neutrophils Relative %: 79 %
Platelet Count: 225 10*3/uL (ref 150–400)
RBC: 3.94 MIL/uL (ref 3.87–5.11)
RDW: 13.1 % (ref 11.5–15.5)
WBC Count: 6.7 10*3/uL (ref 4.0–10.5)
nRBC: 0 % (ref 0.0–0.2)

## 2019-05-14 LAB — LACTATE DEHYDROGENASE: LDH: 204 U/L — ABNORMAL HIGH (ref 98–192)

## 2019-05-14 MED ORDER — HEPARIN SOD (PORK) LOCK FLUSH 100 UNIT/ML IV SOLN
500.0000 [IU] | Freq: Once | INTRAVENOUS | Status: AC
  Administered 2019-05-14: 500 [IU] via INTRAVENOUS
  Filled 2019-05-14: qty 5

## 2019-05-14 MED ORDER — LORAZEPAM 0.5 MG PO TABS: 0.5000 mg | ORAL_TABLET | Freq: Four times a day (QID) | ORAL | 0 refills | Status: DC | PRN

## 2019-05-14 MED ORDER — SODIUM CHLORIDE 0.9% FLUSH
10.0000 mL | INTRAVENOUS | Status: DC | PRN
  Administered 2019-05-14: 10 mL via INTRAVENOUS
  Filled 2019-05-14: qty 10

## 2019-05-14 MED ORDER — TRAMADOL HCL 50 MG PO TABS: 50.0000 mg | ORAL_TABLET | Freq: Four times a day (QID) | ORAL | 0 refills | Status: DC | PRN

## 2019-05-14 NOTE — Progress Notes (Signed)
Hematology and Oncology Follow Up Visit  [name] [id number] [birth year] 58 y.o. 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 is here today for follow-up.  She is doing pretty well.  She is a little bit sad because her mother was found to have recurrent ovarian cancer.  She has elected not to take any treatment.  She is not a nursing home noted, still.  For follow-up of also is having problems.  He has COPD and he is not doing all that well.  She has been managing okay.  She has been working.  The coronavirus still is causing some issues.  Her inhibin B level on last saw her was 8.4.  She is being followed by cardiology.  She is on Coumadin.  Cardiology is following this.  She has had no bleeding.  There is no change in bowel bladder habits.  She has had no leg swelling.  She has had no cough.  There is been no fever.  Has had no headache.  Overall, her performance status is ECOG 0..    Medications:  Allergies as of 05/14/2019      Reactions   Prochlorperazine Edisylate Other (See Comments)   Nervous/ flutter/ shakes   Adhesive [tape] Hives   Redness/hives from adhesive tape( tolerates latex gloves); tolerates paper tape      Medication List       Accurate as of May 14, 2019  9:57 AM. If you have any questions, ask your nurse or doctor.        albuterol 108 (90 Base) MCG/ACT inhaler Commonly known as: VENTOLIN HFA Inhale 2 puffs into the lungs every 6 (six) hours as needed for wheezing or shortness of breath.   amoxicillin 500 MG tablet Commonly known as: AMOXIL Take 2,000 mg by mouth. Take 2,000 mg one hour prior to dental visits.   aspirin EC 81 MG tablet Take 81 mg by mouth daily.   carvedilol 3.125 MG tablet Commonly known as: COREG TAKE 1 TABLET (3.125 MG TOTAL) BY MOUTH 2 (TWO) TIMES DAILY WITH  A MEAL.   furosemide 20 MG tablet Commonly known as: LASIX Take 1 tablet (20 mg total) by mouth daily.   lidocaine-prilocaine cream Commonly known as: EMLA Apply 1 application topically as needed. Apply to port per instructions   LORazepam 0.5 MG tablet Commonly known as: ATIVAN TAKE 1 TABLET (0.5 MG TOTAL) BY MOUTH EVERY 6 (SIX) HOURS AS NEEDED. FOR ANXIETY   methocarbamol 500 MG tablet Commonly known as: ROBAXIN TAKE 1 TABLET (500 MG TOTAL) BY MOUTH EVERY 6 (SIX) HOURS AS NEEDED FOR MUSCLE SPASMS.   Potassium Chloride ER 20 MEQ Tbcr Take 20 mEq by mouth as needed.   spironolactone 25 MG tablet Commonly known as: ALDACTONE Take 1 tablet (25 mg total) by mouth daily.   traMADol 50 MG tablet Commonly known as: ULTRAM Take 1 tablet (50 mg total) by mouth every 6 (six) hours as needed.   Vitamin D (Ergocalciferol) 1.25 MG (50000 UT) Caps capsule Commonly known as: DRISDOL TAKE 1 CAPSULE (50,000 UNITS TOTAL) BY MOUTH EVERY 7 (SEVEN) DAYS.   warfarin 7.5 MG tablet Commonly known as: COUMADIN Take as directed by the anticoagulation clinic. If you are unsure how to take this medication, talk to your nurse or doctor. Original instructions: Take 1 tablet (7.5 mg total) by mouth  daily at 6 PM.   zolpidem 5 MG tablet Commonly known as: AMBIEN TAKE 1 TABLET BY MOUTH EVERYDAY AT BEDTIME       Allergies:  Allergies  Allergen Reactions  . Prochlorperazine Edisylate Other (See Comments)    Nervous/ flutter/ shakes  . Adhesive [Tape] Hives    Redness/hives from adhesive tape( tolerates latex gloves); tolerates paper tape    Past Medical History, Surgical history, Social history, and Family History were reviewed and updated.  Review of Systems:  Review of Systems  Constitutional: Negative.   HENT: Negative.   Eyes: Negative.   Respiratory: Negative.   Cardiovascular: Negative.   Gastrointestinal: Negative.   Genitourinary: Negative.   Musculoskeletal: Negative.    Skin: Negative.   Neurological: Negative.   Endo/Heme/Allergies: Negative.   Psychiatric/Behavioral: Negative.       Exam:  weight is 179 lb 12 oz (81.5 kg). Her temporal temperature is 96.8 F (36 C) (abnormal). Her blood pressure is 103/69 and her pulse is 89. Her respiration is 18 and oxygen saturation is 97%.   Wt Readings from Last 3 Encounters:  05/14/19 179 lb 12 oz (81.5 kg)  04/07/19 183 lb (83 kg)  02/12/19 183 lb 1.9 oz (83.1 kg)    Physical Exam Vitals signs reviewed.  HENT:     Head: Normocephalic and atraumatic.  Eyes:     Pupils: Pupils are equal, round, and reactive to light.  Neck:     Musculoskeletal: Normal range of motion.  Cardiovascular:     Rate and Rhythm: Normal rate and regular rhythm.     Heart sounds: Normal heart sounds.  Pulmonary:     Effort: Pulmonary effort is normal.     Breath sounds: Normal breath sounds.  Abdominal:     General: Bowel sounds are normal.     Palpations: Abdomen is soft.  Musculoskeletal: Normal range of motion.        General: No tenderness or deformity.  Lymphadenopathy:     Cervical: No cervical adenopathy.  Skin:    General: Skin is warm and dry.     Findings: No erythema or rash.  Neurological:     Mental Status: She is alert and oriented to person, place, and time.  Psychiatric:        Behavior: Behavior normal.        Thought Content: Thought content normal.        Judgment: Judgment normal.      Lab Results  Component Value Date   WBC 6.7 05/14/2019   HGB 12.5 05/14/2019   HCT 37.0 05/14/2019   MCV 93.9 05/14/2019   PLT 225 05/14/2019   Lab Results  Component Value Date   FERRITIN 181 07/24/2018   IRON 69 07/24/2018   TIBC 286 07/24/2018   UIBC 217 07/24/2018   IRONPCTSAT 24 07/24/2018   Lab Results  Component Value Date   RETICCTPCT 3.1 07/24/2018   RBC 3.94 05/14/2019   No results found for: KPAFRELGTCHN, LAMBDASER, KAPLAMBRATIO No results found for: IGGSERUM, IGA, IGMSERUM No  results found for: Odetta Pink, SPEI   Chemistry      Component Value Date/Time   NA 140 05/14/2019 0920   NA 141 05/06/2019 0000   NA 140 03/15/2016 0953   K 3.6 05/14/2019 0920   K 3.9 03/15/2016 0953   CL 102 05/14/2019 0920   CL 102 12/13/2015 0803   CO2 29 05/14/2019 0920   CO2 19 (L) 03/15/2016  0953   BUN 17 05/14/2019 0920   BUN 13 05/06/2019 0000   BUN 15.9 03/15/2016 0953   CREATININE 1.36 (H) 05/14/2019 0920   CREATININE 1.4 (H) 03/15/2016 0953      Component Value Date/Time   CALCIUM 9.4 05/14/2019 0920   CALCIUM 9.3 03/15/2016 0953   ALKPHOS 60 05/14/2019 0920   ALKPHOS 55 03/15/2016 0953   AST 15 05/14/2019 0920   AST 17 03/15/2016 0953   ALT 13 05/14/2019 0920   ALT 23 03/15/2016 0953   BILITOT 0.4 05/14/2019 0920   BILITOT 0.38 03/15/2016 0953       Impression and Plan: Ms. Jaillet is a very pleasant 59 yo caucasian female with recurrent granulosa cell tumor.  I think everything will hinge on the inhibin B level.  Hopefully, we will see that it will stay low and stable for a while.  I will get her back in 4 months.  I think this is reasonable.  I do not see need for any type of scans that we have to do right now.    She will always let us know if she has any problems.  I am just sad about her mom.  It sounds like her mother probably has another couple months at best.     Volanda Napoleon, MD 11/20/20209:57 AM

## 2019-05-14 NOTE — Telephone Encounter (Signed)
Called and LMVM w/ date/time of added appointments per 11/20 los

## 2019-05-18 LAB — INHIBIN B: Inhibin B: 7.3 pg/mL (ref 0.0–16.9)

## 2019-05-19 ENCOUNTER — Telehealth: Payer: Self-pay | Admitting: *Deleted

## 2019-05-19 ENCOUNTER — Other Ambulatory Visit: Payer: Self-pay | Admitting: Hematology & Oncology

## 2019-05-19 ENCOUNTER — Ambulatory Visit (INDEPENDENT_AMBULATORY_CARE_PROVIDER_SITE_OTHER): Payer: BC Managed Care – PPO | Admitting: *Deleted

## 2019-05-19 ENCOUNTER — Other Ambulatory Visit: Payer: Self-pay

## 2019-05-19 DIAGNOSIS — Z953 Presence of xenogenic heart valve: Secondary | ICD-10-CM

## 2019-05-19 DIAGNOSIS — Z952 Presence of prosthetic heart valve: Secondary | ICD-10-CM

## 2019-05-19 DIAGNOSIS — D3910 Neoplasm of uncertain behavior of unspecified ovary: Secondary | ICD-10-CM

## 2019-05-19 DIAGNOSIS — I480 Paroxysmal atrial fibrillation: Secondary | ICD-10-CM | POA: Diagnosis not present

## 2019-05-19 LAB — POCT INR: INR: 2 (ref 2.0–3.0)

## 2019-05-19 MED ORDER — WARFARIN SODIUM 7.5 MG PO TABS: ORAL_TABLET | ORAL | 0 refills | Status: DC

## 2019-05-19 NOTE — Telephone Encounter (Signed)
Notified pt of results. Pt verbalized understanding. No concerns at this time. 

## 2019-05-19 NOTE — Patient Instructions (Signed)
Description   Continue taking 1 tablet (7.5mg ) daily except 1/2 tablet (3.75mg ) on Mondays.  Recheck INR in 3 weeks. Continue normal dark green consistency.  [facility] [phone] Main [phone]

## 2019-05-19 NOTE — Telephone Encounter (Signed)
-----   Message from Volanda Napoleon, MD sent at 05/18/2019  4:51 PM EST ----- Call - the Inhibin B level is stable at 7.3!!!  Great job!!!  [name]

## 2019-05-21 ENCOUNTER — Other Ambulatory Visit: Payer: Self-pay | Admitting: Hematology & Oncology

## 2019-06-04 NOTE — Progress Notes (Signed)
Remote ICD transmission.   

## 2019-06-04 NOTE — Addendum Note (Signed)
Addended by: Douglass Rivers D on: 06/04/2019 10:11 AM   Modules accepted: Level of Service

## 2019-06-08 ENCOUNTER — Other Ambulatory Visit: Payer: Self-pay | Admitting: Hematology & Oncology

## 2019-06-09 ENCOUNTER — Other Ambulatory Visit: Payer: BC Managed Care – PPO

## 2019-06-09 ENCOUNTER — Ambulatory Visit (INDEPENDENT_AMBULATORY_CARE_PROVIDER_SITE_OTHER): Payer: BC Managed Care – PPO | Admitting: *Deleted

## 2019-06-09 DIAGNOSIS — Z95810 Presence of automatic (implantable) cardiac defibrillator: Secondary | ICD-10-CM

## 2019-06-09 LAB — CUP PACEART REMOTE DEVICE CHECK
Battery Remaining Longevity: 8 mo
Battery Remaining Percentage: 9 %
Battery Voltage: 2.66 V
Brady Statistic AP VP Percent: 0 %
Brady Statistic AP VS Percent: 1 %
Brady Statistic AS VP Percent: 1 %
Brady Statistic AS VS Percent: 99 %
Brady Statistic RA Percent Paced: 1 %
Brady Statistic RV Percent Paced: 1 %
Date Time Interrogation Session: 20201216040015
HighPow Impedance: 68 Ohm
HighPow Impedance: 68 Ohm
Implantable Lead Implant Date: 20110503
Implantable Lead Implant Date: 20130815
Implantable Lead Location: 753859
Implantable Lead Location: 753860
Implantable Pulse Generator Implant Date: 20110503
Lead Channel Impedance Value: 200 Ohm
Lead Channel Impedance Value: 390 Ohm
Lead Channel Pacing Threshold Amplitude: 0.5 V
Lead Channel Pacing Threshold Amplitude: 2 V
Lead Channel Pacing Threshold Pulse Width: 0.5 ms
Lead Channel Pacing Threshold Pulse Width: 1.3 ms
Lead Channel Sensing Intrinsic Amplitude: 11.4 mV
Lead Channel Sensing Intrinsic Amplitude: 2.2 mV
Lead Channel Setting Pacing Amplitude: 2 V
Lead Channel Setting Pacing Amplitude: 3 V
Lead Channel Setting Pacing Pulse Width: 1.3 ms
Lead Channel Setting Sensing Sensitivity: 0.5 mV
Pulse Gen Serial Number: 778164

## 2019-06-11 ENCOUNTER — Ambulatory Visit (INDEPENDENT_AMBULATORY_CARE_PROVIDER_SITE_OTHER): Payer: BC Managed Care – PPO | Admitting: *Deleted

## 2019-06-11 ENCOUNTER — Other Ambulatory Visit: Payer: BC Managed Care – PPO | Admitting: *Deleted

## 2019-06-11 ENCOUNTER — Other Ambulatory Visit: Payer: Self-pay

## 2019-06-11 DIAGNOSIS — Z952 Presence of prosthetic heart valve: Secondary | ICD-10-CM | POA: Diagnosis not present

## 2019-06-11 DIAGNOSIS — Z953 Presence of xenogenic heart valve: Secondary | ICD-10-CM | POA: Diagnosis not present

## 2019-06-11 DIAGNOSIS — I5022 Chronic systolic (congestive) heart failure: Secondary | ICD-10-CM

## 2019-06-11 DIAGNOSIS — I480 Paroxysmal atrial fibrillation: Secondary | ICD-10-CM | POA: Diagnosis not present

## 2019-06-11 LAB — POCT INR: INR: 2.3 (ref 2.0–3.0)

## 2019-06-11 NOTE — Patient Instructions (Signed)
Description   Continue taking 1 tablet (7.5mg ) daily except 1/2 tablet (3.75mg ) on Mondays.  Recheck INR in 4 weeks. Continue normal dark green consistency.  [facility] [phone] Main [phone]

## 2019-06-12 ENCOUNTER — Other Ambulatory Visit: Payer: Self-pay | Admitting: Student

## 2019-06-12 DIAGNOSIS — E876 Hypokalemia: Secondary | ICD-10-CM

## 2019-06-12 LAB — BASIC METABOLIC PANEL
BUN/Creatinine Ratio: 11 (ref 9–23)
BUN: 12 mg/dL (ref 6–24)
CO2: 22 mmol/L (ref 20–29)
Calcium: 9.3 mg/dL (ref 8.7–10.2)
Chloride: 101 mmol/L (ref 96–106)
Creatinine, Ser: 1.08 mg/dL — ABNORMAL HIGH (ref 0.57–1.00)
GFR calc Af Amer: 65 mL/min/{1.73_m2} (ref >59)
GFR calc non Af Amer: 56 mL/min/{1.73_m2} — ABNORMAL LOW (ref >59)
Glucose: 80 mg/dL (ref 65–99)
Potassium: 3.2 mmol/L — ABNORMAL LOW (ref 3.5–5.2)
Sodium: 141 mmol/L (ref 134–144)

## 2019-06-12 MED ORDER — POTASSIUM CHLORIDE ER 20 MEQ PO TBCR: 40.0000 meq | EXTENDED_RELEASE_TABLET | Freq: Every day | ORAL | 3 refills | Status: DC

## 2019-06-13 ENCOUNTER — Other Ambulatory Visit: Payer: Self-pay | Admitting: Hematology & Oncology

## 2019-06-13 DIAGNOSIS — D3910 Neoplasm of uncertain behavior of unspecified ovary: Secondary | ICD-10-CM

## 2019-06-14 ENCOUNTER — Telehealth: Payer: Self-pay

## 2019-06-14 DIAGNOSIS — Z79899 Other long term (current) drug therapy: Secondary | ICD-10-CM

## 2019-06-14 NOTE — Telephone Encounter (Signed)
I spoke to the patient and arranged labs 12/29 (BMET).  She verbalized understanding.

## 2019-06-22 ENCOUNTER — Other Ambulatory Visit: Payer: BC Managed Care – PPO | Admitting: *Deleted

## 2019-06-22 ENCOUNTER — Other Ambulatory Visit: Payer: Self-pay

## 2019-06-22 DIAGNOSIS — Z79899 Other long term (current) drug therapy: Secondary | ICD-10-CM

## 2019-06-23 ENCOUNTER — Telehealth: Payer: Self-pay

## 2019-06-23 DIAGNOSIS — E876 Hypokalemia: Secondary | ICD-10-CM

## 2019-06-23 LAB — BASIC METABOLIC PANEL
BUN/Creatinine Ratio: 16 (ref 9–23)
BUN: 21 mg/dL (ref 6–24)
CO2: 20 mmol/L (ref 20–29)
Calcium: 9.6 mg/dL (ref 8.7–10.2)
Chloride: 102 mmol/L (ref 96–106)
Creatinine, Ser: 1.29 mg/dL — ABNORMAL HIGH (ref 0.57–1.00)
GFR calc Af Amer: 52 mL/min/{1.73_m2} — ABNORMAL LOW (ref >59)
GFR calc non Af Amer: 45 mL/min/{1.73_m2} — ABNORMAL LOW (ref >59)
Glucose: 82 mg/dL (ref 65–99)
Potassium: 4.3 mmol/L (ref 3.5–5.2)
Sodium: 138 mmol/L (ref 134–144)

## 2019-06-23 NOTE — Telephone Encounter (Signed)
The patient's husband has been notified of the result and verbalized understanding.  All questions (if any) were answered. Frederik Schmidt, RN 06/23/2019 8:09 AM

## 2019-06-23 NOTE — Telephone Encounter (Signed)
-----   Message from Shirley Friar, PA-C sent at 06/23/2019  7:49 AM EST ----- K improved on daily supp.  Would recheck BMET on 1/15 when she comes to coumadin clinic, to make sure her K trend is normal.   Thank you! Legrand Como [address]" [place], PA-C  06/23/2019 7:49 AM

## 2019-06-27 ENCOUNTER — Other Ambulatory Visit: Payer: Self-pay | Admitting: Hematology & Oncology

## 2019-06-29 ENCOUNTER — Other Ambulatory Visit: Payer: Self-pay | Admitting: Family

## 2019-06-29 MED ORDER — ALBUTEROL SULFATE HFA 108 (90 BASE) MCG/ACT IN AERS: 2.0000 | INHALATION_SPRAY | Freq: Four times a day (QID) | RESPIRATORY_TRACT | 1 refills | Status: DC | PRN

## 2019-06-30 NOTE — Progress Notes (Signed)
Battery check 

## 2019-07-05 ENCOUNTER — Other Ambulatory Visit: Payer: Self-pay | Admitting: Hematology & Oncology

## 2019-07-05 DIAGNOSIS — D3910 Neoplasm of uncertain behavior of unspecified ovary: Secondary | ICD-10-CM

## 2019-07-06 ENCOUNTER — Telehealth: Payer: Self-pay

## 2019-07-06 NOTE — Telephone Encounter (Signed)
The pt called to cancel her coumadin and labs appointment because her mom passed away. She states there is no way she will be able to make the appointments. She do want to reschedule. I told her I will have someone in the Coumadin department to call her to reschedule. She thanked me for my help. She can be reached at [phone].

## 2019-07-09 ENCOUNTER — Other Ambulatory Visit: Payer: BC Managed Care – PPO

## 2019-07-12 ENCOUNTER — Ambulatory Visit (INDEPENDENT_AMBULATORY_CARE_PROVIDER_SITE_OTHER): Payer: BC Managed Care – PPO | Admitting: *Deleted

## 2019-07-12 DIAGNOSIS — Z95810 Presence of automatic (implantable) cardiac defibrillator: Secondary | ICD-10-CM

## 2019-07-12 LAB — CUP PACEART REMOTE DEVICE CHECK
Battery Remaining Longevity: 7 mo
Battery Remaining Percentage: 7 %
Battery Voltage: 2.65 V
Brady Statistic AP VP Percent: 0 %
Brady Statistic AP VS Percent: 1 %
Brady Statistic AS VP Percent: 1 %
Brady Statistic AS VS Percent: 99 %
Brady Statistic RA Percent Paced: 1 %
Brady Statistic RV Percent Paced: 1 %
Date Time Interrogation Session: 20210118055740
HighPow Impedance: 63 Ohm
HighPow Impedance: 63 Ohm
Implantable Lead Implant Date: 20110503
Implantable Lead Implant Date: 20130815
Implantable Lead Location: 753859
Implantable Lead Location: 753860
Implantable Pulse Generator Implant Date: 20110503
Lead Channel Impedance Value: 200 Ohm
Lead Channel Impedance Value: 380 Ohm
Lead Channel Pacing Threshold Amplitude: 0.5 V
Lead Channel Pacing Threshold Amplitude: 2 V
Lead Channel Pacing Threshold Pulse Width: 0.5 ms
Lead Channel Pacing Threshold Pulse Width: 1.3 ms
Lead Channel Sensing Intrinsic Amplitude: 11.4 mV
Lead Channel Sensing Intrinsic Amplitude: 2.4 mV
Lead Channel Setting Pacing Amplitude: 2 V
Lead Channel Setting Pacing Amplitude: 3 V
Lead Channel Setting Pacing Pulse Width: 1.3 ms
Lead Channel Setting Sensing Sensitivity: 0.5 mV
Pulse Gen Serial Number: 778164

## 2019-07-13 NOTE — Progress Notes (Signed)
ICD remote 

## 2019-07-13 NOTE — Progress Notes (Signed)
Cardiology Office Note    Date:  07/14/2019   ID:  [name], DOB [birth year], MRN [id number]  PCP:  Patient, No Pcp Per  Cardiologist: Dr. Tamala Julian  Electrophysiologist:Dr. Lovena Le  TMVR: Dr. Burt Knack  Chief Complaint: 6  Months follow up  History of Present Illness:   [name] is a 59 y.o. female with a history of HLD, hypothyroidism, stage III CKD, ongoing tobacco abuse, recurrent ovarian cancer with multiple intra-abdominal metastases treated with prior surgical resection in 2014 and 2015 with further slow progression, ICM with VT s/p ICD, CAD s/p inferior MI in [id number] complicated by postinfarction papillary muscle rupture and left ventricular aneurysm who underwent urgent repair with mitral valve replacement using a bioprosthetic tissue valve and repair of the left ventricular aneurysm and  prosthetic mitral valve stenosis s/p TMVR (08/2017) who presents to clinic for follow up.   The patient has a history of inferior MI in [id number] complicated by postinfarction papillary muscle rupture and LV aneurysm. She underwent urgent mitral valve replacement with a bioprosthetic tissue valve and repair of LV aneurysm. She was originally treated with a 29 mm Edwards [place] bovine stented pericardial valve. She developed symptoms of progressive combined systolic and diastolic heart failure and was found to have severe bioprosthetic mitral stenosis with a mean gradient of 21 mmHg and reduced LV function with an ejection fraction of 35 to 40%. Because of recurrent ovarian cancer with slow progression, the multidisciplinary heart team recommended transcatheter mitral valve in valve replacement for treatment of her severe mitral stenosis. The patient was treated with a59mm Sapien 3 valve via atrans-apicalapproach on3/26/2019.  Last echo 08/2018 showed LVEF of 40-45%, normal functioning mitral valve with mean gradient of 6 mm Hg.   Here today for follow up. Her K improved on daily  supplement. Due to repeat BMET today. She had chemo last year >> reports in remission. No exercise but remains active. The patient denies nausea, vomiting, fever, chest pain, palpitations, shortness of breath, orthopnea, PND, dizziness, syncope, cough, congestion, abdominal pain, hematochezia, melena, lower extremity edema.  Past Medical History:  Diagnosis Date  . Arthritis    "everywhere"  . Asthma    as a child  . Automatic implantable cardioverter-defibrillator in situ    DR. Beckie Salts   . Bladder cancer [facility]) 2009   "injected medicine to get rid of it"  . Chronic systolic CHF (congestive heart failure) ([place])   . CKD (chronic kidney disease), stage III   . Coronary artery disease    a. large RV/inferior MI complicated by pseudoaneurysm s/p aneurysemectomy and CABG 10/2009 with papillary muscle rupture requiring bioprosthetic mitral valve replacement in [facility].  . Granulosa cell carcinoma of ovary ([place])    recurrent - surgical resection 2007, 2014 and 2016   . H/O thymectomy   . High cholesterol   . Hypothyroidism   . ICD (implantable cardiac defibrillator) in place   . Ischemic cardiomyopathy    a.  ICM and VT arrest 10/2009 s/p AICD (revision 01/2012 due to RV lead problem).  . Myocardial infarction ([place]) 10/14/2009  . Neuropathy    FEET AND HANDS - FROM CHEMO - BUT MUCH IMPROVED  . PAF (paroxysmal atrial fibrillation) ([place])   . Pain    JOINT PAINS AND MUSCLE ACHES ALL OVER.  Marland Kitchen Port-A-Cath in place    RIGHT UPPER CHEST  . Prosthetic valve dysfunction   . S/P mitral valve replacement with bioprosthetic valve 10/21/2009   Edwards Perimount stented  bovine pericardial tissue valve, size 29 mm  . S/P valve-in-valve transcatheter mitral valve replacement 09/16/2017   29 mm Edwards Sapien 3 transcatheter heart valve placed via transapical approach  . SVT (supraventricular tachycardia) ([place])   . Tobacco abuse   . Ventricular tachycardia [facility])     Past Surgical History:   Procedure Laterality Date  . ABDOMINAL HYSTERECTOMY N/A 07/27/2013   Procedure: TUMOR EXCISION OF ABDOMINAL MASS;  Surgeon: Alvino Chapel, MD;  Location: [facility];  Service: Gynecology;  Laterality: N/A;  . APPENDECTOMY  1989  . BILATERAL OOPHORECTOMY  2007  . CARDIAC DEFIBRILLATOR PLACEMENT  02/06/2012   "lead change"  . CARDIAC VALVE REPLACEMENT    . CYSTOSCOPY Right 02/23/2013   Procedure: CYSTOSCOPY WITH STENT REMOVAL;  Surgeon: Alvino Chapel, MD;  Location: [facility];  Service: Gynecology;  Laterality: Right;  . IMPLANTABLE CARDIOVERTER DEFIBRILLATOR REVISION N/A 02/06/2012   Procedure: IMPLANTABLE CARDIOVERTER DEFIBRILLATOR REVISION;  Surgeon: Evans Lance, MD;  Location: [facility] C[facility] LAB;  Service: Cardiovascular;  Laterality: N/A;  . INSERT / REPLACE / REMOVE PACEMAKER  10/2009   DUAL-CHAMBER [place] DEFIBRILLATOR  . IR IMAGING GUIDED PORT INSERTION  05/11/2018  . LAPAROTOMY N/A 01/19/2013   Procedure: TUMOR DEBULKING / BOWEL RESECTION/INSERTION RIGHT UTERERAL DOUBLE J STENT/OMENTECTOMY;  Surgeon: Alvino Chapel, MD;  Location: [facility];  Service: Gynecology;  Laterality: N/A;  . MITRAL VALVE REPLACEMENT  10/21/2009   8mm Edwards Perimount bovine pericardial tissue valve - [facility]  . RIGHT/LEFT HEART CATH AND CORONARY ANGIOGRAPHY N/A 07/02/2017   Procedure: RIGHT/LEFT HEART CATH AND CORONARY ANGIOGRAPHY;  Surgeon: Belva Crome, MD;  Location: [place] CV LAB;  Service: Cardiovascular;  Laterality: N/A;  . TEE WITHOUT CARDIOVERSION N/A 09/16/2017   Procedure: TRANSESOPHAGEAL ECHOCARDIOGRAM (TEE);  Surgeon: [name], MD;  Location: [place];  Service: Open Heart Surgery;  Laterality: N/A;  . THYMECTOMY  2009  . TONSILLECTOMY AND ADENOIDECTOMY  ~ 1967  . TOTAL HIP ARTHROPLASTY  05/06/2012   Procedure: TOTAL HIP ARTHROPLASTY;  Surgeon: Gearlean Alf, MD;  Location: [facility];  Service: Orthopedics;  Laterality: Right;  . TRANSCATHETER MITRAL VALVE  REPLACEMENT, TRANSAPICAL N/A 09/16/2017   Procedure: TRANSCATHETER MITRAL VALVE REPLACEMENT,TRANSAPICAL;  Surgeon: [name], MD;  Location: [place];  Service: Open Heart Surgery;  Laterality: N/A;  . TRANSURETHRAL RESECTION OF BLADDER  2009   "for bladder cancer"  . TUBAL LIGATION  1993  . VAGINAL HYSTERECTOMY  2001    Current Medications: Prior to Admission medications   Medication Sig Start Date End Date Taking? Authorizing Provider  albuterol (VENTOLIN HFA) 108 (90 Base) MCG/ACT inhaler Inhale 2 puffs into the lungs every 6 (six) hours as needed for wheezing or shortness of breath. 06/29/19   [place], Holli Humbles, NP  amoxicillin (AMOXIL) 500 MG tablet Take 2,000 mg by mouth. Take 2,000 mg one hour prior to dental visits.    [provider]  aspirin EC 81 MG tablet Take 81 mg by mouth daily.    [provider]  carvedilol (COREG) 3.125 MG tablet TAKE 1 TABLET (3.125 MG TOTAL) BY MOUTH 2 (TWO) TIMES DAILY WITH A MEAL. 11/02/18   Nani Skillern, PA-C  furosemide (LASIX) 20 MG tablet Take 1 tablet (20 mg total) by mouth daily. 04/07/19   Shirley Friar, PA-C  lidocaine-prilocaine (EMLA) cream Apply 1 application topically as needed. Apply to port per instructions 05/22/18   Volanda Napoleon, MD  LORazepam (ATIVAN) 0.5 MG  tablet TAKE 1 TABLET (0.5 MG TOTAL) BY MOUTH EVERY 6 (SIX) HOURS AS NEEDED. FOR ANXIETY 07/05/19   Volanda Napoleon, MD  methocarbamol (ROBAXIN) 500 MG tablet TAKE 1 TABLET (500 MG TOTAL) BY MOUTH EVERY 6 (SIX) HOURS AS NEEDED FOR MUSCLE SPASMS. 06/28/19   Volanda Napoleon, MD  Potassium Chloride ER 20 MEQ TBCR Take 40 mEq by mouth daily. With extra as needed. Please take 4 tablets ( 80 meq total) ONE TIME ONLY on 06/12/19) 06/12/19   Shirley Friar, PA-C  spironolactone (ALDACTONE) 25 MG tablet Take 1 tablet (25 mg total) by mouth daily. 04/07/19 07/06/19  Shirley Friar, PA-C  traMADol (ULTRAM) 50 MG tablet TAKE 1 TABLET (50 MG  TOTAL) BY MOUTH EVERY 6 (SIX) HOURS AS NEEDED. 07/05/19   Volanda Napoleon, MD  Vitamin D, Ergocalciferol, (DRISDOL) 1.25 MG (50000 UT) CAPS capsule TAKE 1 CAPSULE (50,000 UNITS TOTAL) BY MOUTH EVERY 7 (SEVEN) DAYS. 10/07/18   Volanda Napoleon, MD  warfarin (COUMADIN) 7.5 MG tablet Take 1 tablet daily except 1/2 tablet on Mondays or as directed by Anticoagulation Clinic. 05/19/19   Belva Crome, MD  zolpidem (AMBIEN) 5 MG tablet TAKE 1 TABLET BY MOUTH EVERYDAY AT BEDTIME 05/23/19   Volanda Napoleon, MD    Allergies:   Prochlorperazine edisylate and Adhesive [tape]   Social History   Socioeconomic History  . Marital status: Divorced    Spouse name: Not on file  . Number of children: Not on file  . Years of education: Not on file  . Highest education level: Not on file  Occupational History    Comment: WORKS FULL TIME  Tobacco Use  . Smoking status: Current Some Day Smoker    Packs/day: 0.50    Years: 30.00    Pack years: 15.00    Types: Cigarettes    Start date: 03/28/1974  . Smokeless tobacco: Never Used  Substance and Sexual Activity  . Alcohol use: No    Alcohol/week: 0.0 standard drinks  . Drug use: No  . Sexual activity: Not on file  Other Topics Concern  . Not on file  Social History Narrative   WORKS FULL TIME   SINGLE   TOBACCO USE-YES   IMPLANTATION OF DUAL-CHAMBER [place]   Social Determinants of Health   Financial Resource Strain:   . Difficulty of Paying Living Expenses: Not on file  Food Insecurity:   . Worried About Charity fundraiser in the Last Year: Not on file  . Ran Out of Food in the Last Year: Not on file  Transportation Needs:   . Lack of Transportation (Medical): Not on file  . Lack of Transportation (Non-Medical): Not on file  Physical Activity:   . Days of Exercise per Week: Not on file  . Minutes of Exercise per Session: Not on file  Stress:   . Feeling of Stress : Not on file  Social Connections:   . Frequency of  Communication with Friends and Family: Not on file  . Frequency of Social Gatherings with Friends and Family: Not on file  . Attends Religious Services: Not on file  . Active Member of Clubs or Organizations: Not on file  . Attends Archivist Meetings: Not on file  . Marital Status: Not on file     Family History:  The patient's family history includes Heart attack in her brother, maternal grandfather, and paternal grandfather; Stroke in her maternal grandmother.  ROS:  Please see the history of present illness.    ROS All other systems reviewed and are negative.   PHYSICAL EXAM:   VS:  BP 110/70   Pulse 81   Ht 5\' 6"  (1.676 m)   Wt 183 lb 12.8 oz (83.4 kg)   SpO2 99%   BMI 29.67 kg/m    GEN: Well nourished, well developed, in no acute distress  HEENT: normal  Neck: no JVD, carotid bruits, or masses Cardiac: RRR; no murmurs, rubs, or gallops,no edema  Respiratory:  clear to auscultation bilaterally, normal work of breathing GI: soft, nontender, nondistended, + BS MS: no deformity or atrophy  Skin: warm and dry, no rash Neuro:  Alert and Oriented x 3, Strength and sensation are intact Psych: euthymic mood, full affect  Wt Readings from Last 3 Encounters:  07/14/19 183 lb 12.8 oz (83.4 kg)  05/14/19 179 lb 12 oz (81.5 kg)  04/07/19 183 lb (83 kg)      Studies/Labs Reviewed:   EKG:  EKG is not ordered today.   Recent Labs: 05/14/2019: ALT 13; Hemoglobin 12.5; Platelet Count 225 06/22/2019: BUN 21; Creatinine, Ser 1.29; Potassium 4.3; Sodium 138   Lipid Panel    Component Value Date/Time   CHOL 247 (H) 09/18/2017 0240   TRIG 144 09/18/2017 0240   HDL 37 (L) 09/18/2017 0240   CHOLHDL 6.7 09/18/2017 0240   VLDL 29 09/18/2017 0240   LDLCALC 181 (H) 09/18/2017 0240    Additional studies/ records that were reviewed today include:   Echocardiogram: 08/2018 1. The left ventricle has mild-moderately reduced systolic function, with an ejection fraction of  40-45%. The cavity size was mildly dilated. Left ventricular diastolic Doppler parameters are indeterminate.  2. Septal hypokinesis inferior basal aneurysm.  3. The right ventricle has normal systolic function. The cavity was normal. There is no increase in right ventricular wall thickness.  4. Pacing wires in RA/RV.  5. Left atrial size was moderately dilated.  6. Post bioprosthetic MVR and subsequent valve in vlave replacement No significant MR and stabel mean gradient 6 mmHg.  7. The tricuspid valve is normal in structure.  8. The aortic valve is tricuspid Moderate thickening of the aortic valve Moderate calcification of the aortic valve.  9. The pulmonic valve was grossly normal. Pulmonic valve regurgitation is mild by color flow Doppler. 10. The aortic root is normal in size and structure.    ASSESSMENT & PLAN:   1. Chronic systolic CHF - Euvolemic. Continue Coreg, lasix and spironolactone. Recently started on supplement for borderline low potassium. Due for lab today.   2. PAF - Sinus by exam. On Coumadin for anticoagulation.   3. Severe bioprosthetic mitral valve stenosis s/p TMVR Followed by Dr. Burt Knack.  Last echo 08/2018 showed  normal functioning mitral valve with mean gradient of 6 mm Hg.   4. S/p ICD - Followed by Dr. Lovena Le   Medication Adjustments/Labs and Tests Ordered: Current medicines are reviewed at length with the patient today.  Concerns regarding medicines are outlined above.  Medication changes, Labs and Tests ordered today are listed in the Patient Instructions below. Patient Instructions  Medication Instructions:  Your physician recommends that you continue on your current medications as directed. Please refer to the Current Medication list given to you today.  *If you need a refill on your cardiac medications before your next appointment, please call your pharmacy*  Lab Work: TODAY:  BMET (previously ordered before today)  If you have labs (blood work)  drawn today and your tests are completely normal, you will receive your results only by: Marland Kitchen MyChart Message (if you have MyChart) OR . A paper copy in the mail If you have any lab test that is abnormal or we need to change your treatment, we will call you to review the results.  Testing/Procedures: None ordered  Follow-Up: At [facility], you and your health needs are our priority.  As part of our continuing mission to provide you with exceptional heart care, we have created designated Provider Care Teams.  These Care Teams include your primary Cardiologist (physician) and Advanced Practice Providers (APPs -  Physician Assistants and Nurse Practitioners) who all work together to provide you with the care you need, when you need it.  Your next appointment:   6 month(s)  The format for your next appointment:   In Person  Provider:   You may see Sinclair Grooms, MD or one of the following Advanced Practice Providers on your designated Care Team:    Truitt Merle, NP  Cecilie Kicks, NP  Kathyrn Drown, NP   Other Instructions      Signed, Leanor Kail, [place]  07/14/2019 12:26 PM    [address], [place], [place]  [address] Phone: [phone]; Fax: [phone]

## 2019-07-14 ENCOUNTER — Encounter: Payer: Self-pay | Admitting: Physician Assistant

## 2019-07-14 ENCOUNTER — Ambulatory Visit (INDEPENDENT_AMBULATORY_CARE_PROVIDER_SITE_OTHER): Payer: BC Managed Care – PPO | Admitting: Physician Assistant

## 2019-07-14 ENCOUNTER — Ambulatory Visit (INDEPENDENT_AMBULATORY_CARE_PROVIDER_SITE_OTHER): Payer: BC Managed Care – PPO

## 2019-07-14 ENCOUNTER — Other Ambulatory Visit: Payer: Self-pay

## 2019-07-14 VITALS — BP 110/70 | HR 81 | Ht 66.0 in | Wt 183.8 lb

## 2019-07-14 DIAGNOSIS — Z952 Presence of prosthetic heart valve: Secondary | ICD-10-CM

## 2019-07-14 DIAGNOSIS — I480 Paroxysmal atrial fibrillation: Secondary | ICD-10-CM | POA: Diagnosis not present

## 2019-07-14 DIAGNOSIS — E876 Hypokalemia: Secondary | ICD-10-CM

## 2019-07-14 DIAGNOSIS — Z953 Presence of xenogenic heart valve: Secondary | ICD-10-CM

## 2019-07-14 DIAGNOSIS — Z95810 Presence of automatic (implantable) cardiac defibrillator: Secondary | ICD-10-CM

## 2019-07-14 DIAGNOSIS — I5022 Chronic systolic (congestive) heart failure: Secondary | ICD-10-CM | POA: Diagnosis not present

## 2019-07-14 LAB — POCT INR: INR: 2.6 (ref 2.0–3.0)

## 2019-07-14 NOTE — Patient Instructions (Signed)
Description   Continue taking 1 tablet (7.5mg ) daily except 1/2 tablet (3.75mg ) on Mondays.  Recheck INR in 6 weeks. Continue normal dark green consistency.  Coumadin Clinic [phone] Main [phone]

## 2019-07-14 NOTE — Patient Instructions (Signed)
Medication Instructions:  Your physician recommends that you continue on your current medications as directed. Please refer to the Current Medication list given to you today.  *If you need a refill on your cardiac medications before your next appointment, please call your pharmacy*  Lab Work: TODAY:  BMET (previously ordered before today)  If you have labs (blood work) drawn today and your tests are completely normal, you will receive your results only by: Marland Kitchen MyChart Message (if you have MyChart) OR . A paper copy in the mail If you have any lab test that is abnormal or we need to change your treatment, we will call you to review the results.  Testing/Procedures: None ordered  Follow-Up: At [facility], you and your health needs are our priority.  As part of our continuing mission to provide you with exceptional heart care, we have created designated Provider Care Teams.  These Care Teams include your primary Cardiologist (physician) and Advanced Practice Providers (APPs -  Physician Assistants and Nurse Practitioners) who all work together to provide you with the care you need, when you need it.  Your next appointment:   6 month(s)  The format for your next appointment:   In Person  Provider:   You may see Sinclair Grooms, MD or one of the following Advanced Practice Providers on your designated Care Team:    Truitt Merle, NP  Cecilie Kicks, NP  Kathyrn Drown, NP   Other Instructions

## 2019-07-15 LAB — BASIC METABOLIC PANEL
BUN/Creatinine Ratio: 11 (ref 9–23)
BUN: 12 mg/dL (ref 6–24)
CO2: 23 mmol/L (ref 20–29)
Calcium: 9.2 mg/dL (ref 8.7–10.2)
Chloride: 105 mmol/L (ref 96–106)
Creatinine, Ser: 1.12 mg/dL — ABNORMAL HIGH (ref 0.57–1.00)
GFR calc Af Amer: 62 mL/min/{1.73_m2} (ref >59)
GFR calc non Af Amer: 54 mL/min/{1.73_m2} — ABNORMAL LOW (ref >59)
Glucose: 76 mg/dL (ref 65–99)
Potassium: 4 mmol/L (ref 3.5–5.2)
Sodium: 141 mmol/L (ref 134–144)

## 2019-07-21 ENCOUNTER — Inpatient Hospital Stay: Payer: BC Managed Care – PPO | Attending: Hematology & Oncology

## 2019-07-21 ENCOUNTER — Other Ambulatory Visit: Payer: Self-pay

## 2019-07-21 DIAGNOSIS — Z452 Encounter for adjustment and management of vascular access device: Secondary | ICD-10-CM | POA: Insufficient documentation

## 2019-07-21 DIAGNOSIS — D3910 Neoplasm of uncertain behavior of unspecified ovary: Secondary | ICD-10-CM | POA: Insufficient documentation

## 2019-07-21 NOTE — Patient Instructions (Signed)

## 2019-08-01 ENCOUNTER — Other Ambulatory Visit: Payer: Self-pay | Admitting: Hematology & Oncology

## 2019-08-01 DIAGNOSIS — D3910 Neoplasm of uncertain behavior of unspecified ovary: Secondary | ICD-10-CM

## 2019-08-12 ENCOUNTER — Ambulatory Visit (INDEPENDENT_AMBULATORY_CARE_PROVIDER_SITE_OTHER): Payer: BC Managed Care – PPO | Admitting: *Deleted

## 2019-08-12 DIAGNOSIS — Z95810 Presence of automatic (implantable) cardiac defibrillator: Secondary | ICD-10-CM

## 2019-08-12 LAB — CUP PACEART REMOTE DEVICE CHECK
Battery Remaining Longevity: 7 mo
Battery Remaining Percentage: 7 %
Battery Voltage: 2.65 V
Brady Statistic AP VP Percent: 0 %
Brady Statistic AP VS Percent: 1 %
Brady Statistic AS VP Percent: 1 %
Brady Statistic AS VS Percent: 99 %
Brady Statistic RA Percent Paced: 1 %
Brady Statistic RV Percent Paced: 1 %
Date Time Interrogation Session: 20210218041122
HighPow Impedance: 64 Ohm
HighPow Impedance: 64 Ohm
Implantable Lead Implant Date: 20110503
Implantable Lead Implant Date: 20130815
Implantable Lead Location: 753859
Implantable Lead Location: 753860
Implantable Pulse Generator Implant Date: 20110503
Lead Channel Impedance Value: 230 Ohm
Lead Channel Impedance Value: 430 Ohm
Lead Channel Pacing Threshold Amplitude: 0.5 V
Lead Channel Pacing Threshold Amplitude: 2 V
Lead Channel Pacing Threshold Pulse Width: 0.5 ms
Lead Channel Pacing Threshold Pulse Width: 1.3 ms
Lead Channel Sensing Intrinsic Amplitude: 12 mV
Lead Channel Sensing Intrinsic Amplitude: 2.6 mV
Lead Channel Setting Pacing Amplitude: 2 V
Lead Channel Setting Pacing Amplitude: 3 V
Lead Channel Setting Pacing Pulse Width: 1.3 ms
Lead Channel Setting Sensing Sensitivity: 0.5 mV
Pulse Gen Serial Number: 778164

## 2019-08-12 NOTE — Addendum Note (Signed)
Addended by: Jennette Banker on: 08/12/2019 12:34 PM   Modules accepted: Level of Service

## 2019-08-12 NOTE — Progress Notes (Signed)
ICD Remote  

## 2019-08-13 ENCOUNTER — Other Ambulatory Visit: Payer: Self-pay | Admitting: Hematology & Oncology

## 2019-08-19 ENCOUNTER — Other Ambulatory Visit: Payer: Self-pay | Admitting: Hematology & Oncology

## 2019-08-21 ENCOUNTER — Other Ambulatory Visit: Payer: Self-pay | Admitting: Hematology & Oncology

## 2019-08-26 ENCOUNTER — Other Ambulatory Visit: Payer: Self-pay | Admitting: Hematology & Oncology

## 2019-08-26 DIAGNOSIS — D3910 Neoplasm of uncertain behavior of unspecified ovary: Secondary | ICD-10-CM

## 2019-09-01 ENCOUNTER — Other Ambulatory Visit: Payer: Self-pay

## 2019-09-01 ENCOUNTER — Ambulatory Visit (INDEPENDENT_AMBULATORY_CARE_PROVIDER_SITE_OTHER): Payer: BC Managed Care – PPO | Admitting: *Deleted

## 2019-09-01 DIAGNOSIS — I480 Paroxysmal atrial fibrillation: Secondary | ICD-10-CM | POA: Diagnosis not present

## 2019-09-01 DIAGNOSIS — Z952 Presence of prosthetic heart valve: Secondary | ICD-10-CM | POA: Diagnosis not present

## 2019-09-01 DIAGNOSIS — D3910 Neoplasm of uncertain behavior of unspecified ovary: Secondary | ICD-10-CM

## 2019-09-01 DIAGNOSIS — Z953 Presence of xenogenic heart valve: Secondary | ICD-10-CM

## 2019-09-01 LAB — POCT INR: INR: 2.3 (ref 2.0–3.0)

## 2019-09-01 MED ORDER — WARFARIN SODIUM 7.5 MG PO TABS: ORAL_TABLET | ORAL | 0 refills | Status: DC

## 2019-09-01 NOTE — Patient Instructions (Signed)
Description   Continue taking 1 tablet (7.5mg ) daily except 1/2 tablet (3.75mg ) on Mondays.  Recheck INR in 6 weeks. Continue normal dark green consistency.  Coumadin Clinic [phone] Main [phone]

## 2019-09-13 ENCOUNTER — Ambulatory Visit (INDEPENDENT_AMBULATORY_CARE_PROVIDER_SITE_OTHER): Payer: BC Managed Care – PPO | Admitting: *Deleted

## 2019-09-13 DIAGNOSIS — Z95810 Presence of automatic (implantable) cardiac defibrillator: Secondary | ICD-10-CM

## 2019-09-14 ENCOUNTER — Inpatient Hospital Stay: Payer: BC Managed Care – PPO | Attending: Hematology & Oncology

## 2019-09-14 ENCOUNTER — Other Ambulatory Visit: Payer: Self-pay

## 2019-09-14 ENCOUNTER — Inpatient Hospital Stay (HOSPITAL_BASED_OUTPATIENT_CLINIC_OR_DEPARTMENT_OTHER): Payer: BC Managed Care – PPO | Admitting: Family

## 2019-09-14 ENCOUNTER — Encounter: Payer: Self-pay | Admitting: Family

## 2019-09-14 ENCOUNTER — Inpatient Hospital Stay: Payer: BC Managed Care – PPO

## 2019-09-14 VITALS — BP 111/70 | HR 8 | Temp 97.1°F | Resp 17 | Ht 66.0 in | Wt 180.1 lb

## 2019-09-14 DIAGNOSIS — Z8543 Personal history of malignant neoplasm of ovary: Secondary | ICD-10-CM | POA: Insufficient documentation

## 2019-09-14 DIAGNOSIS — Z452 Encounter for adjustment and management of vascular access device: Secondary | ICD-10-CM | POA: Diagnosis not present

## 2019-09-14 DIAGNOSIS — Z79899 Other long term (current) drug therapy: Secondary | ICD-10-CM | POA: Diagnosis not present

## 2019-09-14 DIAGNOSIS — D3910 Neoplasm of uncertain behavior of unspecified ovary: Secondary | ICD-10-CM

## 2019-09-14 DIAGNOSIS — Z9221 Personal history of antineoplastic chemotherapy: Secondary | ICD-10-CM | POA: Insufficient documentation

## 2019-09-14 DIAGNOSIS — Z7901 Long term (current) use of anticoagulants: Secondary | ICD-10-CM | POA: Diagnosis not present

## 2019-09-14 DIAGNOSIS — Z7982 Long term (current) use of aspirin: Secondary | ICD-10-CM | POA: Diagnosis not present

## 2019-09-14 LAB — CBC WITH DIFFERENTIAL (CANCER CENTER ONLY)
Abs Immature Granulocytes: 0.02 10*3/uL (ref 0.00–0.07)
Basophils Absolute: 0 10*3/uL (ref 0.0–0.1)
Basophils Relative: 1 %
Eosinophils Absolute: 0.1 10*3/uL (ref 0.0–0.5)
Eosinophils Relative: 2 %
HCT: 39 % (ref 36.0–46.0)
Hemoglobin: 13 g/dL (ref 12.0–15.0)
Immature Granulocytes: 0 %
Lymphocytes Relative: 10 %
Lymphs Abs: 0.7 10*3/uL (ref 0.7–4.0)
MCH: 31.2 pg (ref 26.0–34.0)
MCHC: 33.3 g/dL (ref 30.0–36.0)
MCV: 93.5 fL (ref 80.0–100.0)
Monocytes Absolute: 0.7 10*3/uL (ref 0.1–1.0)
Monocytes Relative: 11 %
Neutro Abs: 5 10*3/uL (ref 1.7–7.7)
Neutrophils Relative %: 76 %
Platelet Count: 157 10*3/uL (ref 150–400)
RBC: 4.17 MIL/uL (ref 3.87–5.11)
RDW: 13.2 % (ref 11.5–15.5)
WBC Count: 6.5 10*3/uL (ref 4.0–10.5)
nRBC: 0 % (ref 0.0–0.2)

## 2019-09-14 LAB — CUP PACEART REMOTE DEVICE CHECK
Battery Remaining Longevity: 6 mo
Battery Remaining Percentage: 6 %
Battery Voltage: 2.65 V
Brady Statistic AP VP Percent: 0 %
Brady Statistic AP VS Percent: 1 %
Brady Statistic AS VP Percent: 1 %
Brady Statistic AS VS Percent: 99 %
Brady Statistic RA Percent Paced: 1 %
Brady Statistic RV Percent Paced: 1 %
Date Time Interrogation Session: 20210322172205
HighPow Impedance: 62 Ohm
HighPow Impedance: 62 Ohm
Implantable Lead Implant Date: 20110503
Implantable Lead Implant Date: 20130815
Implantable Lead Location: 753859
Implantable Lead Location: 753860
Implantable Pulse Generator Implant Date: 20110503
Lead Channel Impedance Value: 230 Ohm
Lead Channel Impedance Value: 380 Ohm
Lead Channel Pacing Threshold Amplitude: 0.5 V
Lead Channel Pacing Threshold Amplitude: 2 V
Lead Channel Pacing Threshold Pulse Width: 0.5 ms
Lead Channel Pacing Threshold Pulse Width: 1.3 ms
Lead Channel Sensing Intrinsic Amplitude: 11.4 mV
Lead Channel Sensing Intrinsic Amplitude: 2 mV
Lead Channel Setting Pacing Amplitude: 2 V
Lead Channel Setting Pacing Amplitude: 3 V
Lead Channel Setting Pacing Pulse Width: 1.3 ms
Lead Channel Setting Sensing Sensitivity: 0.5 mV
Pulse Gen Serial Number: 778164

## 2019-09-14 LAB — LACTATE DEHYDROGENASE: LDH: 200 U/L — ABNORMAL HIGH (ref 98–192)

## 2019-09-14 LAB — CMP (CANCER CENTER ONLY)
ALT: 11 U/L (ref 0–44)
AST: 14 U/L — ABNORMAL LOW (ref 15–41)
Albumin: 4 g/dL (ref 3.5–5.0)
Alkaline Phosphatase: 58 U/L (ref 38–126)
Anion gap: 9 (ref 5–15)
BUN: 17 mg/dL (ref 6–20)
CO2: 29 mmol/L (ref 22–32)
Calcium: 9.1 mg/dL (ref 8.9–10.3)
Chloride: 103 mmol/L (ref 98–111)
Creatinine: 1.15 mg/dL — ABNORMAL HIGH (ref 0.44–1.00)
GFR, Est AFR Am: 60 mL/min — ABNORMAL LOW (ref >60)
GFR, Estimated: 52 mL/min — ABNORMAL LOW (ref >60)
Glucose, Bld: 92 mg/dL (ref 70–99)
Potassium: 3.6 mmol/L (ref 3.5–5.1)
Sodium: 141 mmol/L (ref 135–145)
Total Bilirubin: 0.4 mg/dL (ref 0.3–1.2)
Total Protein: 6.4 g/dL — ABNORMAL LOW (ref 6.5–8.1)

## 2019-09-14 MED ORDER — ZOLPIDEM TARTRATE 5 MG PO TABS: ORAL_TABLET | ORAL | 0 refills | Status: DC

## 2019-09-14 NOTE — Addendum Note (Signed)
Addended by: Jennette Banker on: 09/14/2019 09:06 AM   Modules accepted: Level of Service

## 2019-09-14 NOTE — Progress Notes (Signed)
Hematology and Oncology Follow Up Visit  [name] [id number] [birth year] 59 y.o. 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 She is doing well and has no complaints at this time. Both [name] and Ritter passed away recently which has been tough.  She has generalized joint and lower back aches and pains.  Inhibin B level was stable at 7.3 in November 2020. Today's result is pending.  She has had no fever, chills, n/v, cough, rash, dizziness, SOB, chest pain, abdominal pain/bloating or changes in bowel or bladder habits.  She is doing well on Coumadin and states that Stacy INR is therapeutic. She has had no issues with bleeding. No bruising or petechiae.  No swelling, numbness or tingling in Stacy extremities.  No falls or syncopal episodes to report.  She has maintained a good appetite and is staying well hydrated. Stacy weight is stable.  She is staying busy working for the [employer] home.   ECOG Performance Status: 1 - Symptomatic but completely ambulatory  Medications:  Allergies as of 09/14/2019      Reactions   Prochlorperazine Edisylate Other (See Comments)   Nervous/ flutter/ shakes   Adhesive [tape] Hives   Redness/hives from adhesive tape( tolerates latex gloves); tolerates paper tape      Medication List       Accurate as of September 14, 2019  9:32 AM. If you have any questions, ask your nurse or doctor.        albuterol 108 (90 Base) MCG/ACT inhaler Commonly known as: VENTOLIN HFA Inhale 2 puffs into the lungs every 6 (six) hours as needed for wheezing or shortness of breath.   amoxicillin 500 MG tablet Commonly known as: AMOXIL Take 2,000 mg by mouth. Take 2,000 mg one hour prior to dental visits.   aspirin EC 81 MG tablet Take 81 mg by mouth daily.   carvedilol 3.125 MG tablet Commonly known as: COREG TAKE 1 TABLET (3.125 MG TOTAL) BY MOUTH 2 (TWO) TIMES DAILY WITH A MEAL.   furosemide 20 MG tablet Commonly known as: LASIX Take 1 tablet (20 mg total) by mouth daily.   lidocaine-prilocaine cream Commonly known as: EMLA Apply 1 application topically as needed. Apply to port per instructions   LORazepam 0.5 MG tablet Commonly known as: ATIVAN TAKE 1 TABLET (0.5 MG TOTAL) BY MOUTH EVERY 6 (SIX) HOURS AS NEEDED. FOR ANXIETY   methocarbamol 500 MG tablet Commonly known as: ROBAXIN TAKE 1 TABLET (500 MG TOTAL) BY MOUTH EVERY 6 (SIX) HOURS AS NEEDED FOR MUSCLE SPASMS.   Potassium Chloride ER 20 MEQ Tbcr Take 40 mEq by mouth daily. With extra as needed. Please take 4 tablets ( 80 meq total) ONE TIME ONLY on 06/12/19)   spironolactone 25 MG tablet Commonly known as: ALDACTONE Take 1 tablet (25 mg total) by mouth daily.   traMADol 50 MG tablet Commonly known as: ULTRAM TAKE 1 TABLET (50 MG TOTAL) BY MOUTH EVERY 6 (SIX) HOURS AS NEEDED.   Vitamin D (Ergocalciferol) 1.25 MG (50000 UNIT) Caps capsule Commonly known as: DRISDOL TAKE 1 CAPSULE (50,000 UNITS TOTAL) BY MOUTH EVERY 7 (SEVEN) DAYS.   warfarin 7.5 MG tablet Commonly known as: COUMADIN Take as directed by the anticoagulation clinic. If you are unsure how to take this medication, talk to  your nurse or doctor. Original instructions: Take 1 tablet daily except 1/2 tablet on Mondays or as directed by Anticoagulation Clinic.   zolpidem 5 MG tablet Commonly known as: AMBIEN TAKE 1 TABLET BY MOUTH EVERYDAY AT BEDTIME       Allergies:  Allergies  Allergen Reactions  . Prochlorperazine Edisylate Other (See Comments)    Nervous/ flutter/ shakes  . Adhesive [Tape] Hives    Redness/hives from adhesive tape( tolerates latex gloves); tolerates paper tape    Past Medical History, Surgical history, Social history, and Family History were reviewed and updated.  Review of  Systems: All other 10 point review of systems is negative.   Physical Exam:  vitals were not taken for this visit.   Wt Readings from Last 3 Encounters:  09/14/19 180 lb 1.9 oz (81.7 kg)  07/14/19 183 lb 12.8 oz (83.4 kg)  05/14/19 179 lb 12 oz (81.5 kg)    Ocular: Sclerae unicteric, pupils equal, round and reactive to light Ear-nose-throat: Oropharynx clear, dentition fair Lymphatic: No cervical or supraclavicular adenopathy Lungs no rales or rhonchi, good excursion bilaterally Heart regular rate and rhythm, no murmur appreciated Abd soft, nontender, positive bowel sounds, no liver or spleen tip palpated on exam, no fluid wave  MSK no focal spinal tenderness, no joint edema Neuro: non-focal, well-oriented, appropriate affect Breasts: Deferred   Lab Results  Component Value Date   WBC 6.7 05/14/2019   HGB 12.5 05/14/2019   HCT 37.0 05/14/2019   MCV 93.9 05/14/2019   PLT 225 05/14/2019   Lab Results  Component Value Date   FERRITIN 181 07/24/2018   IRON 69 07/24/2018   TIBC 286 07/24/2018   UIBC 217 07/24/2018   IRONPCTSAT 24 07/24/2018   Lab Results  Component Value Date   RETICCTPCT 3.1 07/24/2018   RBC 3.94 05/14/2019   No results found for: KPAFRELGTCHN, LAMBDASER, KAPLAMBRATIO No results found for: IGGSERUM, IGA, IGMSERUM No results found for: Odetta Pink, SPEI   Chemistry      Component Value Date/Time   NA 141 07/14/2019 1223   NA 140 03/15/2016 0953   K 4.0 07/14/2019 1223   K 3.9 03/15/2016 0953   CL 105 07/14/2019 1223   CL 102 12/13/2015 0803   CO2 23 07/14/2019 1223   CO2 19 (L) 03/15/2016 0953   BUN 12 07/14/2019 1223   BUN 15.9 03/15/2016 0953   CREATININE 1.12 (H) 07/14/2019 1223   CREATININE 1.36 (H) 05/14/2019 0920   CREATININE 1.4 (H) 03/15/2016 0953      Component Value Date/Time   CALCIUM 9.2 07/14/2019 1223   CALCIUM 9.3 03/15/2016 0953   ALKPHOS 60 05/14/2019 0920   ALKPHOS  55 03/15/2016 0953   AST 15 05/14/2019 0920   AST 17 03/15/2016 0953   ALT 13 05/14/2019 0920   ALT 23 03/15/2016 0953   BILITOT 0.4 05/14/2019 0920   BILITOT 0.38 03/15/2016 0953       Impression and Plan: [name] is a very pleasant 59 yo caucasian female with history of recurrent granulosa cell tumor. She is doing well at this time and has no complaints.  So far, there has been no evidence of new recurrence.  Inhibin B is pending.  No scans needed at this time unless she becomes symptomatic.  We will plan to see Stacy again in 4 months.  She will contact our office with any questions or concerns. We can certainly see Stacy sooner if needed.  Laverna Peace, NP 3/23/20219:32 AM

## 2019-09-14 NOTE — Patient Instructions (Signed)

## 2019-09-15 ENCOUNTER — Other Ambulatory Visit: Payer: Self-pay | Admitting: Hematology & Oncology

## 2019-09-16 LAB — INHIBIN B: Inhibin B: 8.8 pg/mL (ref 0.0–16.9)

## 2019-09-17 ENCOUNTER — Encounter: Payer: Self-pay | Admitting: *Deleted

## 2019-09-21 ENCOUNTER — Other Ambulatory Visit: Payer: Self-pay | Admitting: Hematology & Oncology

## 2019-09-21 DIAGNOSIS — D3910 Neoplasm of uncertain behavior of unspecified ovary: Secondary | ICD-10-CM

## 2019-09-28 ENCOUNTER — Other Ambulatory Visit: Payer: Self-pay | Admitting: Student

## 2019-09-28 DIAGNOSIS — I5022 Chronic systolic (congestive) heart failure: Secondary | ICD-10-CM

## 2019-10-02 ENCOUNTER — Other Ambulatory Visit: Payer: Self-pay | Admitting: Hematology & Oncology

## 2019-10-09 ENCOUNTER — Other Ambulatory Visit: Payer: Self-pay | Admitting: Physician Assistant

## 2019-10-13 ENCOUNTER — Ambulatory Visit (INDEPENDENT_AMBULATORY_CARE_PROVIDER_SITE_OTHER): Payer: 59 | Admitting: *Deleted

## 2019-10-13 ENCOUNTER — Other Ambulatory Visit: Payer: Self-pay | Admitting: Hematology & Oncology

## 2019-10-13 ENCOUNTER — Other Ambulatory Visit: Payer: Self-pay

## 2019-10-13 DIAGNOSIS — I480 Paroxysmal atrial fibrillation: Secondary | ICD-10-CM

## 2019-10-13 DIAGNOSIS — Z952 Presence of prosthetic heart valve: Secondary | ICD-10-CM

## 2019-10-13 DIAGNOSIS — Z5181 Encounter for therapeutic drug level monitoring: Secondary | ICD-10-CM | POA: Diagnosis not present

## 2019-10-13 DIAGNOSIS — Z953 Presence of xenogenic heart valve: Secondary | ICD-10-CM | POA: Diagnosis not present

## 2019-10-13 LAB — POCT INR: INR: 3.2 — AB (ref 2.0–3.0)

## 2019-10-13 NOTE — Patient Instructions (Signed)
Description   Today take 1/2 tablet then continue taking 1 tablet (7.5mg ) daily except 1/2 tablet (3.75mg ) on Mondays.  Recheck INR in 5 weeks. Continue normal dark green consistency.  Coumadin Clinic [phone] Main [phone]

## 2019-10-14 ENCOUNTER — Ambulatory Visit (INDEPENDENT_AMBULATORY_CARE_PROVIDER_SITE_OTHER): Payer: 59 | Admitting: *Deleted

## 2019-10-14 DIAGNOSIS — Z95810 Presence of automatic (implantable) cardiac defibrillator: Secondary | ICD-10-CM | POA: Diagnosis not present

## 2019-10-14 LAB — CUP PACEART REMOTE DEVICE CHECK
Battery Remaining Longevity: 6 mo
Battery Remaining Percentage: 6 %
Battery Voltage: 2.63 V
Brady Statistic AP VP Percent: 0 %
Brady Statistic AP VS Percent: 1 %
Brady Statistic AS VP Percent: 1 %
Brady Statistic AS VS Percent: 99 %
Brady Statistic RA Percent Paced: 1 %
Brady Statistic RV Percent Paced: 1 %
Date Time Interrogation Session: 20210422040008
HighPow Impedance: 69 Ohm
HighPow Impedance: 69 Ohm
Implantable Lead Implant Date: 20110503
Implantable Lead Implant Date: 20130815
Implantable Lead Location: 753859
Implantable Lead Location: 753860
Implantable Pulse Generator Implant Date: 20110503
Lead Channel Impedance Value: 250 Ohm
Lead Channel Impedance Value: 430 Ohm
Lead Channel Pacing Threshold Amplitude: 0.5 V
Lead Channel Pacing Threshold Amplitude: 2 V
Lead Channel Pacing Threshold Pulse Width: 0.5 ms
Lead Channel Pacing Threshold Pulse Width: 1.3 ms
Lead Channel Sensing Intrinsic Amplitude: 11.4 mV
Lead Channel Sensing Intrinsic Amplitude: 2.6 mV
Lead Channel Setting Pacing Amplitude: 2 V
Lead Channel Setting Pacing Amplitude: 3 V
Lead Channel Setting Pacing Pulse Width: 1.3 ms
Lead Channel Setting Sensing Sensitivity: 0.5 mV
Pulse Gen Serial Number: 778164

## 2019-10-15 NOTE — Progress Notes (Signed)
ICD Remote  

## 2019-10-19 ENCOUNTER — Other Ambulatory Visit: Payer: Self-pay | Admitting: *Deleted

## 2019-10-19 DIAGNOSIS — D3910 Neoplasm of uncertain behavior of unspecified ovary: Secondary | ICD-10-CM

## 2019-10-20 ENCOUNTER — Other Ambulatory Visit: Payer: Self-pay | Admitting: Hematology & Oncology

## 2019-10-20 ENCOUNTER — Other Ambulatory Visit: Payer: Self-pay | Admitting: *Deleted

## 2019-10-20 ENCOUNTER — Other Ambulatory Visit: Payer: Self-pay | Admitting: Physician Assistant

## 2019-10-20 DIAGNOSIS — D3910 Neoplasm of uncertain behavior of unspecified ovary: Secondary | ICD-10-CM

## 2019-10-20 MED ORDER — ZOLPIDEM TARTRATE 5 MG PO TABS: ORAL_TABLET | ORAL | 0 refills | Status: DC

## 2019-11-08 ENCOUNTER — Other Ambulatory Visit: Payer: Self-pay | Admitting: Hematology & Oncology

## 2019-11-14 ENCOUNTER — Other Ambulatory Visit: Payer: Self-pay | Admitting: Hematology & Oncology

## 2019-11-15 ENCOUNTER — Ambulatory Visit (INDEPENDENT_AMBULATORY_CARE_PROVIDER_SITE_OTHER): Payer: BC Managed Care – PPO | Admitting: *Deleted

## 2019-11-15 DIAGNOSIS — I472 Ventricular tachycardia: Secondary | ICD-10-CM

## 2019-11-15 DIAGNOSIS — I4729 Other ventricular tachycardia: Secondary | ICD-10-CM

## 2019-11-15 LAB — CUP PACEART REMOTE DEVICE CHECK
Battery Remaining Longevity: 3 mo
Battery Remaining Percentage: 4 %
Battery Voltage: 2.62 V
Brady Statistic AP VP Percent: 0 %
Brady Statistic AP VS Percent: 1 %
Brady Statistic AS VP Percent: 1 %
Brady Statistic AS VS Percent: 99 %
Brady Statistic RA Percent Paced: 1 %
Brady Statistic RV Percent Paced: 1 %
Date Time Interrogation Session: 20210523054228
HighPow Impedance: 68 Ohm
HighPow Impedance: 68 Ohm
Implantable Lead Implant Date: 20110503
Implantable Lead Implant Date: 20130815
Implantable Lead Location: 753859
Implantable Lead Location: 753860
Implantable Pulse Generator Implant Date: 20110503
Lead Channel Impedance Value: 250 Ohm
Lead Channel Impedance Value: 380 Ohm
Lead Channel Pacing Threshold Amplitude: 0.5 V
Lead Channel Pacing Threshold Amplitude: 2 V
Lead Channel Pacing Threshold Pulse Width: 0.5 ms
Lead Channel Pacing Threshold Pulse Width: 1.3 ms
Lead Channel Sensing Intrinsic Amplitude: 11.4 mV
Lead Channel Sensing Intrinsic Amplitude: 2.3 mV
Lead Channel Setting Pacing Amplitude: 2 V
Lead Channel Setting Pacing Amplitude: 3 V
Lead Channel Setting Pacing Pulse Width: 1.3 ms
Lead Channel Setting Sensing Sensitivity: 0.5 mV
Pulse Gen Serial Number: 778164

## 2019-11-15 NOTE — Progress Notes (Signed)
Remote ICD transmission.   

## 2019-11-16 ENCOUNTER — Other Ambulatory Visit: Payer: Self-pay | Admitting: Hematology & Oncology

## 2019-11-16 DIAGNOSIS — D3910 Neoplasm of uncertain behavior of unspecified ovary: Secondary | ICD-10-CM

## 2019-11-18 ENCOUNTER — Other Ambulatory Visit: Payer: Self-pay

## 2019-11-18 ENCOUNTER — Other Ambulatory Visit: Payer: Self-pay | Admitting: Hematology & Oncology

## 2019-11-18 ENCOUNTER — Ambulatory Visit (INDEPENDENT_AMBULATORY_CARE_PROVIDER_SITE_OTHER): Payer: 59

## 2019-11-18 DIAGNOSIS — D3910 Neoplasm of uncertain behavior of unspecified ovary: Secondary | ICD-10-CM | POA: Diagnosis not present

## 2019-11-18 DIAGNOSIS — I480 Paroxysmal atrial fibrillation: Secondary | ICD-10-CM | POA: Diagnosis not present

## 2019-11-18 DIAGNOSIS — Z953 Presence of xenogenic heart valve: Secondary | ICD-10-CM

## 2019-11-18 DIAGNOSIS — Z952 Presence of prosthetic heart valve: Secondary | ICD-10-CM | POA: Diagnosis not present

## 2019-11-18 LAB — POCT INR: INR: 4 — AB (ref 2.0–3.0)

## 2019-11-18 MED ORDER — WARFARIN SODIUM 7.5 MG PO TABS: ORAL_TABLET | ORAL | 1 refills | Status: DC

## 2019-11-18 NOTE — Patient Instructions (Signed)
Description   Skip today's dosage of Warfarin, then start taking 1 tablet (7.5mg ) daily except 1/2 tablet (3.75mg ) on Mondays and Fridays.  Recheck INR in 3 weeks. Continue normal dark green consistency.  Coumadin Clinic [phone] Main [phone]

## 2019-12-01 ENCOUNTER — Other Ambulatory Visit: Payer: Self-pay | Admitting: Hematology & Oncology

## 2019-12-06 ENCOUNTER — Other Ambulatory Visit: Payer: Self-pay | Admitting: *Deleted

## 2019-12-06 ENCOUNTER — Other Ambulatory Visit: Payer: Self-pay | Admitting: Physician Assistant

## 2019-12-06 MED ORDER — ALBUTEROL SULFATE HFA 108 (90 BASE) MCG/ACT IN AERS: 2.0000 | INHALATION_SPRAY | Freq: Four times a day (QID) | RESPIRATORY_TRACT | 1 refills | Status: DC | PRN

## 2019-12-09 ENCOUNTER — Telehealth: Payer: Self-pay | Admitting: *Deleted

## 2019-12-09 ENCOUNTER — Ambulatory Visit (INDEPENDENT_AMBULATORY_CARE_PROVIDER_SITE_OTHER): Payer: 59 | Admitting: Pharmacist

## 2019-12-09 DIAGNOSIS — I480 Paroxysmal atrial fibrillation: Secondary | ICD-10-CM

## 2019-12-09 DIAGNOSIS — Z952 Presence of prosthetic heart valve: Secondary | ICD-10-CM | POA: Diagnosis not present

## 2019-12-09 DIAGNOSIS — Z953 Presence of xenogenic heart valve: Secondary | ICD-10-CM

## 2019-12-09 LAB — POCT INR: INR: 2.3 (ref 2.0–3.0)

## 2019-12-09 MED ORDER — CARVEDILOL 3.125 MG PO TABS: 3.1250 mg | ORAL_TABLET | Freq: Two times a day (BID) | ORAL | 1 refills | Status: DC

## 2019-12-09 NOTE — Telephone Encounter (Signed)
Pt was in the office today to see [facility],  as scheduled.  Pt was walking out and asked for assistance with a letter she received from our office stating she could not get any refills of her medication, until she schedules an appt with Dr. Tamala Julian.  Pt states she just saw Vin Bhagat PA-C on 07/14/19, and is also followed by Dr. Lovena Le in [place].  Pt does have a recall placed for her to follow-up with Dr. Tamala Julian in 6 months, which would be around middle/end of July 2021.  Pt is inquiring refill of her carvedilol to be sent into her pharmacy of choice.  Did assist her with this, for she is within office guidelines.  Pt did however, want to ask Dr. Tamala Julian and his RN if it is absolutely necessary for her to be seen in July, for she usually see's him on a yearly basis, and she is seen by Dr. Lovena Le and recently saw Vin on 07/14/19.  Pt states she is doing very well, and is asymptomatic.  Pt states she is ok with whatever Dr. Tamala Julian wants her to do, but she prefers seeing him as her usual yearly visit, if he is ok with this plan. Pt states she will be sure to call him, if any cardiac issues arise in the interim. Informed the pt that Dr. Tamala Julian and his RN are both out of the office, but I will route this message to them to further review, advise, and follow-up with her thereafter.  Pt verbalized understanding and agrees with this plan. Pt was more than gracious for all the assistance provided.

## 2019-12-09 NOTE — Patient Instructions (Signed)
Description   °Continue taking 1 tablet (7.5mg) daily except 1/2 tablet (3.75mg) on Mondays and Fridays. Recheck INR in 4 weeks. Continue normal dark green consistency. [facility] [phone] Main [phone] °  ° ° °

## 2019-12-12 NOTE — Telephone Encounter (Signed)
Okay to make the next appointment with me 1 year from our last visit.

## 2019-12-13 NOTE — Telephone Encounter (Signed)
Left detailed message letting pt know ok to see Dr. Tamala Julian 1 year from last visit.  Advised this will be Jan of 2022.  Advised to call back with any questions, otherwise give [place] a call when she receives her letter that it is time to schedule appt.

## 2019-12-16 ENCOUNTER — Other Ambulatory Visit: Payer: Self-pay | Admitting: Hematology & Oncology

## 2019-12-16 ENCOUNTER — Ambulatory Visit (INDEPENDENT_AMBULATORY_CARE_PROVIDER_SITE_OTHER): Payer: BC Managed Care – PPO | Admitting: *Deleted

## 2019-12-16 DIAGNOSIS — D3910 Neoplasm of uncertain behavior of unspecified ovary: Secondary | ICD-10-CM

## 2019-12-16 DIAGNOSIS — I4901 Ventricular fibrillation: Secondary | ICD-10-CM

## 2019-12-16 LAB — CUP PACEART REMOTE DEVICE CHECK
Battery Remaining Longevity: 3 mo
Battery Remaining Percentage: 4 %
Battery Voltage: 2.62 V
Brady Statistic AP VP Percent: 0 %
Brady Statistic AP VS Percent: 1 %
Brady Statistic AS VP Percent: 1 %
Brady Statistic AS VS Percent: 99 %
Brady Statistic RA Percent Paced: 1 %
Brady Statistic RV Percent Paced: 1 %
Date Time Interrogation Session: 20210624040032
HighPow Impedance: 65 Ohm
HighPow Impedance: 65 Ohm
Implantable Lead Implant Date: 20110503
Implantable Lead Implant Date: 20130815
Implantable Lead Location: 753859
Implantable Lead Location: 753860
Implantable Pulse Generator Implant Date: 20110503
Lead Channel Impedance Value: 250 Ohm
Lead Channel Impedance Value: 380 Ohm
Lead Channel Pacing Threshold Amplitude: 0.5 V
Lead Channel Pacing Threshold Amplitude: 2 V
Lead Channel Pacing Threshold Pulse Width: 0.5 ms
Lead Channel Pacing Threshold Pulse Width: 1.3 ms
Lead Channel Sensing Intrinsic Amplitude: 11.4 mV
Lead Channel Sensing Intrinsic Amplitude: 2.1 mV
Lead Channel Setting Pacing Amplitude: 2 V
Lead Channel Setting Pacing Amplitude: 3 V
Lead Channel Setting Pacing Pulse Width: 1.3 ms
Lead Channel Setting Sensing Sensitivity: 0.5 mV
Pulse Gen Serial Number: 778164

## 2019-12-17 ENCOUNTER — Other Ambulatory Visit: Payer: Self-pay | Admitting: Hematology & Oncology

## 2019-12-17 DIAGNOSIS — D3910 Neoplasm of uncertain behavior of unspecified ovary: Secondary | ICD-10-CM

## 2019-12-17 NOTE — Progress Notes (Signed)
Remote ICD transmission.   

## 2019-12-22 ENCOUNTER — Other Ambulatory Visit: Payer: Self-pay | Admitting: Hematology & Oncology

## 2019-12-28 ENCOUNTER — Other Ambulatory Visit: Payer: Self-pay | Admitting: Hematology & Oncology

## 2019-12-28 DIAGNOSIS — D3910 Neoplasm of uncertain behavior of unspecified ovary: Secondary | ICD-10-CM

## 2020-01-06 ENCOUNTER — Other Ambulatory Visit: Payer: Self-pay

## 2020-01-06 ENCOUNTER — Ambulatory Visit (INDEPENDENT_AMBULATORY_CARE_PROVIDER_SITE_OTHER): Payer: 59

## 2020-01-06 DIAGNOSIS — I480 Paroxysmal atrial fibrillation: Secondary | ICD-10-CM | POA: Diagnosis not present

## 2020-01-06 DIAGNOSIS — Z952 Presence of prosthetic heart valve: Secondary | ICD-10-CM

## 2020-01-06 DIAGNOSIS — Z953 Presence of xenogenic heart valve: Secondary | ICD-10-CM

## 2020-01-06 LAB — POCT INR: INR: 1.9 — AB (ref 2.0–3.0)

## 2020-01-06 NOTE — Patient Instructions (Signed)
Description   Take 1.5 tablets today, then resume same dosage 1 tablet (7.5mg ) daily except 1/2 tablet (3.75mg ) on Mondays and Fridays. Recheck INR in 3 weeks. Continue normal dark green consistency.  [facility] [phone] Main [phone]

## 2020-01-11 ENCOUNTER — Inpatient Hospital Stay: Payer: 59

## 2020-01-11 ENCOUNTER — Inpatient Hospital Stay: Payer: 59 | Admitting: Hematology & Oncology

## 2020-01-14 ENCOUNTER — Other Ambulatory Visit: Payer: Self-pay | Admitting: Hematology & Oncology

## 2020-01-14 DIAGNOSIS — F419 Anxiety disorder, unspecified: Secondary | ICD-10-CM

## 2020-01-17 ENCOUNTER — Other Ambulatory Visit: Payer: Self-pay | Admitting: Hematology & Oncology

## 2020-01-17 ENCOUNTER — Ambulatory Visit (INDEPENDENT_AMBULATORY_CARE_PROVIDER_SITE_OTHER): Payer: 59 | Admitting: *Deleted

## 2020-01-17 DIAGNOSIS — D3910 Neoplasm of uncertain behavior of unspecified ovary: Secondary | ICD-10-CM

## 2020-01-17 DIAGNOSIS — I4901 Ventricular fibrillation: Secondary | ICD-10-CM

## 2020-01-17 LAB — CUP PACEART REMOTE DEVICE CHECK
Battery Remaining Longevity: 1 mo
Battery Remaining Percentage: 2 %
Battery Voltage: 2.6 V
Brady Statistic AP VP Percent: 0 %
Brady Statistic AP VS Percent: 1 %
Brady Statistic AS VP Percent: 1 %
Brady Statistic AS VS Percent: 99 %
Brady Statistic RA Percent Paced: 1 %
Brady Statistic RV Percent Paced: 1 %
Date Time Interrogation Session: 20210726080020
HighPow Impedance: 69 Ohm
HighPow Impedance: 69 Ohm
Implantable Lead Implant Date: 20110503
Implantable Lead Implant Date: 20130815
Implantable Lead Location: 753859
Implantable Lead Location: 753860
Implantable Pulse Generator Implant Date: 20110503
Lead Channel Impedance Value: 250 Ohm
Lead Channel Impedance Value: 390 Ohm
Lead Channel Pacing Threshold Amplitude: 0.5 V
Lead Channel Pacing Threshold Amplitude: 2 V
Lead Channel Pacing Threshold Pulse Width: 0.5 ms
Lead Channel Pacing Threshold Pulse Width: 1.3 ms
Lead Channel Sensing Intrinsic Amplitude: 11.4 mV
Lead Channel Sensing Intrinsic Amplitude: 2.2 mV
Lead Channel Setting Pacing Amplitude: 2 V
Lead Channel Setting Pacing Amplitude: 3 V
Lead Channel Setting Pacing Pulse Width: 1.3 ms
Lead Channel Setting Sensing Sensitivity: 0.5 mV
Pulse Gen Serial Number: 778164

## 2020-01-18 ENCOUNTER — Inpatient Hospital Stay (HOSPITAL_BASED_OUTPATIENT_CLINIC_OR_DEPARTMENT_OTHER): Payer: 59 | Admitting: Hematology & Oncology

## 2020-01-18 ENCOUNTER — Other Ambulatory Visit: Payer: Self-pay

## 2020-01-18 ENCOUNTER — Inpatient Hospital Stay: Payer: 59 | Attending: Hematology & Oncology

## 2020-01-18 ENCOUNTER — Inpatient Hospital Stay: Payer: 59

## 2020-01-18 VITALS — BP 107/56 | HR 86 | Temp 98.5°F | Resp 17 | Wt 179.0 lb

## 2020-01-18 DIAGNOSIS — C569 Malignant neoplasm of unspecified ovary: Secondary | ICD-10-CM | POA: Diagnosis not present

## 2020-01-18 DIAGNOSIS — Z95828 Presence of other vascular implants and grafts: Secondary | ICD-10-CM

## 2020-01-18 DIAGNOSIS — Z888 Allergy status to other drugs, medicaments and biological substances status: Secondary | ICD-10-CM | POA: Insufficient documentation

## 2020-01-18 DIAGNOSIS — Z79899 Other long term (current) drug therapy: Secondary | ICD-10-CM | POA: Insufficient documentation

## 2020-01-18 DIAGNOSIS — D3910 Neoplasm of uncertain behavior of unspecified ovary: Secondary | ICD-10-CM

## 2020-01-18 DIAGNOSIS — Z7901 Long term (current) use of anticoagulants: Secondary | ICD-10-CM | POA: Insufficient documentation

## 2020-01-18 DIAGNOSIS — R109 Unspecified abdominal pain: Secondary | ICD-10-CM | POA: Diagnosis not present

## 2020-01-18 LAB — CBC WITH DIFFERENTIAL (CANCER CENTER ONLY)
Abs Immature Granulocytes: 0.03 10*3/uL (ref 0.00–0.07)
Basophils Absolute: 0 10*3/uL (ref 0.0–0.1)
Basophils Relative: 0 %
Eosinophils Absolute: 0.1 10*3/uL (ref 0.0–0.5)
Eosinophils Relative: 2 %
HCT: 33.6 % — ABNORMAL LOW (ref 36.0–46.0)
Hemoglobin: 11.1 g/dL — ABNORMAL LOW (ref 12.0–15.0)
Immature Granulocytes: 0 %
Lymphocytes Relative: 12 %
Lymphs Abs: 0.8 10*3/uL (ref 0.7–4.0)
MCH: 30.3 pg (ref 26.0–34.0)
MCHC: 33 g/dL (ref 30.0–36.0)
MCV: 91.8 fL (ref 80.0–100.0)
Monocytes Absolute: 0.7 10*3/uL (ref 0.1–1.0)
Monocytes Relative: 11 %
Neutro Abs: 5.1 10*3/uL (ref 1.7–7.7)
Neutrophils Relative %: 75 %
Platelet Count: 172 10*3/uL (ref 150–400)
RBC: 3.66 MIL/uL — ABNORMAL LOW (ref 3.87–5.11)
RDW: 14.4 % (ref 11.5–15.5)
WBC Count: 6.8 10*3/uL (ref 4.0–10.5)
nRBC: 0 % (ref 0.0–0.2)

## 2020-01-18 LAB — CMP (CANCER CENTER ONLY)
ALT: 9 U/L (ref 0–44)
AST: 13 U/L — ABNORMAL LOW (ref 15–41)
Albumin: 4.2 g/dL (ref 3.5–5.0)
Alkaline Phosphatase: 56 U/L (ref 38–126)
Anion gap: 9 (ref 5–15)
BUN: 16 mg/dL (ref 6–20)
CO2: 28 mmol/L (ref 22–32)
Calcium: 9.1 mg/dL (ref 8.9–10.3)
Chloride: 100 mmol/L (ref 98–111)
Creatinine: 1.24 mg/dL — ABNORMAL HIGH (ref 0.44–1.00)
GFR, Est AFR Am: 55 mL/min — ABNORMAL LOW (ref >60)
GFR, Estimated: 47 mL/min — ABNORMAL LOW (ref >60)
Glucose, Bld: 107 mg/dL — ABNORMAL HIGH (ref 70–99)
Potassium: 3.3 mmol/L — ABNORMAL LOW (ref 3.5–5.1)
Sodium: 137 mmol/L (ref 135–145)
Total Bilirubin: 0.3 mg/dL (ref 0.3–1.2)
Total Protein: 6.2 g/dL — ABNORMAL LOW (ref 6.5–8.1)

## 2020-01-18 LAB — LACTATE DEHYDROGENASE: LDH: 225 U/L — ABNORMAL HIGH (ref 98–192)

## 2020-01-18 MED ORDER — SODIUM CHLORIDE 0.9% FLUSH
10.0000 mL | Freq: Once | INTRAVENOUS | Status: AC
  Administered 2020-01-18: 10 mL via INTRAVENOUS
  Filled 2020-01-18: qty 10

## 2020-01-18 MED ORDER — HEPARIN SOD (PORK) LOCK FLUSH 100 UNIT/ML IV SOLN
500.0000 [IU] | Freq: Once | INTRAVENOUS | Status: AC
  Administered 2020-01-18: 500 [IU] via INTRAVENOUS
  Filled 2020-01-18: qty 5

## 2020-01-18 NOTE — Progress Notes (Signed)
Remote ICD transmission.   

## 2020-01-18 NOTE — Progress Notes (Signed)
Hematology and Oncology Follow Up Visit  [name] [id number] 1960/11/15 59 y.o. 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  It has been a tough year for her.  Unfortunately, her mom passed away in 2022/08/12.  I think her father passed away in 2022-10-11.  On the good side, her son is getting married in September.  She is getting ready for this.  She is having trouble sleeping.  She is on Ambien at 5 mg at nighttime.  We may have to increase this up to 10 mg at nighttime.  She has had no problems with bowels or bladder.  She has some abdominal discomfort.  She has had no cough or shortness of breath.  She is still waiting to have the coronavirus vaccine.  She is not sure she really wants is right now.  Back in 10/11/22, her inhibin B level was 8.8 and holding steady.  She has had no rashes.  There has been no leg swelling..  She is on Coumadin.  Cardiology is managing this.  She has had that the INR has been fluctuating.    Overall, her performance status is ECOG 0..    Medications:  Allergies as of 01/18/2020      Reactions   Prochlorperazine Edisylate Other (See Comments)   Nervous/ flutter/ shakes   Adhesive [tape] Hives   Redness/hives from adhesive tape( tolerates latex gloves); tolerates paper tape      Medication List       Accurate as of January 18, 2020 12:31 PM. If you have any questions, ask your nurse or doctor.        albuterol 108 (90 Base) MCG/ACT inhaler Commonly known as: VENTOLIN HFA Inhale 2 puffs into the lungs every 6 (six) hours as needed for wheezing or shortness of breath.   amoxicillin 500 MG tablet Commonly known as: AMOXIL Take 2,000 mg by mouth. Take 2,000 mg one hour prior to dental visits.   aspirin EC 81 MG tablet Take 81 mg by mouth daily.   carvedilol  3.125 MG tablet Commonly known as: COREG Take 1 tablet (3.125 mg total) by mouth 2 (two) times daily with a meal.   furosemide 20 MG tablet Commonly known as: LASIX TAKE 1 TABLET BY MOUTH EVERY DAY   lidocaine-prilocaine cream Commonly known as: EMLA Apply 1 application topically as needed. Apply to port per instructions   LORazepam 0.5 MG tablet Commonly known as: ATIVAN TAKE 1 TABLET (0.5 MG TOTAL) BY MOUTH EVERY 6 (SIX) HOURS AS NEEDED. FOR ANXIETY   methocarbamol 500 MG tablet Commonly known as: ROBAXIN TAKE 1 TABLET (500 MG TOTAL) BY MOUTH EVERY 6 (SIX) HOURS AS NEEDED FOR MUSCLE SPASMS.   Potassium Chloride ER 20 MEQ Tbcr Take 40 mEq by mouth daily. With extra as needed. Please take 4 tablets ( 80 meq total) ONE TIME ONLY on 06/12/19)   spironolactone 25 MG tablet Commonly known as: ALDACTONE Take 1 tablet (25 mg total) by mouth daily.   traMADol 50 MG tablet Commonly known as: ULTRAM TAKE 1 TABLET (50 MG TOTAL) BY MOUTH EVERY 6 (SIX) HOURS AS NEEDED.   Vitamin D (Ergocalciferol) 1.25 MG (50000 UNIT) Caps capsule Commonly known as: DRISDOL TAKE 1 CAPSULE (50,000 UNITS TOTAL) BY MOUTH EVERY 7 (SEVEN) DAYS.   warfarin 7.5 MG tablet Commonly  known as: COUMADIN Take as directed by the anticoagulation clinic. If you are unsure how to take this medication, talk to your nurse or doctor. Original instructions: Take as directed by Anticoagulation Clinic.   zolpidem 5 MG tablet Commonly known as: AMBIEN TAKE 1 TABLET BY MOUTH EVERYDAY AT BEDTIME       Allergies:  Allergies  Allergen Reactions  . Prochlorperazine Edisylate Other (See Comments)    Nervous/ flutter/ shakes  . Adhesive [Tape] Hives    Redness/hives from adhesive tape( tolerates latex gloves); tolerates paper tape    Past Medical History, Surgical history, Social history, and Family History were reviewed and updated.  Review of Systems:  Review of Systems  Constitutional: Negative.   HENT:  Negative.   Eyes: Negative.   Respiratory: Negative.   Cardiovascular: Negative.   Gastrointestinal: Negative.   Genitourinary: Negative.   Musculoskeletal: Negative.   Skin: Negative.   Neurological: Negative.   Endo/Heme/Allergies: Negative.   Psychiatric/Behavioral: Negative.       Exam:  weight is 179 lb (81.2 kg). Her oral temperature is 98.5 F (36.9 C). Her blood pressure is 107/56 (abnormal) and her pulse is 86. Her respiration is 17 and oxygen saturation is 99%.   Wt Readings from Last 3 Encounters:  01/18/20 179 lb (81.2 kg)  09/14/19 180 lb 1.9 oz (81.7 kg)  09/14/19 180 lb 1.9 oz (81.7 kg)    Physical Exam Vitals reviewed.  HENT:     Head: Normocephalic and atraumatic.  Eyes:     Pupils: Pupils are equal, round, and reactive to light.  Cardiovascular:     Rate and Rhythm: Normal rate and regular rhythm.     Heart sounds: Normal heart sounds.  Pulmonary:     Effort: Pulmonary effort is normal.     Breath sounds: Normal breath sounds.  Abdominal:     General: Bowel sounds are normal.     Palpations: Abdomen is soft.  Musculoskeletal:        General: No tenderness or deformity. Normal range of motion.     Cervical back: Normal range of motion.  Lymphadenopathy:     Cervical: No cervical adenopathy.  Skin:    General: Skin is warm and dry.     Findings: No erythema or rash.  Neurological:     Mental Status: She is alert and oriented to person, place, and time.  Psychiatric:        Behavior: Behavior normal.        Thought Content: Thought content normal.        Judgment: Judgment normal.      Lab Results  Component Value Date   WBC 6.8 01/18/2020   HGB 11.1 (L) 01/18/2020   HCT 33.6 (L) 01/18/2020   MCV 91.8 01/18/2020   PLT 172 01/18/2020   Lab Results  Component Value Date   FERRITIN 181 07/24/2018   IRON 69 07/24/2018   TIBC 286 07/24/2018   UIBC 217 07/24/2018   IRONPCTSAT 24 07/24/2018   Lab Results  Component Value Date    RETICCTPCT 3.1 07/24/2018   RBC 3.66 (L) 01/18/2020   No results found for: KPAFRELGTCHN, LAMBDASER, KAPLAMBRATIO No results found for: IGGSERUM, IGA, IGMSERUM No results found for: Ronnald Ramp, A1GS, A2GS, Tillman Sers, SPEI   Chemistry      Component Value Date/Time   NA 137 01/18/2020 1121   NA 141 07/14/2019 1223   NA 140 03/15/2016 0953   K 3.3 (L) 01/18/2020 1121  K 3.9 03/15/2016 0953   CL 100 01/18/2020 1121   CL 102 12/13/2015 0803   CO2 28 01/18/2020 1121   CO2 19 (L) 03/15/2016 0953   BUN 16 01/18/2020 1121   BUN 12 07/14/2019 1223   BUN 15.9 03/15/2016 0953   CREATININE 1.24 (H) 01/18/2020 1121   CREATININE 1.4 (H) 03/15/2016 0953      Component Value Date/Time   CALCIUM 9.1 01/18/2020 1121   CALCIUM 9.3 03/15/2016 0953   ALKPHOS 56 01/18/2020 1121   ALKPHOS 55 03/15/2016 0953   AST 13 (L) 01/18/2020 1121   AST 17 03/15/2016 0953   ALT 9 01/18/2020 1121   ALT 23 03/15/2016 0953   BILITOT 0.3 01/18/2020 1121   BILITOT 0.38 03/15/2016 0953       Impression and Plan: Ms. Lawal is a very pleasant 59 yo caucasian female with recurrent granulosa cell tumor.  I think everything will hinge on the inhibin B level.  Hopefully, we will see that it will stay low and stable for a while.  I will get her back in 4 months.  I think this is reasonable.  I do not see need for any type of scans that we have to do right now.    She will always let us know if she has any problems.   Volanda Napoleon, MD 7/27/202112:31 PM

## 2020-01-18 NOTE — Patient Instructions (Signed)

## 2020-01-20 LAB — INHIBIN B: Inhibin B: <7 pg/mL (ref 0.0–16.9)

## 2020-02-02 ENCOUNTER — Other Ambulatory Visit: Payer: Self-pay | Admitting: Hematology & Oncology

## 2020-02-02 ENCOUNTER — Other Ambulatory Visit: Payer: Self-pay

## 2020-02-02 DIAGNOSIS — D3910 Neoplasm of uncertain behavior of unspecified ovary: Secondary | ICD-10-CM

## 2020-02-02 MED ORDER — TRAMADOL HCL 50 MG PO TABS: 50.0000 mg | ORAL_TABLET | Freq: Four times a day (QID) | ORAL | 0 refills | Status: DC | PRN

## 2020-02-03 ENCOUNTER — Other Ambulatory Visit: Payer: Self-pay | Admitting: Hematology & Oncology

## 2020-02-03 DIAGNOSIS — D3910 Neoplasm of uncertain behavior of unspecified ovary: Secondary | ICD-10-CM

## 2020-02-07 ENCOUNTER — Telehealth: Payer: Self-pay | Admitting: Emergency Medicine

## 2020-02-07 ENCOUNTER — Other Ambulatory Visit: Payer: Self-pay | Admitting: Hematology & Oncology

## 2020-02-07 DIAGNOSIS — F419 Anxiety disorder, unspecified: Secondary | ICD-10-CM

## 2020-02-07 NOTE — Telephone Encounter (Signed)
Patient notified her device is at [facility]. Patient will be placed on Dr Tanna Furry schedule to discuss generator change. Patient is agreeable to video visit if available.

## 2020-02-10 ENCOUNTER — Ambulatory Visit (INDEPENDENT_AMBULATORY_CARE_PROVIDER_SITE_OTHER): Payer: 59 | Admitting: *Deleted

## 2020-02-10 ENCOUNTER — Other Ambulatory Visit: Payer: Self-pay

## 2020-02-10 DIAGNOSIS — Z953 Presence of xenogenic heart valve: Secondary | ICD-10-CM

## 2020-02-10 DIAGNOSIS — I480 Paroxysmal atrial fibrillation: Secondary | ICD-10-CM | POA: Diagnosis not present

## 2020-02-10 DIAGNOSIS — Z952 Presence of prosthetic heart valve: Secondary | ICD-10-CM | POA: Diagnosis not present

## 2020-02-10 DIAGNOSIS — Z5181 Encounter for therapeutic drug level monitoring: Secondary | ICD-10-CM

## 2020-02-10 LAB — POCT INR: INR: 3.1 — AB (ref 2.0–3.0)

## 2020-02-10 NOTE — Patient Instructions (Signed)
Description   Today take 1/2 tablet then resume taking 1 tablet (7.5mg ) daily except 1/2 tablet (3.75mg ) on Mondays and Fridays. Recheck INR in 3 weeks. Continue normal dark green consistency.  [facility] Clinic [phone] Main [phone]

## 2020-02-16 ENCOUNTER — Other Ambulatory Visit: Payer: Self-pay | Admitting: Hematology & Oncology

## 2020-02-16 DIAGNOSIS — D3910 Neoplasm of uncertain behavior of unspecified ovary: Secondary | ICD-10-CM

## 2020-02-17 ENCOUNTER — Ambulatory Visit (INDEPENDENT_AMBULATORY_CARE_PROVIDER_SITE_OTHER): Payer: BC Managed Care – PPO | Admitting: *Deleted

## 2020-02-17 DIAGNOSIS — I472 Ventricular tachycardia: Secondary | ICD-10-CM

## 2020-02-17 DIAGNOSIS — I4729 Other ventricular tachycardia: Secondary | ICD-10-CM

## 2020-02-17 LAB — CUP PACEART REMOTE DEVICE CHECK
Battery Remaining Longevity: 0 mo
Battery Voltage: 2.59 V
Brady Statistic AP VP Percent: 0 %
Brady Statistic AP VS Percent: 1 %
Brady Statistic AS VP Percent: 1 %
Brady Statistic AS VS Percent: 99 %
Brady Statistic RA Percent Paced: 1 %
Brady Statistic RV Percent Paced: 1 %
Date Time Interrogation Session: 20210826041034
HighPow Impedance: 63 Ohm
HighPow Impedance: 63 Ohm
Implantable Lead Implant Date: 20110503
Implantable Lead Implant Date: 20130815
Implantable Lead Location: 753859
Implantable Lead Location: 753860
Implantable Pulse Generator Implant Date: 20110503
Lead Channel Impedance Value: 250 Ohm
Lead Channel Impedance Value: 340 Ohm
Lead Channel Pacing Threshold Amplitude: 0.5 V
Lead Channel Pacing Threshold Amplitude: 2 V
Lead Channel Pacing Threshold Pulse Width: 0.5 ms
Lead Channel Pacing Threshold Pulse Width: 1.3 ms
Lead Channel Sensing Intrinsic Amplitude: 1.7 mV
Lead Channel Sensing Intrinsic Amplitude: 11.4 mV
Lead Channel Setting Pacing Amplitude: 2 V
Lead Channel Setting Pacing Amplitude: 3 V
Lead Channel Setting Pacing Pulse Width: 1.3 ms
Lead Channel Setting Sensing Sensitivity: 0.5 mV
Pulse Gen Serial Number: 778164

## 2020-02-22 ENCOUNTER — Other Ambulatory Visit: Payer: Self-pay | Admitting: Hematology & Oncology

## 2020-02-22 DIAGNOSIS — D3910 Neoplasm of uncertain behavior of unspecified ovary: Secondary | ICD-10-CM

## 2020-02-22 NOTE — Progress Notes (Signed)
Remote ICD transmission.   

## 2020-03-02 ENCOUNTER — Ambulatory Visit (INDEPENDENT_AMBULATORY_CARE_PROVIDER_SITE_OTHER): Payer: 59

## 2020-03-02 ENCOUNTER — Other Ambulatory Visit: Payer: Self-pay

## 2020-03-02 ENCOUNTER — Ambulatory Visit (INDEPENDENT_AMBULATORY_CARE_PROVIDER_SITE_OTHER): Payer: 59 | Admitting: Internal Medicine

## 2020-03-02 ENCOUNTER — Other Ambulatory Visit: Payer: Self-pay | Admitting: Hematology & Oncology

## 2020-03-02 ENCOUNTER — Encounter: Payer: Self-pay | Admitting: Internal Medicine

## 2020-03-02 VITALS — BP 102/62 | HR 96 | Ht 66.0 in | Wt 181.0 lb

## 2020-03-02 DIAGNOSIS — I4729 Other ventricular tachycardia: Secondary | ICD-10-CM

## 2020-03-02 DIAGNOSIS — Z952 Presence of prosthetic heart valve: Secondary | ICD-10-CM

## 2020-03-02 DIAGNOSIS — Z953 Presence of xenogenic heart valve: Secondary | ICD-10-CM

## 2020-03-02 DIAGNOSIS — I480 Paroxysmal atrial fibrillation: Secondary | ICD-10-CM

## 2020-03-02 DIAGNOSIS — I472 Ventricular tachycardia: Secondary | ICD-10-CM

## 2020-03-02 DIAGNOSIS — Z95810 Presence of automatic (implantable) cardiac defibrillator: Secondary | ICD-10-CM | POA: Diagnosis not present

## 2020-03-02 DIAGNOSIS — I5022 Chronic systolic (congestive) heart failure: Secondary | ICD-10-CM

## 2020-03-02 DIAGNOSIS — F419 Anxiety disorder, unspecified: Secondary | ICD-10-CM

## 2020-03-02 LAB — POCT INR: INR: 2.1 (ref 2.0–3.0)

## 2020-03-02 NOTE — Progress Notes (Signed)
HPI [name] returns today for ICD followup. She is a pleasant 59 yo woman with a h/o chronic systolic heart failure, s/p MV repair who developed mitral stenosis and underwent transcatheter mitral valve replacement over a year ago. Her symptoms are much improved. She denies chest pain or sob. She has reached ERI. She has not had any ICD therapies. Her EF was 40% at last check. She has a h/o VT cardiac arrest.  Allergies  Allergen Reactions  . Prochlorperazine Edisylate Other (See Comments)    Nervous/ flutter/ shakes  . Adhesive [Tape] Hives    Redness/hives from adhesive tape( tolerates latex gloves); tolerates paper tape     Current Outpatient Medications  Medication Sig Dispense Refill  . albuterol (VENTOLIN HFA) 108 (90 Base) MCG/ACT inhaler Inhale 2 puffs into the lungs every 6 (six) hours as needed for wheezing or shortness of breath. 8 g 1  . amoxicillin (AMOXIL) 500 MG tablet Take 2,000 mg by mouth. Take 2,000 mg one hour prior to dental visits.    Marland Kitchen aspirin EC 81 MG tablet Take 81 mg by mouth daily.    . carvedilol (COREG) 3.125 MG tablet Take 1 tablet (3.125 mg total) by mouth 2 (two) times daily with a meal. 180 tablet 1  . furosemide (LASIX) 20 MG tablet TAKE 1 TABLET BY MOUTH EVERY DAY 90 tablet 2  . lidocaine-prilocaine (EMLA) cream Apply 1 application topically as needed. Apply to port per instructions 30 g 0  . LORazepam (ATIVAN) 0.5 MG tablet TAKE 1 TABLET (0.5 MG TOTAL) BY MOUTH EVERY 6 (SIX) HOURS AS NEEDED. FOR ANXIETY 60 tablet 0  . methocarbamol (ROBAXIN) 500 MG tablet TAKE 1 TABLET (500 MG TOTAL) BY MOUTH EVERY 6 (SIX) HOURS AS NEEDED FOR MUSCLE SPASMS. 60 tablet 2  . Potassium Chloride ER 20 MEQ TBCR Take 40 mEq by mouth daily. With extra as needed. Please take 4 tablets ( 80 meq total) ONE TIME ONLY on 06/12/19) 75 tablet 3  . traMADol (ULTRAM) 50 MG tablet TAKE 1 TABLET (50 MG TOTAL) BY MOUTH EVERY 6 (SIX) HOURS AS NEEDED. 60 tablet 0  . Vitamin D,  Ergocalciferol, (DRISDOL) 1.25 MG (50000 UNIT) CAPS capsule TAKE 1 CAPSULE (50,000 UNITS TOTAL) BY MOUTH EVERY 7 (SEVEN) DAYS. 12 capsule 3  . warfarin (COUMADIN) 7.5 MG tablet Take as directed by Anticoagulation Clinic. 90 tablet 1  . zolpidem (AMBIEN) 5 MG tablet TAKE 1 TABLET BY MOUTH EVERYDAY AT BEDTIME 30 tablet 0  . spironolactone (ALDACTONE) 25 MG tablet Take 1 tablet (25 mg total) by mouth daily. 90 tablet 3   No current facility-administered medications for this visit.   Facility-Administered Medications Ordered in Other Visits  Medication Dose Route Frequency Provider Last Rate Last Admin  . sodium chloride flush (NS) 0.9 % injection 10 mL  10 mL Intravenous PRN Volanda Napoleon, MD   10 mL at 09/14/18 1150     Past Medical History:  Diagnosis Date  . Arthritis    "everywhere"  . Asthma    as a child  . Automatic implantable cardioverter-defibrillator in situ    DR. Beckie Salts   . Bladder cancer [facility]) 2009   "injected medicine to get rid of it"  . Chronic systolic CHF (congestive heart failure) ([name])   . CKD (chronic kidney disease), stage III   . Coronary artery disease    a. large RV/inferior MI complicated by pseudoaneurysm s/p aneurysemectomy and CABG 10/2009 with papillary muscle  rupture requiring bioprosthetic mitral valve replacement in [facility].  . Granulosa cell carcinoma of ovary ([place])    recurrent - surgical resection 2007, 2014 and 2016   . H/O thymectomy   . High cholesterol   . Hypothyroidism   . ICD (implantable cardiac defibrillator) in place   . Ischemic cardiomyopathy    a.  ICM and VT arrest 10/2009 s/p AICD (revision 01/2012 due to RV lead problem).  . Myocardial infarction ([place]) 10/14/2009  . Neuropathy    FEET AND HANDS - FROM CHEMO - BUT MUCH IMPROVED  . PAF (paroxysmal atrial fibrillation) ([place])   . Pain    JOINT PAINS AND MUSCLE ACHES ALL OVER.  Marland Kitchen Port-A-Cath in place    RIGHT UPPER CHEST  . Prosthetic valve dysfunction   . S/P mitral  valve replacement with bioprosthetic valve 10/21/2009   Edwards Perimount stented bovine pericardial tissue valve, size 29 mm  . S/P valve-in-valve transcatheter mitral valve replacement 09/16/2017   29 mm Edwards Sapien 3 transcatheter heart valve placed via transapical approach  . SVT (supraventricular tachycardia) ([place])   . Tobacco abuse   . Ventricular tachycardia ([place])     ROS:   All systems reviewed and negative except as noted in the HPI.   Past Surgical History:  Procedure Laterality Date  . ABDOMINAL HYSTERECTOMY N/A 07/27/2013   Procedure: TUMOR EXCISION OF ABDOMINAL MASS;  Surgeon: Alvino Chapel, MD;  Location: [facility]S;  Service: Gynecology;  Laterality: N/A;  . APPENDECTOMY  1989  . BILATERAL OOPHORECTOMY  2007  . CARDIAC DEFIBRILLATOR PLACEMENT  02/06/2012   "lead change"  . CARDIAC VALVE REPLACEMENT    . CYSTOSCOPY Right 02/23/2013   Procedure: CYSTOSCOPY WITH STENT REMOVAL;  Surgeon: Alvino Chapel, MD;  Location: [facility];  Service: Gynecology;  Laterality: Right;  . IMPLANTABLE CARDIOVERTER DEFIBRILLATOR REVISION N/A 02/06/2012   Procedure: IMPLANTABLE CARDIOVERTER DEFIBRILLATOR REVISION;  Surgeon: Evans Lance, MD;  Location: [facility] CATH LAB;  Service: Cardiovascular;  Laterality: N/A;  . INSERT / REPLACE / REMOVE PACEMAKER  10/2009   DUAL-CHAMBER [place]DE DEFIBRILLATOR  . IR IMAGING GUIDED PORT INSERTION  05/11/2018  . LAPAROTOMY N/A 01/19/2013   Procedure: TUMOR DEBULKING / BOWEL RESECTION/INSERTION RIGHT UTERERAL DOUBLE J STENT/OMENTECTOMY;  Surgeon: Alvino Chapel, MD;  Location: [facility];  Service: Gynecology;  Laterality: N/A;  . MITRAL VALVE REPLACEMENT  10/21/2009   60mm Edwards Perimount bovine pericardial tissue valve - [facility]  . RIGHT/LEFT HEART CATH AND CORONARY ANGIOGRAPHY N/A 07/02/2017   Procedure: RIGHT/LEFT HEART CATH AND CORONARY ANGIOGRAPHY;  Surgeon: Belva Crome, MD;  Location: [facility] CV LAB;  Service: Cardiovascular;   Laterality: N/A;  . TEE WITHOUT CARDIOVERSION N/A 09/16/2017   Procedure: TRANSESOPHAGEAL ECHOCARDIOGRAM (TEE);  Surgeon: [name], MD;  Location: [place];  Service: Open Heart Surgery;  Laterality: N/A;  . THYMECTOMY  2009  . TONSILLECTOMY AND ADENOIDECTOMY  ~ 1967  . TOTAL HIP ARTHROPLASTY  05/06/2012   Procedure: TOTAL HIP ARTHROPLASTY;  Surgeon: Gearlean Alf, MD;  Location: [facility];  Service: Orthopedics;  Laterality: Right;  . TRANSCATHETER MITRAL VALVE REPLACEMENT, TRANSAPICAL N/A 09/16/2017   Procedure: TRANSCATHETER MITRAL VALVE REPLACEMENT,TRANSAPICAL;  Surgeon: [name], MD;  Location: [place];  Service: Open Heart Surgery;  Laterality: N/A;  . TRANSURETHRAL RESECTION OF BLADDER  2009   "for bladder cancer"  . TUBAL LIGATION  1993  . VAGINAL HYSTERECTOMY  2001     Family History  Problem Relation  Age of Onset  . Heart attack Paternal Grandfather   . Heart attack Maternal Grandfather   . Stroke Maternal Grandmother   . Heart attack Brother      Social History   Socioeconomic History  . Marital status: Divorced    Spouse name: Not on file  . Number of children: Not on file  . Years of education: Not on file  . Highest education level: Not on file  Occupational History    Comment: WORKS FULL TIME  Tobacco Use  . Smoking status: Current Every Day Smoker    Packs/day: 0.50    Years: 30.00    Pack years: 15.00    Types: Cigarettes    Start date: 03/28/1974  . Smokeless tobacco: Never Used  Vaping Use  . Vaping Use: Never used  Substance and Sexual Activity  . Alcohol use: No    Alcohol/week: 0.0 standard drinks  . Drug use: No  . Sexual activity: Not on file  Other Topics Concern  . Not on file  Social History Narrative   WORKS FULL TIME   SINGLE   TOBACCO USE-YES   IMPLANTATION OF DUAL-CHAMBER [place]   Social Determinants of Health   Financial Resource Strain:   . Difficulty of Paying Living Expenses: Not on file  Food  Insecurity:   . Worried About Charity fundraiser in the Last Year: Not on file  . Ran Out of Food in the Last Year: Not on file  Transportation Needs:   . Lack of Transportation (Medical): Not on file  . Lack of Transportation (Non-Medical): Not on file  Physical Activity:   . Days of Exercise per Week: Not on file  . Minutes of Exercise per Session: Not on file  Stress:   . Feeling of Stress : Not on file  Social Connections:   . Frequency of Communication with Friends and Family: Not on file  . Frequency of Social Gatherings with Friends and Family: Not on file  . Attends Religious Services: Not on file  . Active Member of Clubs or Organizations: Not on file  . Attends Archivist Meetings: Not on file  . Marital Status: Not on file  Intimate Partner Violence:   . Fear of Current or Ex-Partner: Not on file  . Emotionally Abused: Not on file  . Physically Abused: Not on file  . Sexually Abused: Not on file     BP 102/62   Pulse 96   Ht 5\' 6"  (1.676 m)   Wt 181 lb (82.1 kg)   SpO2 97%   BMI 29.21 kg/m   Physical Exam:  Well appearing NAD HEENT: Unremarkable Neck:  No JVD, no thyromegally Lymphatics:  No adenopathy Back:  No CVA tenderness Lungs:  Clear with no wheezes HEART:  Regular rate rhythm, no murmurs, no rubs, no clicks Abd:  soft, positive bowel sounds, no organomegally, no rebound, no guarding Ext:  2 plus pulses, no edema, no cyanosis, no clubbing Skin:  No rashes no nodules Neuro:  CN II through XII intact, motor grossly intact  EKG - nsr with inferior MI  DEVICE  Normal device function.  See PaceArt for details. ERI  Assess/Plan: 1. VT - she has not had any additional episodes. 2. ICD - her [place] ICD is working normally. She has reached ERI. We will schedule ICD gen change out. 3. Atrial lead malfunction -she has a low pacing impedence but p waves and the atrial threshold are still  good. As she does not pace much, I have recommended  that we not remove or add a new atrial lead. 4. Chronic systolic heart failure - her symptoms are class 2. She has an EF of 40% by echo. She will continue her current meds  Carleene Overlie Leviticus Harton,MD

## 2020-03-02 NOTE — Patient Instructions (Addendum)
Medication Instructions:  Your physician recommends that you continue on your current medications as directed. Please refer to the Current Medication list given to you today.  Labwork: None ordered.  Testing/Procedures: None ordered.  Follow-Up:  SEE INSTRUCTION LETTER  Any Other Special Instructions Will Be Listed Below (If Applicable).  If you need a refill on your cardiac medications before your next appointment, please call your pharmacy.    ICD Battery Change  A pacemaker battery usually lasts 5-15 years (6-7 years on average). A few times a year, you will be asked to visit your health care provider to have a full evaluation of your pacemaker. When the battery is low, your pacemaker battery and generator will be completely replaced. Most often, this procedure is simpler than the first surgery because the wires (leads) that connect the generator to the heart are already in place. There are many things that affect how long a pacemaker battery will last, including:  The age of the pacemaker.  The number of leads you have(1, 2, or 3).  The pacemaker workload. If the pacemaker is helping the heart more often, the battery will not last as long.  Power (voltage) settings.

## 2020-03-02 NOTE — Patient Instructions (Signed)
Description   Continue on same dosage 1 tablet (7.5mg ) daily except 1/2 tablet (3.75mg ) on Mondays and Fridays. Recheck INR in 4 weeks. Continue normal dark green consistency.  Coumadin Clinic [phone] Main [phone]

## 2020-03-03 LAB — CUP PACEART INCLINIC DEVICE CHECK
Battery Remaining Longevity: 0 mo
Brady Statistic RA Percent Paced: 0.01 %
Brady Statistic RV Percent Paced: 0.02 %
Date Time Interrogation Session: 20210909174434
HighPow Impedance: 67.5 Ohm
Implantable Lead Implant Date: 20110503
Implantable Lead Implant Date: 20130815
Implantable Lead Location: 753859
Implantable Lead Location: 753860
Implantable Pulse Generator Implant Date: 20110503
Lead Channel Impedance Value: 262.5 Ohm
Lead Channel Impedance Value: 400 Ohm
Lead Channel Pacing Threshold Amplitude: 0.5 V
Lead Channel Pacing Threshold Amplitude: 0.5 V
Lead Channel Pacing Threshold Amplitude: 1.75 V
Lead Channel Pacing Threshold Amplitude: 1.75 V
Lead Channel Pacing Threshold Pulse Width: 0.5 ms
Lead Channel Pacing Threshold Pulse Width: 0.5 ms
Lead Channel Pacing Threshold Pulse Width: 1.3 ms
Lead Channel Pacing Threshold Pulse Width: 1.3 ms
Lead Channel Sensing Intrinsic Amplitude: 11.4 mV
Lead Channel Sensing Intrinsic Amplitude: 2.4 mV
Lead Channel Setting Pacing Amplitude: 2 V
Lead Channel Setting Pacing Amplitude: 3 V
Lead Channel Setting Pacing Pulse Width: 1.3 ms
Lead Channel Setting Sensing Sensitivity: 0.5 mV
Pulse Gen Serial Number: 778164

## 2020-03-03 NOTE — Addendum Note (Signed)
Addended by: Rose Phi on: 03/03/2020 04:24 PM   Modules accepted: Orders

## 2020-03-06 ENCOUNTER — Telehealth: Payer: Self-pay | Admitting: Internal Medicine

## 2020-03-06 NOTE — Telephone Encounter (Signed)
New message    Pt received her first covid vaccine on Friday. She is due to get the second on 10.01. She is wanting to know if she should go ahead and proceed with the 2nd since her procedure is 10/11. Please call.

## 2020-03-08 NOTE — Telephone Encounter (Signed)
Responded via myChart.  Pt read message.

## 2020-03-09 ENCOUNTER — Other Ambulatory Visit: Payer: Self-pay | Admitting: Student

## 2020-03-09 DIAGNOSIS — I5022 Chronic systolic (congestive) heart failure: Secondary | ICD-10-CM

## 2020-03-12 ENCOUNTER — Other Ambulatory Visit: Payer: Self-pay | Admitting: Hematology & Oncology

## 2020-03-13 ENCOUNTER — Other Ambulatory Visit: Payer: Self-pay | Admitting: Hematology & Oncology

## 2020-03-13 DIAGNOSIS — D3910 Neoplasm of uncertain behavior of unspecified ovary: Secondary | ICD-10-CM

## 2020-03-14 ENCOUNTER — Other Ambulatory Visit: Payer: Self-pay | Admitting: *Deleted

## 2020-03-14 DIAGNOSIS — D3910 Neoplasm of uncertain behavior of unspecified ovary: Secondary | ICD-10-CM

## 2020-03-14 MED ORDER — TRAMADOL HCL 50 MG PO TABS: 50.0000 mg | ORAL_TABLET | Freq: Four times a day (QID) | ORAL | 0 refills | Status: DC | PRN

## 2020-03-16 ENCOUNTER — Other Ambulatory Visit: Payer: Self-pay | Admitting: Hematology & Oncology

## 2020-03-16 DIAGNOSIS — D3910 Neoplasm of uncertain behavior of unspecified ovary: Secondary | ICD-10-CM

## 2020-03-20 ENCOUNTER — Ambulatory Visit (INDEPENDENT_AMBULATORY_CARE_PROVIDER_SITE_OTHER): Payer: 59 | Admitting: Emergency Medicine

## 2020-03-20 DIAGNOSIS — I4729 Other ventricular tachycardia: Secondary | ICD-10-CM

## 2020-03-20 DIAGNOSIS — I472 Ventricular tachycardia: Secondary | ICD-10-CM

## 2020-03-21 LAB — CUP PACEART REMOTE DEVICE CHECK
Battery Remaining Longevity: 0 mo
Battery Voltage: 2.59 V
Brady Statistic AP VP Percent: 0 %
Brady Statistic AP VS Percent: 1 %
Brady Statistic AS VP Percent: 1 %
Brady Statistic AS VS Percent: 99 %
Brady Statistic RA Percent Paced: 1 %
Brady Statistic RV Percent Paced: 1 %
Date Time Interrogation Session: 20210927042110
HighPow Impedance: 64 Ohm
HighPow Impedance: 64 Ohm
Implantable Lead Implant Date: 20110503
Implantable Lead Implant Date: 20130815
Implantable Lead Location: 753859
Implantable Lead Location: 753860
Implantable Pulse Generator Implant Date: 20110503
Lead Channel Impedance Value: 250 Ohm
Lead Channel Impedance Value: 360 Ohm
Lead Channel Pacing Threshold Amplitude: 0.5 V
Lead Channel Pacing Threshold Amplitude: 1.75 V
Lead Channel Pacing Threshold Pulse Width: 0.5 ms
Lead Channel Pacing Threshold Pulse Width: 1.3 ms
Lead Channel Sensing Intrinsic Amplitude: 11.4 mV
Lead Channel Sensing Intrinsic Amplitude: 2.1 mV
Lead Channel Setting Pacing Amplitude: 2 V
Lead Channel Setting Pacing Amplitude: 3 V
Lead Channel Setting Pacing Pulse Width: 1.3 ms
Lead Channel Setting Sensing Sensitivity: 0.5 mV
Pulse Gen Serial Number: 778164

## 2020-03-22 NOTE — Progress Notes (Signed)
Remote ICD transmission.   

## 2020-03-22 NOTE — Addendum Note (Signed)
Addended by: Cheri Kearns A on: 03/22/2020 12:20 PM   Modules accepted: Level of Service

## 2020-03-26 ENCOUNTER — Other Ambulatory Visit: Payer: Self-pay | Admitting: Hematology & Oncology

## 2020-03-28 ENCOUNTER — Other Ambulatory Visit: Payer: Self-pay | Admitting: Hematology & Oncology

## 2020-03-28 DIAGNOSIS — F419 Anxiety disorder, unspecified: Secondary | ICD-10-CM

## 2020-03-28 DIAGNOSIS — D3910 Neoplasm of uncertain behavior of unspecified ovary: Secondary | ICD-10-CM

## 2020-03-30 ENCOUNTER — Other Ambulatory Visit: Payer: 59

## 2020-03-30 ENCOUNTER — Other Ambulatory Visit: Payer: Self-pay

## 2020-03-30 ENCOUNTER — Other Ambulatory Visit (HOSPITAL_COMMUNITY)
Admission: RE | Admit: 2020-03-30 | Discharge: 2020-03-30 | Disposition: A | Discharge status: Home / Self Care | Payer: 59 | Source: Ambulatory Visit | Attending: Internal Medicine | Admitting: Internal Medicine

## 2020-03-30 ENCOUNTER — Ambulatory Visit (INDEPENDENT_AMBULATORY_CARE_PROVIDER_SITE_OTHER): Payer: 59 | Admitting: *Deleted

## 2020-03-30 DIAGNOSIS — Z20822 Contact with and (suspected) exposure to covid-19: Secondary | ICD-10-CM | POA: Insufficient documentation

## 2020-03-30 DIAGNOSIS — Z01812 Encounter for preprocedural laboratory examination: Secondary | ICD-10-CM | POA: Insufficient documentation

## 2020-03-30 DIAGNOSIS — I480 Paroxysmal atrial fibrillation: Secondary | ICD-10-CM | POA: Diagnosis not present

## 2020-03-30 DIAGNOSIS — I4729 Other ventricular tachycardia: Secondary | ICD-10-CM

## 2020-03-30 DIAGNOSIS — Z953 Presence of xenogenic heart valve: Secondary | ICD-10-CM

## 2020-03-30 DIAGNOSIS — Z5181 Encounter for therapeutic drug level monitoring: Secondary | ICD-10-CM

## 2020-03-30 DIAGNOSIS — Z952 Presence of prosthetic heart valve: Secondary | ICD-10-CM

## 2020-03-30 DIAGNOSIS — I5022 Chronic systolic (congestive) heart failure: Secondary | ICD-10-CM

## 2020-03-30 DIAGNOSIS — I472 Ventricular tachycardia: Secondary | ICD-10-CM

## 2020-03-30 DIAGNOSIS — Z95810 Presence of automatic (implantable) cardiac defibrillator: Secondary | ICD-10-CM

## 2020-03-30 LAB — BASIC METABOLIC PANEL
BUN/Creatinine Ratio: 10 — ABNORMAL LOW (ref 12–28)
BUN: 11 mg/dL (ref 8–27)
CO2: 26 mmol/L (ref 20–29)
Calcium: 9.3 mg/dL (ref 8.7–10.3)
Chloride: 103 mmol/L (ref 96–106)
Creatinine, Ser: 1.07 mg/dL — ABNORMAL HIGH (ref 0.57–1.00)
GFR calc Af Amer: 65 mL/min/{1.73_m2} (ref >59)
GFR calc non Af Amer: 57 mL/min/{1.73_m2} — ABNORMAL LOW (ref >59)
Glucose: 91 mg/dL (ref 65–99)
Potassium: 3.7 mmol/L (ref 3.5–5.2)
Sodium: 138 mmol/L (ref 134–144)

## 2020-03-30 LAB — CBC WITH DIFFERENTIAL/PLATELET
Basophils Absolute: 0 10*3/uL (ref 0.0–0.2)
Basos: 0 %
EOS (ABSOLUTE): 0.2 10*3/uL (ref 0.0–0.4)
Eos: 2 %
Hematocrit: 33 % — ABNORMAL LOW (ref 34.0–46.6)
Hemoglobin: 11 g/dL — ABNORMAL LOW (ref 11.1–15.9)
Lymphocytes Absolute: 0.8 10*3/uL (ref 0.7–3.1)
Lymphs: 11 %
MCH: 28.5 pg (ref 26.6–33.0)
MCHC: 33.3 g/dL (ref 31.5–35.7)
MCV: 86 fL (ref 79–97)
Monocytes Absolute: 0.8 10*3/uL (ref 0.1–0.9)
Monocytes: 11 %
Neutrophils Absolute: 5.5 10*3/uL (ref 1.4–7.0)
Neutrophils: 76 %
Platelets: 204 10*3/uL (ref 150–450)
RBC: 3.86 x10E6/uL (ref 3.77–5.28)
RDW: 16.9 % — ABNORMAL HIGH (ref 11.7–15.4)
WBC: 7.2 10*3/uL (ref 3.4–10.8)

## 2020-03-30 LAB — POCT INR: INR: 3 (ref 2.0–3.0)

## 2020-03-30 LAB — SARS CORONAVIRUS 2 (TAT 6-24 HRS): SARS Coronavirus 2: NEGATIVE

## 2020-03-30 NOTE — Patient Instructions (Signed)
Description   Follow instructions for upcoming procedure on Monday. Continue taking 1 tablet (7.5mg ) daily except 1/2 tablet (3.75mg ) on Mondays and Fridays. Recheck INR in 1 week post procedure. Continue normal dark green consistency. Coumadin Clinic [phone] Main [phone]    After procedure take an extra 1/2 tablet for 2 days then resume normal dose.

## 2020-03-31 NOTE — Progress Notes (Signed)
Attempted to call patient regarding procedure instructions for tomorrow.  Left message to arrive @ 930, nothing to eat of drink after midnight, last dose of coumadin tonight.  Need responsible adult to drive her home and stay with her for 24 hrs

## 2020-04-03 ENCOUNTER — Other Ambulatory Visit: Payer: Self-pay

## 2020-04-03 ENCOUNTER — Ambulatory Visit (HOSPITAL_COMMUNITY)
Admission: RE | Admit: 2020-04-03 | Discharge: 2020-04-03 | Disposition: A | Discharge status: Home / Self Care | Payer: 59 | Source: Ambulatory Visit | Attending: Internal Medicine | Admitting: Internal Medicine

## 2020-04-03 ENCOUNTER — Ambulatory Visit (HOSPITAL_COMMUNITY)
Admission: RE | Disposition: A | Discharge status: Home / Self Care | Payer: 59 | Source: Ambulatory Visit | Attending: Internal Medicine

## 2020-04-03 DIAGNOSIS — Z8543 Personal history of malignant neoplasm of ovary: Secondary | ICD-10-CM | POA: Diagnosis not present

## 2020-04-03 DIAGNOSIS — I5022 Chronic systolic (congestive) heart failure: Secondary | ICD-10-CM | POA: Diagnosis not present

## 2020-04-03 DIAGNOSIS — Z79899 Other long term (current) drug therapy: Secondary | ICD-10-CM | POA: Diagnosis not present

## 2020-04-03 DIAGNOSIS — Z8551 Personal history of malignant neoplasm of bladder: Secondary | ICD-10-CM | POA: Insufficient documentation

## 2020-04-03 DIAGNOSIS — Z8249 Family history of ischemic heart disease and other diseases of the circulatory system: Secondary | ICD-10-CM | POA: Insufficient documentation

## 2020-04-03 DIAGNOSIS — I472 Ventricular tachycardia: Secondary | ICD-10-CM | POA: Insufficient documentation

## 2020-04-03 DIAGNOSIS — M1990 Unspecified osteoarthritis, unspecified site: Secondary | ICD-10-CM | POA: Diagnosis not present

## 2020-04-03 DIAGNOSIS — Z951 Presence of aortocoronary bypass graft: Secondary | ICD-10-CM | POA: Diagnosis not present

## 2020-04-03 DIAGNOSIS — F17210 Nicotine dependence, cigarettes, uncomplicated: Secondary | ICD-10-CM | POA: Diagnosis not present

## 2020-04-03 DIAGNOSIS — Z006 Encounter for examination for normal comparison and control in clinical research program: Secondary | ICD-10-CM | POA: Insufficient documentation

## 2020-04-03 DIAGNOSIS — I2510 Atherosclerotic heart disease of native coronary artery without angina pectoris: Secondary | ICD-10-CM | POA: Insufficient documentation

## 2020-04-03 DIAGNOSIS — E039 Hypothyroidism, unspecified: Secondary | ICD-10-CM | POA: Diagnosis not present

## 2020-04-03 DIAGNOSIS — I480 Paroxysmal atrial fibrillation: Secondary | ICD-10-CM | POA: Insufficient documentation

## 2020-04-03 DIAGNOSIS — G629 Polyneuropathy, unspecified: Secondary | ICD-10-CM | POA: Insufficient documentation

## 2020-04-03 DIAGNOSIS — I255 Ischemic cardiomyopathy: Secondary | ICD-10-CM | POA: Insufficient documentation

## 2020-04-03 DIAGNOSIS — Z888 Allergy status to other drugs, medicaments and biological substances status: Secondary | ICD-10-CM | POA: Insufficient documentation

## 2020-04-03 DIAGNOSIS — N183 Chronic kidney disease, stage 3 unspecified: Secondary | ICD-10-CM | POA: Diagnosis not present

## 2020-04-03 DIAGNOSIS — Z90722 Acquired absence of ovaries, bilateral: Secondary | ICD-10-CM | POA: Insufficient documentation

## 2020-04-03 DIAGNOSIS — Z90710 Acquired absence of both cervix and uterus: Secondary | ICD-10-CM | POA: Insufficient documentation

## 2020-04-03 DIAGNOSIS — Z96641 Presence of right artificial hip joint: Secondary | ICD-10-CM | POA: Diagnosis not present

## 2020-04-03 DIAGNOSIS — E7800 Pure hypercholesterolemia, unspecified: Secondary | ICD-10-CM | POA: Diagnosis not present

## 2020-04-03 DIAGNOSIS — Z7982 Long term (current) use of aspirin: Secondary | ICD-10-CM | POA: Diagnosis not present

## 2020-04-03 DIAGNOSIS — Z4502 Encounter for adjustment and management of automatic implantable cardiac defibrillator: Secondary | ICD-10-CM | POA: Insufficient documentation

## 2020-04-03 DIAGNOSIS — I252 Old myocardial infarction: Secondary | ICD-10-CM | POA: Insufficient documentation

## 2020-04-03 DIAGNOSIS — Z7901 Long term (current) use of anticoagulants: Secondary | ICD-10-CM | POA: Diagnosis not present

## 2020-04-03 DIAGNOSIS — Z952 Presence of prosthetic heart valve: Secondary | ICD-10-CM | POA: Insufficient documentation

## 2020-04-03 HISTORY — PX: ICD GENERATOR CHANGEOUT: EP1231

## 2020-04-03 LAB — PROTIME-INR
INR: 1.5 — ABNORMAL HIGH (ref 0.8–1.2)
Prothrombin Time: 17.7 seconds — ABNORMAL HIGH (ref 11.4–15.2)

## 2020-04-03 SURGERY — ICD GENERATOR CHANGEOUT

## 2020-04-03 MED ORDER — FENTANYL CITRATE (PF) 100 MCG/2ML IJ SOLN
INTRAMUSCULAR | Status: DC | PRN
Start: 2020-04-03 — End: 2020-04-03
  Administered 2020-04-03: 25 ug via INTRAVENOUS

## 2020-04-03 MED ORDER — MIDAZOLAM HCL 5 MG/5ML IJ SOLN
INTRAMUSCULAR | Status: AC
  Filled 2020-04-03: qty 5

## 2020-04-03 MED ORDER — SODIUM CHLORIDE 0.9 % IV SOLN: INTRAVENOUS | Status: DC

## 2020-04-03 MED ORDER — MIDAZOLAM HCL 5 MG/5ML IJ SOLN
INTRAMUSCULAR | Status: DC | PRN
  Administered 2020-04-03: 2 mg via INTRAVENOUS

## 2020-04-03 MED ORDER — CHLORHEXIDINE GLUCONATE 4 % EX LIQD: 4.0000 "application " | Freq: Once | CUTANEOUS | Status: DC

## 2020-04-03 MED ORDER — SODIUM CHLORIDE 0.9 % IV SOLN
INTRAVENOUS | Status: AC
  Filled 2020-04-03: qty 2

## 2020-04-03 MED ORDER — CEFAZOLIN SODIUM-DEXTROSE 2-4 GM/100ML-% IV SOLN
INTRAVENOUS | Status: AC
  Filled 2020-04-03: qty 100

## 2020-04-03 MED ORDER — LIDOCAINE HCL (PF) 1 % IJ SOLN
INTRAMUSCULAR | Status: DC | PRN
  Administered 2020-04-03: 50 mL

## 2020-04-03 MED ORDER — CEFAZOLIN SODIUM-DEXTROSE 2-4 GM/100ML-% IV SOLN
2.0000 g | INTRAVENOUS | Status: AC
  Administered 2020-04-03: 2 g via INTRAVENOUS

## 2020-04-03 MED ORDER — FENTANYL CITRATE (PF) 100 MCG/2ML IJ SOLN
INTRAMUSCULAR | Status: AC
  Filled 2020-04-03: qty 2

## 2020-04-03 MED ORDER — LIDOCAINE HCL 1 % IJ SOLN
INTRAMUSCULAR | Status: AC
  Filled 2020-04-03: qty 60

## 2020-04-03 MED ORDER — SODIUM CHLORIDE 0.9 % IV SOLN
80.0000 mg | INTRAVENOUS | Status: AC
  Administered 2020-04-03: 80 mg

## 2020-04-03 SURGICAL SUPPLY — 4 items
CABLE SURGICAL S-101-97-12 (CABLE) ×2 IMPLANT
ICD GALLANT DR CDDRA500Q (ICD Generator) ×1 IMPLANT
PAD PRO RADIOLUCENT 2001M-C (PAD) ×2 IMPLANT
TRAY PACEMAKER INSERTION (PACKS) ×2 IMPLANT

## 2020-04-03 NOTE — H&P (Signed)
HPI [name] returns today for ICD followup. She is a pleasant 59 yo woman with a h/o chronic systolic heart failure, s/p MV repair who developed mitral stenosis and underwent transcatheter mitral valve replacement over a year ago. Her symptoms are much improved. She denies chest pain or sob. She has reached ERI. She has not had any ICD therapies. Her EF was 40% at last check. She has a h/o VT cardiac arrest.  Allergies  Allergen Reactions  . Prochlorperazine Edisylate Other (See Comments)    Nervous/ flutter/ shakes  . Adhesive [Tape] Hives    Redness/hives from adhesive tape( tolerates latex gloves); tolerates paper tape           Current Outpatient Medications  Medication Sig Dispense Refill  . albuterol (VENTOLIN HFA) 108 (90 Base) MCG/ACT inhaler Inhale 2 puffs into the lungs every 6 (six) hours as needed for wheezing or shortness of breath. 8 g 1  . amoxicillin (AMOXIL) 500 MG tablet Take 2,000 mg by mouth. Take 2,000 mg one hour prior to dental visits.    Marland Kitchen aspirin EC 81 MG tablet Take 81 mg by mouth daily.    . carvedilol (COREG) 3.125 MG tablet Take 1 tablet (3.125 mg total) by mouth 2 (two) times daily with a meal. 180 tablet 1  . furosemide (LASIX) 20 MG tablet TAKE 1 TABLET BY MOUTH EVERY DAY 90 tablet 2  . lidocaine-prilocaine (EMLA) cream Apply 1 application topically as needed. Apply to port per instructions 30 g 0  . LORazepam (ATIVAN) 0.5 MG tablet TAKE 1 TABLET (0.5 MG TOTAL) BY MOUTH EVERY 6 (SIX) HOURS AS NEEDED. FOR ANXIETY 60 tablet 0  . methocarbamol (ROBAXIN) 500 MG tablet TAKE 1 TABLET (500 MG TOTAL) BY MOUTH EVERY 6 (SIX) HOURS AS NEEDED FOR MUSCLE SPASMS. 60 tablet 2  . Potassium Chloride ER 20 MEQ TBCR Take 40 mEq by mouth daily. With extra as needed. Please take 4 tablets ( 80 meq total) ONE TIME ONLY on 06/12/19) 75 tablet 3  . traMADol (ULTRAM) 50 MG tablet TAKE 1 TABLET (50 MG TOTAL) BY MOUTH EVERY 6 (SIX) HOURS AS NEEDED. 60 tablet 0    . Vitamin D, Ergocalciferol, (DRISDOL) 1.25 MG (50000 UNIT) CAPS capsule TAKE 1 CAPSULE (50,000 UNITS TOTAL) BY MOUTH EVERY 7 (SEVEN) DAYS. 12 capsule 3  . warfarin (COUMADIN) 7.5 MG tablet Take as directed by Anticoagulation Clinic. 90 tablet 1  . zolpidem (AMBIEN) 5 MG tablet TAKE 1 TABLET BY MOUTH EVERYDAY AT BEDTIME 30 tablet 0  . spironolactone (ALDACTONE) 25 MG tablet Take 1 tablet (25 mg total) by mouth daily. 90 tablet 3   No current facility-administered medications for this visit.            Facility-Administered Medications Ordered in Other Visits  Medication Dose Route Frequency Provider Last Rate Last Admin  . sodium chloride flush (NS) 0.9 % injection 10 mL  10 mL Intravenous PRN Volanda Napoleon, MD   10 mL at 09/14/18 1150         Past Medical History:  Diagnosis Date  . Arthritis    "everywhere"  . Asthma    as a child  . Automatic implantable cardioverter-defibrillator in situ    DR. Beckie Salts   . Bladder cancer [facility]) 2009   "injected medicine to get rid of it"  . Chronic systolic CHF (congestive heart failure) ([place])   . CKD (chronic kidney disease), stage III   . Coronary  artery disease    a. large RV/inferior MI complicated by pseudoaneurysm s/p aneurysemectomy and CABG 10/2009 with papillary muscle rupture requiring bioprosthetic mitral valve replacement in [facility].  . Granulosa cell carcinoma of ovary ([place])    recurrent - surgical resection 2007, 2014 and 2016   . H/O thymectomy   . High cholesterol   . Hypothyroidism   . ICD (implantable cardiac defibrillator) in place   . Ischemic cardiomyopathy    a.  ICM and VT arrest 10/2009 s/p AICD (revision 01/2012 due to RV lead problem).  . Myocardial infarction ([place]) 10/14/2009  . Neuropathy    FEET AND HANDS - FROM CHEMO - BUT MUCH IMPROVED  . PAF (paroxysmal atrial fibrillation) ([place])   . Pain    JOINT PAINS AND MUSCLE ACHES ALL OVER.  Marland Kitchen Port-A-Cath in place    RIGHT  UPPER CHEST  . Prosthetic valve dysfunction   . S/P mitral valve replacement with bioprosthetic valve 10/21/2009   Edwards Perimount stented bovine pericardial tissue valve, size 29 mm  . S/P valve-in-valve transcatheter mitral valve replacement 09/16/2017   29 mm Edwards Sapien 3 transcatheter heart valve placed via transapical approach  . SVT (supraventricular tachycardia) ([place])   . Tobacco abuse   . Ventricular tachycardia ([place])     ROS:   All systems reviewed and negative except as noted in the HPI.        Past Surgical History:  Procedure Laterality Date  . ABDOMINAL HYSTERECTOMY N/A 07/27/2013   Procedure: TUMOR EXCISION OF ABDOMINAL MASS;  Surgeon: Alvino Chapel, MD;  Location: [facility];  Service: Gynecology;  Laterality: N/A;  . APPENDECTOMY  1989  . BILATERAL OOPHORECTOMY  2007  . CARDIAC DEFIBRILLATOR PLACEMENT  02/06/2012   "lead change"  . CARDIAC VALVE REPLACEMENT    . CYSTOSCOPY Right 02/23/2013   Procedure: CYSTOSCOPY WITH STENT REMOVAL;  Surgeon: Alvino Chapel, MD;  Location: [facility];  Service: Gynecology;  Laterality: Right;  . IMPLANTABLE CARDIOVERTER DEFIBRILLATOR REVISION N/A 02/06/2012   Procedure: IMPLANTABLE CARDIOVERTER DEFIBRILLATOR REVISION;  Surgeon: Evans Lance, MD;  Location: [facility] CATH LAB;  Service: Cardiovascular;  Laterality: N/A;  . INSERT / REPLACE / REMOVE PACEMAKER  10/2009   DUAL-CHAMBER [place] DEFIBRILLATOR  . IR IMAGING GUIDED PORT INSERTION  05/11/2018  . LAPAROTOMY N/A 01/19/2013   Procedure: TUMOR DEBULKING / BOWEL RESECTION/INSERTION RIGHT UTERERAL DOUBLE J STENT/OMENTECTOMY;  Surgeon: Alvino Chapel, MD;  Location: [facility];  Service: Gynecology;  Laterality: N/A;  . MITRAL VALVE REPLACEMENT  10/21/2009   75mm Edwards Perimount bovine pericardial tissue valve - [place]  . RIGHT/LEFT HEART CATH AND CORONARY ANGIOGRAPHY N/A 07/02/2017   Procedure: RIGHT/LEFT HEART CATH AND CORONARY  ANGIOGRAPHY;  Surgeon: Belva Crome, MD;  Location: [place] CV LAB;  Service: Cardiovascular;  Laterality: N/A;  . TEE WITHOUT CARDIOVERSION N/A 09/16/2017   Procedure: TRANSESOPHAGEAL ECHOCARDIOGRAM (TEE);  Surgeon: [name], MD;  Location: [place];  Service: Open Heart Surgery;  Laterality: N/A;  . THYMECTOMY  2009  . TONSILLECTOMY AND ADENOIDECTOMY  ~ 1967  . TOTAL HIP ARTHROPLASTY  05/06/2012   Procedure: TOTAL HIP ARTHROPLASTY;  Surgeon: Gearlean Alf, MD;  Location: [facility];  Service: Orthopedics;  Laterality: Right;  . TRANSCATHETER MITRAL VALVE REPLACEMENT, TRANSAPICAL N/A 09/16/2017   Procedure: TRANSCATHETER MITRAL VALVE REPLACEMENT,TRANSAPICAL;  Surgeon: [name], MD;  Location: [place];  Service: Open Heart Surgery;  Laterality: N/A;  . TRANSURETHRAL RESECTION OF BLADDER  2009   "  for bladder cancer"  . TUBAL LIGATION  1993  . VAGINAL HYSTERECTOMY  2001          Family History  Problem Relation Age of Onset  . Heart attack Paternal Grandfather   . Heart attack Maternal Grandfather   . Stroke Maternal Grandmother   . Heart attack Brother      Social History        Socioeconomic History  . Marital status: Divorced    Spouse name: Not on file  . Number of children: Not on file  . Years of education: Not on file  . Highest education level: Not on file  Occupational History    Comment: WORKS FULL TIME  Tobacco Use  . Smoking status: Current Every Day Smoker    Packs/day: 0.50    Years: 30.00    Pack years: 15.00    Types: Cigarettes    Start date: 03/28/1974  . Smokeless tobacco: Never Used  Vaping Use  . Vaping Use: Never used  Substance and Sexual Activity  . Alcohol use: No    Alcohol/week: 0.0 standard drinks  . Drug use: No  . Sexual activity: Not on file  Other Topics Concern  . Not on file  Social History Narrative   WORKS FULL TIME   SINGLE   TOBACCO USE-YES   IMPLANTATION OF DUAL-CHAMBER  [place]   Social Determinants of Health      Financial Resource Strain:   . Difficulty of Paying Living Expenses: Not on file  Food Insecurity:   . Worried About Charity fundraiser in the Last Year: Not on file  . Ran Out of Food in the Last Year: Not on file  Transportation Needs:   . Lack of Transportation (Medical): Not on file  . Lack of Transportation (Non-Medical): Not on file  Physical Activity:   . Days of Exercise per Week: Not on file  . Minutes of Exercise per Session: Not on file  Stress:   . Feeling of Stress : Not on file  Social Connections:   . Frequency of Communication with Friends and Family: Not on file  . Frequency of Social Gatherings with Friends and Family: Not on file  . Attends Religious Services: Not on file  . Active Member of Clubs or Organizations: Not on file  . Attends Archivist Meetings: Not on file  . Marital Status: Not on file  Intimate Partner Violence:   . Fear of Current or Ex-Partner: Not on file  . Emotionally Abused: Not on file  . Physically Abused: Not on file  . Sexually Abused: Not on file     BP 102/62   Pulse 96   Ht 5\' 6"  (1.676 m)   Wt 181 lb (82.1 kg)   SpO2 97%   BMI 29.21 kg/m   Physical Exam:  Well appearing NAD HEENT: Unremarkable Neck:  No JVD, no thyromegally Lymphatics:  No adenopathy Back:  No CVA tenderness Lungs:  Clear with no wheezes HEART:  Regular rate rhythm, no murmurs, no rubs, no clicks Abd:  soft, positive bowel sounds, no organomegally, no rebound, no guarding Ext:  2 plus pulses, no edema, no cyanosis, no clubbing Skin:  No rashes no nodules Neuro:  CN II through XII intact, motor grossly intact  EKG - nsr with inferior MI  DEVICE  Normal device function.  See PaceArt for details. ERI  Assess/Plan: 1. VT - she has not had any additional episodes. 2. ICD -  her [place] ICD is working normally. She has reached ERI. We will schedule ICD gen change  out. 3. Atrial lead malfunction -she has a low pacing impedence but p waves and the atrial threshold are still good. As she does not pace much, I have recommended that we not remove or add a new atrial lead. 4. Chronic systolic heart failure - her symptoms are class 2. She has an EF of 40% by echo. She will continue her current meds  Salome Spotted  EP attending  Patient seen and examined.  Since last clinic visit, no change.  We will proceed with removal of a previously implanted Garvin ICD which had reached elective replacement, and inserted a new Follett ICD in a patient with ventricular tachycardia and chronic systolic heart failure.  Cristopher Peru, MD

## 2020-04-03 NOTE — Progress Notes (Signed)
Discharge instructions reviewed with pt and her son (via telephone) both voice understanding.  

## 2020-04-03 NOTE — Discharge Instructions (Signed)
Pacemaker Battery Change, Care After This sheet gives you information about how to care for yourself after your procedure. Your health care provider may also give you more specific instructions. If you have problems or questions, contact your health care provider. What can I expect after the procedure? After your procedure, it is common to have:  Pain or soreness at the site where the pacemaker was inserted.  Swelling at the site where the pacemaker was inserted. Follow these instructions at home: Incision care   Keep the incision clean and dry. ? Do not take baths, swim, or use a hot tub until your health care provider approves. ? You may shower the day after your procedure, or as directed by your health care provider. ? Pat the area dry with a clean towel. Do not rub the area. This may cause bleeding.  Follow instructions from your health care provider about how to take care of your incision. Make sure you: ? Wash your hands with soap and water before you change your bandage (dressing). If soap and water are not available, use hand sanitizer. ? Change your dressing as told by your health care provider. ? Leave stitches (sutures), skin glue, or adhesive strips in place. These skin closures may need to stay in place for 2 weeks or longer. If adhesive strip edges start to loosen and curl up, you may trim the loose edges. Do not remove adhesive strips completely unless your health care provider tells you to do that.  Check your incision area every day for signs of infection. Check for: ? More redness, swelling, or pain. ? More fluid or blood. ? Warmth. ? Pus or a bad smell. Activity  Do not lift anything that is heavier than 10 lb (4.5 kg) until your health care provider says it is okay to do so.  For the first 2 weeks, or as long as told by your health care provider: ? Avoid lifting your left arm higher than your shoulder. ? Be gentle when you move your arms over your head. It is okay  to raise your arm to comb your hair. ? Avoid strenuous exercise.  Ask your health care provider when it is okay to: ? Resume your normal activities. ? Return to work or school. ? Resume sexual activity. Eating and drinking  Eat a heart-healthy diet. This should include plenty of fresh fruits and vegetables, whole grains, low-fat dairy products, and lean protein like chicken and fish.  Limit alcohol intake to no more than 1 drink a day for non-pregnant women and 2 drinks a day for men. One drink equals 12 oz of beer, 5 oz of wine, or 1 oz of hard liquor.  Check ingredients and nutrition facts on packaged foods and beverages. Avoid the following types of food: ? Food that is high in salt (sodium). ? Food that is high in saturated fat, like full-fat dairy or red meat. ? Food that is high in trans fat, like fried food. ? Food and drinks that are high in sugar. Lifestyle  Do not use any products that contain nicotine or tobacco, such as cigarettes and e-cigarettes. If you need help quitting, ask your health care provider.  Take steps to manage and control your weight.  Get regular exercise. Aim for 150 minutes of moderate-intensity exercise (such as walking or yoga) or 75 minutes of vigorous exercise (such as running or swimming) each week.  Manage other health problems, such as diabetes or high blood pressure. Ask your health  care provider how you can manage these conditions. General instructions  Do not drive for 24 hours after your procedure if you were given a medicine to help you relax (sedative).  Take over-the-counter and prescription medicines only as told by your health care provider.  Avoid putting pressure on the area where the pacemaker was placed.  If you need an MRI after your pacemaker has been placed, be sure to tell the health care provider who orders the MRI that you have a pacemaker.  Avoid close and prolonged exposure to electrical devices that have strong  magnetic fields. These include: ? Cell phones. Avoid keeping them in a pocket near the pacemaker, and try using the ear opposite the pacemaker. ? MP3 players. ? Household appliances, like microwaves. ? Metal detectors. ? Electric generators. ? High-tension wires.  Keep all follow-up visits as directed by your health care provider. This is important. Contact a health care provider if:  You have pain at the incision site that is not relieved by over-the-counter or prescription medicines.  You have any of these around your incision site or coming from it: ? More redness, swelling, or pain. ? Fluid or blood. ? Warmth to the touch. ? Pus or a bad smell.  You have a fever.  You feel brief, occasional palpitations, light-headedness, or any symptoms that you think might be related to your heart. Get help right away if:  You experience chest pain that is different from the pain at the pacemaker site.  You develop a red streak that extends above or below the incision site.  You experience shortness of breath.  You have palpitations or an irregular heartbeat.  You have light-headedness that does not go away quickly.  You faint or have dizzy spells.  Your pulse suddenly drops or increases rapidly and does not return to normal.  You begin to gain weight and your legs and ankles swell. Summary  After your procedure, it is common to have pain, soreness, and some swelling where the pacemaker was inserted.  Make sure to keep your incision clean and dry. Follow instructions from your health care provider about how to take care of your incision.  Check your incision every day for signs of infection, such as more pain or swelling, pus or a bad smell, warmth, or leaking fluid and blood.  Avoid strenuous exercise and lifting your left arm higher than your shoulder for 2 weeks, or as long as told by your health care provider. This information is not intended to replace advice given to you by  your health care provider. Make sure you discuss any questions you have with your health care provider. Document Revised: 05/23/2017 Document Reviewed: 05/02/2016 Elsevier Patient Education  2020 [employer].

## 2020-04-04 ENCOUNTER — Encounter (HOSPITAL_COMMUNITY): Payer: Self-pay | Admitting: Internal Medicine

## 2020-04-04 MED FILL — Lidocaine HCl Local Inj 1%: INTRAMUSCULAR | Qty: 60 | Status: AC

## 2020-04-13 ENCOUNTER — Ambulatory Visit (INDEPENDENT_AMBULATORY_CARE_PROVIDER_SITE_OTHER): Payer: 59 | Admitting: Emergency Medicine

## 2020-04-13 ENCOUNTER — Ambulatory Visit (INDEPENDENT_AMBULATORY_CARE_PROVIDER_SITE_OTHER): Payer: 59 | Admitting: Pharmacist

## 2020-04-13 ENCOUNTER — Other Ambulatory Visit: Payer: Self-pay

## 2020-04-13 DIAGNOSIS — Z952 Presence of prosthetic heart valve: Secondary | ICD-10-CM | POA: Diagnosis not present

## 2020-04-13 DIAGNOSIS — I480 Paroxysmal atrial fibrillation: Secondary | ICD-10-CM | POA: Diagnosis not present

## 2020-04-13 DIAGNOSIS — I4901 Ventricular fibrillation: Secondary | ICD-10-CM

## 2020-04-13 DIAGNOSIS — Z953 Presence of xenogenic heart valve: Secondary | ICD-10-CM

## 2020-04-13 LAB — CUP PACEART INCLINIC DEVICE CHECK
Battery Remaining Longevity: 106 mo
Brady Statistic RA Percent Paced: 0 %
Brady Statistic RV Percent Paced: 0.03 %
Date Time Interrogation Session: 20211021114308
HighPow Impedance: 57.375
Implantable Lead Implant Date: 20110503
Implantable Lead Implant Date: 20130815
Implantable Lead Location: 753859
Implantable Lead Location: 753860
Implantable Pulse Generator Implant Date: 20211012
Lead Channel Impedance Value: 262.5 Ohm
Lead Channel Impedance Value: 387.5 Ohm
Lead Channel Pacing Threshold Amplitude: 0.5 V
Lead Channel Pacing Threshold Amplitude: 0.5 V
Lead Channel Pacing Threshold Amplitude: 2 V
Lead Channel Pacing Threshold Pulse Width: 0.5 ms
Lead Channel Pacing Threshold Pulse Width: 0.5 ms
Lead Channel Pacing Threshold Pulse Width: 1 ms
Lead Channel Sensing Intrinsic Amplitude: 11.9 mV
Lead Channel Sensing Intrinsic Amplitude: 2.5 mV
Lead Channel Setting Pacing Amplitude: 2 V
Lead Channel Setting Pacing Amplitude: 2.25 V
Lead Channel Setting Pacing Pulse Width: 1 ms
Lead Channel Setting Sensing Sensitivity: 0.5 mV
Pulse Gen Serial Number: 111028151

## 2020-04-13 LAB — POCT INR: INR: 2.8 (ref 2.0–3.0)

## 2020-04-13 NOTE — Patient Instructions (Signed)
Continue taking 1 tablet (7.5mg ) daily except 1/2 tablet (3.75mg ) on Mondays and Fridays. Recheck INR in 4 week post procedure. Continue normal dark green consistency. [facility]in [facility] [phone] Main [phone]

## 2020-04-13 NOTE — Progress Notes (Signed)
Wound check appointment. Steri-strips removed. Wound without redness or edema. Incision edges approximated, wound well healed. Normal device function. Thresholds, sensing, and impedances consistent with implant measurements. Device outputs programmed appropriately for chronic leads within safety margin. Histogram distribution appropriate for patient and level of activity. No mode switches or ventricular arrhythmias noted. Patient educated about wound care, arm mobility, lifting restrictions, shock plan.  Patient is enrolled in remote monitoring, next scheduled check 07/04/20.  ROV with Dr. Lovena Le on 07/04/20.

## 2020-04-15 ENCOUNTER — Other Ambulatory Visit: Payer: Self-pay | Admitting: Hematology & Oncology

## 2020-04-15 DIAGNOSIS — D3910 Neoplasm of uncertain behavior of unspecified ovary: Secondary | ICD-10-CM

## 2020-04-18 ENCOUNTER — Other Ambulatory Visit: Payer: Self-pay | Admitting: Hematology & Oncology

## 2020-04-18 DIAGNOSIS — F419 Anxiety disorder, unspecified: Secondary | ICD-10-CM

## 2020-04-18 DIAGNOSIS — D3910 Neoplasm of uncertain behavior of unspecified ovary: Secondary | ICD-10-CM

## 2020-04-21 ENCOUNTER — Encounter: Payer: Self-pay | Admitting: Hematology & Oncology

## 2020-05-09 ENCOUNTER — Other Ambulatory Visit: Payer: Self-pay | Admitting: Hematology & Oncology

## 2020-05-09 DIAGNOSIS — F419 Anxiety disorder, unspecified: Secondary | ICD-10-CM

## 2020-05-09 DIAGNOSIS — D3910 Neoplasm of uncertain behavior of unspecified ovary: Secondary | ICD-10-CM

## 2020-05-11 ENCOUNTER — Other Ambulatory Visit: Payer: Self-pay | Admitting: Hematology & Oncology

## 2020-05-11 ENCOUNTER — Ambulatory Visit (INDEPENDENT_AMBULATORY_CARE_PROVIDER_SITE_OTHER): Payer: 59 | Admitting: *Deleted

## 2020-05-11 ENCOUNTER — Other Ambulatory Visit: Payer: Self-pay

## 2020-05-11 DIAGNOSIS — Z952 Presence of prosthetic heart valve: Secondary | ICD-10-CM

## 2020-05-11 DIAGNOSIS — I480 Paroxysmal atrial fibrillation: Secondary | ICD-10-CM | POA: Diagnosis not present

## 2020-05-11 DIAGNOSIS — Z5181 Encounter for therapeutic drug level monitoring: Secondary | ICD-10-CM | POA: Diagnosis not present

## 2020-05-11 DIAGNOSIS — Z953 Presence of xenogenic heart valve: Secondary | ICD-10-CM

## 2020-05-11 LAB — POCT INR: INR: 2.5 (ref 2.0–3.0)

## 2020-05-11 NOTE — Patient Instructions (Signed)
Description   Continue taking 1 tablet (7.5mg ) daily except 1/2 tablet (3.75mg ) on Mondays and Fridays. Recheck INR in 4 weeks. Continue normal dark green consistency. Coumadin Clinic [phone] Main [phone]

## 2020-05-15 ENCOUNTER — Other Ambulatory Visit: Payer: Self-pay | Admitting: Hematology & Oncology

## 2020-05-15 DIAGNOSIS — D3910 Neoplasm of uncertain behavior of unspecified ovary: Secondary | ICD-10-CM

## 2020-05-22 ENCOUNTER — Inpatient Hospital Stay: Payer: 59

## 2020-05-22 ENCOUNTER — Inpatient Hospital Stay: Payer: 59 | Admitting: Hematology & Oncology

## 2020-05-24 ENCOUNTER — Other Ambulatory Visit: Payer: Self-pay | Admitting: Hematology & Oncology

## 2020-05-24 DIAGNOSIS — D3910 Neoplasm of uncertain behavior of unspecified ovary: Secondary | ICD-10-CM

## 2020-05-30 ENCOUNTER — Other Ambulatory Visit: Payer: Self-pay | Admitting: Interventional Cardiology

## 2020-06-01 ENCOUNTER — Other Ambulatory Visit: Payer: Self-pay | Admitting: Interventional Cardiology

## 2020-06-01 DIAGNOSIS — D3910 Neoplasm of uncertain behavior of unspecified ovary: Secondary | ICD-10-CM

## 2020-06-05 ENCOUNTER — Other Ambulatory Visit: Payer: Self-pay | Admitting: Hematology & Oncology

## 2020-06-05 DIAGNOSIS — F419 Anxiety disorder, unspecified: Secondary | ICD-10-CM

## 2020-06-08 ENCOUNTER — Other Ambulatory Visit: Payer: Self-pay | Admitting: Hematology & Oncology

## 2020-06-08 DIAGNOSIS — D3910 Neoplasm of uncertain behavior of unspecified ovary: Secondary | ICD-10-CM

## 2020-06-09 ENCOUNTER — Other Ambulatory Visit: Payer: Self-pay | Admitting: *Deleted

## 2020-06-09 DIAGNOSIS — D3910 Neoplasm of uncertain behavior of unspecified ovary: Secondary | ICD-10-CM

## 2020-06-09 MED ORDER — TRAMADOL HCL 50 MG PO TABS: 50.0000 mg | ORAL_TABLET | Freq: Four times a day (QID) | ORAL | 1 refills | Status: DC | PRN

## 2020-06-12 ENCOUNTER — Other Ambulatory Visit: Payer: Self-pay

## 2020-06-12 ENCOUNTER — Inpatient Hospital Stay: Payer: 59 | Attending: Hematology & Oncology

## 2020-06-12 ENCOUNTER — Telehealth: Payer: Self-pay | Admitting: Hematology & Oncology

## 2020-06-12 ENCOUNTER — Inpatient Hospital Stay (HOSPITAL_BASED_OUTPATIENT_CLINIC_OR_DEPARTMENT_OTHER): Payer: 59 | Admitting: Hematology & Oncology

## 2020-06-12 ENCOUNTER — Inpatient Hospital Stay: Payer: 59

## 2020-06-12 ENCOUNTER — Encounter: Payer: Self-pay | Admitting: Hematology & Oncology

## 2020-06-12 DIAGNOSIS — Z888 Allergy status to other drugs, medicaments and biological substances status: Secondary | ICD-10-CM | POA: Insufficient documentation

## 2020-06-12 DIAGNOSIS — C569 Malignant neoplasm of unspecified ovary: Secondary | ICD-10-CM | POA: Insufficient documentation

## 2020-06-12 DIAGNOSIS — Z7901 Long term (current) use of anticoagulants: Secondary | ICD-10-CM | POA: Diagnosis not present

## 2020-06-12 DIAGNOSIS — D3910 Neoplasm of uncertain behavior of unspecified ovary: Secondary | ICD-10-CM

## 2020-06-12 DIAGNOSIS — Z79899 Other long term (current) drug therapy: Secondary | ICD-10-CM | POA: Diagnosis not present

## 2020-06-12 DIAGNOSIS — Z95828 Presence of other vascular implants and grafts: Secondary | ICD-10-CM

## 2020-06-12 LAB — CBC WITH DIFFERENTIAL (CANCER CENTER ONLY)
Abs Immature Granulocytes: 0.03 10*3/uL (ref 0.00–0.07)
Basophils Absolute: 0.1 10*3/uL (ref 0.0–0.1)
Basophils Relative: 1 %
Eosinophils Absolute: 0.1 10*3/uL (ref 0.0–0.5)
Eosinophils Relative: 1 %
HCT: 33.8 % — ABNORMAL LOW (ref 36.0–46.0)
Hemoglobin: 10.7 g/dL — ABNORMAL LOW (ref 12.0–15.0)
Immature Granulocytes: 0 %
Lymphocytes Relative: 10 %
Lymphs Abs: 0.9 10*3/uL (ref 0.7–4.0)
MCH: 27.6 pg (ref 26.0–34.0)
MCHC: 31.7 g/dL (ref 30.0–36.0)
MCV: 87.3 fL (ref 80.0–100.0)
Monocytes Absolute: 0.9 10*3/uL (ref 0.1–1.0)
Monocytes Relative: 9 %
Neutro Abs: 7.5 10*3/uL (ref 1.7–7.7)
Neutrophils Relative %: 79 %
Platelet Count: 188 10*3/uL (ref 150–400)
RBC: 3.87 MIL/uL (ref 3.87–5.11)
RDW: 17 % — ABNORMAL HIGH (ref 11.5–15.5)
WBC Count: 9.5 10*3/uL (ref 4.0–10.5)
nRBC: 0 % (ref 0.0–0.2)

## 2020-06-12 LAB — CMP (CANCER CENTER ONLY)
ALT: 8 U/L (ref 0–44)
AST: 12 U/L — ABNORMAL LOW (ref 15–41)
Albumin: 4 g/dL (ref 3.5–5.0)
Alkaline Phosphatase: 50 U/L (ref 38–126)
Anion gap: 8 (ref 5–15)
BUN: 14 mg/dL (ref 6–20)
CO2: 29 mmol/L (ref 22–32)
Calcium: 9.2 mg/dL (ref 8.9–10.3)
Chloride: 101 mmol/L (ref 98–111)
Creatinine: 1.13 mg/dL — ABNORMAL HIGH (ref 0.44–1.00)
GFR, Estimated: 56 mL/min — ABNORMAL LOW (ref >60)
Glucose, Bld: 84 mg/dL (ref 70–99)
Potassium: 3.2 mmol/L — ABNORMAL LOW (ref 3.5–5.1)
Sodium: 138 mmol/L (ref 135–145)
Total Bilirubin: 0.3 mg/dL (ref 0.3–1.2)
Total Protein: 6.4 g/dL — ABNORMAL LOW (ref 6.5–8.1)

## 2020-06-12 MED ORDER — SODIUM CHLORIDE 0.9% FLUSH
10.0000 mL | INTRAVENOUS | Status: DC | PRN
  Administered 2020-06-12: 10 mL via INTRAVENOUS
  Filled 2020-06-12: qty 10

## 2020-06-12 MED ORDER — ZOLPIDEM TARTRATE 10 MG PO TABS: ORAL_TABLET | ORAL | 0 refills | Status: DC

## 2020-06-12 MED ORDER — HEPARIN SOD (PORK) LOCK FLUSH 100 UNIT/ML IV SOLN
500.0000 [IU] | Freq: Once | INTRAVENOUS | Status: AC
  Administered 2020-06-12: 500 [IU] via INTRAVENOUS
  Filled 2020-06-12: qty 5

## 2020-06-12 NOTE — Patient Instructions (Signed)
Implanted Port Insertion, Care After °This sheet gives you information about how to care for yourself after your procedure. Your health care provider may also give you more specific instructions. If you have problems or questions, contact your health care provider. °What can I expect after the procedure? °After the procedure, it is common to have: °· Discomfort at the port insertion site. °· Bruising on the skin over the port. This should improve over 3-4 days. °Follow these instructions at home: °Port care °· After your port is placed, you will get a manufacturer's information card. The card has information about your port. Keep this card with you at all times. °· Take care of the port as told by your health care provider. Ask your health care provider if you or a family member can get training for taking care of the port at home. A home health care nurse may also take care of the port. °· Make sure to remember what type of port you have. °Incision care ° °  ° °· Follow instructions from your health care provider about how to take care of your port insertion site. Make sure you: °? Wash your hands with soap and water before and after you change your bandage (dressing). If soap and water are not available, use hand sanitizer. °? Change your dressing as told by your health care provider. °? Leave stitches (sutures), skin glue, or adhesive strips in place. These skin closures may need to stay in place for 2 weeks or longer. If adhesive strip edges start to loosen and curl up, you may trim the loose edges. Do not remove adhesive strips completely unless your health care provider tells you to do that. °· Check your port insertion site every day for signs of infection. Check for: °? Redness, swelling, or pain. °? Fluid or blood. °? Warmth. °? Pus or a bad smell. °Activity °· Return to your normal activities as told by your health care provider. Ask your health care provider what activities are safe for you. °· Do not  lift anything that is heavier than 10 lb (4.5 kg), or the limit that you are told, until your health care provider says that it is safe. °General instructions °· Take over-the-counter and prescription medicines only as told by your health care provider. °· Do not take baths, swim, or use a hot tub until your health care provider approves. Ask your health care provider if you may take showers. You may only be allowed to take sponge baths. °· Do not drive for 24 hours if you were given a sedative during your procedure. °· Wear a medical alert bracelet in case of an emergency. This will tell any health care providers that you have a port. °· Keep all follow-up visits as told by your health care provider. This is important. °Contact a health care provider if: °· You cannot flush your port with saline as directed, or you cannot draw blood from the port. °· You have a fever or chills. °· You have redness, swelling, or pain around your port insertion site. °· You have fluid or blood coming from your port insertion site. °· Your port insertion site feels warm to the touch. °· You have pus or a bad smell coming from the port insertion site. °Get help right away if: °· You have chest pain or shortness of breath. °· You have bleeding from your port that you cannot control. °Summary °· Take care of the port as told by your health   care provider. Keep the manufacturer's information card with you at all times. °· Change your dressing as told by your health care provider. °· Contact a health care provider if you have a fever or chills or if you have redness, swelling, or pain around your port insertion site. °· Keep all follow-up visits as told by your health care provider. °This information is not intended to replace advice given to you by your health care provider. Make sure you discuss any questions you have with your health care provider. °Document Revised: 01/06/2018 Document Reviewed: 01/06/2018 °Elsevier Patient Education ©  2020 [employer] Inc[employer] ° °

## 2020-06-12 NOTE — Telephone Encounter (Signed)
Appointments scheduled patient will get update from My Chart per 12/20 los

## 2020-06-12 NOTE — Progress Notes (Signed)
Hematology and Oncology Follow Up Visit  [name] [id number] 02-Nov-1960 59 y.o. 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  She has been quite busy since we last saw her.  Her son got married in September.  She showed me pictures.  She really had a nice time.  It was a long day.  She is working.  She says work has been quite busy for her.  She had her defibrillator replaced.  The battery was going dead.  She did well with this.  She is on Coumadin.  She is having no problems with the Coumadin.  Her inhibin B level one we last saw her was less than seven.  She has had no problems with bowels or bladder.  She has had no cough or shortness of breath.  She has had her vaccinations.  She has had no problems with rashes.  There is been no leg swelling.  Overall, her performance status is ECOG 0.   Medications:  Allergies as of 06/12/2020      Reactions   Prochlorperazine Edisylate Other (See Comments)   Nervous/ flutter/ shakes   Adhesive [tape] Hives   Redness/hives from adhesive tape( tolerates latex gloves); tolerates paper tape      Medication List       Accurate as of June 12, 2020  2:02 PM. If you have any questions, ask your nurse or doctor.        albuterol 108 (90 Base) MCG/ACT inhaler Commonly known as: VENTOLIN HFA TAKE 2 PUFFS BY MOUTH EVERY 6 HOURS AS NEEDED FOR WHEEZE OR SHORTNESS OF BREATH What changed: See the new instructions.   amoxicillin 500 MG tablet Commonly known as: AMOXIL Take 2,000 mg by mouth See admin instructions. Take 2,000 mg one hour prior to dental visits.   aspirin EC 81 MG tablet Take 81 mg by mouth daily.   b complex vitamins capsule Take 1 capsule by mouth 2 (two) times daily.   carvedilol 3.125 MG tablet Commonly known as:  COREG TAKE 1 TABLET (3.125 MG TOTAL) BY MOUTH 2 (TWO) TIMES DAILY WITH A MEAL.   cholecalciferol 25 MCG (1000 UNIT) tablet Commonly known as: VITAMIN D3 Take 4,000 Units by mouth daily.   furosemide 20 MG tablet Commonly known as: LASIX TAKE 1 TABLET BY MOUTH EVERY DAY   lidocaine-prilocaine cream Commonly known as: EMLA Apply 1 application topically as needed. Apply to port per instructions What changed:   reasons to take this  additional instructions   loperamide 2 MG tablet Commonly known as: IMODIUM A-D Take 4 mg by mouth 3 (three) times daily as needed for diarrhea or loose stools.   LORazepam 0.5 MG tablet Commonly known as: ATIVAN TAKE 1 TABLET (0.5 MG TOTAL) BY MOUTH EVERY 6 (SIX) HOURS AS NEEDED. FOR ANXIETY   methocarbamol 500 MG tablet Commonly known as: ROBAXIN TAKE 1 TABLET (500 MG TOTAL) BY MOUTH EVERY 6 (SIX) HOURS AS NEEDED FOR MUSCLE SPASMS.   Potassium Chloride ER 20 MEQ Tbcr Take 40 mEq by mouth daily. With extra as needed. Please take 4 tablets ( 80 meq total) ONE TIME ONLY on 06/12/19) What changed:   how much to take  when to take this  additional instructions   spironolactone 25 MG tablet Commonly known as: ALDACTONE Take 1 tablet (25  mg total) by mouth daily.   traMADol 50 MG tablet Commonly known as: ULTRAM TAKE 1 TABLET BY MOUTH EVERY 6 HOURS AS NEEDED   traMADol 50 MG tablet Commonly known as: ULTRAM Take 1 tablet (50 mg total) by mouth every 6 (six) hours as needed.   Vitamin D (Ergocalciferol) 1.25 MG (50000 UNIT) Caps capsule Commonly known as: DRISDOL TAKE 1 CAPSULE (50,000 UNITS TOTAL) BY MOUTH EVERY 7 (SEVEN) DAYS. What changed: See the new instructions.   warfarin 7.5 MG tablet Commonly known as: COUMADIN Take as directed by the anticoagulation clinic. If you are unsure how to take this medication, talk to your nurse or doctor. Original instructions: TAKE AS DIRECTED BY ANTICOAGULATION CLINIC.   zolpidem 10 MG  tablet Commonly known as: AMBIEN Take 1 pill at bedtime What changed:   medication strength  See the new instructions. Changed by: Volanda Napoleon, MD       Allergies:  Allergies  Allergen Reactions  . Prochlorperazine Edisylate Other (See Comments)    Nervous/ flutter/ shakes  . Adhesive [Tape] Hives    Redness/hives from adhesive tape( tolerates latex gloves); tolerates paper tape    Past Medical History, Surgical history, Social history, and Family History were reviewed and updated.  Review of Systems:  Review of Systems  Constitutional: Negative.   HENT: Negative.   Eyes: Negative.   Respiratory: Negative.   Cardiovascular: Negative.   Gastrointestinal: Negative.   Genitourinary: Negative.   Musculoskeletal: Negative.   Skin: Negative.   Neurological: Negative.   Endo/Heme/Allergies: Negative.   Psychiatric/Behavioral: Negative.       Exam:  weight is 178 lb (80.7 kg). Her oral temperature is 98.1 F (36.7 C). Her blood pressure is 114/68 and her pulse is 89. Her respiration is 17 and oxygen saturation is 100%.   Wt Readings from Last 3 Encounters:  06/12/20 178 lb (80.7 kg)  04/03/20 179 lb (81.2 kg)  03/02/20 181 lb (82.1 kg)    Physical Exam Vitals reviewed.  HENT:     Head: Normocephalic and atraumatic.  Eyes:     Pupils: Pupils are equal, round, and reactive to light.  Cardiovascular:     Rate and Rhythm: Normal rate and regular rhythm.     Heart sounds: Normal heart sounds.  Pulmonary:     Effort: Pulmonary effort is normal.     Breath sounds: Normal breath sounds.  Abdominal:     General: Bowel sounds are normal.     Palpations: Abdomen is soft.  Musculoskeletal:        General: No tenderness or deformity. Normal range of motion.     Cervical back: Normal range of motion.  Lymphadenopathy:     Cervical: No cervical adenopathy.  Skin:    General: Skin is warm and dry.     Findings: No erythema or rash.  Neurological:     Mental  Status: She is alert and oriented to person, place, and time.  Psychiatric:        Behavior: Behavior normal.        Thought Content: Thought content normal.        Judgment: Judgment normal.      Lab Results  Component Value Date   WBC 9.5 06/12/2020   HGB 10.7 (L) 06/12/2020   HCT 33.8 (L) 06/12/2020   MCV 87.3 06/12/2020   PLT 188 06/12/2020   Lab Results  Component Value Date   FERRITIN 181 07/24/2018   IRON 69 07/24/2018  TIBC 286 07/24/2018   UIBC 217 07/24/2018   IRONPCTSAT 24 07/24/2018   Lab Results  Component Value Date   RETICCTPCT 3.1 07/24/2018   RBC 3.87 06/12/2020   No results found for: KPAFRELGTCHN, LAMBDASER, KAPLAMBRATIO No results found for: IGGSERUM, IGA, IGMSERUM No results found for: Odetta Pink, SPEI   Chemistry      Component Value Date/Time   NA 138 06/12/2020 1230   NA 138 03/30/2020 1159   NA 140 03/15/2016 0953   K 3.2 (L) 06/12/2020 1230   K 3.9 03/15/2016 0953   CL 101 06/12/2020 1230   CL 102 12/13/2015 0803   CO2 29 06/12/2020 1230   CO2 19 (L) 03/15/2016 0953   BUN 14 06/12/2020 1230   BUN 11 03/30/2020 1159   BUN 15.9 03/15/2016 0953   CREATININE 1.13 (H) 06/12/2020 1230   CREATININE 1.4 (H) 03/15/2016 0953      Component Value Date/Time   CALCIUM 9.2 06/12/2020 1230   CALCIUM 9.3 03/15/2016 0953   ALKPHOS 50 06/12/2020 1230   ALKPHOS 55 03/15/2016 0953   AST 12 (L) 06/12/2020 1230   AST 17 03/15/2016 0953   ALT 8 06/12/2020 1230   ALT 23 03/15/2016 0953   BILITOT 0.3 06/12/2020 1230   BILITOT 0.38 03/15/2016 0953       Impression and Plan: Ms. Montone is a very pleasant 59 yo caucasian female with recurrent granulosa cell tumor.  She would like to have her Port-A-Cath out.  Hopefully, we will be able to get this out.  She is little more anemic.  I am unsure as to why she is.  We will have to monitor this.  I will plan to see her back in another 4 months.  I  think this would be reasonable.    Volanda Napoleon, MD 12/20/20212:02 PM

## 2020-06-13 ENCOUNTER — Ambulatory Visit (INDEPENDENT_AMBULATORY_CARE_PROVIDER_SITE_OTHER): Payer: 59

## 2020-06-13 DIAGNOSIS — I480 Paroxysmal atrial fibrillation: Secondary | ICD-10-CM | POA: Diagnosis not present

## 2020-06-13 DIAGNOSIS — Z953 Presence of xenogenic heart valve: Secondary | ICD-10-CM | POA: Diagnosis not present

## 2020-06-13 DIAGNOSIS — Z952 Presence of prosthetic heart valve: Secondary | ICD-10-CM | POA: Diagnosis not present

## 2020-06-13 LAB — INHIBIN B: Inhibin B: 20.8 pg/mL — ABNORMAL HIGH (ref 0.0–16.9)

## 2020-06-13 LAB — POCT INR: INR: 2.1 (ref 2.0–3.0)

## 2020-06-13 NOTE — Patient Instructions (Signed)
Description   Continue taking 1 tablet (7.5mg ) daily except 1/2 tablet (3.75mg ) on Mondays and Fridays. Recheck INR in 4 weeks. Continue normal dark green consistency. [facility] [phone] Main [phone]

## 2020-06-19 ENCOUNTER — Other Ambulatory Visit: Payer: Self-pay | Admitting: Hematology & Oncology

## 2020-06-19 DIAGNOSIS — D3910 Neoplasm of uncertain behavior of unspecified ovary: Secondary | ICD-10-CM

## 2020-06-28 ENCOUNTER — Other Ambulatory Visit: Payer: Self-pay | Admitting: Hematology & Oncology

## 2020-06-28 DIAGNOSIS — D3910 Neoplasm of uncertain behavior of unspecified ovary: Secondary | ICD-10-CM

## 2020-06-28 DIAGNOSIS — F419 Anxiety disorder, unspecified: Secondary | ICD-10-CM

## 2020-06-29 ENCOUNTER — Ambulatory Visit (HOSPITAL_BASED_OUTPATIENT_CLINIC_OR_DEPARTMENT_OTHER)
Admission: RE | Admit: 2020-06-29 | Discharge: 2020-06-29 | Disposition: A | Discharge status: Home / Self Care | Payer: 59 | Source: Ambulatory Visit | Attending: Hematology & Oncology | Admitting: Hematology & Oncology

## 2020-06-29 ENCOUNTER — Other Ambulatory Visit: Payer: Self-pay

## 2020-06-29 ENCOUNTER — Encounter (HOSPITAL_BASED_OUTPATIENT_CLINIC_OR_DEPARTMENT_OTHER): Payer: Self-pay

## 2020-06-29 ENCOUNTER — Inpatient Hospital Stay: Payer: 59 | Attending: Hematology & Oncology

## 2020-06-29 DIAGNOSIS — Z452 Encounter for adjustment and management of vascular access device: Secondary | ICD-10-CM | POA: Insufficient documentation

## 2020-06-29 DIAGNOSIS — C569 Malignant neoplasm of unspecified ovary: Secondary | ICD-10-CM | POA: Insufficient documentation

## 2020-06-29 DIAGNOSIS — D3910 Neoplasm of uncertain behavior of unspecified ovary: Secondary | ICD-10-CM | POA: Diagnosis not present

## 2020-06-29 DIAGNOSIS — Z95828 Presence of other vascular implants and grafts: Secondary | ICD-10-CM

## 2020-06-29 MED ORDER — IOHEXOL 300 MG/ML  SOLN
100.0000 mL | Freq: Once | INTRAMUSCULAR | Status: AC | PRN
  Administered 2020-06-29: 100 mL via INTRAVENOUS

## 2020-06-29 MED ORDER — SODIUM CHLORIDE 0.9% FLUSH
10.0000 mL | Freq: Once | INTRAVENOUS | Status: AC
  Administered 2020-06-29: 10 mL via INTRAVENOUS
  Filled 2020-06-29: qty 10

## 2020-06-29 MED ORDER — HEPARIN SOD (PORK) LOCK FLUSH 100 UNIT/ML IV SOLN
500.0000 [IU] | Freq: Once | INTRAVENOUS | Status: AC
  Administered 2020-06-29: 500 [IU] via INTRAVENOUS
  Filled 2020-06-29: qty 5

## 2020-06-29 NOTE — Patient Instructions (Signed)
Tunneled Central Venous Catheter Flushing Guide  It is important to flush your tunneled central venous catheter each time you use it, both before and after you use it. Flushing your catheter will help prevent it from clogging. What are the risks? Risks may include:  Infection.  Air getting into the catheter and bloodstream. Supplies needed:  A clean pair of gloves.  A disinfecting wipe. Use an alcohol wipe, chlorhexidine wipe, or iodine wipe as told by your health care provider.  A 10 mL syringe that has been prefilled with saline solution.  An empty 10 mL syringe, if a substance called heparin was injected into your catheter. How to flush your catheter When you flush your catheter, make sure you follow any specific instructions from your health care provider or the manufacturer. These are general guidelines. Flushing your catheter before use If there is heparin in your catheter: 1. Wash your hands with soap and water. 2. Put on gloves. 3. Scrub the injection cap for a minimum of 15 seconds with a disinfecting wipe. 4. Unclamp the catheter. 5. Attach the empty syringe to the injection cap. 6. Pull the syringe plunger back and withdraw 10 mL of blood. 7. Place the syringe into an appropriate waste container. 8. Scrub the injection cap for 15 seconds with a disinfecting wipe. 9. Attach the prefilled syringe to the injection cap. 10. Flush the catheter by pushing the plunger forward until all the liquid from the syringe is in the catheter. 11. Remove the syringe from the injection cap. 12. Clamp the catheter. If there is no heparin in your catheter: 1. Wash your hands with soap and water. 2. Put on gloves. 3. Scrub the injection cap for 15 seconds with a disinfecting wipe. 4. Unclamp the catheter. 5. Attach the prefilled syringe to the injection cap. 6. Flush the catheter by pushing the plunger forward until 5 mL of the liquid from the syringe is in the catheter. 7. Pull back on  the syringe until you see blood in the catheter. 8. If you have been asked to collect any blood, follow your health care provider's instructions. Otherwise, flush the catheter with the rest of the solution from the syringe. 9. Remove the syringe from the injection cap. 10. Clamp the catheter.  Flushing your catheter after use 1. Wash your hands with soap and water. 2. Put on gloves. 3. Scrub the injection cap for 15 seconds with a disinfecting wipe. 4. Unclamp the catheter. 5. Attach the prefilled syringe to the injection cap. 6. Flush the catheter by pushing the plunger forward until all of the liquid from the syringe is in the catheter. 7. Remove the syringe from the injection cap. 8. Clamp the catheter. Problems and solutions  If blood cannot be completely cleared from the injection cap, you may need to have the injection cap replaced.  If the catheter is difficult to flush, use the pulsing method. The pulsing method involves pushing only a few milliliters of solution into the catheter at a time and pausing between pushes.  If you do not see blood in the catheter when you pull back on the syringe, change your body position, such as by raising your arms above your head. Take a deep breath and cough. Then, pull back on the syringe. If you still do not see blood, flush the catheter with a small amount of solution. Then, change positions again and take a breath or cough. Pull back on the syringe again. If you still do not see   blood, finish flushing the catheter and contact your health care provider. Do not use your catheter until your health care provider says it is okay. General tips  Have someone help you flush your catheter, if possible.  Do not force fluid through your catheter.  Do not use a syringe that is larger or smaller than 10 mL. Using a smaller syringe can make the catheter burst.  Do not use your catheter without flushing it first if it has heparin in it. Contact a health  care provider if:  You cannot see any blood in the catheter when you flush it before using it.  Your catheter is difficult to flush. Get help right away if:  You cannot flush the catheter.  The catheter leaks when you flush it or when there is fluid in it.  There are cracks or breaks in the catheter. Summary  It is important to flush your tunneled central venous catheter each time you use it, both before and after you use it.  Scrub the injection cap for 15 seconds with a disinfecting wipe before and after you flush it.  When you flush your catheter, make sure you follow any specific instructions from your health care provider or the manufacturer.  Get help right away if you cannot flush the catheter. This information is not intended to replace advice given to you by your health care provider. Make sure you discuss any questions you have with your health care provider. Document Revised: 03/05/2019 Document Reviewed: 08/26/2018 [employer] Patient Education  2020 [employer]  

## 2020-07-04 ENCOUNTER — Ambulatory Visit (INDEPENDENT_AMBULATORY_CARE_PROVIDER_SITE_OTHER): Payer: 59

## 2020-07-04 DIAGNOSIS — I472 Ventricular tachycardia: Secondary | ICD-10-CM

## 2020-07-04 DIAGNOSIS — I4729 Other ventricular tachycardia: Secondary | ICD-10-CM

## 2020-07-04 LAB — CUP PACEART REMOTE DEVICE CHECK
Battery Remaining Longevity: 92 mo
Battery Remaining Percentage: 95 %
Battery Voltage: 3.05 V
Brady Statistic AP VP Percent: 1 %
Brady Statistic AP VS Percent: 1 %
Brady Statistic AS VP Percent: 1 %
Brady Statistic AS VS Percent: 99 %
Brady Statistic RA Percent Paced: 1 %
Brady Statistic RV Percent Paced: 1 %
Date Time Interrogation Session: 20220111020639
HighPow Impedance: 56 Ohm
Implantable Lead Implant Date: 20110503
Implantable Lead Implant Date: 20130815
Implantable Lead Location: 753859
Implantable Lead Location: 753860
Implantable Pulse Generator Implant Date: 20211012
Lead Channel Impedance Value: 260 Ohm
Lead Channel Impedance Value: 380 Ohm
Lead Channel Pacing Threshold Amplitude: 0.5 V
Lead Channel Pacing Threshold Amplitude: 2 V
Lead Channel Pacing Threshold Pulse Width: 0.5 ms
Lead Channel Pacing Threshold Pulse Width: 1 ms
Lead Channel Sensing Intrinsic Amplitude: 1.6 mV
Lead Channel Sensing Intrinsic Amplitude: 11.9 mV
Lead Channel Setting Pacing Amplitude: 2 V
Lead Channel Setting Pacing Amplitude: 2.25 V
Lead Channel Setting Pacing Pulse Width: 1 ms
Lead Channel Setting Sensing Sensitivity: 0.5 mV
Pulse Gen Serial Number: 111028151

## 2020-07-07 ENCOUNTER — Other Ambulatory Visit: Payer: Self-pay | Admitting: Hematology & Oncology

## 2020-07-07 DIAGNOSIS — D3910 Neoplasm of uncertain behavior of unspecified ovary: Secondary | ICD-10-CM

## 2020-07-11 ENCOUNTER — Encounter: Payer: 59 | Admitting: Internal Medicine

## 2020-07-13 ENCOUNTER — Other Ambulatory Visit: Payer: Self-pay | Admitting: Hematology & Oncology

## 2020-07-13 DIAGNOSIS — D3910 Neoplasm of uncertain behavior of unspecified ovary: Secondary | ICD-10-CM

## 2020-07-19 ENCOUNTER — Other Ambulatory Visit: Payer: Self-pay

## 2020-07-19 ENCOUNTER — Ambulatory Visit (INDEPENDENT_AMBULATORY_CARE_PROVIDER_SITE_OTHER): Payer: 59 | Admitting: Pharmacist

## 2020-07-19 DIAGNOSIS — Z953 Presence of xenogenic heart valve: Secondary | ICD-10-CM | POA: Diagnosis not present

## 2020-07-19 DIAGNOSIS — Z952 Presence of prosthetic heart valve: Secondary | ICD-10-CM | POA: Diagnosis not present

## 2020-07-19 DIAGNOSIS — I480 Paroxysmal atrial fibrillation: Secondary | ICD-10-CM

## 2020-07-19 LAB — POCT INR: INR: 2.7 (ref 2.0–3.0)

## 2020-07-19 NOTE — Patient Instructions (Signed)
Continue taking 1 tablet (7.5mg ) daily except 1/2 tablet (3.75mg ) on Mondays and Fridays. Recheck INR in 5 weeks. Continue normal dark green consistency. [facility] [phone] Main [phone]

## 2020-07-20 NOTE — Progress Notes (Signed)
Remote ICD transmission.   

## 2020-07-24 ENCOUNTER — Other Ambulatory Visit: Payer: Self-pay | Admitting: Hematology & Oncology

## 2020-07-24 DIAGNOSIS — F419 Anxiety disorder, unspecified: Secondary | ICD-10-CM

## 2020-07-24 NOTE — Progress Notes (Signed)
Cardiology Office Note:    Date:  07/25/2020   ID:  [name], DOB [birth year], MRN [id number]  PCP:  Volanda Napoleon, MD  Cardiologist:  Sinclair Grooms, MD   Referring MD: Volanda Napoleon, MD   Chief Complaint  Patient presents with  . Atrial Fibrillation  . Congestive Heart Failure  . Cardiac Valve Problem    History of Present Illness:    [name] is a 60 y.o. female with a hx of HLD,hypothyroidism, stage IIICKD, ongoing tobacco abuse, recurrent ovarian cancer with multiple intra-abdominal metastases treated with prior surgical resection in 2014 and 2015 with further slow progression,ICM with VT s/p ICD, CAD s/pinferior MI in 5284 complicated by postinfarction papillary muscle rupture and left ventricular aneurysm who underwent urgent repair with mitral valve replacement using a bioprosthetic tissue valve and repair of the left ventricular aneurysm and  prosthetic mitral valve stenosis s/p TMVR (08/2017) who presents to clinic for follow up.  She is doing well, no angina, occasional palpitations, no orthopnea.  Has random left ankle edema.  Had vein graft harvesting from that leg.  Denies episodes of syncope.  No AICD discharge.  Appetite has been stable.  She works every day 5 days/week at [employer] and [name].  She is vaccinated, boosted, and has not had Covid.  Past Medical History:  Diagnosis Date  . Arthritis    "everywhere"  . Asthma    as a child  . Automatic implantable cardioverter-defibrillator in situ    DR. Beckie Salts   . Bladder cancer [facility]) 2009   "injected medicine to get rid of it"  . Chronic systolic CHF (congestive heart failure) ([place])   . CKD (chronic kidney disease), stage III ([place])   . Coronary artery disease    a. large RV/inferior MI complicated by pseudoaneurysm s/p aneurysemectomy and CABG 10/2009 with papillary muscle rupture requiring bioprosthetic mitral valve replacement in [facility].  . 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 H/O thymectomy   . High cholesterol   . Hypothyroidism   . ICD (implantable cardiac defibrillator) in place   . Ischemic cardiomyopathy    a.  ICM and VT arrest 10/2009 s/p AICD (revision 01/2012 due to RV lead problem).  . Myocardial infarction ([place]) 10/14/2009  . Neuropathy    FEET AND HANDS - FROM CHEMO - BUT MUCH IMPROVED  . PAF (paroxysmal atrial fibrillation) ([name])   . Pain    JOINT PAINS AND MUSCLE ACHES ALL OVER.  Marland Kitchen Port-A-Cath in place    RIGHT UPPER CHEST  . Prosthetic valve dysfunction   . S/P mitral valve replacement with bioprosthetic valve 10/21/2009   Edwards Perimount stented bovine pericardial tissue valve, size 29 mm  . S/P valve-in-valve transcatheter mitral valve replacement 09/16/2017   29 mm Edwards Sapien 3 transcatheter heart valve placed via transapical approach  . SVT (supraventricular tachycardia) ([place])   . Tobacco abuse   . Ventricular tachycardia [facility])     Past Surgical History:  Procedure Laterality Date  . ABDOMINAL HYSTERECTOMY N/A 07/27/2013   Procedure: TUMOR EXCISION OF ABDOMINAL MASS;  Surgeon: Alvino Chapel, MD;  Location: [facility];  Service: Gynecology;  Laterality: N/A;  . APPENDECTOMY  1989  . BILATERAL OOPHORECTOMY  2007  . CARDIAC DEFIBRILLATOR PLACEMENT  02/06/2012   "lead change"  . CARDIAC VALVE REPLACEMENT    . CYSTOSCOPY Right 02/23/2013   Procedure:  CYSTOSCOPY WITH STENT REMOVAL;  Surgeon: Alvino Chapel, MD;  Location: [facility];  Service: Gynecology;  Laterality: Right;  . ICD GENERATOR CHANGEOUT N/A 04/03/2020   Procedure: [place];  Surgeon: Evans Lance, MD;  Location: [place] CV LAB;  Service: Cardiovascular;  Laterality: N/A;  . IMPLANTABLE CARDIOVERTER DEFIBRILLATOR REVISION N/A 02/06/2012   Procedure: IMPLANTABLE CARDIOVERTER DEFIBRILLATOR REVISION;  Surgeon: Evans Lance, MD;  Location: [facility] CATH LAB;  Service:  Cardiovascular;  Laterality: N/A;  . INSERT / REPLACE / REMOVE PACEMAKER  10/2009   DUAL-CHAMBER St. JUDE DEFIBRILLATOR  . IR IMAGING GUIDED PORT INSERTION  05/11/2018  . LAPAROTOMY N/A 01/19/2013   Procedure: TUMOR DEBULKING / BOWEL RESECTION/INSERTION RIGHT UTERERAL DOUBLE J STENT/OMENTECTOMY;  Surgeon: Alvino Chapel, MD;  Location: [facility];  Service: Gynecology;  Laterality: N/A;  . MITRAL VALVE REPLACEMENT  10/21/2009   56mm Edwards Perimount bovine pericardial tissue valve - [facility]  . RIGHT/LEFT HEART CATH AND CORONARY ANGIOGRAPHY N/A 07/02/2017   Procedure: RIGHT/LEFT HEART CATH AND CORONARY ANGIOGRAPHY;  Surgeon: Belva Crome, MD;  Location: [place] CV LAB;  Service: Cardiovascular;  Laterality: N/A;  . TEE WITHOUT CARDIOVERSION N/A 09/16/2017   Procedure: TRANSESOPHAGEAL ECHOCARDIOGRAM (TEE);  Surgeon: [name], MD;  Location: [place];  Service: Open Heart Surgery;  Laterality: N/A;  . THYMECTOMY  2009  . TONSILLECTOMY AND ADENOIDECTOMY  ~ 1967  . TOTAL HIP ARTHROPLASTY  05/06/2012   Procedure: TOTAL HIP ARTHROPLASTY;  Surgeon: Gearlean Alf, MD;  Location[facility];  Service: Orthopedics;  Laterality: Right;  . TRANSCATHETER MITRAL VALVE REPLACEMENT, TRANSAPICAL N/A 09/16/2017   Procedure: TRANSCATHETER MITRAL VALVE REPLACEMENT,TRANSAPICAL;  Surgeon: [name], MD;  Location: [place];  Service: Open Heart Surgery;  Laterality: N/A;  . TRANSURETHRAL RESECTION OF BLADDER  2009   "for bladder cancer"  . TUBAL LIGATION  1993  . VAGINAL HYSTERECTOMY  2001    Current Medications: Current Meds  Medication Sig  . albuterol (VENTOLIN HFA) 108 (90 Base) MCG/ACT inhaler TAKE 2 PUFFS BY MOUTH EVERY 6 HOURS AS NEEDED FOR WHEEZE OR SHORTNESS OF BREATH  . amoxicillin (AMOXIL) 500 MG tablet Take 2,000 mg by mouth See admin instructions. Take 2,000 mg one hour prior to dental visits.  Marland Kitchen aspirin EC 81 MG tablet Take 81 mg by mouth daily.  Marland Kitchen b complex vitamins capsule  Take 1 capsule by mouth 2 (two) times daily.  . carvedilol (COREG) 3.125 MG tablet TAKE 1 TABLET (3.125 MG TOTAL) BY MOUTH 2 (TWO) TIMES DAILY WITH A MEAL.  . cholecalciferol (VITAMIN D3) 25 MCG (1000 UNIT) tablet Take 4,000 Units by mouth daily.  . furosemide (LASIX) 20 MG tablet TAKE 1 TABLET BY MOUTH EVERY DAY  . lidocaine-prilocaine (EMLA) cream Apply 1 application topically as needed. Apply to port per instructions  . loperamide (IMODIUM A-D) 2 MG tablet Take 4 mg by mouth 3 (three) times daily as needed for diarrhea or loose stools.  Marland Kitchen LORazepam (ATIVAN) 0.5 MG tablet TAKE 1 TABLET (0.5 MG TOTAL) BY MOUTH EVERY 6 (SIX) HOURS AS NEEDED. FOR ANXIETY  . methocarbamol (ROBAXIN) 500 MG tablet TAKE 1 TABLET (500 MG TOTAL) BY MOUTH EVERY 6 (SIX) HOURS AS NEEDED FOR MUSCLE SPASMS.  Marland Kitchen Potassium Chloride ER 20 MEQ TBCR Take 20 mEq by mouth once a week.  . spironolactone (ALDACTONE) 25 MG tablet Take 1 tablet (25 mg total) by mouth daily.  . traMADol (ULTRAM) 50 MG tablet TAKE 1 TABLET  BY MOUTH EVERY 6 HOURS AS NEEDED  . Vitamin D, Ergocalciferol, (DRISDOL) 1.25 MG (50000 UNIT) CAPS capsule TAKE 1 CAPSULE (50,000 UNITS TOTAL) BY MOUTH EVERY 7 (SEVEN) DAYS.  Marland Kitchen warfarin (COUMADIN) 7.5 MG tablet TAKE AS DIRECTED BY ANTICOAGULATION CLINIC.  Marland Kitchen zolpidem (AMBIEN) 10 MG tablet TAKE 1 TABLET BY MOUTH AT BEDTIME     Allergies:   Augmentin [amoxicillin-pot clavulanate], Prochlorperazine edisylate, and Adhesive [tape]   Social History   Socioeconomic History  . Marital status: Divorced    Spouse name: Not on file  . Number of children: Not on file  . Years of education: Not on file  . Highest education level: Not on file  Occupational History    Comment: WORKS FULL TIME  Tobacco Use  . Smoking status: Current Every Day Smoker    Packs/day: 0.50    Years: 30.00    Pack years: 15.00    Types: Cigarettes    Start date: 03/28/1974  . Smokeless tobacco: Never Used  Vaping Use  . Vaping Use: Never used   Substance and Sexual Activity  . Alcohol use: No    Alcohol/week: 0.0 standard drinks  . Drug use: No  . Sexual activity: Not on file  Other Topics Concern  . Not on file  Social History Narrative   WORKS FULL TIME   SINGLE   TOBACCO USE-YES   IMPLANTATION OF DUAL-CHAMBER [place]   Social Determinants of Health   Financial Resource Strain: Not on file  Food Insecurity: Not on file  Transportation Needs: Not on file  Physical Activity: Not on file  Stress: Not on file  Social Connections: Not on file     Family History: The patient's family history includes Heart attack in her brother, maternal grandfather, and paternal grandfather; Stroke in her maternal grandmother.  ROS:   Please see the history of present illness.    Recurrence of her gynecologic tumor.  This was followed by Dr. Marin Olp.  Markers are slowly going up.  No active therapy at this time.  No symptoms.  All other systems reviewed and are negative.  EKGs/Labs/Other Studies Reviewed:    The following studies were reviewed today: 2D Doppler echocardiogram March 2020: IMPRESSIONS  1. The left ventricle has mild-moderately reduced systolic function, with  an ejection fraction of 40-45%. The cavity size was mildly dilated. Left  ventricular diastolic Doppler parameters are indeterminate.  2. Septal hypokinesis inferior basal aneurysm.  3. The right ventricle has normal systolic function. The cavity was  normal. There is no increase in right ventricular wall thickness.  4. Pacing wires in RA/RV.  5. Left atrial size was moderately dilated.  6. Post bioprosthetic MVR and subsequent valve in vlave replacement No  significant MR and stabel mean gradient 6 mmHg.  7. The tricuspid valve is normal in structure.  8. The aortic valve is tricuspid Moderate thickening of the aortic valve  Moderate calcification of the aortic valve.  9. The pulmonic valve was grossly normal. Pulmonic valve  regurgitation is  mild by color flow Doppler.  10. The aortic root is normal in size and structure.   EKG:  EKG not performed  Recent Labs: 06/12/2020: ALT 8; BUN 14; Creatinine 1.13; Hemoglobin 10.7; Platelet Count 188; Potassium 3.2; Sodium 138  Recent Lipid Panel    Component Value Date/Time   CHOL 247 (H) 09/18/2017 0240   TRIG 144 09/18/2017 0240   HDL 37 (L) 09/18/2017 0240   CHOLHDL 6.7 09/18/2017 0240  VLDL 29 09/18/2017 0240   LDLCALC 181 (H) 09/18/2017 0240    Physical Exam:    VS:  BP (!) 94/58   Pulse 84   Ht 5\' 6"  (1.676 m)   Wt 181 lb (82.1 kg)   SpO2 99%   BMI 29.21 kg/m     Wt Readings from Last 3 Encounters:  07/25/20 181 lb (82.1 kg)  06/12/20 178 lb (80.7 kg)  04/03/20 179 lb (81.2 kg)     GEN: Moderate overweight. No acute distress HEENT: Normal NECK: No JVD. LYMPHATICS: No lymphadenopathy CARDIAC: No murmur. RRR no gallop, or right leg edema.  There is left ankle edema. VASCULAR:  Normal Pulses. No bruits. RESPIRATORY:  Clear to auscultation without rales, wheezing or rhonchi  ABDOMEN: Soft, non-tender, non-distended, No pulsatile mass, MUSCULOSKELETAL: No deformity  SKIN: Warm and dry NEUROLOGIC:  Alert and oriented x 3 PSYCHIATRIC:  Normal affect   ASSESSMENT:    1. S/P valve-in-valve transcatheter mitral valve replacement   2. Coronary artery disease involving coronary bypass graft of native heart without angina pectoris   3. Chronic systolic HF (heart failure) (HCC)   4. Paroxysmal atrial fibrillation ([place])   5. Automatic implantable cardioverter-defibrillator in situ   6. Ventricular fibrillation ([place])   7. Simple chronic bronchitis ([place])   8. Educated about COVID-19 virus infection    PLAN:    In order of problems listed above:  1. 2D Doppler echocardiogram to assess LV function and valve in valve TMVR 2. Secondary prevention discussed 3. 2D Doppler echocardiogram.  Continue carvedilol, Lasix, and Aldactone.  If LV function  is decreasing, consider adding SGLT2. 4. No recent issues with atrial fibrillation. 5. Followed by Dr. Lovena Le 6. .No recurrence 7. Not discussed 8. Genetic, boosted, and practicing social distancing.  Doing echo is stable I will see her back in 1 year.  If LVEF is decreasing, will consider adding SGLT2 therapy.   Medication Adjustments/Labs and Tests Ordered: Current medicines are reviewed at length with the patient today.  Concerns regarding medicines are outlined above.  No orders of the defined types were placed in this encounter.  No orders of the defined types were placed in this encounter.   There are no Patient Instructions on file for this visit.   Signed, Sinclair Grooms, MD  07/25/2020 4:37 PM    [facility]

## 2020-07-25 ENCOUNTER — Encounter: Payer: Self-pay | Admitting: Interventional Cardiology

## 2020-07-25 ENCOUNTER — Ambulatory Visit (INDEPENDENT_AMBULATORY_CARE_PROVIDER_SITE_OTHER): Payer: 59 | Admitting: Interventional Cardiology

## 2020-07-25 ENCOUNTER — Other Ambulatory Visit: Payer: Self-pay

## 2020-07-25 VITALS — BP 94/58 | HR 84 | Ht 66.0 in | Wt 181.0 lb

## 2020-07-25 DIAGNOSIS — J410 Simple chronic bronchitis: Secondary | ICD-10-CM

## 2020-07-25 DIAGNOSIS — Z952 Presence of prosthetic heart valve: Secondary | ICD-10-CM | POA: Diagnosis not present

## 2020-07-25 DIAGNOSIS — Z95810 Presence of automatic (implantable) cardiac defibrillator: Secondary | ICD-10-CM

## 2020-07-25 DIAGNOSIS — I5022 Chronic systolic (congestive) heart failure: Secondary | ICD-10-CM | POA: Diagnosis not present

## 2020-07-25 DIAGNOSIS — I25810 Atherosclerosis of coronary artery bypass graft(s) without angina pectoris: Secondary | ICD-10-CM

## 2020-07-25 DIAGNOSIS — I4901 Ventricular fibrillation: Secondary | ICD-10-CM

## 2020-07-25 DIAGNOSIS — I480 Paroxysmal atrial fibrillation: Secondary | ICD-10-CM

## 2020-07-25 DIAGNOSIS — Z7189 Other specified counseling: Secondary | ICD-10-CM

## 2020-07-25 DIAGNOSIS — I340 Nonrheumatic mitral (valve) insufficiency: Secondary | ICD-10-CM

## 2020-07-25 NOTE — Patient Instructions (Signed)
Medication Instructions:  Your physician recommends that you continue on your current medications as directed. Please refer to the Current Medication list given to you today.  *If you need a refill on your cardiac medications before your next appointment, please call your pharmacy*   Lab Work: None If you have labs (blood work) drawn today and your tests are completely normal, you will receive your results only by: . MyChart Message (if you have MyChart) OR . A paper copy in the mail If you have any lab test that is abnormal or we need to change your treatment, we will call you to review the results.   Testing/Procedures: Your physician has requested that you have an echocardiogram. Echocardiography is a painless test that uses sound waves to create images of your heart. It provides your doctor with information about the size and shape of your heart and how well your heart's chambers and valves are working. This procedure takes approximately one hour. There are no restrictions for this procedure.   Follow-Up: At [facility], you and your health needs are our priority.  As part of our continuing mission to provide you with exceptional heart care, we have created designated Provider Care Teams.  These Care Teams include your primary Cardiologist (physician) and Advanced Practice Providers (APPs -  Physician Assistants and Nurse Practitioners) who all work together to provide you with the care you need, when you need it.  We recommend signing up for the patient portal called "MyChart".  Sign up information is provided on this After Visit Summary.  MyChart is used to connect with patients for Virtual Visits (Telemedicine).  Patients are able to view lab/test results, encounter notes, upcoming appointments, etc.  Non-urgent messages can be sent to your provider as well.   To learn more about what you can do with MyChart, go to [email or web address].    Your next appointment:   1  year(s)  The format for your next appointment:   In Person  Provider:   You may see Henry W Smith III, MD or one of the following Advanced Practice Providers on your designated Care Team:    Jill McDaniel, NP    Other Instructions   

## 2020-07-27 ENCOUNTER — Encounter: Payer: 59 | Admitting: Internal Medicine

## 2020-07-27 ENCOUNTER — Other Ambulatory Visit: Payer: Self-pay | Admitting: Hematology & Oncology

## 2020-08-04 ENCOUNTER — Other Ambulatory Visit: Payer: Self-pay | Admitting: Student

## 2020-08-04 DIAGNOSIS — E876 Hypokalemia: Secondary | ICD-10-CM

## 2020-08-11 ENCOUNTER — Other Ambulatory Visit: Payer: Self-pay | Admitting: Hematology & Oncology

## 2020-08-11 ENCOUNTER — Telehealth: Payer: Self-pay

## 2020-08-11 DIAGNOSIS — F419 Anxiety disorder, unspecified: Secondary | ICD-10-CM

## 2020-08-11 DIAGNOSIS — D3910 Neoplasm of uncertain behavior of unspecified ovary: Secondary | ICD-10-CM

## 2020-08-11 NOTE — Telephone Encounter (Signed)
Pt called stating she has been started on Doxycycline 100mg  BID x 7 days on 08/09/20 and Prednisone 20mg  QD x 10 days on 08/10/20.  Made sooner OV for INR check since both medications can interact with Warfarin and cause INR to be elevated.  Pt coming to see Tommye Standard, PA on 08/14/20 Monday at 2:15p, with ECHO at 2:50p.  Made Coumadin appt at 2:00p that same day.

## 2020-08-13 NOTE — Progress Notes (Signed)
Cardiology Office Note Date:  08/13/2020  Patient ID:  [name], [name] 1961/06/21, MRN [id number] PCP:  Volanda Napoleon, MD  Cardiologist:  Dr. Tamala Julian Structural: Dr. Burt Knack Electrophysiologist: Dr. Lovena Le    Chief Complaint:  post gen change visit  History of Present Illness: [name] is a 60 y.o. female with history of recurrent ovarian Ca (multiple intra-abdominal metastases treated with prior surgical resection in 2014 and 2015 with further slow progression), hypothyroidism, CKD (III), CAD, s/pinferior MI in 4580 complicated by postinfarction papillary muscle rupture and left ventricular aneurysm who underwent urgent repair with mitral valve replacement using a bioprosthetic tissue valve and repair of the left ventricular aneurysm, CABG), prosthetic mitral valve stenosis s/p TMVR (08/2017), ICM, VT, ICD, chronic CHF (systolic), AFib   She comes in today to be seen for dr. Lovena Le, last seen by him at the time of her gen change.  He discussed known A lead malfunction with low pacing impedence but p waves and the atrial threshold are still good, since she does not pace much, he recommended not removing or addind a new atrial lead.  More recently saw Dr Tamala Julian, 07/25/20, planned for echo, no med changes were made.  08/08/20 minute clinic w/sinusitis  TODAY She is doing well.  Sinus are much batter No changes or new concerns since her visit with Dr. Tamala Julian Healed well at her device  Dec K+ was low and she has since resumed her daily K+ replacement, she reports at the advice of A. Albertina Sen[place], [place] Looks forward to getting her port out, this happens soon   Device information SJM dual chamber ICD, implanted RA lead 2011, RV  Lead 2013, gen change 04/03/20   Past Medical History:  Diagnosis Date  . Arthritis    "everywhere"  . Asthma    as a child  . Automatic implantable cardioverter-defibrillator in situ    DR. Beckie Salts   . Bladder cancer Surgery By [name]) 2009   "injected medicine  to get rid of it"  . Chronic systolic CHF (congestive heart failure) ([place])   . CKD (chronic kidney disease), stage III ([place])   . Coronary artery disease    a. large RV/inferior MI complicated by pseudoaneurysm s/p aneurysemectomy and CABG 10/2009 with papillary muscle rupture requiring bioprosthetic mitral valve replacement in [facility]  . Granulosa cell carcinoma of ovary ([place])    recurrent - surgical resection 2007, 2014 and 2016   . H/O thymectomy   . High cholesterol   . Hypothyroidism   . ICD (implantable cardiac defibrillator) in place   . Ischemic cardiomyopathy    a.  ICM and VT arrest 10/2009 s/p AICD (revision 01/2012 due to RV lead problem).  . Myocardial infarction ([place]) 10/14/2009  . Neuropathy    FEET AND HANDS - FROM CHEMO - BUT MUCH IMPROVED  . PAF (paroxysmal atrial fibrillation) ([place])   . Pain    JOINT PAINS AND MUSCLE ACHES ALL OVER.  Marland Kitchen Port-A-Cath in place    RIGHT UPPER CHEST  . Prosthetic valve dysfunction   . S/P mitral valve replacement with bioprosthetic valve 10/21/2009   Edwards Perimount stented bovine pericardial tissue valve, size 29 mm  . S/P valve-in-valve transcatheter mitral valve replacement 09/16/2017   29 mm Edwards Sapien 3 transcatheter heart valve placed via transapical approach  . SVT (supraventricular tachycardia) ([place])   . Tobacco abuse   . Ventricular tachycardia [facility])     Past Surgical History:  Procedure Laterality Date  . ABDOMINAL  HYSTERECTOMY N/A 07/27/2013   Procedure: TUMOR EXCISION OF ABDOMINAL MASS;  Surgeon: Stacy Chapel, MD;  Location: [facility];  Service: Gynecology;  Laterality: N/A;  . APPENDECTOMY  1989  . BILATERAL OOPHORECTOMY  2007  . CARDIAC DEFIBRILLATOR PLACEMENT  02/06/2012   "lead change"  . CARDIAC VALVE REPLACEMENT    . CYSTOSCOPY Right 02/23/2013   Procedure: CYSTOSCOPY WITH STENT REMOVAL;  Surgeon: Stacy Chapel, MD;  Location: [facility];  Service: Gynecology;  Laterality: Right;  . ICD GENERATOR  CHANGEOUT N/A 04/03/2020   Procedure: [place];  Surgeon: Evans Lance, MD;  Location: [place] CV LAB;  Service: Cardiovascular;  Laterality: N/A;  . IMPLANTABLE CARDIOVERTER DEFIBRILLATOR REVISION N/A 02/06/2012   Procedure: IMPLANTABLE CARDIOVERTER DEFIBRILLATOR REVISION;  Surgeon: Evans Lance, MD;  Location: [facility] CATH LAB;  Service: Cardiovascular;  Laterality: N/A;  . INSERT / REPLACE / REMOVE PACEMAKER  10/2009   DUAL-CHAMBER St. JUDE DEFIBRILLATOR  . IR IMAGING GUIDED PORT INSERTION  05/11/2018  . LAPAROTOMY N/A 01/19/2013   Procedure: TUMOR DEBULKING / BOWEL RESECTION/INSERTION RIGHT UTERERAL DOUBLE J STENT/OMENTECTOMY;  Surgeon: Stacy Chapel, MD;  Location: [facility];  Service: Gynecology;  Laterality: N/A;  . MITRAL VALVE REPLACEMENT  10/21/2009   78mm Edwards Perimount bovine pericardial tissue valve - [facility]  . RIGHT/LEFT HEART CATH AND CORONARY ANGIOGRAPHY N/A 07/02/2017   Procedure: RIGHT/LEFT HEART CATH AND CORONARY ANGIOGRAPHY;  Surgeon: Stacy Crome, MD;  Location: [place] CV LAB;  Service: Cardiovascular;  Laterality: N/A;  . TEE WITHOUT CARDIOVERSION N/A 09/16/2017   Procedure: TRANSESOPHAGEAL ECHOCARDIOGRAM (TEE);  Surgeon: Stacy Mocha, MD;  Location: [place];  Service: Open Heart Surgery;  Laterality: N/A;  . THYMECTOMY  2009  . TONSILLECTOMY AND ADENOIDECTOMY  ~ 1967  . TOTAL HIP ARTHROPLASTY  05/06/2012   Procedure: TOTAL HIP ARTHROPLASTY;  Surgeon: Stacy Alf, MD;  Location: [facility];  Service: Orthopedics;  Laterality: Right;  . TRANSCATHETER MITRAL VALVE REPLACEMENT, TRANSAPICAL N/A 09/16/2017   Procedure: TRANSCATHETER MITRAL VALVE REPLACEMENT,TRANSAPICAL;  Surgeon: Stacy Mocha, MD;  Location: [place];  Service: Open Heart Surgery;  Laterality: N/A;  . TRANSURETHRAL RESECTION OF BLADDER  2009   "for bladder cancer"  . TUBAL LIGATION  1993  . VAGINAL HYSTERECTOMY  2001    Current Outpatient Medications  Medication Sig  Dispense Refill  . albuterol (VENTOLIN HFA) 108 (90 Base) MCG/ACT inhaler TAKE 2 PUFFS BY MOUTH EVERY 6 HOURS AS NEEDED FOR WHEEZE OR SHORTNESS OF BREATH 6.7 each 1  . amoxicillin (AMOXIL) 500 MG tablet Take 2,000 mg by mouth See admin instructions. Take 2,000 mg one hour prior to dental visits.    Marland Kitchen aspirin EC 81 MG tablet Take 81 mg by mouth daily.    Marland Kitchen b complex vitamins capsule Take 1 capsule by mouth 2 (two) times daily.    . carvedilol (COREG) 3.125 MG tablet TAKE 1 TABLET (3.125 MG TOTAL) BY MOUTH 2 (TWO) TIMES DAILY WITH A MEAL. 180 tablet 2  . cholecalciferol (VITAMIN D3) 25 MCG (1000 UNIT) tablet Take 4,000 Units by mouth daily.    . furosemide (LASIX) 20 MG tablet TAKE 1 TABLET BY MOUTH EVERY DAY 90 tablet 2  . lidocaine-prilocaine (EMLA) cream Apply 1 application topically as needed. Apply to port per instructions 30 g 0  . loperamide (IMODIUM A-D) 2 MG tablet Take 4 mg by mouth 3 (three) times daily as needed for diarrhea or loose stools.    Marland Kitchen  LORazepam (ATIVAN) 0.5 MG tablet TAKE 1 TABLET (0.5 MG TOTAL) BY MOUTH EVERY 6 (SIX) HOURS AS NEEDED. FOR ANXIETY 60 tablet 0  . methocarbamol (ROBAXIN) 500 MG tablet TAKE 1 TABLET BY MOUTH EVERY 6 HOURS AS NEEDED FOR MUSCLE SPASMS. 60 tablet 2  . Potassium Chloride ER 20 MEQ TBCR Take 20 mEq by mouth once a week.    . spironolactone (ALDACTONE) 25 MG tablet Take 1 tablet (25 mg total) by mouth daily. 90 tablet 3  . traMADol (ULTRAM) 50 MG tablet TAKE 1 TABLET BY MOUTH EVERY 6 HOURS AS NEEDED 60 tablet 1  . Vitamin D, Ergocalciferol, (DRISDOL) 1.25 MG (50000 UNIT) CAPS capsule TAKE 1 CAPSULE (50,000 UNITS TOTAL) BY MOUTH EVERY 7 (SEVEN) DAYS. 12 capsule 3  . warfarin (COUMADIN) 7.5 MG tablet TAKE AS DIRECTED BY ANTICOAGULATION CLINIC. 30 tablet 3  . zolpidem (AMBIEN) 10 MG tablet TAKE 1 TABLET BY MOUTH AT BEDTIME 30 tablet 0   No current facility-administered medications for this visit.   Facility-Administered Medications Ordered in Other  Visits  Medication Dose Route Frequency Provider Last Rate Last Admin  . sodium chloride flush (NS) 0.9 % injection 10 mL  10 mL Intravenous PRN Volanda Napoleon, MD   10 mL at 09/14/18 1150    Allergies:   Augmentin [amoxicillin-pot clavulanate], Prochlorperazine edisylate, and Adhesive [tape]   Social History:  The patient  reports that she has been smoking cigarettes. She started smoking about 46 years ago. She has a 15.00 pack-year smoking history. She has never used smokeless tobacco. She reports that she does not drink alcohol and does not use drugs.   Family History:  The patient's family history includes Heart attack in her brother, maternal grandfather, and paternal grandfather; Stroke in her maternal grandmother.  ROS:  Please see the history of present illness.    All other systems are reviewed and otherwise negative.   PHYSICAL EXAM:  VS:  There were no vitals taken for this visit. BMI: There is no height or weight on file to calculate BMI. Well nourished, well developed, in no acute distress HEENT: normocephalic, atraumatic Neck: no JVD, carotid bruits or masses Cardiac:  RRR; no significant murmurs, no rubs, or gallops Lungs:  CTA b/l, no wheezing, rhonchi or rales Abd: soft, nontender MS: no deformity or atrophy Ext: no edema Skin: warm and dry, no rash Neuro:  No gross deficits appreciated Psych: euthymic mood, full affect  ICD site is well healed, no erythema, edema, stable, no tethering or discomfort   EKG:  Not done today  Device interrogation done today and reviewed by myself:  Battery and lead measurements are good One NSVT 07/22/20,  seconds, slow 163bpm   08/27/2018: TTE IMPRESSIONS  1. The left ventricle has mild-moderately reduced systolic function, with  an ejection fraction of 40-45%. The cavity size was mildly dilated. Left  ventricular diastolic Doppler parameters are indeterminate.  2. Septal hypokinesis inferior basal aneurysm.  3. The right  ventricle has normal systolic function. The cavity was  normal. There is no increase in right ventricular wall thickness.  4. Pacing wires in RA/RV.  5. Left atrial size was moderately dilated.  6. Post bioprosthetic MVR and subsequent valve in vlave replacement No  significant MR and stabel mean gradient 6 mmHg.  7. The tricuspid valve is normal in structure.  8. The aortic valve is tricuspid Moderate thickening of the aortic valve  Moderate calcification of the aortic valve.  9. The pulmonic valve was  grossly normal. Pulmonic valve regurgitation is  mild by color flow Doppler.  10. The aortic root is normal in size     07/02/2017: [facility]  Severe mitral prosthesis stenosis with calculated valve area of 1.02 cm square.  This is based upon a mean gradient across the mitral valve of 18.8 mmHg.  No mitral regurgitation is noted by left ventriculography.  Mild pulmonary hypertension with mean pressure 30 mmHg (42/19 mmHg).  Inferobasal dyskinesis with akinesis of the mid and apical inferior wall.  EF 30-35%.  Normal anterior wall motion.  Normal LVEDP.  Widely patent left main, LAD, and circumflex coronary artery.  The second obtuse marginal contains eccentric proximal 50% narrowing.  Dominant right coronary with mid vessel 80% segmental stenosis with appearance of dissection representing recanalized total occlusion from the culprit event in 2011.  The patient is not having angina and the inferior wall is akinetic.  No indication for PCI in absence of ischemic symptoms.  RECOMMENDATIONS:   Continue medical therapy for LV systolic dysfunction  Refer for surgical opinion concerning valve replacement.  Balloon valvotomy in the setting of bioprosthetic valve is not an option due to high risk of valve leaflet tearing and severe mitral regurgitation.      Recent Labs: 06/12/2020: ALT 8; BUN 14; Creatinine 1.13; Hemoglobin 10.7; Platelet Count 188; Potassium 3.2; Sodium 138  No  results found for requested labs within last 8760 hours.   CrCl cannot be calculated (Patient's most recent lab result is older than the maximum 21 days allowed.).   Wt Readings from Last 3 Encounters:  07/25/20 181 lb (82.1 kg)  06/12/20 178 lb (80.7 kg)  04/03/20 179 lb (81.2 kg)     Other studies reviewed: Additional studies/records reviewed today include: summarized above  ASSESSMENT AND PLAN:  1. ICD     S/p gen change, well healed pocket     Intact function, no programming changes made  2. CAD     On ASA, BB     C/w Dr. Tamala Julian  3. ICM 4. Chronic CHF     No symptoms or exam findings to suggest volume OL     CorVue looks OK     On BB, diuretic tx  5. Paroxysmal AFib     CHA2DS2Vasc is 3, on warfarin     0%     6. VHD     S/p bioprosthetcic MVR >> stenosis >> TMVR     Echo tday via DR. Smith  7. NSVT     Recheck BMET today  Disposition: F/u with remotes as usual and in clinic with EP in a year, sooner if needed  Current medicines are reviewed at length with the patient today.  The patient did not have any concerns regarding medicines.  Venetia Night, PA-C 08/13/2020 9:28 AM     [address] [place] [address] [phone] (office)  [phone] (fax)

## 2020-08-14 ENCOUNTER — Ambulatory Visit (INDEPENDENT_AMBULATORY_CARE_PROVIDER_SITE_OTHER): Payer: 59

## 2020-08-14 ENCOUNTER — Other Ambulatory Visit: Payer: Self-pay

## 2020-08-14 ENCOUNTER — Ambulatory Visit (HOSPITAL_COMMUNITY): Payer: 59 | Attending: Interventional Cardiology

## 2020-08-14 ENCOUNTER — Encounter: Payer: Self-pay | Admitting: Physician Assistant

## 2020-08-14 ENCOUNTER — Ambulatory Visit (INDEPENDENT_AMBULATORY_CARE_PROVIDER_SITE_OTHER): Payer: 59 | Admitting: Physician Assistant

## 2020-08-14 ENCOUNTER — Telehealth: Payer: Self-pay | Admitting: Hematology & Oncology

## 2020-08-14 VITALS — BP 110/62 | HR 88 | Ht 66.0 in | Wt 179.0 lb

## 2020-08-14 DIAGNOSIS — I480 Paroxysmal atrial fibrillation: Secondary | ICD-10-CM

## 2020-08-14 DIAGNOSIS — I4729 Other ventricular tachycardia: Secondary | ICD-10-CM

## 2020-08-14 DIAGNOSIS — I472 Ventricular tachycardia: Secondary | ICD-10-CM

## 2020-08-14 DIAGNOSIS — Z952 Presence of prosthetic heart valve: Secondary | ICD-10-CM | POA: Diagnosis not present

## 2020-08-14 DIAGNOSIS — I5022 Chronic systolic (congestive) heart failure: Secondary | ICD-10-CM | POA: Diagnosis not present

## 2020-08-14 DIAGNOSIS — Z79899 Other long term (current) drug therapy: Secondary | ICD-10-CM

## 2020-08-14 DIAGNOSIS — Z953 Presence of xenogenic heart valve: Secondary | ICD-10-CM | POA: Diagnosis not present

## 2020-08-14 DIAGNOSIS — Z95810 Presence of automatic (implantable) cardiac defibrillator: Secondary | ICD-10-CM

## 2020-08-14 LAB — CUP PACEART INCLINIC DEVICE CHECK
Battery Remaining Longevity: 103 mo
Brady Statistic RA Percent Paced: 0 %
Brady Statistic RV Percent Paced: 0.03 %
Date Time Interrogation Session: 20220221155404
HighPow Impedance: 67.5 Ohm
Implantable Lead Implant Date: 20110503
Implantable Lead Implant Date: 20130815
Implantable Lead Location: 753859
Implantable Lead Location: 753860
Implantable Pulse Generator Implant Date: 20211012
Lead Channel Impedance Value: 262.5 Ohm
Lead Channel Impedance Value: 387.5 Ohm
Lead Channel Pacing Threshold Amplitude: 0.25 V
Lead Channel Pacing Threshold Amplitude: 0.25 V
Lead Channel Pacing Threshold Amplitude: 1.625 V
Lead Channel Pacing Threshold Pulse Width: 0.5 ms
Lead Channel Pacing Threshold Pulse Width: 0.5 ms
Lead Channel Pacing Threshold Pulse Width: 1 ms
Lead Channel Sensing Intrinsic Amplitude: 12 mV
Lead Channel Sensing Intrinsic Amplitude: 2 mV
Lead Channel Setting Pacing Amplitude: 1.875
Lead Channel Setting Pacing Amplitude: 2 V
Lead Channel Setting Pacing Pulse Width: 1 ms
Lead Channel Setting Sensing Sensitivity: 0.5 mV
Pulse Gen Serial Number: 111028151

## 2020-08-14 LAB — ECHOCARDIOGRAM COMPLETE
Area-P 1/2: 4.68 cm2
Height: 66 in
MV VTI: 2.17 cm2
S' Lateral: 4.4 cm
Weight: 2864 oz

## 2020-08-14 LAB — POCT INR: INR: 2.5 (ref 2.0–3.0)

## 2020-08-14 MED ORDER — POTASSIUM CHLORIDE ER 20 MEQ PO TBCR: 20.0000 meq | EXTENDED_RELEASE_TABLET | Freq: Every day | ORAL | 3 refills | Status: DC

## 2020-08-14 MED ORDER — PERFLUTREN LIPID MICROSPHERE
1.0000 mL | INTRAVENOUS | Status: AC | PRN
  Administered 2020-08-14: 1 mL via INTRAVENOUS

## 2020-08-14 NOTE — Patient Instructions (Signed)
Description   Continue taking 1 tablet (7.5mg ) daily except 1/2 tablet (3.75mg ) on Mondays and Fridays. Recheck INR in 5 weeks. Continue normal dark green consistency. [facility] [phone] Main [phone]

## 2020-08-14 NOTE — Telephone Encounter (Signed)
I called [name] this afternoon.  I left a message on her cell phone.  I told her that the CT scan that she had done did show one tumor that was growing.  She has others that are stable.  I am sure this is why she has the elevated inhibin B level.  I am not sure what we need to do at this point.  I will have to speak to gynecology about this.  I do know she would need surgery for resection of the growing tumor.  She has had quite a bit of chemotherapy already.  She is post to have a Port-A-Cath taken out next week.  We may have to put a hold on this until we know what is going to be the best way to go.  I told her to give my nurse a call back to let us know that she got the message.  Lattie Haw, MD

## 2020-08-14 NOTE — Patient Instructions (Addendum)
Medication Instructions:   Your physician recommends that you continue on your current medications as directed. Please refer to the Current Medication list given to you today.   *If you need a refill on your cardiac medications before your next appointment, please call your pharmacy*   Lab Work: BMET  TODAY   If you have labs (blood work) drawn today and your tests are completely normal, you will receive your results only by: Marland Kitchen MyChart Message (if you have MyChart) OR . A paper copy in the mail If you have any lab test that is abnormal or we need to change your treatment, we will call you to review the results.   Testing/Procedures: NONE ORDERED  TODAY     Follow-Up: At [facility], you and your health needs are our priority.  As part of our continuing mission to provide you with exceptional heart care, we have created designated Provider Care Teams.  These Care Teams include your primary Cardiologist (physician) and Advanced Practice Providers (APPs -  Physician Assistants and Nurse Practitioners) who all work together to provide you with the care you need, when you need it.  We recommend signing up for the patient portal called "MyChart".  Sign up information is provided on this After Visit Summary.  MyChart is used to connect with patients for Virtual Visits (Telemedicine).  Patients are able to view lab/test results, encounter notes, upcoming appointments, etc.  Non-urgent messages can be sent to your provider as well.   To learn more about what you can do with MyChart, go to [email or web address].    Your next appointment:   1 year(s)  The format for your next appointment:   In Person  Provider:   You may see Dr. Lovena Le or one of the following Advanced Practice Providers on your designated Care Team:    Chanetta Marshall, NP  Tommye Standard, PA-C  Legrand Como "Oda Kilts, [place]    Other Instructions

## 2020-08-15 ENCOUNTER — Other Ambulatory Visit: Payer: Self-pay | Admitting: Hematology & Oncology

## 2020-08-15 DIAGNOSIS — D3910 Neoplasm of uncertain behavior of unspecified ovary: Secondary | ICD-10-CM

## 2020-08-15 LAB — BASIC METABOLIC PANEL
BUN/Creatinine Ratio: 14 (ref 12–28)
BUN: 16 mg/dL (ref 8–27)
CO2: 21 mmol/L (ref 20–29)
Calcium: 9.3 mg/dL (ref 8.7–10.3)
Chloride: 102 mmol/L (ref 96–106)
Creatinine, Ser: 1.12 mg/dL — ABNORMAL HIGH (ref 0.57–1.00)
GFR calc Af Amer: 62 mL/min/{1.73_m2} (ref >59)
GFR calc non Af Amer: 54 mL/min/{1.73_m2} — ABNORMAL LOW (ref >59)
Glucose: 93 mg/dL (ref 65–99)
Potassium: 4.3 mmol/L (ref 3.5–5.2)
Sodium: 139 mmol/L (ref 134–144)

## 2020-08-16 ENCOUNTER — Encounter: Payer: Self-pay | Admitting: Hematology & Oncology

## 2020-08-16 ENCOUNTER — Inpatient Hospital Stay: Payer: 59

## 2020-08-16 ENCOUNTER — Other Ambulatory Visit: Payer: Self-pay

## 2020-08-16 ENCOUNTER — Inpatient Hospital Stay: Payer: 59 | Attending: Hematology & Oncology | Admitting: Hematology & Oncology

## 2020-08-16 ENCOUNTER — Telehealth: Payer: Self-pay

## 2020-08-16 VITALS — BP 92/61 | HR 80 | Temp 98.5°F | Resp 18 | Wt 178.0 lb

## 2020-08-16 DIAGNOSIS — D500 Iron deficiency anemia secondary to blood loss (chronic): Secondary | ICD-10-CM

## 2020-08-16 DIAGNOSIS — Z95828 Presence of other vascular implants and grafts: Secondary | ICD-10-CM

## 2020-08-16 DIAGNOSIS — M8000XS Age-related osteoporosis with current pathological fracture, unspecified site, sequela: Secondary | ICD-10-CM | POA: Diagnosis not present

## 2020-08-16 DIAGNOSIS — D3910 Neoplasm of uncertain behavior of unspecified ovary: Secondary | ICD-10-CM

## 2020-08-16 DIAGNOSIS — N1831 Chronic kidney disease, stage 3a: Secondary | ICD-10-CM

## 2020-08-16 DIAGNOSIS — C569 Malignant neoplasm of unspecified ovary: Secondary | ICD-10-CM | POA: Insufficient documentation

## 2020-08-16 HISTORY — DX: Iron deficiency anemia secondary to blood loss (chronic): D50.0

## 2020-08-16 LAB — CMP (CANCER CENTER ONLY)
ALT: 11 U/L (ref 0–44)
AST: 14 U/L — ABNORMAL LOW (ref 15–41)
Albumin: 3.9 g/dL (ref 3.5–5.0)
Alkaline Phosphatase: 48 U/L (ref 38–126)
Anion gap: 8 (ref 5–15)
BUN: 20 mg/dL (ref 6–20)
CO2: 29 mmol/L (ref 22–32)
Calcium: 9.2 mg/dL (ref 8.9–10.3)
Chloride: 102 mmol/L (ref 98–111)
Creatinine: 1.19 mg/dL — ABNORMAL HIGH (ref 0.44–1.00)
GFR, Estimated: 52 mL/min — ABNORMAL LOW (ref >60)
Glucose, Bld: 79 mg/dL (ref 70–99)
Potassium: 3.2 mmol/L — ABNORMAL LOW (ref 3.5–5.1)
Sodium: 139 mmol/L (ref 135–145)
Total Bilirubin: 0.3 mg/dL (ref 0.3–1.2)
Total Protein: 6.1 g/dL — ABNORMAL LOW (ref 6.5–8.1)

## 2020-08-16 LAB — CBC WITH DIFFERENTIAL (CANCER CENTER ONLY)
Abs Immature Granulocytes: 0.13 10*3/uL — ABNORMAL HIGH (ref 0.00–0.07)
Basophils Absolute: 0 10*3/uL (ref 0.0–0.1)
Basophils Relative: 0 %
Eosinophils Absolute: 0 10*3/uL (ref 0.0–0.5)
Eosinophils Relative: 0 %
HCT: 31.2 % — ABNORMAL LOW (ref 36.0–46.0)
Hemoglobin: 9.7 g/dL — ABNORMAL LOW (ref 12.0–15.0)
Immature Granulocytes: 1 %
Lymphocytes Relative: 15 %
Lymphs Abs: 1.5 10*3/uL (ref 0.7–4.0)
MCH: 25.5 pg — ABNORMAL LOW (ref 26.0–34.0)
MCHC: 31.1 g/dL (ref 30.0–36.0)
MCV: 81.9 fL (ref 80.0–100.0)
Monocytes Absolute: 0.9 10*3/uL (ref 0.1–1.0)
Monocytes Relative: 10 %
Neutro Abs: 7 10*3/uL (ref 1.7–7.7)
Neutrophils Relative %: 74 %
Platelet Count: 209 10*3/uL (ref 150–400)
RBC: 3.81 MIL/uL — ABNORMAL LOW (ref 3.87–5.11)
RDW: 17.9 % — ABNORMAL HIGH (ref 11.5–15.5)
WBC Count: 9.5 10*3/uL (ref 4.0–10.5)
nRBC: 0 % (ref 0.0–0.2)

## 2020-08-16 LAB — IRON AND TIBC
Iron: 31 ug/dL — ABNORMAL LOW (ref 41–142)
Saturation Ratios: 7 % — ABNORMAL LOW (ref 21–57)
TIBC: 419 ug/dL (ref 236–444)
UIBC: 387 ug/dL — ABNORMAL HIGH (ref 120–384)

## 2020-08-16 LAB — RETICULOCYTES
Immature Retic Fract: 31.6 % — ABNORMAL HIGH (ref 2.3–15.9)
RBC.: 3.75 MIL/uL — ABNORMAL LOW (ref 3.87–5.11)
Retic Count, Absolute: 83.3 10*3/uL (ref 19.0–186.0)
Retic Ct Pct: 2.2 % (ref 0.4–3.1)

## 2020-08-16 LAB — FERRITIN: Ferritin: 32 ng/mL (ref 11–307)

## 2020-08-16 MED ORDER — LIDOCAINE-PRILOCAINE 2.5-2.5 % EX CREA: 1.0000 "application " | TOPICAL_CREAM | CUTANEOUS | 12 refills | Status: DC | PRN

## 2020-08-16 MED ORDER — HEPARIN SOD (PORK) LOCK FLUSH 100 UNIT/ML IV SOLN
500.0000 [IU] | Freq: Once | INTRAVENOUS | Status: AC
  Administered 2020-08-16: 500 [IU] via INTRAVENOUS
  Filled 2020-08-16: qty 5

## 2020-08-16 MED ORDER — EXEMESTANE 25 MG PO TABS: 25.0000 mg | ORAL_TABLET | Freq: Every day | ORAL | 12 refills | Status: DC

## 2020-08-16 MED ORDER — SODIUM CHLORIDE 0.9% FLUSH
10.0000 mL | Freq: Once | INTRAVENOUS | Status: AC
  Administered 2020-08-16: 10 mL via INTRAVENOUS
  Filled 2020-08-16: qty 10

## 2020-08-16 NOTE — Addendum Note (Signed)
Addended by: Volanda Napoleon on: 08/16/2020 05:19 PM   Modules accepted: Orders

## 2020-08-16 NOTE — Patient Instructions (Signed)

## 2020-08-16 NOTE — Progress Notes (Signed)
Hematology and Oncology Follow Up Visit  DEVANI ODONNEL [id number] 05-Sep-1960 60 y.o. 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 Giza is here today for follow-up.  Unfortunately, she has another recurrence.  This probably is going be her third recurrence.  We did a inhibin B level on her back in December.  It was up to 20.8.  We then did a CT scan on her.  This was done in January.  She had growth in a right paracolic gutter lesion.  All the other lesions were stable.  She has been through quite a bit of therapy already.  She has had chemotherapy.  She has had radiation therapy.  She has had surgery.  I think that a reasonable option would be hormonal therapy.  There are some studies looking at hormonal therapy with granulosa cell tumors.  I think that Aromasin would be a reasonable.  She is asymptomatic.  She does not have any abdominal pain.  There is no back pain.  She has had no change in bowel or bladder habits.  The big news is that she is expecting her first grandchild in July.  It will be a grandson.  She is still working.  She has been quite busy because of the pandemic.  She has had no problems with Coumadin.  She does have cardiac issues.  She is followed by cardiology.  She has had no cough or shortness of breath.  She has had no obvious bleeding.  There is been no leg swelling.  She is little bit more anemic.  Her iron levels, I am sure, are down.  We will see if they are.  Overall, performance status is ECOG 1.     Medications:  Allergies as of 08/16/2020      Reactions   Augmentin [amoxicillin-pot Clavulanate] Other (See Comments)   Muscle Aches   Prochlorperazine Edisylate Other (See Comments)   Nervous/ flutter/ shakes   Adhesive [tape] Hives    Redness/hives from adhesive tape( tolerates latex gloves); tolerates paper tape      Medication List       Accurate as of August 16, 2020 10:24 AM. If you have any questions, ask your nurse or doctor.        albuterol 108 (90 Base) MCG/ACT inhaler Commonly known as: VENTOLIN HFA TAKE 2 PUFFS BY MOUTH EVERY 6 HOURS AS NEEDED FOR WHEEZE OR SHORTNESS OF BREATH   amoxicillin 500 MG tablet Commonly known as: AMOXIL Take 2,000 mg by mouth See admin instructions. Take 2,000 mg one hour prior to dental visits.   aspirin EC 81 MG tablet Take 81 mg by mouth daily.   b complex vitamins capsule Take 1 capsule by mouth 2 (two) times daily.   carvedilol 3.125 MG tablet Commonly known as: COREG TAKE 1 TABLET (3.125 MG TOTAL) BY MOUTH 2 (TWO) TIMES DAILY WITH A MEAL.   cholecalciferol 25 MCG (1000 UNIT) tablet Commonly known as: VITAMIN D3 Take 4,000 Units by mouth daily.   exemestane 25 MG tablet Commonly known as: AROMASIN Take 1 tablet (25 mg total) by mouth daily after breakfast. Started by: Volanda Napoleon, MD   furosemide 20 MG tablet Commonly known as: LASIX TAKE 1 TABLET BY MOUTH EVERY DAY   lidocaine-prilocaine cream Commonly  known as: EMLA Apply 1 application topically as needed. Apply to port per instructions   loperamide 2 MG tablet Commonly known as: IMODIUM A-D Take 4 mg by mouth 3 (three) times daily as needed for diarrhea or loose stools.   LORazepam 0.5 MG tablet Commonly known as: ATIVAN TAKE 1 TABLET (0.5 MG TOTAL) BY MOUTH EVERY 6 (SIX) HOURS AS NEEDED. FOR ANXIETY   methocarbamol 500 MG tablet Commonly known as: ROBAXIN TAKE 1 TABLET BY MOUTH EVERY 6 HOURS AS NEEDED FOR MUSCLE SPASMS.   Potassium Chloride ER 20 MEQ Tbcr Take 20 mEq by mouth daily.   spironolactone 25 MG tablet Commonly known as: ALDACTONE Take 1 tablet (25 mg total) by mouth daily.   traMADol 50 MG tablet Commonly known as: ULTRAM TAKE 1 TABLET BY MOUTH EVERY 6 HOURS AS  NEEDED   Vitamin D (Ergocalciferol) 1.25 MG (50000 UNIT) Caps capsule Commonly known as: DRISDOL TAKE 1 CAPSULE (50,000 UNITS TOTAL) BY MOUTH EVERY 7 (SEVEN) DAYS.   warfarin 7.5 MG tablet Commonly known as: COUMADIN Take as directed by the anticoagulation clinic. If you are unsure how to take this medication, talk to your nurse or doctor. Original instructions: TAKE AS DIRECTED BY ANTICOAGULATION CLINIC.   zolpidem 10 MG tablet Commonly known as: AMBIEN TAKE 1 TABLET BY MOUTH EVERYDAY AT BEDTIME       Allergies:  Allergies  Allergen Reactions  . Augmentin [Amoxicillin-Pot Clavulanate] Other (See Comments)    Muscle Aches  . Prochlorperazine Edisylate Other (See Comments)    Nervous/ flutter/ shakes  . Adhesive [Tape] Hives    Redness/hives from adhesive tape( tolerates latex gloves); tolerates paper tape    Past Medical History, Surgical history, Social history, and Family History were reviewed and updated.  Review of Systems:  Review of Systems  Constitutional: Negative.   HENT: Negative.   Eyes: Negative.   Respiratory: Negative.   Cardiovascular: Negative.   Gastrointestinal: Negative.   Genitourinary: Negative.   Musculoskeletal: Negative.   Skin: Negative.   Neurological: Negative.   Endo/Heme/Allergies: Negative.   Psychiatric/Behavioral: Negative.       Exam:  weight is 178 lb (80.7 kg). Her oral temperature is 98.5 F (36.9 C). Her blood pressure is 92/61 and her pulse is 80. Her respiration is 18 and oxygen saturation is 100%.   Wt Readings from Last 3 Encounters:  08/16/20 178 lb (80.7 kg)  08/14/20 179 lb (81.2 kg)  07/25/20 181 lb (82.1 kg)    Physical Exam Vitals reviewed.  HENT:     Head: Normocephalic and atraumatic.  Eyes:     Pupils: Pupils are equal, round, and reactive to light.  Cardiovascular:     Rate and Rhythm: Normal rate and regular rhythm.     Heart sounds: Normal heart sounds.  Pulmonary:     Effort: Pulmonary effort is  normal.     Breath sounds: Normal breath sounds.  Abdominal:     General: Bowel sounds are normal.     Palpations: Abdomen is soft.  Musculoskeletal:        General: No tenderness or deformity. Normal range of motion.     Cervical back: Normal range of motion.  Lymphadenopathy:     Cervical: No cervical adenopathy.  Skin:    General: Skin is warm and dry.     Findings: No erythema or rash.  Neurological:     Mental Status: She is alert and oriented to person, place, and time.  Psychiatric:  Behavior: Behavior normal.        Thought Content: Thought content normal.        Judgment: Judgment normal.      Lab Results  Component Value Date   WBC 9.5 08/16/2020   HGB 9.7 (L) 08/16/2020   HCT 31.2 (L) 08/16/2020   MCV 81.9 08/16/2020   PLT 209 08/16/2020   Lab Results  Component Value Date   FERRITIN 181 07/24/2018   IRON 69 07/24/2018   TIBC 286 07/24/2018   UIBC 217 07/24/2018   IRONPCTSAT 24 07/24/2018   Lab Results  Component Value Date   RETICCTPCT 2.2 08/16/2020   RBC 3.75 (L) 08/16/2020   No results found for: KPAFRELGTCHN, LAMBDASER, KAPLAMBRATIO No results found for: IGGSERUM, IGA, IGMSERUM No results found for: Odetta Pink, SPEI   Chemistry      Component Value Date/Time   NA 139 08/16/2020 0851   NA 139 08/14/2020 1428   NA 140 03/15/2016 0953   K 3.2 (L) 08/16/2020 0851   K 3.9 03/15/2016 0953   CL 102 08/16/2020 0851   CL 102 12/13/2015 0803   CO2 29 08/16/2020 0851   CO2 19 (L) 03/15/2016 0953   BUN 20 08/16/2020 0851   BUN 16 08/14/2020 1428   BUN 15.9 03/15/2016 0953   CREATININE 1.19 (H) 08/16/2020 0851   CREATININE 1.4 (H) 03/15/2016 0953      Component Value Date/Time   CALCIUM 9.2 08/16/2020 0851   CALCIUM 9.3 03/15/2016 0953   ALKPHOS 48 08/16/2020 0851   ALKPHOS 55 03/15/2016 0953   AST 14 (L) 08/16/2020 0851   AST 17 03/15/2016 0953   ALT 11 08/16/2020 0851   ALT 23  03/15/2016 0953   BILITOT 0.3 08/16/2020 0851   BILITOT 0.38 03/15/2016 0953       Impression and Plan: Ms. Cartwright is a very pleasant 60 yo caucasian female with recurrent granulosa cell tumor.  She has another recurrence of her granulosa cell tumor.  I think this is the third recurrence.  We will try her now on Aromasin.  I think this is reasonable.  It may take several months before we see a response.  I would not do a CAT scan probably for at least 3 months.  We will see what her inhibin B level is we see her back.  It would not surprise me this was elevated even further.  We will keep her Port-A-Cath in for right now.  I think we may have to utilize it.  Is been several years since she had a recurrence.  I probably would consider a biopsy if she does not respond to Aromasin and send the tumor off for molecular markers.  We will see what her iron level is.  I will plan to see her back in 6 weeks.   Volanda Napoleon, MD 2/23/202210:24 AM

## 2020-08-16 NOTE — Telephone Encounter (Signed)
Called pt per 08/16/20 los and we moved her already sch 10/11/20 appt to the 6 week mark, pt also stated that she will be keeping her port as there was an old referral in the system     [name]

## 2020-08-17 ENCOUNTER — Telehealth: Payer: Self-pay

## 2020-08-17 LAB — ERYTHROPOIETIN: Erythropoietin: 59.7 m[IU]/mL — ABNORMAL HIGH (ref 2.6–18.5)

## 2020-08-17 NOTE — Telephone Encounter (Signed)
Called pt per 08/17/20 result note and left a vm with two iron tx appts      [name]

## 2020-08-17 NOTE — Telephone Encounter (Signed)
Called and left message with patient regarding iron levels and informed her scheduling will reach out to schedule.   In basket sent to scheduling.

## 2020-08-17 NOTE — Telephone Encounter (Signed)
-----   Message from Stacy Napoleon, MD sent at 08/16/2020  3:29 PM EST ----- Call - the iron is very low!!!  You actually need some IV iron to help this!!  Please set this up!!  [name]

## 2020-08-22 ENCOUNTER — Other Ambulatory Visit (HOSPITAL_COMMUNITY): Payer: 59

## 2020-08-22 ENCOUNTER — Ambulatory Visit (HOSPITAL_COMMUNITY): Payer: 59

## 2020-08-22 ENCOUNTER — Inpatient Hospital Stay: Payer: 59 | Attending: Hematology & Oncology

## 2020-08-22 ENCOUNTER — Other Ambulatory Visit: Payer: Self-pay

## 2020-08-22 VITALS — BP 100/52 | HR 87 | Temp 98.2°F | Resp 17

## 2020-08-22 DIAGNOSIS — T451X5A Adverse effect of antineoplastic and immunosuppressive drugs, initial encounter: Secondary | ICD-10-CM | POA: Diagnosis not present

## 2020-08-22 DIAGNOSIS — C569 Malignant neoplasm of unspecified ovary: Secondary | ICD-10-CM | POA: Insufficient documentation

## 2020-08-22 DIAGNOSIS — D6481 Anemia due to antineoplastic chemotherapy: Secondary | ICD-10-CM | POA: Insufficient documentation

## 2020-08-22 LAB — INHIBIN B: Inhibin B: 12.1 pg/mL (ref 0.0–16.9)

## 2020-08-22 MED ORDER — LORATADINE 10 MG PO TABS
10.0000 mg | ORAL_TABLET | Freq: Once | ORAL | Status: AC
  Administered 2020-08-22: 10 mg via ORAL
  Filled 2020-08-22: qty 1

## 2020-08-22 MED ORDER — SODIUM CHLORIDE 0.9% FLUSH
10.0000 mL | Freq: Once | INTRAVENOUS | Status: AC
  Administered 2020-08-22: 10 mL via INTRAVENOUS
  Filled 2020-08-22: qty 10

## 2020-08-22 MED ORDER — SODIUM CHLORIDE 0.9 % IV SOLN
Freq: Once | INTRAVENOUS | Status: AC
  Filled 2020-08-22: qty 250

## 2020-08-22 MED ORDER — HEPARIN SOD (PORK) LOCK FLUSH 100 UNIT/ML IV SOLN
500.0000 [IU] | Freq: Once | INTRAVENOUS | Status: AC
  Administered 2020-08-22: 500 [IU] via INTRAVENOUS
  Filled 2020-08-22: qty 5

## 2020-08-22 MED ORDER — SODIUM CHLORIDE 0.9 % IV SOLN
750.0000 mg | Freq: Once | INTRAVENOUS | Status: AC
  Administered 2020-08-22: 750 mg via INTRAVENOUS
  Filled 2020-08-22: qty 15

## 2020-08-26 ENCOUNTER — Other Ambulatory Visit: Payer: Self-pay | Admitting: Hematology & Oncology

## 2020-08-28 ENCOUNTER — Other Ambulatory Visit: Payer: Self-pay | Admitting: Hematology & Oncology

## 2020-08-28 ENCOUNTER — Other Ambulatory Visit: Payer: Self-pay

## 2020-08-28 ENCOUNTER — Inpatient Hospital Stay: Payer: 59

## 2020-08-28 VITALS — BP 124/70 | HR 78 | Resp 17

## 2020-08-28 DIAGNOSIS — D6481 Anemia due to antineoplastic chemotherapy: Secondary | ICD-10-CM | POA: Diagnosis not present

## 2020-08-28 DIAGNOSIS — F419 Anxiety disorder, unspecified: Secondary | ICD-10-CM

## 2020-08-28 MED ORDER — SODIUM CHLORIDE 0.9 % IV SOLN
750.0000 mg | Freq: Once | INTRAVENOUS | Status: AC
  Administered 2020-08-28: 750 mg via INTRAVENOUS
  Filled 2020-08-28: qty 15

## 2020-08-28 MED ORDER — LORATADINE 10 MG PO TABS
10.0000 mg | ORAL_TABLET | Freq: Once | ORAL | Status: AC
  Administered 2020-08-28: 10 mg via ORAL
  Filled 2020-08-28: qty 1

## 2020-08-28 MED ORDER — SODIUM CHLORIDE 0.9 % IV SOLN
Freq: Once | INTRAVENOUS | Status: AC
  Filled 2020-08-28: qty 250

## 2020-08-28 NOTE — Patient Instructions (Signed)
Ferric carboxymaltose injection What is this medicine? FERRIC CARBOXYMALTOSE (ferr-ik car-box-ee-mol-toes) is an iron complex. Iron is used to make healthy red blood cells, which carry oxygen and nutrients throughout the body. This medicine is used to treat anemia in people with chronic kidney disease or people who cannot take iron by mouth. This medicine may be used for other purposes; ask your health care provider or pharmacist if you have questions. COMMON BRAND NAME(S): Injectafer What should I tell my health care provider before I take this medicine? They need to know if you have any of these conditions:  high levels of iron in the blood  liver disease  an unusual or allergic reaction to iron, other medicines, foods, dyes, or preservatives  pregnant or trying to get pregnant  breast-feeding How should I use this medicine? This medicine is for infusion into a vein. It is given by a health care professional in a hospital or clinic setting. Talk to your pediatrician regarding the use of this medicine in children. Special care may be needed. Overdosage: If you think you have taken too much of this medicine contact a poison control center or emergency room at once. NOTE: This medicine is only for you. Do not share this medicine with others. What if I miss a dose? Keep appointments for follow-up doses. It is important not to miss your dose. Call your care team if you are unable to keep an appointment. What may interact with this medicine? Do not take this medicine with any of the following medications:  deferoxamine  dimercaprol  other iron products This list may not describe all possible interactions. Give your health care provider a list of all the medicines, herbs, non-prescription drugs, or dietary supplements you use. Also tell them if you smoke, drink alcohol, or use illegal drugs. Some items may interact with your medicine. What should I watch for while using this  medicine? Visit your doctor or health care professional regularly. Tell your doctor if your symptoms do not start to get better or if they get worse. You may need blood work done while you are taking this medicine. You may need to follow a special diet. Talk to your doctor. Foods that contain iron include: whole grains/cereals, dried fruits, beans, or peas, leafy green vegetables, and organ meats (liver, kidney). What side effects may I notice from receiving this medicine? Side effects that you should report to your doctor or health care professional as soon as possible:  allergic reactions like skin rash, itching or hives, swelling of the face, lips, or tongue  dizziness  facial flushing Side effects that usually do not require medical attention (report to your doctor or health care professional if they continue or are bothersome):  changes in taste  constipation  headache  nausea, vomiting  pain, redness, or irritation at site where injected This list may not describe all possible side effects. Call your doctor for medical advice about side effects. You may report side effects to FDA at 1-800-FDA-1088. Where should I keep my medicine? This drug is given in a hospital or clinic and will not be stored at home. NOTE: This sheet is a summary. It may not cover all possible information. If you have questions about this medicine, talk to your doctor, pharmacist, or health care provider.  2021 Elsevier/Gold Standard (2020-05-23 14:00:47)  

## 2020-09-07 ENCOUNTER — Other Ambulatory Visit: Payer: Self-pay | Admitting: Hematology & Oncology

## 2020-09-08 ENCOUNTER — Other Ambulatory Visit: Payer: Self-pay | Admitting: Hematology & Oncology

## 2020-09-08 DIAGNOSIS — D3910 Neoplasm of uncertain behavior of unspecified ovary: Secondary | ICD-10-CM

## 2020-09-12 ENCOUNTER — Other Ambulatory Visit: Payer: Self-pay | Admitting: Hematology & Oncology

## 2020-09-12 ENCOUNTER — Ambulatory Visit (HOSPITAL_BASED_OUTPATIENT_CLINIC_OR_DEPARTMENT_OTHER)
Admission: RE | Admit: 2020-09-12 | Discharge: 2020-09-12 | Disposition: A | Discharge status: Home / Self Care | Payer: 59 | Source: Ambulatory Visit | Attending: Hematology & Oncology | Admitting: Hematology & Oncology

## 2020-09-12 ENCOUNTER — Other Ambulatory Visit: Payer: Self-pay

## 2020-09-12 DIAGNOSIS — D3910 Neoplasm of uncertain behavior of unspecified ovary: Secondary | ICD-10-CM

## 2020-09-12 DIAGNOSIS — M8000XS Age-related osteoporosis with current pathological fracture, unspecified site, sequela: Secondary | ICD-10-CM | POA: Diagnosis not present

## 2020-09-13 ENCOUNTER — Telehealth: Payer: Self-pay | Admitting: *Deleted

## 2020-09-13 NOTE — Telephone Encounter (Signed)
-----   Message from Volanda Napoleon, MD sent at 09/13/2020  6:33 AM EDT ----- Call - the bone density shows that the bones have osteopenia.  She needs Reclast once a year.  Can you please set this up??  Thanks!!   [name]

## 2020-09-13 NOTE — Telephone Encounter (Signed)
Notified pt of results 

## 2020-09-18 ENCOUNTER — Other Ambulatory Visit: Payer: Self-pay | Admitting: Hematology & Oncology

## 2020-09-18 DIAGNOSIS — F419 Anxiety disorder, unspecified: Secondary | ICD-10-CM

## 2020-09-19 ENCOUNTER — Other Ambulatory Visit: Payer: Self-pay | Admitting: *Deleted

## 2020-09-19 DIAGNOSIS — F419 Anxiety disorder, unspecified: Secondary | ICD-10-CM

## 2020-09-19 MED ORDER — LORAZEPAM 0.5 MG PO TABS: ORAL_TABLET | ORAL | 0 refills | Status: DC

## 2020-09-20 ENCOUNTER — Other Ambulatory Visit: Payer: Self-pay | Admitting: Interventional Cardiology

## 2020-09-20 DIAGNOSIS — D3910 Neoplasm of uncertain behavior of unspecified ovary: Secondary | ICD-10-CM

## 2020-09-21 ENCOUNTER — Other Ambulatory Visit: Payer: Self-pay | Admitting: *Deleted

## 2020-09-21 ENCOUNTER — Other Ambulatory Visit: Payer: Self-pay

## 2020-09-21 ENCOUNTER — Inpatient Hospital Stay: Payer: 59

## 2020-09-21 VITALS — BP 128/60 | HR 89 | Temp 98.3°F | Resp 19

## 2020-09-21 DIAGNOSIS — D6481 Anemia due to antineoplastic chemotherapy: Secondary | ICD-10-CM

## 2020-09-21 DIAGNOSIS — Z95828 Presence of other vascular implants and grafts: Secondary | ICD-10-CM

## 2020-09-21 DIAGNOSIS — T451X5A Adverse effect of antineoplastic and immunosuppressive drugs, initial encounter: Secondary | ICD-10-CM

## 2020-09-21 LAB — CBC WITH DIFFERENTIAL (CANCER CENTER ONLY)
Abs Immature Granulocytes: 0.03 10*3/uL (ref 0.00–0.07)
Basophils Absolute: 0 10*3/uL (ref 0.0–0.1)
Basophils Relative: 1 %
Eosinophils Absolute: 0.1 10*3/uL (ref 0.0–0.5)
Eosinophils Relative: 1 %
HCT: 37.2 % (ref 36.0–46.0)
Hemoglobin: 12.2 g/dL (ref 12.0–15.0)
Immature Granulocytes: 0 %
Lymphocytes Relative: 11 %
Lymphs Abs: 0.9 10*3/uL (ref 0.7–4.0)
MCH: 28.2 pg (ref 26.0–34.0)
MCHC: 32.8 g/dL (ref 30.0–36.0)
MCV: 85.9 fL (ref 80.0–100.0)
Monocytes Absolute: 0.8 10*3/uL (ref 0.1–1.0)
Monocytes Relative: 10 %
Neutro Abs: 6.5 10*3/uL (ref 1.7–7.7)
Neutrophils Relative %: 77 %
Platelet Count: 149 10*3/uL — ABNORMAL LOW (ref 150–400)
RBC: 4.33 MIL/uL (ref 3.87–5.11)
RDW: 21.7 % — ABNORMAL HIGH (ref 11.5–15.5)
WBC Count: 8.5 10*3/uL (ref 4.0–10.5)
nRBC: 0 % (ref 0.0–0.2)

## 2020-09-21 LAB — COMPREHENSIVE METABOLIC PANEL
ALT: 10 U/L (ref 0–44)
AST: 17 U/L (ref 15–41)
Albumin: 4.1 g/dL (ref 3.5–5.0)
Alkaline Phosphatase: 52 U/L (ref 38–126)
Anion gap: 10 (ref 5–15)
BUN: 10 mg/dL (ref 8–23)
CO2: 28 mmol/L (ref 22–32)
Calcium: 8.9 mg/dL (ref 8.9–10.3)
Chloride: 101 mmol/L (ref 98–111)
Creatinine, Ser: 1.16 mg/dL — ABNORMAL HIGH (ref 0.44–1.00)
GFR, Estimated: 54 mL/min — ABNORMAL LOW (ref >60)
Glucose, Bld: 88 mg/dL (ref 70–99)
Potassium: 3.1 mmol/L — ABNORMAL LOW (ref 3.5–5.1)
Sodium: 139 mmol/L (ref 135–145)
Total Bilirubin: 0.3 mg/dL (ref 0.3–1.2)
Total Protein: 6.4 g/dL — ABNORMAL LOW (ref 6.5–8.1)

## 2020-09-21 MED ORDER — SODIUM CHLORIDE 0.9 % IV SOLN
Freq: Once | INTRAVENOUS | Status: AC
  Filled 2020-09-21: qty 250

## 2020-09-21 MED ORDER — SODIUM CHLORIDE 0.9% FLUSH
10.0000 mL | Freq: Once | INTRAVENOUS | Status: AC
  Administered 2020-09-21: 10 mL via INTRAVENOUS
  Filled 2020-09-21: qty 10

## 2020-09-21 MED ORDER — ZOLEDRONIC ACID 4 MG/100ML IV SOLN
4.0000 mg | Freq: Once | INTRAVENOUS | Status: AC
  Administered 2020-09-21: 4 mg via INTRAVENOUS
  Filled 2020-09-21: qty 100

## 2020-09-21 NOTE — Patient Instructions (Signed)
Tunneled Central Venous Catheter Flushing Guide  It is important to flush your tunneled central venous catheter each time you use it, both before and after you use it. Flushing your catheter will help prevent it from clogging. What are the risks? Risks may include:  Infection.  Air getting into the catheter and bloodstream. Supplies needed:  A clean pair of gloves.  A disinfecting wipe. Use an alcohol wipe, chlorhexidine wipe, or iodine wipe as told by your health care provider.  A 10 mL syringe that has been prefilled with saline solution.  An empty 10 mL syringe, if a substance called heparin was injected into your catheter. How to flush your catheter When you flush your catheter, make sure you follow any specific instructions from your health care provider or the manufacturer. These are general guidelines. Flushing your catheter before use If there is heparin in your catheter: 1. Wash your hands with soap and water. 2. Put on gloves. 3. Scrub the injection cap for a minimum of 15 seconds with a disinfecting wipe. 4. Unclamp the catheter. 5. Attach the empty syringe to the injection cap. 6. Pull the syringe plunger back and withdraw 10 mL of blood. 7. Place the syringe into an appropriate waste container. 8. Scrub the injection cap for 15 seconds with a disinfecting wipe. 9. Attach the prefilled syringe to the injection cap. 10. Flush the catheter by pushing the plunger forward until all the liquid from the syringe is in the catheter. 11. Remove the syringe from the injection cap. 12. Clamp the catheter. If there is no heparin in your catheter: 1. Wash your hands with soap and water. 2. Put on gloves. 3. Scrub the injection cap for 15 seconds with a disinfecting wipe. 4. Unclamp the catheter. 5. Attach the prefilled syringe to the injection cap. 6. Flush the catheter by pushing the plunger forward until 5 mL of the liquid from the syringe is in the catheter. 7. Pull back on  the syringe until you see blood in the catheter. 8. If you have been asked to collect any blood, follow your health care provider's instructions. Otherwise, flush the catheter with the rest of the solution from the syringe. 9. Remove the syringe from the injection cap. 10. Clamp the catheter.   Flushing your catheter after use 1. Wash your hands with soap and water. 2. Put on gloves. 3. Scrub the injection cap for 15 seconds with a disinfecting wipe. 4. Unclamp the catheter. 5. Attach the prefilled syringe to the injection cap. 6. Flush the catheter by pushing the plunger forward until all of the liquid from the syringe is in the catheter. 7. Remove the syringe from the injection cap. 8. Clamp the catheter. Problems and solutions  If blood cannot be completely cleared from the injection cap, you may need to have the injection cap replaced.  If the catheter is difficult to flush, use the pulsing method. The pulsing method involves pushing only a few milliliters of solution into the catheter at a time and pausing between pushes.  If you do not see blood in the catheter when you pull back on the syringe, change your body position, such as by raising your arms above your head. Take a deep breath and cough. Then, pull back on the syringe. If you still do not see blood, flush the catheter with a small amount of solution. Then, change positions again and take a breath or cough. Pull back on the syringe again. If you still do not   see blood, finish flushing the catheter and contact your health care provider. Do not use your catheter until your health care provider says it is okay. General tips  Have someone help you flush your catheter, if possible.  Do not force fluid through your catheter.  Do not use a syringe that is larger or smaller than 10 mL. Using a smaller syringe can make the catheter burst.  Do not use your catheter without flushing it first if it has heparin in it. Contact a health  care provider if:  You cannot see any blood in the catheter when you flush it before using it.  Your catheter is difficult to flush. Get help right away if:  You cannot flush the catheter.  The catheter leaks when you flush it or when there is fluid in it.  There are cracks or breaks in the catheter. Summary  It is important to flush your tunneled central venous catheter each time you use it, both before and after you use it.  Scrub the injection cap for 15 seconds with a disinfecting wipe before and after you flush it.  When you flush your catheter, make sure you follow any specific instructions from your health care provider or the manufacturer.  Get help right away if you cannot flush the catheter. This information is not intended to replace advice given to you by your health care provider. Make sure you discuss any questions you have with your health care provider. Document Revised: 08/19/2019 Document Reviewed: 08/26/2018 Elsevier Patient Education  2021 Elsevier Inc.  

## 2020-09-22 ENCOUNTER — Ambulatory Visit (INDEPENDENT_AMBULATORY_CARE_PROVIDER_SITE_OTHER): Payer: 59 | Admitting: *Deleted

## 2020-09-22 DIAGNOSIS — Z5181 Encounter for therapeutic drug level monitoring: Secondary | ICD-10-CM

## 2020-09-22 DIAGNOSIS — I480 Paroxysmal atrial fibrillation: Secondary | ICD-10-CM | POA: Diagnosis not present

## 2020-09-22 DIAGNOSIS — Z953 Presence of xenogenic heart valve: Secondary | ICD-10-CM

## 2020-09-22 DIAGNOSIS — Z952 Presence of prosthetic heart valve: Secondary | ICD-10-CM

## 2020-09-22 LAB — POCT INR: INR: 3.2 — AB (ref 2.0–3.0)

## 2020-09-22 NOTE — Patient Instructions (Addendum)
Description   DOES NOT NEED A PRINTED SHEET. Do not take any Warfarin today then continue taking 1 tablet (7.5mg ) daily except 1/2 tablet (3.75mg ) on Mondays and Fridays. Recheck INR in 3 weeks. Continue normal dark green consistency. [facility] [phone] Main [phone]

## 2020-09-29 ENCOUNTER — Inpatient Hospital Stay: Payer: 59 | Admitting: Hematology & Oncology

## 2020-09-29 ENCOUNTER — Inpatient Hospital Stay: Payer: 59

## 2020-10-03 ENCOUNTER — Ambulatory Visit (INDEPENDENT_AMBULATORY_CARE_PROVIDER_SITE_OTHER): Payer: 59

## 2020-10-03 DIAGNOSIS — I4901 Ventricular fibrillation: Secondary | ICD-10-CM | POA: Diagnosis not present

## 2020-10-03 LAB — CUP PACEART REMOTE DEVICE CHECK
Battery Remaining Longevity: 97 mo
Battery Remaining Percentage: 93 %
Battery Voltage: 3.02 V
Brady Statistic AP VP Percent: 0 %
Brady Statistic AP VS Percent: 1 %
Brady Statistic AS VP Percent: 1 %
Brady Statistic AS VS Percent: 99 %
Brady Statistic RA Percent Paced: 1 %
Brady Statistic RV Percent Paced: 1 %
Date Time Interrogation Session: 20220412034428
HighPow Impedance: 68 Ohm
Implantable Lead Implant Date: 20110503
Implantable Lead Implant Date: 20130815
Implantable Lead Location: 753859
Implantable Lead Location: 753860
Implantable Pulse Generator Implant Date: 20211012
Lead Channel Impedance Value: 260 Ohm
Lead Channel Impedance Value: 390 Ohm
Lead Channel Pacing Threshold Amplitude: 0.25 V
Lead Channel Pacing Threshold Amplitude: 2.125 V
Lead Channel Pacing Threshold Pulse Width: 0.5 ms
Lead Channel Pacing Threshold Pulse Width: 1 ms
Lead Channel Sensing Intrinsic Amplitude: 11.9 mV
Lead Channel Sensing Intrinsic Amplitude: 2.1 mV
Lead Channel Setting Pacing Amplitude: 2 V
Lead Channel Setting Pacing Amplitude: 2.375
Lead Channel Setting Pacing Pulse Width: 1 ms
Lead Channel Setting Sensing Sensitivity: 0.5 mV
Pulse Gen Serial Number: 111028151

## 2020-10-04 ENCOUNTER — Other Ambulatory Visit: Payer: Self-pay | Admitting: Hematology & Oncology

## 2020-10-06 ENCOUNTER — Other Ambulatory Visit: Payer: Self-pay

## 2020-10-06 ENCOUNTER — Inpatient Hospital Stay: Payer: 59

## 2020-10-06 ENCOUNTER — Inpatient Hospital Stay (HOSPITAL_BASED_OUTPATIENT_CLINIC_OR_DEPARTMENT_OTHER): Payer: 59 | Admitting: Hematology & Oncology

## 2020-10-06 ENCOUNTER — Encounter: Payer: Self-pay | Admitting: *Deleted

## 2020-10-06 ENCOUNTER — Inpatient Hospital Stay: Payer: 59 | Attending: Hematology & Oncology

## 2020-10-06 ENCOUNTER — Encounter: Payer: Self-pay | Admitting: Hematology & Oncology

## 2020-10-06 VITALS — BP 107/73 | HR 86 | Temp 98.3°F | Resp 18 | Wt 170.0 lb

## 2020-10-06 DIAGNOSIS — D3910 Neoplasm of uncertain behavior of unspecified ovary: Secondary | ICD-10-CM | POA: Diagnosis not present

## 2020-10-06 DIAGNOSIS — T451X5D Adverse effect of antineoplastic and immunosuppressive drugs, subsequent encounter: Secondary | ICD-10-CM | POA: Diagnosis not present

## 2020-10-06 DIAGNOSIS — C569 Malignant neoplasm of unspecified ovary: Secondary | ICD-10-CM | POA: Diagnosis present

## 2020-10-06 DIAGNOSIS — D500 Iron deficiency anemia secondary to blood loss (chronic): Secondary | ICD-10-CM

## 2020-10-06 DIAGNOSIS — D6481 Anemia due to antineoplastic chemotherapy: Secondary | ICD-10-CM | POA: Insufficient documentation

## 2020-10-06 DIAGNOSIS — N1831 Chronic kidney disease, stage 3a: Secondary | ICD-10-CM

## 2020-10-06 DIAGNOSIS — Z95828 Presence of other vascular implants and grafts: Secondary | ICD-10-CM

## 2020-10-06 DIAGNOSIS — M8000XS Age-related osteoporosis with current pathological fracture, unspecified site, sequela: Secondary | ICD-10-CM

## 2020-10-06 LAB — IRON AND TIBC
Iron: 81 ug/dL (ref 41–142)
Saturation Ratios: 33 % (ref 21–57)
TIBC: 243 ug/dL (ref 236–444)
UIBC: 161 ug/dL (ref 120–384)

## 2020-10-06 LAB — CBC WITH DIFFERENTIAL (CANCER CENTER ONLY)
Abs Immature Granulocytes: 0.02 10*3/uL (ref 0.00–0.07)
Basophils Absolute: 0 10*3/uL (ref 0.0–0.1)
Basophils Relative: 0 %
Eosinophils Absolute: 0.1 10*3/uL (ref 0.0–0.5)
Eosinophils Relative: 2 %
HCT: 39 % (ref 36.0–46.0)
Hemoglobin: 13.1 g/dL (ref 12.0–15.0)
Immature Granulocytes: 0 %
Lymphocytes Relative: 9 %
Lymphs Abs: 0.6 10*3/uL — ABNORMAL LOW (ref 0.7–4.0)
MCH: 29.2 pg (ref 26.0–34.0)
MCHC: 33.6 g/dL (ref 30.0–36.0)
MCV: 87.1 fL (ref 80.0–100.0)
Monocytes Absolute: 0.8 10*3/uL (ref 0.1–1.0)
Monocytes Relative: 11 %
Neutro Abs: 5.3 10*3/uL (ref 1.7–7.7)
Neutrophils Relative %: 78 %
Platelet Count: 140 10*3/uL — ABNORMAL LOW (ref 150–400)
RBC: 4.48 MIL/uL (ref 3.87–5.11)
RDW: 21.2 % — ABNORMAL HIGH (ref 11.5–15.5)
WBC Count: 6.9 10*3/uL (ref 4.0–10.5)
nRBC: 0 % (ref 0.0–0.2)

## 2020-10-06 LAB — CMP (CANCER CENTER ONLY)
ALT: 9 U/L (ref 0–44)
AST: 14 U/L — ABNORMAL LOW (ref 15–41)
Albumin: 3.9 g/dL (ref 3.5–5.0)
Alkaline Phosphatase: 45 U/L (ref 38–126)
Anion gap: 7 (ref 5–15)
BUN: 10 mg/dL (ref 8–23)
CO2: 27 mmol/L (ref 22–32)
Calcium: 8.6 mg/dL — ABNORMAL LOW (ref 8.9–10.3)
Chloride: 105 mmol/L (ref 98–111)
Creatinine: 1.05 mg/dL — ABNORMAL HIGH (ref 0.44–1.00)
GFR, Estimated: >60 mL/min (ref >60)
Glucose, Bld: 85 mg/dL (ref 70–99)
Potassium: 3.4 mmol/L — ABNORMAL LOW (ref 3.5–5.1)
Sodium: 139 mmol/L (ref 135–145)
Total Bilirubin: 0.3 mg/dL (ref 0.3–1.2)
Total Protein: 5.9 g/dL — ABNORMAL LOW (ref 6.5–8.1)

## 2020-10-06 LAB — FERRITIN: Ferritin: 171 ng/mL (ref 11–307)

## 2020-10-06 MED ORDER — SODIUM CHLORIDE 0.9% FLUSH
10.0000 mL | INTRAVENOUS | Status: DC | PRN
  Administered 2020-10-06: 10 mL via INTRAVENOUS
  Filled 2020-10-06: qty 10

## 2020-10-06 MED ORDER — HEPARIN SOD (PORK) LOCK FLUSH 100 UNIT/ML IV SOLN
500.0000 [IU] | Freq: Once | INTRAVENOUS | Status: AC
  Administered 2020-10-06: 500 [IU] via INTRAVENOUS
  Filled 2020-10-06: qty 5

## 2020-10-06 NOTE — Progress Notes (Signed)
Hematology and Oncology Follow Up Visit  [name] [id number] 11-14-1960 60 y.o. 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  She has not yet started the Aromasin.  We last saw her, her inhibin B was down to 12.1.  Because it had dropped, she wanted to hold off and see how things look.  I certainly have no problems with this.    She does have iron deficiency.  When we last saw her her iron studies showed a ferritin of 32 with an iron saturation of 7%.  We did give her some IV iron.  This really helped her.  Her hemoglobin is come up quite nicely.  The MCV also has come up.  She has had no abdominal pain.  She has had no change in bowel bladder habits.  She has had no cough or shortness of breath.  She is still working.  She is going have a nice quiet Easter weekend.  Overall, performance status is ECOG 1.     Medications:  Allergies as of 10/06/2020      Reactions   Augmentin [amoxicillin-pot Clavulanate] Other (See Comments)   Muscle Aches   Prochlorperazine Edisylate Other (See Comments)   Nervous/ flutter/ shakes   Adhesive [tape] Hives   Redness/hives from adhesive tape( tolerates latex gloves); tolerates paper tape      Medication List       Accurate as of October 06, 2020  8:42 AM. If you have any questions, ask your nurse or doctor.        albuterol 108 (90 Base) MCG/ACT inhaler Commonly known as: VENTOLIN HFA TAKE 2 PUFFS BY MOUTH EVERY 6 HOURS AS NEEDED FOR WHEEZE OR SHORTNESS OF BREATH   amoxicillin 500 MG tablet Commonly known as: AMOXIL Take 2,000 mg by mouth See admin instructions. Take 2,000 mg one hour prior to dental  visits.   aspirin EC 81 MG tablet Take 81 mg by mouth daily.   b complex vitamins capsule Take 1 capsule by mouth 2 (two) times daily.   carvedilol 3.125 MG tablet Commonly known as: COREG TAKE 1 TABLET (3.125 MG TOTAL) BY MOUTH 2 (TWO) TIMES DAILY WITH A MEAL.   cholecalciferol 25 MCG (1000 UNIT) tablet Commonly known as: VITAMIN D3 Take 4,000 Units by mouth daily.   exemestane 25 MG tablet Commonly known as: AROMASIN Take 1 tablet (25 mg total) by mouth daily after breakfast.   furosemide 20 MG tablet Commonly known as: LASIX TAKE 1 TABLET BY MOUTH EVERY DAY   lidocaine-prilocaine cream Commonly known as: EMLA Apply 1 application topically as needed. Apply to port per instructions   loperamide 2 MG tablet Commonly known as: IMODIUM A-D Take 4 mg by mouth 3 (three) times daily as needed for diarrhea or loose stools.   LORazepam 0.5 MG tablet Commonly known as: ATIVAN TAKE 1 TABLET BY MOUTH EVERY 6 HRS AS NEEDED. FOR ANXIETY   methocarbamol 500 MG tablet Commonly known as: ROBAXIN TAKE 1 TABLET BY MOUTH EVERY 6 HOURS AS NEEDED FOR MUSCLE SPASMS.   Potassium Chloride ER 20 MEQ Tbcr Take 20 mEq by mouth daily.   spironolactone 25 MG tablet Commonly known as: ALDACTONE  Take 1 tablet (25 mg total) by mouth daily.   traMADol 50 MG tablet Commonly known as: ULTRAM TAKE 1 TABLET BY MOUTH EVERY 6 HOURS AS NEEDED   Vitamin D (Ergocalciferol) 1.25 MG (50000 UNIT) Caps capsule Commonly known as: DRISDOL TAKE 1 CAPSULE (50,000 UNITS TOTAL) BY MOUTH EVERY 7 (SEVEN) DAYS.   warfarin 7.5 MG tablet Commonly known as: COUMADIN Take as directed by the anticoagulation clinic. If you are unsure how to take this medication, talk to your nurse or doctor. Original instructions: TAKE AS DIRECTED BY ANTICOAGULATION CLINIC   zolpidem 10 MG tablet Commonly known as: AMBIEN TAKE 1 TABLET BY MOUTH EVERYDAY AT BEDTIME       Allergies:  Allergies  Allergen Reactions  .  Augmentin [Amoxicillin-Pot Clavulanate] Other (See Comments)    Muscle Aches  . Prochlorperazine Edisylate Other (See Comments)    Nervous/ flutter/ shakes  . Adhesive [Tape] Hives    Redness/hives from adhesive tape( tolerates latex gloves); tolerates paper tape    Past Medical History, Surgical history, Social history, and Family History were reviewed and updated.  Review of Systems:  Review of Systems  Constitutional: Negative.   HENT: Negative.   Eyes: Negative.   Respiratory: Negative.   Cardiovascular: Negative.   Gastrointestinal: Negative.   Genitourinary: Negative.   Musculoskeletal: Negative.   Skin: Negative.   Neurological: Negative.   Endo/Heme/Allergies: Negative.   Psychiatric/Behavioral: Negative.       Exam:  weight is 170 lb (77.1 kg). Her oral temperature is 98.3 F (36.8 C). Her blood pressure is 107/73 and her pulse is 86. Her respiration is 18 and oxygen saturation is 100%.   Wt Readings from Last 3 Encounters:  10/06/20 170 lb (77.1 kg)  08/16/20 178 lb (80.7 kg)  08/14/20 179 lb (81.2 kg)    Physical Exam Vitals reviewed.  HENT:     Head: Normocephalic and atraumatic.  Eyes:     Pupils: Pupils are equal, round, and reactive to light.  Cardiovascular:     Rate and Rhythm: Normal rate and regular rhythm.     Heart sounds: Normal heart sounds.  Pulmonary:     Effort: Pulmonary effort is normal.     Breath sounds: Normal breath sounds.  Abdominal:     General: Bowel sounds are normal.     Palpations: Abdomen is soft.  Musculoskeletal:        General: No tenderness or deformity. Normal range of motion.     Cervical back: Normal range of motion.  Lymphadenopathy:     Cervical: No cervical adenopathy.  Skin:    General: Skin is warm and dry.     Findings: No erythema or rash.  Neurological:     Mental Status: She is alert and oriented to person, place, and time.  Psychiatric:        Behavior: Behavior normal.        Thought Content:  Thought content normal.        Judgment: Judgment normal.      Lab Results  Component Value Date   WBC 6.9 10/06/2020   HGB 13.1 10/06/2020   HCT 39.0 10/06/2020   MCV 87.1 10/06/2020   PLT 140 (L) 10/06/2020   Lab Results  Component Value Date   FERRITIN 32 08/16/2020   IRON 31 (L) 08/16/2020   TIBC 419 08/16/2020   UIBC 387 (H) 08/16/2020   IRONPCTSAT 7 (L) 08/16/2020   Lab Results  Component Value Date   RETICCTPCT 2.2  08/16/2020   RBC 4.48 10/06/2020   No results found for: KPAFRELGTCHN, LAMBDASER, KAPLAMBRATIO No results found for: IGGSERUM, IGA, IGMSERUM No results found for: Odetta Pink, SPEI   Chemistry      Component Value Date/Time   NA 139 09/21/2020 1205   NA 139 08/14/2020 1428   NA 140 03/15/2016 0953   K 3.1 (L) 09/21/2020 1205   K 3.9 03/15/2016 0953   CL 101 09/21/2020 1205   CL 102 12/13/2015 0803   CO2 28 09/21/2020 1205   CO2 19 (L) 03/15/2016 0953   BUN 10 09/21/2020 1205   BUN 16 08/14/2020 1428   BUN 15.9 03/15/2016 0953   CREATININE 1.16 (H) 09/21/2020 1205   CREATININE 1.19 (H) 08/16/2020 0851   CREATININE 1.4 (H) 03/15/2016 0953      Component Value Date/Time   CALCIUM 8.9 09/21/2020 1205   CALCIUM 9.3 03/15/2016 0953   ALKPHOS 52 09/21/2020 1205   ALKPHOS 55 03/15/2016 0953   AST 17 09/21/2020 1205   AST 14 (L) 08/16/2020 0851   AST 17 03/15/2016 0953   ALT 10 09/21/2020 1205   ALT 11 08/16/2020 0851   ALT 23 03/15/2016 0953   BILITOT 0.3 09/21/2020 1205   BILITOT 0.3 08/16/2020 0851   BILITOT 0.38 03/15/2016 0953       Impression and Plan: [name] is a very pleasant 60 yo caucasian female with recurrent granulosa cell tumor.  She has another recurrence of her granulosa cell tumor.  I think this is the third recurrence.  I suspect that we probably will get her on the Aromasin depending on her inhibin B level.  I am glad that the IV iron helped her out.  She feels  better.  She has more energy.  She still has her Port-A-Cath in place.  We will continue to follow her along.  I will plan to see her back after Memorial Day.  If the inhibin B level is higher, then she will start the Aromasin.    Volanda Napoleon, MD 4/15/20228:42 AM

## 2020-10-06 NOTE — Patient Instructions (Signed)
Implanted Port Insertion, Care After This sheet gives you information about how to care for yourself after your procedure. Your health care provider may also give you more specific instructions. If you have problems or questions, contact your health care provider. What can I expect after the procedure? After the procedure, it is common to have:  Discomfort at the port insertion site.  Bruising on the skin over the port. This should improve over 3-4 days. Follow these instructions at home: Port care  After your port is placed, you will get a manufacturer's information card. The card has information about your port. Keep this card with you at all times.  Take care of the port as told by your health care provider. Ask your health care provider if you or a family member can get training for taking care of the port at home. A home health care nurse may also take care of the port.  Make sure to remember what type of port you have. Incision care  Follow instructions from your health care provider about how to take care of your port insertion site. Make sure you: ? Wash your hands with soap and water before and after you change your bandage (dressing). If soap and water are not available, use hand sanitizer. ? Change your dressing as told by your health care provider. ? Leave stitches (sutures), skin glue, or adhesive strips in place. These skin closures may need to stay in place for 2 weeks or longer. If adhesive strip edges start to loosen and curl up, you may trim the loose edges. Do not remove adhesive strips completely unless your health care provider tells you to do that.  Check your port insertion site every day for signs of infection. Check for: ? Redness, swelling, or pain. ? Fluid or blood. ? Warmth. ? Pus or a bad smell.      Activity  Return to your normal activities as told by your health care provider. Ask your health care provider what activities are safe for you.  Do not  lift anything that is heavier than 10 lb (4.5 kg), or the limit that you are told, until your health care provider says that it is safe. General instructions  Take over-the-counter and prescription medicines only as told by your health care provider.  Do not take baths, swim, or use a hot tub until your health care provider approves. Ask your health care provider if you may take showers. You may only be allowed to take sponge baths.  Do not drive for 24 hours if you were given a sedative during your procedure.  Wear a medical alert bracelet in case of an emergency. This will tell any health care providers that you have a port.  Keep all follow-up visits as told by your health care provider. This is important. Contact a health care provider if:  You cannot flush your port with saline as directed, or you cannot draw blood from the port.  You have a fever or chills.  You have redness, swelling, or pain around your port insertion site.  You have fluid or blood coming from your port insertion site.  Your port insertion site feels warm to the touch.  You have pus or a bad smell coming from the port insertion site. Get help right away if:  You have chest pain or shortness of breath.  You have bleeding from your port that you cannot control. Summary  Take care of the port as told by your   health care provider. Keep the manufacturer's information card with you at all times.  Change your dressing as told by your health care provider.  Contact a health care provider if you have a fever or chills or if you have redness, swelling, or pain around your port insertion site.  Keep all follow-up visits as told by your health care provider. This information is not intended to replace advice given to you by your health care provider. Make sure you discuss any questions you have with your health care provider. Document Revised: 01/06/2018 Document Reviewed: 01/06/2018 Elsevier Patient Education   2021 [employer] Inc[employer]  

## 2020-10-09 ENCOUNTER — Telehealth: Payer: Self-pay

## 2020-10-09 NOTE — Telephone Encounter (Signed)
Called and left a vm with appts per 10/06/20 los   [employer]

## 2020-10-10 LAB — INHIBIN B: Inhibin B: 71.1 pg/mL — ABNORMAL HIGH (ref 0.0–16.9)

## 2020-10-11 ENCOUNTER — Ambulatory Visit: Payer: 59 | Admitting: Hematology & Oncology

## 2020-10-11 ENCOUNTER — Other Ambulatory Visit: Payer: 59

## 2020-10-12 ENCOUNTER — Other Ambulatory Visit: Payer: Self-pay | Admitting: Hematology & Oncology

## 2020-10-12 ENCOUNTER — Ambulatory Visit (INDEPENDENT_AMBULATORY_CARE_PROVIDER_SITE_OTHER): Payer: 59 | Admitting: *Deleted

## 2020-10-12 ENCOUNTER — Other Ambulatory Visit: Payer: Self-pay

## 2020-10-12 DIAGNOSIS — Z5181 Encounter for therapeutic drug level monitoring: Secondary | ICD-10-CM

## 2020-10-12 DIAGNOSIS — Z952 Presence of prosthetic heart valve: Secondary | ICD-10-CM

## 2020-10-12 DIAGNOSIS — Z953 Presence of xenogenic heart valve: Secondary | ICD-10-CM

## 2020-10-12 DIAGNOSIS — I480 Paroxysmal atrial fibrillation: Secondary | ICD-10-CM

## 2020-10-12 DIAGNOSIS — D3910 Neoplasm of uncertain behavior of unspecified ovary: Secondary | ICD-10-CM

## 2020-10-12 DIAGNOSIS — F419 Anxiety disorder, unspecified: Secondary | ICD-10-CM

## 2020-10-12 LAB — POCT INR: INR: 2.3 (ref 2.0–3.0)

## 2020-10-12 NOTE — Patient Instructions (Signed)
Description   DOES NOT NEED A PRINTED SHEET.Continue taking 1 tablet (7.5mg ) daily except 1/2 tablet (3.75mg ) on Mondays and Fridays. Recheck INR in 4 weeks. Continue normal dark green consistency. [facility] [phone] Main [phone]

## 2020-10-16 ENCOUNTER — Other Ambulatory Visit: Payer: Self-pay | Admitting: *Deleted

## 2020-10-16 DIAGNOSIS — D3910 Neoplasm of uncertain behavior of unspecified ovary: Secondary | ICD-10-CM

## 2020-10-16 MED ORDER — TRAMADOL HCL 50 MG PO TABS: 50.0000 mg | ORAL_TABLET | Freq: Four times a day (QID) | ORAL | 1 refills | Status: DC | PRN

## 2020-10-18 NOTE — Progress Notes (Signed)
Remote ICD transmission.   

## 2020-10-19 ENCOUNTER — Other Ambulatory Visit: Payer: Self-pay | Admitting: Hematology & Oncology

## 2020-11-08 ENCOUNTER — Other Ambulatory Visit: Payer: Self-pay | Admitting: Hematology & Oncology

## 2020-11-08 DIAGNOSIS — F419 Anxiety disorder, unspecified: Secondary | ICD-10-CM

## 2020-11-09 ENCOUNTER — Ambulatory Visit (INDEPENDENT_AMBULATORY_CARE_PROVIDER_SITE_OTHER): Payer: 59

## 2020-11-09 ENCOUNTER — Other Ambulatory Visit: Payer: Self-pay | Admitting: *Deleted

## 2020-11-09 ENCOUNTER — Other Ambulatory Visit: Payer: Self-pay

## 2020-11-09 DIAGNOSIS — I480 Paroxysmal atrial fibrillation: Secondary | ICD-10-CM

## 2020-11-09 DIAGNOSIS — Z952 Presence of prosthetic heart valve: Secondary | ICD-10-CM

## 2020-11-09 DIAGNOSIS — Z953 Presence of xenogenic heart valve: Secondary | ICD-10-CM | POA: Diagnosis not present

## 2020-11-09 LAB — POCT INR: INR: 2.4 (ref 2.0–3.0)

## 2020-11-09 NOTE — Patient Instructions (Signed)
Description   DOES NOT NEED A PRINTED SHEET. Continue taking 1 tablet (7.5mg) daily except 1/2 tablet (3.75mg) on Mondays and Fridays. Recheck INR in 6 weeks. Continue normal dark green consistency. [facility] [phone] Main [phone]      

## 2020-11-10 ENCOUNTER — Other Ambulatory Visit: Payer: Self-pay | Admitting: Hematology & Oncology

## 2020-11-10 DIAGNOSIS — D3910 Neoplasm of uncertain behavior of unspecified ovary: Secondary | ICD-10-CM

## 2020-11-16 ENCOUNTER — Other Ambulatory Visit: Payer: Self-pay | Admitting: Hematology & Oncology

## 2020-11-16 DIAGNOSIS — D3910 Neoplasm of uncertain behavior of unspecified ovary: Secondary | ICD-10-CM

## 2020-11-18 ENCOUNTER — Other Ambulatory Visit: Payer: Self-pay | Admitting: Physician Assistant

## 2020-11-18 DIAGNOSIS — I5022 Chronic systolic (congestive) heart failure: Secondary | ICD-10-CM

## 2020-11-22 ENCOUNTER — Other Ambulatory Visit: Payer: Self-pay | Admitting: Hematology & Oncology

## 2020-11-27 ENCOUNTER — Inpatient Hospital Stay: Payer: 59

## 2020-11-27 ENCOUNTER — Inpatient Hospital Stay: Payer: 59 | Attending: Hematology & Oncology

## 2020-11-27 ENCOUNTER — Encounter: Payer: Self-pay | Admitting: Hematology & Oncology

## 2020-11-27 ENCOUNTER — Other Ambulatory Visit: Payer: Self-pay | Admitting: Family

## 2020-11-27 ENCOUNTER — Other Ambulatory Visit: Payer: Self-pay

## 2020-11-27 ENCOUNTER — Inpatient Hospital Stay (HOSPITAL_BASED_OUTPATIENT_CLINIC_OR_DEPARTMENT_OTHER): Payer: 59 | Admitting: Hematology & Oncology

## 2020-11-27 VITALS — BP 111/66 | HR 79 | Temp 97.9°F | Resp 18 | Ht 66.0 in | Wt 174.0 lb

## 2020-11-27 DIAGNOSIS — Z452 Encounter for adjustment and management of vascular access device: Secondary | ICD-10-CM | POA: Diagnosis not present

## 2020-11-27 DIAGNOSIS — D3910 Neoplasm of uncertain behavior of unspecified ovary: Secondary | ICD-10-CM | POA: Diagnosis not present

## 2020-11-27 DIAGNOSIS — C569 Malignant neoplasm of unspecified ovary: Secondary | ICD-10-CM | POA: Diagnosis present

## 2020-11-27 DIAGNOSIS — D6481 Anemia due to antineoplastic chemotherapy: Secondary | ICD-10-CM | POA: Diagnosis not present

## 2020-11-27 DIAGNOSIS — T451X5D Adverse effect of antineoplastic and immunosuppressive drugs, subsequent encounter: Secondary | ICD-10-CM | POA: Insufficient documentation

## 2020-11-27 DIAGNOSIS — F419 Anxiety disorder, unspecified: Secondary | ICD-10-CM

## 2020-11-27 DIAGNOSIS — Z95828 Presence of other vascular implants and grafts: Secondary | ICD-10-CM

## 2020-11-27 DIAGNOSIS — D500 Iron deficiency anemia secondary to blood loss (chronic): Secondary | ICD-10-CM

## 2020-11-27 LAB — CMP (CANCER CENTER ONLY)
ALT: 10 U/L (ref 0–44)
AST: 14 U/L — ABNORMAL LOW (ref 15–41)
Albumin: 3.9 g/dL (ref 3.5–5.0)
Alkaline Phosphatase: 41 U/L (ref 38–126)
Anion gap: 8 (ref 5–15)
BUN: 12 mg/dL (ref 8–23)
CO2: 26 mmol/L (ref 22–32)
Calcium: 9.2 mg/dL (ref 8.9–10.3)
Chloride: 99 mmol/L (ref 98–111)
Creatinine: 1.04 mg/dL — ABNORMAL HIGH (ref 0.44–1.00)
GFR, Estimated: >60 mL/min (ref >60)
Glucose, Bld: 85 mg/dL (ref 70–99)
Potassium: 3.8 mmol/L (ref 3.5–5.1)
Sodium: 133 mmol/L — ABNORMAL LOW (ref 135–145)
Total Bilirubin: 0.3 mg/dL (ref 0.3–1.2)
Total Protein: 5.9 g/dL — ABNORMAL LOW (ref 6.5–8.1)

## 2020-11-27 LAB — CBC WITH DIFFERENTIAL (CANCER CENTER ONLY)
Abs Immature Granulocytes: 0.03 10*3/uL (ref 0.00–0.07)
Basophils Absolute: 0 10*3/uL (ref 0.0–0.1)
Basophils Relative: 0 %
Eosinophils Absolute: 0.2 10*3/uL (ref 0.0–0.5)
Eosinophils Relative: 2 %
HCT: 36.8 % (ref 36.0–46.0)
Hemoglobin: 12.6 g/dL (ref 12.0–15.0)
Immature Granulocytes: 0 %
Lymphocytes Relative: 11 %
Lymphs Abs: 0.9 10*3/uL (ref 0.7–4.0)
MCH: 31.1 pg (ref 26.0–34.0)
MCHC: 34.2 g/dL (ref 30.0–36.0)
MCV: 90.9 fL (ref 80.0–100.0)
Monocytes Absolute: 0.7 10*3/uL (ref 0.1–1.0)
Monocytes Relative: 9 %
Neutro Abs: 6.2 10*3/uL (ref 1.7–7.7)
Neutrophils Relative %: 78 %
Platelet Count: 147 10*3/uL — ABNORMAL LOW (ref 150–400)
RBC: 4.05 MIL/uL (ref 3.87–5.11)
RDW: 15.9 % — ABNORMAL HIGH (ref 11.5–15.5)
WBC Count: 8 10*3/uL (ref 4.0–10.5)
nRBC: 0 % (ref 0.0–0.2)

## 2020-11-27 LAB — LACTATE DEHYDROGENASE: LDH: 146 U/L (ref 98–192)

## 2020-11-27 MED ORDER — SODIUM CHLORIDE 0.9% FLUSH
10.0000 mL | Freq: Once | INTRAVENOUS | Status: AC
  Administered 2020-11-27: 10 mL via INTRAVENOUS
  Filled 2020-11-27: qty 10

## 2020-11-27 MED ORDER — HEPARIN SOD (PORK) LOCK FLUSH 100 UNIT/ML IV SOLN
500.0000 [IU] | Freq: Once | INTRAVENOUS | Status: AC
Start: 2020-11-27 — End: 2020-11-27
  Administered 2020-11-27: 500 [IU] via INTRAVENOUS
  Filled 2020-11-27: qty 5

## 2020-11-27 NOTE — Progress Notes (Signed)
Hematology and Oncology Follow Up Visit  [name] [id number] 1960-12-16 60 y.o. 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  Unfortunately, her inhibin B was up to 71.  I think this is a good indicator that the Aromasin is really not doing the job for [place].  They were going to have to think about doing scans on her so we can see how everything looks.  If we do find that she has truly progressive disease, I do not know if we will have to do a biopsy and then send off the material for molecular studies.  She has some fullness in her abdomen.  She is still going to the bathroom without any difficulty.  She is still working.  She has a grandson due in July.  This is a huge event for her.  There is no bleeding.  She has not had no leg swelling.  She has had no cough or shortness of breath.  She has had some pain issues.  She is on Ultram for this.  Currently, I would have to say that her performance status is ECOG 1.    Medications:  Allergies as of 11/27/2020      Reactions   Augmentin [amoxicillin-pot Clavulanate] Other (See Comments)   Muscle Aches   Prochlorperazine Edisylate Other (See Comments)   Nervous/ flutter/ shakes   Adhesive [tape] Hives   Redness/hives from adhesive tape( tolerates latex gloves); tolerates paper tape      Medication List       Accurate as of November 27, 2020  5:07 PM. If you have any questions, ask your nurse or doctor.        albuterol 108 (90 Base) MCG/ACT inhaler Commonly known as: VENTOLIN HFA TAKE 2 PUFFS BY MOUTH EVERY 6 HOURS AS NEEDED FOR WHEEZE OR SHORTNESS OF BREATH   amoxicillin 500 MG tablet Commonly known  as: AMOXIL Take 2,000 mg by mouth See admin instructions. Take 2,000 mg one hour prior to dental visits.   aspirin EC 81 MG tablet Take 81 mg by mouth daily.   b complex vitamins capsule Take 1 capsule by mouth 2 (two) times daily.   carvedilol 3.125 MG tablet Commonly known as: COREG TAKE 1 TABLET (3.125 MG TOTAL) BY MOUTH 2 (TWO) TIMES DAILY WITH A MEAL.   cholecalciferol 25 MCG (1000 UNIT) tablet Commonly known as: VITAMIN D3 Take 4,000 Units by mouth daily.   exemestane 25 MG tablet Commonly known as: AROMASIN Take 1 tablet (25 mg total) by mouth daily after breakfast.   furosemide 20 MG tablet Commonly known as: LASIX TAKE 1 TABLET BY MOUTH EVERY DAY   lidocaine-prilocaine cream Commonly known as: EMLA Apply 1 application topically as needed. Apply to port per instructions   loperamide 2 MG tablet Commonly known as: IMODIUM A-D Take 4 mg by mouth 3 (three) times daily as needed for diarrhea or loose stools.   LORazepam 0.5 MG tablet Commonly known as: ATIVAN TAKE 1 TABLET BY MOUTH EVERY 6 HRS AS NEEDED. FOR ANXIETY   methocarbamol 500 MG tablet Commonly known as: ROBAXIN TAKE 1 TABLET BY MOUTH EVERY 6 HOURS AS NEEDED FOR MUSCLE SPASMS.  Potassium Chloride ER 20 MEQ Tbcr Take 20 mEq by mouth daily.   spironolactone 25 MG tablet Commonly known as: ALDACTONE Take 1 tablet (25 mg total) by mouth daily.   traMADol 50 MG tablet Commonly known as: ULTRAM TAKE 1 TABLET BY MOUTH EVERY 6 HOURS AS NEEDED.   Vitamin D (Ergocalciferol) 1.25 MG (50000 UNIT) Caps capsule Commonly known as: DRISDOL TAKE 1 CAPSULE (50,000 UNITS TOTAL) BY MOUTH EVERY 7 (SEVEN) DAYS.   warfarin 7.5 MG tablet Commonly known as: COUMADIN Take as directed by the anticoagulation clinic. If you are unsure how to take this medication, talk to your nurse or doctor. Original instructions: TAKE AS DIRECTED BY ANTICOAGULATION CLINIC   zolpidem 10 MG tablet Commonly known as: AMBIEN TAKE 1  TABLET BY MOUTH EVERYDAY AT BEDTIME       Allergies:  Allergies  Allergen Reactions  . Augmentin [Amoxicillin-Pot Clavulanate] Other (See Comments)    Muscle Aches  . Prochlorperazine Edisylate Other (See Comments)    Nervous/ flutter/ shakes  . Adhesive [Tape] Hives    Redness/hives from adhesive tape( tolerates latex gloves); tolerates paper tape    Past Medical History, Surgical history, Social history, and Family History were reviewed and updated.  Review of Systems:  Review of Systems  Constitutional: Negative.   HENT: Negative.   Eyes: Negative.   Respiratory: Negative.   Cardiovascular: Negative.   Gastrointestinal: Negative.   Genitourinary: Negative.   Musculoskeletal: Negative.   Skin: Negative.   Neurological: Negative.   Endo/Heme/Allergies: Negative.   Psychiatric/Behavioral: Negative.       Exam:  height is 5\' 6"  (1.676 m) and weight is 78.9 kg. Her oral temperature is 97.9 F (36.6 C). Her blood pressure is 111/66 and her pulse is 79. Her respiration is 18 and oxygen saturation is 100%.   Wt Readings from Last 3 Encounters:  11/27/20 78.9 kg  10/06/20 77.1 kg  08/16/20 80.7 kg    Physical Exam Vitals reviewed.  HENT:     Head: Normocephalic and atraumatic.  Eyes:     Pupils: Pupils are equal, round, and reactive to light.  Cardiovascular:     Rate and Rhythm: Normal rate and regular rhythm.     Heart sounds: Normal heart sounds.  Pulmonary:     Effort: Pulmonary effort is normal.     Breath sounds: Normal breath sounds.  Abdominal:     General: Bowel sounds are normal.     Palpations: Abdomen is soft.  Musculoskeletal:        General: No tenderness or deformity. Normal range of motion.     Cervical back: Normal range of motion.  Lymphadenopathy:     Cervical: No cervical adenopathy.  Skin:    General: Skin is warm and dry.     Findings: No erythema or rash.  Neurological:     Mental Status: She is alert and oriented to person,  place, and time.  Psychiatric:        Behavior: Behavior normal.        Thought Content: Thought content normal.        Judgment: Judgment normal.      Lab Results  Component Value Date   WBC 8.0 11/27/2020   HGB 12.6 11/27/2020   HCT 36.8 11/27/2020   MCV 90.9 11/27/2020   PLT 147 (L) 11/27/2020   Lab Results  Component Value Date   FERRITIN 171 10/06/2020   IRON 81 10/06/2020   TIBC 243 10/06/2020   UIBC 161  10/06/2020   IRONPCTSAT 33 10/06/2020   Lab Results  Component Value Date   RETICCTPCT 2.2 08/16/2020   RBC 4.05 11/27/2020   No results found for: KPAFRELGTCHN, LAMBDASER, KAPLAMBRATIO No results found for: IGGSERUM, IGA, IGMSERUM No results found for: Kathrynn Ducking, MSPIKE, SPEI   Chemistry      Component Value Date/Time   NA 133 (L) 11/27/2020 1410   NA 139 08/14/2020 1428   NA 140 03/15/2016 0953   K 3.8 11/27/2020 1410   K 3.9 03/15/2016 0953   CL 99 11/27/2020 1410   CL 102 12/13/2015 0803   CO2 26 11/27/2020 1410   CO2 19 (L) 03/15/2016 0953   BUN 12 11/27/2020 1410   BUN 16 08/14/2020 1428   BUN 15.9 03/15/2016 0953   CREATININE 1.04 (H) 11/27/2020 1410   CREATININE 1.4 (H) 03/15/2016 0953      Component Value Date/Time   CALCIUM 9.2 11/27/2020 1410   CALCIUM 9.3 03/15/2016 0953   ALKPHOS 41 11/27/2020 1410   ALKPHOS 55 03/15/2016 0953   AST 14 (L) 11/27/2020 1410   AST 17 03/15/2016 0953   ALT 10 11/27/2020 1410   ALT 23 03/15/2016 0953   BILITOT 0.3 11/27/2020 1410   BILITOT 0.38 03/15/2016 0953       Impression and Plan: Ms. Petti is a very pleasant 60 yo caucasian female with recurrent granulosa cell tumor.  I suspect that the granulosa cell tumor is worsening.  Again she is on Aromasin.  I told her the stop the Aromasin.  I do still think this is really helping [place] right now.  We will see about getting a CT scan.  We will see about getting this this week or next week.  Depending on what  we find, I will see about a biopsy.  I will have to talk to the gynecologic oncologist and see what they may have to offer if anything.  I would not think that she would be a candidate for surgical resection.  However, she still is in good shape.  May be an aggressive surgical approach could be undertaken.  She did have a good response to systemic chemotherapy.  She has not had this now for 3 years.  We could certainly consider chemotherapy again.  We will plan to get her back once we see what the CT scan shows and whether or not we need a biopsy.  I suspect that we probably will need a biopsy.     Volanda Napoleon, MD 6/6/20225:07 PM

## 2020-11-27 NOTE — Patient Instructions (Signed)
Tunneled Central Venous Catheter Flushing Guide  It is important to flush your tunneled central venous catheter each time you use it, both before and after you use it. Flushing your catheter will help prevent it from clogging. What are the risks? Risks may include:  Infection.  Air getting into the catheter and bloodstream. Supplies needed:  A clean pair of gloves.  A disinfecting wipe. Use an alcohol wipe, chlorhexidine wipe, or iodine wipe as told by your health care provider.  A 10 mL syringe that has been prefilled with saline solution.  An empty 10 mL syringe, if a substance called heparin was injected into your catheter. How to flush your catheter When you flush your catheter, make sure you follow any specific instructions from your health care provider or the manufacturer. These are general guidelines. Flushing your catheter before use If there is heparin in your catheter: 1. Wash your hands with soap and water. 2. Put on gloves. 3. Scrub the injection cap for a minimum of 15 seconds with a disinfecting wipe. 4. Unclamp the catheter. 5. Attach the empty syringe to the injection cap. 6. Pull the syringe plunger back and withdraw 10 mL of blood. 7. Place the syringe into an appropriate waste container. 8. Scrub the injection cap for 15 seconds with a disinfecting wipe. 9. Attach the prefilled syringe to the injection cap. 10. Flush the catheter by pushing the plunger forward until all the liquid from the syringe is in the catheter. 11. Remove the syringe from the injection cap. 12. Clamp the catheter. If there is no heparin in your catheter: 1. Wash your hands with soap and water. 2. Put on gloves. 3. Scrub the injection cap for 15 seconds with a disinfecting wipe. 4. Unclamp the catheter. 5. Attach the prefilled syringe to the injection cap. 6. Flush the catheter by pushing the plunger forward until 5 mL of the liquid from the syringe is in the catheter. 7. Pull back on  the syringe until you see blood in the catheter. 8. If you have been asked to collect any blood, follow your health care provider's instructions. Otherwise, flush the catheter with the rest of the solution from the syringe. 9. Remove the syringe from the injection cap. 10. Clamp the catheter.   Flushing your catheter after use 1. Wash your hands with soap and water. 2. Put on gloves. 3. Scrub the injection cap for 15 seconds with a disinfecting wipe. 4. Unclamp the catheter. 5. Attach the prefilled syringe to the injection cap. 6. Flush the catheter by pushing the plunger forward until all of the liquid from the syringe is in the catheter. 7. Remove the syringe from the injection cap. 8. Clamp the catheter. Problems and solutions  If blood cannot be completely cleared from the injection cap, you may need to have the injection cap replaced.  If the catheter is difficult to flush, use the pulsing method. The pulsing method involves pushing only a few milliliters of solution into the catheter at a time and pausing between pushes.  If you do not see blood in the catheter when you pull back on the syringe, change your body position, such as by raising your arms above your head. Take a deep breath and cough. Then, pull back on the syringe. If you still do not see blood, flush the catheter with a small amount of solution. Then, change positions again and take a breath or cough. Pull back on the syringe again. If you still do not   see blood, finish flushing the catheter and contact your health care provider. Do not use your catheter until your health care provider says it is okay. General tips  Have someone help you flush your catheter, if possible.  Do not force fluid through your catheter.  Do not use a syringe that is larger or smaller than 10 mL. Using a smaller syringe can make the catheter burst.  Do not use your catheter without flushing it first if it has heparin in it. Contact a health  care provider if:  You cannot see any blood in the catheter when you flush it before using it.  Your catheter is difficult to flush. Get help right away if:  You cannot flush the catheter.  The catheter leaks when you flush it or when there is fluid in it.  There are cracks or breaks in the catheter. Summary  It is important to flush your tunneled central venous catheter each time you use it, both before and after you use it.  Scrub the injection cap for 15 seconds with a disinfecting wipe before and after you flush it.  When you flush your catheter, make sure you follow any specific instructions from your health care provider or the manufacturer.  Get help right away if you cannot flush the catheter. This information is not intended to replace advice given to you by your health care provider. Make sure you discuss any questions you have with your health care provider. Document Revised: 08/19/2019 Document Reviewed: 08/26/2018 Elsevier Patient Education  2021 Elsevier Inc.  

## 2020-11-28 ENCOUNTER — Telehealth: Payer: Self-pay

## 2020-11-28 ENCOUNTER — Encounter: Payer: Self-pay | Admitting: Hematology & Oncology

## 2020-11-28 LAB — IRON AND TIBC
Iron: 55 ug/dL (ref 41–142)
Saturation Ratios: 20 % — ABNORMAL LOW (ref 21–57)
TIBC: 276 ug/dL (ref 236–444)
UIBC: 222 ug/dL (ref 120–384)

## 2020-11-28 LAB — FERRITIN: Ferritin: 110 ng/mL (ref 11–307)

## 2020-11-28 NOTE — Telephone Encounter (Signed)
No 11/27/20 los noted but pt was called for iron tx appt per result note   Shailee Foots

## 2020-11-29 ENCOUNTER — Other Ambulatory Visit: Payer: Self-pay | Admitting: Hematology & Oncology

## 2020-11-29 LAB — INHIBIN B: Inhibin B: 29.4 pg/mL — ABNORMAL HIGH (ref 0.0–16.9)

## 2020-11-30 ENCOUNTER — Encounter: Payer: Self-pay | Admitting: *Deleted

## 2020-12-05 ENCOUNTER — Telehealth: Payer: Self-pay

## 2020-12-06 ENCOUNTER — Ambulatory Visit: Payer: 59

## 2020-12-07 ENCOUNTER — Inpatient Hospital Stay: Payer: 59

## 2020-12-07 ENCOUNTER — Other Ambulatory Visit: Payer: Self-pay

## 2020-12-07 VITALS — BP 99/57 | HR 78 | Temp 98.2°F | Resp 17

## 2020-12-07 DIAGNOSIS — D6481 Anemia due to antineoplastic chemotherapy: Secondary | ICD-10-CM

## 2020-12-07 DIAGNOSIS — T451X5A Adverse effect of antineoplastic and immunosuppressive drugs, initial encounter: Secondary | ICD-10-CM

## 2020-12-07 MED ORDER — HEPARIN SOD (PORK) LOCK FLUSH 100 UNIT/ML IV SOLN
500.0000 [IU] | Freq: Once | INTRAVENOUS | Status: AC
  Administered 2020-12-07: 500 [IU] via INTRAVENOUS
  Filled 2020-12-07: qty 5

## 2020-12-07 MED ORDER — SODIUM CHLORIDE 0.9 % IV SOLN
125.0000 mg | Freq: Once | INTRAVENOUS | Status: AC
  Administered 2020-12-07: 125 mg via INTRAVENOUS
  Filled 2020-12-07: qty 10

## 2020-12-07 MED ORDER — SODIUM CHLORIDE 0.9% FLUSH
10.0000 mL | Freq: Once | INTRAVENOUS | Status: AC
  Administered 2020-12-07: 10 mL via INTRAVENOUS
  Filled 2020-12-07: qty 10

## 2020-12-07 MED ORDER — SODIUM CHLORIDE 0.9 % IV SOLN
INTRAVENOUS | Status: DC
  Filled 2020-12-07: qty 250

## 2020-12-07 NOTE — Patient Instructions (Signed)
Sodium Ferric Gluconate Complex injection What is this medication? SODIUM FERRIC GLUCONATE COMPLEX (SOE dee um FER ik GLOO koe nate KOM pleks) is an iron replacement. It is used with epoetin therapy to treat low iron levelsin patients who are receiving hemodialysis. This medicine may be used for other purposes; ask your health care provider orpharmacist if you have questions. COMMON BRAND NAME(S): Ferrlecit, Nulecit What should I tell my care team before I take this medication? They need to know if you have any of the following conditions: anemia that is not from iron deficiency high levels of iron in the body an unusual or allergic reaction to iron, benzyl alcohol, other medicines, foods, dyes, or preservatives pregnant or are trying to become pregnant breast-feeding How should I use this medication? This medicine is for infusion into a vein. It is given by a health careprofessional in a hospital or clinic setting. Talk to your pediatrician regarding the use of this medicine in children. While this drug may be prescribed for children as young as 6 years old for selectedconditions, precautions do apply. Overdosage: If you think you have taken too much of this medicine contact apoison control center or emergency room at once. NOTE: This medicine is only for you. Do not share this medicine with others. What if I miss a dose? It is important not to miss your dose. Call your doctor or health careprofessional if you are unable to keep an appointment. What may interact with this medication? Do not take this medicine with any of the following medications: deferoxamine dimercaprol other iron products This medicine may also interact with the following medications: chloramphenicol deferasirox medicine for blood pressure like enalapril This list may not describe all possible interactions. Give your health care provider a list of all the medicines, herbs, non-prescription drugs, or dietary  supplements you use. Also tell them if you smoke, drink alcohol, or use illegaldrugs. Some items may interact with your medicine. What should I watch for while using this medication? Your condition will be monitored carefully while you are receiving thismedicine. Visit your doctor for check-ups as directed. What side effects may I notice from receiving this medication? Side effects that you should report to your doctor or health care professionalas soon as possible: allergic reactions like skin rash, itching or hives, swelling of the face, lips, or tongue breathing problems changes in hearing changes in vision chills, flushing, or sweating fast, irregular heartbeat feeling faint or lightheaded, falls fever, flu-like symptoms high or low blood pressure pain, tingling, numbness in the hands or feet severe pain in the chest, back, flanks, or groin swelling of the ankles, feet, hands trouble passing urine or change in the amount of urine unusually weak or tired Side effects that usually do not require medical attention (report to yourdoctor or health care professional if they continue or are bothersome): cramps dark colored stools diarrhea headache nausea, vomiting stomach upset This list may not describe all possible side effects. Call your doctor for medical advice about side effects. You may report side effects to FDA at1-800-FDA-1088. Where should I keep my medication? This drug is given in a hospital or clinic and will not be stored at home. NOTE: This sheet is a summary. It may not cover all possible information. If you have questions about this medicine, talk to your doctor, pharmacist, orhealth care provider.  2022 Elsevier/Gold Standard (2008-02-10 15:58:57)  

## 2020-12-08 ENCOUNTER — Ambulatory Visit (HOSPITAL_BASED_OUTPATIENT_CLINIC_OR_DEPARTMENT_OTHER): Payer: 59

## 2020-12-12 ENCOUNTER — Other Ambulatory Visit: Payer: Self-pay | Admitting: Family

## 2020-12-12 DIAGNOSIS — D3910 Neoplasm of uncertain behavior of unspecified ovary: Secondary | ICD-10-CM

## 2020-12-15 ENCOUNTER — Other Ambulatory Visit: Payer: Self-pay

## 2020-12-15 ENCOUNTER — Ambulatory Visit (HOSPITAL_BASED_OUTPATIENT_CLINIC_OR_DEPARTMENT_OTHER)
Admission: RE | Admit: 2020-12-15 | Discharge: 2020-12-15 | Disposition: A | Discharge status: Home / Self Care | Payer: 59 | Source: Ambulatory Visit | Attending: Hematology & Oncology | Admitting: Hematology & Oncology

## 2020-12-15 ENCOUNTER — Inpatient Hospital Stay: Payer: 59

## 2020-12-15 ENCOUNTER — Encounter (HOSPITAL_BASED_OUTPATIENT_CLINIC_OR_DEPARTMENT_OTHER): Payer: Self-pay

## 2020-12-15 DIAGNOSIS — D6481 Anemia due to antineoplastic chemotherapy: Secondary | ICD-10-CM | POA: Diagnosis not present

## 2020-12-15 DIAGNOSIS — Z95828 Presence of other vascular implants and grafts: Secondary | ICD-10-CM

## 2020-12-15 DIAGNOSIS — D3910 Neoplasm of uncertain behavior of unspecified ovary: Secondary | ICD-10-CM | POA: Diagnosis not present

## 2020-12-15 MED ORDER — SODIUM CHLORIDE 0.9% FLUSH
10.0000 mL | Freq: Once | INTRAVENOUS | Status: AC
  Administered 2020-12-15: 10 mL via INTRAVENOUS
  Filled 2020-12-15: qty 10

## 2020-12-15 MED ORDER — HEPARIN SOD (PORK) LOCK FLUSH 100 UNIT/ML IV SOLN
500.0000 [IU] | Freq: Once | INTRAVENOUS | Status: AC
  Administered 2020-12-15: 500 [IU] via INTRAVENOUS
  Filled 2020-12-15: qty 5

## 2020-12-15 MED ORDER — IOHEXOL 300 MG/ML  SOLN
100.0000 mL | Freq: Once | INTRAMUSCULAR | Status: AC | PRN
  Administered 2020-12-15: 100 mL via INTRAVENOUS

## 2020-12-15 NOTE — Patient Instructions (Signed)
Tunneled Central Venous Catheter Flushing Guide It is important to flush your tunneled central venous catheter each time you use it, both before and after you use it. Flushing your catheter will help prevent it from clogging. What are the risks? Risks may include: Infection. Air getting into the catheter and bloodstream. Supplies needed: A clean pair of gloves. A disinfecting wipe. Use an alcohol wipe, chlorhexidine wipe, or iodine wipe as told by your health care provider. A 10 mL syringe that has been prefilled with saline solution. An empty 10 mL syringe, if a substance called heparin was injected into your catheter. How to flush your catheter When you flush your catheter, make sure you follow any specific instructions from your health care provider or the manufacturer. These are general guidelines. Flushing your catheter before use If there is heparin in your catheter: Wash your hands with soap and water. Put on gloves. Scrub the injection cap for a minimum of 15 seconds with a disinfecting wipe. Unclamp the catheter. Attach the empty syringe to the injection cap. Pull the syringe plunger back and withdraw 10 mL of blood. Place the syringe into an appropriate waste container. Scrub the injection cap for 15 seconds with a disinfecting wipe. Attach the prefilled syringe to the injection cap. Flush the catheter by pushing the plunger forward until all the liquid from the syringe is in the catheter. Remove the syringe from the injection cap. Clamp the catheter. If there is no heparin in your catheter: Wash your hands with soap and water. Put on gloves. Scrub the injection cap for 15 seconds with a disinfecting wipe. Unclamp the catheter. Attach the prefilled syringe to the injection cap. Flush the catheter by pushing the plunger forward until 5 mL of the liquid from the syringe is in the catheter. Pull back on the syringe until you see blood in the catheter. If you have been asked  to collect any blood, follow your health care provider's instructions. Otherwise, flush the catheter with the rest of the solution from the syringe. Remove the syringe from the injection cap. Clamp the catheter.  Flushing your catheter after use Wash your hands with soap and water. Put on gloves. Scrub the injection cap for 15 seconds with a disinfecting wipe. Unclamp the catheter. Attach the prefilled syringe to the injection cap. Flush the catheter by pushing the plunger forward until all of the liquid from the syringe is in the catheter. Remove the syringe from the injection cap. Clamp the catheter. Problems and solutions If blood cannot be completely cleared from the injection cap, you may need to have the injection cap replaced. If the catheter is difficult to flush, use the pulsing method. The pulsing method involves pushing only a few milliliters of solution into the catheter at a time and pausing between pushes. If you do not see blood in the catheter when you pull back on the syringe, change your body position, such as by raising your arms above your head. Take a deep breath and cough. Then, pull back on the syringe. If you still do not see blood, flush the catheter with a small amount of solution. Then, change positions again and take a breath or cough. Pull back on the syringe again. If you still do not see blood, finish flushing the catheter and contact your health care provider. Do not use your catheter until your health care provider says it is okay. General tips Have someone help you flush your catheter, if possible. Do not force fluid   through your catheter. Do not use a syringe that is larger or smaller than 10 mL. Using a smaller syringe can make the catheter burst. Do not use your catheter without flushing it first if it has heparin in it. Contact a health care provider if: You cannot see any blood in the catheter when you flush it before using it. Your catheter is difficult  to flush. Get help right away if: You cannot flush the catheter. The catheter leaks when you flush it or when there is fluid in it. There are cracks or breaks in the catheter. Summary It is important to flush your tunneled central venous catheter each time you use it, both before and after you use it. Scrub the injection cap for 15 seconds with a disinfecting wipe before and after you flush it. When you flush your catheter, make sure you follow any specific instructions from your health care provider or the manufacturer. Get help right away if you cannot flush the catheter. This information is not intended to replace advice given to you by your health care provider. Make sure you discuss any questions you have with your health care provider. Document Revised: 08/19/2019 Document Reviewed: 08/26/2018 Elsevier Patient Education  2022 Elsevier Inc.  

## 2020-12-19 ENCOUNTER — Encounter: Payer: Self-pay | Admitting: *Deleted

## 2020-12-20 ENCOUNTER — Other Ambulatory Visit: Payer: Self-pay | Admitting: Hematology & Oncology

## 2020-12-20 ENCOUNTER — Other Ambulatory Visit: Payer: Self-pay | Admitting: Family

## 2020-12-20 DIAGNOSIS — D3910 Neoplasm of uncertain behavior of unspecified ovary: Secondary | ICD-10-CM

## 2020-12-20 DIAGNOSIS — F419 Anxiety disorder, unspecified: Secondary | ICD-10-CM

## 2020-12-21 ENCOUNTER — Ambulatory Visit (INDEPENDENT_AMBULATORY_CARE_PROVIDER_SITE_OTHER): Payer: 59 | Admitting: *Deleted

## 2020-12-21 ENCOUNTER — Other Ambulatory Visit: Payer: Self-pay

## 2020-12-21 DIAGNOSIS — I480 Paroxysmal atrial fibrillation: Secondary | ICD-10-CM | POA: Diagnosis not present

## 2020-12-21 DIAGNOSIS — Z953 Presence of xenogenic heart valve: Secondary | ICD-10-CM | POA: Diagnosis not present

## 2020-12-21 DIAGNOSIS — Z5181 Encounter for therapeutic drug level monitoring: Secondary | ICD-10-CM

## 2020-12-21 DIAGNOSIS — Z952 Presence of prosthetic heart valve: Secondary | ICD-10-CM

## 2020-12-21 LAB — POCT INR: INR: 2.4 (ref 2.0–3.0)

## 2020-12-21 NOTE — Patient Instructions (Signed)
Description   DOES NOT NEED A PRINTED SHEET.Continue taking 1 tablet (7.5mg ) daily except 1/2 tablet (3.75mg ) on Mondays and Fridays. Recheck INR in 6 weeks. Continue normal dark green consistency. [facility] [phone] Main [phone]

## 2021-01-02 ENCOUNTER — Ambulatory Visit (INDEPENDENT_AMBULATORY_CARE_PROVIDER_SITE_OTHER): Payer: 59

## 2021-01-02 DIAGNOSIS — I4901 Ventricular fibrillation: Secondary | ICD-10-CM | POA: Diagnosis not present

## 2021-01-02 LAB — CUP PACEART REMOTE DEVICE CHECK
Battery Remaining Longevity: 94 mo
Battery Remaining Percentage: 90 %
Battery Voltage: 3.01 V
Brady Statistic AP VP Percent: 1 %
Brady Statistic AP VS Percent: 1 %
Brady Statistic AS VP Percent: 1 %
Brady Statistic AS VS Percent: 99 %
Brady Statistic RA Percent Paced: 1 %
Brady Statistic RV Percent Paced: 1 %
Date Time Interrogation Session: 20220712021403
HighPow Impedance: 62 Ohm
Implantable Lead Implant Date: 20110503
Implantable Lead Implant Date: 20130815
Implantable Lead Location: 753859
Implantable Lead Location: 753860
Implantable Pulse Generator Implant Date: 20211012
Lead Channel Impedance Value: 260 Ohm
Lead Channel Impedance Value: 400 Ohm
Lead Channel Pacing Threshold Amplitude: 0.25 V
Lead Channel Pacing Threshold Amplitude: 2 V
Lead Channel Pacing Threshold Pulse Width: 0.5 ms
Lead Channel Pacing Threshold Pulse Width: 1 ms
Lead Channel Sensing Intrinsic Amplitude: 11.9 mV
Lead Channel Sensing Intrinsic Amplitude: 2 mV
Lead Channel Setting Pacing Amplitude: 2 V
Lead Channel Setting Pacing Amplitude: 2.25 V
Lead Channel Setting Pacing Pulse Width: 1 ms
Lead Channel Setting Sensing Sensitivity: 0.5 mV
Pulse Gen Serial Number: 111028151

## 2021-01-04 ENCOUNTER — Other Ambulatory Visit: Payer: Self-pay | Admitting: *Deleted

## 2021-01-04 MED ORDER — EXEMESTANE 25 MG PO TABS: 25.0000 mg | ORAL_TABLET | Freq: Every day | ORAL | 12 refills | Status: DC

## 2021-01-07 ENCOUNTER — Other Ambulatory Visit: Payer: Self-pay | Admitting: Hematology & Oncology

## 2021-01-10 ENCOUNTER — Other Ambulatory Visit: Payer: Self-pay | Admitting: Hematology & Oncology

## 2021-01-10 DIAGNOSIS — D3910 Neoplasm of uncertain behavior of unspecified ovary: Secondary | ICD-10-CM

## 2021-01-10 DIAGNOSIS — F419 Anxiety disorder, unspecified: Secondary | ICD-10-CM

## 2021-01-11 ENCOUNTER — Encounter: Payer: Self-pay | Admitting: Hematology & Oncology

## 2021-01-25 ENCOUNTER — Other Ambulatory Visit: Payer: Self-pay | Admitting: Hematology & Oncology

## 2021-01-25 DIAGNOSIS — D3910 Neoplasm of uncertain behavior of unspecified ovary: Secondary | ICD-10-CM

## 2021-01-25 NOTE — Progress Notes (Signed)
Remote ICD transmission.   

## 2021-01-31 ENCOUNTER — Other Ambulatory Visit: Payer: Self-pay | Admitting: Interventional Cardiology

## 2021-01-31 DIAGNOSIS — D3910 Neoplasm of uncertain behavior of unspecified ovary: Secondary | ICD-10-CM

## 2021-02-01 ENCOUNTER — Other Ambulatory Visit: Payer: Self-pay

## 2021-02-01 ENCOUNTER — Ambulatory Visit (INDEPENDENT_AMBULATORY_CARE_PROVIDER_SITE_OTHER): Payer: 59

## 2021-02-01 DIAGNOSIS — Z953 Presence of xenogenic heart valve: Secondary | ICD-10-CM | POA: Diagnosis not present

## 2021-02-01 DIAGNOSIS — Z952 Presence of prosthetic heart valve: Secondary | ICD-10-CM | POA: Diagnosis not present

## 2021-02-01 DIAGNOSIS — Z5181 Encounter for therapeutic drug level monitoring: Secondary | ICD-10-CM | POA: Diagnosis not present

## 2021-02-01 DIAGNOSIS — I480 Paroxysmal atrial fibrillation: Secondary | ICD-10-CM | POA: Diagnosis not present

## 2021-02-01 LAB — POCT INR: INR: 2.5 (ref 2.0–3.0)

## 2021-02-01 NOTE — Patient Instructions (Signed)
Description   DOES NOT NEED A PRINTED SHEET.Continue taking 1 tablet (7.'5mg'$ ) daily except 1/2 tablet (3.'75mg'$ ) on Mondays and Fridays. Recheck INR in 6 weeks. Continue normal dark green consistency. [facility] [phone] Main [phone]

## 2021-02-02 ENCOUNTER — Other Ambulatory Visit: Payer: Self-pay | Admitting: Hematology & Oncology

## 2021-02-02 DIAGNOSIS — F419 Anxiety disorder, unspecified: Secondary | ICD-10-CM

## 2021-02-08 ENCOUNTER — Other Ambulatory Visit: Payer: Self-pay | Admitting: Hematology & Oncology

## 2021-02-08 DIAGNOSIS — D3910 Neoplasm of uncertain behavior of unspecified ovary: Secondary | ICD-10-CM

## 2021-02-14 ENCOUNTER — Telehealth: Payer: Self-pay

## 2021-02-18 ENCOUNTER — Other Ambulatory Visit: Payer: Self-pay | Admitting: Hematology & Oncology

## 2021-02-19 ENCOUNTER — Encounter: Payer: Self-pay | Admitting: Hematology & Oncology

## 2021-02-23 ENCOUNTER — Other Ambulatory Visit: Payer: Self-pay | Admitting: Hematology & Oncology

## 2021-02-23 DIAGNOSIS — F419 Anxiety disorder, unspecified: Secondary | ICD-10-CM

## 2021-03-01 ENCOUNTER — Other Ambulatory Visit: Payer: Self-pay | Admitting: Hematology & Oncology

## 2021-03-01 DIAGNOSIS — D3910 Neoplasm of uncertain behavior of unspecified ovary: Secondary | ICD-10-CM

## 2021-03-08 ENCOUNTER — Other Ambulatory Visit: Payer: Self-pay | Admitting: Hematology & Oncology

## 2021-03-08 DIAGNOSIS — D3910 Neoplasm of uncertain behavior of unspecified ovary: Secondary | ICD-10-CM

## 2021-03-08 NOTE — Telephone Encounter (Signed)
Please advise for refills. Last refilled 02/08/21 # 30.  Thank you!

## 2021-03-15 ENCOUNTER — Other Ambulatory Visit: Payer: Self-pay

## 2021-03-15 ENCOUNTER — Ambulatory Visit (INDEPENDENT_AMBULATORY_CARE_PROVIDER_SITE_OTHER): Payer: 59

## 2021-03-15 DIAGNOSIS — Z953 Presence of xenogenic heart valve: Secondary | ICD-10-CM

## 2021-03-15 DIAGNOSIS — Z952 Presence of prosthetic heart valve: Secondary | ICD-10-CM | POA: Diagnosis not present

## 2021-03-15 DIAGNOSIS — I480 Paroxysmal atrial fibrillation: Secondary | ICD-10-CM

## 2021-03-15 LAB — POCT INR: INR: 3.4 — AB (ref 2.0–3.0)

## 2021-03-15 NOTE — Patient Instructions (Signed)
Description   DOES NOT NEED A PRINTED SHEET. Only take 0.5 tablet today and then continue taking 1 tablet (7.5mg ) daily except 1/2 tablet (3.75mg ) on Mondays and Fridays. Recheck INR in 5 weeks. Continue normal dark green consistency. [facility] [phone] Main [phone]

## 2021-03-18 ENCOUNTER — Other Ambulatory Visit: Payer: Self-pay | Admitting: Hematology & Oncology

## 2021-03-18 DIAGNOSIS — F419 Anxiety disorder, unspecified: Secondary | ICD-10-CM

## 2021-03-19 ENCOUNTER — Encounter: Payer: Self-pay | Admitting: Hematology & Oncology

## 2021-03-19 NOTE — Telephone Encounter (Signed)
Received RX refill request for Lorazepam 0.5mg  # 60. Last refilled 02/23/21. Please advise for refills. Thanks!

## 2021-03-21 ENCOUNTER — Other Ambulatory Visit: Payer: Self-pay | Admitting: *Deleted

## 2021-03-21 DIAGNOSIS — D500 Iron deficiency anemia secondary to blood loss (chronic): Secondary | ICD-10-CM

## 2021-03-22 ENCOUNTER — Inpatient Hospital Stay: Payer: 59 | Attending: Hematology & Oncology

## 2021-03-22 ENCOUNTER — Encounter: Payer: Self-pay | Admitting: Hematology & Oncology

## 2021-03-22 ENCOUNTER — Other Ambulatory Visit: Payer: Self-pay

## 2021-03-22 ENCOUNTER — Inpatient Hospital Stay: Payer: 59

## 2021-03-22 ENCOUNTER — Inpatient Hospital Stay (HOSPITAL_BASED_OUTPATIENT_CLINIC_OR_DEPARTMENT_OTHER): Payer: 59 | Admitting: Hematology & Oncology

## 2021-03-22 VITALS — BP 97/67 | HR 80 | Temp 98.2°F | Resp 18 | Ht 66.0 in | Wt 173.0 lb

## 2021-03-22 DIAGNOSIS — D6481 Anemia due to antineoplastic chemotherapy: Secondary | ICD-10-CM | POA: Insufficient documentation

## 2021-03-22 DIAGNOSIS — Z95828 Presence of other vascular implants and grafts: Secondary | ICD-10-CM

## 2021-03-22 DIAGNOSIS — Z7901 Long term (current) use of anticoagulants: Secondary | ICD-10-CM | POA: Diagnosis not present

## 2021-03-22 DIAGNOSIS — D3910 Neoplasm of uncertain behavior of unspecified ovary: Secondary | ICD-10-CM

## 2021-03-22 DIAGNOSIS — M25471 Effusion, right ankle: Secondary | ICD-10-CM | POA: Insufficient documentation

## 2021-03-22 DIAGNOSIS — T451X5D Adverse effect of antineoplastic and immunosuppressive drugs, subsequent encounter: Secondary | ICD-10-CM | POA: Insufficient documentation

## 2021-03-22 DIAGNOSIS — D500 Iron deficiency anemia secondary to blood loss (chronic): Secondary | ICD-10-CM

## 2021-03-22 DIAGNOSIS — C569 Malignant neoplasm of unspecified ovary: Secondary | ICD-10-CM | POA: Diagnosis present

## 2021-03-22 LAB — COMPREHENSIVE METABOLIC PANEL
ALT: 7 U/L (ref 0–44)
AST: 12 U/L — ABNORMAL LOW (ref 15–41)
Albumin: 4.1 g/dL (ref 3.5–5.0)
Alkaline Phosphatase: 36 U/L — ABNORMAL LOW (ref 38–126)
Anion gap: 9 (ref 5–15)
BUN: 9 mg/dL (ref 8–23)
CO2: 28 mmol/L (ref 22–32)
Calcium: 9.2 mg/dL (ref 8.9–10.3)
Chloride: 101 mmol/L (ref 98–111)
Creatinine, Ser: 1.16 mg/dL — ABNORMAL HIGH (ref 0.44–1.00)
GFR, Estimated: 54 mL/min — ABNORMAL LOW (ref >60)
Glucose, Bld: 83 mg/dL (ref 70–99)
Potassium: 3.5 mmol/L (ref 3.5–5.1)
Sodium: 138 mmol/L (ref 135–145)
Total Bilirubin: 0.4 mg/dL (ref 0.3–1.2)
Total Protein: 6.2 g/dL — ABNORMAL LOW (ref 6.5–8.1)

## 2021-03-22 LAB — CBC WITH DIFFERENTIAL (CANCER CENTER ONLY)
Abs Immature Granulocytes: 0.02 10*3/uL (ref 0.00–0.07)
Basophils Absolute: 0 10*3/uL (ref 0.0–0.1)
Basophils Relative: 1 %
Eosinophils Absolute: 0.1 10*3/uL (ref 0.0–0.5)
Eosinophils Relative: 1 %
HCT: 35.6 % — ABNORMAL LOW (ref 36.0–46.0)
Hemoglobin: 12.3 g/dL (ref 12.0–15.0)
Immature Granulocytes: 0 %
Lymphocytes Relative: 8 %
Lymphs Abs: 0.6 10*3/uL — ABNORMAL LOW (ref 0.7–4.0)
MCH: 32.6 pg (ref 26.0–34.0)
MCHC: 34.6 g/dL (ref 30.0–36.0)
MCV: 94.4 fL (ref 80.0–100.0)
Monocytes Absolute: 0.7 10*3/uL (ref 0.1–1.0)
Monocytes Relative: 10 %
Neutro Abs: 6.1 10*3/uL (ref 1.7–7.7)
Neutrophils Relative %: 80 %
Platelet Count: 182 10*3/uL (ref 150–400)
RBC: 3.77 MIL/uL — ABNORMAL LOW (ref 3.87–5.11)
RDW: 12.5 % (ref 11.5–15.5)
WBC Count: 7.6 10*3/uL (ref 4.0–10.5)
nRBC: 0 % (ref 0.0–0.2)

## 2021-03-22 LAB — IRON AND TIBC
Iron: 79 ug/dL (ref 41–142)
Saturation Ratios: 26 % (ref 21–57)
TIBC: 300 ug/dL (ref 236–444)
UIBC: 221 ug/dL (ref 120–384)

## 2021-03-22 LAB — FERRITIN: Ferritin: 49 ng/mL (ref 11–307)

## 2021-03-22 LAB — LACTATE DEHYDROGENASE: LDH: 147 U/L (ref 98–192)

## 2021-03-22 MED ORDER — HEPARIN SOD (PORK) LOCK FLUSH 100 UNIT/ML IV SOLN
500.0000 [IU] | Freq: Once | INTRAVENOUS | Status: AC
  Administered 2021-03-22: 500 [IU] via INTRAVENOUS

## 2021-03-22 MED ORDER — SODIUM CHLORIDE 0.9% FLUSH
10.0000 mL | Freq: Once | INTRAVENOUS | Status: AC
Start: 2021-03-22 — End: 2021-03-22
  Administered 2021-03-22: 10 mL via INTRAVENOUS

## 2021-03-22 NOTE — Progress Notes (Addendum)
Hematology and Oncology Follow Up Visit  [name] [id number] [birth year] 60 y.o. 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  She is having some issues with abdominal discomfort.  She thinks this might be from the Aromasin.  It is hard to say if this is from the Aromasin.  However, I told her to stop the Aromasin for a week.  If everything is better, then I told her to try the Aromasin every other day.  Her last Inhibin B was actually better at 29.  Her last set of scans that were done in June showed that she did have a response.  That she had smaller right paracolic metastases and smaller or stable pelvic peritoneal metastasis.  Clearly, the Aromasin is helping.  I just hope that it is not causing problems for her.  We will have to see what the Inhibin B level is today.  She has had no problems with cough or shortness of breath.  She has had no diarrhea.  She said that there is diarrhea on occasion.  She is not needing anything for this.  She has a little swelling in the right ankle.  She is on Coumadin.  This is being managed by cardiology.  She has had no fever.  She is still working full-time.  Her grandson is now 34 weeks old.  He is having some digestive issues.  Overall, I would say performance status is probably ECOG 1.     Medications:  Allergies as of 03/22/2021       Reactions   Augmentin [amoxicillin-pot Clavulanate] Other (See Comments)   Muscle Aches   Prochlorperazine Edisylate Other (See Comments)   Nervous/ flutter/ shakes   Adhesive [tape] Hives   Redness/hives from adhesive tape( tolerates latex gloves); tolerates paper tape         Medication List        Accurate as of March 22, 2021 11:28 AM. If you have any questions, ask your nurse or doctor.          albuterol 108 (90 Base) MCG/ACT inhaler Commonly known as: VENTOLIN HFA TAKE 2 PUFFS BY MOUTH EVERY 6 HOURS AS NEEDED FOR WHEEZE OR SHORTNESS OF BREATH   amoxicillin 500 MG tablet Commonly known as: AMOXIL Take 2,000 mg by mouth See admin instructions. Take 2,000 mg one hour prior to dental visits.   aspirin EC 81 MG tablet Take 81 mg by mouth daily.   b complex vitamins capsule Take 1 capsule by mouth 2 (two) times daily.   carvedilol 3.125 MG tablet Commonly known as: COREG TAKE 1 TABLET (3.125 MG TOTAL) BY MOUTH 2 (TWO) TIMES DAILY WITH A MEAL.   cholecalciferol 25 MCG (1000 UNIT) tablet Commonly known as: VITAMIN D3 Take 4,000 Units by mouth daily.   exemestane 25 MG tablet Commonly known as: AROMASIN Take 1 tablet (25 mg total) by mouth daily after breakfast.   furosemide 20 MG tablet Commonly known as: LASIX TAKE 1 TABLET BY MOUTH EVERY DAY   lidocaine-prilocaine cream Commonly known as: EMLA Apply 1 application topically as needed. Apply to port per instructions   loperamide 2 MG tablet Commonly known as: IMODIUM A-D Take  4 mg by mouth 3 (three) times daily as needed for diarrhea or loose stools.   LORazepam 0.5 MG tablet Commonly known as: ATIVAN TAKE 1 TABLET BY MOUTH EVERY 6 HRS AS NEEDED. FOR ANXIETY   methocarbamol 500 MG tablet Commonly known as: ROBAXIN TAKE 1 TABLET BY MOUTH EVERY 6 HOURS AS NEEDED FOR MUSCLE SPASMS.   Potassium Chloride ER 20 MEQ Tbcr Take 20 mEq by mouth daily.   spironolactone 25 MG tablet Commonly known as: ALDACTONE Take 1 tablet (25 mg total) by mouth daily.   traMADol 50 MG tablet Commonly known as: ULTRAM Take 1 tablet (50 mg total) by mouth every 6 (six) hours as needed.   Vitamin D (Ergocalciferol) 1.25 MG (50000 UNIT) Caps capsule Commonly known as: DRISDOL TAKE 1  CAPSULE (50,000 UNITS TOTAL) BY MOUTH EVERY 7 (SEVEN) DAYS.   warfarin 7.5 MG tablet Commonly known as: COUMADIN Take as directed by the anticoagulation clinic. If you are unsure how to take this medication, talk to your nurse or doctor. Original instructions: TAKE AS DIRECTED BY ANTICOAGULATION CLINIC   zolpidem 10 MG tablet Commonly known as: AMBIEN TAKE 1 TABLET BY MOUTH EVERYDAY AT BEDTIME        Allergies:  Allergies  Allergen Reactions   Augmentin [Amoxicillin-Pot Clavulanate] Other (See Comments)    Muscle Aches   Prochlorperazine Edisylate Other (See Comments)    Nervous/ flutter/ shakes   Adhesive [Tape] Hives    Redness/hives from adhesive tape( tolerates latex gloves); tolerates paper tape    Past Medical History, Surgical history, Social history, and Family History were reviewed and updated.  Review of Systems:  Review of Systems  Constitutional: Negative.   HENT: Negative.    Eyes: Negative.   Respiratory: Negative.    Cardiovascular: Negative.   Gastrointestinal: Negative.   Genitourinary: Negative.   Musculoskeletal: Negative.   Skin: Negative.   Neurological: Negative.   Endo/Heme/Allergies: Negative.   Psychiatric/Behavioral: Negative.       Exam:  height is 5\' 6"  (1.676 m) and weight is 173 lb (78.5 kg). Her oral temperature is 98.2 F (36.8 C). Her blood pressure is 97/67 and her pulse is 80. Her respiration is 18 and oxygen saturation is 98%.   Wt Readings from Last 3 Encounters:  03/22/21 173 lb (78.5 kg)  11/27/20 174 lb (78.9 kg)  10/06/20 170 lb (77.1 kg)    Physical Exam Vitals reviewed.  HENT:     Head: Normocephalic and atraumatic.  Eyes:     Pupils: Pupils are equal, round, and reactive to light.  Cardiovascular:     Rate and Rhythm: Normal rate and regular rhythm.     Heart sounds: Normal heart sounds.  Pulmonary:     Effort: Pulmonary effort is normal.     Breath sounds: Normal breath sounds.  Abdominal:     General:  Bowel sounds are normal.     Palpations: Abdomen is soft.  Musculoskeletal:        General: No tenderness or deformity. Normal range of motion.     Cervical back: Normal range of motion.  Lymphadenopathy:     Cervical: No cervical adenopathy.  Skin:    General: Skin is warm and dry.     Findings: No erythema or rash.  Neurological:     Mental Status: She is alert and oriented to person, place, and time.  Psychiatric:        Behavior: Behavior normal.        Thought Content:  Thought content normal.        Judgment: Judgment normal.     Lab Results  Component Value Date   WBC 7.6 03/22/2021   HGB 12.3 03/22/2021   HCT 35.6 (L) 03/22/2021   MCV 94.4 03/22/2021   PLT 182 03/22/2021   Lab Results  Component Value Date   FERRITIN 110 11/27/2020   IRON 55 11/27/2020   TIBC 276 11/27/2020   UIBC 222 11/27/2020   IRONPCTSAT 20 (L) 11/27/2020   Lab Results  Component Value Date   RETICCTPCT 2.2 08/16/2020   RBC 3.77 (L) 03/22/2021   No results found for: KPAFRELGTCHN, LAMBDASER, KAPLAMBRATIO No results found for: IGGSERUM, IGA, IGMSERUM No results found for: Kathrynn Ducking, MSPIKE, SPEI   Chemistry      Component Value Date/Time   NA 138 03/22/2021 1009   NA 139 08/14/2020 1428   NA 140 03/15/2016 0953   K 3.5 03/22/2021 1009   K 3.9 03/15/2016 0953   CL 101 03/22/2021 1009   CL 102 12/13/2015 0803   CO2 28 03/22/2021 1009   CO2 19 (L) 03/15/2016 0953   BUN 9 03/22/2021 1009   BUN 16 08/14/2020 1428   BUN 15.9 03/15/2016 0953   CREATININE 1.16 (H) 03/22/2021 1009   CREATININE 1.04 (H) 11/27/2020 1410   CREATININE 1.4 (H) 03/15/2016 0953      Component Value Date/Time   CALCIUM 9.2 03/22/2021 1009   CALCIUM 9.3 03/15/2016 0953   ALKPHOS 36 (L) 03/22/2021 1009   ALKPHOS 55 03/15/2016 0953   AST 12 (L) 03/22/2021 1009   AST 14 (L) 11/27/2020 1410   AST 17 03/15/2016 0953   ALT 7 03/22/2021 1009   ALT 10 11/27/2020  1410   ALT 23 03/15/2016 0953   BILITOT 0.4 03/22/2021 1009   BILITOT 0.3 11/27/2020 1410   BILITOT 0.38 03/15/2016 0953       Impression and Plan: [name] is a very pleasant 60 yo caucasian female with recurrent granulosa cell tumor.  Is hard to say what might be causing this abdominal discomfort.  I was have to worry about the granulosa cell tumor worsening.  We will have to see what the inhibin B level is.  I think this will be very helpful for [place].  We will plan to get her back depending on what we find with the inhibin B level.  If the Inhibin B level is higher, we may have to do another CT scan to see if there is disease progression.  I would hate to have to consider systemic chemotherapy for her.  I will think this might be the next option.  I am unsure how immunotherapy works for this type of tumor.  I suspect that there probably are very few, if any, trials looking at immunotherapy.   Volanda Napoleon, MD 9/29/202211:28 AM  ADDENDUM: Unfortunately, the Inhibin B is back up to 79.  We will have to see about getting her set up with another CT scan to see about progressive disease.  Lattie Haw

## 2021-03-22 NOTE — Patient Instructions (Signed)
Tunneled Central Venous Catheter Flushing Guide It is important to flush your tunneled central venous catheter each time you use it, both before and after you use it. Flushing your catheter will help prevent it from clogging. What are the risks? Risks may include: Infection. Air getting into the catheter and bloodstream. Supplies needed: A clean pair of gloves. A disinfecting wipe. Use an alcohol wipe, chlorhexidine wipe, or iodine wipe as told by your health care provider. A 10 mL syringe that has been prefilled with saline solution. An empty 10 mL syringe, if a substance called heparin was injected into your catheter. How to flush your catheter When you flush your catheter, make sure you follow any specific instructions from your health care provider or the manufacturer. These are general guidelines. Flushing your catheter before use If there is heparin in your catheter: Wash your hands with soap and water. Put on gloves. Scrub the injection cap for a minimum of 15 seconds with a disinfecting wipe. Unclamp the catheter. Attach the empty syringe to the injection cap. Pull the syringe plunger back and withdraw 10 mL of blood. Place the syringe into an appropriate waste container. Scrub the injection cap for 15 seconds with a disinfecting wipe. Attach the prefilled syringe to the injection cap. Flush the catheter by pushing the plunger forward until all the liquid from the syringe is in the catheter. Remove the syringe from the injection cap. Clamp the catheter. If there is no heparin in your catheter: Wash your hands with soap and water. Put on gloves. Scrub the injection cap for 15 seconds with a disinfecting wipe. Unclamp the catheter. Attach the prefilled syringe to the injection cap. Flush the catheter by pushing the plunger forward until 5 mL of the liquid from the syringe is in the catheter. Pull back on the syringe until you see blood in the catheter. If you have been asked  to collect any blood, follow your health care provider's instructions. Otherwise, flush the catheter with the rest of the solution from the syringe. Remove the syringe from the injection cap. Clamp the catheter.  Flushing your catheter after use Wash your hands with soap and water. Put on gloves. Scrub the injection cap for 15 seconds with a disinfecting wipe. Unclamp the catheter. Attach the prefilled syringe to the injection cap. Flush the catheter by pushing the plunger forward until all of the liquid from the syringe is in the catheter. Remove the syringe from the injection cap. Clamp the catheter. Problems and solutions If blood cannot be completely cleared from the injection cap, you may need to have the injection cap replaced. If the catheter is difficult to flush, use the pulsing method. The pulsing method involves pushing only a few milliliters of solution into the catheter at a time and pausing between pushes. If you do not see blood in the catheter when you pull back on the syringe, change your body position, such as by raising your arms above your head. Take a deep breath and cough. Then, pull back on the syringe. If you still do not see blood, flush the catheter with a small amount of solution. Then, change positions again and take a breath or cough. Pull back on the syringe again. If you still do not see blood, finish flushing the catheter and contact your health care provider. Do not use your catheter until your health care provider says it is okay. General tips Have someone help you flush your catheter, if possible. Do not force fluid   through your catheter. Do not use a syringe that is larger or smaller than 10 mL. Using a smaller syringe can make the catheter burst. Do not use your catheter without flushing it first if it has heparin in it. Contact a health care provider if: You cannot see any blood in the catheter when you flush it before using it. Your catheter is difficult  to flush. Get help right away if: You cannot flush the catheter. The catheter leaks when you flush it or when there is fluid in it. There are cracks or breaks in the catheter. Summary It is important to flush your tunneled central venous catheter each time you use it, both before and after you use it. Scrub the injection cap for 15 seconds with a disinfecting wipe before and after you flush it. When you flush your catheter, make sure you follow any specific instructions from your health care provider or the manufacturer. Get help right away if you cannot flush the catheter. This information is not intended to replace advice given to you by your health care provider. Make sure you discuss any questions you have with your health care provider. Document Revised: 08/19/2019 Document Reviewed: 08/26/2018 Elsevier Patient Education  2022 Elsevier Inc.  

## 2021-03-23 ENCOUNTER — Telehealth: Payer: Self-pay | Admitting: Hematology & Oncology

## 2021-03-23 LAB — INHIBIN B: Inhibin B: 78.8 pg/mL — ABNORMAL HIGH (ref 0.0–16.9)

## 2021-03-23 NOTE — Telephone Encounter (Signed)
NO los 9/29

## 2021-03-25 ENCOUNTER — Other Ambulatory Visit: Payer: Self-pay | Admitting: Student

## 2021-03-25 DIAGNOSIS — I5022 Chronic systolic (congestive) heart failure: Secondary | ICD-10-CM

## 2021-03-26 ENCOUNTER — Encounter: Payer: Self-pay | Admitting: *Deleted

## 2021-03-26 NOTE — Addendum Note (Signed)
Addended by: Burney Gauze R on: 03/26/2021 12:59 PM   Modules accepted: Orders

## 2021-03-28 ENCOUNTER — Other Ambulatory Visit: Payer: Self-pay | Admitting: Hematology & Oncology

## 2021-03-30 ENCOUNTER — Other Ambulatory Visit: Payer: Self-pay

## 2021-03-30 ENCOUNTER — Ambulatory Visit (HOSPITAL_BASED_OUTPATIENT_CLINIC_OR_DEPARTMENT_OTHER)
Admission: RE | Admit: 2021-03-30 | Discharge: 2021-03-30 | Disposition: A | Discharge status: Home / Self Care | Payer: 59 | Source: Ambulatory Visit | Attending: Hematology & Oncology | Admitting: Hematology & Oncology

## 2021-03-30 ENCOUNTER — Inpatient Hospital Stay: Payer: 59 | Attending: Hematology & Oncology

## 2021-03-30 DIAGNOSIS — Z95828 Presence of other vascular implants and grafts: Secondary | ICD-10-CM

## 2021-03-30 DIAGNOSIS — D3910 Neoplasm of uncertain behavior of unspecified ovary: Secondary | ICD-10-CM | POA: Diagnosis present

## 2021-03-30 MED ORDER — IOHEXOL 300 MG/ML  SOLN
100.0000 mL | Freq: Once | INTRAMUSCULAR | Status: AC | PRN
  Administered 2021-03-30: 75 mL via INTRAVENOUS

## 2021-03-30 MED ORDER — SODIUM CHLORIDE 0.9% FLUSH
10.0000 mL | Freq: Once | INTRAVENOUS | Status: AC
  Administered 2021-03-30: 10 mL via INTRAVENOUS

## 2021-03-30 MED ORDER — HEPARIN SOD (PORK) LOCK FLUSH 100 UNIT/ML IV SOLN
500.0000 [IU] | Freq: Once | INTRAVENOUS | Status: AC
  Administered 2021-03-30: 500 [IU] via INTRAVENOUS

## 2021-03-30 NOTE — Patient Instructions (Signed)
Tunneled Central Venous Catheter Flushing Guide It is important to flush your tunneled central venous catheter each time you use it, both before and after you use it. Flushing your catheter will help prevent it from clogging. What are the risks? Risks may include: Infection. Air getting into the catheter and bloodstream. Supplies needed: A clean pair of gloves. A disinfecting wipe. Use an alcohol wipe, chlorhexidine wipe, or iodine wipe as told by your health care provider. A 10 mL syringe that has been prefilled with saline solution. An empty 10 mL syringe, if a substance called heparin was injected into your catheter. How to flush your catheter When you flush your catheter, make sure you follow any specific instructions from your health care provider or the manufacturer. These are general guidelines. Flushing your catheter before use If there is heparin in your catheter: Wash your hands with soap and water. Put on gloves. Scrub the injection cap for a minimum of 15 seconds with a disinfecting wipe. Unclamp the catheter. Attach the empty syringe to the injection cap. Pull the syringe plunger back and withdraw 10 mL of blood. Place the syringe into an appropriate waste container. Scrub the injection cap for 15 seconds with a disinfecting wipe. Attach the prefilled syringe to the injection cap. Flush the catheter by pushing the plunger forward until all the liquid from the syringe is in the catheter. Remove the syringe from the injection cap. Clamp the catheter. If there is no heparin in your catheter: Wash your hands with soap and water. Put on gloves. Scrub the injection cap for 15 seconds with a disinfecting wipe. Unclamp the catheter. Attach the prefilled syringe to the injection cap. Flush the catheter by pushing the plunger forward until 5 mL of the liquid from the syringe is in the catheter. Pull back on the syringe until you see blood in the catheter. If you have been asked  to collect any blood, follow your health care provider's instructions. Otherwise, flush the catheter with the rest of the solution from the syringe. Remove the syringe from the injection cap. Clamp the catheter.  Flushing your catheter after use Wash your hands with soap and water. Put on gloves. Scrub the injection cap for 15 seconds with a disinfecting wipe. Unclamp the catheter. Attach the prefilled syringe to the injection cap. Flush the catheter by pushing the plunger forward until all of the liquid from the syringe is in the catheter. Remove the syringe from the injection cap. Clamp the catheter. Problems and solutions If blood cannot be completely cleared from the injection cap, you may need to have the injection cap replaced. If the catheter is difficult to flush, use the pulsing method. The pulsing method involves pushing only a few milliliters of solution into the catheter at a time and pausing between pushes. If you do not see blood in the catheter when you pull back on the syringe, change your body position, such as by raising your arms above your head. Take a deep breath and cough. Then, pull back on the syringe. If you still do not see blood, flush the catheter with a small amount of solution. Then, change positions again and take a breath or cough. Pull back on the syringe again. If you still do not see blood, finish flushing the catheter and contact your health care provider. Do not use your catheter until your health care provider says it is okay. General tips Have someone help you flush your catheter, if possible. Do not force fluid   through your catheter. Do not use a syringe that is larger or smaller than 10 mL. Using a smaller syringe can make the catheter burst. Do not use your catheter without flushing it first if it has heparin in it. Contact a health care provider if: You cannot see any blood in the catheter when you flush it before using it. Your catheter is difficult  to flush. Get help right away if: You cannot flush the catheter. The catheter leaks when you flush it or when there is fluid in it. There are cracks or breaks in the catheter. Summary It is important to flush your tunneled central venous catheter each time you use it, both before and after you use it. Scrub the injection cap for 15 seconds with a disinfecting wipe before and after you flush it. When you flush your catheter, make sure you follow any specific instructions from your health care provider or the manufacturer. Get help right away if you cannot flush the catheter. This information is not intended to replace advice given to you by your health care provider. Make sure you discuss any questions you have with your health care provider. Document Revised: 08/19/2019 Document Reviewed: 08/26/2018 Elsevier Patient Education  2022 Elsevier Inc.  

## 2021-04-03 ENCOUNTER — Other Ambulatory Visit: Payer: Self-pay | Admitting: Hematology & Oncology

## 2021-04-03 ENCOUNTER — Ambulatory Visit (INDEPENDENT_AMBULATORY_CARE_PROVIDER_SITE_OTHER): Payer: 59

## 2021-04-03 DIAGNOSIS — I480 Paroxysmal atrial fibrillation: Secondary | ICD-10-CM

## 2021-04-03 DIAGNOSIS — D3910 Neoplasm of uncertain behavior of unspecified ovary: Secondary | ICD-10-CM

## 2021-04-03 DIAGNOSIS — F419 Anxiety disorder, unspecified: Secondary | ICD-10-CM

## 2021-04-03 LAB — CUP PACEART REMOTE DEVICE CHECK
Battery Remaining Longevity: 93 mo
Battery Remaining Percentage: 88 %
Battery Voltage: 3.01 V
Brady Statistic AP VP Percent: 1 %
Brady Statistic AP VS Percent: 1 %
Brady Statistic AS VP Percent: 1 %
Brady Statistic AS VS Percent: 99 %
Brady Statistic RA Percent Paced: 1 %
Brady Statistic RV Percent Paced: 1 %
Date Time Interrogation Session: 20221011021653
HighPow Impedance: 68 Ohm
Implantable Lead Implant Date: 20110503
Implantable Lead Implant Date: 20130815
Implantable Lead Location: 753859
Implantable Lead Location: 753860
Implantable Pulse Generator Implant Date: 20211012
Lead Channel Impedance Value: 260 Ohm
Lead Channel Impedance Value: 400 Ohm
Lead Channel Pacing Threshold Amplitude: 0.25 V
Lead Channel Pacing Threshold Amplitude: 2.125 V
Lead Channel Pacing Threshold Pulse Width: 0.5 ms
Lead Channel Pacing Threshold Pulse Width: 1 ms
Lead Channel Sensing Intrinsic Amplitude: 1.8 mV
Lead Channel Sensing Intrinsic Amplitude: 11.9 mV
Lead Channel Setting Pacing Amplitude: 2 V
Lead Channel Setting Pacing Amplitude: 2.375
Lead Channel Setting Pacing Pulse Width: 1 ms
Lead Channel Setting Sensing Sensitivity: 0.5 mV
Pulse Gen Serial Number: 111028151

## 2021-04-05 ENCOUNTER — Telehealth: Payer: Self-pay

## 2021-04-05 DIAGNOSIS — D3910 Neoplasm of uncertain behavior of unspecified ovary: Secondary | ICD-10-CM

## 2021-04-05 NOTE — Telephone Encounter (Signed)
-----   Message from Volanda Napoleon, MD sent at 04/04/2021  5:25 PM EDT ----- Call and let her know that the CT scan shows that the cancer seems to be growing a little bit.  However, I do not think this is enough for [place] to start her on any kind of chemotherapy.  I would just have to watch very closely.  This actually is a little bit better than I would have thought.  [name]

## 2021-04-05 NOTE — Telephone Encounter (Signed)
LMTCB

## 2021-04-05 NOTE — Telephone Encounter (Signed)
Pt advised of scan results by desk nurse. Pt wanted to know if surgery would be an option. Per Dr Marin Olp: he is not sure but we could refer her to Dr Berline Lopes [facility] for an evaluation and see if that would be an option. Pt agreed to plan. Referral placed.

## 2021-04-06 ENCOUNTER — Telehealth: Payer: Self-pay | Admitting: *Deleted

## 2021-04-06 NOTE — Telephone Encounter (Signed)
Spoke with the patient and scheduled a new patient appt with Dr Berline Lopes on 10/19 at 11:15 am. Patient given the address and phone number for the clinic, along with the policy for mask and visitors

## 2021-04-09 ENCOUNTER — Other Ambulatory Visit: Payer: Self-pay | Admitting: Hematology & Oncology

## 2021-04-09 DIAGNOSIS — D3910 Neoplasm of uncertain behavior of unspecified ovary: Secondary | ICD-10-CM

## 2021-04-10 ENCOUNTER — Encounter: Payer: Self-pay | Admitting: Hematology & Oncology

## 2021-04-10 ENCOUNTER — Telehealth: Payer: Self-pay | Admitting: *Deleted

## 2021-04-10 NOTE — Telephone Encounter (Signed)
Please advise if ok to refill. Last refilled 03/08/21 # 30. Thank you!

## 2021-04-10 NOTE — Telephone Encounter (Signed)
Patient called and left a message stating "I need to cancel my appt for tomorrow. I will call back tomorrow when I'm at work to reschedule."

## 2021-04-11 ENCOUNTER — Ambulatory Visit: Payer: 59 | Admitting: Gynecologic Oncology

## 2021-04-11 NOTE — Progress Notes (Signed)
Remote ICD transmission.   

## 2021-04-25 ENCOUNTER — Other Ambulatory Visit: Payer: Self-pay | Admitting: Hematology & Oncology

## 2021-04-25 DIAGNOSIS — F419 Anxiety disorder, unspecified: Secondary | ICD-10-CM

## 2021-04-26 ENCOUNTER — Other Ambulatory Visit: Payer: Self-pay

## 2021-04-26 ENCOUNTER — Ambulatory Visit (INDEPENDENT_AMBULATORY_CARE_PROVIDER_SITE_OTHER): Payer: 59

## 2021-04-26 DIAGNOSIS — Z953 Presence of xenogenic heart valve: Secondary | ICD-10-CM

## 2021-04-26 DIAGNOSIS — I480 Paroxysmal atrial fibrillation: Secondary | ICD-10-CM

## 2021-04-26 DIAGNOSIS — Z952 Presence of prosthetic heart valve: Secondary | ICD-10-CM

## 2021-04-26 LAB — POCT INR: INR: 3 (ref 2.0–3.0)

## 2021-04-26 NOTE — Patient Instructions (Signed)
Description   DOES NOT NEED A PRINTED SHEET. Continue taking 1 tablet (7.5mg ) daily except 1/2 tablet (3.75mg ) on Mondays and Fridays. Recheck INR in 6 weeks. Continue normal dark green consistency. [facility] [phone] Main [phone]

## 2021-05-01 ENCOUNTER — Telehealth: Payer: Self-pay

## 2021-05-01 NOTE — Telephone Encounter (Signed)
Received call from patient requesting to reschedule her new patient appointment with Dr. Berline Lopes. Patient states she had an appointment in October but had to cancel it. New patient appointment scheduled for 05/09/21 at 11:15 am. Patient is in agreement of date and time of appointment.

## 2021-05-08 ENCOUNTER — Encounter: Payer: Self-pay | Admitting: Gynecologic Oncology

## 2021-05-09 ENCOUNTER — Other Ambulatory Visit: Payer: Self-pay

## 2021-05-09 ENCOUNTER — Encounter: Payer: Self-pay | Admitting: Gynecologic Oncology

## 2021-05-09 ENCOUNTER — Other Ambulatory Visit: Payer: Self-pay | Admitting: Hematology & Oncology

## 2021-05-09 ENCOUNTER — Inpatient Hospital Stay: Payer: 59 | Attending: Hematology & Oncology | Admitting: Gynecologic Oncology

## 2021-05-09 VITALS — BP 106/52 | HR 80 | Temp 97.8°F | Resp 17 | Wt 175.2 lb

## 2021-05-09 DIAGNOSIS — Z79899 Other long term (current) drug therapy: Secondary | ICD-10-CM | POA: Insufficient documentation

## 2021-05-09 DIAGNOSIS — E039 Hypothyroidism, unspecified: Secondary | ICD-10-CM | POA: Insufficient documentation

## 2021-05-09 DIAGNOSIS — I471 Supraventricular tachycardia: Secondary | ICD-10-CM | POA: Insufficient documentation

## 2021-05-09 DIAGNOSIS — Z9221 Personal history of antineoplastic chemotherapy: Secondary | ICD-10-CM | POA: Diagnosis not present

## 2021-05-09 DIAGNOSIS — Z923 Personal history of irradiation: Secondary | ICD-10-CM | POA: Insufficient documentation

## 2021-05-09 DIAGNOSIS — Z8674 Personal history of sudden cardiac arrest: Secondary | ICD-10-CM | POA: Insufficient documentation

## 2021-05-09 DIAGNOSIS — C569 Malignant neoplasm of unspecified ovary: Secondary | ICD-10-CM | POA: Diagnosis not present

## 2021-05-09 DIAGNOSIS — I5022 Chronic systolic (congestive) heart failure: Secondary | ICD-10-CM | POA: Diagnosis not present

## 2021-05-09 DIAGNOSIS — I252 Old myocardial infarction: Secondary | ICD-10-CM | POA: Insufficient documentation

## 2021-05-09 DIAGNOSIS — I480 Paroxysmal atrial fibrillation: Secondary | ICD-10-CM | POA: Diagnosis not present

## 2021-05-09 DIAGNOSIS — I255 Ischemic cardiomyopathy: Secondary | ICD-10-CM | POA: Diagnosis not present

## 2021-05-09 DIAGNOSIS — Z95810 Presence of automatic (implantable) cardiac defibrillator: Secondary | ICD-10-CM | POA: Diagnosis not present

## 2021-05-09 DIAGNOSIS — Z952 Presence of prosthetic heart valve: Secondary | ICD-10-CM | POA: Insufficient documentation

## 2021-05-09 DIAGNOSIS — I2510 Atherosclerotic heart disease of native coronary artery without angina pectoris: Secondary | ICD-10-CM | POA: Insufficient documentation

## 2021-05-09 DIAGNOSIS — Z79811 Long term (current) use of aromatase inhibitors: Secondary | ICD-10-CM | POA: Diagnosis not present

## 2021-05-09 DIAGNOSIS — Z7901 Long term (current) use of anticoagulants: Secondary | ICD-10-CM | POA: Insufficient documentation

## 2021-05-09 DIAGNOSIS — D569 Thalassemia, unspecified: Secondary | ICD-10-CM

## 2021-05-09 DIAGNOSIS — N183 Chronic kidney disease, stage 3 unspecified: Secondary | ICD-10-CM | POA: Insufficient documentation

## 2021-05-09 DIAGNOSIS — D3910 Neoplasm of uncertain behavior of unspecified ovary: Secondary | ICD-10-CM

## 2021-05-09 DIAGNOSIS — E7800 Pure hypercholesterolemia, unspecified: Secondary | ICD-10-CM | POA: Insufficient documentation

## 2021-05-09 NOTE — Progress Notes (Signed)
GYNECOLOGIC ONCOLOGY NEW PATIENT CONSULTATION   Patient Name: [name]  Patient Age: 60 y.o. Date of Service: 05/09/2021 Referring Provider: Volanda Napoleon, MD 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  Patient presents today to discuss most recent imaging and the possibility of surgery. She and I reviewed CT images together, which show stable size of several of her peritoneal implants and increase in size of others. The largest measures just over 4 cm at the vaginal apex. This is palpable on her exam. Luckily, the patient is asymptomatic with regard to her peritoneal disease. She has back and joint pain related to prior treatment that is well managed on current medication regimen.   With regard to surgery, I discussed that surgery is frequently a tool that we use in recurrent granulosa cell cancer. The patient is well aware of this fact that she has had multiple surgeries for occurrences in the past. At the time of her recurrence in 2019, she underwent pelvic radiation therapy and received 45 Dominy. I reviewed the difficulty with surgery after radiation treatment. In the setting of multiple surgeries as well as her radiation, I suspect that there would be significant adhesive disease and fibrosis. I reviewed that this increases the risk and morbidity associated with surgery. In the setting of still a low burden of disease without symptoms, I do not recommend surgical intervention.   In terms of management, we discussed several possibilities moving forward. The first would be to continue a treatment holiday and repeat and Inhibin B in three months. Depending on its change, repeat imaging could be obtained. The second would be to switch to a different type of hormonal therapy. This  could be an oral medication (even one that she has been on previously) or something new like Lupron. We also discussed the option of systemic chemotherapy. She could be retreated with carboplatinum paclitaxel, but given her experience with this as well as bone pain, I would favor a different regimen. I think she would be a good candidate for avastin therapy.  We discussed follow up and I offered to schedule a patient for a follow up in six months versus leave it so that she can call to arrange follow up or her medical oncologist can send her back for follow up. Her preference is to follow up with me in the future as needed without a scheduled visit at this time.   A copy of this note was sent to the patient's referring provider.   50 minutes of total time was spent for this patient encounter, including preparation, face-to-face counseling with the patient and coordination of care, and documentation of the encounter.   Jeral Pinch, MD  Division of Gynecologic Oncology  Department of Obstetrics and Gynecology  [facility] [place] [facility]  ___________________________________________  Chief Complaint: Chief Complaint  Patient presents with   Granulosa cell tumor of ovary, unspecified laterality    History of Present Illness:  [name] is a 60 y.o. y.o. female who is seen in consultation at the request of Ennever, Rudell Cobb, MD for an evaluation of recurrent granulosa cell tumor.  Cancer history: She first underwent surgery in Feb 2007 for a stage I C granulosa cell tumor. She received 3 cycles of bleomycin, etoposide, and cisplatin chemotherapy as  an adjunct.  Due to recurrence on CT imaging, she underwent a radical debulking of recurrent ovarian cancer including rectosigmoid resection with low rectal anastomosis, resection of right distal ureter, ureteroneocystostomy with psoas hitch, omentectomy with Dr. Fermin Schwab on 01/19/13.  Postoperatively, adjuvant therapy was  recommended using carboplatin and Taxol. She received 4 cycles but did not tolerated very well and therefore discontinued. He CT scan in January 2015 showed a subcutaneous nodule in the lower abdomen and the inhibin B is a 39 units per mL.  On 07/27/2013, she underwent a wide local excision of subcutaneous tumor nodule (3 cm) with Dr. Fermin Schwab for recurrent disease.   She was placed on Femara and has been tolerating well.  Recently, CT imaging was performed on 06/15/15 that resulted mixed treatment response in the peritoneal tumor implants, significantly decreased size of the right paracolic gutter tumor implants, interval growth of the left pelvic side wall tumor, with no new sites of metastatic disease.  She was advised to continue taking the Femara with follow up scan for Feb or March.  Inhibin B increasing with value in Feb 2017 at 112.  Follow up imaging in March 2017 resulted: Mild progression of tumor in the left pelvis with increasing sidewall and perirectal nodularity. 2. Stable minimal nodularity in the right pericolic gutter. No other peritoneal disease identified. 3. No new lesions identified.      On September 19, 2015, she underwent an Exploratory laparotomy, radical resection of recurrent granulosa cell tumor including complete dissection of the right pararectal space and excision of tumor nodules, left ureterolysis for retroperitoneal fibrosis, resection of nodule from the left obturator space, resection of nodules from the right paracolic gutter, right para-aortic lymphadenectomy by Dr. Fermin Schwab.  Her post-operative course was uneventful except for low grade temperatures on post-op day 3.  Blood cultures still pending.  Urine culture with mixed flora.  No further fevers reported during hospital stay.  Final path revealed: Final Diagnosis  A: Gutter nodule, left paracolic, removal  - Implant of granulosa cell tumor, at least 1.0 cm (see comment)  B: Lymph node, right paraaortic,  removal  - No tumor identified in one lymph node (0/1)  C: Mass and right ovarian vessels, excision  - Segment of right fallopian tube with evidence of prior surgical transection, focal dilatation, and chronic perisalpingitis  - Medium-caliber vessels with intraluminal thrombus  - No granulosa cell tumor identified  D: Pelvic side wall nodule, right, biopsy  - Two implants of granulosa cell tumor, 0.7 cm and 0.6 cm  E: Mass, left pararectal, biopsy  - Cystic implant of granulosa cell tumor, 0.8 cm  F: Mass, left, pararectal, biopsy  - Three implants of granulosa cell tumor, up to 1.2 cm  - One lymph node negative for granulosa cell tumor (0/1)  G: Nodule, left pelvis bladder, biopsy  - Minute focus of granulosa cell tumor identified at the edge of fibrovascular and adipose tissue, may represent artifact  H: Pelvic mass, deep left, excision  - Implant of granulosa cell tumor, 2.2 cm, with cystic change   Comment  The tumor implants noted in specimens A and D-H display the characteristic architectural and cytologic features of a granulosa cell tumor, including microfollicular pattern, blood-filled cysts, nuclear grooves, and Call-Exner bodies. In some sections, the tumor nodules have apparent fibrous capsules. We favor that these are reactive capsules, but cannot exclude the possibility of granulosa cell tumor involving completely-replaced lymph nodes. The tumor was 3+ progesterone receptor, 1+ estrogen receptor positive.  She was placed on tamoxifen 20 mg daily for 2 weeks followed by Megace 40 mg 3 times a day for 2 weeks. This was begun on 11/24/2015. At that time and baseline inhibin B was obtained (201) after 3 months of therapy the inhibin B had fallen to 66. Unfortunately, beginning in December 2017 her inhibin B began to rise and was 122 units in 09/30/16. At this juncture we discontinued tamoxifen and Megace.   Between Sep 30, 2016 and November 2019 the patient did not make herself  available for medical care and therefore received no treatment.   In mid November 2019 the patient presented with severe pain where tumor progression, (especially in the pelvis) was not clearly documented.  She was treated at the end of 09-30-17 with pelvic radiation, 45 Blye, and radiosensitizing chemotherapy.  Posttreatment CT scan showed significant decrease in metastatic disease and inhibin B was down to 17.  Her inhibin B ultimately normalized and she was off treatment until February 2022.  In December, her inhibin B increased.  CT scan in January of this year showed mixed interval changes with some metastatic disease slightly increased in size and other stable or decreased.  She was started on Aromasin in February.   Inhibin B trend: Component Ref Range & Units 1 mo ago  (03/22/21) 5 mo ago  (11/27/20) 7 mo ago  (10/06/20) 8 mo ago  (08/16/20) 11 mo ago  (06/12/20) 1 yr ago  (01/18/20) 1 yr ago  (09/14/19)  Inhibin B 0.0 - 16.9 pg/mL 78.8 High   29.4 High  CM  71.1 High  CM  12.1 CM  20.8 High  CM  <7.0 CM  8.8 CM    Most recently, Aromasin was discontinued after her last visit with Dr. Marin Olp.  She had been on this since January.  She notes tolerating it well although when to have an episode of feeling wiped out with associated nausea and diarrhea about every 2 weeks.  Currently, she continues to struggle with back and joint/muscle pain.  Most of this developed after her chemotherapy.  Her back pain is related to joint and spine pain.  She notes that this has been stable over the last 6 to 9 months.  It is acceptable with the use of tramadol and a muscle relaxer.  She endorses occasional pain in her mid abdomen.  When this happens, she will have abdominal bloating and then an episode of diarrhea.  She also notes some mild right-sided pain if she leans or turns to the left.  She endorses a good appetite with occasional nausea (sporadic, does not seem to be related to anything), denies emesis.   Reports normal bowel and bladder function.  She denies any vaginal bleeding or discharge.  Since her last visit with [place] in November 2019, she denies any new medical problems or surgeries.  Her mother, who was also diagnosed with ovarian cancer in September 30, 2012, passed away in Oct 01, 2019 after she had recurrence of her ovarian cancer.  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  PAST MEDICAL HISTORY:  Past Medical History:  Diagnosis Date   Arthritis    "everywhere"   Asthma    as a child   Automatic implantable cardioverter-defibrillator in situ    DR. Beckie Salts    Bladder cancer [facility]) 2007-10-01   "injected medicine to get rid of it"   Chronic systolic CHF (congestive heart failure) ([place])    CKD (chronic kidney disease), stage III ([place])  Coronary artery disease    a. large RV/inferior MI complicated by pseudoaneurysm s/p aneurysemectomy and CABG 10/2009 with papillary muscle rupture requiring bioprosthetic mitral valve replacement in [facility].   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  ICM and VT arrest 10/2009 s/p AICD (revision 01/2012 due to RV lead problem).   Myocardial infarction ([place]) 10/14/2009   Neuropathy    FEET AND HANDS - FROM CHEMO - BUT MUCH IMPROVED   PAF (paroxysmal atrial fibrillation) (HCC)    Pain    JOINT PAINS AND MUSCLE ACHES ALL OVER.   Port-A-Cath in place    RIGHT UPPER CHEST   Prosthetic valve dysfunction    S/P mitral valve replacement with bioprosthetic valve 10/21/2009   Edwards Perimount stented bovine pericardial tissue valve, size 29 mm   S/P valve-in-valve transcatheter mitral valve replacement 09/16/2017   29 mm Edwards Sapien 3 transcatheter heart valve placed via transapical approach   SVT  (supraventricular tachycardia) (HCC)    Tobacco abuse    Ventricular tachycardia      PAST SURGICAL HISTORY:  Past Surgical History:  Procedure Laterality Date   ABDOMINAL HYSTERECTOMY N/A 07/27/2013   Procedure: TUMOR EXCISION OF ABDOMINAL MASS;  Surgeon: Alvino Chapel, MD;  Location: [facility];  Service: Gynecology;  Laterality: N/A;   APPENDECTOMY  1989   BILATERAL OOPHORECTOMY  2007   CARDIAC DEFIBRILLATOR PLACEMENT  02/06/2012   "lead change"   CARDIAC VALVE REPLACEMENT     CYSTOSCOPY Right 02/23/2013   Procedure: CYSTOSCOPY WITH STENT REMOVAL;  Surgeon: Alvino Chapel, MD;  Location: [facility];  Service: Gynecology;  Laterality: Right;   ICD GENERATOR CHANGEOUT N/A 04/03/2020   Procedure: [place];  Surgeon: Evans Lance, MD;  Location: [place] [facility];  Service: Cardiovascular;  Laterality: N/A;   IMPLANTABLE CARDIOVERTER DEFIBRILLATOR REVISION N/A 02/06/2012   Procedure: IMPLANTABLE CARDIOVERTER DEFIBRILLATOR REVISION;  Surgeon: Evans Lance, MD;  Location: [facility] CATH LAB;  Service: Cardiovascular;  Laterality: N/A;   INSERT / REPLACE / REMOVE PACEMAKER  10/2009   DUAL-CHAMBER [place]. JUDE DEFIBRILLATOR   IR IMAGING GUIDED PORT INSERTION  05/11/2018   LAPAROTOMY N/A 01/19/2013   Procedure: TUMOR DEBULKING / BOWEL RESECTION/INSERTION RIGHT UTERERAL DOUBLE J STENT/OMENTECTOMY;  Surgeon: Alvino Chapel, MD;  Location: [facility];  Service: Gynecology;  Laterality: N/A;   MITRAL VALVE REPLACEMENT  10/21/2009   72mm Edwards Perimount bovine pericardial tissue valve - [place]   RIGHT/LEFT HEART CATH AND CORONARY ANGIOGRAPHY N/A 07/02/2017   Procedure: RIGHT/LEFT HEART CATH AND CORONARY ANGIOGRAPHY;  Surgeon: Belva Crome, MD;  Location: [place] [facility];  Service: Cardiovascular;  Laterality: N/A;   TEE WITHOUT CARDIOVERSION N/A 09/16/2017   Procedure: TRANSESOPHAGEAL ECHOCARDIOGRAM (TEE);  Surgeon: [name], MD;  Location: [place];  Service:  Open Heart Surgery;  Laterality: N/A;   THYMECTOMY  2009   TONSILLECTOMY AND ADENOIDECTOMY  ~ 1967   TOTAL HIP ARTHROPLASTY  05/06/2012   Procedure: TOTAL HIP ARTHROPLASTY;  Surgeon: Gearlean Alf, MD;  Location: [facility];  Service: Orthopedics;  Laterality: Right;   TRANSCATHETER MITRAL VALVE REPLACEMENT, TRANSAPICAL N/A 09/16/2017   Procedure: TRANSCATHETER MITRAL VALVE REPLACEMENT,TRANSAPICAL;  Surgeon: [name], MD;  Location: [place];  Service: Open Heart Surgery;  Laterality: N/A;   TRANSURETHRAL RESECTION OF BLADDER  2009   "for bladder cancer"   [place]  2001    OB/GYN HISTORY:  OB History  No obstetric history on file.    No LMP recorded. Patient has had a hysterectomy.  MEDICATIONS: Outpatient Encounter Medications as of 05/09/2021  Medication Sig   albuterol (VENTOLIN HFA) 108 (90 Base) MCG/ACT inhaler TAKE 2 PUFFS BY MOUTH EVERY 6 HOURS AS NEEDED FOR WHEEZE OR SHORTNESS OF BREATH   aspirin EC 81 MG tablet Take 81 mg by mouth daily.   b complex vitamins capsule Take 1 capsule by mouth 2 (two) times daily.   carvedilol (COREG) 3.125 MG tablet TAKE 1 TABLET (3.125 MG TOTAL) BY MOUTH 2 (TWO) TIMES DAILY WITH A MEAL.   cholecalciferol (VITAMIN D3) 25 MCG (1000 UNIT) tablet Take 4,000 Units by mouth daily.   furosemide (LASIX) 20 MG tablet TAKE 1 TABLET BY MOUTH EVERY DAY   lidocaine-prilocaine (EMLA) cream Apply 1 application topically as needed. Apply to port per instructions   loperamide (IMODIUM A-D) 2 MG tablet Take 4 mg by mouth 3 (three) times daily as needed for diarrhea or loose stools.   LORazepam (ATIVAN) 0.5 MG tablet TAKE 1 TABLET BY MOUTH EVERY 6 HRS AS NEEDED. FOR ANXIETY   methocarbamol (ROBAXIN) 500 MG tablet TAKE 1 TABLET BY MOUTH EVERY 6 HOURS AS NEEDED FOR MUSCLE SPASMS.   Potassium Chloride ER 20 MEQ TBCR Take 20 mEq by mouth daily.   spironolactone (ALDACTONE) 25 MG tablet TAKE 1 TABLET BY MOUTH EVERY DAY   Vitamin  D, Ergocalciferol, (DRISDOL) 1.25 MG (50000 UNIT) CAPS capsule TAKE 1 CAPSULE (50,000 UNITS TOTAL) BY MOUTH EVERY 7 (SEVEN) DAYS.   warfarin (COUMADIN) 7.5 MG tablet TAKE AS DIRECTED BY ANTICOAGULATION CLINIC   [DISCONTINUED] traMADol (ULTRAM) 50 MG tablet TAKE 1 TABLET BY MOUTH EVERY 6 HOURS AS NEEDED.   [DISCONTINUED] zolpidem (AMBIEN) 10 MG tablet TAKE 1 TABLET BY MOUTH EVERYDAY AT BEDTIME   amoxicillin (AMOXIL) 500 MG tablet Take 2,000 mg by mouth See admin instructions. Take 2,000 mg one hour prior to dental visits. (Patient not taking: No sig reported)   [DISCONTINUED] exemestane (AROMASIN) 25 MG tablet Take 1 tablet (25 mg total) by mouth daily after breakfast. (Patient not taking: Reported on 05/08/2021)   Facility-Administered Encounter Medications as of 05/09/2021  Medication   sodium chloride flush (NS) 0.9 % injection 10 mL    ALLERGIES:  Allergies  Allergen Reactions   Augmentin [Amoxicillin-Pot Clavulanate] Other (See Comments)    Muscle Aches   Prochlorperazine Edisylate Other (See Comments)    Nervous/ flutter/ shakes   Adhesive [Tape] Hives    Redness/hives from adhesive tape( tolerates latex gloves); tolerates paper tape     FAMILY HISTORY:  Family History  Problem Relation Age of Onset   Ovarian cancer Mother    Heart attack Brother    Stroke Maternal Grandmother    Heart attack Maternal Grandfather    Heart attack Paternal Grandfather      SOCIAL HISTORY:  Social Connections: Not on file    REVIEW OF SYSTEMS:  Pertinent positives as per HPI Denies appetite changes, fevers, chills, fatigue, unexplained weight changes. Denies hearing loss, neck lumps or masses, mouth sores, ringing in ears or voice changes. Denies cough or wheezing.  Denies shortness of breath. Denies chest pain or palpitations.  Denies leg swelling. Denies abdominal distention, pain, blood in stools, constipation, diarrhea, nausea, vomiting, or early satiety. Denies pain with intercourse,  dysuria, frequency, hematuria or incontinence. Denies hot flashes, pelvic pain, vaginal bleeding or vaginal discharge.   Denies itching, rash, or wounds. Denies dizziness, headaches, numbness or seizures. Denies swollen lymph nodes or glands, denies easy bruising or bleeding. Denies anxiety, depression, confusion, or decreased concentration.  Physical Exam:  Vital Signs for this encounter:  Blood pressure (!) 106/52, pulse 80, temperature 97.8 F (36.6 C), temperature source Tympanic, resp. rate 17, weight 175 lb 3 oz (79.5 kg), SpO2 98 %. Body mass index is 28.28 kg/m. General: Alert, oriented, no acute distress.  HEENT: Normocephalic, atraumatic. Sclera anicteric.  Chest: Clear to auscultation bilaterally. No wheezes, rhonchi, or rales. Cardiovascular: Regular rate and rhythm, no murmurs, rubs, or gallops.  Abdomen: Normoactive bowel sounds. Soft, nondistended, nontender to palpation. No masses or hepatosplenomegaly appreciated. No palpable fluid wave. Well healed incision. Extremities: Grossly normal range of motion. Warm, well perfused. No edema bilaterally.  Skin: No rashes or lesions.  Lymphatics: No cervical, supraclavicular, or inguinal adenopathy.  GU:  Normal external female genitalia.  No lesions. No discharge or bleeding.             Bladder/urethra:  No lesions or masses, well supported bladder            Speculum exam: atrophic vaginal mucosa, no lesions noted.  Bimanual exam: mass appreciated at vaginal apex along the cuff, 3-4 cm, mostly smooth.  Rectovaginal exam: mass better appreciated on RV exam, findings as above.  LABORATORY AND RADIOLOGIC DATA:  Outside medical records were reviewed to synthesize the above history, along with the history and physical obtained during the visit.   Lab Results  Component Value Date   WBC 7.6 03/22/2021   HGB 12.3 03/22/2021   HCT 35.6 (L) 03/22/2021   PLT 182 03/22/2021   GLUCOSE 83 03/22/2021   CHOL 247 (H) 09/18/2017   TRIG  144 09/18/2017   HDL 37 (L) 09/18/2017   LDLCALC 181 (H) 09/18/2017   ALT 7 03/22/2021   AST 12 (L) 03/22/2021   NA 138 03/22/2021   K 3.5 03/22/2021   CL 101 03/22/2021   CREATININE 1.16 (H) 03/22/2021   BUN 9 03/22/2021   CO2 28 03/22/2021   TSH 1.096 05/29/2015   INR 3.0 04/26/2021   HGBA1C 5.0 09/12/2017   CT C/A/P on 03/30/21: IMPRESSION: 1. Today's study demonstrates multiple peritoneal implants, many of which are stable to minimally increased compared to the prior examination, however, as noted on the prior examination, the large peritoneal implant in the low right pericolic gutter continues to increase in size, currently measuring 2.8 x 1.6 x 2.2 cm (previously 1.7 x 1.0 x 2.0 cm). No definite new implants are noted. No significant volume of ascites. 2. No signs of metastatic disease elsewhere in the chest, abdomen or pelvis. 3. Aortic atherosclerosis, in addition to 3 vessel coronary artery disease. Please note that although the presence of coronary artery calcium documents the presence of coronary artery disease, the severity of this disease and any potential stenosis cannot be assessed on this non-gated CT examination. Assessment for potential risk factor modification, dietary therapy or pharmacologic therapy may be warranted, if clinically indicated. 4. Aneurysmal dilatation of the left ventricular apex and large chronic partially calcified pseudoaneurysm of the inferior wall the left ventricle, similar to the prior examination, sequela of old myocardial infarction(s). 5. 7 mm ground-glass attenuation  nodule in the left upper lobe, stable compared to prior studies, nonspecific but likely benign given the stability, but continued attention on follow-up imaging is recommended. 6. Additional incidental findings, as above.

## 2021-05-09 NOTE — Patient Instructions (Signed)
It was a pleasure meeting you today.  I will send the notes from our discussion to Dr. Marin Olp.  After reviewing your images in the setting of your prior pelvic radiation, I would not recommend surgery at this time.  We discussed 3 possible options including treatment break for 3 months followed by repeating an inhibin B and depending on the level, repeating a CT scan.  We also discussed transitioning to a different oral hormonal medication or starting systemic therapy.  My recommendation for the latter would be the antiblood vessel formation medication called bevacizumab.

## 2021-05-13 ENCOUNTER — Other Ambulatory Visit: Payer: Self-pay | Admitting: Hematology & Oncology

## 2021-05-14 ENCOUNTER — Encounter: Payer: Self-pay | Admitting: Hematology & Oncology

## 2021-05-15 ENCOUNTER — Other Ambulatory Visit: Payer: Self-pay | Admitting: Hematology & Oncology

## 2021-05-15 DIAGNOSIS — F419 Anxiety disorder, unspecified: Secondary | ICD-10-CM

## 2021-06-07 ENCOUNTER — Other Ambulatory Visit: Payer: Self-pay

## 2021-06-07 ENCOUNTER — Other Ambulatory Visit: Payer: Self-pay | Admitting: Hematology & Oncology

## 2021-06-07 ENCOUNTER — Ambulatory Visit (INDEPENDENT_AMBULATORY_CARE_PROVIDER_SITE_OTHER): Payer: 59

## 2021-06-07 DIAGNOSIS — Z953 Presence of xenogenic heart valve: Secondary | ICD-10-CM | POA: Diagnosis not present

## 2021-06-07 DIAGNOSIS — Z952 Presence of prosthetic heart valve: Secondary | ICD-10-CM | POA: Diagnosis not present

## 2021-06-07 DIAGNOSIS — D3910 Neoplasm of uncertain behavior of unspecified ovary: Secondary | ICD-10-CM

## 2021-06-07 DIAGNOSIS — F419 Anxiety disorder, unspecified: Secondary | ICD-10-CM

## 2021-06-07 DIAGNOSIS — I480 Paroxysmal atrial fibrillation: Secondary | ICD-10-CM | POA: Diagnosis not present

## 2021-06-07 LAB — POCT INR: INR: 3 (ref 2.0–3.0)

## 2021-06-07 NOTE — Patient Instructions (Signed)
Description   DOES NOT NEED A PRINTED SHEET. Continue taking 1 tablet (7.5mg ) daily except 1/2 tablet (3.75mg ) on Mondays and Fridays. Recheck INR in 6 weeks. Continue normal dark green consistency. [facility] [phone] Main [phone]

## 2021-06-08 ENCOUNTER — Encounter: Payer: Self-pay | Admitting: Hematology & Oncology

## 2021-06-13 ENCOUNTER — Other Ambulatory Visit: Payer: Self-pay | Admitting: Hematology & Oncology

## 2021-06-13 DIAGNOSIS — D3910 Neoplasm of uncertain behavior of unspecified ovary: Secondary | ICD-10-CM

## 2021-06-25 ENCOUNTER — Other Ambulatory Visit: Payer: Self-pay | Admitting: Interventional Cardiology

## 2021-06-25 ENCOUNTER — Other Ambulatory Visit: Payer: Self-pay | Admitting: Hematology & Oncology

## 2021-06-25 DIAGNOSIS — I5022 Chronic systolic (congestive) heart failure: Secondary | ICD-10-CM

## 2021-06-26 ENCOUNTER — Encounter: Payer: Self-pay | Admitting: Hematology & Oncology

## 2021-06-28 ENCOUNTER — Other Ambulatory Visit: Payer: Self-pay | Admitting: Interventional Cardiology

## 2021-06-28 DIAGNOSIS — I5022 Chronic systolic (congestive) heart failure: Secondary | ICD-10-CM

## 2021-07-02 ENCOUNTER — Other Ambulatory Visit: Payer: Self-pay | Admitting: Hematology & Oncology

## 2021-07-02 DIAGNOSIS — F419 Anxiety disorder, unspecified: Secondary | ICD-10-CM

## 2021-07-03 ENCOUNTER — Ambulatory Visit (INDEPENDENT_AMBULATORY_CARE_PROVIDER_SITE_OTHER): Payer: 59

## 2021-07-03 ENCOUNTER — Other Ambulatory Visit: Payer: Self-pay | Admitting: *Deleted

## 2021-07-03 ENCOUNTER — Encounter: Payer: Self-pay | Admitting: Hematology & Oncology

## 2021-07-03 DIAGNOSIS — I4729 Other ventricular tachycardia: Secondary | ICD-10-CM

## 2021-07-03 DIAGNOSIS — F419 Anxiety disorder, unspecified: Secondary | ICD-10-CM

## 2021-07-03 LAB — CUP PACEART REMOTE DEVICE CHECK
Battery Remaining Longevity: 90 mo
Battery Remaining Percentage: 87 %
Battery Voltage: 3.01 V
Brady Statistic AP VP Percent: 1 %
Brady Statistic AP VS Percent: 1 %
Brady Statistic AS VP Percent: 1 %
Brady Statistic AS VS Percent: 99 %
Brady Statistic RA Percent Paced: 1 %
Brady Statistic RV Percent Paced: 1 %
Date Time Interrogation Session: 20230110022711
HighPow Impedance: 56 Ohm
Implantable Lead Implant Date: 20110503
Implantable Lead Implant Date: 20130815
Implantable Lead Location: 753859
Implantable Lead Location: 753860
Implantable Pulse Generator Implant Date: 20211012
Lead Channel Impedance Value: 260 Ohm
Lead Channel Impedance Value: 360 Ohm
Lead Channel Pacing Threshold Amplitude: 0.25 V
Lead Channel Pacing Threshold Amplitude: 2.25 V
Lead Channel Pacing Threshold Pulse Width: 0.5 ms
Lead Channel Pacing Threshold Pulse Width: 1 ms
Lead Channel Sensing Intrinsic Amplitude: 1.6 mV
Lead Channel Sensing Intrinsic Amplitude: 11.9 mV
Lead Channel Setting Pacing Amplitude: 2 V
Lead Channel Setting Pacing Amplitude: 2.5 V
Lead Channel Setting Pacing Pulse Width: 1 ms
Lead Channel Setting Sensing Sensitivity: 0.5 mV
Pulse Gen Serial Number: 111028151

## 2021-07-03 MED ORDER — LORAZEPAM 0.5 MG PO TABS: ORAL_TABLET | ORAL | 0 refills | Status: DC

## 2021-07-06 ENCOUNTER — Other Ambulatory Visit: Payer: Self-pay

## 2021-07-06 DIAGNOSIS — D3910 Neoplasm of uncertain behavior of unspecified ovary: Secondary | ICD-10-CM

## 2021-07-06 MED ORDER — ZOLPIDEM TARTRATE 10 MG PO TABS: ORAL_TABLET | ORAL | 0 refills | Status: DC

## 2021-07-12 NOTE — Progress Notes (Signed)
Remote ICD transmission.   

## 2021-07-19 ENCOUNTER — Other Ambulatory Visit: Payer: Self-pay | Admitting: Hematology & Oncology

## 2021-07-19 DIAGNOSIS — D3910 Neoplasm of uncertain behavior of unspecified ovary: Secondary | ICD-10-CM

## 2021-07-20 ENCOUNTER — Ambulatory Visit (INDEPENDENT_AMBULATORY_CARE_PROVIDER_SITE_OTHER): Payer: 59

## 2021-07-20 ENCOUNTER — Other Ambulatory Visit: Payer: Self-pay

## 2021-07-20 DIAGNOSIS — I480 Paroxysmal atrial fibrillation: Secondary | ICD-10-CM | POA: Diagnosis not present

## 2021-07-20 DIAGNOSIS — Z952 Presence of prosthetic heart valve: Secondary | ICD-10-CM | POA: Diagnosis not present

## 2021-07-20 DIAGNOSIS — Z953 Presence of xenogenic heart valve: Secondary | ICD-10-CM | POA: Diagnosis not present

## 2021-07-20 DIAGNOSIS — Z5181 Encounter for therapeutic drug level monitoring: Secondary | ICD-10-CM | POA: Diagnosis not present

## 2021-07-20 LAB — POCT INR: INR: 2.9 (ref 2.0–3.0)

## 2021-07-20 NOTE — Patient Instructions (Signed)
Description   DOES NOT NEED A PRINTED SHEET. Continue taking 1 tablet (7.5mg ) daily except 1/2 tablet (3.75mg ) on Mondays and Fridays. Recheck INR in 6 weeks. Continue normal dark green consistency. [facility] [phone] Main [phone]

## 2021-07-25 ENCOUNTER — Other Ambulatory Visit: Payer: Self-pay | Admitting: *Deleted

## 2021-07-25 DIAGNOSIS — F419 Anxiety disorder, unspecified: Secondary | ICD-10-CM

## 2021-07-25 MED ORDER — LORAZEPAM 0.5 MG PO TABS: ORAL_TABLET | ORAL | 0 refills | Status: DC

## 2021-07-27 ENCOUNTER — Telehealth: Payer: Self-pay | Admitting: Hematology & Oncology

## 2021-07-27 NOTE — Telephone Encounter (Signed)
Called to schedule per 07/26/21 sch msg, left voicemail

## 2021-07-29 ENCOUNTER — Other Ambulatory Visit: Payer: Self-pay | Admitting: Interventional Cardiology

## 2021-07-29 DIAGNOSIS — D3910 Neoplasm of uncertain behavior of unspecified ovary: Secondary | ICD-10-CM

## 2021-08-01 ENCOUNTER — Other Ambulatory Visit: Payer: Self-pay | Admitting: Hematology & Oncology

## 2021-08-06 ENCOUNTER — Other Ambulatory Visit: Payer: Self-pay | Admitting: Hematology & Oncology

## 2021-08-06 DIAGNOSIS — D3910 Neoplasm of uncertain behavior of unspecified ovary: Secondary | ICD-10-CM

## 2021-08-14 ENCOUNTER — Inpatient Hospital Stay: Payer: 59

## 2021-08-14 ENCOUNTER — Inpatient Hospital Stay: Payer: 59 | Admitting: Hematology & Oncology

## 2021-08-16 ENCOUNTER — Other Ambulatory Visit: Payer: Self-pay | Admitting: Hematology & Oncology

## 2021-08-16 DIAGNOSIS — F419 Anxiety disorder, unspecified: Secondary | ICD-10-CM

## 2021-08-23 ENCOUNTER — Other Ambulatory Visit: Payer: Self-pay | Admitting: Interventional Cardiology

## 2021-08-23 ENCOUNTER — Other Ambulatory Visit: Payer: Self-pay | Admitting: Hematology & Oncology

## 2021-08-23 DIAGNOSIS — I5022 Chronic systolic (congestive) heart failure: Secondary | ICD-10-CM

## 2021-08-23 DIAGNOSIS — D3910 Neoplasm of uncertain behavior of unspecified ovary: Secondary | ICD-10-CM

## 2021-08-29 ENCOUNTER — Other Ambulatory Visit: Payer: Self-pay

## 2021-08-29 DIAGNOSIS — D3910 Neoplasm of uncertain behavior of unspecified ovary: Secondary | ICD-10-CM

## 2021-08-30 ENCOUNTER — Inpatient Hospital Stay (HOSPITAL_BASED_OUTPATIENT_CLINIC_OR_DEPARTMENT_OTHER): Payer: 59 | Admitting: Hematology & Oncology

## 2021-08-30 ENCOUNTER — Inpatient Hospital Stay: Payer: 59

## 2021-08-30 ENCOUNTER — Encounter: Payer: Self-pay | Admitting: Hematology & Oncology

## 2021-08-30 ENCOUNTER — Other Ambulatory Visit: Payer: Self-pay

## 2021-08-30 ENCOUNTER — Inpatient Hospital Stay: Payer: 59 | Attending: Hematology & Oncology

## 2021-08-30 VITALS — BP 114/73 | HR 73 | Temp 98.0°F | Resp 17 | Ht 66.0 in | Wt 179.0 lb

## 2021-08-30 DIAGNOSIS — D3910 Neoplasm of uncertain behavior of unspecified ovary: Secondary | ICD-10-CM | POA: Diagnosis not present

## 2021-08-30 DIAGNOSIS — I5022 Chronic systolic (congestive) heart failure: Secondary | ICD-10-CM | POA: Diagnosis not present

## 2021-08-30 DIAGNOSIS — C569 Malignant neoplasm of unspecified ovary: Secondary | ICD-10-CM | POA: Insufficient documentation

## 2021-08-30 DIAGNOSIS — I2510 Atherosclerotic heart disease of native coronary artery without angina pectoris: Secondary | ICD-10-CM | POA: Insufficient documentation

## 2021-08-30 DIAGNOSIS — I252 Old myocardial infarction: Secondary | ICD-10-CM | POA: Diagnosis not present

## 2021-08-30 DIAGNOSIS — Z79811 Long term (current) use of aromatase inhibitors: Secondary | ICD-10-CM | POA: Insufficient documentation

## 2021-08-30 DIAGNOSIS — N183 Chronic kidney disease, stage 3 unspecified: Secondary | ICD-10-CM | POA: Diagnosis not present

## 2021-08-30 DIAGNOSIS — Z923 Personal history of irradiation: Secondary | ICD-10-CM | POA: Diagnosis not present

## 2021-08-30 DIAGNOSIS — D508 Other iron deficiency anemias: Secondary | ICD-10-CM | POA: Diagnosis not present

## 2021-08-30 DIAGNOSIS — E039 Hypothyroidism, unspecified: Secondary | ICD-10-CM | POA: Insufficient documentation

## 2021-08-30 DIAGNOSIS — I480 Paroxysmal atrial fibrillation: Secondary | ICD-10-CM | POA: Insufficient documentation

## 2021-08-30 DIAGNOSIS — I255 Ischemic cardiomyopathy: Secondary | ICD-10-CM | POA: Diagnosis not present

## 2021-08-30 DIAGNOSIS — Z452 Encounter for adjustment and management of vascular access device: Secondary | ICD-10-CM | POA: Insufficient documentation

## 2021-08-30 DIAGNOSIS — Z95810 Presence of automatic (implantable) cardiac defibrillator: Secondary | ICD-10-CM | POA: Diagnosis not present

## 2021-08-30 DIAGNOSIS — E7800 Pure hypercholesterolemia, unspecified: Secondary | ICD-10-CM | POA: Diagnosis not present

## 2021-08-30 DIAGNOSIS — Z952 Presence of prosthetic heart valve: Secondary | ICD-10-CM | POA: Diagnosis not present

## 2021-08-30 DIAGNOSIS — Z7901 Long term (current) use of anticoagulants: Secondary | ICD-10-CM | POA: Diagnosis not present

## 2021-08-30 DIAGNOSIS — K909 Intestinal malabsorption, unspecified: Secondary | ICD-10-CM | POA: Insufficient documentation

## 2021-08-30 DIAGNOSIS — Z9221 Personal history of antineoplastic chemotherapy: Secondary | ICD-10-CM | POA: Insufficient documentation

## 2021-08-30 DIAGNOSIS — Z8674 Personal history of sudden cardiac arrest: Secondary | ICD-10-CM | POA: Diagnosis not present

## 2021-08-30 DIAGNOSIS — I471 Supraventricular tachycardia: Secondary | ICD-10-CM | POA: Diagnosis not present

## 2021-08-30 DIAGNOSIS — Z95828 Presence of other vascular implants and grafts: Secondary | ICD-10-CM

## 2021-08-30 DIAGNOSIS — Z79899 Other long term (current) drug therapy: Secondary | ICD-10-CM | POA: Diagnosis not present

## 2021-08-30 LAB — CMP (CANCER CENTER ONLY)
ALT: 12 U/L (ref 0–44)
AST: 20 U/L (ref 15–41)
Albumin: 3.7 g/dL (ref 3.5–5.0)
Alkaline Phosphatase: 37 U/L — ABNORMAL LOW (ref 38–126)
Anion gap: 10 (ref 5–15)
BUN: 16 mg/dL (ref 8–23)
CO2: 27 mmol/L (ref 22–32)
Calcium: 8.7 mg/dL — ABNORMAL LOW (ref 8.9–10.3)
Chloride: 100 mmol/L (ref 98–111)
Creatinine: 1.14 mg/dL — ABNORMAL HIGH (ref 0.44–1.00)
GFR, Estimated: 55 mL/min — ABNORMAL LOW (ref >60)
Glucose, Bld: 85 mg/dL (ref 70–99)
Potassium: 3.4 mmol/L — ABNORMAL LOW (ref 3.5–5.1)
Sodium: 137 mmol/L (ref 135–145)
Total Bilirubin: 0.5 mg/dL (ref 0.3–1.2)
Total Protein: 6.6 g/dL (ref 6.5–8.1)

## 2021-08-30 LAB — CBC WITH DIFFERENTIAL (CANCER CENTER ONLY)
Abs Immature Granulocytes: 0.02 10*3/uL (ref 0.00–0.07)
Basophils Absolute: 0 10*3/uL (ref 0.0–0.1)
Basophils Relative: 1 %
Eosinophils Absolute: 0.1 10*3/uL (ref 0.0–0.5)
Eosinophils Relative: 2 %
HCT: 32.9 % — ABNORMAL LOW (ref 36.0–46.0)
Hemoglobin: 11.5 g/dL — ABNORMAL LOW (ref 12.0–15.0)
Immature Granulocytes: 0 %
Lymphocytes Relative: 10 %
Lymphs Abs: 0.8 10*3/uL (ref 0.7–4.0)
MCH: 32 pg (ref 26.0–34.0)
MCHC: 35 g/dL (ref 30.0–36.0)
MCV: 91.6 fL (ref 80.0–100.0)
Monocytes Absolute: 0.7 10*3/uL (ref 0.1–1.0)
Monocytes Relative: 9 %
Neutro Abs: 6.1 10*3/uL (ref 1.7–7.7)
Neutrophils Relative %: 78 %
Platelet Count: 155 10*3/uL (ref 150–400)
RBC: 3.59 MIL/uL — ABNORMAL LOW (ref 3.87–5.11)
RDW: 13 % (ref 11.5–15.5)
WBC Count: 7.9 10*3/uL (ref 4.0–10.5)
nRBC: 0 % (ref 0.0–0.2)

## 2021-08-30 LAB — LACTATE DEHYDROGENASE: LDH: 153 U/L (ref 98–192)

## 2021-08-30 MED ORDER — SODIUM CHLORIDE 0.9% FLUSH
10.0000 mL | Freq: Once | INTRAVENOUS | Status: AC
  Administered 2021-08-30: 15:00:00 10 mL via INTRAVENOUS

## 2021-08-30 MED ORDER — HEPARIN SOD (PORK) LOCK FLUSH 100 UNIT/ML IV SOLN
500.0000 [IU] | Freq: Once | INTRAVENOUS | Status: AC
  Administered 2021-08-30: 15:00:00 500 [IU] via INTRAVENOUS

## 2021-08-31 ENCOUNTER — Ambulatory Visit (INDEPENDENT_AMBULATORY_CARE_PROVIDER_SITE_OTHER): Payer: 59 | Admitting: Physician Assistant

## 2021-08-31 ENCOUNTER — Telehealth: Payer: Self-pay

## 2021-08-31 ENCOUNTER — Encounter: Payer: Self-pay | Admitting: Hematology & Oncology

## 2021-08-31 ENCOUNTER — Encounter: Payer: Self-pay | Admitting: Physician Assistant

## 2021-08-31 ENCOUNTER — Ambulatory Visit (INDEPENDENT_AMBULATORY_CARE_PROVIDER_SITE_OTHER): Payer: 59

## 2021-08-31 ENCOUNTER — Telehealth: Payer: Self-pay | Admitting: Hematology & Oncology

## 2021-08-31 VITALS — BP 110/64 | HR 78 | Ht 66.0 in | Wt 177.0 lb

## 2021-08-31 DIAGNOSIS — Z95810 Presence of automatic (implantable) cardiac defibrillator: Secondary | ICD-10-CM | POA: Diagnosis not present

## 2021-08-31 DIAGNOSIS — Z953 Presence of xenogenic heart valve: Secondary | ICD-10-CM | POA: Diagnosis not present

## 2021-08-31 DIAGNOSIS — I255 Ischemic cardiomyopathy: Secondary | ICD-10-CM | POA: Diagnosis not present

## 2021-08-31 DIAGNOSIS — I480 Paroxysmal atrial fibrillation: Secondary | ICD-10-CM | POA: Diagnosis not present

## 2021-08-31 DIAGNOSIS — I5022 Chronic systolic (congestive) heart failure: Secondary | ICD-10-CM

## 2021-08-31 DIAGNOSIS — Z952 Presence of prosthetic heart valve: Secondary | ICD-10-CM | POA: Diagnosis not present

## 2021-08-31 DIAGNOSIS — Z9889 Other specified postprocedural states: Secondary | ICD-10-CM

## 2021-08-31 LAB — CUP PACEART INCLINIC DEVICE CHECK
Battery Remaining Longevity: 100 mo
Brady Statistic RA Percent Paced: 0 %
Brady Statistic RV Percent Paced: 0.03 %
Date Time Interrogation Session: 20230310115850
HighPow Impedance: 68.625
Implantable Lead Implant Date: 20110503
Implantable Lead Implant Date: 20130815
Implantable Lead Location: 753859
Implantable Lead Location: 753860
Implantable Pulse Generator Implant Date: 20211012
Lead Channel Impedance Value: 262.5 Ohm
Lead Channel Impedance Value: 400 Ohm
Lead Channel Pacing Threshold Amplitude: 0.5 V
Lead Channel Pacing Threshold Amplitude: 0.5 V
Lead Channel Pacing Threshold Amplitude: 1.875 V
Lead Channel Pacing Threshold Pulse Width: 0.5 ms
Lead Channel Pacing Threshold Pulse Width: 0.5 ms
Lead Channel Pacing Threshold Pulse Width: 1 ms
Lead Channel Sensing Intrinsic Amplitude: 11.9 mV
Lead Channel Sensing Intrinsic Amplitude: 2.1 mV
Lead Channel Setting Pacing Amplitude: 2 V
Lead Channel Setting Pacing Amplitude: 2.125
Lead Channel Setting Pacing Pulse Width: 1 ms
Lead Channel Setting Sensing Sensitivity: 0.5 mV
Pulse Gen Serial Number: 111028151

## 2021-08-31 LAB — IRON AND IRON BINDING CAPACITY (CC-WL,HP ONLY)
Iron: 69 ug/dL (ref 28–170)
Saturation Ratios: 19 % (ref 10.4–31.8)
TIBC: 356 ug/dL (ref 250–450)
UIBC: 287 ug/dL (ref 148–442)

## 2021-08-31 LAB — INHIBIN B: Inhibin B: 26.3 pg/mL — ABNORMAL HIGH (ref 0.0–16.9)

## 2021-08-31 LAB — POCT INR: INR: 3 (ref 2.0–3.0)

## 2021-08-31 LAB — FERRITIN: Ferritin: 39 ng/mL (ref 11–307)

## 2021-08-31 NOTE — Telephone Encounter (Signed)
Attempted to call patient but no answer so advised pt via MyChart. ?

## 2021-08-31 NOTE — Telephone Encounter (Signed)
-----   Message from Volanda Napoleon, MD sent at 08/31/2021 12:25 PM EST ----- ?Please call and let him know that the iron is little bit on the lower side.  Her iron saturation is only 19%.  We probably need to give her 1 dose of IV iron.  Please set this up.  Thanks.  Pete ?

## 2021-08-31 NOTE — Progress Notes (Signed)
Cardiology Office Note Date:  08/31/2021  Patient ID:  [name], [name] 07-Apr-1961, MRN [id number] PCP:  Volanda Napoleon, MD  Cardiologist:  Dr. Tamala Julian Structural: Dr. Burt Knack Electrophysiologist: Dr. Lovena Le    Chief Complaint:  annual visit  History of Present Illness: [name] is a 61 y.o. female with history of recurrent ovarian Ca (multiple intra-abdominal metastases treated with prior surgical resection in 2014 and 2015 with further slow progression), hypothyroidism, CKD (III), CAD, s/p inferior MI in 2025 complicated by postinfarction papillary muscle rupture and left ventricular aneurysm who underwent urgent repair with mitral valve replacement using a bioprosthetic tissue valve and repair of the left ventricular aneurysm, CABG),  prosthetic mitral valve stenosis s/p TMVR (08/2017), ICM, VT, ICD, chronic CHF (systolic), AFib   At the time of gen change  Dr. Lovena Le, discussed known A lead malfunction with low pacing impedence but p waves and the atrial threshold are still good, since she does not pace much, he recommended not removing or addind a new atrial lead.  She comes in today to be seen for Dr. Lovena Le, last seen by him at the time of her gen change Oct 2021.  TODAY All in all doing OK Anxiously awaiting nes back from oncology on how her numbers looked yesterday. Cardiac-wise, no complaints No CP, palpitations or cardiac awareness. No SOB No dizzy spells, near syncope or syncope. No shocks No bleeding Sees the coumadin clinic today   Device information SJM dual chamber ICD, implanted RA lead 2011, RV  Lead 2013, gen change 04/03/20 A lead with known low impedance  Past Medical History:  Diagnosis Date   Arthritis    "everywhere"   Asthma    as a child   Automatic implantable cardioverter-defibrillator in situ    DR. Beckie Salts    Bladder cancer [facility]) 2009   "injected medicine to get rid of it"   Chronic systolic CHF (congestive heart failure) ([place])     CKD (chronic kidney disease), stage III ([place])    Coronary artery disease    a. large RV/inferior MI complicated by pseudoaneurysm s/p aneurysemectomy and CABG 10/2009 with papillary muscle rupture requiring bioprosthetic mitral valve replacement in [facility].   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  ICM and VT arrest 10/2009 s/p AICD (revision 01/2012 due to RV lead problem).   Myocardial infarction ([place]) 10/14/2009   Neuropathy    FEET AND HANDS - FROM CHEMO - BUT MUCH IMPROVED   PAF (paroxysmal atrial fibrillation) (HCC)    Pain    JOINT PAINS AND MUSCLE ACHES ALL OVER.   Port-A-Cath in place    RIGHT UPPER CHEST   Prosthetic valve dysfunction    S/P mitral valve replacement with bioprosthetic valve 10/21/2009   Edwards Perimount stented bovine pericardial tissue valve, size 29 mm   S/P valve-in-valve transcatheter mitral valve replacement 09/16/2017   29 mm Edwards Sapien 3 transcatheter heart valve placed via transapical approach   SVT (supraventricular tachycardia) (HCC)    Tobacco abuse    Ventricular tachycardia     Past Surgical History:  Procedure Laterality Date   ABDOMINAL HYSTERECTOMY N/A 07/27/2013   Procedure: TUMOR EXCISION OF  ABDOMINAL MASS;  Surgeon: Alvino Chapel, MD;  Location: [facility];  Service: Gynecology;  Laterality: N/A;   APPENDECTOMY  1989   BILATERAL OOPHORECTOMY  2007   CARDIAC DEFIBRILLATOR PLACEMENT  02/06/2012   "lead change"   CARDIAC VALVE REPLACEMENT     CYSTOSCOPY Right 02/23/2013   Procedure: CYSTOSCOPY WITH STENT REMOVAL;  Surgeon: Alvino Chapel, MD;  Location: [facility];  Service: Gynecology;  Laterality: Right;   ICD GENERATOR CHANGEOUT N/A 04/03/2020   Procedure: [place];  Surgeon: Evans Lance, MD;  Location: [place] CV LAB;  Service: Cardiovascular;  Laterality: N/A;   IMPLANTABLE CARDIOVERTER DEFIBRILLATOR REVISION N/A 02/06/2012   Procedure: IMPLANTABLE CARDIOVERTER DEFIBRILLATOR REVISION;  Surgeon: Evans Lance, MD;  Location: [facility] CATH LAB;  Service: Cardiovascular;  Laterality: N/A;   INSERT / REPLACE / REMOVE PACEMAKER  10/2009   DUAL-CHAMBER [place]. JUDE DEFIBRILLATOR   IR IMAGING GUIDED PORT INSERTION  05/11/2018   LAPAROTOMY N/A 01/19/2013   Procedure: TUMOR DEBULKING / BOWEL RESECTION/INSERTION RIGHT UTERERAL DOUBLE J STENT/OMENTECTOMY;  Surgeon: Alvino Chapel, MD;  [facility];  Service: Gynecology;  Laterality: N/A;   MITRAL VALVE REPLACEMENT  10/21/2009   54m Edwards Perimount bovine pericardial tissue valve - [place]   RIGHT/LEFT HEART CATH AND CORONARY ANGIOGRAPHY N/A 07/02/2017   Procedure: RIGHT/LEFT HEART CATH AND CORONARY ANGIOGRAPHY;  Surgeon: SBelva Crome MD;  [place] [facility];  Service: Cardiovascular;  Laterality: N/A;   TEE WITHOUT CARDIOVERSION N/A 09/16/2017   Procedure: TRANSESOPHAGEAL ECHOCARDIOGRAM (TEE);  Surgeon: CSherren Mocha MD;  [place]  Service: Open Heart Surgery;  Laterality: N/A;   THYMECTOMY  2009   TONSILLECTOMY AND ADENOIDECTOMY  ~ 1967   TOTAL HIP ARTHROPLASTY  05/06/2012   Procedure: TOTAL HIP ARTHROPLASTY;  Surgeon: FGearlean Alf MD;  [facility];  Service: Orthopedics;  Laterality: Right;   TRANSCATHETER MITRAL VALVE REPLACEMENT, TRANSAPICAL N/A 09/16/2017   Procedure: TRANSCATHETER MITRAL VALVE REPLACEMENT,TRANSAPICAL;  Surgeon: CSherren Mocha MD;  Location: [place]  Service: Open Heart Surgery;  Laterality: N/A;   TRANSURETHRAL RESECTION OF BLADDER  2009   "for bladder cancer"   [place]   Current Outpatient Medications  Medication Sig Dispense Refill   albuterol (VENTOLIN HFA) 108 (90 Base)  MCG/ACT inhaler TAKE 2 PUFFS BY MOUTH EVERY 6 HOURS AS NEEDED FOR WHEEZE OR SHORTNESS OF BREATH 6.7 each 1   amoxicillin (AMOXIL) 500 MG tablet Take 2,000 mg by mouth See admin instructions. Take 2,000 mg one hour prior to dental visits. (Patient not taking: Reported on 03/22/2021)     aspirin EC 81 MG tablet Take 81 mg by mouth daily.     b complex vitamins capsule Take 1 capsule by mouth 2 (two) times daily.     carvedilol (COREG) 3.125 MG tablet TAKE 1 TABLET (3.125 MG TOTAL) BY MOUTH 2 (TWO) TIMES DAILY WITH A MEAL. 180 tablet 2   cholecalciferol (VITAMIN D3) 25 MCG (1000 UNIT) tablet Take 4,000 Units by mouth daily.     furosemide (LASIX) 20 MG tablet TAKE 1 TABLET BY MOUTH EVERY DAY 90 tablet 0   lidocaine-prilocaine (EMLA) cream Apply 1 application topically as needed. Apply to port per instructions 30 g 12   loperamide (IMODIUM A-D) 2 MG tablet Take 4 mg by mouth 3 (three) times daily as needed for diarrhea or loose stools.     LORazepam (ATIVAN) 0.5  MG tablet TAKE ONE TABLET BY MOUTH EVERY 8 HOURS 60 tablet 0   methocarbamol (ROBAXIN) 500 MG tablet TAKE ONE TABLET BY MOUTH EVERY 6 HOURS AS NEEDED FOR MUSCLE SPASMS 60 tablet 2   Potassium Chloride ER 20 MEQ TBCR Take 20 mEq by mouth daily. 90 tablet 3   spironolactone (ALDACTONE) 25 MG tablet TAKE 1 TABLET BY MOUTH EVERY DAY 90 tablet 0   traMADol (ULTRAM) 50 MG tablet TAKE ONE TABLET BY MOUTH EVERY 6 HOURS AS NEEDED 90 tablet 0   Vitamin D, Ergocalciferol, (DRISDOL) 1.25 MG (50000 UNIT) CAPS capsule TAKE 1 CAPSULE (50,000 UNITS TOTAL) BY MOUTH EVERY 7 (SEVEN) DAYS. 12 capsule 3   warfarin (COUMADIN) 7.5 MG tablet TAKE AS DIRECTED BY ANTICOAGULATION CLINIC 90 tablet 1   zolpidem (AMBIEN) 10 MG tablet TAKE ONE TABLET BY MOUTH EVERY DAY AT bedtime 30 tablet 0   No current facility-administered medications for this visit.   Facility-Administered Medications Ordered in Other Visits  Medication Dose Route Frequency Provider Last Rate Last  Admin   sodium chloride flush (NS) 0.9 % injection 10 mL  10 mL Intravenous PRN Volanda Napoleon, MD   10 mL at 09/14/18 1150    Allergies:   Augmentin [amoxicillin-pot clavulanate], Prochlorperazine edisylate, and Adhesive [tape]   Social History:  The patient  reports that she has been smoking cigarettes. She started smoking about 47 years ago. She has a 15.00 pack-year smoking history. She has never used smokeless tobacco. She reports that she does not drink alcohol and does not use drugs.   Family History:  The patient's family history includes Heart attack in her brother, maternal grandfather, and paternal grandfather; Ovarian cancer in her mother; Stroke in her maternal grandmother.  ROS:  Please see the history of present illness.    All other systems are reviewed and otherwise negative.   PHYSICAL EXAM:  VS:  There were no vitals taken for this visit. BMI: There is no height or weight on file to calculate BMI. Well nourished, well developed, in no acute distress HEENT: normocephalic, atraumatic Neck: no JVD, carotid bruits or masses Cardiac:  RRR; no significant murmurs, no rubs, or gallops Lungs:  CTA b/l, no wheezing, rhonchi or rales Abd: soft, nontender MS: no deformity or atrophy Ext:  no edema Skin: warm and dry, no rash Neuro:  No gross deficits appreciated Psych: euthymic mood, full affect   ICD site: no skin changes, tethering or discomfort   EKG:  done today and reviewed by myself SR 78bpm, IVCD, Q waves Inf/lat unchanged  Device interrogation done today and reviewed by myself:  Battery and lead measurements are good 2 AMS 10 and 14 seconds Burden <1% No VT VP <1%   08/27/2018: TTE IMPRESSIONS   1. The left ventricle has mild-moderately reduced systolic function, with  an ejection fraction of 40-45%. The cavity size was mildly dilated. Left  ventricular diastolic Doppler parameters are indeterminate.   2. Septal hypokinesis inferior basal aneurysm.   3.  The right ventricle has normal systolic function. The cavity was  normal. There is no increase in right ventricular wall thickness.   4. Pacing wires in RA/RV.   5. Left atrial size was moderately dilated.   6. Post bioprosthetic MVR and subsequent valve in vlave replacement No  significant MR and stable mean gradient 6 mmHg.   7. The tricuspid valve is normal in structure.   8. The aortic valve is tricuspid Moderate thickening of the aortic valve  Moderate calcification of the aortic valve.   9. The pulmonic valve was grossly normal. Pulmonic valve regurgitation is  mild by color flow Doppler.  10. The aortic root is normal in size     07/02/2017: LHC Severe mitral prosthesis stenosis with calculated valve area of 1.02 cm square.  This is based upon a mean gradient across the mitral valve of 18.8 mmHg.  No mitral regurgitation is noted by left ventriculography. Mild pulmonary hypertension with mean pressure 30 mmHg (42/19 mmHg). Inferobasal dyskinesis with akinesis of the mid and apical inferior wall.  EF 30-35%.  Normal anterior wall motion.  Normal LVEDP. Widely patent left main, LAD, and circumflex coronary artery.  The second obtuse marginal contains eccentric proximal 50% narrowing. Dominant right coronary with mid vessel 80% segmental stenosis with appearance of dissection representing recanalized total occlusion from the culprit event in 2011.  The patient is not having angina and the inferior wall is akinetic.  No indication for PCI in absence of ischemic symptoms.   RECOMMENDATIONS:   Continue medical therapy for LV systolic dysfunction Refer for surgical opinion concerning valve replacement.  Balloon valvotomy in the setting of bioprosthetic valve is not an option due to high risk of valve leaflet tearing and severe mitral regurgitation.      Recent Labs: 08/30/2021: ALT 12; BUN 16; Creatinine 1.14; Hemoglobin 11.5; Platelet Count 155; Potassium 3.4; Sodium 137  No results  found for requested labs within last 8760 hours.   Estimated Creatinine Clearance: 55.7 mL/min (A) (by C-G formula based on SCr of 1.14 mg/dL (H)).   Wt Readings from Last 3 Encounters:  08/30/21 179 lb (81.2 kg)  05/09/21 175 lb 3 oz (79.5 kg)  03/22/21 173 lb (78.5 kg)     Other studies reviewed: Additional studies/records reviewed today include: summarized above  ASSESSMENT AND PLAN:  1. ICD     stable function, no programming changes made      2. CAD     On ASA, BB     C/w Dr. Tamala Julian, sees the team next week  3. ICM 4. Chronic CHF     No symptoms or exam findings to suggest volume OL    On BB, diuretic tx     Mildly hpokalemic yesterday, she missed her K+ tab intermittently, advised to take as prescribed  5. Paroxysmal AFib     CHA2DS2Vasc is 3, on warfarin     <1 % burden     6. VHD     S/p bioprosthetcic MVR >> stenosis >> [facility]     Sees cardiology team next week     No significant murmur on exam  7. NSVT     None noted   Disposition: continue remotes as usual, in clinic in a year with EP, sooner if needed   Current medicines are reviewed at length with the patient today.  The patient did not have any concerns regarding medicines.  Venetia Night, PA-C 08/31/2021 4:51 AM     [facility] [place] [address] [phone] (office)  [phone] (fax)

## 2021-08-31 NOTE — Patient Instructions (Signed)
Medication Instructions:  ? ?Your physician recommends that you continue on your current medications as directed. Please refer to the Current Medication list given to you today. ? ? ?*If you need a refill on your cardiac medications before your next appointment, please call your pharmacy* ? ? ?Lab Work: [place] ? ? ?If you have labs (blood work) drawn today and your tests are completely normal, you will receive your results only by: ?MyChart Message (if you have MyChart) OR ?A paper copy in the mail ?If you have any lab test that is abnormal or we need to change your treatment, we will call you to review the results. ? ? ?Testing/Procedures: NONE ORDERED  TODAY ? ? ? ?Follow-Up: ?At [facility], you and your health needs are our priority.  As part of our continuing mission to provide you with exceptional heart care, we have created designated Provider Care Teams.  These Care Teams include your primary Cardiologist (physician) and Advanced Practice Providers (APPs -  Physician Assistants and Nurse Practitioners) who all work together to provide you with the care you need, when you need it. ? ?We recommend signing up for the patient portal called "MyChart".  Sign up information is provided on this After Visit Summary.  MyChart is used to connect with patients for Virtual Visits (Telemedicine).  Patients are able to view lab/test results, encounter notes, upcoming appointments, etc.  Non-urgent messages can be sent to your provider as well.   ?To learn more about what you can do with MyChart, go to [email or web address].   ? ?Your next appointment:   ?1 year(s) ? ?The format for your next appointment:   ?In Person ? ?Provider:   ?You may see Dr.Taylor or one of the following Advanced Practice Providers on your designated Care Team:   ?Tommye Standard, PA-C ?Legrand Como "Jonni Sanger" Chalmers Cater, PA-C{ ? ? ? ?Other Instructions ? ?

## 2021-08-31 NOTE — Progress Notes (Signed)
Hematology and Oncology Follow Up Visit  [name] [id number] [birth year] 61 y.o. 08/31/2021   Principle Diagnosis:  Granulosa Cell tumor of the ovary - recurrent Iron deficiency anemia-malabsorption  Current Therapy:  Aromasin 25 mg po q day -- start on 08/17/2020  IV iron as indicated-dose given on 12/07/2020  Past Therapy:  Cisplatin/Etoposide s/p cycle#2 (given during hospital admission)   Interim History:  [name] is here today for follow-up.  Last on that we saw her was back in September of last year.  She has been quite busy.  We had her on Aromasin.  This was subsequently stopped.  We stopped it because her Inhibin B level was 79.  She did see Dr. Berline Lopes of Gynecologic Oncology.  She says that there is no role for surgical intervention.  There was a CAT scan that was done in October last year.  This showed multiple peritoneal implants.  They seem to be stable to minimally increased in size.  There is no metastatic disease to the chest, abdomen or pelvis, otherwise.  She does not have any problems with pain.  There is no problems with bowels or bladder.  She has had no nausea or vomiting.  There is been no bleeding.  She has had no leg pain or swelling.  She is still working.  She is quite busy at work.  Overall, I would say performance status is ECOG 1.  She is on Coumadin.  She has cardiac valvular issues.  Cardiology is managing this.     Medications:  Allergies as of 08/30/2021       Reactions   Augmentin [amoxicillin-pot Clavulanate] Other (See Comments)   Muscle Aches   Prochlorperazine Edisylate Other (See Comments)   Nervous/ flutter/ shakes   Adhesive [tape] Hives   Redness/hives from adhesive tape( tolerates latex gloves); tolerates paper tape        Medication List        Accurate as of August 30, 2021 11:59 PM. If you have any questions, ask your nurse or doctor.          albuterol 108 (90 Base) MCG/ACT inhaler Commonly known as: VENTOLIN  HFA TAKE 2 PUFFS BY MOUTH EVERY 6 HOURS AS NEEDED FOR WHEEZE OR SHORTNESS OF BREATH   amoxicillin 500 MG tablet Commonly known as: AMOXIL Take 2,000 mg by mouth See admin instructions. Take 2,000 mg one hour prior to dental visits.   aspirin EC 81 MG tablet Take 81 mg by mouth daily.   b complex vitamins capsule Take 1 capsule by mouth 2 (two) times daily.   carvedilol 3.125 MG tablet Commonly known as: COREG TAKE 1 TABLET (3.125 MG TOTAL) BY MOUTH 2 (TWO) TIMES DAILY WITH A MEAL.   cholecalciferol 25 MCG (1000 UNIT) tablet Commonly known as: VITAMIN D3 Take 4,000 Units by mouth daily.   furosemide 20 MG tablet Commonly known as: LASIX TAKE 1 TABLET BY MOUTH EVERY DAY   lidocaine-prilocaine cream Commonly known as: EMLA Apply 1 application topically as needed. Apply to port per instructions   loperamide 2 MG tablet Commonly known as: IMODIUM A-D Take 4 mg by mouth 3 (three) times daily as needed for diarrhea or loose stools.   LORazepam 0.5 MG tablet Commonly known as: ATIVAN TAKE ONE TABLET BY MOUTH EVERY 8 HOURS   methocarbamol 500 MG tablet Commonly known as: ROBAXIN TAKE ONE TABLET BY MOUTH EVERY 6 HOURS AS NEEDED FOR MUSCLE SPASMS   Potassium Chloride ER 20 MEQ Tbcr  Take 20 mEq by mouth daily.   spironolactone 25 MG tablet Commonly known as: ALDACTONE TAKE 1 TABLET BY MOUTH EVERY DAY   traMADol 50 MG tablet Commonly known as: ULTRAM TAKE ONE TABLET BY MOUTH EVERY 6 HOURS AS NEEDED   Vitamin D (Ergocalciferol) 1.25 MG (50000 UNIT) Caps capsule Commonly known as: DRISDOL TAKE 1 CAPSULE (50,000 UNITS TOTAL) BY MOUTH EVERY 7 (SEVEN) DAYS.   warfarin 7.5 MG tablet Commonly known as: COUMADIN Take as directed by the anticoagulation clinic. If you are unsure how to take this medication, talk to your nurse or doctor. Original instructions: TAKE AS DIRECTED BY ANTICOAGULATION CLINIC   zolpidem 10 MG tablet Commonly known as: AMBIEN TAKE ONE TABLET BY MOUTH  EVERY DAY AT bedtime        Allergies:  Allergies  Allergen Reactions   Augmentin [Amoxicillin-Pot Clavulanate] Other (See Comments)    Muscle Aches   Prochlorperazine Edisylate Other (See Comments)    Nervous/ flutter/ shakes   Adhesive [Tape] Hives    Redness/hives from adhesive tape( tolerates latex gloves); tolerates paper tape    Past Medical History, Surgical history, Social history, and Family History were reviewed and updated.  Review of Systems:  Review of Systems  Constitutional: Negative.   HENT: Negative.    Eyes: Negative.   Respiratory: Negative.    Cardiovascular: Negative.   Gastrointestinal: Negative.   Genitourinary: Negative.   Musculoskeletal: Negative.   Skin: Negative.   Neurological: Negative.   Endo/Heme/Allergies: Negative.   Psychiatric/Behavioral: Negative.       Exam:  height is '5\' 6"'$  (1.676 m) and weight is 179 lb (81.2 kg). Her oral temperature is 98 F (36.7 C). Her blood pressure is 114/73 and her pulse is 73. Her respiration is 17 and oxygen saturation is 100%.   Wt Readings from Last 3 Encounters:  08/30/21 179 lb (81.2 kg)  05/09/21 175 lb 3 oz (79.5 kg)  03/22/21 173 lb (78.5 kg)    Physical Exam Vitals reviewed.  HENT:     Head: Normocephalic and atraumatic.  Eyes:     Pupils: Pupils are equal, round, and reactive to light.  Cardiovascular:     Rate and Rhythm: Normal rate and regular rhythm.     Heart sounds: Normal heart sounds.  Pulmonary:     Effort: Pulmonary effort is normal.     Breath sounds: Normal breath sounds.  Abdominal:     General: Bowel sounds are normal.     Palpations: Abdomen is soft.  Musculoskeletal:        General: No tenderness or deformity. Normal range of motion.     Cervical back: Normal range of motion.  Lymphadenopathy:     Cervical: No cervical adenopathy.  Skin:    General: Skin is warm and dry.     Findings: No erythema or rash.  Neurological:     Mental Status: She is alert and  oriented to person, place, and time.  Psychiatric:        Behavior: Behavior normal.        Thought Content: Thought content normal.        Judgment: Judgment normal.     Lab Results  Component Value Date   WBC 7.9 08/30/2021   HGB 11.5 (L) 08/30/2021   HCT 32.9 (L) 08/30/2021   MCV 91.6 08/30/2021   PLT 155 08/30/2021   Lab Results  Component Value Date   FERRITIN 49 03/22/2021   IRON 79 03/22/2021   TIBC  300 03/22/2021   UIBC 221 03/22/2021   IRONPCTSAT 26 03/22/2021   Lab Results  Component Value Date   RETICCTPCT 2.2 08/16/2020   RBC 3.59 (L) 08/30/2021   No results found for: KPAFRELGTCHN, LAMBDASER, KAPLAMBRATIO No results found for: IGGSERUM, IGA, IGMSERUM No results found for: Odetta Pink, SPEI   Chemistry      Component Value Date/Time   NA 137 08/30/2021 1530   NA 139 08/14/2020 1428   NA 140 03/15/2016 0953   K 3.4 (L) 08/30/2021 1530   K 3.9 03/15/2016 0953   CL 100 08/30/2021 1530   CL 102 12/13/2015 0803   CO2 27 08/30/2021 1530   CO2 19 (L) 03/15/2016 0953   BUN 16 08/30/2021 1530   BUN 16 08/14/2020 1428   BUN 15.9 03/15/2016 0953   CREATININE 1.14 (H) 08/30/2021 1530   CREATININE 1.4 (H) 03/15/2016 0953      Component Value Date/Time   CALCIUM 8.7 (L) 08/30/2021 1530   CALCIUM 9.3 03/15/2016 0953   ALKPHOS 37 (L) 08/30/2021 1530   ALKPHOS 55 03/15/2016 0953   AST 20 08/30/2021 1530   AST 17 03/15/2016 0953   ALT 12 08/30/2021 1530   ALT 23 03/15/2016 0953   BILITOT 0.5 08/30/2021 1530   BILITOT 0.38 03/15/2016 0953       Impression and Plan: Ms. Corredor is a very pleasant 61 yo caucasian female with recurrent granulosa cell tumor.  We will have to see with the Inhibin B level is.  This will be critical.  I suspect that we probably will be looking at another CT scan for her.  As far as any further therapy, I suppose we could always consider immunotherapy.  I I think that we have  tried to to do molecular markers on her.  Last time was about 4 years ago.  Unfortunate, we do not have enough tissue.  I think therefore going to think about any treatment on her we will have to do another biopsy and try to get molecular markers.  Immunotherapy could always be an option for her.  Targeted therapy could certainly be considered but again, we need to have molecular markers.  We will plan to get her back depending on what we find with our inhibin B level and likely CT scan.     Volanda Napoleon, MD 3/10/202310:27 AM

## 2021-08-31 NOTE — Telephone Encounter (Signed)
Called to schedule 1 dose of iron per 3/10 sch msg, left voicemail ?

## 2021-08-31 NOTE — Telephone Encounter (Signed)
Advised via MyChart.

## 2021-08-31 NOTE — Telephone Encounter (Signed)
-----   Message from Volanda Napoleon, MD sent at 08/30/2021  4:29 PM EST ----- ?Call - the chemistry labs look ok!!  [name] ?

## 2021-08-31 NOTE — Patient Instructions (Signed)
Description   ?DOES NOT NEED A PRINTED SHEET. Continue taking 1 tablet (7.'5mg'$ ) daily except 1/2 tablet (3.'75mg'$ ) on Mondays and Fridays. Recheck INR in 6 weeks. Continue normal dark green consistency. Coumadin Clinic [phone] Main [phone] ?  ?  ?

## 2021-09-02 NOTE — Progress Notes (Deleted)
duplicate

## 2021-09-03 ENCOUNTER — Telehealth: Payer: Self-pay

## 2021-09-03 ENCOUNTER — Encounter: Payer: Self-pay | Admitting: Physician Assistant

## 2021-09-03 ENCOUNTER — Ambulatory Visit (INDEPENDENT_AMBULATORY_CARE_PROVIDER_SITE_OTHER): Payer: 59 | Admitting: Physician Assistant

## 2021-09-03 ENCOUNTER — Other Ambulatory Visit: Payer: Self-pay

## 2021-09-03 VITALS — BP 112/70 | HR 78 | Ht 66.0 in | Wt 179.2 lb

## 2021-09-03 DIAGNOSIS — I2510 Atherosclerotic heart disease of native coronary artery without angina pectoris: Secondary | ICD-10-CM

## 2021-09-03 DIAGNOSIS — E785 Hyperlipidemia, unspecified: Secondary | ICD-10-CM

## 2021-09-03 DIAGNOSIS — I4729 Other ventricular tachycardia: Secondary | ICD-10-CM

## 2021-09-03 DIAGNOSIS — I38 Endocarditis, valve unspecified: Secondary | ICD-10-CM | POA: Diagnosis not present

## 2021-09-03 DIAGNOSIS — Z952 Presence of prosthetic heart valve: Secondary | ICD-10-CM

## 2021-09-03 DIAGNOSIS — Z4502 Encounter for adjustment and management of automatic implantable cardiac defibrillator: Secondary | ICD-10-CM

## 2021-09-03 DIAGNOSIS — I5022 Chronic systolic (congestive) heart failure: Secondary | ICD-10-CM

## 2021-09-03 DIAGNOSIS — I480 Paroxysmal atrial fibrillation: Secondary | ICD-10-CM

## 2021-09-03 MED ORDER — FUROSEMIDE 20 MG PO TABS: 20.0000 mg | ORAL_TABLET | Freq: Every day | ORAL | 3 refills | Status: DC

## 2021-09-03 MED ORDER — SPIRONOLACTONE 25 MG PO TABS: 25.0000 mg | ORAL_TABLET | Freq: Every day | ORAL | 3 refills | Status: DC

## 2021-09-03 MED ORDER — POTASSIUM CHLORIDE ER 20 MEQ PO TBCR: 20.0000 meq | EXTENDED_RELEASE_TABLET | Freq: Every day | ORAL | 3 refills | Status: DC

## 2021-09-03 MED ORDER — CARVEDILOL 3.125 MG PO TABS: 3.1250 mg | ORAL_TABLET | Freq: Two times a day (BID) | ORAL | 2 refills | Status: DC

## 2021-09-03 NOTE — Telephone Encounter (Signed)
Called and informed patient of lab results, patient stated she wanted to wait until her next f/u in 3 months to complete the CT scan as she is having issues with her kidneys currently. Per Dr Marin Olp that is fine to wait and he is very happy with her numbers. Message sent to scheduling for a 3 month apt.   ? ?

## 2021-09-03 NOTE — Progress Notes (Signed)
Office Visit    Patient Name: [name] Date of Encounter: 09/03/2021  PCP:  Volanda Napoleon, MD   [place]  Cardiologist:  Sinclair Grooms, MD  Advanced Practice Provider:  No care team member to display Electrophysiologist:  None   Chief Complaint    [name] is a 61 y.o. 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  She was seen 2 days ago by EP.  Overall she was doing okay.  She was anxiously awaiting information back from oncology.  Cardiac wise she had no complaints.  No chest pain, palpitations, shortness of breath, dizzy spells, near syncope/syncope, shocks, or bleeding.  Today, she states that she was hurting all over yesterday and overall had a bad day.  Today she feels much better.  She states that at times her blood pressure gets on the low side but she denies any dizziness or lightheadedness.  She denies palpitations and chest pain.  She states that she got her defibrillator checked on Friday and it states that she was in atrial fibrillation 1 time per year.  She is usually asymptomatic.  We discussed a surveillance echocardiogram for her TMVR and she preferred to do this next March.  She is asymptomatic at this time and denied any lower extremity edema or weight gain due to fluid.  She appeared euvolemic on exam today.  Otherwise, she is doing well from a CV  standpoint.  Reports no shortness of breath nor dyspnea on exertion. Reports no chest pain, pressure, or tightness. No edema, orthopnea, PND. Reports no palpitations.    Past Medical History    Past Medical History:  Diagnosis Date   Arthritis    "everywhere"   Asthma    as a child   Automatic implantable cardioverter-defibrillator in situ    DR. Beckie Salts    Bladder cancer [facility]) 2009   "injected medicine to get rid of it"   Chronic systolic CHF (congestive heart failure) ([place])    CKD (chronic kidney disease), stage III ([place])    Coronary artery disease    a. large RV/inferior MI complicated by pseudoaneurysm s/p aneurysemectomy and CABG 10/2009 with papillary muscle rupture requiring bioprosthetic mitral valve replacement in [facility].   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  ICM and VT arrest 10/2009 s/p AICD (revision 01/2012 due to RV lead problem).   Myocardial infarction ([place]) 10/14/2009   Neuropathy    FEET AND HANDS - FROM CHEMO - BUT MUCH IMPROVED   PAF (paroxysmal atrial fibrillation) (HCC)    Pain    JOINT PAINS AND MUSCLE ACHES ALL OVER.   Port-A-Cath in place    RIGHT  UPPER CHEST   Prosthetic valve dysfunction    S/P mitral valve replacement with bioprosthetic valve 10/21/2009   Edwards Perimount stented bovine pericardial tissue valve, size 29 mm   S/P valve-in-valve transcatheter mitral valve replacement 09/16/2017   29 mm Edwards Sapien 3 transcatheter heart valve placed via transapical approach   SVT (supraventricular tachycardia) (HCC)    Tobacco abuse    Ventricular tachycardia    Past Surgical History:  Procedure Laterality Date   ABDOMINAL HYSTERECTOMY N/A 07/27/2013   Procedure: TUMOR EXCISION OF  ABDOMINAL MASS;  Surgeon: Alvino Chapel, MD;  Location: [facility];  Service: Gynecology;  Laterality: N/A;   APPENDECTOMY  1989   BILATERAL OOPHORECTOMY  2007   CARDIAC DEFIBRILLATOR PLACEMENT  02/06/2012   "lead change"   CARDIAC VALVE REPLACEMENT     CYSTOSCOPY Right 02/23/2013   Procedure: CYSTOSCOPY WITH STENT REMOVAL;  Surgeon: Alvino Chapel, MD;  Location: [facility];  Service: Gynecology;  Laterality: Right;   ICD GENERATOR CHANGEOUT N/A 04/03/2020   Procedure: [place];  Surgeon: Evans Lance, MD;  Location: [place] CV LAB;  Service: Cardiovascular;  Laterality: N/A;   IMPLANTABLE CARDIOVERTER DEFIBRILLATOR REVISION N/A 02/06/2012   Procedure: IMPLANTABLE CARDIOVERTER DEFIBRILLATOR REVISION;  Surgeon: Evans Lance, MD;  Location: [facility] CATH LAB;  Service: Cardiovascular;  Laterality: N/A;   INSERT / REPLACE / REMOVE PACEMAKER  10/2009   DUAL-CHAMBER [place] DEFIBRILLATOR   IR IMAGING GUIDED PORT INSERTION  05/11/2018   LAPAROTOMY N/A 01/19/2013   Procedure: TUMOR DEBULKING / BOWEL RESECTION/INSERTION RIGHT UTERERAL DOUBLE J STENT/OMENTECTOMY;  Surgeon: Alvino Chapel, MD;  Location: [facility];  Service: Gynecology;  Laterality: N/A;   MITRAL VALVE REPLACEMENT  10/21/2009   12m Edwards Perimount bovine pericardial tissue valve - [place]   RIGHT/LEFT HEART CATH AND CORONARY ANGIOGRAPHY N/A 07/02/2017   Procedure: RIGHT/LEFT HEART CATH AND CORONARY ANGIOGRAPHY;  Surgeon: SBelva Crome MD;  [facility];  Service: Cardiovascular;  Laterality: N/A;   TEE WITHOUT CARDIOVERSION N/A 09/16/2017   Procedure: TRANSESOPHAGEAL ECHOCARDIOGRAM (TEE);  Surgeon: CSherren Mocha MD;  [place] [facility]  Service: Open Heart Surgery;  Laterality: N/A;   THYMECTOMY  2009   TONSILLECTOMY AND ADENOIDECTOMY  ~ 1967   TOTAL HIP ARTHROPLASTY  05/06/2012   Procedure: TOTAL HIP ARTHROPLASTY;  Surgeon: FGearlean Alf MD;  Location: [facility];  Service:  Orthopedics;  Laterality: Right;   TRANSCATHETER MITRAL VALVE REPLACEMENT, TRANSAPICAL N/A 09/16/2017   Procedure: TRANSCATHETER MITRAL VALVE REPLACEMENT,TRANSAPICAL;  Surgeon: CSherren Mocha MD;  [place] [facility]  Service: Open Heart Surgery;  Laterality: N/A;   TRANSURETHRAL RESECTION OF BLADDER  2009   "for bladder cancer"   [place]   Allergies  Allergies  Allergen Reactions   Augmentin [Amoxicillin-Pot Clavulanate] Other (See Comments)    Muscle Aches   Prochlorperazine Edisylate Other (See Comments)    Nervous/ flutter/ shakes   Adhesive [Tape] Hives    Redness/hives from adhesive tape( tolerates latex gloves); tolerates paper tape     EKGs/Labs/Other Studies Reviewed:   The following studies were reviewed today:  08/14/2020 echocardiogram  IMPRESSIONS    1. 29 mm S3 ViV TMVR. Emax 1.7 m/s, MG 6 mmHG @ 80 bpm, EOA 2.17 cm2, DVI  1.6. Normal prosthesis. No evidence of LVOT obstruction. The mitral valve  has been repaired/replaced. No evidence of mitral valve regurgitation.  There is a 29 mm  Sapien 3 ViV TMVR   present in the mitral position. Procedure Date: 09/16/17.   2. Left ventricular ejection fraction, by estimation, is 45 to 50%. The  left ventricle has mildly decreased function. The left ventricle has no  regional wall motion abnormalities. Left ventricular diastolic function  could not be evaluated.   3. Right ventricular systolic function is normal. The right ventricular  size is normal. Tricuspid regurgitation signal is inadequate for assessing  PA pressure.   4. Left atrial size was mildly dilated.   5. The aortic valve is tricuspid. There is mild calcification of the  aortic valve. There is mild thickening of the aortic valve. Aortic valve  regurgitation is not visualized. Mild aortic valve sclerosis is present,  with no evidence of aortic valve  stenosis.   6. The inferior vena cava is normal in size with  greater than 50%  respiratory variability, suggesting right atrial pressure of 3 mmHg.   Comparison(s): No significant change from prior study. EF remains ~45%.  Basal to mid inferior wall is aneurysmal. Apex appears akinetic. TMVR is  stable.   EKG:  EKG is not ordered today.   Recent Labs: 08/30/2021: ALT 12; BUN 16; Creatinine 1.14; Hemoglobin 11.5; Platelet Count 155; Potassium 3.4; Sodium 137  Recent Lipid Panel    Component Value Date/Time   CHOL 247 (H) 09/18/2017 0240   TRIG 144 09/18/2017 0240   HDL 37 (L) 09/18/2017 0240   CHOLHDL 6.7 09/18/2017 0240   VLDL 29 09/18/2017 0240   LDLCALC 181 (H) 09/18/2017 0240    Risk Assessment/Calculations:   CHA2DS2-VASc Score = 3   This indicates a 3.2% annual risk of stroke. The patient's score is based upon: CHF History: 1 HTN History: 0 Diabetes History: 0 Stroke History: 0 Vascular Disease History: 1 Age Score: 0 Gender Score: 1     Home Medications   Current Meds  Medication Sig   albuterol (VENTOLIN HFA) 108 (90 Base) MCG/ACT inhaler TAKE 2 PUFFS BY MOUTH EVERY 6 HOURS AS NEEDED FOR WHEEZE OR SHORTNESS OF BREATH   amoxicillin (AMOXIL) 500 MG tablet Take 2,000 mg by mouth See admin instructions. Take 2,000 mg one hour prior to dental visits.   aspirin EC 81 MG tablet Take 81 mg by mouth daily.   b complex vitamins capsule Take 1 capsule by mouth 2 (two) times daily.   cholecalciferol (VITAMIN D3) 25 MCG (1000 UNIT) tablet Take 4,000 Units by mouth daily.   lidocaine-prilocaine (EMLA) cream Apply 1 application topically as needed. Apply to port per instructions   loperamide (IMODIUM A-D) 2 MG tablet Take 4 mg by mouth 3 (three) times daily as needed for diarrhea or loose stools.   LORazepam (ATIVAN) 0.5 MG tablet TAKE ONE TABLET BY MOUTH EVERY 8 HOURS   methocarbamol (ROBAXIN) 500 MG tablet TAKE ONE TABLET BY MOUTH EVERY 6 HOURS AS NEEDED FOR MUSCLE SPASMS   traMADol (ULTRAM) 50 MG tablet TAKE ONE TABLET BY MOUTH EVERY  6 HOURS AS NEEDED   Vitamin D, Ergocalciferol, (DRISDOL) 1.25 MG (50000 UNIT) CAPS capsule TAKE 1 CAPSULE (50,000 UNITS TOTAL) BY MOUTH EVERY 7 (SEVEN) DAYS.   warfarin (COUMADIN) 7.5 MG tablet TAKE AS DIRECTED BY ANTICOAGULATION CLINIC   zolpidem (AMBIEN) 10 MG tablet TAKE ONE TABLET BY MOUTH EVERY DAY AT bedtime   [DISCONTINUED] carvedilol (COREG) 3.125 MG tablet TAKE 1 TABLET (3.125 MG TOTAL) BY MOUTH 2 (TWO) TIMES DAILY WITH A MEAL.   [DISCONTINUED] furosemide (LASIX) 20 MG tablet  TAKE 1 TABLET BY MOUTH EVERY DAY   [DISCONTINUED] Potassium Chloride ER 20 MEQ TBCR Take 20 mEq by mouth daily.   [DISCONTINUED] spironolactone (ALDACTONE) 25 MG tablet TAKE 1 TABLET BY MOUTH EVERY DAY     Review of Systems      All other systems reviewed and are otherwise negative except as noted above.  Physical Exam    VS:  BP 112/70    Pulse 78    Ht '5\' 6"'$  (1.676 m)    Wt 179 lb 3.2 oz (81.3 kg)    SpO2 97%    BMI 28.92 kg/m  , BMI Body mass index is 28.92 kg/m.  Wt Readings from Last 3 Encounters:  09/03/21 179 lb 3.2 oz (81.3 kg)  08/31/21 177 lb (80.3 kg)  08/30/21 179 lb (81.2 kg)     GEN: Well nourished, well developed, in no acute distress. HEENT: normal. Neck: Supple, no JVD, carotid bruits, or masses. Cardiac: RRR, no murmurs, rubs, or gallops. No clubbing, cyanosis, edema.  Radials/PT 2+ and equal bilaterally.  Respiratory:  Respirations regular and unlabored, clear to auscultation bilaterally. GI: Soft, nontender, nondistended. MS: No deformity or atrophy. Skin: Warm and dry, no rash. Neuro:  Strength and sensation are intact. Psych: Normal affect.  Assessment & Plan    CAD s/p CABG -No chest pain currently -Continue GDMT: Aspirin 81 mg, carvedilol 3.125 mg, furosemide 20 mg, potassium 20 mEq, and spironolactone 25 mg  Status post transcatheter mitral valve replacement -originally replaced surgically with bioprosthetic valve, then TMVR -Asymptomatic -No murmur on exam -Will  plan for surveillance Echocardiogram next March 2024  Paroxysmal atrial fibrillation -CHA2DS2-VASc is 3 -Continue Coumadin and routine INR checks -According to her device interrogation she only has atrial fibrillation about once a year  Chronic systolic heart failure -No volume overload evident on physical exam today -No history of lower extremity edema or weight changes -Continue current diuretic regimen, potassium refilled (mild hypokalemia on recent labs)  ICD -recent interrogation Friday with EP -No issues at that time  NSVT -None noted by EP -She has not had any palpitations  7. Hyperlipidemia -monitored by her PCP who is also her oncologist -Goal LDL < 70 due to history of CAD    Disposition: Follow up  1 year  with Sinclair Grooms, MD or APP.  Signed, Elgie Collard, PA-C 09/03/2021, 4:12 PM [facility]

## 2021-09-03 NOTE — Telephone Encounter (Signed)
-----   Message from Volanda Napoleon, MD sent at 08/31/2021  4:22 PM EST ----- ?Please call her and let her know that the inhibin B level is only 26.  This is fantastic.  I still think we need to do a CT scan.  I will set this up for early next week. ?

## 2021-09-03 NOTE — Patient Instructions (Signed)
Medication Instructions:  ?Your physician recommends that you continue on your current medications as directed. Please refer to the Current Medication list given to you today.  ?*If you need a refill on your cardiac medications before your next appointment, please call your pharmacy* ? ? ?Lab Work: ?None ?If you have labs (blood work) drawn today and your tests are completely normal, you will receive your results only by: ?MyChart Message (if you have MyChart) OR ?A paper copy in the mail ?If you have any lab test that is abnormal or we need to change your treatment, we will call you to review the results. ? ? ?Testing/Procedures: ?Your physician has requested that you have an echocardiogram in March of 2024 prior to your one year follow up appointment. Echocardiography is a painless test that uses sound waves to create images of your heart. It provides your doctor with information about the size and shape of your heart and how well your heart?s chambers and valves are working. This procedure takes approximately one hour. There are no restrictions for this procedure.  ? ? ?Follow-Up: ?At [facility], you and your health needs are our priority.  As part of our continuing mission to provide you with exceptional heart care, we have created designated Provider Care Teams.  These Care Teams include your primary Cardiologist (physician) and Advanced Practice Providers (APPs -  Physician Assistants and Nurse Practitioners) who all work together to provide you with the care you need, when you need it. ? ? ?Your next appointment:   ?1 year(s) ? ?The format for your next appointment:   ?In Person ? ?Provider:   ?Sinclair Grooms, MD or APP ?

## 2021-09-05 ENCOUNTER — Other Ambulatory Visit (INDEPENDENT_AMBULATORY_CARE_PROVIDER_SITE_OTHER): Payer: 59

## 2021-09-05 DIAGNOSIS — I480 Paroxysmal atrial fibrillation: Secondary | ICD-10-CM

## 2021-09-06 ENCOUNTER — Other Ambulatory Visit: Payer: Self-pay | Admitting: Hematology & Oncology

## 2021-09-06 ENCOUNTER — Other Ambulatory Visit: Payer: Self-pay

## 2021-09-06 ENCOUNTER — Inpatient Hospital Stay: Payer: 59

## 2021-09-06 VITALS — BP 103/57 | HR 78 | Temp 97.6°F | Resp 16

## 2021-09-06 DIAGNOSIS — D508 Other iron deficiency anemias: Secondary | ICD-10-CM | POA: Diagnosis not present

## 2021-09-06 MED ORDER — SODIUM CHLORIDE 0.9 % IV SOLN
125.0000 mg | Freq: Once | INTRAVENOUS | Status: AC
  Administered 2021-09-06: 125 mg via INTRAVENOUS
  Filled 2021-09-06: qty 125

## 2021-09-06 MED ORDER — SODIUM CHLORIDE 0.9 % IV SOLN: Freq: Once | INTRAVENOUS | Status: AC

## 2021-09-06 NOTE — Patient Instructions (Signed)
Sodium Ferric Gluconate Complex Injection ?What is this medication? ?SODIUM FERRIC GLUCONATE COMPLEX (SOE dee um FER ik GLOO koe nate KOM pleks) treats low levels of iron (iron deficiency anemia) in people with kidney disease. Iron is a mineral that plays an important role in making red blood cells, which carry oxygen from your lungs to the rest of your body. ?This medicine may be used for other purposes; ask your health care provider or pharmacist if you have questions. ?COMMON BRAND NAME(S): Ferrlecit, Nulecit ?What should I tell my care team before I take this medication? ?They need to know if you have any of the following conditions: ?Anemia that is not from iron deficiency ?High levels of iron in the blood ?An unusual or allergic reaction to iron, other medications, foods, dyes, or preservatives ?Pregnant or are trying to become pregnant ?Breast-feeding ?How should I use this medication? ?This medication is injected into a vein. It is given by your care team in a hospital or clinic setting. ?Talk to your care team about the use of this medication in children. While it may be prescribed for children as young as 6 years for selected conditions, precautions do apply. ?Overdosage: If you think you have taken too much of this medicine contact a poison control center or emergency room at once. ?NOTE: This medicine is only for you. Do not share this medicine with others. ?What if I miss a dose? ?It is important not to miss your dose. Call your care team if you are unable to keep an appointment. ?What may interact with this medication? ?Do not take this medication with any of the following: ?Deferasirox ?Deferoxamine ?Dimercaprol ?This medication may also interact with the following: ?Other iron products ?This list may not describe all possible interactions. Give your health care provider a list of all the medicines, herbs, non-prescription drugs, or dietary supplements you use. Also tell them if you smoke, drink  alcohol, or use illegal drugs. Some items may interact with your medicine. ?What should I watch for while using this medication? ?Your condition will be monitored carefully while you are receiving this medication. ?Visit your care team for regular checks on your progress. You may need blood work while you are taking this medication. ?What side effects may I notice from receiving this medication? ?Side effects that you should report to your care team as soon as possible: ?Allergic reactions--skin rash, itching, hives, swelling of the face, lips, tongue, or throat ?Low blood pressure--dizziness, feeling faint or lightheaded, blurry vision ?Shortness of breath ?Side effects that usually do not require medical attention (report to your care team if they continue or are bothersome): ?Flushing ?Headache ?Joint pain ?Muscle pain ?Nausea ?Pain, redness, or irritation at injection site ?This list may not describe all possible side effects. Call your doctor for medical advice about side effects. You may report side effects to FDA at 1-800-FDA-1088. ?Where should I keep my medication? ?This medication is given in a hospital or clinic and will not be stored at home. ?NOTE: This sheet is a summary. It may not cover all possible information. If you have questions about this medicine, talk to your doctor, pharmacist, or health care provider. ?? 2022 Elsevier/Gold Standard (2020-11-03 00:00:00) ? ?

## 2021-09-15 ENCOUNTER — Other Ambulatory Visit: Payer: Self-pay | Admitting: Hematology & Oncology

## 2021-09-17 ENCOUNTER — Encounter: Payer: Self-pay | Admitting: Hematology & Oncology

## 2021-09-20 ENCOUNTER — Other Ambulatory Visit: Payer: Self-pay | Admitting: Hematology & Oncology

## 2021-09-20 DIAGNOSIS — D3910 Neoplasm of uncertain behavior of unspecified ovary: Secondary | ICD-10-CM

## 2021-10-01 ENCOUNTER — Other Ambulatory Visit: Payer: Self-pay | Admitting: Hematology & Oncology

## 2021-10-01 DIAGNOSIS — F419 Anxiety disorder, unspecified: Secondary | ICD-10-CM

## 2021-10-02 ENCOUNTER — Ambulatory Visit (INDEPENDENT_AMBULATORY_CARE_PROVIDER_SITE_OTHER): Payer: 59

## 2021-10-02 DIAGNOSIS — I255 Ischemic cardiomyopathy: Secondary | ICD-10-CM | POA: Diagnosis not present

## 2021-10-02 LAB — CUP PACEART REMOTE DEVICE CHECK
Battery Remaining Longevity: 89 mo
Battery Remaining Percentage: 84 %
Battery Voltage: 3.01 V
Brady Statistic AP VP Percent: 0 %
Brady Statistic AP VS Percent: 1 %
Brady Statistic AS VP Percent: 1 %
Brady Statistic AS VS Percent: 99 %
Brady Statistic RA Percent Paced: 1 %
Brady Statistic RV Percent Paced: 1 %
Date Time Interrogation Session: 20230411063055
HighPow Impedance: 74 Ohm
Implantable Lead Implant Date: 20110503
Implantable Lead Implant Date: 20130815
Implantable Lead Location: 753859
Implantable Lead Location: 753860
Implantable Pulse Generator Implant Date: 20211012
Lead Channel Impedance Value: 260 Ohm
Lead Channel Impedance Value: 400 Ohm
Lead Channel Pacing Threshold Amplitude: 0.5 V
Lead Channel Pacing Threshold Amplitude: 1.625 V
Lead Channel Pacing Threshold Pulse Width: 0.5 ms
Lead Channel Pacing Threshold Pulse Width: 1 ms
Lead Channel Sensing Intrinsic Amplitude: 12 mV
Lead Channel Sensing Intrinsic Amplitude: 2.1 mV
Lead Channel Setting Pacing Amplitude: 1.875
Lead Channel Setting Pacing Amplitude: 2 V
Lead Channel Setting Pacing Pulse Width: 1 ms
Lead Channel Setting Sensing Sensitivity: 0.5 mV
Pulse Gen Serial Number: 111028151

## 2021-10-06 ENCOUNTER — Other Ambulatory Visit: Payer: Self-pay | Admitting: Hematology & Oncology

## 2021-10-06 DIAGNOSIS — D3910 Neoplasm of uncertain behavior of unspecified ovary: Secondary | ICD-10-CM

## 2021-10-08 ENCOUNTER — Encounter: Payer: Self-pay | Admitting: Hematology & Oncology

## 2021-10-12 ENCOUNTER — Ambulatory Visit (INDEPENDENT_AMBULATORY_CARE_PROVIDER_SITE_OTHER): Payer: 59

## 2021-10-12 DIAGNOSIS — I480 Paroxysmal atrial fibrillation: Secondary | ICD-10-CM | POA: Diagnosis not present

## 2021-10-12 DIAGNOSIS — Z952 Presence of prosthetic heart valve: Secondary | ICD-10-CM | POA: Diagnosis not present

## 2021-10-12 DIAGNOSIS — Z953 Presence of xenogenic heart valve: Secondary | ICD-10-CM

## 2021-10-12 LAB — POCT INR: INR: 3.5 — AB (ref 2.0–3.0)

## 2021-10-12 NOTE — Patient Instructions (Addendum)
Description   ?DOES NOT NEED A PRINTED SHEET. Hold today's dose and then continue taking 1 tablet (7.'5mg'$ ) daily except 1/2 tablet (3.'75mg'$ ) on Mondays and Fridays.  ?Recheck INR in 5 weeks.  ?Continue normal dark green consistency.  ?[facility] [phone] Main [phone] ?  ?   ?

## 2021-10-18 NOTE — Progress Notes (Signed)
Remote ICD transmission.   

## 2021-10-22 ENCOUNTER — Other Ambulatory Visit: Payer: Self-pay | Admitting: Hematology & Oncology

## 2021-10-22 DIAGNOSIS — F419 Anxiety disorder, unspecified: Secondary | ICD-10-CM

## 2021-10-22 DIAGNOSIS — D3910 Neoplasm of uncertain behavior of unspecified ovary: Secondary | ICD-10-CM

## 2021-11-02 ENCOUNTER — Other Ambulatory Visit: Payer: Self-pay | Admitting: Hematology & Oncology

## 2021-11-02 ENCOUNTER — Other Ambulatory Visit: Payer: Self-pay | Admitting: Interventional Cardiology

## 2021-11-02 DIAGNOSIS — D3910 Neoplasm of uncertain behavior of unspecified ovary: Secondary | ICD-10-CM

## 2021-11-02 DIAGNOSIS — I480 Paroxysmal atrial fibrillation: Secondary | ICD-10-CM

## 2021-11-06 ENCOUNTER — Other Ambulatory Visit: Payer: Self-pay | Admitting: Hematology & Oncology

## 2021-11-06 DIAGNOSIS — D3910 Neoplasm of uncertain behavior of unspecified ovary: Secondary | ICD-10-CM

## 2021-11-13 ENCOUNTER — Other Ambulatory Visit: Payer: Self-pay | Admitting: Hematology & Oncology

## 2021-11-13 DIAGNOSIS — F419 Anxiety disorder, unspecified: Secondary | ICD-10-CM

## 2021-11-14 ENCOUNTER — Encounter: Payer: Self-pay | Admitting: Hematology & Oncology

## 2021-11-15 ENCOUNTER — Other Ambulatory Visit: Payer: Self-pay | Admitting: Hematology & Oncology

## 2021-11-15 DIAGNOSIS — D3910 Neoplasm of uncertain behavior of unspecified ovary: Secondary | ICD-10-CM

## 2021-11-16 ENCOUNTER — Ambulatory Visit (INDEPENDENT_AMBULATORY_CARE_PROVIDER_SITE_OTHER): Payer: 59 | Admitting: *Deleted

## 2021-11-16 DIAGNOSIS — I480 Paroxysmal atrial fibrillation: Secondary | ICD-10-CM

## 2021-11-16 DIAGNOSIS — Z952 Presence of prosthetic heart valve: Secondary | ICD-10-CM | POA: Diagnosis not present

## 2021-11-16 DIAGNOSIS — Z5181 Encounter for therapeutic drug level monitoring: Secondary | ICD-10-CM

## 2021-11-16 DIAGNOSIS — Z953 Presence of xenogenic heart valve: Secondary | ICD-10-CM | POA: Diagnosis not present

## 2021-11-16 LAB — POCT INR: INR: 3.7 — AB (ref 2.0–3.0)

## 2021-11-16 NOTE — Patient Instructions (Signed)
Description   Hold warfarin today Then START taking warfarin 1 tablet daily except for 1/2 a tablet on Monday, Wednesday and Friday. Recheck INR in 3 weeks.  [facility] [phone]

## 2021-11-29 ENCOUNTER — Other Ambulatory Visit: Payer: Self-pay | Admitting: *Deleted

## 2021-11-29 DIAGNOSIS — D3910 Neoplasm of uncertain behavior of unspecified ovary: Secondary | ICD-10-CM

## 2021-11-30 ENCOUNTER — Other Ambulatory Visit: Payer: Self-pay | Admitting: Lab

## 2021-11-30 ENCOUNTER — Other Ambulatory Visit: Payer: Self-pay

## 2021-11-30 ENCOUNTER — Inpatient Hospital Stay: Payer: 59

## 2021-11-30 ENCOUNTER — Inpatient Hospital Stay: Payer: 59 | Attending: Hematology & Oncology

## 2021-11-30 ENCOUNTER — Inpatient Hospital Stay (HOSPITAL_BASED_OUTPATIENT_CLINIC_OR_DEPARTMENT_OTHER): Payer: 59 | Admitting: Hematology & Oncology

## 2021-11-30 ENCOUNTER — Other Ambulatory Visit: Payer: Self-pay | Admitting: *Deleted

## 2021-11-30 ENCOUNTER — Encounter: Payer: Self-pay | Admitting: Hematology & Oncology

## 2021-11-30 VITALS — BP 108/59 | HR 71 | Temp 98.2°F | Resp 16 | Wt 176.0 lb

## 2021-11-30 DIAGNOSIS — E7800 Pure hypercholesterolemia, unspecified: Secondary | ICD-10-CM | POA: Diagnosis not present

## 2021-11-30 DIAGNOSIS — I5022 Chronic systolic (congestive) heart failure: Secondary | ICD-10-CM | POA: Insufficient documentation

## 2021-11-30 DIAGNOSIS — I255 Ischemic cardiomyopathy: Secondary | ICD-10-CM | POA: Insufficient documentation

## 2021-11-30 DIAGNOSIS — I480 Paroxysmal atrial fibrillation: Secondary | ICD-10-CM | POA: Diagnosis not present

## 2021-11-30 DIAGNOSIS — Z79899 Other long term (current) drug therapy: Secondary | ICD-10-CM | POA: Insufficient documentation

## 2021-11-30 DIAGNOSIS — Z8674 Personal history of sudden cardiac arrest: Secondary | ICD-10-CM | POA: Diagnosis not present

## 2021-11-30 DIAGNOSIS — D3910 Neoplasm of uncertain behavior of unspecified ovary: Secondary | ICD-10-CM

## 2021-11-30 DIAGNOSIS — I2510 Atherosclerotic heart disease of native coronary artery without angina pectoris: Secondary | ICD-10-CM | POA: Diagnosis not present

## 2021-11-30 DIAGNOSIS — Z452 Encounter for adjustment and management of vascular access device: Secondary | ICD-10-CM | POA: Insufficient documentation

## 2021-11-30 DIAGNOSIS — Z9221 Personal history of antineoplastic chemotherapy: Secondary | ICD-10-CM | POA: Diagnosis not present

## 2021-11-30 DIAGNOSIS — D500 Iron deficiency anemia secondary to blood loss (chronic): Secondary | ICD-10-CM

## 2021-11-30 DIAGNOSIS — I252 Old myocardial infarction: Secondary | ICD-10-CM | POA: Diagnosis not present

## 2021-11-30 DIAGNOSIS — C569 Malignant neoplasm of unspecified ovary: Secondary | ICD-10-CM | POA: Diagnosis present

## 2021-11-30 DIAGNOSIS — Z95828 Presence of other vascular implants and grafts: Secondary | ICD-10-CM

## 2021-11-30 DIAGNOSIS — I471 Supraventricular tachycardia: Secondary | ICD-10-CM | POA: Insufficient documentation

## 2021-11-30 DIAGNOSIS — Z952 Presence of prosthetic heart valve: Secondary | ICD-10-CM | POA: Insufficient documentation

## 2021-11-30 DIAGNOSIS — D508 Other iron deficiency anemias: Secondary | ICD-10-CM | POA: Insufficient documentation

## 2021-11-30 DIAGNOSIS — Z7901 Long term (current) use of anticoagulants: Secondary | ICD-10-CM | POA: Insufficient documentation

## 2021-11-30 DIAGNOSIS — N183 Chronic kidney disease, stage 3 unspecified: Secondary | ICD-10-CM | POA: Diagnosis not present

## 2021-11-30 DIAGNOSIS — D6481 Anemia due to antineoplastic chemotherapy: Secondary | ICD-10-CM

## 2021-11-30 DIAGNOSIS — Z923 Personal history of irradiation: Secondary | ICD-10-CM | POA: Diagnosis not present

## 2021-11-30 DIAGNOSIS — Z79811 Long term (current) use of aromatase inhibitors: Secondary | ICD-10-CM | POA: Insufficient documentation

## 2021-11-30 DIAGNOSIS — E039 Hypothyroidism, unspecified: Secondary | ICD-10-CM | POA: Insufficient documentation

## 2021-11-30 DIAGNOSIS — Z95810 Presence of automatic (implantable) cardiac defibrillator: Secondary | ICD-10-CM | POA: Insufficient documentation

## 2021-11-30 DIAGNOSIS — K909 Intestinal malabsorption, unspecified: Secondary | ICD-10-CM | POA: Insufficient documentation

## 2021-11-30 LAB — CBC WITH DIFFERENTIAL (CANCER CENTER ONLY)
Abs Immature Granulocytes: 0.02 10*3/uL (ref 0.00–0.07)
Basophils Absolute: 0 10*3/uL (ref 0.0–0.1)
Basophils Relative: 0 %
Eosinophils Absolute: 0.1 10*3/uL (ref 0.0–0.5)
Eosinophils Relative: 1 %
HCT: 33.8 % — ABNORMAL LOW (ref 36.0–46.0)
Hemoglobin: 11.6 g/dL — ABNORMAL LOW (ref 12.0–15.0)
Immature Granulocytes: 0 %
Lymphocytes Relative: 10 %
Lymphs Abs: 0.7 10*3/uL (ref 0.7–4.0)
MCH: 32 pg (ref 26.0–34.0)
MCHC: 34.3 g/dL (ref 30.0–36.0)
MCV: 93.1 fL (ref 80.0–100.0)
Monocytes Absolute: 0.7 10*3/uL (ref 0.1–1.0)
Monocytes Relative: 9 %
Neutro Abs: 5.9 10*3/uL (ref 1.7–7.7)
Neutrophils Relative %: 80 %
Platelet Count: 168 10*3/uL (ref 150–400)
RBC: 3.63 MIL/uL — ABNORMAL LOW (ref 3.87–5.11)
RDW: 13.2 % (ref 11.5–15.5)
WBC Count: 7.4 10*3/uL (ref 4.0–10.5)
nRBC: 0 % (ref 0.0–0.2)

## 2021-11-30 LAB — CMP (CANCER CENTER ONLY)
ALT: 8 U/L (ref 0–44)
AST: 14 U/L — ABNORMAL LOW (ref 15–41)
Albumin: 4 g/dL (ref 3.5–5.0)
Alkaline Phosphatase: 36 U/L — ABNORMAL LOW (ref 38–126)
Anion gap: 9 (ref 5–15)
BUN: 14 mg/dL (ref 8–23)
CO2: 29 mmol/L (ref 22–32)
Calcium: 9.4 mg/dL (ref 8.9–10.3)
Chloride: 101 mmol/L (ref 98–111)
Creatinine: 1.23 mg/dL — ABNORMAL HIGH (ref 0.44–1.00)
GFR, Estimated: 50 mL/min — ABNORMAL LOW (ref >60)
Glucose, Bld: 96 mg/dL (ref 70–99)
Potassium: 3.2 mmol/L — ABNORMAL LOW (ref 3.5–5.1)
Sodium: 139 mmol/L (ref 135–145)
Total Bilirubin: 0.4 mg/dL (ref 0.3–1.2)
Total Protein: 6.1 g/dL — ABNORMAL LOW (ref 6.5–8.1)

## 2021-11-30 LAB — IRON AND IRON BINDING CAPACITY (CC-WL,HP ONLY)
Iron: 63 ug/dL (ref 28–170)
Saturation Ratios: 18 % (ref 10.4–31.8)
TIBC: 356 ug/dL (ref 250–450)
UIBC: 293 ug/dL (ref 148–442)

## 2021-11-30 LAB — FERRITIN: Ferritin: 23 ng/mL (ref 11–307)

## 2021-11-30 MED ORDER — HEPARIN SOD (PORK) LOCK FLUSH 100 UNIT/ML IV SOLN
500.0000 [IU] | Freq: Once | INTRAVENOUS | Status: AC
  Administered 2021-11-30: 500 [IU] via INTRAVENOUS

## 2021-11-30 MED ORDER — SODIUM CHLORIDE 0.9% FLUSH
10.0000 mL | INTRAVENOUS | Status: DC | PRN
  Administered 2021-11-30: 10 mL via INTRAVENOUS

## 2021-11-30 NOTE — Patient Instructions (Signed)

## 2021-11-30 NOTE — Progress Notes (Signed)
Hematology and Oncology Follow Up Visit  [name] [id number] 05-Jun-1961 61 y.o. 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  We last saw her back in March.  Since then, she has been doing pretty well.  She is quite busy at work.  She is also quite busy with her grandson.  He is 31 months old.  She has had no abdominal pain.  Her last Inhibin B level is actually better at 28.  She has had no cough or shortness of breath.  She has had no leg swelling.  She says at the end of the day, she has some swelling about her ankles.  There is been no bleeding.  Her last iron studies back in March showed a ferritin of 39 with an iron saturation of 19%.  We did go ahead and give her a dose of IV iron.  She has had no issues with fever.  Thankfully, there is been no problems with COVID.  She has had no headache.  She continues on Coumadin because of a mechanical heart valve.  Cardiology is managing the anticoagulation level.  Overall, I would say performance status is probably ECOG 1.     Medications:  Allergies as of 11/30/2021       Reactions   Augmentin [amoxicillin-pot Clavulanate] Other (See Comments)   Muscle Aches   Prochlorperazine Edisylate Other (See Comments)   Nervous/ flutter/ shakes   Adhesive [tape] Hives   Redness/hives from adhesive tape( tolerates latex gloves); tolerates paper tape        Medication List        Accurate as of November 30, 2021 12:43 PM. If you have any questions, ask your nurse or doctor.          albuterol 108 (90 Base) MCG/ACT inhaler Commonly known as: VENTOLIN HFA TAKE 2 PUFFS BY MOUTH EVERY 6 HOURS AS  NEEDED FOR WHEEZE OR SHORTNESS OF BREATH   amoxicillin 500 MG tablet Commonly known as: AMOXIL Take 2,000 mg by mouth See admin instructions. Take 2,000 mg one hour prior to dental visits.   aspirin EC 81 MG tablet Take 81 mg by mouth daily.   b complex vitamins capsule Take 1 capsule by mouth 2 (two) times daily.   carvedilol 3.125 MG tablet Commonly known as: COREG Take 1 tablet (3.125 mg total) by mouth 2 (two) times daily with a meal.   cholecalciferol 25 MCG (1000 UNIT) tablet Commonly known as: VITAMIN D3 Take 4,000 Units by mouth daily.   furosemide 20 MG tablet Commonly known as: LASIX Take 1 tablet (20 mg total) by mouth daily.   lidocaine-prilocaine cream Commonly known as: EMLA Apply 1 application topically as needed. Apply to port per instructions   loperamide 2 MG tablet Commonly known as: IMODIUM A-D Take 4 mg by mouth 3 (three) times daily as needed for diarrhea or loose stools.   LORazepam 0.5 MG tablet Commonly known as: ATIVAN TAKE ONE TABLET BY MOUTH EVERY 8 HOURS   methocarbamol 500 MG tablet Commonly known as: ROBAXIN TAKE ONE TABLET BY MOUTH EVERY 6 HOURS AS NEEDED FOR MUSCLE SPASMS   Potassium Chloride ER 20 MEQ Tbcr Take  20 mEq by mouth daily.   spironolactone 25 MG tablet Commonly known as: ALDACTONE Take 1 tablet (25 mg total) by mouth daily.   traMADol 50 MG tablet Commonly known as: ULTRAM TAKE ONE TABLET BY MOUTH EVERY 6 HOURS AS NEEDED   Vitamin D (Ergocalciferol) 1.25 MG (50000 UNIT) Caps capsule Commonly known as: DRISDOL TAKE ONE CAPSULE BY MOUTH EVERY 7 DAYS   warfarin 7.5 MG tablet Commonly known as: COUMADIN Take as directed by the anticoagulation clinic. If you are unsure how to take this medication, talk to your nurse or doctor. Original instructions: Take 1 tablet by mouth daily except 1/2 tablet on Monday and Fridays or as directed by Anticoagulation Clinic.   zolpidem 10 MG tablet Commonly known as: AMBIEN TAKE ONE  TABLET BY MOUTH AT BEDTIME        Allergies:  Allergies  Allergen Reactions   Augmentin [Amoxicillin-Pot Clavulanate] Other (See Comments)    Muscle Aches   Prochlorperazine Edisylate Other (See Comments)    Nervous/ flutter/ shakes   Adhesive [Tape] Hives    Redness/hives from adhesive tape( tolerates latex gloves); tolerates paper tape    Past Medical History, Surgical history, Social history, and Family History were reviewed and updated.  Review of Systems:  Review of Systems  Constitutional: Negative.   HENT: Negative.    Eyes: Negative.   Respiratory: Negative.    Cardiovascular: Negative.   Gastrointestinal: Negative.   Genitourinary: Negative.   Musculoskeletal: Negative.   Skin: Negative.   Neurological: Negative.   Endo/Heme/Allergies: Negative.   Psychiatric/Behavioral: Negative.        Exam:  weight is 176 lb (79.8 kg). Her oral temperature is 98.2 F (36.8 C). Her blood pressure is 108/59 (abnormal) and her pulse is 71. Her respiration is 16 and oxygen saturation is 99%.   Wt Readings from Last 3 Encounters:  11/30/21 176 lb (79.8 kg)  09/03/21 179 lb 3.2 oz (81.3 kg)  08/31/21 177 lb (80.3 kg)    Physical Exam Vitals reviewed.  HENT:     Head: Normocephalic and atraumatic.  Eyes:     Pupils: Pupils are equal, round, and reactive to light.  Cardiovascular:     Rate and Rhythm: Normal rate and regular rhythm.     Heart sounds: Normal heart sounds.  Pulmonary:     Effort: Pulmonary effort is normal.     Breath sounds: Normal breath sounds.  Abdominal:     General: Bowel sounds are normal.     Palpations: Abdomen is soft.  Musculoskeletal:        General: No tenderness or deformity. Normal range of motion.     Cervical back: Normal range of motion.  Lymphadenopathy:     Cervical: No cervical adenopathy.  Skin:    General: Skin is warm and dry.     Findings: No erythema or rash.  Neurological:     Mental Status: She is alert and oriented  to person, place, and time.  Psychiatric:        Behavior: Behavior normal.        Thought Content: Thought content normal.        Judgment: Judgment normal.      Lab Results  Component Value Date   WBC 7.4 11/30/2021   HGB 11.6 (L) 11/30/2021   HCT 33.8 (L) 11/30/2021   MCV 93.1 11/30/2021   PLT 168 11/30/2021   Lab Results  Component Value Date   FERRITIN 39 08/30/2021   IRON 69  08/30/2021   TIBC 356 08/30/2021   UIBC 287 08/30/2021   IRONPCTSAT 19 08/30/2021   Lab Results  Component Value Date   RETICCTPCT 2.2 08/16/2020   RBC 3.63 (L) 11/30/2021   No results found for: "KPAFRELGTCHN", "LAMBDASER", "KAPLAMBRATIO" No results found for: "IGGSERUM", "IGA", "IGMSERUM" No results found for: "TOTALPROTELP", "ALBUMINELP", "A1GS", "A2GS", "BETS", "BETA2SER", "GAMS", "MSPIKE", "SPEI"   Chemistry      Component Value Date/Time   NA 137 08/30/2021 1530   NA 139 08/14/2020 1428   NA 140 03/15/2016 0953   K 3.4 (L) 08/30/2021 1530   K 3.9 03/15/2016 0953   CL 100 08/30/2021 1530   CL 102 12/13/2015 0803   CO2 27 08/30/2021 1530   CO2 19 (L) 03/15/2016 0953   BUN 16 08/30/2021 1530   BUN 16 08/14/2020 1428   BUN 15.9 03/15/2016 0953   CREATININE 1.14 (H) 08/30/2021 1530   CREATININE 1.4 (H) 03/15/2016 0953      Component Value Date/Time   CALCIUM 8.7 (L) 08/30/2021 1530   CALCIUM 9.3 03/15/2016 0953   ALKPHOS 37 (L) 08/30/2021 1530   ALKPHOS 55 03/15/2016 0953   AST 20 08/30/2021 1530   AST 17 03/15/2016 0953   ALT 12 08/30/2021 1530   ALT 23 03/15/2016 0953   BILITOT 0.5 08/30/2021 1530   BILITOT 0.38 03/15/2016 0953       Impression and Plan: Ms. Barillas is a very pleasant 61 yo caucasian female with recurrent granulosa cell tumor.  We will have to see with the Inhibin B level is.  This will be critical.  If this is significantly elevated, we can certainly do another CT scan on her.  As far as any further treatments, I think that we do not have a lot of  options. She is not had any immunotherapy.  I think if we do have to treat her, we may need to think about getting a biopsy and trying to send off for molecular analysis.  I am just happy that her quality of life is doing so well right now.  I am happy that she is able to enjoy her grandson.  We will plan for another follow-up after Summer.  If there is any problems before then, she will let us know.   Volanda Napoleon, MD 6/9/202312:43 PM

## 2021-12-03 ENCOUNTER — Telehealth: Payer: Self-pay | Admitting: Hematology & Oncology

## 2021-12-03 ENCOUNTER — Telehealth: Payer: Self-pay | Admitting: *Deleted

## 2021-12-03 LAB — INHIBIN B: Inhibin B: 61.3 pg/mL — ABNORMAL HIGH (ref 0.0–16.9)

## 2021-12-03 NOTE — Telephone Encounter (Signed)
-----   Message from Volanda Napoleon, MD sent at 12/03/2021  4:28 PM EDT ----- Call let him know that the inhibin B level is actually up to 61.  If she wants to do a CT scan we can do this.  She just has to let us know.  I know that she has been somewhat resilient to doing 1 in the past.  Thanks.  [name]

## 2021-12-03 NOTE — Telephone Encounter (Signed)
As noted below by Dr. Marin Olp, I left a message informing the patient that the Inhibin B level is actually up to 61. If you would like to do a CT scan now, we can do this. Please call the office if you would like one.

## 2021-12-03 NOTE — Telephone Encounter (Signed)
Called to schedule 1 dose of iron per 6/12 sch msg , left voicemail to call us back to schedule

## 2021-12-05 NOTE — Telephone Encounter (Signed)
Patient called and stated,"I would like to October to get a CT Scan done." I  told her I would give this message to Dr. Marin Olp. She verbalized understanding.

## 2021-12-06 ENCOUNTER — Other Ambulatory Visit: Payer: Self-pay | Admitting: Hematology & Oncology

## 2021-12-06 DIAGNOSIS — F419 Anxiety disorder, unspecified: Secondary | ICD-10-CM

## 2021-12-06 DIAGNOSIS — D3910 Neoplasm of uncertain behavior of unspecified ovary: Secondary | ICD-10-CM

## 2021-12-07 ENCOUNTER — Ambulatory Visit (INDEPENDENT_AMBULATORY_CARE_PROVIDER_SITE_OTHER): Payer: 59 | Admitting: *Deleted

## 2021-12-07 DIAGNOSIS — Z952 Presence of prosthetic heart valve: Secondary | ICD-10-CM

## 2021-12-07 DIAGNOSIS — I480 Paroxysmal atrial fibrillation: Secondary | ICD-10-CM | POA: Diagnosis not present

## 2021-12-07 DIAGNOSIS — Z953 Presence of xenogenic heart valve: Secondary | ICD-10-CM | POA: Diagnosis not present

## 2021-12-07 LAB — POCT INR: INR: 2.2 (ref 2.0–3.0)

## 2021-12-07 NOTE — Patient Instructions (Addendum)
Description   DOES NOT NEED A PRINTOUT. Continue taking warfarin 1 tablet daily except for 1/2 tablet on Monday, Wednesday and Friday. Recheck INR in 4 weeks. [facility] [phone]

## 2021-12-11 ENCOUNTER — Inpatient Hospital Stay: Payer: 59

## 2021-12-11 ENCOUNTER — Other Ambulatory Visit: Payer: Self-pay | Admitting: Hematology & Oncology

## 2021-12-11 VITALS — BP 109/56 | HR 67 | Temp 98.3°F | Resp 17

## 2021-12-11 DIAGNOSIS — D3910 Neoplasm of uncertain behavior of unspecified ovary: Secondary | ICD-10-CM

## 2021-12-11 DIAGNOSIS — T451X5A Adverse effect of antineoplastic and immunosuppressive drugs, initial encounter: Secondary | ICD-10-CM

## 2021-12-11 DIAGNOSIS — D508 Other iron deficiency anemias: Secondary | ICD-10-CM | POA: Diagnosis not present

## 2021-12-11 MED ORDER — HEPARIN SOD (PORK) LOCK FLUSH 100 UNIT/ML IV SOLN
500.0000 [IU] | Freq: Once | INTRAVENOUS | Status: AC
  Administered 2021-12-11: 500 [IU] via INTRAVENOUS

## 2021-12-11 MED ORDER — SODIUM CHLORIDE 0.9% FLUSH
10.0000 mL | INTRAVENOUS | Status: DC | PRN
  Administered 2021-12-11: 10 mL via INTRAVENOUS

## 2021-12-11 MED ORDER — SODIUM CHLORIDE 0.9 % IV SOLN: INTRAVENOUS | Status: DC

## 2021-12-11 MED ORDER — SODIUM CHLORIDE 0.9 % IV SOLN
125.0000 mg | Freq: Once | INTRAVENOUS | Status: AC
  Administered 2021-12-11: 125 mg via INTRAVENOUS
  Filled 2021-12-11: qty 125

## 2021-12-11 NOTE — Patient Instructions (Signed)
Sodium Ferric Gluconate Complex Injection What is this medication? SODIUM FERRIC GLUCONATE COMPLEX (SOE dee um FER ik GLOO koe nate KOM pleks) treats low levels of iron (iron deficiency anemia) in people with kidney disease. Iron is a mineral that plays an important role in making red blood cells, which carry oxygen from your lungs to the rest of your body. This medicine may be used for other purposes; ask your health care provider or pharmacist if you have questions. COMMON BRAND NAME(S): Ferrlecit, Nulecit What should I tell my care team before I take this medication? They need to know if you have any of the following conditions: Anemia that is not from iron deficiency High levels of iron in the blood An unusual or allergic reaction to iron, other medications, foods, dyes, or preservatives Pregnant or are trying to become pregnant Breast-feeding How should I use this medication? This medication is injected into a vein. It is given by your care team in a hospital or clinic setting. Talk to your care team about the use of this medication in children. While it may be prescribed for children as young as 6 years for selected conditions, precautions do apply. Overdosage: If you think you have taken too much of this medicine contact a poison control center or emergency room at once. NOTE: This medicine is only for you. Do not share this medicine with others. What if I miss a dose? It is important not to miss your dose. Call your care team if you are unable to keep an appointment. What may interact with this medication? Do not take this medication with any of the following: Deferasirox Deferoxamine Dimercaprol This medication may also interact with the following: Other iron products This list may not describe all possible interactions. Give your health care provider a list of all the medicines, herbs, non-prescription drugs, or dietary supplements you use. Also tell them if you smoke, drink  alcohol, or use illegal drugs. Some items may interact with your medicine. What should I watch for while using this medication? Your condition will be monitored carefully while you are receiving this medication. Visit your care team for regular checks on your progress. You may need blood work while you are taking this medication. What side effects may I notice from receiving this medication? Side effects that you should report to your care team as soon as possible: Allergic reactions--skin rash, itching, hives, swelling of the face, lips, tongue, or throat Low blood pressure--dizziness, feeling faint or lightheaded, blurry vision Shortness of breath Side effects that usually do not require medical attention (report to your care team if they continue or are bothersome): Flushing Headache Joint pain Muscle pain Nausea Pain, redness, or irritation at injection site This list may not describe all possible side effects. Call your doctor for medical advice about side effects. You may report side effects to FDA at 1-800-FDA-1088. Where should I keep my medication? This medication is given in a hospital or clinic and will not be stored at home. NOTE: This sheet is a summary. It may not cover all possible information. If you have questions about this medicine, talk to your doctor, pharmacist, or health care provider.  2023 Elsevier/Gold Standard (2020-11-03 00:00:00)  

## 2021-12-26 ENCOUNTER — Other Ambulatory Visit: Payer: Self-pay | Admitting: Hematology & Oncology

## 2021-12-26 DIAGNOSIS — F419 Anxiety disorder, unspecified: Secondary | ICD-10-CM

## 2022-01-01 ENCOUNTER — Ambulatory Visit (INDEPENDENT_AMBULATORY_CARE_PROVIDER_SITE_OTHER): Payer: 59

## 2022-01-01 DIAGNOSIS — I255 Ischemic cardiomyopathy: Secondary | ICD-10-CM | POA: Diagnosis not present

## 2022-01-03 LAB — CUP PACEART REMOTE DEVICE CHECK
Battery Remaining Longevity: 87 mo
Battery Remaining Percentage: 82 %
Battery Voltage: 2.99 V
Brady Statistic AP VP Percent: 1 %
Brady Statistic AP VS Percent: 1.4 %
Brady Statistic AS VP Percent: 1 %
Brady Statistic AS VS Percent: 98 %
Brady Statistic RA Percent Paced: 1 %
Brady Statistic RV Percent Paced: 1 %
Date Time Interrogation Session: 20230713080546
HighPow Impedance: 68 Ohm
Implantable Lead Implant Date: 20110503
Implantable Lead Implant Date: 20130815
Implantable Lead Location: 753859
Implantable Lead Location: 753860
Implantable Pulse Generator Implant Date: 20211012
Lead Channel Impedance Value: 260 Ohm
Lead Channel Impedance Value: 390 Ohm
Lead Channel Pacing Threshold Amplitude: 0.5 V
Lead Channel Pacing Threshold Amplitude: 1.625 V
Lead Channel Pacing Threshold Pulse Width: 0.5 ms
Lead Channel Pacing Threshold Pulse Width: 1 ms
Lead Channel Sensing Intrinsic Amplitude: 12 mV
Lead Channel Sensing Intrinsic Amplitude: 2.1 mV
Lead Channel Setting Pacing Amplitude: 1.875
Lead Channel Setting Pacing Amplitude: 2 V
Lead Channel Setting Pacing Pulse Width: 1 ms
Lead Channel Setting Sensing Sensitivity: 0.5 mV
Pulse Gen Serial Number: 111028151

## 2022-01-04 ENCOUNTER — Other Ambulatory Visit: Payer: Self-pay | Admitting: Hematology & Oncology

## 2022-01-04 DIAGNOSIS — D3910 Neoplasm of uncertain behavior of unspecified ovary: Secondary | ICD-10-CM

## 2022-01-07 ENCOUNTER — Other Ambulatory Visit: Payer: Self-pay | Admitting: Hematology & Oncology

## 2022-01-07 DIAGNOSIS — D3910 Neoplasm of uncertain behavior of unspecified ovary: Secondary | ICD-10-CM

## 2022-01-10 ENCOUNTER — Ambulatory Visit (INDEPENDENT_AMBULATORY_CARE_PROVIDER_SITE_OTHER): Payer: 59 | Admitting: *Deleted

## 2022-01-10 DIAGNOSIS — Z5181 Encounter for therapeutic drug level monitoring: Secondary | ICD-10-CM

## 2022-01-10 DIAGNOSIS — Z953 Presence of xenogenic heart valve: Secondary | ICD-10-CM | POA: Diagnosis not present

## 2022-01-10 DIAGNOSIS — Z952 Presence of prosthetic heart valve: Secondary | ICD-10-CM

## 2022-01-10 DIAGNOSIS — I480 Paroxysmal atrial fibrillation: Secondary | ICD-10-CM | POA: Diagnosis not present

## 2022-01-10 LAB — POCT INR: INR: 2.9 (ref 2.0–3.0)

## 2022-01-10 NOTE — Patient Instructions (Signed)
Description   DOES NOT NEED A PRINTOUT. Continue taking warfarin 1 tablet daily except for 1/2 tablet on Monday, Wednesday and Friday. Recheck INR in 5 weeks. [facility] [phone]

## 2022-01-15 ENCOUNTER — Other Ambulatory Visit: Payer: Self-pay | Admitting: Hematology & Oncology

## 2022-01-15 DIAGNOSIS — F419 Anxiety disorder, unspecified: Secondary | ICD-10-CM

## 2022-01-23 NOTE — Progress Notes (Signed)
Remote ICD transmission.   

## 2022-01-28 ENCOUNTER — Other Ambulatory Visit: Payer: Self-pay | Admitting: Hematology & Oncology

## 2022-01-29 ENCOUNTER — Encounter: Payer: Self-pay | Admitting: Hematology & Oncology

## 2022-01-31 ENCOUNTER — Other Ambulatory Visit: Payer: Self-pay | Admitting: Hematology & Oncology

## 2022-01-31 DIAGNOSIS — D3910 Neoplasm of uncertain behavior of unspecified ovary: Secondary | ICD-10-CM

## 2022-02-04 ENCOUNTER — Other Ambulatory Visit: Payer: Self-pay | Admitting: Hematology & Oncology

## 2022-02-04 DIAGNOSIS — F419 Anxiety disorder, unspecified: Secondary | ICD-10-CM

## 2022-02-04 DIAGNOSIS — D3910 Neoplasm of uncertain behavior of unspecified ovary: Secondary | ICD-10-CM

## 2022-02-18 ENCOUNTER — Other Ambulatory Visit: Payer: Self-pay | Admitting: Hematology & Oncology

## 2022-02-21 ENCOUNTER — Ambulatory Visit: Payer: 59 | Attending: Cardiology | Admitting: *Deleted

## 2022-02-21 DIAGNOSIS — Z952 Presence of prosthetic heart valve: Secondary | ICD-10-CM

## 2022-02-21 DIAGNOSIS — I480 Paroxysmal atrial fibrillation: Secondary | ICD-10-CM

## 2022-02-21 DIAGNOSIS — Z953 Presence of xenogenic heart valve: Secondary | ICD-10-CM

## 2022-02-21 LAB — POCT INR: INR: 3 (ref 2.0–3.0)

## 2022-02-27 ENCOUNTER — Other Ambulatory Visit: Payer: Self-pay | Admitting: Hematology & Oncology

## 2022-02-27 DIAGNOSIS — F419 Anxiety disorder, unspecified: Secondary | ICD-10-CM

## 2022-02-27 DIAGNOSIS — D3910 Neoplasm of uncertain behavior of unspecified ovary: Secondary | ICD-10-CM

## 2022-03-06 ENCOUNTER — Other Ambulatory Visit: Payer: Self-pay | Admitting: Hematology & Oncology

## 2022-03-06 DIAGNOSIS — D3910 Neoplasm of uncertain behavior of unspecified ovary: Secondary | ICD-10-CM

## 2022-03-08 ENCOUNTER — Other Ambulatory Visit: Payer: Self-pay

## 2022-03-08 ENCOUNTER — Inpatient Hospital Stay: Payer: 59 | Attending: Hematology & Oncology

## 2022-03-08 ENCOUNTER — Inpatient Hospital Stay (HOSPITAL_BASED_OUTPATIENT_CLINIC_OR_DEPARTMENT_OTHER): Payer: 59 | Admitting: Hematology & Oncology

## 2022-03-08 ENCOUNTER — Encounter: Payer: Self-pay | Admitting: Hematology & Oncology

## 2022-03-08 ENCOUNTER — Inpatient Hospital Stay: Payer: 59

## 2022-03-08 VITALS — BP 103/63 | HR 82 | Temp 98.0°F | Resp 18 | Ht 66.0 in | Wt 172.0 lb

## 2022-03-08 DIAGNOSIS — Z79811 Long term (current) use of aromatase inhibitors: Secondary | ICD-10-CM | POA: Insufficient documentation

## 2022-03-08 DIAGNOSIS — I480 Paroxysmal atrial fibrillation: Secondary | ICD-10-CM | POA: Diagnosis not present

## 2022-03-08 DIAGNOSIS — D500 Iron deficiency anemia secondary to blood loss (chronic): Secondary | ICD-10-CM | POA: Diagnosis not present

## 2022-03-08 DIAGNOSIS — D508 Other iron deficiency anemias: Secondary | ICD-10-CM | POA: Diagnosis present

## 2022-03-08 DIAGNOSIS — Z8674 Personal history of sudden cardiac arrest: Secondary | ICD-10-CM | POA: Diagnosis not present

## 2022-03-08 DIAGNOSIS — K909 Intestinal malabsorption, unspecified: Secondary | ICD-10-CM | POA: Diagnosis present

## 2022-03-08 DIAGNOSIS — E039 Hypothyroidism, unspecified: Secondary | ICD-10-CM | POA: Diagnosis not present

## 2022-03-08 DIAGNOSIS — Z79899 Other long term (current) drug therapy: Secondary | ICD-10-CM | POA: Insufficient documentation

## 2022-03-08 DIAGNOSIS — Z95828 Presence of other vascular implants and grafts: Secondary | ICD-10-CM

## 2022-03-08 DIAGNOSIS — Z923 Personal history of irradiation: Secondary | ICD-10-CM | POA: Diagnosis not present

## 2022-03-08 DIAGNOSIS — Z7901 Long term (current) use of anticoagulants: Secondary | ICD-10-CM | POA: Diagnosis not present

## 2022-03-08 DIAGNOSIS — I2510 Atherosclerotic heart disease of native coronary artery without angina pectoris: Secondary | ICD-10-CM | POA: Diagnosis not present

## 2022-03-08 DIAGNOSIS — D3910 Neoplasm of uncertain behavior of unspecified ovary: Secondary | ICD-10-CM

## 2022-03-08 DIAGNOSIS — I5022 Chronic systolic (congestive) heart failure: Secondary | ICD-10-CM | POA: Diagnosis not present

## 2022-03-08 DIAGNOSIS — Z95810 Presence of automatic (implantable) cardiac defibrillator: Secondary | ICD-10-CM | POA: Diagnosis not present

## 2022-03-08 DIAGNOSIS — Z952 Presence of prosthetic heart valve: Secondary | ICD-10-CM | POA: Insufficient documentation

## 2022-03-08 DIAGNOSIS — I252 Old myocardial infarction: Secondary | ICD-10-CM | POA: Diagnosis not present

## 2022-03-08 DIAGNOSIS — E7800 Pure hypercholesterolemia, unspecified: Secondary | ICD-10-CM | POA: Diagnosis not present

## 2022-03-08 DIAGNOSIS — Z9221 Personal history of antineoplastic chemotherapy: Secondary | ICD-10-CM | POA: Diagnosis not present

## 2022-03-08 DIAGNOSIS — I471 Supraventricular tachycardia: Secondary | ICD-10-CM | POA: Diagnosis not present

## 2022-03-08 DIAGNOSIS — C569 Malignant neoplasm of unspecified ovary: Secondary | ICD-10-CM | POA: Insufficient documentation

## 2022-03-08 DIAGNOSIS — N183 Chronic kidney disease, stage 3 unspecified: Secondary | ICD-10-CM | POA: Diagnosis not present

## 2022-03-08 DIAGNOSIS — I255 Ischemic cardiomyopathy: Secondary | ICD-10-CM | POA: Diagnosis not present

## 2022-03-08 LAB — CMP (CANCER CENTER ONLY)
ALT: 11 U/L (ref 0–44)
AST: 16 U/L (ref 15–41)
Albumin: 4.1 g/dL (ref 3.5–5.0)
Alkaline Phosphatase: 43 U/L (ref 38–126)
Anion gap: 10 (ref 5–15)
BUN: 12 mg/dL (ref 8–23)
CO2: 27 mmol/L (ref 22–32)
Calcium: 9.5 mg/dL (ref 8.9–10.3)
Chloride: 99 mmol/L (ref 98–111)
Creatinine: 1.11 mg/dL — ABNORMAL HIGH (ref 0.44–1.00)
GFR, Estimated: 56 mL/min — ABNORMAL LOW (ref >60)
Glucose, Bld: 93 mg/dL (ref 70–99)
Potassium: 3.3 mmol/L — ABNORMAL LOW (ref 3.5–5.1)
Sodium: 136 mmol/L (ref 135–145)
Total Bilirubin: 0.4 mg/dL (ref 0.3–1.2)
Total Protein: 6.7 g/dL (ref 6.5–8.1)

## 2022-03-08 LAB — CBC WITH DIFFERENTIAL (CANCER CENTER ONLY)
Abs Immature Granulocytes: 0.04 10*3/uL (ref 0.00–0.07)
Basophils Absolute: 0 10*3/uL (ref 0.0–0.1)
Basophils Relative: 0 %
Eosinophils Absolute: 0.1 10*3/uL (ref 0.0–0.5)
Eosinophils Relative: 1 %
HCT: 39.1 % (ref 36.0–46.0)
Hemoglobin: 13.2 g/dL (ref 12.0–15.0)
Immature Granulocytes: 1 %
Lymphocytes Relative: 8 %
Lymphs Abs: 0.7 10*3/uL (ref 0.7–4.0)
MCH: 31.1 pg (ref 26.0–34.0)
MCHC: 33.8 g/dL (ref 30.0–36.0)
MCV: 92.2 fL (ref 80.0–100.0)
Monocytes Absolute: 0.8 10*3/uL (ref 0.1–1.0)
Monocytes Relative: 10 %
Neutro Abs: 6.5 10*3/uL (ref 1.7–7.7)
Neutrophils Relative %: 80 %
Platelet Count: 160 10*3/uL (ref 150–400)
RBC: 4.24 MIL/uL (ref 3.87–5.11)
RDW: 12.4 % (ref 11.5–15.5)
WBC Count: 8.2 10*3/uL (ref 4.0–10.5)
nRBC: 0 % (ref 0.0–0.2)

## 2022-03-08 LAB — IRON AND IRON BINDING CAPACITY (CC-WL,HP ONLY)
Iron: 84 ug/dL (ref 28–170)
Saturation Ratios: 23 % (ref 10.4–31.8)
TIBC: 360 ug/dL (ref 250–450)
UIBC: 276 ug/dL

## 2022-03-08 LAB — LACTATE DEHYDROGENASE: LDH: 155 U/L (ref 98–192)

## 2022-03-08 LAB — FERRITIN: Ferritin: 25 ng/mL (ref 11–307)

## 2022-03-08 MED ORDER — SODIUM CHLORIDE 0.9% FLUSH
10.0000 mL | Freq: Once | INTRAVENOUS | Status: AC
  Administered 2022-03-08: 10 mL via INTRAVENOUS

## 2022-03-08 MED ORDER — HEPARIN SOD (PORK) LOCK FLUSH 100 UNIT/ML IV SOLN
500.0000 [IU] | Freq: Once | INTRAVENOUS | Status: AC
  Administered 2022-03-08: 500 [IU] via INTRAVENOUS

## 2022-03-08 NOTE — Progress Notes (Signed)
Hematology and Oncology Follow Up Visit  [name] [id number] 1960-12-23 61 y.o. 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  She is doing pretty well.  She really has had no complaints since we last saw her.  She is still working.  She is quite busy at work.  Her grandson is doing well.  He is getting bigger.  I saw a nice video of him.  There is been no problems with nausea or vomiting.  She has had no problems with her heart.  She is on Coumadin.  This is being managed by cardiology.  She has had no change in bowel or bladder habits.  Her last Inhibin B was elevated at 68.  We will have to see what this level is.  If it the level is higher, we will have to do a CT scan to see how everything looks.  She has had no problems with leg swelling.  We do follow her for iron deficiency.  When we saw her, her ferritin was 23 with an iron saturation of 18%.    Currently, I would say performance status is probably ECOG 1.     Medications:  Allergies as of 03/08/2022       Reactions   Augmentin [amoxicillin-pot Clavulanate] Other (See Comments)   Muscle Aches   Prochlorperazine Edisylate Other (See Comments)   Nervous/ flutter/ shakes   Adhesive [tape] Hives   Redness/hives from adhesive tape( tolerates latex gloves); tolerates paper tape        Medication List        Accurate as of March 08, 2022 12:23 PM. If you have any questions, ask your nurse or doctor.          albuterol 108 (90 Base) MCG/ACT inhaler Commonly known as: VENTOLIN HFA TAKE 2 PUFFS BY MOUTH EVERY 6 HOURS AS NEEDED FOR WHEEZE OR SHORTNESS OF BREATH    amoxicillin 500 MG tablet Commonly known as: AMOXIL Take 2,000 mg by mouth See admin instructions. Take 2,000 mg one hour prior to dental visits.   aspirin EC 81 MG tablet Take 81 mg by mouth daily.   b complex vitamins capsule Take 1 capsule by mouth 2 (two) times daily.   carvedilol 3.125 MG tablet Commonly known as: COREG Take 1 tablet (3.125 mg total) by mouth 2 (two) times daily with a meal.   cholecalciferol 25 MCG (1000 UNIT) tablet Commonly known as: VITAMIN D3 Take 4,000 Units by mouth daily.   furosemide 20 MG tablet Commonly known as: LASIX Take 1 tablet (20 mg total) by mouth daily.   lidocaine-prilocaine cream Commonly known as: EMLA Apply 1 application topically as needed. Apply to port per instructions   loperamide 2 MG tablet Commonly known as: IMODIUM A-D Take 4 mg by mouth 3 (three) times daily as needed for diarrhea or loose stools.   LORazepam 0.5 MG tablet Commonly known as: ATIVAN TAKE ONE TABLET BY MOUTH EVERY 8 HOURS   methocarbamol 500 MG tablet Commonly known as: ROBAXIN TAKE ONE TABLET BY MOUTH EVERY 6 HOURS AS NEEDED FOR MUSCLE SPASMS   Potassium Chloride ER 20 MEQ Tbcr Take 20 mEq by  mouth daily.   spironolactone 25 MG tablet Commonly known as: ALDACTONE Take 1 tablet (25 mg total) by mouth daily.   traMADol 50 MG tablet Commonly known as: ULTRAM TAKE ONE TABLET BY MOUTH EVERY 6 HOURS AS NEEDED   Vitamin D (Ergocalciferol) 1.25 MG (50000 UNIT) Caps capsule Commonly known as: DRISDOL TAKE ONE CAPSULE BY MOUTH EVERY 7 DAYS   warfarin 7.5 MG tablet Commonly known as: COUMADIN Take as directed by the anticoagulation clinic. If you are unsure how to take this medication, talk to your nurse or doctor. Original instructions: Take 1 tablet by mouth daily except 1/2 tablet on Monday and Fridays or as directed by Anticoagulation Clinic.   zolpidem 10 MG tablet Commonly known as: AMBIEN TAKE ONE TABLET BY MOUTH AT BEDTIME         Allergies:  Allergies  Allergen Reactions   Augmentin [Amoxicillin-Pot Clavulanate] Other (See Comments)    Muscle Aches   Prochlorperazine Edisylate Other (See Comments)    Nervous/ flutter/ shakes   Adhesive [Tape] Hives    Redness/hives from adhesive tape( tolerates latex gloves); tolerates paper tape    Past Medical History, Surgical history, Social history, and Family History were reviewed and updated.  Review of Systems:  Review of Systems  Constitutional: Negative.   HENT: Negative.    Eyes: Negative.   Respiratory: Negative.    Cardiovascular: Negative.   Gastrointestinal: Negative.   Genitourinary: Negative.   Musculoskeletal: Negative.   Skin: Negative.   Neurological: Negative.   Endo/Heme/Allergies: Negative.   Psychiatric/Behavioral: Negative.        Exam:  height is '5\' 6"'$  (1.676 m) and weight is 172 lb (78 kg). Her oral temperature is 98 F (36.7 C). Her blood pressure is 103/63 and her pulse is 82. Her respiration is 18 and oxygen saturation is 97%.   Wt Readings from Last 3 Encounters:  03/08/22 172 lb (78 kg)  11/30/21 176 lb (79.8 kg)  09/03/21 179 lb 3.2 oz (81.3 kg)    Physical Exam Vitals reviewed.  HENT:     Head: Normocephalic and atraumatic.  Eyes:     Pupils: Pupils are equal, round, and reactive to light.  Cardiovascular:     Rate and Rhythm: Normal rate and regular rhythm.     Heart sounds: Normal heart sounds.  Pulmonary:     Effort: Pulmonary effort is normal.     Breath sounds: Normal breath sounds.  Abdominal:     General: Bowel sounds are normal.     Palpations: Abdomen is soft.  Musculoskeletal:        General: No tenderness or deformity. Normal range of motion.     Cervical back: Normal range of motion.  Lymphadenopathy:     Cervical: No cervical adenopathy.  Skin:    General: Skin is warm and dry.     Findings: No erythema or rash.  Neurological:     Mental Status: She is alert and oriented to person, place, and  time.  Psychiatric:        Behavior: Behavior normal.        Thought Content: Thought content normal.        Judgment: Judgment normal.     Lab Results  Component Value Date   WBC 8.2 03/08/2022   HGB 13.2 03/08/2022   HCT 39.1 03/08/2022   MCV 92.2 03/08/2022   PLT 160 03/08/2022   Lab Results  Component Value Date   FERRITIN 23 11/30/2021   IRON 63  11/30/2021   TIBC 356 11/30/2021   UIBC 293 11/30/2021   IRONPCTSAT 18 11/30/2021   Lab Results  Component Value Date   RETICCTPCT 2.2 08/16/2020   RBC 4.24 03/08/2022   No results found for: "KPAFRELGTCHN", "LAMBDASER", "KAPLAMBRATIO" No results found for: "IGGSERUM", "IGA", "IGMSERUM" No results found for: "TOTALPROTELP", "ALBUMINELP", "A1GS", "A2GS", "BETS", "BETA2SER", "GAMS", "MSPIKE", "SPEI"   Chemistry      Component Value Date/Time   NA 139 11/30/2021 1215   NA 139 08/14/2020 1428   NA 140 03/15/2016 0953   K 3.2 (L) 11/30/2021 1215   K 3.9 03/15/2016 0953   CL 101 11/30/2021 1215   CL 102 12/13/2015 0803   CO2 29 11/30/2021 1215   CO2 19 (L) 03/15/2016 0953   BUN 14 11/30/2021 1215   BUN 16 08/14/2020 1428   BUN 15.9 03/15/2016 0953   CREATININE 1.23 (H) 11/30/2021 1215   CREATININE 1.4 (H) 03/15/2016 0953      Component Value Date/Time   CALCIUM 9.4 11/30/2021 1215   CALCIUM 9.3 03/15/2016 0953   ALKPHOS 36 (L) 11/30/2021 1215   ALKPHOS 55 03/15/2016 0953   AST 14 (L) 11/30/2021 1215   AST 17 03/15/2016 0953   ALT 8 11/30/2021 1215   ALT 23 03/15/2016 0953   BILITOT 0.4 11/30/2021 1215   BILITOT 0.38 03/15/2016 0953       Impression and Plan: Ms. Iglesia is a very pleasant 61 yo caucasian female with recurrent granulosa cell tumor.  We will have to see with the Inhibin B level is.  This will be critical.  If this is significantly elevated, we can certainly do another CT scan on her.  As far as any further treatments, I think that we do not have a lot of options. She is not had any immunotherapy.   We could certainly consider treating her with what she had before.  She has not had any therapy now for several years.  Her quality life still is quite good.  As such, I think we still have to be cognizant of her quality of life.  We will plan to get her back to see [place] depending on what the Inhibin B level is.   Volanda Napoleon, MD 9/15/202312:23 PM

## 2022-03-08 NOTE — Patient Instructions (Signed)

## 2022-03-11 ENCOUNTER — Other Ambulatory Visit: Payer: Self-pay | Admitting: Hematology & Oncology

## 2022-03-11 ENCOUNTER — Encounter: Payer: Self-pay | Admitting: *Deleted

## 2022-03-11 DIAGNOSIS — D3910 Neoplasm of uncertain behavior of unspecified ovary: Secondary | ICD-10-CM

## 2022-03-11 LAB — INHIBIN B: Inhibin B: 108.2 pg/mL — ABNORMAL HIGH (ref 0.0–16.9)

## 2022-03-12 ENCOUNTER — Encounter: Payer: Self-pay | Admitting: *Deleted

## 2022-03-14 ENCOUNTER — Telehealth (HOSPITAL_BASED_OUTPATIENT_CLINIC_OR_DEPARTMENT_OTHER): Payer: Self-pay

## 2022-03-25 ENCOUNTER — Other Ambulatory Visit: Payer: Self-pay | Admitting: Hematology & Oncology

## 2022-03-25 DIAGNOSIS — D3910 Neoplasm of uncertain behavior of unspecified ovary: Secondary | ICD-10-CM

## 2022-03-25 DIAGNOSIS — F419 Anxiety disorder, unspecified: Secondary | ICD-10-CM

## 2022-04-02 ENCOUNTER — Ambulatory Visit (INDEPENDENT_AMBULATORY_CARE_PROVIDER_SITE_OTHER): Payer: 59

## 2022-04-02 DIAGNOSIS — I480 Paroxysmal atrial fibrillation: Secondary | ICD-10-CM

## 2022-04-02 LAB — CUP PACEART REMOTE DEVICE CHECK
Battery Remaining Longevity: 85 mo
Battery Remaining Percentage: 81 %
Battery Voltage: 2.99 V
Brady Statistic AP VP Percent: 1 %
Brady Statistic AP VS Percent: 1.9 %
Brady Statistic AS VP Percent: 1 %
Brady Statistic AS VS Percent: 97 %
Brady Statistic RA Percent Paced: 1 %
Brady Statistic RV Percent Paced: 1 %
Date Time Interrogation Session: 20231010021002
HighPow Impedance: 68 Ohm
Implantable Lead Implant Date: 20110503
Implantable Lead Implant Date: 20130815
Implantable Lead Location: 753859
Implantable Lead Location: 753860
Implantable Pulse Generator Implant Date: 20211012
Lead Channel Impedance Value: 260 Ohm
Lead Channel Impedance Value: 440 Ohm
Lead Channel Pacing Threshold Amplitude: 0.5 V
Lead Channel Pacing Threshold Amplitude: 2.375 V
Lead Channel Pacing Threshold Pulse Width: 0.5 ms
Lead Channel Pacing Threshold Pulse Width: 1 ms
Lead Channel Sensing Intrinsic Amplitude: 12 mV
Lead Channel Sensing Intrinsic Amplitude: 2.2 mV
Lead Channel Setting Pacing Amplitude: 2 V
Lead Channel Setting Pacing Amplitude: 2.625
Lead Channel Setting Pacing Pulse Width: 1 ms
Lead Channel Setting Sensing Sensitivity: 0.5 mV
Pulse Gen Serial Number: 111028151

## 2022-04-03 ENCOUNTER — Telehealth (HOSPITAL_BASED_OUTPATIENT_CLINIC_OR_DEPARTMENT_OTHER): Payer: Self-pay

## 2022-04-04 ENCOUNTER — Other Ambulatory Visit: Payer: Self-pay | Admitting: Hematology & Oncology

## 2022-04-04 ENCOUNTER — Ambulatory Visit: Payer: 59

## 2022-04-04 DIAGNOSIS — D3910 Neoplasm of uncertain behavior of unspecified ovary: Secondary | ICD-10-CM

## 2022-04-09 ENCOUNTER — Ambulatory Visit: Payer: 59 | Attending: Interventional Cardiology

## 2022-04-09 DIAGNOSIS — Z5181 Encounter for therapeutic drug level monitoring: Secondary | ICD-10-CM | POA: Diagnosis not present

## 2022-04-09 DIAGNOSIS — Z953 Presence of xenogenic heart valve: Secondary | ICD-10-CM | POA: Diagnosis not present

## 2022-04-09 DIAGNOSIS — Z952 Presence of prosthetic heart valve: Secondary | ICD-10-CM | POA: Diagnosis not present

## 2022-04-09 DIAGNOSIS — I480 Paroxysmal atrial fibrillation: Secondary | ICD-10-CM | POA: Diagnosis not present

## 2022-04-09 LAB — POCT INR: INR: 3.1 — AB (ref 2.0–3.0)

## 2022-04-09 NOTE — Patient Instructions (Signed)
DOES NOT NEED A PRINTOUT. Continue taking warfarin 1 tablet daily except for 1/2 tablet on Monday, Wednesday and Friday. Eat some leafy veggies today and remain consistent. Recheck INR in 6 weeks. [facility] [phone]

## 2022-04-11 ENCOUNTER — Other Ambulatory Visit: Payer: Self-pay | Admitting: Hematology & Oncology

## 2022-04-16 ENCOUNTER — Other Ambulatory Visit: Payer: Self-pay | Admitting: Hematology & Oncology

## 2022-04-16 DIAGNOSIS — F419 Anxiety disorder, unspecified: Secondary | ICD-10-CM

## 2022-04-16 NOTE — Progress Notes (Signed)
Remote ICD transmission.   

## 2022-04-17 ENCOUNTER — Other Ambulatory Visit: Payer: Self-pay | Admitting: Pharmacist

## 2022-04-17 ENCOUNTER — Encounter: Payer: Self-pay | Admitting: Hematology & Oncology

## 2022-04-19 ENCOUNTER — Other Ambulatory Visit: Payer: Self-pay | Admitting: Hematology & Oncology

## 2022-04-19 DIAGNOSIS — D3910 Neoplasm of uncertain behavior of unspecified ovary: Secondary | ICD-10-CM

## 2022-04-20 ENCOUNTER — Encounter: Payer: Self-pay | Admitting: Hematology & Oncology

## 2022-04-22 ENCOUNTER — Inpatient Hospital Stay: Payer: 59

## 2022-04-23 ENCOUNTER — Other Ambulatory Visit: Payer: Self-pay | Admitting: *Deleted

## 2022-04-23 ENCOUNTER — Other Ambulatory Visit: Payer: 59

## 2022-04-23 DIAGNOSIS — D500 Iron deficiency anemia secondary to blood loss (chronic): Secondary | ICD-10-CM

## 2022-04-24 ENCOUNTER — Inpatient Hospital Stay: Payer: 59 | Attending: Hematology & Oncology

## 2022-04-24 ENCOUNTER — Ambulatory Visit (HOSPITAL_BASED_OUTPATIENT_CLINIC_OR_DEPARTMENT_OTHER)
Admission: RE | Admit: 2022-04-24 | Discharge: 2022-04-24 | Disposition: A | Discharge status: Home / Self Care | Payer: 59 | Source: Ambulatory Visit | Attending: Hematology & Oncology | Admitting: Hematology & Oncology

## 2022-04-24 ENCOUNTER — Inpatient Hospital Stay: Payer: 59

## 2022-04-24 DIAGNOSIS — D500 Iron deficiency anemia secondary to blood loss (chronic): Secondary | ICD-10-CM

## 2022-04-24 DIAGNOSIS — Z95828 Presence of other vascular implants and grafts: Secondary | ICD-10-CM

## 2022-04-24 DIAGNOSIS — K909 Intestinal malabsorption, unspecified: Secondary | ICD-10-CM | POA: Diagnosis present

## 2022-04-24 DIAGNOSIS — D508 Other iron deficiency anemias: Secondary | ICD-10-CM | POA: Diagnosis present

## 2022-04-24 DIAGNOSIS — D3910 Neoplasm of uncertain behavior of unspecified ovary: Secondary | ICD-10-CM | POA: Insufficient documentation

## 2022-04-24 DIAGNOSIS — Z452 Encounter for adjustment and management of vascular access device: Secondary | ICD-10-CM | POA: Insufficient documentation

## 2022-04-24 DIAGNOSIS — C569 Malignant neoplasm of unspecified ovary: Secondary | ICD-10-CM | POA: Insufficient documentation

## 2022-04-24 LAB — COMPREHENSIVE METABOLIC PANEL
ALT: 8 U/L (ref 0–44)
AST: 14 U/L — ABNORMAL LOW (ref 15–41)
Albumin: 3.8 g/dL (ref 3.5–5.0)
Alkaline Phosphatase: 50 U/L (ref 38–126)
Anion gap: 8 (ref 5–15)
BUN: 8 mg/dL (ref 8–23)
CO2: 29 mmol/L (ref 22–32)
Calcium: 8.8 mg/dL — ABNORMAL LOW (ref 8.9–10.3)
Chloride: 101 mmol/L (ref 98–111)
Creatinine, Ser: 1.02 mg/dL — ABNORMAL HIGH (ref 0.44–1.00)
GFR, Estimated: >60 mL/min (ref >60)
Glucose, Bld: 98 mg/dL (ref 70–99)
Potassium: 3.7 mmol/L (ref 3.5–5.1)
Sodium: 138 mmol/L (ref 135–145)
Total Bilirubin: 0.4 mg/dL (ref 0.3–1.2)
Total Protein: 6.2 g/dL — ABNORMAL LOW (ref 6.5–8.1)

## 2022-04-24 LAB — CBC WITH DIFFERENTIAL (CANCER CENTER ONLY)
Abs Immature Granulocytes: 0.04 10*3/uL (ref 0.00–0.07)
Basophils Absolute: 0 10*3/uL (ref 0.0–0.1)
Basophils Relative: 0 %
Eosinophils Absolute: 0.1 10*3/uL (ref 0.0–0.5)
Eosinophils Relative: 1 %
HCT: 36.5 % (ref 36.0–46.0)
Hemoglobin: 12 g/dL (ref 12.0–15.0)
Immature Granulocytes: 1 %
Lymphocytes Relative: 9 %
Lymphs Abs: 0.7 10*3/uL (ref 0.7–4.0)
MCH: 30.5 pg (ref 26.0–34.0)
MCHC: 32.9 g/dL (ref 30.0–36.0)
MCV: 92.9 fL (ref 80.0–100.0)
Monocytes Absolute: 0.6 10*3/uL (ref 0.1–1.0)
Monocytes Relative: 7 %
Neutro Abs: 6.4 10*3/uL (ref 1.7–7.7)
Neutrophils Relative %: 82 %
Platelet Count: 195 10*3/uL (ref 150–400)
RBC: 3.93 MIL/uL (ref 3.87–5.11)
RDW: 12.9 % (ref 11.5–15.5)
WBC Count: 7.8 10*3/uL (ref 4.0–10.5)
nRBC: 0 % (ref 0.0–0.2)

## 2022-04-24 MED ORDER — SODIUM CHLORIDE 0.9% FLUSH
10.0000 mL | Freq: Once | INTRAVENOUS | Status: AC
  Administered 2022-04-24: 10 mL via INTRAVENOUS

## 2022-04-24 MED ORDER — HEPARIN SOD (PORK) LOCK FLUSH 100 UNIT/ML IV SOLN
500.0000 [IU] | Freq: Once | INTRAVENOUS | Status: AC
  Administered 2022-04-24: 500 [IU] via INTRAVENOUS

## 2022-04-24 MED ORDER — IOHEXOL 300 MG/ML  SOLN
75.0000 mL | Freq: Once | INTRAMUSCULAR | Status: AC | PRN
  Administered 2022-04-24: 100 mL via INTRAVENOUS

## 2022-04-26 ENCOUNTER — Telehealth: Payer: Self-pay

## 2022-04-26 NOTE — Telephone Encounter (Signed)
-----   Message from Volanda Napoleon, MD sent at 04/26/2022 11:36 AM EDT ----- Call let her know that the CT scan that she is not as bad as I would have thought.  There may be some slight progression but nothing really all that significant.  As such, maybe we can just keep you on the same treatment that we have you on right now.  [name]

## 2022-05-03 ENCOUNTER — Other Ambulatory Visit: Payer: Self-pay | Admitting: Hematology & Oncology

## 2022-05-03 DIAGNOSIS — D3910 Neoplasm of uncertain behavior of unspecified ovary: Secondary | ICD-10-CM

## 2022-05-08 ENCOUNTER — Other Ambulatory Visit: Payer: Self-pay | Admitting: Hematology & Oncology

## 2022-05-08 DIAGNOSIS — F419 Anxiety disorder, unspecified: Secondary | ICD-10-CM

## 2022-05-15 ENCOUNTER — Other Ambulatory Visit: Payer: Self-pay | Admitting: Hematology & Oncology

## 2022-05-15 DIAGNOSIS — D3910 Neoplasm of uncertain behavior of unspecified ovary: Secondary | ICD-10-CM

## 2022-05-21 ENCOUNTER — Ambulatory Visit: Payer: 59 | Attending: Cardiology | Admitting: *Deleted

## 2022-05-21 DIAGNOSIS — I480 Paroxysmal atrial fibrillation: Secondary | ICD-10-CM

## 2022-05-21 DIAGNOSIS — Z953 Presence of xenogenic heart valve: Secondary | ICD-10-CM | POA: Diagnosis not present

## 2022-05-21 DIAGNOSIS — Z952 Presence of prosthetic heart valve: Secondary | ICD-10-CM

## 2022-05-21 LAB — POCT INR: INR: 2.3 (ref 2.0–3.0)

## 2022-05-21 NOTE — Patient Instructions (Signed)
Description   DOES NOT NEED A PRINTOUT. Continue taking warfarin 1 tablet daily except for 1/2 tablet on Monday, Wednesday and Friday. Remain consistent with leafy veggeis. Recheck INR in 6 weeks. [facility] [phone]

## 2022-05-29 ENCOUNTER — Other Ambulatory Visit: Payer: Self-pay | Admitting: Hematology & Oncology

## 2022-05-29 DIAGNOSIS — F419 Anxiety disorder, unspecified: Secondary | ICD-10-CM

## 2022-06-03 ENCOUNTER — Other Ambulatory Visit: Payer: Self-pay | Admitting: Hematology & Oncology

## 2022-06-03 DIAGNOSIS — D3910 Neoplasm of uncertain behavior of unspecified ovary: Secondary | ICD-10-CM

## 2022-06-07 ENCOUNTER — Inpatient Hospital Stay: Payer: 59 | Admitting: Hematology & Oncology

## 2022-06-07 ENCOUNTER — Inpatient Hospital Stay: Payer: 59

## 2022-06-10 ENCOUNTER — Other Ambulatory Visit: Payer: Self-pay | Admitting: Hematology & Oncology

## 2022-06-10 DIAGNOSIS — D3910 Neoplasm of uncertain behavior of unspecified ovary: Secondary | ICD-10-CM

## 2022-06-13 ENCOUNTER — Inpatient Hospital Stay: Payer: 59

## 2022-06-13 ENCOUNTER — Telehealth: Payer: Self-pay | Admitting: *Deleted

## 2022-06-13 ENCOUNTER — Inpatient Hospital Stay: Payer: 59 | Admitting: Hematology & Oncology

## 2022-06-13 NOTE — Telephone Encounter (Signed)
Call received from patient stating that she is not feeling well and would like to move today's appts out by two weeks.  Message sent to scheduling.

## 2022-06-20 ENCOUNTER — Other Ambulatory Visit: Payer: Self-pay | Admitting: Hematology & Oncology

## 2022-06-20 DIAGNOSIS — F419 Anxiety disorder, unspecified: Secondary | ICD-10-CM

## 2022-06-28 ENCOUNTER — Encounter: Payer: Self-pay | Admitting: Hematology & Oncology

## 2022-06-28 ENCOUNTER — Inpatient Hospital Stay: Payer: 59 | Attending: Hematology & Oncology

## 2022-06-28 ENCOUNTER — Other Ambulatory Visit: Payer: Self-pay

## 2022-06-28 ENCOUNTER — Inpatient Hospital Stay: Payer: 59

## 2022-06-28 ENCOUNTER — Inpatient Hospital Stay (HOSPITAL_BASED_OUTPATIENT_CLINIC_OR_DEPARTMENT_OTHER): Payer: 59 | Admitting: Hematology & Oncology

## 2022-06-28 VITALS — BP 113/63 | HR 79 | Temp 97.5°F | Resp 18 | Ht 66.0 in | Wt 174.0 lb

## 2022-06-28 DIAGNOSIS — Z952 Presence of prosthetic heart valve: Secondary | ICD-10-CM | POA: Diagnosis not present

## 2022-06-28 DIAGNOSIS — Z7901 Long term (current) use of anticoagulants: Secondary | ICD-10-CM | POA: Insufficient documentation

## 2022-06-28 DIAGNOSIS — D3910 Neoplasm of uncertain behavior of unspecified ovary: Secondary | ICD-10-CM | POA: Insufficient documentation

## 2022-06-28 DIAGNOSIS — C569 Malignant neoplasm of unspecified ovary: Secondary | ICD-10-CM | POA: Insufficient documentation

## 2022-06-28 DIAGNOSIS — K909 Intestinal malabsorption, unspecified: Secondary | ICD-10-CM | POA: Diagnosis not present

## 2022-06-28 DIAGNOSIS — Z5112 Encounter for antineoplastic immunotherapy: Secondary | ICD-10-CM | POA: Insufficient documentation

## 2022-06-28 DIAGNOSIS — I480 Paroxysmal atrial fibrillation: Secondary | ICD-10-CM | POA: Diagnosis not present

## 2022-06-28 DIAGNOSIS — Z79899 Other long term (current) drug therapy: Secondary | ICD-10-CM | POA: Diagnosis not present

## 2022-06-28 DIAGNOSIS — Z953 Presence of xenogenic heart valve: Secondary | ICD-10-CM | POA: Diagnosis not present

## 2022-06-28 DIAGNOSIS — D500 Iron deficiency anemia secondary to blood loss (chronic): Secondary | ICD-10-CM

## 2022-06-28 DIAGNOSIS — D508 Other iron deficiency anemias: Secondary | ICD-10-CM | POA: Diagnosis present

## 2022-06-28 LAB — CBC WITH DIFFERENTIAL (CANCER CENTER ONLY)
Abs Immature Granulocytes: 0.03 10*3/uL (ref 0.00–0.07)
Basophils Absolute: 0 10*3/uL (ref 0.0–0.1)
Basophils Relative: 1 %
Eosinophils Absolute: 0.1 10*3/uL (ref 0.0–0.5)
Eosinophils Relative: 2 %
HCT: 34.3 % — ABNORMAL LOW (ref 36.0–46.0)
Hemoglobin: 11.5 g/dL — ABNORMAL LOW (ref 12.0–15.0)
Immature Granulocytes: 0 %
Lymphocytes Relative: 11 %
Lymphs Abs: 0.8 10*3/uL (ref 0.7–4.0)
MCH: 31.6 pg (ref 26.0–34.0)
MCHC: 33.5 g/dL (ref 30.0–36.0)
MCV: 94.2 fL (ref 80.0–100.0)
Monocytes Absolute: 0.7 10*3/uL (ref 0.1–1.0)
Monocytes Relative: 10 %
Neutro Abs: 5.7 10*3/uL (ref 1.7–7.7)
Neutrophils Relative %: 76 %
Platelet Count: 163 10*3/uL (ref 150–400)
RBC: 3.64 MIL/uL — ABNORMAL LOW (ref 3.87–5.11)
RDW: 12.6 % (ref 11.5–15.5)
WBC Count: 7.4 10*3/uL (ref 4.0–10.5)
nRBC: 0 % (ref 0.0–0.2)

## 2022-06-28 LAB — CMP (CANCER CENTER ONLY)
ALT: 8 U/L (ref 0–44)
AST: 14 U/L — ABNORMAL LOW (ref 15–41)
Albumin: 4 g/dL (ref 3.5–5.0)
Alkaline Phosphatase: 36 U/L — ABNORMAL LOW (ref 38–126)
Anion gap: 9 (ref 5–15)
BUN: 11 mg/dL (ref 8–23)
CO2: 27 mmol/L (ref 22–32)
Calcium: 8.5 mg/dL — ABNORMAL LOW (ref 8.9–10.3)
Chloride: 102 mmol/L (ref 98–111)
Creatinine: 1.06 mg/dL — ABNORMAL HIGH (ref 0.44–1.00)
GFR, Estimated: 59 mL/min — ABNORMAL LOW (ref >60)
Glucose, Bld: 79 mg/dL (ref 70–99)
Potassium: 3.4 mmol/L — ABNORMAL LOW (ref 3.5–5.1)
Sodium: 138 mmol/L (ref 135–145)
Total Bilirubin: 0.3 mg/dL (ref 0.3–1.2)
Total Protein: 6.2 g/dL — ABNORMAL LOW (ref 6.5–8.1)

## 2022-06-28 LAB — RETICULOCYTES
Immature Retic Fract: 8.7 % (ref 2.3–15.9)
RBC.: 3.62 MIL/uL — ABNORMAL LOW (ref 3.87–5.11)
Retic Count, Absolute: 60.8 10*3/uL (ref 19.0–186.0)
Retic Ct Pct: 1.7 % (ref 0.4–3.1)

## 2022-06-28 LAB — FERRITIN: Ferritin: 17 ng/mL (ref 11–307)

## 2022-06-28 LAB — LACTATE DEHYDROGENASE: LDH: 155 U/L (ref 98–192)

## 2022-06-28 NOTE — Patient Instructions (Signed)
Implanted Port Removal, Care After The following information offers guidance on how to care for yourself after your procedure. Your health care provider may also give you more specific instructions. If you have problems or questions, contact your health care provider. What can I expect after the procedure? After the procedure, it is common to have: Soreness or pain near your incision. Some swelling or bruising near your incision. Follow these instructions at home: Medicines Take over-the-counter and prescription medicines only as told by your health care provider. If you were prescribed an antibiotic medicine, take it as told by your health care provider. Do not stop taking the antibiotic even if you start to feel better. Bathing Do not take baths, swim, or use a hot tub until your health care provider approves. Ask your health care provider if you can take showers. You may only be allowed to take sponge baths. Incision care  Follow instructions from your health care provider about how to take care of your incision. Make sure you: Wash your hands with soap and water for at least 20 seconds before and after you change your bandage (dressing). If soap and water are not available, use hand sanitizer. Change your dressing as told by your health care provider. Keep your dressing dry. Leave stitches (sutures), skin glue, or adhesive strips in place. These skin closures may need to stay in place for 2 weeks or longer. If adhesive strip edges start to loosen and curl up, you may trim the loose edges. Do not remove adhesive strips completely unless your health care provider tells you to do that. Check your incision area every day for signs of infection. Check for: More redness, swelling, or pain. More fluid or blood. Warmth. Pus or a bad smell. Activity Return to your normal activities as told by your health care provider. Ask your health care provider what activities are safe for you. You may have  to avoid lifting. Ask your health care provider how much you can safely lift. Do not do activities that involve lifting your arms over your head. Driving  If you were given a sedative during the procedure, it can affect you for several hours. Do not drive or operate machinery until your health care provider says that it is safe. If you did not receive a sedative, ask your health care provider when it is safe to drive. General instructions Do not use any products that contain nicotine or tobacco. These products include cigarettes, chewing tobacco, and vaping devices, such as e-cigarettes. These can delay healing after surgery. If you need help quitting, ask your health care provider. Keep all follow-up visits. This is important. Contact a health care provider if: You have a fever or chills. You have more redness, swelling, or pain around your incision. You have more fluid or blood coming from your incision. Your incision feels warm to the touch. You have pus or a bad smell coming from your incision. You have pain that is not relieved by your pain medicine. Get help right away if: You have chest pain. You have difficulty breathing. These symptoms may be an emergency. Get help right away. Call 911. Do not wait to see if the symptoms will go away. Do not drive yourself to the hospital. Summary After the procedure, it is common to have pain, soreness, swelling, or bruising near your incision. If you were prescribed an antibiotic medicine, take it as told by your health care provider. Do not stop taking the antibiotic even if you   start to feel better. If you were given a sedative during the procedure, it can affect you for several hours. Do not drive or operate machinery until your health care provider says that it is safe. Return to your normal activities as told by your health care provider. Ask your health care provider what activities are safe for you. This information is not intended to  replace advice given to you by your health care provider. Make sure you discuss any questions you have with your health care provider. Document Revised: 12/12/2020 Document Reviewed: 12/12/2020 Elsevier Patient Education  2023 Elsevier Inc.  

## 2022-06-28 NOTE — Progress Notes (Signed)
Hematology and Oncology Follow Up Visit  [name] [id number] 1960-11-23 62 y.o. 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  As always, [name] is quite busy at work.  [name] works for a [employer].  I am sure that we probably are doing our part to keeping her busy.  We last saw her back in September.  At that time, we noted that her Inhibin B level is on the high side.  I think was 108.  We did go ahead and do a CT scan on her.  This was done in November.  The CT scan did show that there was some minimal progression of disease down in the pelvis.  We are following her right now.  We had her through multiple lines of therapy.  I know that [name] has not had immunotherapy yet.  I am not sure there is much information about immunotherapy regarding granulosa cell tumors.  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  As such, we might be able to use erdafitinib.  We will see what her Inhibin B level is.  [name] is on Coumadin for her cardiac valve.  This is being monitored by cardiology.  When we last saw her, her ferritin was 25 with an iron saturation of 23%.  [name] has had no cough or shortness of breath.  [name] has had no leg swelling.  [name] is expecting another grandchild.  [name] is very excited about this.  There is no change in bowel or bladder habits.  Overall, I would say that her performance status right now is probably ECOG 1.     Medications:  Allergies as of 06/28/2022       Reactions   Augmentin [amoxicillin-pot Clavulanate] Other (See Comments)   Muscle Aches   Prochlorperazine  Edisylate Other (See Comments)   Nervous/ flutter/ shakes   Adhesive [tape] Hives   Redness/hives from adhesive tape( tolerates latex gloves); tolerates paper tape        Medication List        Accurate as of June 28, 2022  4:11 PM. If you have any questions, ask your nurse or doctor.          albuterol 108 (90 Base) MCG/ACT inhaler Commonly known as: VENTOLIN HFA TAKE 2 PUFFS BY MOUTH EVERY 6 HOURS AS NEEDED FOR WHEEZE OR SHORTNESS OF BREATH   amoxicillin 500 MG tablet Commonly known as: AMOXIL Take 2,000 mg by mouth See admin instructions. Take 2,000 mg one hour prior to dental visits.   aspirin EC 81 MG tablet Take 81 mg by mouth daily.   b complex vitamins capsule Take 1 capsule by mouth 2 (two) times daily.   carvedilol 3.125 MG tablet Commonly known as: COREG Take 1 tablet (3.125 mg total) by mouth 2 (two) times daily with a meal.   cholecalciferol 25 MCG (1000 UNIT) tablet Commonly known as: VITAMIN D3 Take 4,000 Units by mouth daily.   furosemide 20 MG tablet Commonly known as: LASIX Take 1 tablet (20 mg total) by mouth daily.   lidocaine-prilocaine cream Commonly known as: EMLA Apply 1 application  topically as needed. Apply to port per instructions   loperamide 2 MG tablet Commonly known as: IMODIUM A-D Take 4 mg by mouth 3 (three) times daily as needed for diarrhea or loose stools.   LORazepam 0.5 MG tablet Commonly known as: ATIVAN TAKE ONE TABLET BY MOUTH EVERY 8 HOURS   methocarbamol 500 MG tablet Commonly known as: ROBAXIN TAKE ONE TABLET BY MOUTH EVERY 6 HOURS AS NEEDED FOR MUSCLE SPASMS   Potassium Chloride ER 20 MEQ Tbcr Take 20 mEq by mouth daily.   spironolactone 25 MG tablet Commonly known as: ALDACTONE Take 1 tablet (25 mg total) by mouth daily.   traMADol 50 MG tablet Commonly known as: ULTRAM TAKE ONE TABLET BY MOUTH EVERY 6 HOURS AS NEEDED   Vitamin D (Ergocalciferol) 1.25 MG (50000 UNIT) Caps capsule Commonly known as:  DRISDOL TAKE ONE CAPSULE BY MOUTH every 7 DAYS   warfarin 7.5 MG tablet Commonly known as: COUMADIN Take as directed by the anticoagulation clinic. If you are unsure how to take this medication, talk to your nurse or doctor. Original instructions: Take 1 tablet by mouth daily except 1/2 tablet on Monday and Fridays or as directed by Anticoagulation Clinic.   zolpidem 10 MG tablet Commonly known as: AMBIEN TAKE ONE TABLET BY MOUTH EVERY DAY        Allergies:  Allergies  Allergen Reactions   Augmentin [Amoxicillin-Pot Clavulanate] Other (See Comments)    Muscle Aches   Prochlorperazine Edisylate Other (See Comments)    Nervous/ flutter/ shakes   Adhesive [Tape] Hives    Redness/hives from adhesive tape( tolerates latex gloves); tolerates paper tape    Past Medical History, Surgical history, Social history, and Family History were reviewed and updated.  Review of Systems:  Review of Systems  Constitutional: Negative.   HENT: Negative.    Eyes: Negative.   Respiratory: Negative.    Cardiovascular: Negative.   Gastrointestinal: Negative.   Genitourinary: Negative.   Musculoskeletal: Negative.   Skin: Negative.   Neurological: Negative.   Endo/Heme/Allergies: Negative.   Psychiatric/Behavioral: Negative.        Exam:  height is '5\' 6"'$  (1.676 m) and weight is 174 lb (78.9 kg). Her oral temperature is 97.5 F (36.4 C) (abnormal). Her blood pressure is 113/63 and her pulse is 79. Her respiration is 18 and oxygen saturation is 100%.   Wt Readings from Last 3 Encounters:  06/28/22 174 lb (78.9 kg)  03/08/22 172 lb (78 kg)  11/30/21 176 lb (79.8 kg)    Physical Exam Vitals reviewed.  HENT:     Head: Normocephalic and atraumatic.  Eyes:     Pupils: Pupils are equal, round, and reactive to light.  Cardiovascular:     Rate and Rhythm: Normal rate and regular rhythm.     Heart sounds: Normal heart sounds.  Pulmonary:     Effort: Pulmonary effort is normal.      Breath sounds: Normal breath sounds.  Abdominal:     General: Bowel sounds are normal.     Palpations: Abdomen is soft.  Musculoskeletal:        General: No tenderness or deformity. Normal range of motion.     Cervical back: Normal range of motion.  Lymphadenopathy:     Cervical: No cervical adenopathy.  Skin:    General: Skin is warm and dry.     Findings: No erythema or rash.  Neurological:     Mental Status: [name] is alert and oriented to person, place, and  time.  Psychiatric:        Behavior: Behavior normal.        Thought Content: Thought content normal.        Judgment: Judgment normal.      Lab Results  Component Value Date   WBC 7.4 06/28/2022   HGB 11.5 (L) 06/28/2022   HCT 34.3 (L) 06/28/2022   MCV 94.2 06/28/2022   PLT 163 06/28/2022   Lab Results  Component Value Date   FERRITIN 25 03/08/2022   IRON 84 03/08/2022   TIBC 360 03/08/2022   UIBC 276 03/08/2022   IRONPCTSAT 23 03/08/2022   Lab Results  Component Value Date   RETICCTPCT 1.7 06/28/2022   RBC 3.62 (L) 06/28/2022   No results found for: "KPAFRELGTCHN", "LAMBDASER", "KAPLAMBRATIO" No results found for: "IGGSERUM", "IGA", "IGMSERUM" No results found for: "TOTALPROTELP", "ALBUMINELP", "A1GS", "A2GS", "BETS", "BETA2SER", "GAMS", "MSPIKE", "SPEI"   Chemistry      Component Value Date/Time   NA 138 06/28/2022 1520   NA 139 08/14/2020 1428   NA 140 03/15/2016 0953   K 3.4 (L) 06/28/2022 1520   K 3.9 03/15/2016 0953   CL 102 06/28/2022 1520   CL 102 12/13/2015 0803   CO2 27 06/28/2022 1520   CO2 19 (L) 03/15/2016 0953   BUN 11 06/28/2022 1520   BUN 16 08/14/2020 1428   BUN 15.9 03/15/2016 0953   CREATININE 1.06 (H) 06/28/2022 1520   CREATININE 1.4 (H) 03/15/2016 0953      Component Value Date/Time   CALCIUM 8.5 (L) 06/28/2022 1520   CALCIUM 9.3 03/15/2016 0953   ALKPHOS 36 (L) 06/28/2022 1520   ALKPHOS 55 03/15/2016 0953   AST 14 (L) 06/28/2022 1520   AST 17 03/15/2016 0953   ALT 8  06/28/2022 1520   ALT 23 03/15/2016 0953   BILITOT 0.3 06/28/2022 1520   BILITOT 0.38 03/15/2016 0953       Impression and Plan: [name] is a very pleasant 62 yo caucasian female with recurrent granulosa cell tumor.  We will have to see with the Inhibin B level is.  This will be critical.  If this is significantly elevated, we might want to think about starting her on treatment.  Again, quality of life is what [name] wishes most.    We will see what her iron levels are.  Oftentimes we had to give her some IV iron.  Again, the Inhibin B level will give [place] an idea as to what we need to do.  We will, otherwise, plan to get her back in another 3 months.  [name] does have a Port-A-Cath in.  We will have to make sure we flush this.    Volanda Napoleon, MD 1/5/20244:11 PM

## 2022-07-01 LAB — IRON AND IRON BINDING CAPACITY (CC-WL,HP ONLY)
Iron: 42 ug/dL (ref 28–170)
Saturation Ratios: 12 % (ref 10.4–31.8)
TIBC: 346 ug/dL (ref 250–450)
UIBC: 304 ug/dL (ref 148–442)

## 2022-07-01 LAB — INHIBIN B: Inhibin B: 145.3 pg/mL — ABNORMAL HIGH (ref 0.0–16.9)

## 2022-07-02 ENCOUNTER — Ambulatory Visit (INDEPENDENT_AMBULATORY_CARE_PROVIDER_SITE_OTHER): Payer: 59

## 2022-07-02 DIAGNOSIS — I255 Ischemic cardiomyopathy: Secondary | ICD-10-CM

## 2022-07-02 LAB — CUP PACEART REMOTE DEVICE CHECK
Battery Remaining Longevity: 82 mo
Battery Remaining Percentage: 78 %
Battery Voltage: 2.99 V
Brady Statistic AP VP Percent: 1 %
Brady Statistic AP VS Percent: 1.8 %
Brady Statistic AS VP Percent: 1 %
Brady Statistic AS VS Percent: 97 %
Brady Statistic RA Percent Paced: 1 %
Brady Statistic RV Percent Paced: 1 %
Date Time Interrogation Session: 20240109044853
HighPow Impedance: 68 Ohm
Implantable Lead Connection Status: 753985
Implantable Lead Connection Status: 753985
Implantable Lead Implant Date: 20110503
Implantable Lead Implant Date: 20130815
Implantable Lead Location: 753859
Implantable Lead Location: 753860
Implantable Pulse Generator Implant Date: 20211012
Lead Channel Impedance Value: 260 Ohm
Lead Channel Impedance Value: 390 Ohm
Lead Channel Pacing Threshold Amplitude: 0.5 V
Lead Channel Pacing Threshold Amplitude: 2.125 V
Lead Channel Pacing Threshold Pulse Width: 0.5 ms
Lead Channel Pacing Threshold Pulse Width: 1 ms
Lead Channel Sensing Intrinsic Amplitude: 12 mV
Lead Channel Sensing Intrinsic Amplitude: 2.1 mV
Lead Channel Setting Pacing Amplitude: 2 V
Lead Channel Setting Pacing Amplitude: 2.375
Lead Channel Setting Pacing Pulse Width: 1 ms
Lead Channel Setting Sensing Sensitivity: 0.5 mV
Pulse Gen Serial Number: 111028151
Zone Setting Status: 755011

## 2022-07-03 ENCOUNTER — Other Ambulatory Visit: Payer: Self-pay | Admitting: Hematology & Oncology

## 2022-07-03 DIAGNOSIS — D3910 Neoplasm of uncertain behavior of unspecified ovary: Secondary | ICD-10-CM

## 2022-07-04 ENCOUNTER — Other Ambulatory Visit: Payer: Self-pay | Admitting: Hematology & Oncology

## 2022-07-04 DIAGNOSIS — D3910 Neoplasm of uncertain behavior of unspecified ovary: Secondary | ICD-10-CM

## 2022-07-05 ENCOUNTER — Ambulatory Visit: Payer: 59 | Attending: Cardiology

## 2022-07-05 DIAGNOSIS — I480 Paroxysmal atrial fibrillation: Secondary | ICD-10-CM | POA: Diagnosis not present

## 2022-07-05 DIAGNOSIS — Z953 Presence of xenogenic heart valve: Secondary | ICD-10-CM

## 2022-07-05 DIAGNOSIS — Z952 Presence of prosthetic heart valve: Secondary | ICD-10-CM

## 2022-07-05 LAB — POCT INR: INR: 1.8 — AB (ref 2.0–3.0)

## 2022-07-05 NOTE — Patient Instructions (Signed)
Description   DOES NOT NEED A PRINTOUT. Take 1 tablet today and then continue taking warfarin 1 tablet daily except for 1/2 tablet on Monday, Wednesday and Friday. Remain consistent with leafy veggeis.  Recheck INR in 6 weeks.  [facility] Clinic [phone]

## 2022-07-08 ENCOUNTER — Encounter: Payer: Self-pay | Admitting: Hematology & Oncology

## 2022-07-09 ENCOUNTER — Other Ambulatory Visit: Payer: Self-pay | Admitting: Hematology & Oncology

## 2022-07-09 DIAGNOSIS — F419 Anxiety disorder, unspecified: Secondary | ICD-10-CM

## 2022-07-10 ENCOUNTER — Inpatient Hospital Stay (HOSPITAL_BASED_OUTPATIENT_CLINIC_OR_DEPARTMENT_OTHER): Payer: 59 | Admitting: Hematology & Oncology

## 2022-07-10 ENCOUNTER — Inpatient Hospital Stay: Payer: 59

## 2022-07-10 ENCOUNTER — Encounter: Payer: Self-pay | Admitting: Hematology & Oncology

## 2022-07-10 VITALS — BP 116/69 | HR 75 | Temp 98.6°F | Resp 17

## 2022-07-10 DIAGNOSIS — D3910 Neoplasm of uncertain behavior of unspecified ovary: Secondary | ICD-10-CM | POA: Diagnosis not present

## 2022-07-10 DIAGNOSIS — D500 Iron deficiency anemia secondary to blood loss (chronic): Secondary | ICD-10-CM

## 2022-07-10 DIAGNOSIS — Z5112 Encounter for antineoplastic immunotherapy: Secondary | ICD-10-CM | POA: Diagnosis not present

## 2022-07-10 DIAGNOSIS — T451X5A Adverse effect of antineoplastic and immunosuppressive drugs, initial encounter: Secondary | ICD-10-CM

## 2022-07-10 MED ORDER — SODIUM CHLORIDE 0.9 % IV SOLN
125.0000 mg | Freq: Once | INTRAVENOUS | Status: AC
  Administered 2022-07-10: 125 mg via INTRAVENOUS
  Filled 2022-07-10: qty 125

## 2022-07-10 MED ORDER — HEPARIN SOD (PORK) LOCK FLUSH 100 UNIT/ML IV SOLN
500.0000 [IU] | Freq: Once | INTRAVENOUS | Status: AC
  Administered 2022-07-10: 500 [IU] via INTRAVENOUS

## 2022-07-10 MED ORDER — SODIUM CHLORIDE 0.9% FLUSH
10.0000 mL | Freq: Once | INTRAVENOUS | Status: AC
  Administered 2022-07-10: 10 mL via INTRAVENOUS

## 2022-07-10 MED ORDER — SODIUM CHLORIDE 0.9 % IV SOLN: INTRAVENOUS | Status: DC

## 2022-07-10 NOTE — Progress Notes (Signed)
START OFF PATHWAY REGIMEN - Other   [id number]:Pembrolizumab 200 mg IV D1 q21 Days:   A cycle is every 21 days:     Pembrolizumab   **Always confirm dose/schedule in your pharmacy ordering system**  Patient Characteristics: Intent of Therapy: Non-Curative / Palliative Intent, Discussed with Patient

## 2022-07-10 NOTE — Progress Notes (Signed)
Hematology and Oncology Follow Up Visit  [name] [id number] 08-15-1960 62 y.o. 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  Unfortunately, her Inhibin B has gone up quite significantly.  We saw her a week ago, the Inhibin B level was 160.  Think this is clear indication that her granulosa cell tumor is progressing.  She had a CT scan that was done just a couple months ago.  We really need to think about doing treatment on her.  She has been through several lines of therapy.  I think that what would be reasonable would be immunotherapy.  There have been some studies that have utilized pembrolizumab with granulosa cell tumor.  There is a response rate.  Since we are looking at quality of life and not try to cure this, I want to make sure that our treatment does not affect her quality of life.  I want to make sure that she is still able to work.  This is quite important for her.  She is not having any abdominal pain.  She was coming in for IV iron today.  When we saw her back a couple weeks ago, her ferritin was 17 with an iron saturation of 12%.  She was responds well to the IV iron.  She is having no problems with cough.  There is no rashes.  There is been no leg swelling.  She is on Coumadin because of a mechanical heart valve.  I do not see a problem with her being on immunotherapy.  I think that this is very reasonable to try.  Currently, I would say that her performance status is probably ECOG 0.  She is expecting another grandchild.  She is very excited about  this.     Medications:  Allergies as of 07/10/2022       Reactions   Augmentin [amoxicillin-pot Clavulanate] Other (See Comments)   Muscle Aches   Prochlorperazine Edisylate Other (See Comments)   Nervous/ flutter/ shakes   Adhesive [tape] Hives   Redness/hives from adhesive tape( tolerates latex gloves); tolerates paper tape        Medication List        Accurate as of July 10, 2022 11:53 AM. If you have any questions, ask your nurse or doctor.          albuterol 108 (90 Base) MCG/ACT inhaler Commonly known as: VENTOLIN HFA TAKE 2 PUFFS BY MOUTH EVERY 6 HOURS AS NEEDED FOR WHEEZE OR SHORTNESS OF BREATH   amoxicillin 500 MG tablet Commonly known as: AMOXIL Take 2,000 mg by mouth See admin instructions. Take 2,000 mg one hour prior to dental visits.   aspirin EC 81 MG tablet Take 81 mg by mouth daily.   b complex vitamins capsule Take 1 capsule by mouth 2 (two) times daily.   carvedilol 3.125 MG tablet Commonly known as: COREG Take 1 tablet (3.125 mg total) by mouth 2 (two) times daily with a meal.   cholecalciferol 25 MCG (1000 UNIT) tablet Commonly known as: VITAMIN D3 Take 4,000 Units by mouth daily.  furosemide 20 MG tablet Commonly known as: LASIX Take 1 tablet (20 mg total) by mouth daily.   lidocaine-prilocaine cream Commonly known as: EMLA Apply 1 application topically as needed. Apply to port per instructions   loperamide 2 MG tablet Commonly known as: IMODIUM A-D Take 4 mg by mouth 3 (three) times daily as needed for diarrhea or loose stools.   LORazepam 0.5 MG tablet Commonly known as: ATIVAN TAKE ONE TABLET BY MOUTH EVERY 8 HOURS   methocarbamol 500 MG tablet Commonly known as: ROBAXIN TAKE ONE TABLET BY MOUTH EVERY 6 HOURS AS NEEDED FOR MUSCLE SPASMS   Potassium Chloride ER 20 MEQ Tbcr Take 20 mEq by mouth daily.   spironolactone 25 MG tablet Commonly known as: ALDACTONE Take 1 tablet (25 mg total) by mouth daily.   traMADol  50 MG tablet Commonly known as: ULTRAM TAKE ONE TABLET BY MOUTH EVERY 6 HOURS AS NEEDED   Vitamin D (Ergocalciferol) 1.25 MG (50000 UNIT) Caps capsule Commonly known as: DRISDOL TAKE ONE CAPSULE BY MOUTH every 7 DAYS   warfarin 7.5 MG tablet Commonly known as: COUMADIN Take as directed by the anticoagulation clinic. If you are unsure how to take this medication, talk to your nurse or doctor. Original instructions: Take 1 tablet by mouth daily except 1/2 tablet on Monday and Fridays or as directed by Anticoagulation Clinic.   zolpidem 10 MG tablet Commonly known as: AMBIEN TAKE ONE TABLET BY MOUTH EVERY DAY        Allergies:  Allergies  Allergen Reactions   Augmentin [Amoxicillin-Pot Clavulanate] Other (See Comments)    Muscle Aches   Prochlorperazine Edisylate Other (See Comments)    Nervous/ flutter/ shakes   Adhesive [Tape] Hives    Redness/hives from adhesive tape( tolerates latex gloves); tolerates paper tape    Past Medical History, Surgical history, Social history, and Family History were reviewed and updated.  Review of Systems:  Review of Systems  Constitutional: Negative.   HENT: Negative.    Eyes: Negative.   Respiratory: Negative.    Cardiovascular: Negative.   Gastrointestinal: Negative.   Genitourinary: Negative.   Musculoskeletal: Negative.   Skin: Negative.   Neurological: Negative.   Endo/Heme/Allergies: Negative.   Psychiatric/Behavioral: Negative.        Exam:  vitals were not taken for this visit.   Wt Readings from Last 3 Encounters:  06/28/22 174 lb (78.9 kg)  03/08/22 172 lb (78 kg)  11/30/21 176 lb (79.8 kg)    Physical Exam Vitals reviewed.  HENT:     Head: Normocephalic and atraumatic.  Eyes:     Pupils: Pupils are equal, round, and reactive to light.  Cardiovascular:     Rate and Rhythm: Normal rate and regular rhythm.     Heart sounds: Normal heart sounds.  Pulmonary:     Effort: Pulmonary effort is normal.     Breath  sounds: Normal breath sounds.  Abdominal:     General: Bowel sounds are normal.     Palpations: Abdomen is soft.  Musculoskeletal:        General: No tenderness or deformity. Normal range of motion.     Cervical back: Normal range of motion.  Lymphadenopathy:     Cervical: No cervical adenopathy.  Skin:    General: Skin is warm and dry.     Findings: No erythema or rash.  Neurological:     Mental Status: She is alert and oriented to person, place, and time.  Psychiatric:  Behavior: Behavior normal.        Thought Content: Thought content normal.        Judgment: Judgment normal.     Lab Results  Component Value Date   WBC 7.4 06/28/2022   HGB 11.5 (L) 06/28/2022   HCT 34.3 (L) 06/28/2022   MCV 94.2 06/28/2022   PLT 163 06/28/2022   Lab Results  Component Value Date   FERRITIN 17 06/28/2022   IRON 42 06/28/2022   TIBC 346 06/28/2022   UIBC 304 06/28/2022   IRONPCTSAT 12 06/28/2022   Lab Results  Component Value Date   RETICCTPCT 1.7 06/28/2022   RBC 3.62 (L) 06/28/2022   No results found for: "KPAFRELGTCHN", "LAMBDASER", "KAPLAMBRATIO" No results found for: "IGGSERUM", "IGA", "IGMSERUM" No results found for: "TOTALPROTELP", "ALBUMINELP", "A1GS", "A2GS", "BETS", "BETA2SER", "GAMS", "MSPIKE", "SPEI"   Chemistry      Component Value Date/Time   NA 138 06/28/2022 1520   NA 139 08/14/2020 1428   NA 140 03/15/2016 0953   K 3.4 (L) 06/28/2022 1520   K 3.9 03/15/2016 0953   CL 102 06/28/2022 1520   CL 102 12/13/2015 0803   CO2 27 06/28/2022 1520   CO2 19 (L) 03/15/2016 0953   BUN 11 06/28/2022 1520   BUN 16 08/14/2020 1428   BUN 15.9 03/15/2016 0953   CREATININE 1.06 (H) 06/28/2022 1520   CREATININE 1.4 (H) 03/15/2016 0953      Component Value Date/Time   CALCIUM 8.5 (L) 06/28/2022 1520   CALCIUM 9.3 03/15/2016 0953   ALKPHOS 36 (L) 06/28/2022 1520   ALKPHOS 55 03/15/2016 0953   AST 14 (L) 06/28/2022 1520   AST 17 03/15/2016 0953   ALT 8 06/28/2022  1520   ALT 23 03/15/2016 0953   BILITOT 0.3 06/28/2022 1520   BILITOT 0.38 03/15/2016 0953       Impression and Plan: [name] is a very pleasant 62 yo caucasian female with recurrent granulosa cell tumor.  Thankfully, it has been quite a while since we have treated her.  I think her last chemotherapy was about 4 years ago.  Again I would like to try to avoid chemotherapy if possible.  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  As such, we could consider using erdatifinib.  Again there is really no information about using this with granulosa cell tumors.  As such, I think that immunotherapy would be a good way to try to help her right now.  We could always consider chemotherapy but again this would really impact her quality of life and a negative manner.  I talked her about the side effects of immunotherapy.  She has a very good understanding of this.  We will try to get started next week.  I will plan to see her back in 4 weeks for her second cycle of treatment.  I know that [name] is doing a great job.  She is doing her best.  She is trying all that she needs to do.  She is active.  She has a grandchild.  She is working.      Volanda Napoleon, MD 1/17/202411:53 AM

## 2022-07-12 ENCOUNTER — Other Ambulatory Visit: Payer: Self-pay

## 2022-07-15 NOTE — Progress Notes (Unsigned)
Pharmacist Chemotherapy Monitoring - Initial Assessment    Anticipated start date: 07/17/22   The following has been reviewed per standard work regarding the patient's treatment regimen: The patient's diagnosis, treatment plan and drug doses, and organ/hematologic function Lab orders and baseline tests specific to treatment regimen  The treatment plan start date, drug sequencing, and pre-medications Prior authorization status  Patient's documented medication list, including drug-drug interaction screen and prescriptions for anti-emetics and supportive care specific to the treatment regimen The drug concentrations, fluid compatibility, administration routes, and timing of the medications to be used The patient's access for treatment and lifetime cumulative dose history, if applicable  The patient's medication allergies and previous infusion related reactions, if applicable   Changes made to treatment plan:  N/A  Follow up needed:  N/A   Claybon Jabs, [facility], 07/15/2022  1:33 PM

## 2022-07-17 ENCOUNTER — Telehealth: Payer: Self-pay | Admitting: *Deleted

## 2022-07-17 ENCOUNTER — Inpatient Hospital Stay: Payer: 59

## 2022-07-17 DIAGNOSIS — Z5112 Encounter for antineoplastic immunotherapy: Secondary | ICD-10-CM | POA: Diagnosis not present

## 2022-07-17 DIAGNOSIS — D3910 Neoplasm of uncertain behavior of unspecified ovary: Secondary | ICD-10-CM

## 2022-07-17 LAB — CBC WITH DIFFERENTIAL (CANCER CENTER ONLY)
Abs Immature Granulocytes: 0.02 10*3/uL (ref 0.00–0.07)
Basophils Absolute: 0 10*3/uL (ref 0.0–0.1)
Basophils Relative: 1 %
Eosinophils Absolute: 0.1 10*3/uL (ref 0.0–0.5)
Eosinophils Relative: 1 %
HCT: 37.3 % (ref 36.0–46.0)
Hemoglobin: 12.3 g/dL (ref 12.0–15.0)
Immature Granulocytes: 0 %
Lymphocytes Relative: 7 %
Lymphs Abs: 0.4 10*3/uL — ABNORMAL LOW (ref 0.7–4.0)
MCH: 31 pg (ref 26.0–34.0)
MCHC: 33 g/dL (ref 30.0–36.0)
MCV: 94 fL (ref 80.0–100.0)
Monocytes Absolute: 0.7 10*3/uL (ref 0.1–1.0)
Monocytes Relative: 11 %
Neutro Abs: 4.9 10*3/uL (ref 1.7–7.7)
Neutrophils Relative %: 80 %
Platelet Count: 155 10*3/uL (ref 150–400)
RBC: 3.97 MIL/uL (ref 3.87–5.11)
RDW: 12.5 % (ref 11.5–15.5)
WBC Count: 6.2 10*3/uL (ref 4.0–10.5)
nRBC: 0 % (ref 0.0–0.2)

## 2022-07-17 LAB — CMP (CANCER CENTER ONLY)
ALT: 10 U/L (ref 0–44)
AST: 15 U/L (ref 15–41)
Albumin: 4 g/dL (ref 3.5–5.0)
Alkaline Phosphatase: 40 U/L (ref 38–126)
Anion gap: 9 (ref 5–15)
BUN: 13 mg/dL (ref 8–23)
CO2: 27 mmol/L (ref 22–32)
Calcium: 9 mg/dL (ref 8.9–10.3)
Chloride: 103 mmol/L (ref 98–111)
Creatinine: 1.18 mg/dL — ABNORMAL HIGH (ref 0.44–1.00)
GFR, Estimated: 52 mL/min — ABNORMAL LOW (ref >60)
Glucose, Bld: 102 mg/dL — ABNORMAL HIGH (ref 70–99)
Potassium: 3.6 mmol/L (ref 3.5–5.1)
Sodium: 139 mmol/L (ref 135–145)
Total Bilirubin: 0.3 mg/dL (ref 0.3–1.2)
Total Protein: 6.2 g/dL — ABNORMAL LOW (ref 6.5–8.1)

## 2022-07-17 LAB — TSH: TSH: 3.708 u[IU]/mL (ref 0.350–4.500)

## 2022-07-17 MED ORDER — SODIUM CHLORIDE 0.9% FLUSH
10.0000 mL | INTRAVENOUS | Status: DC | PRN
  Administered 2022-07-17: 10 mL

## 2022-07-17 MED ORDER — SODIUM CHLORIDE 0.9 % IV SOLN
200.0000 mg | Freq: Once | INTRAVENOUS | Status: AC
  Administered 2022-07-17: 200 mg via INTRAVENOUS
  Filled 2022-07-17: qty 8

## 2022-07-17 MED ORDER — HEPARIN SOD (PORK) LOCK FLUSH 100 UNIT/ML IV SOLN
500.0000 [IU] | Freq: Once | INTRAVENOUS | Status: AC | PRN
  Administered 2022-07-17: 500 [IU]

## 2022-07-17 MED ORDER — SODIUM CHLORIDE 0.9 % IV SOLN: Freq: Once | INTRAVENOUS | Status: AC

## 2022-07-17 NOTE — Telephone Encounter (Signed)
Faxed referral to [facility] to [phone]

## 2022-07-17 NOTE — Patient Instructions (Signed)

## 2022-07-17 NOTE — Patient Instructions (Signed)
[facility]  Discharge Instructions: Thank you for choosing [facility] to provide your oncology and hematology care.   If you have a lab appointment with the Cancer Center, please go directly to the Cancer Center and check in at the registration area.  Wear comfortable clothing and clothing appropriate for easy access to any Portacath or PICC line.   We strive to give you quality time with your provider. You may need to reschedule your appointment if you arrive late (15 or more minutes).  Arriving late affects you and other patients whose appointments are after yours.  Also, if you miss three or more appointments without notifying the office, you may be dismissed from the clinic at the provider's discretion.      For prescription refill requests, have your pharmacy contact our office and allow 72 hours for refills to be completed.    Today you received the following chemotherapy and/or immunotherapy agents Keytruda      To help prevent nausea and vomiting after your treatment, we encourage you to take your nausea medication as directed.  BELOW ARE SYMPTOMS THAT SHOULD BE REPORTED IMMEDIATELY: *FEVER GREATER THAN 100.4 F (38 C) OR HIGHER *CHILLS OR SWEATING *NAUSEA AND VOMITING THAT IS NOT CONTROLLED WITH YOUR NAUSEA MEDICATION *UNUSUAL SHORTNESS OF BREATH *UNUSUAL BRUISING OR BLEEDING *URINARY PROBLEMS (pain or burning when urinating, or frequent urination) *BOWEL PROBLEMS (unusual diarrhea, constipation, pain near the anus) TENDERNESS IN MOUTH AND THROAT WITH OR WITHOUT PRESENCE OF ULCERS (sore throat, sores in mouth, or a toothache) UNUSUAL RASH, SWELLING OR PAIN  UNUSUAL VAGINAL DISCHARGE OR ITCHING   Items with * indicate a potential emergency and should be followed up as soon as possible or go to the Emergency Department if any problems should occur.  Please show the CHEMOTHERAPY ALERT CARD or IMMUNOTHERAPY ALERT CARD at check-in  to the Emergency Department and triage nurse. Should you have questions after your visit or need to cancel or reschedule your appointment, please contact [facility]  [phone] and follow the prompts.  Office hours are 8:00 a.m. to 4:30 p.m. Monday - Friday. Please note that voicemails left after 4:00 p.m. may not be returned until the following business day.  We are closed weekends and major holidays. You have access to a nurse at all times for urgent questions. Please call the main number to the clinic [phone] and follow the prompts.  For any non-urgent questions, you may also contact your provider using MyChart. We now offer e-Visits for anyone 18 and older to request care online for non-urgent symptoms. For details visit [email or web address].   Also download the MyChart app! Go to the app store, search "MyChart", open the app, select [facility], and log in with your MyChart username and password.   

## 2022-07-19 LAB — T4: T4, Total: 5.7 ug/dL (ref 4.5–12.0)

## 2022-07-23 ENCOUNTER — Other Ambulatory Visit: Payer: Self-pay | Admitting: Hematology & Oncology

## 2022-07-25 NOTE — Progress Notes (Signed)
Remote ICD transmission.   

## 2022-07-30 ENCOUNTER — Other Ambulatory Visit: Payer: Self-pay | Admitting: Hematology & Oncology

## 2022-07-30 DIAGNOSIS — D3910 Neoplasm of uncertain behavior of unspecified ovary: Secondary | ICD-10-CM

## 2022-07-30 DIAGNOSIS — F419 Anxiety disorder, unspecified: Secondary | ICD-10-CM

## 2022-08-02 ENCOUNTER — Other Ambulatory Visit: Payer: Self-pay | Admitting: Hematology & Oncology

## 2022-08-02 DIAGNOSIS — D3910 Neoplasm of uncertain behavior of unspecified ovary: Secondary | ICD-10-CM

## 2022-08-07 ENCOUNTER — Other Ambulatory Visit: Payer: Self-pay

## 2022-08-07 ENCOUNTER — Inpatient Hospital Stay (HOSPITAL_BASED_OUTPATIENT_CLINIC_OR_DEPARTMENT_OTHER): Payer: 59 | Admitting: Hematology & Oncology

## 2022-08-07 ENCOUNTER — Inpatient Hospital Stay: Payer: 59

## 2022-08-07 ENCOUNTER — Inpatient Hospital Stay: Payer: 59 | Attending: Hematology & Oncology

## 2022-08-07 ENCOUNTER — Encounter: Payer: Self-pay | Admitting: Hematology & Oncology

## 2022-08-07 VITALS — BP 90/60 | HR 86 | Temp 97.9°F | Resp 18 | Ht 66.0 in | Wt 174.1 lb

## 2022-08-07 VITALS — BP 107/62 | HR 77 | Resp 18

## 2022-08-07 DIAGNOSIS — D3910 Neoplasm of uncertain behavior of unspecified ovary: Secondary | ICD-10-CM | POA: Insufficient documentation

## 2022-08-07 DIAGNOSIS — K909 Intestinal malabsorption, unspecified: Secondary | ICD-10-CM | POA: Insufficient documentation

## 2022-08-07 DIAGNOSIS — Z79899 Other long term (current) drug therapy: Secondary | ICD-10-CM | POA: Diagnosis not present

## 2022-08-07 DIAGNOSIS — Z952 Presence of prosthetic heart valve: Secondary | ICD-10-CM | POA: Insufficient documentation

## 2022-08-07 DIAGNOSIS — I480 Paroxysmal atrial fibrillation: Secondary | ICD-10-CM | POA: Insufficient documentation

## 2022-08-07 DIAGNOSIS — D508 Other iron deficiency anemias: Secondary | ICD-10-CM | POA: Insufficient documentation

## 2022-08-07 DIAGNOSIS — Z953 Presence of xenogenic heart valve: Secondary | ICD-10-CM | POA: Insufficient documentation

## 2022-08-07 DIAGNOSIS — Z5112 Encounter for antineoplastic immunotherapy: Secondary | ICD-10-CM | POA: Diagnosis present

## 2022-08-07 DIAGNOSIS — Z7901 Long term (current) use of anticoagulants: Secondary | ICD-10-CM | POA: Insufficient documentation

## 2022-08-07 DIAGNOSIS — C569 Malignant neoplasm of unspecified ovary: Secondary | ICD-10-CM | POA: Insufficient documentation

## 2022-08-07 LAB — CMP (CANCER CENTER ONLY)
ALT: 9 U/L (ref 0–44)
AST: 13 U/L — ABNORMAL LOW (ref 15–41)
Albumin: 3.8 g/dL (ref 3.5–5.0)
Alkaline Phosphatase: 34 U/L — ABNORMAL LOW (ref 38–126)
Anion gap: 8 (ref 5–15)
BUN: 11 mg/dL (ref 8–23)
CO2: 27 mmol/L (ref 22–32)
Calcium: 8.6 mg/dL — ABNORMAL LOW (ref 8.9–10.3)
Chloride: 102 mmol/L (ref 98–111)
Creatinine: 0.97 mg/dL (ref 0.44–1.00)
GFR, Estimated: >60 mL/min (ref >60)
Glucose, Bld: 96 mg/dL (ref 70–99)
Potassium: 3.5 mmol/L (ref 3.5–5.1)
Sodium: 137 mmol/L (ref 135–145)
Total Bilirubin: 0.3 mg/dL (ref 0.3–1.2)
Total Protein: 6.1 g/dL — ABNORMAL LOW (ref 6.5–8.1)

## 2022-08-07 LAB — IRON AND IRON BINDING CAPACITY (CC-WL,HP ONLY)
Iron: 55 ug/dL (ref 28–170)
Saturation Ratios: 18 % (ref 10.4–31.8)
TIBC: 315 ug/dL (ref 250–450)
UIBC: 260 ug/dL (ref 148–442)

## 2022-08-07 LAB — CBC WITH DIFFERENTIAL (CANCER CENTER ONLY)
Abs Immature Granulocytes: 0.03 10*3/uL (ref 0.00–0.07)
Basophils Absolute: 0 10*3/uL (ref 0.0–0.1)
Basophils Relative: 1 %
Eosinophils Absolute: 0.1 10*3/uL (ref 0.0–0.5)
Eosinophils Relative: 2 %
HCT: 34.5 % — ABNORMAL LOW (ref 36.0–46.0)
Hemoglobin: 11.4 g/dL — ABNORMAL LOW (ref 12.0–15.0)
Immature Granulocytes: 1 %
Lymphocytes Relative: 7 %
Lymphs Abs: 0.5 10*3/uL — ABNORMAL LOW (ref 0.7–4.0)
MCH: 30.8 pg (ref 26.0–34.0)
MCHC: 33 g/dL (ref 30.0–36.0)
MCV: 93.2 fL (ref 80.0–100.0)
Monocytes Absolute: 0.8 10*3/uL (ref 0.1–1.0)
Monocytes Relative: 12 %
Neutro Abs: 5.2 10*3/uL (ref 1.7–7.7)
Neutrophils Relative %: 77 %
Platelet Count: 150 10*3/uL (ref 150–400)
RBC: 3.7 MIL/uL — ABNORMAL LOW (ref 3.87–5.11)
RDW: 12.5 % (ref 11.5–15.5)
WBC Count: 6.6 10*3/uL (ref 4.0–10.5)
nRBC: 0 % (ref 0.0–0.2)

## 2022-08-07 LAB — FERRITIN: Ferritin: 27 ng/mL (ref 11–307)

## 2022-08-07 LAB — RETICULOCYTES
Immature Retic Fract: 12.7 % (ref 2.3–15.9)
RBC.: 3.67 MIL/uL — ABNORMAL LOW (ref 3.87–5.11)
Retic Count, Absolute: 54.7 10*3/uL (ref 19.0–186.0)
Retic Ct Pct: 1.5 % (ref 0.4–3.1)

## 2022-08-07 LAB — LACTATE DEHYDROGENASE: LDH: 170 U/L (ref 98–192)

## 2022-08-07 MED ORDER — SODIUM CHLORIDE 0.9% FLUSH
10.0000 mL | INTRAVENOUS | Status: DC | PRN
  Administered 2022-08-07: 10 mL

## 2022-08-07 MED ORDER — SODIUM CHLORIDE 0.9 % IV SOLN: Freq: Once | INTRAVENOUS | Status: AC

## 2022-08-07 MED ORDER — SODIUM CHLORIDE 0.9 % IV SOLN
200.0000 mg | Freq: Once | INTRAVENOUS | Status: AC
  Administered 2022-08-07: 200 mg via INTRAVENOUS
  Filled 2022-08-07: qty 8

## 2022-08-07 MED ORDER — HEPARIN SOD (PORK) LOCK FLUSH 100 UNIT/ML IV SOLN
500.0000 [IU] | Freq: Once | INTRAVENOUS | Status: AC | PRN
  Administered 2022-08-07: 500 [IU]

## 2022-08-07 NOTE — Patient Instructions (Signed)

## 2022-08-07 NOTE — Progress Notes (Signed)
Hematology and Oncology Follow Up Visit  JULIE-ANNE POPESCU [id number] 1961/04/11 62 y.o. 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 Wolfgram is here today for follow-up.  She had her first cycle of pembrolizumab.  She tolerated this quite well.  She had no problems with diarrhea.  She has had no nausea or vomiting.  She does have some arthralgias which have been chronic.  She had an elevated Inhibin B level which is why we started her on treatment.  I think that it may be a while before we see a response if we do see a response.  She has had no abdominal pain.  There has been no cough or shortness of breath.  She has had no rashes.  She has no fever.  She has had no headache.  Cardiac wise she is doing pretty well.  Last iron level back in January showed a ferritin of 17 with an iron saturation of 12%.  We did give her dose of IV iron.  Overall, I would say that her performance status is probably ECOG 1.     Medications:  Allergies as of 08/07/2022       Reactions   Augmentin [amoxicillin-pot Clavulanate] Other (See Comments)   Muscle Aches   Prochlorperazine Edisylate Other (See Comments)   Nervous/ flutter/ shakes   Adhesive [tape] Hives   Redness/hives from adhesive tape( tolerates latex gloves); tolerates paper tape        Medication List        Accurate as of August 07, 2022 10:07 AM. If you have any questions, ask your nurse or doctor.          albuterol 108 (90 Base) MCG/ACT inhaler Commonly known as: VENTOLIN HFA TAKE 2 PUFFS BY MOUTH EVERY 6 HOURS AS NEEDED FOR WHEEZE OR  SHORTNESS OF BREATH   amoxicillin 500 MG tablet Commonly known as: AMOXIL Take 2,000 mg by mouth See admin instructions. Take 2,000 mg one hour prior to dental visits.   aspirin EC 81 MG tablet Take 81 mg by mouth daily.   b complex vitamins capsule Take 1 capsule by mouth 2 (two) times daily.   carvedilol 3.125 MG tablet Commonly known as: COREG Take 1 tablet (3.125 mg total) by mouth 2 (two) times daily with a meal.   cholecalciferol 25 MCG (1000 UNIT) tablet Commonly known as: VITAMIN D3 Take 4,000 Units by mouth daily.   furosemide 20 MG tablet Commonly known as: LASIX Take 1 tablet (20 mg total) by mouth daily.   lidocaine-prilocaine cream Commonly known as: EMLA Apply 1 application topically as needed. Apply to port per instructions   loperamide 2 MG tablet Commonly known as: IMODIUM A-D Take 4 mg by mouth 3 (three) times daily as needed for diarrhea or loose stools.   LORazepam 0.5 MG tablet Commonly known as: ATIVAN TAKE ONE TABLET BY MOUTH EVERY 8 HOURS   methocarbamol 500 MG tablet Commonly known as: ROBAXIN TAKE ONE TABLET BY MOUTH EVERY 6 HOURS AS NEEDED FOR MUSCLE SPASMS   Potassium Chloride ER 20 MEQ Tbcr Take 20 mEq by  mouth daily.   spironolactone 25 MG tablet Commonly known as: ALDACTONE Take 1 tablet (25 mg total) by mouth daily.   traMADol 50 MG tablet Commonly known as: ULTRAM TAKE ONE TABLET BY MOUTH EVERY 6 HOURS AS NEEDED   Vitamin D (Ergocalciferol) 1.25 MG (50000 UNIT) Caps capsule Commonly known as: DRISDOL TAKE ONE CAPSULE BY MOUTH every 7 DAYS   warfarin 7.5 MG tablet Commonly known as: COUMADIN Take as directed by the anticoagulation clinic. If you are unsure how to take this medication, talk to your nurse or doctor. Original instructions: Take 1 tablet by mouth daily except 1/2 tablet on Monday and Fridays or as directed by Anticoagulation Clinic. What changed: additional instructions   zolpidem 10 MG tablet Commonly known  as: AMBIEN TAKE ONE TABLET BY MOUTH EVERY DAY        Allergies:  Allergies  Allergen Reactions   Augmentin [Amoxicillin-Pot Clavulanate] Other (See Comments)    Muscle Aches   Prochlorperazine Edisylate Other (See Comments)    Nervous/ flutter/ shakes   Adhesive [Tape] Hives    Redness/hives from adhesive tape( tolerates latex gloves); tolerates paper tape    Past Medical History, Surgical history, Social history, and Family History were reviewed and updated.  Review of Systems:  Review of Systems  Constitutional: Negative.   HENT: Negative.    Eyes: Negative.   Respiratory: Negative.    Cardiovascular: Negative.   Gastrointestinal: Negative.   Genitourinary: Negative.   Musculoskeletal: Negative.   Skin: Negative.   Neurological: Negative.   Endo/Heme/Allergies: Negative.   Psychiatric/Behavioral: Negative.        Exam:  height is 5' 6"$  (1.676 m) and weight is 174 lb 1.3 oz (79 kg). Her oral temperature is 97.9 F (36.6 C). Her blood pressure is 90/60 and her pulse is 86. Her respiration is 18 and oxygen saturation is 100%.   Wt Readings from Last 3 Encounters:  08/07/22 174 lb 1.3 oz (79 kg)  06/28/22 174 lb (78.9 kg)  03/08/22 172 lb (78 kg)    Physical Exam Vitals reviewed.  HENT:     Head: Normocephalic and atraumatic.  Eyes:     Pupils: Pupils are equal, round, and reactive to light.  Cardiovascular:     Rate and Rhythm: Normal rate and regular rhythm.     Heart sounds: Normal heart sounds.  Pulmonary:     Effort: Pulmonary effort is normal.     Breath sounds: Normal breath sounds.  Abdominal:     General: Bowel sounds are normal.     Palpations: Abdomen is soft.  Musculoskeletal:        General: No tenderness or deformity. Normal range of motion.     Cervical back: Normal range of motion.  Lymphadenopathy:     Cervical: No cervical adenopathy.  Skin:    General: Skin is warm and dry.     Findings: No erythema or rash.  Neurological:      Mental Status: She is alert and oriented to person, place, and time.  Psychiatric:        Behavior: Behavior normal.        Thought Content: Thought content normal.        Judgment: Judgment normal.      Lab Results  Component Value Date   WBC 6.6 08/07/2022   HGB 11.4 (L) 08/07/2022   HCT 34.5 (L) 08/07/2022   MCV 93.2 08/07/2022   PLT 150 08/07/2022   Lab Results  Component Value Date  FERRITIN 17 06/28/2022   IRON 42 06/28/2022   TIBC 346 06/28/2022   UIBC 304 06/28/2022   IRONPCTSAT 12 06/28/2022   Lab Results  Component Value Date   RETICCTPCT 1.5 08/07/2022   RBC 3.70 (L) 08/07/2022   RBC 3.67 (L) 08/07/2022   No results found for: "KPAFRELGTCHN", "LAMBDASER", "KAPLAMBRATIO" No results found for: "IGGSERUM", "IGA", "IGMSERUM" No results found for: "TOTALPROTELP", "ALBUMINELP", "A1GS", "A2GS", "BETS", "BETA2SER", "GAMS", "MSPIKE", "SPEI"   Chemistry      Component Value Date/Time   NA 139 07/17/2022 0903   NA 139 08/14/2020 1428   NA 140 03/15/2016 0953   K 3.6 07/17/2022 0903   K 3.9 03/15/2016 0953   CL 103 07/17/2022 0903   CL 102 12/13/2015 0803   CO2 27 07/17/2022 0903   CO2 19 (L) 03/15/2016 0953   BUN 13 07/17/2022 0903   BUN 16 08/14/2020 1428   BUN 15.9 03/15/2016 0953   CREATININE 1.18 (H) 07/17/2022 0903   CREATININE 1.4 (H) 03/15/2016 0953      Component Value Date/Time   CALCIUM 9.0 07/17/2022 0903   CALCIUM 9.3 03/15/2016 0953   ALKPHOS 40 07/17/2022 0903   ALKPHOS 55 03/15/2016 0953   AST 15 07/17/2022 0903   AST 17 03/15/2016 0953   ALT 10 07/17/2022 0903   ALT 23 03/15/2016 0953   BILITOT 0.3 07/17/2022 0903   BILITOT 0.38 03/15/2016 0953       Impression and Plan: Ms. Mleczko is a very pleasant 62 yo caucasian female with recurrent granulosa cell tumor.  Thankfully, it has been quite a while since we have treated her.  I think her last chemotherapy was about 4 years ago.  Again I would like to try to avoid chemotherapy if  possible.  Hopefully, we will see a response with immunotherapy.  Again, it might take several cycles before we see this.  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  As such, we could consider using erdatifinib.  Again there is really no information about using this with granulosa cell tumors.  As such, I think that immunotherapy would be a good way to try to help her right now.  I will plan to have her come back to see [place] in another 3 weeks.  Volanda Napoleon, MD 2/14/202410:07 AM

## 2022-08-09 LAB — INHIBIN B: Inhibin B: 188.5 pg/mL — ABNORMAL HIGH (ref 0.0–16.9)

## 2022-08-12 ENCOUNTER — Inpatient Hospital Stay: Payer: 59

## 2022-08-16 ENCOUNTER — Ambulatory Visit: Payer: 59 | Attending: Cardiovascular Disease

## 2022-08-16 DIAGNOSIS — Z952 Presence of prosthetic heart valve: Secondary | ICD-10-CM

## 2022-08-16 DIAGNOSIS — Z953 Presence of xenogenic heart valve: Secondary | ICD-10-CM | POA: Diagnosis not present

## 2022-08-16 DIAGNOSIS — I480 Paroxysmal atrial fibrillation: Secondary | ICD-10-CM | POA: Diagnosis not present

## 2022-08-16 LAB — POCT INR: INR: 2.4 (ref 2.0–3.0)

## 2022-08-16 NOTE — Patient Instructions (Signed)
Description   DOES NOT NEED A PRINTOUT. Continue taking warfarin 1 tablet daily except for 1/2 tablet on Monday, Wednesday and Friday. Remain consistent with leafy veggeis.  Recheck INR in 6 weeks.  Coumadin Clinic [phone]

## 2022-08-23 ENCOUNTER — Other Ambulatory Visit: Payer: Self-pay | Admitting: Hematology & Oncology

## 2022-08-23 DIAGNOSIS — D3910 Neoplasm of uncertain behavior of unspecified ovary: Secondary | ICD-10-CM

## 2022-08-27 ENCOUNTER — Encounter: Payer: Self-pay | Admitting: Hematology & Oncology

## 2022-08-27 ENCOUNTER — Inpatient Hospital Stay: Payer: 59 | Attending: Hematology & Oncology

## 2022-08-27 ENCOUNTER — Inpatient Hospital Stay: Payer: 59

## 2022-08-27 ENCOUNTER — Other Ambulatory Visit: Payer: Self-pay | Admitting: Hematology & Oncology

## 2022-08-27 ENCOUNTER — Inpatient Hospital Stay (HOSPITAL_BASED_OUTPATIENT_CLINIC_OR_DEPARTMENT_OTHER): Payer: 59 | Admitting: Hematology & Oncology

## 2022-08-27 VITALS — BP 108/63

## 2022-08-27 VITALS — BP 87/64 | HR 73 | Temp 98.1°F | Resp 17 | Ht 66.0 in | Wt 172.8 lb

## 2022-08-27 DIAGNOSIS — K909 Intestinal malabsorption, unspecified: Secondary | ICD-10-CM | POA: Diagnosis not present

## 2022-08-27 DIAGNOSIS — F419 Anxiety disorder, unspecified: Secondary | ICD-10-CM | POA: Diagnosis not present

## 2022-08-27 DIAGNOSIS — D3910 Neoplasm of uncertain behavior of unspecified ovary: Secondary | ICD-10-CM

## 2022-08-27 DIAGNOSIS — D508 Other iron deficiency anemias: Secondary | ICD-10-CM | POA: Diagnosis present

## 2022-08-27 DIAGNOSIS — Z5112 Encounter for antineoplastic immunotherapy: Secondary | ICD-10-CM | POA: Insufficient documentation

## 2022-08-27 DIAGNOSIS — Z79899 Other long term (current) drug therapy: Secondary | ICD-10-CM | POA: Diagnosis not present

## 2022-08-27 DIAGNOSIS — Z79620 Long term (current) use of immunosuppressive biologic: Secondary | ICD-10-CM | POA: Insufficient documentation

## 2022-08-27 DIAGNOSIS — C569 Malignant neoplasm of unspecified ovary: Secondary | ICD-10-CM | POA: Diagnosis present

## 2022-08-27 LAB — CMP (CANCER CENTER ONLY)
ALT: 10 U/L (ref 0–44)
AST: 14 U/L — ABNORMAL LOW (ref 15–41)
Albumin: 4 g/dL (ref 3.5–5.0)
Alkaline Phosphatase: 36 U/L — ABNORMAL LOW (ref 38–126)
Anion gap: 10 (ref 5–15)
BUN: 12 mg/dL (ref 8–23)
CO2: 28 mmol/L (ref 22–32)
Calcium: 10 mg/dL (ref 8.9–10.3)
Chloride: 100 mmol/L (ref 98–111)
Creatinine: 1.18 mg/dL — ABNORMAL HIGH (ref 0.44–1.00)
GFR, Estimated: 52 mL/min — ABNORMAL LOW (ref >60)
Glucose, Bld: 84 mg/dL (ref 70–99)
Potassium: 3.6 mmol/L (ref 3.5–5.1)
Sodium: 138 mmol/L (ref 135–145)
Total Bilirubin: 0.3 mg/dL (ref 0.3–1.2)
Total Protein: 6.5 g/dL (ref 6.5–8.1)

## 2022-08-27 LAB — CBC WITH DIFFERENTIAL (CANCER CENTER ONLY)
Abs Immature Granulocytes: 0.02 10*3/uL (ref 0.00–0.07)
Basophils Absolute: 0 10*3/uL (ref 0.0–0.1)
Basophils Relative: 0 %
Eosinophils Absolute: 0.1 10*3/uL (ref 0.0–0.5)
Eosinophils Relative: 1 %
HCT: 35.7 % — ABNORMAL LOW (ref 36.0–46.0)
Hemoglobin: 11.9 g/dL — ABNORMAL LOW (ref 12.0–15.0)
Immature Granulocytes: 0 %
Lymphocytes Relative: 8 %
Lymphs Abs: 0.7 10*3/uL (ref 0.7–4.0)
MCH: 30.7 pg (ref 26.0–34.0)
MCHC: 33.3 g/dL (ref 30.0–36.0)
MCV: 92.2 fL (ref 80.0–100.0)
Monocytes Absolute: 0.8 10*3/uL (ref 0.1–1.0)
Monocytes Relative: 10 %
Neutro Abs: 6.4 10*3/uL (ref 1.7–7.7)
Neutrophils Relative %: 81 %
Platelet Count: 176 10*3/uL (ref 150–400)
RBC: 3.87 MIL/uL (ref 3.87–5.11)
RDW: 12.6 % (ref 11.5–15.5)
WBC Count: 8 10*3/uL (ref 4.0–10.5)
nRBC: 0 % (ref 0.0–0.2)

## 2022-08-27 LAB — IRON AND IRON BINDING CAPACITY (CC-WL,HP ONLY)
Iron: 58 ug/dL (ref 28–170)
Saturation Ratios: 16 % (ref 10.4–31.8)
TIBC: 356 ug/dL (ref 250–450)
UIBC: 298 ug/dL (ref 148–442)

## 2022-08-27 LAB — FERRITIN: Ferritin: 17 ng/mL (ref 11–307)

## 2022-08-27 LAB — LACTATE DEHYDROGENASE: LDH: 162 U/L (ref 98–192)

## 2022-08-27 MED ORDER — SODIUM CHLORIDE 0.9 % IV SOLN
200.0000 mg | Freq: Once | INTRAVENOUS | Status: AC
  Administered 2022-08-27: 200 mg via INTRAVENOUS
  Filled 2022-08-27: qty 8

## 2022-08-27 MED ORDER — LORAZEPAM 1 MG PO TABS: 1.0000 mg | ORAL_TABLET | Freq: Three times a day (TID) | ORAL | 0 refills | Status: DC | PRN

## 2022-08-27 MED ORDER — SODIUM CHLORIDE 0.9 % IV SOLN: Freq: Once | INTRAVENOUS | Status: AC

## 2022-08-27 MED ORDER — HEPARIN SOD (PORK) LOCK FLUSH 100 UNIT/ML IV SOLN
500.0000 [IU] | Freq: Once | INTRAVENOUS | Status: AC | PRN
  Administered 2022-08-27 (×2): 500 [IU]

## 2022-08-27 MED ORDER — SODIUM CHLORIDE 0.9% FLUSH
10.0000 mL | INTRAVENOUS | Status: DC | PRN
  Administered 2022-08-27: 10 mL

## 2022-08-27 NOTE — Progress Notes (Signed)
Hematology and Oncology Follow Up Visit  [name] [id number] 03-Aug-1960 62 y.o. 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  She has tolerated the immunotherapy fairly well.  Her last inhibin B was still elevated.  It was 188.  Again, we really have to try to be patient with immunotherapy.  It will be interesting to see what her level is today.  She has had no abdominal pain.  She has had a little bit of diarrhea.  She has had no nausea or vomiting.  She has had no cough or shortness of breath.  There is been no cardiac issues.  She is on Coumadin..  She has had no bleeding.  There is been no leg swelling.  She has had no rashes.  Her last iron levels that were done about a month ago showed a ferritin of 27 with iron saturation of 18%.  She has had no fever.  She is still working.  A second grandson is going to be due in July.  Overall, I would have said that her performance status is probably ECOG 1.   Medications:  Allergies as of 08/27/2022       Reactions   Augmentin [amoxicillin-pot Clavulanate] Other (See Comments)   Muscle Aches   Prochlorperazine Edisylate Other (See Comments)   Nervous/ flutter/ shakes   Adhesive [tape] Hives   Redness/hives from adhesive tape( tolerates latex gloves); tolerates paper tape        Medication List        Accurate as of August 27, 2022  1:26 PM. If you have any questions, ask your nurse or doctor.          albuterol 108 (90 Base) MCG/ACT inhaler Commonly known as: VENTOLIN HFA TAKE 2 PUFFS BY MOUTH EVERY 6  HOURS AS NEEDED FOR WHEEZE OR SHORTNESS OF BREATH   amoxicillin 500 MG tablet Commonly known as: AMOXIL Take 2,000 mg by mouth See admin instructions. Take 2,000 mg one hour prior to dental visits.   aspirin EC 81 MG tablet Take 81 mg by mouth daily.   b complex vitamins capsule Take 1 capsule by mouth 2 (two) times daily.   carvedilol 3.125 MG tablet Commonly known as: COREG Take 1 tablet (3.125 mg total) by mouth 2 (two) times daily with a meal.   cholecalciferol 25 MCG (1000 UNIT) tablet Commonly known as: VITAMIN D3 Take 4,000 Units by mouth daily.   furosemide 20 MG tablet Commonly known as: LASIX Take 1 tablet (20 mg total) by mouth daily.   lidocaine-prilocaine cream Commonly known as: EMLA Apply 1 application topically as needed. Apply to port per instructions   loperamide 2 MG tablet Commonly known as: IMODIUM A-D Take 4 mg by mouth 3 (three) times daily as needed for diarrhea or loose stools.   LORazepam 0.5 MG tablet Commonly known as: ATIVAN TAKE ONE TABLET BY MOUTH EVERY 8 HOURS   methocarbamol 500 MG tablet Commonly known as: ROBAXIN TAKE ONE TABLET BY MOUTH EVERY 6 HOURS AS NEEDED FOR MUSCLE SPASMS   Potassium  Chloride ER 20 MEQ Tbcr Take 20 mEq by mouth daily.   spironolactone 25 MG tablet Commonly known as: ALDACTONE Take 1 tablet (25 mg total) by mouth daily.   traMADol 50 MG tablet Commonly known as: ULTRAM Take 1 tablet (50 mg total) by mouth every 6 (six) hours as needed.   Vitamin D (Ergocalciferol) 1.25 MG (50000 UNIT) Caps capsule Commonly known as: DRISDOL TAKE ONE CAPSULE BY MOUTH every 7 DAYS   warfarin 7.5 MG tablet Commonly known as: COUMADIN Take as directed by the anticoagulation clinic. If you are unsure how to take this medication, talk to your nurse or doctor. Original instructions: Take 1 tablet by mouth daily except 1/2 tablet on Monday and Fridays or as directed by Anticoagulation Clinic. What changed: additional  instructions   zolpidem 10 MG tablet Commonly known as: AMBIEN TAKE ONE TABLET BY MOUTH EVERY DAY        Allergies:  Allergies  Allergen Reactions   Augmentin [Amoxicillin-Pot Clavulanate] Other (See Comments)    Muscle Aches   Prochlorperazine Edisylate Other (See Comments)    Nervous/ flutter/ shakes   Adhesive [Tape] Hives    Redness/hives from adhesive tape( tolerates latex gloves); tolerates paper tape    Past Medical History, Surgical history, Social history, and Family History were reviewed and updated.  Review of Systems:  Review of Systems  Constitutional: Negative.   HENT: Negative.    Eyes: Negative.   Respiratory: Negative.    Cardiovascular: Negative.   Gastrointestinal: Negative.   Genitourinary: Negative.   Musculoskeletal: Negative.   Skin: Negative.   Neurological: Negative.   Endo/Heme/Allergies: Negative.   Psychiatric/Behavioral: Negative.        Exam:  height is '5\' 6"'$  (1.676 m) and weight is 172 lb 12.8 oz (78.4 kg). Her oral temperature is 98.1 F (36.7 C). Her blood pressure is 87/64 (abnormal) and her pulse is 73. Her respiration is 17 and oxygen saturation is 99%.   Wt Readings from Last 3 Encounters:  08/27/22 172 lb 12.8 oz (78.4 kg)  08/07/22 174 lb 1.3 oz (79 kg)  06/28/22 174 lb (78.9 kg)    Physical Exam Vitals reviewed.  HENT:     Head: Normocephalic and atraumatic.  Eyes:     Pupils: Pupils are equal, round, and reactive to light.  Cardiovascular:     Rate and Rhythm: Normal rate and regular rhythm.     Heart sounds: Normal heart sounds.  Pulmonary:     Effort: Pulmonary effort is normal.     Breath sounds: Normal breath sounds.  Abdominal:     General: Bowel sounds are normal.     Palpations: Abdomen is soft.  Musculoskeletal:        General: No tenderness or deformity. Normal range of motion.     Cervical back: Normal range of motion.  Lymphadenopathy:     Cervical: No cervical adenopathy.  Skin:    General:  Skin is warm and dry.     Findings: No erythema or rash.  Neurological:     Mental Status: She is alert and oriented to person, place, and time.  Psychiatric:        Behavior: Behavior normal.        Thought Content: Thought content normal.        Judgment: Judgment normal.      Lab Results  Component Value Date   WBC 8.0 08/27/2022   HGB 11.9 (L) 08/27/2022   HCT 35.7 (L) 08/27/2022  MCV 92.2 08/27/2022   PLT 176 08/27/2022   Lab Results  Component Value Date   FERRITIN 27 08/07/2022   IRON 55 08/07/2022   TIBC 315 08/07/2022   UIBC 260 08/07/2022   IRONPCTSAT 18 08/07/2022   Lab Results  Component Value Date   RETICCTPCT 1.5 08/07/2022   RBC 3.87 08/27/2022   No results found for: "KPAFRELGTCHN", "LAMBDASER", "KAPLAMBRATIO" No results found for: "IGGSERUM", "IGA", "IGMSERUM" No results found for: "TOTALPROTELP", "ALBUMINELP", "A1GS", "A2GS", "BETS", "BETA2SER", "GAMS", "MSPIKE", "SPEI"   Chemistry      Component Value Date/Time   NA 138 08/27/2022 1157   NA 139 08/14/2020 1428   NA 140 03/15/2016 0953   K 3.6 08/27/2022 1157   K 3.9 03/15/2016 0953   CL 100 08/27/2022 1157   CL 102 12/13/2015 0803   CO2 28 08/27/2022 1157   CO2 19 (L) 03/15/2016 0953   BUN 12 08/27/2022 1157   BUN 16 08/14/2020 1428   BUN 15.9 03/15/2016 0953   CREATININE 1.18 (H) 08/27/2022 1157   CREATININE 1.4 (H) 03/15/2016 0953      Component Value Date/Time   CALCIUM 10.0 08/27/2022 1157   CALCIUM 9.3 03/15/2016 0953   ALKPHOS 36 (L) 08/27/2022 1157   ALKPHOS 55 03/15/2016 0953   AST 14 (L) 08/27/2022 1157   AST 17 03/15/2016 0953   ALT 10 08/27/2022 1157   ALT 23 03/15/2016 0953   BILITOT 0.3 08/27/2022 1157   BILITOT 0.38 03/15/2016 0953       Impression and Plan: [name] is a very pleasant 62 yo caucasian female with recurrent granulosa cell tumor.  We now have her on immunotherapy.  We are trying to avoid systemic chemotherapy if possible.  We will go ahead with her  third cycle of treatment.  I want to try to get 4 cycles and then try to rescan her and see how everything looks.  Again, she does have the FGFR2 amplification.  As such, we might be able to utilize the targeted agent-  erdatifinib.  I would like to see her back in 3 weeks.  Again, the inhibin B level will be critical.   Volanda Napoleon, MD 3/5/20241:26 PM

## 2022-08-27 NOTE — Patient Instructions (Signed)
Implanted Port Removal, Care After The following information offers guidance on how to care for yourself after your procedure. Your health care provider may also give you more specific instructions. If you have problems or questions, contact your health care provider. What can I expect after the procedure? After the procedure, it is common to have: Soreness or pain near your incision. Some swelling or bruising near your incision. Follow these instructions at home: Medicines Take over-the-counter and prescription medicines only as told by your health care provider. If you were prescribed an antibiotic medicine, take it as told by your health care provider. Do not stop taking the antibiotic even if you start to feel better. Bathing Do not take baths, swim, or use a hot tub until your health care provider approves. Ask your health care provider if you can take showers. You may only be allowed to take sponge baths. Incision care  Follow instructions from your health care provider about how to take care of your incision. Make sure you: Wash your hands with soap and water for at least 20 seconds before and after you change your bandage (dressing). If soap and water are not available, use hand sanitizer. Change your dressing as told by your health care provider. Keep your dressing dry. Leave stitches (sutures), skin glue, or adhesive strips in place. These skin closures may need to stay in place for 2 weeks or longer. If adhesive strip edges start to loosen and curl up, you may trim the loose edges. Do not remove adhesive strips completely unless your health care provider tells you to do that. Check your incision area every day for signs of infection. Check for: More redness, swelling, or pain. More fluid or blood. Warmth. Pus or a bad smell. Activity Return to your normal activities as told by your health care provider. Ask your health care provider what activities are safe for you. You may have  to avoid lifting. Ask your health care provider how much you can safely lift. Do not do activities that involve lifting your arms over your head. Driving  If you were given a sedative during the procedure, it can affect you for several hours. Do not drive or operate machinery until your health care provider says that it is safe. If you did not receive a sedative, ask your health care provider when it is safe to drive. General instructions Do not use any products that contain nicotine or tobacco. These products include cigarettes, chewing tobacco, and vaping devices, such as e-cigarettes. These can delay healing after surgery. If you need help quitting, ask your health care provider. Keep all follow-up visits. This is important. Contact a health care provider if: You have a fever or chills. You have more redness, swelling, or pain around your incision. You have more fluid or blood coming from your incision. Your incision feels warm to the touch. You have pus or a bad smell coming from your incision. You have pain that is not relieved by your pain medicine. Get help right away if: You have chest pain. You have difficulty breathing. These symptoms may be an emergency. Get help right away. Call 911. Do not wait to see if the symptoms will go away. Do not drive yourself to the hospital. Summary After the procedure, it is common to have pain, soreness, swelling, or bruising near your incision. If you were prescribed an antibiotic medicine, take it as told by your health care provider. Do not stop taking the antibiotic even if you  start to feel better. If you were given a sedative during the procedure, it can affect you for several hours. Do not drive or operate machinery until your health care provider says that it is safe. Return to your normal activities as told by your health care provider. Ask your health care provider what activities are safe for you. This information is not intended to  replace advice given to you by your health care provider. Make sure you discuss any questions you have with your health care provider. Document Revised: 12/12/2020 Document Reviewed: 12/12/2020 Elsevier Patient Education  [place].

## 2022-08-29 ENCOUNTER — Encounter: Payer: Self-pay | Admitting: *Deleted

## 2022-08-29 ENCOUNTER — Other Ambulatory Visit (HOSPITAL_COMMUNITY): Payer: 59

## 2022-08-29 LAB — INHIBIN B: Inhibin B: 158.2 pg/mL — ABNORMAL HIGH (ref 0.0–16.9)

## 2022-09-02 ENCOUNTER — Other Ambulatory Visit: Payer: Self-pay | Admitting: Physician Assistant

## 2022-09-02 ENCOUNTER — Other Ambulatory Visit: Payer: Self-pay | Admitting: Hematology & Oncology

## 2022-09-02 DIAGNOSIS — D3910 Neoplasm of uncertain behavior of unspecified ovary: Secondary | ICD-10-CM

## 2022-09-02 NOTE — Telephone Encounter (Signed)
Patient of Dr. Tamala Julian, scheduled to f/u with Nicholes Rough, PA on 3/27. Please review for refill. Thank you!

## 2022-09-03 ENCOUNTER — Inpatient Hospital Stay: Payer: 59

## 2022-09-03 ENCOUNTER — Other Ambulatory Visit: Payer: Self-pay | Admitting: Oncology

## 2022-09-03 VITALS — BP 118/64 | HR 74 | Temp 98.8°F | Resp 18

## 2022-09-03 DIAGNOSIS — Z5112 Encounter for antineoplastic immunotherapy: Secondary | ICD-10-CM | POA: Diagnosis not present

## 2022-09-03 DIAGNOSIS — Z95828 Presence of other vascular implants and grafts: Secondary | ICD-10-CM

## 2022-09-03 DIAGNOSIS — D6481 Anemia due to antineoplastic chemotherapy: Secondary | ICD-10-CM

## 2022-09-03 MED ORDER — HEPARIN SOD (PORK) LOCK FLUSH 100 UNIT/ML IV SOLN
500.0000 [IU] | Freq: Once | INTRAVENOUS | Status: AC
  Administered 2022-09-03: 500 [IU] via INTRAVENOUS

## 2022-09-03 MED ORDER — LIDOCAINE-PRILOCAINE 2.5-2.5 % EX CREA: 1.0000 | TOPICAL_CREAM | CUTANEOUS | 12 refills | Status: AC | PRN

## 2022-09-03 MED ORDER — SODIUM CHLORIDE 0.9% FLUSH
10.0000 mL | INTRAVENOUS | Status: DC | PRN
  Administered 2022-09-03: 10 mL via INTRAVENOUS

## 2022-09-03 MED ORDER — SODIUM CHLORIDE 0.9 % IV SOLN: INTRAVENOUS | Status: DC

## 2022-09-03 MED ORDER — SODIUM CHLORIDE 0.9 % IV SOLN
125.0000 mg | Freq: Once | INTRAVENOUS | Status: AC
  Administered 2022-09-03: 125 mg via INTRAVENOUS
  Filled 2022-09-03: qty 125

## 2022-09-03 NOTE — Patient Instructions (Signed)

## 2022-09-16 ENCOUNTER — Inpatient Hospital Stay: Payer: 59

## 2022-09-16 ENCOUNTER — Other Ambulatory Visit: Payer: Self-pay | Admitting: Physician Assistant

## 2022-09-16 ENCOUNTER — Inpatient Hospital Stay: Payer: 59 | Admitting: Hematology & Oncology

## 2022-09-16 ENCOUNTER — Other Ambulatory Visit: Payer: Self-pay | Admitting: Hematology & Oncology

## 2022-09-16 DIAGNOSIS — I5022 Chronic systolic (congestive) heart failure: Secondary | ICD-10-CM

## 2022-09-16 NOTE — Telephone Encounter (Signed)
Previous patient of Dr. Tamala Julian; now seeing Nicholes Rough, PA. Please review for refill. Thank you!

## 2022-09-17 NOTE — Progress Notes (Unsigned)
Office Visit    Patient Name: [name] Date of Encounter: 09/18/2022  PCP:  Volanda Napoleon, MD   [place]  Cardiologist:  Freada Bergeron, MD  Advanced Practice Provider:  No care team member to display Electrophysiologist:  None   HPI    [name] is a 62 y.o. female with a hx of recurrent ovarian cancer (multiple intra-abdominal metastases treated with prior surgical resection in 2014 in 2015 with further slow progression), hypothyroidism, CKD stage III, CAD status post inferior MI in [id number] complicated by postinfarction papillary muscle rupture and left ventricular aneurysm who underwent urgent repair with mitral valve replacement utilizing a bioprosthetic tissue valve and repair of the left ventricular aneurysms, CABG, prosthetic mitral valve stenosis status post T MVR (08/2017), ICM, VT, ICD, chronic systolic CHF, atrial fibrillation presents today for 1 year follow-up appointment.  She was seen 2 days prior to last apt by EP.  Overall she was doing okay.  She was anxiously awaiting information back from oncology.  Cardiac wise she had no complaints.  No chest pain, palpitations, shortness of breath, dizzy spells, near syncope/syncope, shocks, or bleeding.  She was seen by me 09/03/21, and she stated that she was hurting all over yesterday and overall had a bad day.  Today she feels much better.  She states that at times her blood pressure gets on the low side but she denies any dizziness or lightheadedness.  She denies palpitations and chest pain.  She states that she got her defibrillator checked on Friday and it states that she was in atrial fibrillation 1 time per year.  She is usually asymptomatic.  We discussed a surveillance echocardiogram for her TMVR and she preferred to do this next March.  She is asymptomatic at this time and denied any lower extremity edema or weight gain due to fluid.  She appeared euvolemic on exam today.  Otherwise,  she is doing well from a CV standpoint.  Today, she tells me she does not have any symptoms.  No chest pains or shortness of breath.  No palpitations.  No episodes of atrial fibrillation that she is aware of.  She gets checked every 6 weeks and her last INR was 2.4.  We have requested a copy of her most recent lab work by her primary care.  She is asking if she can also defibrillator testing today.  BP is low normal but she is asymptomatic and her blood pressure always runs low.  Reports no shortness of breath nor dyspnea on exertion. Reports no chest pain, pressure, or tightness. No edema, orthopnea, PND. Reports no palpitations.    Past Medical History    Past Medical History:  Diagnosis Date   Arthritis    "everywhere"   Asthma    as a child   Automatic implantable cardioverter-defibrillator in situ    DR. Beckie Salts    Bladder cancer [facility]) 2009   "injected medicine to get rid of it"   Chronic systolic CHF (congestive heart failure) ([place])    CKD (chronic kidney disease), stage III ([place])    Coronary artery disease    a. large RV/inferior MI complicated by pseudoaneurysm s/p aneurysemectomy and CABG 10/2009 with papillary muscle rupture requiring bioprosthetic mitral valve replacement in [facility].   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  ICM and VT arrest 10/2009 s/p AICD (revision 01/2012 due to RV lead problem).   Myocardial infarction ([place]) 10/14/2009   Neuropathy    FEET AND HANDS - FROM CHEMO - BUT MUCH IMPROVED   PAF (paroxysmal atrial fibrillation) (HCC)    Pain    JOINT PAINS AND MUSCLE ACHES ALL OVER.   Port-A-Cath in place    RIGHT UPPER CHEST   Prosthetic valve dysfunction    S/P mitral valve replacement with  bioprosthetic valve 10/21/2009   Edwards Perimount stented bovine pericardial tissue valve, size 29 mm   S/P valve-in-valve transcatheter mitral valve replacement 09/16/2017   29 mm Edwards Sapien 3 transcatheter heart valve placed via transapical approach   SVT (supraventricular tachycardia)    Tobacco abuse    Ventricular tachycardia ([place])    Past Surgical History:  Procedure Laterality Date   ABDOMINAL HYSTERECTOMY N/A 07/27/2013   Procedure: TUMOR EXCISION OF ABDOMINAL MASS;  Surgeon: Alvino Chapel, MD;  Location: [facility];  Service: Gynecology;  Laterality: N/A;   APPENDECTOMY  1989   BILATERAL OOPHORECTOMY  2007   CARDIAC DEFIBRILLATOR PLACEMENT  02/06/2012   "lead change"   CARDIAC VALVE REPLACEMENT     CYSTOSCOPY Right 02/23/2013   Procedure: CYSTOSCOPY WITH STENT REMOVAL;  Surgeon: Alvino Chapel, MD;  Location: [facility];  Service: Gynecology;  Laterality: Right;   ICD GENERATOR CHANGEOUT N/A 04/03/2020   Procedure: [place];  Surgeon: Evans Lance, MD;  Location: [place] CV LAB;  Service: Cardiovascular;  Laterality: N/A;   IMPLANTABLE CARDIOVERTER DEFIBRILLATOR REVISION N/A 02/06/2012   Procedure: IMPLANTABLE CARDIOVERTER DEFIBRILLATOR REVISION;  Surgeon: Evans Lance, MD;  Location[facility] C[facility];  Service: Cardiovascular;  Laterality: N/A;   INSERT / REPLACE / REMOVE PACEMAKER  10/2009   DUAL-CHAMBER [place]. JUDE DEFIBRILLATOR   IR IMAGING GUIDED PORT INSERTION  05/11/2018   LAPAROTOMY N/A 01/19/2013   Procedure: TUMOR DEBULKING / BOWEL RESECTION/INSERTION RIGHT UTERERAL DOUBLE J STENT/OMENTECTOMY;  Surgeon: Alvino Chapel, MD;  Location: [facility];  Service: Gynecology;  Laterality: N/A;   MITRAL VALVE REPLACEMENT  10/21/2009   34mm Edwards Perimount bovine pericardial tissue valve - [place]   RIGHT/LEFT HEART CATH AND CORONARY ANGIOGRAPHY N/A 07/02/2017   Procedure: RIGHT/LEFT HEART CATH AND CORONARY ANGIOGRAPHY;  Surgeon: Belva Crome, MD;  Location: [place] CV LAB;  Service: Cardiovascular;  Laterality: N/A;   TEE WITHOUT CARDIOVERSION N/A 09/16/2017   Procedure: TRANSESOPHAGEAL ECHOCARDIOGRAM (TEE);  Surgeon: [name], MD;  Location: [place];  Service: Open Heart Surgery;  Laterality: N/A;   THYMECTOMY  2009   TONSILLECTOMY AND ADENOIDECTOMY  ~ 1967   TOTAL HIP ARTHROPLASTY  05/06/2012   Procedure: TOTAL HIP ARTHROPLASTY;  Surgeon: Gearlean Alf, MD;  Location: [facility];  Service: Orthopedics;  Laterality: Right;   TRANSCATHETER MITRAL VALVE REPLACEMENT, TRANSAPICAL N/A 09/16/2017   Procedure: TRANSCATHETER MITRAL VALVE REPLACEMENT,TRANSAPICAL;  Surgeon: [name], MD;  Location: [place];  Service: Open Heart Surgery;  Laterality: N/A;   TRANSURETHRAL RESECTION OF BLADDER  2009   "for bladder cancer"   [place]    Allergies  Allergies  Allergen Reactions   Augmentin [Amoxicillin-Pot Clavulanate] Other (See Comments)    Muscle Aches   Prochlorperazine Edisylate Other (See Comments)  Nervous/ flutter/ shakes   Adhesive [Tape] Hives    Redness/hives from adhesive tape( tolerates latex gloves); tolerates paper tape     EKGs/Labs/Other Studies Reviewed:   The following studies were reviewed today:  08/14/2020 echocardiogram  IMPRESSIONS    1. 29 mm S3 ViV TMVR. Emax 1.7 m/s, MG 6 mmHG @ 80 bpm, EOA 2.17 cm2, DVI  1.6. Normal prosthesis. No evidence of LVOT obstruction. The mitral valve  has been repaired/replaced. No evidence of mitral valve regurgitation.  There is a 29 mm Sapien 3 ViV TMVR   present in the mitral position. Procedure Date: 09/16/17.   2. Left ventricular ejection fraction, by estimation, is 45 to 50%. The  left ventricle has mildly decreased function. The left ventricle has no  regional wall motion abnormalities. Left ventricular diastolic function  could not be evaluated.   3. Right ventricular systolic function is normal. The right  ventricular  size is normal. Tricuspid regurgitation signal is inadequate for assessing  PA pressure.   4. Left atrial size was mildly dilated.   5. The aortic valve is tricuspid. There is mild calcification of the  aortic valve. There is mild thickening of the aortic valve. Aortic valve  regurgitation is not visualized. Mild aortic valve sclerosis is present,  with no evidence of aortic valve  stenosis.   6. The inferior vena cava is normal in size with greater than 50%  respiratory variability, suggesting right atrial pressure of 3 mmHg.   Comparison(s): No significant change from prior study. EF remains ~45%.  Basal to mid inferior wall is aneurysmal. Apex appears akinetic. TMVR is  stable.   EKG:  EKG is ordered today. It showed NSR in 80 bpm, stable when compared to other EKGs  Recent Labs: 07/17/2022: TSH 3.708 08/27/2022: ALT 10; BUN 12; Creatinine 1.18; Hemoglobin 11.9; Platelet Count 176; Potassium 3.6; Sodium 138  Recent Lipid Panel    Component Value Date/Time   CHOL 247 (H) 09/18/2017 0240   TRIG 144 09/18/2017 0240   HDL 37 (L) 09/18/2017 0240   CHOLHDL 6.7 09/18/2017 0240   VLDL 29 09/18/2017 0240   LDLCALC 181 (H) 09/18/2017 0240     Home Medications   Current Meds  Medication Sig   albuterol (VENTOLIN HFA) 108 (90 Base) MCG/ACT inhaler TAKE 2 PUFFS BY MOUTH EVERY 6 HOURS AS NEEDED FOR WHEEZE OR SHORTNESS OF BREATH   amoxicillin (AMOXIL) 500 MG tablet Take 2,000 mg by mouth See admin instructions. Take 2,000 mg one hour prior to dental visits.   aspirin EC 81 MG tablet Take 81 mg by mouth daily.   b complex vitamins capsule Take 1 capsule by mouth 2 (two) times daily.   carvedilol (COREG) 3.125 MG tablet TAKE ONE TABLET BY MOUTH TWICE DAILY WITH A MEAL   cholecalciferol (VITAMIN D3) 25 MCG (1000 UNIT) tablet Take 4,000 Units by mouth daily.   furosemide (LASIX) 20 MG tablet TAKE ONE TABLET BY MOUTH EVERY DAY   lidocaine-prilocaine (EMLA) cream Apply 1  Application topically as needed. Apply to port per instructions   loperamide (IMODIUM A-D) 2 MG tablet Take 4 mg by mouth 3 (three) times daily as needed for diarrhea or loose stools.   LORazepam (ATIVAN) 1 MG tablet Take 1 tablet (1 mg total) by mouth every 8 (eight) hours as needed for anxiety.   methocarbamol (ROBAXIN) 500 MG tablet TAKE ONE TABLET BY MOUTH EVERY 6 HOURS AS NEEDED FOR MUSCLE SPASMS   Potassium Chloride ER 20 MEQ  TBCR Take 20 mEq by mouth daily.   sodium chloride 0.9 % SOLN 50 mL with pembrolizumab 100 MG/4ML SOLN 200 mg Inject 200 mg into the vein every 21 ( twenty-one) days.   traMADol (ULTRAM) 50 MG tablet Take 1 tablet (50 mg total) by mouth every 6 (six) hours as needed.   Vitamin D, Ergocalciferol, (DRISDOL) 1.25 MG (50000 UNIT) CAPS capsule TAKE ONE CAPSULE BY MOUTH every 7 DAYS   warfarin (COUMADIN) 7.5 MG tablet Take 1 tablet by mouth daily except 1/2 tablet on Monday and Fridays or as directed by Anticoagulation Clinic.   zolpidem (AMBIEN) 10 MG tablet TAKE ONE TABLET BY MOUTH EVERY DAY   [DISCONTINUED] spironolactone (ALDACTONE) 25 MG tablet Take 1 tablet (25 mg total) by mouth daily.     Review of Systems      All other systems reviewed and are otherwise negative except as noted above.  Physical Exam    VS:  BP 102/70   Pulse 80   Ht 5\' 6"  (1.676 m)   Wt 169 lb 9.6 oz (76.9 kg)   SpO2 99%   BMI 27.37 kg/m  , BMI Body mass index is 27.37 kg/m.  Wt Readings from Last 3 Encounters:  09/18/22 169 lb 9.6 oz (76.9 kg)  08/27/22 172 lb 12.8 oz (78.4 kg)  08/07/22 174 lb 1.3 oz (79 kg)     GEN: Well nourished, well developed, in no acute distress. HEENT: normal. Neck: Supple, no JVD, carotid bruits, or masses. Cardiac: RRR, no murmurs, rubs, or gallops. No clubbing, cyanosis, edema.  Radials/PT 2+ and equal bilaterally.  Respiratory:  Respirations regular and unlabored, clear to auscultation bilaterally. GI: Soft, nontender, nondistended. MS: No  deformity or atrophy. Skin: Warm and dry, no rash. Neuro:  Strength and sensation are intact. Psych: Normal affect.  Assessment & Plan    CAD s/p CABG -No chest pain currently -Continue GDMT: Aspirin 81 mg, carvedilol 3.125 mg, furosemide 20 mg, potassium 20 mEq, and spironolactone 25 mg  Status post transcatheter mitral valve replacement -originally replaced surgically with bioprosthetic valve, then TMVR -Asymptomatic -No murmur on exam -echo today and will review when its available  Paroxysmal atrial fibrillation -CHA2DS2-VASc is 3 -Continue Coumadin and routine INR checks -According to her device interrogation she only has atrial fibrillation about once a year  Chronic systolic heart failure -No volume overload evident on physical exam today -No history of lower extremity edema or weight changes -Continue current diuretic regimen, potassium refilled (mild hypokalemia on recent labs)  ICD -will see if she can be interrogated today while she is here in the office -She will still need to come in for an annual apt  NSVT -None noted by EP -She has not had any palpitations  7. Hyperlipidemia -monitored by her PCP who is also her oncologist -Goal LDL < 70 due to history of CAD    Disposition: Follow up  1 year  with Freada Bergeron, MD or APP.  Signed, Elgie Collard, PA-C 09/18/2022, 11:17 AM [facility]

## 2022-09-18 ENCOUNTER — Encounter: Payer: Self-pay | Admitting: Family

## 2022-09-18 ENCOUNTER — Inpatient Hospital Stay: Payer: 59

## 2022-09-18 ENCOUNTER — Inpatient Hospital Stay (HOSPITAL_BASED_OUTPATIENT_CLINIC_OR_DEPARTMENT_OTHER): Payer: 59 | Admitting: Family

## 2022-09-18 ENCOUNTER — Encounter: Payer: Self-pay | Admitting: Physician Assistant

## 2022-09-18 ENCOUNTER — Ambulatory Visit (HOSPITAL_BASED_OUTPATIENT_CLINIC_OR_DEPARTMENT_OTHER)
Admission: RE | Admit: 2022-09-18 | Discharge: 2022-09-18 | Disposition: A | Discharge status: Home / Self Care | Payer: 59 | Source: Ambulatory Visit | Attending: Physician Assistant | Admitting: Physician Assistant

## 2022-09-18 ENCOUNTER — Ambulatory Visit (INDEPENDENT_AMBULATORY_CARE_PROVIDER_SITE_OTHER): Payer: 59 | Admitting: Physician Assistant

## 2022-09-18 VITALS — BP 107/64 | HR 70 | Temp 97.8°F | Resp 18 | Wt 169.1 lb

## 2022-09-18 VITALS — BP 102/70 | HR 80 | Ht 66.0 in | Wt 169.6 lb

## 2022-09-18 DIAGNOSIS — I255 Ischemic cardiomyopathy: Secondary | ICD-10-CM | POA: Insufficient documentation

## 2022-09-18 DIAGNOSIS — D500 Iron deficiency anemia secondary to blood loss (chronic): Secondary | ICD-10-CM | POA: Diagnosis not present

## 2022-09-18 DIAGNOSIS — D3910 Neoplasm of uncertain behavior of unspecified ovary: Secondary | ICD-10-CM

## 2022-09-18 DIAGNOSIS — I4729 Other ventricular tachycardia: Secondary | ICD-10-CM | POA: Diagnosis present

## 2022-09-18 DIAGNOSIS — I480 Paroxysmal atrial fibrillation: Secondary | ICD-10-CM | POA: Insufficient documentation

## 2022-09-18 DIAGNOSIS — Z4502 Encounter for adjustment and management of automatic implantable cardiac defibrillator: Secondary | ICD-10-CM | POA: Diagnosis present

## 2022-09-18 DIAGNOSIS — Z952 Presence of prosthetic heart valve: Secondary | ICD-10-CM | POA: Diagnosis present

## 2022-09-18 DIAGNOSIS — E785 Hyperlipidemia, unspecified: Secondary | ICD-10-CM

## 2022-09-18 DIAGNOSIS — I2510 Atherosclerotic heart disease of native coronary artery without angina pectoris: Secondary | ICD-10-CM

## 2022-09-18 DIAGNOSIS — I5022 Chronic systolic (congestive) heart failure: Secondary | ICD-10-CM

## 2022-09-18 DIAGNOSIS — Z5112 Encounter for antineoplastic immunotherapy: Secondary | ICD-10-CM | POA: Diagnosis not present

## 2022-09-18 LAB — CBC WITH DIFFERENTIAL (CANCER CENTER ONLY)
Abs Immature Granulocytes: 0.02 10*3/uL (ref 0.00–0.07)
Basophils Absolute: 0 10*3/uL (ref 0.0–0.1)
Basophils Relative: 1 %
Eosinophils Absolute: 0.1 10*3/uL (ref 0.0–0.5)
Eosinophils Relative: 2 %
HCT: 37.6 % (ref 36.0–46.0)
Hemoglobin: 12.6 g/dL (ref 12.0–15.0)
Immature Granulocytes: 0 %
Lymphocytes Relative: 9 %
Lymphs Abs: 0.6 10*3/uL — ABNORMAL LOW (ref 0.7–4.0)
MCH: 30.3 pg (ref 26.0–34.0)
MCHC: 33.5 g/dL (ref 30.0–36.0)
MCV: 90.4 fL (ref 80.0–100.0)
Monocytes Absolute: 0.5 10*3/uL (ref 0.1–1.0)
Monocytes Relative: 8 %
Neutro Abs: 5.4 10*3/uL (ref 1.7–7.7)
Neutrophils Relative %: 80 %
Platelet Count: 177 10*3/uL (ref 150–400)
RBC: 4.16 MIL/uL (ref 3.87–5.11)
RDW: 13.1 % (ref 11.5–15.5)
WBC Count: 6.7 10*3/uL (ref 4.0–10.5)
nRBC: 0 % (ref 0.0–0.2)

## 2022-09-18 LAB — LACTATE DEHYDROGENASE: LDH: 195 U/L — ABNORMAL HIGH (ref 98–192)

## 2022-09-18 LAB — IRON AND IRON BINDING CAPACITY (CC-WL,HP ONLY)
Iron: 56 ug/dL (ref 28–170)
Saturation Ratios: 16 % (ref 10.4–31.8)
TIBC: 351 ug/dL (ref 250–450)
UIBC: 295 ug/dL (ref 148–442)

## 2022-09-18 LAB — CMP (CANCER CENTER ONLY)
ALT: 10 U/L (ref 0–44)
AST: 17 U/L (ref 15–41)
Albumin: 4.1 g/dL (ref 3.5–5.0)
Alkaline Phosphatase: 38 U/L (ref 38–126)
Anion gap: 11 (ref 5–15)
BUN: 13 mg/dL (ref 8–23)
CO2: 27 mmol/L (ref 22–32)
Calcium: 8.8 mg/dL — ABNORMAL LOW (ref 8.9–10.3)
Chloride: 100 mmol/L (ref 98–111)
Creatinine: 1.27 mg/dL — ABNORMAL HIGH (ref 0.44–1.00)
GFR, Estimated: 48 mL/min — ABNORMAL LOW (ref >60)
Glucose, Bld: 90 mg/dL (ref 70–99)
Potassium: 3.1 mmol/L — ABNORMAL LOW (ref 3.5–5.1)
Sodium: 138 mmol/L (ref 135–145)
Total Bilirubin: 0.3 mg/dL (ref 0.3–1.2)
Total Protein: 6.4 g/dL — ABNORMAL LOW (ref 6.5–8.1)

## 2022-09-18 LAB — ECHOCARDIOGRAM COMPLETE
AR max vel: 2.37 cm2
AV Area VTI: 2.53 cm2
AV Area mean vel: 2.64 cm2
AV Mean grad: 6 mmHg
AV Peak grad: 11 mmHg
Ao pk vel: 1.66 m/s
Area-P 1/2: 4.33 cm2
MV VTI: 1.58 cm2
S' Lateral: 4.2 cm

## 2022-09-18 LAB — FERRITIN: Ferritin: 52 ng/mL (ref 11–307)

## 2022-09-18 LAB — TSH: TSH: 77.413 u[IU]/mL — ABNORMAL HIGH (ref 0.350–4.500)

## 2022-09-18 MED ORDER — HEPARIN SOD (PORK) LOCK FLUSH 100 UNIT/ML IV SOLN: 500.0000 [IU] | Freq: Once | INTRAVENOUS | Status: DC | PRN

## 2022-09-18 MED ORDER — SODIUM CHLORIDE 0.9 % IV SOLN
200.0000 mg | Freq: Once | INTRAVENOUS | Status: AC
  Administered 2022-09-18: 200 mg via INTRAVENOUS
  Filled 2022-09-18: qty 8

## 2022-09-18 MED ORDER — SODIUM CHLORIDE 0.9% FLUSH: 10.0000 mL | INTRAVENOUS | Status: DC | PRN

## 2022-09-18 MED ORDER — SPIRONOLACTONE 25 MG PO TABS: 25.0000 mg | ORAL_TABLET | Freq: Every day | ORAL | 3 refills | Status: DC

## 2022-09-18 MED ORDER — SODIUM CHLORIDE 0.9 % IV SOLN: Freq: Once | INTRAVENOUS | Status: AC

## 2022-09-18 NOTE — Progress Notes (Signed)
Hematology and Oncology Follow Up Visit  [name] [id number] [birth year] 62 y.o. 09/18/2022   Principle Diagnosis:  Granulosa Cell tumor of the ovary - recurrent Iron deficiency anemia-malabsorption   Current Therapy:   Pembrolizumab  200 mg IV 3 weeks  --s/p cycle 3 -- start on 07/17/2022  IV iron as indicated   Past Therapy:  Aromasin 25 mg po q day -- start on 08/17/2020 -- d/c 12/2020 Cisplatin/Etoposide s/p cycle#2 (given during hospital admission)   Interim History:  [name] is here today for follow-up and treatment cycle 4. She had a stomach virus with n/v/d last weekend. No symptoms since Monday.  So far she is tolerating treatment with immunotherapy nicely.  Inhibin B was down at her last visit to 158.  No fever, chills, n/v, cough, rash, dizziness, SOB, chest pain, palpitations, abdominal pain or changes in bowel or bladder habits.  No swelling, tenderness, numbness or tingling in her extremities.  No falls or syncope.  Appetite and hydration are good. Weight is stable at 169 lbs.   ECOG Performance Status: 1 - Symptomatic but completely ambulatory  Medications:  Allergies as of 09/18/2022       Reactions   Augmentin [amoxicillin-pot Clavulanate] Other (See Comments)   Muscle Aches   Prochlorperazine Edisylate Other (See Comments)   Nervous/ flutter/ shakes   Adhesive [tape] Hives   Redness/hives from adhesive tape( tolerates latex gloves); tolerates paper tape        Medication List        Accurate as of September 18, 2022 12:18 PM. If you have any questions, ask your nurse or doctor.          albuterol 108 (90 Base) MCG/ACT inhaler Commonly known as: VENTOLIN HFA TAKE 2 PUFFS BY MOUTH EVERY 6 HOURS AS NEEDED FOR WHEEZE OR SHORTNESS OF BREATH   amoxicillin 500 MG tablet Commonly known as: AMOXIL Take 2,000 mg by mouth See admin instructions. Take 2,000 mg one hour prior to dental visits.   aspirin EC 81 MG tablet Take 81 mg by mouth daily.    b complex vitamins capsule Take 1 capsule by mouth 2 (two) times daily.   carvedilol 3.125 MG tablet Commonly known as: COREG TAKE ONE TABLET BY MOUTH TWICE DAILY WITH A MEAL   cholecalciferol 25 MCG (1000 UNIT) tablet Commonly known as: VITAMIN D3 Take 4,000 Units by mouth daily.   furosemide 20 MG tablet Commonly known as: LASIX TAKE ONE TABLET BY MOUTH EVERY DAY   lidocaine-prilocaine cream Commonly known as: EMLA Apply 1 Application topically as needed. Apply to port per instructions   loperamide 2 MG tablet Commonly known as: IMODIUM A-D Take 4 mg by mouth 3 (three) times daily as needed for diarrhea or loose stools.   LORazepam 1 MG tablet Commonly known as: ATIVAN Take 1 tablet (1 mg total) by mouth every 8 (eight) hours as needed for anxiety.   methocarbamol 500 MG tablet Commonly known as: ROBAXIN TAKE ONE TABLET BY MOUTH EVERY 6 HOURS AS NEEDED FOR MUSCLE SPASMS   Potassium Chloride ER 20 MEQ Tbcr Take 20 mEq by mouth daily.   sodium chloride 0.9 % SOLN 50 mL with pembrolizumab 100 MG/4ML SOLN 200 mg Inject 200 mg into the vein every 21 ( twenty-one) days.   spironolactone 25 MG tablet Commonly known as: ALDACTONE Take 1 tablet (25 mg total) by mouth daily.   traMADol 50 MG tablet Commonly known as: ULTRAM Take 1 tablet (50 mg total) by  mouth every 6 (six) hours as needed.   Vitamin D (Ergocalciferol) 1.25 MG (50000 UNIT) Caps capsule Commonly known as: DRISDOL TAKE ONE CAPSULE BY MOUTH every 7 DAYS   warfarin 7.5 MG tablet Commonly known as: COUMADIN Take as directed by the anticoagulation clinic. If you are unsure how to take this medication, talk to your nurse or doctor. Original instructions: Take 1 tablet by mouth daily except 1/2 tablet on Monday and Fridays or as directed by Anticoagulation Clinic.   zolpidem 10 MG tablet Commonly known as: AMBIEN TAKE ONE TABLET BY MOUTH EVERY DAY        Allergies:  Allergies  Allergen Reactions    Augmentin [Amoxicillin-Pot Clavulanate] Other (See Comments)    Muscle Aches   Prochlorperazine Edisylate Other (See Comments)    Nervous/ flutter/ shakes   Adhesive [Tape] Hives    Redness/hives from adhesive tape( tolerates latex gloves); tolerates paper tape    Past Medical History, Surgical history, Social history, and Family History were reviewed and updated.  Review of Systems: All other 10 point review of systems is negative.   Physical Exam:  vitals were not taken for this visit.   Wt Readings from Last 3 Encounters:  09/18/22 169 lb 9.6 oz (76.9 kg)  08/27/22 172 lb 12.8 oz (78.4 kg)  08/07/22 174 lb 1.3 oz (79 kg)    Ocular: Sclerae unicteric, pupils equal, round and reactive to light Ear-nose-throat: Oropharynx clear, dentition fair Lymphatic: No cervical or supraclavicular adenopathy Lungs no rales or rhonchi, good excursion bilaterally Heart regular rate and rhythm, no murmur appreciated Abd soft, nontender, positive bowel sounds, liver or spleen tip palpated, no fluid wave MSK no focal spinal tenderness, no joint edema Neuro: non-focal, well-oriented, appropriate affect Breasts: Deferred  Lab Results  Component Value Date   WBC 8.0 08/27/2022   HGB 11.9 (L) 08/27/2022   HCT 35.7 (L) 08/27/2022   MCV 92.2 08/27/2022   PLT 176 08/27/2022   Lab Results  Component Value Date   FERRITIN 17 08/27/2022   IRON 58 08/27/2022   TIBC 356 08/27/2022   UIBC 298 08/27/2022   IRONPCTSAT 16 08/27/2022   Lab Results  Component Value Date   RETICCTPCT 1.5 08/07/2022   RBC 3.87 08/27/2022   No results found for: "KPAFRELGTCHN", "LAMBDASER", "KAPLAMBRATIO" No results found for: "IGGSERUM", "IGA", "IGMSERUM" No results found for: "TOTALPROTELP", "ALBUMINELP", "A1GS", "A2GS", "BETS", "BETA2SER", "GAMS", "MSPIKE", "SPEI"   Chemistry      Component Value Date/Time   NA 138 08/27/2022 1157   NA 139 08/14/2020 1428   NA 140 03/15/2016 0953   K 3.6 08/27/2022 1157    K 3.9 03/15/2016 0953   CL 100 08/27/2022 1157   CL 102 12/13/2015 0803   CO2 28 08/27/2022 1157   CO2 19 (L) 03/15/2016 0953   BUN 12 08/27/2022 1157   BUN 16 08/14/2020 1428   BUN 15.9 03/15/2016 0953   CREATININE 1.18 (H) 08/27/2022 1157   CREATININE 1.4 (H) 03/15/2016 0953      Component Value Date/Time   CALCIUM 10.0 08/27/2022 1157   CALCIUM 9.3 03/15/2016 0953   ALKPHOS 36 (L) 08/27/2022 1157   ALKPHOS 55 03/15/2016 0953   AST 14 (L) 08/27/2022 1157   AST 17 03/15/2016 0953   ALT 10 08/27/2022 1157   ALT 23 03/15/2016 0953   BILITOT 0.3 08/27/2022 1157   BILITOT 0.38 03/15/2016 0953       Impression and Plan: [name] is a very pleasant 62  yo caucasian female with recurrent granulosa cell tumor, FGFR2 amplification.  So far she continues to tolerate immunotherapy nicely.  Today's Inhibin B is pending.  We will proceed with treatment today per Dr. Marin Olp.  Since patient had a stomach virus we will hold off on replacing her potassium at this time and let her do with potassium rich foods.  We will repeat her CT scans in 3 weeks to assess response.  Follow-up in 4 weeks as Dr. Marin Olp is out of town.    Lottie Dawson, NP 3/27/202412:18 PM

## 2022-09-18 NOTE — Patient Instructions (Signed)

## 2022-09-18 NOTE — Patient Instructions (Signed)
[facility]  Discharge Instructions: Thank you for choosing [facility] to provide your oncology and hematology care.   If you have a lab appointment with the Cancer Center, please go directly to the Cancer Center and check in at the registration area.  Wear comfortable clothing and clothing appropriate for easy access to any Portacath or PICC line.   We strive to give you quality time with your provider. You may need to reschedule your appointment if you arrive late (15 or more minutes).  Arriving late affects you and other patients whose appointments are after yours.  Also, if you miss three or more appointments without notifying the office, you may be dismissed from the clinic at the provider's discretion.      For prescription refill requests, have your pharmacy contact our office and allow 72 hours for refills to be completed.    Today you received the following chemotherapy and/or immunotherapy agents Keytruda      To help prevent nausea and vomiting after your treatment, we encourage you to take your nausea medication as directed.  BELOW ARE SYMPTOMS THAT SHOULD BE REPORTED IMMEDIATELY: *FEVER GREATER THAN 100.4 F (38 C) OR HIGHER *CHILLS OR SWEATING *NAUSEA AND VOMITING THAT IS NOT CONTROLLED WITH YOUR NAUSEA MEDICATION *UNUSUAL SHORTNESS OF BREATH *UNUSUAL BRUISING OR BLEEDING *URINARY PROBLEMS (pain or burning when urinating, or frequent urination) *BOWEL PROBLEMS (unusual diarrhea, constipation, pain near the anus) TENDERNESS IN MOUTH AND THROAT WITH OR WITHOUT PRESENCE OF ULCERS (sore throat, sores in mouth, or a toothache) UNUSUAL RASH, SWELLING OR PAIN  UNUSUAL VAGINAL DISCHARGE OR ITCHING   Items with * indicate a potential emergency and should be followed up as soon as possible or go to the Emergency Department if any problems should occur.  Please show the CHEMOTHERAPY ALERT CARD or IMMUNOTHERAPY ALERT CARD at check-in  to the Emergency Department and triage nurse. Should you have questions after your visit or need to cancel or reschedule your appointment, please contact [facility]  [phone] and follow the prompts.  Office hours are 8:00 a.m. to 4:30 p.m. Monday - Friday. Please note that voicemails left after 4:00 p.m. may not be returned until the following business day.  We are closed weekends and major holidays. You have access to a nurse at all times for urgent questions. Please call the main number to the clinic [phone] and follow the prompts.  For any non-urgent questions, you may also contact your provider using MyChart. We now offer e-Visits for anyone 18 and older to request care online for non-urgent symptoms. For details visit [email or web address].   Also download the MyChart app! Go to the app store, search "MyChart", open the app, select [place] [name], and log in with your MyChart username and password.   

## 2022-09-18 NOTE — Patient Instructions (Signed)
Medication Instructions:  Your physician recommends that you continue on your current medications as directed. Please refer to the Current Medication list given to you today.  *If you need a refill on your cardiac medications before your next appointment, please call your pharmacy*  Lab Work: Have your primary care provider fax [place] a copy of your labs [phone] If you have labs (blood work) drawn today and your tests are completely normal, you will receive your results only by: [facility] (if you have MyChart) OR A paper copy in the mail If you have any lab test that is abnormal or we need to change your treatment, we will call you to review the results.  Follow-Up: At [facility], you and your health needs are our priority.  As part of our continuing mission to provide you with exceptional heart care, we have created designated Provider Care Teams.  These Care Teams include your primary Cardiologist (physician) and Advanced Practice Providers (APPs -  Physician Assistants and Nurse Practitioners) who all work together to provide you with the care you need, when you need it.  Your next appointment:   1 year(s)  Provider:   Dr Johney Frame  Heart-Healthy Eating Plan Many factors influence your heart health, including eating and exercise habits. Heart health is also called coronary health. Coronary risk increases with abnormal blood fat (lipid) levels. A heart-healthy eating plan includes limiting unhealthy fats, increasing healthy fats, limiting salt (sodium) intake, and making other diet and lifestyle changes. What is my plan? Your health care provider may recommend that: You limit your fat intake to _________% or less of your total calories each day. You limit your saturated fat intake to _________% or less of your total calories each day. You limit the amount of cholesterol in your diet to less than _________ mg per day. You limit the amount of sodium in your diet to less  than _________ mg per day. What are tips for following this plan? Cooking Cook foods using methods other than frying. Baking, boiling, grilling, and broiling are all good options. Other ways to reduce fat include: Removing the skin from poultry. Removing all visible fats from meats. Steaming vegetables in water or broth. Meal planning  At meals, imagine dividing your plate into fourths: Fill one-half of your plate with vegetables and green salads. Fill one-fourth of your plate with whole grains. Fill one-fourth of your plate with lean protein foods. Eat 2-4 cups of vegetables per day. One cup of vegetables equals 1 cup (91 g) broccoli or cauliflower florets, 2 medium carrots, 1 large bell pepper, 1 large sweet potato, 1 large tomato, 1 medium white potato, 2 cups (150 g) raw leafy greens. Eat 1-2 cups of fruit per day. One cup of fruit equals 1 small apple, 1 large banana, 1 cup (237 g) mixed fruit, 1 large orange,  cup (82 g) dried fruit, 1 cup (240 mL) 100% fruit juice. Eat more foods that contain soluble fiber. Examples include apples, broccoli, carrots, beans, peas, and barley. Aim to get 25-30 g of fiber per day. Increase your consumption of legumes, nuts, and seeds to 4-5 servings per week. One serving of dried beans or legumes equals  cup (90 g) cooked, 1 serving of nuts is  oz (12 almonds, 24 pistachios, or 7 walnut halves), and 1 serving of seeds equals  oz (8 g). Fats Choose healthy fats more often. Choose monounsaturated and polyunsaturated fats, such as olive and canola oils, avocado oil, flaxseeds, walnuts, almonds,  and seeds. Eat more omega-3 fats. Choose salmon, mackerel, sardines, tuna, flaxseed oil, and ground flaxseeds. Aim to eat fish at least 2 times each week. Check food labels carefully to identify foods with trans fats or high amounts of saturated fat. Limit saturated fats. These are found in animal products, such as meats, butter, and cream. Plant sources of  saturated fats include palm oil, palm kernel oil, and coconut oil. Avoid foods with partially hydrogenated oils in them. These contain trans fats. Examples are stick margarine, some tub margarines, cookies, crackers, and other baked goods. Avoid fried foods. General information Eat more home-cooked food and less restaurant, buffet, and fast food. Limit or avoid alcohol. Limit foods that are high in added sugar and simple starches such as foods made using white refined flour (white breads, pastries, sweets). Lose weight if you are overweight. Losing just 5-10% of your body weight can help your overall health and prevent diseases such as diabetes and heart disease. Monitor your sodium intake, especially if you have high blood pressure. Talk with your health care provider about your sodium intake. Try to incorporate more vegetarian meals weekly. What foods should I eat? Fruits All fresh, canned (in natural juice), or frozen fruits. Vegetables Fresh or frozen vegetables (raw, steamed, roasted, or grilled). Green salads. Grains Most grains. Choose whole wheat and whole grains most of the time. Rice and pasta, including brown rice and pastas made with whole wheat. Meats and other proteins Lean, well-trimmed beef, veal, pork, and lamb. Chicken and [place] without skin. All fish and shellfish. Wild duck, rabbit, pheasant, and venison. Egg whites or low-cholesterol egg substitutes. Dried beans, peas, lentils, and tofu. Seeds and most nuts. Dairy Low-fat or nonfat cheeses, including ricotta and mozzarella. Skim or 1% milk (liquid, powdered, or evaporated). Buttermilk made with low-fat milk. Nonfat or low-fat yogurt. Fats and oils Non-hydrogenated (trans-free) margarines. Vegetable oils, including soybean, sesame, sunflower, olive, avocado, peanut, safflower, corn, canola, and cottonseed. Salad dressings or mayonnaise made with a vegetable oil. Beverages Water (mineral or sparkling). Coffee and tea.  Unsweetened ice tea. Diet beverages. Sweets and desserts Sherbet, gelatin, and fruit ice. Small amounts of dark chocolate. Limit all sweets and desserts. Seasonings and condiments All seasonings and condiments. The items listed above may not be a complete list of foods and beverages you can eat. Contact a dietitian for more options. What foods should I avoid? Fruits Canned fruit in heavy syrup. Fruit in cream or butter sauce. Fried fruit. Limit coconut. Vegetables Vegetables cooked in cheese, cream, or butter sauce. Fried vegetables. Grains Breads made with saturated or trans fats, oils, or whole milk. Croissants. Sweet rolls. Donuts. High-fat crackers, such as cheese crackers and chips. Meats and other proteins Fatty meats, such as hot dogs, ribs, sausage, bacon, rib-eye roast or steak. High-fat deli meats, such as salami and bologna. Caviar. Domestic duck and goose. Organ meats, such as liver. Dairy Cream, sour cream, cream cheese, and creamed cottage cheese. Whole-milk cheeses. Whole or 2% milk (liquid, evaporated, or condensed). Whole buttermilk. Cream sauce or high-fat cheese sauce. Whole-milk yogurt. Fats and oils Meat fat, or shortening. Cocoa butter, hydrogenated oils, palm oil, coconut oil, palm kernel oil. Solid fats and shortenings, including bacon fat, salt pork, lard, and butter. Nondairy cream substitutes. Salad dressings with cheese or sour cream. Beverages Regular sodas and any drinks with added sugar. Sweets and desserts Frosting. Pudding. Cookies. Cakes. Pies. Milk chocolate or white chocolate. Buttered syrups. Full-fat ice cream or ice cream drinks. The items  listed above may not be a complete list of foods and beverages to avoid. Contact a dietitian for more information. Summary Heart-healthy meal planning includes limiting unhealthy fats, increasing healthy fats, limiting salt (sodium) intake and making other diet and lifestyle changes. Lose weight if you are  overweight. Losing just 5-10% of your body weight can help your overall health and prevent diseases such as diabetes and heart disease. Focus on eating a balance of foods, including fruits and vegetables, low-fat or nonfat dairy, lean protein, nuts and legumes, whole grains, and heart-healthy oils and fats. This information is not intended to replace advice given to you by your health care provider. Make sure you discuss any questions you have with your health care provider. Document Revised: 07/16/2021 Document Reviewed: 07/16/2021 Elsevier Patient Education  [place].  Low-Sodium Eating Plan Sodium, which is an element that makes up salt, helps you maintain a healthy balance of fluids in your body. Too much sodium can increase your blood pressure and cause fluid and waste to be held in your body. Your health care provider or dietitian may recommend following this plan if you have high blood pressure (hypertension), kidney disease, liver disease, or heart failure. Eating less sodium can help lower your blood pressure, reduce swelling, and protect your heart, liver, and kidneys. What are tips for following this plan? Reading food labels The Nutrition Facts label lists the amount of sodium in one serving of the food. If you eat more than one serving, you must multiply the listed amount of sodium by the number of servings. Choose foods with less than 140 mg of sodium per serving. Avoid foods with 300 mg of sodium or more per serving. Shopping  Look for lower-sodium products, often labeled as "low-sodium" or "no salt added." Always check the sodium content, even if foods are labeled as "unsalted" or "no salt added." Buy fresh foods. Avoid canned foods and pre-made or frozen meals. Avoid canned, cured, or processed meats. Buy breads that have less than 80 mg of sodium per slice. Cooking  Eat more home-cooked food and less restaurant, buffet, and fast food. Avoid adding salt when  cooking. Use salt-free seasonings or herbs instead of table salt or sea salt. Check with your health care provider or pharmacist before using salt substitutes. Cook with plant-based oils, such as canola, sunflower, or olive oil. Meal planning When eating at a restaurant, ask that your food be prepared with less salt or no salt, if possible. Avoid dishes labeled as brined, pickled, cured, smoked, or made with soy sauce, miso, or teriyaki sauce. Avoid foods that contain MSG (monosodium glutamate). MSG is sometimes added to [place] food, bouillon, and some canned foods. Make meals that can be grilled, baked, poached, roasted, or steamed. These are generally made with less sodium. General information Most people on this plan should limit their sodium intake to 1,500-2,000 mg (milligrams) of sodium each day. What foods should I eat? Fruits Fresh, frozen, or canned fruit. Fruit juice. Vegetables Fresh or frozen vegetables. "No salt added" canned vegetables. "No salt added" tomato sauce and paste. Low-sodium or reduced-sodium tomato and vegetable juice. Grains Low-sodium cereals, including oats, puffed wheat and rice, and shredded wheat. Low-sodium crackers. Unsalted rice. Unsalted pasta. Low-sodium bread. Whole-grain breads and whole-grain pasta. Meats and other proteins Fresh or frozen (no salt added) meat, poultry, seafood, and fish. Low-sodium canned tuna and salmon. Unsalted nuts. Dried peas, beans, and lentils without added salt. Unsalted canned beans. Eggs. Unsalted nut butters. Dairy  Milk. Soy milk. Cheese that is naturally low in sodium, such as ricotta cheese, fresh mozzarella, or Swiss cheese. Low-sodium or reduced-sodium cheese. Cream cheese. Yogurt. Seasonings and condiments Fresh and dried herbs and spices. Salt-free seasonings. Low-sodium mustard and ketchup. Sodium-free salad dressing. Sodium-free light mayonnaise. Fresh or refrigerated horseradish. Lemon juice. Vinegar. Other  foods Homemade, reduced-sodium, or low-sodium soups. Unsalted popcorn and pretzels. Low-salt or salt-free chips. The items listed above may not be a complete list of foods and beverages you can eat. Contact a dietitian for more information. What foods should I avoid? Vegetables Sauerkraut, pickled vegetables, and relishes. Olives. [place] fries. Onion rings. Regular canned vegetables (not low-sodium or reduced-sodium). Regular canned tomato sauce and paste (not low-sodium or reduced-sodium). Regular tomato and vegetable juice (not low-sodium or reduced-sodium). Frozen vegetables in sauces. Grains Instant hot cereals. Bread stuffing, pancake, and biscuit mixes. Croutons. Seasoned rice or pasta mixes. Noodle soup cups. Boxed or frozen macaroni and cheese. Regular salted crackers. Self-rising flour. Meats and other proteins Meat or fish that is salted, canned, smoked, spiced, or pickled. Precooked or cured meat, such as sausages or meat loaves. [place] Salines. Ham. Pepperoni. Hot dogs. Corned beef. Chipped beef. Salt pork. Jerky. Pickled herring. Anchovies and sardines. Regular canned tuna. Salted nuts. Dairy Processed cheese and cheese spreads. Hard cheeses. Cheese curds. Blue cheese. Feta cheese. String cheese. Regular cottage cheese. Buttermilk. Canned milk. Fats and oils Salted butter. Regular margarine. Ghee. Bacon fat. Seasonings and condiments Onion salt, garlic salt, seasoned salt, table salt, and sea salt. Canned and packaged gravies. Worcestershire sauce. Tartar sauce. Barbecue sauce. Teriyaki sauce. Soy sauce, including reduced-sodium. Steak sauce. Fish sauce. Oyster sauce. Cocktail sauce. Horseradish that you find on the shelf. Regular ketchup and mustard. Meat flavorings and tenderizers. Bouillon cubes. Hot sauce. Pre-made or packaged marinades. Pre-made or packaged taco seasonings. Relishes. Regular salad dressings. Salsa. Other foods Salted popcorn and pretzels. Corn chips and puffs. Potato and  tortilla chips. Canned or dried soups. Pizza. Frozen entrees and pot pies. The items listed above may not be a complete list of foods and beverages you should avoid. Contact a dietitian for more information. Summary Eating less sodium can help lower your blood pressure, reduce swelling, and protect your heart, liver, and kidneys. Most people on this plan should limit their sodium intake to 1,500-2,000 mg (milligrams) of sodium each day. Canned, boxed, and frozen foods are high in sodium. Restaurant foods, fast foods, and pizza are also very high in sodium. You also get sodium by adding salt to food. Try to cook at home, eat more fresh fruits and vegetables, and eat less fast food and canned, processed, or prepared foods. This information is not intended to replace advice given to you by your health care provider. Make sure you discuss any questions you have with your health care provider. Document Revised: 05/17/2019 Document Reviewed: 05/12/2019 Elsevier Patient Education  [place].

## 2022-09-19 ENCOUNTER — Other Ambulatory Visit: Payer: Self-pay | Admitting: Hematology & Oncology

## 2022-09-19 ENCOUNTER — Telehealth: Payer: Self-pay

## 2022-09-19 DIAGNOSIS — D3910 Neoplasm of uncertain behavior of unspecified ovary: Secondary | ICD-10-CM

## 2022-09-19 MED ORDER — LEVOTHYROXINE SODIUM 50 MCG PO TABS: 50.0000 ug | ORAL_TABLET | Freq: Every day | ORAL | 3 refills | Status: DC

## 2022-09-19 NOTE — Telephone Encounter (Signed)
-----   Message from Volanda Napoleon, MD sent at 09/19/2022  5:34 AM EDT ----- Call --please let her know that her thyroid is no longer working.  We need to call in Synthroid at 50 mcg p.o. daily.  Thanks.  [name]

## 2022-09-19 NOTE — Progress Notes (Signed)
LM on Vm and mychart-th

## 2022-09-20 LAB — INHIBIN B: Inhibin B: 169.7 pg/mL — ABNORMAL HIGH (ref 0.0–16.9)

## 2022-09-24 ENCOUNTER — Encounter: Payer: Self-pay | Admitting: Hematology & Oncology

## 2022-09-26 ENCOUNTER — Other Ambulatory Visit (HOSPITAL_COMMUNITY): Payer: 59

## 2022-09-27 ENCOUNTER — Ambulatory Visit: Payer: 59 | Attending: Cardiovascular Disease

## 2022-09-27 DIAGNOSIS — Z952 Presence of prosthetic heart valve: Secondary | ICD-10-CM

## 2022-09-27 DIAGNOSIS — Z953 Presence of xenogenic heart valve: Secondary | ICD-10-CM

## 2022-09-27 DIAGNOSIS — I480 Paroxysmal atrial fibrillation: Secondary | ICD-10-CM | POA: Diagnosis not present

## 2022-09-27 LAB — POCT INR: INR: 1.3 — AB (ref 2.0–3.0)

## 2022-09-27 NOTE — Patient Instructions (Signed)
Description   DOES NOT NEED A PRINTOUT. Take 1 tablet today and 1.5 tablets tomorrow and then continue taking warfarin 1 tablet daily except for 1/2 tablet on Monday, Wednesday and Friday.  Remain consistent with leafy veggeis.  Recheck INR in 2 weeks.  [facility] [phone]

## 2022-09-30 ENCOUNTER — Other Ambulatory Visit: Payer: 59

## 2022-09-30 ENCOUNTER — Encounter: Payer: Self-pay | Admitting: Internal Medicine

## 2022-09-30 ENCOUNTER — Ambulatory Visit: Payer: 59 | Admitting: Hematology & Oncology

## 2022-10-01 ENCOUNTER — Ambulatory Visit (INDEPENDENT_AMBULATORY_CARE_PROVIDER_SITE_OTHER): Payer: 59

## 2022-10-01 DIAGNOSIS — I255 Ischemic cardiomyopathy: Secondary | ICD-10-CM | POA: Diagnosis not present

## 2022-10-02 ENCOUNTER — Other Ambulatory Visit: Payer: Self-pay | Admitting: Hematology & Oncology

## 2022-10-02 DIAGNOSIS — D3910 Neoplasm of uncertain behavior of unspecified ovary: Secondary | ICD-10-CM

## 2022-10-02 LAB — CUP PACEART REMOTE DEVICE CHECK
Battery Remaining Longevity: 80 mo
Battery Remaining Percentage: 76 %
Battery Voltage: 2.99 V
Brady Statistic AP VP Percent: 1 %
Brady Statistic AP VS Percent: 5.1 %
Brady Statistic AS VP Percent: 1 %
Brady Statistic AS VS Percent: 92 %
Brady Statistic RA Percent Paced: 1 %
Brady Statistic RV Percent Paced: 1 %
Date Time Interrogation Session: 20240409182034
HighPow Impedance: 69 Ohm
Implantable Lead Connection Status: 753985
Implantable Lead Connection Status: 753985
Implantable Lead Implant Date: 20110503
Implantable Lead Implant Date: 20130815
Implantable Lead Location: 753859
Implantable Lead Location: 753860
Implantable Pulse Generator Implant Date: 20211012
Lead Channel Impedance Value: 260 Ohm
Lead Channel Impedance Value: 400 Ohm
Lead Channel Pacing Threshold Amplitude: 0.5 V
Lead Channel Pacing Threshold Amplitude: 1.875 V
Lead Channel Pacing Threshold Pulse Width: 0.5 ms
Lead Channel Pacing Threshold Pulse Width: 1 ms
Lead Channel Sensing Intrinsic Amplitude: 1.5 mV
Lead Channel Sensing Intrinsic Amplitude: 12 mV
Lead Channel Setting Pacing Amplitude: 2 V
Lead Channel Setting Pacing Amplitude: 2.125
Lead Channel Setting Pacing Pulse Width: 1 ms
Lead Channel Setting Sensing Sensitivity: 0.5 mV
Pulse Gen Serial Number: 111028151
Zone Setting Status: 755011

## 2022-10-08 ENCOUNTER — Other Ambulatory Visit: Payer: Self-pay | Admitting: Hematology & Oncology

## 2022-10-08 DIAGNOSIS — F419 Anxiety disorder, unspecified: Secondary | ICD-10-CM

## 2022-10-10 ENCOUNTER — Inpatient Hospital Stay: Payer: 59 | Attending: Hematology & Oncology

## 2022-10-10 ENCOUNTER — Ambulatory Visit (HOSPITAL_BASED_OUTPATIENT_CLINIC_OR_DEPARTMENT_OTHER)
Admission: RE | Admit: 2022-10-10 | Discharge: 2022-10-10 | Disposition: A | Discharge status: Home / Self Care | Payer: 59 | Source: Ambulatory Visit | Attending: Family | Admitting: Family

## 2022-10-10 VITALS — BP 113/75 | HR 69 | Temp 98.1°F | Resp 18

## 2022-10-10 DIAGNOSIS — C569 Malignant neoplasm of unspecified ovary: Secondary | ICD-10-CM | POA: Insufficient documentation

## 2022-10-10 DIAGNOSIS — K909 Intestinal malabsorption, unspecified: Secondary | ICD-10-CM | POA: Insufficient documentation

## 2022-10-10 DIAGNOSIS — Z79890 Hormone replacement therapy (postmenopausal): Secondary | ICD-10-CM | POA: Insufficient documentation

## 2022-10-10 DIAGNOSIS — Z5112 Encounter for antineoplastic immunotherapy: Secondary | ICD-10-CM | POA: Insufficient documentation

## 2022-10-10 DIAGNOSIS — D500 Iron deficiency anemia secondary to blood loss (chronic): Secondary | ICD-10-CM

## 2022-10-10 DIAGNOSIS — D3910 Neoplasm of uncertain behavior of unspecified ovary: Secondary | ICD-10-CM

## 2022-10-10 DIAGNOSIS — Z79620 Long term (current) use of immunosuppressive biologic: Secondary | ICD-10-CM | POA: Insufficient documentation

## 2022-10-10 DIAGNOSIS — Z7901 Long term (current) use of anticoagulants: Secondary | ICD-10-CM | POA: Insufficient documentation

## 2022-10-10 DIAGNOSIS — E039 Hypothyroidism, unspecified: Secondary | ICD-10-CM | POA: Insufficient documentation

## 2022-10-10 DIAGNOSIS — D508 Other iron deficiency anemias: Secondary | ICD-10-CM | POA: Insufficient documentation

## 2022-10-10 MED ORDER — SODIUM CHLORIDE 0.9% FLUSH
10.0000 mL | Freq: Once | INTRAVENOUS | Status: AC
  Administered 2022-10-10: 10 mL

## 2022-10-10 MED ORDER — IOHEXOL 300 MG/ML  SOLN
100.0000 mL | Freq: Once | INTRAMUSCULAR | Status: AC | PRN
  Administered 2022-10-10: 100 mL via INTRAVENOUS

## 2022-10-10 MED ORDER — HEPARIN SOD (PORK) LOCK FLUSH 100 UNIT/ML IV SOLN
500.0000 [IU] | Freq: Once | INTRAVENOUS | Status: AC
  Administered 2022-10-10: 500 [IU] via INTRAVENOUS

## 2022-10-10 NOTE — Patient Instructions (Signed)

## 2022-10-11 ENCOUNTER — Ambulatory Visit: Payer: 59 | Attending: Cardiology | Admitting: *Deleted

## 2022-10-11 DIAGNOSIS — I480 Paroxysmal atrial fibrillation: Secondary | ICD-10-CM | POA: Diagnosis not present

## 2022-10-11 DIAGNOSIS — Z952 Presence of prosthetic heart valve: Secondary | ICD-10-CM | POA: Diagnosis not present

## 2022-10-11 DIAGNOSIS — Z953 Presence of xenogenic heart valve: Secondary | ICD-10-CM | POA: Diagnosis not present

## 2022-10-11 LAB — POCT INR: POC INR: 1.8

## 2022-10-11 NOTE — Patient Instructions (Addendum)
Description   Take 1 tablet of warfarin today Then START taking warfarin 1 tablet daily except for 1/2 a tablet on Mondays and Fridays. Recheck INR in 2 weeks.  [facility] [phone]

## 2022-10-15 NOTE — Progress Notes (Unsigned)
Cardiology Office Note Date:  10/15/2022  Patient ID:  [name], [name] 04-22-1961, MRN [id number] PCP:  Josph Macho, MD  Cardiologist:  Dr. Katrinka Blazing >> Dr. Shari Prows Structural: Dr. Excell Seltzer Electrophysiologist: Dr. Ladona Ridgel    Chief Complaint:  annual visit  History of Present Illness: [name] is a 62 y.o. female with history of recurrent ovarian Ca (multiple intra-abdominal metastases treated with prior surgical resection in 2014 and 2015 with further slow progression), hypothyroidism, CKD (III), CAD, s/p inferior MI in 2011 complicated by postinfarction papillary muscle rupture and left ventricular aneurysm who underwent urgent repair with mitral valve replacement using a bioprosthetic tissue valve and repair of the left ventricular aneurysm, CABG),  prosthetic mitral valve stenosis s/p TMVR (08/2017), ICM, VT, ICD, chronic CHF (systolic), AFib   At the time of gen change  Dr. Ladona Ridgel, discussed known A lead malfunction with low pacing impedence but p waves and the atrial threshold are still good, since she does not pace much, he recommended not removing or addind a new atrial lead.  She comes in today to be seen for Dr. Ladona Ridgel, last seen by him at the time of her gen change Oct 2021.  I saw her 08/31/21 All in all doing OK Anxiously awaiting nes back from oncology on how her numbers looked yesterday. Cardiac-wise, no complaints No CP, palpitations or cardiac awareness. No SOB No dizzy spells, near syncope or syncope. No shocks No bleeding Sees the coumadin clinic today No changes were made, pending visit w/cardiology team  She saw T. Asa Lente, PA-C 09/18/22, BP was low without symptoms, doing OK, planned for an echo, no changes were made.  Planned to transition to Dr. Jacelyn Pi She is doing well, though thyroid function has become a problem, her oncologist is managing, on Synthroid. Feels cold all of the time, tired but improving.  No cardiac concerns,  symptoms No CP, palpitations, or cardiac awareness No SOB, DOE No near syncope or syncope. No bleeding or signs of bleeding   Device information SJM dual chamber ICD, implanted RA lead 2011, RV  Lead 2013, gen change 04/03/20 A lead with known low impedance  Past Medical History:  Diagnosis Date   Arthritis    "everywhere"   Asthma    as a child   Automatic implantable cardioverter-defibrillator in situ    DR. Rosette Reveal    Bladder cancer 2009   "injected medicine to get rid of it"   Chronic systolic CHF (congestive heart failure)    CKD (chronic kidney disease), stage III    Coronary artery disease    a. large RV/inferior MI complicated by pseudoaneurysm s/p aneurysemectomy and CABG 10/2009 with papillary muscle rupture requiring bioprosthetic mitral valve replacement in [facility].   Granulosa cell carcinoma of ovary    recurrent - surgical resection 2007, 2014 and 2016    H/O thymectomy    High cholesterol    Hypothyroidism    ICD (implantable cardiac defibrillator) in place    Iron deficiency anemia due to chronic blood loss 08/16/2020   Ischemic cardiomyopathy    a.  ICM and VT arrest 10/2009 s/p AICD (revision 01/2012 due to RV lead problem).   Myocardial infarction 10/14/2009   Neuropathy    FEET AND HANDS - FROM CHEMO - BUT MUCH IMPROVED   PAF (paroxysmal atrial fibrillation)    Pain    JOINT PAINS AND MUSCLE ACHES ALL OVER.   Port-A-Cath in place    RIGHT UPPER CHEST  Prosthetic valve dysfunction    S/P mitral valve replacement with bioprosthetic valve 10/21/2009   Edwards Perimount stented bovine pericardial tissue valve, size 29 mm   S/P valve-in-valve transcatheter mitral valve replacement 09/16/2017   29 mm Edwards Sapien 3 transcatheter heart valve placed via transapical approach   SVT (supraventricular tachycardia)    Tobacco abuse    Ventricular tachycardia     Past Surgical History:  Procedure Laterality Date   ABDOMINAL HYSTERECTOMY N/A 07/27/2013    Procedure: TUMOR EXCISION OF ABDOMINAL MASS;  Surgeon: Jeannette Corpus, MD;  Location: [facility] ORS;  Service: Gynecology;  Laterality: N/A;   APPENDECTOMY  1989   BILATERAL OOPHORECTOMY  2007   CARDIAC DEFIBRILLATOR PLACEMENT  02/06/2012   "lead change"   CARDIAC VALVE REPLACEMENT     CYSTOSCOPY Right 02/23/2013   Procedure: CYSTOSCOPY WITH STENT REMOVAL;  Surgeon: Jeannette Corpus, MD;  Location: [facility];  Service: Gynecology;  Laterality: Right;   ICD GENERATOR CHANGEOUT N/A 04/03/2020   Procedure: ICD GENERATOR CHANGEOUT;  Surgeon: Marinus Maw, MD;  Location: [facility] INVASIVE CV LAB;  Service: Cardiovascular;  Laterality: N/A;   IMPLANTABLE CARDIOVERTER DEFIBRILLATOR REVISION N/A 02/06/2012   Procedure: IMPLANTABLE CARDIOVERTER DEFIBRILLATOR REVISION;  Surgeon: Marinus Maw, MD;  Location: [facility] CATH LAB;  Service: Cardiovascular;  Laterality: N/A;   INSERT / REPLACE / REMOVE PACEMAKER  10/2009   DUAL-CHAMBER St. JUDE DEFIBRILLATOR   IR IMAGING GUIDED PORT INSERTION  05/11/2018   LAPAROTOMY N/A 01/19/2013   Procedure: TUMOR DEBULKING / BOWEL RESECTION/INSERTION RIGHT UTERERAL DOUBLE J STENT/OMENTECTOMY;  Surgeon: Jeannette Corpus, MD;  Location: [facility];  Service: Gynecology;  Laterality: N/A;   MITRAL VALVE REPLACEMENT  10/21/2009   29mm Edwards Perimount bovine pericardial tissue valve - [place]   RIGHT/LEFT HEART CATH AND CORONARY ANGIOGRAPHY N/A 07/02/2017   Procedure: RIGHT/LEFT HEART CATH AND CORONARY ANGIOGRAPHY;  Surgeon: Lyn Records, MD;  Location: [facility] INVASIVE CV LAB;  Service: Cardiovascular;  Laterality: N/A;   TEE WITHOUT CARDIOVERSION N/A 09/16/2017   Procedure: TRANSESOPHAGEAL ECHOCARDIOGRAM (TEE);  Surgeon: Tonny Bollman, MD;  Location: [facility] OR;  Service: Open Heart Surgery;  Laterality: N/A;   THYMECTOMY  2009   TONSILLECTOMY AND ADENOIDECTOMY  ~ 1967   TOTAL HIP ARTHROPLASTY  05/06/2012   Procedure: TOTAL HIP ARTHROPLASTY;  Surgeon: Loanne Drilling, MD;   Location: [facility];  Service: Orthopedics;  Laterality: Right;   TRANSCATHETER MITRAL VALVE REPLACEMENT, TRANSAPICAL N/A 09/16/2017   Procedure: TRANSCATHETER MITRAL VALVE REPLACEMENT,TRANSAPICAL;  Surgeon: Tonny Bollman, MD;  Location: [facility] OR;  Service: Open Heart Surgery;  Laterality: N/A;   TRANSURETHRAL RESECTION OF BLADDER  2009   "for bladder cancer"   TUBAL LIGATION  1993   VAGINAL HYSTERECTOMY  2001    Current Outpatient Medications  Medication Sig Dispense Refill   albuterol (VENTOLIN HFA) 108 (90 Base) MCG/ACT inhaler TAKE 2 PUFFS BY MOUTH EVERY 6 HOURS AS NEEDED FOR WHEEZE OR SHORTNESS OF BREATH (Patient not taking: Reported on 10/10/2022) 6.7 each 1   amoxicillin (AMOXIL) 500 MG tablet Take 2,000 mg by mouth See admin instructions. Take 2,000 mg one hour prior to dental visits. (Patient not taking: Reported on 10/10/2022)     aspirin EC 81 MG tablet Take 81 mg by mouth daily.     b complex vitamins capsule Take 1 capsule by mouth 2 (two) times daily.     carvedilol (COREG) 3.125 MG tablet TAKE ONE TABLET BY MOUTH TWICE DAILY WITH A MEAL  180 tablet 0   cholecalciferol (VITAMIN D3) 25 MCG (1000 UNIT) tablet Take 4,000 Units by mouth daily.     furosemide (LASIX) 20 MG tablet TAKE ONE TABLET BY MOUTH EVERY DAY 90 tablet 3   levothyroxine (SYNTHROID) 50 MCG tablet Take 1 tablet (50 mcg total) by mouth daily before breakfast. 30 tablet 3   lidocaine-prilocaine (EMLA) cream Apply 1 Application topically as needed. Apply to port per instructions 30 g 12   loperamide (IMODIUM A-D) 2 MG tablet Take 4 mg by mouth 3 (three) times daily as needed for diarrhea or loose stools.     LORazepam (ATIVAN) 1 MG tablet Take 1 tablet (1 mg total) by mouth every 8 (eight) hours as needed for anxiety. 90 tablet 0   methocarbamol (ROBAXIN) 500 MG tablet TAKE ONE TABLET BY MOUTH EVERY 6 HOURS AS NEEDED FOR MUSCLE SPASMS 60 tablet 2   Potassium Chloride ER 20 MEQ TBCR Take 20 mEq by mouth daily. 90 tablet 3    sodium chloride 0.9 % SOLN 50 mL with pembrolizumab 100 MG/4ML SOLN 200 mg Inject 200 mg into the vein every 21 ( twenty-one) days.     spironolactone (ALDACTONE) 25 MG tablet Take 1 tablet (25 mg total) by mouth daily. 90 tablet 3   traMADol (ULTRAM) 50 MG tablet TAKE ONE TABLET BY MOUTH EVERY 6 HOURS AS NEEDED 90 tablet 0   Vitamin D, Ergocalciferol, (DRISDOL) 1.25 MG (50000 UNIT) CAPS capsule TAKE ONE CAPSULE BY MOUTH every 7 DAYS 12 capsule 0   warfarin (COUMADIN) 7.5 MG tablet Take 1 tablet by mouth daily except 1/2 tablet on Monday and Fridays or as directed by Anticoagulation Clinic. 90 tablet 1   zolpidem (AMBIEN) 10 MG tablet TAKE ONE TABLET BY MOUTH EVERY DAY 30 tablet 0   No current facility-administered medications for this visit.   Facility-Administered Medications Ordered in Other Visits  Medication Dose Route Frequency Provider Last Rate Last Admin   sodium chloride flush (NS) 0.9 % injection 10 mL  10 mL Intravenous PRN Josph Macho, MD   10 mL at 09/14/18 1150    Allergies:   Augmentin [amoxicillin-pot clavulanate], Prochlorperazine edisylate, and Adhesive [tape]   Social History:  The patient  reports that she quit smoking about 9 months ago. Her smoking use included cigarettes. She started smoking about 48 years ago. She has a 15.00 pack-year smoking history. She has never used smokeless tobacco. She reports that she does not drink alcohol and does not use drugs.   Family History:  The patient's family history includes Heart attack in her brother, maternal grandfather, and paternal grandfather; Ovarian cancer in her mother; Stroke in her maternal grandmother.  ROS:  Please see the history of present illness.    All other systems are reviewed and otherwise negative.   PHYSICAL EXAM:  VS:  There were no vitals taken for this visit. BMI: There is no height or weight on file to calculate BMI. Well nourished, well developed, in no acute distress HEENT: normocephalic,  atraumatic Neck: no JVD, carotid bruits or masses Cardiac:  RRR; no significant murmurs, no rubs, or gallops Lungs:  CTA b/l, no wheezing, rhonchi or rales Abd: soft, nontender MS: no deformity or atrophy Ext: no edema Skin: warm and dry, no rash Neuro:  No gross deficits appreciated Psych: euthymic mood, full affect  ICD site: no skin changes, tethering or discomfort   EKG:  not done today Device interrogation done today and reviewed by myself:  Battery and lead measurements are good P waves measure slightly smaller, sensitivity adjusted  No arrhythmias  08/27/2018: TTE IMPRESSIONS   1. The left ventricle has mild-moderately reduced systolic function, with  an ejection fraction of 40-45%. The cavity size was mildly dilated. Left  ventricular diastolic Doppler parameters are indeterminate.   2. Septal hypokinesis inferior basal aneurysm.   3. The right ventricle has normal systolic function. The cavity was  normal. There is no increase in right ventricular wall thickness.   4. Pacing wires in RA/RV.   5. Left atrial size was moderately dilated.   6. Post bioprosthetic MVR and subsequent valve in vlave replacement No  significant MR and stable mean gradient 6 mmHg.   7. The tricuspid valve is normal in structure.   8. The aortic valve is tricuspid Moderate thickening of the aortic valve  Moderate calcification of the aortic valve.   9. The pulmonic valve was grossly normal. Pulmonic valve regurgitation is  mild by color flow Doppler.  10. The aortic root is normal in size     07/02/2017: [facility] Severe mitral prosthesis stenosis with calculated valve area of 1.02 cm square.  This is based upon a mean gradient across the mitral valve of 18.8 mmHg.  No mitral regurgitation is noted by left ventriculography. Mild pulmonary hypertension with mean pressure 30 mmHg (42/19 mmHg). Inferobasal dyskinesis with akinesis of the mid and apical inferior wall.  EF 30-35%.  Normal anterior wall  motion.  Normal LVEDP. Widely patent left main, LAD, and circumflex coronary artery.  The second obtuse marginal contains eccentric proximal 50% narrowing. Dominant right coronary with mid vessel 80% segmental stenosis with appearance of dissection representing recanalized total occlusion from the culprit event in 2011.  The patient is not having angina and the inferior wall is akinetic.  No indication for PCI in absence of ischemic symptoms.   RECOMMENDATIONS:   Continue medical therapy for LV systolic dysfunction Refer for surgical opinion concerning valve replacement.  Balloon valvotomy in the setting of bioprosthetic valve is not an option due to high risk of valve leaflet tearing and severe mitral regurgitation.      Recent Labs: 09/18/2022: ALT 10; BUN 13; Creatinine 1.27; Hemoglobin 12.6; Platelet Count 177; Potassium 3.1; Sodium 138; TSH 77.413  No results found for requested labs within last 365 days.   CrCl cannot be calculated (Patient's most recent lab result is older than the maximum 21 days allowed.).   Wt Readings from Last 3 Encounters:  09/18/22 169 lb 1.9 oz (76.7 kg)  09/18/22 169 lb 9.6 oz (76.9 kg)  08/27/22 172 lb 12.8 oz (78.4 kg)     Other studies reviewed: Additional studies/records reviewed today include: summarized above  ASSESSMENT AND PLAN:  1. ICD     stable function     A sensitivity adjusted slightly       2. CAD     On ASA, BB     C/w Dr. Katrinka Blazing > Dr. Shari Prows   3. ICM 4. Chronic CHF     No symptoms or exam findings to suggest volume OL     On BB, diuretic tx     BP limits GDMT  5. Paroxysmal AFib     CHA2DS2Vasc is 3, on warfarin     zero % burden      6. VHD     S/p bioprosthetcic MVR >> stenosis >> TMVR     No symptoms to suggest clinical change     No  significant murmur on exam   7. NSVT     None noted    Disposition: remotes as usual, in clinic again with Dr. Ladona Ridgel in a year, sooner if needed  Current medicines are  reviewed at length with the patient today.  The patient did not have any concerns regarding medicines.  Norma Fredrickson, PA-C 10/15/2022 5:46 PM     [facility] [address] [place] [address] [phone] (office)  [phone] (fax)

## 2022-10-16 ENCOUNTER — Encounter: Payer: Self-pay | Admitting: Physician Assistant

## 2022-10-16 ENCOUNTER — Ambulatory Visit: Payer: 59 | Attending: Physician Assistant | Admitting: Physician Assistant

## 2022-10-16 VITALS — BP 100/64 | HR 74 | Ht 66.0 in | Wt 171.0 lb

## 2022-10-16 DIAGNOSIS — I5022 Chronic systolic (congestive) heart failure: Secondary | ICD-10-CM

## 2022-10-16 DIAGNOSIS — I480 Paroxysmal atrial fibrillation: Secondary | ICD-10-CM | POA: Diagnosis not present

## 2022-10-16 DIAGNOSIS — Z952 Presence of prosthetic heart valve: Secondary | ICD-10-CM

## 2022-10-16 DIAGNOSIS — Z95810 Presence of automatic (implantable) cardiac defibrillator: Secondary | ICD-10-CM

## 2022-10-16 DIAGNOSIS — I4729 Other ventricular tachycardia: Secondary | ICD-10-CM | POA: Diagnosis not present

## 2022-10-16 DIAGNOSIS — D6869 Other thrombophilia: Secondary | ICD-10-CM

## 2022-10-16 DIAGNOSIS — I255 Ischemic cardiomyopathy: Secondary | ICD-10-CM

## 2022-10-16 LAB — CUP PACEART INCLINIC DEVICE CHECK
Battery Remaining Longevity: 90 mo
Brady Statistic RA Percent Paced: 0.15 %
Brady Statistic RV Percent Paced: 0.04 %
Date Time Interrogation Session: 20240424150412
HighPow Impedance: 72 Ohm
Implantable Lead Connection Status: 753985
Implantable Lead Connection Status: 753985
Implantable Lead Implant Date: 20110503
Implantable Lead Implant Date: 20130815
Implantable Lead Location: 753859
Implantable Lead Location: 753860
Implantable Pulse Generator Implant Date: 20211012
Lead Channel Impedance Value: 262.5 Ohm
Lead Channel Impedance Value: 400 Ohm
Lead Channel Pacing Threshold Amplitude: 0.5 V
Lead Channel Pacing Threshold Amplitude: 0.5 V
Lead Channel Pacing Threshold Amplitude: 2 V
Lead Channel Pacing Threshold Pulse Width: 0.5 ms
Lead Channel Pacing Threshold Pulse Width: 0.5 ms
Lead Channel Pacing Threshold Pulse Width: 1 ms
Lead Channel Sensing Intrinsic Amplitude: 1.4 mV
Lead Channel Sensing Intrinsic Amplitude: 12 mV
Lead Channel Setting Pacing Amplitude: 2 V
Lead Channel Setting Pacing Amplitude: 2.25 V
Lead Channel Setting Pacing Pulse Width: 1 ms
Lead Channel Setting Sensing Sensitivity: 0.5 mV
Pulse Gen Serial Number: 111028151
Zone Setting Status: 755011

## 2022-10-16 NOTE — Patient Instructions (Signed)
Medication Instructions:    Your physician recommends that you continue on your current medications as directed. Please refer to the Current Medication list given to you today.  *If you need a refill on your cardiac medications before your next appointment, please call your pharmacy*   Lab Work: NONE ORDERED  TODAY    If you have labs (blood work) drawn today and your tests are completely normal, you will receive your results only by: MyChart Message (if you have MyChart) OR A paper copy in the mail If you have any lab test that is abnormal or we need to change your treatment, we will call you to review the results.   Testing/Procedures: NONE ORDERED  TODAY    Follow-Up: At [facility], you and your health needs are our priority.  As part of our continuing mission to provide you with exceptional heart care, we have created designated Provider Care Teams.  These Care Teams include your primary Cardiologist (physician) and Advanced Practice Providers (APPs -  Physician Assistants and Nurse Practitioners) who all work together to provide you with the care you need, when you need it.  We recommend signing up for the patient portal called "MyChart".  Sign up information is provided on this After Visit Summary.  MyChart is used to connect with patients for Virtual Visits (Telemedicine).  Patients are able to view lab/test results, encounter notes, upcoming appointments, etc.  Non-urgent messages can be sent to your provider as well.   To learn more about what you can do with MyChart, go to [email or web address].    Your next appointment:   1 year(s)  Provider:   You may see Dr. Ladona Ridgel  or one of the following Advanced Practice Providers on your designated Care Team:     Other Instructions

## 2022-10-17 ENCOUNTER — Inpatient Hospital Stay (HOSPITAL_BASED_OUTPATIENT_CLINIC_OR_DEPARTMENT_OTHER): Payer: 59 | Admitting: Hematology & Oncology

## 2022-10-17 ENCOUNTER — Other Ambulatory Visit: Payer: Self-pay | Admitting: Hematology & Oncology

## 2022-10-17 ENCOUNTER — Inpatient Hospital Stay: Payer: 59

## 2022-10-17 ENCOUNTER — Encounter: Payer: Self-pay | Admitting: Hematology & Oncology

## 2022-10-17 VITALS — BP 109/70 | HR 74 | Temp 97.7°F | Resp 18 | Wt 170.8 lb

## 2022-10-17 DIAGNOSIS — Z79890 Hormone replacement therapy (postmenopausal): Secondary | ICD-10-CM | POA: Diagnosis not present

## 2022-10-17 DIAGNOSIS — D508 Other iron deficiency anemias: Secondary | ICD-10-CM | POA: Diagnosis present

## 2022-10-17 DIAGNOSIS — D3910 Neoplasm of uncertain behavior of unspecified ovary: Secondary | ICD-10-CM | POA: Diagnosis not present

## 2022-10-17 DIAGNOSIS — K909 Intestinal malabsorption, unspecified: Secondary | ICD-10-CM | POA: Diagnosis not present

## 2022-10-17 DIAGNOSIS — C569 Malignant neoplasm of unspecified ovary: Secondary | ICD-10-CM | POA: Diagnosis present

## 2022-10-17 DIAGNOSIS — E039 Hypothyroidism, unspecified: Secondary | ICD-10-CM | POA: Diagnosis not present

## 2022-10-17 DIAGNOSIS — Z79620 Long term (current) use of immunosuppressive biologic: Secondary | ICD-10-CM | POA: Diagnosis not present

## 2022-10-17 DIAGNOSIS — Z7901 Long term (current) use of anticoagulants: Secondary | ICD-10-CM | POA: Diagnosis not present

## 2022-10-17 DIAGNOSIS — Z5112 Encounter for antineoplastic immunotherapy: Secondary | ICD-10-CM | POA: Diagnosis present

## 2022-10-17 DIAGNOSIS — D500 Iron deficiency anemia secondary to blood loss (chronic): Secondary | ICD-10-CM

## 2022-10-17 LAB — CMP (CANCER CENTER ONLY)
ALT: 11 U/L (ref 0–44)
AST: 15 U/L (ref 15–41)
Albumin: 4 g/dL (ref 3.5–5.0)
Alkaline Phosphatase: 39 U/L (ref 38–126)
Anion gap: 7 (ref 5–15)
BUN: 12 mg/dL (ref 8–23)
CO2: 28 mmol/L (ref 22–32)
Calcium: 9 mg/dL (ref 8.9–10.3)
Chloride: 101 mmol/L (ref 98–111)
Creatinine: 1.25 mg/dL — ABNORMAL HIGH (ref 0.44–1.00)
GFR, Estimated: 48 mL/min — ABNORMAL LOW (ref >60)
Glucose, Bld: 92 mg/dL (ref 70–99)
Potassium: 3.1 mmol/L — ABNORMAL LOW (ref 3.5–5.1)
Sodium: 136 mmol/L (ref 135–145)
Total Bilirubin: 0.3 mg/dL (ref 0.3–1.2)
Total Protein: 6.3 g/dL — ABNORMAL LOW (ref 6.5–8.1)

## 2022-10-17 LAB — CBC WITH DIFFERENTIAL (CANCER CENTER ONLY)
Abs Immature Granulocytes: 0.02 10*3/uL (ref 0.00–0.07)
Basophils Absolute: 0 10*3/uL (ref 0.0–0.1)
Basophils Relative: 1 %
Eosinophils Absolute: 0.1 10*3/uL (ref 0.0–0.5)
Eosinophils Relative: 2 %
HCT: 34.4 % — ABNORMAL LOW (ref 36.0–46.0)
Hemoglobin: 11.8 g/dL — ABNORMAL LOW (ref 12.0–15.0)
Immature Granulocytes: 0 %
Lymphocytes Relative: 9 %
Lymphs Abs: 0.6 10*3/uL — ABNORMAL LOW (ref 0.7–4.0)
MCH: 31.1 pg (ref 26.0–34.0)
MCHC: 34.3 g/dL (ref 30.0–36.0)
MCV: 90.8 fL (ref 80.0–100.0)
Monocytes Absolute: 0.6 10*3/uL (ref 0.1–1.0)
Monocytes Relative: 9 %
Neutro Abs: 5.2 10*3/uL (ref 1.7–7.7)
Neutrophils Relative %: 79 %
Platelet Count: 169 10*3/uL (ref 150–400)
RBC: 3.79 MIL/uL — ABNORMAL LOW (ref 3.87–5.11)
RDW: 13.7 % (ref 11.5–15.5)
WBC Count: 6.5 10*3/uL (ref 4.0–10.5)
nRBC: 0 % (ref 0.0–0.2)

## 2022-10-17 LAB — IRON AND IRON BINDING CAPACITY (CC-WL,HP ONLY)
Iron: 67 ug/dL (ref 28–170)
Saturation Ratios: 19 % (ref 10.4–31.8)
TIBC: 354 ug/dL (ref 250–450)
UIBC: 287 ug/dL (ref 148–442)

## 2022-10-17 LAB — LACTATE DEHYDROGENASE: LDH: 205 U/L — ABNORMAL HIGH (ref 98–192)

## 2022-10-17 MED ORDER — SODIUM CHLORIDE 0.9 % IV SOLN: Freq: Once | INTRAVENOUS | Status: AC

## 2022-10-17 MED ORDER — SODIUM CHLORIDE 0.9 % IV SOLN
200.0000 mg | Freq: Once | INTRAVENOUS | Status: AC
  Administered 2022-10-17: 200 mg via INTRAVENOUS
  Filled 2022-10-17: qty 8

## 2022-10-17 MED ORDER — SODIUM CHLORIDE 0.9% FLUSH
10.0000 mL | INTRAVENOUS | Status: DC | PRN
  Administered 2022-10-17: 10 mL

## 2022-10-17 MED ORDER — HEPARIN SOD (PORK) LOCK FLUSH 100 UNIT/ML IV SOLN
500.0000 [IU] | Freq: Once | INTRAVENOUS | Status: AC | PRN
  Administered 2022-10-17: 500 [IU]

## 2022-10-17 NOTE — Progress Notes (Signed)
Hematology and Oncology Follow Up Visit  [name] [id number] 07-Nov-1960 62 y.o. 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  She did have a CT scan that was done recently.  The CT scan showed that everything looked relatively stable.  However, good was some growth and a tumor in the left rectus abdominis muscle.  It measured 2.1 x 1.2 cm.  Outside of this, everything else looked pretty stable.  Her last Inhibin B level was 170.  We will have to see what this level looks like now.  I thought that since renal had 1 area growth, we could certainly consider radiotherapy to this area of tumor.  She is in good shape.  There is not a lot of other options that we have.  I would not think that this is some that would be a surgical issue since she has disease elsewhere.  I have spoken with Dr. Roselind Messier of Radiation Oncology.  He will see about getting her in and talk to her about the situation.  She is having no abdominal pain.  There is no cough or shortness of breath.  There is no change in bowel or bladder habits.  She is on Coumadin because of the her I think cardiac valve.  She is being managed by Cardiology for this.  She does get IV iron on occasion.  She last got IV iron back in March.  At that time, her ferritin was 17 with an iron saturation of 16%.    She has tolerated the immunotherapy fairly well.  Unfortunately, she developed marked hypothyroidism.  When we checked her last TSH was 77.  We now have her on Synthroid at 50 mcg a day.  She does feel better.  We will  see what her TSH is.  She is still working.  She is quite busy at work.  She is still expecting a grandson.  He will be born I think in July.  Overall, her performance status is ECOG 1.    Medications:  Allergies as of 10/17/2022       Reactions   Augmentin [amoxicillin-pot Clavulanate] Other (See Comments)   Muscle Aches   Prochlorperazine Edisylate Other (See Comments)   Nervous/ flutter/ shakes   Adhesive [tape] Hives   Redness/hives from adhesive tape( tolerates latex gloves); tolerates paper tape        Medication List        Accurate as of October 17, 2022  1:41 PM. If you have any questions, ask your nurse or doctor.          albuterol 108 (90 Base) MCG/ACT inhaler Commonly known as: VENTOLIN HFA TAKE 2 PUFFS BY MOUTH EVERY 6 HOURS AS NEEDED FOR WHEEZE OR SHORTNESS OF BREATH   amoxicillin 500 MG tablet Commonly known as: AMOXIL Take 2,000 mg by mouth See admin instructions. Take 2,000 mg one hour prior to dental visits.   aspirin EC 81 MG tablet Take 81 mg by mouth daily.   b complex vitamins capsule Take 1 capsule by mouth 2 (two) times  daily.   carvedilol 3.125 MG tablet Commonly known as: COREG TAKE ONE TABLET BY MOUTH TWICE DAILY WITH A MEAL   cholecalciferol 25 MCG (1000 UNIT) tablet Commonly known as: VITAMIN D3 Take 4,000 Units by mouth daily.   furosemide 20 MG tablet Commonly known as: LASIX TAKE ONE TABLET BY MOUTH EVERY DAY   levothyroxine 50 MCG tablet Commonly known as: Synthroid Take 1 tablet (50 mcg total) by mouth daily before breakfast.   lidocaine-prilocaine cream Commonly known as: EMLA Apply 1 Application topically as needed. Apply to port per instructions   loperamide 2 MG tablet Commonly known as: IMODIUM A-D Take 4 mg by mouth 3 (three) times daily as needed for diarrhea or loose stools.   LORazepam 1 MG tablet Commonly known as: ATIVAN Take 1 tablet (1 mg total) by mouth every 8 (eight) hours as needed for anxiety.    methocarbamol 500 MG tablet Commonly known as: ROBAXIN TAKE ONE TABLET BY MOUTH EVERY 6 HOURS AS NEEDED FOR MUSCLE SPASMS   Potassium Chloride ER 20 MEQ Tbcr Take 20 mEq by mouth daily.   sodium chloride 0.9 % SOLN 50 mL with pembrolizumab 100 MG/4ML SOLN 200 mg Inject 200 mg into the vein every 21 ( twenty-one) days.   spironolactone 25 MG tablet Commonly known as: ALDACTONE Take 1 tablet (25 mg total) by mouth daily.   traMADol 50 MG tablet Commonly known as: ULTRAM TAKE ONE TABLET BY MOUTH EVERY 6 HOURS AS NEEDED   Vitamin D (Ergocalciferol) 1.25 MG (50000 UNIT) Caps capsule Commonly known as: DRISDOL TAKE ONE CAPSULE BY MOUTH every 7 DAYS   warfarin 7.5 MG tablet Commonly known as: COUMADIN Take as directed by the anticoagulation clinic. If you are unsure how to take this medication, talk to your nurse or doctor. Original instructions: Take 1 tablet by mouth daily except 1/2 tablet on Monday and Fridays or as directed by Anticoagulation Clinic.   zolpidem 10 MG tablet Commonly known as: AMBIEN TAKE ONE TABLET BY MOUTH EVERY DAY        Allergies:  Allergies  Allergen Reactions   Augmentin [Amoxicillin-Pot Clavulanate] Other (See Comments)    Muscle Aches   Prochlorperazine Edisylate Other (See Comments)    Nervous/ flutter/ shakes   Adhesive [Tape] Hives    Redness/hives from adhesive tape( tolerates latex gloves); tolerates paper tape    Past Medical History, Surgical history, Social history, and Family History were reviewed and updated.  Review of Systems:  Review of Systems  Constitutional: Negative.   HENT: Negative.    Eyes: Negative.   Respiratory: Negative.    Cardiovascular: Negative.   Gastrointestinal: Negative.   Genitourinary: Negative.   Musculoskeletal: Negative.   Skin: Negative.   Neurological: Negative.   Endo/Heme/Allergies: Negative.   Psychiatric/Behavioral: Negative.        Exam:  weight is 170 lb 12 oz (77.5 kg). Her oral  temperature is 97.7 F (36.5 C). Her blood pressure is 109/70 and her pulse is 74. Her respiration is 18 and oxygen saturation is 100%.   Wt Readings from Last 3 Encounters:  10/17/22 170 lb 12 oz (77.5 kg)  10/16/22 171 lb (77.6 kg)  09/18/22 169 lb 1.9 oz (76.7 kg)    Physical Exam Vitals reviewed.  HENT:     Head: Normocephalic and atraumatic.  Eyes:     Pupils: Pupils are equal, round, and reactive to light.  Cardiovascular:     Rate and Rhythm: Normal rate and regular rhythm.  Heart sounds: Normal heart sounds.  Pulmonary:     Effort: Pulmonary effort is normal.     Breath sounds: Normal breath sounds.  Abdominal:     General: Bowel sounds are normal.     Palpations: Abdomen is soft.  Musculoskeletal:        General: No tenderness or deformity. Normal range of motion.     Cervical back: Normal range of motion.  Lymphadenopathy:     Cervical: No cervical adenopathy.  Skin:    General: Skin is warm and dry.     Findings: No erythema or rash.  Neurological:     Mental Status: She is alert and oriented to person, place, and time.  Psychiatric:        Behavior: Behavior normal.        Thought Content: Thought content normal.        Judgment: Judgment normal.     Lab Results  Component Value Date   WBC 6.5 10/17/2022   HGB 11.8 (L) 10/17/2022   HCT 34.4 (L) 10/17/2022   MCV 90.8 10/17/2022   PLT 169 10/17/2022   Lab Results  Component Value Date   FERRITIN 52 09/18/2022   IRON 56 09/18/2022   TIBC 351 09/18/2022   UIBC 295 09/18/2022   IRONPCTSAT 16 09/18/2022   Lab Results  Component Value Date   RETICCTPCT 1.5 08/07/2022   RBC 3.79 (L) 10/17/2022   No results found for: "KPAFRELGTCHN", "LAMBDASER", "KAPLAMBRATIO" No results found for: "IGGSERUM", "IGA", "IGMSERUM" No results found for: "TOTALPROTELP", "ALBUMINELP", "A1GS", "A2GS", "BETS", "BETA2SER", "GAMS", "MSPIKE", "SPEI"   Chemistry      Component Value Date/Time   NA 136 10/17/2022 1245    NA 139 08/14/2020 1428   NA 140 03/15/2016 0953   K 3.1 (L) 10/17/2022 1245   K 3.9 03/15/2016 0953   CL 101 10/17/2022 1245   CL 102 12/13/2015 0803   CO2 28 10/17/2022 1245   CO2 19 (L) 03/15/2016 0953   BUN 12 10/17/2022 1245   BUN 16 08/14/2020 1428   BUN 15.9 03/15/2016 0953   CREATININE 1.25 (H) 10/17/2022 1245   CREATININE 1.4 (H) 03/15/2016 0953      Component Value Date/Time   CALCIUM 9.0 10/17/2022 1245   CALCIUM 9.3 03/15/2016 0953   ALKPHOS 39 10/17/2022 1245   ALKPHOS 55 03/15/2016 0953   AST 15 10/17/2022 1245   AST 17 03/15/2016 0953   ALT 11 10/17/2022 1245   ALT 23 03/15/2016 0953   BILITOT 0.3 10/17/2022 1245   BILITOT 0.38 03/15/2016 0953       Impression and Plan: Ms. Swendsen is a very pleasant 62 yo caucasian female with recurrent granulosa cell tumor.  We now have her on immunotherapy.  We are trying to avoid systemic chemotherapy if possible.  Again, she has 1 area of growth with respect to the malignancy.  I will see if she might be a candidate for radiosurgery for this area.  I am glad that the Synthroid is helping her.  We will see what her TSH is.  I will still continue her on the pembrolizumab.  Again, we do have an option with respect to using a targeted drug.  She does have the FGFR2 amplification.  As such, we might be able to consider using erdatifinib.  I would like to try to get her back in another 3 weeks.  Josph Macho, MD 4/25/20241:41 PM

## 2022-10-17 NOTE — Patient Instructions (Signed)

## 2022-10-17 NOTE — Patient Instructions (Signed)
[facility]  Discharge Instructions: Thank you for choosing [facility] to provide your oncology and hematology care.   If you have a lab appointment with the Cancer Center, please go directly to the Cancer Center and check in at the registration area.  Wear comfortable clothing and clothing appropriate for easy access to any Portacath or PICC line.   We strive to give you quality time with your provider. You may need to reschedule your appointment if you arrive late (15 or more minutes).  Arriving late affects you and other patients whose appointments are after yours.  Also, if you miss three or more appointments without notifying the office, you may be dismissed from the clinic at the provider's discretion.      For prescription refill requests, have your pharmacy contact our office and allow 72 hours for refills to be completed.   [place] Today you received the following chemotherapy and/or immunotherapy agents Keytruda       To help prevent nausea and vomiting after your treatment, we encourage you to take your nausea medication as directed.  BELOW ARE SYMPTOMS THAT SHOULD BE REPORTED IMMEDIATELY: *FEVER GREATER THAN 100.4 F (38 C) OR HIGHER *CHILLS OR SWEATING *NAUSEA AND VOMITING THAT IS NOT CONTROLLED WITH YOUR NAUSEA MEDICATION *UNUSUAL SHORTNESS OF BREATH *UNUSUAL BRUISING OR BLEEDING *URINARY PROBLEMS (pain or burning when urinating, or frequent urination) *BOWEL PROBLEMS (unusual diarrhea, constipation, pain near the anus) TENDERNESS IN MOUTH AND THROAT WITH OR WITHOUT PRESENCE OF ULCERS (sore throat, sores in mouth, or a toothache) UNUSUAL RASH, SWELLING OR PAIN  UNUSUAL VAGINAL DISCHARGE OR ITCHING   Items with * indicate a potential emergency and should be followed up as soon as possible or go to the Emergency Department if any problems should occur.  Please show the CHEMOTHERAPY ALERT CARD or IMMUNOTHERAPY ALERT CARD  at check-in to the Emergency Department and triage nurse. Should you have questions after your visit or need to cancel or reschedule your appointment, please contact [facility] [place]  [phone] and follow the prompts.  Office hours are 8:00 a.m. to 4:30 p.m. Monday - Friday. Please note that voicemails left after 4:00 p.m. may not be returned until the following business day.  We are closed weekends and major holidays. You have access to a nurse at all times for urgent questions. Please call the main number to the clinic [phone] and follow the prompts.  For any non-urgent questions, you may also contact your provider using MyChart. We now offer e-Visits for anyone 24 and older to request care online for non-urgent symptoms. For details visit [email or web address].   Also download the MyChart app! Go to the app store, search "MyChart", open the app, select [place], and log in with your MyChart username and password.

## 2022-10-18 ENCOUNTER — Telehealth: Payer: Self-pay | Admitting: *Deleted

## 2022-10-18 LAB — TSH: TSH: 61.787 u[IU]/mL — ABNORMAL HIGH (ref 0.350–4.500)

## 2022-10-18 LAB — INHIBIN B: Inhibin B: 230 pg/mL — ABNORMAL HIGH (ref 0.0–16.9)

## 2022-10-18 LAB — FERRITIN: Ferritin: 32 ng/mL (ref 11–307)

## 2022-10-18 MED ORDER — LEVOTHYROXINE SODIUM 75 MCG PO TABS: 75.0000 ug | ORAL_TABLET | Freq: Every day | ORAL | 3 refills | Status: DC

## 2022-10-18 NOTE — Telephone Encounter (Signed)
-----   Message from Josph Macho, MD sent at 10/18/2022 10:12 AM EDT ----- Call let her know that the thyroid level is still on the high side.  It is lower but still on the higher side.  We need to increase her Synthroid to 75 mcg daily.  Please call this in.  Thanks.  Cindee Lame

## 2022-10-22 ENCOUNTER — Telehealth: Payer: Self-pay | Admitting: Radiation Oncology

## 2022-10-22 NOTE — Telephone Encounter (Signed)
LVM to schedule CON with Dr. Kinard 

## 2022-10-23 NOTE — Progress Notes (Signed)
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 No typical findings of metastatic disease within the chest. 2. Subsolid pulmonary nodules, at least 1 of which is slightly more distinct than on the prior exam. These warrant follow-up attention to exclude low-grade adenocarcinoma. 3. Coronary artery atherosclerosis. Aortic Atherosclerosis (ICD10-I70.0). 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   CT ABDOMEN AND PELVIS IMPRESSION   1. Similar to minimal progression of peritoneal metastasis. The majority of peritoneal implants have minimally enlarged. A right pelvic sidewall lesion is slightly smaller. Suspect a new metastatic implant within the left rectus musculature. 2.  Possible constipation.  CT CHEST ABDOMEN PELVIS W CONTRAST 10/10/2022  IMPRESSION: 1. Interval enlargement of a low attenuation lesion overlying the ventral left rectus musculature measuring 2.1 x 1.2 cm, previously 1.6 x 0.7 cm. This is suspicious for an enlarging soft tissue metastasis. 2. No significant change in peritoneal implants, including in the right paracolic gutter and near the vaginal cuff. 3. No significant change in multiple scattered subcentimeter ground-glass pulmonary nodules, nonspecific. Attention on follow-up. 4. Diffuse bilateral bronchial wall thickening with a background of very fine centrilobular pulmonary nodularity, most commonly seen in smoking-related respiratory bronchiolitis. 5. Coronary artery disease. 6. Status post hysterectomy and appendectomy.  Denice Paradise presented with symptoms of:    Biopsies revealed:  05-01-2018   Past/Anticipated interventions by Gyn/Onc surgery, if any: none at this time  Past/Anticipated interventions by medical oncology, if any:  Josph Macho, MD 10/17/2022 --Impression and  Plan: Ms. Kluk is a very pleasant 62 yo caucasian female with recurrent granulosa cell tumor.  We now have her on immunotherapy.  We are trying to avoid systemic chemotherapy if possible.   Again, she has 1 area of growth with respect to the malignancy.  I will see if she might be a candidate for radiosurgery for this area.   I am glad that the Synthroid is helping her.  We will see what her TSH is.   I will still continue her on the pembrolizumab.   Again, we do have an option with respect to using a targeted drug.  She does have the FGFR2 amplification.  As such, we might be able to consider using erdatifinib.   I would like to try to get her back in another 3 weeks.  Weight changes, if any:  Wt Readings from Last 3 Encounters:  10/31/22 172 lb (78 kg)  10/17/22 170 lb 12 oz (77.5 kg)  10/16/22 171 lb (77.6 kg)   Bowel/Bladder complaints, if any: Denies any changes or concerns  Nausea/Vomiting, if any: Denies  Pain issues, if any:  Reports constant joint and muscle pain; manages with PRN tramadol and methocarbamol   SAFETY ISSUES: Prior radiation? Yes: 05/07/2018 -06/12/2018 Site/dose: Pelvis / 1.8 Gy x 25 fractions for a total dose of 45.0 Gy Pacemaker/ICD? Yes, ICD Possible current pregnancy? No--yesterectomy  Is the patient on methotrexate? No  Current Complaints / other details:  Nothing else of note

## 2022-10-25 ENCOUNTER — Ambulatory Visit: Payer: 59

## 2022-10-28 ENCOUNTER — Ambulatory Visit: Payer: 59 | Attending: Cardiology

## 2022-10-28 DIAGNOSIS — I480 Paroxysmal atrial fibrillation: Secondary | ICD-10-CM | POA: Diagnosis not present

## 2022-10-28 DIAGNOSIS — Z953 Presence of xenogenic heart valve: Secondary | ICD-10-CM | POA: Diagnosis not present

## 2022-10-28 DIAGNOSIS — Z952 Presence of prosthetic heart valve: Secondary | ICD-10-CM | POA: Diagnosis not present

## 2022-10-28 LAB — POCT INR: INR: 2.1 (ref 2.0–3.0)

## 2022-10-28 NOTE — Patient Instructions (Signed)
Description   Take 1 tablet today and then continue taking warfarin 1 tablet daily except for 1/2 a tablet on Mondays and Fridays. Recheck INR in 3 weeks.  Coumadin Clinic [phone]

## 2022-10-30 ENCOUNTER — Telehealth: Payer: Self-pay

## 2022-10-30 NOTE — Telephone Encounter (Signed)
Rn attempted to call pt to obtain meaningful use and nurse evaluation information without success. Voicemail left for pt. Rn will attempt to call back later.

## 2022-10-31 ENCOUNTER — Encounter: Payer: Self-pay | Admitting: Internal Medicine

## 2022-10-31 ENCOUNTER — Other Ambulatory Visit: Payer: Self-pay | Admitting: Hematology & Oncology

## 2022-10-31 ENCOUNTER — Encounter: Payer: Self-pay | Admitting: Radiation Oncology

## 2022-10-31 ENCOUNTER — Other Ambulatory Visit: Payer: Self-pay

## 2022-10-31 ENCOUNTER — Ambulatory Visit
Admission: RE | Admit: 2022-10-31 | Discharge: 2022-10-31 | Disposition: A | Discharge status: Home / Self Care | Payer: 59 | Source: Ambulatory Visit | Attending: Radiation Oncology | Admitting: Radiation Oncology

## 2022-10-31 VITALS — BP 110/66 | HR 78 | Temp 98.5°F | Resp 18 | Wt 172.0 lb

## 2022-10-31 DIAGNOSIS — Z7901 Long term (current) use of anticoagulants: Secondary | ICD-10-CM | POA: Diagnosis not present

## 2022-10-31 DIAGNOSIS — I700 Atherosclerosis of aorta: Secondary | ICD-10-CM | POA: Insufficient documentation

## 2022-10-31 DIAGNOSIS — D3911 Neoplasm of uncertain behavior of right ovary: Secondary | ICD-10-CM

## 2022-10-31 DIAGNOSIS — E039 Hypothyroidism, unspecified: Secondary | ICD-10-CM | POA: Insufficient documentation

## 2022-10-31 DIAGNOSIS — Z90722 Acquired absence of ovaries, bilateral: Secondary | ICD-10-CM | POA: Insufficient documentation

## 2022-10-31 DIAGNOSIS — C786 Secondary malignant neoplasm of retroperitoneum and peritoneum: Secondary | ICD-10-CM | POA: Insufficient documentation

## 2022-10-31 DIAGNOSIS — C561 Malignant neoplasm of right ovary: Secondary | ICD-10-CM | POA: Insufficient documentation

## 2022-10-31 DIAGNOSIS — Z79899 Other long term (current) drug therapy: Secondary | ICD-10-CM | POA: Diagnosis not present

## 2022-10-31 DIAGNOSIS — Z7982 Long term (current) use of aspirin: Secondary | ICD-10-CM | POA: Diagnosis not present

## 2022-10-31 DIAGNOSIS — Z90710 Acquired absence of both cervix and uterus: Secondary | ICD-10-CM | POA: Insufficient documentation

## 2022-10-31 DIAGNOSIS — Z8551 Personal history of malignant neoplasm of bladder: Secondary | ICD-10-CM | POA: Diagnosis not present

## 2022-10-31 DIAGNOSIS — I480 Paroxysmal atrial fibrillation: Secondary | ICD-10-CM | POA: Diagnosis not present

## 2022-10-31 DIAGNOSIS — Z923 Personal history of irradiation: Secondary | ICD-10-CM | POA: Diagnosis not present

## 2022-10-31 DIAGNOSIS — D3910 Neoplasm of uncertain behavior of unspecified ovary: Secondary | ICD-10-CM

## 2022-10-31 DIAGNOSIS — Z79811 Long term (current) use of aromatase inhibitors: Secondary | ICD-10-CM | POA: Insufficient documentation

## 2022-10-31 DIAGNOSIS — Z87891 Personal history of nicotine dependence: Secondary | ICD-10-CM | POA: Insufficient documentation

## 2022-10-31 DIAGNOSIS — I252 Old myocardial infarction: Secondary | ICD-10-CM | POA: Insufficient documentation

## 2022-10-31 DIAGNOSIS — I255 Ischemic cardiomyopathy: Secondary | ICD-10-CM | POA: Diagnosis not present

## 2022-10-31 DIAGNOSIS — I2510 Atherosclerotic heart disease of native coronary artery without angina pectoris: Secondary | ICD-10-CM | POA: Diagnosis not present

## 2022-10-31 DIAGNOSIS — E7800 Pure hypercholesterolemia, unspecified: Secondary | ICD-10-CM | POA: Insufficient documentation

## 2022-10-31 DIAGNOSIS — I5022 Chronic systolic (congestive) heart failure: Secondary | ICD-10-CM | POA: Insufficient documentation

## 2022-10-31 DIAGNOSIS — N183 Chronic kidney disease, stage 3 unspecified: Secondary | ICD-10-CM | POA: Insufficient documentation

## 2022-10-31 DIAGNOSIS — J45909 Unspecified asthma, uncomplicated: Secondary | ICD-10-CM | POA: Insufficient documentation

## 2022-10-31 NOTE — Progress Notes (Signed)
TO BE COMPLETED BY RADIATION ONCOLOGIST OFFICE:   Patient Name: [name]   Date of Birth: [birth year]   Radiation Oncologist: Dr. Antony Blackbird   Site to be Treated: Left rectus musculature   Will x-rays >10 MV be used? No  Will the radiation be >10 cm from the device? Yes  Planned Treatment Start Date:   TO BE COMPLETED BY CARDIOLOGIST OFFICE:   Device Information:  Pacemaker []      ICD [x]    Brand: [employer]: [id number] Model #: Louanna Raw [id number]  Serial Number: [id number]     Date of Placement: Left Chest  Site of Placement: 04/04/2020  Remote Device Check--Frequency: Every 91 days   Last Check: 10/16/2022  Is the Patient Pacer Dependent?:  Unknown  Does cardiologist request Radiation Oncology to schedule device testing by vendor for the following:  Prior to the Initiation of Treatments?  Yes []  No [x]  During Treatments?  Yes []  No [x]  Post Radiation Treatments?  Yes []  No [x]   Is device monitoring necessary by vendor/cardiologist team during treatments?  Yes []   No [x]   Is cardiac monitoring by Radiation Oncology nursing necessary during treatments? Yes []   No [x]   Do you recommend device be relocated prior to Radiation Treatment? Yes []   No [x]   **PLEASE LIST ANY NOTES OR SPECIAL REQUESTS:       CARDIOLOGIST SIGNATURE:  Dr. Lewayne Bunting Per Device Clinic Standing Orders, Lenor Coffin  10/31/2022 4:34 PM  **Please route completed form back to Radiation Oncology Nursing and "P [facility] RAD ONC ADMIN", OR send an update if there will be a delay in having form completed by expected start date.  **Call [phone] if you have any questions or do not get an in-basket response from a Radiation Oncology staff member

## 2022-10-31 NOTE — Progress Notes (Addendum)
Radiation Oncology         [phone] ________________________________  Re-Consultation Note  Name: [name] MRN: [id number]  Date: 10/31/2022  DOB: 1960-11-17  CC:Josph Macho, MD  Josph Macho, MD   REFERRING PHYSICIAN: Josph Macho, MD  DIAGNOSIS: The primary encounter diagnosis was Neoplasm of uncertain behavior of right ovary. A diagnosis of Granulosa cell tumor of ovary, unspecified laterality was also pertinent to this visit.  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 female who is being seen as a courtesy of Dr. Myna Hidalgo for an opinion concerning radiation therapy as part of management for an increasing ventral left rectus musculature lesion from a recurrent ovarian granulosa tumor primary. The patient is known to me for her history of recurrent ovarian cancer s/p radiation therapy in 2019 (detailed below). I last met with the patient on 07/23/2018 for one month follow-up of radiation to the pelvis. She had a restaging scan performed around that time on 07/24/2018 which showed a significant decrease in size of her metastatic disease. An additional restaging scan of the pelvis performed in March of 2020 showed regression of her tumors in the pelvic and right paracolic gutter. No new tumors were noted.   Since that time, the patient has continued to follow with Dr. Myna Hidalgo. Her history pertaining to her disease recurrence is detailed as follows.   Recurrence in January of 2022: Routine labs showed rising tumor markers and she presented for a CT CAP on 06/29/20 which showed an increase in size of a right paracolic gutter metastasis, measuring 2.8 x 2.0 cm, previously 0.7 cm. CT findings otherwise showed stable disease in the bilateral pelvic sidewall metastases, midline deep pelvic metastasis, and now new sites of disease in the  chest, abdomen, or pelvis.   Subsequently, the patient was started on oral chemotherapy with Aromasin in April of 2022. Her inhibin continued to trend up while on Aromasin (noted in June of 2022) prompting a CT AP on 12/15/20 which actually showed a decrease in size of the right pericolic metastasis and stability to a slight decrease in size of the pelvic peritoneal metastases. In light of this, she continued on Aromasin.   However, her inhibin continues to rise and she had a CT CAP on 03/30/21 showed multiple peritoneal implants and an increase in size of the right pericolic gutter metastasis. No signs of metastatic disease elsewhere in the chest, abdomen or pelvis were appreciated.   Subsequently, Aromasin was discontinued in November of 2022. Surprisingly, her inhibin levels decreased after discontinuing treatment and her disease remained relatively stable until September of 2022 where her inhibin showed an increase up to 108.    CT CAP on 04/24/22 showed similar to minimal progression of peritoneal metastasis, as well as mild enlargement in the majority of peritoneal implants. A new metastatic implant within the left rectus musculature was also noted. CT otherwise showed a slight decrease in size of the right pelvic sidewall lesion, and no evidence of metastatic disease in the chest.    Dr. Myna Hidalgo ultimately recommended proceeding with repeat labs and imaging. Labs reviewed during a follow-up visit with Dr. Myna Hidalgo on 07/10/22 showed a significant increase in inhibin to 160. Subsequently, the patient was started on immunotherapy with Pembrolizumab on 07/17/22. She tolerated Pembrolizumab quite well with a notable decrease in inhibin noted.   However, her  most recent CT CAP on 10/10/22 demonstrated interval enlargement of the low attenuation lesion overlying the ventral left rectus musculature measuring 2.1 x 1.2 cm, previously 1.6 x 0.7 cm, suspicious for an enlarging soft tissue metastasis. CT  otherwise showed no significant change in peritoneal implants, including in the right paracolic gutter and near the vaginal cuff, and no significant change in multiple scattered sub-centimeter ground-glass pulmonary nodules (appreciated on prior studies and have remained stable throughout).   Dr. Myna Hidalgo recommends radiation treatment for the increasing lesion overlying the ventral left rectus musculature. She well continue on pembrolizumab in the mean time given that the rest of her disease appears stable.   Of note: Per Dr. Myna Hidalgo, molecular studies were repeated on a prior specimen and her tumor did overexpress EGFR2. As such, treatment with erdatifinib is a consideration for her.   Patient states she has generalized right sided abdominal pain, but denies any other abdominal pain. She denies any nausea, vomiting, or diarrhea. She does experience constant joint and muscle pain that she manages with PRN tramadol and methocarbamol.   PREVIOUS RADIATION THERAPY: Yes   Radiation treatment dates: 05/07/2018-06/12/2018 Site/dose: Pelvis - 1.8 Gy x 25 fractions for a total dose of 45.0 Gy  PAST MEDICAL HISTORY:  Past Medical History:  Diagnosis Date   Arthritis    "everywhere"   Asthma    as a child   Automatic implantable cardioverter-defibrillator in situ    DR. Rosette Reveal    Bladder cancer [facility]) 2009   "injected medicine to get rid of it"   Chronic systolic CHF (congestive heart failure) (HCC)    CKD (chronic kidney disease), stage III (HCC)    Coronary artery disease    a. large RV/inferior MI complicated by pseudoaneurysm s/p aneurysemectomy and CABG 10/2009 with papillary muscle rupture requiring bioprosthetic mitral valve replacement in [facility].   Granulosa cell carcinoma of ovary (HCC)    recurrent - surgical resection 2007, 2014 and 2016    H/O thymectomy    High cholesterol    Hypothyroidism    ICD (implantable cardiac defibrillator) in place    Iron deficiency anemia due  to chronic blood loss 08/16/2020   Ischemic cardiomyopathy    a.  ICM and VT arrest 10/2009 s/p AICD (revision 01/2012 due to RV lead problem).   Myocardial infarction (HCC) 10/14/2009   Neuropathy    FEET AND HANDS - FROM CHEMO - BUT MUCH IMPROVED   PAF (paroxysmal atrial fibrillation) (HCC)    Pain    JOINT PAINS AND MUSCLE ACHES ALL OVER.   Port-A-Cath in place    RIGHT UPPER CHEST   Prosthetic valve dysfunction    S/P mitral valve replacement with bioprosthetic valve 10/21/2009   Edwards Perimount stented bovine pericardial tissue valve, size 29 mm   S/P valve-in-valve transcatheter mitral valve replacement 09/16/2017   29 mm Edwards Sapien 3 transcatheter heart valve placed via transapical approach   SVT (supraventricular tachycardia)    Tobacco abuse    Ventricular tachycardia (HCC)     PAST SURGICAL HISTORY: Past Surgical History:  Procedure Laterality Date   ABDOMINAL HYSTERECTOMY N/A 07/27/2013   Procedure: TUMOR EXCISION OF ABDOMINAL MASS;  Surgeon: Jeannette Corpus, MD;  Location: [facility]S;  Service: Gynecology;  Laterality: N/A;   APPENDECTOMY  1989   BILATERAL OOPHORECTOMY  2007   CARDIAC DEFIBRILLATOR PLACEMENT  02/06/2012   "lead change"   CARDIAC VALVE REPLACEMENT     CYSTOSCOPY Right 02/23/2013   Procedure: CYSTOSCOPY  WITH STENT REMOVAL;  Surgeon: Jeannette Corpus, MD;  Location: [facility];  Service: Gynecology;  Laterality: Right;   ICD GENERATOR CHANGEOUT N/A 04/03/2020   Procedure: ICD GENERATOR CHANGEOUT;  Surgeon: Marinus Maw, MD;  Location[facility] INVASIVE CV LAB;  Service: Cardiovascular;  Laterality: N/A;   IMPLANTABLE CARDIOVERTER DEFIBRILLATOR REVISION N/A 02/06/2012   Procedure: IMPLANTABLE CARDIOVERTER DEFIBRILLATOR REVISION;  Surgeon: Marinus Maw, MD;  Location: [facility] CATH LAB;  Service: Cardiovascular;  Laterality: N/A;   INSERT / REPLACE / REMOVE PACEMAKER  10/2009   DUAL-CHAMBER [place]. JUDE DEFIBRILLATOR   IR IMAGING GUIDED PORT INSERTION  05/11/2018    LAPAROTOMY N/A 01/19/2013   Procedure: TUMOR DEBULKING / BOWEL RESECTION/INSERTION RIGHT UTERERAL DOUBLE J STENT/OMENTECTOMY;  Surgeon: Jeannette Corpus, MD;  Location: [facility];  Service: Gynecology;  Laterality: N/A;   MITRAL VALVE REPLACEMENT  10/21/2009   29mm Edwards Perimount bovine pericardial tissue valve - [place]   RIGHT/LEFT HEART CATH AND CORONARY ANGIOGRAPHY N/A 07/02/2017   Procedure: RIGHT/LEFT HEART CATH AND CORONARY ANGIOGRAPHY;  Surgeon: Lyn Records, MD;  Location: [facility] INVASIVE CV LAB;  Service: Cardiovascular;  Laterality: N/A;   TEE WITHOUT CARDIOVERSION N/A 09/16/2017   Procedure: TRANSESOPHAGEAL ECHOCARDIOGRAM (TEE);  Surgeon: Tonny Bollman, MD;  Location: [facility] OR;  Service: Open Heart Surgery;  Laterality: N/A;   THYMECTOMY  2009   TONSILLECTOMY AND ADENOIDECTOMY  ~ 1967   TOTAL HIP ARTHROPLASTY  05/06/2012   Procedure: TOTAL HIP ARTHROPLASTY;  Surgeon: Loanne Drilling, MD;  Location: [facility];  Service: Orthopedics;  Laterality: Right;   TRANSCATHETER MITRAL VALVE REPLACEMENT, TRANSAPICAL N/A 09/16/2017   Procedure: TRANSCATHETER MITRAL VALVE REPLACEMENT,TRANSAPICAL;  Surgeon: Tonny Bollman, MD;  Location: [facility] OR;  Service: Open Heart Surgery;  Laterality: N/A;   TRANSURETHRAL RESECTION OF BLADDER  2009   "for bladder cancer"   TUBAL LIGATION  1993   VAGINAL HYSTERECTOMY  2001    FAMILY HISTORY:  Family History  Problem Relation Age of Onset   Ovarian cancer Mother    Heart attack Brother    Stroke Maternal Grandmother    Heart attack Maternal Grandfather    Heart attack Paternal Grandfather     SOCIAL HISTORY:  Social History   Tobacco Use   Smoking status: Former    Packs/day: 0.50    Years: 30.00    Additional pack years: 0.00    Total pack years: 15.00    Types: Cigarettes    Start date: 03/28/1974    Quit date: 12/2021    Years since quitting: 0.8   Smokeless tobacco: Never  Vaping Use   Vaping Use: Never used  Substance Use Topics    Alcohol use: No    Alcohol/week: 0.0 standard drinks of alcohol   Drug use: No    ALLERGIES:  Allergies  Allergen Reactions   Augmentin [Amoxicillin-Pot Clavulanate] Other (See Comments)    Muscle Aches   Prochlorperazine Edisylate Other (See Comments)    Nervous/ flutter/ shakes   Adhesive [Tape] Hives    Redness/hives from adhesive tape( tolerates latex gloves); tolerates paper tape    MEDICATIONS:  Current Outpatient Medications  Medication Sig Dispense Refill   albuterol (VENTOLIN HFA) 108 (90 Base) MCG/ACT inhaler TAKE 2 PUFFS BY MOUTH EVERY 6 HOURS AS NEEDED FOR WHEEZE OR SHORTNESS OF BREATH (Patient not taking: Reported on 10/17/2022) 6.7 each 1   amoxicillin (AMOXIL) 500 MG tablet Take 2,000 mg by mouth See admin instructions. Take 2,000 mg one hour prior  to dental visits. (Patient not taking: Reported on 10/17/2022)     aspirin EC 81 MG tablet Take 81 mg by mouth daily.     b complex vitamins capsule Take 1 capsule by mouth 2 (two) times daily.     carvedilol (COREG) 3.125 MG tablet TAKE ONE TABLET BY MOUTH TWICE DAILY WITH A MEAL 180 tablet 0   cholecalciferol (VITAMIN D3) 25 MCG (1000 UNIT) tablet Take 4,000 Units by mouth daily.     furosemide (LASIX) 20 MG tablet TAKE ONE TABLET BY MOUTH EVERY DAY 90 tablet 3   levothyroxine (SYNTHROID) 75 MCG tablet Take 1 tablet (75 mcg total) by mouth daily before breakfast. 30 tablet 3   lidocaine-prilocaine (EMLA) cream Apply 1 Application topically as needed. Apply to port per instructions 30 g 12   loperamide (IMODIUM A-D) 2 MG tablet Take 4 mg by mouth 3 (three) times daily as needed for diarrhea or loose stools. (Patient not taking: Reported on 10/17/2022)     LORazepam (ATIVAN) 1 MG tablet Take 1 tablet (1 mg total) by mouth every 8 (eight) hours as needed for anxiety. (Patient not taking: Reported on 10/17/2022) 90 tablet 0   methocarbamol (ROBAXIN) 500 MG tablet TAKE ONE TABLET BY MOUTH EVERY 6 HOURS AS NEEDED FOR MUSCLE SPASMS  (Patient not taking: Reported on 10/17/2022) 60 tablet 2   Potassium Chloride ER 20 MEQ TBCR Take 20 mEq by mouth daily. 90 tablet 3   sodium chloride 0.9 % SOLN 50 mL with pembrolizumab 100 MG/4ML SOLN 200 mg Inject 200 mg into the vein every 21 ( twenty-one) days.     spironolactone (ALDACTONE) 25 MG tablet Take 1 tablet (25 mg total) by mouth daily. 90 tablet 3   traMADol (ULTRAM) 50 MG tablet TAKE ONE TABLET BY MOUTH EVERY 6 HOURS AS NEEDED 90 tablet 0   Vitamin D, Ergocalciferol, (DRISDOL) 1.25 MG (50000 UNIT) CAPS capsule TAKE ONE CAPSULE BY MOUTH every 7 DAYS 12 capsule 0   warfarin (COUMADIN) 7.5 MG tablet Take 1 tablet by mouth daily except 1/2 tablet on Monday and Fridays or as directed by Anticoagulation Clinic. 90 tablet 1   zolpidem (AMBIEN) 10 MG tablet TAKE ONE TABLET BY MOUTH EVERY DAY 30 tablet 0   No current facility-administered medications for this encounter.   Facility-Administered Medications Ordered in Other Encounters  Medication Dose Route Frequency Provider Last Rate Last Admin   sodium chloride flush (NS) 0.9 % injection 10 mL  10 mL Intravenous PRN Josph Macho, MD   10 mL at 09/14/18 1150    REVIEW OF SYSTEMS:  Notable for that above.   PHYSICAL EXAM:  weight is 172 lb (78 kg). Her temperature is 98.5 F (36.9 C). Her blood pressure is 110/66 and her pulse is 78. Her respiration is 18 and oxygen saturation is 98%.   General: Alert and oriented, in no acute distress HEENT: Head is normocephalic. Extraocular movements are intact.  Neck: Neck is supple, no palpable cervical or supraclavicular lymphadenopathy. Heart: Regular in rate and rhythm with no murmurs, rubs, or gallops. Chest: Clear to auscultation bilaterally, with no rhonchi, wheezes, or rales. Abdomen: Soft, nontender, non-distended. Slight tenderness to palpation throughout.  Extremities: No cyanosis or edema. Lymphatics: see Neck Exam Skin: No concerning lesions. Musculoskeletal: symmetric  strength and muscle tone throughout. Neurologic: Cranial nerves II through XII are grossly intact. No obvious focalities. Speech is fluent. Coordination is intact. Psychiatric: Judgment and insight are intact. Affect is appropriate.  ECOG =  0  0 - Asymptomatic (Fully active, able to carry on all predisease activities without restriction)  1 - Symptomatic but completely ambulatory (Restricted in physically strenuous activity but ambulatory and able to carry out work of a light or sedentary nature. For example, light housework, office work)  2 - Symptomatic, <50% in bed during the day (Ambulatory and capable of all self care but unable to carry out any work activities. Up and about more than 50% of waking hours)  3 - Symptomatic, >50% in bed, but not bedbound (Capable of only limited self-care, confined to bed or chair 50% or more of waking hours)  4 - Bedbound (Completely disabled. Cannot carry on any self-care. Totally confined to bed or chair)  5 - Death   Santiago Glad MM, Creech RH, Tormey DC, et al. [phone]). "Toxicity and response criteria of the [facility]". Am. Evlyn Clines. Oncol. 5 (6): 649-55  LABORATORY DATA:  Lab Results  Component Value Date   WBC 6.5 10/17/2022   HGB 11.8 (L) 10/17/2022   HCT 34.4 (L) 10/17/2022   MCV 90.8 10/17/2022   PLT 169 10/17/2022   NEUTROABS 5.2 10/17/2022   Lab Results  Component Value Date   NA 136 10/17/2022   K 3.1 (L) 10/17/2022   CL 101 10/17/2022   CO2 28 10/17/2022   GLUCOSE 92 10/17/2022   BUN 12 10/17/2022   CREATININE 1.25 (H) 10/17/2022   CALCIUM 9.0 10/17/2022      RADIOGRAPHY: CUP PACEART INCLINIC DEVICE CHECK  Result Date: 10/16/2022 ICD check in clinic. Normal device function. Thresholds and sensing consistent with previous device measurements. Impedance trends stable over time. No mode switches. No ventricular arrhythmias. Histogram distribution appropriate for patient and level of  activity Device programmed  at appropriate safety margins. Device programmed to optimize intrinsic conduction. Estimated longevity _5.7-7.5 years__. Pt enrolled in remote follow-up. Patient education completed including shock plan. Auditory/vibratory alert demonstrated. A sensitivity adjusted  CT CHEST ABDOMEN PELVIS W CONTRAST  Result Date: 10/13/2022 CLINICAL DATA:  Recurrent granulosa cell tumor iron deficiency anemia, status post total hysterectomy, thyroidectomy, and appendectomy * Tracking Code: BO * EXAM: CT CHEST, ABDOMEN, AND PELVIS WITH CONTRAST TECHNIQUE: Multidetector CT imaging of the chest, abdomen and pelvis was performed following the standard protocol during bolus administration of intravenous contrast. RADIATION DOSE REDUCTION: This exam was performed according to the departmental dose-optimization program which includes automated exposure control, adjustment of the mA and/or kV according to patient size and/or use of iterative reconstruction technique. CONTRAST:  OMNIPAQUE IOHEXOL 300 MG/ML SOLN additional oral enteric contrast COMPARISON:  CT chest abdomen pelvis, 04/24/2022 FINDINGS: CT CHEST FINDINGS Cardiovascular: Left chest multi lead pacer. Aortic atherosclerosis. Normal heart size. Left ventricular fibrofatty myocardial scarring with aneurysm of the left ventricular apex and inferior wall of the left ventricle (series 2, image 44). Left and right coronary artery calcifications. Mitral valve stent endograft. No pericardial effusion. Mediastinum/Nodes: No enlarged mediastinal, hilar, or axillary lymph nodes. Thyroid gland, trachea, and esophagus demonstrate no significant findings. Lungs/Pleura: Diffuse bilateral bronchial wall thickening. Background of very fine centrilobular pulmonary nodularity. No significant change in multiple scattered ground-glass pulmonary nodules, for example in the peripheral left upper lobe measuring 0.8 cm (series 4, image 44). No pleural effusion or pneumothorax. Musculoskeletal:  No chest wall abnormality. No acute osseous findings. CT ABDOMEN PELVIS FINDINGS Hepatobiliary: No solid liver abnormality is seen. No gallstones, gallbladder wall thickening, or biliary dilatation. Pancreas: Unremarkable. No pancreatic ductal dilatation or surrounding inflammatory changes.  Spleen: Normal in size without significant abnormality. Adrenals/Urinary Tract: Adrenal glands are unremarkable. Kidneys are normal, without renal calculi, solid lesion, or hydronephrosis. Bladder is unremarkable. Stomach/Bowel: Stomach is within normal limits. Appendix is surgically absent. No evidence of bowel wall thickening, distention, or inflammatory changes. Evidence of prior rectal resection and reanastomosis. Vascular/Lymphatic: Aortic atherosclerosis. No enlarged abdominal or pelvic lymph nodes. Reproductive: Status post hysterectomy. Partially calcified peritoneal implant near the vaginal cuff, measuring 4.0 x 3.0 cm (series 2, image 111). Other: No abdominal wall hernia or abnormality. No ascites. No significant change in peritoneal implants, including in the right paracolic gutter measuring 2.5 x 1.4 cm (series 2, image 91) Musculoskeletal: Interval enlargement of a low attenuation lesion overlying the ventral left rectus musculature measuring 2.1 x 1.2 cm, previously 1.6 x 0.7 cm (series 2, image 99). No acute osseous findings. Status post right hip total arthroplasty. IMPRESSION: 1. Interval enlargement of a low attenuation lesion overlying the ventral left rectus musculature measuring 2.1 x 1.2 cm, previously 1.6 x 0.7 cm. This is suspicious for an enlarging soft tissue metastasis. 2. No significant change in peritoneal implants, including in the right paracolic gutter and near the vaginal cuff. 3. No significant change in multiple scattered subcentimeter ground-glass pulmonary nodules, nonspecific. Attention on follow-up. 4. Diffuse bilateral bronchial wall thickening with a background of very fine centrilobular  pulmonary nodularity, most commonly seen in smoking-related respiratory bronchiolitis. 5. Coronary artery disease. 6. Status post hysterectomy and appendectomy. Aortic Atherosclerosis (ICD10-I70.0). Electronically Signed   By: Jearld Lesch M.D.   On: 10/13/2022 23:55   CUP PACEART REMOTE DEVICE CHECK  Result Date: 10/02/2022 Scheduled remote reviewed. Normal device function.  RV threshold 1.875V @ 1ms, stable trend Next remote 91 days. 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 Because of this, it is reasonable to treat this area. Dr. Roselind Messier recommends 30 Gy in 10 fractions to the visualized lesion overlying the rectus abdominal musculature.   Today, I talked to the patient  about the findings and work-up thus far.  We discussed the natural history of metastatic ovarian granulosa cell tumors and general treatment, highlighting the role of radiotherapy in the management.  We discussed the available radiation techniques, and focused on the details of logistics and delivery.  We reviewed the anticipated acute and late sequelae associated with radiation in this setting.  The patient was encouraged to ask questions that I answered to the best of my ability. A patient consent form was discussed and signed.  We retained a copy for our records.  The patient would like to proceed with radiation and will be scheduled for CT simulation.  PLAN: Patient will be scheduled for CT simulation with plans to begin treatment approximately 1 week after.     60 minutes of total time was spent for this patient encounter, including preparation, face-to-face counseling with the patient and coordination of care, physical exam, and documentation of the encounter.    ------------------------------------------------   Joyice Faster, PA-C   Billie Lade, PhD, MD  This document serves as a record of services personally performed by Antony Blackbird, MD. It was created on his behalf by Neena Rhymes, a trained medical scribe. The creation of this record is based on the scribe's personal observations and the provider's  statements to them. This document has been checked and approved by the attending provider.

## 2022-11-06 ENCOUNTER — Ambulatory Visit
Admission: RE | Admit: 2022-11-06 | Discharge: 2022-11-06 | Disposition: A | Discharge status: Home / Self Care | Payer: 59 | Source: Ambulatory Visit | Attending: Radiation Oncology | Admitting: Radiation Oncology

## 2022-11-06 DIAGNOSIS — D3910 Neoplasm of uncertain behavior of unspecified ovary: Secondary | ICD-10-CM | POA: Diagnosis present

## 2022-11-07 ENCOUNTER — Inpatient Hospital Stay: Payer: 59 | Admitting: Hematology & Oncology

## 2022-11-07 ENCOUNTER — Inpatient Hospital Stay: Payer: 59

## 2022-11-08 NOTE — Progress Notes (Signed)
Remote ICD transmission.   

## 2022-11-12 ENCOUNTER — Other Ambulatory Visit: Payer: Self-pay | Admitting: Hematology & Oncology

## 2022-11-12 DIAGNOSIS — D3910 Neoplasm of uncertain behavior of unspecified ovary: Secondary | ICD-10-CM

## 2022-11-12 DIAGNOSIS — F419 Anxiety disorder, unspecified: Secondary | ICD-10-CM

## 2022-11-14 ENCOUNTER — Encounter: Payer: Self-pay | Admitting: Medical Oncology

## 2022-11-14 ENCOUNTER — Inpatient Hospital Stay (HOSPITAL_BASED_OUTPATIENT_CLINIC_OR_DEPARTMENT_OTHER): Payer: 59 | Admitting: Medical Oncology

## 2022-11-14 ENCOUNTER — Inpatient Hospital Stay: Payer: 59 | Attending: Hematology & Oncology

## 2022-11-14 ENCOUNTER — Inpatient Hospital Stay: Payer: 59

## 2022-11-14 ENCOUNTER — Telehealth: Payer: Self-pay | Admitting: *Deleted

## 2022-11-14 VITALS — BP 96/55 | HR 70 | Temp 97.4°F | Resp 17 | Wt 171.0 lb

## 2022-11-14 DIAGNOSIS — C569 Malignant neoplasm of unspecified ovary: Secondary | ICD-10-CM | POA: Diagnosis present

## 2022-11-14 DIAGNOSIS — K909 Intestinal malabsorption, unspecified: Secondary | ICD-10-CM | POA: Diagnosis not present

## 2022-11-14 DIAGNOSIS — E039 Hypothyroidism, unspecified: Secondary | ICD-10-CM | POA: Insufficient documentation

## 2022-11-14 DIAGNOSIS — Z7901 Long term (current) use of anticoagulants: Secondary | ICD-10-CM | POA: Diagnosis not present

## 2022-11-14 DIAGNOSIS — D500 Iron deficiency anemia secondary to blood loss (chronic): Secondary | ICD-10-CM | POA: Diagnosis not present

## 2022-11-14 DIAGNOSIS — Z5112 Encounter for antineoplastic immunotherapy: Secondary | ICD-10-CM | POA: Insufficient documentation

## 2022-11-14 DIAGNOSIS — D509 Iron deficiency anemia, unspecified: Secondary | ICD-10-CM | POA: Insufficient documentation

## 2022-11-14 DIAGNOSIS — Z79811 Long term (current) use of aromatase inhibitors: Secondary | ICD-10-CM | POA: Diagnosis not present

## 2022-11-14 DIAGNOSIS — D3910 Neoplasm of uncertain behavior of unspecified ovary: Secondary | ICD-10-CM

## 2022-11-14 DIAGNOSIS — D508 Other iron deficiency anemias: Secondary | ICD-10-CM | POA: Diagnosis present

## 2022-11-14 LAB — CBC WITH DIFFERENTIAL (CANCER CENTER ONLY)
Abs Immature Granulocytes: 0.08 10*3/uL — ABNORMAL HIGH (ref 0.00–0.07)
Basophils Absolute: 0 10*3/uL (ref 0.0–0.1)
Basophils Relative: 1 %
Eosinophils Absolute: 0.1 10*3/uL (ref 0.0–0.5)
Eosinophils Relative: 1 %
HCT: 33.8 % — ABNORMAL LOW (ref 36.0–46.0)
Hemoglobin: 11.1 g/dL — ABNORMAL LOW (ref 12.0–15.0)
Immature Granulocytes: 1 %
Lymphocytes Relative: 8 %
Lymphs Abs: 0.6 10*3/uL — ABNORMAL LOW (ref 0.7–4.0)
MCH: 30.4 pg (ref 26.0–34.0)
MCHC: 32.8 g/dL (ref 30.0–36.0)
MCV: 92.6 fL (ref 80.0–100.0)
Monocytes Absolute: 0.7 10*3/uL (ref 0.1–1.0)
Monocytes Relative: 9 %
Neutro Abs: 6 10*3/uL (ref 1.7–7.7)
Neutrophils Relative %: 80 %
Platelet Count: 184 10*3/uL (ref 150–400)
RBC: 3.65 MIL/uL — ABNORMAL LOW (ref 3.87–5.11)
RDW: 13.8 % (ref 11.5–15.5)
WBC Count: 7.4 10*3/uL (ref 4.0–10.5)
nRBC: 0 % (ref 0.0–0.2)

## 2022-11-14 LAB — CMP (CANCER CENTER ONLY)
ALT: 12 U/L (ref 0–44)
AST: 16 U/L (ref 15–41)
Albumin: 3.9 g/dL (ref 3.5–5.0)
Alkaline Phosphatase: 36 U/L — ABNORMAL LOW (ref 38–126)
Anion gap: 7 (ref 5–15)
BUN: 9 mg/dL (ref 8–23)
CO2: 29 mmol/L (ref 22–32)
Calcium: 8.8 mg/dL — ABNORMAL LOW (ref 8.9–10.3)
Chloride: 101 mmol/L (ref 98–111)
Creatinine: 1.11 mg/dL — ABNORMAL HIGH (ref 0.44–1.00)
GFR, Estimated: 56 mL/min — ABNORMAL LOW (ref >60)
Glucose, Bld: 102 mg/dL — ABNORMAL HIGH (ref 70–99)
Potassium: 3 mmol/L — ABNORMAL LOW (ref 3.5–5.1)
Sodium: 137 mmol/L (ref 135–145)
Total Bilirubin: 0.4 mg/dL (ref 0.3–1.2)
Total Protein: 6 g/dL — ABNORMAL LOW (ref 6.5–8.1)

## 2022-11-14 LAB — RETICULOCYTES
Immature Retic Fract: 13 % (ref 2.3–15.9)
RBC.: 3.65 MIL/uL — ABNORMAL LOW (ref 3.87–5.11)
Retic Count, Absolute: 70.4 10*3/uL (ref 19.0–186.0)
Retic Ct Pct: 1.9 % (ref 0.4–3.1)

## 2022-11-14 LAB — FERRITIN: Ferritin: 25 ng/mL (ref 11–307)

## 2022-11-14 LAB — IRON AND IRON BINDING CAPACITY (CC-WL,HP ONLY)
Iron: 51 ug/dL (ref 28–170)
Saturation Ratios: 16 % (ref 10.4–31.8)
TIBC: 329 ug/dL (ref 250–450)
UIBC: 278 ug/dL (ref 148–442)

## 2022-11-14 LAB — TSH: TSH: 39.454 u[IU]/mL — ABNORMAL HIGH (ref 0.350–4.500)

## 2022-11-14 MED ORDER — SODIUM CHLORIDE 0.9% FLUSH
10.0000 mL | INTRAVENOUS | Status: DC | PRN
  Administered 2022-11-14: 10 mL

## 2022-11-14 MED ORDER — HEPARIN SOD (PORK) LOCK FLUSH 100 UNIT/ML IV SOLN
500.0000 [IU] | Freq: Once | INTRAVENOUS | Status: AC | PRN
  Administered 2022-11-14: 500 [IU]

## 2022-11-14 MED ORDER — SODIUM CHLORIDE 0.9 % IV SOLN: Freq: Once | INTRAVENOUS | Status: AC

## 2022-11-14 MED ORDER — POTASSIUM CHLORIDE ER 20 MEQ PO TBCR: 20.0000 meq | EXTENDED_RELEASE_TABLET | Freq: Every day | ORAL | 3 refills | Status: DC

## 2022-11-14 MED ORDER — POTASSIUM CHLORIDE CRYS ER 20 MEQ PO TBCR: 40.0000 meq | EXTENDED_RELEASE_TABLET | Freq: Two times a day (BID) | ORAL | Status: AC

## 2022-11-14 MED ORDER — POTASSIUM CHLORIDE CRYS ER 20 MEQ PO TBCR
40.0000 meq | EXTENDED_RELEASE_TABLET | Freq: Two times a day (BID) | ORAL | Status: DC
  Administered 2022-11-14 (×2): 40 meq via ORAL
  Filled 2022-11-14: qty 2

## 2022-11-14 MED ORDER — SODIUM CHLORIDE 0.9 % IV SOLN
200.0000 mg | Freq: Once | INTRAVENOUS | Status: AC
  Administered 2022-11-14: 200 mg via INTRAVENOUS
  Filled 2022-11-14: qty 8

## 2022-11-14 NOTE — Progress Notes (Signed)
Hematology and Oncology Follow Up Visit  [name] [id number] 1960-09-18 62 y.o. 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 She reports that she has been doing well. She asks for a refill of potassium as she has been out of this rx for a while.   Her last Inhibin B level increased a bit to 230 on 10/17/2022.   Followed by Dr. Roselind Messier of Radiation Oncology.  Their plan is to start XRT soon.   She is having no abdominal pain.  There is no cough or shortness of breath.  There is no change in bowel or bladder habits. No unintentional weight loss or night sweats.   She is on Coumadin secondary to her cardiac valve.  She is being managed by Cardiology for this.  She does get IV iron on occasion.  She last got IV iron back in March.  At that time, her ferritin was 17 with an iron saturation of 16%.    She has tolerated the immunotherapy fairly well other than hypothyroidism.  When we checked her last TSH it was 66. She continues to improve on Levothyroxine titration.   Overall, her performance status is ECOG 1.    Medications:  Allergies as of 11/14/2022       Reactions   Augmentin [amoxicillin-pot Clavulanate] Other (See Comments)   Muscle Aches   Prochlorperazine Edisylate Other (See Comments)   Nervous/ flutter/ shakes   Adhesive [tape] Hives   Redness/hives from adhesive tape( tolerates latex gloves); tolerates paper tape        Medication List        Accurate as of Nov 14, 2022  3:01 PM. If you have any questions, ask your  nurse or doctor.          albuterol 108 (90 Base) MCG/ACT inhaler Commonly known as: VENTOLIN HFA TAKE 2 PUFFS BY MOUTH EVERY 6 HOURS AS NEEDED FOR WHEEZE OR SHORTNESS OF BREATH   amoxicillin 500 MG tablet Commonly known as: AMOXIL Take 2,000 mg by mouth See admin instructions. Take 2,000 mg one hour prior to dental visits.   aspirin EC 81 MG tablet Take 81 mg by mouth daily.   b complex vitamins capsule Take 1 capsule by mouth 2 (two) times daily.   carvedilol 3.125 MG tablet Commonly known as: COREG TAKE ONE TABLET BY MOUTH TWICE DAILY WITH A MEAL   cholecalciferol 25 MCG (1000 UNIT) tablet Commonly known as: VITAMIN D3 Take 4,000 Units by mouth daily.   furosemide 20 MG tablet Commonly known as: LASIX TAKE ONE TABLET BY MOUTH EVERY DAY   levothyroxine 75 MCG tablet Commonly known as: Synthroid Take 1 tablet (75 mcg total) by mouth daily before breakfast.   lidocaine-prilocaine cream Commonly known as: EMLA Apply 1 Application topically as needed. Apply to port per instructions   loperamide 2 MG tablet Commonly known as: IMODIUM A-D Take 4 mg by mouth 3 (three) times daily as needed for diarrhea or loose stools.   LORazepam 1 MG tablet Commonly known as: ATIVAN Take  1 tablet (1 mg total) by mouth every 8 (eight) hours as needed for anxiety.   methocarbamol 500 MG tablet Commonly known as: ROBAXIN TAKE ONE TABLET BY MOUTH EVERY 6 HOURS AS NEEDED FOR MUSCLE SPASMS   Potassium Chloride ER 20 MEQ Tbcr Take 1 tablet (20 mEq total) by mouth daily.   sodium chloride 0.9 % SOLN 50 mL with pembrolizumab 100 MG/4ML SOLN 200 mg Inject 200 mg into the vein every 21 ( twenty-one) days.   spironolactone 25 MG tablet Commonly known as: ALDACTONE Take 1 tablet (25 mg total) by mouth daily.   traMADol 50 MG tablet Commonly known as: ULTRAM TAKE ONE TABLET BY MOUTH EVERY 6 HOURS AS NEEDED   Vitamin D (Ergocalciferol) 1.25 MG (50000 UNIT) Caps capsule Commonly known  as: DRISDOL TAKE ONE CAPSULE BY MOUTH every 7 DAYS   warfarin 7.5 MG tablet Commonly known as: COUMADIN Take as directed by the anticoagulation clinic. If you are unsure how to take this medication, talk to your nurse or doctor. Original instructions: Take 1 tablet by mouth daily except 1/2 tablet on Monday and Fridays or as directed by Anticoagulation Clinic.   zolpidem 10 MG tablet Commonly known as: AMBIEN TAKE ONE TABLET BY MOUTH EVERY DAY        Allergies:  Allergies  Allergen Reactions   Augmentin [Amoxicillin-Pot Clavulanate] Other (See Comments)    Muscle Aches   Prochlorperazine Edisylate Other (See Comments)    Nervous/ flutter/ shakes   Adhesive [Tape] Hives    Redness/hives from adhesive tape( tolerates latex gloves); tolerates paper tape    Past Medical History, Surgical history, Social history, and Family History were reviewed and updated.  Review of Systems:  Review of Systems  Constitutional: Negative.   HENT: Negative.    Eyes: Negative.   Respiratory: Negative.    Cardiovascular: Negative.   Gastrointestinal: Negative.   Genitourinary: Negative.   Musculoskeletal: Negative.   Skin: Negative.   Neurological: Negative.   Endo/Heme/Allergies: Negative.   Psychiatric/Behavioral: Negative.        Exam:  weight is 171 lb (77.6 kg). Her oral temperature is 97.4 F (36.3 C) (abnormal). Her blood pressure is 96/55 (abnormal) and her pulse is 70. Her respiration is 17 and oxygen saturation is 99%.   Wt Readings from Last 3 Encounters:  11/14/22 171 lb (77.6 kg)  10/31/22 172 lb (78 kg)  10/17/22 170 lb 12 oz (77.5 kg)    Physical Exam Vitals reviewed.  HENT:     Head: Normocephalic and atraumatic.  Eyes:     Pupils: Pupils are equal, round, and reactive to light.  Cardiovascular:     Rate and Rhythm: Normal rate and regular rhythm.     Heart sounds: Normal heart sounds.  Pulmonary:     Effort: Pulmonary effort is normal.     Breath sounds:  Normal breath sounds.  Abdominal:     General: Bowel sounds are normal.     Palpations: Abdomen is soft.  Musculoskeletal:        General: No tenderness or deformity. Normal range of motion.     Cervical back: Normal range of motion.  Lymphadenopathy:     Cervical: No cervical adenopathy.  Skin:    General: Skin is warm and dry.     Findings: No erythema or rash.  Neurological:     Mental Status: She is alert and oriented to person, place, and time.  Psychiatric:        Behavior: Behavior  normal.        Thought Content: Thought content normal.        Judgment: Judgment normal.      Lab Results  Component Value Date   WBC 7.4 11/14/2022   HGB 11.1 (L) 11/14/2022   HCT 33.8 (L) 11/14/2022   MCV 92.6 11/14/2022   PLT 184 11/14/2022   Lab Results  Component Value Date   FERRITIN 32 10/17/2022   IRON 67 10/17/2022   TIBC 354 10/17/2022   UIBC 287 10/17/2022   IRONPCTSAT 19 10/17/2022   Lab Results  Component Value Date   RETICCTPCT 1.9 11/14/2022   RBC 3.65 (L) 11/14/2022   RBC 3.65 (L) 11/14/2022   No results found for: "KPAFRELGTCHN", "LAMBDASER", "KAPLAMBRATIO" No results found for: "IGGSERUM", "IGA", "IGMSERUM" No results found for: "TOTALPROTELP", "ALBUMINELP", "A1GS", "A2GS", "BETS", "BETA2SER", "GAMS", "MSPIKE", "SPEI"   Chemistry      Component Value Date/Time   NA 137 11/14/2022 1115   NA 139 08/14/2020 1428   NA 140 03/15/2016 0953   K 3.0 (L) 11/14/2022 1115   K 3.9 03/15/2016 0953   CL 101 11/14/2022 1115   CL 102 12/13/2015 0803   CO2 29 11/14/2022 1115   CO2 19 (L) 03/15/2016 0953   BUN 9 11/14/2022 1115   BUN 16 08/14/2020 1428   BUN 15.9 03/15/2016 0953   CREATININE 1.11 (H) 11/14/2022 1115   CREATININE 1.4 (H) 03/15/2016 0953      Component Value Date/Time   CALCIUM 8.8 (L) 11/14/2022 1115   CALCIUM 9.3 03/15/2016 0953   ALKPHOS 36 (L) 11/14/2022 1115   ALKPHOS 55 03/15/2016 0953   AST 16 11/14/2022 1115   AST 17 03/15/2016 0953    ALT 12 11/14/2022 1115   ALT 23 03/15/2016 0953   BILITOT 0.4 11/14/2022 1115   BILITOT 0.38 03/15/2016 0953       Impression and Plan: [name] is a very pleasant 62 yo caucasian female with recurrent granulosa cell tumor.  We now have her on immunotherapy.  We are trying to avoid systemic chemotherapy if possible.  She is doing fairly well on the Keytruda despite the hypothyroidism. Will adjust her levothyroxine if needed.   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  Disposition Keytruda today RTC 3 weeks APP/MD, labs (CBC w/, CMP, TSH, Inhibin B, ferritin, iron)-[place]   Rushie Chestnut, [place] 5/23/20243:01 PM

## 2022-11-14 NOTE — Telephone Encounter (Addendum)
Called pt after receiving a message that the pt needs to change appt to later. Returned call to her and there was no answer, therefore, left her a message.   Pt returned call and moved appt to another day she will be in [place] for another appt. She was thankful.

## 2022-11-14 NOTE — Patient Instructions (Signed)

## 2022-11-14 NOTE — Patient Instructions (Signed)
[facility]  Discharge Instructions: Thank you for choosing [facility] to provide your oncology and hematology care.   If you have a lab appointment with the Cancer Center, please go directly to the Cancer Center and check in at the registration area.  Wear comfortable clothing and clothing appropriate for easy access to any Portacath or PICC line.   We strive to give you quality time with your provider. You may need to reschedule your appointment if you arrive late (15 or more minutes).  Arriving late affects you and other patients whose appointments are after yours.  Also, if you miss three or more appointments without notifying the office, you may be dismissed from the clinic at the provider's discretion.      For prescription refill requests, have your pharmacy contact our office and allow 72 hours for refills to be completed.    Today you received the following chemotherapy and/or immunotherapy agents [name]      To help prevent nausea and vomiting after your treatment, we encourage you to take your nausea medication as directed.  BELOW ARE SYMPTOMS THAT SHOULD BE REPORTED IMMEDIATELY: *FEVER GREATER THAN 100.4 F (38 C) OR HIGHER *CHILLS OR SWEATING *NAUSEA AND VOMITING THAT IS NOT CONTROLLED WITH YOUR NAUSEA MEDICATION *UNUSUAL SHORTNESS OF BREATH *UNUSUAL BRUISING OR BLEEDING *URINARY PROBLEMS (pain or burning when urinating, or frequent urination) *BOWEL PROBLEMS (unusual diarrhea, constipation, pain near the anus) TENDERNESS IN MOUTH AND THROAT WITH OR WITHOUT PRESENCE OF ULCERS (sore throat, sores in mouth, or a toothache) UNUSUAL RASH, SWELLING OR PAIN  UNUSUAL VAGINAL DISCHARGE OR ITCHING   Items with * indicate a potential emergency and should be followed up as soon as possible or go to the Emergency Department if any problems should occur.  Please show the CHEMOTHERAPY ALERT CARD or IMMUNOTHERAPY ALERT CARD at  check-in to the Emergency Department and triage nurse. Should you have questions after your visit or need to cancel or reschedule your appointment, please contact [facility] [place]  [phone] and follow the prompts.  Office hours are 8:00 a.m. to 4:30 p.m. Monday - Friday. Please note that voicemails left after 4:00 p.m. may not be returned until the following business day.  We are closed weekends and major holidays. You have access to a nurse at all times for urgent questions. Please call the main number to the clinic [phone] and follow the prompts.  For any non-urgent questions, you may also contact your provider using MyChart. We now offer e-Visits for anyone 52 and older to request care online for non-urgent symptoms. For details visit [email or web address].   Also download the MyChart app! Go to the app store, search "MyChart", open the app, select [place], and log in with your MyChart username and password.

## 2022-11-15 ENCOUNTER — Telehealth: Payer: Self-pay | Admitting: *Deleted

## 2022-11-15 ENCOUNTER — Encounter: Payer: Self-pay | Admitting: Hematology & Oncology

## 2022-11-15 ENCOUNTER — Other Ambulatory Visit: Payer: Self-pay | Admitting: Hematology & Oncology

## 2022-11-15 LAB — INHIBIN B: Inhibin B: 291.5 pg/mL — ABNORMAL HIGH (ref 0.0–16.9)

## 2022-11-15 MED ORDER — LEVOTHYROXINE SODIUM 88 MCG PO TABS: 88.0000 ug | ORAL_TABLET | Freq: Every day | ORAL | 1 refills | Status: DC

## 2022-11-15 NOTE — Telephone Encounter (Signed)
MyChart msg to pt regarding synthroid dose increase. Rx sent to pharmacy.

## 2022-11-15 NOTE — Telephone Encounter (Signed)
Error in charting.

## 2022-11-15 NOTE — Telephone Encounter (Deleted)
-----   Message from Josph Macho, MD sent at 11/15/2022 11:10 AM EDT ----- Call and let her know that the thyroid is still little bit on the lower side.  We need to increase her Synthroid to 88 mcg.  Please call this in.  Thanks.  Cindee Lame

## 2022-11-19 DIAGNOSIS — D3910 Neoplasm of uncertain behavior of unspecified ovary: Secondary | ICD-10-CM | POA: Diagnosis not present

## 2022-11-19 NOTE — Addendum Note (Signed)
Encounter addended by: Antony Blackbird, MD on: 11/19/2022 8:40 AM  Actions taken: Clinical Note Signed

## 2022-11-20 ENCOUNTER — Other Ambulatory Visit: Payer: Self-pay

## 2022-11-20 ENCOUNTER — Ambulatory Visit
Admission: RE | Admit: 2022-11-20 | Discharge: 2022-11-20 | Disposition: A | Discharge status: Home / Self Care | Payer: 59 | Source: Ambulatory Visit | Attending: Radiation Oncology | Admitting: Radiation Oncology

## 2022-11-20 DIAGNOSIS — D3910 Neoplasm of uncertain behavior of unspecified ovary: Secondary | ICD-10-CM | POA: Insufficient documentation

## 2022-11-20 LAB — RAD ONC ARIA SESSION SUMMARY
Course Elapsed Days: 0
Plan Fractions Treated to Date: 1
Plan Prescribed Dose Per Fraction: 3 Gy
Plan Total Fractions Prescribed: 10
Plan Total Prescribed Dose: 30 Gy
Reference Point Dosage Given to Date: 3 Gy
Reference Point Session Dosage Given: 3 Gy
Session Number: 1

## 2022-11-21 ENCOUNTER — Telehealth: Payer: Self-pay | Admitting: *Deleted

## 2022-11-21 ENCOUNTER — Other Ambulatory Visit: Payer: Self-pay

## 2022-11-21 ENCOUNTER — Ambulatory Visit
Admission: RE | Admit: 2022-11-21 | Discharge: 2022-11-21 | Disposition: A | Discharge status: Home / Self Care | Payer: 59 | Source: Ambulatory Visit | Attending: Radiation Oncology | Admitting: Radiation Oncology

## 2022-11-21 DIAGNOSIS — D3910 Neoplasm of uncertain behavior of unspecified ovary: Secondary | ICD-10-CM | POA: Diagnosis not present

## 2022-11-21 LAB — RAD ONC ARIA SESSION SUMMARY
Course Elapsed Days: 1
Plan Fractions Treated to Date: 2
Plan Prescribed Dose Per Fraction: 3 Gy
Plan Total Fractions Prescribed: 10
Plan Total Prescribed Dose: 30 Gy
Reference Point Dosage Given to Date: 6 Gy
Reference Point Session Dosage Given: 3 Gy
Session Number: 2

## 2022-11-21 NOTE — Telephone Encounter (Signed)
-----   Message from Stacy Macho, MD sent at 11/20/2022  4:56 PM EDT ----- I left a message for her on her answering machine/cellular phone.  Told her that the Inhibin B is getting worse.  It is now almost 300.  I think this is indicative of her cancer progressing.  I told her that we really need to get another CT scan and likely another biopsy so we can get molecular markers.  The last molecular markers we did on her was for several years ago.  Things have changed and we have better treatments that hopefully not chemotherapy.  I told her to call us back at the office to let us know that she got this message and to let us know what she would like for [place] to do.   [name]

## 2022-11-21 NOTE — Telephone Encounter (Signed)
Call placed to patient to f/u with message Dr. Myna Hidalgo left for pt yesterday afternoon.  Pt states that she did not receive Dr. Gustavo Lah message.  Pt then notified per order of Dr. Myna Hidalgo that "the Inhibin B is getting worse.  It is now almost 300. I think this is indicative of her cancer progressing.  I told her that we really need to get another CT scan and likely another biopsy so we can get molecular markers.  The last molecular markers we did on her was from several years ago.  Things have changed and we have better treatments that hopefully are not chemotherapy."  Pt states that she would like to move forward with CT scan and biopsy and is appreciative of call.  Dr. Myna Hidalgo notified.

## 2022-11-22 ENCOUNTER — Other Ambulatory Visit: Payer: Self-pay

## 2022-11-22 ENCOUNTER — Ambulatory Visit
Admission: RE | Admit: 2022-11-22 | Discharge: 2022-11-22 | Disposition: A | Discharge status: Home / Self Care | Payer: 59 | Source: Ambulatory Visit | Attending: Radiation Oncology | Admitting: Radiation Oncology

## 2022-11-22 DIAGNOSIS — D3910 Neoplasm of uncertain behavior of unspecified ovary: Secondary | ICD-10-CM | POA: Diagnosis not present

## 2022-11-22 LAB — RAD ONC ARIA SESSION SUMMARY
Course Elapsed Days: 2
Plan Fractions Treated to Date: 3
Plan Prescribed Dose Per Fraction: 3 Gy
Plan Total Fractions Prescribed: 10
Plan Total Prescribed Dose: 30 Gy
Reference Point Dosage Given to Date: 9 Gy
Reference Point Session Dosage Given: 3 Gy
Session Number: 3

## 2022-11-25 ENCOUNTER — Ambulatory Visit: Payer: 59 | Attending: Cardiology | Admitting: *Deleted

## 2022-11-25 ENCOUNTER — Ambulatory Visit
Admission: RE | Admit: 2022-11-25 | Discharge: 2022-11-25 | Disposition: A | Discharge status: Home / Self Care | Payer: 59 | Source: Ambulatory Visit | Attending: Radiation Oncology | Admitting: Radiation Oncology

## 2022-11-25 ENCOUNTER — Other Ambulatory Visit: Payer: Self-pay

## 2022-11-25 DIAGNOSIS — D3910 Neoplasm of uncertain behavior of unspecified ovary: Secondary | ICD-10-CM | POA: Insufficient documentation

## 2022-11-25 DIAGNOSIS — Z953 Presence of xenogenic heart valve: Secondary | ICD-10-CM | POA: Diagnosis not present

## 2022-11-25 DIAGNOSIS — I480 Paroxysmal atrial fibrillation: Secondary | ICD-10-CM

## 2022-11-25 DIAGNOSIS — Z952 Presence of prosthetic heart valve: Secondary | ICD-10-CM

## 2022-11-25 LAB — RAD ONC ARIA SESSION SUMMARY
Course Elapsed Days: 5
Plan Fractions Treated to Date: 4
Plan Prescribed Dose Per Fraction: 3 Gy
Plan Total Fractions Prescribed: 10
Plan Total Prescribed Dose: 30 Gy
Reference Point Dosage Given to Date: 12 Gy
Reference Point Session Dosage Given: 3 Gy
Session Number: 4

## 2022-11-25 LAB — POCT INR: POC INR: 2.5

## 2022-11-25 NOTE — Patient Instructions (Signed)
Description   Continue taking warfarin 1 tablet daily except for 1/2 a tablet on Mondays and Fridays. Recheck INR in 4 weeks.  Coumadin Clinic [phone]

## 2022-11-26 ENCOUNTER — Other Ambulatory Visit: Payer: Self-pay

## 2022-11-26 ENCOUNTER — Ambulatory Visit
Admission: RE | Admit: 2022-11-26 | Discharge: 2022-11-26 | Disposition: A | Discharge status: Home / Self Care | Payer: 59 | Source: Ambulatory Visit | Attending: Radiation Oncology | Admitting: Radiation Oncology

## 2022-11-26 DIAGNOSIS — D3910 Neoplasm of uncertain behavior of unspecified ovary: Secondary | ICD-10-CM | POA: Diagnosis not present

## 2022-11-26 LAB — RAD ONC ARIA SESSION SUMMARY
Course Elapsed Days: 6
Plan Fractions Treated to Date: 5
Plan Prescribed Dose Per Fraction: 3 Gy
Plan Total Fractions Prescribed: 10
Plan Total Prescribed Dose: 30 Gy
Reference Point Dosage Given to Date: 15 Gy
Reference Point Session Dosage Given: 3 Gy
Session Number: 5

## 2022-11-27 ENCOUNTER — Ambulatory Visit
Admission: RE | Admit: 2022-11-27 | Discharge: 2022-11-27 | Discharge status: Home / Self Care | Payer: 59 | Source: Ambulatory Visit | Attending: Radiation Oncology | Admitting: Radiation Oncology

## 2022-11-27 ENCOUNTER — Other Ambulatory Visit: Payer: Self-pay

## 2022-11-27 DIAGNOSIS — D3910 Neoplasm of uncertain behavior of unspecified ovary: Secondary | ICD-10-CM | POA: Diagnosis not present

## 2022-11-27 LAB — RAD ONC ARIA SESSION SUMMARY
Course Elapsed Days: 7
Plan Fractions Treated to Date: 6
Plan Prescribed Dose Per Fraction: 3 Gy
Plan Total Fractions Prescribed: 10
Plan Total Prescribed Dose: 30 Gy
Reference Point Dosage Given to Date: 18 Gy
Reference Point Session Dosage Given: 3 Gy
Session Number: 6

## 2022-11-28 ENCOUNTER — Ambulatory Visit
Admission: RE | Admit: 2022-11-28 | Discharge: 2022-11-28 | Disposition: A | Discharge status: Home / Self Care | Payer: 59 | Source: Ambulatory Visit | Attending: Radiation Oncology | Admitting: Radiation Oncology

## 2022-11-28 ENCOUNTER — Other Ambulatory Visit: Payer: Self-pay

## 2022-11-28 DIAGNOSIS — D3910 Neoplasm of uncertain behavior of unspecified ovary: Secondary | ICD-10-CM | POA: Diagnosis not present

## 2022-11-28 LAB — RAD ONC ARIA SESSION SUMMARY
Course Elapsed Days: 8
Plan Fractions Treated to Date: 7
Plan Prescribed Dose Per Fraction: 3 Gy
Plan Total Fractions Prescribed: 10
Plan Total Prescribed Dose: 30 Gy
Reference Point Dosage Given to Date: 21 Gy
Reference Point Session Dosage Given: 3 Gy
Session Number: 7

## 2022-11-29 ENCOUNTER — Ambulatory Visit
Admission: RE | Admit: 2022-11-29 | Discharge: 2022-11-29 | Disposition: A | Discharge status: Home / Self Care | Payer: 59 | Source: Ambulatory Visit | Attending: Radiation Oncology | Admitting: Radiation Oncology

## 2022-11-29 ENCOUNTER — Other Ambulatory Visit: Payer: Self-pay

## 2022-11-29 DIAGNOSIS — D3910 Neoplasm of uncertain behavior of unspecified ovary: Secondary | ICD-10-CM | POA: Diagnosis not present

## 2022-11-29 LAB — RAD ONC ARIA SESSION SUMMARY
Course Elapsed Days: 9
Plan Fractions Treated to Date: 8
Plan Prescribed Dose Per Fraction: 3 Gy
Plan Total Fractions Prescribed: 10
Plan Total Prescribed Dose: 30 Gy
Reference Point Dosage Given to Date: 24 Gy
Reference Point Session Dosage Given: 3 Gy
Session Number: 8

## 2022-12-02 ENCOUNTER — Other Ambulatory Visit: Payer: Self-pay

## 2022-12-02 ENCOUNTER — Other Ambulatory Visit: Payer: Self-pay | Admitting: Hematology & Oncology

## 2022-12-02 ENCOUNTER — Ambulatory Visit
Admission: RE | Admit: 2022-12-02 | Discharge: 2022-12-02 | Disposition: A | Discharge status: Home / Self Care | Payer: 59 | Source: Ambulatory Visit | Attending: Radiation Oncology | Admitting: Radiation Oncology

## 2022-12-02 DIAGNOSIS — D3910 Neoplasm of uncertain behavior of unspecified ovary: Secondary | ICD-10-CM

## 2022-12-02 LAB — RAD ONC ARIA SESSION SUMMARY
Course Elapsed Days: 12
Plan Fractions Treated to Date: 9
Plan Prescribed Dose Per Fraction: 3 Gy
Plan Total Fractions Prescribed: 10
Plan Total Prescribed Dose: 30 Gy
Reference Point Dosage Given to Date: 27 Gy
Reference Point Session Dosage Given: 3 Gy
Session Number: 9

## 2022-12-03 ENCOUNTER — Other Ambulatory Visit: Payer: Self-pay

## 2022-12-03 ENCOUNTER — Ambulatory Visit
Admission: RE | Admit: 2022-12-03 | Discharge: 2022-12-03 | Disposition: A | Discharge status: Home / Self Care | Payer: 59 | Source: Ambulatory Visit | Attending: Radiation Oncology | Admitting: Radiation Oncology

## 2022-12-03 DIAGNOSIS — D3910 Neoplasm of uncertain behavior of unspecified ovary: Secondary | ICD-10-CM | POA: Diagnosis not present

## 2022-12-03 LAB — RAD ONC ARIA SESSION SUMMARY
Course Elapsed Days: 13
Plan Fractions Treated to Date: 10
Plan Prescribed Dose Per Fraction: 3 Gy
Plan Total Fractions Prescribed: 10
Plan Total Prescribed Dose: 30 Gy
Reference Point Dosage Given to Date: 30 Gy
Reference Point Session Dosage Given: 3 Gy
Session Number: 10

## 2022-12-05 ENCOUNTER — Other Ambulatory Visit: Payer: Self-pay

## 2022-12-05 DIAGNOSIS — I480 Paroxysmal atrial fibrillation: Secondary | ICD-10-CM

## 2022-12-05 MED ORDER — WARFARIN SODIUM 7.5 MG PO TABS
ORAL_TABLET | ORAL | 0 refills | Status: DC
Start: 2022-12-05 — End: 2023-03-31

## 2022-12-05 NOTE — Telephone Encounter (Signed)
Prescription refill request received for warfarin Lov: 10/16/22 [name])  Next INR check: 12/23/22 Warfarin tablet strength: 7.5mg   Appropriate dose. Refill sent.

## 2022-12-06 ENCOUNTER — Other Ambulatory Visit: Payer: Self-pay | Admitting: Hematology & Oncology

## 2022-12-06 DIAGNOSIS — D3910 Neoplasm of uncertain behavior of unspecified ovary: Secondary | ICD-10-CM

## 2022-12-06 NOTE — Radiation Completion Notes (Signed)
Patient Name: [name], [name] MRN: [id number] Date of Birth: [birth year] Referring Physician: Arlan Organ, M.D. Date of Service: 2022-12-06 Radiation Oncologist: Arnette Schaumann, M.D. 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

## 2022-12-13 ENCOUNTER — Other Ambulatory Visit: Payer: Self-pay | Admitting: Hematology & Oncology

## 2022-12-13 DIAGNOSIS — F419 Anxiety disorder, unspecified: Secondary | ICD-10-CM

## 2022-12-23 ENCOUNTER — Ambulatory Visit: Payer: 59 | Attending: Cardiology

## 2022-12-23 DIAGNOSIS — Z5181 Encounter for therapeutic drug level monitoring: Secondary | ICD-10-CM | POA: Diagnosis not present

## 2022-12-23 DIAGNOSIS — I480 Paroxysmal atrial fibrillation: Secondary | ICD-10-CM | POA: Diagnosis not present

## 2022-12-23 DIAGNOSIS — Z952 Presence of prosthetic heart valve: Secondary | ICD-10-CM

## 2022-12-23 DIAGNOSIS — Z953 Presence of xenogenic heart valve: Secondary | ICD-10-CM | POA: Diagnosis not present

## 2022-12-23 LAB — POCT INR: INR: 3.4 — AB (ref 2.0–3.0)

## 2022-12-23 NOTE — Patient Instructions (Signed)
HOLD TODAY ONLY THEN Continue taking warfarin 1 tablet daily except for 1/2 a tablet on Mondays and Fridays. Recheck INR in 4 weeks.  Coumadin Clinic [phone]

## 2022-12-30 ENCOUNTER — Other Ambulatory Visit: Payer: Self-pay | Admitting: Hematology & Oncology

## 2022-12-30 DIAGNOSIS — D3910 Neoplasm of uncertain behavior of unspecified ovary: Secondary | ICD-10-CM

## 2022-12-31 ENCOUNTER — Encounter: Payer: Self-pay | Admitting: Hematology & Oncology

## 2022-12-31 ENCOUNTER — Ambulatory Visit (INDEPENDENT_AMBULATORY_CARE_PROVIDER_SITE_OTHER): Payer: 59

## 2022-12-31 ENCOUNTER — Other Ambulatory Visit: Payer: Self-pay | Admitting: Hematology & Oncology

## 2022-12-31 DIAGNOSIS — I255 Ischemic cardiomyopathy: Secondary | ICD-10-CM

## 2022-12-31 DIAGNOSIS — D3910 Neoplasm of uncertain behavior of unspecified ovary: Secondary | ICD-10-CM

## 2023-01-01 ENCOUNTER — Encounter: Payer: Self-pay | Admitting: Radiation Oncology

## 2023-01-01 ENCOUNTER — Telehealth (HOSPITAL_BASED_OUTPATIENT_CLINIC_OR_DEPARTMENT_OTHER): Payer: Self-pay

## 2023-01-01 ENCOUNTER — Encounter: Payer: Self-pay | Admitting: *Deleted

## 2023-01-01 LAB — CUP PACEART REMOTE DEVICE CHECK
Battery Remaining Longevity: 77 mo
Battery Remaining Percentage: 74 %
Battery Voltage: 2.99 V
Brady Statistic AP VP Percent: 1 %
Brady Statistic AP VS Percent: 2.7 %
Brady Statistic AS VP Percent: 1 %
Brady Statistic AS VS Percent: 96 %
Brady Statistic RA Percent Paced: 1 %
Brady Statistic RV Percent Paced: 1 %
Date Time Interrogation Session: 20240709021519
HighPow Impedance: 69 Ohm
Implantable Lead Connection Status: 753985
Implantable Lead Connection Status: 753985
Implantable Lead Implant Date: 20110503
Implantable Lead Implant Date: 20130815
Implantable Lead Location: 753859
Implantable Lead Location: 753860
Implantable Pulse Generator Implant Date: 20211012
Lead Channel Impedance Value: 260 Ohm
Lead Channel Impedance Value: 380 Ohm
Lead Channel Pacing Threshold Amplitude: 0.5 V
Lead Channel Pacing Threshold Amplitude: 2.125 V
Lead Channel Pacing Threshold Pulse Width: 0.5 ms
Lead Channel Pacing Threshold Pulse Width: 1 ms
Lead Channel Sensing Intrinsic Amplitude: 1.4 mV
Lead Channel Sensing Intrinsic Amplitude: 11.9 mV
Lead Channel Setting Pacing Amplitude: 2 V
Lead Channel Setting Pacing Amplitude: 2.375
Lead Channel Setting Pacing Pulse Width: 1 ms
Lead Channel Setting Sensing Sensitivity: 0.5 mV
Pulse Gen Serial Number: 111028151
Zone Setting Status: 755011

## 2023-01-01 NOTE — Progress Notes (Signed)
Radiation Oncology         [phone] ________________________________  Name: [name] MRN: [id number]  Date: 01/02/2023  DOB: [birth year]  Follow-Up Visit Note  CC: Josph Macho, MD  Josph Macho, MD  No diagnosis found.  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 She tolerated pelvic radiation well with no significant side effects. ***                              Allergies:  is allergic to augmentin [amoxicillin-pot clavulanate], prochlorperazine edisylate, and adhesive [tape].  Meds: Current Outpatient Medications  Medication Sig Dispense Refill   albuterol (VENTOLIN HFA) 108 (90 Base) MCG/ACT inhaler TAKE 2 PUFFS BY MOUTH EVERY 6 HOURS AS NEEDED FOR WHEEZE OR SHORTNESS OF BREATH (Patient not taking: Reported on 10/17/2022) 6.7 each 1   amoxicillin (AMOXIL) 500 MG tablet Take 2,000 mg by mouth See admin instructions. Take 2,000 mg one hour prior to dental visits. (Patient not taking: Reported on 10/17/2022)     aspirin EC 81 MG tablet Take 81 mg by mouth daily.     b complex vitamins capsule Take 1 capsule by mouth 2 (two) times daily.     carvedilol (COREG) 3.125 MG tablet TAKE ONE TABLET BY MOUTH TWICE DAILY WITH A MEAL 180 tablet 0   cholecalciferol (VITAMIN D3) 25 MCG (1000 UNIT) tablet Take 4,000 Units by mouth daily.     furosemide (LASIX) 20 MG tablet TAKE ONE TABLET BY MOUTH EVERY DAY 90 tablet 3   levothyroxine (SYNTHROID) 88 MCG tablet Take 1 tablet (88 mcg total) by mouth daily before breakfast. 30 tablet 1   lidocaine-prilocaine (EMLA) cream Apply 1 Application topically as needed. Apply to port per instructions 30 g 12   loperamide (IMODIUM A-D) 2 MG tablet Take 4 mg by mouth 3 (three) times daily as  needed for diarrhea or loose stools. (Patient not taking: Reported on 10/17/2022)     LORazepam (ATIVAN) 1 MG tablet Take 1 tablet (1 mg total) by mouth every 8 (eight) hours as needed for anxiety. 90 tablet 0   methocarbamol (ROBAXIN) 500 MG tablet TAKE ONE TABLET BY MOUTH EVERY 6 HOURS AS NEEDED FOR MUSCLE SPASMS 60 tablet 2   Potassium Chloride ER 20 MEQ TBCR Take 1 tablet (20 mEq total) by mouth daily. 90 tablet 3   sodium chloride 0.9 % SOLN 50 mL with pembrolizumab 100 MG/4ML SOLN 200 mg Inject 200 mg into the vein every 21 ( twenty-one) days.     spironolactone (ALDACTONE) 25 MG tablet Take 1 tablet (25 mg total) by mouth daily. 90 tablet 3   traMADol (ULTRAM) 50 MG tablet TAKE ONE TABLET BY MOUTH EVERY 6 HOURS AS NEEDED 90 tablet 0   Vitamin D, Ergocalciferol, (DRISDOL) 1.25 MG (50000 UNIT) CAPS capsule TAKE ONE CAPSULE BY MOUTH every 7 DAYS 12 capsule 0   warfarin (COUMADIN) 7.5 MG tablet Take 1 tablet by mouth daily except 1/2 tablet on Monday and Fridays or as directed by Anticoagulation Clinic. 90 tablet 0   zolpidem (AMBIEN) 10 MG tablet TAKE ONE TABLET BY MOUTH EVERY DAY 30 tablet 0   No current  facility-administered medications for this encounter.   Facility-Administered Medications Ordered in Other Encounters  Medication Dose Route Frequency Provider Last Rate Last Admin   sodium chloride flush (NS) 0.9 % injection 10 mL  10 mL Intravenous PRN Josph Macho, MD   10 mL at 09/14/18 1150    Physical Findings: The patient is in no acute distress. Patient is alert and oriented.  vitals were not taken for this visit. .  No significant changes. Lungs are clear to auscultation bilaterally. Heart has regular rate and rhythm. No palpable cervical, supraclavicular, or axillary adenopathy. Abdomen soft, non-tender, normal bowel sounds.   Lab Findings: Lab Results  Component Value Date   WBC 7.4 11/14/2022   HGB 11.1 (L) 11/14/2022   HCT 33.8 (L) 11/14/2022   MCV 92.6 11/14/2022    PLT 184 11/14/2022    Radiographic Findings: CUP PACEART REMOTE DEVICE CHECK  Result Date: 01/01/2023 Scheduled remote reviewed. Normal device function.  RV threshold 2.125V @ 1ms, consistent with trends Next remote 91 days. LA, CVRS   Impression:  {diagnosis}  The patient is recovering from the effects of radiation.  ***  Plan:  ***   *** minutes of total time was spent for this patient encounter, including preparation, face-to-face counseling with the patient and coordination of care, physical exam, and documentation of the encounter. ____________________________________  Billie Lade, PhD, MD   This document serves as a record of services personally performed by Antony Blackbird, MD. It was created on his behalf by Mickie Bail, a trained medical scribe. The creation of this record is based on the scribe's personal observations and the provider's statements to them. This document has been checked and approved by the attending provider.

## 2023-01-02 ENCOUNTER — Encounter: Payer: Self-pay | Admitting: Radiation Oncology

## 2023-01-02 ENCOUNTER — Ambulatory Visit
Admission: RE | Admit: 2023-01-02 | Discharge: 2023-01-02 | Disposition: A | Discharge status: Home / Self Care | Payer: 59 | Source: Ambulatory Visit | Attending: Radiation Oncology | Admitting: Radiation Oncology

## 2023-01-02 ENCOUNTER — Other Ambulatory Visit: Payer: Self-pay

## 2023-01-02 VITALS — BP 103/64 | HR 97 | Temp 97.3°F | Resp 18 | Ht 66.0 in | Wt 170.2 lb

## 2023-01-02 DIAGNOSIS — D3910 Neoplasm of uncertain behavior of unspecified ovary: Secondary | ICD-10-CM

## 2023-01-02 DIAGNOSIS — Z7982 Long term (current) use of aspirin: Secondary | ICD-10-CM | POA: Insufficient documentation

## 2023-01-02 DIAGNOSIS — Z79890 Hormone replacement therapy (postmenopausal): Secondary | ICD-10-CM | POA: Insufficient documentation

## 2023-01-02 DIAGNOSIS — Z7901 Long term (current) use of anticoagulants: Secondary | ICD-10-CM | POA: Insufficient documentation

## 2023-01-02 DIAGNOSIS — C786 Secondary malignant neoplasm of retroperitoneum and peritoneum: Secondary | ICD-10-CM | POA: Diagnosis present

## 2023-01-02 DIAGNOSIS — C561 Malignant neoplasm of right ovary: Secondary | ICD-10-CM | POA: Diagnosis not present

## 2023-01-02 DIAGNOSIS — Z79899 Other long term (current) drug therapy: Secondary | ICD-10-CM | POA: Diagnosis not present

## 2023-01-02 DIAGNOSIS — Y842 Radiological procedure and radiotherapy as the cause of abnormal reaction of the patient, or of later complication, without mention of misadventure at the time of the procedure: Secondary | ICD-10-CM | POA: Diagnosis not present

## 2023-01-02 HISTORY — DX: Personal history of irradiation: Z92.3

## 2023-01-02 NOTE — Progress Notes (Signed)
[name] is here today for follow up post radiation to the pelvis.  They completed their radiation on: 12/03/22  Does the patient complain of any of the following:  Pain: yes, Reports generalized pain, rating 8/10. Patient taking Robaxin and Tramadol as needed.  Abdominal bloating: Reports abdominal cramps.  Diarrhea/Constipation: No Nausea/Vomiting: Nausea  Blood in Urine or Stool: No Urinary Issues (dysuria/incomplete emptying/ incontinence/ increased frequency/urgency): No  Post radiation skin changes: No   Additional comments if applicable:  BP 103/64 (BP Location: Left Arm, Patient Position: Sitting)   Pulse 97   Temp (!) 97.3 F (36.3 C) (Temporal)   Resp 18   Ht 5\' 6"  (1.676 m)   Wt 170 lb 4 oz (77.2 kg)   SpO2 98%   BMI 27.48 kg/m

## 2023-01-06 ENCOUNTER — Other Ambulatory Visit: Payer: Self-pay | Admitting: Hematology & Oncology

## 2023-01-09 ENCOUNTER — Other Ambulatory Visit: Payer: Self-pay | Admitting: Physician Assistant

## 2023-01-09 ENCOUNTER — Other Ambulatory Visit: Payer: Self-pay | Admitting: Hematology & Oncology

## 2023-01-10 ENCOUNTER — Encounter: Payer: Self-pay | Admitting: Hematology & Oncology

## 2023-01-13 ENCOUNTER — Other Ambulatory Visit: Payer: Self-pay | Admitting: Hematology & Oncology

## 2023-01-13 DIAGNOSIS — F419 Anxiety disorder, unspecified: Secondary | ICD-10-CM

## 2023-01-22 ENCOUNTER — Ambulatory Visit: Payer: 59 | Attending: Cardiovascular Disease

## 2023-01-22 DIAGNOSIS — Z953 Presence of xenogenic heart valve: Secondary | ICD-10-CM | POA: Diagnosis not present

## 2023-01-22 DIAGNOSIS — I480 Paroxysmal atrial fibrillation: Secondary | ICD-10-CM | POA: Diagnosis not present

## 2023-01-22 DIAGNOSIS — Z952 Presence of prosthetic heart valve: Secondary | ICD-10-CM

## 2023-01-22 LAB — POCT INR: INR: 3.5 — AB (ref 2.0–3.0)

## 2023-01-22 NOTE — Patient Instructions (Signed)
Description   HOLD TODAY ONLY THEN START taking warfarin 1 tablet daily except for 1/2 a tablet on Mondays, Wednesdays and Fridays.  Recheck INR in 3 weeks.  Coumadin Clinic [phone]

## 2023-01-23 ENCOUNTER — Other Ambulatory Visit: Payer: Self-pay | Admitting: Hematology & Oncology

## 2023-01-23 DIAGNOSIS — D3910 Neoplasm of uncertain behavior of unspecified ovary: Secondary | ICD-10-CM

## 2023-01-23 NOTE — Progress Notes (Signed)
Remote ICD transmission.   

## 2023-01-30 ENCOUNTER — Other Ambulatory Visit: Payer: Self-pay | Admitting: Hematology & Oncology

## 2023-01-30 DIAGNOSIS — D3910 Neoplasm of uncertain behavior of unspecified ovary: Secondary | ICD-10-CM

## 2023-02-04 ENCOUNTER — Encounter (HOSPITAL_BASED_OUTPATIENT_CLINIC_OR_DEPARTMENT_OTHER): Payer: Self-pay

## 2023-02-04 ENCOUNTER — Ambulatory Visit (HOSPITAL_BASED_OUTPATIENT_CLINIC_OR_DEPARTMENT_OTHER)
Admission: RE | Admit: 2023-02-04 | Discharge: 2023-02-04 | Disposition: A | Discharge status: Home / Self Care | Payer: 59 | Source: Ambulatory Visit | Attending: Hematology & Oncology | Admitting: Hematology & Oncology

## 2023-02-04 ENCOUNTER — Inpatient Hospital Stay: Payer: 59 | Attending: Hematology & Oncology

## 2023-02-04 DIAGNOSIS — C569 Malignant neoplasm of unspecified ovary: Secondary | ICD-10-CM | POA: Insufficient documentation

## 2023-02-04 DIAGNOSIS — C786 Secondary malignant neoplasm of retroperitoneum and peritoneum: Secondary | ICD-10-CM | POA: Diagnosis present

## 2023-02-04 DIAGNOSIS — R911 Solitary pulmonary nodule: Secondary | ICD-10-CM | POA: Diagnosis not present

## 2023-02-04 DIAGNOSIS — I2510 Atherosclerotic heart disease of native coronary artery without angina pectoris: Secondary | ICD-10-CM | POA: Diagnosis not present

## 2023-02-04 DIAGNOSIS — K449 Diaphragmatic hernia without obstruction or gangrene: Secondary | ICD-10-CM | POA: Diagnosis not present

## 2023-02-04 DIAGNOSIS — D3910 Neoplasm of uncertain behavior of unspecified ovary: Secondary | ICD-10-CM | POA: Insufficient documentation

## 2023-02-04 DIAGNOSIS — R978 Other abnormal tumor markers: Secondary | ICD-10-CM | POA: Diagnosis present

## 2023-02-04 LAB — POCT I-STAT CREATININE: Creatinine, Ser: 0.9 mg/dL (ref 0.44–1.00)

## 2023-02-04 MED ORDER — SODIUM CHLORIDE 0.9% FLUSH
10.0000 mL | INTRAVENOUS | Status: DC | PRN
  Administered 2023-02-04: 10 mL via INTRAVENOUS

## 2023-02-04 MED ORDER — IOHEXOL 300 MG/ML  SOLN
100.0000 mL | Freq: Once | INTRAMUSCULAR | Status: AC | PRN
  Administered 2023-02-04: 100 mL via INTRAVENOUS

## 2023-02-04 MED ORDER — HEPARIN SOD (PORK) LOCK FLUSH 100 UNIT/ML IV SOLN
500.0000 [IU] | Freq: Once | INTRAVENOUS | Status: AC
  Administered 2023-02-04: 500 [IU] via INTRAVENOUS

## 2023-02-04 NOTE — Patient Instructions (Signed)

## 2023-02-07 ENCOUNTER — Other Ambulatory Visit: Payer: Self-pay | Admitting: Hematology & Oncology

## 2023-02-08 ENCOUNTER — Encounter: Payer: Self-pay | Admitting: Hematology & Oncology

## 2023-02-11 ENCOUNTER — Other Ambulatory Visit: Payer: Self-pay | Admitting: Hematology & Oncology

## 2023-02-11 DIAGNOSIS — F419 Anxiety disorder, unspecified: Secondary | ICD-10-CM

## 2023-02-12 ENCOUNTER — Ambulatory Visit: Payer: 59

## 2023-02-12 DIAGNOSIS — Z953 Presence of xenogenic heart valve: Secondary | ICD-10-CM | POA: Diagnosis not present

## 2023-02-12 DIAGNOSIS — I480 Paroxysmal atrial fibrillation: Secondary | ICD-10-CM

## 2023-02-12 DIAGNOSIS — Z952 Presence of prosthetic heart valve: Secondary | ICD-10-CM

## 2023-02-12 LAB — POCT INR: INR: 2.4 (ref 2.0–3.0)

## 2023-02-12 NOTE — Patient Instructions (Signed)
Description   Continue taking warfarin 1 tablet daily except for 1/2 a tablet on Mondays, Wednesdays and Fridays.  Recheck INR in 4 weeks.  Coumadin Clinic [phone]

## 2023-02-17 ENCOUNTER — Other Ambulatory Visit: Payer: Self-pay | Admitting: Hematology & Oncology

## 2023-02-17 DIAGNOSIS — D3910 Neoplasm of uncertain behavior of unspecified ovary: Secondary | ICD-10-CM

## 2023-02-20 ENCOUNTER — Other Ambulatory Visit: Payer: Self-pay | Admitting: Hematology & Oncology

## 2023-02-20 DIAGNOSIS — D500 Iron deficiency anemia secondary to blood loss (chronic): Secondary | ICD-10-CM

## 2023-02-20 DIAGNOSIS — D3910 Neoplasm of uncertain behavior of unspecified ovary: Secondary | ICD-10-CM

## 2023-02-25 ENCOUNTER — Other Ambulatory Visit: Payer: Self-pay | Admitting: Hematology & Oncology

## 2023-02-28 ENCOUNTER — Inpatient Hospital Stay: Payer: 59

## 2023-02-28 ENCOUNTER — Other Ambulatory Visit: Payer: Self-pay | Admitting: Hematology & Oncology

## 2023-02-28 ENCOUNTER — Inpatient Hospital Stay: Payer: 59 | Admitting: Hematology & Oncology

## 2023-02-28 DIAGNOSIS — D3910 Neoplasm of uncertain behavior of unspecified ovary: Secondary | ICD-10-CM

## 2023-03-06 ENCOUNTER — Telehealth: Payer: Self-pay | Admitting: Hematology & Oncology

## 2023-03-06 NOTE — Telephone Encounter (Signed)
Called to remind pt of upcoming appointment on 9/13. LVM.

## 2023-03-07 ENCOUNTER — Encounter: Payer: Self-pay | Admitting: *Deleted

## 2023-03-07 ENCOUNTER — Encounter: Payer: Self-pay | Admitting: Hematology & Oncology

## 2023-03-07 ENCOUNTER — Inpatient Hospital Stay (HOSPITAL_BASED_OUTPATIENT_CLINIC_OR_DEPARTMENT_OTHER): Payer: 59 | Admitting: Hematology & Oncology

## 2023-03-07 ENCOUNTER — Other Ambulatory Visit: Payer: Self-pay | Admitting: *Deleted

## 2023-03-07 ENCOUNTER — Inpatient Hospital Stay: Payer: 59 | Attending: Hematology & Oncology

## 2023-03-07 ENCOUNTER — Inpatient Hospital Stay: Payer: 59

## 2023-03-07 ENCOUNTER — Other Ambulatory Visit: Payer: Self-pay

## 2023-03-07 VITALS — BP 83/54 | HR 70 | Temp 97.8°F | Resp 18 | Ht 66.0 in | Wt 167.0 lb

## 2023-03-07 VITALS — BP 101/56 | HR 67 | Resp 19

## 2023-03-07 DIAGNOSIS — K909 Intestinal malabsorption, unspecified: Secondary | ICD-10-CM | POA: Diagnosis not present

## 2023-03-07 DIAGNOSIS — D3910 Neoplasm of uncertain behavior of unspecified ovary: Secondary | ICD-10-CM

## 2023-03-07 DIAGNOSIS — Z7901 Long term (current) use of anticoagulants: Secondary | ICD-10-CM | POA: Insufficient documentation

## 2023-03-07 DIAGNOSIS — D500 Iron deficiency anemia secondary to blood loss (chronic): Secondary | ICD-10-CM

## 2023-03-07 DIAGNOSIS — Z952 Presence of prosthetic heart valve: Secondary | ICD-10-CM | POA: Diagnosis not present

## 2023-03-07 DIAGNOSIS — C786 Secondary malignant neoplasm of retroperitoneum and peritoneum: Secondary | ICD-10-CM | POA: Diagnosis present

## 2023-03-07 DIAGNOSIS — C569 Malignant neoplasm of unspecified ovary: Secondary | ICD-10-CM | POA: Insufficient documentation

## 2023-03-07 DIAGNOSIS — Z79811 Long term (current) use of aromatase inhibitors: Secondary | ICD-10-CM | POA: Insufficient documentation

## 2023-03-07 DIAGNOSIS — D508 Other iron deficiency anemias: Secondary | ICD-10-CM | POA: Diagnosis not present

## 2023-03-07 DIAGNOSIS — Z5112 Encounter for antineoplastic immunotherapy: Secondary | ICD-10-CM | POA: Insufficient documentation

## 2023-03-07 DIAGNOSIS — E039 Hypothyroidism, unspecified: Secondary | ICD-10-CM

## 2023-03-07 LAB — CBC WITH DIFFERENTIAL (CANCER CENTER ONLY)
Abs Immature Granulocytes: 0.01 10*3/uL (ref 0.00–0.07)
Basophils Absolute: 0 10*3/uL (ref 0.0–0.1)
Basophils Relative: 0 %
Eosinophils Absolute: 0.1 10*3/uL (ref 0.0–0.5)
Eosinophils Relative: 1 %
HCT: 33.9 % — ABNORMAL LOW (ref 36.0–46.0)
Hemoglobin: 11.3 g/dL — ABNORMAL LOW (ref 12.0–15.0)
Immature Granulocytes: 0 %
Lymphocytes Relative: 10 %
Lymphs Abs: 0.7 10*3/uL (ref 0.7–4.0)
MCH: 30.6 pg (ref 26.0–34.0)
MCHC: 33.3 g/dL (ref 30.0–36.0)
MCV: 91.9 fL (ref 80.0–100.0)
Monocytes Absolute: 0.7 10*3/uL (ref 0.1–1.0)
Monocytes Relative: 11 %
Neutro Abs: 5.2 10*3/uL (ref 1.7–7.7)
Neutrophils Relative %: 78 %
Platelet Count: 148 10*3/uL — ABNORMAL LOW (ref 150–400)
RBC: 3.69 MIL/uL — ABNORMAL LOW (ref 3.87–5.11)
RDW: 13.2 % (ref 11.5–15.5)
WBC Count: 6.7 10*3/uL (ref 4.0–10.5)
nRBC: 0 % (ref 0.0–0.2)

## 2023-03-07 LAB — IRON AND IRON BINDING CAPACITY (CC-WL,HP ONLY)
Iron: 79 ug/dL (ref 28–170)
Saturation Ratios: 22 % (ref 10.4–31.8)
TIBC: 358 ug/dL (ref 250–450)
UIBC: 279 ug/dL (ref 148–442)

## 2023-03-07 LAB — CMP (CANCER CENTER ONLY)
ALT: 7 U/L (ref 0–44)
AST: 12 U/L — ABNORMAL LOW (ref 15–41)
Albumin: 3.7 g/dL (ref 3.5–5.0)
Alkaline Phosphatase: 37 U/L — ABNORMAL LOW (ref 38–126)
Anion gap: 8 (ref 5–15)
BUN: 8 mg/dL (ref 8–23)
CO2: 30 mmol/L (ref 22–32)
Calcium: 8.7 mg/dL — ABNORMAL LOW (ref 8.9–10.3)
Chloride: 97 mmol/L — ABNORMAL LOW (ref 98–111)
Creatinine: 1.16 mg/dL — ABNORMAL HIGH (ref 0.44–1.00)
GFR, Estimated: 53 mL/min — ABNORMAL LOW (ref >60)
Glucose, Bld: 77 mg/dL (ref 70–99)
Potassium: 3.2 mmol/L — ABNORMAL LOW (ref 3.5–5.1)
Sodium: 135 mmol/L (ref 135–145)
Total Bilirubin: 0.4 mg/dL (ref 0.3–1.2)
Total Protein: 5.8 g/dL — ABNORMAL LOW (ref 6.5–8.1)

## 2023-03-07 LAB — FERRITIN: Ferritin: 19 ng/mL (ref 11–307)

## 2023-03-07 LAB — LACTATE DEHYDROGENASE: LDH: 191 U/L (ref 98–192)

## 2023-03-07 MED ORDER — HEPARIN SOD (PORK) LOCK FLUSH 100 UNIT/ML IV SOLN
500.0000 [IU] | Freq: Once | INTRAVENOUS | Status: AC
  Administered 2023-03-07: 500 [IU] via INTRAVENOUS

## 2023-03-07 MED ORDER — SODIUM CHLORIDE 0.9 % IV SOLN: Freq: Once | INTRAVENOUS | Status: AC

## 2023-03-07 MED ORDER — ATROPINE SULFATE 1 MG/ML IV SOLN
0.5000 mg | Freq: Once | INTRAVENOUS | Status: AC
  Administered 2023-03-07: 0.5 mg via INTRAVENOUS
  Filled 2023-03-07: qty 1

## 2023-03-07 MED ORDER — SODIUM CHLORIDE 0.9 % IV SOLN
200.0000 mg | Freq: Once | INTRAVENOUS | Status: AC
  Administered 2023-03-07: 200 mg via INTRAVENOUS
  Filled 2023-03-07: qty 8

## 2023-03-07 MED ORDER — SODIUM CHLORIDE 0.9 % IV SOLN: Freq: Once | INTRAVENOUS | Status: DC

## 2023-03-07 MED ORDER — SODIUM CHLORIDE 0.9% FLUSH
10.0000 mL | Freq: Once | INTRAVENOUS | Status: AC
  Administered 2023-03-07: 10 mL via INTRAVENOUS

## 2023-03-07 NOTE — Patient Instructions (Signed)

## 2023-03-07 NOTE — Addendum Note (Signed)
Addended by: Cooper Render on: 03/07/2023 03:15 PM   Modules accepted: Orders

## 2023-03-07 NOTE — Progress Notes (Signed)
Hematology and Oncology Follow Up Visit  ADELI GONNERING [id number] 1960/08/16 62 y.o. 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 Petrash is here today for follow-up.  Is been little bit since we last saw her.  We did do a CT scan on her.  This was done on 02/04/2023.  Therapy, the CT scan really did not show that she had progressive disease.  There is some areas that were limited better.  There were some areas that may have been a little bit more growth.  Of note, we do follow her Inhibin B.  The last value that we had was 290.  She is doing okay.  She feels okay.  She has occasional abdominal spasms.  She has had no problems with fever.  There is no cough or shortness of breath.  She does have mechanical heart valve.  She is on Coumadin.  Her last INR was 2.4.  She has had no obvious change in bowel or bladder habits.  She has had no diarrhea.  She has 2 grandchildren.  She is very happy to have them and wants to be very involved with their care.  She has had no issues with bleeding.  There is no rashes.  She has had no leg swelling.  When I saw her, her TSH was coming down.  It was 39.4.  Will see what her TSH is today.  She is still working.  She is quite busy with her job.  Overall, I would say that her performance status is probably ECOG 1.     Medications:  Allergies as of 03/07/2023       Reactions   Augmentin [amoxicillin-pot Clavulanate] Other (See Comments)   Muscle Aches   Prochlorperazine Edisylate Other (See Comments)   Nervous/ flutter/ shakes   Adhesive [tape] Hives   Redness/hives from adhesive tape( tolerates latex gloves); tolerates paper tape         Medication List        Accurate as of March 07, 2023  2:12 PM. If you have any questions, ask your nurse or doctor.          albuterol 108 (90 Base) MCG/ACT inhaler Commonly known as: VENTOLIN HFA TAKE 2 PUFFS BY MOUTH EVERY 6 HOURS AS NEEDED FOR WHEEZE OR SHORTNESS OF BREATH   amoxicillin 500 MG tablet Commonly known as: AMOXIL Take 2,000 mg by mouth See admin instructions. Take 2,000 mg one hour prior to dental visits.   aspirin EC 81 MG tablet Take 81 mg by mouth daily.   b complex vitamins capsule Take 1 capsule by mouth 2 (two) times daily.   carvedilol 3.125 MG tablet Commonly known as: COREG TAKE ONE TABLET BY MOUTH TWICE DAILY WITH A MEAL   cholecalciferol 25 MCG (1000 UNIT) tablet Commonly known as: VITAMIN D3 Take 4,000 Units by mouth daily.   furosemide 20 MG tablet Commonly known as: LASIX TAKE ONE TABLET BY MOUTH EVERY DAY   levothyroxine 88 MCG tablet Commonly known as: SYNTHROID Take 1 tablet (88 mcg total) by mouth daily before breakfast.   lidocaine-prilocaine cream Commonly known as: EMLA Apply 1 Application topically as needed. Apply  to port per instructions   loperamide 2 MG tablet Commonly known as: IMODIUM A-D Take 4 mg by mouth 3 (three) times daily as needed for diarrhea or loose stools.   LORazepam 1 MG tablet Commonly known as: ATIVAN Take 1 tablet (1 mg total) by mouth every 8 (eight) hours as needed for anxiety.   methocarbamol 500 MG tablet Commonly known as: ROBAXIN TAKE ONE TABLET BY MOUTH EVERY 6 HOURS AS NEEDED FOR MUSCLE SPASMS   Potassium Chloride ER 20 MEQ Tbcr Take 1 tablet (20 mEq total) by mouth daily.   sodium chloride 0.9 % SOLN 50 mL with pembrolizumab 100 MG/4ML SOLN 200 mg Inject 200 mg into the vein every 21 ( twenty-one) days.   spironolactone 25 MG tablet Commonly known as: ALDACTONE Take 1 tablet (25 mg total) by mouth daily.   traMADol 50 MG tablet Commonly known as: ULTRAM TAKE ONE TABLET BY  MOUTH EVERY 6 HOURS AS NEEDED   Vitamin D (Ergocalciferol) 1.25 MG (50000 UNIT) Caps capsule Commonly known as: DRISDOL TAKE ONE CAPSULE BY MOUTH every 7 DAYS   warfarin 7.5 MG tablet Commonly known as: COUMADIN Take as directed by the anticoagulation clinic. If you are unsure how to take this medication, talk to your nurse or doctor. Original instructions: Take 1 tablet by mouth daily except 1/2 tablet on Monday and Fridays or as directed by Anticoagulation Clinic.   zolpidem 10 MG tablet Commonly known as: AMBIEN TAKE ONE TABLET BY MOUTH EVERY DAY        Allergies:  Allergies  Allergen Reactions   Augmentin [Amoxicillin-Pot Clavulanate] Other (See Comments)    Muscle Aches   Prochlorperazine Edisylate Other (See Comments)    Nervous/ flutter/ shakes   Adhesive [Tape] Hives    Redness/hives from adhesive tape( tolerates latex gloves); tolerates paper tape    Past Medical History, Surgical history, Social history, and Family History were reviewed and updated.  Review of Systems:  Review of Systems  Constitutional: Negative.   HENT: Negative.    Eyes: Negative.   Respiratory: Negative.    Cardiovascular: Negative.   Gastrointestinal: Negative.   Genitourinary: Negative.   Musculoskeletal: Negative.   Skin: Negative.   Neurological: Negative.   Endo/Heme/Allergies: Negative.   Psychiatric/Behavioral: Negative.        Exam:  height is 5\' 6"  (1.676 m) and weight is 167 lb (75.8 kg). Her oral temperature is 97.8 F (36.6 C). Her blood pressure is 83/54 (abnormal) and her pulse is 70. Her respiration is 18 and oxygen saturation is 100%.   Wt Readings from Last 3 Encounters:  03/07/23 167 lb (75.8 kg)  01/02/23 170 lb 4 oz (77.2 kg)  11/14/22 171 lb (77.6 kg)    Physical Exam Vitals reviewed.  HENT:     Head: Normocephalic and atraumatic.  Eyes:     Pupils: Pupils are equal, round, and reactive to light.  Cardiovascular:     Rate and Rhythm: Normal rate and  regular rhythm.     Heart sounds: Normal heart sounds.  Pulmonary:     Effort: Pulmonary effort is normal.     Breath sounds: Normal breath sounds.  Abdominal:     General: Bowel sounds are normal.     Palpations: Abdomen is soft.  Musculoskeletal:        General: No tenderness or deformity. Normal range of motion.     Cervical back: Normal range of motion.  Lymphadenopathy:     Cervical: No cervical adenopathy.  Skin:    General: Skin is warm and dry.     Findings: No erythema or rash.  Neurological:     Mental Status: She is alert and oriented to person, place, and time.  Psychiatric:        Behavior: Behavior normal.        Thought Content: Thought content normal.        Judgment: Judgment normal.      Lab Results  Component Value Date   WBC 6.7 03/07/2023   HGB 11.3 (L) 03/07/2023   HCT 33.9 (L) 03/07/2023   MCV 91.9 03/07/2023   PLT 148 (L) 03/07/2023   Lab Results  Component Value Date   FERRITIN 25 11/14/2022   IRON 51 11/14/2022   TIBC 329 11/14/2022   UIBC 278 11/14/2022   IRONPCTSAT 16 11/14/2022   Lab Results  Component Value Date   RETICCTPCT 1.9 11/14/2022   RBC 3.69 (L) 03/07/2023   No results found for: "KPAFRELGTCHN", "LAMBDASER", "KAPLAMBRATIO" No results found for: "IGGSERUM", "IGA", "IGMSERUM" No results found for: "TOTALPROTELP", "ALBUMINELP", "A1GS", "A2GS", "BETS", "BETA2SER", "GAMS", "MSPIKE", "SPEI"   Chemistry      Component Value Date/Time   NA 135 03/07/2023 1210   NA 139 08/14/2020 1428   NA 140 03/15/2016 0953   K 3.2 (L) 03/07/2023 1210   K 3.9 03/15/2016 0953   CL 97 (L) 03/07/2023 1210   CL 102 12/13/2015 0803   CO2 30 03/07/2023 1210   CO2 19 (L) 03/15/2016 0953   BUN 8 03/07/2023 1210   BUN 16 08/14/2020 1428   BUN 15.9 03/15/2016 0953   CREATININE 1.16 (H) 03/07/2023 1210   CREATININE 1.4 (H) 03/15/2016 0953      Component Value Date/Time   CALCIUM 8.7 (L) 03/07/2023 1210   CALCIUM 9.3 03/15/2016 0953   ALKPHOS  37 (L) 03/07/2023 1210   ALKPHOS 55 03/15/2016 0953   AST 12 (L) 03/07/2023 1210   AST 17 03/15/2016 0953   ALT 7 03/07/2023 1210   ALT 23 03/15/2016 0953   BILITOT 0.4 03/07/2023 1210   BILITOT 0.38 03/15/2016 0953       Impression and Plan: Ms. Jordan is a very pleasant 62 yo caucasian female with recurrent granulosa cell tumor.  We now have her on immunotherapy.  We are trying to avoid systemic chemotherapy if possible.  Hopefully, we will continue to see that her disease is stable.  Again, the Inhibin B is certainly a marker that we can use.  We will go ahead with her on the seventh treatment today.  I will have her come by to see [place] in another 3 weeks.  Again, I think other options that we have for her might be to use is the FGFR2 inhibitor - erdatifinib.   Josph Macho, MD 9/13/20242:12 PM

## 2023-03-07 NOTE — Patient Instructions (Signed)

## 2023-03-07 NOTE — Addendum Note (Signed)
Addended by: Reesa Chew on: 03/07/2023 01:37 PM   Modules accepted: Orders

## 2023-03-11 LAB — INHIBIN B: Inhibin B: 206.1 pg/mL — ABNORMAL HIGH (ref 0.0–16.9)

## 2023-03-12 ENCOUNTER — Other Ambulatory Visit: Payer: Self-pay | Admitting: Hematology & Oncology

## 2023-03-12 ENCOUNTER — Ambulatory Visit: Payer: 59 | Attending: Cardiovascular Disease

## 2023-03-12 DIAGNOSIS — I480 Paroxysmal atrial fibrillation: Secondary | ICD-10-CM

## 2023-03-12 DIAGNOSIS — Z952 Presence of prosthetic heart valve: Secondary | ICD-10-CM

## 2023-03-12 DIAGNOSIS — Z953 Presence of xenogenic heart valve: Secondary | ICD-10-CM

## 2023-03-12 DIAGNOSIS — D3910 Neoplasm of uncertain behavior of unspecified ovary: Secondary | ICD-10-CM

## 2023-03-12 LAB — POCT INR: INR: 2.7 (ref 2.0–3.0)

## 2023-03-12 NOTE — Patient Instructions (Signed)
Description   Continue taking warfarin 1 tablet daily except for 1/2 a tablet on Mondays, Wednesdays and Fridays.  Recheck INR in 5 weeks.  Coumadin Clinic [phone]

## 2023-03-13 ENCOUNTER — Other Ambulatory Visit: Payer: Self-pay | Admitting: *Deleted

## 2023-03-13 DIAGNOSIS — F419 Anxiety disorder, unspecified: Secondary | ICD-10-CM

## 2023-03-13 MED ORDER — LORAZEPAM 1 MG PO TABS: 1.0000 mg | ORAL_TABLET | Freq: Three times a day (TID) | ORAL | 0 refills | Status: DC | PRN

## 2023-03-28 ENCOUNTER — Other Ambulatory Visit: Payer: Self-pay

## 2023-03-28 ENCOUNTER — Inpatient Hospital Stay: Payer: 59

## 2023-03-28 ENCOUNTER — Inpatient Hospital Stay: Payer: 59 | Attending: Hematology & Oncology

## 2023-03-28 ENCOUNTER — Inpatient Hospital Stay (HOSPITAL_BASED_OUTPATIENT_CLINIC_OR_DEPARTMENT_OTHER): Payer: 59 | Admitting: Medical Oncology

## 2023-03-28 ENCOUNTER — Encounter: Payer: Self-pay | Admitting: Medical Oncology

## 2023-03-28 VITALS — BP 98/63 | HR 77 | Temp 98.4°F | Resp 19 | Ht 66.0 in | Wt 164.0 lb

## 2023-03-28 VITALS — BP 105/57 | HR 69 | Resp 18

## 2023-03-28 DIAGNOSIS — Z5112 Encounter for antineoplastic immunotherapy: Secondary | ICD-10-CM | POA: Insufficient documentation

## 2023-03-28 DIAGNOSIS — R946 Abnormal results of thyroid function studies: Secondary | ICD-10-CM | POA: Diagnosis not present

## 2023-03-28 DIAGNOSIS — D500 Iron deficiency anemia secondary to blood loss (chronic): Secondary | ICD-10-CM

## 2023-03-28 DIAGNOSIS — E039 Hypothyroidism, unspecified: Secondary | ICD-10-CM | POA: Diagnosis not present

## 2023-03-28 DIAGNOSIS — Z7901 Long term (current) use of anticoagulants: Secondary | ICD-10-CM | POA: Insufficient documentation

## 2023-03-28 DIAGNOSIS — D3910 Neoplasm of uncertain behavior of unspecified ovary: Secondary | ICD-10-CM

## 2023-03-28 DIAGNOSIS — D508 Other iron deficiency anemias: Secondary | ICD-10-CM | POA: Insufficient documentation

## 2023-03-28 DIAGNOSIS — C786 Secondary malignant neoplasm of retroperitoneum and peritoneum: Secondary | ICD-10-CM | POA: Diagnosis present

## 2023-03-28 DIAGNOSIS — K909 Intestinal malabsorption, unspecified: Secondary | ICD-10-CM | POA: Diagnosis not present

## 2023-03-28 DIAGNOSIS — Z79890 Hormone replacement therapy (postmenopausal): Secondary | ICD-10-CM | POA: Diagnosis not present

## 2023-03-28 DIAGNOSIS — Z79620 Long term (current) use of immunosuppressive biologic: Secondary | ICD-10-CM | POA: Diagnosis not present

## 2023-03-28 DIAGNOSIS — C569 Malignant neoplasm of unspecified ovary: Secondary | ICD-10-CM | POA: Insufficient documentation

## 2023-03-28 DIAGNOSIS — Z952 Presence of prosthetic heart valve: Secondary | ICD-10-CM | POA: Diagnosis not present

## 2023-03-28 LAB — CBC WITH DIFFERENTIAL (CANCER CENTER ONLY)
Abs Immature Granulocytes: 0.04 10*3/uL (ref 0.00–0.07)
Basophils Absolute: 0 10*3/uL (ref 0.0–0.1)
Basophils Relative: 1 %
Eosinophils Absolute: 0.1 10*3/uL (ref 0.0–0.5)
Eosinophils Relative: 1 %
HCT: 34.6 % — ABNORMAL LOW (ref 36.0–46.0)
Hemoglobin: 11.7 g/dL — ABNORMAL LOW (ref 12.0–15.0)
Immature Granulocytes: 1 %
Lymphocytes Relative: 7 %
Lymphs Abs: 0.6 10*3/uL — ABNORMAL LOW (ref 0.7–4.0)
MCH: 31 pg (ref 26.0–34.0)
MCHC: 33.8 g/dL (ref 30.0–36.0)
MCV: 91.5 fL (ref 80.0–100.0)
Monocytes Absolute: 0.7 10*3/uL (ref 0.1–1.0)
Monocytes Relative: 9 %
Neutro Abs: 6.7 10*3/uL (ref 1.7–7.7)
Neutrophils Relative %: 81 %
Platelet Count: 181 10*3/uL (ref 150–400)
RBC: 3.78 MIL/uL — ABNORMAL LOW (ref 3.87–5.11)
RDW: 13.2 % (ref 11.5–15.5)
WBC Count: 8.1 10*3/uL (ref 4.0–10.5)
nRBC: 0 % (ref 0.0–0.2)

## 2023-03-28 LAB — CMP (CANCER CENTER ONLY)
ALT: 8 U/L (ref 0–44)
AST: 12 U/L — ABNORMAL LOW (ref 15–41)
Albumin: 3.8 g/dL (ref 3.5–5.0)
Alkaline Phosphatase: 38 U/L (ref 38–126)
Anion gap: 8 (ref 5–15)
BUN: 13 mg/dL (ref 8–23)
CO2: 27 mmol/L (ref 22–32)
Calcium: 8.8 mg/dL — ABNORMAL LOW (ref 8.9–10.3)
Chloride: 102 mmol/L (ref 98–111)
Creatinine: 1.13 mg/dL — ABNORMAL HIGH (ref 0.44–1.00)
GFR, Estimated: 55 mL/min — ABNORMAL LOW (ref >60)
Glucose, Bld: 95 mg/dL (ref 70–99)
Potassium: 3.5 mmol/L (ref 3.5–5.1)
Sodium: 137 mmol/L (ref 135–145)
Total Bilirubin: 0.4 mg/dL (ref 0.3–1.2)
Total Protein: 6.3 g/dL — ABNORMAL LOW (ref 6.5–8.1)

## 2023-03-28 LAB — TSH: TSH: 3.211 u[IU]/mL (ref 0.350–4.500)

## 2023-03-28 LAB — IRON AND IRON BINDING CAPACITY (CC-WL,HP ONLY)
Iron: 72 ug/dL (ref 28–170)
Saturation Ratios: 19 % (ref 10.4–31.8)
TIBC: 382 ug/dL (ref 250–450)
UIBC: 310 ug/dL (ref 148–442)

## 2023-03-28 LAB — FERRITIN: Ferritin: 15 ng/mL (ref 11–307)

## 2023-03-28 MED ORDER — HEPARIN SOD (PORK) LOCK FLUSH 100 UNIT/ML IV SOLN
500.0000 [IU] | Freq: Once | INTRAVENOUS | Status: AC | PRN
  Administered 2023-03-28: 500 [IU]

## 2023-03-28 MED ORDER — SODIUM CHLORIDE 0.9 % IV SOLN
200.0000 mg | Freq: Once | INTRAVENOUS | Status: AC
  Administered 2023-03-28: 200 mg via INTRAVENOUS
  Filled 2023-03-28: qty 8

## 2023-03-28 MED ORDER — SODIUM CHLORIDE 0.9% FLUSH
10.0000 mL | INTRAVENOUS | Status: DC | PRN
  Administered 2023-03-28: 10 mL

## 2023-03-28 MED ORDER — SODIUM CHLORIDE 0.9 % IV SOLN: Freq: Once | INTRAVENOUS | Status: AC

## 2023-03-28 NOTE — Patient Instructions (Signed)
[facility]  Discharge Instructions: Thank you for choosing [facility] to provide your oncology and hematology care.   If you have a lab appointment with the Cancer Center, please go directly to the Cancer Center and check in at the registration area.  Wear comfortable clothing and clothing appropriate for easy access to any Portacath or PICC line.   We strive to give you quality time with your provider. You may need to reschedule your appointment if you arrive late (15 or more minutes).  Arriving late affects you and other patients whose appointments are after yours.  Also, if you miss three or more appointments without notifying the office, you may be dismissed from the clinic at the provider's discretion.      For prescription refill requests, have your pharmacy contact our office and allow 72 hours for refills to be completed.    Today you received the following chemotherapy and/or immunotherapy agents keytruda      To help prevent nausea and vomiting after your treatment, we encourage you to take your nausea medication as directed.  BELOW ARE SYMPTOMS THAT SHOULD BE REPORTED IMMEDIATELY: *FEVER GREATER THAN 100.4 F (38 C) OR HIGHER *CHILLS OR SWEATING *NAUSEA AND VOMITING THAT IS NOT CONTROLLED WITH YOUR NAUSEA MEDICATION *UNUSUAL SHORTNESS OF BREATH *UNUSUAL BRUISING OR BLEEDING *URINARY PROBLEMS (pain or burning when urinating, or frequent urination) *BOWEL PROBLEMS (unusual diarrhea, constipation, pain near the anus) TENDERNESS IN MOUTH AND THROAT WITH OR WITHOUT PRESENCE OF ULCERS (sore throat, sores in mouth, or a toothache) UNUSUAL RASH, SWELLING OR PAIN  UNUSUAL VAGINAL DISCHARGE OR ITCHING   Items with * indicate a potential emergency and should be followed up as soon as possible or go to the Emergency Department if any problems should occur.  Please show the CHEMOTHERAPY ALERT CARD or IMMUNOTHERAPY ALERT CARD at  check-in to the Emergency Department and triage nurse. Should you have questions after your visit or need to cancel or reschedule your appointment, please contact [facility]  [phone] and follow the prompts.  Office hours are 8:00 a.m. to 4:30 p.m. Monday - Friday. Please note that voicemails left after 4:00 p.m. may not be returned until the following business day.  We are closed weekends and major holidays. You have access to a nurse at all times for urgent questions. Please call the main number to the clinic [phone] and follow the prompts.  For any non-urgent questions, you may also contact your provider using MyChart. We now offer e-Visits for anyone 18 and older to request care online for non-urgent symptoms. For details visit [email or web address].   Also download the MyChart app! Go to the app store, search "MyChart", open the app, select [facility], and log in with your MyChart username and password.   

## 2023-03-28 NOTE — Progress Notes (Signed)
Hematology and Oncology Follow Up Visit  [name] [id number] Nov 10, 1960 62 y.o. 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   She reports that she is doing well and feeling well. She has occasional abdominal spasms. She has found that using a heating pad has been really helpful when they occur.   Last CT was was on 02/04/2023 which did not show significant progression.   Of note, we do follow her Inhibin B.  The last value that we had was down to 206.1  She has had no problems with fever.  There is no cough or shortness of breath.  She does have mechanical heart valve.  She is on Coumadin.  Her last INR 2 weeks ago was 2.7 - this is managed by her cardiologist.   She has had no obvious change in bowel or bladder habits.  She has had no diarrhea.  She has had no issues with bleeding.  There is no rashes.  She has had no leg swelling.  Has issues with elevated TSH levels secondary to her immunotherapy. Her last TSH 4 months ago was 39.454. She is currently on synthroid 88 mcg once daily. This is managed by our office.   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  She is quite busy with her job.  Overall, I would say that her performance status is probably ECOG 1. Wt Readings from Last 3 Encounters:  03/28/23 164 lb (74.4 kg)  03/07/23 167 lb (75.8 kg)  01/02/23 170 lb 4 oz (77.2 kg)     Medications:  Allergies as of 03/28/2023       Reactions   Augmentin [amoxicillin-pot Clavulanate] Other (See Comments)   Muscle Aches    Prochlorperazine Edisylate Other (See Comments)   Nervous/ flutter/ shakes   Adhesive [tape] Hives   Redness/hives from adhesive tape( tolerates latex gloves); tolerates paper tape        Medication List        Accurate as of March 28, 2023 11:33 AM. If you have any questions, ask your nurse or doctor.          albuterol 108 (90 Base) MCG/ACT inhaler Commonly known as: VENTOLIN HFA TAKE 2 PUFFS BY MOUTH EVERY 6 HOURS AS NEEDED FOR WHEEZE OR SHORTNESS OF BREATH   amoxicillin 500 MG tablet Commonly known as: AMOXIL Take 2,000 mg by mouth See admin instructions. Take 2,000 mg one hour prior to dental visits.   aspirin EC 81 MG tablet Take 81 mg by mouth daily.   b complex vitamins capsule Take 1 capsule by mouth 2 (two) times daily.   carvedilol 3.125 MG tablet Commonly known as: COREG TAKE ONE TABLET BY MOUTH TWICE DAILY WITH A MEAL   cholecalciferol 25 MCG (1000 UNIT) tablet Commonly known as: VITAMIN D3 Take 4,000 Units by mouth daily.   furosemide 20 MG tablet Commonly known as: LASIX TAKE ONE TABLET BY MOUTH EVERY DAY   levothyroxine 88 MCG tablet Commonly known as: SYNTHROID Take 1 tablet (88  mcg total) by mouth daily before breakfast.   lidocaine-prilocaine cream Commonly known as: EMLA Apply 1 Application topically as needed. Apply to port per instructions   loperamide 2 MG tablet Commonly known as: IMODIUM A-D Take 4 mg by mouth 3 (three) times daily as needed for diarrhea or loose stools.   LORazepam 1 MG tablet Commonly known as: ATIVAN Take 1 tablet (1 mg total) by mouth every 8 (eight) hours as needed for anxiety.   methocarbamol 500 MG tablet Commonly known as: ROBAXIN TAKE ONE TABLET BY MOUTH EVERY 6 HOURS AS NEEDED FOR MUSCLE SPASMS   Potassium Chloride ER 20 MEQ Tbcr Take 1 tablet (20 mEq total) by mouth daily.   sodium chloride 0.9 % SOLN 50 mL with pembrolizumab 100 MG/4ML SOLN 200 mg Inject 200 mg into the vein every 21 (  twenty-one) days.   spironolactone 25 MG tablet Commonly known as: ALDACTONE Take 1 tablet (25 mg total) by mouth daily.   traMADol 50 MG tablet Commonly known as: ULTRAM TAKE ONE TABLET BY MOUTH EVERY 6 HOURS AS NEEDED   Vitamin D (Ergocalciferol) 1.25 MG (50000 UNIT) Caps capsule Commonly known as: DRISDOL TAKE ONE CAPSULE BY MOUTH every 7 DAYS   warfarin 7.5 MG tablet Commonly known as: COUMADIN Take as directed by the anticoagulation clinic. If you are unsure how to take this medication, talk to your nurse or doctor. Original instructions: Take 1 tablet by mouth daily except 1/2 tablet on Monday and Fridays or as directed by Anticoagulation Clinic.   zolpidem 10 MG tablet Commonly known as: AMBIEN TAKE ONE TABLET BY MOUTH EVERY DAY        Allergies:  Allergies  Allergen Reactions   Augmentin [Amoxicillin-Pot Clavulanate] Other (See Comments)    Muscle Aches   Prochlorperazine Edisylate Other (See Comments)    Nervous/ flutter/ shakes   Adhesive [Tape] Hives    Redness/hives from adhesive tape( tolerates latex gloves); tolerates paper tape    Past Medical History, Surgical history, Social history, and Family History were reviewed and updated.  Review of Systems:  Review of Systems  Constitutional: Negative.   HENT: Negative.    Eyes: Negative.   Respiratory: Negative.    Cardiovascular: Negative.   Gastrointestinal: Negative.   Genitourinary: Negative.   Musculoskeletal: Negative.   Skin: Negative.   Neurological: Negative.   Endo/Heme/Allergies: Negative.   Psychiatric/Behavioral: Negative.        Exam:  height is 5\' 6"  (1.676 m) and weight is 164 lb (74.4 kg). Her oral temperature is 98.4 F (36.9 C). Her blood pressure is 98/63 and her pulse is 77. Her respiration is 19 and oxygen saturation is 99%.   Wt Readings from Last 3 Encounters:  03/28/23 164 lb (74.4 kg)  03/07/23 167 lb (75.8 kg)  01/02/23 170 lb 4 oz (77.2 kg)    Physical  Exam Vitals reviewed.  HENT:     Head: Normocephalic and atraumatic.  Eyes:     Pupils: Pupils are equal, round, and reactive to light.  Cardiovascular:     Rate and Rhythm: Normal rate and regular rhythm.     Heart sounds: Normal heart sounds.  Pulmonary:     Effort: Pulmonary effort is normal.     Breath sounds: Normal breath sounds.  Abdominal:     General: Bowel sounds are normal.     Palpations: Abdomen is soft.  Musculoskeletal:        General: No tenderness or deformity. Normal range of motion.  Cervical back: Normal range of motion.  Lymphadenopathy:     Cervical: No cervical adenopathy.  Skin:    General: Skin is warm and dry.     Findings: No erythema or rash.  Neurological:     Mental Status: She is alert and oriented to person, place, and time.  Psychiatric:        Behavior: Behavior normal.        Thought Content: Thought content normal.        Judgment: Judgment normal.      Lab Results  Component Value Date   WBC 8.1 03/28/2023   HGB 11.7 (L) 03/28/2023   HCT 34.6 (L) 03/28/2023   MCV 91.5 03/28/2023   PLT 181 03/28/2023   Lab Results  Component Value Date   FERRITIN 19 03/07/2023   IRON 79 03/07/2023   TIBC 358 03/07/2023   UIBC 279 03/07/2023   IRONPCTSAT 22 03/07/2023   Lab Results  Component Value Date   RETICCTPCT 1.9 11/14/2022   RBC 3.78 (L) 03/28/2023   No results found for: "KPAFRELGTCHN", "LAMBDASER", "KAPLAMBRATIO" No results found for: "IGGSERUM", "IGA", "IGMSERUM" No results found for: "TOTALPROTELP", "ALBUMINELP", "A1GS", "A2GS", "BETS", "BETA2SER", "GAMS", "MSPIKE", "SPEI"   Chemistry      Component Value Date/Time   NA 137 03/28/2023 1031   NA 139 08/14/2020 1428   NA 140 03/15/2016 0953   K 3.5 03/28/2023 1031   K 3.9 03/15/2016 0953   CL 102 03/28/2023 1031   CL 102 12/13/2015 0803   CO2 27 03/28/2023 1031   CO2 19 (L) 03/15/2016 0953   BUN 13 03/28/2023 1031   BUN 16 08/14/2020 1428   BUN 15.9 03/15/2016 0953    CREATININE 1.13 (H) 03/28/2023 1031   CREATININE 1.4 (H) 03/15/2016 0953      Component Value Date/Time   CALCIUM 8.8 (L) 03/28/2023 1031   CALCIUM 9.3 03/15/2016 0953   ALKPHOS 38 03/28/2023 1031   ALKPHOS 55 03/15/2016 0953   AST 12 (L) 03/28/2023 1031   AST 17 03/15/2016 0953   ALT 8 03/28/2023 1031   ALT 23 03/15/2016 0953   BILITOT 0.4 03/28/2023 1031   BILITOT 0.38 03/15/2016 0953       Impression and Plan: Ms. Nungaray is a very pleasant 62 yo caucasian female with recurrent granulosa cell tumor.  We now have her on immunotherapy.  We are trying to avoid systemic chemotherapy if possible.  Labs reviewed and acceptable for treatment today.  I am so glad to hear that she is doing well and feeling well  Per Dr. Myna Hidalgo another potential treatment option would be a FGFR2 inhibitor - erdatifinib.  Disposition Treatment #8 today RTC 3 weeks MD, port labs ( CBC w/, CMP, Inhibin B, ferritin, iron) + cycle 9 She will be due for imaging around Dec 2024/Jan 2025 She will be due for repeat TSH testing in 6-8 weeks   Rushie Chestnut, PA-C 10/4/202411:33 AM

## 2023-03-28 NOTE — Patient Instructions (Signed)

## 2023-03-31 ENCOUNTER — Other Ambulatory Visit: Payer: Self-pay

## 2023-03-31 ENCOUNTER — Other Ambulatory Visit: Payer: Self-pay | Admitting: Hematology & Oncology

## 2023-03-31 DIAGNOSIS — D3910 Neoplasm of uncertain behavior of unspecified ovary: Secondary | ICD-10-CM

## 2023-03-31 DIAGNOSIS — I480 Paroxysmal atrial fibrillation: Secondary | ICD-10-CM

## 2023-03-31 LAB — INHIBIN B: Inhibin B: 264.4 pg/mL — ABNORMAL HIGH (ref 0.0–16.9)

## 2023-03-31 MED ORDER — WARFARIN SODIUM 7.5 MG PO TABS: ORAL_TABLET | ORAL | 0 refills | Status: DC

## 2023-03-31 NOTE — Telephone Encounter (Signed)
Prescription refill request received for warfarin Lov: 10/16/22 [name])  Next INR check: 04/16/23 Warfarin tablet strength: 7.5mg   Appropriate dose. Refill sent.

## 2023-04-01 ENCOUNTER — Ambulatory Visit: Payer: 59

## 2023-04-01 DIAGNOSIS — I255 Ischemic cardiomyopathy: Secondary | ICD-10-CM

## 2023-04-01 DIAGNOSIS — I5022 Chronic systolic (congestive) heart failure: Secondary | ICD-10-CM | POA: Diagnosis not present

## 2023-04-01 LAB — CUP PACEART REMOTE DEVICE CHECK
Battery Remaining Longevity: 75 mo
Battery Remaining Percentage: 72 %
Battery Voltage: 2.99 V
Brady Statistic AP VP Percent: 1 %
Brady Statistic AP VS Percent: 3.1 %
Brady Statistic AS VP Percent: 1 %
Brady Statistic AS VS Percent: 95 %
Brady Statistic RA Percent Paced: 1 %
Brady Statistic RV Percent Paced: 1 %
Date Time Interrogation Session: 20241008020712
HighPow Impedance: 72 Ohm
Implantable Lead Connection Status: 753985
Implantable Lead Connection Status: 753985
Implantable Lead Implant Date: 20110503
Implantable Lead Implant Date: 20130815
Implantable Lead Location: 753859
Implantable Lead Location: 753860
Implantable Pulse Generator Implant Date: 20211012
Lead Channel Impedance Value: 260 Ohm
Lead Channel Impedance Value: 440 Ohm
Lead Channel Pacing Threshold Amplitude: 0.5 V
Lead Channel Pacing Threshold Amplitude: 2.25 V
Lead Channel Pacing Threshold Pulse Width: 0.5 ms
Lead Channel Pacing Threshold Pulse Width: 1 ms
Lead Channel Sensing Intrinsic Amplitude: 1.8 mV
Lead Channel Sensing Intrinsic Amplitude: 11.9 mV
Lead Channel Setting Pacing Amplitude: 2 V
Lead Channel Setting Pacing Amplitude: 2.5 V
Lead Channel Setting Pacing Pulse Width: 1 ms
Lead Channel Setting Sensing Sensitivity: 0.5 mV
Pulse Gen Serial Number: 111028151
Zone Setting Status: 755011

## 2023-04-07 ENCOUNTER — Other Ambulatory Visit: Payer: Self-pay | Admitting: Hematology & Oncology

## 2023-04-07 DIAGNOSIS — D3910 Neoplasm of uncertain behavior of unspecified ovary: Secondary | ICD-10-CM

## 2023-04-09 ENCOUNTER — Other Ambulatory Visit: Payer: Self-pay | Admitting: Hematology & Oncology

## 2023-04-09 DIAGNOSIS — D3910 Neoplasm of uncertain behavior of unspecified ovary: Secondary | ICD-10-CM

## 2023-04-11 ENCOUNTER — Other Ambulatory Visit: Payer: Self-pay | Admitting: Hematology & Oncology

## 2023-04-11 DIAGNOSIS — F419 Anxiety disorder, unspecified: Secondary | ICD-10-CM

## 2023-04-16 ENCOUNTER — Ambulatory Visit: Payer: 59

## 2023-04-16 ENCOUNTER — Other Ambulatory Visit: Payer: Self-pay | Admitting: Hematology & Oncology

## 2023-04-17 NOTE — Progress Notes (Signed)
Remote ICD transmission.   

## 2023-04-18 ENCOUNTER — Inpatient Hospital Stay: Payer: 59

## 2023-04-18 VITALS — BP 100/50 | HR 66

## 2023-04-18 DIAGNOSIS — D3910 Neoplasm of uncertain behavior of unspecified ovary: Secondary | ICD-10-CM

## 2023-04-18 DIAGNOSIS — Z5112 Encounter for antineoplastic immunotherapy: Secondary | ICD-10-CM | POA: Diagnosis not present

## 2023-04-18 LAB — CMP (CANCER CENTER ONLY)
ALT: 8 U/L (ref 0–44)
AST: 13 U/L — ABNORMAL LOW (ref 15–41)
Albumin: 3.9 g/dL (ref 3.5–5.0)
Alkaline Phosphatase: 34 U/L — ABNORMAL LOW (ref 38–126)
Anion gap: 9 (ref 5–15)
BUN: 9 mg/dL (ref 8–23)
CO2: 27 mmol/L (ref 22–32)
Calcium: 8.2 mg/dL — ABNORMAL LOW (ref 8.9–10.3)
Chloride: 103 mmol/L (ref 98–111)
Creatinine: 1.04 mg/dL — ABNORMAL HIGH (ref 0.44–1.00)
GFR, Estimated: >60 mL/min (ref >60)
Glucose, Bld: 87 mg/dL (ref 70–99)
Potassium: 3.4 mmol/L — ABNORMAL LOW (ref 3.5–5.1)
Sodium: 139 mmol/L (ref 135–145)
Total Bilirubin: 0.4 mg/dL (ref 0.3–1.2)
Total Protein: 5.7 g/dL — ABNORMAL LOW (ref 6.5–8.1)

## 2023-04-18 LAB — CBC WITH DIFFERENTIAL (CANCER CENTER ONLY)
Abs Immature Granulocytes: 0.03 10*3/uL (ref 0.00–0.07)
Basophils Absolute: 0 10*3/uL (ref 0.0–0.1)
Basophils Relative: 0 %
Eosinophils Absolute: 0.1 10*3/uL (ref 0.0–0.5)
Eosinophils Relative: 1 %
HCT: 33.2 % — ABNORMAL LOW (ref 36.0–46.0)
Hemoglobin: 11.3 g/dL — ABNORMAL LOW (ref 12.0–15.0)
Immature Granulocytes: 0 %
Lymphocytes Relative: 7 %
Lymphs Abs: 0.6 10*3/uL — ABNORMAL LOW (ref 0.7–4.0)
MCH: 30.8 pg (ref 26.0–34.0)
MCHC: 34 g/dL (ref 30.0–36.0)
MCV: 90.5 fL (ref 80.0–100.0)
Monocytes Absolute: 0.7 10*3/uL (ref 0.1–1.0)
Monocytes Relative: 9 %
Neutro Abs: 6.6 10*3/uL (ref 1.7–7.7)
Neutrophils Relative %: 83 %
Platelet Count: 184 10*3/uL (ref 150–400)
RBC: 3.67 MIL/uL — ABNORMAL LOW (ref 3.87–5.11)
RDW: 13.2 % (ref 11.5–15.5)
Smear Review: NORMAL
WBC Count: 8 10*3/uL (ref 4.0–10.5)
nRBC: 0 % (ref 0.0–0.2)

## 2023-04-18 LAB — TSH: TSH: 1.517 u[IU]/mL (ref 0.350–4.500)

## 2023-04-18 MED ORDER — SODIUM CHLORIDE 0.9% FLUSH
10.0000 mL | INTRAVENOUS | Status: DC | PRN
  Administered 2023-04-18: 10 mL

## 2023-04-18 MED ORDER — HEPARIN SOD (PORK) LOCK FLUSH 100 UNIT/ML IV SOLN
500.0000 [IU] | Freq: Once | INTRAVENOUS | Status: AC | PRN
  Administered 2023-04-18: 500 [IU]

## 2023-04-18 MED ORDER — SODIUM CHLORIDE 0.9 % IV SOLN
200.0000 mg | Freq: Once | INTRAVENOUS | Status: AC
  Administered 2023-04-18: 200 mg via INTRAVENOUS
  Filled 2023-04-18: qty 8

## 2023-04-18 MED ORDER — SODIUM CHLORIDE 0.9 % IV SOLN: Freq: Once | INTRAVENOUS | Status: AC

## 2023-04-18 NOTE — Patient Instructions (Signed)
[facility]  Discharge Instructions: Thank you for choosing [facility] to provide your oncology and hematology care.   If you have a lab appointment with the Cancer Center, please go directly to the Cancer Center and check in at the registration area.  Wear comfortable clothing and clothing appropriate for easy access to any Portacath or PICC line.   We strive to give you quality time with your provider. You may need to reschedule your appointment if you arrive late (15 or more minutes).  Arriving late affects you and other patients whose appointments are after yours.  Also, if you miss three or more appointments without notifying the office, you may be dismissed from the clinic at the provider's discretion.      For prescription refill requests, have your pharmacy contact our office and allow 72 hours for refills to be completed.    Today you received the following chemotherapy and/or immunotherapy agents keytruda      To help prevent nausea and vomiting after your treatment, we encourage you to take your nausea medication as directed.  BELOW ARE SYMPTOMS THAT SHOULD BE REPORTED IMMEDIATELY: *FEVER GREATER THAN 100.4 F (38 C) OR HIGHER *CHILLS OR SWEATING *NAUSEA AND VOMITING THAT IS NOT CONTROLLED WITH YOUR NAUSEA MEDICATION *UNUSUAL SHORTNESS OF BREATH *UNUSUAL BRUISING OR BLEEDING *URINARY PROBLEMS (pain or burning when urinating, or frequent urination) *BOWEL PROBLEMS (unusual diarrhea, constipation, pain near the anus) TENDERNESS IN MOUTH AND THROAT WITH OR WITHOUT PRESENCE OF ULCERS (sore throat, sores in mouth, or a toothache) UNUSUAL RASH, SWELLING OR PAIN  UNUSUAL VAGINAL DISCHARGE OR ITCHING   Items with * indicate a potential emergency and should be followed up as soon as possible or go to the Emergency Department if any problems should occur.  Please show the CHEMOTHERAPY ALERT CARD or IMMUNOTHERAPY ALERT CARD at  check-in to the Emergency Department and triage nurse. Should you have questions after your visit or need to cancel or reschedule your appointment, please contact [facility]  [phone] and follow the prompts.  Office hours are 8:00 a.m. to 4:30 p.m. Monday - Friday. Please note that voicemails left after 4:00 p.m. may not be returned until the following business day.  We are closed weekends and major holidays. You have access to a nurse at all times for urgent questions. Please call the main number to the clinic [phone] and follow the prompts.  For any non-urgent questions, you may also contact your provider using MyChart. We now offer e-Visits for anyone 18 and older to request care online for non-urgent symptoms. For details visit [email or web address].   Also download the MyChart app! Go to the app store, search "MyChart", open the app, select [facility], and log in with your MyChart username and password.   

## 2023-04-18 NOTE — Patient Instructions (Signed)

## 2023-04-20 LAB — T4: T4, Total: 9.3 ug/dL (ref 4.5–12.0)

## 2023-04-23 ENCOUNTER — Ambulatory Visit: Payer: 59 | Attending: Internal Medicine

## 2023-04-23 DIAGNOSIS — I480 Paroxysmal atrial fibrillation: Secondary | ICD-10-CM | POA: Diagnosis not present

## 2023-04-23 DIAGNOSIS — Z953 Presence of xenogenic heart valve: Secondary | ICD-10-CM

## 2023-04-23 DIAGNOSIS — Z952 Presence of prosthetic heart valve: Secondary | ICD-10-CM | POA: Diagnosis not present

## 2023-04-23 LAB — POCT INR: INR: 2.6 (ref 2.0–3.0)

## 2023-04-23 NOTE — Patient Instructions (Signed)
Description   Continue taking warfarin 1 tablet daily except for 1/2 a tablet on Mondays, Wednesdays and Fridays.  Recheck INR in 6 weeks.  Coumadin Clinic [phone]

## 2023-04-30 ENCOUNTER — Other Ambulatory Visit: Payer: Self-pay | Admitting: Hematology & Oncology

## 2023-04-30 DIAGNOSIS — D3910 Neoplasm of uncertain behavior of unspecified ovary: Secondary | ICD-10-CM

## 2023-05-09 ENCOUNTER — Inpatient Hospital Stay: Payer: 59 | Attending: Hematology & Oncology

## 2023-05-09 ENCOUNTER — Inpatient Hospital Stay: Payer: 59 | Attending: Hematology & Oncology | Admitting: Hematology & Oncology

## 2023-05-09 ENCOUNTER — Other Ambulatory Visit: Payer: Self-pay

## 2023-05-09 ENCOUNTER — Inpatient Hospital Stay: Payer: 59

## 2023-05-09 ENCOUNTER — Encounter: Payer: Self-pay | Admitting: Hematology & Oncology

## 2023-05-09 VITALS — BP 110/59 | HR 73 | Temp 97.9°F | Resp 19 | Ht 66.0 in | Wt 166.0 lb

## 2023-05-09 DIAGNOSIS — Z79620 Long term (current) use of immunosuppressive biologic: Secondary | ICD-10-CM | POA: Diagnosis not present

## 2023-05-09 DIAGNOSIS — K909 Intestinal malabsorption, unspecified: Secondary | ICD-10-CM | POA: Insufficient documentation

## 2023-05-09 DIAGNOSIS — Z952 Presence of prosthetic heart valve: Secondary | ICD-10-CM | POA: Insufficient documentation

## 2023-05-09 DIAGNOSIS — D3910 Neoplasm of uncertain behavior of unspecified ovary: Secondary | ICD-10-CM

## 2023-05-09 DIAGNOSIS — Z5112 Encounter for antineoplastic immunotherapy: Secondary | ICD-10-CM | POA: Diagnosis present

## 2023-05-09 DIAGNOSIS — E039 Hypothyroidism, unspecified: Secondary | ICD-10-CM

## 2023-05-09 DIAGNOSIS — C569 Malignant neoplasm of unspecified ovary: Secondary | ICD-10-CM | POA: Diagnosis present

## 2023-05-09 DIAGNOSIS — D508 Other iron deficiency anemias: Secondary | ICD-10-CM | POA: Insufficient documentation

## 2023-05-09 DIAGNOSIS — R946 Abnormal results of thyroid function studies: Secondary | ICD-10-CM | POA: Insufficient documentation

## 2023-05-09 DIAGNOSIS — Z79890 Hormone replacement therapy (postmenopausal): Secondary | ICD-10-CM | POA: Insufficient documentation

## 2023-05-09 DIAGNOSIS — C786 Secondary malignant neoplasm of retroperitoneum and peritoneum: Secondary | ICD-10-CM | POA: Insufficient documentation

## 2023-05-09 DIAGNOSIS — Z7901 Long term (current) use of anticoagulants: Secondary | ICD-10-CM | POA: Insufficient documentation

## 2023-05-09 LAB — CMP (CANCER CENTER ONLY)
ALT: 8 U/L (ref 0–44)
AST: 13 U/L — ABNORMAL LOW (ref 15–41)
Albumin: 4 g/dL (ref 3.5–5.0)
Alkaline Phosphatase: 42 U/L (ref 38–126)
Anion gap: 9 (ref 5–15)
BUN: 7 mg/dL — ABNORMAL LOW (ref 8–23)
CO2: 27 mmol/L (ref 22–32)
Calcium: 9.3 mg/dL (ref 8.9–10.3)
Chloride: 102 mmol/L (ref 98–111)
Creatinine: 1.15 mg/dL — ABNORMAL HIGH (ref 0.44–1.00)
GFR, Estimated: 54 mL/min — ABNORMAL LOW (ref >60)
Glucose, Bld: 91 mg/dL (ref 70–99)
Potassium: 3.5 mmol/L (ref 3.5–5.1)
Sodium: 138 mmol/L (ref 135–145)
Total Bilirubin: 0.3 mg/dL (ref <1.2)
Total Protein: 6 g/dL — ABNORMAL LOW (ref 6.5–8.1)

## 2023-05-09 LAB — CBC WITH DIFFERENTIAL (CANCER CENTER ONLY)
Abs Immature Granulocytes: 0.03 10*3/uL (ref 0.00–0.07)
Basophils Absolute: 0 10*3/uL (ref 0.0–0.1)
Basophils Relative: 0 %
Eosinophils Absolute: 0.1 10*3/uL (ref 0.0–0.5)
Eosinophils Relative: 2 %
HCT: 33.7 % — ABNORMAL LOW (ref 36.0–46.0)
Hemoglobin: 11.3 g/dL — ABNORMAL LOW (ref 12.0–15.0)
Immature Granulocytes: 0 %
Lymphocytes Relative: 8 %
Lymphs Abs: 0.6 10*3/uL — ABNORMAL LOW (ref 0.7–4.0)
MCH: 30.6 pg (ref 26.0–34.0)
MCHC: 33.5 g/dL (ref 30.0–36.0)
MCV: 91.3 fL (ref 80.0–100.0)
Monocytes Absolute: 0.7 10*3/uL (ref 0.1–1.0)
Monocytes Relative: 10 %
Neutro Abs: 6 10*3/uL (ref 1.7–7.7)
Neutrophils Relative %: 80 %
Platelet Count: 187 10*3/uL (ref 150–400)
RBC: 3.69 MIL/uL — ABNORMAL LOW (ref 3.87–5.11)
RDW: 13.2 % (ref 11.5–15.5)
WBC Count: 7.5 10*3/uL (ref 4.0–10.5)
nRBC: 0 % (ref 0.0–0.2)

## 2023-05-09 LAB — IRON AND IRON BINDING CAPACITY (CC-WL,HP ONLY)
Iron: 68 ug/dL (ref 28–170)
Saturation Ratios: 18 % (ref 10.4–31.8)
TIBC: 377 ug/dL (ref 250–450)
UIBC: 309 ug/dL (ref 148–442)

## 2023-05-09 MED ORDER — SODIUM CHLORIDE 0.9 % IV SOLN: Freq: Once | INTRAVENOUS | Status: AC

## 2023-05-09 MED ORDER — SODIUM CHLORIDE 0.9 % IV SOLN
200.0000 mg | Freq: Once | INTRAVENOUS | Status: AC
  Administered 2023-05-09: 200 mg via INTRAVENOUS
  Filled 2023-05-09: qty 8

## 2023-05-09 MED ORDER — HEPARIN SOD (PORK) LOCK FLUSH 100 UNIT/ML IV SOLN
500.0000 [IU] | Freq: Once | INTRAVENOUS | Status: AC | PRN
  Administered 2023-05-09: 500 [IU]

## 2023-05-09 MED ORDER — SODIUM CHLORIDE 0.9% FLUSH
10.0000 mL | INTRAVENOUS | Status: DC | PRN
  Administered 2023-05-09: 10 mL

## 2023-05-09 NOTE — Patient Instructions (Signed)
 [facility] DEPT OF [facility]  Discharge Instructions: Thank you for choosing [facility] to provide your oncology and hematology care.   If you have a lab appointment with the Cancer Center, please go directly to the Cancer Center and check in at the registration area.  Wear comfortable clothing and clothing appropriate for easy access to any Portacath or PICC line.   We strive to give you quality time with your provider. You may need to reschedule your appointment if you arrive late (15 or more minutes).  Arriving late affects you and other patients whose appointments are after yours.  Also, if you miss three or more appointments without notifying the office, you may be dismissed from the clinic at the provider's discretion.      For prescription refill requests, have your pharmacy contact our office and allow 72 hours for refills to be completed.    Today you received the following chemotherapy and/or immunotherapy agents:  Keytruda      To help prevent nausea and vomiting after your treatment, we encourage you to take your nausea medication as directed.  BELOW ARE SYMPTOMS THAT SHOULD BE REPORTED IMMEDIATELY: *FEVER GREATER THAN 100.4 F (38 C) OR HIGHER *CHILLS OR SWEATING *NAUSEA AND VOMITING THAT IS NOT CONTROLLED WITH YOUR NAUSEA MEDICATION *UNUSUAL SHORTNESS OF BREATH *UNUSUAL BRUISING OR BLEEDING *URINARY PROBLEMS (pain or burning when urinating, or frequent urination) *BOWEL PROBLEMS (unusual diarrhea, constipation, pain near the anus) TENDERNESS IN MOUTH AND THROAT WITH OR WITHOUT PRESENCE OF ULCERS (sore throat, sores in mouth, or a toothache) UNUSUAL RASH, SWELLING OR PAIN  UNUSUAL VAGINAL DISCHARGE OR ITCHING   Items with * indicate a potential emergency and should be followed up as soon as possible or go to the Emergency Department if any problems should occur.  Please show the CHEMOTHERAPY ALERT CARD or IMMUNOTHERAPY  ALERT CARD at check-in to the Emergency Department and triage nurse. Should you have questions after your visit or need to cancel or reschedule your appointment, please contact [facility] - [facility] OF [facility]  [phone] and follow the prompts.  Office hours are 8:00 a.m. to 4:30 p.m. Monday - Friday. Please note that voicemails left after 4:00 p.m. may not be returned until the following business day.  We are closed weekends and major holidays. You have access to a nurse at all times for urgent questions. Please call the main number to the clinic [phone] and follow the prompts.  For any non-urgent questions, you may also contact your provider using MyChart. We now offer e-Visits for anyone 50 and older to request care online for non-urgent symptoms. For details visit [email or web address].   Also download the MyChart app! Go to the app store, search "MyChart", open the app, select , and log in with your MyChart username and password.

## 2023-05-09 NOTE — Patient Instructions (Signed)

## 2023-05-09 NOTE — Progress Notes (Signed)
Hematology and Oncology Follow Up Visit  [name] [id number] February 24, 1961 62 y.o. 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  Overall, she seems to be doing fairly well.  She has had no problems with cough or shortness of breath.  She is on Coumadin because of her heart valve.  The inhibin B that we did on her last time was 266.  This is little bit troublesome..  She is complaining of some back spasms.  She is having some pain over in the right lower quadrant of her abdomen.  I think that the inhibin B level is higher, then I think that we will  have to do another set of scans.  Of note, she does have the FGFR2 mutation so we can certainly use one of the new targeted drugs for her.  She has had no issues with bleeding.  There is been no change in bowel or bladder habits.  There is been no leg swelling.  We do watch her for iron deficiency.  When we last saw her, her ferritin was 15 with iron saturation of 19%.    Overall, I would say that her performance status is probably ECOG 1.     Medications:  Allergies as of 05/09/2023       Reactions   Augmentin [amoxicillin-pot Clavulanate] Other (See Comments)   Muscle Aches   Prochlorperazine Edisylate Other (See Comments)   Nervous/ flutter/ shakes   Adhesive [tape] Hives   Redness/hives from adhesive tape( tolerates latex gloves); tolerates paper tape        Medication List        Accurate as of May 09, 2023  1:21 PM. If you have any questions, ask your nurse or doctor.          albuterol 108 (90 Base) MCG/ACT inhaler Commonly known as: VENTOLIN HFA TAKE 2 PUFFS  BY MOUTH EVERY 6 HOURS AS NEEDED FOR WHEEZE OR SHORTNESS OF BREATH   amoxicillin 500 MG tablet Commonly known as: AMOXIL Take 2,000 mg by mouth See admin instructions. Take 2,000 mg one hour prior to dental visits.   aspirin EC 81 MG tablet Take 81 mg by mouth daily.   b complex vitamins capsule Take 1 capsule by mouth 2 (two) times daily.   carvedilol 3.125 MG tablet Commonly known as: COREG TAKE ONE TABLET BY MOUTH TWICE DAILY WITH A MEAL   cholecalciferol 25 MCG (1000 UNIT) tablet Commonly known as: VITAMIN D3 Take 4,000 Units by mouth daily.   furosemide 20 MG tablet Commonly known as: LASIX TAKE ONE TABLET BY MOUTH EVERY DAY   levothyroxine 88 MCG tablet Commonly known as: SYNTHROID Take 1 tablet (88 mcg total) by mouth daily before breakfast.   lidocaine-prilocaine cream Commonly known as: EMLA Apply 1 Application topically as needed. Apply to port per instructions   loperamide 2 MG tablet Commonly known as: IMODIUM A-D Take 4 mg by mouth 3 (three) times daily as needed for diarrhea or loose stools.   LORazepam 1 MG tablet Commonly known as: ATIVAN TAKE ONE TABLET BY MOUTH EVERY 8 HOURS AS NEEDED FOR  ANXIETY   methocarbamol 500 MG tablet Commonly known as: ROBAXIN TAKE ONE TABLET BY MOUTH EVERY 6 HOURS AS NEEDED FOR MUSCLE SPASMS   Potassium Chloride ER 20 MEQ Tbcr Take 1 tablet (20 mEq total) by mouth daily.   sodium chloride 0.9 % SOLN 50 mL with pembrolizumab 100 MG/4ML SOLN 200 mg Inject 200 mg into the vein every 21 ( twenty-one) days.   spironolactone 25 MG tablet Commonly known as: ALDACTONE Take 1 tablet (25 mg total) by mouth daily.   traMADol 50 MG tablet Commonly known as: ULTRAM TAKE ONE TABLET BY MOUTH EVERY 6 HOURS AS NEEDED   Vitamin D (Ergocalciferol) 1.25 MG (50000 UNIT) Caps capsule Commonly known as: DRISDOL TAKE ONE CAPSULE BY MOUTH every 7 DAYS   warfarin 7.5 MG tablet Commonly known as: COUMADIN Take as directed by the  anticoagulation clinic. If you are unsure how to take this medication, talk to your nurse or doctor. Original instructions: Take 1/2 tablet to 1 tablet by mouth daily as directed by Anticoagulation Clinic.   zolpidem 10 MG tablet Commonly known as: AMBIEN TAKE ONE TABLET BY MOUTH EVERY DAY        Allergies:  Allergies  Allergen Reactions   Augmentin [Amoxicillin-Pot Clavulanate] Other (See Comments)    Muscle Aches   Prochlorperazine Edisylate Other (See Comments)    Nervous/ flutter/ shakes   Adhesive [Tape] Hives    Redness/hives from adhesive tape( tolerates latex gloves); tolerates paper tape    Past Medical History, Surgical history, Social history, and Family History were reviewed and updated.  Review of Systems:  Review of Systems  Constitutional: Negative.   HENT: Negative.    Eyes: Negative.   Respiratory: Negative.    Cardiovascular: Negative.   Gastrointestinal: Negative.   Genitourinary: Negative.   Musculoskeletal: Negative.   Skin: Negative.   Neurological: Negative.   Endo/Heme/Allergies: Negative.   Psychiatric/Behavioral: Negative.        Exam:  height is 5\' 6"  (1.676 m) and weight is 166 lb (75.3 kg). Her oral temperature is 97.9 F (36.6 C). Her blood pressure is 110/59 (abnormal) and her pulse is 73. Her respiration is 19 and oxygen saturation is 99%.   Wt Readings from Last 3 Encounters:  05/09/23 166 lb (75.3 kg)  03/28/23 164 lb (74.4 kg)  03/07/23 167 lb (75.8 kg)    Physical Exam Vitals reviewed.  HENT:     Head: Normocephalic and atraumatic.  Eyes:     Pupils: Pupils are equal, round, and reactive to light.  Cardiovascular:     Rate and Rhythm: Normal rate and regular rhythm.     Heart sounds: Normal heart sounds.  Pulmonary:     Effort: Pulmonary effort is normal.     Breath sounds: Normal breath sounds.  Abdominal:     General: Bowel sounds are normal.     Palpations: Abdomen is soft.  Musculoskeletal:        General: No  tenderness or deformity. Normal range of motion.     Cervical back: Normal range of motion.  Lymphadenopathy:     Cervical: No cervical adenopathy.  Skin:    General: Skin is warm and dry.     Findings: No erythema or rash.  Neurological:     Mental Status: She is alert and oriented to person, place, and time.  Psychiatric:        Behavior: Behavior normal.        Thought Content: Thought content normal.  Judgment: Judgment normal.      Lab Results  Component Value Date   WBC 7.5 05/09/2023   HGB 11.3 (L) 05/09/2023   HCT 33.7 (L) 05/09/2023   MCV 91.3 05/09/2023   PLT 187 05/09/2023   Lab Results  Component Value Date   FERRITIN 15 03/28/2023   IRON 72 03/28/2023   TIBC 382 03/28/2023   UIBC 310 03/28/2023   IRONPCTSAT 19 03/28/2023   Lab Results  Component Value Date   RETICCTPCT 1.9 11/14/2022   RBC 3.69 (L) 05/09/2023   No results found for: "KPAFRELGTCHN", "LAMBDASER", "KAPLAMBRATIO" No results found for: "IGGSERUM", "IGA", "IGMSERUM" No results found for: "TOTALPROTELP", "ALBUMINELP", "A1GS", "A2GS", "BETS", "BETA2SER", "GAMS", "MSPIKE", "SPEI"   Chemistry      Component Value Date/Time   NA 138 05/09/2023 1215   NA 139 08/14/2020 1428   NA 140 03/15/2016 0953   K 3.5 05/09/2023 1215   K 3.9 03/15/2016 0953   CL 102 05/09/2023 1215   CL 102 12/13/2015 0803   CO2 27 05/09/2023 1215   CO2 19 (L) 03/15/2016 0953   BUN 7 (L) 05/09/2023 1215   BUN 16 08/14/2020 1428   BUN 15.9 03/15/2016 0953   CREATININE 1.15 (H) 05/09/2023 1215   CREATININE 1.4 (H) 03/15/2016 0953      Component Value Date/Time   CALCIUM 9.3 05/09/2023 1215   CALCIUM 9.3 03/15/2016 0953   ALKPHOS 42 05/09/2023 1215   ALKPHOS 55 03/15/2016 0953   AST 13 (L) 05/09/2023 1215   AST 17 03/15/2016 0953   ALT 8 05/09/2023 1215   ALT 23 03/15/2016 0953   BILITOT 0.3 05/09/2023 1215   BILITOT 0.38 03/15/2016 0953       Impression and Plan: Ms. Cadman is a very pleasant 62 yo  caucasian female with recurrent granulosa cell tumor.  We now have her on immunotherapy.  We are trying to avoid systemic chemotherapy if possible.  Hopefully, we will continue to see that her disease is stable.  Again, the Inhibin B is certainly a marker that we can use.  We will go ahead with her on the 9th treatment today.  I will have her come by to see [place] in another 3 weeks.  Again, I think other option that we have for her might be to use is the FGFR2 inhibitor - erdatifinib.   Josph Macho, MD 11/15/20241:21 PM

## 2023-05-10 LAB — FERRITIN: Ferritin: 14 ng/mL (ref 11–307)

## 2023-05-10 LAB — TSH: TSH: 5.223 u[IU]/mL — ABNORMAL HIGH (ref 0.350–4.500)

## 2023-05-12 ENCOUNTER — Encounter: Payer: Self-pay | Admitting: *Deleted

## 2023-05-12 ENCOUNTER — Other Ambulatory Visit: Payer: Self-pay | Admitting: Hematology & Oncology

## 2023-05-12 DIAGNOSIS — F419 Anxiety disorder, unspecified: Secondary | ICD-10-CM

## 2023-05-12 LAB — INHIBIN B: Inhibin B: 238.6 pg/mL — ABNORMAL HIGH (ref 0.0–16.9)

## 2023-05-12 MED ORDER — LEVOTHYROXINE SODIUM 100 MCG PO TABS: 100.0000 ug | ORAL_TABLET | Freq: Every day | ORAL | 3 refills | Status: DC

## 2023-05-21 ENCOUNTER — Other Ambulatory Visit: Payer: Self-pay | Admitting: Hematology & Oncology

## 2023-05-21 DIAGNOSIS — D3910 Neoplasm of uncertain behavior of unspecified ovary: Secondary | ICD-10-CM

## 2023-05-29 ENCOUNTER — Other Ambulatory Visit: Payer: Self-pay | Admitting: Hematology & Oncology

## 2023-05-29 DIAGNOSIS — D3910 Neoplasm of uncertain behavior of unspecified ovary: Secondary | ICD-10-CM

## 2023-05-30 ENCOUNTER — Encounter: Payer: Self-pay | Admitting: Oncology

## 2023-05-30 ENCOUNTER — Inpatient Hospital Stay: Payer: 59 | Attending: Hematology & Oncology | Admitting: Oncology

## 2023-05-30 ENCOUNTER — Encounter: Payer: Self-pay | Admitting: Hematology & Oncology

## 2023-05-30 ENCOUNTER — Inpatient Hospital Stay: Payer: 59

## 2023-05-30 VITALS — BP 107/58 | HR 71 | Temp 97.9°F | Resp 19 | Ht 66.0 in | Wt 166.0 lb

## 2023-05-30 DIAGNOSIS — Z7901 Long term (current) use of anticoagulants: Secondary | ICD-10-CM | POA: Insufficient documentation

## 2023-05-30 DIAGNOSIS — D500 Iron deficiency anemia secondary to blood loss (chronic): Secondary | ICD-10-CM | POA: Diagnosis not present

## 2023-05-30 DIAGNOSIS — R197 Diarrhea, unspecified: Secondary | ICD-10-CM | POA: Insufficient documentation

## 2023-05-30 DIAGNOSIS — C569 Malignant neoplasm of unspecified ovary: Secondary | ICD-10-CM | POA: Diagnosis present

## 2023-05-30 DIAGNOSIS — C786 Secondary malignant neoplasm of retroperitoneum and peritoneum: Secondary | ICD-10-CM | POA: Diagnosis present

## 2023-05-30 DIAGNOSIS — Z95828 Presence of other vascular implants and grafts: Secondary | ICD-10-CM

## 2023-05-30 DIAGNOSIS — Z79620 Long term (current) use of immunosuppressive biologic: Secondary | ICD-10-CM | POA: Diagnosis not present

## 2023-05-30 DIAGNOSIS — D6481 Anemia due to antineoplastic chemotherapy: Secondary | ICD-10-CM

## 2023-05-30 DIAGNOSIS — K909 Intestinal malabsorption, unspecified: Secondary | ICD-10-CM | POA: Insufficient documentation

## 2023-05-30 DIAGNOSIS — D3910 Neoplasm of uncertain behavior of unspecified ovary: Secondary | ICD-10-CM

## 2023-05-30 DIAGNOSIS — D508 Other iron deficiency anemias: Secondary | ICD-10-CM | POA: Diagnosis not present

## 2023-05-30 DIAGNOSIS — Z952 Presence of prosthetic heart valve: Secondary | ICD-10-CM | POA: Diagnosis not present

## 2023-05-30 DIAGNOSIS — Z5112 Encounter for antineoplastic immunotherapy: Secondary | ICD-10-CM | POA: Diagnosis present

## 2023-05-30 DIAGNOSIS — T451X5A Adverse effect of antineoplastic and immunosuppressive drugs, initial encounter: Secondary | ICD-10-CM

## 2023-05-30 LAB — CMP (CANCER CENTER ONLY)
ALT: 8 U/L (ref 0–44)
AST: 13 U/L — ABNORMAL LOW (ref 15–41)
Albumin: 3.6 g/dL (ref 3.5–5.0)
Alkaline Phosphatase: 40 U/L (ref 38–126)
Anion gap: 8 (ref 5–15)
BUN: 9 mg/dL (ref 8–23)
CO2: 27 mmol/L (ref 22–32)
Calcium: 8.5 mg/dL — ABNORMAL LOW (ref 8.9–10.3)
Chloride: 104 mmol/L (ref 98–111)
Creatinine: 1.03 mg/dL — ABNORMAL HIGH (ref 0.44–1.00)
GFR, Estimated: >60 mL/min (ref >60)
Glucose, Bld: 83 mg/dL (ref 70–99)
Potassium: 3.5 mmol/L (ref 3.5–5.1)
Sodium: 139 mmol/L (ref 135–145)
Total Bilirubin: 0.3 mg/dL (ref <1.2)
Total Protein: 6.1 g/dL — ABNORMAL LOW (ref 6.5–8.1)

## 2023-05-30 LAB — IRON AND IRON BINDING CAPACITY (CC-WL,HP ONLY)
Iron: 56 ug/dL (ref 28–170)
Saturation Ratios: 15 % (ref 10.4–31.8)
TIBC: 384 ug/dL (ref 250–450)
UIBC: 328 ug/dL (ref 148–442)

## 2023-05-30 LAB — CBC WITH DIFFERENTIAL (CANCER CENTER ONLY)
Abs Immature Granulocytes: 0.04 10*3/uL (ref 0.00–0.07)
Basophils Absolute: 0 10*3/uL (ref 0.0–0.1)
Basophils Relative: 1 %
Eosinophils Absolute: 0.1 10*3/uL (ref 0.0–0.5)
Eosinophils Relative: 2 %
HCT: 31.3 % — ABNORMAL LOW (ref 36.0–46.0)
Hemoglobin: 10.5 g/dL — ABNORMAL LOW (ref 12.0–15.0)
Immature Granulocytes: 1 %
Lymphocytes Relative: 8 %
Lymphs Abs: 0.7 10*3/uL (ref 0.7–4.0)
MCH: 30.4 pg (ref 26.0–34.0)
MCHC: 33.5 g/dL (ref 30.0–36.0)
MCV: 90.7 fL (ref 80.0–100.0)
Monocytes Absolute: 0.7 10*3/uL (ref 0.1–1.0)
Monocytes Relative: 9 %
Neutro Abs: 6.3 10*3/uL (ref 1.7–7.7)
Neutrophils Relative %: 79 %
Platelet Count: 202 10*3/uL (ref 150–400)
RBC: 3.45 MIL/uL — ABNORMAL LOW (ref 3.87–5.11)
RDW: 13.3 % (ref 11.5–15.5)
WBC Count: 7.9 10*3/uL (ref 4.0–10.5)
nRBC: 0 % (ref 0.0–0.2)

## 2023-05-30 LAB — FERRITIN: Ferritin: 27 ng/mL (ref 11–307)

## 2023-05-30 MED ORDER — SODIUM CHLORIDE 0.9 % IV SOLN
200.0000 mg | Freq: Once | INTRAVENOUS | Status: AC
  Administered 2023-05-30: 200 mg via INTRAVENOUS
  Filled 2023-05-30: qty 8

## 2023-05-30 MED ORDER — SODIUM CHLORIDE 0.9 % IV SOLN: Freq: Once | INTRAVENOUS | Status: AC

## 2023-05-30 MED ORDER — HEPARIN SOD (PORK) LOCK FLUSH 100 UNIT/ML IV SOLN
500.0000 [IU] | Freq: Once | INTRAVENOUS | Status: AC | PRN
  Administered 2023-05-30: 500 [IU]

## 2023-05-30 MED ORDER — SODIUM CHLORIDE 0.9% FLUSH
10.0000 mL | INTRAVENOUS | Status: DC | PRN
  Administered 2023-05-30: 10 mL

## 2023-05-30 NOTE — Patient Instructions (Signed)

## 2023-05-30 NOTE — Addendum Note (Signed)
Addended by: Arlan Organ R on: 05/30/2023 12:32 PM   Modules accepted: Orders

## 2023-05-30 NOTE — Progress Notes (Signed)
Hematology and Oncology Follow Up Visit  SAYOKO KORMAN [id number] June 28, 1960 62 y.o. 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 Ahumada is here today for follow-up.  She continues to do well.  Appetite and weight are stable.  She is not having any fevers, chills, chest pain, shortness of breath, abdominal pain, nausea, vomiting.  She reports mild, intermittent diarrhea and uses Imodium.  However, this is not a persistent issue for her.  Bleeding reported.  She remains on Coumadin.  She does bruise easily due to being on Coumadin.  Her last inhibin B formed on 05/09/2023 was down to 38.6.  It has been going up and down.  Overall, I would say that her performance status is probably ECOG 1.     Medications:  Allergies as of 05/30/2023       Reactions   Augmentin [amoxicillin-pot Clavulanate] Other (See Comments)   Muscle Aches   Prochlorperazine Edisylate Other (See Comments)   Nervous/ flutter/ shakes   Adhesive [tape] Hives   Redness/hives from adhesive tape( tolerates latex gloves); tolerates paper tape        Medication List        Accurate as of May 30, 2023 12:19 PM. If you have any questions, ask your nurse or doctor.          albuterol 108 (90 Base) MCG/ACT inhaler Commonly known as: VENTOLIN HFA TAKE 2 PUFFS BY MOUTH EVERY 6 HOURS AS NEEDED FOR WHEEZE OR SHORTNESS OF BREATH   amoxicillin 500 MG tablet Commonly known as: AMOXIL Take 2,000 mg by mouth See admin instructions. Take 2,000 mg one hour prior to dental visits.   aspirin EC 81 MG tablet Take 81 mg by mouth daily.   b complex vitamins capsule Take 1 capsule by mouth 2 (two) times  daily.   carvedilol 3.125 MG tablet Commonly known as: COREG TAKE ONE TABLET BY MOUTH TWICE DAILY WITH A MEAL   cholecalciferol 25 MCG (1000 UNIT) tablet Commonly known as: VITAMIN D3 Take 4,000 Units by mouth daily.   furosemide 20 MG tablet Commonly known as: LASIX TAKE ONE TABLET BY MOUTH EVERY DAY   levothyroxine 100 MCG tablet Commonly known as: Synthroid Take 1 tablet (100 mcg total) by mouth daily before breakfast.   lidocaine-prilocaine cream Commonly known as: EMLA Apply 1 Application topically as needed. Apply to port per instructions   loperamide 2 MG tablet Commonly known as: IMODIUM A-D Take 4 mg by mouth 3 (three) times daily as needed for diarrhea or loose stools.   LORazepam 1 MG tablet Commonly known as: ATIVAN TAKE ONE TABLET BY MOUTH EVERY 8 HOURS AS NEEDED FOR ANXIETY   methocarbamol 500 MG tablet Commonly known as: ROBAXIN TAKE ONE TABLET BY MOUTH EVERY 6 HOURS AS NEEDED FOR MUSCLE SPASMS   Potassium Chloride ER 20 MEQ Tbcr Take 1 tablet (20 mEq total) by mouth daily.   sodium chloride 0.9 % SOLN 50 mL with pembrolizumab 100 MG/4ML SOLN 200 mg Inject 200 mg into the vein every 21 ( twenty-one) days.   spironolactone 25 MG tablet Commonly known as: ALDACTONE Take 1 tablet (25 mg total) by  mouth daily.   traMADol 50 MG tablet Commonly known as: ULTRAM TAKE ONE TABLET BY MOUTH EVERY 6 HOURS AS NEEDED   Vitamin D (Ergocalciferol) 1.25 MG (50000 UNIT) Caps capsule Commonly known as: DRISDOL TAKE ONE CAPSULE BY MOUTH every 7 DAYS   warfarin 7.5 MG tablet Commonly known as: COUMADIN Take as directed by the anticoagulation clinic. If you are unsure how to take this medication, talk to your nurse or doctor. Original instructions: Take 1/2 tablet to 1 tablet by mouth daily as directed by Anticoagulation Clinic.   zolpidem 10 MG tablet Commonly known as: AMBIEN TAKE ONE TABLET BY MOUTH EVERY DAY        Allergies:  Allergies  Allergen  Reactions   Augmentin [Amoxicillin-Pot Clavulanate] Other (See Comments)    Muscle Aches   Prochlorperazine Edisylate Other (See Comments)    Nervous/ flutter/ shakes   Adhesive [Tape] Hives    Redness/hives from adhesive tape( tolerates latex gloves); tolerates paper tape    Past Medical History, Surgical history, Social history, and Family History were reviewed and updated.  Review of Systems:  Review of Systems  Constitutional: Negative.   HENT: Negative.    Eyes: Negative.   Respiratory: Negative.    Cardiovascular: Negative.   Gastrointestinal: Negative.   Genitourinary: Negative.   Musculoskeletal: Negative.   Skin: Negative.   Neurological: Negative.   Endo/Heme/Allergies: Negative.   Psychiatric/Behavioral: Negative.        Exam:  height is 5\' 6"  (1.676 m) and weight is 75.3 kg. Her oral temperature is 97.9 F (36.6 C). Her blood pressure is 107/58 (abnormal) and her pulse is 71. Her respiration is 19 and oxygen saturation is 100%.   Wt Readings from Last 3 Encounters:  05/30/23 75.3 kg  05/09/23 75.3 kg  03/28/23 74.4 kg    Physical Exam Vitals reviewed.  HENT:     Head: Normocephalic and atraumatic.  Eyes:     Pupils: Pupils are equal, round, and reactive to light.  Cardiovascular:     Rate and Rhythm: Normal rate and regular rhythm.     Heart sounds: Normal heart sounds.  Pulmonary:     Effort: Pulmonary effort is normal.     Breath sounds: Normal breath sounds.  Abdominal:     General: Bowel sounds are normal.     Palpations: Abdomen is soft.  Musculoskeletal:        General: No tenderness or deformity. Normal range of motion.     Cervical back: Normal range of motion.  Lymphadenopathy:     Cervical: No cervical adenopathy.  Skin:    General: Skin is warm and dry.     Findings: No erythema or rash.  Neurological:     Mental Status: She is alert and oriented to person, place, and time.  Psychiatric:        Behavior: Behavior normal.         Thought Content: Thought content normal.        Judgment: Judgment normal.      Lab Results  Component Value Date   WBC 7.9 05/30/2023   HGB 10.5 (L) 05/30/2023   HCT 31.3 (L) 05/30/2023   MCV 90.7 05/30/2023   PLT 202 05/30/2023   Lab Results  Component Value Date   FERRITIN 14 05/09/2023   IRON 68 05/09/2023   TIBC 377 05/09/2023   UIBC 309 05/09/2023   IRONPCTSAT 18 05/09/2023   Lab Results  Component Value Date   RETICCTPCT 1.9 11/14/2022  RBC 3.45 (L) 05/30/2023   No results found for: "KPAFRELGTCHN", "LAMBDASER", "KAPLAMBRATIO" No results found for: "IGGSERUM", "IGA", "IGMSERUM" No results found for: "TOTALPROTELP", "ALBUMINELP", "A1GS", "A2GS", "BETS", "BETA2SER", "GAMS", "MSPIKE", "SPEI"   Chemistry      Component Value Date/Time   NA 139 05/30/2023 1120   NA 139 08/14/2020 1428   NA 140 03/15/2016 0953   K 3.5 05/30/2023 1120   K 3.9 03/15/2016 0953   CL 104 05/30/2023 1120   CL 102 12/13/2015 0803   CO2 27 05/30/2023 1120   CO2 19 (L) 03/15/2016 0953   BUN 9 05/30/2023 1120   BUN 16 08/14/2020 1428   BUN 15.9 03/15/2016 0953   CREATININE 1.03 (H) 05/30/2023 1120   CREATININE 1.4 (H) 03/15/2016 0953      Component Value Date/Time   CALCIUM 8.5 (L) 05/30/2023 1120   CALCIUM 9.3 03/15/2016 0953   ALKPHOS 40 05/30/2023 1120   ALKPHOS 55 03/15/2016 0953   AST 13 (L) 05/30/2023 1120   AST 17 03/15/2016 0953   ALT 8 05/30/2023 1120   ALT 23 03/15/2016 0953   BILITOT 0.3 05/30/2023 1120   BILITOT 0.38 03/15/2016 0953       Impression and Plan: Ms. Jerome is a very pleasant 62 yo caucasian female with recurrent granulosa cell tumor.  We now have her on immunotherapy.  We are trying to avoid systemic chemotherapy if possible.  Hopefully, we will continue to see that her disease is stable.  Again, the Inhibin B is certainly a marker that we can use.  Her last inhibin B remained elevated but had decreased down to 238.6.  Will continue to watch  this.  Labs from today have been reviewed hemoglobin is down slightly at 10.5.  We will continue to watch this.  Her ferritin and iron studies are pending.  She will proceed with [facility] as scheduled.  She will follow-up with [place] 3 weeks for repeat lab work, office visit, [name].   Clenton Pare, NP 12/6/202412:19 PM

## 2023-06-02 ENCOUNTER — Encounter: Payer: Self-pay | Admitting: *Deleted

## 2023-06-02 LAB — INHIBIN B: Inhibin B: 159.3 pg/mL — ABNORMAL HIGH (ref 0.0–16.9)

## 2023-06-04 ENCOUNTER — Ambulatory Visit: Payer: 59

## 2023-06-10 ENCOUNTER — Other Ambulatory Visit: Payer: Self-pay | Admitting: Hematology & Oncology

## 2023-06-10 ENCOUNTER — Inpatient Hospital Stay: Payer: 59

## 2023-06-10 VITALS — BP 106/42 | HR 71 | Temp 98.3°F | Resp 17

## 2023-06-10 DIAGNOSIS — T451X5A Adverse effect of antineoplastic and immunosuppressive drugs, initial encounter: Secondary | ICD-10-CM

## 2023-06-10 DIAGNOSIS — Z5112 Encounter for antineoplastic immunotherapy: Secondary | ICD-10-CM | POA: Diagnosis not present

## 2023-06-10 DIAGNOSIS — F419 Anxiety disorder, unspecified: Secondary | ICD-10-CM

## 2023-06-10 MED ORDER — SODIUM CHLORIDE 0.9 % IV SOLN
Freq: Once | INTRAVENOUS | Status: AC
Start: 2023-06-10 — End: 2023-06-10

## 2023-06-10 MED ORDER — SODIUM CHLORIDE 0.9% FLUSH
10.0000 mL | Freq: Once | INTRAVENOUS | Status: AC
Start: 2023-06-10 — End: 2023-06-10
  Administered 2023-06-10: 10 mL via INTRAVENOUS

## 2023-06-10 MED ORDER — HEPARIN SOD (PORK) LOCK FLUSH 100 UNIT/ML IV SOLN
500.0000 [IU] | Freq: Once | INTRAVENOUS | Status: AC
Start: 2023-06-10 — End: 2023-06-10
  Administered 2023-06-10: 500 [IU] via INTRAVENOUS

## 2023-06-10 MED ORDER — SODIUM CHLORIDE 0.9 % IV SOLN
125.0000 mg | Freq: Once | INTRAVENOUS | Status: AC
Start: 2023-06-10 — End: 2023-06-10
  Administered 2023-06-10: 125 mg via INTRAVENOUS
  Filled 2023-06-10: qty 10

## 2023-06-10 NOTE — Patient Instructions (Signed)
Sodium Ferric Gluconate Complex Injection What is this medication? SODIUM FERRIC GLUCONATE COMPLEX (SOE dee um FER ik GLOO koe nate KOM pleks) treats low levels of iron (iron deficiency anemia) in people with kidney disease. Iron is a mineral that plays an important role in making red blood cells, which carry oxygen from your lungs to the rest of your body. This medicine may be used for other purposes; ask your health care provider or pharmacist if you have questions. COMMON BRAND NAME(S): Ferrlecit, Nulecit What should I tell my care team before I take this medication? They need to know if you have any of the following conditions: Anemia that is not from iron deficiency High levels of iron in the blood An unusual or allergic reaction to iron, other medications, foods, dyes, or preservatives Pregnant or are trying to become pregnant Breast-feeding How should I use this medication? This medication is injected into a vein. It is given by your care team in a hospital or clinic setting. Talk to your care team about the use of this medication in children. While it may be prescribed for children as young as 6 years for selected conditions, precautions do apply. Overdosage: If you think you have taken too much of this medicine contact a poison control center or emergency room at once. NOTE: This medicine is only for you. Do not share this medicine with others. What if I miss a dose? It is important not to miss your dose. Call your care team if you are unable to keep an appointment. What may interact with this medication? Do not take this medication with any of the following: Deferasirox Deferoxamine Dimercaprol This medication may also interact with the following: Other iron products This list may not describe all possible interactions. Give your health care provider a list of all the medicines, herbs, non-prescription drugs, or dietary supplements you use. Also tell them if you smoke, drink  alcohol, or use illegal drugs. Some items may interact with your medicine. What should I watch for while using this medication? Your condition will be monitored carefully while you are receiving this medication. Visit your care team for regular checks on your progress. You may need blood work while you are taking this medication. What side effects may I notice from receiving this medication? Side effects that you should report to your care team as soon as possible: Allergic reactions--skin rash, itching, hives, swelling of the face, lips, tongue, or throat Low blood pressure--dizziness, feeling faint or lightheaded, blurry vision Shortness of breath Side effects that usually do not require medical attention (report to your care team if they continue or are bothersome): Flushing Headache Joint pain Muscle pain Nausea Pain, redness, or irritation at injection site This list may not describe all possible side effects. Call your doctor for medical advice about side effects. You may report side effects to FDA at 1-800-FDA-1088. Where should I keep my medication? This medication is given in a hospital or clinic and will not be stored at home. NOTE: This sheet is a summary. It may not cover all possible information. If you have questions about this medicine, talk to your doctor, pharmacist, or health care provider.  2024 Elsevier/Gold Standard (2020-11-03 00:00:00)

## 2023-06-13 ENCOUNTER — Other Ambulatory Visit: Payer: Self-pay | Admitting: Hematology & Oncology

## 2023-06-13 ENCOUNTER — Ambulatory Visit: Payer: 59 | Attending: Cardiovascular Disease | Admitting: *Deleted

## 2023-06-13 DIAGNOSIS — Z5181 Encounter for therapeutic drug level monitoring: Secondary | ICD-10-CM | POA: Diagnosis not present

## 2023-06-13 DIAGNOSIS — Z953 Presence of xenogenic heart valve: Secondary | ICD-10-CM | POA: Diagnosis not present

## 2023-06-13 DIAGNOSIS — D3910 Neoplasm of uncertain behavior of unspecified ovary: Secondary | ICD-10-CM

## 2023-06-13 DIAGNOSIS — Z952 Presence of prosthetic heart valve: Secondary | ICD-10-CM | POA: Diagnosis not present

## 2023-06-13 DIAGNOSIS — I480 Paroxysmal atrial fibrillation: Secondary | ICD-10-CM | POA: Diagnosis not present

## 2023-06-13 LAB — POCT INR: INR: 3.8 — AB (ref 2.0–3.0)

## 2023-06-13 NOTE — Patient Instructions (Addendum)
Description   NO PRINTOUT. Do not take any warfarin today then continue taking warfarin 1 tablet daily except for 1/2 tablet on Mondays, Wednesdays and Fridays.  Recheck INR in 5 weeks.  Coumadin Clinic [phone]

## 2023-06-20 ENCOUNTER — Inpatient Hospital Stay: Payer: 59

## 2023-06-20 ENCOUNTER — Encounter: Payer: Self-pay | Admitting: Hematology & Oncology

## 2023-06-20 ENCOUNTER — Inpatient Hospital Stay (HOSPITAL_BASED_OUTPATIENT_CLINIC_OR_DEPARTMENT_OTHER): Payer: 59 | Admitting: Hematology & Oncology

## 2023-06-20 ENCOUNTER — Other Ambulatory Visit: Payer: Self-pay

## 2023-06-20 VITALS — BP 110/58 | HR 84 | Temp 98.0°F | Resp 18 | Ht 66.0 in | Wt 168.0 lb

## 2023-06-20 DIAGNOSIS — D3910 Neoplasm of uncertain behavior of unspecified ovary: Secondary | ICD-10-CM

## 2023-06-20 DIAGNOSIS — Z5112 Encounter for antineoplastic immunotherapy: Secondary | ICD-10-CM | POA: Diagnosis not present

## 2023-06-20 LAB — CMP (CANCER CENTER ONLY)
ALT: 7 U/L (ref 0–44)
AST: 12 U/L — ABNORMAL LOW (ref 15–41)
Albumin: 3.6 g/dL (ref 3.5–5.0)
Alkaline Phosphatase: 42 U/L (ref 38–126)
Anion gap: 8 (ref 5–15)
BUN: 10 mg/dL (ref 8–23)
CO2: 28 mmol/L (ref 22–32)
Calcium: 8.4 mg/dL — ABNORMAL LOW (ref 8.9–10.3)
Chloride: 103 mmol/L (ref 98–111)
Creatinine: 1.07 mg/dL — ABNORMAL HIGH (ref 0.44–1.00)
GFR, Estimated: 58 mL/min — ABNORMAL LOW (ref >60)
Glucose, Bld: 90 mg/dL (ref 70–99)
Potassium: 3.4 mmol/L — ABNORMAL LOW (ref 3.5–5.1)
Sodium: 139 mmol/L (ref 135–145)
Total Bilirubin: 0.3 mg/dL (ref <1.2)
Total Protein: 5.6 g/dL — ABNORMAL LOW (ref 6.5–8.1)

## 2023-06-20 LAB — CBC WITH DIFFERENTIAL (CANCER CENTER ONLY)
Abs Immature Granulocytes: 0.03 10*3/uL (ref 0.00–0.07)
Basophils Absolute: 0 10*3/uL (ref 0.0–0.1)
Basophils Relative: 0 %
Eosinophils Absolute: 0.1 10*3/uL (ref 0.0–0.5)
Eosinophils Relative: 1 %
HCT: 31 % — ABNORMAL LOW (ref 36.0–46.0)
Hemoglobin: 10.4 g/dL — ABNORMAL LOW (ref 12.0–15.0)
Immature Granulocytes: 0 %
Lymphocytes Relative: 6 %
Lymphs Abs: 0.5 10*3/uL — ABNORMAL LOW (ref 0.7–4.0)
MCH: 30.4 pg (ref 26.0–34.0)
MCHC: 33.5 g/dL (ref 30.0–36.0)
MCV: 90.6 fL (ref 80.0–100.0)
Monocytes Absolute: 0.7 10*3/uL (ref 0.1–1.0)
Monocytes Relative: 8 %
Neutro Abs: 7.2 10*3/uL (ref 1.7–7.7)
Neutrophils Relative %: 85 %
Platelet Count: 210 10*3/uL (ref 150–400)
RBC: 3.42 MIL/uL — ABNORMAL LOW (ref 3.87–5.11)
RDW: 13.7 % (ref 11.5–15.5)
WBC Count: 8.6 10*3/uL (ref 4.0–10.5)
nRBC: 0 % (ref 0.0–0.2)

## 2023-06-20 LAB — TSH: TSH: 1.027 u[IU]/mL (ref 0.350–4.500)

## 2023-06-20 MED ORDER — SODIUM CHLORIDE 0.9 % IV SOLN: Freq: Once | INTRAVENOUS | Status: AC

## 2023-06-20 MED ORDER — PEMBROLIZUMAB CHEMO INJECTION 100 MG/4ML
200.0000 mg | Freq: Once | INTRAVENOUS | Status: AC
  Administered 2023-06-20: 200 mg via INTRAVENOUS
  Filled 2023-06-20: qty 8

## 2023-06-20 MED ORDER — HEPARIN SOD (PORK) LOCK FLUSH 100 UNIT/ML IV SOLN
500.0000 [IU] | Freq: Once | INTRAVENOUS | Status: AC | PRN
  Administered 2023-06-20: 500 [IU]

## 2023-06-20 MED ORDER — SODIUM CHLORIDE 0.9% FLUSH
10.0000 mL | INTRAVENOUS | Status: DC | PRN
Start: 2023-06-20 — End: 2023-06-20
  Administered 2023-06-20: 10 mL

## 2023-06-20 NOTE — Patient Instructions (Signed)

## 2023-06-20 NOTE — Progress Notes (Signed)
Hematology and Oncology Follow Up Visit  [name] [id number] Jul 14, 1960 62 y.o. 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 is here today for follow-up.  She is doing pretty well.  She really has no specific complaints.  She is doing well with the pembrolizumab.  We follow her Inhibin B.  Thankfully, the last level was down to 158.  She has had no cough or shortness of breath.  She is on Coumadin because of her cardiac valve.  Her last INR was 3.8.  She has had no change in bowel or bladder habits..  She still has a little bit of anemia.  Of note, when we checked her erythropoietin level it was 60.  Her last iron level was 50% saturation.  She did get a dose of IV iron.  She has had no bleeding..  There is been no headache..  She is still working.  Her appetite has been good.  She has had no nausea or vomiting.  Overall, I would say that her performance status is probably ECOG 1.    Medications:  Allergies as of 06/20/2023       Reactions   Augmentin [amoxicillin-pot Clavulanate] Other (See Comments)   Muscle Aches   Prochlorperazine Edisylate Other (See Comments)   Nervous/ flutter/ shakes   Adhesive [tape] Hives   Redness/hives from adhesive tape( tolerates latex gloves); tolerates paper tape        Medication List        Accurate as of June 20, 2023 10:57 AM. If you have any questions, ask your nurse or doctor.          albuterol 108 (90 Base) MCG/ACT inhaler Commonly known as: VENTOLIN HFA TAKE 2 PUFFS BY MOUTH EVERY 6 HOURS AS NEEDED FOR WHEEZE OR SHORTNESS OF BREATH   amoxicillin 500 MG tablet Commonly known as: AMOXIL Take  2,000 mg by mouth See admin instructions. Take 2,000 mg one hour prior to dental visits.   aspirin EC 81 MG tablet Take 81 mg by mouth daily.   b complex vitamins capsule Take 1 capsule by mouth 2 (two) times daily.   carvedilol 3.125 MG tablet Commonly known as: COREG TAKE ONE TABLET BY MOUTH TWICE DAILY WITH A MEAL   cholecalciferol 25 MCG (1000 UNIT) tablet Commonly known as: VITAMIN D3 Take 4,000 Units by mouth daily.   furosemide 20 MG tablet Commonly known as: LASIX TAKE ONE TABLET BY MOUTH EVERY DAY   levothyroxine 100 MCG tablet Commonly known as: Synthroid Take 1 tablet (100 mcg total) by mouth daily before breakfast.   lidocaine-prilocaine cream Commonly known as: EMLA Apply 1 Application topically as needed. Apply to port per instructions   loperamide 2 MG tablet Commonly known as: IMODIUM A-D Take 4 mg by mouth 3 (three) times daily as needed for diarrhea or loose stools.   LORazepam 1 MG tablet Commonly known as: ATIVAN TAKE ONE TABLET BY MOUTH EVERY 8 HOURS AS NEEDED FOR ANXIETY   methocarbamol 500 MG tablet Commonly known as: ROBAXIN TAKE ONE TABLET BY MOUTH EVERY 6 HOURS AS NEEDED FOR MUSCLE SPASMS   Potassium Chloride ER  20 MEQ Tbcr Take 1 tablet (20 mEq total) by mouth daily.   potassium chloride SA 20 MEQ tablet Commonly known as: KLOR-CON M Take 20 mEq by mouth daily.   sodium chloride 0.9 % SOLN 50 mL with pembrolizumab 100 MG/4ML SOLN 200 mg Inject 200 mg into the vein every 21 ( twenty-one) days.   spironolactone 25 MG tablet Commonly known as: ALDACTONE Take 1 tablet (25 mg total) by mouth daily.   traMADol 50 MG tablet Commonly known as: ULTRAM TAKE ONE TABLET BY MOUTH EVERY 6 HOURS AS NEEDED   Vitamin D (Ergocalciferol) 1.25 MG (50000 UNIT) Caps capsule Commonly known as: DRISDOL TAKE ONE CAPSULE BY MOUTH every 7 DAYS   warfarin 7.5 MG tablet Commonly known as: COUMADIN Take as directed by the anticoagulation clinic. If you are  unsure how to take this medication, talk to your nurse or doctor. Original instructions: Take 1/2 tablet to 1 tablet by mouth daily as directed by Anticoagulation Clinic.   zolpidem 10 MG tablet Commonly known as: AMBIEN TAKE ONE TABLET BY MOUTH EVERY DAY        Allergies:  Allergies  Allergen Reactions   Augmentin [Amoxicillin-Pot Clavulanate] Other (See Comments)    Muscle Aches   Prochlorperazine Edisylate Other (See Comments)    Nervous/ flutter/ shakes   Adhesive [Tape] Hives    Redness/hives from adhesive tape( tolerates latex gloves); tolerates paper tape    Past Medical History, Surgical history, Social history, and Family History were reviewed and updated.  Review of Systems:  Review of Systems  Constitutional: Negative.   HENT: Negative.    Eyes: Negative.   Respiratory: Negative.    Cardiovascular: Negative.   Gastrointestinal: Negative.   Genitourinary: Negative.   Musculoskeletal: Negative.   Skin: Negative.   Neurological: Negative.   Endo/Heme/Allergies: Negative.   Psychiatric/Behavioral: Negative.        Exam:  height is 5\' 6"  (1.676 m) and weight is 168 lb (76.2 kg). Her oral temperature is 98 F (36.7 C). Her blood pressure is 110/58 (abnormal) and her pulse is 84. Her respiration is 18 and oxygen saturation is 100%.   Wt Readings from Last 3 Encounters:  06/20/23 168 lb (76.2 kg)  05/30/23 166 lb (75.3 kg)  05/09/23 166 lb (75.3 kg)    Physical Exam Vitals reviewed.  HENT:     Head: Normocephalic and atraumatic.  Eyes:     Pupils: Pupils are equal, round, and reactive to light.  Cardiovascular:     Rate and Rhythm: Normal rate and regular rhythm.     Heart sounds: Normal heart sounds.  Pulmonary:     Effort: Pulmonary effort is normal.     Breath sounds: Normal breath sounds.  Abdominal:     General: Bowel sounds are normal.     Palpations: Abdomen is soft.  Musculoskeletal:        General: No tenderness or deformity. Normal  range of motion.     Cervical back: Normal range of motion.  Lymphadenopathy:     Cervical: No cervical adenopathy.  Skin:    General: Skin is warm and dry.     Findings: No erythema or rash.  Neurological:     Mental Status: She is alert and oriented to person, place, and time.  Psychiatric:        Behavior: Behavior normal.        Thought Content: Thought content normal.        Judgment: Judgment normal.  Lab Results  Component Value Date   WBC 8.6 06/20/2023   HGB 10.4 (L) 06/20/2023   HCT 31.0 (L) 06/20/2023   MCV 90.6 06/20/2023   PLT 210 06/20/2023   Lab Results  Component Value Date   FERRITIN 27 05/30/2023   IRON 56 05/30/2023   TIBC 384 05/30/2023   UIBC 328 05/30/2023   IRONPCTSAT 15 05/30/2023   Lab Results  Component Value Date   RETICCTPCT 1.9 11/14/2022   RBC 3.42 (L) 06/20/2023   No results found for: "KPAFRELGTCHN", "LAMBDASER", "KAPLAMBRATIO" No results found for: "IGGSERUM", "IGA", "IGMSERUM" No results found for: "TOTALPROTELP", "ALBUMINELP", "A1GS", "A2GS", "BETS", "BETA2SER", "GAMS", "MSPIKE", "SPEI"   Chemistry      Component Value Date/Time   NA 139 06/20/2023 1015   NA 139 08/14/2020 1428   NA 140 03/15/2016 0953   K 3.4 (L) 06/20/2023 1015   K 3.9 03/15/2016 0953   CL 103 06/20/2023 1015   CL 102 12/13/2015 0803   CO2 28 06/20/2023 1015   CO2 19 (L) 03/15/2016 0953   BUN 10 06/20/2023 1015   BUN 16 08/14/2020 1428   BUN 15.9 03/15/2016 0953   CREATININE 1.07 (H) 06/20/2023 1015   CREATININE 1.4 (H) 03/15/2016 0953      Component Value Date/Time   CALCIUM 8.4 (L) 06/20/2023 1015   CALCIUM 9.3 03/15/2016 0953   ALKPHOS 42 06/20/2023 1015   ALKPHOS 55 03/15/2016 0953   AST 12 (L) 06/20/2023 1015   AST 17 03/15/2016 0953   ALT 7 06/20/2023 1015   ALT 23 03/15/2016 0953   BILITOT 0.3 06/20/2023 1015   BILITOT 0.38 03/15/2016 0953       Impression and Plan: Ms. Elbaz is a very pleasant 62 yo caucasian female with  recurrent granulosa cell tumor.  We now have her on immunotherapy.  We are trying to avoid systemic chemotherapy if possible.  Hopefully, we will continue to see that her disease is stable.  Again, the Inhibin B is certainly a marker that we can use.  We will go ahead with her on the 12th treatment today.  I am just glad that the performance status is quite good for her.  Her quality of life is also doing quite well.  She is enjoying her [place].  I will have her come by to see [place] in another 3 weeks.  Again, I think other option that we have for her might be to use is the FGFR2 inhibitor - erdatifinib.   Josph Macho, MD 12/27/202410:57 AM

## 2023-06-21 LAB — T4: T4, Total: 9 ug/dL (ref 4.5–12.0)

## 2023-06-27 ENCOUNTER — Other Ambulatory Visit: Payer: Self-pay | Admitting: Hematology & Oncology

## 2023-06-27 ENCOUNTER — Other Ambulatory Visit: Payer: Self-pay | Admitting: Physician Assistant

## 2023-06-27 DIAGNOSIS — D3910 Neoplasm of uncertain behavior of unspecified ovary: Secondary | ICD-10-CM

## 2023-06-27 DIAGNOSIS — I480 Paroxysmal atrial fibrillation: Secondary | ICD-10-CM

## 2023-06-27 NOTE — Telephone Encounter (Signed)
 Warfarin 7.5mg  refill S/P mitral valve replacement with bioprosthetic valve  Last INR 06/13/23 Last OV 10/16/22

## 2023-07-01 ENCOUNTER — Ambulatory Visit (INDEPENDENT_AMBULATORY_CARE_PROVIDER_SITE_OTHER): Payer: 59

## 2023-07-01 DIAGNOSIS — I5022 Chronic systolic (congestive) heart failure: Secondary | ICD-10-CM | POA: Diagnosis not present

## 2023-07-01 DIAGNOSIS — I255 Ischemic cardiomyopathy: Secondary | ICD-10-CM

## 2023-07-02 LAB — CUP PACEART REMOTE DEVICE CHECK
Battery Remaining Longevity: 73 mo
Battery Remaining Percentage: 70 %
Battery Voltage: 2.99 V
Brady Statistic AP VP Percent: 1 %
Brady Statistic AP VS Percent: 3.6 %
Brady Statistic AS VP Percent: 1 %
Brady Statistic AS VS Percent: 94 %
Brady Statistic RA Percent Paced: 1 %
Brady Statistic RV Percent Paced: 1 %
Date Time Interrogation Session: 20250107010049
HighPow Impedance: 61 Ohm
Implantable Lead Connection Status: 753985
Implantable Lead Connection Status: 753985
Implantable Lead Implant Date: 20110503
Implantable Lead Implant Date: 20130815
Implantable Lead Location: 753859
Implantable Lead Location: 753860
Implantable Pulse Generator Implant Date: 20211012
Lead Channel Impedance Value: 250 Ohm
Lead Channel Impedance Value: 330 Ohm
Lead Channel Pacing Threshold Amplitude: 0.5 V
Lead Channel Pacing Threshold Amplitude: 1.25 V
Lead Channel Pacing Threshold Pulse Width: 0.5 ms
Lead Channel Pacing Threshold Pulse Width: 1 ms
Lead Channel Sensing Intrinsic Amplitude: 1.5 mV
Lead Channel Sensing Intrinsic Amplitude: 11.9 mV
Lead Channel Setting Pacing Amplitude: 1.5 V
Lead Channel Setting Pacing Amplitude: 2 V
Lead Channel Setting Pacing Pulse Width: 1 ms
Lead Channel Setting Sensing Sensitivity: 0.5 mV
Pulse Gen Serial Number: 111028151
Zone Setting Status: 755011

## 2023-07-10 ENCOUNTER — Other Ambulatory Visit: Payer: Self-pay | Admitting: Hematology & Oncology

## 2023-07-10 DIAGNOSIS — D3910 Neoplasm of uncertain behavior of unspecified ovary: Secondary | ICD-10-CM

## 2023-07-10 DIAGNOSIS — F419 Anxiety disorder, unspecified: Secondary | ICD-10-CM

## 2023-07-11 ENCOUNTER — Inpatient Hospital Stay: Payer: 59 | Admitting: Hematology & Oncology

## 2023-07-11 ENCOUNTER — Inpatient Hospital Stay: Payer: 59

## 2023-07-11 ENCOUNTER — Inpatient Hospital Stay: Payer: 59 | Attending: Hematology & Oncology

## 2023-07-11 DIAGNOSIS — C786 Secondary malignant neoplasm of retroperitoneum and peritoneum: Secondary | ICD-10-CM | POA: Insufficient documentation

## 2023-07-11 DIAGNOSIS — Z5112 Encounter for antineoplastic immunotherapy: Secondary | ICD-10-CM | POA: Insufficient documentation

## 2023-07-11 DIAGNOSIS — K909 Intestinal malabsorption, unspecified: Secondary | ICD-10-CM | POA: Insufficient documentation

## 2023-07-11 DIAGNOSIS — D508 Other iron deficiency anemias: Secondary | ICD-10-CM | POA: Insufficient documentation

## 2023-07-11 DIAGNOSIS — C569 Malignant neoplasm of unspecified ovary: Secondary | ICD-10-CM | POA: Insufficient documentation

## 2023-07-11 DIAGNOSIS — Z952 Presence of prosthetic heart valve: Secondary | ICD-10-CM | POA: Insufficient documentation

## 2023-07-11 DIAGNOSIS — Z7901 Long term (current) use of anticoagulants: Secondary | ICD-10-CM | POA: Insufficient documentation

## 2023-07-17 ENCOUNTER — Other Ambulatory Visit: Payer: Self-pay | Admitting: Hematology & Oncology

## 2023-07-18 ENCOUNTER — Ambulatory Visit: Payer: 59

## 2023-07-21 ENCOUNTER — Inpatient Hospital Stay: Payer: 59

## 2023-07-21 ENCOUNTER — Encounter: Payer: Self-pay | Admitting: Hematology & Oncology

## 2023-07-21 ENCOUNTER — Encounter: Payer: Self-pay | Admitting: Medical Oncology

## 2023-07-21 ENCOUNTER — Inpatient Hospital Stay (HOSPITAL_BASED_OUTPATIENT_CLINIC_OR_DEPARTMENT_OTHER): Payer: 59 | Admitting: Medical Oncology

## 2023-07-21 VITALS — BP 103/53 | HR 67 | Temp 98.4°F | Resp 18

## 2023-07-21 VITALS — BP 111/52 | HR 62 | Temp 98.4°F | Resp 18 | Ht 66.0 in | Wt 165.0 lb

## 2023-07-21 DIAGNOSIS — D6481 Anemia due to antineoplastic chemotherapy: Secondary | ICD-10-CM

## 2023-07-21 DIAGNOSIS — D500 Iron deficiency anemia secondary to blood loss (chronic): Secondary | ICD-10-CM

## 2023-07-21 DIAGNOSIS — C569 Malignant neoplasm of unspecified ovary: Secondary | ICD-10-CM | POA: Diagnosis present

## 2023-07-21 DIAGNOSIS — Z7901 Long term (current) use of anticoagulants: Secondary | ICD-10-CM | POA: Diagnosis not present

## 2023-07-21 DIAGNOSIS — D3910 Neoplasm of uncertain behavior of unspecified ovary: Secondary | ICD-10-CM

## 2023-07-21 DIAGNOSIS — Z95828 Presence of other vascular implants and grafts: Secondary | ICD-10-CM | POA: Diagnosis not present

## 2023-07-21 DIAGNOSIS — K909 Intestinal malabsorption, unspecified: Secondary | ICD-10-CM | POA: Diagnosis not present

## 2023-07-21 DIAGNOSIS — T451X5A Adverse effect of antineoplastic and immunosuppressive drugs, initial encounter: Secondary | ICD-10-CM

## 2023-07-21 DIAGNOSIS — Z952 Presence of prosthetic heart valve: Secondary | ICD-10-CM | POA: Diagnosis not present

## 2023-07-21 DIAGNOSIS — C786 Secondary malignant neoplasm of retroperitoneum and peritoneum: Secondary | ICD-10-CM | POA: Diagnosis present

## 2023-07-21 DIAGNOSIS — D508 Other iron deficiency anemias: Secondary | ICD-10-CM | POA: Diagnosis not present

## 2023-07-21 DIAGNOSIS — Z5112 Encounter for antineoplastic immunotherapy: Secondary | ICD-10-CM | POA: Diagnosis present

## 2023-07-21 LAB — CMP (CANCER CENTER ONLY)
ALT: 6 U/L (ref 0–44)
AST: 10 U/L — ABNORMAL LOW (ref 15–41)
Albumin: 3.6 g/dL (ref 3.5–5.0)
Alkaline Phosphatase: 46 U/L (ref 38–126)
Anion gap: 8 (ref 5–15)
BUN: 7 mg/dL — ABNORMAL LOW (ref 8–23)
CO2: 29 mmol/L (ref 22–32)
Calcium: 8.3 mg/dL — ABNORMAL LOW (ref 8.9–10.3)
Chloride: 103 mmol/L (ref 98–111)
Creatinine: 0.96 mg/dL (ref 0.44–1.00)
GFR, Estimated: >60 mL/min (ref >60)
Glucose, Bld: 89 mg/dL (ref 70–99)
Potassium: 3.1 mmol/L — ABNORMAL LOW (ref 3.5–5.1)
Sodium: 140 mmol/L (ref 135–145)
Total Bilirubin: 0.4 mg/dL (ref 0.0–1.2)
Total Protein: 5.5 g/dL — ABNORMAL LOW (ref 6.5–8.1)

## 2023-07-21 LAB — CBC WITH DIFFERENTIAL (CANCER CENTER ONLY)
Abs Immature Granulocytes: 0.15 10*3/uL — ABNORMAL HIGH (ref 0.00–0.07)
Basophils Absolute: 0 10*3/uL (ref 0.0–0.1)
Basophils Relative: 1 %
Eosinophils Absolute: 0.1 10*3/uL (ref 0.0–0.5)
Eosinophils Relative: 1 %
HCT: 30.2 % — ABNORMAL LOW (ref 36.0–46.0)
Hemoglobin: 9.7 g/dL — ABNORMAL LOW (ref 12.0–15.0)
Immature Granulocytes: 2 %
Lymphocytes Relative: 5 %
Lymphs Abs: 0.4 10*3/uL — ABNORMAL LOW (ref 0.7–4.0)
MCH: 28.9 pg (ref 26.0–34.0)
MCHC: 32.1 g/dL (ref 30.0–36.0)
MCV: 89.9 fL (ref 80.0–100.0)
Monocytes Absolute: 0.7 10*3/uL (ref 0.1–1.0)
Monocytes Relative: 8 %
Neutro Abs: 7.3 10*3/uL (ref 1.7–7.7)
Neutrophils Relative %: 83 %
Platelet Count: 193 10*3/uL (ref 150–400)
RBC: 3.36 MIL/uL — ABNORMAL LOW (ref 3.87–5.11)
RDW: 14.2 % (ref 11.5–15.5)
WBC Count: 8.7 10*3/uL (ref 4.0–10.5)
nRBC: 0 % (ref 0.0–0.2)

## 2023-07-21 LAB — IRON AND IRON BINDING CAPACITY (CC-WL,HP ONLY)
Iron: 29 ug/dL (ref 28–170)
Saturation Ratios: 9 % — ABNORMAL LOW (ref 10.4–31.8)
TIBC: 330 ug/dL (ref 250–450)
UIBC: 301 ug/dL (ref 148–442)

## 2023-07-21 LAB — RETICULOCYTES
Immature Retic Fract: 17.1 % — ABNORMAL HIGH (ref 2.3–15.9)
RBC.: 3.35 MIL/uL — ABNORMAL LOW (ref 3.87–5.11)
Retic Count, Absolute: 51.6 10*3/uL (ref 19.0–186.0)
Retic Ct Pct: 1.5 % (ref 0.4–3.1)

## 2023-07-21 LAB — FERRITIN: Ferritin: 71 ng/mL (ref 11–307)

## 2023-07-21 MED ORDER — HEPARIN SOD (PORK) LOCK FLUSH 100 UNIT/ML IV SOLN
500.0000 [IU] | Freq: Once | INTRAVENOUS | Status: AC | PRN
  Administered 2023-07-21: 500 [IU]

## 2023-07-21 MED ORDER — SODIUM CHLORIDE 0.9 % IV SOLN: Freq: Once | INTRAVENOUS | Status: AC

## 2023-07-21 MED ORDER — PEMBROLIZUMAB CHEMO INJECTION 100 MG/4ML
200.0000 mg | Freq: Once | INTRAVENOUS | Status: AC
  Administered 2023-07-21: 200 mg via INTRAVENOUS
  Filled 2023-07-21: qty 8

## 2023-07-21 MED ORDER — SODIUM CHLORIDE 0.9% FLUSH
10.0000 mL | INTRAVENOUS | Status: DC | PRN
  Administered 2023-07-21: 10 mL

## 2023-07-21 NOTE — Patient Instructions (Signed)
[facility] - A DEPT OF [facility]  Discharge Instructions: Thank you for choosing [facility] to provide your oncology and hematology care.   If you have a lab appointment with the Cancer Center, please go directly to the Cancer Center and check in at the registration area.  Wear comfortable clothing and clothing appropriate for easy access to any Portacath or PICC line.   We strive to give you quality time with your provider. You may need to reschedule your appointment if you arrive late (15 or more minutes).  Arriving late affects you and other patients whose appointments are after yours.  Also, if you miss three or more appointments without notifying the office, you may be dismissed from the clinic at the provider's discretion.      For prescription refill requests, have your pharmacy contact our office and allow 72 hours for refills to be completed.    Today you received the following chemotherapy and/or immunotherapy agents Keytruda       To help prevent nausea and vomiting after your treatment, we encourage you to take your nausea medication as directed.  BELOW ARE SYMPTOMS THAT SHOULD BE REPORTED IMMEDIATELY: *FEVER GREATER THAN 100.4 F (38 C) OR HIGHER *CHILLS OR SWEATING *NAUSEA AND VOMITING THAT IS NOT CONTROLLED WITH YOUR NAUSEA MEDICATION *UNUSUAL SHORTNESS OF BREATH *UNUSUAL BRUISING OR BLEEDING *URINARY PROBLEMS (pain or burning when urinating, or frequent urination) *BOWEL PROBLEMS (unusual diarrhea, constipation, pain near the anus) TENDERNESS IN MOUTH AND THROAT WITH OR WITHOUT PRESENCE OF ULCERS (sore throat, sores in mouth, or a toothache) UNUSUAL RASH, SWELLING OR PAIN  UNUSUAL VAGINAL DISCHARGE OR ITCHING   Items with * indicate a potential emergency and should be followed up as soon as possible or go to the Emergency Department if any problems should occur.  Please show the CHEMOTHERAPY ALERT CARD or IMMUNOTHERAPY  ALERT CARD at check-in to the Emergency Department and triage nurse. Should you have questions after your visit or need to cancel or reschedule your appointment, please contact [facility] - A DEPT OF [facility]  [phone] and follow the prompts.  Office hours are 8:00 a.m. to 4:30 p.m. Monday - Friday. Please note that voicemails left after 4:00 p.m. may not be returned until the following business day.  We are closed weekends and major holidays. You have access to a nurse at all times for urgent questions. Please call the main number to the clinic [phone] and follow the prompts.  For any non-urgent questions, you may also contact your provider using MyChart. We now offer e-Visits for anyone 7 and older to request care online for non-urgent symptoms. For details visit [email or web address].   Also download the MyChart app! Go to the app store, search "MyChart", open the app, select [place], and log in with your MyChart username and password.

## 2023-07-21 NOTE — Progress Notes (Signed)
Hematology and Oncology Follow Up Visit  [name] [id number] February 02, 1961 63 y.o. 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 She had some GI upset over the weekend. She is feeling better today and wishes to have treatment. She is doing well with the pembrolizumab.    We continue to follow her Inhibin B.  Thankfully, the last level was down to 159.3  She has had no cough or shortness of breath.  She is on Coumadin because of her cardiac valve.  This is managed by her cardiologist. INR has been well controlled. Her last INR was 3.8 on 06/13/2023. She has lab repeat tomorrow.  She has had no change in bowel or bladder habits.  There has been no bleeding to her knowledge: denies epistaxis, gingivitis, hemoptysis, hematemesis, hematuria, melena, excessive bruising, blood donation.   Her appetite has been good.  She has had no nausea or vomiting. Wt Readings from Last 3 Encounters:  07/21/23 165 lb (74.8 kg)  06/20/23 168 lb (76.2 kg)  05/30/23 166 lb (75.3 kg)   Overall, I would say that her performance status is probably ECOG 1.    Medications:  Allergies as of 07/21/2023       Reactions   Augmentin [amoxicillin-pot Clavulanate] Other (See Comments)   Muscle Aches   Prochlorperazine Edisylate Other (See Comments)   Nervous/ flutter/ shakes   Adhesive [tape] Hives   Redness/hives from adhesive tape( tolerates latex gloves); tolerates paper tape        Medication List        Accurate as of July 21, 2023 11:24 AM. If you have any questions, ask your nurse or doctor.          albuterol 108 (90 Base) MCG/ACT  inhaler Commonly known as: VENTOLIN HFA TAKE 2 PUFFS BY MOUTH EVERY 6 HOURS AS NEEDED FOR WHEEZE OR SHORTNESS OF BREATH   amoxicillin 500 MG tablet Commonly known as: AMOXIL Take 2,000 mg by mouth See admin instructions. Take 2,000 mg one hour prior to dental visits.   aspirin EC 81 MG tablet Take 81 mg by mouth daily.   b complex vitamins capsule Take 1 capsule by mouth 2 (two) times daily.   carvedilol 3.125 MG tablet Commonly known as: COREG TAKE ONE TABLET BY MOUTH TWICE DAILY WITH A MEAL   cholecalciferol 25 MCG (1000 UNIT) tablet Commonly known as: VITAMIN D3 Take 4,000 Units by mouth daily.   furosemide 20 MG tablet Commonly known as: LASIX TAKE ONE TABLET BY MOUTH EVERY DAY   levothyroxine 100 MCG tablet Commonly known as: Synthroid Take 1 tablet (100 mcg total) by mouth daily before breakfast.   lidocaine-prilocaine cream Commonly known as: EMLA Apply 1 Application topically as needed. Apply to port per instructions   loperamide 2 MG tablet Commonly known as: IMODIUM A-D Take 4 mg by mouth 3 (three) times daily as needed for diarrhea or loose stools.   LORazepam 1 MG tablet Commonly known as: ATIVAN TAKE ONE TABLET BY MOUTH EVERY 8 HOURS AS NEEDED FOR ANXIETY   methocarbamol 500 MG tablet Commonly known as: ROBAXIN TAKE  ONE TABLET BY MOUTH EVERY 6 HOURS AS NEEDED FOR MUSCLE SPASMS   Potassium Chloride ER 20 MEQ Tbcr Take 1 tablet (20 mEq total) by mouth daily.   sodium chloride 0.9 % SOLN 50 mL with pembrolizumab 100 MG/4ML SOLN 200 mg Inject 200 mg into the vein every 21 ( twenty-one) days.   spironolactone 25 MG tablet Commonly known as: ALDACTONE Take 1 tablet (25 mg total) by mouth daily.   traMADol 50 MG tablet Commonly known as: ULTRAM TAKE ONE TABLET BY MOUTH EVERY 6 HOURS AS NEEDED   Vitamin D (Ergocalciferol) 1.25 MG (50000 UNIT) Caps capsule Commonly known as: DRISDOL TAKE ONE CAPSULE BY MOUTH every 7 DAYS   warfarin 7.5 MG  tablet Commonly known as: COUMADIN Take as directed by the anticoagulation clinic. If you are unsure how to take this medication, talk to your nurse or doctor. Original instructions: Take 1/2 tablet to 1 tablet by mouth daily as directed by Anticoagulation Clinic.   zolpidem 10 MG tablet Commonly known as: AMBIEN TAKE ONE TABLET BY MOUTH EVERY DAY        Allergies:  Allergies  Allergen Reactions   Augmentin [Amoxicillin-Pot Clavulanate] Other (See Comments)    Muscle Aches   Prochlorperazine Edisylate Other (See Comments)    Nervous/ flutter/ shakes   Adhesive [Tape] Hives    Redness/hives from adhesive tape( tolerates latex gloves); tolerates paper tape    Past Medical History, Surgical history, Social history, and Family History were reviewed and updated.  Review of Systems:  Review of Systems  Constitutional: Negative.   HENT: Negative.    Eyes: Negative.   Respiratory: Negative.    Cardiovascular: Negative.   Gastrointestinal: Negative.   Genitourinary: Negative.   Musculoskeletal: Negative.   Skin: Negative.   Neurological: Negative.   Endo/Heme/Allergies: Negative.   Psychiatric/Behavioral: Negative.        Exam:  height is 5\' 6"  (1.676 m) and weight is 165 lb (74.8 kg). Her oral temperature is 98.4 F (36.9 C). Her blood pressure is 111/52 (abnormal) and her pulse is 62. Her respiration is 18 and oxygen saturation is 98%.   Wt Readings from Last 3 Encounters:  07/21/23 165 lb (74.8 kg)  06/20/23 168 lb (76.2 kg)  05/30/23 166 lb (75.3 kg)    Physical Exam Vitals reviewed.  HENT:     Head: Normocephalic and atraumatic.  Eyes:     Pupils: Pupils are equal, round, and reactive to light.  Cardiovascular:     Rate and Rhythm: Normal rate and regular rhythm.     Heart sounds: Normal heart sounds.  Pulmonary:     Effort: Pulmonary effort is normal.     Breath sounds: Normal breath sounds.  Abdominal:     General: Bowel sounds are normal.      Palpations: Abdomen is soft.  Musculoskeletal:        General: No tenderness or deformity. Normal range of motion.     Cervical back: Normal range of motion.  Lymphadenopathy:     Cervical: No cervical adenopathy.  Skin:    General: Skin is warm and dry.     Findings: No erythema or rash.  Neurological:     Mental Status: She is alert and oriented to person, place, and time.  Psychiatric:        Behavior: Behavior normal.        Thought Content: Thought content normal.        Judgment: Judgment normal.    Lab  Results  Component Value Date   WBC 8.7 07/21/2023   HGB 9.7 (L) 07/21/2023   HCT 30.2 (L) 07/21/2023   MCV 89.9 07/21/2023   PLT 193 07/21/2023   Lab Results  Component Value Date   FERRITIN 27 05/30/2023   IRON 56 05/30/2023   TIBC 384 05/30/2023   UIBC 328 05/30/2023   IRONPCTSAT 15 05/30/2023   Lab Results  Component Value Date   RETICCTPCT 1.5 07/21/2023   RBC 3.36 (L) 07/21/2023   RBC 3.35 (L) 07/21/2023   No results found for: "KPAFRELGTCHN", "LAMBDASER", "KAPLAMBRATIO" No results found for: "IGGSERUM", "IGA", "IGMSERUM" No results found for: "TOTALPROTELP", "ALBUMINELP", "A1GS", "A2GS", "BETS", "BETA2SER", "GAMS", "MSPIKE", "SPEI"   Chemistry      Component Value Date/Time   NA 139 06/20/2023 1015   NA 139 08/14/2020 1428   NA 140 03/15/2016 0953   K 3.4 (L) 06/20/2023 1015   K 3.9 03/15/2016 0953   CL 103 06/20/2023 1015   CL 102 12/13/2015 0803   CO2 28 06/20/2023 1015   CO2 19 (L) 03/15/2016 0953   BUN 10 06/20/2023 1015   BUN 16 08/14/2020 1428   BUN 15.9 03/15/2016 0953   CREATININE 1.07 (H) 06/20/2023 1015   CREATININE 1.4 (H) 03/15/2016 0953      Component Value Date/Time   CALCIUM 8.4 (L) 06/20/2023 1015   CALCIUM 9.3 03/15/2016 0953   ALKPHOS 42 06/20/2023 1015   ALKPHOS 55 03/15/2016 0953   AST 12 (L) 06/20/2023 1015   AST 17 03/15/2016 0953   ALT 7 06/20/2023 1015   ALT 23 03/15/2016 0953   BILITOT 0.3 06/20/2023 1015    BILITOT 0.38 03/15/2016 0953     Encounter Diagnoses  Name Primary?   Granulosa cell tumor of ovary, unspecified laterality Yes   Anemia associated with chemotherapy    Iron deficiency anemia due to chronic blood loss    Port-A-Cath in place     Impression and Plan: Ms. Shepardson is a very pleasant 63 yo caucasian female with recurrent granulosa cell tumor.  We now have her on immunotherapy.  We are trying to avoid systemic chemotherapy if possible.  Inhibin B has been stable which is good. Only concern is her Hgb which has slowly trended downward. Thought to be secondary to her immunotherapy.   We will go ahead with her on the 13th treatment today.   If needed, Dr. Myna Hidalgo has suggested FGFR2 inhibitor - erdatifinib.  Treatment today (pending that CMP is within parameters)  RTC 3 weeks Ennever only, port labs (CBC w/, CMP, Inhibin B, retic, iron, ferritin), Treatment [address] [place], [place] 1/27/202511:24 AM

## 2023-07-21 NOTE — Patient Instructions (Signed)

## 2023-07-22 ENCOUNTER — Ambulatory Visit: Payer: 59 | Attending: Cardiology | Admitting: *Deleted

## 2023-07-22 ENCOUNTER — Encounter: Payer: Self-pay | Admitting: *Deleted

## 2023-07-22 DIAGNOSIS — Z952 Presence of prosthetic heart valve: Secondary | ICD-10-CM

## 2023-07-22 DIAGNOSIS — I480 Paroxysmal atrial fibrillation: Secondary | ICD-10-CM

## 2023-07-22 DIAGNOSIS — Z953 Presence of xenogenic heart valve: Secondary | ICD-10-CM

## 2023-07-22 LAB — POCT INR: INR: 4.2 — AB (ref 2.0–3.0)

## 2023-07-22 NOTE — Patient Instructions (Signed)
Description   NO PRINTOUT. Do not take any warfarin today then START taking warfarin 1/2 tablet daily except for 1 tablet on Tuesdays, Thursdays, and Saturdays.  Recheck INR in 3 weeks.  Coumadin Clinic [phone]

## 2023-07-23 ENCOUNTER — Encounter: Payer: Self-pay | Admitting: *Deleted

## 2023-07-23 LAB — INHIBIN B: Inhibin B: 71.3 pg/mL — ABNORMAL HIGH (ref 0.0–16.9)

## 2023-07-25 ENCOUNTER — Other Ambulatory Visit: Payer: Self-pay | Admitting: Hematology & Oncology

## 2023-07-25 DIAGNOSIS — D3910 Neoplasm of uncertain behavior of unspecified ovary: Secondary | ICD-10-CM

## 2023-07-30 ENCOUNTER — Inpatient Hospital Stay: Payer: 59 | Attending: Hematology & Oncology

## 2023-07-30 DIAGNOSIS — Z952 Presence of prosthetic heart valve: Secondary | ICD-10-CM | POA: Insufficient documentation

## 2023-07-30 DIAGNOSIS — D508 Other iron deficiency anemias: Secondary | ICD-10-CM | POA: Insufficient documentation

## 2023-07-30 DIAGNOSIS — C786 Secondary malignant neoplasm of retroperitoneum and peritoneum: Secondary | ICD-10-CM | POA: Insufficient documentation

## 2023-07-30 DIAGNOSIS — Z7901 Long term (current) use of anticoagulants: Secondary | ICD-10-CM | POA: Insufficient documentation

## 2023-07-30 DIAGNOSIS — D3910 Neoplasm of uncertain behavior of unspecified ovary: Secondary | ICD-10-CM

## 2023-07-30 DIAGNOSIS — K909 Intestinal malabsorption, unspecified: Secondary | ICD-10-CM | POA: Insufficient documentation

## 2023-07-30 DIAGNOSIS — C569 Malignant neoplasm of unspecified ovary: Secondary | ICD-10-CM | POA: Insufficient documentation

## 2023-08-01 ENCOUNTER — Other Ambulatory Visit: Payer: Self-pay | Admitting: Hematology & Oncology

## 2023-08-01 DIAGNOSIS — D3910 Neoplasm of uncertain behavior of unspecified ovary: Secondary | ICD-10-CM

## 2023-08-06 ENCOUNTER — Inpatient Hospital Stay: Payer: 59

## 2023-08-07 ENCOUNTER — Other Ambulatory Visit: Payer: Self-pay | Admitting: Hematology & Oncology

## 2023-08-07 DIAGNOSIS — F419 Anxiety disorder, unspecified: Secondary | ICD-10-CM

## 2023-08-11 ENCOUNTER — Other Ambulatory Visit: Payer: Self-pay

## 2023-08-11 ENCOUNTER — Other Ambulatory Visit: Payer: 59

## 2023-08-11 ENCOUNTER — Inpatient Hospital Stay: Payer: 59

## 2023-08-11 ENCOUNTER — Ambulatory Visit: Payer: 59

## 2023-08-11 ENCOUNTER — Ambulatory Visit: Payer: 59 | Admitting: Hematology & Oncology

## 2023-08-11 DIAGNOSIS — D3910 Neoplasm of uncertain behavior of unspecified ovary: Secondary | ICD-10-CM

## 2023-08-12 ENCOUNTER — Inpatient Hospital Stay: Payer: 59

## 2023-08-12 ENCOUNTER — Encounter: Payer: Self-pay | Admitting: Hematology & Oncology

## 2023-08-12 ENCOUNTER — Other Ambulatory Visit: Payer: Self-pay

## 2023-08-12 ENCOUNTER — Inpatient Hospital Stay (HOSPITAL_BASED_OUTPATIENT_CLINIC_OR_DEPARTMENT_OTHER): Payer: 59 | Admitting: Hematology & Oncology

## 2023-08-12 VITALS — BP 102/71 | HR 71

## 2023-08-12 VITALS — BP 93/59 | HR 83 | Temp 97.7°F | Resp 18 | Ht 66.0 in | Wt 158.0 lb

## 2023-08-12 DIAGNOSIS — Z7901 Long term (current) use of anticoagulants: Secondary | ICD-10-CM | POA: Diagnosis not present

## 2023-08-12 DIAGNOSIS — C786 Secondary malignant neoplasm of retroperitoneum and peritoneum: Secondary | ICD-10-CM | POA: Diagnosis not present

## 2023-08-12 DIAGNOSIS — F419 Anxiety disorder, unspecified: Secondary | ICD-10-CM

## 2023-08-12 DIAGNOSIS — Z952 Presence of prosthetic heart valve: Secondary | ICD-10-CM | POA: Diagnosis not present

## 2023-08-12 DIAGNOSIS — D508 Other iron deficiency anemias: Secondary | ICD-10-CM | POA: Diagnosis present

## 2023-08-12 DIAGNOSIS — C569 Malignant neoplasm of unspecified ovary: Secondary | ICD-10-CM | POA: Diagnosis not present

## 2023-08-12 DIAGNOSIS — D3910 Neoplasm of uncertain behavior of unspecified ovary: Secondary | ICD-10-CM

## 2023-08-12 DIAGNOSIS — K909 Intestinal malabsorption, unspecified: Secondary | ICD-10-CM | POA: Diagnosis not present

## 2023-08-12 DIAGNOSIS — D6481 Anemia due to antineoplastic chemotherapy: Secondary | ICD-10-CM

## 2023-08-12 LAB — CBC WITH DIFFERENTIAL (CANCER CENTER ONLY)
Abs Immature Granulocytes: 0.13 10*3/uL — ABNORMAL HIGH (ref 0.00–0.07)
Basophils Absolute: 0 10*3/uL (ref 0.0–0.1)
Basophils Relative: 0 %
Eosinophils Absolute: 0.1 10*3/uL (ref 0.0–0.5)
Eosinophils Relative: 1 %
HCT: 35.3 % — ABNORMAL LOW (ref 36.0–46.0)
Hemoglobin: 11.6 g/dL — ABNORMAL LOW (ref 12.0–15.0)
Immature Granulocytes: 1 %
Lymphocytes Relative: 7 %
Lymphs Abs: 0.7 10*3/uL (ref 0.7–4.0)
MCH: 28.6 pg (ref 26.0–34.0)
MCHC: 32.9 g/dL (ref 30.0–36.0)
MCV: 86.9 fL (ref 80.0–100.0)
Monocytes Absolute: 0.7 10*3/uL (ref 0.1–1.0)
Monocytes Relative: 8 %
Neutro Abs: 7.7 10*3/uL (ref 1.7–7.7)
Neutrophils Relative %: 83 %
Platelet Count: 250 10*3/uL (ref 150–400)
RBC: 4.06 MIL/uL (ref 3.87–5.11)
RDW: 14.2 % (ref 11.5–15.5)
WBC Count: 9.4 10*3/uL (ref 4.0–10.5)
nRBC: 0 % (ref 0.0–0.2)

## 2023-08-12 LAB — RETICULOCYTES
Immature Retic Fract: 13 % (ref 2.3–15.9)
RBC.: 4.03 MIL/uL (ref 3.87–5.11)
Retic Count, Absolute: 51.6 10*3/uL (ref 19.0–186.0)
Retic Ct Pct: 1.3 % (ref 0.4–3.1)

## 2023-08-12 LAB — IRON AND IRON BINDING CAPACITY (CC-WL,HP ONLY)
Iron: 59 ug/dL (ref 28–170)
Saturation Ratios: 14 % (ref 10.4–31.8)
TIBC: 413 ug/dL (ref 250–450)
UIBC: 354 ug/dL (ref 148–442)

## 2023-08-12 LAB — CMP (CANCER CENTER ONLY)
ALT: 11 U/L (ref 0–44)
AST: 13 U/L — ABNORMAL LOW (ref 15–41)
Albumin: 3.9 g/dL (ref 3.5–5.0)
Alkaline Phosphatase: 50 U/L (ref 38–126)
Anion gap: 9 (ref 5–15)
BUN: 16 mg/dL (ref 8–23)
CO2: 27 mmol/L (ref 22–32)
Calcium: 9.3 mg/dL (ref 8.9–10.3)
Chloride: 100 mmol/L (ref 98–111)
Creatinine: 1.11 mg/dL — ABNORMAL HIGH (ref 0.44–1.00)
GFR, Estimated: 56 mL/min — ABNORMAL LOW (ref >60)
Glucose, Bld: 88 mg/dL (ref 70–99)
Potassium: 3.5 mmol/L (ref 3.5–5.1)
Sodium: 136 mmol/L (ref 135–145)
Total Bilirubin: 0.4 mg/dL (ref 0.0–1.2)
Total Protein: 6.3 g/dL — ABNORMAL LOW (ref 6.5–8.1)

## 2023-08-12 LAB — FERRITIN: Ferritin: 14 ng/mL (ref 11–307)

## 2023-08-12 MED ORDER — SODIUM CHLORIDE 0.9 % IV SOLN
125.0000 mg | Freq: Once | INTRAVENOUS | Status: AC
  Administered 2023-08-12: 125 mg via INTRAVENOUS
  Filled 2023-08-12: qty 10

## 2023-08-12 MED ORDER — HEPARIN SOD (PORK) LOCK FLUSH 100 UNIT/ML IV SOLN
500.0000 [IU] | Freq: Once | INTRAVENOUS | Status: AC
Start: 2023-08-12 — End: 2023-08-12
  Administered 2023-08-12: 500 [IU] via INTRAVENOUS

## 2023-08-12 MED ORDER — SODIUM CHLORIDE 0.9% FLUSH
10.0000 mL | INTRAVENOUS | Status: DC | PRN
  Administered 2023-08-12: 10 mL via INTRAVENOUS

## 2023-08-12 MED ORDER — SODIUM CHLORIDE 0.9 % IV SOLN: INTRAVENOUS | Status: DC

## 2023-08-12 NOTE — Patient Instructions (Addendum)
Sodium Ferric Gluconate Complex Injection What is this medication? SODIUM FERRIC GLUCONATE COMPLEX (SOE dee um FER ik GLOO koe nate KOM pleks) treats low levels of iron (iron deficiency anemia) in people with kidney disease. Iron is a mineral that plays an important role in making red blood cells, which carry oxygen from your lungs to the rest of your body. This medicine may be used for other purposes; ask your health care provider or pharmacist if you have questions. COMMON BRAND NAME(S): Ferrlecit, Nulecit What should I tell my care team before I take this medication? They need to know if you have any of the following conditions: Anemia that is not from iron deficiency High levels of iron in the blood An unusual or allergic reaction to iron, other medications, foods, dyes, or preservatives Pregnant or are trying to become pregnant Breast-feeding How should I use this medication? This medication is injected into a vein. It is given by your care team in a hospital or clinic setting. Talk to your care team about the use of this medication in children. While it may be prescribed for children as young as 6 years for selected conditions, precautions do apply. Overdosage: If you think you have taken too much of this medicine contact a poison control center or emergency room at once. NOTE: This medicine is only for you. Do not share this medicine with others. What if I miss a dose? It is important not to miss your dose. Call your care team if you are unable to keep an appointment. What may interact with this medication? Do not take this medication with any of the following: Deferasirox Deferoxamine Dimercaprol This medication may also interact with the following: Other iron products This list may not describe all possible interactions. Give your health care provider a list of all the medicines, herbs, non-prescription drugs, or dietary supplements you use. Also tell them if you smoke, drink  alcohol, or use illegal drugs. Some items may interact with your medicine. What should I watch for while using this medication? Your condition will be monitored carefully while you are receiving this medication. Visit your care team for regular checks on your progress. You may need blood work while you are taking this medication. What side effects may I notice from receiving this medication? Side effects that you should report to your care team as soon as possible: Allergic reactions--skin rash, itching, hives, swelling of the face, lips, tongue, or throat Low blood pressure--dizziness, feeling faint or lightheaded, blurry vision Shortness of breath Side effects that usually do not require medical attention (report to your care team if they continue or are bothersome): Flushing Headache Joint pain Muscle pain Nausea Pain, redness, or irritation at injection site This list may not describe all possible side effects. Call your doctor for medical advice about side effects. You may report side effects to FDA at 1-800-FDA-1088. Where should I keep my medication? This medication is given in a hospital or clinic and will not be stored at home. NOTE: This sheet is a summary. It may not cover all possible information. If you have questions about this medicine, talk to your doctor, pharmacist, or health care provider.  2024 Elsevier/Gold Standard (2020-11-03 00:00:00)[facility] - A DEPT OF [facility]  Discharge Instructions: Thank you for choosing [facility] to provide your oncology and hematology care.   If you have a lab appointment with the Cancer Center, please go directly to the Cancer Center  and check in at the registration area.  Wear comfortable clothing and clothing appropriate for easy access to any Portacath or PICC line.   We strive to give you quality time with your provider. You may need to reschedule your appointment if you arrive  late (15 or more minutes).  Arriving late affects you and other patients whose appointments are after yours.  Also, if you miss three or more appointments without notifying the office, you may be dismissed from the clinic at the provider's discretion.      For prescription refill requests, have your pharmacy contact our office and allow 72 hours for refills to be completed.    Today you received the following chemotherapy and/or immunotherapy agents Keytruda      To help prevent nausea and vomiting after your treatment, we encourage you to take your nausea medication as directed.  BELOW ARE SYMPTOMS THAT SHOULD BE REPORTED IMMEDIATELY: *FEVER GREATER THAN 100.4 F (38 C) OR HIGHER *CHILLS OR SWEATING *NAUSEA AND VOMITING THAT IS NOT CONTROLLED WITH YOUR NAUSEA MEDICATION *UNUSUAL SHORTNESS OF BREATH *UNUSUAL BRUISING OR BLEEDING *URINARY PROBLEMS (pain or burning when urinating, or frequent urination) *BOWEL PROBLEMS (unusual diarrhea, constipation, pain near the anus) TENDERNESS IN MOUTH AND THROAT WITH OR WITHOUT PRESENCE OF ULCERS (sore throat, sores in mouth, or a toothache) UNUSUAL RASH, SWELLING OR PAIN  UNUSUAL VAGINAL DISCHARGE OR ITCHING   Items with * indicate a potential emergency and should be followed up as soon as possible or go to the Emergency Department if any problems should occur.  Please show the CHEMOTHERAPY ALERT CARD or IMMUNOTHERAPY ALERT CARD at check-in to the Emergency Department and triage nurse. Should you have questions after your visit or need to cancel or reschedule your appointment, please contact [facility] - A DEPT OF Eligh[facility] Brideg[facility]  [phone] and follow the prompts.  Office hours are 8:00 a.m. to 4:30 p.m. Monday - Friday. Please note that voicemails left after 4:00 p.m. may not be returned until the following business day.  We are closed weekends and major holidays. You have access to a nurse at all times for urgent  questions. Please call the main number to the clinic [phone] and follow the prompts.  For any non-urgent questions, you may also contact your provider using MyChart. We now offer e-Visits for anyone 76 and older to request care online for non-urgent symptoms. For details visit [email or web address].   Also download the MyChart app! Go to the app store, search "MyChart", open the app, select [place], and log in with your MyChart username and password.

## 2023-08-12 NOTE — Addendum Note (Signed)
Addended by: Geralyn Flash D on: 08/12/2023 12:07 PM   Modules accepted: Orders

## 2023-08-12 NOTE — Progress Notes (Signed)
 Remote ICD transmission.

## 2023-08-12 NOTE — Progress Notes (Signed)
No Keytruda today per MD.  Ferrlecit #1/3 received today.  Anola Gurney [place], [place], BCPS, BCOP 08/12/2023 2:20 PM

## 2023-08-12 NOTE — Progress Notes (Unsigned)
Hematology and Oncology Follow Up Visit  [name] [id number] 01/19/1961 63 y.o. 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 is here today for follow-up.  She will does not feel all that well.  She has no energy.  She just feels tired.  She has been mostly worried because she is not doing well.  It is hard to figure out why she is having problems.  Typically, immunotherapy does not cause a lot of fatigue.  I know that she has had iron deficiency issues.  She has been getting IV iron.  Her last Inhibin B was actually pretty good at 71.3.  She has not had a CT scan for quite a while.  I think it may be a good idea to get 1 to see how things look.  I really do not see a problem with holding off on treatment for her for right now.  This is all about quality of life for her.  I want to make sure that she is able to enjoy her quality of life and be able to enjoy her grandbabies.  Her appetite is marginal.  She has had some nausea but no vomiting.  There is been no bleeding.  Overall, I would say that her performance status is probably ECOG 1.    Wt Readings from Last 3 Encounters:  08/12/23 158 lb (71.7 kg)  07/21/23 165 lb (74.8 kg)  06/20/23 168 lb (76.2 kg)     Medications:  Allergies as of 08/12/2023       Reactions   Augmentin [amoxicillin-pot Clavulanate] Other (See Comments)   Muscle Aches   Prochlorperazine Edisylate Other (See Comments)   Nervous/ flutter/ shakes   Adhesive [tape] Hives   Redness/hives from adhesive tape( tolerates latex gloves); tolerates paper tape        Medication List        Accurate as of August 12, 2023  1:10  PM. If you have any questions, ask your nurse or doctor.          albuterol 108 (90 Base) MCG/ACT inhaler Commonly known as: VENTOLIN HFA TAKE 2 PUFFS BY MOUTH EVERY 6 HOURS AS NEEDED FOR WHEEZE OR SHORTNESS OF BREATH   amoxicillin 500 MG tablet Commonly known as: AMOXIL Take 2,000 mg by mouth See admin instructions. Take 2,000 mg one hour prior to dental visits.   aspirin EC 81 MG tablet Take 81 mg by mouth daily.   azithromycin 500 MG tablet Commonly known as: ZITHROMAX Take 500 mg by mouth daily.   b complex vitamins capsule Take 1 capsule by mouth 2 (two) times daily.   carvedilol 3.125 MG tablet Commonly known as: COREG TAKE ONE TABLET BY MOUTH TWICE DAILY WITH A MEAL   cholecalciferol 25 MCG (1000 UNIT) tablet Commonly known as: VITAMIN D3 Take 4,000 Units by mouth daily.   furosemide 20 MG tablet Commonly known as: LASIX TAKE ONE TABLET BY MOUTH EVERY DAY   levothyroxine 100 MCG tablet Commonly known as: Synthroid Take 1 tablet (100 mcg total) by mouth daily before breakfast.   lidocaine-prilocaine cream Commonly known as: EMLA Apply 1 Application topically  as needed. Apply to port per instructions   loperamide 2 MG tablet Commonly known as: IMODIUM A-D Take 4 mg by mouth 3 (three) times daily as needed for diarrhea or loose stools.   LORazepam 1 MG tablet Commonly known as: ATIVAN TAKE ONE TABLET BY MOUTH EVERY 8 HOURS AS NEEDED FOR ANXIETY   methocarbamol 500 MG tablet Commonly known as: ROBAXIN TAKE ONE TABLET BY MOUTH EVERY 6 HOURS AS NEEDED FOR MUSCLE SPASMS   Potassium Chloride ER 20 MEQ Tbcr Take 1 tablet (20 mEq total) by mouth daily.   potassium chloride SA 20 MEQ tablet Commonly known as: KLOR-CON M Take 20 mEq by mouth daily.   sodium chloride 0.9 % SOLN 50 mL with pembrolizumab 100 MG/4ML SOLN 200 mg Inject 200 mg into the vein every 21 ( twenty-one) days.   spironolactone 25 MG tablet Commonly known as: ALDACTONE Take 1 tablet  (25 mg total) by mouth daily.   traMADol 50 MG tablet Commonly known as: ULTRAM TAKE ONE TABLET BY MOUTH EVERY 6 HOURS AS NEEDED   Vitamin D (Ergocalciferol) 1.25 MG (50000 UNIT) Caps capsule Commonly known as: DRISDOL TAKE ONE CAPSULE BY MOUTH every 7 DAYS   warfarin 7.5 MG tablet Commonly known as: COUMADIN Take as directed by the anticoagulation clinic. If you are unsure how to take this medication, talk to your nurse or doctor. Original instructions: Take 1/2 tablet to 1 tablet by mouth daily as directed by Anticoagulation Clinic.   zolpidem 10 MG tablet Commonly known as: AMBIEN TAKE ONE TABLET BY MOUTH EVERY DAY        Allergies:  Allergies  Allergen Reactions   Augmentin [Amoxicillin-Pot Clavulanate] Other (See Comments)    Muscle Aches   Prochlorperazine Edisylate Other (See Comments)    Nervous/ flutter/ shakes   Adhesive [Tape] Hives    Redness/hives from adhesive tape( tolerates latex gloves); tolerates paper tape    Past Medical History, Surgical history, Social history, and Family History were reviewed and updated.  Review of Systems:  Review of Systems  Constitutional:  Positive for malaise/fatigue.  HENT: Negative.    Eyes: Negative.   Respiratory: Negative.    Cardiovascular: Negative.   Gastrointestinal:  Positive for nausea.  Genitourinary: Negative.   Musculoskeletal:  Positive for myalgias.  Skin: Negative.   Neurological:  Positive for weakness.  Endo/Heme/Allergies: Negative.   Psychiatric/Behavioral:  The patient is nervous/anxious.       Exam:  height is 5\' 6"  (1.676 m) and weight is 158 lb (71.7 kg). Her oral temperature is 97.7 F (36.5 C). Her blood pressure is 93/59 (abnormal) and her pulse is 83. Her respiration is 18 and oxygen saturation is 99%.   Wt Readings from Last 3 Encounters:  08/12/23 158 lb (71.7 kg)  07/21/23 165 lb (74.8 kg)  06/20/23 168 lb (76.2 kg)    Physical Exam Vitals reviewed.  HENT:     Head:  Normocephalic and atraumatic.  Eyes:     Pupils: Pupils are equal, round, and reactive to light.  Cardiovascular:     Rate and Rhythm: Normal rate and regular rhythm.     Heart sounds: Normal heart sounds.  Pulmonary:     Effort: Pulmonary effort is normal.     Breath sounds: Normal breath sounds.  Abdominal:     General: Bowel sounds are normal.     Palpations: Abdomen is soft.  Musculoskeletal:        General: No tenderness or deformity. Normal range of  motion.     Cervical back: Normal range of motion.  Lymphadenopathy:     Cervical: No cervical adenopathy.  Skin:    General: Skin is warm and dry.     Findings: No erythema or rash.  Neurological:     Mental Status: She is alert and oriented to person, place, and time.  Psychiatric:        Behavior: Behavior normal.        Thought Content: Thought content normal.        Judgment: Judgment normal.    Lab Results  Component Value Date   WBC 9.4 08/12/2023   HGB 11.6 (L) 08/12/2023   HCT 35.3 (L) 08/12/2023   MCV 86.9 08/12/2023   PLT 250 08/12/2023   Lab Results  Component Value Date   FERRITIN 71 07/21/2023   IRON 29 07/21/2023   TIBC 330 07/21/2023   UIBC 301 07/21/2023   IRONPCTSAT 9 (L) 07/21/2023   Lab Results  Component Value Date   RETICCTPCT 1.3 08/12/2023   RBC 4.06 08/12/2023   RBC 4.03 08/12/2023   No results found for: "KPAFRELGTCHN", "LAMBDASER", "KAPLAMBRATIO" No results found for: "IGGSERUM", "IGA", "IGMSERUM" No results found for: "TOTALPROTELP", "ALBUMINELP", "A1GS", "A2GS", "BETS", "BETA2SER", "GAMS", "MSPIKE", "SPEI"   Chemistry      Component Value Date/Time   NA 136 08/12/2023 1138   NA 139 08/14/2020 1428   NA 140 03/15/2016 0953   K 3.5 08/12/2023 1138   K 3.9 03/15/2016 0953   CL 100 08/12/2023 1138   CL 102 12/13/2015 0803   CO2 27 08/12/2023 1138   CO2 19 (L) 03/15/2016 0953   BUN 16 08/12/2023 1138   BUN 16 08/14/2020 1428   BUN 15.9 03/15/2016 0953   CREATININE 1.11 (H)  08/12/2023 1138   CREATININE 1.4 (H) 03/15/2016 0953      Component Value Date/Time   CALCIUM 9.3 08/12/2023 1138   CALCIUM 9.3 03/15/2016 0953   ALKPHOS 50 08/12/2023 1138   ALKPHOS 55 03/15/2016 0953   AST 13 (L) 08/12/2023 1138   AST 17 03/15/2016 0953   ALT 11 08/12/2023 1138   ALT 23 03/15/2016 0953   BILITOT 0.4 08/12/2023 1138   BILITOT 0.38 03/15/2016 0953      Impression and Plan: Ms. Westmoreland is a very pleasant 63 yo caucasian female with recurrent granulosa cell tumor.  We now have her on immunotherapy.  We are trying to avoid systemic chemotherapy if possible.  Again, we will have to hold off on treatment for right now.  I will have to get her set up with some scans.  I think this would be a good idea.  Maybe, the Inhibin B is not given as a good idea as to what is going on with her internally.  Again I do not think this is a cardiac issue.  Her heart sounded pretty good today.  Her labs all look all that bad.  Her iron saturation was 14%.  Again this should be improved with the IV iron that she is getting.  We will go ahead and plan for her CT scan to be done.  Will try to get this in a couple of weeks.  Maybe, if everything looks okay on the scan, we can hold off on treatment for right now and see if she does improve.    Josph Macho, MD 2/18/20251:10 PM

## 2023-08-13 ENCOUNTER — Encounter: Payer: Self-pay | Admitting: Hematology & Oncology

## 2023-08-13 ENCOUNTER — Other Ambulatory Visit: Payer: Self-pay

## 2023-08-14 ENCOUNTER — Ambulatory Visit: Payer: 59

## 2023-08-15 LAB — INHIBIN B: Inhibin B: 60.5 pg/mL — ABNORMAL HIGH (ref 0.0–16.9)

## 2023-08-19 ENCOUNTER — Telehealth: Payer: Self-pay | Admitting: *Deleted

## 2023-08-19 NOTE — Telephone Encounter (Signed)
 Mychart msg sent to pt.

## 2023-08-19 NOTE — Telephone Encounter (Signed)
-----   Message from Josph Macho sent at 08/19/2023  9:27 AM EST ----- Call - the Inhibin B level is lower at 60!!!!   [address]

## 2023-08-20 ENCOUNTER — Inpatient Hospital Stay: Payer: 59

## 2023-08-20 VITALS — BP 103/58 | HR 68 | Temp 98.8°F | Resp 17 | Wt 157.0 lb

## 2023-08-20 DIAGNOSIS — D6481 Anemia due to antineoplastic chemotherapy: Secondary | ICD-10-CM

## 2023-08-20 DIAGNOSIS — D508 Other iron deficiency anemias: Secondary | ICD-10-CM | POA: Diagnosis not present

## 2023-08-20 MED ORDER — SODIUM CHLORIDE 0.9 % IV SOLN: INTRAVENOUS | Status: DC

## 2023-08-20 MED ORDER — SODIUM CHLORIDE 0.9 % IV SOLN
125.0000 mg | Freq: Once | INTRAVENOUS | Status: AC
  Administered 2023-08-20: 125 mg via INTRAVENOUS
  Filled 2023-08-20: qty 10

## 2023-08-20 MED ORDER — SODIUM CHLORIDE 0.9% FLUSH
10.0000 mL | Freq: Once | INTRAVENOUS | Status: AC
  Administered 2023-08-20: 10 mL via INTRAVENOUS

## 2023-08-20 MED ORDER — HEPARIN SOD (PORK) LOCK FLUSH 100 UNIT/ML IV SOLN
500.0000 [IU] | Freq: Once | INTRAVENOUS | Status: AC
  Administered 2023-08-20: 500 [IU] via INTRAVENOUS

## 2023-08-20 NOTE — Patient Instructions (Signed)
Sodium Ferric Gluconate Complex Injection What is this medication? SODIUM FERRIC GLUCONATE COMPLEX (SOE dee um FER ik GLOO koe nate KOM pleks) treats low levels of iron (iron deficiency anemia) in people with kidney disease. Iron is a mineral that plays an important role in making red blood cells, which carry oxygen from your lungs to the rest of your body. This medicine may be used for other purposes; ask your health care provider or pharmacist if you have questions. COMMON BRAND NAME(S): Ferrlecit, Nulecit What should I tell my care team before I take this medication? They need to know if you have any of the following conditions: Anemia that is not from iron deficiency High levels of iron in the blood An unusual or allergic reaction to iron, other medications, foods, dyes, or preservatives Pregnant or are trying to become pregnant Breast-feeding How should I use this medication? This medication is injected into a vein. It is given by your care team in a hospital or clinic setting. Talk to your care team about the use of this medication in children. While it may be prescribed for children as young as 6 years for selected conditions, precautions do apply. Overdosage: If you think you have taken too much of this medicine contact a poison control center or emergency room at once. NOTE: This medicine is only for you. Do not share this medicine with others. What if I miss a dose? It is important not to miss your dose. Call your care team if you are unable to keep an appointment. What may interact with this medication? Do not take this medication with any of the following: Deferasirox Deferoxamine Dimercaprol This medication may also interact with the following: Other iron products This list may not describe all possible interactions. Give your health care provider a list of all the medicines, herbs, non-prescription drugs, or dietary supplements you use. Also tell them if you smoke, drink  alcohol, or use illegal drugs. Some items may interact with your medicine. What should I watch for while using this medication? Your condition will be monitored carefully while you are receiving this medication. Visit your care team for regular checks on your progress. You may need blood work while you are taking this medication. What side effects may I notice from receiving this medication? Side effects that you should report to your care team as soon as possible: Allergic reactions--skin rash, itching, hives, swelling of the face, lips, tongue, or throat Low blood pressure--dizziness, feeling faint or lightheaded, blurry vision Shortness of breath Side effects that usually do not require medical attention (report to your care team if they continue or are bothersome): Flushing Headache Joint pain Muscle pain Nausea Pain, redness, or irritation at injection site This list may not describe all possible side effects. Call your doctor for medical advice about side effects. You may report side effects to FDA at 1-800-FDA-1088. Where should I keep my medication? This medication is given in a hospital or clinic and will not be stored at home. NOTE: This sheet is a summary. It may not cover all possible information. If you have questions about this medicine, talk to your doctor, pharmacist, or health care provider.  2024 Elsevier/Gold Standard (2020-11-03 00:00:00)

## 2023-08-22 ENCOUNTER — Telehealth: Payer: Self-pay

## 2023-08-22 ENCOUNTER — Ambulatory Visit: Payer: 59

## 2023-08-22 NOTE — Telephone Encounter (Signed)
 Lpmtcb to reschedule INR appt

## 2023-08-26 ENCOUNTER — Other Ambulatory Visit: Payer: Self-pay | Admitting: Hematology & Oncology

## 2023-08-26 DIAGNOSIS — D3910 Neoplasm of uncertain behavior of unspecified ovary: Secondary | ICD-10-CM

## 2023-08-27 ENCOUNTER — Ambulatory Visit (HOSPITAL_BASED_OUTPATIENT_CLINIC_OR_DEPARTMENT_OTHER)
Admission: RE | Admit: 2023-08-27 | Discharge: 2023-08-27 | Disposition: A | Discharge status: Home / Self Care | Payer: 59 | Source: Ambulatory Visit | Attending: Hematology & Oncology | Admitting: Hematology & Oncology

## 2023-08-27 ENCOUNTER — Inpatient Hospital Stay: Payer: 59 | Attending: Hematology & Oncology

## 2023-08-27 ENCOUNTER — Encounter (HOSPITAL_BASED_OUTPATIENT_CLINIC_OR_DEPARTMENT_OTHER): Payer: Self-pay

## 2023-08-27 ENCOUNTER — Inpatient Hospital Stay: Payer: 59

## 2023-08-27 VITALS — BP 103/65 | HR 86 | Temp 97.7°F | Resp 17

## 2023-08-27 DIAGNOSIS — C786 Secondary malignant neoplasm of retroperitoneum and peritoneum: Secondary | ICD-10-CM | POA: Insufficient documentation

## 2023-08-27 DIAGNOSIS — D508 Other iron deficiency anemias: Secondary | ICD-10-CM | POA: Diagnosis present

## 2023-08-27 DIAGNOSIS — K909 Intestinal malabsorption, unspecified: Secondary | ICD-10-CM | POA: Diagnosis not present

## 2023-08-27 DIAGNOSIS — D3910 Neoplasm of uncertain behavior of unspecified ovary: Secondary | ICD-10-CM | POA: Diagnosis present

## 2023-08-27 DIAGNOSIS — Z7901 Long term (current) use of anticoagulants: Secondary | ICD-10-CM | POA: Insufficient documentation

## 2023-08-27 DIAGNOSIS — C569 Malignant neoplasm of unspecified ovary: Secondary | ICD-10-CM | POA: Insufficient documentation

## 2023-08-27 DIAGNOSIS — Z952 Presence of prosthetic heart valve: Secondary | ICD-10-CM | POA: Insufficient documentation

## 2023-08-27 DIAGNOSIS — N183 Chronic kidney disease, stage 3 unspecified: Secondary | ICD-10-CM

## 2023-08-27 DIAGNOSIS — T451X5A Adverse effect of antineoplastic and immunosuppressive drugs, initial encounter: Secondary | ICD-10-CM

## 2023-08-27 MED ORDER — IOHEXOL 300 MG/ML  SOLN
100.0000 mL | Freq: Once | INTRAMUSCULAR | Status: AC | PRN
  Administered 2023-08-27: 100 mL via INTRAVENOUS

## 2023-08-27 MED ORDER — SODIUM CHLORIDE 0.9 % IV SOLN: Freq: Once | INTRAVENOUS | Status: AC

## 2023-08-27 MED ORDER — SODIUM CHLORIDE 0.9% FLUSH
10.0000 mL | INTRAVENOUS | Status: DC | PRN
  Administered 2023-08-27: 10 mL via INTRAVENOUS

## 2023-08-27 MED ORDER — SODIUM CHLORIDE 0.9 % IV SOLN
125.0000 mg | Freq: Once | INTRAVENOUS | Status: AC
  Administered 2023-08-27: 125 mg via INTRAVENOUS
  Filled 2023-08-27: qty 10

## 2023-08-27 MED ORDER — HEPARIN SOD (PORK) LOCK FLUSH 100 UNIT/ML IV SOLN
500.0000 [IU] | Freq: Once | INTRAVENOUS | Status: DC
  Administered 2023-08-27: 500 [IU] via INTRAVENOUS

## 2023-08-27 NOTE — Patient Instructions (Signed)
Sodium Ferric Gluconate Complex Injection What is this medication? SODIUM FERRIC GLUCONATE COMPLEX (SOE dee um FER ik GLOO koe nate KOM pleks) treats low levels of iron (iron deficiency anemia) in people with kidney disease. Iron is a mineral that plays an important role in making red blood cells, which carry oxygen from your lungs to the rest of your body. This medicine may be used for other purposes; ask your health care provider or pharmacist if you have questions. COMMON BRAND NAME(S): Ferrlecit, Nulecit What should I tell my care team before I take this medication? They need to know if you have any of the following conditions: Anemia that is not from iron deficiency High levels of iron in the blood An unusual or allergic reaction to iron, other medications, foods, dyes, or preservatives Pregnant or are trying to become pregnant Breast-feeding How should I use this medication? This medication is injected into a vein. It is given by your care team in a hospital or clinic setting. Talk to your care team about the use of this medication in children. While it may be prescribed for children as young as 6 years for selected conditions, precautions do apply. Overdosage: If you think you have taken too much of this medicine contact a poison control center or emergency room at once. NOTE: This medicine is only for you. Do not share this medicine with others. What if I miss a dose? It is important not to miss your dose. Call your care team if you are unable to keep an appointment. What may interact with this medication? Do not take this medication with any of the following: Deferasirox Deferoxamine Dimercaprol This medication may also interact with the following: Other iron products This list may not describe all possible interactions. Give your health care provider a list of all the medicines, herbs, non-prescription drugs, or dietary supplements you use. Also tell them if you smoke, drink  alcohol, or use illegal drugs. Some items may interact with your medicine. What should I watch for while using this medication? Your condition will be monitored carefully while you are receiving this medication. Visit your care team for regular checks on your progress. You may need blood work while you are taking this medication. What side effects may I notice from receiving this medication? Side effects that you should report to your care team as soon as possible: Allergic reactions--skin rash, itching, hives, swelling of the face, lips, tongue, or throat Low blood pressure--dizziness, feeling faint or lightheaded, blurry vision Shortness of breath Side effects that usually do not require medical attention (report to your care team if they continue or are bothersome): Flushing Headache Joint pain Muscle pain Nausea Pain, redness, or irritation at injection site This list may not describe all possible side effects. Call your doctor for medical advice about side effects. You may report side effects to FDA at 1-800-FDA-1088. Where should I keep my medication? This medication is given in a hospital or clinic and will not be stored at home. NOTE: This sheet is a summary. It may not cover all possible information. If you have questions about this medicine, talk to your doctor, pharmacist, or health care provider.  2024 Elsevier/Gold Standard (2020-11-03 00:00:00)

## 2023-08-27 NOTE — Patient Instructions (Signed)

## 2023-08-29 ENCOUNTER — Ambulatory Visit: Payer: 59 | Attending: Internal Medicine

## 2023-08-29 DIAGNOSIS — Z5181 Encounter for therapeutic drug level monitoring: Secondary | ICD-10-CM | POA: Diagnosis not present

## 2023-08-29 DIAGNOSIS — Z952 Presence of prosthetic heart valve: Secondary | ICD-10-CM

## 2023-08-29 DIAGNOSIS — I480 Paroxysmal atrial fibrillation: Secondary | ICD-10-CM

## 2023-08-29 DIAGNOSIS — Z953 Presence of xenogenic heart valve: Secondary | ICD-10-CM

## 2023-08-29 LAB — POCT INR: INR: 2.6 (ref 2.0–3.0)

## 2023-08-29 NOTE — Patient Instructions (Signed)
 NO PRINTOUT. Continue taking warfarin 1/2 tablet daily except for 1 tablet on Tuesdays, Thursdays, and Saturdays.  Recheck INR in 5 weeks.  [facility] [phone]

## 2023-09-01 ENCOUNTER — Other Ambulatory Visit: Payer: Self-pay | Admitting: Hematology & Oncology

## 2023-09-04 ENCOUNTER — Other Ambulatory Visit: Payer: Self-pay | Admitting: Hematology & Oncology

## 2023-09-04 ENCOUNTER — Other Ambulatory Visit: Payer: Self-pay | Admitting: Physician Assistant

## 2023-09-04 DIAGNOSIS — F419 Anxiety disorder, unspecified: Secondary | ICD-10-CM

## 2023-09-04 DIAGNOSIS — I5022 Chronic systolic (congestive) heart failure: Secondary | ICD-10-CM

## 2023-09-08 ENCOUNTER — Telehealth: Payer: Self-pay | Admitting: Hematology & Oncology

## 2023-09-08 NOTE — Telephone Encounter (Signed)
 Called lvm to move appointment on 09/09/23 with either Sarah or r/s with Dr Myna Hidalgo.

## 2023-09-09 ENCOUNTER — Encounter: Payer: Self-pay | Admitting: Hematology & Oncology

## 2023-09-09 ENCOUNTER — Inpatient Hospital Stay: Payer: 59

## 2023-09-09 ENCOUNTER — Encounter: Payer: Self-pay | Admitting: Family

## 2023-09-09 ENCOUNTER — Inpatient Hospital Stay (HOSPITAL_BASED_OUTPATIENT_CLINIC_OR_DEPARTMENT_OTHER): Payer: 59 | Admitting: Family

## 2023-09-09 ENCOUNTER — Other Ambulatory Visit: Payer: Self-pay

## 2023-09-09 VITALS — BP 114/70 | HR 77 | Temp 97.5°F | Wt 161.8 lb

## 2023-09-09 DIAGNOSIS — D500 Iron deficiency anemia secondary to blood loss (chronic): Secondary | ICD-10-CM

## 2023-09-09 DIAGNOSIS — D3910 Neoplasm of uncertain behavior of unspecified ovary: Secondary | ICD-10-CM

## 2023-09-09 DIAGNOSIS — Z95828 Presence of other vascular implants and grafts: Secondary | ICD-10-CM

## 2023-09-09 DIAGNOSIS — E039 Hypothyroidism, unspecified: Secondary | ICD-10-CM

## 2023-09-09 DIAGNOSIS — D6481 Anemia due to antineoplastic chemotherapy: Secondary | ICD-10-CM

## 2023-09-09 DIAGNOSIS — T451X5A Adverse effect of antineoplastic and immunosuppressive drugs, initial encounter: Secondary | ICD-10-CM | POA: Diagnosis not present

## 2023-09-09 DIAGNOSIS — N183 Chronic kidney disease, stage 3 unspecified: Secondary | ICD-10-CM

## 2023-09-09 DIAGNOSIS — D508 Other iron deficiency anemias: Secondary | ICD-10-CM | POA: Diagnosis not present

## 2023-09-09 DIAGNOSIS — F419 Anxiety disorder, unspecified: Secondary | ICD-10-CM

## 2023-09-09 LAB — CBC WITH DIFFERENTIAL (CANCER CENTER ONLY)
Abs Immature Granulocytes: 0.03 10*3/uL (ref 0.00–0.07)
Basophils Absolute: 0 10*3/uL (ref 0.0–0.1)
Basophils Relative: 0 %
Eosinophils Absolute: 0.1 10*3/uL (ref 0.0–0.5)
Eosinophils Relative: 2 %
HCT: 33 % — ABNORMAL LOW (ref 36.0–46.0)
Hemoglobin: 10.9 g/dL — ABNORMAL LOW (ref 12.0–15.0)
Immature Granulocytes: 0 %
Lymphocytes Relative: 9 %
Lymphs Abs: 0.7 10*3/uL (ref 0.7–4.0)
MCH: 29.1 pg (ref 26.0–34.0)
MCHC: 33 g/dL (ref 30.0–36.0)
MCV: 88 fL (ref 80.0–100.0)
Monocytes Absolute: 0.7 10*3/uL (ref 0.1–1.0)
Monocytes Relative: 9 %
Neutro Abs: 5.7 10*3/uL (ref 1.7–7.7)
Neutrophils Relative %: 80 %
Platelet Count: 152 10*3/uL (ref 150–400)
RBC: 3.75 MIL/uL — ABNORMAL LOW (ref 3.87–5.11)
RDW: 15.9 % — ABNORMAL HIGH (ref 11.5–15.5)
WBC Count: 7.2 10*3/uL (ref 4.0–10.5)
nRBC: 0 % (ref 0.0–0.2)

## 2023-09-09 LAB — CMP (CANCER CENTER ONLY)
ALT: 7 U/L (ref 0–44)
AST: 13 U/L — ABNORMAL LOW (ref 15–41)
Albumin: 3.8 g/dL (ref 3.5–5.0)
Alkaline Phosphatase: 46 U/L (ref 38–126)
Anion gap: 7 (ref 5–15)
BUN: 7 mg/dL — ABNORMAL LOW (ref 8–23)
CO2: 27 mmol/L (ref 22–32)
Calcium: 8.4 mg/dL — ABNORMAL LOW (ref 8.9–10.3)
Chloride: 103 mmol/L (ref 98–111)
Creatinine: 0.97 mg/dL (ref 0.44–1.00)
GFR, Estimated: >60 mL/min (ref >60)
Glucose, Bld: 86 mg/dL (ref 70–99)
Potassium: 3.5 mmol/L (ref 3.5–5.1)
Sodium: 137 mmol/L (ref 135–145)
Total Bilirubin: 0.4 mg/dL (ref 0.0–1.2)
Total Protein: 6 g/dL — ABNORMAL LOW (ref 6.5–8.1)

## 2023-09-09 LAB — IRON AND IRON BINDING CAPACITY (CC-WL,HP ONLY)
Iron: 68 ug/dL (ref 28–170)
Saturation Ratios: 21 % (ref 10.4–31.8)
TIBC: 328 ug/dL (ref 250–450)
UIBC: 260 ug/dL (ref 148–442)

## 2023-09-09 LAB — LACTATE DEHYDROGENASE: LDH: 157 U/L (ref 98–192)

## 2023-09-09 LAB — FERRITIN: Ferritin: 69 ng/mL (ref 11–307)

## 2023-09-09 MED ORDER — SODIUM CHLORIDE 0.9% FLUSH
10.0000 mL | INTRAVENOUS | Status: AC | PRN
  Administered 2023-09-09: 10 mL via INTRAVENOUS

## 2023-09-09 MED ORDER — HEPARIN SOD (PORK) LOCK FLUSH 100 UNIT/ML IV SOLN
500.0000 [IU] | Freq: Once | INTRAVENOUS | Status: AC
Start: 2023-09-09 — End: 2023-09-09
  Administered 2023-09-09: 500 [IU] via INTRAVENOUS

## 2023-09-09 NOTE — Addendum Note (Signed)
 Addended by: Neita Goodnight on: 09/09/2023 02:34 PM   Modules accepted: Orders

## 2023-09-09 NOTE — Patient Instructions (Signed)

## 2023-09-09 NOTE — Addendum Note (Signed)
 Addended by: Margaretha Seeds on: 09/09/2023 02:06 PM   Modules accepted: Orders

## 2023-09-09 NOTE — Progress Notes (Signed)
 Hematology and Oncology Follow Up Visit  [name] [id number] 09-27-1960 63 y.o. 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 She is doing well and is feeling much better on her break from treatment. Her energy has improved.  No issue with infections.  No fever, chills, n/v, cough, rash, dizziness, SOB, chest pain, palpitations, abdominal pain or changes in bowel or bladder habits at this time.  Recent CT scan a small improvement in her disease.  Inhibin B was also down to 60.  No swelling in her extremities at this time.  No falls or syncope reported.  Appetite comes and goes. She is going to add a daily protein drink to help with protein and carb intake. Hydration is good. Weight is stable at 161 lbs.  She has not noted any blood loss. No abnormal bruising or petechiae.   ECOG Performance Status: 1 - Symptomatic but completely ambulatory  Medications:  Allergies as of 09/09/2023       Reactions   Augmentin [amoxicillin-pot Clavulanate] Other (See Comments)   Muscle Aches   Prochlorperazine Edisylate Other (See Comments)   Nervous/ flutter/ shakes   Adhesive [tape] Hives   Redness/hives from adhesive tape( tolerates latex gloves); tolerates paper tape        Medication List        Accurate as of September 09, 2023  2:04 PM. If you have any questions, ask your nurse or doctor.          albuterol 108 (90 Base) MCG/ACT inhaler Commonly known as: VENTOLIN HFA TAKE 2 PUFFS BY MOUTH EVERY 6 HOURS AS NEEDED FOR WHEEZE OR SHORTNESS OF BREATH   amoxicillin 500 MG tablet Commonly known as:  AMOXIL Take 2,000 mg by mouth See admin instructions. Take 2,000 mg one hour prior to dental visits.   aspirin EC 81 MG tablet Take 81 mg by mouth daily.   azithromycin 500 MG tablet Commonly known as: ZITHROMAX Take 500 mg by mouth daily.   b complex vitamins capsule Take 1 capsule by mouth 2 (two) times daily.   carvedilol 3.125 MG tablet Commonly known as: COREG TAKE ONE TABLET BY MOUTH TWICE DAILY WITH A MEAL   cholecalciferol 25 MCG (1000 UNIT) tablet Commonly known as: VITAMIN D3 Take 4,000 Units by mouth daily.   furosemide 20 MG tablet Commonly known as: LASIX TAKE ONE TABLET BY MOUTH EVERY DAY   levothyroxine 100 MCG tablet Commonly known as: SYNTHROID Take 1 tablet (100 mcg total) by mouth daily before breakfast.   lidocaine-prilocaine cream Commonly known as: EMLA Apply 1 Application topically as needed. Apply to port per instructions   loperamide 2 MG tablet Commonly known as: IMODIUM A-D Take 4 mg by mouth 3 (three) times daily as needed for diarrhea or loose stools.   LORazepam 1 MG tablet Commonly known as: ATIVAN TAKE ONE TABLET BY MOUTH EVERY 8 HOURS AS NEEDED FOR ANXIETY   methocarbamol 500 MG tablet Commonly known as: ROBAXIN TAKE ONE TABLET BY MOUTH EVERY 6 HOURS AS NEEDED FOR MUSCLE SPASMS   Potassium Chloride  ER 20 MEQ Tbcr Take 1 tablet (20 mEq total) by mouth daily.   potassium chloride SA 20 MEQ tablet Commonly known as: KLOR-CON M Take 20 mEq by mouth daily.   sodium chloride 0.9 % SOLN 50 mL with pembrolizumab 100 MG/4ML SOLN 200 mg Inject 200 mg into the vein every 21 ( twenty-one) days.   spironolactone 25 MG tablet Commonly known as: ALDACTONE Take 1 tablet (25 mg total) by mouth daily.   traMADol 50 MG tablet Commonly known as: ULTRAM TAKE ONE TABLET BY MOUTH EVERY 6 HOURS AS NEEDED   Vitamin D (Ergocalciferol) 1.25 MG (50000 UNIT) Caps capsule Commonly known as: DRISDOL TAKE ONE CAPSULE BY MOUTH every 7 DAYS   warfarin  7.5 MG tablet Commonly known as: COUMADIN Take as directed by the anticoagulation clinic. If you are unsure how to take this medication, talk to your nurse or doctor. Original instructions: Take 1/2 tablet to 1 tablet by mouth daily as directed by Anticoagulation Clinic.   zolpidem 10 MG tablet Commonly known as: AMBIEN TAKE ONE TABLET BY MOUTH EVERY DAY        Allergies:  Allergies  Allergen Reactions   Augmentin [Amoxicillin-Pot Clavulanate] Other (See Comments)    Muscle Aches   Prochlorperazine Edisylate Other (See Comments)    Nervous/ flutter/ shakes   Adhesive [Tape] Hives    Redness/hives from adhesive tape( tolerates latex gloves); tolerates paper tape    Past Medical History, Surgical history, Social history, and Family History were reviewed and updated.  Review of Systems: All other 10 point review of systems is negative.   Physical Exam:  weight is 161 lb 12.8 oz (73.4 kg). Her oral temperature is 97.5 F (36.4 C) (abnormal). Her blood pressure is 114/70 and her pulse is 77. Her oxygen saturation is 100%.   Wt Readings from Last 3 Encounters:  09/09/23 161 lb 12.8 oz (73.4 kg)  08/20/23 157 lb (71.2 kg)  08/12/23 158 lb (71.7 kg)    Ocular: Sclerae unicteric, pupils equal, round and reactive to light Ear-nose-throat: Oropharynx clear, dentition fair Lymphatic: No cervical or supraclavicular adenopathy Lungs no rales or rhonchi, good excursion bilaterally Heart regular rate and rhythm, no murmur appreciated Abd soft, nontender, positive bowel sounds MSK no focal spinal tenderness, no joint edema Neuro: non-focal, well-oriented, appropriate affect Breasts: Deferred   Lab Results  Component Value Date   WBC 7.2 09/09/2023   HGB 10.9 (L) 09/09/2023   HCT 33.0 (L) 09/09/2023   MCV 88.0 09/09/2023   PLT 152 09/09/2023   Lab Results  Component Value Date   FERRITIN 14 08/12/2023   IRON 59 08/12/2023   TIBC 413 08/12/2023   UIBC 354 08/12/2023    IRONPCTSAT 14 08/12/2023   Lab Results  Component Value Date   RETICCTPCT 1.3 08/12/2023   RBC 3.75 (L) 09/09/2023   No results found for: "KPAFRELGTCHN", "LAMBDASER", "KAPLAMBRATIO" No results found for: "IGGSERUM", "IGA", "IGMSERUM" No results found for: "TOTALPROTELP", "ALBUMINELP", "A1GS", "A2GS", "BETS", "BETA2SER", "GAMS", "MSPIKE", "SPEI"   Chemistry      Component Value Date/Time   NA 137 09/09/2023 1250   NA 139 08/14/2020 1428   NA 140 03/15/2016 0953   K 3.5 09/09/2023 1250   K 3.9 03/15/2016 0953   CL 103 09/09/2023 1250   CL 102 12/13/2015 0803   CO2 27 09/09/2023 1250   CO2 19 (L) 03/15/2016 0953   BUN 7 (L) 09/09/2023 1250   BUN 16 08/14/2020 1428   BUN 15.9  03/15/2016 0953   CREATININE 0.97 09/09/2023 1250   CREATININE 1.4 (H) 03/15/2016 0953      Component Value Date/Time   CALCIUM 8.4 (L) 09/09/2023 1250   CALCIUM 9.3 03/15/2016 0953   ALKPHOS 46 09/09/2023 1250   ALKPHOS 55 03/15/2016 0953   AST 13 (L) 09/09/2023 1250   AST 17 03/15/2016 0953   ALT 7 09/09/2023 1250   ALT 23 03/15/2016 0953   BILITOT 0.4 09/09/2023 1250   BILITOT 0.38 03/15/2016 0953       Impression and Plan: Ms. Snodgrass is a very pleasant 63 yo caucasian female with recurrent granulosa cell tumor on immunotherapy. We are trying to avoid systemic chemotherapy if possible.  Her [name] is currently on hold for fatigue. She is feeling much better.  We will give her another month off of treatment.  Inhibin B and iron studies pending.  MD follow-up in 1 month to discuss future treatment plan.   Eileen Stanford, NP 3/18/20252:04 PM

## 2023-09-11 LAB — INHIBIN B: Inhibin B: 63.6 pg/mL — ABNORMAL HIGH (ref 0.0–16.9)

## 2023-09-15 ENCOUNTER — Ambulatory Visit: Payer: 59 | Admitting: Physician Assistant

## 2023-09-15 ENCOUNTER — Encounter: Payer: Self-pay | Admitting: Physician Assistant

## 2023-09-15 ENCOUNTER — Ambulatory Visit: Payer: 59 | Attending: Physician Assistant | Admitting: Physician Assistant

## 2023-09-15 VITALS — BP 90/66 | HR 73 | Ht 66.0 in | Wt 163.8 lb

## 2023-09-15 DIAGNOSIS — Z952 Presence of prosthetic heart valve: Secondary | ICD-10-CM

## 2023-09-15 DIAGNOSIS — I480 Paroxysmal atrial fibrillation: Secondary | ICD-10-CM | POA: Diagnosis not present

## 2023-09-15 DIAGNOSIS — I2510 Atherosclerotic heart disease of native coronary artery without angina pectoris: Secondary | ICD-10-CM

## 2023-09-15 DIAGNOSIS — Z95810 Presence of automatic (implantable) cardiac defibrillator: Secondary | ICD-10-CM

## 2023-09-15 DIAGNOSIS — I5022 Chronic systolic (congestive) heart failure: Secondary | ICD-10-CM

## 2023-09-15 DIAGNOSIS — I255 Ischemic cardiomyopathy: Secondary | ICD-10-CM

## 2023-09-15 MED ORDER — FUROSEMIDE 20 MG PO TABS: 20.0000 mg | ORAL_TABLET | Freq: Every day | ORAL | 3 refills | Status: AC

## 2023-09-15 MED ORDER — CARVEDILOL 3.125 MG PO TABS: 3.1250 mg | ORAL_TABLET | Freq: Two times a day (BID) | ORAL | 3 refills | Status: AC

## 2023-09-15 MED ORDER — SPIRONOLACTONE 25 MG PO TABS: 25.0000 mg | ORAL_TABLET | Freq: Every day | ORAL | 3 refills | Status: AC

## 2023-09-15 NOTE — Progress Notes (Signed)
 Office Visit    Patient Name: [name] Date of Encounter: 09/15/2023  PCP:  Josph Macho, MD    [facility]  Cardiologist:  Meriam Sprague, MD (Inactive)  Advanced Practice Provider:  No care team member to display Electrophysiologist:  None   HPI    [name] is a 63 y.o. 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  She was seen 2 days prior to last apt by EP.  Overall she was doing okay.  She was anxiously awaiting information back from oncology.  Cardiac wise she had no complaints.  No chest pain, palpitations, shortness of breath, dizzy spells, near syncope/syncope, shocks, or bleeding.  She was seen by me 09/03/21, and she stated that she was hurting all over yesterday and overall had a bad day.  Today she feels much better.  She states that at times her blood pressure gets on the low side but she denies any dizziness or lightheadedness.  She denies palpitations and chest pain.  She states that she got her defibrillator checked on Friday and it states that she was in atrial fibrillation 1 time per year.  She is usually asymptomatic.  We discussed a surveillance echocardiogram for her TMVR and she preferred to do this next March.  She is asymptomatic at this time and denied any lower extremity edema or weight gain due to fluid.  She appeared euvolemic on exam today.   Otherwise, she is doing well from a CV standpoint.  She was seen March 2024 by me.She tells me she does not have any symptoms.  No chest pains or shortness of breath.  No palpitations.  No episodes of atrial fibrillation that she is aware of.  She gets checked every 6 weeks and her last INR was 2.4.  We have requested a copy of her most recent lab work by her primary care.  She is asking if she can also defibrillator testing today.  BP is low normal but she is asymptomatic and her blood pressure always runs low.  Reports no shortness of breath nor dyspnea on exertion. Reports no chest pain, pressure, or tightness. No edema, orthopnea, PND. Reports no palpitations.    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 She reports no chest pain, shortness of breath, or episodes of atrial fibrillation. Her Coumadin management has been relatively stable, with occasional fluctuations in INR requiring dose adjustments.  Regarding her cancer, she has been on Keytruda for the past six to eight months. However, due to persistent fatigue and low hemoglobin, her oncologist paused the [facility] treatment and initiated iron infusions, which have improved her energy levels. She has multiple tumors, which have been decreasing in size under the [facility] treatment. However, due to previous radiation treatments, surgical intervention is not an option.  The patient also reports occasional swelling in her left foot, which she manages by maintaining a high  fluid intake. She denies any symptoms of lightheadedness or dizziness related to her low blood pressure.  Reports no shortness of breath nor dyspnea on exertion. Reports no chest pain, pressure, or tightness. No orthopnea, PND. Reports no palpitations.   Discussed the use of AI scribe software for clinical note transcription with the patient, who gave verbal consent to  proceed.  Past Medical History    Past Medical History:  Diagnosis Date   Arthritis    "everywhere"   Asthma    as a child   Automatic implantable cardioverter-defibrillator in situ    DR. Rosette Reveal    Bladder cancer [facility]) 2009   "injected medicine to get rid of it"   Chronic systolic CHF (congestive heart failure) (HCC)    CKD (chronic kidney disease), stage III (HCC)    Coronary artery disease    a. large RV/inferior MI complicated by pseudoaneurysm s/p aneurysemectomy and CABG 10/2009 with papillary muscle rupture requiring bioprosthetic mitral valve replacement in [facility].   Granulosa cell carcinoma of ovary (HCC)    recurrent - surgical resection 2007, 2014 and 2016    H/O thymectomy    High cholesterol    History of radiation therapy    Pelvis- 11/20/22-12/03/22- Dr. Antony Blackbird   Hypothyroidism    ICD (implantable cardiac defibrillator) in place    Iron deficiency anemia due to chronic blood loss 08/16/2020   Ischemic cardiomyopathy    a.  ICM and VT arrest 10/2009 s/p AICD (revision 01/2012 due to RV lead problem).   Myocardial infarction (HCC) 10/14/2009   Neuropathy    FEET AND HANDS - FROM CHEMO - BUT MUCH IMPROVED   PAF (paroxysmal atrial fibrillation) (HCC)    Pain    JOINT PAINS AND MUSCLE ACHES ALL OVER.   Port-A-Cath in place    RIGHT UPPER CHEST   Prosthetic valve dysfunction    S/P mitral valve replacement with bioprosthetic valve 10/21/2009   Edwards Perimount stented bovine pericardial tissue valve, size 29 mm   S/P valve-in-valve transcatheter mitral valve replacement 09/16/2017   29 mm Edwards Sapien 3 transcatheter heart valve placed via transapical approach   SVT (supraventricular tachycardia) (HCC)    Tobacco abuse    Ventricular tachycardia (HCC)    Past Surgical History:  Procedure Laterality Date   ABDOMINAL HYSTERECTOMY N/A 07/27/2013   Procedure: TUMOR EXCISION OF ABDOMINAL MASS;  Surgeon: Jeannette Corpus, MD;  Location: [facility] ORS;   Service: Gynecology;  Laterality: N/A;   APPENDECTOMY  1989   BILATERAL OOPHORECTOMY  2007   CARDIAC DEFIBRILLATOR PLACEMENT  02/06/2012   "lead change"   CARDIAC VALVE REPLACEMENT     CYSTOSCOPY Right 02/23/2013   Procedure: CYSTOSCOPY WITH STENT REMOVAL;  Surgeon: Jeannette Corpus, MD;  Location: [facility];  Service: Gynecology;  Laterality: Right;   ICD GENERATOR CHANGEOUT N/A 04/03/2020   Procedure: ICD GENERATOR CHANGEOUT;  Surgeon: Marinus Maw, MD;  Location: [facility] INVASIVE CV LAB;  Service: Cardiovascular;  Laterality: N/A;   IMPLANTABLE CARDIOVERTER DEFIBRILLATOR REVISION N/A 02/06/2012   Procedure: IMPLANTABLE CARDIOVERTER DEFIBRILLATOR REVISION;  Surgeon: Marinus Maw, MD;  Location: [facility] CATH LAB;  Service: Cardiovascular;  Laterality: N/A;   INSERT / REPLACE / REMOVE PACEMAKER  10/2009   DUAL-CHAMBER [place]. JUDE DEFIBRILLATOR   IR IMAGING GUIDED PORT INSERTION  05/11/2018   LAPAROTOMY N/A 01/19/2013   Procedure: TUMOR DEBULKING / BOWEL RESECTION/INSERTION RIGHT UTERERAL DOUBLE J STENT/OMENTECTOMY;  Surgeon: Jeannette Corpus, MD;  Location: [facility];  Service: Gynecology;  Laterality: N/A;   MITRAL VALVE REPLACEMENT  10/21/2009   29mm Edwards Perimount bovine pericardial tissue valve - [place] Foley   RIGHT/LEFT HEART CATH AND CORONARY ANGIOGRAPHY N/A 07/02/2017   Procedure: RIGHT/LEFT HEART CATH AND CORONARY ANGIOGRAPHY;  Surgeon: Lyn Records, MD;  Location: [facility] INVASIVE CV LAB;  Service: Cardiovascular;  Laterality: N/A;   TEE WITHOUT CARDIOVERSION N/A 09/16/2017   Procedure: TRANSESOPHAGEAL ECHOCARDIOGRAM (TEE);  Surgeon: Tonny Bollman, MD;  Location: [facility] OR;  Service: Open Heart Surgery;  Laterality: N/A;   THYMECTOMY  2009   TONSILLECTOMY AND ADENOIDECTOMY  ~ 1967   TOTAL HIP ARTHROPLASTY  05/06/2012   Procedure: TOTAL HIP ARTHROPLASTY;  Surgeon: Loanne Drilling, MD;  Location: [facility]S;  Service: Orthopedics;  Laterality: Right;   TRANSCATHETER MITRAL VALVE REPLACEMENT,  TRANSAPICAL N/A 09/16/2017   Procedure: TRANSCATHETER MITRAL VALVE REPLACEMENT,TRANSAPICAL;  Surgeon: Tonny Bollman, MD;  Location: [facility] OR;  Service: Open Heart Surgery;  Laterality: N/A;   TRANSURETHRAL RESECTION OF BLADDER  2009   "for bladder cancer"   TUBAL LIGATION  1993   VAGINAL HYSTERECTOMY  2001    Allergies  Allergies  Allergen Reactions   Augmentin [Amoxicillin-Pot Clavulanate] Other (See Comments)    Muscle Aches   Prochlorperazine Edisylate Other (See Comments)    Nervous/ flutter/ shakes   Adhesive [Tape] Hives    Redness/hives from adhesive tape( tolerates latex gloves); tolerates paper tape     EKGs/Labs/Other Studies Reviewed:   The following studies were reviewed today:  08/14/2020 echocardiogram  IMPRESSIONS    1. 29 mm S3 ViV TMVR. Emax 1.7 m/s, MG 6 mmHG @ 80 bpm, EOA 2.17 cm2, DVI  1.6. Normal prosthesis. No evidence of LVOT obstruction. The mitral valve  has been repaired/replaced. No evidence of mitral valve regurgitation.  There is a 29 mm Sapien 3 ViV TMVR   present in the mitral position. Procedure Date: 09/16/17.   2. Left ventricular ejection fraction, by estimation, is 45 to 50%. The  left ventricle has mildly decreased function. The left ventricle has no  regional wall motion abnormalities. Left ventricular diastolic function  could not be evaluated.   3. Right ventricular systolic function is normal. The right ventricular  size is normal. Tricuspid regurgitation signal is inadequate for assessing  PA pressure.   4. Left atrial size was mildly dilated.   5. The aortic valve is tricuspid. There is mild calcification of the  aortic valve. There is mild thickening of the aortic valve. Aortic valve  regurgitation is not visualized. Mild aortic valve sclerosis is present,  with no evidence of aortic valve  stenosis.   6. The inferior vena cava is normal in size with greater than 50%  respiratory variability, suggesting right atrial pressure of  3 mmHg.   Comparison(s): No significant change from prior study. EF remains ~45%.  Basal to mid inferior wall is aneurysmal. Apex appears akinetic. TMVR is  stable.   EKG:  EKG is ordered today. It showed NSR in 80 bpm, stable when compared to other EKGs  Recent Labs: 06/20/2023: TSH 1.027 09/09/2023: ALT 7; BUN 7; Creatinine 0.97; Hemoglobin 10.9; Platelet Count 152; Potassium 3.5; Sodium 137  Recent Lipid Panel    Component Value Date/Time   CHOL 247 (H) 09/18/2017 0240   TRIG 144 09/18/2017 0240   HDL 37 (L) 09/18/2017 0240   CHOLHDL 6.7 09/18/2017 0240   VLDL 29 09/18/2017 0240   LDLCALC 181 (H) 09/18/2017 0240  Home Medications   Current Meds  Medication Sig   albuterol (VENTOLIN HFA) 108 (90 Base) MCG/ACT inhaler TAKE 2 PUFFS BY MOUTH EVERY 6 HOURS AS NEEDED FOR WHEEZE OR SHORTNESS OF BREATH   amoxicillin (AMOXIL) 500 MG tablet Take 2,000 mg by mouth See admin instructions. Take 2,000 mg one hour prior to dental visits.   aspirin EC 81 MG tablet Take 81 mg by mouth daily.   azithromycin (ZITHROMAX) 500 MG tablet Take 500 mg by mouth daily.   b complex vitamins capsule Take 1 capsule by mouth 2 (two) times daily.   cholecalciferol (VITAMIN D3) 25 MCG (1000 UNIT) tablet Take 4,000 Units by mouth daily.   levothyroxine (SYNTHROID) 100 MCG tablet Take 1 tablet (100 mcg total) by mouth daily before breakfast.   lidocaine-prilocaine (EMLA) cream Apply 1 Application topically as needed. Apply to port per instructions   loperamide (IMODIUM A-D) 2 MG tablet Take 4 mg by mouth 3 (three) times daily as needed for diarrhea or loose stools.   LORazepam (ATIVAN) 1 MG tablet TAKE ONE TABLET BY MOUTH EVERY 8 HOURS AS NEEDED FOR ANXIETY   methocarbamol (ROBAXIN) 500 MG tablet TAKE ONE TABLET BY MOUTH EVERY 6 HOURS AS NEEDED FOR MUSCLE SPASMS   Potassium Chloride ER 20 MEQ TBCR Take 1 tablet (20 mEq total) by mouth daily.   potassium chloride SA (KLOR-CON M) 20 MEQ tablet Take 20 mEq by  mouth daily.   sodium chloride 0.9 % SOLN 50 mL with pembrolizumab 100 MG/4ML SOLN 200 mg Inject 200 mg into the vein every 21 ( twenty-one) days.   traMADol (ULTRAM) 50 MG tablet TAKE ONE TABLET BY MOUTH EVERY 6 HOURS AS NEEDED   Vitamin D, Ergocalciferol, (DRISDOL) 1.25 MG (50000 UNIT) CAPS capsule TAKE ONE CAPSULE BY MOUTH every 7 DAYS   warfarin (COUMADIN) 7.5 MG tablet Take 1/2 tablet to 1 tablet by mouth daily as directed by Anticoagulation Clinic.   zolpidem (AMBIEN) 10 MG tablet TAKE ONE TABLET BY MOUTH EVERY DAY   [DISCONTINUED] carvedilol (COREG) 3.125 MG tablet TAKE ONE TABLET BY MOUTH TWICE DAILY WITH A MEAL   [DISCONTINUED] furosemide (LASIX) 20 MG tablet TAKE ONE TABLET BY MOUTH EVERY DAY   [DISCONTINUED] spironolactone (ALDACTONE) 25 MG tablet Take 1 tablet (25 mg total) by mouth daily.     Review of Systems      All other systems reviewed and are otherwise negative except as noted above.  Physical Exam    VS:  BP 90/66   Pulse 73   Ht 5\' 6"  (1.676 m)   Wt 163 lb 12.8 oz (74.3 kg)   SpO2 99%   BMI 26.44 kg/m  , BMI Body mass index is 26.44 kg/m.  Wt Readings from Last 3 Encounters:  09/15/23 163 lb 12.8 oz (74.3 kg)  09/09/23 161 lb 12.8 oz (73.4 kg)  08/20/23 157 lb (71.2 kg)     GEN: Well nourished, well developed, in no acute distress. HEENT: normal. Neck: Supple, no JVD, carotid bruits, or masses. Cardiac: RRR, no murmurs, rubs, or gallops. No clubbing, cyanosis, edema.  Radials/PT 2+ and equal bilaterally.  Respiratory:  Respirations regular and unlabored, clear to auscultation bilaterally. GI: Soft, nontender, nondistended. MS: No deformity or atrophy. Skin: Warm and dry, no rash. Neuro:  Strength and sensation are intact. Psych: Normal affect.  Assessment & Plan    CAD s/p CABG -no chest pain -continue current medications  Atrial Fibrillation No episodes since last visit. INR within therapeutic  range at 2.6. - Continue Coumadin with regular INR  monitoring. --CHA2DS2-VASc is 3 - Follow up with EP next month for device check.  Hypotension Asymptomatic with low blood pressure. Carvedilol at lowest dose may contribute. - Continue current medication regimen. -BP has been low for years  Anemia Hemoglobin at 10.9. Iron infusions improved energy levels. - Monitor hemoglobin levels. - Consider further iron infusions if necessary.  Granulosa Cell Tumor Recurrent with reduced brain tumors. Keytruda paused due to fatigue and low hemoglobin. Surgery not viable. - Monitor tumor markers and consider resuming Keytruda if markers elevate. - Continue monthly follow-ups with oncologist.  Status post transcatheter mitral valve replacement -originally replaced surgically with bioprosthetic valve, then TMVR -Asymptomatic -No murmur on exam  Follow-up Requires ongoing cardiac and oncological follow-up. Prefers female cardiologist. - Schedule follow-up with cardiologist in one year. - Refer to Dr. Cristal Deer or Dr. Jacques Navy for new cardiologist. - Ensure EP appointment on 4/21 for defibrillator check.   Disposition: Follow up  1 year  with APP or MD  Signed, Sharlene Dory, PA-C 09/15/2023, 1:50 PM [facility]

## 2023-09-15 NOTE — Patient Instructions (Signed)
 Medication Instructions:  Your physician recommends that you continue on your current medications as directed. Please refer to the Current Medication list given to you today.  *If you need a refill on your cardiac medications before your next appointment, please call your pharmacy*   Lab Work: NONE If you have labs (blood work) drawn today and your tests are completely normal, you will receive your results only by: MyChart Message (if you have MyChart) OR A paper copy in the mail If you have any lab test that is abnormal or we need to change your treatment, we will call you to review the results.   Testing/Procedures: NONE   Follow-Up: At [facility], you and your health needs are our priority.  As part of our continuing mission to provide you with exceptional heart care, we have created designated Provider Care Teams.  These Care Teams include your primary Cardiologist (physician) and Advanced Practice Providers (APPs -  Physician Assistants and Nurse Practitioners) who all work together to provide you with the care you need, when you need it.  We recommend signing up for the patient portal called "MyChart".  Sign up information is provided on this After Visit Summary.  MyChart is used to connect with patients for Virtual Visits (Telemedicine).  Patients are able to view lab/test results, encounter notes, upcoming appointments, etc.  Non-urgent messages can be sent to your provider as well.   To learn more about what you can do with MyChart, go to [email or web address].    Your next appointment:   1 year(s)  Provider:   Cristal Deer, MD or Jacques Navy, MD  Other Instructions   1st Floor: - Lobby - Registration  - Pharmacy  - Lab - Cafe  2nd Floor: - PV Lab - Diagnostic Testing (echo, CT, nuclear med)  3rd Floor: - Vacant  4th Floor: - TCTS (cardiothoracic surgery) - AFib Clinic - Structural Heart Clinic - Vascular Surgery  - Vascular Ultrasound  5th  Floor: - [facility] Cardiology (general and EP) - Clinical Pharmacy for coumadin, hypertension, lipid, weight-loss medications, and med management appointments    Valet parking services will be available as well.

## 2023-09-19 ENCOUNTER — Other Ambulatory Visit: Payer: Self-pay | Admitting: Hematology & Oncology

## 2023-09-19 DIAGNOSIS — D3910 Neoplasm of uncertain behavior of unspecified ovary: Secondary | ICD-10-CM

## 2023-09-23 ENCOUNTER — Encounter: Payer: Self-pay | Admitting: Hematology & Oncology

## 2023-09-24 ENCOUNTER — Other Ambulatory Visit: Payer: Self-pay | Admitting: Hematology & Oncology

## 2023-09-24 DIAGNOSIS — D3910 Neoplasm of uncertain behavior of unspecified ovary: Secondary | ICD-10-CM

## 2023-09-30 ENCOUNTER — Ambulatory Visit (INDEPENDENT_AMBULATORY_CARE_PROVIDER_SITE_OTHER): Payer: 59

## 2023-09-30 DIAGNOSIS — I255 Ischemic cardiomyopathy: Secondary | ICD-10-CM

## 2023-10-01 ENCOUNTER — Encounter: Payer: Self-pay | Admitting: Internal Medicine

## 2023-10-01 LAB — CUP PACEART REMOTE DEVICE CHECK
Battery Remaining Longevity: 70 mo
Battery Remaining Percentage: 68 %
Battery Voltage: 2.99 V
Brady Statistic AP VP Percent: 1 %
Brady Statistic AP VS Percent: 3.4 %
Brady Statistic AS VP Percent: 1 %
Brady Statistic AS VS Percent: 94 %
Brady Statistic RA Percent Paced: 1 %
Brady Statistic RV Percent Paced: 1 %
Date Time Interrogation Session: 20250408031447
HighPow Impedance: 59 Ohm
Implantable Lead Connection Status: 753985
Implantable Lead Connection Status: 753985
Implantable Lead Implant Date: 20110503
Implantable Lead Implant Date: 20130815
Implantable Lead Location: 753859
Implantable Lead Location: 753860
Implantable Pulse Generator Implant Date: 20211012
Lead Channel Impedance Value: 250 Ohm
Lead Channel Impedance Value: 340 Ohm
Lead Channel Pacing Threshold Amplitude: 0.5 V
Lead Channel Pacing Threshold Amplitude: 2 V
Lead Channel Pacing Threshold Pulse Width: 0.5 ms
Lead Channel Pacing Threshold Pulse Width: 1 ms
Lead Channel Sensing Intrinsic Amplitude: 1.8 mV
Lead Channel Sensing Intrinsic Amplitude: 11.9 mV
Lead Channel Setting Pacing Amplitude: 2 V
Lead Channel Setting Pacing Amplitude: 2.25 V
Lead Channel Setting Pacing Pulse Width: 1 ms
Lead Channel Setting Sensing Sensitivity: 0.5 mV
Pulse Gen Serial Number: 111028151
Zone Setting Status: 755011

## 2023-10-03 ENCOUNTER — Ambulatory Visit: Payer: Self-pay

## 2023-10-03 ENCOUNTER — Other Ambulatory Visit: Payer: Self-pay | Admitting: Hematology & Oncology

## 2023-10-07 ENCOUNTER — Inpatient Hospital Stay: Payer: Self-pay

## 2023-10-07 ENCOUNTER — Encounter: Payer: Self-pay | Admitting: Hematology & Oncology

## 2023-10-07 ENCOUNTER — Inpatient Hospital Stay (HOSPITAL_BASED_OUTPATIENT_CLINIC_OR_DEPARTMENT_OTHER): Payer: Self-pay | Admitting: Hematology & Oncology

## 2023-10-07 ENCOUNTER — Other Ambulatory Visit: Payer: Self-pay

## 2023-10-07 ENCOUNTER — Inpatient Hospital Stay: Attending: Hematology & Oncology

## 2023-10-07 VITALS — BP 100/62 | HR 71 | Temp 98.2°F | Resp 16 | Ht 66.0 in | Wt 162.0 lb

## 2023-10-07 VITALS — BP 101/59 | HR 64 | Resp 18

## 2023-10-07 DIAGNOSIS — Z5112 Encounter for antineoplastic immunotherapy: Secondary | ICD-10-CM | POA: Insufficient documentation

## 2023-10-07 DIAGNOSIS — Z7901 Long term (current) use of anticoagulants: Secondary | ICD-10-CM | POA: Diagnosis not present

## 2023-10-07 DIAGNOSIS — Z953 Presence of xenogenic heart valve: Secondary | ICD-10-CM | POA: Diagnosis not present

## 2023-10-07 DIAGNOSIS — K909 Intestinal malabsorption, unspecified: Secondary | ICD-10-CM | POA: Insufficient documentation

## 2023-10-07 DIAGNOSIS — Z952 Presence of prosthetic heart valve: Secondary | ICD-10-CM | POA: Insufficient documentation

## 2023-10-07 DIAGNOSIS — C569 Malignant neoplasm of unspecified ovary: Secondary | ICD-10-CM | POA: Insufficient documentation

## 2023-10-07 DIAGNOSIS — D500 Iron deficiency anemia secondary to blood loss (chronic): Secondary | ICD-10-CM

## 2023-10-07 DIAGNOSIS — R978 Other abnormal tumor markers: Secondary | ICD-10-CM | POA: Insufficient documentation

## 2023-10-07 DIAGNOSIS — D508 Other iron deficiency anemias: Secondary | ICD-10-CM | POA: Insufficient documentation

## 2023-10-07 DIAGNOSIS — D6481 Anemia due to antineoplastic chemotherapy: Secondary | ICD-10-CM

## 2023-10-07 DIAGNOSIS — D3910 Neoplasm of uncertain behavior of unspecified ovary: Secondary | ICD-10-CM | POA: Diagnosis not present

## 2023-10-07 DIAGNOSIS — C786 Secondary malignant neoplasm of retroperitoneum and peritoneum: Secondary | ICD-10-CM | POA: Insufficient documentation

## 2023-10-07 DIAGNOSIS — N1831 Chronic kidney disease, stage 3a: Secondary | ICD-10-CM | POA: Diagnosis not present

## 2023-10-07 LAB — CMP (CANCER CENTER ONLY)
ALT: 9 U/L (ref 0–44)
AST: 13 U/L — ABNORMAL LOW (ref 15–41)
Albumin: 4.1 g/dL (ref 3.5–5.0)
Alkaline Phosphatase: 49 U/L (ref 38–126)
Anion gap: 10 (ref 5–15)
BUN: 11 mg/dL (ref 8–23)
CO2: 27 mmol/L (ref 22–32)
Calcium: 9 mg/dL (ref 8.9–10.3)
Chloride: 102 mmol/L (ref 98–111)
Creatinine: 1.06 mg/dL — ABNORMAL HIGH (ref 0.44–1.00)
GFR, Estimated: 59 mL/min — ABNORMAL LOW (ref >60)
Glucose, Bld: 89 mg/dL (ref 70–99)
Potassium: 3.6 mmol/L (ref 3.5–5.1)
Sodium: 139 mmol/L (ref 135–145)
Total Bilirubin: 0.3 mg/dL (ref 0.0–1.2)
Total Protein: 6.2 g/dL — ABNORMAL LOW (ref 6.5–8.1)

## 2023-10-07 LAB — CBC WITH DIFFERENTIAL (CANCER CENTER ONLY)
Abs Immature Granulocytes: 0.04 10*3/uL (ref 0.00–0.07)
Basophils Absolute: 0 10*3/uL (ref 0.0–0.1)
Basophils Relative: 1 %
Eosinophils Absolute: 0.1 10*3/uL (ref 0.0–0.5)
Eosinophils Relative: 2 %
HCT: 35.7 % — ABNORMAL LOW (ref 36.0–46.0)
Hemoglobin: 11.9 g/dL — ABNORMAL LOW (ref 12.0–15.0)
Immature Granulocytes: 1 %
Lymphocytes Relative: 8 %
Lymphs Abs: 0.7 10*3/uL (ref 0.7–4.0)
MCH: 29.2 pg (ref 26.0–34.0)
MCHC: 33.3 g/dL (ref 30.0–36.0)
MCV: 87.7 fL (ref 80.0–100.0)
Monocytes Absolute: 0.8 10*3/uL (ref 0.1–1.0)
Monocytes Relative: 9 %
Neutro Abs: 7 10*3/uL (ref 1.7–7.7)
Neutrophils Relative %: 79 %
Platelet Count: 200 10*3/uL (ref 150–400)
RBC: 4.07 MIL/uL (ref 3.87–5.11)
RDW: 15.8 % — ABNORMAL HIGH (ref 11.5–15.5)
WBC Count: 8.8 10*3/uL (ref 4.0–10.5)
nRBC: 0 % (ref 0.0–0.2)

## 2023-10-07 LAB — LACTATE DEHYDROGENASE: LDH: 175 U/L (ref 98–192)

## 2023-10-07 MED ORDER — SODIUM CHLORIDE 0.9% FLUSH
10.0000 mL | INTRAVENOUS | Status: DC | PRN
  Administered 2023-10-07: 10 mL

## 2023-10-07 MED ORDER — SODIUM CHLORIDE 0.9 % IV SOLN: Freq: Once | INTRAVENOUS | Status: AC

## 2023-10-07 MED ORDER — SODIUM CHLORIDE 0.9 % IV SOLN
200.0000 mg | Freq: Once | INTRAVENOUS | Status: AC
  Administered 2023-10-07: 200 mg via INTRAVENOUS
  Filled 2023-10-07: qty 8

## 2023-10-07 MED ORDER — HEPARIN SOD (PORK) LOCK FLUSH 100 UNIT/ML IV SOLN
500.0000 [IU] | Freq: Once | INTRAVENOUS | Status: AC | PRN
  Administered 2023-10-07: 500 [IU]

## 2023-10-07 NOTE — Progress Notes (Signed)
 Hematology and Oncology Follow Up Visit  LEVITA MONICAL [id number] June 18, 1961 63 y.o. 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 Boruff is here today for follow-up and treatment.  Overall, she is doing quite well.  She does have some problems with her I think left hip.  She does not wish to have any surgery for this.  She says on occasion the hip does seem to go out on her.  Her last Inhibin B was only steady at 64.  She has had no issues with nausea or vomiting.  She has been working quite a bit.  She been quite busy at work.  She has had no problems with nausea or vomiting.  She is on Coumadin.  Cardiology is managing this.  Her last iron studies that were done back in March showed a ferritin of 69 with an iron saturation of 21%.  She has had no abdominal pain.  There has been no change in bowel or bladder habits.  She has had no diarrhea.  There has been no constipation.  Overall, I would have to say that her performance status is probably ECOG 1.   Medications:  Allergies as of 10/07/2023       Reactions   Augmentin [amoxicillin-pot Clavulanate] Other (See Comments)   Muscle Aches   Prochlorperazine Edisylate Other (See Comments)   Nervous/ flutter/ shakes   Adhesive [tape] Hives   Redness/hives from adhesive tape( tolerates latex gloves); tolerates paper tape        Medication List        Accurate as of October 07, 2023  1:09 PM. If you have any questions, ask your nurse or doctor.          albuterol 108 (90 Base) MCG/ACT inhaler Commonly known as: VENTOLIN HFA TAKE 2 PUFFS BY MOUTH EVERY 6 HOURS AS NEEDED FOR WHEEZE OR SHORTNESS OF  BREATH   amoxicillin 500 MG tablet Commonly known as: AMOXIL Take 2,000 mg by mouth See admin instructions. Take 2,000 mg one hour prior to dental visits.   aspirin EC 81 MG tablet Take 81 mg by mouth daily.   azithromycin 500 MG tablet Commonly known as: ZITHROMAX Take 500 mg by mouth daily.   b complex vitamins capsule Take 1 capsule by mouth 2 (two) times daily.   carvedilol 3.125 MG tablet Commonly known as: COREG Take 1 tablet (3.125 mg total) by mouth 2 (two) times daily with a meal.   cholecalciferol 25 MCG (1000 UNIT) tablet Commonly known as: VITAMIN D3 Take 4,000 Units by mouth daily.   furosemide 20 MG tablet Commonly known as: LASIX Take 1 tablet (20 mg total) by mouth daily.   levothyroxine 100 MCG tablet Commonly known as: SYNTHROID Take 1 tablet (100 mcg total) by mouth daily before breakfast.   lidocaine-prilocaine cream Commonly known as: EMLA Apply 1 Application topically as needed. Apply to port per instructions   loperamide 2 MG tablet Commonly known as: IMODIUM A-D Take 4 mg by mouth 3 (three) times daily as needed for diarrhea or loose stools.   LORazepam 1 MG tablet Commonly known as: ATIVAN TAKE ONE  TABLET BY MOUTH EVERY 8 HOURS AS NEEDED FOR ANXIETY   methocarbamol 500 MG tablet Commonly known as: ROBAXIN TAKE ONE TABLET BY MOUTH EVERY 6 HOURS AS NEEDED FOR MUSCLE SPASMS   Potassium Chloride ER 20 MEQ Tbcr Take 1 tablet (20 mEq total) by mouth daily.   potassium chloride SA 20 MEQ tablet Commonly known as: KLOR-CON M Take 20 mEq by mouth daily.   sodium chloride 0.9 % SOLN 50 mL with pembrolizumab 100 MG/4ML SOLN 200 mg Inject 200 mg into the vein every 21 ( twenty-one) days.   spironolactone 25 MG tablet Commonly known as: ALDACTONE Take 1 tablet (25 mg total) by mouth daily.   traMADol 50 MG tablet Commonly known as: ULTRAM TAKE ONE TABLET BY MOUTH EVERY 6 HOURS AS NEEDED   Vitamin D (Ergocalciferol) 1.25 MG (50000 UNIT)  Caps capsule Commonly known as: DRISDOL TAKE ONE CAPSULE BY MOUTH every 7 DAYS   warfarin 7.5 MG tablet Commonly known as: COUMADIN Take as directed by the anticoagulation clinic. If you are unsure how to take this medication, talk to your nurse or doctor. Original instructions: Take 1/2 tablet to 1 tablet by mouth daily as directed by Anticoagulation Clinic.   zolpidem 10 MG tablet Commonly known as: AMBIEN TAKE ONE TABLET BY MOUTH EVERY DAY        Allergies:  Allergies  Allergen Reactions   Augmentin [Amoxicillin-Pot Clavulanate] Other (See Comments)    Muscle Aches   Prochlorperazine Edisylate Other (See Comments)    Nervous/ flutter/ shakes   Adhesive [Tape] Hives    Redness/hives from adhesive tape( tolerates latex gloves); tolerates paper tape    Past Medical History, Surgical history, Social history, and Family History were reviewed and updated.  Review of Systems: Review of Systems  HENT: Negative.    Eyes: Negative.   Respiratory: Negative.    Cardiovascular: Negative.   Gastrointestinal: Negative.   Genitourinary: Negative.   Musculoskeletal: Negative.   Skin: Negative.   Neurological: Negative.   Endo/Heme/Allergies: Negative.   Psychiatric/Behavioral: Negative.       Physical Exam:  height is 5\' 6"  (1.676 m) and weight is 162 lb (73.5 kg). Her oral temperature is 98.2 F (36.8 C). Her blood pressure is 100/62 and her pulse is 71. Her respiration is 16 and oxygen saturation is 100%.   Wt Readings from Last 3 Encounters:  10/07/23 162 lb (73.5 kg)  09/15/23 163 lb 12.8 oz (74.3 kg)  09/09/23 161 lb 12.8 oz (73.4 kg)    Physical Exam Vitals reviewed.  HENT:     Head: Normocephalic and atraumatic.  Eyes:     Pupils: Pupils are equal, round, and reactive to light.  Cardiovascular:     Rate and Rhythm: Normal rate and regular rhythm.     Heart sounds: Normal heart sounds.  Pulmonary:     Effort: Pulmonary effort is normal.     Breath sounds:  Normal breath sounds.  Abdominal:     General: Bowel sounds are normal.     Palpations: Abdomen is soft.  Musculoskeletal:        General: No tenderness or deformity. Normal range of motion.     Cervical back: Normal range of motion.  Lymphadenopathy:     Cervical: No cervical adenopathy.  Skin:    General: Skin is warm and dry.     Findings: No erythema or rash.  Neurological:     Mental Status: She is alert and oriented to person, place, and time.  Psychiatric:        Behavior: Behavior normal.        Thought Content: Thought content normal.        Judgment: Judgment normal.      Lab Results  Component Value Date   WBC 8.8 10/07/2023   HGB 11.9 (L) 10/07/2023   HCT 35.7 (L) 10/07/2023   MCV 87.7 10/07/2023   PLT 200 10/07/2023   Lab Results  Component Value Date   FERRITIN 69 09/09/2023   IRON 68 09/09/2023   TIBC 328 09/09/2023   UIBC 260 09/09/2023   IRONPCTSAT 21 09/09/2023   Lab Results  Component Value Date   RETICCTPCT 1.3 08/12/2023   RBC 4.07 10/07/2023   No results found for: "KPAFRELGTCHN", "LAMBDASER", "KAPLAMBRATIO" No results found for: "IGGSERUM", "IGA", "IGMSERUM" No results found for: "TOTALPROTELP", "ALBUMINELP", "A1GS", "A2GS", "BETS", "BETA2SER", "GAMS", "MSPIKE", "SPEI"   Chemistry      Component Value Date/Time   NA 139 10/07/2023 1155   NA 139 08/14/2020 1428   NA 140 03/15/2016 0953   K 3.6 10/07/2023 1155   K 3.9 03/15/2016 0953   CL 102 10/07/2023 1155   CL 102 12/13/2015 0803   CO2 27 10/07/2023 1155   CO2 19 (L) 03/15/2016 0953   BUN 11 10/07/2023 1155   BUN 16 08/14/2020 1428   BUN 15.9 03/15/2016 0953   CREATININE 1.06 (H) 10/07/2023 1155   CREATININE 1.4 (H) 03/15/2016 0953      Component Value Date/Time   CALCIUM 9.0 10/07/2023 1155   CALCIUM 9.3 03/15/2016 0953   ALKPHOS 49 10/07/2023 1155   ALKPHOS 55 03/15/2016 0953   AST 13 (L) 10/07/2023 1155   AST 17 03/15/2016 0953   ALT 9 10/07/2023 1155   ALT 23  03/15/2016 0953   BILITOT 0.3 10/07/2023 1155   BILITOT 0.38 03/15/2016 0953       Impression and Plan: Ms. Netzley is a very pleasant 63 yo caucasian female with recurrent granulosa cell tumor on immunotherapy. We are trying to avoid systemic chemotherapy if possible.   We will have to see what her Inhibitor cannot be level looks like.  This will certainly be critical.  Hopefully, we will find that everything is holding steady.  If so, we can probably go without having to do scans on her.  If she does need to do surgery for her hip, I certainly would have no problems with this.  This is all about quality of life.  Right now, we will plan to have her come back to see us  in another month.   Ivor Mars, MD 4/15/20251:09 PM

## 2023-10-07 NOTE — Patient Instructions (Signed)

## 2023-10-07 NOTE — Patient Instructions (Signed)
 [facility] - A DEPT OF [facility]  Discharge Instructions: Thank you for choosing [facility] to provide your oncology and hematology care.   If you have a lab appointment with the Cancer Center, please go directly to the Cancer Center and check in at the registration area.  Wear comfortable clothing and clothing appropriate for easy access to any Portacath or PICC line.   We strive to give you quality time with your provider. You may need to reschedule your appointment if you arrive late (15 or more minutes).  Arriving late affects you and other patients whose appointments are after yours.  Also, if you miss three or more appointments without notifying the office, you may be dismissed from the clinic at the provider's discretion.      For prescription refill requests, have your pharmacy contact our office and allow 72 hours for refills to be completed.    Today you received the following chemotherapy and/or immunotherapy agents Keytruda       To help prevent nausea and vomiting after your treatment, we encourage you to take your nausea medication as directed.  BELOW ARE SYMPTOMS THAT SHOULD BE REPORTED IMMEDIATELY: *FEVER GREATER THAN 100.4 F (38 C) OR HIGHER *CHILLS OR SWEATING *NAUSEA AND VOMITING THAT IS NOT CONTROLLED WITH YOUR NAUSEA MEDICATION *UNUSUAL SHORTNESS OF BREATH *UNUSUAL BRUISING OR BLEEDING *URINARY PROBLEMS (pain or burning when urinating, or frequent urination) *BOWEL PROBLEMS (unusual diarrhea, constipation, pain near the anus) TENDERNESS IN MOUTH AND THROAT WITH OR WITHOUT PRESENCE OF ULCERS (sore throat, sores in mouth, or a toothache) UNUSUAL RASH, SWELLING OR PAIN  UNUSUAL VAGINAL DISCHARGE OR ITCHING   Items with * indicate a potential emergency and should be followed up as soon as possible or go to the Emergency Department if any problems should occur.  Please show the CHEMOTHERAPY ALERT CARD or IMMUNOTHERAPY  ALERT CARD at check-in to the Emergency Department and triage nurse. Should you have questions after your visit or need to cancel or reschedule your appointment, please contact [facility] - A DEPT OF [facility]  [phone] and follow the prompts.  Office hours are 8:00 a.m. to 4:30 p.m. Monday - Friday. Please note that voicemails left after 4:00 p.m. may not be returned until the following business day.  We are closed weekends and major holidays. You have access to a nurse at all times for urgent questions. Please call the main number to the clinic [phone] and follow the prompts.  For any non-urgent questions, you may also contact your provider using MyChart. We now offer e-Visits for anyone 7 and older to request care online for non-urgent symptoms. For details visit [email or web address].   Also download the MyChart app! Go to the app store, search "MyChart", open the app, select [place], and log in with your MyChart username and password.

## 2023-10-08 ENCOUNTER — Ambulatory Visit: Payer: Self-pay | Attending: Cardiology | Admitting: *Deleted

## 2023-10-08 DIAGNOSIS — Z953 Presence of xenogenic heart valve: Secondary | ICD-10-CM | POA: Diagnosis not present

## 2023-10-08 DIAGNOSIS — Z952 Presence of prosthetic heart valve: Secondary | ICD-10-CM

## 2023-10-08 DIAGNOSIS — I480 Paroxysmal atrial fibrillation: Secondary | ICD-10-CM | POA: Diagnosis not present

## 2023-10-08 LAB — POCT INR: INR: 2.5 (ref 2.0–3.0)

## 2023-10-08 NOTE — Patient Instructions (Signed)
 Description   NO PRINTOUT. Continue taking warfarin 1/2 tablet daily except for 1 tablet on Tuesdays, Thursdays, and Saturdays.  Recheck INR in 6 weeks.  Coumadin Clinic [phone]

## 2023-10-09 LAB — INHIBIN B: Inhibin B: 84.5 pg/mL — ABNORMAL HIGH (ref 0.0–16.9)

## 2023-10-10 ENCOUNTER — Encounter: Payer: Self-pay | Admitting: *Deleted

## 2023-10-13 ENCOUNTER — Encounter: Payer: Self-pay | Admitting: Student

## 2023-10-13 ENCOUNTER — Ambulatory Visit: Payer: 59 | Attending: Physician Assistant | Admitting: Student

## 2023-10-13 VITALS — BP 92/62 | HR 75 | Ht 66.0 in | Wt 161.8 lb

## 2023-10-13 DIAGNOSIS — I480 Paroxysmal atrial fibrillation: Secondary | ICD-10-CM | POA: Diagnosis not present

## 2023-10-13 DIAGNOSIS — Z952 Presence of prosthetic heart valve: Secondary | ICD-10-CM | POA: Diagnosis not present

## 2023-10-13 DIAGNOSIS — I255 Ischemic cardiomyopathy: Secondary | ICD-10-CM

## 2023-10-13 DIAGNOSIS — Z95810 Presence of automatic (implantable) cardiac defibrillator: Secondary | ICD-10-CM

## 2023-10-13 DIAGNOSIS — I5022 Chronic systolic (congestive) heart failure: Secondary | ICD-10-CM | POA: Diagnosis not present

## 2023-10-13 LAB — CUP PACEART INCLINIC DEVICE CHECK
Battery Remaining Longevity: 79 mo
Brady Statistic RA Percent Paced: 0 %
Brady Statistic RV Percent Paced: 0.04 %
Date Time Interrogation Session: 20250421125014
HighPow Impedance: 72 Ohm
Implantable Lead Connection Status: 753985
Implantable Lead Connection Status: 753985
Implantable Lead Implant Date: 20110503
Implantable Lead Implant Date: 20130815
Implantable Lead Location: 753859
Implantable Lead Location: 753860
Implantable Pulse Generator Implant Date: 20211012
Lead Channel Impedance Value: 250 Ohm
Lead Channel Impedance Value: 437.5 Ohm
Lead Channel Pacing Threshold Amplitude: 0.5 V
Lead Channel Pacing Threshold Amplitude: 0.5 V
Lead Channel Pacing Threshold Amplitude: 1.75 V
Lead Channel Pacing Threshold Pulse Width: 0.5 ms
Lead Channel Pacing Threshold Pulse Width: 0.5 ms
Lead Channel Pacing Threshold Pulse Width: 1 ms
Lead Channel Sensing Intrinsic Amplitude: 11.9 mV
Lead Channel Sensing Intrinsic Amplitude: 2.1 mV
Lead Channel Setting Pacing Amplitude: 2 V
Lead Channel Setting Pacing Amplitude: 2 V
Lead Channel Setting Pacing Pulse Width: 1 ms
Lead Channel Setting Sensing Sensitivity: 0.5 mV
Pulse Gen Serial Number: 111028151
Zone Setting Status: 755011

## 2023-10-13 NOTE — Progress Notes (Signed)
  Electrophysiology Office Note:   ID:  [name], [name] 09-20-1960, MRN [id number]  Primary Cardiologist: Sonny Dust, MD (Inactive) Electrophysiologist: Manya Sells, MD      History of Present Illness:   [name] is a 63 y.o. female with h/o recurrent ovarian Ca (multiple intra-abdominal metastases treated with prior surgical resection in 2014 and 2015 with further slow progression), hypothyroidism, CKD (III), CAD, s/p inferior MI in 2011 complicated by postinfarction papillary muscle rupture and left ventricular aneurysm who underwent urgent repair with mitral valve replacement using a bioprosthetic tissue valve and repair of the left ventricular aneurysm, CABG),  prosthetic mitral valve stenosis s/p TMVR (08/2017), ICM, VT, ICD, chronic CHF (systolic), AFib seen today for routine electrophysiology followup.   Since last being seen in our clinic the patient reports doing well from a cardiac perspective. Today.  she denies chest pain, palpitations, dyspnea, PND, orthopnea, nausea, vomiting, dizziness, syncope, edema, weight gain, or early satiety.   Review of systems complete and found to be negative unless listed in HPI.   EP Information / Studies Reviewed:    EKG is not ordered today. EKG from 09/15/2022 reviewed which showed NSR  at 73 bpm       ICD Interrogation-  reviewed in detail today,  See PACEART report.  Arrhythmia/Device History SJM dual chamber ICD, implanted RA lead 2011, RV  Lead 2013, gen change 04/03/20 A lead with known low impedance    Physical Exam:   VS:  BP 92/62   Pulse 75   Ht 5\' 6"  (1.676 m)   Wt 161 lb 12.8 oz (73.4 kg)   SpO2 95%   BMI 26.12 kg/m    Wt Readings from Last 3 Encounters:  10/13/23 161 lb 12.8 oz (73.4 kg)  10/07/23 162 lb (73.5 kg)  09/15/23 163 lb 12.8 oz (74.3 kg)     GEN: No acute distress  NECK: No JVD; No carotid bruits CARDIAC: Regular rate and rhythm, no murmurs, rubs, gallops RESPIRATORY:  Clear to  auscultation without rales, wheezing or rhonchi  ABDOMEN: Soft, non-tender, non-distended EXTREMITIES:  No edema; No deformity   ASSESSMENT AND PLAN:    Chronic systolic CHF  s/p Abbott dual chamber ICD  euvolemic today Stable on an appropriate medical regimen Normal ICD function See Pace Art report No changes today RA lead with known noise and low pacing impedance.  H/o VT No further.   Paroxysmal AF Continue coumadin  for CHA2DS2/VASc of at least 3 0% burden  S/p MVR -> stenosis > TMVR No symptoms to suggest clinical change  CAD Denies s/s ischemia    Disposition:   Follow up with EP APP in 12 months   Signed, Tylene Galla, PA-C

## 2023-10-13 NOTE — Patient Instructions (Signed)
 Medication Instructions:  Your physician recommends that you continue on your current medications as directed. Please refer to the Current Medication list given to you today.  *If you need a refill on your cardiac medications before your next appointment, please call your pharmacy*  Lab Work: None ordered If you have labs (blood work) drawn today and your tests are completely normal, you will receive your results only by: MyChart Message (if you have MyChart) OR A paper copy in the mail If you have any lab test that is abnormal or we need to change your treatment, we will call you to review the results.  Follow-Up: At [facility], you and your health needs are our priority.  As part of our continuing mission to provide you with exceptional heart care, our providers are all part of one team.  This team includes your primary Cardiologist (physician) and Advanced Practice Providers or APPs (Physician Assistants and Nurse Practitioners) who all work together to provide you with the care you need, when you need it.  Your next appointment:   1 year(s)  Provider:   You will see one of the following Advanced Practice Providers on your designated Care Team:   Francis Dowse, [place] Casimiro Needle "Mardelle Matte" Tillery, PA-C Canary Brim, NP      1st Floor: - Lobby - Registration  - Pharmacy  - Lab - Cafe  2nd Floor: - PV Lab - Diagnostic Testing (echo, CT, nuclear med)  3rd Floor: - Vacant  4th Floor: - TCTS (cardiothoracic surgery) - AFib Clinic - Structural Heart Clinic - Vascular Surgery  - Vascular Ultrasound  5th Floor: - HeartCare Cardiology (general and EP) - Clinical Pharmacy for coumadin, hypertension, lipid, weight-loss medications, and med management appointments    Valet parking services will be available as well.

## 2023-10-15 ENCOUNTER — Other Ambulatory Visit: Payer: Self-pay | Admitting: Hematology & Oncology

## 2023-10-15 DIAGNOSIS — D3910 Neoplasm of uncertain behavior of unspecified ovary: Secondary | ICD-10-CM

## 2023-10-24 ENCOUNTER — Other Ambulatory Visit: Payer: Self-pay | Admitting: Hematology & Oncology

## 2023-10-24 DIAGNOSIS — D3910 Neoplasm of uncertain behavior of unspecified ovary: Secondary | ICD-10-CM

## 2023-10-28 ENCOUNTER — Inpatient Hospital Stay: Payer: Self-pay

## 2023-10-28 ENCOUNTER — Inpatient Hospital Stay: Payer: Self-pay | Admitting: Hematology & Oncology

## 2023-10-30 ENCOUNTER — Encounter: Payer: Self-pay | Admitting: Hematology & Oncology

## 2023-11-04 ENCOUNTER — Ambulatory Visit: Payer: Self-pay | Admitting: Hematology & Oncology

## 2023-11-04 ENCOUNTER — Inpatient Hospital Stay

## 2023-11-04 ENCOUNTER — Encounter: Payer: Self-pay | Admitting: Family

## 2023-11-04 ENCOUNTER — Inpatient Hospital Stay: Attending: Hematology & Oncology

## 2023-11-04 ENCOUNTER — Inpatient Hospital Stay (HOSPITAL_BASED_OUTPATIENT_CLINIC_OR_DEPARTMENT_OTHER): Admitting: Family

## 2023-11-04 VITALS — BP 96/54 | HR 77 | Temp 98.4°F | Resp 17 | Wt 160.0 lb

## 2023-11-04 VITALS — BP 107/48 | HR 57

## 2023-11-04 DIAGNOSIS — D3910 Neoplasm of uncertain behavior of unspecified ovary: Secondary | ICD-10-CM | POA: Diagnosis not present

## 2023-11-04 DIAGNOSIS — D500 Iron deficiency anemia secondary to blood loss (chronic): Secondary | ICD-10-CM

## 2023-11-04 DIAGNOSIS — R978 Other abnormal tumor markers: Secondary | ICD-10-CM | POA: Insufficient documentation

## 2023-11-04 DIAGNOSIS — C786 Secondary malignant neoplasm of retroperitoneum and peritoneum: Secondary | ICD-10-CM | POA: Diagnosis not present

## 2023-11-04 DIAGNOSIS — K909 Intestinal malabsorption, unspecified: Secondary | ICD-10-CM | POA: Diagnosis not present

## 2023-11-04 DIAGNOSIS — Z5112 Encounter for antineoplastic immunotherapy: Secondary | ICD-10-CM | POA: Insufficient documentation

## 2023-11-04 DIAGNOSIS — E039 Hypothyroidism, unspecified: Secondary | ICD-10-CM | POA: Diagnosis not present

## 2023-11-04 DIAGNOSIS — Z7901 Long term (current) use of anticoagulants: Secondary | ICD-10-CM | POA: Diagnosis not present

## 2023-11-04 DIAGNOSIS — C569 Malignant neoplasm of unspecified ovary: Secondary | ICD-10-CM | POA: Diagnosis present

## 2023-11-04 DIAGNOSIS — Z953 Presence of xenogenic heart valve: Secondary | ICD-10-CM

## 2023-11-04 DIAGNOSIS — Z952 Presence of prosthetic heart valve: Secondary | ICD-10-CM | POA: Diagnosis not present

## 2023-11-04 DIAGNOSIS — N1831 Chronic kidney disease, stage 3a: Secondary | ICD-10-CM

## 2023-11-04 DIAGNOSIS — D508 Other iron deficiency anemias: Secondary | ICD-10-CM | POA: Diagnosis not present

## 2023-11-04 LAB — TSH: TSH: 0.107 u[IU]/mL — ABNORMAL LOW (ref 0.350–4.500)

## 2023-11-04 LAB — CBC WITH DIFFERENTIAL (CANCER CENTER ONLY)
Abs Immature Granulocytes: 0.03 10*3/uL (ref 0.00–0.07)
Basophils Absolute: 0.1 10*3/uL (ref 0.0–0.1)
Basophils Relative: 1 %
Eosinophils Absolute: 0.1 10*3/uL (ref 0.0–0.5)
Eosinophils Relative: 1 %
HCT: 34.1 % — ABNORMAL LOW (ref 36.0–46.0)
Hemoglobin: 11.4 g/dL — ABNORMAL LOW (ref 12.0–15.0)
Immature Granulocytes: 0 %
Lymphocytes Relative: 6 %
Lymphs Abs: 0.5 10*3/uL — ABNORMAL LOW (ref 0.7–4.0)
MCH: 29.3 pg (ref 26.0–34.0)
MCHC: 33.4 g/dL (ref 30.0–36.0)
MCV: 87.7 fL (ref 80.0–100.0)
Monocytes Absolute: 0.8 10*3/uL (ref 0.1–1.0)
Monocytes Relative: 9 %
Neutro Abs: 6.8 10*3/uL (ref 1.7–7.7)
Neutrophils Relative %: 83 %
Platelet Count: 207 10*3/uL (ref 150–400)
RBC: 3.89 MIL/uL (ref 3.87–5.11)
RDW: 15.1 % (ref 11.5–15.5)
WBC Count: 8.2 10*3/uL (ref 4.0–10.5)
nRBC: 0 % (ref 0.0–0.2)

## 2023-11-04 LAB — CMP (CANCER CENTER ONLY)
ALT: 8 U/L (ref 0–44)
AST: 14 U/L — ABNORMAL LOW (ref 15–41)
Albumin: 4.2 g/dL (ref 3.5–5.0)
Alkaline Phosphatase: 47 U/L (ref 38–126)
Anion gap: 9 (ref 5–15)
BUN: 15 mg/dL (ref 8–23)
CO2: 28 mmol/L (ref 22–32)
Calcium: 8.9 mg/dL (ref 8.9–10.3)
Chloride: 101 mmol/L (ref 98–111)
Creatinine: 1.09 mg/dL — ABNORMAL HIGH (ref 0.44–1.00)
GFR, Estimated: 57 mL/min — ABNORMAL LOW (ref >60)
Glucose, Bld: 91 mg/dL (ref 70–99)
Potassium: 3.5 mmol/L (ref 3.5–5.1)
Sodium: 138 mmol/L (ref 135–145)
Total Bilirubin: 0.3 mg/dL (ref 0.0–1.2)
Total Protein: 6.5 g/dL (ref 6.5–8.1)

## 2023-11-04 LAB — RETICULOCYTES
Immature Retic Fract: 8.3 % (ref 2.3–15.9)
RBC.: 3.88 MIL/uL (ref 3.87–5.11)
Retic Count, Absolute: 47.3 10*3/uL (ref 19.0–186.0)
Retic Ct Pct: 1.2 % (ref 0.4–3.1)

## 2023-11-04 LAB — IRON AND IRON BINDING CAPACITY (CC-WL,HP ONLY)
Iron: 45 ug/dL (ref 28–170)
Saturation Ratios: 12 % (ref 10.4–31.8)
TIBC: 365 ug/dL (ref 250–450)
UIBC: 320 ug/dL (ref 148–442)

## 2023-11-04 LAB — FERRITIN: Ferritin: 30 ng/mL (ref 11–307)

## 2023-11-04 MED ORDER — SODIUM CHLORIDE 0.9 % IV SOLN: Freq: Once | INTRAVENOUS | Status: AC

## 2023-11-04 MED ORDER — SODIUM CHLORIDE 0.9% FLUSH
10.0000 mL | INTRAVENOUS | Status: DC | PRN
  Administered 2023-11-04: 10 mL

## 2023-11-04 MED ORDER — SODIUM CHLORIDE 0.9 % IV SOLN
200.0000 mg | Freq: Once | INTRAVENOUS | Status: AC
  Administered 2023-11-04: 200 mg via INTRAVENOUS
  Filled 2023-11-04: qty 8

## 2023-11-04 MED ORDER — HEPARIN SOD (PORK) LOCK FLUSH 100 UNIT/ML IV SOLN
500.0000 [IU] | Freq: Once | INTRAVENOUS | Status: AC | PRN
Start: 2023-11-04 — End: 2023-11-04
  Administered 2023-11-04: 500 [IU]

## 2023-11-04 NOTE — Progress Notes (Signed)
 Hematology and Oncology Follow Up Visit  [name] [id number] 1961-01-13 63 y.o. 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 She is feeling much better since receiving IV iron.   She is stressed with her work situation and is possibly going to be let go with down sizing. She will let us  know if she needs FMLA forms filled out to help protect her job.  Inhibin B at last visit was 84 (previously 63). Today's result is pending.  She has had occasional nausea and diarrhea but states that this has been tolerable so far.  No fever, chills, cough, rash, dizziness, SOB, chest pain, abdominal pain or changes in bladder habits.  No swelling or tenderness in her extremities.  No falls or syncope reported.  Appetite and hydration are good. Weight is stable at 160 lbs.   ECOG Performance Status: 1 - Symptomatic but completely ambulatory  Medications:  Allergies as of 11/04/2023       Reactions   Augmentin [amoxicillin -pot Clavulanate] Other (See Comments)   Muscle Aches   Prochlorperazine Edisylate Other (See Comments)   Nervous/ flutter/ shakes   Adhesive [tape] Hives   Redness/hives from adhesive tape( tolerates latex gloves); tolerates paper tape        Medication List        Accurate as of Nov 04, 2023 10:59 AM. If you have any questions, ask your nurse or doctor.          albuterol  108 (90 Base) MCG/ACT inhaler Commonly known as: VENTOLIN  HFA TAKE 2 PUFFS BY MOUTH EVERY 6 HOURS AS NEEDED FOR WHEEZE OR SHORTNESS OF BREATH   amoxicillin  500 MG tablet Commonly known as: AMOXIL  Take 2,000 mg by mouth  See admin instructions. Take 2,000 mg one hour prior to dental visits.   aspirin  EC 81 MG tablet Take 81 mg by mouth daily.   azithromycin  500 MG tablet Commonly known as: ZITHROMAX  Take 500 mg by mouth daily.   b complex vitamins capsule Take 1 capsule by mouth 2 (two) times daily.   carvedilol  3.125 MG tablet Commonly known as: COREG  Take 1 tablet (3.125 mg total) by mouth 2 (two) times daily with a meal.   cholecalciferol  25 MCG (1000 UNIT) tablet Commonly known as: VITAMIN D3 Take 4,000 Units by mouth daily.   furosemide  20 MG tablet Commonly known as: LASIX  Take 1 tablet (20 mg total) by mouth daily.   levothyroxine  100 MCG tablet Commonly known as: SYNTHROID  Take 1 tablet (100 mcg total) by mouth daily before breakfast.   lidocaine -prilocaine  cream Commonly known as: EMLA  Apply 1 Application topically as needed. Apply to port per instructions   loperamide  2 MG tablet Commonly known as: IMODIUM  A-D Take 4 mg by mouth 3 (three) times daily as needed for diarrhea or loose stools.   LORazepam  1 MG tablet Commonly known as: ATIVAN  TAKE ONE TABLET BY MOUTH EVERY 8 HOURS AS NEEDED FOR ANXIETY   methocarbamol  500 MG tablet Commonly known as: ROBAXIN  TAKE ONE TABLET BY MOUTH EVERY 6 HOURS AS NEEDED FOR MUSCLE SPASMS   Potassium Chloride   ER 20 MEQ Tbcr Take 1 tablet (20 mEq total) by mouth daily.   potassium chloride  SA 20 MEQ tablet Commonly known as: KLOR-CON  M Take 20 mEq by mouth daily.   sodium chloride  0.9 % SOLN 50 mL with pembrolizumab  100 MG/4ML SOLN 200 mg Inject 200 mg into the vein every 21 ( twenty-one) days.   spironolactone  25 MG tablet Commonly known as: ALDACTONE  Take 1 tablet (25 mg total) by mouth daily.   traMADol  50 MG tablet Commonly known as: ULTRAM  TAKE ONE TABLET BY MOUTH EVERY 6 HOURS AS NEEDED   Vitamin D  (Ergocalciferol ) 1.25 MG (50000 UNIT) Caps capsule Commonly known as: DRISDOL  TAKE ONE CAPSULE BY MOUTH every 7 DAYS    warfarin 7.5 MG tablet Commonly known as: COUMADIN  Take as directed by the anticoagulation clinic. If you are unsure how to take this medication, talk to your nurse or doctor. Original instructions: Take 1/2 tablet to 1 tablet by mouth daily as directed by Anticoagulation Clinic.   zolpidem  10 MG tablet Commonly known as: AMBIEN  TAKE ONE TABLET BY MOUTH EVERY DAY        Allergies:  Allergies  Allergen Reactions   Augmentin [Amoxicillin -Pot Clavulanate] Other (See Comments)    Muscle Aches   Prochlorperazine Edisylate Other (See Comments)    Nervous/ flutter/ shakes   Adhesive [Tape] Hives    Redness/hives from adhesive tape( tolerates latex gloves); tolerates paper tape    Past Medical History, Surgical history, Social history, and Family History were reviewed and updated.  Review of Systems: All other 10 point review of systems is negative.   Physical Exam:  weight is 160 lb (72.6 kg). Her oral temperature is 98.4 F (36.9 C). Her blood pressure is 96/54 (abnormal) and her pulse is 77. Her respiration is 17 and oxygen saturation is 99%.   Wt Readings from Last 3 Encounters:  11/04/23 160 lb (72.6 kg)  10/13/23 161 lb 12.8 oz (73.4 kg)  10/07/23 162 lb (73.5 kg)    Ocular: Sclerae unicteric, pupils equal, round and reactive to light Ear-nose-throat: Oropharynx clear, dentition fair Lymphatic: No cervical or supraclavicular adenopathy Lungs no rales or rhonchi, good excursion bilaterally Heart regular rate and rhythm, no murmur appreciated Abd soft, nontender, positive bowel sounds MSK no focal spinal tenderness, no joint edema Neuro: non-focal, well-oriented, appropriate affect Breasts: Deferred   Lab Results  Component Value Date   WBC 8.2 11/04/2023   HGB 11.4 (L) 11/04/2023   HCT 34.1 (L) 11/04/2023   MCV 87.7 11/04/2023   PLT 207 11/04/2023   Lab Results  Component Value Date   FERRITIN 69 09/09/2023   IRON 68 09/09/2023   TIBC 328 09/09/2023   UIBC  260 09/09/2023   IRONPCTSAT 21 09/09/2023   Lab Results  Component Value Date   RETICCTPCT 1.2 11/04/2023   RBC 3.89 11/04/2023   RBC 3.88 11/04/2023   No results found for: "KPAFRELGTCHN", "LAMBDASER", "KAPLAMBRATIO" No results found for: "IGGSERUM", "IGA", "IGMSERUM" No results found for: "TOTALPROTELP", "ALBUMINELP", "A1GS", "A2GS", "BETS", "BETA2SER", "GAMS", "MSPIKE", "SPEI"   Chemistry      Component Value Date/Time   NA 138 11/04/2023 0943   NA 139 08/14/2020 1428   NA 140 03/15/2016 0953   K 3.5 11/04/2023 0943   K 3.9 03/15/2016 0953   CL 101 11/04/2023 0943   CL 102 12/13/2015 0803   CO2 28 11/04/2023 0943   CO2 19 (L) 03/15/2016 0953   BUN 15 11/04/2023 0943   BUN 16 08/14/2020  1428   BUN 15.9 03/15/2016 0953   CREATININE 1.09 (H) 11/04/2023 0943   CREATININE 1.4 (H) 03/15/2016 0953      Component Value Date/Time   CALCIUM  8.9 11/04/2023 0943   CALCIUM  9.3 03/15/2016 0953   ALKPHOS 47 11/04/2023 0943   ALKPHOS 55 03/15/2016 0953   AST 14 (L) 11/04/2023 0943   AST 17 03/15/2016 0953   ALT 8 11/04/2023 0943   ALT 23 03/15/2016 0953   BILITOT 0.3 11/04/2023 0943   BILITOT 0.38 03/15/2016 0953       Impression and Plan: [name] is a very pleasant 63 yo caucasian female with recurrent granulosa cell tumor on immunotherapy. We are trying to avoid systemic chemotherapy if possible.  Inhibin B is pending along with thyroid  and iron studies.  We will proceed with treatment today as planned.  Follow-up in another 3-4 weeks.   Kennard Pea, NP 5/13/202510:59 AM

## 2023-11-04 NOTE — Patient Instructions (Signed)
 [facility] - A DEPT OF [facility]  Discharge Instructions: Thank you for choosing [facility] to provide your oncology and hematology care.   If you have a lab appointment with the Cancer Center, please go directly to the Cancer Center and check in at the registration area.  Wear comfortable clothing and clothing appropriate for easy access to any Portacath or PICC line.   We strive to give you quality time with your provider. You may need to reschedule your appointment if you arrive late (15 or more minutes).  Arriving late affects you and other patients whose appointments are after yours.  Also, if you miss three or more appointments without notifying the office, you may be dismissed from the clinic at the provider's discretion.      For prescription refill requests, have your pharmacy contact our office and allow 72 hours for refills to be completed.    Today you received the following chemotherapy and/or immunotherapy agents Keytruda       To help prevent nausea and vomiting after your treatment, we encourage you to take your nausea medication as directed.  BELOW ARE SYMPTOMS THAT SHOULD BE REPORTED IMMEDIATELY: *FEVER GREATER THAN 100.4 F (38 C) OR HIGHER *CHILLS OR SWEATING *NAUSEA AND VOMITING THAT IS NOT CONTROLLED WITH YOUR NAUSEA MEDICATION *UNUSUAL SHORTNESS OF BREATH *UNUSUAL BRUISING OR BLEEDING *URINARY PROBLEMS (pain or burning when urinating, or frequent urination) *BOWEL PROBLEMS (unusual diarrhea, constipation, pain near the anus) TENDERNESS IN MOUTH AND THROAT WITH OR WITHOUT PRESENCE OF ULCERS (sore throat, sores in mouth, or a toothache) UNUSUAL RASH, SWELLING OR PAIN  UNUSUAL VAGINAL DISCHARGE OR ITCHING   Items with * indicate a potential emergency and should be followed up as soon as possible or go to the Emergency Department if any problems should occur.  Please show the CHEMOTHERAPY ALERT CARD or IMMUNOTHERAPY  ALERT CARD at check-in to the Emergency Department and triage nurse. Should you have questions after your visit or need to cancel or reschedule your appointment, please contact [facility] - A DEPT OF [facility]  [phone] and follow the prompts.  Office hours are 8:00 a.m. to 4:30 p.m. Monday - Friday. Please note that voicemails left after 4:00 p.m. may not be returned until the following business day.  We are closed weekends and major holidays. You have access to a nurse at all times for urgent questions. Please call the main number to the clinic [phone] and follow the prompts.  For any non-urgent questions, you may also contact your provider using MyChart. We now offer e-Visits for anyone 7 and older to request care online for non-urgent symptoms. For details visit [email or web address].   Also download the MyChart app! Go to the app store, search "MyChart", open the app, select [place], and log in with your MyChart username and password.

## 2023-11-04 NOTE — Patient Instructions (Signed)

## 2023-11-06 LAB — INHIBIN B: Inhibin B: 96.3 pg/mL — ABNORMAL HIGH (ref 0.0–16.9)

## 2023-11-06 LAB — T4: T4, Total: 9.8 ug/dL (ref 4.5–12.0)

## 2023-11-07 ENCOUNTER — Encounter: Payer: Self-pay | Admitting: *Deleted

## 2023-11-10 ENCOUNTER — Other Ambulatory Visit: Payer: Self-pay | Admitting: Hematology & Oncology

## 2023-11-10 DIAGNOSIS — D3910 Neoplasm of uncertain behavior of unspecified ovary: Secondary | ICD-10-CM

## 2023-11-18 NOTE — Addendum Note (Signed)
 Addended by: Lott Rouleau A on: 11/18/2023 10:30 AM   Modules accepted: Orders

## 2023-11-18 NOTE — Progress Notes (Signed)
 Remote ICD transmission.

## 2023-11-19 ENCOUNTER — Ambulatory Visit

## 2023-11-21 ENCOUNTER — Encounter: Payer: Self-pay | Admitting: Hematology & Oncology

## 2023-11-21 ENCOUNTER — Inpatient Hospital Stay

## 2023-11-21 VITALS — BP 109/59 | HR 66 | Temp 97.8°F | Resp 18

## 2023-11-21 DIAGNOSIS — D6481 Anemia due to antineoplastic chemotherapy: Secondary | ICD-10-CM

## 2023-11-21 DIAGNOSIS — Z5112 Encounter for antineoplastic immunotherapy: Secondary | ICD-10-CM | POA: Diagnosis not present

## 2023-11-21 MED ORDER — SODIUM CHLORIDE 0.9 % IV SOLN
125.0000 mg | Freq: Once | INTRAVENOUS | Status: AC
  Administered 2023-11-21: 125 mg via INTRAVENOUS
  Filled 2023-11-21: qty 10

## 2023-11-21 MED ORDER — SODIUM CHLORIDE 0.9% FLUSH
10.0000 mL | Freq: Once | INTRAVENOUS | Status: AC
  Administered 2023-11-21: 10 mL via INTRAVENOUS

## 2023-11-21 MED ORDER — HEPARIN SOD (PORK) LOCK FLUSH 100 UNIT/ML IV SOLN
500.0000 [IU] | Freq: Once | INTRAVENOUS | Status: AC
  Administered 2023-11-21: 500 [IU] via INTRAVENOUS

## 2023-11-21 MED ORDER — SODIUM CHLORIDE 0.9 % IV SOLN: INTRAVENOUS | Status: DC

## 2023-11-24 ENCOUNTER — Other Ambulatory Visit: Payer: Self-pay | Admitting: Hematology & Oncology

## 2023-11-24 DIAGNOSIS — D3910 Neoplasm of uncertain behavior of unspecified ovary: Secondary | ICD-10-CM

## 2023-11-25 ENCOUNTER — Encounter: Payer: Self-pay | Admitting: Hematology & Oncology

## 2023-11-25 ENCOUNTER — Inpatient Hospital Stay: Attending: Hematology & Oncology

## 2023-11-25 ENCOUNTER — Inpatient Hospital Stay

## 2023-11-25 ENCOUNTER — Inpatient Hospital Stay (HOSPITAL_BASED_OUTPATIENT_CLINIC_OR_DEPARTMENT_OTHER): Admitting: Hematology & Oncology

## 2023-11-25 VITALS — BP 112/56 | HR 84 | Temp 98.0°F | Resp 18 | Ht 66.0 in | Wt 165.2 lb

## 2023-11-25 VITALS — BP 100/69 | HR 68 | Resp 20

## 2023-11-25 DIAGNOSIS — Z79620 Long term (current) use of immunosuppressive biologic: Secondary | ICD-10-CM | POA: Diagnosis not present

## 2023-11-25 DIAGNOSIS — D3910 Neoplasm of uncertain behavior of unspecified ovary: Secondary | ICD-10-CM | POA: Diagnosis not present

## 2023-11-25 DIAGNOSIS — C569 Malignant neoplasm of unspecified ovary: Secondary | ICD-10-CM | POA: Insufficient documentation

## 2023-11-25 DIAGNOSIS — Z5112 Encounter for antineoplastic immunotherapy: Secondary | ICD-10-CM | POA: Diagnosis present

## 2023-11-25 DIAGNOSIS — Z953 Presence of xenogenic heart valve: Secondary | ICD-10-CM

## 2023-11-25 DIAGNOSIS — Z7901 Long term (current) use of anticoagulants: Secondary | ICD-10-CM | POA: Insufficient documentation

## 2023-11-25 DIAGNOSIS — Z952 Presence of prosthetic heart valve: Secondary | ICD-10-CM | POA: Diagnosis not present

## 2023-11-25 DIAGNOSIS — D500 Iron deficiency anemia secondary to blood loss (chronic): Secondary | ICD-10-CM

## 2023-11-25 DIAGNOSIS — D508 Other iron deficiency anemias: Secondary | ICD-10-CM | POA: Insufficient documentation

## 2023-11-25 DIAGNOSIS — R5383 Other fatigue: Secondary | ICD-10-CM | POA: Diagnosis not present

## 2023-11-25 DIAGNOSIS — K909 Intestinal malabsorption, unspecified: Secondary | ICD-10-CM | POA: Insufficient documentation

## 2023-11-25 DIAGNOSIS — E039 Hypothyroidism, unspecified: Secondary | ICD-10-CM

## 2023-11-25 LAB — CBC WITH DIFFERENTIAL (CANCER CENTER ONLY)
Abs Immature Granulocytes: 0.03 10*3/uL (ref 0.00–0.07)
Basophils Absolute: 0 10*3/uL (ref 0.0–0.1)
Basophils Relative: 0 %
Eosinophils Absolute: 0.1 10*3/uL (ref 0.0–0.5)
Eosinophils Relative: 2 %
HCT: 33.1 % — ABNORMAL LOW (ref 36.0–46.0)
Hemoglobin: 10.9 g/dL — ABNORMAL LOW (ref 12.0–15.0)
Immature Granulocytes: 0 %
Lymphocytes Relative: 8 %
Lymphs Abs: 0.6 10*3/uL — ABNORMAL LOW (ref 0.7–4.0)
MCH: 29.1 pg (ref 26.0–34.0)
MCHC: 32.9 g/dL (ref 30.0–36.0)
MCV: 88.5 fL (ref 80.0–100.0)
Monocytes Absolute: 0.8 10*3/uL (ref 0.1–1.0)
Monocytes Relative: 9 %
Neutro Abs: 6.8 10*3/uL (ref 1.7–7.7)
Neutrophils Relative %: 81 %
Platelet Count: 199 10*3/uL (ref 150–400)
RBC: 3.74 MIL/uL — ABNORMAL LOW (ref 3.87–5.11)
RDW: 14.8 % (ref 11.5–15.5)
WBC Count: 8.4 10*3/uL (ref 4.0–10.5)
nRBC: 0 % (ref 0.0–0.2)

## 2023-11-25 LAB — IRON AND IRON BINDING CAPACITY (CC-WL,HP ONLY)
Iron: 63 ug/dL (ref 28–170)
Saturation Ratios: 18 % (ref 10.4–31.8)
TIBC: 347 ug/dL (ref 250–450)
UIBC: 284 ug/dL (ref 148–442)

## 2023-11-25 LAB — CMP (CANCER CENTER ONLY)
ALT: 8 U/L (ref 0–44)
AST: 14 U/L — ABNORMAL LOW (ref 15–41)
Albumin: 3.9 g/dL (ref 3.5–5.0)
Alkaline Phosphatase: 51 U/L (ref 38–126)
Anion gap: 8 (ref 5–15)
BUN: 13 mg/dL (ref 8–23)
CO2: 27 mmol/L (ref 22–32)
Calcium: 8.8 mg/dL — ABNORMAL LOW (ref 8.9–10.3)
Chloride: 104 mmol/L (ref 98–111)
Creatinine: 0.99 mg/dL (ref 0.44–1.00)
GFR, Estimated: >60 mL/min (ref >60)
Glucose, Bld: 71 mg/dL (ref 70–99)
Potassium: 3.6 mmol/L (ref 3.5–5.1)
Sodium: 139 mmol/L (ref 135–145)
Total Bilirubin: 0.3 mg/dL (ref 0.0–1.2)
Total Protein: 6.3 g/dL — ABNORMAL LOW (ref 6.5–8.1)

## 2023-11-25 LAB — RETICULOCYTES
Immature Retic Fract: 14.2 % (ref 2.3–15.9)
RBC.: 3.7 MIL/uL — ABNORMAL LOW (ref 3.87–5.11)
Retic Count, Absolute: 55.1 10*3/uL (ref 19.0–186.0)
Retic Ct Pct: 1.5 % (ref 0.4–3.1)

## 2023-11-25 LAB — FERRITIN: Ferritin: 103 ng/mL (ref 11–307)

## 2023-11-25 LAB — TSH: TSH: 0.202 u[IU]/mL — ABNORMAL LOW (ref 0.350–4.500)

## 2023-11-25 MED ORDER — SODIUM CHLORIDE 0.9 % IV SOLN
200.0000 mg | Freq: Once | INTRAVENOUS | Status: AC
  Administered 2023-11-25: 200 mg via INTRAVENOUS
  Filled 2023-11-25: qty 8

## 2023-11-25 MED ORDER — SODIUM CHLORIDE 0.9% FLUSH
10.0000 mL | INTRAVENOUS | Status: DC | PRN
  Administered 2023-11-25: 10 mL

## 2023-11-25 MED ORDER — HEPARIN SOD (PORK) LOCK FLUSH 100 UNIT/ML IV SOLN
500.0000 [IU] | Freq: Once | INTRAVENOUS | Status: AC | PRN
  Administered 2023-11-25: 500 [IU]

## 2023-11-25 MED ORDER — SODIUM CHLORIDE 0.9 % IV SOLN: Freq: Once | INTRAVENOUS | Status: AC

## 2023-11-25 NOTE — Progress Notes (Signed)
 Hematology and Oncology Follow Up Visit  [name] [id number] [birth year] 63 y.o. 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 is here today for follow-up and treatment.  She is doing pretty well.  She does feel tired.  Her iron has been on the low side.  Her last iron saturation was 12%.  She does get IV iron.  She is on Coumadin  which probably causes some of the low iron.  My concern is that the Inhibin B is going up.  She feels that this probably is from not having the pembrolizumab  for a little bit.  We will have to see what the Inhibin B level is..    She is quite busy at work.  She is trying to manage everything at work.  I know that she deals with a lot of her records.  She has had no bleeding.  Has been no change in bowel or bladder habits.  She has had no leg swelling.  She has had no cough or shortness of breath.  Overall, I would have said that her performance status is probably ECOG 1.  Medications:  Allergies as of 11/25/2023       Reactions   Augmentin [amoxicillin -pot Clavulanate] Other (See Comments)   Muscle Aches   Prochlorperazine Edisylate Other (See Comments)   Nervous/ flutter/ shakes   Adhesive [tape] Hives   Redness/hives from adhesive tape( tolerates latex gloves); tolerates paper tape        Medication List        Accurate as of November 25, 2023 10:13 AM. If you have any questions, ask your nurse or doctor.          albuterol  108 (90 Base) MCG/ACT inhaler Commonly known as: VENTOLIN  HFA TAKE 2 PUFFS BY MOUTH EVERY 6 HOURS AS NEEDED FOR WHEEZE OR SHORTNESS OF BREATH   amoxicillin  500 MG tablet Commonly known as:  AMOXIL  Take 2,000 mg by mouth See admin instructions. Take 2,000 mg one hour prior to dental visits.   aspirin  EC 81 MG tablet Take 81 mg by mouth daily.   azithromycin  500 MG tablet Commonly known as: ZITHROMAX  Take 500 mg by mouth daily.   b complex vitamins capsule Take 1 capsule by mouth 2 (two) times daily.   carvedilol  3.125 MG tablet Commonly known as: COREG  Take 1 tablet (3.125 mg total) by mouth 2 (two) times daily with a meal.   cholecalciferol  25 MCG (1000 UNIT) tablet Commonly known as: VITAMIN D3 Take 4,000 Units by mouth daily.   furosemide  20 MG tablet Commonly known as: LASIX  Take 1 tablet (20 mg total) by mouth daily.   levothyroxine  100 MCG tablet Commonly known as: SYNTHROID  Take 1 tablet (100 mcg total) by mouth daily before breakfast.   lidocaine -prilocaine  cream Commonly known as: EMLA  Apply 1 Application topically as needed. Apply to port per instructions   loperamide  2 MG tablet Commonly known as: IMODIUM  A-D Take 4 mg by mouth 3 (three) times daily as needed for diarrhea or loose stools.   LORazepam  1 MG tablet Commonly known as: ATIVAN  TAKE ONE TABLET BY MOUTH EVERY 8 HOURS  AS NEEDED FOR ANXIETY   methocarbamol  500 MG tablet Commonly known as: ROBAXIN  TAKE ONE TABLET BY MOUTH EVERY 6 HOURS AS NEEDED FOR MUSCLE SPASMS   Potassium Chloride  ER 20 MEQ Tbcr Take 1 tablet (20 mEq total) by mouth daily.   potassium chloride  SA 20 MEQ tablet Commonly known as: KLOR-CON  M Take 20 mEq by mouth daily.   sodium chloride  0.9 % SOLN 50 mL with pembrolizumab  100 MG/4ML SOLN 200 mg Inject 200 mg into the vein every 21 ( twenty-one) days.   spironolactone  25 MG tablet Commonly known as: ALDACTONE  Take 1 tablet (25 mg total) by mouth daily.   traMADol  50 MG tablet Commonly known as: ULTRAM  TAKE ONE TABLET BY MOUTH EVERY 6 HOURS AS NEEDED   Vitamin D  (Ergocalciferol ) 1.25 MG (50000 UNIT) Caps capsule Commonly known as: DRISDOL  TAKE ONE CAPSULE  BY MOUTH every 7 DAYS   warfarin 7.5 MG tablet Commonly known as: COUMADIN  Take as directed by the anticoagulation clinic. If you are unsure how to take this medication, talk to your nurse or doctor. Original instructions: Take 1/2 tablet to 1 tablet by mouth daily as directed by Anticoagulation Clinic.   zolpidem  10 MG tablet Commonly known as: AMBIEN  TAKE ONE TABLET BY MOUTH EVERY DAY        Allergies:  Allergies  Allergen Reactions   Augmentin [Amoxicillin -Pot Clavulanate] Other (See Comments)    Muscle Aches   Prochlorperazine Edisylate Other (See Comments)    Nervous/ flutter/ shakes   Adhesive [Tape] Hives    Redness/hives from adhesive tape( tolerates latex gloves); tolerates paper tape    Past Medical History, Surgical history, Social history, and Family History were reviewed and updated.  Review of Systems: Review of Systems  Constitutional: Negative.   HENT: Negative.    Eyes: Negative.   Respiratory: Negative.    Cardiovascular: Negative.   Gastrointestinal: Negative.   Genitourinary: Negative.   Musculoskeletal: Negative.   Skin: Negative.   Neurological: Negative.   Endo/Heme/Allergies: Negative.   Psychiatric/Behavioral: Negative.       Physical Exam:  height is 5\' 6"  (1.676 m) and weight is 165 lb 4 oz (75 kg). Her oral temperature is 98 F (36.7 C). Her blood pressure is 112/56 (abnormal) and her pulse is 84. Her respiration is 18 and oxygen saturation is 98%.   Wt Readings from Last 3 Encounters:  11/25/23 165 lb 4 oz (75 kg)  11/04/23 160 lb (72.6 kg)  10/13/23 161 lb 12.8 oz (73.4 kg)    Physical Exam Vitals reviewed.  HENT:     Head: Normocephalic and atraumatic.  Eyes:     Pupils: Pupils are equal, round, and reactive to light.  Cardiovascular:     Rate and Rhythm: Normal rate and regular rhythm.     Heart sounds: Normal heart sounds.  Pulmonary:     Effort: Pulmonary effort is normal.     Breath sounds: Normal breath sounds.   Abdominal:     General: Bowel sounds are normal.     Palpations: Abdomen is soft.  Musculoskeletal:        General: No tenderness or deformity. Normal range of motion.     Cervical back: Normal range of motion.  Lymphadenopathy:     Cervical: No cervical adenopathy.  Skin:    General: Skin is warm and dry.     Findings: No erythema or rash.  Neurological:     Mental Status: She is alert and oriented to person, place, and  time.  Psychiatric:        Behavior: Behavior normal.        Thought Content: Thought content normal.        Judgment: Judgment normal.     Lab Results  Component Value Date   WBC 8.4 11/25/2023   HGB 10.9 (L) 11/25/2023   HCT 33.1 (L) 11/25/2023   MCV 88.5 11/25/2023   PLT 199 11/25/2023   Lab Results  Component Value Date   FERRITIN 30 11/04/2023   IRON 45 11/04/2023   TIBC 365 11/04/2023   UIBC 320 11/04/2023   IRONPCTSAT 12 11/04/2023   Lab Results  Component Value Date   RETICCTPCT 1.5 11/25/2023   RBC 3.74 (L) 11/25/2023   RBC 3.70 (L) 11/25/2023   No results found for: "KPAFRELGTCHN", "LAMBDASER", "KAPLAMBRATIO" No results found for: "IGGSERUM", "IGA", "IGMSERUM" No results found for: "TOTALPROTELP", "ALBUMINELP", "A1GS", "A2GS", "BETS", "BETA2SER", "GAMS", "MSPIKE", "SPEI"   Chemistry      Component Value Date/Time   NA 139 11/25/2023 0836   NA 139 08/14/2020 1428   NA 140 03/15/2016 0953   K 3.6 11/25/2023 0836   K 3.9 03/15/2016 0953   CL 104 11/25/2023 0836   CL 102 12/13/2015 0803   CO2 27 11/25/2023 0836   CO2 19 (L) 03/15/2016 0953   BUN 13 11/25/2023 0836   BUN 16 08/14/2020 1428   BUN 15.9 03/15/2016 0953   CREATININE 0.99 11/25/2023 0836   CREATININE 1.4 (H) 03/15/2016 0953      Component Value Date/Time   CALCIUM  8.8 (L) 11/25/2023 0836   CALCIUM  9.3 03/15/2016 0953   ALKPHOS 51 11/25/2023 0836   ALKPHOS 55 03/15/2016 0953   AST 14 (L) 11/25/2023 0836   AST 17 03/15/2016 0953   ALT 8 11/25/2023 0836   ALT 23  03/15/2016 0953   BILITOT 0.3 11/25/2023 0836   BILITOT 0.38 03/15/2016 0953       Impression and Plan: Ms. Gough is a very pleasant 63 yo caucasian female with recurrent granulosa cell tumor on immunotherapy.   We are trying to avoid systemic chemotherapy if possible.  We will have to see what the Inhibin B is.  I know she comes in for iron infusions.  I am sure that her iron will be okay.  We will plan for another follow-up in about 3 weeks.   Ivor Mars, MD 6/3/202510:13 AM

## 2023-11-25 NOTE — Patient Instructions (Signed)
 [facility] - A DEPT OF [facility]  Discharge Instructions: Thank you for choosing [facility] to provide your oncology and hematology care.   If you have a lab appointment with the Cancer Center, please go directly to the Cancer Center and check in at the registration area.  Wear comfortable clothing and clothing appropriate for easy access to any Portacath or PICC line.   We strive to give you quality time with your provider. You may need to reschedule your appointment if you arrive late (15 or more minutes).  Arriving late affects you and other patients whose appointments are after yours.  Also, if you miss three or more appointments without notifying the office, you may be dismissed from the clinic at the provider's discretion.      For prescription refill requests, have your pharmacy contact our office and allow 72 hours for refills to be completed.    Today you received the following chemotherapy and/or immunotherapy agents Keytruda       To help prevent nausea and vomiting after your treatment, we encourage you to take your nausea medication as directed.  BELOW ARE SYMPTOMS THAT SHOULD BE REPORTED IMMEDIATELY: *FEVER GREATER THAN 100.4 F (38 C) OR HIGHER *CHILLS OR SWEATING *NAUSEA AND VOMITING THAT IS NOT CONTROLLED WITH YOUR NAUSEA MEDICATION *UNUSUAL SHORTNESS OF BREATH *UNUSUAL BRUISING OR BLEEDING *URINARY PROBLEMS (pain or burning when urinating, or frequent urination) *BOWEL PROBLEMS (unusual diarrhea, constipation, pain near the anus) TENDERNESS IN MOUTH AND THROAT WITH OR WITHOUT PRESENCE OF ULCERS (sore throat, sores in mouth, or a toothache) UNUSUAL RASH, SWELLING OR PAIN  UNUSUAL VAGINAL DISCHARGE OR ITCHING   Items with * indicate a potential emergency and should be followed up as soon as possible or go to the Emergency Department if any problems should occur.  Please show the CHEMOTHERAPY ALERT CARD or IMMUNOTHERAPY  ALERT CARD at check-in to the Emergency Department and triage nurse. Should you have questions after your visit or need to cancel or reschedule your appointment, please contact [facility] - A DEPT OF [facility]  [phone] and follow the prompts.  Office hours are 8:00 a.m. to 4:30 p.m. Monday - Friday. Please note that voicemails left after 4:00 p.m. may not be returned until the following business day.  We are closed weekends and major holidays. You have access to a nurse at all times for urgent questions. Please call the main number to the clinic [phone] and follow the prompts.  For any non-urgent questions, you may also contact your provider using MyChart. We now offer e-Visits for anyone 7 and older to request care online for non-urgent symptoms. For details visit [email or web address].   Also download the MyChart app! Go to the app store, search "MyChart", open the app, select [place], and log in with your MyChart username and password.

## 2023-11-26 LAB — T4: T4, Total: 8 ug/dL (ref 4.5–12.0)

## 2023-11-27 ENCOUNTER — Other Ambulatory Visit: Payer: Self-pay | Admitting: Hematology & Oncology

## 2023-11-28 ENCOUNTER — Inpatient Hospital Stay

## 2023-11-28 VITALS — BP 107/63 | HR 63 | Temp 98.1°F | Resp 17

## 2023-11-28 DIAGNOSIS — Z5112 Encounter for antineoplastic immunotherapy: Secondary | ICD-10-CM | POA: Diagnosis not present

## 2023-11-28 DIAGNOSIS — D6481 Anemia due to antineoplastic chemotherapy: Secondary | ICD-10-CM

## 2023-11-28 LAB — INHIBIN B: Inhibin B: 113.7 pg/mL — ABNORMAL HIGH (ref 0.0–16.9)

## 2023-11-28 MED ORDER — SODIUM CHLORIDE 0.9% FLUSH
10.0000 mL | Freq: Once | INTRAVENOUS | Status: AC
  Administered 2023-11-28: 10 mL via INTRAVENOUS

## 2023-11-28 MED ORDER — SODIUM CHLORIDE 0.9 % IV SOLN
125.0000 mg | Freq: Once | INTRAVENOUS | Status: AC
  Administered 2023-11-28: 125 mg via INTRAVENOUS
  Filled 2023-11-28: qty 10

## 2023-11-28 MED ORDER — HEPARIN SOD (PORK) LOCK FLUSH 100 UNIT/ML IV SOLN
500.0000 [IU] | Freq: Once | INTRAVENOUS | Status: AC
  Administered 2023-11-28: 500 [IU] via INTRAVENOUS

## 2023-11-28 MED ORDER — SODIUM CHLORIDE 0.9 % IV SOLN: INTRAVENOUS | Status: DC

## 2023-11-28 NOTE — Patient Instructions (Signed)
Sodium Ferric Gluconate Complex Injection What is this medication? SODIUM FERRIC GLUCONATE COMPLEX (SOE dee um FER ik GLOO koe nate KOM pleks) treats low levels of iron (iron deficiency anemia) in people with kidney disease. Iron is a mineral that plays an important role in making red blood cells, which carry oxygen from your lungs to the rest of your body. This medicine may be used for other purposes; ask your health care provider or pharmacist if you have questions. COMMON BRAND NAME(S): Ferrlecit, Nulecit What should I tell my care team before I take this medication? They need to know if you have any of the following conditions: Anemia that is not from iron deficiency High levels of iron in the blood An unusual or allergic reaction to iron, other medications, foods, dyes, or preservatives Pregnant or are trying to become pregnant Breast-feeding How should I use this medication? This medication is injected into a vein. It is given by your care team in a hospital or clinic setting. Talk to your care team about the use of this medication in children. While it may be prescribed for children as young as 6 years for selected conditions, precautions do apply. Overdosage: If you think you have taken too much of this medicine contact a poison control center or emergency room at once. NOTE: This medicine is only for you. Do not share this medicine with others. What if I miss a dose? It is important not to miss your dose. Call your care team if you are unable to keep an appointment. What may interact with this medication? Do not take this medication with any of the following: Deferasirox Deferoxamine Dimercaprol This medication may also interact with the following: Other iron products This list may not describe all possible interactions. Give your health care provider a list of all the medicines, herbs, non-prescription drugs, or dietary supplements you use. Also tell them if you smoke, drink  alcohol, or use illegal drugs. Some items may interact with your medicine. What should I watch for while using this medication? Your condition will be monitored carefully while you are receiving this medication. Visit your care team for regular checks on your progress. You may need blood work while you are taking this medication. What side effects may I notice from receiving this medication? Side effects that you should report to your care team as soon as possible: Allergic reactions--skin rash, itching, hives, swelling of the face, lips, tongue, or throat Low blood pressure--dizziness, feeling faint or lightheaded, blurry vision Shortness of breath Side effects that usually do not require medical attention (report to your care team if they continue or are bothersome): Flushing Headache Joint pain Muscle pain Nausea Pain, redness, or irritation at injection site This list may not describe all possible side effects. Call your doctor for medical advice about side effects. You may report side effects to FDA at 1-800-FDA-1088. Where should I keep my medication? This medication is given in a hospital or clinic and will not be stored at home. NOTE: This sheet is a summary. It may not cover all possible information. If you have questions about this medicine, talk to your doctor, pharmacist, or health care provider.  2024 Elsevier/Gold Standard (2020-11-03 00:00:00)

## 2023-12-01 ENCOUNTER — Other Ambulatory Visit: Payer: Self-pay | Admitting: Hematology & Oncology

## 2023-12-01 ENCOUNTER — Encounter: Payer: Self-pay | Admitting: *Deleted

## 2023-12-01 ENCOUNTER — Other Ambulatory Visit: Payer: Self-pay | Admitting: *Deleted

## 2023-12-01 ENCOUNTER — Ambulatory Visit: Attending: Internal Medicine | Admitting: *Deleted

## 2023-12-01 ENCOUNTER — Ambulatory Visit: Payer: Self-pay | Admitting: Hematology & Oncology

## 2023-12-01 DIAGNOSIS — Z953 Presence of xenogenic heart valve: Secondary | ICD-10-CM

## 2023-12-01 DIAGNOSIS — Z952 Presence of prosthetic heart valve: Secondary | ICD-10-CM

## 2023-12-01 DIAGNOSIS — Z5181 Encounter for therapeutic drug level monitoring: Secondary | ICD-10-CM

## 2023-12-01 DIAGNOSIS — I480 Paroxysmal atrial fibrillation: Secondary | ICD-10-CM

## 2023-12-01 DIAGNOSIS — F419 Anxiety disorder, unspecified: Secondary | ICD-10-CM

## 2023-12-01 LAB — POCT INR: INR: 1.9 — AB (ref 2.0–3.0)

## 2023-12-01 MED ORDER — POTASSIUM CHLORIDE ER 20 MEQ PO TBCR: 20.0000 meq | EXTENDED_RELEASE_TABLET | Freq: Every day | ORAL | 3 refills | Status: AC

## 2023-12-01 NOTE — Patient Instructions (Signed)
 Description   NO PRINTOUT. Today take 1 tablet of warfarin then continue taking warfarin 1/2 tablet daily except for 1 tablet on Tuesdays, Thursdays, and Saturdays.  Recheck INR in 6 weeks.  Coumadin  Clinic [phone]

## 2023-12-02 ENCOUNTER — Inpatient Hospital Stay

## 2023-12-02 ENCOUNTER — Ambulatory Visit

## 2023-12-02 ENCOUNTER — Ambulatory Visit: Admitting: Hematology & Oncology

## 2023-12-02 ENCOUNTER — Other Ambulatory Visit

## 2023-12-04 ENCOUNTER — Other Ambulatory Visit: Payer: Self-pay | Admitting: Hematology & Oncology

## 2023-12-04 ENCOUNTER — Inpatient Hospital Stay

## 2023-12-04 VITALS — BP 100/56 | HR 66 | Temp 97.9°F | Resp 17

## 2023-12-04 DIAGNOSIS — D3910 Neoplasm of uncertain behavior of unspecified ovary: Secondary | ICD-10-CM

## 2023-12-04 DIAGNOSIS — Z5112 Encounter for antineoplastic immunotherapy: Secondary | ICD-10-CM | POA: Diagnosis not present

## 2023-12-04 DIAGNOSIS — D6481 Anemia due to antineoplastic chemotherapy: Secondary | ICD-10-CM

## 2023-12-04 MED ORDER — SODIUM CHLORIDE 0.9 % IV SOLN: Freq: Once | INTRAVENOUS | Status: AC

## 2023-12-04 MED ORDER — SODIUM CHLORIDE 0.9 % IV SOLN
125.0000 mg | Freq: Once | INTRAVENOUS | Status: AC
  Administered 2023-12-04: 125 mg via INTRAVENOUS
  Filled 2023-12-04: qty 10

## 2023-12-04 NOTE — Patient Instructions (Signed)
Sodium Ferric Gluconate Complex Injection What is this medication? SODIUM FERRIC GLUCONATE COMPLEX (SOE dee um FER ik GLOO koe nate KOM pleks) treats low levels of iron (iron deficiency anemia) in people with kidney disease. Iron is a mineral that plays an important role in making red blood cells, which carry oxygen from your lungs to the rest of your body. This medicine may be used for other purposes; ask your health care provider or pharmacist if you have questions. COMMON BRAND NAME(S): Ferrlecit, Nulecit What should I tell my care team before I take this medication? They need to know if you have any of the following conditions: Anemia that is not from iron deficiency High levels of iron in the blood An unusual or allergic reaction to iron, other medications, foods, dyes, or preservatives Pregnant or are trying to become pregnant Breast-feeding How should I use this medication? This medication is injected into a vein. It is given by your care team in a hospital or clinic setting. Talk to your care team about the use of this medication in children. While it may be prescribed for children as young as 6 years for selected conditions, precautions do apply. Overdosage: If you think you have taken too much of this medicine contact a poison control center or emergency room at once. NOTE: This medicine is only for you. Do not share this medicine with others. What if I miss a dose? It is important not to miss your dose. Call your care team if you are unable to keep an appointment. What may interact with this medication? Do not take this medication with any of the following: Deferasirox Deferoxamine Dimercaprol This medication may also interact with the following: Other iron products This list may not describe all possible interactions. Give your health care provider a list of all the medicines, herbs, non-prescription drugs, or dietary supplements you use. Also tell them if you smoke, drink  alcohol, or use illegal drugs. Some items may interact with your medicine. What should I watch for while using this medication? Your condition will be monitored carefully while you are receiving this medication. Visit your care team for regular checks on your progress. You may need blood work while you are taking this medication. What side effects may I notice from receiving this medication? Side effects that you should report to your care team as soon as possible: Allergic reactions--skin rash, itching, hives, swelling of the face, lips, tongue, or throat Low blood pressure--dizziness, feeling faint or lightheaded, blurry vision Shortness of breath Side effects that usually do not require medical attention (report to your care team if they continue or are bothersome): Flushing Headache Joint pain Muscle pain Nausea Pain, redness, or irritation at injection site This list may not describe all possible side effects. Call your doctor for medical advice about side effects. You may report side effects to FDA at 1-800-FDA-1088. Where should I keep my medication? This medication is given in a hospital or clinic and will not be stored at home. NOTE: This sheet is a summary. It may not cover all possible information. If you have questions about this medicine, talk to your doctor, pharmacist, or health care provider.  2024 Elsevier/Gold Standard (2020-11-03 00:00:00)

## 2023-12-05 ENCOUNTER — Inpatient Hospital Stay

## 2023-12-09 NOTE — Progress Notes (Signed)
 Zometa  removed from supportive care per Dr. Birt Bulla instructions.

## 2023-12-16 ENCOUNTER — Inpatient Hospital Stay

## 2023-12-16 ENCOUNTER — Encounter: Payer: Self-pay | Admitting: Hematology & Oncology

## 2023-12-16 ENCOUNTER — Ambulatory Visit: Payer: Self-pay | Admitting: Hematology & Oncology

## 2023-12-16 ENCOUNTER — Inpatient Hospital Stay (HOSPITAL_BASED_OUTPATIENT_CLINIC_OR_DEPARTMENT_OTHER): Admitting: Family

## 2023-12-16 ENCOUNTER — Inpatient Hospital Stay: Admitting: Hematology & Oncology

## 2023-12-16 ENCOUNTER — Encounter: Payer: Self-pay | Admitting: Family

## 2023-12-16 VITALS — BP 93/57 | HR 71 | Resp 17

## 2023-12-16 VITALS — BP 97/63 | HR 82 | Temp 97.8°F | Resp 18

## 2023-12-16 DIAGNOSIS — D3910 Neoplasm of uncertain behavior of unspecified ovary: Secondary | ICD-10-CM

## 2023-12-16 DIAGNOSIS — Z953 Presence of xenogenic heart valve: Secondary | ICD-10-CM

## 2023-12-16 DIAGNOSIS — D500 Iron deficiency anemia secondary to blood loss (chronic): Secondary | ICD-10-CM

## 2023-12-16 DIAGNOSIS — E039 Hypothyroidism, unspecified: Secondary | ICD-10-CM

## 2023-12-16 DIAGNOSIS — D6481 Anemia due to antineoplastic chemotherapy: Secondary | ICD-10-CM

## 2023-12-16 DIAGNOSIS — T451X5A Adverse effect of antineoplastic and immunosuppressive drugs, initial encounter: Secondary | ICD-10-CM

## 2023-12-16 DIAGNOSIS — Z5112 Encounter for antineoplastic immunotherapy: Secondary | ICD-10-CM | POA: Diagnosis not present

## 2023-12-16 LAB — CBC WITH DIFFERENTIAL (CANCER CENTER ONLY)
Abs Immature Granulocytes: 0.05 10*3/uL (ref 0.00–0.07)
Basophils Absolute: 0 10*3/uL (ref 0.0–0.1)
Basophils Relative: 0 %
Eosinophils Absolute: 0.1 10*3/uL (ref 0.0–0.5)
Eosinophils Relative: 1 %
HCT: 35.5 % — ABNORMAL LOW (ref 36.0–46.0)
Hemoglobin: 11.7 g/dL — ABNORMAL LOW (ref 12.0–15.0)
Immature Granulocytes: 1 %
Lymphocytes Relative: 8 %
Lymphs Abs: 0.7 10*3/uL (ref 0.7–4.0)
MCH: 29.2 pg (ref 26.0–34.0)
MCHC: 33 g/dL (ref 30.0–36.0)
MCV: 88.5 fL (ref 80.0–100.0)
Monocytes Absolute: 0.7 10*3/uL (ref 0.1–1.0)
Monocytes Relative: 7 %
Neutro Abs: 7.8 10*3/uL — ABNORMAL HIGH (ref 1.7–7.7)
Neutrophils Relative %: 83 %
Platelet Count: 210 10*3/uL (ref 150–400)
RBC: 4.01 MIL/uL (ref 3.87–5.11)
RDW: 14.3 % (ref 11.5–15.5)
WBC Count: 9.4 10*3/uL (ref 4.0–10.5)
nRBC: 0 % (ref 0.0–0.2)

## 2023-12-16 LAB — CMP (CANCER CENTER ONLY)
ALT: 6 U/L (ref 0–44)
AST: 11 U/L — ABNORMAL LOW (ref 15–41)
Albumin: 3.8 g/dL (ref 3.5–5.0)
Alkaline Phosphatase: 44 U/L (ref 38–126)
Anion gap: 8 (ref 5–15)
BUN: 10 mg/dL (ref 8–23)
CO2: 27 mmol/L (ref 22–32)
Calcium: 9 mg/dL (ref 8.9–10.3)
Chloride: 102 mmol/L (ref 98–111)
Creatinine: 0.97 mg/dL (ref 0.44–1.00)
GFR, Estimated: >60 mL/min (ref >60)
Glucose, Bld: 99 mg/dL (ref 70–99)
Potassium: 3.4 mmol/L — ABNORMAL LOW (ref 3.5–5.1)
Sodium: 137 mmol/L (ref 135–145)
Total Bilirubin: 0.4 mg/dL (ref 0.0–1.2)
Total Protein: 5.9 g/dL — ABNORMAL LOW (ref 6.5–8.1)

## 2023-12-16 LAB — IRON AND IRON BINDING CAPACITY (CC-WL,HP ONLY)
Iron: 62 ug/dL (ref 28–170)
Saturation Ratios: 22 % (ref 10.4–31.8)
TIBC: 288 ug/dL (ref 250–450)
UIBC: 226 ug/dL (ref 148–442)

## 2023-12-16 LAB — FERRITIN: Ferritin: 124 ng/mL (ref 11–307)

## 2023-12-16 LAB — TSH: TSH: 0.523 u[IU]/mL (ref 0.350–4.500)

## 2023-12-16 MED ORDER — HEPARIN SOD (PORK) LOCK FLUSH 100 UNIT/ML IV SOLN
500.0000 [IU] | Freq: Once | INTRAVENOUS | Status: AC | PRN
Start: 2023-12-16 — End: 2023-12-16
  Administered 2023-12-16: 500 [IU]

## 2023-12-16 MED ORDER — SODIUM CHLORIDE 0.9 % IV SOLN: Freq: Once | INTRAVENOUS | Status: AC

## 2023-12-16 MED ORDER — SODIUM CHLORIDE 0.9 % IV SOLN
200.0000 mg | Freq: Once | INTRAVENOUS | Status: AC
  Administered 2023-12-16: 200 mg via INTRAVENOUS
  Filled 2023-12-16: qty 8

## 2023-12-16 MED ORDER — SODIUM CHLORIDE 0.9% FLUSH
10.0000 mL | INTRAVENOUS | Status: DC | PRN
  Administered 2023-12-16: 10 mL

## 2023-12-16 NOTE — Patient Instructions (Signed)

## 2023-12-16 NOTE — Progress Notes (Signed)
 Hematology and Oncology Follow Up Visit  [name] [id number] Aug 20, 1960 63 y.o. 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 She is feeling fatigued, nauseated and has had some GI spasms today. She states that she had a GI bug last week with diarrhea. The diarrhea has resolved and Ativan  has helped relieve her nausea.  She hasn't had much of an appetite but is doing her best to stay hydrated. Her weight is down 3 lbs since has last visit.  She completed her second dose of Ferrlecit  last week.  Inhibin B a few weeks ago was 113 (prev 96). She is considering have the repeat CT scans but would like to see what her inhibin B level is today.  No fever, chills, vomiting, cough, rash, dizziness, SOB, chest pain, palpitations or changes in bowel or bladder habits.  No swelling, tenderness in her extremities.  She has noted numbness and tingling in the hands and feet.  No falls or syncope reported. She has not noted any obvious blood loss. No bruising or petechiae.   ECOG Performance Status: 1 - Symptomatic but completely ambulatory  Medications:  Allergies as of 12/16/2023       Reactions   Augmentin [amoxicillin -pot Clavulanate] Other (See Comments)   Muscle Aches   Prochlorperazine Edisylate Other (See Comments)   Nervous/ flutter/ shakes   Adhesive [tape] Hives   Redness/hives from adhesive tape( tolerates latex gloves); tolerates paper tape        Medication List        Accurate as of December 16, 2023 11:36 AM. If you have any questions, ask your nurse or doctor.          albuterol  108 (90 Base)  MCG/ACT inhaler Commonly known as: VENTOLIN  HFA TAKE 2 PUFFS BY MOUTH EVERY 6 HOURS AS NEEDED FOR WHEEZE OR SHORTNESS OF BREATH   amoxicillin  500 MG tablet Commonly known as: AMOXIL  Take 2,000 mg by mouth See admin instructions. Take 2,000 mg one hour prior to dental visits.   aspirin  EC 81 MG tablet Take 81 mg by mouth daily.   azithromycin  500 MG tablet Commonly known as: ZITHROMAX  Take 500 mg by mouth daily.   b complex vitamins capsule Take 1 capsule by mouth 2 (two) times daily.   carvedilol  3.125 MG tablet Commonly known as: COREG  Take 1 tablet (3.125 mg total) by mouth 2 (two) times daily with a meal.   cholecalciferol  25 MCG (1000 UNIT) tablet Commonly known as: VITAMIN D3 Take 4,000 Units by mouth daily.   furosemide  20 MG tablet Commonly known as: LASIX  Take 1 tablet (20 mg total) by mouth daily.   levothyroxine  100 MCG tablet Commonly known as: SYNTHROID  Take 1 tablet (100 mcg total) by mouth daily before breakfast.   lidocaine -prilocaine  cream Commonly known as: EMLA  Apply 1 Application topically as needed. Apply to port per instructions   loperamide  2 MG tablet Commonly known as: IMODIUM  A-D Take 4 mg by mouth 3 (three) times daily as needed for diarrhea or loose stools.   LORazepam  1 MG tablet Commonly known as: ATIVAN  TAKE  ONE TABLET BY MOUTH EVERY 8 HOURS AS NEEDED FOR ANXIETY   methocarbamol  500 MG tablet Commonly known as: ROBAXIN  TAKE ONE TABLET BY MOUTH EVERY 6 HOURS AS NEEDED FOR MUSCLE SPASMS   Potassium Chloride  ER 20 MEQ Tbcr Take 1 tablet (20 mEq total) by mouth daily.   potassium chloride  SA 20 MEQ tablet Commonly known as: KLOR-CON  M Take 20 mEq by mouth daily.   sodium chloride  0.9 % SOLN 50 mL with pembrolizumab  100 MG/4ML SOLN 200 mg Inject 200 mg into the vein every 21 ( twenty-one) days.   spironolactone  25 MG tablet Commonly known as: ALDACTONE  Take 1 tablet (25 mg total) by mouth daily.   traMADol  50 MG tablet Commonly  known as: ULTRAM  TAKE ONE TABLET BY MOUTH EVERY 6 HOURS AS NEEDED   Vitamin D  (Ergocalciferol ) 1.25 MG (50000 UNIT) Caps capsule Commonly known as: DRISDOL  TAKE ONE CAPSULE BY MOUTH every 7 DAYS   warfarin 7.5 MG tablet Commonly known as: COUMADIN  Take as directed by the anticoagulation clinic. If you are unsure how to take this medication, talk to your nurse or doctor. Original instructions: Take 1/2 tablet to 1 tablet by mouth daily as directed by Anticoagulation Clinic.   zolpidem  10 MG tablet Commonly known as: AMBIEN  TAKE ONE TABLET BY MOUTH EVERY DAY        Allergies:  Allergies  Allergen Reactions   Augmentin [Amoxicillin -Pot Clavulanate] Other (See Comments)    Muscle Aches   Prochlorperazine Edisylate Other (See Comments)    Nervous/ flutter/ shakes   Adhesive [Tape] Hives    Redness/hives from adhesive tape( tolerates latex gloves); tolerates paper tape    Past Medical History, Surgical history, Social history, and Family History were reviewed and updated.  Review of Systems: All other 10 point review of systems is negative.   Physical Exam:  vitals were not taken for this visit.   Wt Readings from Last 3 Encounters:  11/25/23 165 lb 4 oz (75 kg)  11/04/23 160 lb (72.6 kg)  10/13/23 161 lb 12.8 oz (73.4 kg)    Ocular: Sclerae unicteric, pupils equal, round and reactive to light Ear-nose-throat: Oropharynx clear, dentition fair Lymphatic: No cervical or supraclavicular adenopathy Lungs no rales or rhonchi, good excursion bilaterally Heart regular rate and rhythm, no murmur appreciated Abd soft, nontender, positive bowel sounds MSK no focal spinal tenderness, no joint edema Neuro: non-focal, well-oriented, appropriate affect Breasts: Deferred   Lab Results  Component Value Date   WBC 8.4 11/25/2023   HGB 10.9 (L) 11/25/2023   HCT 33.1 (L) 11/25/2023   MCV 88.5 11/25/2023   PLT 199 11/25/2023   Lab Results  Component Value Date   FERRITIN 103  11/25/2023   IRON 63 11/25/2023   TIBC 347 11/25/2023   UIBC 284 11/25/2023   IRONPCTSAT 18 11/25/2023   Lab Results  Component Value Date   RETICCTPCT 1.5 11/25/2023   RBC 3.74 (L) 11/25/2023   RBC 3.70 (L) 11/25/2023   No results found for: KPAFRELGTCHN, LAMBDASER, KAPLAMBRATIO No results found for: IGGSERUM, IGA, IGMSERUM No results found for: STEPHANY CARLOTA BENSON MARKEL EARLA JOANNIE DOC VICK, SPEI   Chemistry      Component Value Date/Time   NA 139 11/25/2023 0836   NA 139 08/14/2020 1428   NA 140 03/15/2016 0953   K 3.6 11/25/2023 0836   K 3.9 03/15/2016 0953   CL 104 11/25/2023 0836   CL 102 12/13/2015 0803   CO2 27 11/25/2023 0836   CO2  19 (L) 03/15/2016 0953   BUN 13 11/25/2023 0836   BUN 16 08/14/2020 1428   BUN 15.9 03/15/2016 0953   CREATININE 0.99 11/25/2023 0836   CREATININE 1.4 (H) 03/15/2016 0953      Component Value Date/Time   CALCIUM  8.8 (L) 11/25/2023 0836   CALCIUM  9.3 03/15/2016 0953   ALKPHOS 51 11/25/2023 0836   ALKPHOS 55 03/15/2016 0953   AST 14 (L) 11/25/2023 0836   AST 17 03/15/2016 0953   ALT 8 11/25/2023 0836   ALT 23 03/15/2016 0953   BILITOT 0.3 11/25/2023 0836   BILITOT 0.38 03/15/2016 0953       Impression and Plan: Ms. Soliday is a very pleasant 63 yo caucasian female with recurrent granulosa cell tumor on immunotherapy.  We will proceed with treatment today as planned. Patient in agreement.  Iron studies are pending. We will replace if needed.  TSH is also pending.  Follow-up in 3 weeks.    Lauraine Pepper, NP 6/24/202511:36 AM

## 2023-12-16 NOTE — Progress Notes (Signed)
 No Ferrlecit  today.  Bridgett Leach [place], [place], BCPS, BCOP 12/16/2023 12:14 PM

## 2023-12-16 NOTE — Patient Instructions (Signed)

## 2023-12-18 LAB — INHIBIN B: Inhibin B: 99.9 pg/mL — ABNORMAL HIGH (ref 0.0–16.9)

## 2023-12-24 ENCOUNTER — Other Ambulatory Visit: Payer: Self-pay | Admitting: Hematology & Oncology

## 2023-12-24 DIAGNOSIS — D3910 Neoplasm of uncertain behavior of unspecified ovary: Secondary | ICD-10-CM

## 2023-12-30 ENCOUNTER — Ambulatory Visit (INDEPENDENT_AMBULATORY_CARE_PROVIDER_SITE_OTHER): Payer: 59

## 2023-12-30 DIAGNOSIS — I255 Ischemic cardiomyopathy: Secondary | ICD-10-CM | POA: Diagnosis not present

## 2023-12-30 LAB — CUP PACEART REMOTE DEVICE CHECK
Battery Remaining Longevity: 68 mo
Battery Remaining Percentage: 66 %
Battery Voltage: 2.98 V
Brady Statistic AP VP Percent: 1 %
Brady Statistic AP VS Percent: 4.5 %
Brady Statistic AS VP Percent: 1 %
Brady Statistic AS VS Percent: 92 %
Brady Statistic RA Percent Paced: 1 %
Brady Statistic RV Percent Paced: 1 %
Date Time Interrogation Session: 20250708020259
HighPow Impedance: 72 Ohm
Implantable Lead Connection Status: 753985
Implantable Lead Connection Status: 753985
Implantable Lead Implant Date: 20110503
Implantable Lead Implant Date: 20130815
Implantable Lead Location: 753859
Implantable Lead Location: 753860
Implantable Pulse Generator Implant Date: 20211012
Lead Channel Impedance Value: 250 Ohm
Lead Channel Impedance Value: 360 Ohm
Lead Channel Pacing Threshold Amplitude: 0.5 V
Lead Channel Pacing Threshold Amplitude: 2 V
Lead Channel Pacing Threshold Pulse Width: 0.5 ms
Lead Channel Pacing Threshold Pulse Width: 1 ms
Lead Channel Sensing Intrinsic Amplitude: 1.8 mV
Lead Channel Sensing Intrinsic Amplitude: 11.9 mV
Lead Channel Setting Pacing Amplitude: 2 V
Lead Channel Setting Pacing Amplitude: 2.25 V
Lead Channel Setting Pacing Pulse Width: 1 ms
Lead Channel Setting Sensing Sensitivity: 0.5 mV
Pulse Gen Serial Number: 111028151
Zone Setting Status: 755011

## 2023-12-31 ENCOUNTER — Other Ambulatory Visit: Payer: Self-pay | Admitting: Hematology & Oncology

## 2023-12-31 ENCOUNTER — Other Ambulatory Visit: Payer: Self-pay | Admitting: Physician Assistant

## 2023-12-31 DIAGNOSIS — D3910 Neoplasm of uncertain behavior of unspecified ovary: Secondary | ICD-10-CM

## 2023-12-31 DIAGNOSIS — I480 Paroxysmal atrial fibrillation: Secondary | ICD-10-CM

## 2023-12-31 NOTE — Telephone Encounter (Signed)
 Warfarin 7.5mg  refill S/P mitral valve replacement with bioprosthetic valve  Last INR 12/01/23 Last OV 10/13/23

## 2024-01-01 ENCOUNTER — Ambulatory Visit: Payer: Self-pay | Admitting: Internal Medicine

## 2024-01-06 ENCOUNTER — Inpatient Hospital Stay

## 2024-01-06 ENCOUNTER — Encounter: Payer: Self-pay | Admitting: Medical Oncology

## 2024-01-06 ENCOUNTER — Inpatient Hospital Stay (HOSPITAL_BASED_OUTPATIENT_CLINIC_OR_DEPARTMENT_OTHER): Admitting: Medical Oncology

## 2024-01-06 ENCOUNTER — Inpatient Hospital Stay: Attending: Hematology & Oncology

## 2024-01-06 VITALS — BP 90/58 | HR 70 | Temp 98.1°F | Resp 18 | Ht 66.0 in | Wt 161.8 lb

## 2024-01-06 VITALS — BP 97/49 | HR 62

## 2024-01-06 DIAGNOSIS — R197 Diarrhea, unspecified: Secondary | ICD-10-CM | POA: Diagnosis not present

## 2024-01-06 DIAGNOSIS — D500 Iron deficiency anemia secondary to blood loss (chronic): Secondary | ICD-10-CM

## 2024-01-06 DIAGNOSIS — Z79620 Long term (current) use of immunosuppressive biologic: Secondary | ICD-10-CM | POA: Insufficient documentation

## 2024-01-06 DIAGNOSIS — D508 Other iron deficiency anemias: Secondary | ICD-10-CM | POA: Diagnosis not present

## 2024-01-06 DIAGNOSIS — Z7901 Long term (current) use of anticoagulants: Secondary | ICD-10-CM | POA: Insufficient documentation

## 2024-01-06 DIAGNOSIS — D3910 Neoplasm of uncertain behavior of unspecified ovary: Secondary | ICD-10-CM | POA: Diagnosis not present

## 2024-01-06 DIAGNOSIS — C569 Malignant neoplasm of unspecified ovary: Secondary | ICD-10-CM | POA: Insufficient documentation

## 2024-01-06 DIAGNOSIS — E039 Hypothyroidism, unspecified: Secondary | ICD-10-CM

## 2024-01-06 DIAGNOSIS — D6481 Anemia due to antineoplastic chemotherapy: Secondary | ICD-10-CM

## 2024-01-06 DIAGNOSIS — R202 Paresthesia of skin: Secondary | ICD-10-CM | POA: Insufficient documentation

## 2024-01-06 DIAGNOSIS — Z952 Presence of prosthetic heart valve: Secondary | ICD-10-CM | POA: Insufficient documentation

## 2024-01-06 DIAGNOSIS — K909 Intestinal malabsorption, unspecified: Secondary | ICD-10-CM | POA: Diagnosis not present

## 2024-01-06 DIAGNOSIS — N1831 Chronic kidney disease, stage 3a: Secondary | ICD-10-CM

## 2024-01-06 DIAGNOSIS — Z95828 Presence of other vascular implants and grafts: Secondary | ICD-10-CM

## 2024-01-06 DIAGNOSIS — T451X5A Adverse effect of antineoplastic and immunosuppressive drugs, initial encounter: Secondary | ICD-10-CM

## 2024-01-06 DIAGNOSIS — Z5112 Encounter for antineoplastic immunotherapy: Secondary | ICD-10-CM | POA: Diagnosis present

## 2024-01-06 DIAGNOSIS — R200 Anesthesia of skin: Secondary | ICD-10-CM | POA: Diagnosis not present

## 2024-01-06 LAB — CMP (CANCER CENTER ONLY)
ALT: 7 U/L (ref 0–44)
AST: 12 U/L — ABNORMAL LOW (ref 15–41)
Albumin: 3.9 g/dL (ref 3.5–5.0)
Alkaline Phosphatase: 49 U/L (ref 38–126)
Anion gap: 10 (ref 5–15)
BUN: 12 mg/dL (ref 8–23)
CO2: 27 mmol/L (ref 22–32)
Calcium: 9 mg/dL (ref 8.9–10.3)
Chloride: 100 mmol/L (ref 98–111)
Creatinine: 1.08 mg/dL — ABNORMAL HIGH (ref 0.44–1.00)
GFR, Estimated: 57 mL/min — ABNORMAL LOW (ref >60)
Glucose, Bld: 102 mg/dL — ABNORMAL HIGH (ref 70–99)
Potassium: 3.6 mmol/L (ref 3.5–5.1)
Sodium: 137 mmol/L (ref 135–145)
Total Bilirubin: 0.4 mg/dL (ref 0.0–1.2)
Total Protein: 6.2 g/dL — ABNORMAL LOW (ref 6.5–8.1)

## 2024-01-06 LAB — IRON AND IRON BINDING CAPACITY (CC-WL,HP ONLY)
Iron: 50 ug/dL (ref 28–170)
Saturation Ratios: 17 % (ref 10.4–31.8)
TIBC: 298 ug/dL (ref 250–450)
UIBC: 248 ug/dL

## 2024-01-06 LAB — RETICULOCYTES
Immature Retic Fract: 7.6 % (ref 2.3–15.9)
RBC.: 3.86 MIL/uL — ABNORMAL LOW (ref 3.87–5.11)
Retic Count, Absolute: 47.5 K/uL (ref 19.0–186.0)
Retic Ct Pct: 1.2 % (ref 0.4–3.1)

## 2024-01-06 LAB — CBC WITH DIFFERENTIAL (CANCER CENTER ONLY)
Abs Immature Granulocytes: 0.08 K/uL — ABNORMAL HIGH (ref 0.00–0.07)
Basophils Absolute: 0 K/uL (ref 0.0–0.1)
Basophils Relative: 0 %
Eosinophils Absolute: 0.1 K/uL (ref 0.0–0.5)
Eosinophils Relative: 2 %
HCT: 34.7 % — ABNORMAL LOW (ref 36.0–46.0)
Hemoglobin: 11.6 g/dL — ABNORMAL LOW (ref 12.0–15.0)
Immature Granulocytes: 1 %
Lymphocytes Relative: 9 %
Lymphs Abs: 0.7 K/uL (ref 0.7–4.0)
MCH: 29.8 pg (ref 26.0–34.0)
MCHC: 33.4 g/dL (ref 30.0–36.0)
MCV: 89.2 fL (ref 80.0–100.0)
Monocytes Absolute: 0.7 K/uL (ref 0.1–1.0)
Monocytes Relative: 9 %
Neutro Abs: 6 K/uL (ref 1.7–7.7)
Neutrophils Relative %: 79 %
Platelet Count: 184 K/uL (ref 150–400)
RBC: 3.89 MIL/uL (ref 3.87–5.11)
RDW: 14.3 % (ref 11.5–15.5)
WBC Count: 7.6 K/uL (ref 4.0–10.5)
nRBC: 0 % (ref 0.0–0.2)

## 2024-01-06 LAB — TSH: TSH: 0.379 u[IU]/mL (ref 0.350–4.500)

## 2024-01-06 LAB — FERRITIN: Ferritin: 100 ng/mL (ref 11–307)

## 2024-01-06 MED ORDER — SODIUM CHLORIDE 0.9 % IV SOLN
200.0000 mg | Freq: Once | INTRAVENOUS | Status: AC
  Administered 2024-01-06: 200 mg via INTRAVENOUS
  Filled 2024-01-06: qty 8

## 2024-01-06 MED ORDER — HEPARIN SOD (PORK) LOCK FLUSH 100 UNIT/ML IV SOLN
500.0000 [IU] | Freq: Once | INTRAVENOUS | Status: AC | PRN
  Administered 2024-01-06: 500 [IU]

## 2024-01-06 MED ORDER — SODIUM CHLORIDE 0.9% FLUSH
10.0000 mL | INTRAVENOUS | Status: DC | PRN
  Administered 2024-01-06: 10 mL

## 2024-01-06 MED ORDER — SODIUM CHLORIDE 0.9 % IV SOLN: Freq: Once | INTRAVENOUS | Status: AC

## 2024-01-06 NOTE — Patient Instructions (Signed)

## 2024-01-06 NOTE — Progress Notes (Signed)
 Hematology and Oncology Follow Up Visit  [name] [id number] 02/02/1961 63 y.o. 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   Today she states that she has been well.  At her last visit she was recovering for a GI bug. She reports that she is back to normal and has not had any significant issues. She does have occasional diarrhea but denies bloody movements or abdominal pains.  Inhibin B level breifly trended up to 113.7 but is back down to 99.9 as of 11/2023 No fever, chills, vomiting, cough, rash, dizziness, SOB, chest pain, palpitations or changes in bowel or bladder habits.  No swelling, tenderness in her extremities.  She has noted numbness and tingling in the hands and feet.  No falls or syncope reported. She has not noted any obvious blood loss. No bruising or petechiae.   ECOG Performance Status: 1 - Symptomatic but completely ambulatory Wt Readings from Last 3 Encounters:  01/06/24 161 lb 12.8 oz (73.4 kg)  11/25/23 165 lb 4 oz (75 kg)  11/04/23 160 lb (72.6 kg)     Medications:  Allergies as of 01/06/2024       Reactions   Augmentin [amoxicillin -pot Clavulanate] Other (See Comments)   Muscle Aches   Prochlorperazine Edisylate Other (See Comments)   Nervous/ flutter/ shakes   Adhesive [tape] Hives   Redness/hives from adhesive tape( tolerates latex gloves); tolerates paper tape        Medication List        Accurate as of January 06, 2024 12:26 PM. If you have any questions, ask your nurse or doctor.          STOP taking these medications    azithromycin  500 MG  tablet Commonly known as: ZITHROMAX  Stopped by: [name]       TAKE these medications    albuterol  108 (90 Base) MCG/ACT inhaler Commonly known as: VENTOLIN  HFA TAKE 2 PUFFS BY MOUTH EVERY 6 HOURS AS NEEDED FOR WHEEZE OR SHORTNESS OF BREATH   amoxicillin  500 MG tablet Commonly known as: AMOXIL  Take 2,000 mg by mouth See admin instructions. Take 2,000 mg one hour prior to dental visits.   aspirin  EC 81 MG tablet Take 81 mg by mouth daily.   b complex vitamins capsule Take 1 capsule by mouth 2 (two) times daily.   carvedilol  3.125 MG tablet Commonly known as: COREG  Take 1 tablet (3.125 mg total) by mouth 2 (two) times daily with a meal.   cholecalciferol  25 MCG (1000 UNIT) tablet Commonly known as: VITAMIN D3 Take 4,000 Units by mouth daily.   furosemide  20 MG tablet Commonly known as: LASIX  Take 1 tablet (20 mg total) by mouth daily.   levothyroxine  100 MCG tablet Commonly known as: SYNTHROID  Take 1 tablet (100 mcg total) by mouth daily before breakfast. What changed: Another medication with the same name was removed. Continue taking this medication, and follow the directions you see here. Changed by: [name]   lidocaine -prilocaine  cream Commonly known as: EMLA  Apply 1 Application topically as needed. Apply to port  per instructions   loperamide  2 MG tablet Commonly known as: IMODIUM  A-D Take 4 mg by mouth 3 (three) times daily as needed for diarrhea or loose stools.   LORazepam  1 MG tablet Commonly known as: ATIVAN  TAKE ONE TABLET BY MOUTH EVERY 8 HOURS AS NEEDED FOR ANXIETY   methocarbamol  500 MG tablet Commonly known as: ROBAXIN  TAKE ONE TABLET BY MOUTH EVERY 6 HOURS AS NEEDED FOR MUSCLE SPASMS   Potassium Chloride  ER 20 MEQ Tbcr Take 1 tablet (20 mEq total) by mouth daily.   potassium chloride  SA 20 MEQ tablet Commonly known as: KLOR-CON  M Take 20 mEq by mouth daily.   sodium chloride  0.9 % SOLN 50 mL with pembrolizumab  100 MG/4ML  SOLN 200 mg Inject 200 mg into the vein every 21 ( twenty-one) days.   spironolactone  25 MG tablet Commonly known as: ALDACTONE  Take 1 tablet (25 mg total) by mouth daily.   traMADol  50 MG tablet Commonly known as: ULTRAM  TAKE ONE TABLET BY MOUTH EVERY 6 HOURS AS NEEDED   Vitamin D  (Ergocalciferol ) 1.25 MG (50000 UNIT) Caps capsule Commonly known as: DRISDOL  TAKE ONE CAPSULE BY MOUTH every 7 DAYS   warfarin 7.5 MG tablet Commonly known as: COUMADIN  Take as directed by the anticoagulation clinic. If you are unsure how to take this medication, talk to your nurse or doctor. Original instructions: Take 1/2 tablet to 1 tablet by mouth daily as directed by Anticoagulation Clinic.   zolpidem  10 MG tablet Commonly known as: AMBIEN  TAKE ONE TABLET BY MOUTH EVERY DAY        Allergies:  Allergies  Allergen Reactions   Augmentin [Amoxicillin -Pot Clavulanate] Other (See Comments)    Muscle Aches   Prochlorperazine Edisylate Other (See Comments)    Nervous/ flutter/ shakes   Adhesive [Tape] Hives    Redness/hives from adhesive tape( tolerates latex gloves); tolerates paper tape    Past Medical History, Surgical history, Social history, and Family History were reviewed and updated.  Review of Systems: All other 10 point review of systems is negative.   Physical Exam:  height is 5' 6 (1.676 m) and weight is 161 lb 12.8 oz (73.4 kg). Her oral temperature is 98.1 F (36.7 C). Her blood pressure is 90/58 (abnormal) and her pulse is 70. Her respiration is 18 and oxygen saturation is 100%.   Wt Readings from Last 3 Encounters:  01/06/24 161 lb 12.8 oz (73.4 kg)  11/25/23 165 lb 4 oz (75 kg)  11/04/23 160 lb (72.6 kg)    Ocular: Sclerae unicteric, pupils equal, round and reactive to light Ear-nose-throat: Oropharynx clear, dentition fair Lymphatic: No cervical or supraclavicular adenopathy Lungs no rales or rhonchi, good excursion bilaterally Heart regular rate and rhythm, no  murmur appreciated Abd soft, nontender, positive bowel sounds MSK no focal spinal tenderness, no joint edema Neuro: non-focal, well-oriented, appropriate affect   Lab Results  Component Value Date   WBC 9.4 12/16/2023   HGB 11.7 (L) 12/16/2023   HCT 35.5 (L) 12/16/2023   MCV 88.5 12/16/2023   PLT 210 12/16/2023   Lab Results  Component Value Date   FERRITIN 124 12/16/2023   IRON 62 12/16/2023   TIBC 288 12/16/2023   UIBC 226 12/16/2023   IRONPCTSAT 22 12/16/2023   Lab Results  Component Value Date   RETICCTPCT 1.5 11/25/2023   RBC 4.01 12/16/2023   No results found for: KPAFRELGTCHN, LAMBDASER, KAPLAMBRATIO No results found for: IGGSERUM, IGA, IGMSERUM No results found for: TOTALPROTELP, ALBUMINELP, A1GS, A2GS,  EARLA JOANNIE DOC VICK, SPEI   Chemistry      Component Value Date/Time   NA 137 12/16/2023 1125   NA 139 08/14/2020 1428   NA 140 03/15/2016 0953   K 3.4 (L) 12/16/2023 1125   K 3.9 03/15/2016 0953   CL 102 12/16/2023 1125   CL 102 12/13/2015 0803   CO2 27 12/16/2023 1125   CO2 19 (L) 03/15/2016 0953   BUN 10 12/16/2023 1125   BUN 16 08/14/2020 1428   BUN 15.9 03/15/2016 0953   CREATININE 0.97 12/16/2023 1125   CREATININE 1.4 (H) 03/15/2016 0953      Component Value Date/Time   CALCIUM  9.0 12/16/2023 1125   CALCIUM  9.3 03/15/2016 0953   ALKPHOS 44 12/16/2023 1125   ALKPHOS 55 03/15/2016 0953   AST 11 (L) 12/16/2023 1125   AST 17 03/15/2016 0953   ALT 6 12/16/2023 1125   ALT 23 03/15/2016 0953   BILITOT 0.4 12/16/2023 1125   BILITOT 0.38 03/15/2016 0953     Encounter Diagnoses  Name Primary?   Granulosa cell tumor of ovary, unspecified laterality Yes   Anemia associated with chemotherapy    Iron deficiency anemia due to chronic blood loss    Hypothyroidism (acquired)    Stage 3a chronic kidney disease (HCC)    Port-A-Cath in place    Impression and Plan: Ms. Steven is a very pleasant 63 yo caucasian female  with recurrent granulosa cell tumor on immunotherapy.   CBC and CMP reviewed today- We will proceed with treatment today as planned. Iron studies are pending. We will replace if needed.  TSH pending today  RTC 3 weeks MD, port labs (CBC w/, CMP, iron, ferritin, Inhibin B), [place]  [name], [place] 7/15/202512:26 PM

## 2024-01-07 LAB — T4: T4, Total: 9.1 ug/dL (ref 4.5–12.0)

## 2024-01-13 ENCOUNTER — Ambulatory Visit

## 2024-01-16 ENCOUNTER — Ambulatory Visit: Attending: Internal Medicine

## 2024-01-16 DIAGNOSIS — Z953 Presence of xenogenic heart valve: Secondary | ICD-10-CM

## 2024-01-16 DIAGNOSIS — Z5181 Encounter for therapeutic drug level monitoring: Secondary | ICD-10-CM | POA: Diagnosis not present

## 2024-01-16 DIAGNOSIS — Z952 Presence of prosthetic heart valve: Secondary | ICD-10-CM | POA: Diagnosis not present

## 2024-01-16 DIAGNOSIS — I480 Paroxysmal atrial fibrillation: Secondary | ICD-10-CM | POA: Diagnosis not present

## 2024-01-16 LAB — POCT INR: INR: 1.8 — AB (ref 2.0–3.0)

## 2024-01-16 NOTE — Patient Instructions (Signed)
 NO PRINTOUT. Today take 1.5 tablets of warfarin then continue taking warfarin 1/2 tablet daily except for 1 tablet on Tuesdays, Thursdays, and Saturdays.  Recheck INR in 6 weeks.  Coumadin  Clinic [phone]

## 2024-01-16 NOTE — Progress Notes (Signed)
 INR 1.8 Please see anticoagulation encounter

## 2024-01-21 ENCOUNTER — Other Ambulatory Visit: Payer: Self-pay | Admitting: Hematology & Oncology

## 2024-01-21 DIAGNOSIS — D3910 Neoplasm of uncertain behavior of unspecified ovary: Secondary | ICD-10-CM

## 2024-01-27 ENCOUNTER — Inpatient Hospital Stay: Attending: Hematology & Oncology

## 2024-01-27 ENCOUNTER — Inpatient Hospital Stay (HOSPITAL_BASED_OUTPATIENT_CLINIC_OR_DEPARTMENT_OTHER): Admitting: Hematology & Oncology

## 2024-01-27 ENCOUNTER — Inpatient Hospital Stay

## 2024-01-27 ENCOUNTER — Other Ambulatory Visit: Payer: Self-pay

## 2024-01-27 VITALS — BP 110/66 | HR 52 | Temp 98.2°F | Resp 18 | Ht 66.0 in | Wt 161.0 lb

## 2024-01-27 VITALS — BP 111/47 | HR 74 | Resp 18

## 2024-01-27 DIAGNOSIS — Z79620 Long term (current) use of immunosuppressive biologic: Secondary | ICD-10-CM | POA: Diagnosis not present

## 2024-01-27 DIAGNOSIS — N1831 Chronic kidney disease, stage 3a: Secondary | ICD-10-CM | POA: Diagnosis not present

## 2024-01-27 DIAGNOSIS — Z7901 Long term (current) use of anticoagulants: Secondary | ICD-10-CM | POA: Insufficient documentation

## 2024-01-27 DIAGNOSIS — K909 Intestinal malabsorption, unspecified: Secondary | ICD-10-CM | POA: Insufficient documentation

## 2024-01-27 DIAGNOSIS — D500 Iron deficiency anemia secondary to blood loss (chronic): Secondary | ICD-10-CM

## 2024-01-27 DIAGNOSIS — D3910 Neoplasm of uncertain behavior of unspecified ovary: Secondary | ICD-10-CM

## 2024-01-27 DIAGNOSIS — C569 Malignant neoplasm of unspecified ovary: Secondary | ICD-10-CM | POA: Insufficient documentation

## 2024-01-27 DIAGNOSIS — Z952 Presence of prosthetic heart valve: Secondary | ICD-10-CM | POA: Diagnosis not present

## 2024-01-27 DIAGNOSIS — Z5112 Encounter for antineoplastic immunotherapy: Secondary | ICD-10-CM | POA: Insufficient documentation

## 2024-01-27 DIAGNOSIS — D6481 Anemia due to antineoplastic chemotherapy: Secondary | ICD-10-CM

## 2024-01-27 DIAGNOSIS — D508 Other iron deficiency anemias: Secondary | ICD-10-CM | POA: Insufficient documentation

## 2024-01-27 LAB — CBC WITH DIFFERENTIAL (CANCER CENTER ONLY)
Abs Immature Granulocytes: 0.03 K/uL (ref 0.00–0.07)
Basophils Absolute: 0 K/uL (ref 0.0–0.1)
Basophils Relative: 0 %
Eosinophils Absolute: 0.1 K/uL (ref 0.0–0.5)
Eosinophils Relative: 1 %
HCT: 33.6 % — ABNORMAL LOW (ref 36.0–46.0)
Hemoglobin: 11.1 g/dL — ABNORMAL LOW (ref 12.0–15.0)
Immature Granulocytes: 0 %
Lymphocytes Relative: 7 %
Lymphs Abs: 0.5 K/uL — ABNORMAL LOW (ref 0.7–4.0)
MCH: 29.9 pg (ref 26.0–34.0)
MCHC: 33 g/dL (ref 30.0–36.0)
MCV: 90.6 fL (ref 80.0–100.0)
Monocytes Absolute: 0.6 K/uL (ref 0.1–1.0)
Monocytes Relative: 8 %
Neutro Abs: 6.5 K/uL (ref 1.7–7.7)
Neutrophils Relative %: 84 %
Platelet Count: 185 K/uL (ref 150–400)
RBC: 3.71 MIL/uL — ABNORMAL LOW (ref 3.87–5.11)
RDW: 14 % (ref 11.5–15.5)
WBC Count: 7.8 K/uL (ref 4.0–10.5)
nRBC: 0 % (ref 0.0–0.2)

## 2024-01-27 LAB — IRON AND IRON BINDING CAPACITY (CC-WL,HP ONLY)
Iron: 59 ug/dL (ref 28–170)
Saturation Ratios: 19 % (ref 10.4–31.8)
TIBC: 318 ug/dL (ref 250–450)
UIBC: 259 ug/dL

## 2024-01-27 LAB — TSH: TSH: 0.139 u[IU]/mL — ABNORMAL LOW (ref 0.350–4.500)

## 2024-01-27 LAB — CMP (CANCER CENTER ONLY)
ALT: 9 U/L (ref 0–44)
AST: 16 U/L (ref 15–41)
Albumin: 4 g/dL (ref 3.5–5.0)
Alkaline Phosphatase: 55 U/L (ref 38–126)
Anion gap: 11 (ref 5–15)
BUN: 9 mg/dL (ref 8–23)
CO2: 26 mmol/L (ref 22–32)
Calcium: 8.9 mg/dL (ref 8.9–10.3)
Chloride: 102 mmol/L (ref 98–111)
Creatinine: 0.93 mg/dL (ref 0.44–1.00)
GFR, Estimated: >60 mL/min (ref >60)
Glucose, Bld: 93 mg/dL (ref 70–99)
Potassium: 3.7 mmol/L (ref 3.5–5.1)
Sodium: 139 mmol/L (ref 135–145)
Total Bilirubin: 0.3 mg/dL (ref 0.0–1.2)
Total Protein: 6.1 g/dL — ABNORMAL LOW (ref 6.5–8.1)

## 2024-01-27 LAB — FERRITIN: Ferritin: 69 ng/mL (ref 11–307)

## 2024-01-27 MED ORDER — SODIUM CHLORIDE 0.9 % IV SOLN
Freq: Once | INTRAVENOUS | Status: AC
Start: 2024-01-27 — End: 2024-01-27

## 2024-01-27 MED ORDER — SODIUM CHLORIDE 0.9 % IV SOLN
200.0000 mg | Freq: Once | INTRAVENOUS | Status: AC
  Administered 2024-01-27: 200 mg via INTRAVENOUS
  Filled 2024-01-27: qty 8

## 2024-01-27 NOTE — Patient Instructions (Signed)

## 2024-01-27 NOTE — Patient Instructions (Signed)
 [facility] - A DEPT OF [facility]  Discharge Instructions: Thank you for choosing [facility] to provide your oncology and hematology care.   If you have a lab appointment with the Cancer Center, please go directly to the Cancer Center and check in at the registration area.  Wear comfortable clothing and clothing appropriate for easy access to any Portacath or PICC line.   We strive to give you quality time with your provider. You may need to reschedule your appointment if you arrive late (15 or more minutes).  Arriving late affects you and other patients whose appointments are after yours.  Also, if you miss three or more appointments without notifying the office, you may be dismissed from the clinic at the provider's discretion.      For prescription refill requests, have your pharmacy contact our office and allow 72 hours for refills to be completed.    Today you received the following chemotherapy and/or immunotherapy agents Keytruda       To help prevent nausea and vomiting after your treatment, we encourage you to take your nausea medication as directed.  BELOW ARE SYMPTOMS THAT SHOULD BE REPORTED IMMEDIATELY: *FEVER GREATER THAN 100.4 F (38 C) OR HIGHER *CHILLS OR SWEATING *NAUSEA AND VOMITING THAT IS NOT CONTROLLED WITH YOUR NAUSEA MEDICATION *UNUSUAL SHORTNESS OF BREATH *UNUSUAL BRUISING OR BLEEDING *URINARY PROBLEMS (pain or burning when urinating, or frequent urination) *BOWEL PROBLEMS (unusual diarrhea, constipation, pain near the anus) TENDERNESS IN MOUTH AND THROAT WITH OR WITHOUT PRESENCE OF ULCERS (sore throat, sores in mouth, or a toothache) UNUSUAL RASH, SWELLING OR PAIN  UNUSUAL VAGINAL DISCHARGE OR ITCHING   Items with * indicate a potential emergency and should be followed up as soon as possible or go to the Emergency Department if any problems should occur.  Please show the CHEMOTHERAPY ALERT CARD or IMMUNOTHERAPY  ALERT CARD at check-in to the Emergency Department and triage nurse. Should you have questions after your visit or need to cancel or reschedule your appointment, please contact [facility] - A DEPT OF [facility]  [phone] and follow the prompts.  Office hours are 8:00 a.m. to 4:30 p.m. Monday - Friday. Please note that voicemails left after 4:00 p.m. may not be returned until the following business day.  We are closed weekends and major holidays. You have access to a nurse at all times for urgent questions. Please call the main number to the clinic [phone] and follow the prompts.  For any non-urgent questions, you may also contact your provider using MyChart. We now offer e-Visits for anyone 7 and older to request care online for non-urgent symptoms. For details visit [email or web address].   Also download the MyChart app! Go to the app store, search "MyChart", open the app, select [place], and log in with your MyChart username and password.

## 2024-01-27 NOTE — Progress Notes (Unsigned)
 Hematology and Oncology Follow Up Visit  [name] [id number] July 28, 1960 63 y.o. 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   Today she states that she has been well.  At her last visit she was recovering for a GI bug. She reports that she is back to normal and has not had any significant issues. She does have occasional diarrhea but denies bloody movements or abdominal pains.  Inhibin B level breifly trended up to 113.7 but is back down to 99.9 as of 11/2023 No fever, chills, vomiting, cough, rash, dizziness, SOB, chest pain, palpitations or changes in bowel or bladder habits.  No swelling, tenderness in her extremities.  She has noted numbness and tingling in the hands and feet.  No falls or syncope reported. She has not noted any obvious blood loss. No bruising or petechiae.   ECOG Performance Status: 1 - Symptomatic but completely ambulatory Wt Readings from Last 3 Encounters:  01/27/24 161 lb (73 kg)  01/06/24 161 lb 12.8 oz (73.4 kg)  11/25/23 165 lb 4 oz (75 kg)     Medications:  Allergies as of 01/27/2024       Reactions   Augmentin [amoxicillin -pot Clavulanate] Other (See Comments)   Muscle Aches   Prochlorperazine Edisylate Other (See Comments)   Nervous/ flutter/ shakes   Adhesive [tape] Hives   Redness/hives from adhesive tape( tolerates latex gloves); tolerates paper tape        Medication List        Accurate as of January 27, 2024  1:07 PM. If you have any questions, ask your nurse or doctor.          albuterol  108 (90 Base) MCG/ACT inhaler Commonly known as:  VENTOLIN  HFA TAKE 2 PUFFS BY MOUTH EVERY 6 HOURS AS NEEDED FOR WHEEZE OR SHORTNESS OF BREATH   amoxicillin  500 MG tablet Commonly known as: AMOXIL  Take 2,000 mg by mouth See admin instructions. Take 2,000 mg one hour prior to dental visits.   aspirin  EC 81 MG tablet Take 81 mg by mouth daily.   b complex vitamins capsule Take 1 capsule by mouth 2 (two) times daily.   carvedilol  3.125 MG tablet Commonly known as: COREG  Take 1 tablet (3.125 mg total) by mouth 2 (two) times daily with a meal.   cholecalciferol  25 MCG (1000 UNIT) tablet Commonly known as: VITAMIN D3 Take 4,000 Units by mouth daily.   furosemide  20 MG tablet Commonly known as: LASIX  Take 1 tablet (20 mg total) by mouth daily.   levothyroxine  100 MCG tablet Commonly known as: SYNTHROID  Take 1 tablet (100 mcg total) by mouth daily before breakfast.   lidocaine -prilocaine  cream Commonly known as: EMLA  Apply 1 Application topically as needed. Apply to port per instructions   loperamide  2 MG tablet Commonly known as: IMODIUM  A-D Take 4 mg by mouth 3 (three) times daily as needed for diarrhea or loose stools.   LORazepam  1 MG tablet Commonly known as: ATIVAN  TAKE ONE TABLET BY MOUTH EVERY 8 HOURS AS NEEDED FOR ANXIETY   methocarbamol  500 MG tablet  Commonly known as: ROBAXIN  TAKE ONE TABLET BY MOUTH EVERY 6 HOURS AS NEEDED FOR MUSCLE SPASMS   Potassium Chloride  ER 20 MEQ Tbcr Take 1 tablet (20 mEq total) by mouth daily.   potassium chloride  SA 20 MEQ tablet Commonly known as: KLOR-CON  M Take 20 mEq by mouth daily.   sodium chloride  0.9 % SOLN 50 mL with pembrolizumab  100 MG/4ML SOLN 200 mg Inject 200 mg into the vein every 21 ( twenty-one) days.   spironolactone  25 MG tablet Commonly known as: ALDACTONE  Take 1 tablet (25 mg total) by mouth daily.   traMADol  50 MG tablet Commonly known as: ULTRAM  TAKE ONE TABLET BY MOUTH EVERY 6 HOURS AS NEEDED   Vitamin D  (Ergocalciferol ) 1.25 MG (50000 UNIT) Caps  capsule Commonly known as: DRISDOL  TAKE ONE CAPSULE BY MOUTH every 7 DAYS   warfarin 7.5 MG tablet Commonly known as: COUMADIN  Take as directed by the anticoagulation clinic. If you are unsure how to take this medication, talk to your nurse or doctor. Original instructions: Take 1/2 tablet to 1 tablet by mouth daily as directed by Anticoagulation Clinic.   zolpidem  10 MG tablet Commonly known as: AMBIEN  TAKE ONE TABLET BY MOUTH EVERY DAY        Allergies:  Allergies  Allergen Reactions   Augmentin [Amoxicillin -Pot Clavulanate] Other (See Comments)    Muscle Aches   Prochlorperazine Edisylate Other (See Comments)    Nervous/ flutter/ shakes   Adhesive [Tape] Hives    Redness/hives from adhesive tape( tolerates latex gloves); tolerates paper tape    Past Medical History, Surgical history, Social history, and Family History were reviewed and updated.  Review of Systems: All other 10 point review of systems is negative.   Physical Exam:  height is 5' 6 (1.676 m) and weight is 161 lb (73 kg). Her oral temperature is 98.2 F (36.8 C). Her blood pressure is 110/66 and her pulse is 52 (abnormal). Her respiration is 18 and oxygen saturation is 96%.   Wt Readings from Last 3 Encounters:  01/27/24 161 lb (73 kg)  01/06/24 161 lb 12.8 oz (73.4 kg)  11/25/23 165 lb 4 oz (75 kg)    Ocular: Sclerae unicteric, pupils equal, round and reactive to light Ear-nose-throat: Oropharynx clear, dentition fair Lymphatic: No cervical or supraclavicular adenopathy Lungs no rales or rhonchi, good excursion bilaterally Heart regular rate and rhythm, no murmur appreciated Abd soft, nontender, positive bowel sounds MSK no focal spinal tenderness, no joint edema Neuro: non-focal, well-oriented, appropriate affect   Lab Results  Component Value Date   WBC 7.8 01/27/2024   HGB 11.1 (L) 01/27/2024   HCT 33.6 (L) 01/27/2024   MCV 90.6 01/27/2024   PLT 185 01/27/2024   Lab Results  Component  Value Date   FERRITIN 100 01/06/2024   IRON 50 01/06/2024   TIBC 298 01/06/2024   UIBC 248 01/06/2024   IRONPCTSAT 17 01/06/2024   Lab Results  Component Value Date   RETICCTPCT 1.2 01/06/2024   RBC 3.71 (L) 01/27/2024   No results found for: KPAFRELGTCHN, LAMBDASER, KAPLAMBRATIO No results found for: IGGSERUM, IGA, IGMSERUM No results found for: STEPHANY CARLOTA BENSON MARKEL EARLA JOANNIE DOC VICK, SPEI   Chemistry      Component Value Date/Time   NA 139 01/27/2024 1220   NA 139 08/14/2020 1428   NA 140 03/15/2016 0953   K 3.7 01/27/2024 1220   K 3.9 03/15/2016 0953   CL 102 01/27/2024 1220   CL 102 12/13/2015 0803  CO2 26 01/27/2024 1220   CO2 19 (L) 03/15/2016 0953   BUN 9 01/27/2024 1220   BUN 16 08/14/2020 1428   BUN 15.9 03/15/2016 0953   CREATININE 0.93 01/27/2024 1220   CREATININE 1.4 (H) 03/15/2016 0953      Component Value Date/Time   CALCIUM  8.9 01/27/2024 1220   CALCIUM  9.3 03/15/2016 0953   ALKPHOS 55 01/27/2024 1220   ALKPHOS 55 03/15/2016 0953   AST 16 01/27/2024 1220   AST 17 03/15/2016 0953   ALT 9 01/27/2024 1220   ALT 23 03/15/2016 0953   BILITOT 0.3 01/27/2024 1220   BILITOT 0.38 03/15/2016 0953      Impression and Plan: Ms. Boye is a very pleasant 63 yo caucasian female with recurrent granulosa cell tumor on immunotherapy.   CBC and CMP reviewed today- We will proceed with treatment today as planned. Iron studies are pending. We will replace if needed.  TSH pending today  RTC 3 weeks MD, port labs (CBC w/, CMP, iron, ferritin, Inhibin B), TX  Maude JONELLE Crease, MD 8/5/20251:07 PM

## 2024-01-28 ENCOUNTER — Encounter: Payer: Self-pay | Admitting: Hematology & Oncology

## 2024-01-28 LAB — T4: T4, Total: 8.9 ug/dL (ref 4.5–12.0)

## 2024-01-29 ENCOUNTER — Ambulatory Visit: Payer: Self-pay | Admitting: Hematology & Oncology

## 2024-01-29 LAB — INHIBIN B: Inhibin B: 88.8 pg/mL — ABNORMAL HIGH (ref 0.0–16.9)

## 2024-02-16 ENCOUNTER — Other Ambulatory Visit

## 2024-02-16 ENCOUNTER — Inpatient Hospital Stay

## 2024-02-16 ENCOUNTER — Ambulatory Visit: Admitting: Medical Oncology

## 2024-02-16 ENCOUNTER — Ambulatory Visit: Admitting: Hematology & Oncology

## 2024-02-16 ENCOUNTER — Ambulatory Visit

## 2024-02-19 ENCOUNTER — Inpatient Hospital Stay

## 2024-02-19 ENCOUNTER — Other Ambulatory Visit: Payer: Self-pay | Admitting: Hematology & Oncology

## 2024-02-19 ENCOUNTER — Encounter: Payer: Self-pay | Admitting: Medical Oncology

## 2024-02-19 ENCOUNTER — Inpatient Hospital Stay (HOSPITAL_BASED_OUTPATIENT_CLINIC_OR_DEPARTMENT_OTHER): Admitting: Medical Oncology

## 2024-02-19 VITALS — BP 105/74 | HR 76

## 2024-02-19 VITALS — BP 113/58 | HR 78 | Temp 98.0°F | Resp 17 | Ht 66.0 in | Wt 158.0 lb

## 2024-02-19 DIAGNOSIS — E039 Hypothyroidism, unspecified: Secondary | ICD-10-CM

## 2024-02-19 DIAGNOSIS — T451X5A Adverse effect of antineoplastic and immunosuppressive drugs, initial encounter: Secondary | ICD-10-CM

## 2024-02-19 DIAGNOSIS — D6481 Anemia due to antineoplastic chemotherapy: Secondary | ICD-10-CM

## 2024-02-19 DIAGNOSIS — Z95828 Presence of other vascular implants and grafts: Secondary | ICD-10-CM

## 2024-02-19 DIAGNOSIS — D500 Iron deficiency anemia secondary to blood loss (chronic): Secondary | ICD-10-CM | POA: Diagnosis not present

## 2024-02-19 DIAGNOSIS — D3910 Neoplasm of uncertain behavior of unspecified ovary: Secondary | ICD-10-CM | POA: Diagnosis not present

## 2024-02-19 DIAGNOSIS — N1831 Chronic kidney disease, stage 3a: Secondary | ICD-10-CM | POA: Diagnosis not present

## 2024-02-19 DIAGNOSIS — Z5112 Encounter for antineoplastic immunotherapy: Secondary | ICD-10-CM | POA: Diagnosis not present

## 2024-02-19 DIAGNOSIS — N183 Chronic kidney disease, stage 3 unspecified: Secondary | ICD-10-CM

## 2024-02-19 LAB — IRON AND IRON BINDING CAPACITY (CC-WL,HP ONLY)
Iron: 58 ug/dL (ref 28–170)
Saturation Ratios: 20 % (ref 10.4–31.8)
TIBC: 293 ug/dL (ref 250–450)
UIBC: 235 ug/dL

## 2024-02-19 LAB — RETICULOCYTES
Immature Retic Fract: 6.6 % (ref 2.3–15.9)
RBC.: 3.93 MIL/uL (ref 3.87–5.11)
Retic Count, Absolute: 44.8 K/uL (ref 19.0–186.0)
Retic Ct Pct: 1.1 % (ref 0.4–3.1)

## 2024-02-19 LAB — CBC WITH DIFFERENTIAL (CANCER CENTER ONLY)
Abs Immature Granulocytes: 0.03 K/uL (ref 0.00–0.07)
Basophils Absolute: 0 K/uL (ref 0.0–0.1)
Basophils Relative: 0 %
Eosinophils Absolute: 0.1 K/uL (ref 0.0–0.5)
Eosinophils Relative: 1 %
HCT: 35.7 % — ABNORMAL LOW (ref 36.0–46.0)
Hemoglobin: 12.1 g/dL (ref 12.0–15.0)
Immature Granulocytes: 0 %
Lymphocytes Relative: 7 %
Lymphs Abs: 0.6 K/uL — ABNORMAL LOW (ref 0.7–4.0)
MCH: 30.4 pg (ref 26.0–34.0)
MCHC: 33.9 g/dL (ref 30.0–36.0)
MCV: 89.7 fL (ref 80.0–100.0)
Monocytes Absolute: 0.6 K/uL (ref 0.1–1.0)
Monocytes Relative: 8 %
Neutro Abs: 6.5 K/uL (ref 1.7–7.7)
Neutrophils Relative %: 84 %
Platelet Count: 207 K/uL (ref 150–400)
RBC: 3.98 MIL/uL (ref 3.87–5.11)
RDW: 13.3 % (ref 11.5–15.5)
WBC Count: 7.8 K/uL (ref 4.0–10.5)
nRBC: 0 % (ref 0.0–0.2)

## 2024-02-19 LAB — CMP (CANCER CENTER ONLY)
ALT: 8 U/L (ref 0–44)
AST: 15 U/L (ref 15–41)
Albumin: 3.7 g/dL (ref 3.5–5.0)
Alkaline Phosphatase: 58 U/L (ref 38–126)
Anion gap: 12 (ref 5–15)
BUN: 5 mg/dL — ABNORMAL LOW (ref 8–23)
CO2: 26 mmol/L (ref 22–32)
Calcium: 8.6 mg/dL — ABNORMAL LOW (ref 8.9–10.3)
Chloride: 102 mmol/L (ref 98–111)
Creatinine: 0.79 mg/dL (ref 0.44–1.00)
GFR, Estimated: >60 mL/min (ref >60)
Glucose, Bld: 85 mg/dL (ref 70–99)
Potassium: 3.3 mmol/L — ABNORMAL LOW (ref 3.5–5.1)
Sodium: 140 mmol/L (ref 135–145)
Total Bilirubin: 0.3 mg/dL (ref 0.0–1.2)
Total Protein: 6.2 g/dL — ABNORMAL LOW (ref 6.5–8.1)

## 2024-02-19 LAB — FERRITIN: Ferritin: 61 ng/mL (ref 11–307)

## 2024-02-19 LAB — TSH: TSH: 0.102 u[IU]/mL — ABNORMAL LOW (ref 0.350–4.500)

## 2024-02-19 MED ORDER — SODIUM CHLORIDE 0.9 % IV SOLN: Freq: Once | INTRAVENOUS | Status: AC

## 2024-02-19 MED ORDER — SODIUM CHLORIDE 0.9 % IV SOLN
200.0000 mg | Freq: Once | INTRAVENOUS | Status: AC
  Administered 2024-02-19: 200 mg via INTRAVENOUS
  Filled 2024-02-19: qty 8

## 2024-02-19 NOTE — Progress Notes (Signed)
 Hematology and Oncology Follow Up Visit  [name] [id number] 1960/09/27 63 y.o. 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   Thus far she had been doing well on the pembrolizumab .  So far, she has had no known toxicity from this. She does state occasional bouts of nausea, pain, etc from treatment. She uses her PTO for this from work but is applying for [employer] as her job has voiced some concerns over her absences. At this time she does not want to stop or change TX as her Inhibin B levels are improving.   She has had no problems with her heart.  She is on Coumadin .  She is doing well with Coumadin .  Cardiology is managing the INR.  She has had no leg swelling.  There is no rashes.  She has had no nausea or vomiting.  There is no problems with urine.  She has had no melena or bright red blood per rectum.  Her last Inhibin B level was down to 88  Currently, I would have to say that her performance status is ECOG 1.  Wt Readings from Last 3 Encounters:  02/19/24 158 lb (71.7 kg)  01/27/24 161 lb (73 kg)  01/06/24 161 lb 12.8 oz (73.4 kg)     Medications:  Allergies as of 02/19/2024       Reactions   Augmentin [amoxicillin -pot Clavulanate] Other (See Comments)   Muscle Aches   Prochlorperazine Edisylate Other (See Comments)   Nervous/ flutter/ shakes   Adhesive [tape] Hives   Redness/hives from adhesive tape( tolerates latex gloves); tolerates paper tape        Medication List        Accurate as of February 19, 2024 12:24 PM. If you have any questions, ask your nurse or doctor.           albuterol  108 (90 Base) MCG/ACT inhaler Commonly known as: VENTOLIN  HFA TAKE 2 PUFFS BY MOUTH EVERY 6 HOURS AS NEEDED FOR WHEEZE OR SHORTNESS OF BREATH   amoxicillin  500 MG tablet Commonly known as: AMOXIL  Take 2,000 mg by mouth See admin instructions. Take 2,000 mg one hour prior to dental visits.   aspirin  EC 81 MG tablet Take 81 mg by mouth daily.   b complex vitamins capsule Take 1 capsule by mouth 2 (two) times daily.   carvedilol  3.125 MG tablet Commonly known as: COREG  Take 1 tablet (3.125 mg total) by mouth 2 (two) times daily with a meal.   cholecalciferol  25 MCG (1000 UNIT) tablet Commonly known as: VITAMIN D3 Take 4,000 Units by mouth daily.   furosemide  20 MG tablet Commonly known as: LASIX  Take 1 tablet (20 mg total) by mouth daily.   levothyroxine  100 MCG tablet Commonly known as: SYNTHROID  Take 1 tablet (100 mcg total) by mouth daily before breakfast.   lidocaine -prilocaine  cream Commonly known as: EMLA  Apply 1 Application topically as needed. Apply to port per instructions   loperamide  2 MG tablet Commonly known as: IMODIUM  A-D Take 4 mg by mouth 3 (three) times daily as needed for  diarrhea or loose stools.   LORazepam  1 MG tablet Commonly known as: ATIVAN  TAKE ONE TABLET BY MOUTH EVERY 8 HOURS AS NEEDED FOR ANXIETY   methocarbamol  500 MG tablet Commonly known as: ROBAXIN  TAKE ONE TABLET BY MOUTH EVERY 6 HOURS AS NEEDED FOR MUSCLE SPASMS   Potassium Chloride  ER 20 MEQ Tbcr Take 1 tablet (20 mEq total) by mouth daily.   potassium chloride  SA 20 MEQ tablet Commonly known as: KLOR-CON  M Take 20 mEq by mouth daily.   sodium chloride  0.9 % SOLN 50 mL with pembrolizumab  100 MG/4ML SOLN 200 mg Inject 200 mg into the vein every 21 ( twenty-one) days.   spironolactone  25 MG tablet Commonly known as: ALDACTONE  Take 1 tablet (25 mg total) by mouth daily.   traMADol  50 MG tablet Commonly known as: ULTRAM  TAKE ONE TABLET BY MOUTH EVERY 6 HOURS  AS NEEDED   Vitamin D  (Ergocalciferol ) 1.25 MG (50000 UNIT) Caps capsule Commonly known as: DRISDOL  TAKE ONE CAPSULE BY MOUTH every 7 DAYS   warfarin 7.5 MG tablet Commonly known as: COUMADIN  Take as directed by the anticoagulation clinic. If you are unsure how to take this medication, talk to your nurse or doctor. Original instructions: Take 1/2 tablet to 1 tablet by mouth daily as directed by Anticoagulation Clinic.   zolpidem  10 MG tablet Commonly known as: AMBIEN  TAKE ONE TABLET BY MOUTH EVERY DAY        Allergies:  Allergies  Allergen Reactions   Augmentin [Amoxicillin -Pot Clavulanate] Other (See Comments)    Muscle Aches   Prochlorperazine Edisylate Other (See Comments)    Nervous/ flutter/ shakes   Adhesive [Tape] Hives    Redness/hives from adhesive tape( tolerates latex gloves); tolerates paper tape    Past Medical History, Surgical history, Social history, and Family History were reviewed and updated.  Review of Systems: Review of Systems  Constitutional: Negative.   HENT: Negative.    Eyes: Negative.   Respiratory: Negative.    Cardiovascular: Negative.   Gastrointestinal: Negative.   Genitourinary: Negative.   Musculoskeletal: Negative.   Skin: Negative.   Neurological: Negative.   Endo/Heme/Allergies: Negative.   Psychiatric/Behavioral: Negative.        Physical Exam:  height is 5' 6 (1.676 m) and weight is 158 lb (71.7 kg). Her oral temperature is 98 F (36.7 C). Her blood pressure is 113/58 (abnormal) and her pulse is 78. Her respiration is 17 and oxygen saturation is 99%.   Wt Readings from Last 3 Encounters:  02/19/24 158 lb (71.7 kg)  01/27/24 161 lb (73 kg)  01/06/24 161 lb 12.8 oz (73.4 kg)    Physical Exam Vitals reviewed.  HENT:     Head: Normocephalic and atraumatic.  Eyes:     Pupils: Pupils are equal, round, and reactive to light.  Cardiovascular:     Rate and Rhythm: Normal rate and regular rhythm.     Heart sounds: Normal  heart sounds.  Pulmonary:     Effort: Pulmonary effort is normal.     Breath sounds: Normal breath sounds.  Abdominal:     General: Bowel sounds are normal.     Palpations: Abdomen is soft.  Musculoskeletal:        General: No tenderness or deformity. Normal range of motion.     Cervical back: Normal range of motion.  Lymphadenopathy:     Cervical: No cervical adenopathy.  Skin:    General: Skin is warm and dry.     Findings: No erythema  or rash.  Neurological:     Mental Status: She is alert and oriented to person, place, and time.  Psychiatric:        Behavior: Behavior normal.        Thought Content: Thought content normal.        Judgment: Judgment normal.    Lab Results  Component Value Date   WBC 7.8 02/19/2024   HGB 12.1 02/19/2024   HCT 35.7 (L) 02/19/2024   MCV 89.7 02/19/2024   PLT 207 02/19/2024   Lab Results  Component Value Date   FERRITIN 69 01/27/2024   IRON 59 01/27/2024   TIBC 318 01/27/2024   UIBC 259 01/27/2024   IRONPCTSAT 19 01/27/2024   Lab Results  Component Value Date   RETICCTPCT 1.1 02/19/2024   RBC 3.98 02/19/2024   RBC 3.93 02/19/2024   No results found for: KPAFRELGTCHN, LAMBDASER, KAPLAMBRATIO No results found for: IGGSERUM, IGA, IGMSERUM No results found for: STEPHANY CARLOTA BENSON MARKEL EARLA JOANNIE DOC VICK, SPEI   Chemistry      Component Value Date/Time   NA 139 01/27/2024 1220   NA 139 08/14/2020 1428   NA 140 03/15/2016 0953   K 3.7 01/27/2024 1220   K 3.9 03/15/2016 0953   CL 102 01/27/2024 1220   CL 102 12/13/2015 0803   CO2 26 01/27/2024 1220   CO2 19 (L) 03/15/2016 0953   BUN 9 01/27/2024 1220   BUN 16 08/14/2020 1428   BUN 15.9 03/15/2016 0953   CREATININE 0.93 01/27/2024 1220   CREATININE 1.4 (H) 03/15/2016 0953      Component Value Date/Time   CALCIUM  8.9 01/27/2024 1220   CALCIUM  9.3 03/15/2016 0953   ALKPHOS 55 01/27/2024 1220   ALKPHOS 55 03/15/2016 0953    AST 16 01/27/2024 1220   AST 17 03/15/2016 0953   ALT 9 01/27/2024 1220   ALT 23 03/15/2016 0953   BILITOT 0.3 01/27/2024 1220   BILITOT 0.38 03/15/2016 0953      Impression and Plan: Ms. Barga is a very pleasant 63 yo caucasian female with recurrent granulosa cell tumor.  She is on Keytruda .  She actually has done incredibly well with the Keytruda .  I know that her Inhibin B level has been on the higher side but at least is holding steady.  She really has had no symptoms from this.  Inhibin B levels continue to fall  She wishes to hold off on imaging and hold off of treatment change or holiday at this time Completing FMLA paperwork.  Iron levels pending.  CBC and CMP reviewed with patient  TX today RTC 3 weeks MD, port labs, Infusion   Lauraine CHRISTELLA Dais, PA-C 8/28/202512:24 PM

## 2024-02-19 NOTE — Patient Instructions (Signed)
 [facility] - A DEPT OF [facility]  Discharge Instructions: Thank you for choosing [facility] to provide your oncology and hematology care.   If you have a lab appointment with the Cancer Center, please go directly to the Cancer Center and check in at the registration area.  Wear comfortable clothing and clothing appropriate for easy access to any Portacath or PICC line.   We strive to give you quality time with your provider. You may need to reschedule your appointment if you arrive late (15 or more minutes).  Arriving late affects you and other patients whose appointments are after yours.  Also, if you miss three or more appointments without notifying the office, you may be dismissed from the clinic at the provider's discretion.      For prescription refill requests, have your pharmacy contact our office and allow 72 hours for refills to be completed.    Today you received the following chemotherapy and/or immunotherapy agents Keytruda       To help prevent nausea and vomiting after your treatment, we encourage you to take your nausea medication as directed.  BELOW ARE SYMPTOMS THAT SHOULD BE REPORTED IMMEDIATELY: *FEVER GREATER THAN 100.4 F (38 C) OR HIGHER *CHILLS OR SWEATING *NAUSEA AND VOMITING THAT IS NOT CONTROLLED WITH YOUR NAUSEA MEDICATION *UNUSUAL SHORTNESS OF BREATH *UNUSUAL BRUISING OR BLEEDING *URINARY PROBLEMS (pain or burning when urinating, or frequent urination) *BOWEL PROBLEMS (unusual diarrhea, constipation, pain near the anus) TENDERNESS IN MOUTH AND THROAT WITH OR WITHOUT PRESENCE OF ULCERS (sore throat, sores in mouth, or a toothache) UNUSUAL RASH, SWELLING OR PAIN  UNUSUAL VAGINAL DISCHARGE OR ITCHING   Items with * indicate a potential emergency and should be followed up as soon as possible or go to the Emergency Department if any problems should occur.  Please show the CHEMOTHERAPY ALERT CARD or IMMUNOTHERAPY  ALERT CARD at check-in to the Emergency Department and triage nurse. Should you have questions after your visit or need to cancel or reschedule your appointment, please contact [facility] - A DEPT OF [facility]  [phone] and follow the prompts.  Office hours are 8:00 a.m. to 4:30 p.m. Monday - Friday. Please note that voicemails left after 4:00 p.m. may not be returned until the following business day.  We are closed weekends and major holidays. You have access to a nurse at all times for urgent questions. Please call the main number to the clinic [phone] and follow the prompts.  For any non-urgent questions, you may also contact your provider using MyChart. We now offer e-Visits for anyone 7 and older to request care online for non-urgent symptoms. For details visit [email or web address].   Also download the MyChart app! Go to the app store, search "MyChart", open the app, select [place], and log in with your MyChart username and password.

## 2024-02-20 LAB — T4: T4, Total: 8.9 ug/dL (ref 4.5–12.0)

## 2024-02-25 LAB — INHIBIN B: Inhibin B: 115.1 pg/mL — ABNORMAL HIGH (ref 0.0–16.9)

## 2024-02-27 ENCOUNTER — Ambulatory Visit

## 2024-03-08 ENCOUNTER — Telehealth: Payer: Self-pay

## 2024-03-08 ENCOUNTER — Other Ambulatory Visit: Payer: Self-pay | Admitting: Hematology & Oncology

## 2024-03-08 NOTE — Telephone Encounter (Signed)
 LVM for pt regarding her FMLA form being completed,faxed, and confirmation received. No questions or concerns to be noted at this time.

## 2024-03-09 ENCOUNTER — Inpatient Hospital Stay

## 2024-03-09 ENCOUNTER — Ambulatory Visit

## 2024-03-09 ENCOUNTER — Other Ambulatory Visit

## 2024-03-09 ENCOUNTER — Ambulatory Visit: Admitting: Hematology & Oncology

## 2024-03-09 ENCOUNTER — Inpatient Hospital Stay: Admitting: Medical Oncology

## 2024-03-10 ENCOUNTER — Ambulatory Visit: Attending: Cardiology

## 2024-03-10 DIAGNOSIS — Z952 Presence of prosthetic heart valve: Secondary | ICD-10-CM

## 2024-03-10 DIAGNOSIS — I480 Paroxysmal atrial fibrillation: Secondary | ICD-10-CM

## 2024-03-10 DIAGNOSIS — Z953 Presence of xenogenic heart valve: Secondary | ICD-10-CM

## 2024-03-10 LAB — POCT INR: INR: 2.1 (ref 2.0–3.0)

## 2024-03-10 NOTE — Patient Instructions (Signed)
 Description   INR 2.1, NO PRINTOUT. Continue taking warfarin 1/2 tablet daily except for 1 tablet on Tuesdays, Thursdays, and Saturdays.  Recheck INR in 6 weeks.  Coumadin  Clinic [phone]

## 2024-03-10 NOTE — Progress Notes (Signed)
 Description   INR 2.1, NO PRINTOUT. Continue taking warfarin 1/2 tablet daily except for 1 tablet on Tuesdays, Thursdays, and Saturdays.  Recheck INR in 6 weeks.  Coumadin  Clinic [phone]

## 2024-03-11 ENCOUNTER — Inpatient Hospital Stay (HOSPITAL_BASED_OUTPATIENT_CLINIC_OR_DEPARTMENT_OTHER): Admitting: Medical Oncology

## 2024-03-11 ENCOUNTER — Inpatient Hospital Stay

## 2024-03-11 ENCOUNTER — Inpatient Hospital Stay: Attending: Hematology & Oncology

## 2024-03-11 ENCOUNTER — Ambulatory Visit

## 2024-03-11 ENCOUNTER — Encounter: Payer: Self-pay | Admitting: Medical Oncology

## 2024-03-11 VITALS — BP 101/65 | HR 70

## 2024-03-11 VITALS — BP 88/53 | HR 73 | Temp 98.2°F | Resp 18 | Ht 66.0 in | Wt 151.1 lb

## 2024-03-11 DIAGNOSIS — D3910 Neoplasm of uncertain behavior of unspecified ovary: Secondary | ICD-10-CM

## 2024-03-11 DIAGNOSIS — K909 Intestinal malabsorption, unspecified: Secondary | ICD-10-CM | POA: Diagnosis not present

## 2024-03-11 DIAGNOSIS — Z5112 Encounter for antineoplastic immunotherapy: Secondary | ICD-10-CM

## 2024-03-11 DIAGNOSIS — E039 Hypothyroidism, unspecified: Secondary | ICD-10-CM

## 2024-03-11 DIAGNOSIS — R7989 Other specified abnormal findings of blood chemistry: Secondary | ICD-10-CM | POA: Diagnosis not present

## 2024-03-11 DIAGNOSIS — R634 Abnormal weight loss: Secondary | ICD-10-CM

## 2024-03-11 DIAGNOSIS — C569 Malignant neoplasm of unspecified ovary: Secondary | ICD-10-CM | POA: Insufficient documentation

## 2024-03-11 DIAGNOSIS — R110 Nausea: Secondary | ICD-10-CM | POA: Insufficient documentation

## 2024-03-11 DIAGNOSIS — D500 Iron deficiency anemia secondary to blood loss (chronic): Secondary | ICD-10-CM

## 2024-03-11 DIAGNOSIS — Z7901 Long term (current) use of anticoagulants: Secondary | ICD-10-CM | POA: Insufficient documentation

## 2024-03-11 DIAGNOSIS — Z79620 Long term (current) use of immunosuppressive biologic: Secondary | ICD-10-CM | POA: Insufficient documentation

## 2024-03-11 DIAGNOSIS — Z95828 Presence of other vascular implants and grafts: Secondary | ICD-10-CM

## 2024-03-11 DIAGNOSIS — N1831 Chronic kidney disease, stage 3a: Secondary | ICD-10-CM

## 2024-03-11 DIAGNOSIS — D508 Other iron deficiency anemias: Secondary | ICD-10-CM | POA: Insufficient documentation

## 2024-03-11 DIAGNOSIS — N183 Chronic kidney disease, stage 3 unspecified: Secondary | ICD-10-CM

## 2024-03-11 DIAGNOSIS — Z952 Presence of prosthetic heart valve: Secondary | ICD-10-CM | POA: Diagnosis not present

## 2024-03-11 DIAGNOSIS — T451X5A Adverse effect of antineoplastic and immunosuppressive drugs, initial encounter: Secondary | ICD-10-CM

## 2024-03-11 LAB — CBC WITH DIFFERENTIAL (CANCER CENTER ONLY)
Abs Immature Granulocytes: 0.05 K/uL (ref 0.00–0.07)
Basophils Absolute: 0 K/uL (ref 0.0–0.1)
Basophils Relative: 0 %
Eosinophils Absolute: 0.1 K/uL (ref 0.0–0.5)
Eosinophils Relative: 1 %
HCT: 34.4 % — ABNORMAL LOW (ref 36.0–46.0)
Hemoglobin: 11.6 g/dL — ABNORMAL LOW (ref 12.0–15.0)
Immature Granulocytes: 1 %
Lymphocytes Relative: 6 %
Lymphs Abs: 0.5 K/uL — ABNORMAL LOW (ref 0.7–4.0)
MCH: 30.2 pg (ref 26.0–34.0)
MCHC: 33.7 g/dL (ref 30.0–36.0)
MCV: 89.6 fL (ref 80.0–100.0)
Monocytes Absolute: 0.7 K/uL (ref 0.1–1.0)
Monocytes Relative: 9 %
Neutro Abs: 6.8 K/uL (ref 1.7–7.7)
Neutrophils Relative %: 83 %
Platelet Count: 227 K/uL (ref 150–400)
RBC: 3.84 MIL/uL — ABNORMAL LOW (ref 3.87–5.11)
RDW: 12.9 % (ref 11.5–15.5)
WBC Count: 8.1 K/uL (ref 4.0–10.5)
nRBC: 0 % (ref 0.0–0.2)

## 2024-03-11 LAB — CMP (CANCER CENTER ONLY)
ALT: 16 U/L (ref 0–44)
AST: 20 U/L (ref 15–41)
Albumin: 3.8 g/dL (ref 3.5–5.0)
Alkaline Phosphatase: 51 U/L (ref 38–126)
Anion gap: 12 (ref 5–15)
BUN: 8 mg/dL (ref 8–23)
CO2: 24 mmol/L (ref 22–32)
Calcium: 8.8 mg/dL — ABNORMAL LOW (ref 8.9–10.3)
Chloride: 102 mmol/L (ref 98–111)
Creatinine: 0.98 mg/dL (ref 0.44–1.00)
GFR, Estimated: >60 mL/min (ref >60)
Glucose, Bld: 110 mg/dL — ABNORMAL HIGH (ref 70–99)
Potassium: 3.4 mmol/L — ABNORMAL LOW (ref 3.5–5.1)
Sodium: 137 mmol/L (ref 135–145)
Total Bilirubin: 0.3 mg/dL (ref 0.0–1.2)
Total Protein: 6.2 g/dL — ABNORMAL LOW (ref 6.5–8.1)

## 2024-03-11 LAB — IRON AND IRON BINDING CAPACITY (CC-WL,HP ONLY)
Iron: 35 ug/dL (ref 28–170)
Saturation Ratios: 12 % (ref 10.4–31.8)
TIBC: 297 ug/dL (ref 250–450)
UIBC: 262 ug/dL

## 2024-03-11 LAB — RETIC PANEL
Immature Retic Fract: 10.7 % (ref 2.3–15.9)
RBC.: 3.83 MIL/uL — ABNORMAL LOW (ref 3.87–5.11)
Retic Count, Absolute: 54.8 K/uL (ref 19.0–186.0)
Retic Ct Pct: 1.4 % (ref 0.4–3.1)
Reticulocyte Hemoglobin: 36.3 pg (ref >27.9)

## 2024-03-11 LAB — FERRITIN: Ferritin: 65 ng/mL (ref 11–307)

## 2024-03-11 LAB — TSH: TSH: 0.413 u[IU]/mL (ref 0.350–4.500)

## 2024-03-11 MED ORDER — POTASSIUM CHLORIDE 10 MEQ/100ML IV SOLN
10.0000 meq | Freq: Once | INTRAVENOUS | Status: AC
  Administered 2024-03-11: 10 meq via INTRAVENOUS
  Filled 2024-03-11: qty 100

## 2024-03-11 MED ORDER — SODIUM CHLORIDE 0.9 % IV SOLN: Freq: Once | INTRAVENOUS | Status: AC

## 2024-03-11 MED ORDER — SODIUM CHLORIDE 0.9 % IV SOLN
200.0000 mg | Freq: Once | INTRAVENOUS | Status: AC
  Administered 2024-03-11: 200 mg via INTRAVENOUS
  Filled 2024-03-11: qty 8

## 2024-03-11 NOTE — Patient Instructions (Signed)
 [facility] - A DEPT OF [facility]  Discharge Instructions: Thank you for choosing [facility] to provide your oncology and hematology care.   If you have a lab appointment with the Cancer Center, please go directly to the Cancer Center and check in at the registration area.  Wear comfortable clothing and clothing appropriate for easy access to any Portacath or PICC line.   We strive to give you quality time with your provider. You may need to reschedule your appointment if you arrive late (15 or more minutes).  Arriving late affects you and other patients whose appointments are after yours.  Also, if you miss three or more appointments without notifying the office, you may be dismissed from the clinic at the provider's discretion.      For prescription refill requests, have your pharmacy contact our office and allow 72 hours for refills to be completed.    Today you received the following chemotherapy and/or immunotherapy agents Keytruda       To help prevent nausea and vomiting after your treatment, we encourage you to take your nausea medication as directed.  BELOW ARE SYMPTOMS THAT SHOULD BE REPORTED IMMEDIATELY: *FEVER GREATER THAN 100.4 F (38 C) OR HIGHER *CHILLS OR SWEATING *NAUSEA AND VOMITING THAT IS NOT CONTROLLED WITH YOUR NAUSEA MEDICATION *UNUSUAL SHORTNESS OF BREATH *UNUSUAL BRUISING OR BLEEDING *URINARY PROBLEMS (pain or burning when urinating, or frequent urination) *BOWEL PROBLEMS (unusual diarrhea, constipation, pain near the anus) TENDERNESS IN MOUTH AND THROAT WITH OR WITHOUT PRESENCE OF ULCERS (sore throat, sores in mouth, or a toothache) UNUSUAL RASH, SWELLING OR PAIN  UNUSUAL VAGINAL DISCHARGE OR ITCHING   Items with * indicate a potential emergency and should be followed up as soon as possible or go to the Emergency Department if any problems should occur.  Please show the CHEMOTHERAPY ALERT CARD or IMMUNOTHERAPY  ALERT CARD at check-in to the Emergency Department and triage nurse. Should you have questions after your visit or need to cancel or reschedule your appointment, please contact [facility] - A DEPT OF [facility]  [phone] and follow the prompts.  Office hours are 8:00 a.m. to 4:30 p.m. Monday - Friday. Please note that voicemails left after 4:00 p.m. may not be returned until the following business day.  We are closed weekends and major holidays. You have access to a nurse at all times for urgent questions. Please call the main number to the clinic [phone] and follow the prompts.  For any non-urgent questions, you may also contact your provider using MyChart. We now offer e-Visits for anyone 7 and older to request care online for non-urgent symptoms. For details visit [email or web address].   Also download the MyChart app! Go to the app store, search "MyChart", open the app, select [place], and log in with your MyChart username and password.

## 2024-03-11 NOTE — Progress Notes (Signed)
 Hematology and Oncology Follow Up Visit  [name] [id number] Dec 31, 1960 63 y.o. 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   Thus far she had been doing well on the pembrolizumab .  So far, she has had no known toxicity from this. She does state occasional bouts of nausea, pain, etc from treatment. She uses her PTO for this from work but is applying for [employer] as her job has voiced some concerns over her absences. At this time she does not want to stop or change TX as her Inhibin B levels are improving.   She has had no problems with her heart.  She is on Coumadin .  She is doing well with Coumadin .  Cardiology is managing the INR.  She has had no leg swelling.  There is no rashes.  She has had no nausea or vomiting.  There is no problems with urine.  She has had no melena or bright red blood per rectum.  Her last Inhibin B level trended up to 115.1 from 88.8 the month prior.   Her weight has trended down by 10 pounds in 1 month. She does report some GI upset earlier in the week. She feels better and wishes to proceed forward with treatment. No fevers, constipation, bloody movements.   Currently, I would have to say that her performance status is ECOG 1.  Wt Readings from Last 3 Encounters:  03/11/24 151 lb 1.9 oz (68.5 kg)  02/19/24 158 lb (71.7 kg)  01/27/24 161 lb (73 kg)     Medications:  Allergies as of 03/11/2024       Reactions   Augmentin [amoxicillin -pot Clavulanate] Other (See Comments)   Muscle Aches   Prochlorperazine Edisylate Other (See Comments)   Nervous/ flutter/ shakes    Adhesive [tape] Hives   Redness/hives from adhesive tape( tolerates latex gloves); tolerates paper tape        Medication List        Accurate as of March 11, 2024 12:03 PM. If you have any questions, ask your nurse or doctor.          albuterol  108 (90 Base) MCG/ACT inhaler Commonly known as: VENTOLIN  HFA TAKE 2 PUFFS BY MOUTH EVERY 6 HOURS AS NEEDED FOR WHEEZE OR SHORTNESS OF BREATH   amoxicillin  500 MG tablet Commonly known as: AMOXIL  Take 2,000 mg by mouth See admin instructions. Take 2,000 mg one hour prior to dental visits.   aspirin  EC 81 MG tablet Take 81 mg by mouth daily.   b complex vitamins capsule Take 1 capsule by mouth 2 (two) times daily.   carvedilol  3.125 MG tablet Commonly known as: COREG  Take 1 tablet (3.125 mg total) by mouth 2 (two) times daily with a meal.   cholecalciferol  25 MCG (1000 UNIT) tablet Commonly known as: VITAMIN D3 Take 4,000 Units by mouth daily.   furosemide  20 MG tablet Commonly known as: LASIX  Take 1 tablet (20 mg total) by mouth daily.   levothyroxine  100 MCG tablet Commonly known as: SYNTHROID  Take 1 tablet (100 mcg total) by mouth daily  before breakfast.   lidocaine -prilocaine  cream Commonly known as: EMLA  Apply 1 Application topically as needed. Apply to port per instructions   loperamide  2 MG tablet Commonly known as: IMODIUM  A-D Take 4 mg by mouth 3 (three) times daily as needed for diarrhea or loose stools.   LORazepam  1 MG tablet Commonly known as: ATIVAN  TAKE ONE TABLET BY MOUTH EVERY 8 HOURS AS NEEDED FOR ANXIETY   methocarbamol  500 MG tablet Commonly known as: ROBAXIN  TAKE ONE TABLET BY MOUTH EVERY 6 HOURS AS NEEDED FOR MUSCLE SPASMS   Potassium Chloride  ER 20 MEQ Tbcr Take 1 tablet (20 mEq total) by mouth daily.   potassium chloride  SA 20 MEQ tablet Commonly known as: KLOR-CON  M Take 20 mEq by mouth daily.   sodium chloride  0.9 % SOLN 50 mL with pembrolizumab  100 MG/4ML SOLN 200 mg Inject  200 mg into the vein every 21 ( twenty-one) days.   spironolactone  25 MG tablet Commonly known as: ALDACTONE  Take 1 tablet (25 mg total) by mouth daily.   traMADol  50 MG tablet Commonly known as: ULTRAM  TAKE ONE TABLET BY MOUTH EVERY 6 HOURS AS NEEDED   Vitamin D  (Ergocalciferol ) 1.25 MG (50000 UNIT) Caps capsule Commonly known as: DRISDOL  TAKE ONE CAPSULE BY MOUTH every 7 DAYS   warfarin 7.5 MG tablet Commonly known as: COUMADIN  Take as directed by the anticoagulation clinic. If you are unsure how to take this medication, talk to your nurse or doctor. Original instructions: Take 1/2 tablet to 1 tablet by mouth daily as directed by Anticoagulation Clinic.   zolpidem  10 MG tablet Commonly known as: AMBIEN  TAKE ONE TABLET BY MOUTH EVERY DAY        Allergies:  Allergies  Allergen Reactions   Augmentin [Amoxicillin -Pot Clavulanate] Other (See Comments)    Muscle Aches   Prochlorperazine Edisylate Other (See Comments)    Nervous/ flutter/ shakes   Adhesive [Tape] Hives    Redness/hives from adhesive tape( tolerates latex gloves); tolerates paper tape    Past Medical History, Surgical history, Social history, and Family History were reviewed and updated.  Review of Systems: Review of Systems  Constitutional: Negative.   HENT: Negative.    Eyes: Negative.   Respiratory: Negative.    Cardiovascular: Negative.   Gastrointestinal: Negative.   Genitourinary: Negative.   Musculoskeletal: Negative.   Skin: Negative.   Neurological: Negative.   Endo/Heme/Allergies: Negative.   Psychiatric/Behavioral: Negative.        Physical Exam:  height is 5' 6 (1.676 m) and weight is 151 lb 1.9 oz (68.5 kg). Her oral temperature is 98.2 F (36.8 C). Her blood pressure is 88/53 (abnormal) and her pulse is 73. Her respiration is 18 and oxygen saturation is 99%.   Wt Readings from Last 3 Encounters:  03/11/24 151 lb 1.9 oz (68.5 kg)  02/19/24 158 lb (71.7 kg)  01/27/24 161 lb (73  kg)    Physical Exam Vitals reviewed.  HENT:     Head: Normocephalic and atraumatic.  Eyes:     Pupils: Pupils are equal, round, and reactive to light.  Cardiovascular:     Rate and Rhythm: Normal rate and regular rhythm.     Heart sounds: Normal heart sounds.  Pulmonary:     Effort: Pulmonary effort is normal.     Breath sounds: Normal breath sounds.  Abdominal:     General: Bowel sounds are normal.     Palpations: Abdomen is soft.  Musculoskeletal:        General:  No tenderness or deformity. Normal range of motion.     Cervical back: Normal range of motion.  Lymphadenopathy:     Cervical: No cervical adenopathy.  Skin:    General: Skin is warm and dry.     Findings: No erythema or rash.  Neurological:     Mental Status: She is alert and oriented to person, place, and time.  Psychiatric:        Behavior: Behavior normal.        Thought Content: Thought content normal.        Judgment: Judgment normal.    Lab Results  Component Value Date   WBC 8.1 03/11/2024   HGB 11.6 (L) 03/11/2024   HCT 34.4 (L) 03/11/2024   MCV 89.6 03/11/2024   PLT 227 03/11/2024   Lab Results  Component Value Date   FERRITIN 61 02/19/2024   IRON 58 02/19/2024   TIBC 293 02/19/2024   UIBC 235 02/19/2024   IRONPCTSAT 20 02/19/2024   Lab Results  Component Value Date   RETICCTPCT 1.4 03/11/2024   RBC 3.84 (L) 03/11/2024   RBC 3.83 (L) 03/11/2024   No results found for: KPAFRELGTCHN, LAMBDASER, KAPLAMBRATIO No results found for: KIMBERLY LE, IGMSERUM No results found for: STEPHANY CARLOTA BENSON MARKEL EARLA JOANNIE DOC VICK, SPEI   Chemistry      Component Value Date/Time   NA 137 03/11/2024 1114   NA 139 08/14/2020 1428   NA 140 03/15/2016 0953   K 3.4 (L) 03/11/2024 1114   K 3.9 03/15/2016 0953   CL 102 03/11/2024 1114   CL 102 12/13/2015 0803   CO2 24 03/11/2024 1114   CO2 19 (L) 03/15/2016 0953   BUN 8 03/11/2024 1114   BUN 16  08/14/2020 1428   BUN 15.9 03/15/2016 0953   CREATININE 0.98 03/11/2024 1114   CREATININE 1.4 (H) 03/15/2016 0953      Component Value Date/Time   CALCIUM  8.8 (L) 03/11/2024 1114   CALCIUM  9.3 03/15/2016 0953   ALKPHOS 51 03/11/2024 1114   ALKPHOS 55 03/15/2016 0953   AST 20 03/11/2024 1114   AST 17 03/15/2016 0953   ALT 16 03/11/2024 1114   ALT 23 03/15/2016 0953   BILITOT 0.3 03/11/2024 1114   BILITOT 0.38 03/15/2016 0953     Encounter Diagnoses  Name Primary?   Granulosa cell tumor of ovary, unspecified laterality Yes   Unintentional weight loss    High serum inhibin B    Encounter for antineoplastic immunotherapy     Impression and Plan: Ms. Julio is a very pleasant 63 yo caucasian female with recurrent granulosa cell tumor.  She is on Keytruda .  She actually has done incredibly well with the Keytruda .  I know that her Inhibin B level has been on the higher side but at least is holding steady.  She really has had no symptoms from this.  Inhibin B level is up a bit along with unintentional weight loss- CT scan recommended. Per patient she wishes to wait until after October 1st for this scan due to work conflicts.  BP is a bit soft- though normal for patient- will give IVF (500 ml given her CHF history)ahead of Keytruda  as well as IV K for her mild hypokalemia.  Iron levels pending.  CBC and CMP reviewed with patient  TX today RTC 3 weeks MD, port labs, Infusion   Lauraine CHRISTELLA Dais, PA-C 9/18/202512:03 PM

## 2024-03-11 NOTE — Patient Instructions (Signed)

## 2024-03-12 ENCOUNTER — Other Ambulatory Visit: Payer: Self-pay | Admitting: Hematology & Oncology

## 2024-03-12 DIAGNOSIS — D3910 Neoplasm of uncertain behavior of unspecified ovary: Secondary | ICD-10-CM

## 2024-03-12 LAB — T4: T4, Total: 8.7 ug/dL (ref 4.5–12.0)

## 2024-03-16 ENCOUNTER — Telehealth: Payer: Self-pay | Admitting: Dietician

## 2024-03-16 ENCOUNTER — Ambulatory Visit: Payer: Self-pay | Admitting: Medical Oncology

## 2024-03-16 LAB — INHIBIN B: Inhibin B: 114.3 pg/mL — ABNORMAL HIGH (ref 0.0–16.9)

## 2024-03-16 NOTE — Telephone Encounter (Signed)
 Patient screened on MST. First attempt to reach. Provided my cell# on voice mail to return call to set up a nutrition consult.  Micheline Craven, RDN, LDN Registered Dietitian, [facility] Part Time Remote (Usual office hours: Tuesday-Thursday) Cell: [phone]

## 2024-03-17 ENCOUNTER — Telehealth: Payer: Self-pay | Admitting: Hematology & Oncology

## 2024-03-17 NOTE — Telephone Encounter (Signed)
 Called to achedule infusion per inbasket. LVM to return call for scheduling.

## 2024-03-19 ENCOUNTER — Other Ambulatory Visit: Payer: Self-pay | Admitting: Hematology & Oncology

## 2024-03-19 DIAGNOSIS — D3910 Neoplasm of uncertain behavior of unspecified ovary: Secondary | ICD-10-CM

## 2024-03-23 ENCOUNTER — Inpatient Hospital Stay

## 2024-03-23 VITALS — BP 101/66 | HR 79 | Temp 98.2°F | Resp 18

## 2024-03-23 DIAGNOSIS — Z5112 Encounter for antineoplastic immunotherapy: Secondary | ICD-10-CM | POA: Diagnosis not present

## 2024-03-23 DIAGNOSIS — T451X5A Adverse effect of antineoplastic and immunosuppressive drugs, initial encounter: Secondary | ICD-10-CM

## 2024-03-23 MED ORDER — SODIUM CHLORIDE 0.9 % IV SOLN
125.0000 mg | Freq: Once | INTRAVENOUS | Status: AC
  Administered 2024-03-23: 125 mg via INTRAVENOUS
  Filled 2024-03-23: qty 10

## 2024-03-23 MED ORDER — SODIUM CHLORIDE 0.9 % IV SOLN: Freq: Once | INTRAVENOUS | Status: AC

## 2024-03-23 NOTE — Patient Instructions (Signed)
Sodium Ferric Gluconate Complex Injection What is this medication? SODIUM FERRIC GLUCONATE COMPLEX (SOE dee um FER ik GLOO koe nate KOM pleks) treats low levels of iron (iron deficiency anemia) in people with kidney disease. Iron is a mineral that plays an important role in making red blood cells, which carry oxygen from your lungs to the rest of your body. This medicine may be used for other purposes; ask your health care provider or pharmacist if you have questions. COMMON BRAND NAME(S): Ferrlecit, Nulecit What should I tell my care team before I take this medication? They need to know if you have any of the following conditions: Anemia that is not from iron deficiency High levels of iron in the blood An unusual or allergic reaction to iron, other medications, foods, dyes, or preservatives Pregnant or are trying to become pregnant Breast-feeding How should I use this medication? This medication is injected into a vein. It is given by your care team in a hospital or clinic setting. Talk to your care team about the use of this medication in children. While it may be prescribed for children as young as 6 years for selected conditions, precautions do apply. Overdosage: If you think you have taken too much of this medicine contact a poison control center or emergency room at once. NOTE: This medicine is only for you. Do not share this medicine with others. What if I miss a dose? It is important not to miss your dose. Call your care team if you are unable to keep an appointment. What may interact with this medication? Do not take this medication with any of the following: Deferasirox Deferoxamine Dimercaprol This medication may also interact with the following: Other iron products This list may not describe all possible interactions. Give your health care provider a list of all the medicines, herbs, non-prescription drugs, or dietary supplements you use. Also tell them if you smoke, drink  alcohol, or use illegal drugs. Some items may interact with your medicine. What should I watch for while using this medication? Your condition will be monitored carefully while you are receiving this medication. Visit your care team for regular checks on your progress. You may need blood work while you are taking this medication. What side effects may I notice from receiving this medication? Side effects that you should report to your care team as soon as possible: Allergic reactions--skin rash, itching, hives, swelling of the face, lips, tongue, or throat Low blood pressure--dizziness, feeling faint or lightheaded, blurry vision Shortness of breath Side effects that usually do not require medical attention (report to your care team if they continue or are bothersome): Flushing Headache Joint pain Muscle pain Nausea Pain, redness, or irritation at injection site This list may not describe all possible side effects. Call your doctor for medical advice about side effects. You may report side effects to FDA at 1-800-FDA-1088. Where should I keep my medication? This medication is given in a hospital or clinic and will not be stored at home. NOTE: This sheet is a summary. It may not cover all possible information. If you have questions about this medicine, talk to your doctor, pharmacist, or health care provider.  2024 Elsevier/Gold Standard (2020-11-03 00:00:00)

## 2024-03-25 ENCOUNTER — Other Ambulatory Visit: Payer: Self-pay | Admitting: Hematology & Oncology

## 2024-03-25 DIAGNOSIS — F419 Anxiety disorder, unspecified: Secondary | ICD-10-CM

## 2024-03-30 ENCOUNTER — Telehealth: Payer: Self-pay

## 2024-03-30 ENCOUNTER — Ambulatory Visit (INDEPENDENT_AMBULATORY_CARE_PROVIDER_SITE_OTHER): Payer: 59

## 2024-03-30 DIAGNOSIS — I480 Paroxysmal atrial fibrillation: Secondary | ICD-10-CM | POA: Diagnosis not present

## 2024-03-30 NOTE — Telephone Encounter (Signed)
 LVM for pt in regards to her disability form being completed,faxed, and confirmation received. No questions or concerns to be noted at this time.

## 2024-03-31 ENCOUNTER — Telehealth: Payer: Self-pay

## 2024-03-31 LAB — CUP PACEART REMOTE DEVICE CHECK
Battery Remaining Longevity: 67 mo
Battery Remaining Percentage: 64 %
Battery Voltage: 2.98 V
Brady Statistic AP VP Percent: 1 %
Brady Statistic AP VS Percent: 4.4 %
Brady Statistic AS VP Percent: 1 %
Brady Statistic AS VS Percent: 92 %
Brady Statistic RA Percent Paced: 1 %
Brady Statistic RV Percent Paced: 1 %
Date Time Interrogation Session: 20251008102800
HighPow Impedance: 63 Ohm
Implantable Lead Connection Status: 753985
Implantable Lead Connection Status: 753985
Implantable Lead Implant Date: 20110503
Implantable Lead Implant Date: 20130815
Implantable Lead Location: 753859
Implantable Lead Location: 753860
Implantable Pulse Generator Implant Date: 20211012
Lead Channel Impedance Value: 250 Ohm
Lead Channel Impedance Value: 380 Ohm
Lead Channel Pacing Threshold Amplitude: 0.5 V
Lead Channel Pacing Threshold Amplitude: 2.125 V
Lead Channel Pacing Threshold Pulse Width: 0.5 ms
Lead Channel Pacing Threshold Pulse Width: 1 ms
Lead Channel Sensing Intrinsic Amplitude: 1.9 mV
Lead Channel Sensing Intrinsic Amplitude: 12 mV
Lead Channel Setting Pacing Amplitude: 2 V
Lead Channel Setting Pacing Amplitude: 2.375
Lead Channel Setting Pacing Pulse Width: 1 ms
Lead Channel Setting Sensing Sensitivity: 0.5 mV
Pulse Gen Serial Number: 111028151
Zone Setting Status: 755011

## 2024-03-31 NOTE — Telephone Encounter (Signed)
 Pt returned my phone call in regards to her disability forms. I emailed the pt her copy. No questions or concerns to be noted at this time.

## 2024-04-01 ENCOUNTER — Ambulatory Visit: Payer: Self-pay | Admitting: Internal Medicine

## 2024-04-01 ENCOUNTER — Inpatient Hospital Stay

## 2024-04-01 ENCOUNTER — Inpatient Hospital Stay (HOSPITAL_BASED_OUTPATIENT_CLINIC_OR_DEPARTMENT_OTHER): Admitting: Hematology & Oncology

## 2024-04-01 ENCOUNTER — Inpatient Hospital Stay: Attending: Hematology & Oncology

## 2024-04-01 ENCOUNTER — Encounter: Payer: Self-pay | Admitting: Hematology & Oncology

## 2024-04-01 VITALS — BP 97/62 | HR 80 | Temp 98.0°F | Resp 18 | Wt 152.5 lb

## 2024-04-01 VITALS — BP 96/60 | HR 70

## 2024-04-01 DIAGNOSIS — Z7901 Long term (current) use of anticoagulants: Secondary | ICD-10-CM | POA: Insufficient documentation

## 2024-04-01 DIAGNOSIS — Z952 Presence of prosthetic heart valve: Secondary | ICD-10-CM | POA: Insufficient documentation

## 2024-04-01 DIAGNOSIS — D3910 Neoplasm of uncertain behavior of unspecified ovary: Secondary | ICD-10-CM

## 2024-04-01 DIAGNOSIS — R7989 Other specified abnormal findings of blood chemistry: Secondary | ICD-10-CM

## 2024-04-01 DIAGNOSIS — R110 Nausea: Secondary | ICD-10-CM | POA: Insufficient documentation

## 2024-04-01 DIAGNOSIS — Z5112 Encounter for antineoplastic immunotherapy: Secondary | ICD-10-CM | POA: Insufficient documentation

## 2024-04-01 DIAGNOSIS — R1030 Lower abdominal pain, unspecified: Secondary | ICD-10-CM

## 2024-04-01 DIAGNOSIS — C569 Malignant neoplasm of unspecified ovary: Secondary | ICD-10-CM | POA: Insufficient documentation

## 2024-04-01 DIAGNOSIS — R634 Abnormal weight loss: Secondary | ICD-10-CM

## 2024-04-01 DIAGNOSIS — Z79620 Long term (current) use of immunosuppressive biologic: Secondary | ICD-10-CM | POA: Diagnosis not present

## 2024-04-01 DIAGNOSIS — Z79811 Long term (current) use of aromatase inhibitors: Secondary | ICD-10-CM | POA: Diagnosis not present

## 2024-04-01 LAB — URINALYSIS, COMPLETE (UACMP) WITH MICROSCOPIC
Bilirubin Urine: NEGATIVE
Glucose, UA: NEGATIVE mg/dL
Hgb urine dipstick: NEGATIVE
Ketones, ur: NEGATIVE mg/dL
Nitrite: NEGATIVE
Protein, ur: NEGATIVE mg/dL
Specific Gravity, Urine: 1.015 (ref 1.005–1.030)
pH: 6 (ref 5.0–8.0)

## 2024-04-01 LAB — CMP (CANCER CENTER ONLY)
ALT: 10 U/L (ref 0–44)
AST: 15 U/L (ref 15–41)
Albumin: 3.9 g/dL (ref 3.5–5.0)
Alkaline Phosphatase: 58 U/L (ref 38–126)
Anion gap: 13 (ref 5–15)
BUN: 10 mg/dL (ref 8–23)
CO2: 24 mmol/L (ref 22–32)
Calcium: 9 mg/dL (ref 8.9–10.3)
Chloride: 101 mmol/L (ref 98–111)
Creatinine: 1 mg/dL (ref 0.44–1.00)
GFR, Estimated: >60 mL/min (ref >60)
Glucose, Bld: 100 mg/dL — ABNORMAL HIGH (ref 70–99)
Potassium: 3.4 mmol/L — ABNORMAL LOW (ref 3.5–5.1)
Sodium: 138 mmol/L (ref 135–145)
Total Bilirubin: 0.4 mg/dL (ref 0.0–1.2)
Total Protein: 6.6 g/dL (ref 6.5–8.1)

## 2024-04-01 LAB — IRON AND IRON BINDING CAPACITY (CC-WL,HP ONLY)
Iron: 55 ug/dL (ref 28–170)
Saturation Ratios: 19 % (ref 10.4–31.8)
TIBC: 284 ug/dL (ref 250–450)
UIBC: 229 ug/dL

## 2024-04-01 LAB — CBC WITH DIFFERENTIAL (CANCER CENTER ONLY)
Abs Immature Granulocytes: 0.07 K/uL (ref 0.00–0.07)
Basophils Absolute: 0 K/uL (ref 0.0–0.1)
Basophils Relative: 0 %
Eosinophils Absolute: 0.1 K/uL (ref 0.0–0.5)
Eosinophils Relative: 1 %
HCT: 35.8 % — ABNORMAL LOW (ref 36.0–46.0)
Hemoglobin: 12 g/dL (ref 12.0–15.0)
Immature Granulocytes: 1 %
Lymphocytes Relative: 6 %
Lymphs Abs: 0.6 K/uL — ABNORMAL LOW (ref 0.7–4.0)
MCH: 30.3 pg (ref 26.0–34.0)
MCHC: 33.5 g/dL (ref 30.0–36.0)
MCV: 90.4 fL (ref 80.0–100.0)
Monocytes Absolute: 0.8 K/uL (ref 0.1–1.0)
Monocytes Relative: 8 %
Neutro Abs: 8 K/uL — ABNORMAL HIGH (ref 1.7–7.7)
Neutrophils Relative %: 84 %
Platelet Count: 198 K/uL (ref 150–400)
RBC: 3.96 MIL/uL (ref 3.87–5.11)
RDW: 13.1 % (ref 11.5–15.5)
WBC Count: 9.5 K/uL (ref 4.0–10.5)
nRBC: 0 % (ref 0.0–0.2)

## 2024-04-01 LAB — TSH: TSH: <0.1 u[IU]/mL — ABNORMAL LOW (ref 0.350–4.500)

## 2024-04-01 LAB — RETIC PANEL
Immature Retic Fract: 6.7 % (ref 2.3–15.9)
RBC.: 3.96 MIL/uL (ref 3.87–5.11)
Retic Count, Absolute: 53.1 K/uL (ref 19.0–186.0)
Retic Ct Pct: 1.3 % (ref 0.4–3.1)
Reticulocyte Hemoglobin: 34.6 pg (ref >27.9)

## 2024-04-01 LAB — FERRITIN: Ferritin: 143 ng/mL (ref 11–307)

## 2024-04-01 MED ORDER — SODIUM CHLORIDE 0.9 % IV SOLN: Freq: Once | INTRAVENOUS | Status: AC

## 2024-04-01 MED ORDER — SODIUM CHLORIDE 0.9 % IV SOLN
200.0000 mg | Freq: Once | INTRAVENOUS | Status: AC
  Administered 2024-04-01: 200 mg via INTRAVENOUS
  Filled 2024-04-01: qty 8

## 2024-04-01 NOTE — Progress Notes (Signed)
 Hematology and Oncology Follow Up Visit  [name] [id number] Oct 04, 1960 63 y.o. 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  [name] has been having some abdominal issues.  [name] has been having some abdominal discomfort.  I do not know if this is irritable bowel.  [name] has had to take time off of work.  [name] is taking 3 months off to try to feel better.  [name] feels nauseated.  [name] thought this is from the immunotherapy.  I am I said that we really not seeing immunotherapy do this with the majority of our patients.  I will go ahead and get a urine culture and urinalysis on [name].  [name] is due for a CT scan tomorrow.  [name] last Inhibin B level was 114.SABRA  [name] has had no issues with fever.  There is been no bleeding.  [name] has had no leg swelling.  [name] continues on Coumadin .  [name] has a mechanical heart valve.  [name] is being followed by cardiology for this.  Currently, I would say that [name] performance status is probably ECOG 1.   Wt Readings from Last 3 Encounters:  04/01/24 152 lb 8 oz (69.2 kg)  03/11/24 151 lb 1.9 oz (68.5 kg)  02/19/24 158 lb (71.7 kg)     Medications:  Allergies as of 04/01/2024       Reactions   Augmentin [amoxicillin -pot Clavulanate] Other (See Comments)   Muscle Aches   Prochlorperazine Edisylate Other (See Comments)   Nervous/ flutter/ shakes   Adhesive [tape] Hives   Redness/hives from adhesive tape( tolerates latex gloves); tolerates paper tape        Medication List        Accurate as of April 01, 2024 11:04 AM. If you have any questions, ask your nurse or  doctor.          albuterol  108 (90 Base) MCG/ACT inhaler Commonly known as: VENTOLIN  HFA TAKE 2 PUFFS BY MOUTH EVERY 6 HOURS AS NEEDED FOR WHEEZE OR SHORTNESS OF BREATH   amoxicillin  500 MG tablet Commonly known as: AMOXIL  Take 2,000 mg by mouth See admin instructions. Take 2,000 mg one hour prior to dental visits.   aspirin  EC 81 MG tablet Take 81 mg by mouth daily.   b complex vitamins capsule Take 1 capsule by mouth 2 (two) times daily.   carvedilol  3.125 MG tablet Commonly known as: COREG  Take 1 tablet (3.125 mg total) by mouth 2 (two) times daily with a meal.   cholecalciferol  25 MCG (1000 UNIT) tablet Commonly known as: VITAMIN D3 Take 4,000 Units by mouth daily.   furosemide  20 MG tablet Commonly known as: LASIX  Take 1 tablet (20 mg total) by mouth daily.   levothyroxine  100 MCG tablet Commonly known as: SYNTHROID  Take 1 tablet (100 mcg total) by mouth daily before breakfast.   lidocaine -prilocaine  cream Commonly known as: EMLA  Apply 1 Application topically as needed. Apply to port per instructions   loperamide  2 MG tablet Commonly known as: IMODIUM  A-D Take 4 mg by mouth 3 (three) times daily as needed  for diarrhea or loose stools.   LORazepam  1 MG tablet Commonly known as: ATIVAN  TAKE ONE TABLET BY MOUTH EVERY 8 HOURS AS NEEDED FOR ANXIETY   methocarbamol  500 MG tablet Commonly known as: ROBAXIN  TAKE ONE TABLET BY MOUTH EVERY 6 HOURS AS NEEDED FOR MUSCLE SPASMS   Potassium Chloride  ER 20 MEQ Tbcr Take 1 tablet (20 mEq total) by mouth daily.   potassium chloride  SA 20 MEQ tablet Commonly known as: KLOR-CON  M Take 20 mEq by mouth daily.   sodium chloride  0.9 % SOLN 50 mL with pembrolizumab  100 MG/4ML SOLN 200 mg Inject 200 mg into the vein every 21 ( twenty-one) days.   spironolactone  25 MG tablet Commonly known as: ALDACTONE  Take 1 tablet (25 mg total) by mouth daily.   traMADol  50 MG tablet Commonly known as: ULTRAM  TAKE ONE TABLET BY MOUTH  EVERY 6 HOURS AS NEEDED   Vitamin D  (Ergocalciferol ) 1.25 MG (50000 UNIT) Caps capsule Commonly known as: DRISDOL  TAKE ONE CAPSULE BY MOUTH every 7 DAYS   warfarin 7.5 MG tablet Commonly known as: COUMADIN  Take as directed by the anticoagulation clinic. If you are unsure how to take this medication, talk to your nurse or doctor. Original instructions: Take 1/2 tablet to 1 tablet by mouth daily as directed by Anticoagulation Clinic.   zolpidem  10 MG tablet Commonly known as: AMBIEN  TAKE ONE TABLET BY MOUTH EVERY DAY        Allergies:  Allergies  Allergen Reactions   Augmentin [Amoxicillin -Pot Clavulanate] Other (See Comments)    Muscle Aches   Prochlorperazine Edisylate Other (See Comments)    Nervous/ flutter/ shakes   Adhesive [Tape] Hives    Redness/hives from adhesive tape( tolerates latex gloves); tolerates paper tape    Past Medical History, Surgical history, Social history, and Family History were reviewed and updated.  Review of Systems: Review of Systems  Constitutional: Negative.   HENT: Negative.    Eyes: Negative.   Respiratory: Negative.    Cardiovascular: Negative.   Gastrointestinal: Negative.   Genitourinary: Negative.   Musculoskeletal: Negative.   Skin: Negative.   Neurological: Negative.   Endo/Heme/Allergies: Negative.   Psychiatric/Behavioral: Negative.        Physical Exam:  weight is 152 lb 8 oz (69.2 kg). [name] oral temperature is 98 F (36.7 C). [name] blood pressure is 97/62 and [name] pulse is 80. [name] respiration is 18 and oxygen saturation is 100%.   Wt Readings from Last 3 Encounters:  04/01/24 152 lb 8 oz (69.2 kg)  03/11/24 151 lb 1.9 oz (68.5 kg)  02/19/24 158 lb (71.7 kg)    Physical Exam Vitals reviewed.  HENT:     Head: Normocephalic and atraumatic.  Eyes:     Pupils: Pupils are equal, round, and reactive to light.  Cardiovascular:     Rate and Rhythm: Normal rate and regular rhythm.     Heart sounds: Normal heart sounds.   Pulmonary:     Effort: Pulmonary effort is normal.     Breath sounds: Normal breath sounds.  Abdominal:     General: Bowel sounds are normal.     Palpations: Abdomen is soft.  Musculoskeletal:        General: No tenderness or deformity. Normal range of motion.     Cervical back: Normal range of motion.  Lymphadenopathy:     Cervical: No cervical adenopathy.  Skin:    General: Skin is warm and dry.     Findings: No erythema or rash.  Neurological:     Mental Status: [name] is alert and oriented to person, place, and time.  Psychiatric:        Behavior: Behavior normal.        Thought Content: Thought content normal.        Judgment: Judgment normal.    Lab Results  Component Value Date   WBC 9.5 04/01/2024   HGB 12.0 04/01/2024   HCT 35.8 (L) 04/01/2024   MCV 90.4 04/01/2024   PLT 198 04/01/2024   Lab Results  Component Value Date   FERRITIN 65 03/11/2024   IRON 35 03/11/2024   TIBC 297 03/11/2024   UIBC 262 03/11/2024   IRONPCTSAT 12 03/11/2024   Lab Results  Component Value Date   RETICCTPCT 1.3 04/01/2024   RBC 3.96 04/01/2024   No results found for: KPAFRELGTCHN, LAMBDASER, KAPLAMBRATIO No results found for: KIMBERLY LE, IGMSERUM No results found for: STEPHANY CARLOTA BENSON MARKEL EARLA JOANNIE DOC VICK, SPEI   Chemistry      Component Value Date/Time   NA 137 03/11/2024 1114   NA 139 08/14/2020 1428   NA 140 03/15/2016 0953   K 3.4 (L) 03/11/2024 1114   K 3.9 03/15/2016 0953   CL 102 03/11/2024 1114   CL 102 12/13/2015 0803   CO2 24 03/11/2024 1114   CO2 19 (L) 03/15/2016 0953   BUN 8 03/11/2024 1114   BUN 16 08/14/2020 1428   BUN 15.9 03/15/2016 0953   CREATININE 0.98 03/11/2024 1114   CREATININE 1.4 (H) 03/15/2016 0953      Component Value Date/Time   CALCIUM  8.8 (L) 03/11/2024 1114   CALCIUM  9.3 03/15/2016 0953   ALKPHOS 51 03/11/2024 1114   ALKPHOS 55 03/15/2016 0953   AST 20 03/11/2024 1114    AST 17 03/15/2016 0953   ALT 16 03/11/2024 1114   ALT 23 03/15/2016 0953   BILITOT 0.3 03/11/2024 1114   BILITOT 0.38 03/15/2016 0953      Impression and Plan: Ms. Sandall is a very pleasant 63 yo caucasian female with recurrent granulosa cell tumor.  [name] is on Keytruda .  [name] actually has done incredibly well with the Keytruda .    I am still not sure why [name] has the abdominal issues.  Again, the CT scan will help us .  We will see what the urinalysis and urine culture shows.  For right now, we will have [name] plan to come back in another month.  We will see what the CT scan shows.  Hopefully, we will not see that [name] has progressive disease.      Maude JONELLE Crease, MD 10/9/202511:04 AM

## 2024-04-01 NOTE — Patient Instructions (Signed)

## 2024-04-01 NOTE — Patient Instructions (Signed)
 [facility] - A DEPT OF [facility]  Discharge Instructions: Thank you for choosing [facility] to provide your oncology and hematology care.   If you have a lab appointment with the Cancer Center, please go directly to the Cancer Center and check in at the registration area.  Wear comfortable clothing and clothing appropriate for easy access to any Portacath or PICC line.   We strive to give you quality time with your provider. You may need to reschedule your appointment if you arrive late (15 or more minutes).  Arriving late affects you and other patients whose appointments are after yours.  Also, if you miss three or more appointments without notifying the office, you may be dismissed from the clinic at the provider's discretion.      For prescription refill requests, have your pharmacy contact our office and allow 72 hours for refills to be completed.    Today you received the following chemotherapy and/or immunotherapy agents Keytruda       To help prevent nausea and vomiting after your treatment, we encourage you to take your nausea medication as directed.  BELOW ARE SYMPTOMS THAT SHOULD BE REPORTED IMMEDIATELY: *FEVER GREATER THAN 100.4 F (38 C) OR HIGHER *CHILLS OR SWEATING *NAUSEA AND VOMITING THAT IS NOT CONTROLLED WITH YOUR NAUSEA MEDICATION *UNUSUAL SHORTNESS OF BREATH *UNUSUAL BRUISING OR BLEEDING *URINARY PROBLEMS (pain or burning when urinating, or frequent urination) *BOWEL PROBLEMS (unusual diarrhea, constipation, pain near the anus) TENDERNESS IN MOUTH AND THROAT WITH OR WITHOUT PRESENCE OF ULCERS (sore throat, sores in mouth, or a toothache) UNUSUAL RASH, SWELLING OR PAIN  UNUSUAL VAGINAL DISCHARGE OR ITCHING   Items with * indicate a potential emergency and should be followed up as soon as possible or go to the Emergency Department if any problems should occur.  Please show the CHEMOTHERAPY ALERT CARD or IMMUNOTHERAPY  ALERT CARD at check-in to the Emergency Department and triage nurse. Should you have questions after your visit or need to cancel or reschedule your appointment, please contact [facility] - A DEPT OF [facility]  [phone] and follow the prompts.  Office hours are 8:00 a.m. to 4:30 p.m. Monday - Friday. Please note that voicemails left after 4:00 p.m. may not be returned until the following business day.  We are closed weekends and major holidays. You have access to a nurse at all times for urgent questions. Please call the main number to the clinic [phone] and follow the prompts.  For any non-urgent questions, you may also contact your provider using MyChart. We now offer e-Visits for anyone 7 and older to request care online for non-urgent symptoms. For details visit [email or web address].   Also download the MyChart app! Go to the app store, search "MyChart", open the app, select [place], and log in with your MyChart username and password.

## 2024-04-02 ENCOUNTER — Ambulatory Visit (HOSPITAL_BASED_OUTPATIENT_CLINIC_OR_DEPARTMENT_OTHER)
Admission: RE | Admit: 2024-04-02 | Discharge: 2024-04-02 | Disposition: A | Discharge status: Home / Self Care | Source: Ambulatory Visit | Attending: Medical Oncology | Admitting: Medical Oncology

## 2024-04-02 ENCOUNTER — Inpatient Hospital Stay

## 2024-04-02 DIAGNOSIS — D3910 Neoplasm of uncertain behavior of unspecified ovary: Secondary | ICD-10-CM | POA: Diagnosis present

## 2024-04-02 DIAGNOSIS — R634 Abnormal weight loss: Secondary | ICD-10-CM | POA: Diagnosis present

## 2024-04-02 DIAGNOSIS — R7989 Other specified abnormal findings of blood chemistry: Secondary | ICD-10-CM | POA: Insufficient documentation

## 2024-04-02 DIAGNOSIS — Z5112 Encounter for antineoplastic immunotherapy: Secondary | ICD-10-CM | POA: Diagnosis not present

## 2024-04-02 LAB — T4: T4, Total: 9.4 ug/dL (ref 4.5–12.0)

## 2024-04-02 MED ORDER — IOHEXOL 300 MG/ML  SOLN
100.0000 mL | Freq: Once | INTRAMUSCULAR | Status: AC | PRN
  Administered 2024-04-02: 100 mL via INTRAVENOUS

## 2024-04-02 NOTE — Progress Notes (Signed)
 Remote ICD Transmission

## 2024-04-02 NOTE — Patient Instructions (Signed)

## 2024-04-03 LAB — URINE CULTURE: Culture: NO GROWTH

## 2024-04-06 ENCOUNTER — Ambulatory Visit: Payer: Self-pay | Admitting: Hematology & Oncology

## 2024-04-06 LAB — INHIBIN B: Inhibin B: 128 pg/mL — ABNORMAL HIGH (ref 0.0–16.9)

## 2024-04-08 ENCOUNTER — Other Ambulatory Visit: Payer: Self-pay | Admitting: Hematology & Oncology

## 2024-04-08 ENCOUNTER — Telehealth: Payer: Self-pay | Admitting: *Deleted

## 2024-04-08 DIAGNOSIS — D3910 Neoplasm of uncertain behavior of unspecified ovary: Secondary | ICD-10-CM

## 2024-04-08 NOTE — Telephone Encounter (Signed)
 TONETTE KOEHNE, [phone], calling about the [employer] form recently returned.  It did not have a return to work date,  The section No. 5. Progress reads improved.  Why does it say that?  [employer] is using my appointment of 04/01/2024 as the return to work date.  Correct these and use 06/14/2024 as the return to work date.  E-mail to me to return to [employer].  Confirmed email address as [email or web address]

## 2024-04-13 ENCOUNTER — Telehealth: Payer: Self-pay

## 2024-04-13 ENCOUNTER — Telehealth: Payer: Self-pay | Admitting: *Deleted

## 2024-04-13 NOTE — Telephone Encounter (Signed)
 Received a call from Nathanel stating that her [facility] ST disability paperwork was incomplete and requested Section 9 be completed with return date of 12.22.25.  Also requested to check reduced hours as TBD.  Form as well as well as requested information emailed to [facility] Forms

## 2024-04-13 NOTE — Telephone Encounter (Signed)
 Pt had a cocern about her [employer] form question #9 was missing a return to work date. Form was updated and emailed to pt upon request. Pt verbalized understanding.

## 2024-04-19 ENCOUNTER — Other Ambulatory Visit: Payer: Self-pay | Admitting: Hematology & Oncology

## 2024-04-19 DIAGNOSIS — D3910 Neoplasm of uncertain behavior of unspecified ovary: Secondary | ICD-10-CM

## 2024-04-21 ENCOUNTER — Ambulatory Visit: Attending: Cardiovascular Disease | Admitting: Pharmacist

## 2024-04-21 DIAGNOSIS — Z952 Presence of prosthetic heart valve: Secondary | ICD-10-CM | POA: Diagnosis not present

## 2024-04-21 DIAGNOSIS — I480 Paroxysmal atrial fibrillation: Secondary | ICD-10-CM | POA: Diagnosis not present

## 2024-04-21 DIAGNOSIS — Z953 Presence of xenogenic heart valve: Secondary | ICD-10-CM | POA: Diagnosis not present

## 2024-04-21 LAB — POCT INR: INR: 1.6 — AB (ref 2.0–3.0)

## 2024-04-21 NOTE — Patient Instructions (Signed)
 Description   INR 2.1, NO PRINTOUT. Take 1 whole tablet today and then continue taking warfarin 1/2 tablet daily except for 1 tablet on Tuesdays, Thursdays, and Saturdays.  Recheck INR in 3 weeks.  Coumadin  Clinic [phone]

## 2024-04-21 NOTE — Progress Notes (Signed)
 Description   INR 2.1, NO PRINTOUT. Take 1 whole tablet today and then continue taking warfarin 1/2 tablet daily except for 1 tablet on Tuesdays, Thursdays, and Saturdays.  Recheck INR in 3 weeks.  Coumadin  Clinic [phone]

## 2024-04-22 ENCOUNTER — Other Ambulatory Visit: Payer: Self-pay | Admitting: Hematology & Oncology

## 2024-04-22 ENCOUNTER — Other Ambulatory Visit: Payer: Self-pay | Admitting: Cardiovascular Disease

## 2024-04-22 ENCOUNTER — Ambulatory Visit: Admitting: Hematology & Oncology

## 2024-04-22 ENCOUNTER — Inpatient Hospital Stay

## 2024-04-22 ENCOUNTER — Other Ambulatory Visit

## 2024-04-22 DIAGNOSIS — I480 Paroxysmal atrial fibrillation: Secondary | ICD-10-CM

## 2024-05-03 ENCOUNTER — Other Ambulatory Visit: Payer: Self-pay | Admitting: Hematology & Oncology

## 2024-05-03 DIAGNOSIS — D3910 Neoplasm of uncertain behavior of unspecified ovary: Secondary | ICD-10-CM

## 2024-05-06 ENCOUNTER — Inpatient Hospital Stay

## 2024-05-06 ENCOUNTER — Other Ambulatory Visit: Payer: Self-pay

## 2024-05-06 ENCOUNTER — Encounter: Payer: Self-pay | Admitting: Hematology & Oncology

## 2024-05-06 ENCOUNTER — Inpatient Hospital Stay: Attending: Hematology & Oncology

## 2024-05-06 ENCOUNTER — Ambulatory Visit: Payer: Self-pay | Admitting: Hematology & Oncology

## 2024-05-06 ENCOUNTER — Inpatient Hospital Stay: Admitting: Hematology & Oncology

## 2024-05-06 VITALS — BP 102/63 | HR 84 | Temp 97.9°F | Resp 18 | Ht 66.0 in | Wt 154.0 lb

## 2024-05-06 VITALS — BP 100/63 | HR 71 | Resp 17

## 2024-05-06 DIAGNOSIS — C569 Malignant neoplasm of unspecified ovary: Secondary | ICD-10-CM | POA: Diagnosis present

## 2024-05-06 DIAGNOSIS — E039 Hypothyroidism, unspecified: Secondary | ICD-10-CM

## 2024-05-06 DIAGNOSIS — F419 Anxiety disorder, unspecified: Secondary | ICD-10-CM

## 2024-05-06 DIAGNOSIS — Z95828 Presence of other vascular implants and grafts: Secondary | ICD-10-CM

## 2024-05-06 DIAGNOSIS — Z5112 Encounter for antineoplastic immunotherapy: Secondary | ICD-10-CM

## 2024-05-06 DIAGNOSIS — R978 Other abnormal tumor markers: Secondary | ICD-10-CM | POA: Diagnosis not present

## 2024-05-06 DIAGNOSIS — D3910 Neoplasm of uncertain behavior of unspecified ovary: Secondary | ICD-10-CM

## 2024-05-06 DIAGNOSIS — N183 Chronic kidney disease, stage 3 unspecified: Secondary | ICD-10-CM

## 2024-05-06 DIAGNOSIS — Z79620 Long term (current) use of immunosuppressive biologic: Secondary | ICD-10-CM | POA: Insufficient documentation

## 2024-05-06 DIAGNOSIS — D509 Iron deficiency anemia, unspecified: Secondary | ICD-10-CM | POA: Insufficient documentation

## 2024-05-06 DIAGNOSIS — Z79811 Long term (current) use of aromatase inhibitors: Secondary | ICD-10-CM | POA: Diagnosis not present

## 2024-05-06 DIAGNOSIS — R634 Abnormal weight loss: Secondary | ICD-10-CM

## 2024-05-06 DIAGNOSIS — D500 Iron deficiency anemia secondary to blood loss (chronic): Secondary | ICD-10-CM

## 2024-05-06 DIAGNOSIS — D6481 Anemia due to antineoplastic chemotherapy: Secondary | ICD-10-CM

## 2024-05-06 DIAGNOSIS — R1030 Lower abdominal pain, unspecified: Secondary | ICD-10-CM

## 2024-05-06 DIAGNOSIS — Z7901 Long term (current) use of anticoagulants: Secondary | ICD-10-CM | POA: Diagnosis not present

## 2024-05-06 DIAGNOSIS — R7989 Other specified abnormal findings of blood chemistry: Secondary | ICD-10-CM

## 2024-05-06 DIAGNOSIS — N390 Urinary tract infection, site not specified: Secondary | ICD-10-CM | POA: Insufficient documentation

## 2024-05-06 DIAGNOSIS — Z953 Presence of xenogenic heart valve: Secondary | ICD-10-CM

## 2024-05-06 DIAGNOSIS — N1831 Chronic kidney disease, stage 3a: Secondary | ICD-10-CM

## 2024-05-06 LAB — CBC WITH DIFFERENTIAL (CANCER CENTER ONLY)
Abs Immature Granulocytes: 0.05 K/uL (ref 0.00–0.07)
Basophils Absolute: 0.1 K/uL (ref 0.0–0.1)
Basophils Relative: 1 %
Eosinophils Absolute: 0.2 K/uL (ref 0.0–0.5)
Eosinophils Relative: 2 %
HCT: 34.6 % — ABNORMAL LOW (ref 36.0–46.0)
Hemoglobin: 11.5 g/dL — ABNORMAL LOW (ref 12.0–15.0)
Immature Granulocytes: 1 %
Lymphocytes Relative: 5 %
Lymphs Abs: 0.5 K/uL — ABNORMAL LOW (ref 0.7–4.0)
MCH: 30.3 pg (ref 26.0–34.0)
MCHC: 33.2 g/dL (ref 30.0–36.0)
MCV: 91.1 fL (ref 80.0–100.0)
Monocytes Absolute: 0.9 K/uL (ref 0.1–1.0)
Monocytes Relative: 8 %
Neutro Abs: 9.1 K/uL — ABNORMAL HIGH (ref 1.7–7.7)
Neutrophils Relative %: 83 %
Platelet Count: 220 K/uL (ref 150–400)
RBC: 3.8 MIL/uL — ABNORMAL LOW (ref 3.87–5.11)
RDW: 13.5 % (ref 11.5–15.5)
WBC Count: 10.7 K/uL — ABNORMAL HIGH (ref 4.0–10.5)
nRBC: 0 % (ref 0.0–0.2)

## 2024-05-06 LAB — IRON AND IRON BINDING CAPACITY (CC-WL,HP ONLY)
Iron: 59 ug/dL (ref 28–170)
Saturation Ratios: 20 % (ref 10.4–31.8)
TIBC: 295 ug/dL (ref 250–450)
UIBC: 236 ug/dL

## 2024-05-06 LAB — URINALYSIS, COMPLETE (UACMP) WITH MICROSCOPIC
Bilirubin Urine: NEGATIVE
Glucose, UA: NEGATIVE mg/dL
Ketones, ur: NEGATIVE mg/dL
Nitrite: NEGATIVE
Protein, ur: NEGATIVE mg/dL
Specific Gravity, Urine: 1.015 (ref 1.005–1.030)
pH: 6.5 (ref 5.0–8.0)

## 2024-05-06 LAB — CMP (CANCER CENTER ONLY)
ALT: 11 U/L (ref 0–44)
AST: 16 U/L (ref 15–41)
Albumin: 3.9 g/dL (ref 3.5–5.0)
Alkaline Phosphatase: 59 U/L (ref 38–126)
Anion gap: 13 (ref 5–15)
BUN: 18 mg/dL (ref 8–23)
CO2: 25 mmol/L (ref 22–32)
Calcium: 9.1 mg/dL (ref 8.9–10.3)
Chloride: 102 mmol/L (ref 98–111)
Creatinine: 1.05 mg/dL — ABNORMAL HIGH (ref 0.44–1.00)
GFR, Estimated: 59 mL/min — ABNORMAL LOW (ref >60)
Glucose, Bld: 84 mg/dL (ref 70–99)
Potassium: 3.8 mmol/L (ref 3.5–5.1)
Sodium: 140 mmol/L (ref 135–145)
Total Bilirubin: 0.3 mg/dL (ref 0.0–1.2)
Total Protein: 6.5 g/dL (ref 6.5–8.1)

## 2024-05-06 LAB — RETICULOCYTES
Immature Retic Fract: 10.9 % (ref 2.3–15.9)
RBC.: 3.79 MIL/uL — ABNORMAL LOW (ref 3.87–5.11)
Retic Count, Absolute: 58.7 K/uL (ref 19.0–186.0)
Retic Ct Pct: 1.6 % (ref 0.4–3.1)

## 2024-05-06 LAB — FERRITIN: Ferritin: 100 ng/mL (ref 11–307)

## 2024-05-06 LAB — TSH: TSH: 0.136 u[IU]/mL — ABNORMAL LOW (ref 0.350–4.500)

## 2024-05-06 MED ORDER — SODIUM CHLORIDE 0.9 % IV SOLN
200.0000 mg | Freq: Once | INTRAVENOUS | Status: AC
  Administered 2024-05-06: 200 mg via INTRAVENOUS
  Filled 2024-05-06: qty 8

## 2024-05-06 MED ORDER — SODIUM CHLORIDE 0.9 % IV SOLN: Freq: Once | INTRAVENOUS | Status: AC

## 2024-05-06 NOTE — Patient Instructions (Signed)

## 2024-05-06 NOTE — Patient Instructions (Signed)
 [facility] - A DEPT OF [facility]  Discharge Instructions: Thank you for choosing [facility] to provide your oncology and hematology care.   If you have a lab appointment with the Cancer Center, please go directly to the Cancer Center and check in at the registration area.  Wear comfortable clothing and clothing appropriate for easy access to any Portacath or PICC line.   We strive to give you quality time with your provider. You may need to reschedule your appointment if you arrive late (15 or more minutes).  Arriving late affects you and other patients whose appointments are after yours.  Also, if you miss three or more appointments without notifying the office, you may be dismissed from the clinic at the provider's discretion.      For prescription refill requests, have your pharmacy contact our office and allow 72 hours for refills to be completed.    Today you received the following chemotherapy and/or immunotherapy agents Keytruda       To help prevent nausea and vomiting after your treatment, we encourage you to take your nausea medication as directed.  BELOW ARE SYMPTOMS THAT SHOULD BE REPORTED IMMEDIATELY: *FEVER GREATER THAN 100.4 F (38 C) OR HIGHER *CHILLS OR SWEATING *NAUSEA AND VOMITING THAT IS NOT CONTROLLED WITH YOUR NAUSEA MEDICATION *UNUSUAL SHORTNESS OF BREATH *UNUSUAL BRUISING OR BLEEDING *URINARY PROBLEMS (pain or burning when urinating, or frequent urination) *BOWEL PROBLEMS (unusual diarrhea, constipation, pain near the anus) TENDERNESS IN MOUTH AND THROAT WITH OR WITHOUT PRESENCE OF ULCERS (sore throat, sores in mouth, or a toothache) UNUSUAL RASH, SWELLING OR PAIN  UNUSUAL VAGINAL DISCHARGE OR ITCHING   Items with * indicate a potential emergency and should be followed up as soon as possible or go to the Emergency Department if any problems should occur.  Please show the CHEMOTHERAPY ALERT CARD or IMMUNOTHERAPY  ALERT CARD at check-in to the Emergency Department and triage nurse. Should you have questions after your visit or need to cancel or reschedule your appointment, please contact [facility] - A DEPT OF [facility]  [phone] and follow the prompts.  Office hours are 8:00 a.m. to 4:30 p.m. Monday - Friday. Please note that voicemails left after 4:00 p.m. may not be returned until the following business day.  We are closed weekends and major holidays. You have access to a nurse at all times for urgent questions. Please call the main number to the clinic [phone] and follow the prompts.  For any non-urgent questions, you may also contact your provider using MyChart. We now offer e-Visits for anyone 7 and older to request care online for non-urgent symptoms. For details visit [email or web address].   Also download the MyChart app! Go to the app store, search "MyChart", open the app, select [place], and log in with your MyChart username and password.

## 2024-05-06 NOTE — Progress Notes (Signed)
 Hematology and Oncology Follow Up Visit  [name] [id number] August 08, 1960 63 y.o. 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  She is actually doing pretty well.  She really has had no complaints.  When we last saw her, we did do a CT scan.  This was done on 04/02/2024.  The CT scan did seem to suggest that there was some slight enlargement of her middle malignancy.  However, down in the pelvis, everything looked relatively stable.  Her last Inhibin B was 128.  This does tend to fluctuate.  She is having no abdominal pain.  She is having no cough or shortness of breath.  She is on Coumadin  for her cardiac valve.  She is doing well with this.  Cardiology is managing this..  She says that she may need to have her urine checked.  She has had no nausea or vomiting.  She has had no leg swelling.  She has had no rashes.  She has had no bleeding.  Overall, I would say that her performance status is probably ECOG 1.   Wt Readings from Last 3 Encounters:  05/06/24 154 lb (69.9 kg)  04/01/24 152 lb 8 oz (69.2 kg)  03/11/24 151 lb 1.9 oz (68.5 kg)     Medications:  Allergies as of 05/06/2024       Reactions   Augmentin [amoxicillin -pot Clavulanate] Other (See Comments)   Muscle Aches   Prochlorperazine Edisylate Other (See Comments)   Nervous/ flutter/ shakes   Adhesive [tape] Hives   Redness/hives from adhesive tape( tolerates latex gloves); tolerates paper tape        Medication List        Accurate as of May 06, 2024 10:10 AM. If you have any questions, ask your nurse or  doctor.          albuterol  108 (90 Base) MCG/ACT inhaler Commonly known as: VENTOLIN  HFA TAKE 2 PUFFS BY MOUTH EVERY 6 HOURS AS NEEDED FOR WHEEZE OR SHORTNESS OF BREATH   amoxicillin  500 MG tablet Commonly known as: AMOXIL  Take 2,000 mg by mouth See admin instructions. Take 2,000 mg one hour prior to dental visits.   aspirin  EC 81 MG tablet Take 81 mg by mouth daily.   b complex vitamins capsule Take 1 capsule by mouth 2 (two) times daily.   carvedilol  3.125 MG tablet Commonly known as: COREG  Take 1 tablet (3.125 mg total) by mouth 2 (two) times daily with a meal.   cholecalciferol  25 MCG (1000 UNIT) tablet Commonly known as: VITAMIN D3 Take 4,000 Units by mouth daily.   furosemide  20 MG tablet Commonly known as: LASIX  Take 1 tablet (20 mg total) by mouth daily.   levothyroxine  100 MCG tablet Commonly known as: SYNTHROID  Take 1 tablet (100 mcg total) by mouth daily before breakfast.   lidocaine -prilocaine  cream Commonly known as: EMLA  Apply 1 Application topically as needed. Apply to port per instructions   loperamide  2 MG tablet Commonly known as: IMODIUM  A-D Take 4 mg by mouth 3 (three) times daily as needed for diarrhea  or loose stools.   LORazepam  1 MG tablet Commonly known as: ATIVAN  TAKE ONE TABLET BY MOUTH EVERY 8 HOURS AS NEEDED FOR ANXIETY   methocarbamol  500 MG tablet Commonly known as: ROBAXIN  TAKE ONE TABLET BY MOUTH EVERY 6 HOURS AS NEEDED FOR MUSCLE SPASMS   Potassium Chloride  ER 20 MEQ Tbcr Take 1 tablet (20 mEq total) by mouth daily.   potassium chloride  SA 20 MEQ tablet Commonly known as: KLOR-CON  M Take 20 mEq by mouth daily.   sodium chloride  0.9 % SOLN 50 mL with pembrolizumab  100 MG/4ML SOLN 200 mg Inject 200 mg into the vein every 21 ( twenty-one) days.   spironolactone  25 MG tablet Commonly known as: ALDACTONE  Take 1 tablet (25 mg total) by mouth daily.   traMADol  50 MG tablet Commonly known as: ULTRAM  TAKE ONE TABLET BY MOUTH  EVERY 6 HOURS AS NEEDED   Vitamin D  (Ergocalciferol ) 1.25 MG (50000 UNIT) Caps capsule Commonly known as: DRISDOL  TAKE ONE CAPSULE BY MOUTH every 7 DAYS   warfarin 7.5 MG tablet Commonly known as: COUMADIN  Take as directed by the anticoagulation clinic. If you are unsure how to take this medication, talk to your nurse or doctor. Original instructions: Take 1/2 tablet to 1 tablet by mouth daily as directed by Anticoagulation Clinic.   zolpidem  10 MG tablet Commonly known as: AMBIEN  TAKE ONE TABLET BY MOUTH EVERY DAY        Allergies:  Allergies  Allergen Reactions   Augmentin [Amoxicillin -Pot Clavulanate] Other (See Comments)    Muscle Aches   Prochlorperazine Edisylate Other (See Comments)    Nervous/ flutter/ shakes   Adhesive [Tape] Hives    Redness/hives from adhesive tape( tolerates latex gloves); tolerates paper tape    Past Medical History, Surgical history, Social history, and Family History were reviewed and updated.  Review of Systems: Review of Systems  Constitutional: Negative.   HENT: Negative.    Eyes: Negative.   Respiratory: Negative.    Cardiovascular: Negative.   Gastrointestinal: Negative.   Genitourinary: Negative.   Musculoskeletal: Negative.   Skin: Negative.   Neurological: Negative.   Endo/Heme/Allergies: Negative.   Psychiatric/Behavioral: Negative.        Physical Exam:  height is 5' 6 (1.676 m) and weight is 154 lb (69.9 kg). Her oral temperature is 97.9 F (36.6 C). Her blood pressure is 102/63 and her pulse is 84. Her respiration is 18 and oxygen saturation is 99%.   Wt Readings from Last 3 Encounters:  05/06/24 154 lb (69.9 kg)  04/01/24 152 lb 8 oz (69.2 kg)  03/11/24 151 lb 1.9 oz (68.5 kg)    Physical Exam Vitals reviewed.  HENT:     Head: Normocephalic and atraumatic.  Eyes:     Pupils: Pupils are equal, round, and reactive to light.  Cardiovascular:     Rate and Rhythm: Normal rate and regular rhythm.     Heart  sounds: Normal heart sounds.  Pulmonary:     Effort: Pulmonary effort is normal.     Breath sounds: Normal breath sounds.  Abdominal:     General: Bowel sounds are normal.     Palpations: Abdomen is soft.  Musculoskeletal:        General: No tenderness or deformity. Normal range of motion.     Cervical back: Normal range of motion.  Lymphadenopathy:     Cervical: No cervical adenopathy.  Skin:    General: Skin is warm and dry.     Findings: No erythema  or rash.  Neurological:     Mental Status: She is alert and oriented to person, place, and time.  Psychiatric:        Behavior: Behavior normal.        Thought Content: Thought content normal.        Judgment: Judgment normal.    Lab Results  Component Value Date   WBC 10.7 (H) 05/06/2024   HGB 11.5 (L) 05/06/2024   HCT 34.6 (L) 05/06/2024   MCV 91.1 05/06/2024   PLT 220 05/06/2024   Lab Results  Component Value Date   FERRITIN 143 04/01/2024   IRON 55 04/01/2024   TIBC 284 04/01/2024   UIBC 229 04/01/2024   IRONPCTSAT 19 04/01/2024   Lab Results  Component Value Date   RETICCTPCT 1.6 05/06/2024   RBC 3.80 (L) 05/06/2024   No results found for: KPAFRELGTCHN, LAMBDASER, KAPLAMBRATIO No results found for: IGGSERUM, IGA, IGMSERUM No results found for: STEPHANY CARLOTA BENSON MARKEL EARLA JOANNIE DOC VICK, SPEI   Chemistry      Component Value Date/Time   NA 140 05/06/2024 0911   NA 139 08/14/2020 1428   NA 140 03/15/2016 0953   K 3.8 05/06/2024 0911   K 3.9 03/15/2016 0953   CL 102 05/06/2024 0911   CL 102 12/13/2015 0803   CO2 25 05/06/2024 0911   CO2 19 (L) 03/15/2016 0953   BUN 18 05/06/2024 0911   BUN 16 08/14/2020 1428   BUN 15.9 03/15/2016 0953   CREATININE 1.05 (H) 05/06/2024 0911   CREATININE 1.4 (H) 03/15/2016 0953      Component Value Date/Time   CALCIUM  9.1 05/06/2024 0911   CALCIUM  9.3 03/15/2016 0953   ALKPHOS 59 05/06/2024 0911   ALKPHOS 55  03/15/2016 0953   AST 16 05/06/2024 0911   AST 17 03/15/2016 0953   ALT 11 05/06/2024 0911   ALT 23 03/15/2016 0953   BILITOT 0.3 05/06/2024 0911   BILITOT 0.38 03/15/2016 0953      Impression and Plan: Ms. Hoch is a very pleasant 63 yo caucasian female with recurrent granulosa cell tumor.  She is on Keytruda .  She actually has done incredibly well with the Keytruda .    I really think that we can continue with the Keytruda .  She is doing well with this.  She is having no complications with the Keytruda .  I really do not think that the CAT scan is bad enough that we have to make any kind of changes.  I think the only change that we could make would be to go to chemotherapy.  I know that she would not want to do this.  We will send off the urinalysis and urine culture.  As always, we will plan to get her back to see us  in another 3 weeks.     Maude JONELLE Crease, MD 11/13/202510:10 AM

## 2024-05-06 NOTE — Telephone Encounter (Signed)
-----   Message from Maude JONELLE Crease sent at 05/06/2024  2:01 PM EST ----- Please call and let her know that the iron level is okay.  Thanks.  Jeralyn ----- Message ----- From: Rebecka, Lab [facility] [place] Sent: 05/06/2024   9:37 AM EST To: Maude JONELLE Crease, MD

## 2024-05-06 NOTE — Telephone Encounter (Signed)
 Advised via MyChart.

## 2024-05-07 LAB — T4: T4, Total: 9.4 ug/dL (ref 4.5–12.0)

## 2024-05-08 ENCOUNTER — Ambulatory Visit: Payer: Self-pay | Admitting: Hematology & Oncology

## 2024-05-08 ENCOUNTER — Other Ambulatory Visit: Payer: Self-pay | Admitting: Hematology & Oncology

## 2024-05-08 LAB — URINE CULTURE: Culture: >=100000 — AB

## 2024-05-08 MED ORDER — LEVOFLOXACIN 500 MG PO TABS: 500.0000 mg | ORAL_TABLET | Freq: Every day | ORAL | 0 refills | Status: AC

## 2024-05-11 LAB — INHIBIN B: Inhibin B: 108.5 pg/mL — ABNORMAL HIGH (ref 0.0–16.9)

## 2024-05-12 ENCOUNTER — Ambulatory Visit

## 2024-05-18 ENCOUNTER — Other Ambulatory Visit: Payer: Self-pay | Admitting: Hematology & Oncology

## 2024-05-18 DIAGNOSIS — D3910 Neoplasm of uncertain behavior of unspecified ovary: Secondary | ICD-10-CM

## 2024-05-25 ENCOUNTER — Ambulatory Visit: Attending: Cardiovascular Disease | Admitting: Pharmacist

## 2024-05-25 DIAGNOSIS — Z953 Presence of xenogenic heart valve: Secondary | ICD-10-CM

## 2024-05-25 DIAGNOSIS — Z952 Presence of prosthetic heart valve: Secondary | ICD-10-CM

## 2024-05-25 DIAGNOSIS — I480 Paroxysmal atrial fibrillation: Secondary | ICD-10-CM

## 2024-05-25 LAB — POCT INR: INR: 3.1 — AB (ref 2.0–3.0)

## 2024-05-25 NOTE — Patient Instructions (Signed)
 Description   INR 3.1, NO PRINTOUT. Eat greens tonight and then continue taking warfarin 1/2 tablet daily except for 1 tablet on Tuesdays, Thursdays, and Saturdays.  Recheck INR in 4 weeks.  Coumadin  Clinic [phone]

## 2024-05-25 NOTE — Progress Notes (Signed)
 Description   INR 3.1, NO PRINTOUT. Eat greens tonight and then continue taking warfarin 1/2 tablet daily except for 1 tablet on Tuesdays, Thursdays, and Saturdays.  Recheck INR in 4 weeks.  Coumadin  Clinic [phone]

## 2024-05-27 ENCOUNTER — Inpatient Hospital Stay

## 2024-05-27 ENCOUNTER — Other Ambulatory Visit: Payer: Self-pay | Admitting: Hematology & Oncology

## 2024-05-27 ENCOUNTER — Inpatient Hospital Stay: Admitting: Hematology & Oncology

## 2024-05-27 ENCOUNTER — Inpatient Hospital Stay: Attending: Hematology & Oncology

## 2024-05-27 ENCOUNTER — Encounter: Payer: Self-pay | Admitting: Hematology & Oncology

## 2024-05-27 ENCOUNTER — Other Ambulatory Visit: Payer: Self-pay

## 2024-05-27 VITALS — BP 105/70 | HR 79 | Temp 98.3°F | Resp 18 | Ht 66.0 in | Wt 155.0 lb

## 2024-05-27 DIAGNOSIS — D3910 Neoplasm of uncertain behavior of unspecified ovary: Secondary | ICD-10-CM | POA: Diagnosis not present

## 2024-05-27 DIAGNOSIS — N183 Chronic kidney disease, stage 3 unspecified: Secondary | ICD-10-CM

## 2024-05-27 DIAGNOSIS — Z7901 Long term (current) use of anticoagulants: Secondary | ICD-10-CM | POA: Diagnosis not present

## 2024-05-27 DIAGNOSIS — C569 Malignant neoplasm of unspecified ovary: Secondary | ICD-10-CM | POA: Diagnosis present

## 2024-05-27 DIAGNOSIS — Z79620 Long term (current) use of immunosuppressive biologic: Secondary | ICD-10-CM | POA: Diagnosis not present

## 2024-05-27 DIAGNOSIS — Z5112 Encounter for antineoplastic immunotherapy: Secondary | ICD-10-CM | POA: Insufficient documentation

## 2024-05-27 DIAGNOSIS — Z79811 Long term (current) use of aromatase inhibitors: Secondary | ICD-10-CM | POA: Diagnosis not present

## 2024-05-27 DIAGNOSIS — Z953 Presence of xenogenic heart valve: Secondary | ICD-10-CM | POA: Insufficient documentation

## 2024-05-27 DIAGNOSIS — K909 Intestinal malabsorption, unspecified: Secondary | ICD-10-CM | POA: Diagnosis not present

## 2024-05-27 DIAGNOSIS — D509 Iron deficiency anemia, unspecified: Secondary | ICD-10-CM | POA: Diagnosis not present

## 2024-05-27 LAB — CMP (CANCER CENTER ONLY)
ALT: 9 U/L (ref 0–44)
AST: 16 U/L (ref 15–41)
Albumin: 3.6 g/dL (ref 3.5–5.0)
Alkaline Phosphatase: 58 U/L (ref 38–126)
Anion gap: 13 (ref 5–15)
BUN: 10 mg/dL (ref 8–23)
CO2: 24 mmol/L (ref 22–32)
Calcium: 8.7 mg/dL — ABNORMAL LOW (ref 8.9–10.3)
Chloride: 101 mmol/L (ref 98–111)
Creatinine: 0.97 mg/dL (ref 0.44–1.00)
GFR, Estimated: >60 mL/min (ref >60)
Glucose, Bld: 77 mg/dL (ref 70–99)
Potassium: 3.7 mmol/L (ref 3.5–5.1)
Sodium: 138 mmol/L (ref 135–145)
Total Bilirubin: 0.3 mg/dL (ref 0.0–1.2)
Total Protein: 6.5 g/dL (ref 6.5–8.1)

## 2024-05-27 LAB — CBC WITH DIFFERENTIAL (CANCER CENTER ONLY)
Abs Immature Granulocytes: 0.05 K/uL (ref 0.00–0.07)
Basophils Absolute: 0 K/uL (ref 0.0–0.1)
Basophils Relative: 0 %
Eosinophils Absolute: 0.1 K/uL (ref 0.0–0.5)
Eosinophils Relative: 1 %
HCT: 32.9 % — ABNORMAL LOW (ref 36.0–46.0)
Hemoglobin: 11 g/dL — ABNORMAL LOW (ref 12.0–15.0)
Immature Granulocytes: 1 %
Lymphocytes Relative: 5 %
Lymphs Abs: 0.5 K/uL — ABNORMAL LOW (ref 0.7–4.0)
MCH: 30.1 pg (ref 26.0–34.0)
MCHC: 33.4 g/dL (ref 30.0–36.0)
MCV: 89.9 fL (ref 80.0–100.0)
Monocytes Absolute: 0.9 K/uL (ref 0.1–1.0)
Monocytes Relative: 10 %
Neutro Abs: 8.2 K/uL — ABNORMAL HIGH (ref 1.7–7.7)
Neutrophils Relative %: 83 %
Platelet Count: 234 K/uL (ref 150–400)
RBC: 3.66 MIL/uL — ABNORMAL LOW (ref 3.87–5.11)
RDW: 13.5 % (ref 11.5–15.5)
WBC Count: 9.8 K/uL (ref 4.0–10.5)
nRBC: 0 % (ref 0.0–0.2)

## 2024-05-27 LAB — LACTATE DEHYDROGENASE: LDH: 151 U/L (ref 105–235)

## 2024-05-27 LAB — TSH: TSH: 0.183 u[IU]/mL — ABNORMAL LOW (ref 0.350–4.500)

## 2024-05-27 MED ORDER — SODIUM CHLORIDE 0.9 % IV SOLN: Freq: Once | INTRAVENOUS | Status: AC

## 2024-05-27 MED ORDER — SODIUM CHLORIDE 0.9 % IV SOLN
200.0000 mg | Freq: Once | INTRAVENOUS | Status: AC
  Administered 2024-05-27: 200 mg via INTRAVENOUS
  Filled 2024-05-27: qty 8

## 2024-05-27 NOTE — Patient Instructions (Signed)
 [facility] - A DEPT OF [facility]  Discharge Instructions: Thank you for choosing [facility] to provide your oncology and hematology care.   If you have a lab appointment with the Cancer Center, please go directly to the Cancer Center and check in at the registration area.  Wear comfortable clothing and clothing appropriate for easy access to any Portacath or PICC line.   We strive to give you quality time with your provider. You may need to reschedule your appointment if you arrive late (15 or more minutes).  Arriving late affects you and other patients whose appointments are after yours.  Also, if you miss three or more appointments without notifying the office, you may be dismissed from the clinic at the provider's discretion.      For prescription refill requests, have your pharmacy contact our office and allow 72 hours for refills to be completed.    Today you received the following chemotherapy and/or immunotherapy agents [name]       To help prevent nausea and vomiting after your treatment, we encourage you to take your nausea medication as directed.  BELOW ARE SYMPTOMS THAT SHOULD BE REPORTED IMMEDIATELY: *FEVER GREATER THAN 100.4 F (38 C) OR HIGHER *CHILLS OR SWEATING *NAUSEA AND VOMITING THAT IS NOT CONTROLLED WITH YOUR NAUSEA MEDICATION *UNUSUAL SHORTNESS OF BREATH *UNUSUAL BRUISING OR BLEEDING *URINARY PROBLEMS (pain or burning when urinating, or frequent urination) *BOWEL PROBLEMS (unusual diarrhea, constipation, pain near the anus) TENDERNESS IN MOUTH AND THROAT WITH OR WITHOUT PRESENCE OF ULCERS (sore throat, sores in mouth, or a toothache) UNUSUAL RASH, SWELLING OR PAIN  UNUSUAL VAGINAL DISCHARGE OR ITCHING   Items with * indicate a potential emergency and should be followed up as soon as possible or go to the Emergency Department if any problems should occur.  Please show the CHEMOTHERAPY ALERT CARD or IMMUNOTHERAPY  ALERT CARD at check-in to the Emergency Department and triage nurse. Should you have questions after your visit or need to cancel or reschedule your appointment, please contact [facility] - A DEPT OF [facility]  [phone] and follow the prompts.  Office hours are 8:00 a.m. to 4:30 p.m. Monday - Friday. Please note that voicemails left after 4:00 p.m. may not be returned until the following business day.  We are closed weekends and major holidays. You have access to a nurse at all times for urgent questions. Please call the main number to the clinic [phone] and follow the prompts.  For any non-urgent questions, you may also contact your provider using MyChart. We now offer e-Visits for anyone 65 and older to request care online for non-urgent symptoms. For details visit [email or web address].   Also download the MyChart app! Go to the app store, search "MyChart", open the app, select [place], and log in with your MyChart username and password.

## 2024-05-27 NOTE — Patient Instructions (Signed)

## 2024-05-27 NOTE — Progress Notes (Signed)
 Hematology and Oncology Follow Up Visit  [name] [id number] Jul 06, 1960 63 y.o. 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  She is actually doing pretty well.  She really has had no complaints.  Has a picture of her son who had a accident on his job.  He had a grate fall on his hand and broke some fingers and tore tendon.  He is going to have surgery tomorrow.  Her last Inhibin B was actually better at 108.  She saw her cardiologist.  They are managing her Coumadin .  Her last iron level back in November and showed a ferritin of 100 with iron saturation of 20%.  She has had no problems with cough or shortness of breath.  She has had a little bit of leg swelling, more so on the right lower leg.  She has little bit of a hematoma.  I think she will always have this as long she is on Coumadin .  She has had no change in bowel or bladder habits.  She has had no bleeding.  She has had no fever.  She has had no headache.  Overall, I will say that her performance status is ECOG 1.   Wt Readings from Last 3 Encounters:  05/27/24 155 lb (70.3 kg)  05/06/24 154 lb (69.9 kg)  04/01/24 152 lb 8 oz (69.2 kg)     Medications:  Allergies as of 05/27/2024       Reactions   Augmentin [amoxicillin -pot Clavulanate] Other (See Comments)   Muscle Aches   Prochlorperazine Edisylate Other (See Comments)   Nervous/ flutter/ shakes   Adhesive [tape] Hives   Redness/hives from adhesive tape( tolerates latex gloves); tolerates paper tape        Medication List        Accurate as of May 27, 2024 10:37  AM. If you have any questions, ask your nurse or doctor.          albuterol  108 (90 Base) MCG/ACT inhaler Commonly known as: VENTOLIN  HFA TAKE 2 PUFFS BY MOUTH EVERY 6 HOURS AS NEEDED FOR WHEEZE OR SHORTNESS OF BREATH   amoxicillin  500 MG tablet Commonly known as: AMOXIL  Take 2,000 mg by mouth See admin instructions. Take 2,000 mg one hour prior to dental visits.   aspirin  EC 81 MG tablet Take 81 mg by mouth daily.   b complex vitamins capsule Take 1 capsule by mouth 2 (two) times daily.   carvedilol  3.125 MG tablet Commonly known as: COREG  Take 1 tablet (3.125 mg total) by mouth 2 (two) times daily with a meal.   cholecalciferol  25 MCG (1000 UNIT) tablet Commonly known as: VITAMIN D3 Take 4,000 Units by mouth daily.   furosemide  20 MG tablet Commonly known as: LASIX  Take 1 tablet (20 mg total) by mouth daily.   levofloxacin  500 MG tablet Commonly known as: LEVAQUIN  Take 1 tablet (500 mg total) by mouth daily.   levothyroxine  100 MCG tablet Commonly known as: SYNTHROID  Take 1 tablet (100 mcg total) by mouth daily before breakfast.   lidocaine -prilocaine  cream Commonly  known as: EMLA  Apply 1 Application topically as needed. Apply to port per instructions   loperamide  2 MG tablet Commonly known as: IMODIUM  A-D Take 4 mg by mouth 3 (three) times daily as needed for diarrhea or loose stools.   LORazepam  1 MG tablet Commonly known as: ATIVAN  TAKE ONE TABLET BY MOUTH EVERY 8 HOURS AS NEEDED FOR ANXIETY   methocarbamol  500 MG tablet Commonly known as: ROBAXIN  TAKE ONE TABLET BY MOUTH EVERY 6 HOURS AS NEEDED FOR MUSCLE SPASMS   Potassium Chloride  ER 20 MEQ Tbcr Take 1 tablet (20 mEq total) by mouth daily.   potassium chloride  SA 20 MEQ tablet Commonly known as: KLOR-CON  M Take 20 mEq by mouth daily.   sodium chloride  0.9 % SOLN 50 mL with pembrolizumab  100 MG/4ML SOLN 200 mg Inject 200 mg into the vein every 21 ( twenty-one) days.   spironolactone  25 MG  tablet Commonly known as: ALDACTONE  Take 1 tablet (25 mg total) by mouth daily.   traMADol  50 MG tablet Commonly known as: ULTRAM  TAKE ONE TABLET BY MOUTH EVERY 6 HOURS AS NEEDED   Vitamin D  (Ergocalciferol ) 1.25 MG (50000 UNIT) Caps capsule Commonly known as: DRISDOL  TAKE ONE CAPSULE BY MOUTH every 7 DAYS   warfarin 7.5 MG tablet Commonly known as: COUMADIN  Take as directed by the anticoagulation clinic. If you are unsure how to take this medication, talk to your nurse or doctor. Original instructions: Take 1/2 tablet to 1 tablet by mouth daily as directed by Anticoagulation Clinic.   zolpidem  10 MG tablet Commonly known as: AMBIEN  TAKE ONE TABLET BY MOUTH EVERY DAY        Allergies:  Allergies  Allergen Reactions   Augmentin [Amoxicillin -Pot Clavulanate] Other (See Comments)    Muscle Aches   Prochlorperazine Edisylate Other (See Comments)    Nervous/ flutter/ shakes   Adhesive [Tape] Hives    Redness/hives from adhesive tape( tolerates latex gloves); tolerates paper tape    Past Medical History, Surgical history, Social history, and Family History were reviewed and updated.  Review of Systems: Review of Systems  Constitutional: Negative.   HENT: Negative.    Eyes: Negative.   Respiratory: Negative.    Cardiovascular: Negative.   Gastrointestinal: Negative.   Genitourinary: Negative.   Musculoskeletal: Negative.   Skin: Negative.   Neurological: Negative.   Endo/Heme/Allergies: Negative.   Psychiatric/Behavioral: Negative.        Physical Exam:  height is 5' 6 (1.676 m) and weight is 155 lb (70.3 kg). Her oral temperature is 98.3 F (36.8 C). Her blood pressure is 105/70 and her pulse is 79. Her respiration is 18 and oxygen saturation is 100%.   Wt Readings from Last 3 Encounters:  05/27/24 155 lb (70.3 kg)  05/06/24 154 lb (69.9 kg)  04/01/24 152 lb 8 oz (69.2 kg)    Physical Exam Vitals reviewed.  HENT:     Head: Normocephalic and atraumatic.   Eyes:     Pupils: Pupils are equal, round, and reactive to light.  Cardiovascular:     Rate and Rhythm: Normal rate and regular rhythm.     Heart sounds: Normal heart sounds.  Pulmonary:     Effort: Pulmonary effort is normal.     Breath sounds: Normal breath sounds.  Abdominal:     General: Bowel sounds are normal.     Palpations: Abdomen is soft.  Musculoskeletal:        General: No tenderness or deformity. Normal range of motion.  Cervical back: Normal range of motion.  Lymphadenopathy:     Cervical: No cervical adenopathy.  Skin:    General: Skin is warm and dry.     Findings: No erythema or rash.  Neurological:     Mental Status: She is alert and oriented to person, place, and time.  Psychiatric:        Behavior: Behavior normal.        Thought Content: Thought content normal.        Judgment: Judgment normal.    Lab Results  Component Value Date   WBC 9.8 05/27/2024   HGB 11.0 (L) 05/27/2024   HCT 32.9 (L) 05/27/2024   MCV 89.9 05/27/2024   PLT 234 05/27/2024   Lab Results  Component Value Date   FERRITIN 100 05/06/2024   IRON 59 05/06/2024   TIBC 295 05/06/2024   UIBC 236 05/06/2024   IRONPCTSAT 20 05/06/2024   Lab Results  Component Value Date   RETICCTPCT 1.6 05/06/2024   RBC 3.66 (L) 05/27/2024   No results found for: KPAFRELGTCHN, LAMBDASER, KAPLAMBRATIO No results found for: KIMBERLY LE, IGMSERUM No results found for: STEPHANY CARLOTA BENSON MARKEL EARLA JOANNIE DOC VICK, SPEI   Chemistry      Component Value Date/Time   NA 138 05/27/2024 0918   NA 139 08/14/2020 1428   NA 140 03/15/2016 0953   K 3.7 05/27/2024 0918   K 3.9 03/15/2016 0953   CL 101 05/27/2024 0918   CL 102 12/13/2015 0803   CO2 24 05/27/2024 0918   CO2 19 (L) 03/15/2016 0953   BUN 10 05/27/2024 0918   BUN 16 08/14/2020 1428   BUN 15.9 03/15/2016 0953   CREATININE 0.97 05/27/2024 0918   CREATININE 1.4 (H) 03/15/2016 0953       Component Value Date/Time   CALCIUM  8.7 (L) 05/27/2024 0918   CALCIUM  9.3 03/15/2016 0953   ALKPHOS 58 05/27/2024 0918   ALKPHOS 55 03/15/2016 0953   AST 16 05/27/2024 0918   AST 17 03/15/2016 0953   ALT 9 05/27/2024 0918   ALT 23 03/15/2016 0953   BILITOT 0.3 05/27/2024 0918   BILITOT 0.38 03/15/2016 0953      Impression and Plan: [name] is a very pleasant 63 yo caucasian female with recurrent granulosa cell tumor.  She is on Keytruda .  She actually has done incredibly well with the Keytruda .   For right now, we will continue her on Keytruda .  I think as long as a Inhibin B is holding within a certain range, then we should be able to keep her on this.  For right now, we will plan to get her back in 3 more weeks.    Stacy JONELLE Crease, MD 12/4/202510:37 AM

## 2024-05-28 LAB — T4: T4, Total: 8.8 ug/dL (ref 4.5–12.0)

## 2024-06-01 LAB — INHIBIN B: Inhibin B: 149.3 pg/mL — ABNORMAL HIGH (ref 0.0–16.9)

## 2024-06-04 ENCOUNTER — Other Ambulatory Visit: Payer: Self-pay | Admitting: Hematology & Oncology

## 2024-06-15 ENCOUNTER — Other Ambulatory Visit: Payer: Self-pay | Admitting: Hematology & Oncology

## 2024-06-15 DIAGNOSIS — D3910 Neoplasm of uncertain behavior of unspecified ovary: Secondary | ICD-10-CM

## 2024-06-16 ENCOUNTER — Inpatient Hospital Stay: Admitting: Hematology & Oncology

## 2024-06-16 ENCOUNTER — Inpatient Hospital Stay

## 2024-06-21 ENCOUNTER — Inpatient Hospital Stay: Admitting: Family

## 2024-06-21 ENCOUNTER — Encounter: Payer: Self-pay | Admitting: Family

## 2024-06-21 ENCOUNTER — Inpatient Hospital Stay

## 2024-06-21 ENCOUNTER — Other Ambulatory Visit: Payer: Self-pay | Admitting: Hematology & Oncology

## 2024-06-21 VITALS — BP 90/57 | HR 79

## 2024-06-21 VITALS — BP 91/63 | HR 81 | Temp 98.2°F | Resp 18 | Wt 149.0 lb

## 2024-06-21 DIAGNOSIS — E039 Hypothyroidism, unspecified: Secondary | ICD-10-CM | POA: Diagnosis not present

## 2024-06-21 DIAGNOSIS — R7989 Other specified abnormal findings of blood chemistry: Secondary | ICD-10-CM | POA: Diagnosis not present

## 2024-06-21 DIAGNOSIS — D3910 Neoplasm of uncertain behavior of unspecified ovary: Secondary | ICD-10-CM | POA: Diagnosis not present

## 2024-06-21 DIAGNOSIS — Z5112 Encounter for antineoplastic immunotherapy: Secondary | ICD-10-CM | POA: Diagnosis not present

## 2024-06-21 LAB — IRON AND IRON BINDING CAPACITY (CC-WL,HP ONLY)
Iron: 40 ug/dL (ref 28–170)
Saturation Ratios: 13 % (ref 10.4–31.8)
TIBC: 302 ug/dL (ref 250–450)
UIBC: 263 ug/dL

## 2024-06-21 LAB — CBC WITH DIFFERENTIAL (CANCER CENTER ONLY)
Abs Immature Granulocytes: 0.04 K/uL (ref 0.00–0.07)
Basophils Absolute: 0 K/uL (ref 0.0–0.1)
Basophils Relative: 0 %
Eosinophils Absolute: 0.1 K/uL (ref 0.0–0.5)
Eosinophils Relative: 1 %
HCT: 32.2 % — ABNORMAL LOW (ref 36.0–46.0)
Hemoglobin: 10.6 g/dL — ABNORMAL LOW (ref 12.0–15.0)
Immature Granulocytes: 0 %
Lymphocytes Relative: 5 %
Lymphs Abs: 0.5 K/uL — ABNORMAL LOW (ref 0.7–4.0)
MCH: 29.8 pg (ref 26.0–34.0)
MCHC: 32.9 g/dL (ref 30.0–36.0)
MCV: 90.4 fL (ref 80.0–100.0)
Monocytes Absolute: 0.8 K/uL (ref 0.1–1.0)
Monocytes Relative: 8 %
Neutro Abs: 8.8 K/uL — ABNORMAL HIGH (ref 1.7–7.7)
Neutrophils Relative %: 86 %
Platelet Count: 257 K/uL (ref 150–400)
RBC: 3.56 MIL/uL — ABNORMAL LOW (ref 3.87–5.11)
RDW: 13.4 % (ref 11.5–15.5)
WBC Count: 10.2 K/uL (ref 4.0–10.5)
nRBC: 0 % (ref 0.0–0.2)

## 2024-06-21 LAB — FERRITIN: Ferritin: 60 ng/mL (ref 11–307)

## 2024-06-21 LAB — CMP (CANCER CENTER ONLY)
ALT: 11 U/L (ref 0–44)
AST: 19 U/L (ref 15–41)
Albumin: 3.7 g/dL (ref 3.5–5.0)
Alkaline Phosphatase: 61 U/L (ref 38–126)
Anion gap: 11 (ref 5–15)
BUN: 14 mg/dL (ref 8–23)
CO2: 26 mmol/L (ref 22–32)
Calcium: 9.1 mg/dL (ref 8.9–10.3)
Chloride: 100 mmol/L (ref 98–111)
Creatinine: 0.97 mg/dL (ref 0.44–1.00)
GFR, Estimated: >60 mL/min
Glucose, Bld: 81 mg/dL (ref 70–99)
Potassium: 4.1 mmol/L (ref 3.5–5.1)
Sodium: 138 mmol/L (ref 135–145)
Total Bilirubin: 0.3 mg/dL (ref 0.0–1.2)
Total Protein: 6.5 g/dL (ref 6.5–8.1)

## 2024-06-21 LAB — TSH: TSH: 0.16 u[IU]/mL — ABNORMAL LOW (ref 0.350–4.500)

## 2024-06-21 MED ORDER — SODIUM CHLORIDE 0.9 % IV SOLN: Freq: Once | INTRAVENOUS | Status: AC

## 2024-06-21 MED ORDER — SODIUM CHLORIDE 0.9 % IV SOLN
200.0000 mg | Freq: Once | INTRAVENOUS | Status: AC
  Administered 2024-06-21: 200 mg via INTRAVENOUS
  Filled 2024-06-21: qty 8

## 2024-06-21 NOTE — Patient Instructions (Signed)
 [facility] - A DEPT OF [facility]  Discharge Instructions: Thank you for choosing [facility] to provide your oncology and hematology care.   If you have a lab appointment with the Cancer Center, please go directly to the Cancer Center and check in at the registration area.  Wear comfortable clothing and clothing appropriate for easy access to any Portacath or PICC line.   We strive to give you quality time with your provider. You may need to reschedule your appointment if you arrive late (15 or more minutes).  Arriving late affects you and other patients whose appointments are after yours.  Also, if you miss three or more appointments without notifying the office, you may be dismissed from the clinic at the provider's discretion.      For prescription refill requests, have your pharmacy contact our office and allow 72 hours for refills to be completed.    Today you received the following chemotherapy and/or immunotherapy agents [name]       To help prevent nausea and vomiting after your treatment, we encourage you to take your nausea medication as directed.  BELOW ARE SYMPTOMS THAT SHOULD BE REPORTED IMMEDIATELY: *FEVER GREATER THAN 100.4 F (38 C) OR HIGHER *CHILLS OR SWEATING *NAUSEA AND VOMITING THAT IS NOT CONTROLLED WITH YOUR NAUSEA MEDICATION *UNUSUAL SHORTNESS OF BREATH *UNUSUAL BRUISING OR BLEEDING *URINARY PROBLEMS (pain or burning when urinating, or frequent urination) *BOWEL PROBLEMS (unusual diarrhea, constipation, pain near the anus) TENDERNESS IN MOUTH AND THROAT WITH OR WITHOUT PRESENCE OF ULCERS (sore throat, sores in mouth, or a toothache) UNUSUAL RASH, SWELLING OR PAIN  UNUSUAL VAGINAL DISCHARGE OR ITCHING   Items with * indicate a potential emergency and should be followed up as soon as possible or go to the Emergency Department if any problems should occur.  Please show the CHEMOTHERAPY ALERT CARD or IMMUNOTHERAPY  ALERT CARD at check-in to the Emergency Department and triage nurse. Should you have questions after your visit or need to cancel or reschedule your appointment, please contact [facility] - A DEPT OF [facility]  [phone] and follow the prompts.  Office hours are 8:00 a.m. to 4:30 p.m. Monday - Friday. Please note that voicemails left after 4:00 p.m. may not be returned until the following business day.  We are closed weekends and major holidays. You have access to a nurse at all times for urgent questions. Please call the main number to the clinic [phone] and follow the prompts.  For any non-urgent questions, you may also contact your provider using MyChart. We now offer e-Visits for anyone 65 and older to request care online for non-urgent symptoms. For details visit [email or web address].   Also download the MyChart app! Go to the app store, search "MyChart", open the app, select [place], and log in with your MyChart username and password.

## 2024-06-21 NOTE — Progress Notes (Signed)
 " Hematology and Oncology Follow Up Visit  [name] [id number] 1961-01-15 63 y.o. 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 Gruenhagen is here today for follow-up and treatment. She is feeling increased fatigue at this time.  She did start back to work last week M,W, F. She doesn't feel like this negatively impacted her at all.  No fever, chills, n/v, cough, rash, dizziness, SOB, chest pain, palpitations, abdominal pain or changes in bowel or bladder habits.  No obvious blood loss noted.  No swelling, tenderness, numbness or tingling in her extremities.  No falls or syncope.  Appetite and hydration are good. Weight is stable at 149 lbs.   ECOG Performance Status: 1 - Symptomatic but completely ambulatory  Medications:  Allergies as of 06/21/2024       Reactions   Augmentin [amoxicillin -pot Clavulanate] Other (See Comments)   Muscle Aches   Prochlorperazine Edisylate Other (See Comments)   Nervous/ flutter/ shakes   Adhesive [tape] Hives   Redness/hives from adhesive tape( tolerates latex gloves); tolerates paper tape        Medication List        Accurate as of June 21, 2024  9:33 AM. If you have any questions, ask your nurse or doctor.          albuterol  108 (90 Base) MCG/ACT inhaler Commonly known as: VENTOLIN  HFA TAKE 2 PUFFS BY MOUTH EVERY 6 HOURS AS NEEDED FOR WHEEZE OR SHORTNESS OF BREATH   amoxicillin  500 MG tablet Commonly known as: AMOXIL  Take 2,000 mg by mouth See admin instructions. Take 2,000 mg one hour prior to dental visits.   aspirin  EC 81 MG tablet Take 81 mg by mouth daily.   b complex vitamins capsule Take 1 capsule by mouth 2  (two) times daily.   carvedilol  3.125 MG tablet Commonly known as: COREG  Take 1 tablet (3.125 mg total) by mouth 2 (two) times daily with a meal.   cholecalciferol  25 MCG (1000 UNIT) tablet Commonly known as: VITAMIN D3 Take 4,000 Units by mouth daily.   furosemide  20 MG tablet Commonly known as: LASIX  Take 1 tablet (20 mg total) by mouth daily.   levofloxacin  500 MG tablet Commonly known as: LEVAQUIN  Take 1 tablet (500 mg total) by mouth daily.   levothyroxine  100 MCG tablet Commonly known as: SYNTHROID  Take 1 tablet (100 mcg total) by mouth daily before breakfast.   lidocaine -prilocaine  cream Commonly known as: EMLA  Apply 1 Application topically as needed. Apply to port per instructions   loperamide  2 MG tablet Commonly known as: IMODIUM  A-D Take 4 mg by mouth 3 (three) times daily as needed for diarrhea or loose stools.   LORazepam  1 MG tablet Commonly known as: ATIVAN  TAKE ONE TABLET BY MOUTH EVERY 8 HOURS AS NEEDED FOR ANXIETY   methocarbamol  500 MG tablet Commonly known as: ROBAXIN  TAKE ONE TABLET BY MOUTH EVERY 6 HOURS AS NEEDED FOR MUSCLE SPASMS   Potassium Chloride  ER 20 MEQ Tbcr Take 1 tablet (20 mEq total) by mouth daily.   potassium chloride  SA 20 MEQ tablet Commonly known as: KLOR-CON  M Take 20 mEq by mouth daily.   sodium  chloride 0.9 % SOLN 50 mL with pembrolizumab  100 MG/4ML SOLN 200 mg Inject 200 mg into the vein every 21 ( twenty-one) days.   spironolactone  25 MG tablet Commonly known as: ALDACTONE  Take 1 tablet (25 mg total) by mouth daily.   traMADol  50 MG tablet Commonly known as: ULTRAM  TAKE ONE TABLET BY MOUTH EVERY 6 HOURS AS NEEDED   Vitamin D  (Ergocalciferol ) 1.25 MG (50000 UNIT) Caps capsule Commonly known as: DRISDOL  TAKE ONE CAPSULE BY MOUTH every 7 DAYS   warfarin 7.5 MG tablet Commonly known as: COUMADIN  Take as directed by the anticoagulation clinic. If you are unsure how to take this medication, talk to your nurse or  doctor. Original instructions: Take 1/2 tablet to 1 tablet by mouth daily as directed by Anticoagulation Clinic.   zolpidem  10 MG tablet Commonly known as: AMBIEN  TAKE ONE TABLET BY MOUTH EVERY DAY        Allergies: Allergies[1]  Past Medical History, Surgical history, Social history, and Family History were reviewed and updated.  Review of Systems: All other 10 point review of systems is negative.   Physical Exam:  weight is 149 lb (67.6 kg). Her oral temperature is 98.2 F (36.8 C). Her blood pressure is 91/63 and her pulse is 81. Her respiration is 18 and oxygen saturation is 99%.   Wt Readings from Last 3 Encounters:  06/21/24 149 lb (67.6 kg)  05/27/24 155 lb (70.3 kg)  05/06/24 154 lb (69.9 kg)    Ocular: Sclerae unicteric, pupils equal, round and reactive to light Ear-nose-throat: Oropharynx clear, dentition fair Lymphatic: No cervical or supraclavicular adenopathy Lungs no rales or rhonchi, good excursion bilaterally Heart regular rate and rhythm, no murmur appreciated Abd soft, nontender, positive bowel sounds MSK no focal spinal tenderness, no joint edema Neuro: non-focal, well-oriented, appropriate affect Breasts: Deferred  Lab Results  Component Value Date   WBC 10.2 06/21/2024   HGB 10.6 (L) 06/21/2024   HCT 32.2 (L) 06/21/2024   MCV 90.4 06/21/2024   PLT 257 06/21/2024   Lab Results  Component Value Date   FERRITIN 100 05/06/2024   IRON 59 05/06/2024   TIBC 295 05/06/2024   UIBC 236 05/06/2024   IRONPCTSAT 20 05/06/2024   Lab Results  Component Value Date   RETICCTPCT 1.6 05/06/2024   RBC 3.56 (L) 06/21/2024   No results found for: KPAFRELGTCHN, LAMBDASER, KAPLAMBRATIO No results found for: KIMBERLY LE, IGMSERUM No results found for: STEPHANY CARLOTA BENSON MARKEL EARLA JOANNIE DOC VICK, SPEI   Chemistry      Component Value Date/Time   NA 138 05/27/2024 0918   NA 139 08/14/2020 1428   NA 140  03/15/2016 0953   K 3.7 05/27/2024 0918   K 3.9 03/15/2016 0953   CL 101 05/27/2024 0918   CL 102 12/13/2015 0803   CO2 24 05/27/2024 0918   CO2 19 (L) 03/15/2016 0953   BUN 10 05/27/2024 0918   BUN 16 08/14/2020 1428   BUN 15.9 03/15/2016 0953   CREATININE 0.97 05/27/2024 0918   CREATININE 1.4 (H) 03/15/2016 0953      Component Value Date/Time   CALCIUM  8.7 (L) 05/27/2024 0918   CALCIUM  9.3 03/15/2016 0953   ALKPHOS 58 05/27/2024 0918   ALKPHOS 55 03/15/2016 0953   AST 16 05/27/2024 0918   AST 17 03/15/2016 0953   ALT 9 05/27/2024 0918   ALT 23 03/15/2016 0953   BILITOT 0.3 05/27/2024 0918   BILITOT 0.38 03/15/2016 9046  Impression and Plan: Ms. Langstaff is a very pleasant 63 yo caucasian female with recurrent granulosa cell tumor. She is doing well so far on Keytruda .  Inhibin B last visit was 149 (previously 108) and we continue to follow closely. Today's result is pending.  We will proceed with treatment today as planned.  Iron and thyroid  studies also pending. We will treat as indicated.  Follow-up in 3 weeks.   Lauraine Pepper, NP 12/29/20259:33 AM     [1]  Allergies Allergen Reactions   Augmentin [Amoxicillin -Pot Clavulanate] Other (See Comments)    Muscle Aches   Prochlorperazine Edisylate Other (See Comments)    Nervous/ flutter/ shakes   Adhesive [Tape] Hives    Redness/hives from adhesive tape( tolerates latex gloves); tolerates paper tape   "

## 2024-06-22 ENCOUNTER — Ambulatory Visit: Attending: Cardiovascular Disease | Admitting: *Deleted

## 2024-06-22 DIAGNOSIS — Z952 Presence of prosthetic heart valve: Secondary | ICD-10-CM

## 2024-06-22 DIAGNOSIS — I480 Paroxysmal atrial fibrillation: Secondary | ICD-10-CM | POA: Diagnosis not present

## 2024-06-22 DIAGNOSIS — Z953 Presence of xenogenic heart valve: Secondary | ICD-10-CM | POA: Diagnosis not present

## 2024-06-22 DIAGNOSIS — Z5181 Encounter for therapeutic drug level monitoring: Secondary | ICD-10-CM | POA: Diagnosis not present

## 2024-06-22 LAB — POCT INR: INR: 2.7 (ref 2.0–3.0)

## 2024-06-22 LAB — T4: T4, Total: 8.8 ug/dL (ref 4.5–12.0)

## 2024-06-22 LAB — INHIBIN B: Inhibin B: 100.8 pg/mL — ABNORMAL HIGH (ref 0.0–16.9)

## 2024-06-22 NOTE — Progress Notes (Signed)
 Description   INR-2.7, NO PRINTOUT. Continue taking warfarin 1/2 tablet daily except for 1 tablet on Tuesdays, Thursdays, and Saturdays.  Recheck INR in 4 weeks.  Coumadin  Clinic [phone]

## 2024-06-22 NOTE — Patient Instructions (Signed)
 Description   INR-2.7, NO PRINTOUT. Continue taking warfarin 1/2 tablet daily except for 1 tablet on Tuesdays, Thursdays, and Saturdays.  Recheck INR in 4 weeks.  Coumadin  Clinic [phone]

## 2024-06-23 ENCOUNTER — Other Ambulatory Visit: Payer: Self-pay | Admitting: Family

## 2024-06-23 ENCOUNTER — Telehealth: Payer: Self-pay | Admitting: Hematology & Oncology

## 2024-06-23 NOTE — Telephone Encounter (Signed)
 Called to schedule infusion per inbasket. LVM to return call for scheduling.

## 2024-06-29 ENCOUNTER — Ambulatory Visit: Payer: 59

## 2024-06-29 ENCOUNTER — Inpatient Hospital Stay: Payer: Self-pay | Attending: Hematology & Oncology

## 2024-06-29 VITALS — BP 98/65 | HR 79 | Temp 97.6°F | Resp 18

## 2024-06-29 DIAGNOSIS — T451X5A Adverse effect of antineoplastic and immunosuppressive drugs, initial encounter: Secondary | ICD-10-CM

## 2024-06-29 DIAGNOSIS — D509 Iron deficiency anemia, unspecified: Secondary | ICD-10-CM | POA: Insufficient documentation

## 2024-06-29 DIAGNOSIS — I480 Paroxysmal atrial fibrillation: Secondary | ICD-10-CM | POA: Diagnosis not present

## 2024-06-29 DIAGNOSIS — C569 Malignant neoplasm of unspecified ovary: Secondary | ICD-10-CM | POA: Insufficient documentation

## 2024-06-29 MED ORDER — SODIUM CHLORIDE 0.9 % IV SOLN
125.0000 mg | Freq: Once | INTRAVENOUS | Status: AC
  Administered 2024-06-29: 125 mg via INTRAVENOUS
  Filled 2024-06-29: qty 10

## 2024-06-29 MED ORDER — SODIUM CHLORIDE 0.9 % IV SOLN: Freq: Once | INTRAVENOUS | Status: AC

## 2024-06-29 NOTE — Patient Instructions (Signed)
Sodium Ferric Gluconate Complex Injection What is this medication? SODIUM FERRIC GLUCONATE COMPLEX (SOE dee um FER ik GLOO koe nate KOM pleks) treats low levels of iron (iron deficiency anemia) in people with kidney disease. Iron is a mineral that plays an important role in making red blood cells, which carry oxygen from your lungs to the rest of your body. This medicine may be used for other purposes; ask your health care provider or pharmacist if you have questions. COMMON BRAND NAME(S): Ferrlecit, Nulecit What should I tell my care team before I take this medication? They need to know if you have any of the following conditions: Anemia that is not from iron deficiency High levels of iron in the blood An unusual or allergic reaction to iron, other medications, foods, dyes, or preservatives Pregnant or are trying to become pregnant Breast-feeding How should I use this medication? This medication is injected into a vein. It is given by your care team in a hospital or clinic setting. Talk to your care team about the use of this medication in children. While it may be prescribed for children as young as 6 years for selected conditions, precautions do apply. Overdosage: If you think you have taken too much of this medicine contact a poison control center or emergency room at once. NOTE: This medicine is only for you. Do not share this medicine with others. What if I miss a dose? It is important not to miss your dose. Call your care team if you are unable to keep an appointment. What may interact with this medication? Do not take this medication with any of the following: Deferasirox Deferoxamine Dimercaprol This medication may also interact with the following: Other iron products This list may not describe all possible interactions. Give your health care provider a list of all the medicines, herbs, non-prescription drugs, or dietary supplements you use. Also tell them if you smoke, drink  alcohol, or use illegal drugs. Some items may interact with your medicine. What should I watch for while using this medication? Your condition will be monitored carefully while you are receiving this medication. Visit your care team for regular checks on your progress. You may need blood work while you are taking this medication. What side effects may I notice from receiving this medication? Side effects that you should report to your care team as soon as possible: Allergic reactions--skin rash, itching, hives, swelling of the face, lips, tongue, or throat Low blood pressure--dizziness, feeling faint or lightheaded, blurry vision Shortness of breath Side effects that usually do not require medical attention (report to your care team if they continue or are bothersome): Flushing Headache Joint pain Muscle pain Nausea Pain, redness, or irritation at injection site This list may not describe all possible side effects. Call your doctor for medical advice about side effects. You may report side effects to FDA at 1-800-FDA-1088. Where should I keep my medication? This medication is given in a hospital or clinic and will not be stored at home. NOTE: This sheet is a summary. It may not cover all possible information. If you have questions about this medicine, talk to your doctor, pharmacist, or health care provider.  2024 Elsevier/Gold Standard (2020-11-03 00:00:00)

## 2024-06-30 LAB — CUP PACEART REMOTE DEVICE CHECK
Battery Remaining Longevity: 65 mo
Battery Remaining Percentage: 62 %
Battery Voltage: 2.98 V
Brady Statistic AP VP Percent: 1 %
Brady Statistic AP VS Percent: 3.9 %
Brady Statistic AS VP Percent: 1 %
Brady Statistic AS VS Percent: 93 %
Brady Statistic RA Percent Paced: 1 %
Brady Statistic RV Percent Paced: 1 %
Date Time Interrogation Session: 20260106031918
HighPow Impedance: 62 Ohm
Implantable Lead Connection Status: 753985
Implantable Lead Connection Status: 753985
Implantable Lead Implant Date: 20110503
Implantable Lead Implant Date: 20130815
Implantable Lead Location: 753859
Implantable Lead Location: 753860
Implantable Pulse Generator Implant Date: 20211012
Lead Channel Impedance Value: 250 Ohm
Lead Channel Impedance Value: 380 Ohm
Lead Channel Pacing Threshold Amplitude: 0.5 V
Lead Channel Pacing Threshold Amplitude: 1.625 V
Lead Channel Pacing Threshold Pulse Width: 0.5 ms
Lead Channel Pacing Threshold Pulse Width: 1 ms
Lead Channel Sensing Intrinsic Amplitude: 11.9 mV
Lead Channel Sensing Intrinsic Amplitude: 2 mV
Lead Channel Setting Pacing Amplitude: 1.875
Lead Channel Setting Pacing Amplitude: 2 V
Lead Channel Setting Pacing Pulse Width: 1 ms
Lead Channel Setting Sensing Sensitivity: 0.5 mV
Pulse Gen Serial Number: 111028151
Zone Setting Status: 755011

## 2024-07-01 NOTE — Progress Notes (Signed)
 Remote ICD Transmission

## 2024-07-03 ENCOUNTER — Ambulatory Visit: Payer: Self-pay | Admitting: Cardiovascular Disease

## 2024-07-08 ENCOUNTER — Encounter: Payer: Self-pay | Admitting: Hematology & Oncology

## 2024-07-12 ENCOUNTER — Inpatient Hospital Stay: Payer: Self-pay

## 2024-07-12 ENCOUNTER — Inpatient Hospital Stay: Payer: Self-pay | Admitting: Hematology & Oncology

## 2024-07-15 ENCOUNTER — Other Ambulatory Visit: Payer: Self-pay | Admitting: Hematology & Oncology

## 2024-07-15 DIAGNOSIS — D3910 Neoplasm of uncertain behavior of unspecified ovary: Secondary | ICD-10-CM

## 2024-07-20 ENCOUNTER — Ambulatory Visit: Payer: Self-pay

## 2024-07-20 ENCOUNTER — Other Ambulatory Visit: Payer: Self-pay | Admitting: Hematology & Oncology

## 2024-07-20 DIAGNOSIS — F419 Anxiety disorder, unspecified: Secondary | ICD-10-CM

## 2024-07-26 ENCOUNTER — Ambulatory Visit: Payer: Self-pay

## 2024-07-27 ENCOUNTER — Telehealth: Payer: Self-pay

## 2024-07-27 ENCOUNTER — Other Ambulatory Visit: Payer: Self-pay | Admitting: Family

## 2024-07-27 ENCOUNTER — Telehealth: Payer: Self-pay | Admitting: *Deleted

## 2024-07-27 DIAGNOSIS — M2550 Pain in unspecified joint: Secondary | ICD-10-CM

## 2024-07-27 DIAGNOSIS — M2540 Effusion, unspecified joint: Secondary | ICD-10-CM

## 2024-07-27 MED ORDER — METHYLPREDNISOLONE 4 MG PO TBPK: ORAL_TABLET | ORAL | 0 refills | Status: AC

## 2024-07-27 NOTE — Telephone Encounter (Signed)
 patient calling saying she is having a lot of inflammation and pain in joints making it hard to move around.   Discussed with MD/NP, medrol  dose pak ordered and sent to patients pharmacy, pt informed and was grateful for the help.

## 2024-07-29 ENCOUNTER — Ambulatory Visit: Payer: Self-pay

## 2024-07-29 DIAGNOSIS — Z5181 Encounter for therapeutic drug level monitoring: Secondary | ICD-10-CM

## 2024-07-29 DIAGNOSIS — I480 Paroxysmal atrial fibrillation: Secondary | ICD-10-CM

## 2024-07-29 DIAGNOSIS — Z953 Presence of xenogenic heart valve: Secondary | ICD-10-CM

## 2024-07-29 DIAGNOSIS — Z952 Presence of prosthetic heart valve: Secondary | ICD-10-CM

## 2024-07-29 LAB — POCT INR: INR: 2.3 (ref 2.0–3.0)

## 2024-07-29 NOTE — Patient Instructions (Signed)
 Continue taking warfarin 1/2 tablet daily except for 1 tablet on Tuesdays, Thursdays, and Saturdays.  Recheck INR in 6 weeks.  Coumadin  Clinic [phone]

## 2024-07-29 NOTE — Progress Notes (Signed)
 INR 2.3  Continue taking warfarin 1/2 tablet daily except for 1 tablet on Tuesdays, Thursdays, and Saturdays.  Recheck INR in 6 weeks.  Coumadin  Clinic [phone]

## 2024-08-02 ENCOUNTER — Inpatient Hospital Stay: Payer: Self-pay

## 2024-08-02 ENCOUNTER — Inpatient Hospital Stay: Payer: Self-pay | Admitting: Hematology & Oncology

## 2024-09-09 ENCOUNTER — Ambulatory Visit: Payer: Self-pay
